# Patient Record
Sex: Female | Born: 1940
Health system: Southern US, Community
[De-identification: ages and names within clinical notes are randomized; demographics above are authoritative.]

## PROBLEM LIST (undated history)

## (undated) DIAGNOSIS — Z923 Personal history of irradiation: Secondary | ICD-10-CM

## (undated) DIAGNOSIS — K589 Irritable bowel syndrome without diarrhea: Secondary | ICD-10-CM

## (undated) DIAGNOSIS — I517 Cardiomegaly: Secondary | ICD-10-CM

## (undated) DIAGNOSIS — I8390 Asymptomatic varicose veins of unspecified lower extremity: Secondary | ICD-10-CM

## (undated) DIAGNOSIS — K449 Diaphragmatic hernia without obstruction or gangrene: Secondary | ICD-10-CM

## (undated) DIAGNOSIS — N182 Chronic kidney disease, stage 2 (mild): Secondary | ICD-10-CM

## (undated) DIAGNOSIS — K219 Gastro-esophageal reflux disease without esophagitis: Secondary | ICD-10-CM

## (undated) DIAGNOSIS — M542 Cervicalgia: Secondary | ICD-10-CM

## (undated) DIAGNOSIS — I493 Ventricular premature depolarization: Secondary | ICD-10-CM

## (undated) DIAGNOSIS — G20 Parkinson's disease: Secondary | ICD-10-CM

## (undated) DIAGNOSIS — Z853 Personal history of malignant neoplasm of breast: Secondary | ICD-10-CM

## (undated) DIAGNOSIS — G8929 Other chronic pain: Secondary | ICD-10-CM

## (undated) DIAGNOSIS — Z86010 Personal history of colonic polyps: Secondary | ICD-10-CM

## (undated) DIAGNOSIS — I447 Left bundle-branch block, unspecified: Secondary | ICD-10-CM

## (undated) DIAGNOSIS — N393 Stress incontinence (female) (male): Secondary | ICD-10-CM

## (undated) DIAGNOSIS — Z9889 Other specified postprocedural states: Secondary | ICD-10-CM

## (undated) DIAGNOSIS — K5730 Diverticulosis of large intestine without perforation or abscess without bleeding: Secondary | ICD-10-CM

## (undated) DIAGNOSIS — R7989 Other specified abnormal findings of blood chemistry: Secondary | ICD-10-CM

## (undated) DIAGNOSIS — C801 Malignant (primary) neoplasm, unspecified: Secondary | ICD-10-CM

## (undated) DIAGNOSIS — Z9181 History of falling: Secondary | ICD-10-CM

## (undated) DIAGNOSIS — Z87442 Personal history of urinary calculi: Secondary | ICD-10-CM

## (undated) DIAGNOSIS — I10 Essential (primary) hypertension: Secondary | ICD-10-CM

## (undated) DIAGNOSIS — I480 Paroxysmal atrial fibrillation: Secondary | ICD-10-CM

## (undated) DIAGNOSIS — M47812 Spondylosis without myelopathy or radiculopathy, cervical region: Secondary | ICD-10-CM

## (undated) DIAGNOSIS — R112 Nausea with vomiting, unspecified: Secondary | ICD-10-CM

## (undated) DIAGNOSIS — H8110 Benign paroxysmal vertigo, unspecified ear: Secondary | ICD-10-CM

## (undated) DIAGNOSIS — Z9221 Personal history of antineoplastic chemotherapy: Secondary | ICD-10-CM

## (undated) DIAGNOSIS — Z8619 Personal history of other infectious and parasitic diseases: Secondary | ICD-10-CM

## (undated) DIAGNOSIS — Z8679 Personal history of other diseases of the circulatory system: Secondary | ICD-10-CM

## (undated) DIAGNOSIS — G20C Parkinsonism, unspecified: Secondary | ICD-10-CM

## (undated) DIAGNOSIS — Z952 Presence of prosthetic heart valve: Secondary | ICD-10-CM

## (undated) DIAGNOSIS — R778 Other specified abnormalities of plasma proteins: Secondary | ICD-10-CM

## (undated) DIAGNOSIS — N201 Calculus of ureter: Secondary | ICD-10-CM

## (undated) DIAGNOSIS — D649 Anemia, unspecified: Secondary | ICD-10-CM

## (undated) DIAGNOSIS — E785 Hyperlipidemia, unspecified: Secondary | ICD-10-CM

## (undated) HISTORY — DX: Gastro-esophageal reflux disease without esophagitis: K21.9

## (undated) HISTORY — DX: Other specified abnormalities of plasma proteins: R77.8

## (undated) HISTORY — PX: CARDIOVASCULAR STRESS TEST: SHX262

## (undated) HISTORY — DX: Irritable bowel syndrome, unspecified: K58.9

## (undated) HISTORY — DX: Other specified abnormal findings of blood chemistry: R79.89

## (undated) HISTORY — PX: BREAST EXCISIONAL BIOPSY: SUR124

## (undated) HISTORY — DX: Essential (primary) hypertension: I10

## (undated) HISTORY — DX: Asymptomatic varicose veins of unspecified lower extremity: I83.90

## (undated) HISTORY — PX: CARDIAC CATHETERIZATION: SHX172

## (undated) HISTORY — PX: TRANSTHORACIC ECHOCARDIOGRAM: SHX275

## (undated) HISTORY — DX: Personal history of urinary calculi: Z87.442

## (undated) HISTORY — PX: AORTIC VALVE REPLACEMENT: SHX41

## (undated) HISTORY — DX: Cardiomegaly: I51.7

## (undated) HISTORY — PX: APPENDECTOMY: SHX54

## (undated) HISTORY — PX: CARPAL TUNNEL RELEASE: SHX101

## (undated) HISTORY — DX: Ventricular premature depolarization: I49.3

---

## 1969-12-27 HISTORY — PX: TOTAL ABDOMINAL HYSTERECTOMY: SHX209

## 1988-12-27 HISTORY — PX: ANTERIOR AND POSTERIOR REPAIR: SHX1172

## 1991-12-28 HISTORY — PX: PERCUTANEOUS NEPHROSTOLITHOTOMY: SHX2207

## 1995-12-28 HISTORY — PX: EXTRACORPOREAL SHOCK WAVE LITHOTRIPSY: SHX1557

## 1998-09-24 ENCOUNTER — Other Ambulatory Visit: Admission: RE | Admit: 1998-09-24 | Discharge: 1998-09-24 | Payer: Self-pay | Admitting: Obstetrics and Gynecology

## 1999-10-05 ENCOUNTER — Other Ambulatory Visit: Admission: RE | Admit: 1999-10-05 | Discharge: 1999-10-05 | Payer: Self-pay | Admitting: Obstetrics and Gynecology

## 2000-10-31 ENCOUNTER — Other Ambulatory Visit: Admission: RE | Admit: 2000-10-31 | Discharge: 2000-10-31 | Payer: Self-pay | Admitting: Obstetrics and Gynecology

## 2000-11-22 ENCOUNTER — Encounter: Admission: RE | Admit: 2000-11-22 | Discharge: 2000-11-22 | Payer: Self-pay | Admitting: *Deleted

## 2001-02-21 ENCOUNTER — Encounter: Payer: Self-pay | Admitting: Emergency Medicine

## 2001-02-21 ENCOUNTER — Encounter: Payer: Self-pay | Admitting: Cardiology

## 2001-02-21 ENCOUNTER — Inpatient Hospital Stay (HOSPITAL_COMMUNITY): Admission: EM | Admit: 2001-02-21 | Discharge: 2001-02-22 | Payer: Self-pay | Admitting: Emergency Medicine

## 2001-05-01 ENCOUNTER — Encounter: Payer: Self-pay | Admitting: Obstetrics and Gynecology

## 2001-05-01 ENCOUNTER — Encounter: Admission: RE | Admit: 2001-05-01 | Discharge: 2001-05-01 | Payer: Self-pay | Admitting: General Surgery

## 2001-11-06 ENCOUNTER — Other Ambulatory Visit: Admission: RE | Admit: 2001-11-06 | Discharge: 2001-11-06 | Payer: Self-pay | Admitting: Obstetrics and Gynecology

## 2002-12-26 ENCOUNTER — Other Ambulatory Visit: Admission: RE | Admit: 2002-12-26 | Discharge: 2002-12-26 | Payer: Self-pay | Admitting: Obstetrics and Gynecology

## 2002-12-27 DIAGNOSIS — Z9221 Personal history of antineoplastic chemotherapy: Secondary | ICD-10-CM

## 2002-12-27 DIAGNOSIS — C801 Malignant (primary) neoplasm, unspecified: Secondary | ICD-10-CM

## 2002-12-27 DIAGNOSIS — Z923 Personal history of irradiation: Secondary | ICD-10-CM

## 2002-12-27 HISTORY — DX: Malignant (primary) neoplasm, unspecified: C80.1

## 2002-12-27 HISTORY — DX: Personal history of irradiation: Z92.3

## 2002-12-27 HISTORY — PX: BREAST SURGERY: SHX581

## 2002-12-27 HISTORY — DX: Personal history of antineoplastic chemotherapy: Z92.21

## 2002-12-31 ENCOUNTER — Encounter (INDEPENDENT_AMBULATORY_CARE_PROVIDER_SITE_OTHER): Payer: Self-pay | Admitting: *Deleted

## 2002-12-31 ENCOUNTER — Encounter: Admission: RE | Admit: 2002-12-31 | Discharge: 2002-12-31 | Payer: Self-pay | Admitting: Obstetrics and Gynecology

## 2002-12-31 ENCOUNTER — Encounter: Payer: Self-pay | Admitting: Obstetrics and Gynecology

## 2003-01-11 ENCOUNTER — Encounter (INDEPENDENT_AMBULATORY_CARE_PROVIDER_SITE_OTHER): Payer: Self-pay | Admitting: Specialist

## 2003-01-11 ENCOUNTER — Ambulatory Visit (HOSPITAL_BASED_OUTPATIENT_CLINIC_OR_DEPARTMENT_OTHER): Admission: RE | Admit: 2003-01-11 | Discharge: 2003-01-11 | Payer: Self-pay | Admitting: *Deleted

## 2003-01-11 HISTORY — PX: BREAST LUMPECTOMY: SHX2

## 2003-01-17 ENCOUNTER — Ambulatory Visit: Admission: RE | Admit: 2003-01-17 | Discharge: 2003-02-12 | Payer: Self-pay | Admitting: Radiation Oncology

## 2003-01-25 ENCOUNTER — Encounter: Payer: Self-pay | Admitting: Oncology

## 2003-01-25 ENCOUNTER — Ambulatory Visit (HOSPITAL_COMMUNITY): Admission: RE | Admit: 2003-01-25 | Discharge: 2003-01-25 | Payer: Self-pay | Admitting: Oncology

## 2003-01-30 ENCOUNTER — Ambulatory Visit: Admission: RE | Admit: 2003-01-30 | Discharge: 2003-01-30 | Payer: Self-pay | Admitting: Oncology

## 2003-01-30 ENCOUNTER — Encounter: Payer: Self-pay | Admitting: Cardiology

## 2003-02-04 ENCOUNTER — Ambulatory Visit (HOSPITAL_BASED_OUTPATIENT_CLINIC_OR_DEPARTMENT_OTHER): Admission: RE | Admit: 2003-02-04 | Discharge: 2003-02-04 | Payer: Self-pay | Admitting: *Deleted

## 2003-03-29 ENCOUNTER — Inpatient Hospital Stay (HOSPITAL_COMMUNITY): Admission: EM | Admit: 2003-03-29 | Discharge: 2003-04-02 | Payer: Self-pay | Admitting: Oncology

## 2003-03-29 ENCOUNTER — Encounter: Payer: Self-pay | Admitting: Oncology

## 2003-04-22 ENCOUNTER — Ambulatory Visit: Admission: RE | Admit: 2003-04-22 | Discharge: 2003-06-26 | Payer: Self-pay | Admitting: Radiation Oncology

## 2003-05-29 ENCOUNTER — Ambulatory Visit (HOSPITAL_BASED_OUTPATIENT_CLINIC_OR_DEPARTMENT_OTHER): Admission: RE | Admit: 2003-05-29 | Discharge: 2003-05-29 | Payer: Self-pay | Admitting: *Deleted

## 2003-07-29 ENCOUNTER — Ambulatory Visit: Admission: RE | Admit: 2003-07-29 | Discharge: 2003-07-29 | Payer: Self-pay | Admitting: Radiation Oncology

## 2003-07-30 ENCOUNTER — Encounter: Admission: RE | Admit: 2003-07-30 | Discharge: 2003-07-30 | Payer: Self-pay | Admitting: Oncology

## 2003-07-30 ENCOUNTER — Encounter: Payer: Self-pay | Admitting: Oncology

## 2003-09-05 ENCOUNTER — Encounter: Admission: RE | Admit: 2003-09-05 | Discharge: 2003-09-05 | Payer: Self-pay | Admitting: *Deleted

## 2003-11-27 ENCOUNTER — Encounter: Admission: RE | Admit: 2003-11-27 | Discharge: 2003-11-27 | Payer: Self-pay | Admitting: Oncology

## 2004-01-02 ENCOUNTER — Other Ambulatory Visit: Admission: RE | Admit: 2004-01-02 | Discharge: 2004-01-02 | Payer: Self-pay | Admitting: Obstetrics and Gynecology

## 2004-02-21 ENCOUNTER — Encounter (INDEPENDENT_AMBULATORY_CARE_PROVIDER_SITE_OTHER): Payer: Self-pay | Admitting: Specialist

## 2004-02-21 ENCOUNTER — Ambulatory Visit (HOSPITAL_COMMUNITY): Admission: RE | Admit: 2004-02-21 | Discharge: 2004-02-21 | Payer: Self-pay | Admitting: *Deleted

## 2004-02-21 ENCOUNTER — Ambulatory Visit (HOSPITAL_BASED_OUTPATIENT_CLINIC_OR_DEPARTMENT_OTHER): Admission: RE | Admit: 2004-02-21 | Discharge: 2004-02-21 | Payer: Self-pay | Admitting: *Deleted

## 2004-09-09 ENCOUNTER — Ambulatory Visit (HOSPITAL_COMMUNITY): Admission: RE | Admit: 2004-09-09 | Discharge: 2004-09-09 | Payer: Self-pay | Admitting: Orthopedic Surgery

## 2004-09-09 ENCOUNTER — Ambulatory Visit: Admission: RE | Admit: 2004-09-09 | Discharge: 2004-09-09 | Payer: Self-pay | Admitting: Orthopedic Surgery

## 2004-10-26 ENCOUNTER — Ambulatory Visit: Payer: Self-pay | Admitting: Oncology

## 2004-10-27 ENCOUNTER — Ambulatory Visit: Payer: Self-pay | Admitting: Oncology

## 2004-12-08 ENCOUNTER — Ambulatory Visit (HOSPITAL_COMMUNITY): Admission: RE | Admit: 2004-12-08 | Discharge: 2004-12-08 | Payer: Self-pay | Admitting: Oncology

## 2004-12-24 ENCOUNTER — Encounter: Admission: RE | Admit: 2004-12-24 | Discharge: 2004-12-24 | Payer: Self-pay | Admitting: Oncology

## 2005-02-02 ENCOUNTER — Ambulatory Visit: Payer: Self-pay | Admitting: Gastroenterology

## 2005-02-10 ENCOUNTER — Ambulatory Visit: Payer: Self-pay | Admitting: Gastroenterology

## 2005-05-04 ENCOUNTER — Ambulatory Visit: Payer: Self-pay | Admitting: Oncology

## 2005-07-14 ENCOUNTER — Encounter: Admission: RE | Admit: 2005-07-14 | Discharge: 2005-07-14 | Payer: Self-pay | Admitting: Internal Medicine

## 2005-11-01 ENCOUNTER — Ambulatory Visit: Payer: Self-pay | Admitting: Oncology

## 2005-12-28 ENCOUNTER — Encounter: Admission: RE | Admit: 2005-12-28 | Discharge: 2005-12-28 | Payer: Self-pay | Admitting: Oncology

## 2006-03-22 ENCOUNTER — Inpatient Hospital Stay (HOSPITAL_BASED_OUTPATIENT_CLINIC_OR_DEPARTMENT_OTHER): Admission: RE | Admit: 2006-03-22 | Discharge: 2006-03-22 | Payer: Self-pay | Admitting: Cardiology

## 2006-03-24 ENCOUNTER — Other Ambulatory Visit: Admission: RE | Admit: 2006-03-24 | Discharge: 2006-03-24 | Payer: Self-pay | Admitting: Obstetrics and Gynecology

## 2006-04-07 ENCOUNTER — Encounter: Admission: RE | Admit: 2006-04-07 | Discharge: 2006-04-07 | Payer: Self-pay | Admitting: Cardiothoracic Surgery

## 2006-04-13 ENCOUNTER — Encounter (INDEPENDENT_AMBULATORY_CARE_PROVIDER_SITE_OTHER): Payer: Self-pay | Admitting: *Deleted

## 2006-04-13 ENCOUNTER — Inpatient Hospital Stay (HOSPITAL_COMMUNITY): Admission: RE | Admit: 2006-04-13 | Discharge: 2006-04-21 | Payer: Self-pay | Admitting: Cardiothoracic Surgery

## 2006-04-29 ENCOUNTER — Ambulatory Visit: Payer: Self-pay | Admitting: Oncology

## 2006-05-12 ENCOUNTER — Encounter (HOSPITAL_COMMUNITY): Admission: RE | Admit: 2006-05-12 | Discharge: 2006-08-10 | Payer: Self-pay | Admitting: Cardiology

## 2006-07-11 ENCOUNTER — Emergency Department (HOSPITAL_COMMUNITY): Admission: EM | Admit: 2006-07-11 | Discharge: 2006-07-11 | Payer: Self-pay | Admitting: Emergency Medicine

## 2006-08-11 ENCOUNTER — Encounter (HOSPITAL_COMMUNITY): Admission: RE | Admit: 2006-08-11 | Discharge: 2006-09-23 | Payer: Self-pay | Admitting: Cardiology

## 2006-10-28 ENCOUNTER — Ambulatory Visit: Payer: Self-pay | Admitting: Oncology

## 2006-11-01 LAB — CBC WITH DIFFERENTIAL/PLATELET
BASO%: 0.9 % (ref 0.0–2.0)
Basophils Absolute: 0 10*3/uL (ref 0.0–0.1)
EOS%: 4.6 % (ref 0.0–7.0)
Eosinophils Absolute: 0.2 10*3/uL (ref 0.0–0.5)
HCT: 35 % (ref 34.8–46.6)
HGB: 11.9 g/dL (ref 11.6–15.9)
LYMPH%: 24.1 % (ref 14.0–48.0)
MCH: 35.1 pg — ABNORMAL HIGH (ref 26.0–34.0)
MCHC: 34.2 g/dL (ref 32.0–36.0)
MCV: 102.7 fL — ABNORMAL HIGH (ref 81.0–101.0)
MONO#: 0.4 10*3/uL (ref 0.1–0.9)
MONO%: 9 % (ref 0.0–13.0)
NEUT#: 2.7 10*3/uL (ref 1.5–6.5)
NEUT%: 61.4 % (ref 39.6–76.8)
Platelets: 192 10*3/uL (ref 145–400)
RBC: 3.4 10*6/uL — ABNORMAL LOW (ref 3.70–5.32)
RDW: 13.7 % (ref 11.3–14.5)
WBC: 4.4 10*3/uL (ref 3.9–10.0)
lymph#: 1.1 10*3/uL (ref 0.9–3.3)

## 2006-11-01 LAB — COMPREHENSIVE METABOLIC PANEL
ALT: 40 U/L — ABNORMAL HIGH (ref 0–35)
AST: 43 U/L — ABNORMAL HIGH (ref 0–37)
Albumin: 3.8 g/dL (ref 3.5–5.2)
Alkaline Phosphatase: 73 U/L (ref 39–117)
BUN: 20 mg/dL (ref 6–23)
CO2: 29 mEq/L (ref 19–32)
Calcium: 8.8 mg/dL (ref 8.4–10.5)
Chloride: 105 mEq/L (ref 96–112)
Creatinine, Ser: 1.29 mg/dL — ABNORMAL HIGH (ref 0.40–1.20)
Glucose, Bld: 94 mg/dL (ref 70–99)
Potassium: 3.9 mEq/L (ref 3.5–5.3)
Sodium: 141 mEq/L (ref 135–145)
Total Bilirubin: 0.5 mg/dL (ref 0.3–1.2)
Total Protein: 6.2 g/dL (ref 6.0–8.3)

## 2006-11-01 LAB — CANCER ANTIGEN 27.29: CA 27.29: 19 U/mL (ref 0–39)

## 2007-01-02 ENCOUNTER — Encounter: Admission: RE | Admit: 2007-01-02 | Discharge: 2007-01-02 | Payer: Self-pay | Admitting: Oncology

## 2007-04-06 ENCOUNTER — Encounter: Admission: RE | Admit: 2007-04-06 | Discharge: 2007-04-06 | Payer: Self-pay | Admitting: Internal Medicine

## 2007-04-06 ENCOUNTER — Encounter (INDEPENDENT_AMBULATORY_CARE_PROVIDER_SITE_OTHER): Payer: Self-pay | Admitting: Specialist

## 2007-04-20 ENCOUNTER — Encounter: Admission: RE | Admit: 2007-04-20 | Discharge: 2007-04-20 | Payer: Self-pay | Admitting: *Deleted

## 2007-04-24 ENCOUNTER — Ambulatory Visit: Payer: Self-pay | Admitting: Oncology

## 2007-04-27 LAB — CANCER ANTIGEN 27.29: CA 27.29: 22 U/mL (ref 0–39)

## 2007-04-27 LAB — COMPREHENSIVE METABOLIC PANEL
ALT: 17 U/L (ref 0–35)
AST: 21 U/L (ref 0–37)
Albumin: 3.9 g/dL (ref 3.5–5.2)
Alkaline Phosphatase: 76 U/L (ref 39–117)
BUN: 27 mg/dL — ABNORMAL HIGH (ref 6–23)
CO2: 27 mEq/L (ref 19–32)
Calcium: 9.1 mg/dL (ref 8.4–10.5)
Chloride: 102 mEq/L (ref 96–112)
Creatinine, Ser: 1.07 mg/dL (ref 0.40–1.20)
Glucose, Bld: 84 mg/dL (ref 70–99)
Potassium: 3.9 mEq/L (ref 3.5–5.3)
Sodium: 137 mEq/L (ref 135–145)
Total Bilirubin: 0.5 mg/dL (ref 0.3–1.2)
Total Protein: 6.3 g/dL (ref 6.0–8.3)

## 2007-04-27 LAB — CBC WITH DIFFERENTIAL/PLATELET
BASO%: 0.6 % (ref 0.0–2.0)
Basophils Absolute: 0 10*3/uL (ref 0.0–0.1)
EOS%: 2.3 % (ref 0.0–7.0)
Eosinophils Absolute: 0.1 10*3/uL (ref 0.0–0.5)
HCT: 34.1 % — ABNORMAL LOW (ref 34.8–46.6)
HGB: 12 g/dL (ref 11.6–15.9)
LYMPH%: 25.8 % (ref 14.0–48.0)
MCH: 35.4 pg — ABNORMAL HIGH (ref 26.0–34.0)
MCHC: 35.2 g/dL (ref 32.0–36.0)
MCV: 100.5 fL (ref 81.0–101.0)
MONO#: 0.4 10*3/uL (ref 0.1–0.9)
MONO%: 9 % (ref 0.0–13.0)
NEUT#: 2.7 10*3/uL (ref 1.5–6.5)
NEUT%: 62.3 % (ref 39.6–76.8)
Platelets: 179 10*3/uL (ref 145–400)
RBC: 3.4 10*6/uL — ABNORMAL LOW (ref 3.70–5.32)
RDW: 14 % (ref 11.3–14.5)
WBC: 4.4 10*3/uL (ref 3.9–10.0)
lymph#: 1.1 10*3/uL (ref 0.9–3.3)

## 2007-10-24 ENCOUNTER — Ambulatory Visit: Payer: Self-pay | Admitting: Oncology

## 2007-10-26 LAB — CBC WITH DIFFERENTIAL/PLATELET
BASO%: 1.3 % (ref 0.0–2.0)
Basophils Absolute: 0.1 10*3/uL (ref 0.0–0.1)
EOS%: 5.7 % (ref 0.0–7.0)
Eosinophils Absolute: 0.2 10*3/uL (ref 0.0–0.5)
HCT: 33.9 % — ABNORMAL LOW (ref 34.8–46.6)
HGB: 12.1 g/dL (ref 11.6–15.9)
LYMPH%: 18.7 % (ref 14.0–48.0)
MCH: 35.6 pg — ABNORMAL HIGH (ref 26.0–34.0)
MCHC: 35.9 g/dL (ref 32.0–36.0)
MCV: 99.3 fL (ref 81.0–101.0)
MONO#: 0.3 10*3/uL (ref 0.1–0.9)
MONO%: 8 % (ref 0.0–13.0)
NEUT#: 2.6 10*3/uL (ref 1.5–6.5)
NEUT%: 66.3 % (ref 39.6–76.8)
Platelets: 175 10*3/uL (ref 145–400)
RBC: 3.41 10*6/uL — ABNORMAL LOW (ref 3.70–5.32)
RDW: 13.5 % (ref 11.3–14.5)
WBC: 3.9 10*3/uL (ref 3.9–10.0)
lymph#: 0.7 10*3/uL — ABNORMAL LOW (ref 0.9–3.3)

## 2007-10-27 LAB — COMPREHENSIVE METABOLIC PANEL
ALT: 18 U/L (ref 0–35)
AST: 25 U/L (ref 0–37)
Albumin: 4.1 g/dL (ref 3.5–5.2)
Alkaline Phosphatase: 70 U/L (ref 39–117)
BUN: 24 mg/dL — ABNORMAL HIGH (ref 6–23)
CO2: 24 mEq/L (ref 19–32)
Calcium: 9.4 mg/dL (ref 8.4–10.5)
Chloride: 101 mEq/L (ref 96–112)
Creatinine, Ser: 1.17 mg/dL (ref 0.40–1.20)
Glucose, Bld: 78 mg/dL (ref 70–99)
Potassium: 3.9 mEq/L (ref 3.5–5.3)
Sodium: 137 mEq/L (ref 135–145)
Total Bilirubin: 0.6 mg/dL (ref 0.3–1.2)
Total Protein: 6.5 g/dL (ref 6.0–8.3)

## 2007-10-27 LAB — CANCER ANTIGEN 27.29: CA 27.29: 23 U/mL (ref 0–39)

## 2007-10-27 LAB — VITAMIN B12: Vitamin B-12: 611 pg/mL (ref 211–911)

## 2007-10-27 LAB — FOLATE RBC: RBC Folate: 917 ng/mL — ABNORMAL HIGH (ref 180–600)

## 2007-11-16 ENCOUNTER — Encounter: Payer: Self-pay | Admitting: Internal Medicine

## 2008-03-04 ENCOUNTER — Ambulatory Visit: Payer: Self-pay | Admitting: Oncology

## 2008-03-06 ENCOUNTER — Ambulatory Visit (HOSPITAL_COMMUNITY): Admission: RE | Admit: 2008-03-06 | Discharge: 2008-03-06 | Payer: Self-pay | Admitting: Oncology

## 2008-03-06 LAB — COMPREHENSIVE METABOLIC PANEL
ALT: 19 U/L (ref 0–35)
AST: 26 U/L (ref 0–37)
Albumin: 3.5 g/dL (ref 3.5–5.2)
Alkaline Phosphatase: 57 U/L (ref 39–117)
BUN: 15 mg/dL (ref 6–23)
CO2: 30 mEq/L (ref 19–32)
Calcium: 9.4 mg/dL (ref 8.4–10.5)
Chloride: 96 mEq/L (ref 96–112)
Creatinine, Ser: 1.07 mg/dL (ref 0.40–1.20)
Glucose, Bld: 88 mg/dL (ref 70–99)
Potassium: 4.2 mEq/L (ref 3.5–5.3)
Sodium: 132 mEq/L — ABNORMAL LOW (ref 135–145)
Total Bilirubin: 1.1 mg/dL (ref 0.3–1.2)
Total Protein: 6.2 g/dL (ref 6.0–8.3)

## 2008-03-06 LAB — CBC WITH DIFFERENTIAL/PLATELET
BASO%: 0.5 % (ref 0.0–2.0)
Basophils Absolute: 0 10*3/uL (ref 0.0–0.1)
EOS%: 2.4 % (ref 0.0–7.0)
Eosinophils Absolute: 0.1 10*3/uL (ref 0.0–0.5)
HCT: 35.6 % (ref 34.8–46.6)
HGB: 12.5 g/dL (ref 11.6–15.9)
LYMPH%: 18.1 % (ref 14.0–48.0)
MCH: 34.5 pg — ABNORMAL HIGH (ref 26.0–34.0)
MCHC: 35.1 g/dL (ref 32.0–36.0)
MCV: 98.3 fL (ref 81.0–101.0)
MONO#: 0.4 10*3/uL (ref 0.1–0.9)
MONO%: 8 % (ref 0.0–13.0)
NEUT#: 3.4 10*3/uL (ref 1.5–6.5)
NEUT%: 71 % (ref 39.6–76.8)
Platelets: 164 10*3/uL (ref 145–400)
RBC: 3.62 10*6/uL — ABNORMAL LOW (ref 3.70–5.32)
RDW: 12.8 % (ref 11.3–14.5)
WBC: 4.8 10*3/uL (ref 3.9–10.0)
lymph#: 0.9 10*3/uL (ref 0.9–3.3)

## 2008-03-06 LAB — CANCER ANTIGEN 27.29: CA 27.29: 24 U/mL (ref 0–39)

## 2008-03-07 LAB — VITAMIN D 25 HYDROXY (VIT D DEFICIENCY, FRACTURES): Vit D, 25-Hydroxy: 32 ng/mL (ref 30–89)

## 2008-03-11 LAB — VITAMIN D 1,25 DIHYDROXY: Vit D, 1,25-Dihydroxy: 56 pg/mL (ref 6–62)

## 2008-04-17 ENCOUNTER — Encounter: Admission: RE | Admit: 2008-04-17 | Discharge: 2008-04-17 | Payer: Self-pay | Admitting: Oncology

## 2008-04-24 ENCOUNTER — Ambulatory Visit: Payer: Self-pay | Admitting: Oncology

## 2008-04-25 ENCOUNTER — Encounter: Admission: RE | Admit: 2008-04-25 | Discharge: 2008-04-25 | Payer: Self-pay | Admitting: Oncology

## 2008-10-08 ENCOUNTER — Encounter: Admission: RE | Admit: 2008-10-08 | Discharge: 2008-10-08 | Payer: Self-pay | Admitting: Oncology

## 2009-04-18 ENCOUNTER — Encounter: Admission: RE | Admit: 2009-04-18 | Discharge: 2009-04-18 | Payer: Self-pay | Admitting: *Deleted

## 2009-09-03 ENCOUNTER — Inpatient Hospital Stay (HOSPITAL_COMMUNITY): Admission: EM | Admit: 2009-09-03 | Discharge: 2009-09-04 | Payer: Self-pay | Admitting: Emergency Medicine

## 2010-01-02 ENCOUNTER — Encounter (INDEPENDENT_AMBULATORY_CARE_PROVIDER_SITE_OTHER): Payer: Self-pay | Admitting: *Deleted

## 2010-02-06 ENCOUNTER — Encounter (INDEPENDENT_AMBULATORY_CARE_PROVIDER_SITE_OTHER): Payer: Self-pay | Admitting: *Deleted

## 2010-02-06 ENCOUNTER — Ambulatory Visit: Payer: Self-pay | Admitting: Gastroenterology

## 2010-02-06 DIAGNOSIS — K5730 Diverticulosis of large intestine without perforation or abscess without bleeding: Secondary | ICD-10-CM | POA: Insufficient documentation

## 2010-02-06 DIAGNOSIS — Z860100 Personal history of colon polyps, unspecified: Secondary | ICD-10-CM | POA: Insufficient documentation

## 2010-02-06 DIAGNOSIS — K219 Gastro-esophageal reflux disease without esophagitis: Secondary | ICD-10-CM | POA: Insufficient documentation

## 2010-02-06 DIAGNOSIS — Z9010 Acquired absence of unspecified breast and nipple: Secondary | ICD-10-CM | POA: Insufficient documentation

## 2010-02-09 ENCOUNTER — Telehealth: Payer: Self-pay | Admitting: Gastroenterology

## 2010-02-20 ENCOUNTER — Ambulatory Visit: Payer: Self-pay | Admitting: Gastroenterology

## 2010-04-30 ENCOUNTER — Encounter: Admission: RE | Admit: 2010-04-30 | Discharge: 2010-04-30 | Payer: Self-pay | Admitting: Internal Medicine

## 2010-05-12 ENCOUNTER — Encounter: Admission: RE | Admit: 2010-05-12 | Discharge: 2010-05-12 | Payer: Self-pay | Admitting: General Surgery

## 2010-06-18 ENCOUNTER — Telehealth: Payer: Self-pay | Admitting: Gastroenterology

## 2010-08-11 ENCOUNTER — Ambulatory Visit: Payer: Self-pay | Admitting: Cardiology

## 2010-09-08 ENCOUNTER — Ambulatory Visit: Payer: Self-pay | Admitting: Cardiology

## 2010-09-15 ENCOUNTER — Ambulatory Visit: Payer: Self-pay | Admitting: Cardiology

## 2010-10-06 ENCOUNTER — Ambulatory Visit: Payer: Self-pay | Admitting: Cardiology

## 2010-11-03 ENCOUNTER — Ambulatory Visit: Payer: Self-pay | Admitting: Cardiovascular Disease

## 2010-11-06 ENCOUNTER — Ambulatory Visit: Payer: Self-pay | Admitting: Cardiology

## 2010-11-13 ENCOUNTER — Ambulatory Visit: Payer: Self-pay | Admitting: Cardiology

## 2010-11-27 ENCOUNTER — Ambulatory Visit: Payer: Self-pay | Admitting: Cardiology

## 2010-12-01 ENCOUNTER — Ambulatory Visit: Payer: Self-pay | Admitting: Cardiovascular Disease

## 2010-12-08 ENCOUNTER — Ambulatory Visit: Payer: Self-pay | Admitting: Cardiology

## 2010-12-15 ENCOUNTER — Ambulatory Visit: Payer: Self-pay | Admitting: Cardiology

## 2010-12-23 ENCOUNTER — Ambulatory Visit: Payer: Self-pay | Admitting: Cardiology

## 2010-12-30 ENCOUNTER — Ambulatory Visit: Payer: Self-pay | Admitting: Cardiology

## 2011-01-07 ENCOUNTER — Ambulatory Visit: Payer: Self-pay | Admitting: Cardiology

## 2011-01-15 ENCOUNTER — Ambulatory Visit: Payer: Self-pay | Admitting: Cardiology

## 2011-01-17 ENCOUNTER — Encounter: Payer: Self-pay | Admitting: Oncology

## 2011-01-26 ENCOUNTER — Encounter: Payer: Self-pay | Admitting: Cardiology

## 2011-01-26 ENCOUNTER — Ambulatory Visit: Payer: Self-pay | Admitting: Cardiology

## 2011-01-28 NOTE — Procedures (Signed)
Summary: Colonoscopy  Patient: Cherelle Renner Note: All result statuses are Final unless otherwise noted.  Tests: (1) Colonoscopy (COL)   COL Colonoscopy           [facility]     [place], [place]  [address]           COLONOSCOPY PROCEDURE REPORT           PATIENT:  Laconya, Fuell  MR#:  [id number]     BIRTHDATE:  12-14-1941, 68 yrs. old  GENDER:  female           ENDOSCOPIST:  Loralee Pacas. Sharlett Iles, MD, [facility]     Referred by:           PROCEDURE DATE:  02/20/2010     PROCEDURE:     ASA CLASS:  Class III     INDICATIONS:  history of pre-cancerous (adenomatous) colon polyps                 MEDICATIONS:   Fentanyl 50 mcg IV, Versed 6 mg IV           DESCRIPTION OF PROCEDURE:   After the risks benefits and     alternatives of the procedure were thoroughly explained, informed     consent was obtained.  Digital rectal exam was performed and     revealed no abnormalities.   The LB [id number] endoscope     was introduced through the anus and advanced to the cecum, which     was identified by both the appendix and ileocecal valve, without     limitations.  The quality of the prep was excellent, using     MoviPrep.  The instrument was then slowly withdrawn as the colon     was fully examined.     <<PROCEDUREIMAGES>>           FINDINGS:  Severe diverticulosis was found sigmoid to descending     No polyps or cancers were seen.  Internal hemorrhoids were found.     Retroflexed views in the rectum revealed internal hemorrhoids.     The scope was then withdrawn from the patient and the procedure     completed.           COMPLICATIONS:  None           ENDOSCOPIC IMPRESSION:     1) Severe diverticulosis in the sigmoid to descending     2) No polyps or cancers     3) Internal hemorrhoids     RECOMMENDATIONS:     1) High fiber diet.     RESUME ALL MEDS AND COUMADIN.           REPEAT EXAM:  No           ______________________________  Loralee Pacas. Sharlett Iles, MD, Marval Regal           CC:  Peter Martinique, MDDaniel Philip Aspen, MD           n.     Lorrin Mais:   Loralee Pacas. Sachin Ferencz at 02/20/2010 02:03 PM           Danie Chandler, [id number]  Note: An exclamation mark (!) indicates a result that was not dispersed into the flowsheet. Document Creation Date: 02/20/2010 2:03 PM _______________________________________________________________________  (1) Order result status: Final Collection or observation date-time: 02/20/2010 13:57 Requested date-time:  Receipt date-time:  Reported date-time:  Referring Physician:   Ordering Physician:  Verl Blalock [id number]) Specimen Source:  Source: Tawanna Cooler Order Number: [id number] Lab site:   Appended Document: Colonoscopy This colonoscopy was screening for high risk patient for colon cancer and polyps.

## 2011-01-28 NOTE — Letter (Signed)
Summary: [place] [facility]//NEUROLOGY RESULT  [facility]//NEUROLOGY RESULT   Imported By: Velora Heckler 11/21/2007 10:52:02  _____________________________________________________________________  External Attachment:    Type:   Image     Comment:   External Document

## 2011-01-28 NOTE — Letter (Signed)
Summary: [facility] Instructions  [facility]  [place], [place] [address]   Phone: [phone]  Fax: [phone]       [name]    Apr 17, 1941    MRN: [id number]        Procedure Day /Date: Friday, 02/20/10     Arrival Time: 12:30      Procedure Time: 1:30     Location of Procedure:                    [facility]  [place] (4th Floor)                         [place]   Starting 5 days prior to your procedure 02/15/10 do not eat nuts, seeds, popcorn, corn, beans, peas,  salads, or any raw vegetables.  Do not take any fiber supplements (e.g. Metamucil, Citrucel, and Benefiber).  THE DAY BEFORE YOUR PROCEDURE         DATE: 02/19/10  DAY: Thursday  1.  Drink clear liquids the entire day-NO SOLID FOOD  2.  Do not drink anything colored red or purple.  Avoid juices with pulp.  No orange juice.  3.  Drink at least 64 oz. (8 glasses) of fluid/clear liquids during the day to prevent dehydration and help the prep work efficiently.  CLEAR LIQUIDS INCLUDE: Water Jello Ice Popsicles Tea (sugar ok, no milk/cream) Powdered fruit flavored drinks Coffee (sugar ok, no milk/cream) Gatorade Juice: apple, white grape, white cranberry  Lemonade Clear bullion, consomm, broth Carbonated beverages (any kind) Strained chicken noodle soup Hard Candy                             4.  In the morning, mix first dose of MoviPrep solution:    Empty 1 Pouch A and 1 Pouch B into the disposable container    Add lukewarm drinking water to the top line of the container. Mix to dissolve    Refrigerate (mixed solution should be used within 24 hrs)  5.  Begin drinking the prep at 5:00 p.m. The MoviPrep container is divided by 4 marks.   Every 15 minutes drink the solution down to the next mark (approximately 8 oz) until the full liter is complete.   6.  Follow completed prep with 16 oz of clear liquid of your choice (Nothing  red or purple).  Continue to drink clear liquids until bedtime.  7.  Before going to bed, mix second dose of MoviPrep solution:    Empty 1 Pouch A and 1 Pouch B into the disposable container    Add lukewarm drinking water to the top line of the container. Mix to dissolve    Refrigerate  THE DAY OF YOUR PROCEDURE      DATE: 02/20/10  DAY: Friday  Beginning at 8:30 a.m. (5 hours before procedure):         1. Every 15 minutes, drink the solution down to the next mark (approx 8 oz) until the full liter is complete.  2. Follow completed prep with 16 oz. of clear liquid of your choice.    3. You may drink clear liquids until 11:30  (2 HOURS BEFORE PROCEDURE).   MEDICATION INSTRUCTIONS  Unless otherwise instructed, you should take regular prescription medications with a small sip of water   as early as possible the morning  of your procedure.   CHECK WITH DR. Martinique ABOUT COUMADIN          OTHER INSTRUCTIONS  You will need a responsible adult at least 71 years of age to accompany you and drive you home.   This person must remain in the waiting room during your procedure.  Wear loose fitting clothing that is easily removed.  Leave jewelry and other valuables at home.  However, you may wish to bring a book to read or  an iPod/MP3 player to listen to music as you wait for your procedure to start.  Remove all body piercing jewelry and leave at home.  Total time from sign-in until discharge is approximately 2-3 hours.  You should go home directly after your procedure and rest.  You can resume normal activities the  day after your procedure.  The day of your procedure you should not:   Drive   Make legal decisions   Operate machinery   Drink alcohol   Return to work  You will receive specific instructions about eating, activities and medications before you leave.    The above instructions have been reviewed and explained to me by   _______________________    I  fully understand and can verbalize these instructions _____________________________ Date _________

## 2011-01-28 NOTE — Progress Notes (Signed)
Summary: COL alternative?  Phone Note Call from Patient Call back at Home Phone [phone]   Caller: Patient Call For: Dr. Sharlett Iles Reason for Call: Talk to Nurse Summary of Call: pt has COL on 25th and has questions regarding Coumadin and if there is a different type of test that can be performed to screen for colon cancer Initial call taken by: Lucien Mons,  February 09, 2010 1:31 PM  Follow-up for Phone Call        Pt states that her husband is concerned about her having to take the lovenox injections and wants to know if pt can have the "scan" that Dr.Dare Sanger mentioned.  Pt does not have this same concern but she promised her husband she would call and ask.  Explained to pt that colonoscopy is the best screening test and polyps can be removed at the same time.  Pt states she will go ahead with the colonoscopy.   To Dr. Francoise Schaumann Follow-up by: Alberteen Spindle RN,  February 09, 2010 2:08 PM  Additional Follow-up for Phone Call Additional follow up Details #1::        good... Additional Follow-up by: Sable Feil MD Marval Regal,  February 09, 2010 2:12 PM

## 2011-01-28 NOTE — Assessment & Plan Note (Signed)
Summary: CONSULT BEFORE COL PT ON COUMADINE.Marland KitchenEM   History of Present Illness Visit Type: Follow-up Visit Primary GI MD: Verl Blalock MD FACP [place] Primary Provider: Ermalene Searing. Philip Aspen, MD  Requesting Provider: n/a Chief Complaint: Consult colon. Pt denies any GI complaints  History of Present Illness:   This patient is a 70 year old Caucasian female who has had a mechanical valve placed in her aortic valve in 2007 because of advancing aortic stenosis. Since that time she's been on aspirin and Coumadin which had been managed by Dr. Peter Martinique. She has a very strong family history of colon cancer in 2 brothers younger than age 71. She has had several colonoscopies with the last exam 5 years ago showing a right-sided adenoma that was excised. She also has rather extensive diverticulosis with alternating diarrhea and constipation. She has chronic gastroesophageal reflux disease which is managed with daily protonic. Previous endoscopic exams have not shown evidence of Barrett's mucosa.She has well-controlled hypertension and hyperlipidemia. She denies active cardiovascular or pulmonary complaints. Her appetite is good and her weight is stable. She does have the bruisability but denies other coagulation problems. Apparently her INR was checked yesterday and was 2.5. She takes Coumadin 2.5 mg 3 days a week and 5 mg 4 days a week.   GI Review of Systems      Denies abdominal pain, acid reflux, belching, bloating, chest pain, dysphagia with liquids, dysphagia with solids, heartburn, loss of appetite, nausea, vomiting, vomiting blood, weight loss, and  weight gain.        Denies anal fissure, black tarry stools, change in bowel habit, constipation, diarrhea, diverticulosis, fecal incontinence, heme positive stool, hemorrhoids, irritable bowel syndrome, jaundice, light color stool, liver problems, rectal bleeding, and  rectal pain.    Current Medications (verified): 1)  Simvastatin 40 Mg Tabs  (Simvastatin) .Marland Kitchen.. 1 Qd 2)  Altace 10 Mg Caps (Ramipril) .... One Once Daily 3)  Warfarin Sodium 5 Mg Tabs (Warfarin Sodium) .... As Directed 4)  Amiodarone Hcl 50 Mg/ml Soln (Amiodarone Hcl) .Marland Kitchen.. 100 Mg Qd 5)  Hydrochlorothiazide 25 Mg Tabs (Hydrochlorothiazide) .... 1/2 Tab Qd 6)  Detrol La 4 Mg Xr24h-Cap (Tolterodine Tartrate) .Marland Kitchen.. 1 Qd 7)  Protonix 40 Mg Tbec (Pantoprazole Sodium) .Marland Kitchen.. 1 Qd 8)  Aspir-Low 81 Mg Tbec (Aspirin) .... Daily 9)  Vitamin D3 1000 Unit Tabs (Cholecalciferol) .... Two Times A Day 10)  Vitamin C 1000 Mg Tabs (Ascorbic Acid) .... Once Daily  Allergies (verified): 1)  ! Demerol 2)  ! * Ivp Dye  Past History:  Past medical, surgical, family and social histories (including risk factors) reviewed for relevance to current acute and chronic problems.  Past Medical History: Arrhythmia Atril FIb GERD Hyperlipidemia Hypertension Aortic Stenosis Colon Polyps Left Breast Cancer  Past Surgical History: Appendectomy Aortic Valve Replacement Breast-Mastectomy--Left  Hysterectomy Cystocele and Rectocele repair  Family History: Reviewed history from 02/04/2010 and no changes required. Family History of Heart Disease: Father, Mother, Brother Family History of Colon Cancer: Brothers x 2  Family History of Prostate Cancer:Father   Social History: Reviewed history from 02/04/2010 and no changes required. Occupation: Retired Married 3 childern Patient has never smoked.  Alcohol Use - no Illicit Drug Use - no Daily Caffeine Use: Occ  Drug Use:  no  Review of Systems       The patient complains of change in vision, cough, and swelling of feet/legs.  The patient denies allergy/sinus, anemia, anxiety-new, arthritis/joint pain, back pain, blood in urine, breast changes/lumps, confusion,  depression-new, fainting, fatigue, fever, headaches-new, hearing problems, heart murmur, heart rhythm changes, itching, menstrual pain, muscle pains/cramps, night sweats,  nosebleeds, pregnancy symptoms, shortness of breath, skin rash, sleeping problems, sore throat, swollen lymph glands, thirst - excessive , urination - excessive , urination changes/pain, urine leakage, vision changes, and voice change.   General:  Denies fever, chills, sweats, anorexia, fatigue, weakness, malaise, weight loss, and sleep disorder; previous mastectomy for breast cancer.. Eyes:  Complains of vision loss; denies blurring, diplopia, irritation, discharge, scotoma, eye pain, and photophobia. ENT:  Denies earache, ear discharge, tinnitus, decreased hearing, nasal congestion, loss of smell, nosebleeds, sore throat, hoarseness, and difficulty swallowing. CV:  Denies chest pains, angina, palpitations, syncope, dyspnea on exertion, orthopnea, PND, peripheral edema, and claudication. Resp:  Complains of cough; denies dyspnea at rest, dyspnea with exercise, sputum, wheezing, coughing up blood, and pleurisy. GI:  Denies difficulty swallowing, pain on swallowing, nausea, indigestion/heartburn, vomiting, vomiting blood, abdominal pain, jaundice, gas/bloating, diarrhea, constipation, change in bowel habits, bloody BM's, black BMs, and fecal incontinence; she becomes constipated when she travels and does not eat fiber foods. She is status post appendectomy.. GU:  she does have a history of kidney stones has had previous rectocele and cystocele repair. She takes Detrol LA 4 mg for bladder spasms.. MS:  Complains of joint stiffness; denies joint pain / LOM, joint swelling, joint deformity, low back pain, muscle weakness, muscle cramps, muscle atrophy, leg pain at night, leg pain with exertion, and shoulder pain / LOM hand / wrist pain (CTS); dependent lower extremity edema she does have varicosities of her lower extremities.. Derm:  Denies rash, itching, dry skin, hives, moles, warts, and unhealing ulcers. Neuro:  Denies weakness, paralysis, abnormal sensation, seizures, syncope, tremors, vertigo, transient  blindness, frequent falls, frequent headaches, difficulty walking, headache, sciatica, radiculopathy other:, restless legs, memory loss, and confusion. Psych:  Denies depression, anxiety, memory loss, suicidal ideation, hallucinations, paranoia, phobia, and confusion. Endo:  Denies cold intolerance, heat intolerance, polydipsia, polyphagia, polyuria, unusual weight change, and hirsutism. Heme:  Complains of bruising; denies bleeding, enlarged lymph nodes, and pagophagia.  Vital Signs:  Patient profile:   70 year old female Height:      61 inches Weight:      146 pounds BMI:     27.69 BSA:     1.65 Pulse rate:   60 / minute Pulse rhythm:   regular BP sitting:   118 / 74  (left arm) Cuff size:   regular  Vitals Entered By: Hope Pigeon CMA (February 06, 2010 8:10 AM)  Physical Exam  General:  Well developed, well nourished, no acute distress.healthy appearing.   Head:  Normocephalic and atraumatic. Eyes:  PERRLA, no icterus.exam deferred to patient's ophthalmologist.   Neck:  Supple; no masses or thyromegaly. Lungs:  Clear throughout to auscultation. Heart:  Regular rate and rhythm; no murmurs, rubs,  or bruits.Mechanical valve click noted. She appears be a regular rhythm without significant murmurs gallops or rubs. Abdomen:  Soft, nontender and nondistended. No masses, hepatosplenomegaly or hernias noted. Normal bowel sounds. Rectal:  deferred until time of colonoscopy.   Msk:  Symmetrical with no gross deformities. Normal posture. Pulses:  Normal pulses noted. Extremities:  No clubbing, cyanosis, edema or deformities noted.trace pedal edema.  Varicosities most marked in left thigh area. She does have deforming valgus changes of her feet Neurologic:  Alert and  oriented x4;  grossly normal neurologically. Cervical Nodes:  No significant cervical adenopathy. Inguinal Nodes:  No significant inguinal adenopathy.  Psych:  Alert and cooperative. Normal mood and affect.   Impression &  Recommendations:  Problem # 1:  PERSONAL HX COLONIC POLYPS (ICD-V12.72) Assessment Unchanged  She needs a followup colonoscopy but is on anticoagulation therapy because of her mechanical heart valve. Spoke with Dr. Peter Martinique her cardiologist today and we have planned for her to have her procedure after holding Coumadin for 5 days and covering her with appropriate Lovenox injections. She is to call Dr. Doug Sou office to coordinate her Lovenox with her procedure. There is 50% chance of recurrent adenomas in this particular case. Her risk for colon cancer and several times normal. She currently is asymptomatic except for some occasional hemorrhoidal bleeding.  Orders: Colonoscopy (Colon)  Problem # 2:  COUMADIN THERAPY (ICD-V58.61) Assessment: Unchanged  As per above. We will continue salicylate therapy throughout her colonoscopy exam.  Orders: Colonoscopy (Colon)  Problem # 3:  DIVERTICULOSIS-COLON (ICD-562.10) Assessment: Unchanged  High-Fiber Diet Reviewed with p.r.n. Benefiber use.  Orders: Colonoscopy (Colon)  Problem # 4:  GERD (ICD-530.81) Assessment: Improved Reflux Regime Reviewed and will continue daily PPI therapy.  Problem # 5:  ADENOCARCINOMA, COLON, FAMILY HX (ICD-V16.0) Assessment: Unchanged  Problem # 6:  AORTIC STENOSIS, MODERATE (ICD-424.1) Assessment: Improved status post mechanical valve replacement and September 2010. She currently is anticoagulated and is on multiple cardiac medications and is asymptomatic. Her hypertension is well controlled her blood pressure today is 118/74 and pulse is 60 and regular.  Problem # 7:  MASTECTOMY, HX OF (ICD-V45.71) Assessment: Comment Only  Patient Instructions: 1)  Copy sent to Dr. Peter Martinique and Dr. Leanna Battles  2)  Please continue current medications.  3)  Colonoscopy and Flexible Sigmoidoscopy brochure given.  4)  Conscious Sedation brochure given.  5)  Anticoagulation adjustments as per above. 6)   High-fiber diet as tolerated with p.r.n. Benefiber 7)  Continue reflux regime and daily Protonix. 8)  The medication list was reviewed and reconciled.  All changed / newly prescribed medications were explained.  A complete medication list was provided to the patient / caregiver. Prescriptions: MOVIPREP 100 GM  SOLR (PEG-KCL-NACL-NASULF-NA ASC-C) As per prep instructions.  #1 x 0   Entered by:   Alberteen Spindle RN   Authorized by:   Sable Feil MD [facility]   Signed by:   Alberteen Spindle RN on 02/06/2010   Method used:   Electronically to        [facility]. [id number]* (retail)       [address]       [place], [place]  [address]       Ph: [phone] or [phone]       Fax: [phone]   RxID:   [id number]

## 2011-01-28 NOTE — Letter (Signed)
Summary: Colonoscopy Letter  [facility]  [place], [place] [address]   Phone: [phone]  Fax: [phone]      January 02, 2010 MRN: [id number]   [place], [place]  [address]   Dear Ms. Ronnald Ramp,   According to your medical record, it is time for you to schedule a Colonoscopy. The American Cancer Society recommends this procedure as a method to detect early colon cancer. Patients with a family history of colon cancer, or a personal history of colon polyps or inflammatory bowel disease are at increased risk.  This letter has beeen generated based on the recommendations made at the time of your procedure. If you feel that in your particular situation this may no longer apply, please contact our office.  Please call our office at [phone] to schedule this appointment or to update your records at your earliest convenience.  Thank you for cooperating with [place] to provide you with the very best care possible.   Sincerely,  Loralee Pacas. Sharlett Iles, M.D.  [facility] [phone]

## 2011-01-28 NOTE — Procedures (Signed)
Summary: Colon   Colonoscopy  Procedure date:  02/10/2005  Findings:      Location:  [facility].   Patient Name: Orion, Overbaugh MRN: [id number] Procedure Procedures: Colonoscopy CPT: H7044205.    with polypectomy. CPT: S2983155.  Personnel: Endoscopist: Loralee Pacas. Sharlett Iles, MD.  Exam Location: Exam performed in Outpatient Clinic. Outpatient  Patient Consent: Procedure, Alternatives, Risks and Benefits discussed, consent obtained, from patient. Consent was obtained by the RN.  Indications  Increased Risk Screening: For family history of colorectal neoplasia, in  sibling age at onset: 42.  History  Current Medications: Patient is not currently taking Coumadin.  Allergies: Allergic to PCN, iodine.  Pre-Exam Physical: Performed Feb 10, 2005. Cardio-pulmonary exam, Rectal exam, Abdominal exam, Extremity exam, Mental status exam WNL.  Exam Exam: Extent of exam reached: Cecum, extent intended: Cecum.  The cecum was identified by appendiceal orifice and IC valve. Patient position: on left side. Duration of exam: 20 minutes. Colon retroflexion performed. Images taken. ASA Classification: I. Tolerance: excellent.  Monitoring: Pulse and BP monitoring, Oximetry used. Supplemental O2 given. at 2 Liters.  Colon Prep Used Golytely for colon prep. Prep results: excellent.  Sedation Meds: Patient assessed and found to be appropriate for moderate (conscious) sedation. Fentanyl 75 mcg. given IV. Versed 6 mg. given IV.  Instrument(s): CF 140L. Serial [id number].  Findings - DIVERTICULOSIS: Splenic Flexure to Sigmoid Colon. Not bleeding. ICD9: Diverticulosis, Colon: 562.10.  - POLYP: Cecum, Maximum size: 4 mm. sessile polyp. Procedure:  snare with cautery, removed, retrieved, Polyp sent to pathology. ICD9: Colon Polyps: 211.3.   Assessment  Diagnoses: 211.3: Colon Polyps.  562.10: Diverticulosis, Colon.   Events  Unplanned Interventions: No intervention was  required.  Plans  Post Exam Instructions: No aspirin or non-steroidal containing medications: 2 weeks.  Medication Plan: Await pathology. Continue current medications.  Patient Education: Patient given standard instructions for: Polyps. Diverticulosis. Patient instructed to get routine colonoscopy every 3 years.  Disposition: After procedure patient sent to recovery. After recovery patient sent home.  Scheduling/Referral: Follow-Up prn. Await pathology to schedule patient.   cc: Leanna Battles MD  This report was created from the original endoscopy report, which was reviewed and signed by the above listed endoscopist.

## 2011-01-28 NOTE — Progress Notes (Signed)
Summary: J-Code  Phone Note Other Incoming Call back at [phone]   Caller: [facility]  Reason for Call: Discuss billing issue Details for Reason: J-Code Summary of Call: Requesting the J-code (prescription/injectible) for the Lovenox, Dr. Sharlett Iles ordered it for her colonoscopy. Since this is a RX issue, I do not have a list Initial call taken by: Cora Daniels,  June 18, 2010 12:48 PM Call For: Dr Sharlett Iles  Initial call taken by: Cora Daniels,  June 18, 2010 12:46 PM  Follow-up for Phone Call        Lovenox was given by Dr. Martinique, cardioligist,  See OV note. Follow-up by: Alberteen Spindle RN,  June 18, 2010 2:05 PM  Additional Follow-up for Phone Call Additional follow up Details #1::        Lovanox was not given in our office.  Tried to call Leveda Anna but she has left for the day. Butch Penny Surface RN  June 18, 2010 4:06 PM  LM for Leveda Anna to call.   Alberteen Spindle RN  June 19, 2010 8:58 AM  LM for Leveda Anna to call.  Butch Penny Surface RN  June 22, 2010 9:49 AM  Leveda Anna did not return call. Additional Follow-up by: Alberteen Spindle RN,  June 24, 2010 11:14 AM

## 2011-02-16 ENCOUNTER — Encounter (INDEPENDENT_AMBULATORY_CARE_PROVIDER_SITE_OTHER): Payer: PRIVATE HEALTH INSURANCE

## 2011-02-16 DIAGNOSIS — Z7901 Long term (current) use of anticoagulants: Secondary | ICD-10-CM

## 2011-02-16 DIAGNOSIS — I359 Nonrheumatic aortic valve disorder, unspecified: Secondary | ICD-10-CM

## 2011-02-16 DIAGNOSIS — I4949 Other premature depolarization: Secondary | ICD-10-CM

## 2011-03-11 ENCOUNTER — Encounter: Payer: Self-pay | Admitting: Cardiology

## 2011-03-11 DIAGNOSIS — I10 Essential (primary) hypertension: Secondary | ICD-10-CM | POA: Insufficient documentation

## 2011-03-11 DIAGNOSIS — I4891 Unspecified atrial fibrillation: Secondary | ICD-10-CM | POA: Insufficient documentation

## 2011-03-11 DIAGNOSIS — E7800 Pure hypercholesterolemia, unspecified: Secondary | ICD-10-CM | POA: Insufficient documentation

## 2011-03-11 DIAGNOSIS — K219 Gastro-esophageal reflux disease without esophagitis: Secondary | ICD-10-CM | POA: Insufficient documentation

## 2011-03-11 DIAGNOSIS — Z952 Presence of prosthetic heart valve: Secondary | ICD-10-CM | POA: Insufficient documentation

## 2011-03-11 DIAGNOSIS — I493 Ventricular premature depolarization: Secondary | ICD-10-CM | POA: Insufficient documentation

## 2011-03-17 ENCOUNTER — Encounter: Payer: Self-pay | Admitting: Cardiology

## 2011-03-17 ENCOUNTER — Ambulatory Visit (INDEPENDENT_AMBULATORY_CARE_PROVIDER_SITE_OTHER): Payer: PRIVATE HEALTH INSURANCE | Admitting: Cardiology

## 2011-03-17 DIAGNOSIS — E7800 Pure hypercholesterolemia, unspecified: Secondary | ICD-10-CM

## 2011-03-17 DIAGNOSIS — I359 Nonrheumatic aortic valve disorder, unspecified: Secondary | ICD-10-CM

## 2011-03-17 DIAGNOSIS — Z7901 Long term (current) use of anticoagulants: Secondary | ICD-10-CM

## 2011-03-17 DIAGNOSIS — I4891 Unspecified atrial fibrillation: Secondary | ICD-10-CM

## 2011-03-17 DIAGNOSIS — I350 Nonrheumatic aortic (valve) stenosis: Secondary | ICD-10-CM

## 2011-03-17 DIAGNOSIS — I10 Essential (primary) hypertension: Secondary | ICD-10-CM

## 2011-03-17 LAB — POCT INR: INR: 2.7

## 2011-03-17 MED ORDER — HYDROCHLOROTHIAZIDE 25 MG PO TABS: 25.0000 mg | ORAL_TABLET | Freq: Every day | ORAL | Status: DC

## 2011-03-17 NOTE — Assessment & Plan Note (Signed)
BP severely elevated today. Continue Losartan. Add HCTZ 25 mg daily. Has follow up with Dr. Philip Aspen in 1 month with lab. Sodium restriction.

## 2011-03-17 NOTE — Assessment & Plan Note (Signed)
No recurrence on amiodarone. Continue.

## 2011-03-17 NOTE — Progress Notes (Signed)
HPI [name] is seen for six-month followup today. She complains that her feet have been swelling more. She also complains of a dry mouth at night. She did have some mild diarrhea this morning. She denies any chest pain or shortness of breath. She's had no orthopnea or dizziness. She denies any palpitations. Allergies  Allergen Reactions  . Meperidine Hcl     Current Outpatient Prescriptions on File Prior to Visit  Medication Sig Dispense Refill  . amiodarone (PACERONE) 100 MG tablet Take 100 mg by mouth daily.        . Ascorbic Acid (VITAMIN C) 1000 MG tablet Take 1,000 mg by mouth 3 (three) times daily.        Marland Kitchen aspirin 81 MG tablet Take 81 mg by mouth daily.        . Calcium Citrate-Vitamin D (CITRACAL + D PO) Take by mouth daily.        . Cholecalciferol (VITAMIN D3) 1000 UNITS tablet Take 1,000 Units by mouth daily.        . Cyanocobalamin (VITAMIN B-12) 2500 MCG SUBL Place under the tongue daily.        Marland Kitchen losartan (COZAAR) 100 MG tablet Take 100 mg by mouth daily.        . Multiple Vitamin (MULTIVITAMIN) tablet Take 1 tablet by mouth daily.        . pantoprazole (PROTONIX) 40 MG tablet Take 40 mg by mouth daily.        . simvastatin (ZOCOR) 40 MG tablet Take 40 mg by mouth at bedtime.        . tolterodine (DETROL LA) 4 MG 24 hr capsule Take 4 mg by mouth daily.        . Warfarin Sodium (COUMADIN PO) Take by mouth as directed.          Past Medical History  Diagnosis Date  . Aortic stenosis, severe   . HTN (hypertension)   . Hypercholesteremia   . PVC's (premature ventricular contractions)   . A-fib   . Venous varices    . GERD (gastroesophageal reflux disease)   . Breast ca     Past Surgical History  Procedure Date  . Aortic valve replacement 04/13/06    St. Jude mechanical conduit  . Total abdominal hysterectomy   . Cystocele repair   . Rectocele repair   . Appendectomy   . Kidney stone surgery     Family History  Problem Relation Age of Onset  . Heart disease  Mother   . Heart disease Father 67    CABG  . Other Brother     AVR    History   Social History  . Marital Status: Married    Spouse Name: N/A    Number of Children: 3  . Years of Education: N/A   Occupational History  . bakery/caterer Other    retired   Social History Main Topics  . Smoking status: Never Smoker   . Smokeless tobacco: Not on file  . Alcohol Use: No  . Drug Use: No  . Sexually Active: Not on file   Other Topics Concern  . Not on file   Social History Narrative  . No narrative on file    ROS The patient denies any heat or cold intolerance.  No weight gain or weight loss.  The patient denies headaches or blurry vision.  There is no cough or sputum production.  The patient denies dizziness.  There is no hematuria or hematochezia.  The patient denies any muscle aches or arthritis.  The patient denies any rash.  The patient denies frequent falling or instability.  There is no history of depression or anxiety.  All other systems were reviewed and are negative. PHYSICAL EXAM BP 184/100  Pulse 62  Ht 5\' 1"  (1.549 m)  Wt 142 lb (64.411 kg)  BMI 26.83 kg/m2 The patient is alert and oriented x 3.  The mood and affect are normal.  The skin is warm and dry.  Color is normal.  The HEENT exam reveals that the sclera are nonicteric.  The mucous membranes are moist.  The carotids are 2+ without bruits.  There is no thyromegaly.  There is no JVD.  The lungs are clear.  The chest wall is non tender.  The heart exam reveals a regular rate with a normal S1 and split S2.  She has a good mechanical aortic valve click.  The PMI is not displaced.   Abdominal exam reveals good bowel sounds.  There is no guarding or rebound.  There is no hepatosplenomegaly or tenderness.  There are no masses.  Exam of the legs reveal no clubbing or cyanosis.there is 1+ lower extremity edema.  The legs are without rashes.  The distal pulses are intact.  Cranial nerves II - XII are intact.  Motor and  sensory functions are intact.  The gait is normal. ASSESSMENT AND PLAN

## 2011-03-17 NOTE — Assessment & Plan Note (Signed)
S/p AVR with a St. Jude mechanical conduit in 2007. Continue Coumadin therapy. Routine SBE prophylaxis.

## 2011-03-17 NOTE — Assessment & Plan Note (Signed)
INR 2.7 today. Continue dose and recheck INR in 4 weeks

## 2011-03-17 NOTE — Assessment & Plan Note (Addendum)
Continue statin RX and heart healthy diet.

## 2011-04-02 LAB — POCT I-STAT, CHEM 8
BUN: 24 mg/dL — ABNORMAL HIGH (ref 6–23)
Calcium, Ion: 1.25 mmol/L (ref 1.12–1.32)
Chloride: 103 mEq/L (ref 96–112)
Creatinine, Ser: 1.4 mg/dL — ABNORMAL HIGH (ref 0.4–1.2)
Glucose, Bld: 103 mg/dL — ABNORMAL HIGH (ref 70–99)
HCT: 37 % (ref 36.0–46.0)
Hemoglobin: 12.6 g/dL (ref 12.0–15.0)
Potassium: 3.9 mEq/L (ref 3.5–5.1)
Sodium: 137 mEq/L (ref 135–145)
TCO2: 25 mmol/L (ref 0–100)

## 2011-04-02 LAB — PROTIME-INR
INR: 2.2 — ABNORMAL HIGH (ref 0.00–1.49)
INR: 2.4 — ABNORMAL HIGH (ref 0.00–1.49)
Prothrombin Time: 24.6 seconds — ABNORMAL HIGH (ref 11.6–15.2)
Prothrombin Time: 26.2 seconds — ABNORMAL HIGH (ref 11.6–15.2)

## 2011-04-02 LAB — CBC
HCT: 36.1 % (ref 36.0–46.0)
Hemoglobin: 12.4 g/dL (ref 12.0–15.0)
MCHC: 34.3 g/dL (ref 30.0–36.0)
MCV: 104.1 fL — ABNORMAL HIGH (ref 78.0–100.0)
Platelets: 177 10*3/uL (ref 150–400)
RBC: 3.47 MIL/uL — ABNORMAL LOW (ref 3.87–5.11)
RDW: 14 % (ref 11.5–15.5)
WBC: 5 10*3/uL (ref 4.0–10.5)

## 2011-04-02 LAB — CK TOTAL AND CKMB (NOT AT ARMC)
CK, MB: 1.4 ng/mL (ref 0.3–4.0)
Relative Index: INVALID (ref 0.0–2.5)
Total CK: 39 U/L (ref 7–177)

## 2011-04-02 LAB — CARDIAC PANEL(CRET KIN+CKTOT+MB+TROPI)
CK, MB: 1.2 ng/mL (ref 0.3–4.0)
Relative Index: INVALID (ref 0.0–2.5)
Total CK: 41 U/L (ref 7–177)
Troponin I: 0.02 ng/mL (ref 0.00–0.06)

## 2011-04-02 LAB — APTT: aPTT: 29 seconds (ref 24–37)

## 2011-04-02 LAB — POCT CARDIAC MARKERS
CKMB, poc: 1.4 ng/mL (ref 1.0–8.0)
Myoglobin, poc: 96 ng/mL (ref 12–200)
Troponin i, poc: <0.05 ng/mL (ref 0.00–0.09)

## 2011-04-02 LAB — TROPONIN I: Troponin I: 0.03 ng/mL (ref 0.00–0.06)

## 2011-04-13 ENCOUNTER — Encounter: Payer: PRIVATE HEALTH INSURANCE | Admitting: *Deleted

## 2011-04-14 ENCOUNTER — Encounter: Payer: PRIVATE HEALTH INSURANCE | Admitting: *Deleted

## 2011-04-16 ENCOUNTER — Other Ambulatory Visit (HOSPITAL_BASED_OUTPATIENT_CLINIC_OR_DEPARTMENT_OTHER): Payer: Self-pay | Admitting: Internal Medicine

## 2011-04-16 ENCOUNTER — Ambulatory Visit (INDEPENDENT_AMBULATORY_CARE_PROVIDER_SITE_OTHER): Payer: PRIVATE HEALTH INSURANCE | Admitting: *Deleted

## 2011-04-16 DIAGNOSIS — I350 Nonrheumatic aortic (valve) stenosis: Secondary | ICD-10-CM

## 2011-04-16 DIAGNOSIS — Z9889 Other specified postprocedural states: Secondary | ICD-10-CM

## 2011-04-16 DIAGNOSIS — I359 Nonrheumatic aortic valve disorder, unspecified: Secondary | ICD-10-CM

## 2011-04-16 DIAGNOSIS — Z954 Presence of other heart-valve replacement: Secondary | ICD-10-CM | POA: Insufficient documentation

## 2011-04-16 DIAGNOSIS — Z1231 Encounter for screening mammogram for malignant neoplasm of breast: Secondary | ICD-10-CM

## 2011-04-16 DIAGNOSIS — I4891 Unspecified atrial fibrillation: Secondary | ICD-10-CM

## 2011-04-16 DIAGNOSIS — Z7901 Long term (current) use of anticoagulants: Secondary | ICD-10-CM

## 2011-04-16 LAB — POCT INR: INR: 2.4

## 2011-05-04 ENCOUNTER — Ambulatory Visit
Admission: RE | Admit: 2011-05-04 | Discharge: 2011-05-04 | Disposition: A | Discharge status: Home / Self Care | Payer: PRIVATE HEALTH INSURANCE | Source: Ambulatory Visit | Attending: Internal Medicine | Admitting: Internal Medicine

## 2011-05-04 DIAGNOSIS — Z9889 Other specified postprocedural states: Secondary | ICD-10-CM

## 2011-05-04 DIAGNOSIS — Z1231 Encounter for screening mammogram for malignant neoplasm of breast: Secondary | ICD-10-CM

## 2011-05-14 ENCOUNTER — Telehealth: Payer: Self-pay | Admitting: Cardiology

## 2011-05-14 ENCOUNTER — Ambulatory Visit (INDEPENDENT_AMBULATORY_CARE_PROVIDER_SITE_OTHER): Payer: PRIVATE HEALTH INSURANCE | Admitting: *Deleted

## 2011-05-14 DIAGNOSIS — I4891 Unspecified atrial fibrillation: Secondary | ICD-10-CM

## 2011-05-14 DIAGNOSIS — Z954 Presence of other heart-valve replacement: Secondary | ICD-10-CM

## 2011-05-14 DIAGNOSIS — Z7901 Long term (current) use of anticoagulants: Secondary | ICD-10-CM

## 2011-05-14 DIAGNOSIS — I359 Nonrheumatic aortic valve disorder, unspecified: Secondary | ICD-10-CM

## 2011-05-14 DIAGNOSIS — I350 Nonrheumatic aortic (valve) stenosis: Secondary | ICD-10-CM

## 2011-05-14 LAB — POCT INR: INR: 1.7

## 2011-05-14 NOTE — Cardiovascular Report (Signed)
NAME:  [name], [name] NO.:  [id number]   MEDICAL RECORD NO.:  [id number]          PATIENT TYPE:  OIB   LOCATION:  1963                         FACILITY:  [facility]   PHYSICIAN:  Peter M. Martinique, M.D.  DATE OF BIRTH:  1941-06-29   DATE OF PROCEDURE:  03/22/2006  DATE OF DISCHARGE:                              CARDIAC CATHETERIZATION   INDICATIONS FOR PROCEDURE:  A 70 year old white female who has progressive  aortic stenosis that is severe by echo criteria.  She is now symptomatic  with recurrent chest pain.   PROCEDURES:  Right and left heart catheterization, coronary and aortic root  angiography.   EQUIPMENT:  5-French arterial sheath, 5-French 5 cm left coronary catheter,  5-French 4 cm right Judkins catheter, 5-French pigtail catheter, and 5-  French balloon tip Swan-Ganz catheter.   MEDICATIONS:  Local anesthesia. 1% Xylocaine.   CONTRAST:  100 mL of Omnipaque.   HEMODYNAMIC DATA:  Right atrial pressures 9/5 with a mean of 4 mmHg.  Right  ventricular pressure is 42 with an EDP of 8 mmHg.  Pulmonary artery pressure  is 35/11 with a mean of 23 mmHg.  Pulmonary capillary wedge pressure is  23/25 with a mean of 15 mmHg.  Aortic pressure is 148/70 with mean of 100  mmHg.  Fick cardiac output is 3.8 L/min. with an index of 2.26.  By  thermodilution, cardiac output is 4.4 with an index of 2.62.  There is no  evidence of shunt.   ANGIOGRAPHIC DATA:  Despite multiple attempts, we were unable to cross the  aortic valve with a wire or a catheter.  This despite using a pigtail  catheter, multipurpose catheter and left Amplatz catheter.  The aortic root  was deformed and the aortic valve opened and was eccentric.  It was  calcified and there was clearly significantly restricted valve motion.   Aortic root angiography demonstrates mild to moderate aortic root  enlargement.  There is no significant aortic insufficiency.   The left coronary arises and distributes  normally.  The left main coronary  is normal.   Left anterior descending artery has a less than or equal to 10% narrowing  proximally.   Left circumflex coronary appears normal.   The right coronary has a focal 10-20% narrowing proximally, otherwise is  normal.   FINAL INTERPRETATION:  1.  No significant obstructive atherosclerotic coronary artery disease.  2.  Severe aortic stenosis.  Unable to cross the valve with a wire.  3.  Mild to moderate aortic root enlargement.  4.  Upper normal right heart pressures.   PLAN:  Recommend referral for aortic valve replacement.           ______________________________  Peter M. Martinique, M.D.     PMJ/MEDQ  D:  03/22/2006  T:  03/23/2006  Job:  [id number]   cc:   Ermalene Searing. Philip Aspen, M.D.  Fax: [place], [place]  [address]  [place]  [place] [address]

## 2011-05-14 NOTE — Op Note (Signed)
   NAME:  [name], [name]                         ACCOUNT NO.:  [id number]   MEDICAL RECORD NO.:  [id number]                   PATIENT TYPE:  AMB   LOCATION:  DSC                                  FACILITY:  [facility]   PHYSICIAN:  Darrelyn Hillock, M.D.         DATE OF BIRTH:  May 10, 1941   DATE OF PROCEDURE:  02/04/2003  DATE OF DISCHARGE:                                 OPERATIVE REPORT   PREOPERATIVE DIAGNOSIS:  Left sided invasive breast cancer.   POSTOPERATIVE DIAGNOSIS:  Left sided invasive breast cancer.   PROCEDURE:  Right subclavian single lumen Port-A-Cath insertion.   SURGEON:  Darrelyn Hillock, M.D.   ANESTHESIA:  Local MAC.   DESCRIPTION OF PROCEDURE:  The patient was taken to the operating room and  placed in the supine position after adequate MAC anesthesia was induced.  The right chest and neck were prepped and draped in normal sterile fashion.  Using 1% lidocaine local anesthesia the skin, subcutaneous tissue and  periosteum of the right clavicle were anesthetized.  A right subclavian  venipuncture was performed. A guide wire was placed.  There was a deep run  of ventricular tachycardia.  The wire was pulled back. A separate area on  the right chest was then anesthetized and a transverse incision was made.  A  pocket was made in the subcutaneous fat down to the pectoralis fascia.  The  Port-A-Cath was placed within the pocket, tunneled and brought out next to  the guide wire.  The dilator and sheath were then placed over the guide  wire.  The dilator and guide wire were removed.  The Port-A-Cath was placed  down through the sheath and the sheath was removed.  There was a small kink  in the catheter at the lateral border of the clavicle which was manipulated  with the Bakes.  It then aspirated and flushed easily and the tip was in the  distal superior vena cana.  The Port-A-Cath was then flushed with 5 cc of  concentrated, 100 cc per cc of heparin.  The  incisions were closed with  subcutaneous 3-0 Vicryl and subcuticular 4-0 Monocryl.  Steri-Strips were  applied. The catheter was left accessed.  The patient was taken to PACU  where a chest x-ray is to be performed.                                               Darrelyn Hillock, M.D.    KRH/MEDQ  D:  02/04/2003  T:  02/04/2003  Job:  [id number]

## 2011-05-14 NOTE — Discharge Summary (Signed)
[place]  Patient:    CIDNEE, NISHIYAMA                      MRN: [id number] Adm. Date:  [id number] Disc. Date: [id number] Attending:  Martinique, Peter Manning CC:         Darrick Penna. Linna Darner, M.D. [facility]   Discharge Summary  HISTORY OF PRESENT ILLNESS:  Ms. Comeau is a 70 year old white female with history of hypertension, hypercholesterolemia, who presented with refractory chest pain.  She does have a history of gastroesophageal reflux disease but felt that these symptoms were different.  She had previously been treated with Toprol XL and HCTZ for high blood pressure.  For details of her past medical history, social history, family history, and physical exam, please see admission history and physical.  LABORATORY DATA:  Chest x-ray showed borderline cardiomegaly with no active disease.  ECG showed marked sinus bradycardia.  There was poor R-wave progression anteriorly with nonspecific T-wave abnormality.  White count was 5100, hemoglobin 13.1, hematocrit 38.9, platelets 227,000. Chemistry panel showed a sodium of 139, potassium 3.8, chloride 104, CO2 27, glucose 99, BUN 25, creatinine 1.4.  All other chemistries were normal.  CK was 41 with 1 MB, troponin was less than 0.01.  Lipid panel fasting showed a cholesterol of 291, LDL of 198, HDL of 63, and triglycerides of 152.  HOSPITAL COURSE:  The patient was admitted to telemetry.  She was treated with IV nitroglycerin, subcutaneous Lovenox, aspirin, and Plavix.  She subsequently ruled out for myocardial infarction by serial cardiac enzymes.  Her ECG remained stable.  Her chest pain resolved.  She underwent an abdominal ultrasound which was unremarkable.  Gallbladder was normal.  On February 22, 2001, the patient underwent cardiac catheterization.  This demonstrated mild nonobstructive coronary disease in the LAD.  There was a 20% stenosis proximally with a 30% stenosis involving the ostium of the first  diagonal branch.  The left circumflex and right coronary arteries appeared normal. Left ventricular function was normal, with an ejection fraction estimated at 65%.  There was mild aortic stenosis with a gradient of approximately 9 mmHg. There was also mild dilatation of the proximal aortic root.  Based on these findings, her nitroglycerin and Lovenox were discontinued.  The patient was observed postcatheterization and was stable for discharge once her postcatheterization observation was complete.  It was noted on admission the patient was profoundly bradycardic, with heart rates down in the 30s at rest. We stopped her Toprol and switched her to Altace for blood pressure control. She was also started on a more aggressive antireflux regimen with Nexium.  The patient was felt stable for discharge and was discharged on the afternoon of February 22, 2001.  DISCHARGE DIAGNOSES: 1. Chest pain, noncardiac. 2. Hypertension. 3. Hypercholesterolemia. 4. Obesity. 5. Gastroesophageal reflux disease.  DISCHARGE MEDICATIONS: 1. Coated aspirin 81 mg daily. 2. Altace 5 mg daily. 3. Lipitor 10 mg daily. 4. Nexium 40 mg daily. 5. HCTZ 12.5 mg daily.  ACTIVITY:  The patient is to avoid heavy lifting or straining for four days.  DIET:  She was instructed on a low fat, low cholesterol, and low salt diet.  FOLLOW-UP:  She will follow up with Dr. Martinique in one week. DD:  02/22/01 TD:  02/23/01 Job: [id number]

## 2011-05-14 NOTE — Op Note (Signed)
   NAME:  [name], [name]                         ACCOUNT NO.:  [id number]   MEDICAL RECORD NO.:  [id number]                   PATIENT TYPE:  AMB   LOCATION:  DSC                                  FACILITY:  [facility]   PHYSICIAN:  Darrelyn Hillock, M.D.         DATE OF BIRTH:  07-Dec-1941   DATE OF PROCEDURE:  01/11/2003  DATE OF DISCHARGE:                                 OPERATIVE REPORT   PREOPERATIVE DIAGNOSIS:  Left breast cancer.   POSTOPERATIVE DIAGNOSIS:  Left breast cancer.   PROCEDURES:  1. Blue dye injection.  2. Lymphatic mapping.  3. Axillary sentinel lymph node biopsy.  4. Left partial mastectomy, upper inner quadrant.   ANESTHESIA:  General.   DESCRIPTION OF PROCEDURE:  The patient was taken to the operating room and  placed in a supine position.  After adequate general anesthesia was induced,  the left breast and axilla were prepped and draped in the normal sterile  fashion.  Five cubic centimeters of Lymphazurin blue dye were injected into  the retroareolar space.  This was massaged for five minutes.  Using the  Neoprobe as a guide, an area in the left axilla was identified of high  activity.  An incision was made over this and dissecting down through  subcutaneous tissue and fat, the lateral border of the pectoralis major  muscle was identified.  It was retracted medially.  Two hot, blue lymph  nodes were easily identified, dissected free from surrounding structures,  and sent for pathologic evaluation.  Adequate hemostasis was ensured.  Touch  preps on these specimens were negative.  Sentinel lymph node ex vivo counts  were in the 8000 range.   A curvilinear incision was made over the palpable mass in the upper inner  quadrant of the left breast.  Dissecting in all directions down to the  pectoralis fascia, all tissue was removed surrounding the tumor.  Touch prep  for margins was negative for malignant cells.  Adequate hemostasis was  ensured, and the skin  was closed with subcuticular 4-0 Monocryl.  Steri-  Strips and sterile dressings were applied.  The patient tolerated the  procedure well and went to PACU in good condition.                                               Darrelyn Hillock, M.D.    KRH/MEDQ  D:  01/11/2003  T:  01/12/2003  Job:  [id number]

## 2011-05-14 NOTE — Consult Note (Signed)
NAMEMarland Kitchen  [name], [name] NO.:  [id number]   MEDICAL RECORD NO.:  [id number]          PATIENT TYPE:  INP   LOCATION:  NA                           FACILITY:  [facility]   PHYSICIAN:  Lanelle Bal, MD    DATE OF BIRTH:  Nov 18, 1941   DATE OF CONSULTATION:  04/07/2006  DATE OF DISCHARGE:                                   CONSULTATION   FOLLOWUP CARDIOLOGIST:  Peter M. Martinique, M.D.   PRIMARY CARE PHYSICIAN:  Dr. Sharlett Iles.   REASON FOR CONSULTATION:  Critical aortic stenosis and dilated ascending  aorta.   HISTORY OF PRESENT ILLNESS:  The patient is a 70 year old female, who has  had a long-standing history of a murmur of aortic valvular disease.  She  presented to Dr. Martinique with symptoms of substernal chest pain and  increasing shortness of breath with ambulation, some episodes of dizziness,  but no syncope, and shoulder and back pain.  She has had a known murmur  since childhood.  She had previously been evaluated by Dr. Martinique in 2002,  when she had mild obstructive coronary disease, but no significant valve  gradient, though her aortic root was mildly dilated.  Repeat evaluation with  echocardiogram showed worsening aortic valve gradient and stenosis with the  peak velocity up to 3.8 mm per second, a mean gradient of 34 and an aortic  valve area estimated at 0.67.  This was shown to have progressed  significantly since her study done in August of 2006.  Because of her  increasing symptoms and progression of aortic valve disease on echo, cardiac  catheterization and referral for aortic valve replacement has been done.   The patient denies any previous myocardial infarction or angioplasty.  She  does have a history of hypertension, has a history of hyperlipidemia.  She  has no diabetes, is a nonsmoker.   FAMILY HISTORY:  Father died of myocardial infarction at age 72.  Mother  died at 16, following a hip fracture.  She has had five brothers, three who  are alive.   One brother has had aortic valve, coronary artery bypass two  years ago.  Two died of accidents and one died of carcinoma.  Three sisters,  all of whom are alive.  The patient denies renal insufficiency, denies  claudication.   MEDICAL HISTORY INCLUDES:  Hypertension, hypercholesterolemia, mild obesity,  gastroesophageal reflux, history of breast cancer, status post left  lumpectomy and subsequent radiation and chemotherapy in 2004, without  history of recurrence.  She notes that she had negative nodes at that time.  Previous surgery as noted above, kidney stones removal, hysterectomy, repair  of cystocele, rectocele, and appendectomy, surgery for torsion of fallopian  tube.   ALLERGIES:  Include DEMEROL and IV CONTRAST.   CURRENT MEDICATIONS INCLUDE:  1.  Altace 5 mg daily.  2.  Lipitor 20 mg daily, which was recently changed to Zocor 40 mg daily.  3.  Hydrochlorothiazide 12.5 mg daily.  4.  Multivitamin daily.  5.  Citrucel.  6.  Protonix 40 mg daily.  7.  Detrol LA 4 mg  daily.  8.  Arimidex 1 mg daily.  9.  Vitamin E 400 units daily.  10. The patient also notes that she had recently been started on Atenolol 25      mg daily.   REVIEW OF SYSTEMS:  Positive for chest pain.  Denies resting shortness of  breath, does have exertional shortness of breath.  Has presyncope/dizziness,  but without frank syncope.  She has occasional fluttering of her heart.  Denies lower extremity edema.  GENERAL:  The patient denies any  constitutional symptoms, such as fevers, chills.  She has had a 22-pound  weight-loss over nine weeks, after going to [employer].  She does wear  glasses.  ENT:  Without complaints.  CARDIOVASCULAR:  As noted above.  She  does have occasional wheezing.  GI:  Denies gallstones, denies hematochezia.  GU:  Has had kidney stones in the past.  SKIN:  Denies any rash.  MUSCULOSKELETAL:  Denies myalgias.  NEUROLOGIC:  Denies amaurosis or TIA.  Denies psychiatric  history.  Other review of systems are negative.   PHYSICAL EXAMINATION:  GENERAL:  The patient is a healthy-appearing female,  awake and alert, and able to relate her history in detail.  VITAL SIGNS:  Blood pressure 112/60, pulse 62, respiratory rate is 18, O2  sat 97%.  HEENT:  Pupils were equal, round and reactive to light.  NECK:  Without carotid bruits.  DENTITION:  Intact.  LUNGS:  Clear.  CARDIAC EXAM:  Reveals 3/6 harsh holosystolic murmur, heard along the right  sternal border.  ABDOMINAL EXAM:  Benign without palpable masses.  EXTREMITIES:  She has no pedal edema.  She has 2+ DP and PT pulses.   LABORATORY DATA:  Cardiac catheterization films are reviewed that show no  significant obstructive disease, severe aortic stenosis.  The valve was  unable to be crossed.  She has at least moderate aortic root and ascending  aortic enlargement.  Hard to judge the exact degree of this.   IMPRESSION:  Patient with significant aortic stenosis, now with symptoms,  who presents for consideration of aortic valve replacement.  I have  discussed with her and her husband the risks and options of aortic valve  replacement.  In addition, because of some degree of dilatation of the  ascending aorta, have asked her to obtain a CT scan of the chest, without  contrast, to measure the size of the aorta.  If this is of significant size,  we have also discussed replacement of the ascending aorta, in addition to  aortic valve replacement.  We discussed tissue, versus mechanical valve, and  the risks of long-term Coumadin and anticoagulation.  The risks of the  procedure, including death, infection, stroke, myocardial infarction,  bleeding, and blood transfusion, were all discussed.  We also discussed the  possibility of heart block.  The patient, because of her relatively young  age and lack of coronary disease, prefers a mechanical valve.  She understands the need for constant monitoring of the  Coumadin and is willing  to do this.  We will tentatively arrange for CT scan of the chest and  surgery on Wednesday, April 18.      Lanelle Bal, MD  Electronically Signed    EG/MEDQ  D:  04/07/2006  T:  04/07/2006  Job:  [id number]   cc:   Peter M. Martinique, M.D.  Fax: [phone]

## 2011-05-14 NOTE — Discharge Summary (Signed)
[name], TALK NO.:  [id number]   MEDICAL RECORD NO.:  [id number]          PATIENT TYPE:  INP   LOCATION:  2038                         FACILITY:  [facility]   PHYSICIAN:  Lanelle Bal, MD    DATE OF BIRTH:  11/06/1941   DATE OF ADMISSION:  04/13/2006  DATE OF DISCHARGE:  04/20/2006                                 DISCHARGE SUMMARY   FOLLOW-UP CARDIOLOGIST:  Peter M. Martinique, M.D.   PRIMARY CARE PHYSICIAN:  Ermalene Searing. Philip Aspen, M.D.   ADMISSION DIAGNOSIS:  Critical aortic stenosis and dilated ascending aorta.   DISCHARGE DIAGNOSES:  1.  Critical aortic stenosis and dilated ascending aorta, status post aortic      valve replacement and replaced ment of ascending aorta.  2.  Postoperative atrial fibrillation.  3.  Acute blood loss anemia postoperatively.  4.  Nonobstructive coronary artery disease.  5.  Hypertension.  6.  Hypercholesterolemia.  7.  Obesity.  8.  Gastroesophageal reflux disease.  9.  History of breast cancer, status post partial left mastectomy,      subsequent radiation and chemotherapy, diagnosed in 2004.  10. History of bronchitis.   CONSULTS:  None.   PROCEDURES:  On April 13, 2006, the patient underwent aortic valve  replacement and replacement of ascending aorta.  She had a St. Jude  mechanical aortic heart valve placed by Dr. Lanelle Bal.   HISTORY AND PHYSICAL:  The patient is a 70 year old female who has a  longstanding history of murmur of aortic valvular disease.  She presented to  Dr. Martinique with symptoms of substernal chest pain, increasing shortness of  breath with ambulation, some episodes of dizziness, but no syncope, and  shoulder and back pain.  She has had a known murmur since childhood.  She  had previously been evaluated by Dr. Martinique in 2002, and she had mild  obstructive coronary artery disease, but no significant valve gradient,  though her aortic root was mildly dilated.  Repeat evaluation with  echocardiogram showed worsening aortic valve gradient and stenosis, with a  peak velocity of up to 3.8 mm/sec, mean gradient of 34, and aortic valve  area estimated at 0.67.  This was shown to have progressed significantly  since the study done in August 2006.  Because of increasing symptoms and  progression of aortic valvular disease on echocardiogram, cardiac  catheterization referral for aortic valve replacement has been done.  The  patient denies any previous myocardial infarction or angioplasty.  She has  had a history of hypertension and hyperlipidemia.  She has no diabetes and  is a nonsmoker.   HOSPITAL COURSE:  Postoperatively, the patient progressed well.  On postop  day 1, the patient was awake and alert and extubated in IMU.  Her hematocrit  was 22%, and she was transfused 1 unit of packed red blood cells.  On postop  day 2, the patient's hematocrit did increase to 26.4  Her H&H continued to  increase appropriately.  She was started on Niferex and folic acid on postop  day 3.  On postop day 2, Coumadin was started.  The patient was in atrial  fibrillation on postop day 2.  She was transferred to 2000 appropriately.   The patient was in atrial fibrillation, and she was started on IV  amiodarone, and she converted back into normal sinus rhythm.  After  returning to 2000, she was started on p.o. amiodarone and is maintained on  __________.  However, on April 19, 2006, the patient had several runs of  wide complex and remained asymptomatic.   The patient's blood pressure has been well controlled.  She has had some  volume overload, and she is being diuresed with Lasix daily, and her  potassium is being replaced.  Her weight on April 18, 2006 is 168, and her  preop weight is 147.  The patient is ambulating and continuing her incentive  spirometry.  We will continue supportive care, and she will be discharged  home in the morning if she remains stable.   PHYSICAL EXAMINATION:   VITAL SIGNS:  Blood pressure 100-30/50-70, heart rate  58-60s, temperature 99, O2 saturations 92% on room air, weight 167, preop  weight 147.  HEART:  Regular rate and rhythm.  Crisp valve click.  LUNGS:  Clear to auscultation.  ABDOMEN:  Soft, nontender.  Bowel sounds positive.  EXTREMITIES:  Mild edema.  Incision clear, dry, and intact.   LABORATORIES:  White blood cell count 6.3, hemoglobin 8.5, hematocrit 24.8,  and platelet count 93.  The patient's I&Os are 840/750.  INR 2.7.   DISCHARGE CONDITION:  Good.   DISPOSITION:  The patient will be discharged to home.   MEDICATIONS:  1.  Toprol XL 25 mg p.o. daily.  2.  Zocor 40 mg p.o. daily.  3.  Aspirin 81 mg p.o. daily.  4.  Amiodarone 200 mg p.o. b.i.d. x6-8 weeks.  5.  Niferex 150 mg daily.  6.  Folic acid 1 mg p.o. daily.  7.  Detrol LA 4 mg p.o. daily.  8.  Protonix 40 mg p.o. daily.  9.  Daily vitamins.  10. Calcium with vitamin D.  11. Fish oil.  12. Lasix 40 mg p.o. daily x7 days.  13. K-Dur 20 mEq p.o. daily x7 days.  14. Coumadin.  Dosage will be decided at time of discharge pending her      morning INR.  The patient will have a follow-up appointment in 2 days to      get her INR checked at Dr. Doug Sou office, where they will monitor and      regulate her Coumadin dose.  15. The patient will continue her Arimidex 1 mg p.o. daily.   INSTRUCTIONS:  The patient is instructed to follow a lowfat, low-salt diet.  She is to do no driving or heavy lifting of greater than 10 pounds for 3  weeks.  She is to continue her daily breathing exercises.  The patient is to  ambulate 3-4 times daily and increase activity as tolerated.  She may shower  and clean her wounds with mild soap and water.  Patient instructed to call  the office if she experiences any wound problems such as erythema, drainage,  or temperature greater than 101.5.   FOLLOWUP:  The patient has a follow-up appointment with Dr. Servando Snare on May 12, 2006 at 11:20  a.m.  She will call Dr. Martinique for an appointment in 2  weeks, where she will have a chest x-ray taken.  The patient is instructed  to call Dr. Doug Sou office for an appointment on Friday to get  her INR  level checked.      Richardson Dopp, [place]      Lanelle Bal, MD  Electronically Signed    JMW/MEDQ  D:  04/19/2006  T:  04/20/2006  Job:  [id number]   cc:   Peter M. Martinique, M.D.  Fax: [place]. Philip Aspen, M.D.  Fax: [phone]

## 2011-05-14 NOTE — H&P (Signed)
NAMEMarland Kitchen  [name], [name] NO.:  [id number]   MEDICAL RECORD NO.:  [id number]          PATIENT TYPE:  OIB   LOCATION:  1963                         FACILITY:  [facility]   PHYSICIAN:  Peter M. Martinique, M.D.  DATE OF BIRTH:  [birth year]   DATE OF ADMISSION:  03/22/2006  DATE OF DISCHARGE:                                HISTORY & PHYSICAL   HISTORY OF PRESENT ILLNESS:  [name] is a 70 year old white female who  presents with symptoms of substernal chest pain.  The patient had been  evaluated previously in 2002, at which time she underwent cardiac  catheterization which demonstrated mild obstructive coronary disease.  At  that time she had no significant aortic valve gradient, although her aortic  root was mildly dilated and she had normal left ventricular function.  She  subsequently has presented with episodes of chest discomfort described as a  pain beneath her left breast radiating around to her back also associated  with left shoulder and left neck pain.  This is a tightness or squeezing  sensation.  She had some associated shortness of breath.  She subsequently  underwent a repeat evaluation, which showed significant progression of her  aortic stenosis.  She now has a peak velocity of 3.8 m/sec. with a mean  gradient of 34 mmHg and an aortic valve area of 0.67 sq cm.  This has  progressed significantly since her last study in August 2006.  Given her  symptomatic state and the progression of her aortic valve disease, it is  recommended she undergo repeat cardiac catheterization and consideration of  aortic valve replacement.   PAST MEDICAL HISTORY:  1.  Aortic stenosis.  2.  Nonobstructive coronary disease.  3.  Hypertension.  4.  Hypercholesterolemia.  5.  Obesity.  6.  Gastroesophageal reflux disease.  7.  History of breast CA status post partial left mastectomy with subsequent      radiation and chemotherapy, diagnosed in 2004.  8.  History of bronchitis in the  remote past.   Past surgeries include partial mastectomy on the left, prior kidney stone  surgery, previous hysterectomy, history of a repair of cystocele or  rectocele, and previous appendectomy.  She is also surgery for a torsion in  her fallopian tube.   ALLERGIES:  DEMEROL and IV CONTRAST.   CURRENT MEDICATIONS:  1.  Altace 5 mg per day.  2.  Lipitor 20 mg per day.  3.  HCTZ 12.5 mg daily.  4.  Multivitamin daily.  5.  Citracal with D daily.  6.  Protonix 40 mg per day.  7.  Detrol LA 4 mg per day.  8.  Arimidex 1 mg per day.  9.  Vitamin E 400 units daily.   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  She is married.  She  has three children.  She denies tobacco or alcohol use.   FAMILY HISTORY:  Mother died at age 93 with a history of angina and  pacemaker.  Father died at age 8 and had bypass surgery at age 19.  Two  brothers have had cancer.  One brother has had aortic valve replacement.   REVIEW OF SYSTEMS:  The patient is been intolerant of Cozaar in the past.  She has no history of TIA or stroke.  She has no history of bleeding  disorder or ulcer disease.  No orthopnea, PND or edema.  Other review of  systems was negative.   PHYSICAL EXAMINATION:  GENERAL:  The patient is a pleasant white female in  no distress.  VITAL SIGNS:  Weight is 155, blood pressure 130/70, pulse 68 and regular,  respirations are normal.  HEENT:  Normocephalic, atraumatic.  Pupils equal, round and reactive to  light and accommodation.  Extraocular movements are full.  Oropharynx is  clear.  NECK:  Without JVD, adenopathy, thyromegaly or bruits.  LUNGS:  Clear.  CARDIAC:  Regular rate and rhythm with a harsh grade 2/6 systolic murmur in  the aortic area radiating to the carotids.  There are no S3.  ABDOMEN:  Soft and nontender.  EXTREMITIES:  She has no edema.  Pulses 2+ symmetric.  NEUROLOGIC:  Nonfocal.   LABORATORY DATA:  ECG shows normal sinus rhythm with poor R-wave   progression, otherwise normal.  Other labs were pending.   IMPRESSION:  1.  Progressive aortic stenosis, now severe.  The patient is having symptoms      of chest pain.  2.  History breast cancer status post left partial mastectomy, subsequent      chemotherapy and radiation therapy.  3.  Hypertension.  4.  Hypercholesterolemia.  5.  History of IVP DYE allergy.   PLAN:  The patient be pretreated for IV DYE allergy and proceed with a  cardiac catheterization and hemodynamic cardiac catheterization.           ______________________________  Peter M. Martinique, M.D.     PMJ/MEDQ  D:  03/21/2006  T:  03/22/2006  Job:  [id number]   cc:   Ermalene Searing. Philip Aspen, M.D.  Fax: [phone]

## 2011-05-14 NOTE — H&P (Signed)
[place]  Patient:    [name], [name]                      MRN: [id number] Adm. Date:  [id number] Attending:  Nolon Rod CC:         Darrick Penna. Linna Darner, M.D. [facility]   History and Physical  CHIEF COMPLAINT:  Chest pain.  HISTORY OF PRESENT ILLNESS:  [name] is a 70 year old white female with a history of hypertension, who presents with a complaint of chest pain.  The patient states she awoke yesterday morning at 3:30 a.m. with the complaint of chest, jaw, back, and left arm pain, associated with nausea and mild diaphoresis.  By 10:30 a.m. her pain had resolved.  She felt poorly the remainder of the day yesterday.  After getting up again today, her chest pain recurred, described as severe tightness in her chest, and heaviness.  She states that her left arm felt very heavy as well.  She again had recurrent nausea.  She came to the emergency room where her pain was relieved with IV nitroglycerin.  She has no prior cardiac history.  She was admitted approximately 10 years ago with chest pain, and had a negative stress test and echocardiogram at that time.  She also has a history of gastroesophageal reflux disease, but states that these symptoms are quite different.  PAST MEDICAL HISTORY: 1. Hypertension. 2. Hypercholesterolemia. 3. Obesity. 4. Gastroesophageal reflux disease with a hiatal hernia. 5. Vertigo. 6. Bronchitis.  PAST SURGICAL HISTORY: 1. Kidney stone surgery. 2. Hysterectomy. 3. Fallopian tube torsion surgery. 4. Cystocele, rectocele. 5. Appendectomy.  ALLERGIES:  DEMEROL AND IV CONTRAST DYE.  CURRENT MEDICATIONS: 1. Toprol XL 50 mg q.d. 2. HCTZ 12.5 mg q.d. 3. Zantac 75 mg q.d. 4. Multivitamin q.d.  SOCIAL HISTORY:  The patient is married.  She has three children.  She denies smoking or alcohol use.  FAMILY HISTORY:  Mother died at age 21, and had angina and a pacemaker. Father died at age 33, and had coronary artery  bypass grafting surgery at age 35.  She has a sister with diabetes mellitus.  Two brothers have had cancer.  REVIEW OF SYSTEMS:  The patient reports she was previously on Premarin, but this has been held, due to an abnormal mammogram that is scheduled to be repeated this May.  She has had no change in her bowel habits.  No dysuria. No recent cough or infection.  No history of TIA or stroke.  No claudication. All other review of systems are negative.  PHYSICAL EXAMINATION:  GENERAL:  The patient is a pleasant white female, in no apparent distress.  VITAL SIGNS:  Pulse 40, sinus bradycardia, respirations 20, temperature 98.7 degrees, blood pressure 170/80.  HEENT:  Normocephalic, atraumatic.  Pupils equal, round, reactive to light and accommodation.  Extraocular movements full.  Oropharynx clear.  NECK:  Supple, without jugular venous distention, adenopathy, thyromegaly, or bruits.  LUNGS:  Clear.  CARDIAC:  Reveals a regular rate and rhythm.  Normal S1, S2, without gallops murmurs, rubs, or clicks.  CHEST:  Chest wall reveals some tenderness in the left anterior chest.  This does not reproduce her pain.  BACK:  Normal.  ABDOMEN:  Obese, soft, with no right upper quadrant or epigastric tenderness. There is some mild tenderness in the left lower quadrant to deep palpation. There is no rebound.  There is no hepatosplenomegaly or masses.  EXTREMITIES:  Femoral and pedal pulses  2+ and symmetric.  She has no edema.  NEUROLOGIC:  Intact.  SKIN:  Warm and dry.  LABORATORY DATA:  Chest x-ray shows no active disease.  Electrocardiogram showed marked sinus bradycardia with poor R-wave progression anteriorly.  Nonspecific T-wave abnormalities.  CBC, CMET, and coags are normal.  CPK is 41, with negative MB.  Troponin is less than 0.01.  IMPRESSION: 1. Chest pain.  Her history is most consistent with unstable angina.  Need    to rule out gallstones.  Her pain is atypical for  gastroesophageal reflux    disease. 2. Hypertension. 3. Obesity. 4. Hypercholesterolemia. 5. Gastroesophageal reflux disease. 6. History of IVP CONTRAST DYE ALLERGY.  PLAN:  The patient will be admitted to telemetry.  Will obtain serial cardiac enzymes and electrocardiogram.  Will obtain an abdominal ultrasound today, to rule out gallstones.  She will be treated initially with aspirin, Plavix, IV nitroglycerin, and subcutaneous Lovenox.  Her Toprol will be held today, due to marked bradycardia in the emergency room.  The patient will probably require a cardiac catheterization in the morning.DD:  02/21/01 TD:  02/21/01 Job: [id number]

## 2011-05-14 NOTE — Telephone Encounter (Signed)
FYI. [name], while checking out in the office, wanted to schedule her appointment on 06/11/11 because that was the day that we had morning appointments available. I'm not sure if there is a need to call her.

## 2011-05-14 NOTE — Op Note (Signed)
[name], [name]                         ACCOUNT NO.:  [id number]   MEDICAL RECORD NO.:  [id number]                   PATIENT TYPE:  AMB   LOCATION:  [place]                                  FACILITY:  [facility]   PHYSICIAN:  Daryll Brod, M.D.                    DATE OF BIRTH:  January 24, 1941   DATE OF PROCEDURE:  09/09/2004  DATE OF DISCHARGE:                                 OPERATIVE REPORT   PREOPERATIVE DIAGNOSIS:  Carpal tunnel syndrome, left hand.   POSTOPERATIVE DIAGNOSIS:  Carpal tunnel syndrome, left hand.   OPERATION PERFORMED:  Decompression of left median nerve.   SURGEON:  Daryll Brod, M.D.   ASSISTANTDimas Millin.   ANESTHESIA:  Forearm based IV regional.   INDICATIONS FOR PROCEDURE:  The patient is a 70 year old female with a  history of carpal tunnel syndrome, EMG nerve conductions positive which has  not responded to conservative treatment.   DESCRIPTION OF PROCEDURE:  The patient was brought to the operating room  where forearm based IV regional anesthetic was carried out without  difficulty.  She was prepped using DuraPrep in supine position, left arm  free.  A longitudinal incision was made in the palm, carried down through  subcutaneous tissue.  Bleeders were electrocauterized.  The palmar fascia  was split.  Superficial palmar arch was identified and the flexor tendon to  the ring and little finger identified to the ulnar side of the median nerve.  The carpal retinaculum was incised with sharp dissection.  Right angle and  Sewell retractor were placed between skin and forearm fascia.  The fascia  was released for approximately 2 cm proximal to the wrist crease under  direct vision.  Canal was explored and no further lesions were identified.  Tenosynovial tissue was thickened.  The wound was irrigated.  Skin was  closed with interrupted 5-0 nylon sutures.  Sterile compressive dressing and  splint were applied.  The patient tolerated the procedure well and was taken  to the recovery room for observation in satisfactory condition.  She is  discharged to home to return to the [facility] in one week on  Vicodin.                                               Daryll Brod, M.D.    GK/MEDQ  D:  09/09/2004  T:  09/09/2004  Job:  [id number]

## 2011-05-14 NOTE — H&P (Signed)
NAME:  [name], [name] NO.:  [id number]   MEDICAL RECORD NO.:  [id number]            PATIENT TYPE:   LOCATION:                                 FACILITY:   PHYSICIAN:  Peter M. Martinique, M.D.       DATE OF BIRTH:   DATE OF ADMISSION:  07/11/2006  DATE OF DISCHARGE:                                HISTORY & PHYSICAL   HISTORY OF PRESENT ILLNESS:  [name] is a pleasant 70 year old white  female with a history of critical aortic stenosis.  She is status post  aortic valve replacement, and replacement of the ascending aorta with a St.  Jude mechanical valve on 04/13/2006.  She had development of postoperative  atrial fibrillation, that resolved with amiodarone.  She had no further  recurrence of her atrial fibrillation; and her amiodarone was discontinued  on 06/17/2006.  Yesterday, she noted while walking that she felt her heart  pounding and racing with associated weakness and lightheadedness and  sweating.  Today, she states she felt better but when she went to cardiac  rehab she was found to be in atrial fibrillation with increased ventricular  rate of 130-150.  This persisted; and she came to emergency department.  She  converted in the emergency department after a dose of IV Cardizem.  She is  feeling well now.  She has had no significant chest pain or shortness of  breath.  She had previously been intolerant of atenolol due to low blood  pressure.   PAST MEDICAL HISTORY:  1.  Aortic stenosis status post aortic valve replacement.  2.  Atrial fibrillation.  3.  Hypertension.  4.  History of herpes zoster infection.  5.  Hypercholesterolemia.  6.  Obesity.  7.  History of breast CA status post lumpectomy, chemotherapy, and radiation      therapy.  8.  GERD.  9.  History of kidney stones.   PRIOR SURGERIES INCLUDE:  1.  Hysterectomy.  2.  Cystocele and rectocele repair.  3.  Appendectomy.  4.  Removal of fallopian tub.   ALLERGIES:  DEMEROL and IV  CONTRAST.   CURRENT MEDICATIONS:  1.  Coumadin 2.5 mg daily.  2.  Aspirin 81 mg per day.  3.  Altace 5 mg per day.  4.  Multivitamin daily.  5.  Protonix 40 mg per day.  6.  Detrol LA daily.  7.  Arimidex 1 mg daily.  8.  Zocor 4 mg per day.   SOCIAL AND FAMILY HISTORY:  Reviewed in her old chart; and are unchanged.   PHYSICAL EXAMINATION:  GENERAL:  The patient is a well-developed white  female in no apparent distress.  VITAL SIGNS:  Blood pressure is 119/69.  Pulse is 66 and regular.  She is  afebrile.  Saturations are 100%.  LUNGS:  Clear to auscultation and percussion  NECK:  Without JVD, adenopathy, or bruits.  CARDIAC EXAM:  Reveals a regular rate and rhythm with a good mechanical  aortic valve click without murmur or S3.  ABDOMEN:  Soft and nontender without splenomegaly masses  or bruits.  Femoral  and pedal pulses were 2+ and symmetric.  She has no edema.  Neurologic exam  is nonfocal.   LABORATORY DATA:  ECG shows atrial fibrillation with a rate of 124 left  bundle branch block which is old.  White count 5100, hemoglobin 12.0,  hematocrit 35.3, platelets 200,000.  Sodium 137, potassium 4.1, chloride  104, CO2 28, BUN 15, creatinine 1.2, glucose of 104.  LFTs are normal.  Cardiac enzymes are normal x1.  ProTime is 17.3 with an INR of 1.4.   IMPRESSION:  1.  Recurrent paroxysmal atrial fibrillation now back in sinus rhythm.  2.  Status post aortic valve replacement.  3.  Hypertension.  4.  Hypercholesterolemia.  5.  History of breast carcinoma.  6.  Obesity.   PLAN:  The patient be discharged from the emergency room.  Will resume her  Amiodarone beginning at 400 mg b.i.d. for 1 week and then 400 informed of mg  daily.  Will also increase her Coumadin to 5 mg twice a week and 2.5 mg 5  days a week.  Will arrange follow up with me in 2 weeks; and recheck her  ProTime then.  We recommendation that she not resume cardiac rehab for 1  week, and to allow amiodarone to  take effect.           ______________________________  Peter M. Martinique, M.D.     PMJ/MEDQ  D:  07/11/2006  T:  07/11/2006  Job:  [id number]   cc:   Ermalene Searing. Philip Aspen, M.D.  Fax: [place], MD  [address]  [place]  [place] [address]

## 2011-05-14 NOTE — H&P (Signed)
NAME:  [name], [name]                         ACCOUNT NO.:  [id number]   MEDICAL RECORD NO.:  [id number]                   PATIENT TYPE:  INP   LOCATION:  [place]                                 FACILITY:  [facility]   PHYSICIAN:  Virgie Dad. Magrinat, M.D.            DATE OF BIRTH:  [birth year]   DATE OF ADMISSION:  03/29/2003  DATE OF DISCHARGE:                                HISTORY & PHYSICAL   CHIEF COMPLAINT:  Fever, neutropenia, stomatitis.   HISTORY OF PRESENT ILLNESS:  The patient is a 70 year old [place] woman  with a history of grade 3 infiltrating ductal carcinoma, 0 of one sentinel  lymph node, ER/PR positive, HER2/neu-negative, first diagnosed in January  2004, currently on Cytoxan and Adriamycin, last dose on March 19, 2003.  In  review, she was found to have a left breast mass by Dr. Darlyn Chamber  during a routine exam and proceeded to mammogram on December 31, 2002, which  showed a spiculated mass in the left breast of about 1 cm per ultrasound,  with a clinically negative axilla.  Biopsy demonstrated an invasive ductal  carcinoma, ER/PR-positive, with elevated proliferation marker negative for  HER2/neu and tetraploid.  With this information, the patient was seen by Dr.  Darrelyn Hillock, later undergoing left partial mastectomy in the upper-  inner quadrant, with axillary sentinel lymph node biopsy on January 11, 2003.  The final pathology [phone] [phone]) confirmed the presence of grade 3 2.2-  cm infiltrating ductal carcinoma with no evidence of vascular or lymphatic  invasion.  Margins were negative and the sentinel lymph node was likewise  negative.  She then was referred to Dr. Sarajane Jews C. Magrinat, for evaluation  regarding chemotherapy.  On February 05, 2003, she started a regimen of  doxorubicin and Cytoxan, tolerating it well.  The last one, #3, was on March 19, 2003, as mentioned above.  Today, she presented with significant  stomatitis, to the [facility].  A CBC was obtained, revealing  severe neutropenia, with a fever of 100.7.  She also complains of chills and  generalized malaise.  Chronic headaches.  She also complains of odynophagia.  No shortness of breath or cough.  Some dyspnea on exertion.  No chest pains  or palpitation.  No abdominal pain, nausea or vomiting at this time.  She  had an episode of diarrhea 24 hours ago, with five loose bowel movements,  but no hematochezia.  These symptoms have resolved.  No dysuria or gross  hematuria.  No DVT symptoms, numbness, tingling or peripheral edema.  The  patient is to admitted to [facility] for treatment of her  neutropenia, fever and of course, her stomatitis.   PAST MEDICAL HISTORY:  1. Left breast cancer, as above.  2. Hypercholesterolemia.  3. Hypertension.  4. GERD.  5. IBS.  6. History of periodic Botox therapy secondary to left eye spasms.  7. History of hiatal hernia.   SURGERY:  1. Status post left lumpectomy/SLN biopsy on January 11, 2003.  2. Status post total abdominal hysterectomy/bilateral salpingo-oophorectomy,     age 74.  3. Status post appendectomy.  4. Status post PAC.  5. Status post lithotripsy.  6. Status post bladder tack x2.   MEDICATIONS:  1. Botox injections every six months.  2  Altace 5 mg p.o. daily.  1. Detrol 4 mg p.o. daily.  2. Hydrochlorothiazide 25 mg p.o. half daily.  3. Protonix 40 mg p.o. daily.  4. Phenergan 25 mg p.o. daily.  5. She also has a prescription for OxyContin 5 mg q.12h. for pain.   ALLERGIES:  DEMEROL and ZOFRAN.   REVIEW OF SYSTEMS:  See HPI for significant positives.   FAMILY HISTORY:  The patient's father died at age 64 from a myocardial  infarction.  The patient's mother died at age 98 after a hip fracture.  She  is the seventh of the nine siblings.  Two of her brothers died of colon  cancer.  She also has a history of stomach cancer on her father's side.  There is no history of breast cancer or  ovarian cancer in the family.   GYNECOLOGICAL HISTORY:  She is G3, S3, P0, A0, L3, first pregnancy -- age  37.  She did nurse.  She took hormones (Premarin) about 12 to 13 years until  2001.  She is now off hormones and does not report significant problems with  hot flashes.   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  Her husband, [name], works for a [employer].  There are three children; Mardene Celeste is 62, pharmacist in  [place], [place], [place], 40, who works in Insurance underwriter here in town,  and Tammy, 38, who runs a Biomedical engineer business.  The patient has four  grandchildren.  She is a [place]:  1. Her most recent mammogram was in December.  2. Colonoscopy under Dr. Loralee Pacas. Sharlett Iles, February 2003.  3. Bone density at Dr. Jenny Reichmann McComb's office in 1999, showing osteopenia.  4. No history of tobacco abuse.  5. No history of alcohol abuse.  6. She is up to date with her flu shots.  7. She has never had Pneumovax.  8. She has had remote blood transfusion with no reaction.  9. No living will or health care power of attorney.   PHYSICAL EXAMINATION:  GENERAL:  On physical examination, this is a 8-year-  old white female in no acute distress, alert and oriented x3.  VITAL SIGNS:  Blood pressure 115/77, pulse 105, respirations 20, temperature  100.7.  Weight 176.  HEENT:  Normocephalic, alopecia, atraumatic.  PERRLA.  Sclerae anicteric.  Pale.  Oral mucosa with significant candidiasis, and in the right side of  her tongue, there is a visible ulceration.  NECK:  Neck supple.  No JVD or lymphadenopathy.  CHEST:  Chest symmetrical on inspiration, slightly decreased breath sounds  on the right.  No wheezes or rhonchi.  No rales.  No supraclavicular adenopathy.  No axillary masses.  Port site without obvious  infection.  CARDIOVASCULAR:  Regular rate and rhythm, 2/4 harsh systolic murmur, no  gallops.  ABDOMEN:  Abdomen soft, nontender.  Bowel sounds x4.  No palpable spleen or  liver.  GU AND RECTAL:  Deferred.  EXTREMITIES:  No clubbing or cyanosis.  No edema.  NEUROLOGIC:  Normal gait.  Muscle strength and DTRs 2+.   LABORATORY DATA:  White count 0.22, neutrophils 0.023, hemoglobin 9.29,  hematocrit 27, platelets 52,000.   RADIOLOGIC FINDINGS:  No new films at this time.   ASSESSMENT AND PLAN:  1. [age]-year-old [place] woman with a history of left breast     cancer, infiltrating ductal carcinoma, admitted for neutropenia:  She     will be given Neupogen 300 mcg subcutaneously daily, Primaxin 500 mg     intravenously q.6h., first dose now, blood cultures x2, stool cultures,     urinalysis.  Intravenous fluids.  Strict neutropenic precautions.  2. Stomatitis:  Diflucan 400 mg intravenously today and then 200 mg daily,     Magic Mouthwash, acyclovir elixir 300 mg p.o. five times a day.  3. Fever:  Fever will be controlled with intravenous fluids, constant     monitoring, will evaluate the blood cultures.  She also will be given     antipyretics for resolution of her fever.  We will check a new complete     blood count in a.m. to evaluate the first hemoglobin, hematocrit, white     count and neutrophil count, as well as her platelets.  4. Left breast cancer.   Dr. Sharen Heck C. Pearlie Oyster has seen and evaluated this patient prior to her  admission.  She will be admitted at room 272 at [facility]                        Virgie Dad. Magrinat, M.D.    SW/MEDQ  D:  03/29/2003  T:  03/29/2003  Job:  [id number]   cc:   [place] C. Pearlie Oyster, M.D.  [place]. [place] [address]  Fax: [phone]   Darrelyn Hillock, M.D.  [address] [place]  [place] [address]  Fax: [phone]   Loralee Pacas. Sharlett Iles, M.D. [facility]   Truddie Crumble,  M.D.  [place]. [place]  [address]  Fax: [phone]   Darlyn Chamber, M.D.  [address] [place]  [place] [address]  Fax: [phone]   Domingo Pulse, M.D.  [place]. [address] [place]  [place] [address]  Fax: [phone]

## 2011-05-14 NOTE — Discharge Summary (Signed)
NAME:  [name], [name]                         ACCOUNT NO.:  [id number]   MEDICAL RECORD NO.:  [id number]                   PATIENT TYPE:  INP   LOCATION:  [place]                                 FACILITY:  [facility]   PHYSICIAN:  Virgie Dad. Magrinat, M.D.            DATE OF BIRTH:  December 03, 1941   DATE OF ADMISSION:  03/29/2003  DATE OF DISCHARGE:  04/02/2003                                 DISCHARGE SUMMARY   DISCHARGE DIAGNOSES:  1. Fever in the setting of neutropenia.  2. Severe mucositis.  3. Uncontrolled pain.  4. Breast cancer receiving chemotherapy.  5. Hypertension.  6. Gastroesophageal reflux disease.  7. Hypercholesterolemia.  8. Irritable bowel syndrome.  9. History of hiatal hernia.  10.      Status post total abdominal hysterectomy with bilateral salpingo-     oophorectomy.  11.      Status post left lumpectomy with sentinel lymph node biopsy January     of 2004 for an ER/PR positive HER-2 NEU negative 2.2 cm infiltrating     ductal carcinoma with no evidence of lymph node invasion.  12.      History of lithotripsy.  13.      History of bladder tack-up x2.   PROCEDURE:  Intravenous chemotherapy.   HOSPITAL COURSE:  The patient was admitted with a total white cell count of  0.4, a temperature greater than 101, and uncontrolled pain secondary to  mucositis.  She was started on intravenous antibiotics consisting of  Primaxin and Neupogen was begun on the day of admission.  She also received  intravenous Diflucan.  Chest x-ray showed some lingular atelectasis and  multiple cultures were obtained, all of which remained negative at the time  of this dictation.   The patient remained febrile to the day before discharge at which point  vancomycin had been started.  She also was found to be significantly anemic  with a hemoglobin of 7.1 and she received 2 units of packed red cells.  The  patient's laboratory work at the time of discharge showed a total white cell  count in  excess of 4000 with adequate neutrophils.  The platelet count is in  the 30,000 range and the hemoglobin is stable at the 9-9.5 range.  The  patient has been afebrile for 18 hours and at this point the pain from  mucositis is moderately well controlled on local agents and she is able to  drink and eat with only moderate difficulty.  It is felt she should have no  trouble keeping herself hydrated and nourished at home.   CONDITION ON DISCHARGE:  Much improved.   DISCHARGE MEDICATIONS:  1. She will continue on all her home medications including Altace 5 mg     daily.  2. Lipitor 20 mg daily.  3. Hydrochlorothiazide 25 mg one-half tablet daily.  4. Protonix 40 mg daily.  5. Detrol LA 4 mg daily.  6. Diflucan 100 mg daily for three more days.  7. Oxycodone 5 mg one or two tablets q.4h. as needed.  8. Augmentin 875 mg b.i.d. for the next five days has been added.  9. She has Phenergan and Reglan at home as needed for nausea.  10.      She will have magic mouthwash and viscous Xylocaine to use on a     p.r.n. basis.   PAIN MANAGEMENT:  As above.   ACTIVITY:  To keep her hands washed, but otherwise unrestricted.   DIET:  Unrestricted.   WOUND CARE:  Not applicable.   SPECIAL INSTRUCTIONS:  She will call for a temperature of 101 or more.   FOLLOW UP:  She will see me in the office on April 13.  If she is  sufficiently recovered clinically she will proceed to her final cycle of  Cytoxan/adriamycin on that date.  Otherwise, we will postpone treatment for  one week.                                               Virgie Dad. Magrinat, M.D.    Lenard Galloway  D:  04/02/2003  T:  04/02/2003  Job:  [id number]   cc:   Darrelyn Hillock, M.D.  [address] [place] [address]  Fax: [phone]   Truddie Crumble, M.D.  [address] [place]  [address]  Fax: [phone]   Darlyn Chamber, M.D.  [address] [place]  [place] [address]  Fax:  [phone]   Domingo Pulse, M.D.  [place]. [address] [place]  [place] [address]  Fax: [phone]

## 2011-05-14 NOTE — Op Note (Signed)
NAMESIMON, DEVILLERS               ACCOUNT NO.:  [id number]   MEDICAL RECORD NO.:  [id number]          PATIENT TYPE:  INP   LOCATION:  2304                         FACILITY:  [facility]   PHYSICIAN:  Leda Quail, M.D.        DATE OF BIRTH:  03/29/1941   DATE OF PROCEDURE:  04/13/2006  DATE OF DISCHARGE:                                 OPERATIVE REPORT   PROCEDURE:  Intraoperative transesophageal echocardiography.   INDICATIONS FOR PROCEDURE:  Ms. Lovvorn is a 70 year old lady with known  severe aortic stenosis on cardiac catheterization and documented thoracic  ascending aortic aneurysm on CT.  She is scheduled at this time for aortic  valve replacement and repair of thoracic aortic aneurysm.  The TEE will be  used intraoperatively to assess the extent of the repair and replacement.   DESCRIPTION OF PROCEDURE:  After induction of general anesthesia, the airway  was secured with an oral endotracheal tube.  The gastric contents were  suctioned with an oral gastric tube which was then removed.  The  transesophageal probe was heavily lubricated and placed in a sleeve.  The  sleeve was heavily lubricated and both were placed blindly down the  oropharynx without resistance.  At the completion of the case, the probe was  removed.  There was no evidence of oropharyngeal damage.  The pre-bypass  examination revealed the left ventricle to be slightly thickened.  There  were no segmental defects detected.  Contractility was good.  The left  atrium was normal size and appearance.  The appendage was clean.  The intra-  atrial septum was intact.  The right atrium and right ventricle were normal  in size and appearance.  The mitral valve had thin freely mobile leaflets  coapted within the valvular plane.  There was no calcium noted.  Color  Doppler revealed trace central mitral regurgitant flow.  The pulmonary vein  showed no reversal of flow.  The aortic valve had three leaflets.  The three  leaflets were  all heavily calcified as was the annular region.  There was  markedly limited opening and movement of any leaflet.  There was no aortic  insufficiency on color Doppler.  The aorta showed an ascending aortic  dilatation that was maximal diameter of 4.4 cm.  There was just one discrete  lumen of flow noted.  The patient was placed on cardiopulmonary bypass and  underwent aortic valve replacement with a mechanical valve as well as a  graft to the ascending aorta.  At the completion of the bypass, the  patient's left ventricular function was similar to the pre-bypass  examination.  There were no segmental defects detected.  We were not on any  inotropic support.  The mitral valve showed a change of 1+ central mitral  regurgitation with slightly higher pulmonary artery pressures than the pre-  bypass.  As the period after bypass increased, the mitral regurgitation  disappeared back to the trace regurgitant flow we saw in the pre-bypass.  The aortic valve was now a mechanical valve.  There were two discrete small  regurgitant  jets at the hinge points.  No other paravalvular leaks or  perivalvular leaks were noted.  Both leaflets could be seen opening and  closing.  The dilatation of the aorta was now removed, and pledgets and  sutures could be detected in the ascending aorta, with no apparent  extravasation.  The right heart exam was essentially unchanged from the pre-  bypass examination, and the intra-atrial septum was again intact.           ______________________________  Leda Quail, M.D.     LK/MEDQ  D:  04/13/2006  T:  04/14/2006  Job:  [id number]

## 2011-05-14 NOTE — Op Note (Signed)
[name], [name]                         ACCOUNT NO.:  [id number]   MEDICAL RECORD NO.:  [id number]                   PATIENT TYPE:  AMB   LOCATION:  [facility]                                  FACILITY:  [facility]   PHYSICIAN:  Darrelyn Hillock, M.D.         DATE OF BIRTH:  24-Feb-1941   DATE OF PROCEDURE:  02/21/2004  DATE OF DISCHARGE:                                 OPERATIVE REPORT   PREOPERATIVE DIAGNOSES:  History of left breast cancer and new left breast  mass.   POSTOPERATIVE DIAGNOSES:  History of left breast cancer and new left breast  mass.   OPERATION PERFORMED:  Excisional left breast biopsy.   SURGEON:  Darrelyn Hillock, M.D.   ANESTHESIA:  Local MAC.   DESCRIPTION OF PROCEDURE:  The patient was taken to the operating room and  placed in supine position.  After adequate MAC anesthesia was induced, the  left breast was prepped and draped in the normal sterile fashion.  Using 1%  lidocaine local anesthesia, the skin overlying the 10 o'clock region of the  left breast was anesthetized.  A curvilinear incision was made dissecting  down onto a palpable mass which was excised in its entirety without  difficulty.  Also entered the seroma from the previous lumpectomy site and  that fluid was evacuated.  Adequate hemostasis was ensured and the skin was  closed with a 4-0 Monocryl.  Steri-Strips and sterile dressings were  applied.  The patient tolerated the procedure well and went to PACU in good  condition.                                               Darrelyn Hillock, M.D.    KRH/MEDQ  D:  02/21/2004  T:  02/21/2004  Job:  [id number]

## 2011-05-14 NOTE — Op Note (Signed)
NAMEMERIS, BORLAND NO.:  [id number]   MEDICAL RECORD NO.:  [id number]          PATIENT TYPE:  INP   LOCATION:  2038                         FACILITY:  [facility]   PHYSICIAN:  Lanelle Bal, MD    DATE OF BIRTH:  [birth year]   DATE OF PROCEDURE:  04/13/2006  DATE OF DISCHARGE:                                 OPERATIVE REPORT   PREOPERATIVE DIAGNOSIS:  Critical aortic stenosis and dilated ascending  aorta.   POSTOPERATIVE DIAGNOSIS:  Critical aortic stenosis and dilated ascending  aorta.   SURGICAL PROCEDURE:  Aortic valve replacement and replacement of ascending  aorta.  St. Jude mechanical aortic heart valve model [id number], serial  [id number].   SURGEON:  Lanelle Bal, MD   FIRST ASSISTANT:  Ivin Poot, M.D.   BRIEF HISTORY:  The patient is a 70 year old female who had increasing  symptoms of congestive heart failure.  She was seen by Dr. Peter Martinique.  Echocardiogram revealed evidence of critical aortic stenosis.  Cardiac  catheterization was performed which showed intact coronary arteries without  significant disease and confirmed the dilated ascending aorta.  CT scan was  performed which showed the aorta to be 4.5-4.6 cm in size in a relatively  small female.  Aortic valve replacement and possible replacement of  ascending aorta was recommended to the patient who agreed and signed  informed consent.   DESCRIPTION OF PROCEDURE:  With Swan-Ganz and arterial line monitors in  place, the patient underwent general endotracheal anesthesia without  incident.  The skin of the chest and necks was prepped with Betadine and  draped in the usual sterile manner.  A transesophageal echo probe was  placed, confirming the evidence of calcific aortic stenosis and dilated  ascending aorta.  The annulus itself was not particularly dilated.   A median sternotomy was performed.  Pericardium was opened.  The aorta was  carefully inspected.  The mid ascending  aortic appeared to be thinned and  enlarged relative to the patient's size consistent with the CT scan  findings.  It appeared to a fairly normal size at about the innominate  artery.  It was felt that cross-clamp could be used and the ascending aorta  replaced in addition to the aortic valve.  The patient was systemically  heparinized.  The ascending aorta was cannulated at the take off of the  innominate.  A dual stage venous cannula was placed in the right atrium.  The patient was placed on cardiopulmonary bypass 2.4 L/min/M2.  The  patient's body temperature was cooled to 30 degrees.  The right superior  pulmonary vein vent was placed.  The retrograde cardioplegia catheter was  placed.  The patient's body temperature was cooled to 30 degrees.  Aortic  cross-clamp was applied and 500 mL of cold blood potassium cardioplegia was  administered in the aprotic root.  The ascending aorta was opened above the  aortic valve and inspected.  It appeared to be functionally a bicuspid valve  with total fusion of the left and right coronary cusps.  The ostium of the  left and right coronary  arteries were easily visualized.  The aortic root  and sinuses of Valsalva were not particularly enlarged and then above the  sinotubular ridge, the aorta dilated.  The aorta up to the area of the  aortic cross-clamp was then excised.  A cuff of aorta with the left coronary  and right coronary cusp was preserved.  The noncoronary sinus was almost  completely removed down to the annulus.  The aortic valve annulus sized 19  Regent aortic valve.  The calcified aortic valve was excised and the annulus  carefully debrided of all loose calcium.  Care was taken to remove all the  loose calcium.  Intermittently, cold blood cardioplegia was administered  down the retrograde cardioplegia catheter.  Number 2 Ti-Cron pledgeted  sutures with the pledgets on the ventricular surface were then placed  circumferentially around the  aortic annulus and the aortic valve secured in  place and seated well.  A 26 mm Hemashield graft was tailored to the aortic  valve end to accommodate the noncoronary sinus and the coronary ostium.  Across the back wall and the noncoronary cusp, pledgeted 3-0 Prolene sutures  interrupted mattress were placed through the aortic wall and through the  graft and the graft was lowered into position and the sutures all secured.  The final two sutures were then used to run from each end with the  horizontal mattress Prolene suture and secured and then a second layer of  over-and-over Prolene suture and this sealed the graft well.  BioGlue was  placed along suture line.  The graft was then tailored to the proper length  and the distal anastomosis was performed to the distal ascending aorta with  a running 4-0 Prolene suture over felt strips.   Prior to complete closure, the head was put in the down position.  The heart  was allowed to passively fill and de-air through the distal suture line.  The aortic cross-clamp was removed with total cross-clamp time of 109  minutes.  The patient spontaneously converted to a sinus rhythm.  Body  temperature was rewarmed to 37 degrees.  The suture lines were all intact.  The patient was then ventilated and weaned from cardiopulmonary bypass  without difficulty.  She remained hemodynamically stable.  She was  decannulated in the usual fashion.  Protamine sulfate was administered.  Because of low hematocrit and low platelets, the patient did receive packed  red blood cells and platelets during the operative procedure.  She was  decannulated in the usual fashion as noted.  With operative field  hemostatic, two atrial and two ventricular pacing wires were applied.  The  pericardium was then loosely reapproximated.  Two Blake drains, one  inferiorly and one anteriorly, were left in place.  Sternum was closed with #6 stainless steel wire.  Fascia closed with  interrupted 0 Vicryl, running 3-  0 Vicryl in the subcutaneous tissue and 4-0 subcuticular stitch in the skin  edges.  Dry dressings were applied.  Sponge and needle count was reported as  correct at the completion of the procedure.  The patient tolerated the  procedure without obvious complication and was transferred to the surgical  intensive care unit for further postoperative care.      Lanelle Bal, MD  Electronically Signed     EG/MEDQ  D:  04/18/2006  T:  04/18/2006  Job:  [id number]   cc:   Peter M. Martinique, M.D.  Fax: [phone]

## 2011-05-14 NOTE — Op Note (Signed)
   NAME:  SABREA, HALLUM                         ACCOUNT NO.:  [id number]   MEDICAL RECORD NO.:  [id number]                   PATIENT TYPE:  AMB   LOCATION:  DSC                                  FACILITY:  [facility]   PHYSICIAN:  Darrelyn Hillock, M.D.         DATE OF BIRTH:  01-29-1941   DATE OF PROCEDURE:  05/29/2003  DATE OF DISCHARGE:                                 OPERATIVE REPORT   PREOPERATIVE DIAGNOSIS:  Invasive left breast cancer.   POSTOPERATIVE DIAGNOSIS:  Invasive left breast cancer.   OPERATION PERFORMED:  Removal of right subclavian Port-A-Cath.   SURGEON:  Darrelyn Hillock, M.D.   ANESTHESIA:  Local.   DESCRIPTION OF PROCEDURE:  The patient was taken to the minor room at [facility] and placed in supine position.  The right chest was  prepped and draped in the normal sterile fashion.  Using 1% lidocaine local  anesthesia, the skin over the Port-A-Cath was anesthetized as was the  pocket.  A transverse incision was made through the previous scar.  The Port-  A-Cath capsule was opened.  Prolene sutures were divided.  Port was removed.  Pressure was held.  Adequate hemostasis was ensured and the skin was closed  with subcuticular 4-0 Monocryl.  Steri-Strips and sterile dressings were  applied.  The patient tolerated the procedure well and went to PACU in good  condition.                                                Darrelyn Hillock, M.D.    KRH/MEDQ  D:  05/29/2003  T:  05/29/2003  Job:  [id number]

## 2011-05-14 NOTE — Cardiovascular Report (Signed)
[place]  Patient:    [name], [name]                      MRN: [id number] Proc. Date: 02/22/01 Adm. Date:  [id number] Disc. Date: [id number] Attending:  Martinique, Peter Manning CC:         Darrick Penna. Linna Darner, M.D. [facility]                        Cardiac Catheterization  PROCEDURES PERFORMED: 1. Selective coronary angiography. 2. Left ventricular angiography.  INDICATIONS:  70 year old female with a history of hypertension and hypercholesterolemia who presents with prolonged chest pain.  ACCESS:  Via the right femoral artery using the standard Seldinger technique.  EQUIPMENT:  #6 French 4 cm right and left Judkins catheters; #6 French pigtail catheter; #6 French arterial sheath.  MEDICATIONS:  Local anesthesia 1% Xylocaine.  CONTRAST:  120 cc of Omnipaque.  HEMODYNAMIC DATA: 1. Aortic pressures 147/79 with a mean of 104 mmHg. 2. Left ventricular pressures 156 with an EDP of 24 mmHg. 3. By pullback there is a 9 mm aortic valve gradient.  ANGIOGRAPHIC DATA: 1. Left main coronary artery:  The left coronary artery arises and    distributes normally. The left main coronary artery is normal. 2. Left anterior descending artery:  The left anterior descending artery    has mild plaque in the proximal vessel up to 20%. The first    diagonal branch has a 30% stenosis at its takeoff. 3. Left circumflex coronary artery:  The left circumflex coronary artery    is very tortuous and appears normal. 4. Right coronary artery:  The right coronary artery is also tortuous    and appears normal.  LEFT VENTRICULAR ANGIOGRAPHY: 1. The left ventricular angiography was performed in the RAO view. 2. The pigtail catheter easily crossed the aortic valve. 3. The left ventricle is normal in size and contractility with    normal systolic function. 4. The ejection fraction is estimated at 65%. 5. The proximal aortic root is mildly dilated.  FINAL INTERPRETATION: 1.  Minimal nonobstructive atherosclerotic coronary artery disease. 2. Normal left ventricular function. 3. Mild aortic stenosis. 4. Mild aortic root enlargement. DD:  02/22/01 TD:  02/23/01 Job: [id number]

## 2011-06-02 ENCOUNTER — Ambulatory Visit (INDEPENDENT_AMBULATORY_CARE_PROVIDER_SITE_OTHER): Payer: PRIVATE HEALTH INSURANCE | Admitting: *Deleted

## 2011-06-02 DIAGNOSIS — I359 Nonrheumatic aortic valve disorder, unspecified: Secondary | ICD-10-CM

## 2011-06-02 DIAGNOSIS — I350 Nonrheumatic aortic (valve) stenosis: Secondary | ICD-10-CM

## 2011-06-02 DIAGNOSIS — I4891 Unspecified atrial fibrillation: Secondary | ICD-10-CM

## 2011-06-02 DIAGNOSIS — Z7901 Long term (current) use of anticoagulants: Secondary | ICD-10-CM

## 2011-06-02 DIAGNOSIS — Z954 Presence of other heart-valve replacement: Secondary | ICD-10-CM

## 2011-06-02 LAB — POCT INR: INR: 2.4

## 2011-06-23 ENCOUNTER — Ambulatory Visit (INDEPENDENT_AMBULATORY_CARE_PROVIDER_SITE_OTHER): Payer: Medicare Other | Admitting: *Deleted

## 2011-06-23 DIAGNOSIS — Z954 Presence of other heart-valve replacement: Secondary | ICD-10-CM

## 2011-06-23 DIAGNOSIS — Z7901 Long term (current) use of anticoagulants: Secondary | ICD-10-CM

## 2011-06-23 DIAGNOSIS — I350 Nonrheumatic aortic (valve) stenosis: Secondary | ICD-10-CM

## 2011-06-23 DIAGNOSIS — I359 Nonrheumatic aortic valve disorder, unspecified: Secondary | ICD-10-CM

## 2011-06-23 DIAGNOSIS — I4891 Unspecified atrial fibrillation: Secondary | ICD-10-CM

## 2011-06-23 LAB — POCT INR: INR: 2.8

## 2011-07-21 ENCOUNTER — Ambulatory Visit (INDEPENDENT_AMBULATORY_CARE_PROVIDER_SITE_OTHER): Payer: Medicare Other | Admitting: *Deleted

## 2011-07-21 DIAGNOSIS — Z954 Presence of other heart-valve replacement: Secondary | ICD-10-CM

## 2011-07-21 DIAGNOSIS — I359 Nonrheumatic aortic valve disorder, unspecified: Secondary | ICD-10-CM

## 2011-07-21 DIAGNOSIS — I350 Nonrheumatic aortic (valve) stenosis: Secondary | ICD-10-CM

## 2011-07-21 DIAGNOSIS — I4891 Unspecified atrial fibrillation: Secondary | ICD-10-CM

## 2011-07-21 DIAGNOSIS — Z7901 Long term (current) use of anticoagulants: Secondary | ICD-10-CM

## 2011-07-21 LAB — POCT INR: INR: 3.1

## 2011-08-18 ENCOUNTER — Ambulatory Visit (INDEPENDENT_AMBULATORY_CARE_PROVIDER_SITE_OTHER): Payer: BC Managed Care – PPO | Admitting: *Deleted

## 2011-08-18 DIAGNOSIS — I359 Nonrheumatic aortic valve disorder, unspecified: Secondary | ICD-10-CM

## 2011-08-18 DIAGNOSIS — Z7901 Long term (current) use of anticoagulants: Secondary | ICD-10-CM

## 2011-08-18 DIAGNOSIS — Z954 Presence of other heart-valve replacement: Secondary | ICD-10-CM

## 2011-08-18 DIAGNOSIS — I4891 Unspecified atrial fibrillation: Secondary | ICD-10-CM

## 2011-08-18 DIAGNOSIS — I350 Nonrheumatic aortic (valve) stenosis: Secondary | ICD-10-CM

## 2011-08-18 LAB — POCT INR: INR: 3.6

## 2011-09-15 ENCOUNTER — Ambulatory Visit (INDEPENDENT_AMBULATORY_CARE_PROVIDER_SITE_OTHER): Payer: BC Managed Care – PPO | Admitting: *Deleted

## 2011-09-15 DIAGNOSIS — Z7901 Long term (current) use of anticoagulants: Secondary | ICD-10-CM

## 2011-09-15 DIAGNOSIS — I4891 Unspecified atrial fibrillation: Secondary | ICD-10-CM

## 2011-09-15 DIAGNOSIS — I350 Nonrheumatic aortic (valve) stenosis: Secondary | ICD-10-CM

## 2011-09-15 DIAGNOSIS — Z954 Presence of other heart-valve replacement: Secondary | ICD-10-CM

## 2011-09-15 DIAGNOSIS — I359 Nonrheumatic aortic valve disorder, unspecified: Secondary | ICD-10-CM

## 2011-09-15 LAB — POCT INR: INR: 3.7

## 2011-09-29 ENCOUNTER — Observation Stay (HOSPITAL_COMMUNITY)
Admission: EM | Admit: 2011-09-29 | Discharge: 2011-09-30 | Disposition: A | Discharge status: Home / Self Care | Payer: BC Managed Care – PPO | Attending: Cardiology | Admitting: Cardiology

## 2011-09-29 ENCOUNTER — Emergency Department (HOSPITAL_COMMUNITY): Payer: BC Managed Care – PPO

## 2011-09-29 DIAGNOSIS — K449 Diaphragmatic hernia without obstruction or gangrene: Secondary | ICD-10-CM | POA: Insufficient documentation

## 2011-09-29 DIAGNOSIS — R079 Chest pain, unspecified: Principal | ICD-10-CM | POA: Insufficient documentation

## 2011-09-29 DIAGNOSIS — I4891 Unspecified atrial fibrillation: Secondary | ICD-10-CM | POA: Insufficient documentation

## 2011-09-29 DIAGNOSIS — Z23 Encounter for immunization: Secondary | ICD-10-CM | POA: Insufficient documentation

## 2011-09-29 DIAGNOSIS — Z853 Personal history of malignant neoplasm of breast: Secondary | ICD-10-CM | POA: Insufficient documentation

## 2011-09-29 DIAGNOSIS — K219 Gastro-esophageal reflux disease without esophagitis: Secondary | ICD-10-CM | POA: Insufficient documentation

## 2011-09-29 DIAGNOSIS — Z7901 Long term (current) use of anticoagulants: Secondary | ICD-10-CM | POA: Insufficient documentation

## 2011-09-29 DIAGNOSIS — Z954 Presence of other heart-valve replacement: Secondary | ICD-10-CM | POA: Insufficient documentation

## 2011-09-29 LAB — PROTIME-INR
INR: 2.85 — ABNORMAL HIGH (ref 0.00–1.49)
Prothrombin Time: 30.4 seconds — ABNORMAL HIGH (ref 11.6–15.2)

## 2011-09-29 LAB — BASIC METABOLIC PANEL
BUN: 17 mg/dL (ref 6–23)
CO2: 31 mEq/L (ref 19–32)
Calcium: 9.6 mg/dL (ref 8.4–10.5)
Chloride: 99 mEq/L (ref 96–112)
Creatinine, Ser: 1.22 mg/dL — ABNORMAL HIGH (ref 0.50–1.10)
GFR calc Af Amer: 50 mL/min — ABNORMAL LOW (ref >90)
GFR calc non Af Amer: 43 mL/min — ABNORMAL LOW (ref >90)
Glucose, Bld: 102 mg/dL — ABNORMAL HIGH (ref 70–99)
Potassium: 4 mEq/L (ref 3.5–5.1)
Sodium: 135 mEq/L (ref 135–145)

## 2011-09-29 LAB — CBC
HCT: 33.3 % — ABNORMAL LOW (ref 36.0–46.0)
Hemoglobin: 11.9 g/dL — ABNORMAL LOW (ref 12.0–15.0)
MCH: 35.1 pg — ABNORMAL HIGH (ref 26.0–34.0)
MCHC: 35.7 g/dL (ref 30.0–36.0)
MCV: 98.2 fL (ref 78.0–100.0)
Platelets: 158 10*3/uL (ref 150–400)
RBC: 3.39 MIL/uL — ABNORMAL LOW (ref 3.87–5.11)
RDW: 12.8 % (ref 11.5–15.5)
WBC: 4.5 10*3/uL (ref 4.0–10.5)

## 2011-09-29 LAB — POCT I-STAT TROPONIN I
Troponin i, poc: 0 ng/mL (ref 0.00–0.08)
Troponin i, poc: 0 ng/mL (ref 0.00–0.08)

## 2011-09-29 LAB — DIFFERENTIAL
Basophils Absolute: 0.1 10*3/uL (ref 0.0–0.1)
Basophils Relative: 1 % (ref 0–1)
Eosinophils Absolute: 0.1 10*3/uL (ref 0.0–0.7)
Eosinophils Relative: 3 % (ref 0–5)
Lymphocytes Relative: 36 % (ref 12–46)
Lymphs Abs: 1.6 10*3/uL (ref 0.7–4.0)
Monocytes Absolute: 0.5 10*3/uL (ref 0.1–1.0)
Monocytes Relative: 10 % (ref 3–12)
Neutro Abs: 2.2 10*3/uL (ref 1.7–7.7)
Neutrophils Relative %: 49 % (ref 43–77)

## 2011-09-30 ENCOUNTER — Observation Stay (HOSPITAL_COMMUNITY): Payer: BC Managed Care – PPO

## 2011-09-30 DIAGNOSIS — I359 Nonrheumatic aortic valve disorder, unspecified: Secondary | ICD-10-CM

## 2011-09-30 DIAGNOSIS — R079 Chest pain, unspecified: Secondary | ICD-10-CM

## 2011-09-30 LAB — CBC
HCT: 30.9 % — ABNORMAL LOW (ref 36.0–46.0)
Hemoglobin: 11.1 g/dL — ABNORMAL LOW (ref 12.0–15.0)
MCH: 35.1 pg — ABNORMAL HIGH (ref 26.0–34.0)
MCHC: 35.9 g/dL (ref 30.0–36.0)
MCV: 97.8 fL (ref 78.0–100.0)
Platelets: 140 10*3/uL — ABNORMAL LOW (ref 150–400)
RBC: 3.16 MIL/uL — ABNORMAL LOW (ref 3.87–5.11)
RDW: 12.5 % (ref 11.5–15.5)
WBC: 4.6 10*3/uL (ref 4.0–10.5)

## 2011-09-30 LAB — BASIC METABOLIC PANEL
BUN: 16 mg/dL (ref 6–23)
CO2: 28 mEq/L (ref 19–32)
Calcium: 9 mg/dL (ref 8.4–10.5)
Chloride: 100 mEq/L (ref 96–112)
Creatinine, Ser: 1.04 mg/dL (ref 0.50–1.10)
GFR calc Af Amer: 61 mL/min — ABNORMAL LOW (ref >90)
GFR calc non Af Amer: 53 mL/min — ABNORMAL LOW (ref >90)
Glucose, Bld: 87 mg/dL (ref 70–99)
Potassium: 3.7 mEq/L (ref 3.5–5.1)
Sodium: 134 mEq/L — ABNORMAL LOW (ref 135–145)

## 2011-09-30 LAB — PROTIME-INR
INR: 2.82 — ABNORMAL HIGH (ref 0.00–1.49)
Prothrombin Time: 30.1 seconds — ABNORMAL HIGH (ref 11.6–15.2)

## 2011-09-30 LAB — CARDIAC PANEL(CRET KIN+CKTOT+MB+TROPI)
CK, MB: 2.7 ng/mL (ref 0.3–4.0)
CK, MB: 2.6 ng/mL (ref 0.3–4.0)
Relative Index: INVALID (ref 0.0–2.5)
Relative Index: INVALID (ref 0.0–2.5)
Total CK: 57 U/L (ref 7–177)
Total CK: 53 U/L (ref 7–177)
Troponin I: <0.3 ng/mL (ref <0.30)
Troponin I: <0.3 ng/mL (ref <0.30)

## 2011-09-30 LAB — LIPID PANEL
Cholesterol: 145 mg/dL (ref 0–200)
HDL: 77 mg/dL (ref >39)
LDL Cholesterol: 60 mg/dL (ref 0–99)
Total CHOL/HDL Ratio: 1.9 RATIO
Triglycerides: 42 mg/dL (ref <150)
VLDL: 8 mg/dL (ref 0–40)

## 2011-09-30 LAB — CK TOTAL AND CKMB (NOT AT ARMC)
CK, MB: 2.6 ng/mL (ref 0.3–4.0)
Relative Index: INVALID (ref 0.0–2.5)
Total CK: 67 U/L (ref 7–177)

## 2011-09-30 LAB — APTT: aPTT: 37 seconds (ref 24–37)

## 2011-09-30 LAB — TROPONIN I: Troponin I: <0.3 ng/mL (ref <0.30)

## 2011-09-30 LAB — D-DIMER, QUANTITATIVE: D-Dimer, Quant: 0.67 ug/mL-FEU — ABNORMAL HIGH (ref 0.00–0.48)

## 2011-09-30 MED ORDER — TECHNETIUM TC 99M TETROFOSMIN IV KIT
30.0000 | PACK | Freq: Once | INTRAVENOUS | Status: AC | PRN
  Administered 2011-09-30: 30 via INTRAVENOUS

## 2011-09-30 MED ORDER — TECHNETIUM TC 99M TETROFOSMIN IV KIT
10.0000 | PACK | Freq: Once | INTRAVENOUS | Status: AC | PRN
Start: 2011-09-30 — End: 2011-09-30
  Administered 2011-09-30: 10 via INTRAVENOUS

## 2011-10-04 NOTE — Discharge Summary (Addendum)
NAMEMarland Kitchen  [name], [name] NO.:  [id number]  MEDICAL RECORD NO.:  [id number]  LOCATION:  [id number]                         FACILITY:  [facility]  PHYSICIAN:  Wilkins Elpers M. Martinique, M.D.  DATE OF BIRTH:  05-Dec-1941  DATE OF ADMISSION:  09/29/2011 DATE OF DISCHARGE:  09/30/2011                              DISCHARGE SUMMARY   DISCHARGE DIAGNOSES: 1. Chest pain.     a.     Negative cardiac enzymes x3.     b.     Nuclear stress test September 30, 2011 showing no evidence of      ischemia.  Apical thinning versus small prior apical infarcts      noted.  Ejection fraction 50%.     c.     Slightly abnormal D-dimer with clinical suspicion for      pulmonary embolism, currently therapeutic on Coumadin.     d.     LV dysfunction with EF 45-50% by echo this admission. 2. Severe aortic stenosis, status post aortic valve replacement with     St. Jude mechanical prosthesis by Dr. Servando Snare in 2007.  No     significant coronary artery disease at that time. 3. Atrial fibrillation, treated with amiodarone and Coumadin. 4. Breast cancer in 2004, status post lumpectomy and chemoradiation. 5. Gastroesophageal reflux disease with hiatal hernia.  HOSPITAL COURSE:  [name] is a 70 year old lady with history of severe aortic stenosis with no significant coronary artery disease by catheterization in 2007, who was admitted to the [facility] with complaints of chest pain.  She has had intermittent pain on the day of admission, coming and going lasting 1-3 minutes at a time, all day long.  She does not have any nausea, vomiting, or shortness of breath. The pain was nonexertional.  Her symptoms were felt somewhat atypical and possibly related to her gastroesophageal reflux disease.  She was admitted to the hospital for rule out and cardiac enzymes were cycled which were negative x3.  D-dimer was obtained by Dr. Martinique this morning, which was mildly elevated at 0.67, but per discussion with  Dr. Martinique, he does not feel that she is exhibiting clinical signs and symptoms of pulmonary embolism and feels no further workup is necessary for this.  Room air sats were normal and she is not tachycardic.  She underwent nuclear stress testing demonstrating the above findings. This was felt to be a low risk scan by Dr. Verl Blalock and Dr. Martinique by discussion. I also personally discussed the patient's echocardiogram results with Dr. Martinique, given the diffuse hypokinesis and EF 45-50%. He does not currently feel that  her chest pain represents an acute coronary syndrome and will follow her closely in the office. Dr. Martinique has seen and examined the patient today and feels she is stable to discharge.  DISCHARGE LABS:  WBC 4.6, hemoglobin 11.1, hematocrit 30.9, platelet count 140.  INR 2.82, D-dimer 0.67.  Sodium 134, potassium 2.7, chloride 100, CO2 of 20, glucose 87, BUN 16, creatinine 1.04.  Cardiac enzymes negative x3.  Total cholesterol 145, triglycerides 42, HDL 77, LDL 60.  STUDIES: 1. Nuclear stress test on September 30, 2011 showed no new EKG changes.  Apical setting versus prior small apical infarct.  No ischemia     noted.  EF 50%.  Hypokinesis in the distal septum and apex. 2. Chest x-ray on September 29, 2011 demonstrated no evidence of active     pulmonary disease.  No significant interval change. 3. 2D Echo on September 30, 2011 showed EF [id number] grade 2 diastolic dysfunction, mild AR, mild MR.  DISCHARGE MEDICATIONS: 1. Simvastatin 40 mg half a tablet daily.  This is decreased to 20 mg     daily given the fact that she is on amiodarone. 2. Aspirin 81 mg daily. 3. Calcium carbonate/vitamin D 1 tablet t.i.d. 4. Hydrochlorothiazide 25 mg daily. 5. Losartan 100 mg day. 6. Metoclopramide 5 mg daily. 7. Multivitamin 1 tablet daily. 8. Amiodarone 100 mg daily. 9. Protonix 40 mg daily. 10.Tolterodine LA 4 mg daily. 11.Vitamin B12 one tablet daily. 12.Vitamin C 500 mg 2 tablets  t.i.d. 13.Vitamin D3 2000 units daily. 14.Coumadin 5 mg half a tablet on Monday and Friday, one tablet all     other days.  DISPOSITION:  [name] will be discharged in stable condition to home. She is instructed to increase activity slowly and keep her IV site clean and dry.  She will follow up with Dr. Martinique as an outpatient and our office will call her with this appointment.  We have also put in a request to make sure that she does have Coumadin Clinic followup appointment scheduled.  Her INR was therapeutic at 2.82 this admission.  DURATION OF DISCHARGE ENCOUNTER:  Greater than 30 minutes including physician and PA time.     [name], P.A.C.   ______________________________ Natalina Wieting M. Martinique, M.D.    DD/MEDQ  D:  09/30/2011  T:  10/01/2011  Job:  [id number]  Electronically Signed by [name]  on 10/04/2011 09:39:33 AM Electronically Signed by Lateria Alderman Martinique M.D. on 10/05/2011 01:03:16 PM

## 2011-10-05 NOTE — Discharge Summary (Addendum)
  NAMEMarland Kitchen  SONITA, FOOS NO.:  [id number]  MEDICAL RECORD NO.:  [id number]  LOCATION:  [id number]                         FACILITY:  [facility]  PHYSICIAN:  Peter M. Martinique, M.D.  DATE OF BIRTH:  12/23/1941  DATE OF ADMISSION:  09/29/2011 DATE OF DISCHARGE:  09/30/2011                              DISCHARGE SUMMARY   ADDENDUM  After discussion with Dr. Martinique about the patient's chest pain, tramadol 50 mg p.o. q.8 hours was added to her medicine list p.r.n., moderate-to-severe pain.     Melina Copa, P.A.C.   ______________________________ Peter M. Martinique, M.D.    DD/MEDQ  D:  10/04/2011  T:  10/04/2011  Job:  [id number]  Electronically Signed by PETER Martinique M.D. on 10/05/2011 01:03:20 PM Electronically Signed by Melina Copa  on 10/10/2011 03:09:35 PM

## 2011-10-06 ENCOUNTER — Ambulatory Visit (INDEPENDENT_AMBULATORY_CARE_PROVIDER_SITE_OTHER): Payer: BC Managed Care – PPO | Admitting: *Deleted

## 2011-10-06 DIAGNOSIS — Z954 Presence of other heart-valve replacement: Secondary | ICD-10-CM

## 2011-10-06 DIAGNOSIS — I359 Nonrheumatic aortic valve disorder, unspecified: Secondary | ICD-10-CM

## 2011-10-06 DIAGNOSIS — I350 Nonrheumatic aortic (valve) stenosis: Secondary | ICD-10-CM

## 2011-10-06 DIAGNOSIS — Z7901 Long term (current) use of anticoagulants: Secondary | ICD-10-CM

## 2011-10-06 DIAGNOSIS — I4891 Unspecified atrial fibrillation: Secondary | ICD-10-CM

## 2011-10-06 LAB — POCT INR: INR: 2.6

## 2011-10-13 ENCOUNTER — Ambulatory Visit (INDEPENDENT_AMBULATORY_CARE_PROVIDER_SITE_OTHER): Payer: BC Managed Care – PPO | Admitting: Cardiology

## 2011-10-13 ENCOUNTER — Encounter: Payer: Self-pay | Admitting: Cardiology

## 2011-10-13 DIAGNOSIS — I359 Nonrheumatic aortic valve disorder, unspecified: Secondary | ICD-10-CM

## 2011-10-13 DIAGNOSIS — R42 Dizziness and giddiness: Secondary | ICD-10-CM

## 2011-10-13 DIAGNOSIS — Z952 Presence of prosthetic heart valve: Secondary | ICD-10-CM

## 2011-10-13 DIAGNOSIS — Z7901 Long term (current) use of anticoagulants: Secondary | ICD-10-CM

## 2011-10-13 DIAGNOSIS — I4891 Unspecified atrial fibrillation: Secondary | ICD-10-CM

## 2011-10-13 DIAGNOSIS — I10 Essential (primary) hypertension: Secondary | ICD-10-CM

## 2011-10-13 DIAGNOSIS — R079 Chest pain, unspecified: Secondary | ICD-10-CM

## 2011-10-13 NOTE — Assessment & Plan Note (Signed)
She has a history of atrial fibrillation following her valve surgery in 2007. She was on amiodarone for 2 months and then it was discontinued. She had a recurrent episode and was placed back on amiodarone. She has been on 100 mg daily for the past 5 years without recurrence.

## 2011-10-13 NOTE — H&P (Signed)
NAME:  [name], [name] NO.:  [id number]  MEDICAL RECORD NO.:  [id number]  LOCATION:  [facility]                         FACILITY:  [facility]  PHYSICIAN:  Lovenia Shuck, MD   DATE OF BIRTH:  1941-11-09  DATE OF ADMISSION:  09/29/2011 DATE OF DISCHARGE:                             HISTORY & PHYSICAL   CARDIOLOGIST:  Peter M. Martinique, MD  CHIEF COMPLAINT:  Chest pain.  HISTORY OF PRESENT ILLNESS:  [name] is a pleasant 70 year old white female followed by Dr. Martinique, who presents to the emergency room tonight with chest pain that has been present since this morning.  She has got some pain in her left chest wall that comes and goes, lasting 1 to 2 to 3 minutes at time, this has been doing this all day.  She also has a constant pain in her lower back.  She has not had any nausea, vomiting, or shortness of breath.  She did not have any orthopnea or PND.  The pain does not appear to be exertional.  This is somewhat different than pain that she presented with in September 2010.  She is status post an aortic valve replacement by Dr. Servando Snare in 2007 with St. Jude mechanical prosthesis, for which, she is on chronic anticoagulation.  Her heart cath prior to this did not show any significant coronary artery disease.  PAST MEDICAL HISTORY: 1. Severe aortic stenosis, status post aortic valve replacement with     St. Jude mechanical prosthesis by Dr. Servando Snare in 2007.  No     significant coronary artery disease at that time. 2. Atrial fibrillation:  Developed several months postoperatively and     she has been treated with amiodarone. 3. Breast cancer in 2004, status post lumpectomy, chemo and radiation. 4. Gastroesophageal reflux disease with hiatal hernia.  ALLERGIES:  DEMEROL and CONTRAST MEDIA.  HOME MEDICATIONS: 1. Coumadin 2.5 mg on Monday and Friday, 5 mg every other day. 2. Vitamin D3 2000 units daily. 3. Aspirin 81 mg daily. 4. Vitamin B12 one daily. 5. Calcium  carbonate t.i.d. 6. Vitamin C 1000 mg three times a day. 7. Multivitamin daily. 8. Protonix 40 mg daily. 9. Metoclopramide 5 mg daily. 10.Hydrochlorothiazide 25 mg daily. 11.Tolterodine 4 mg daily. 12.Amiodarone 100 mg daily. 13.Simvastatin 40 mg daily. 14.Losartan 100 mg daily.  SOCIAL HISTORY:  She lives at home with her husband.  She does not use alcohol or tobacco.  FAMILY HISTORY:  There is no history of early coronary artery disease. Her father did have an MI in his 21s and her mother had a pacemaker.  REVIEW OF SYSTEMS:  A full review of systems is negative except as stated in the HPI.  PHYSICAL EXAMINATION:  VITAL SIGNS:  Blood pressure 138/56, pulse 64, temperature 98.5, respirations 20. GENERAL:  No acute distress. HEENT:  Extraocular movements intact.  Oropharynx benign.  Nonicteric sclerae. NECK:  Supple. CARDIOVASCULAR:  Regular rate and rhythm with mechanical aortic click, no significant murmurs, no jugular venous distention. LUNGS:  Clear to auscultation bilaterally. ABDOMEN:  Soft with mild midepigastric tenderness to palpation. EXTREMITIES:  No clubbing, cyanosis, or edema.  Pulses are intact throughout. NEURO:  Grossly afocal.  Moves all extremities well. SKIN:  No rashes. LYMPHS:  No lymphadenopathy.  EKG shows normal sinus rhythm with a left bundle-branch block.  Chest x- ray is clear.  Her INR is 2.85 and her point-of-care troponin is negative.  Her BUN and CBC were reviewed and are within normal limits.  ASSESSMENT AND PLAN: 1. Chest pain:  Given her mild abdominal tenderness, this may be     related to her gastroesophageal reflux disease.  We will rule her     out for acute myocardial infarction with serial enzymes and     increase her aspirin to 325 for now.  She is currently on Coumadin     and her INR as 2.8.  We will continue her on Protonix and may     consider a functional study as an outpatient. 2. Aortic valve replacement:  She reports a  recent echo in the last     few months, but do not currently have access to this.  Her exam is     reassuring.  We will continue her Coumadin and make no further     changes to her medications. 3. Atrial fibrillation:  She is currently in sinus, on amiodarone.     Lovenia Shuck, MD     MMB/MEDQ  D:  09/29/2011  T:  09/30/2011  Job:  [id number]  cc:   Peter M. Martinique, M.D.  Electronically Signed by Aletta Edouard MD on 10/13/2011 11:47:36 PM

## 2011-10-13 NOTE — Assessment & Plan Note (Signed)
Blood pressure control is excellent today. We will continue on her current therapy.

## 2011-10-13 NOTE — Assessment & Plan Note (Signed)
She is status post mechanical aortic valve replacement and grafting of the ascending aorta with a St. Jude mechanical conduit in March of 2007. Her recent echocardiogram showed good valve function. She will need to continue with anticoagulation. She needs long-term SBE prophylaxis.

## 2011-10-13 NOTE — Patient Instructions (Signed)
Stop taking amiodarone.  Continue your other medications.  I will see you again in 3 months.

## 2011-10-13 NOTE — Assessment & Plan Note (Signed)
She has symptoms of disequilibrium, weakness, and clumsiness. This possibly could be related to her amiodarone. Since she's had no recurrence of her atrial fibrillation for 5 years I recommended stopping it at this point and we will see if the symptoms improve.

## 2011-10-13 NOTE — Progress Notes (Signed)
HPI [name] is seen for followup today after recent hospitalization with chest pain. She ruled out for myocardial infarction. She had a nuclear stress test that was negative. Her echocardiogram looked fine. Although her d-dimer was elevated there was really no other clinical evidence of pulmonary emboli and she was on therapeutic anticoagulation. She was treated with tramadol. Her symptoms have resolved.  Allergies  Allergen Reactions  . Demerol   . Ivp Dye (Iodinated Diagnostic Agents)   . Meperidine Hcl     Current Outpatient Prescriptions on File Prior to Visit  Medication Sig Dispense Refill  . amiodarone (PACERONE) 100 MG tablet Take 100 mg by mouth daily.        . Ascorbic Acid (VITAMIN C) 1000 MG tablet Take 1,000 mg by mouth 3 (three) times daily.        Marland Kitchen aspirin 81 MG tablet Take 81 mg by mouth daily.        . Calcium Citrate-Vitamin D (CITRACAL + D PO) Take by mouth daily.        . Cholecalciferol (VITAMIN D3) 1000 UNITS tablet Take 1,000 Units by mouth daily.        . Cyanocobalamin (VITAMIN B-12) 2500 MCG SUBL Place under the tongue daily.        . hydrochlorothiazide 25 MG tablet Take 1 tablet (25 mg total) by mouth daily.  90 tablet  3  . losartan (COZAAR) 100 MG tablet Take 100 mg by mouth daily.        . metoCLOPramide (REGLAN) 5 MG tablet Take 5 mg by mouth daily.        . Multiple Vitamin (MULTIVITAMIN) tablet Take 1 tablet by mouth daily.        . pantoprazole (PROTONIX) 40 MG tablet Take 40 mg by mouth daily.        . simvastatin (ZOCOR) 40 MG tablet Take 20 mg by mouth at bedtime.       . tolterodine (DETROL LA) 4 MG 24 hr capsule Take 4 mg by mouth daily.        . Warfarin Sodium (COUMADIN PO) Take by mouth as directed.          Past Medical History  Diagnosis Date  . Aortic stenosis, severe   . HTN (hypertension)   . Hypercholesteremia   . PVC's (premature ventricular contractions)   . A-fib   . Venous varices    . GERD (gastroesophageal reflux disease)    . Breast ca     Past Surgical History  Procedure Date  . Aortic valve replacement 04/13/06    St. Jude mechanical conduit  . Total abdominal hysterectomy   . Cystocele repair   . Rectocele repair   . Appendectomy   . Kidney stone surgery     Family History  Problem Relation Age of Onset  . Heart disease Mother   . Heart disease Father 50    CABG  . Other Brother     AVR    History   Social History  . Marital Status: Married    Spouse Name: N/A    Number of Children: 3  . Years of Education: N/A   Occupational History  . bakery/caterer Other    retired   Social History Main Topics  . Smoking status: Never Smoker   . Smokeless tobacco: Not on file  . Alcohol Use: No  . Drug Use: No  . Sexually Active: Not on file   Other Topics Concern  . Not  on file   Social History Narrative  . No narrative on file    ROS  review of systems is positive for sensation of clumsiness and poor balance. She has some leg weakness. This has been present for the past year. In the past she has been quite active but she does note a noticeable difference this year.  All other systems were reviewed and are negative.  PHYSICAL EXAM BP 134/78  Pulse 54  Ht 5' (1.524 m)  Wt 144 lb 12.8 oz (65.681 kg)  BMI 28.28 kg/m2 The patient is alert and oriented x 3.  The mood and affect are normal.  The skin is warm and dry.  Color is normal.  The HEENT exam reveals that the sclera are nonicteric.  The mucous membranes are moist.  The carotids are 2+ without bruits.  There is no thyromegaly.  There is no JVD.  The lungs are clear.  The chest wall is non tender.  The heart exam reveals a regular rate with a normal S1 and split S2.  She has a good mechanical aortic valve click.  The PMI is not displaced.   Abdominal exam reveals good bowel sounds.  There is no guarding or rebound.  There is no hepatosplenomegaly or tenderness.  There are no masses.  Exam of the legs reveal no clubbing or cyanosis.there  is trace lower extremity edema.  The legs are without rashes.  The distal pulses are intact.  Cranial nerves II - XII are intact.  Motor and sensory functions are intact.  The gait is normal. ASSESSMENT AND PLAN

## 2011-10-27 ENCOUNTER — Ambulatory Visit (INDEPENDENT_AMBULATORY_CARE_PROVIDER_SITE_OTHER): Payer: BC Managed Care – PPO | Admitting: *Deleted

## 2011-10-27 DIAGNOSIS — Z952 Presence of prosthetic heart valve: Secondary | ICD-10-CM

## 2011-10-27 DIAGNOSIS — I359 Nonrheumatic aortic valve disorder, unspecified: Secondary | ICD-10-CM

## 2011-10-27 DIAGNOSIS — I4891 Unspecified atrial fibrillation: Secondary | ICD-10-CM

## 2011-10-27 DIAGNOSIS — I350 Nonrheumatic aortic (valve) stenosis: Secondary | ICD-10-CM

## 2011-10-27 DIAGNOSIS — Z7901 Long term (current) use of anticoagulants: Secondary | ICD-10-CM

## 2011-10-27 DIAGNOSIS — Z954 Presence of other heart-valve replacement: Secondary | ICD-10-CM

## 2011-10-27 LAB — POCT INR: INR: 2.2

## 2011-11-17 ENCOUNTER — Ambulatory Visit (INDEPENDENT_AMBULATORY_CARE_PROVIDER_SITE_OTHER): Payer: BC Managed Care – PPO | Admitting: *Deleted

## 2011-11-17 DIAGNOSIS — I350 Nonrheumatic aortic (valve) stenosis: Secondary | ICD-10-CM

## 2011-11-17 DIAGNOSIS — I359 Nonrheumatic aortic valve disorder, unspecified: Secondary | ICD-10-CM

## 2011-11-17 DIAGNOSIS — Z7901 Long term (current) use of anticoagulants: Secondary | ICD-10-CM

## 2011-11-17 DIAGNOSIS — Z954 Presence of other heart-valve replacement: Secondary | ICD-10-CM

## 2011-11-17 DIAGNOSIS — Z952 Presence of prosthetic heart valve: Secondary | ICD-10-CM

## 2011-11-17 DIAGNOSIS — I4891 Unspecified atrial fibrillation: Secondary | ICD-10-CM

## 2011-11-17 LAB — POCT INR: INR: 2.9

## 2011-12-15 ENCOUNTER — Ambulatory Visit (INDEPENDENT_AMBULATORY_CARE_PROVIDER_SITE_OTHER): Payer: BC Managed Care – PPO | Admitting: *Deleted

## 2011-12-15 DIAGNOSIS — Z954 Presence of other heart-valve replacement: Secondary | ICD-10-CM

## 2011-12-15 DIAGNOSIS — I359 Nonrheumatic aortic valve disorder, unspecified: Secondary | ICD-10-CM

## 2011-12-15 DIAGNOSIS — I4891 Unspecified atrial fibrillation: Secondary | ICD-10-CM

## 2011-12-15 DIAGNOSIS — I350 Nonrheumatic aortic (valve) stenosis: Secondary | ICD-10-CM

## 2011-12-15 DIAGNOSIS — Z7901 Long term (current) use of anticoagulants: Secondary | ICD-10-CM

## 2011-12-15 DIAGNOSIS — Z952 Presence of prosthetic heart valve: Secondary | ICD-10-CM

## 2011-12-15 LAB — POCT INR: INR: 2.7

## 2011-12-28 HISTORY — PX: FOOT SURGERY: SHX648

## 2012-01-11 ENCOUNTER — Encounter: Payer: Self-pay | Admitting: Cardiology

## 2012-01-11 ENCOUNTER — Ambulatory Visit (INDEPENDENT_AMBULATORY_CARE_PROVIDER_SITE_OTHER): Payer: Medicare Other | Admitting: Cardiology

## 2012-01-11 ENCOUNTER — Ambulatory Visit (INDEPENDENT_AMBULATORY_CARE_PROVIDER_SITE_OTHER): Payer: Medicare Other | Admitting: *Deleted

## 2012-01-11 VITALS — BP 126/78 | HR 61 | Ht 60.75 in | Wt 148.6 lb

## 2012-01-11 DIAGNOSIS — I10 Essential (primary) hypertension: Secondary | ICD-10-CM

## 2012-01-11 DIAGNOSIS — I359 Nonrheumatic aortic valve disorder, unspecified: Secondary | ICD-10-CM

## 2012-01-11 DIAGNOSIS — Z954 Presence of other heart-valve replacement: Secondary | ICD-10-CM

## 2012-01-11 DIAGNOSIS — I350 Nonrheumatic aortic (valve) stenosis: Secondary | ICD-10-CM

## 2012-01-11 DIAGNOSIS — I4891 Unspecified atrial fibrillation: Secondary | ICD-10-CM

## 2012-01-11 DIAGNOSIS — Z952 Presence of prosthetic heart valve: Secondary | ICD-10-CM

## 2012-01-11 DIAGNOSIS — Z7901 Long term (current) use of anticoagulants: Secondary | ICD-10-CM

## 2012-01-11 LAB — POCT INR: INR: 2.3

## 2012-01-11 NOTE — Assessment & Plan Note (Signed)
She is a good mechanical aortic valve click by exam. Her INR is therapeutic today. We will continue on Coumadin therapy. She will need routine SBE prophylaxis.

## 2012-01-11 NOTE — Assessment & Plan Note (Signed)
No recurrence in over 5 years. She is now off amiodarone. We'll continue to monitor.

## 2012-01-11 NOTE — Progress Notes (Signed)
HPI [name] is seen for followup today. She reports that she is doing well. We stopped her amiodarone on her last visit because she was experiencing symptoms of clumsiness and disequilibrium. She has had 2 falls since then. On one occasion she tripped on a curb. On another she tripped over her dog's leash. She's had no dizziness or syncope. She denies any palpitations or recurrent chest pain.  Allergies  Allergen Reactions  . Demerol   . Ivp Dye (Iodinated Diagnostic Agents)   . Meperidine Hcl     Current Outpatient Prescriptions on File Prior to Visit  Medication Sig Dispense Refill  . Ascorbic Acid (VITAMIN C) 1000 MG tablet Take 1,000 mg by mouth 3 (three) times daily.        Marland Kitchen aspirin 81 MG tablet Take 81 mg by mouth daily.        . Calcium Citrate-Vitamin D (CITRACAL + D PO) Take by mouth daily.        . Cholecalciferol (VITAMIN D3) 1000 UNITS tablet Take 1,000 Units by mouth daily.        . Cyanocobalamin (VITAMIN B-12) 2500 MCG SUBL Place under the tongue daily.        . hydrochlorothiazide 25 MG tablet Take 1 tablet (25 mg total) by mouth daily.  90 tablet  3  . losartan (COZAAR) 100 MG tablet Take 100 mg by mouth daily.        . metoCLOPramide (REGLAN) 5 MG tablet Take 5 mg by mouth daily.        . Multiple Vitamin (MULTIVITAMIN) tablet Take 1 tablet by mouth daily.        . pantoprazole (PROTONIX) 40 MG tablet Take 40 mg by mouth daily.        . simvastatin (ZOCOR) 40 MG tablet Take 20 mg by mouth at bedtime.       . tolterodine (DETROL LA) 4 MG 24 hr capsule Take 4 mg by mouth daily.        . Warfarin Sodium (COUMADIN PO) Take by mouth as directed.          Past Medical History  Diagnosis Date  . Aortic stenosis, severe   . HTN (hypertension)   . Hypercholesteremia   . PVC's (premature ventricular contractions)   . A-fib   . Venous varices    . GERD (gastroesophageal reflux disease)   . Breast ca 2004  . Chest pain   . Herpes zoster infection     History of herpes  zoster infection  . Hypercholesterolemia   . Obesity   . History of kidney stones   . History of bronchitis   . Mucositis     Severe mucositis  . Irritable bowel syndrome   . History of hiatal hernia     Past Surgical History  Procedure Date  . Aortic valve replacement 04/13/06    St. Jude mechanical conduit  . Total abdominal hysterectomy   . Cystocele repair   . Rectocele repair   . Appendectomy   . Kidney stone surgery     Family History  Problem Relation Age of Onset  . Heart disease Mother   . Heart disease Father 58    CABG  . Other Brother     AVR    History   Social History  . Marital Status: Married    Spouse Name: N/A    Number of Children: 3  . Years of Education: N/A   Occupational History  . bakery/caterer Other  retired   Social History Main Topics  . Smoking status: Never Smoker   . Smokeless tobacco: Not on file  . Alcohol Use: No  . Drug Use: No  . Sexually Active: Not on file   Other Topics Concern  . Not on file   Social History Narrative  . No narrative on file    ROS  review of systems is positive for sensation of clumsiness and poor balance. She has some leg weakness. This has been present for the past year. In the past. She does complain of a dry mouth ever sent she was placed on Reglan. All other systems were reviewed and are negative.  PHYSICAL EXAM BP 126/78  Pulse 61  Ht 5' 0.75" (1.543 m)  Wt 148 lb 9.6 oz (67.405 kg)  BMI 28.31 kg/m2 The patient is alert and oriented x 3.  The mood and affect are normal.  The skin is warm and dry.  Color is normal.  The HEENT exam reveals that the sclera are nonicteric.  The mucous membranes are moist.  The carotids are 2+ without bruits.  There is no thyromegaly.  There is no JVD.  The lungs are clear.  The chest wall is non tender.  The heart exam reveals a regular rate with a normal S1 and split S2.  She has a good mechanical aortic valve click.  The PMI is not displaced.   Abdominal  exam reveals good bowel sounds.  There is no guarding or rebound.  There is no hepatosplenomegaly or tenderness.  There are no masses.  Exam of the legs reveal no clubbing or cyanosis.there is trace lower extremity edema.  The legs are without rashes.  The distal pulses are intact.  Cranial nerves II - XII are intact.  Motor and sensory functions are intact.  The gait is normal. ASSESSMENT AND PLAN   ECG today demonstrates normal sinus rhythm with occasional PVCs and a left bundle branch block.

## 2012-01-11 NOTE — Patient Instructions (Signed)
Continue your current medication.  Follow up with the coumadin clinic.  I will see you again in 6 months.

## 2012-01-11 NOTE — Assessment & Plan Note (Signed)
Blood pressure is well-controlled on exam.

## 2012-02-08 ENCOUNTER — Ambulatory Visit (INDEPENDENT_AMBULATORY_CARE_PROVIDER_SITE_OTHER): Payer: Medicare Other | Admitting: Pharmacist

## 2012-02-08 DIAGNOSIS — Z954 Presence of other heart-valve replacement: Secondary | ICD-10-CM

## 2012-02-08 DIAGNOSIS — I359 Nonrheumatic aortic valve disorder, unspecified: Secondary | ICD-10-CM

## 2012-02-08 DIAGNOSIS — I4891 Unspecified atrial fibrillation: Secondary | ICD-10-CM

## 2012-02-08 DIAGNOSIS — Z952 Presence of prosthetic heart valve: Secondary | ICD-10-CM

## 2012-02-08 DIAGNOSIS — I350 Nonrheumatic aortic (valve) stenosis: Secondary | ICD-10-CM

## 2012-02-08 DIAGNOSIS — Z7901 Long term (current) use of anticoagulants: Secondary | ICD-10-CM

## 2012-02-08 LAB — POCT INR: INR: 2.5

## 2012-02-09 ENCOUNTER — Ambulatory Visit: Payer: Self-pay | Admitting: Cardiovascular Disease

## 2012-02-09 DIAGNOSIS — Z7901 Long term (current) use of anticoagulants: Secondary | ICD-10-CM

## 2012-02-09 DIAGNOSIS — Z954 Presence of other heart-valve replacement: Secondary | ICD-10-CM

## 2012-02-09 DIAGNOSIS — Z952 Presence of prosthetic heart valve: Secondary | ICD-10-CM

## 2012-02-09 DIAGNOSIS — I4891 Unspecified atrial fibrillation: Secondary | ICD-10-CM

## 2012-02-18 ENCOUNTER — Other Ambulatory Visit: Payer: Self-pay | Admitting: *Deleted

## 2012-02-18 DIAGNOSIS — I10 Essential (primary) hypertension: Secondary | ICD-10-CM

## 2012-02-18 MED ORDER — HYDROCHLOROTHIAZIDE 25 MG PO TABS: 25.0000 mg | ORAL_TABLET | Freq: Every day | ORAL | Status: DC

## 2012-02-18 NOTE — Telephone Encounter (Signed)
Refilled hctz 

## 2012-03-07 ENCOUNTER — Encounter: Payer: Medicare Other | Admitting: *Deleted

## 2012-03-30 ENCOUNTER — Other Ambulatory Visit: Payer: Self-pay | Admitting: Internal Medicine

## 2012-03-30 DIAGNOSIS — Z1231 Encounter for screening mammogram for malignant neoplasm of breast: Secondary | ICD-10-CM

## 2012-05-05 ENCOUNTER — Ambulatory Visit
Admission: RE | Admit: 2012-05-05 | Discharge: 2012-05-05 | Disposition: A | Discharge status: Home / Self Care | Payer: Medicare Other | Source: Ambulatory Visit | Attending: Internal Medicine | Admitting: Internal Medicine

## 2012-05-05 DIAGNOSIS — Z1231 Encounter for screening mammogram for malignant neoplasm of breast: Secondary | ICD-10-CM

## 2012-09-12 ENCOUNTER — Telehealth: Payer: Self-pay | Admitting: Cardiology

## 2012-09-12 NOTE — Telephone Encounter (Signed)
**Note De-Identified [place] Obfuscation** Pt. is 2 months overdue for f/u and needs cardiac clearance for foot surgery with Dr. Doran Durand that is scheduled for 9-24.

## 2012-09-12 NOTE — Telephone Encounter (Signed)
Pt calling re status of surgical clearance, from dr hewitt for foot surgery, pt [phone]

## 2012-09-13 NOTE — Telephone Encounter (Signed)
[name] from [place] office called, left message on her voice mail I received surgical clearance form.Dr.Jordan has been out of office,will have him sign form today and will fax back today 09/13/12.

## 2012-09-13 NOTE — Telephone Encounter (Signed)
F/U  Surgery on next Tuesday . Need approved from Dr. [place]. Fax was sent last week Dr. Doran Durand office # [phone].  Speak to [facility].

## 2012-09-14 NOTE — Telephone Encounter (Signed)
F/U   Patient calling back to speak with nurse. Regarding bridge Lovenox for upcoming surgery.

## 2012-09-14 NOTE — Telephone Encounter (Signed)
Surgery clearance form signed by Dr.Jordan and  faxed to Dr.Hewitt's office 09/13/12.

## 2012-09-14 NOTE — Telephone Encounter (Signed)
Spoke to patient was told Dr.Jordan cleared her for surgery,will need to have lovenox bridging.Spoke to DJ at [facility] office they will manage the lovenox bridge,was told surgery 09/19/12, patient will need to start bridge today 09/13/12,  call Judeen Hammans at [place] office to let her know bridge can be started today.

## 2013-01-23 ENCOUNTER — Encounter: Payer: Self-pay | Admitting: Cardiology

## 2013-01-23 ENCOUNTER — Ambulatory Visit (INDEPENDENT_AMBULATORY_CARE_PROVIDER_SITE_OTHER): Payer: Medicare Other | Admitting: Cardiology

## 2013-01-23 VITALS — BP 110/74 | HR 76 | Ht 60.0 in | Wt 149.0 lb

## 2013-01-23 DIAGNOSIS — Z954 Presence of other heart-valve replacement: Secondary | ICD-10-CM

## 2013-01-23 DIAGNOSIS — Z7901 Long term (current) use of anticoagulants: Secondary | ICD-10-CM

## 2013-01-23 DIAGNOSIS — Z952 Presence of prosthetic heart valve: Secondary | ICD-10-CM

## 2013-01-23 DIAGNOSIS — I493 Ventricular premature depolarization: Secondary | ICD-10-CM

## 2013-01-23 DIAGNOSIS — I10 Essential (primary) hypertension: Secondary | ICD-10-CM

## 2013-01-23 DIAGNOSIS — I4949 Other premature depolarization: Secondary | ICD-10-CM

## 2013-01-23 MED ORDER — HYDROCHLOROTHIAZIDE 25 MG PO TABS: 25.0000 mg | ORAL_TABLET | Freq: Every day | ORAL | Status: DC

## 2013-01-23 NOTE — Patient Instructions (Signed)
Continue your current medication  I will see you in one year.

## 2013-01-23 NOTE — Progress Notes (Signed)
HPI [name] is seen for followup today. She has a history of aortic valve disease and is status post aortic valve replacement with mechanical prosthesis in 2007. She is on chronic anticoagulation. She has remote history of atrial fibrillation without recurrence over the past 6 years. She has a history of hypertension. On followup today she states she is doing very well. She did have foot surgery in September and this has limited her activity. She does have a history of several falls in the past but has had no recent falls. She denies any dyspnea, palpitations, or chest pain.  Allergies  Allergen Reactions  . Demerol   . Ivp Dye (Iodinated Diagnostic Agents)   . Meperidine Hcl     Current Outpatient Prescriptions on File Prior to Visit  Medication Sig Dispense Refill  . Ascorbic Acid (VITAMIN C) 1000 MG tablet Take 1,000 mg by mouth 3 (three) times daily.        Marland Kitchen aspirin 81 MG tablet Take 81 mg by mouth daily.       . Calcium Citrate-Vitamin D (CITRACAL + D PO) Take by mouth daily.        . Cholecalciferol (VITAMIN D3) 1000 UNITS tablet Take 2,000 Units by mouth daily.       . Cyanocobalamin (VITAMIN B-12) 2500 MCG SUBL Place under the tongue daily.       . hydrochlorothiazide (HYDRODIURIL) 25 MG tablet Take 1 tablet (25 mg total) by mouth daily.  90 tablet  0  . losartan (COZAAR) 100 MG tablet Take 100 mg by mouth daily.        . metoCLOPramide (REGLAN) 5 MG tablet Take 5 mg by mouth daily.        . Multiple Vitamin (MULTIVITAMIN) tablet Take 1 tablet by mouth daily.        . pantoprazole (PROTONIX) 40 MG tablet Take 40 mg by mouth daily.        . simvastatin (ZOCOR) 40 MG tablet Take 20 mg by mouth at bedtime.       . tolterodine (DETROL LA) 4 MG 24 hr capsule Take 4 mg by mouth daily.        . Warfarin Sodium (COUMADIN PO) Take by mouth as directed.          Past Medical History  Diagnosis Date  . Aortic stenosis, severe   . HTN (hypertension)   . Hypercholesteremia   . PVC's  (premature ventricular contractions)   . A-fib   . Venous varices    . GERD (gastroesophageal reflux disease)   . Breast CA 2004  . Chest pain   . Herpes zoster infection     History of herpes zoster infection  . Hypercholesterolemia   . Obesity   . History of kidney stones   . History of bronchitis   . Mucositis     Severe mucositis  . Irritable bowel syndrome   . History of hiatal hernia     Past Surgical History  Procedure Date  . Aortic valve replacement 04/13/06    St. Jude mechanical conduit  . Total abdominal hysterectomy   . Cystocele repair   . Rectocele repair   . Appendectomy   . Kidney stone surgery   . Foot surgery     Family History  Problem Relation Age of Onset  . Heart disease Mother   . Heart disease Father 61    CABG  . Other Brother     AVR    History  Social History  . Marital Status: Married    Spouse Name: N/A    Number of Children: 3  . Years of Education: N/A   Occupational History  . bakery/caterer Other    retired   Social History Main Topics  . Smoking status: Never Smoker   . Smokeless tobacco: Not on file  . Alcohol Use: No  . Drug Use: No  . Sexually Active: Not on file   Other Topics Concern  . Not on file   Social History Narrative  . No narrative on file    ROS  as noted in history of present illness. All other systems were reviewed and are negative.  PHYSICAL EXAM BP 110/74  Pulse 76  Ht 5' (1.524 m)  Wt 149 lb (67.586 kg)  BMI 29.10 kg/m2 The patient is alert and oriented x 3.  The mood and affect are normal.  The skin is warm and dry.   The HEENT exam is normal. The carotids are 2+ without bruits.  There is no thyromegaly.  There is no JVD.  The lungs are clear.  The chest wall is non tender.  The heart exam reveals an irregular rate with a normal S1 and split S2.  She has a good mechanical aortic valve click.  The PMI is not displaced.   Abdominal exam reveals good bowel sounds.  There is no guarding or  rebound.  There is no hepatosplenomegaly or tenderness.  There are no masses.  Exam of the legs reveal no clubbing or cyanosis.there is trace lower extremity edema.  The legs are without rashes.  The distal pulses are intact.  Cranial nerves II - XII are intact.  Motor and sensory functions are intact.  The gait is normal.  Laboratory data: ECG today demonstrates normal sinus rhythm with frequent PVCs in a pattern of bigeminy. There is an incomplete left bundle branch block.  ASSESSMENT AND PLAN  1. Status post mechanical aortic valve replacement. Valve sounds are crisp today. She is on chronic anticoagulation with Coumadin that is being monitored by her primary care.  2. Hypertension, controlled.  3. ECCs, chronic. Patient is asymptomatic.

## 2013-04-09 ENCOUNTER — Other Ambulatory Visit: Payer: Self-pay

## 2013-04-09 DIAGNOSIS — Z1231 Encounter for screening mammogram for malignant neoplasm of breast: Secondary | ICD-10-CM

## 2013-05-22 ENCOUNTER — Ambulatory Visit
Admission: RE | Admit: 2013-05-22 | Discharge: 2013-05-22 | Disposition: A | Discharge status: Home / Self Care | Payer: Medicare Other | Source: Ambulatory Visit

## 2013-05-22 ENCOUNTER — Ambulatory Visit: Payer: Medicare Other

## 2013-05-22 DIAGNOSIS — Z1231 Encounter for screening mammogram for malignant neoplasm of breast: Secondary | ICD-10-CM

## 2013-12-16 ENCOUNTER — Encounter (HOSPITAL_COMMUNITY): Payer: Self-pay | Admitting: Emergency Medicine

## 2013-12-16 ENCOUNTER — Emergency Department (HOSPITAL_COMMUNITY)
Admission: EM | Admit: 2013-12-16 | Discharge: 2013-12-16 | Disposition: A | Discharge status: Home / Self Care | Payer: Medicare Other | Attending: Emergency Medicine | Admitting: Emergency Medicine

## 2013-12-16 ENCOUNTER — Emergency Department (HOSPITAL_COMMUNITY): Payer: Medicare Other

## 2013-12-16 DIAGNOSIS — Z79899 Other long term (current) drug therapy: Secondary | ICD-10-CM | POA: Insufficient documentation

## 2013-12-16 DIAGNOSIS — E7800 Pure hypercholesterolemia, unspecified: Secondary | ICD-10-CM | POA: Insufficient documentation

## 2013-12-16 DIAGNOSIS — Y9289 Other specified places as the place of occurrence of the external cause: Secondary | ICD-10-CM | POA: Insufficient documentation

## 2013-12-16 DIAGNOSIS — S92309A Fracture of unspecified metatarsal bone(s), unspecified foot, initial encounter for closed fracture: Secondary | ICD-10-CM | POA: Insufficient documentation

## 2013-12-16 DIAGNOSIS — X500XXA Overexertion from strenuous movement or load, initial encounter: Secondary | ICD-10-CM | POA: Insufficient documentation

## 2013-12-16 DIAGNOSIS — Z853 Personal history of malignant neoplasm of breast: Secondary | ICD-10-CM | POA: Insufficient documentation

## 2013-12-16 DIAGNOSIS — Z954 Presence of other heart-valve replacement: Secondary | ICD-10-CM | POA: Insufficient documentation

## 2013-12-16 DIAGNOSIS — I10 Essential (primary) hypertension: Secondary | ICD-10-CM | POA: Insufficient documentation

## 2013-12-16 DIAGNOSIS — E669 Obesity, unspecified: Secondary | ICD-10-CM | POA: Insufficient documentation

## 2013-12-16 DIAGNOSIS — S92302A Fracture of unspecified metatarsal bone(s), left foot, initial encounter for closed fracture: Secondary | ICD-10-CM

## 2013-12-16 DIAGNOSIS — K219 Gastro-esophageal reflux disease without esophagitis: Secondary | ICD-10-CM | POA: Insufficient documentation

## 2013-12-16 DIAGNOSIS — Z8709 Personal history of other diseases of the respiratory system: Secondary | ICD-10-CM | POA: Insufficient documentation

## 2013-12-16 DIAGNOSIS — Y9389 Activity, other specified: Secondary | ICD-10-CM | POA: Insufficient documentation

## 2013-12-16 DIAGNOSIS — I4891 Unspecified atrial fibrillation: Secondary | ICD-10-CM | POA: Insufficient documentation

## 2013-12-16 DIAGNOSIS — Z8619 Personal history of other infectious and parasitic diseases: Secondary | ICD-10-CM | POA: Insufficient documentation

## 2013-12-16 DIAGNOSIS — Z87442 Personal history of urinary calculi: Secondary | ICD-10-CM | POA: Insufficient documentation

## 2013-12-16 DIAGNOSIS — Z7982 Long term (current) use of aspirin: Secondary | ICD-10-CM | POA: Insufficient documentation

## 2013-12-16 DIAGNOSIS — Z7901 Long term (current) use of anticoagulants: Secondary | ICD-10-CM | POA: Insufficient documentation

## 2013-12-16 DIAGNOSIS — Z9889 Other specified postprocedural states: Secondary | ICD-10-CM | POA: Insufficient documentation

## 2013-12-16 DIAGNOSIS — W010XXA Fall on same level from slipping, tripping and stumbling without subsequent striking against object, initial encounter: Secondary | ICD-10-CM | POA: Insufficient documentation

## 2013-12-16 LAB — BASIC METABOLIC PANEL
BUN: 20 mg/dL (ref 6–23)
CO2: 24 mEq/L (ref 19–32)
Calcium: 9.5 mg/dL (ref 8.4–10.5)
Chloride: 105 mEq/L (ref 96–112)
Creatinine, Ser: 1.34 mg/dL — ABNORMAL HIGH (ref 0.50–1.10)
GFR calc Af Amer: 44 mL/min — ABNORMAL LOW (ref >90)
GFR calc non Af Amer: 38 mL/min — ABNORMAL LOW (ref >90)
Glucose, Bld: 87 mg/dL (ref 70–99)
Potassium: 3.8 mEq/L (ref 3.5–5.1)
Sodium: 140 mEq/L (ref 135–145)

## 2013-12-16 LAB — PROTIME-INR
INR: 3.61 — ABNORMAL HIGH (ref 0.00–1.49)
Prothrombin Time: 34.6 seconds — ABNORMAL HIGH (ref 11.6–15.2)

## 2013-12-16 LAB — CBC WITH DIFFERENTIAL/PLATELET
Basophils Absolute: 0.1 10*3/uL (ref 0.0–0.1)
Basophils Relative: 1 % (ref 0–1)
Eosinophils Absolute: 0.2 10*3/uL (ref 0.0–0.7)
Eosinophils Relative: 5 % (ref 0–5)
HCT: 30.9 % — ABNORMAL LOW (ref 36.0–46.0)
Hemoglobin: 10.7 g/dL — ABNORMAL LOW (ref 12.0–15.0)
Lymphocytes Relative: 27 % (ref 12–46)
Lymphs Abs: 1.2 10*3/uL (ref 0.7–4.0)
MCH: 34.6 pg — ABNORMAL HIGH (ref 26.0–34.0)
MCHC: 34.6 g/dL (ref 30.0–36.0)
MCV: 100 fL (ref 78.0–100.0)
Monocytes Absolute: 0.4 10*3/uL (ref 0.1–1.0)
Monocytes Relative: 10 % (ref 3–12)
Neutro Abs: 2.5 10*3/uL (ref 1.7–7.7)
Neutrophils Relative %: 57 % (ref 43–77)
Platelets: 157 10*3/uL (ref 150–400)
RBC: 3.09 MIL/uL — ABNORMAL LOW (ref 3.87–5.11)
RDW: 13.3 % (ref 11.5–15.5)
WBC: 4.3 10*3/uL (ref 4.0–10.5)

## 2013-12-16 MED ORDER — HYDROCODONE-ACETAMINOPHEN 5-325 MG PO TABS: 1.0000 | ORAL_TABLET | ORAL | Status: DC | PRN

## 2013-12-16 MED ORDER — ONDANSETRON HCL 4 MG/2ML IJ SOLN: 4.0000 mg | Freq: Once | INTRAMUSCULAR | Status: DC

## 2013-12-16 MED ORDER — ALBUTEROL SULFATE (5 MG/ML) 0.5% IN NEBU
5.0000 mg | INHALATION_SOLUTION | Freq: Once | RESPIRATORY_TRACT | Status: DC
  Filled 2013-12-16: qty 1

## 2013-12-16 NOTE — ED Provider Notes (Signed)
CSN: [id number]     Arrival date & time 12/16/13  0740 History   First MD Initiated Contact with Patient 12/16/13 [phone]     Chief Complaint  Patient presents with  . Foot Pain    left   (Consider location/radiation/quality/duration/timing/severity/associated sxs/prior Treatment) Patient is a 72 y.o. female presenting with lower extremity pain. The history is provided by the patient and the spouse. No language interpreter was used.  Foot Pain This is a new problem. The current episode started in the past 7 days. Pertinent negatives include no chills, fever or numbness. Associated symptoms comments: Pain and swelling in left foot that has become progressively worse over the last 2-3 days, with redness developing yesterday. She reports falling after tripping on some boxes last week (4 days ago) but is not aware of any injury to the foot specifically. She has been ambulatory since that time. No other pain or injury..    Past Medical History  Diagnosis Date  . Aortic stenosis, severe   . HTN (hypertension)   . Hypercholesteremia   . PVC's (premature ventricular contractions)   . A-fib   . Venous varices    . GERD (gastroesophageal reflux disease)   . Breast CA 2004  . Chest pain   . Herpes zoster infection     History of herpes zoster infection  . Hypercholesterolemia   . Obesity   . History of kidney stones   . History of bronchitis   . Mucositis     Severe mucositis  . Irritable bowel syndrome   . History of hiatal hernia    Past Surgical History  Procedure Laterality Date  . Aortic valve replacement  04/13/06    St. Jude mechanical conduit  . Total abdominal hysterectomy    . Cystocele repair    . Rectocele repair    . Appendectomy    . Kidney stone surgery    . Foot surgery     Family History  Problem Relation Age of Onset  . Heart disease Mother   . Heart disease Father 10    CABG  . Other Brother     AVR   History  Substance Use Topics  . Smoking status: Never  Smoker   . Smokeless tobacco: Not on file  . Alcohol Use: No   OB History   Grav Para Term Preterm Abortions TAB SAB Ect Mult Living                 Review of Systems  Constitutional: Negative for fever and chills.  Musculoskeletal:       See HPI. No pain in ankle, calf or upper leg on left. No back pain.  Skin: Positive for color change.  Neurological: Negative.  Negative for numbness.    Allergies  Demerol; Ivp dye; and Meperidine hcl  Home Medications   Current Outpatient Rx  Name  Route  Sig  Dispense  Refill  . Ascorbic Acid (VITAMIN C) 1000 MG tablet   Oral   Take 1,000 mg by mouth 3 (three) times daily.           Marland Kitchen aspirin 81 MG tablet   Oral   Take 81 mg by mouth daily.          . Calcium Citrate-Vitamin D (CITRACAL + D PO)   Oral   Take by mouth daily.           . Cholecalciferol (VITAMIN D3) 1000 UNITS tablet   Oral   Take  2,000 Units by mouth daily.          . Cyanocobalamin (VITAMIN B-12) 2500 MCG SUBL   Sublingual   Place under the tongue daily.          . hydrochlorothiazide (HYDRODIURIL) 25 MG tablet   Oral   Take 1 tablet (25 mg total) by mouth daily.   90 tablet   0   . losartan (COZAAR) 100 MG tablet   Oral   Take 100 mg by mouth daily.           . metoCLOPramide (REGLAN) 5 MG tablet   Oral   Take 5 mg by mouth daily.           . Multiple Vitamin (MULTIVITAMIN) tablet   Oral   Take 1 tablet by mouth daily.           . pantoprazole (PROTONIX) 40 MG tablet   Oral   Take 40 mg by mouth daily.           . simvastatin (ZOCOR) 40 MG tablet   Oral   Take 20 mg by mouth at bedtime.          . tolterodine (DETROL LA) 4 MG 24 hr capsule   Oral   Take 4 mg by mouth daily.           . Warfarin Sodium (COUMADIN PO)   Oral   Take by mouth as directed.            BP 126/59  Pulse 66  Temp(Src) 98.5 F (36.9 C) (Oral)  Resp 12  SpO2 100% Physical Exam  Constitutional: She is oriented to person, place, and  time. She appears well-developed and well-nourished.  Neck: Normal range of motion.  Cardiovascular: Intact distal pulses.   Pulmonary/Chest: Effort normal.  Musculoskeletal: She exhibits tenderness.  Left foot mildly swollen. No bony deformities. Tender to dorsal forefoot. FROM with pain to digits of foot. Ankle without swelling, tenderness or loss of motion. No calf tenderness.   Neurological: She is alert and oriented to person, place, and time. Coordination normal.  Skin: Skin is warm and dry.  Dorsum of foot is red without significant warmth. Favor ecchymosis vs infection.  Psychiatric: She has a normal mood and affect.    ED Course  Procedures (including critical care time) Labs Review Labs Reviewed  CBC WITH DIFFERENTIAL  BASIC METABOLIC PANEL  PROTIME-INR   Imaging Review No results found.  EKG Interpretation   None       MDM  No diagnosis found. 1. Fracture 3rd MT  Discussed findings with patient and spouse. Post-op boot given, recommended walker when ambulating to prevent fall. Follow up with Dr. Doran Durand.    Dewaine Oats, PA-C 12/16/13 [phone]

## 2013-12-16 NOTE — ED Notes (Signed)
Pt twisted left ankle Thursday night now tripping over boxes and falling on butt pt states pain in foot has progressively gotten worse. Pt able to move toes slightly but not at normal, c/o pain with movement. Has reddened spot on top of foot

## 2013-12-18 NOTE — ED Provider Notes (Signed)
Medical screening examination/treatment/procedure(s) were conducted as a shared visit with non-physician practitioner(s) and myself.  I personally evaluated the patient during the encounter.  EKG Interpretation   None       S/p recent mechanical fall, c/o left foot pain/swelling. Tenderness mid left foot. Distal pulses palp. Mild bruising to foot. No increased warmth/cellulitis. Denies head injury. No loc. No headache. No neck or back pain. Denies any other pain or injury.     Mirna Mires, MD 12/18/13 [phone]

## 2013-12-27 HISTORY — PX: CATARACT EXTRACTION W/ INTRAOCULAR LENS  IMPLANT, BILATERAL: SHX1307

## 2014-01-07 ENCOUNTER — Ambulatory Visit (INDEPENDENT_AMBULATORY_CARE_PROVIDER_SITE_OTHER): Payer: Medicare HMO | Admitting: Cardiology

## 2014-01-07 ENCOUNTER — Encounter: Payer: Self-pay | Admitting: Cardiology

## 2014-01-07 VITALS — BP 130/60 | HR 76 | Ht 60.0 in | Wt 146.0 lb

## 2014-01-07 DIAGNOSIS — Z952 Presence of prosthetic heart valve: Secondary | ICD-10-CM

## 2014-01-07 DIAGNOSIS — I10 Essential (primary) hypertension: Secondary | ICD-10-CM

## 2014-01-07 DIAGNOSIS — Z7901 Long term (current) use of anticoagulants: Secondary | ICD-10-CM

## 2014-01-07 DIAGNOSIS — E7800 Pure hypercholesterolemia, unspecified: Secondary | ICD-10-CM

## 2014-01-07 DIAGNOSIS — Z954 Presence of other heart-valve replacement: Secondary | ICD-10-CM

## 2014-01-07 NOTE — Patient Instructions (Signed)
Stop taking ASA   Continue your other therapy  I will see you in one year.

## 2014-01-07 NOTE — Progress Notes (Signed)
HPI [name] is seen for followup today. She has a history of aortic valve disease and is status post aortic valve replacement with mechanical St Jude's prosthesis in 2007. She is on chronic anticoagulation. She has remote history of atrial fibrillation without recurrence over the past 7 years. She has a history of hypertension. On followup today she states she is doing very well.  She does have a history of several falls in the past and fractured a metatarsal in December. She denies any dyspnea, palpitations, or chest pain. Her last Echo in October 2012 showed global hypokinesis with EF 45-50%. Normal AV prosthesis function with mild AI and MR.   Allergies  Allergen Reactions  . Demerol Shortness Of Breath and Swelling  . Meperidine Hcl Shortness Of Breath and Swelling  . Ivp Dye [Iodinated Diagnostic Agents] Hives    OK with benadryl    Current Outpatient Prescriptions on File Prior to Visit  Medication Sig Dispense Refill  . acetaminophen (TYLENOL) 500 MG tablet Take 500-1,000 mg by mouth every 6 (six) hours as needed for mild pain or moderate pain.      . Ascorbic Acid (VITAMIN C) 1000 MG tablet Take 1,000 mg by mouth 3 (three) times daily.        . calcium citrate-vitamin D (CITRACAL+D) 315-200 MG-UNIT per tablet Take 1 tablet by mouth 3 (three) times daily.      . Cholecalciferol (VITAMIN D3) 1000 UNITS tablet Take 2,000 Units by mouth daily.       . hydrochlorothiazide (HYDRODIURIL) 25 MG tablet Take 25 mg by mouth every morning.      Marland Kitchen losartan (COZAAR) 100 MG tablet Take 100 mg by mouth every morning.       . metoCLOPramide (REGLAN) 5 MG tablet Take 5 mg by mouth every morning.       . Multiple Vitamin (MULTIVITAMIN) tablet Take 1 tablet by mouth daily at 12 noon.       . pantoprazole (PROTONIX) 40 MG tablet Take 40 mg by mouth daily at 12 noon.       . raloxifene (EVISTA) 60 MG tablet Take 60 mg by mouth daily at 12 noon.      . simvastatin (ZOCOR) 20 MG tablet Take 20 mg by mouth  every evening.      . vitamin B-12 (CYANOCOBALAMIN) 1000 MCG tablet Take 1,000 mcg by mouth every morning.      . warfarin (COUMADIN) 5 MG tablet Take 5 mg by mouth every evening.       No current facility-administered medications on file prior to visit.    Past Medical History  Diagnosis Date  . Aortic stenosis, severe   . HTN (hypertension)   . Hypercholesteremia   . PVC's (premature ventricular contractions)   . A-fib   . Venous varices    . GERD (gastroesophageal reflux disease)   . Breast CA 2004  . Chest pain   . Herpes zoster infection     History of herpes zoster infection  . Hypercholesterolemia   . Obesity   . History of kidney stones   . History of bronchitis   . Mucositis     Severe mucositis  . Irritable bowel syndrome   . History of hiatal hernia     Past Surgical History  Procedure Laterality Date  . Aortic valve replacement  04/13/06    St. Jude mechanical conduit  . Total abdominal hysterectomy    . Cystocele repair    . Rectocele repair    .  Appendectomy    . Kidney stone surgery    . Foot surgery      Family History  Problem Relation Age of Onset  . Heart disease Mother   . Heart disease Father 56    CABG  . Other Brother     AVR    History   Social History  . Marital Status: Married    Spouse Name: N/A    Number of Children: 3  . Years of Education: N/A   Occupational History  . bakery/caterer Other    retired   Social History Main Topics  . Smoking status: Never Smoker   . Smokeless tobacco: Not on file  . Alcohol Use: No  . Drug Use: No  . Sexual Activity: Not on file   Other Topics Concern  . Not on file   Social History Narrative  . No narrative on file    ROS As noted in history of present illness. All other systems were reviewed and are negative.  PHYSICAL EXAM BP 130/60  Pulse 76  Ht 5' (1.524 m)  Wt 146 lb (66.225 kg)  BMI 28.51 kg/m2 WD, elderly WF in NAD.  The HEENT exam is normal. The carotids are 2+  without bruits.  There is no thyromegaly.  There is no JVD.  The lungs are clear.  The chest wall is non tender.  The heart exam reveals an irregular rate with a normal S1 and split S2.  She has a good mechanical aortic valve click.  The PMI is not displaced.   Abdominal exam reveals good bowel sounds.  There is no guarding or rebound.  There is no hepatosplenomegaly or tenderness.  There are no masses.  Exam of the legs reveal no clubbing or cyanosis. She is wearing a boot on her left foot.   The distal pulses are intact.  Cranial nerves II - XII are intact.  Motor and sensory functions are intact.  The gait is normal.  Laboratory data: ECG today demonstrates normal sinus rhythm. There is an incomplete left bundle branch block.  ASSESSMENT AND PLAN  1. Status post mechanical aortic valve replacement. Valve sounds are crisp today. She is on chronic anticoagulation with Coumadin. I have recommended stopping ASA.  2. Hypertension, controlled.  3. NSIVCD. Patient is asymptomatic.

## 2014-01-23 ENCOUNTER — Ambulatory Visit: Payer: Medicare Other | Admitting: Cardiology

## 2014-04-15 ENCOUNTER — Other Ambulatory Visit: Payer: Self-pay

## 2014-04-15 DIAGNOSIS — Z1231 Encounter for screening mammogram for malignant neoplasm of breast: Secondary | ICD-10-CM

## 2014-05-23 ENCOUNTER — Ambulatory Visit
Admission: RE | Admit: 2014-05-23 | Discharge: 2014-05-23 | Disposition: A | Discharge status: Home / Self Care | Payer: Medicare HMO | Source: Ambulatory Visit

## 2014-05-23 ENCOUNTER — Encounter (INDEPENDENT_AMBULATORY_CARE_PROVIDER_SITE_OTHER): Payer: Self-pay

## 2014-05-23 DIAGNOSIS — Z1231 Encounter for screening mammogram for malignant neoplasm of breast: Secondary | ICD-10-CM

## 2014-07-22 ENCOUNTER — Telehealth: Payer: Self-pay | Admitting: Gastroenterology

## 2014-07-22 NOTE — Telephone Encounter (Signed)
Spoke with patient and she does not have a preference for new GI MD. Scheduled with .guenther on 07/29/14 at 2:00 PM.

## 2014-07-29 ENCOUNTER — Encounter: Payer: Self-pay | Admitting: Nurse Practitioner

## 2014-07-29 ENCOUNTER — Ambulatory Visit (INDEPENDENT_AMBULATORY_CARE_PROVIDER_SITE_OTHER): Payer: Commercial Managed Care - HMO | Admitting: Nurse Practitioner

## 2014-07-29 VITALS — BP 112/68 | HR 64 | Ht 59.75 in | Wt 149.0 lb

## 2014-07-29 DIAGNOSIS — K219 Gastro-esophageal reflux disease without esophagitis: Secondary | ICD-10-CM

## 2014-07-29 DIAGNOSIS — Z800 Family history of malignant neoplasm of digestive organs: Secondary | ICD-10-CM

## 2014-07-29 DIAGNOSIS — K449 Diaphragmatic hernia without obstruction or gangrene: Secondary | ICD-10-CM

## 2014-07-29 MED ORDER — MOVIPREP 100 G PO SOLR: 1.0000 | ORAL | Status: DC

## 2014-07-29 NOTE — Patient Instructions (Addendum)
You have been scheduled for an endoscopy and colonoscopy. Please follow the written instructions given to you at your visit today. Please pick up your prep at the pharmacy within the next 1-3 days. [place] If you use inhalers (even only as needed), please bring them with you on the day of your procedure. Your physician has requested that you go to www.startemmi.com and enter the access code given to you at your visit today. This web site gives a general overview about your procedure. However, you should still follow specific instructions given to you by our office regarding your preparation for the procedure.  Take the Protonix 40 mg in the morning. Get a trial of Pepcid complete to take at bedtime. Stop the Zantac.  We scheduled the Barium upper test at [facility], go to patient registration inside front door of hospital. Date is Friday 8-7.   Arrive at 9:15 am.  Have nothing by mouth after midnight.

## 2014-07-29 NOTE — Progress Notes (Signed)
HPI :   Patient is a 73 year old female, with multiple medical problems on multiple medications including Coumadin. Patient was previously cared for by Dr. Sharlett Iles for her history of GERD and [facility] of colon cancer. Patient has not been seen in approximately 4 years.   Ms. Gardner presents with complaints of heartburn. She is having breakthrough pyrosis on daily PPI plus a daily H2 blocker. Heartburn, worse at night. Patient is unable to elevate head of her bed, she does sleep on pillows. She complains of regurgitation. At times she regurgitates into nasal cavity.  Her last upper endoscopy was in 2002 with findings of a 4 cm hiatal hernia and an esophageal stricture that was dilated..   Past Medical History  Diagnosis Date  . Aortic stenosis, severe   . HTN (hypertension)   . Hypercholesteremia   . PVC's (premature ventricular contractions)   . A-fib   . Venous varices    . GERD (gastroesophageal reflux disease)   . Breast CA 2004  . Herpes zoster infection     History of herpes zoster infection  . Hypercholesterolemia   . Obesity   . History of kidney stones   . Mucositis     Severe mucositis  . Irritable bowel syndrome   . History of hiatal hernia     Family History  Problem Relation Age of Onset  . Heart disease Mother   . Heart disease Father 74    CABG  . Other Brother     AVR   History  Substance Use Topics  . Smoking status: Never Smoker   . Smokeless tobacco: Never Used  . Alcohol Use: No   Current Outpatient Prescriptions  Medication Sig Dispense Refill  . acetaminophen (TYLENOL) 500 MG tablet Take 500-1,000 mg by mouth every 6 (six) hours as needed for mild pain or moderate pain.      . Ascorbic Acid (VITAMIN C) 1000 MG tablet Take 1,000 mg by mouth 3 (three) times daily.        . calcium citrate-vitamin D (CITRACAL+D) 315-200 MG-UNIT per tablet Take 1 tablet by mouth 3 (three) times daily.      . Cholecalciferol (VITAMIN D3) 1000 UNITS tablet Take 2,000  Units by mouth daily.       Marland Kitchen losartan (COZAAR) 100 MG tablet Take 100 mg by mouth every morning.       . Multiple Vitamin (MULTIVITAMIN) tablet Take 1 tablet by mouth daily at 12 noon.       . Multiple Vitamins-Minerals (VISION-VITE PRESERVE PO) Take 1 tablet by mouth 2 (two) times daily.      Marland Kitchen oxybutynin (DITROPAN) 5 MG tablet Take 5 mg by mouth daily.      . pantoprazole (PROTONIX) 40 MG tablet Take 40 mg by mouth daily at 12 noon.       . ranitidine (ZANTAC) 150 MG tablet Take 150 mg by mouth daily.      . simvastatin (ZOCOR) 20 MG tablet Take 20 mg by mouth every evening.      . vitamin B-12 (CYANOCOBALAMIN) 1000 MCG tablet Take 1,000 mcg by mouth every morning.      . warfarin (COUMADIN) 5 MG tablet Take 5 mg by mouth every evening.      . hydrochlorothiazide (HYDRODIURIL) 25 MG tablet Take 25 mg by mouth every morning.       No current facility-administered medications for this visit.   Allergies  Allergen Reactions  . Demerol Shortness Of Breath  and Swelling  . Meperidine Hcl Shortness Of Breath and Swelling  . Ivp Dye [Iodinated Diagnostic Agents] Hives    OK with benadryl     Review of Systems: Positive for vision changes. All other systems reviewed and negative except where noted in HPI.    Physical Exam: BP 112/68  Pulse 64  Ht 4' 11.75" (1.518 m)  Wt 149 lb (67.586 kg)  BMI 29.33 kg/m2 Constitutional: Pleasant,well-developed, white female in no acute distress. HEENT: Normocephalic and atraumatic. Conjunctivae are normal. No scleral icterus. Neck supple.  Cardiovascular: Normal rate, regular rhythm. Mechanical valve click Pulmonary/chest: Effort normal and breath sounds normal. No wheezing, rales or rhonchi. Abdominal: Soft, nondistended, nontender. Bowel sounds active throughout. There are no masses palpable. No hepatomegaly. Extremities: no edema Lymphadenopathy: No cervical adenopathy noted. Neurological: Alert and oriented to person place and time. Skin:  Skin is warm and dry. No rashes noted. Psychiatric: Normal mood and affect. Behavior is normal.   ASSESSMENT AND PLAN:   #66. 73 year old female with long-standing GERD. Symptomatic with pyrosis /regurgitation over the last several months despite daily PPI and H2 blocker. She regurgitates into her nasal cavity. Patient has a known hiatal hernia. For further evaluation of hiatal hernia patient will be scheduled for barium swallow. She may need eventual referral to surgery for consideration of hiatal hernia repair. Will also schedule for an upper endoscopy to rule out any other etiologies of symptoms. Patient is on Coumadin, we will contact cardiology about holding the medication for procedures. The benefits, risks, and potential complications of EGD with possible biopsies were discussed with the patient and she agrees to proceed. Will change her Protonix to 30 minutes before breakfast, discontinue Zantac and try Pepcid Complete at bedtime.   #2. Family history colon cancer. One brother had cancer in his forties and another in his fifties. Hier last colonoscopy was 4.5 years ago with findings of only diverticulosis. She is due for surveillance colonoscopy in the next few months. Since Coumadin will be on hold and patient sedated it makes sense to proceed with the screening colonoscopy at the time of EGD to separate procedures. Additionally, should patient need surgery for hiatal hernia her surveillance colonoscopy will have already been done. The risks, benefits, and alternatives to colonoscopy with possible biopsy and possible polypectomy were discussed with the patient and she consents to proceed.  Procedures will be done by Dr. Carlean Purl who will assume patient's care given Dr. Buel Ream retirement  #3. Aortic valve replacement, chronic Coumadin. Will contact cardiology about holding Coumadin for endoscopic procedures.  #4. Multiple medical problems as listed in past medical history.

## 2014-07-30 ENCOUNTER — Encounter: Payer: Self-pay | Admitting: Nurse Practitioner

## 2014-07-30 DIAGNOSIS — Z800 Family history of malignant neoplasm of digestive organs: Secondary | ICD-10-CM | POA: Insufficient documentation

## 2014-07-30 DIAGNOSIS — K449 Diaphragmatic hernia without obstruction or gangrene: Secondary | ICD-10-CM | POA: Insufficient documentation

## 2014-08-01 ENCOUNTER — Encounter: Payer: Self-pay | Admitting: Internal Medicine

## 2014-08-02 ENCOUNTER — Ambulatory Visit (HOSPITAL_COMMUNITY)
Admission: RE | Admit: 2014-08-02 | Discharge: 2014-08-02 | Disposition: A | Discharge status: Home / Self Care | Payer: Medicare HMO | Source: Ambulatory Visit | Attending: Nurse Practitioner | Admitting: Nurse Practitioner

## 2014-08-02 DIAGNOSIS — K224 Dyskinesia of esophagus: Secondary | ICD-10-CM | POA: Diagnosis not present

## 2014-08-02 DIAGNOSIS — K449 Diaphragmatic hernia without obstruction or gangrene: Secondary | ICD-10-CM | POA: Diagnosis not present

## 2014-08-02 DIAGNOSIS — Z800 Family history of malignant neoplasm of digestive organs: Secondary | ICD-10-CM

## 2014-08-02 DIAGNOSIS — R12 Heartburn: Secondary | ICD-10-CM | POA: Diagnosis present

## 2014-08-02 DIAGNOSIS — K219 Gastro-esophageal reflux disease without esophagitis: Secondary | ICD-10-CM

## 2014-08-02 NOTE — Progress Notes (Signed)
Quick Note:  Probably EGD and colon not til Oct 8  Explain that needs esophageal manometry to evaluate motility in anticipation of surgery  I think its ok to refer for consideration of fundoplication before I scope if she is ok with that ______

## 2014-08-03 NOTE — Progress Notes (Signed)
Agree with Ms. Guenther's assessment and plan. Will see what cardiology says but AVR low-risk lesion and should be able to simply hold warfarin Large hiatal hernia was seen on xray study so we will plan for esophageal manometry also in anticipation of hiatal hernia repair.   [name], MD, [facility]

## 2014-08-05 ENCOUNTER — Other Ambulatory Visit: Payer: Self-pay

## 2014-08-05 DIAGNOSIS — K449 Diaphragmatic hernia without obstruction or gangrene: Principal | ICD-10-CM

## 2014-08-05 DIAGNOSIS — K219 Gastro-esophageal reflux disease without esophagitis: Secondary | ICD-10-CM

## 2014-08-05 NOTE — Progress Notes (Signed)
Quick Note:  Dr. Alphonsa Overall  ______

## 2014-08-06 ENCOUNTER — Other Ambulatory Visit: Payer: Self-pay

## 2014-08-06 DIAGNOSIS — K449 Diaphragmatic hernia without obstruction or gangrene: Secondary | ICD-10-CM

## 2014-08-12 ENCOUNTER — Encounter (HOSPITAL_COMMUNITY)
Admission: RE | Disposition: A | Discharge status: Home / Self Care | Payer: Self-pay | Source: Ambulatory Visit | Attending: Internal Medicine

## 2014-08-12 ENCOUNTER — Ambulatory Visit (HOSPITAL_COMMUNITY)
Admission: RE | Admit: 2014-08-12 | Discharge: 2014-08-12 | Disposition: A | Discharge status: Home / Self Care | Payer: Medicare HMO | Source: Ambulatory Visit | Attending: Internal Medicine | Admitting: Internal Medicine

## 2014-08-12 DIAGNOSIS — K219 Gastro-esophageal reflux disease without esophagitis: Secondary | ICD-10-CM | POA: Insufficient documentation

## 2014-08-12 DIAGNOSIS — K449 Diaphragmatic hernia without obstruction or gangrene: Secondary | ICD-10-CM

## 2014-08-12 HISTORY — PX: ESOPHAGEAL MANOMETRY: SHX5429

## 2014-08-12 SURGERY — MANOMETRY, ESOPHAGUS
Anesthesia: LOCAL

## 2014-08-12 MED ORDER — LIDOCAINE VISCOUS 2 % MT SOLN
OROMUCOSAL | Status: AC
  Filled 2014-08-12: qty 15

## 2014-08-12 SURGICAL SUPPLY — 2 items
FACESHIELD LNG OPTICON STERILE (SAFETY) IMPLANT
GLOVE BIO SURGEON STRL SZ8 (GLOVE) ×4 IMPLANT

## 2014-08-13 ENCOUNTER — Encounter (HOSPITAL_COMMUNITY): Payer: Self-pay | Admitting: Internal Medicine

## 2014-08-15 NOTE — Op Note (Signed)
Patient: [name], [name]   JZ:[id number] Gender: Female Physician: Dr. Silvano Rusk   DOB / Age: [birth year] Operator: Carmie End, RN, BSN   Height: 4 ft 11 in Referring Physician:    Procedure: EMZ Examination Date: 08/12/2014    Swallow Composite (mean of 10 swallows) Resting Pressure Profile & Anatomy     Basal Pressures* LES, respiratory mean(mmHg) 6.9 (13-43) UES mean(mmHg) 14.1 (34-104)  Anatomy* LES proximal(cm) 33.3 LES intraabdominal(cm) 0.0 Esophageal length(cm) 16.3 Hiatal hernia Yes, 5.5    Motility* Distal contr. integral(mmHg-cm-s) 759.8 ([phone]) Distal contr. int. (highest)(mmHg-cm-s) 1883.5 Incomplete bolus clearance(%) 90  Residual Pressures* LES (mean)(mmHg) 0.4 (<15.0) UES (mean)(mmHg) 12.7 (<12.0)  *Notes. Motility values are mean among swallows; Normal values in (xxx.x):  Simultaneous contractions: Velocity > 8.0 cm/s; eSlv: eSleeve; 3SN, IRP, DCI, IBP - See manual definitions  Lower Esophageal Sphincter Region  Normal Esophageal Motility  Normal  Landmarks   Number of swallows evaluated 10       Proximal LES (from nares)(cm) 33.3  High Resolution Parameters        LES length(cm) 3.5 2.7-4.8     Distal contractile integral(mean)(mmHg-cm-s) 759.8 [phone]      Esophageal length (LES-UES centers)(cm) 16.3      Distal contractile integral(highest)(mmHg-cm-s) 1883.5       Intraabdominal LES length(cm) 0.0      Contractile front velocity(cm/s) 2.2 <9.0      Hiatal hernia? Yes, 5.5  [place] Classification    LES Pressures       Distal latency 5.1       Pressure meas. method eSleeve,IRP      % failed ([place] Classification) 0       Basal (respiratory min.)(mmHg) -1.1 4.8-32.0     % panesophageal pressurization 0       Basal (respiratory mean)(mmHg) 6.9 13-43     % premature contraction 10       Residual (mean)(mmHg) 0.4 <15.0     % rapid contraction 0          % large breaks 0          % small breaks 30      Impedance analysis           Incomplete bolus  clearance(%) 90          Upper Esophageal Sphincter  Normal Pharyngeal / UES Motility  Normal  Mean basal pressure(mmHg) 14.1 34-104 No. swallows evaluated 10   Mean residual pressure(mmHg) 12.7 <12.0 Evaluated @ 3.0 & N/A above UES           Mean peak pressure(mmHg) 10.8       [place] Classification Findings*  EGJ: Normal Relaxation         Mean IRP (0.4 mmHg) is less than 15 mmHg Esophageal body: Small peristalsis breaks          % small break (30%) is greater than 30% Finding: Weak Peristalsis With Small Defects * Findings are based on published [place] Classification scheme and are only intended to serve as a guide for patient diagnosis   Procedure  After confirmation of potential allergies, a topical analgesic was used to numb the nares followed by trans-nasal insertion of a High Resolution Manometry Catheter. Pressure bands of both UES and LES were observed on the color contour. Patient instructed to take deep breath to verify placement of catheter; diaphragmatic pinch noted on inspiration. Patient was assisted to supine position and catheter was stabilized. Patient encouraged to relax while acclimating to catheter for approximately  5 minutes. A 30 second baseline pressure was obtained to identify the UES and LES followed by a series of wet swallows using 5 ccs room temperature water to assess esophageal motility. At the conclusion of the procedure; the catheter was removed.   Indications  GERD   Interpretation / Findings   Minimal nonspecific esophageal dysmotility Hiatal Hernia  E signed 08/15/2014 1655 HRS [name], MD

## 2014-09-11 ENCOUNTER — Encounter: Payer: Self-pay | Admitting: Gastroenterology

## 2014-09-25 ENCOUNTER — Telehealth: Payer: Self-pay | Admitting: *Deleted

## 2014-09-25 NOTE — Telephone Encounter (Signed)
I called the patient and went over the Coumadin clearance instructions prior to the procedure scheduled for 10-03-2014. Stop the Coumadin on 09-28-2014. Start the Enoxaparin 70 mg every 12 hours on 09-29-2014. No enoxaparin morning of 10-03-2014. Evening of 10-8/15 start the Enoxaparin and warfarin. Overlap for at least 5 days. INR due in : weeks of 10-07-2014. Patient verbalized understanding of the instructions. [facility] coumadin clinic staff gave her a copy of these instructions.

## 2014-09-26 ENCOUNTER — Ambulatory Visit (INDEPENDENT_AMBULATORY_CARE_PROVIDER_SITE_OTHER): Payer: Commercial Managed Care - HMO | Admitting: Surgery

## 2014-10-03 ENCOUNTER — Encounter (HOSPITAL_COMMUNITY): Payer: Self-pay

## 2014-10-03 ENCOUNTER — Encounter (HOSPITAL_COMMUNITY)
Admission: EM | Disposition: A | Discharge status: Home / Self Care | Payer: Self-pay | Source: Home / Self Care | Attending: Emergency Medicine

## 2014-10-03 ENCOUNTER — Encounter (HOSPITAL_COMMUNITY): Payer: Self-pay | Admitting: Emergency Medicine

## 2014-10-03 ENCOUNTER — Ambulatory Visit (HOSPITAL_COMMUNITY): Admit: 2014-10-03 | Payer: Self-pay | Admitting: Internal Medicine

## 2014-10-03 ENCOUNTER — Ambulatory Visit (AMBULATORY_SURGERY_CENTER): Payer: Self-pay | Admitting: Internal Medicine

## 2014-10-03 ENCOUNTER — Emergency Department (HOSPITAL_COMMUNITY)
Admission: EM | Admit: 2014-10-03 | Discharge: 2014-10-03 | Disposition: A | Discharge status: Home / Self Care | Payer: Medicare HMO | Attending: Emergency Medicine | Admitting: Emergency Medicine

## 2014-10-03 ENCOUNTER — Telehealth: Payer: Self-pay | Admitting: Internal Medicine

## 2014-10-03 ENCOUNTER — Emergency Department (HOSPITAL_COMMUNITY): Payer: Medicare HMO

## 2014-10-03 DIAGNOSIS — K219 Gastro-esophageal reflux disease without esophagitis: Secondary | ICD-10-CM | POA: Insufficient documentation

## 2014-10-03 DIAGNOSIS — Z7901 Long term (current) use of anticoagulants: Secondary | ICD-10-CM | POA: Insufficient documentation

## 2014-10-03 DIAGNOSIS — I10 Essential (primary) hypertension: Secondary | ICD-10-CM | POA: Diagnosis not present

## 2014-10-03 DIAGNOSIS — K5790 Diverticulosis of intestine, part unspecified, without perforation or abscess without bleeding: Secondary | ICD-10-CM | POA: Insufficient documentation

## 2014-10-03 DIAGNOSIS — I4891 Unspecified atrial fibrillation: Secondary | ICD-10-CM | POA: Diagnosis not present

## 2014-10-03 DIAGNOSIS — E780 Pure hypercholesterolemia: Secondary | ICD-10-CM | POA: Diagnosis not present

## 2014-10-03 DIAGNOSIS — R079 Chest pain, unspecified: Secondary | ICD-10-CM | POA: Diagnosis not present

## 2014-10-03 DIAGNOSIS — E669 Obesity, unspecified: Secondary | ICD-10-CM | POA: Insufficient documentation

## 2014-10-03 DIAGNOSIS — I350 Nonrheumatic aortic (valve) stenosis: Secondary | ICD-10-CM | POA: Insufficient documentation

## 2014-10-03 DIAGNOSIS — Z853 Personal history of malignant neoplasm of breast: Secondary | ICD-10-CM | POA: Diagnosis not present

## 2014-10-03 DIAGNOSIS — Z1211 Encounter for screening for malignant neoplasm of colon: Secondary | ICD-10-CM | POA: Diagnosis not present

## 2014-10-03 DIAGNOSIS — Z885 Allergy status to narcotic agent status: Secondary | ICD-10-CM | POA: Diagnosis not present

## 2014-10-03 DIAGNOSIS — R0789 Other chest pain: Secondary | ICD-10-CM

## 2014-10-03 DIAGNOSIS — Z79899 Other long term (current) drug therapy: Secondary | ICD-10-CM | POA: Diagnosis not present

## 2014-10-03 DIAGNOSIS — K449 Diaphragmatic hernia without obstruction or gangrene: Secondary | ICD-10-CM | POA: Insufficient documentation

## 2014-10-03 DIAGNOSIS — Z91041 Radiographic dye allergy status: Secondary | ICD-10-CM | POA: Insufficient documentation

## 2014-10-03 DIAGNOSIS — I471 Supraventricular tachycardia: Secondary | ICD-10-CM | POA: Diagnosis present

## 2014-10-03 DIAGNOSIS — K589 Irritable bowel syndrome without diarrhea: Secondary | ICD-10-CM | POA: Diagnosis not present

## 2014-10-03 DIAGNOSIS — Z952 Presence of prosthetic heart valve: Secondary | ICD-10-CM | POA: Diagnosis not present

## 2014-10-03 DIAGNOSIS — Z87442 Personal history of urinary calculi: Secondary | ICD-10-CM | POA: Diagnosis not present

## 2014-10-03 DIAGNOSIS — Z800 Family history of malignant neoplasm of digestive organs: Secondary | ICD-10-CM

## 2014-10-03 HISTORY — PX: ESOPHAGOGASTRODUODENOSCOPY: SHX5428

## 2014-10-03 HISTORY — PX: COLONOSCOPY: SHX5424

## 2014-10-03 HISTORY — DX: Nausea with vomiting, unspecified: R11.2

## 2014-10-03 HISTORY — DX: Other specified postprocedural states: Z98.890

## 2014-10-03 LAB — CBC WITH DIFFERENTIAL/PLATELET
Basophils Absolute: 0 10*3/uL (ref 0.0–0.1)
Basophils Relative: 1 % (ref 0–1)
Eosinophils Absolute: 0.1 10*3/uL (ref 0.0–0.7)
Eosinophils Relative: 2 % (ref 0–5)
HCT: 34.8 % — ABNORMAL LOW (ref 36.0–46.0)
Hemoglobin: 12 g/dL (ref 12.0–15.0)
Lymphocytes Relative: 33 % (ref 12–46)
Lymphs Abs: 1.3 10*3/uL (ref 0.7–4.0)
MCH: 34.6 pg — ABNORMAL HIGH (ref 26.0–34.0)
MCHC: 34.5 g/dL (ref 30.0–36.0)
MCV: 100.3 fL — ABNORMAL HIGH (ref 78.0–100.0)
Monocytes Absolute: 0.4 10*3/uL (ref 0.1–1.0)
Monocytes Relative: 11 % (ref 3–12)
Neutro Abs: 2 10*3/uL (ref 1.7–7.7)
Neutrophils Relative %: 53 % (ref 43–77)
Platelets: 165 10*3/uL (ref 150–400)
RBC: 3.47 MIL/uL — ABNORMAL LOW (ref 3.87–5.11)
RDW: 12.8 % (ref 11.5–15.5)
WBC: 3.8 10*3/uL — ABNORMAL LOW (ref 4.0–10.5)

## 2014-10-03 LAB — BASIC METABOLIC PANEL
Anion gap: 13 (ref 5–15)
BUN: 21 mg/dL (ref 6–23)
CO2: 24 mEq/L (ref 19–32)
Calcium: 10.6 mg/dL — ABNORMAL HIGH (ref 8.4–10.5)
Chloride: 106 mEq/L (ref 96–112)
Creatinine, Ser: 1.33 mg/dL — ABNORMAL HIGH (ref 0.50–1.10)
GFR calc Af Amer: 44 mL/min — ABNORMAL LOW (ref >90)
GFR calc non Af Amer: 38 mL/min — ABNORMAL LOW (ref >90)
Glucose, Bld: 87 mg/dL (ref 70–99)
Potassium: 4.2 mEq/L (ref 3.7–5.3)
Sodium: 143 mEq/L (ref 137–147)

## 2014-10-03 LAB — PROTIME-INR
INR: 1.12 (ref 0.00–1.49)
Prothrombin Time: 14.4 seconds (ref 11.6–15.2)

## 2014-10-03 SURGERY — EGD (ESOPHAGOGASTRODUODENOSCOPY)
Anesthesia: Moderate Sedation

## 2014-10-03 SURGERY — COLONOSCOPY
Anesthesia: Moderate Sedation

## 2014-10-03 MED ORDER — SODIUM CHLORIDE 0.9 % IV SOLN: INTRAVENOUS | Status: DC

## 2014-10-03 MED ORDER — SODIUM CHLORIDE 0.9 % IV SOLN
Freq: Once | INTRAVENOUS | Status: AC
  Administered 2014-10-03: 14:00:00 via INTRAVENOUS

## 2014-10-03 MED ORDER — FENTANYL CITRATE 0.05 MG/ML IJ SOLN
INTRAMUSCULAR | Status: DC | PRN
  Administered 2014-10-03 (×3): 25 ug via INTRAVENOUS

## 2014-10-03 MED ORDER — MIDAZOLAM HCL 5 MG/ML IJ SOLN
INTRAMUSCULAR | Status: AC
  Filled 2014-10-03: qty 2

## 2014-10-03 MED ORDER — METOPROLOL TARTRATE 50 MG PO TABS: 25.0000 mg | ORAL_TABLET | ORAL | Status: DC | PRN

## 2014-10-03 MED ORDER — MIDAZOLAM HCL 10 MG/2ML IJ SOLN
INTRAMUSCULAR | Status: DC | PRN
  Administered 2014-10-03: 2 mg via INTRAVENOUS
  Administered 2014-10-03: 1 mg via INTRAVENOUS
  Administered 2014-10-03: 2 mg via INTRAVENOUS
  Administered 2014-10-03: 1 mg via INTRAVENOUS

## 2014-10-03 MED ORDER — FENTANYL CITRATE 0.05 MG/ML IJ SOLN
INTRAMUSCULAR | Status: AC
  Filled 2014-10-03: qty 2

## 2014-10-03 NOTE — ED Notes (Signed)
Pt verbalized understanding of medication and discharge paperwork. Pat, RN in endoscopy to discharge pt.

## 2014-10-03 NOTE — ED Provider Notes (Signed)
CSN: [id number]     Arrival date & time 10/03/14  1007 History   First MD Initiated Contact with Patient 10/03/14 1012     Chief Complaint  Patient presents with  . Atrial Fibrillation   Patient is a 73 y.o. female presenting with palpitations. The history is provided by the patient.  Palpitations Palpitations quality:  Fast Onset quality:  At rest Timing:  Intermittent Progression:  Waxing and waning Chronicity:  Recurrent Context comment:  In preparation for colonoscopy today Relieved by: resolves spontaneously. Associated symptoms: no back pain, no chest pain, no dizziness, no nausea, no shortness of breath, no syncope, no vomiting and no weakness   Risk factors: heart disease and hx of atrial fibrillation     Patient is a 73 year old Caucasian female with a history of A. fib who presents with tachycardia. Patient was scheduled for colonoscopy today. However the GI doctors noted a rapid HR on the monitor and had the patient come to the ED to be evaluated. Patient denies chest pain, shortness of breath, dizziness, lightheadedness. Patient has been off of her Coumadin in preparation for colonoscopy and has been getting Lovenox shots daily. She denies blood per rectum.  Past Medical History  Diagnosis Date  . Aortic stenosis, severe   . HTN (hypertension)   . Hypercholesteremia   . PVC's (premature ventricular contractions)   . A-fib   . Venous varices    . GERD (gastroesophageal reflux disease)   . Breast CA 2004  . Chest pain   . Herpes zoster infection     History of herpes zoster infection  . Hypercholesterolemia   . Obesity   . History of kidney stones   . History of bronchitis   . Mucositis     Severe mucositis  . Irritable bowel syndrome   . History of hiatal hernia    Past Surgical History  Procedure Laterality Date  . Aortic valve replacement  04/13/06    St. Jude mechanical conduit  . Total abdominal hysterectomy    . Cystocele repair    . Rectocele repair     . Appendectomy    . Kidney stone surgery    . Foot surgery    . Cataract surgery      bilateral  . Esophageal manometry N/A 08/12/2014    Procedure: ESOPHAGEAL MANOMETRY (EM);  Surgeon: [name], MD;  Location: [facility] ENDOSCOPY;  Service: Endoscopy;  Laterality: N/A;   Family History  Problem Relation Age of Onset  . Heart disease Mother   . Heart disease Father 31    CABG  . Other Brother     AVR   History  Substance Use Topics  . Smoking status: Never Smoker   . Smokeless tobacco: Never Used  . Alcohol Use: No   OB History   Grav Para Term Preterm Abortions TAB SAB Ect Mult Living                 Review of Systems  Constitutional: Negative for fever.  HENT: Negative for rhinorrhea and sore throat.   Eyes: Negative for visual disturbance.  Respiratory: Negative for chest tightness and shortness of breath.   Cardiovascular: Positive for palpitations. Negative for chest pain and syncope.  Gastrointestinal: Negative for nausea, vomiting, abdominal pain and constipation.  Genitourinary: Negative for dysuria and hematuria.  Musculoskeletal: Negative for back pain and neck pain.  Skin: Negative for rash.  Neurological: Negative for dizziness and headaches.  Psychiatric/Behavioral: Negative for confusion.  All other  systems reviewed and are negative.     Allergies  Demerol; Meperidine hcl; and Ivp dye  Home Medications   Prior to Admission medications   Medication Sig Start Date End Date Taking? Authorizing Provider  Ascorbic Acid (VITAMIN C) 1000 MG tablet Take 1,000 mg by mouth 3 (three) times daily.     Yes Historical Provider, MD  calcium citrate-vitamin D (CITRACAL+D) 315-200 MG-UNIT per tablet Take 1 tablet by mouth 3 (three) times daily.   Yes Historical Provider, MD  Cholecalciferol (VITAMIN D3) 1000 UNITS tablet Take 2,000 Units by mouth daily.    Yes Historical Provider, MD  enoxaparin (LOVENOX) 80 MG/0.8ML injection Inject 70 mg into the skin daily.   09/24/14  Yes Historical Provider, MD  famotidine-calcium carbonate-magnesium hydroxide (PEPCID COMPLETE) 10-800-165 MG CHEW chewable tablet Chew 1 tablet by mouth daily as needed (heartburn).   Yes Historical Provider, MD  hydrochlorothiazide (HYDRODIURIL) 25 MG tablet Take 25 mg by mouth every morning. 01/23/13 10/03/14 Yes Peter M Martinique, MD  losartan (COZAAR) 100 MG tablet Take 100 mg by mouth every morning.    Yes Historical Provider, MD  MOVIPREP 100 G SOLR Take 1 kit (200 g total) by mouth as directed. 07/29/14  Yes Willia Craze, NP  Multiple Vitamin (MULTIVITAMIN) tablet Take 1 tablet by mouth daily at 12 noon.    Yes Historical Provider, MD  Multiple Vitamins-Minerals (VISION-VITE PRESERVE PO) Take 1 tablet by mouth 2 (two) times daily.   Yes Historical Provider, MD  oxybutynin (DITROPAN) 5 MG tablet Take 5 mg by mouth daily.   Yes Historical Provider, MD  pantoprazole (PROTONIX) 40 MG tablet Take 40 mg by mouth daily.    Yes Historical Provider, MD  simvastatin (ZOCOR) 20 MG tablet Take 20 mg by mouth every evening.   Yes Historical Provider, MD  vitamin B-12 (CYANOCOBALAMIN) 1000 MCG tablet Take 1,000 mcg by mouth every morning.   Yes Historical Provider, MD  warfarin (COUMADIN) 5 MG tablet Take 5 mg by mouth every evening.   Yes Historical Provider, MD  metoprolol (LOPRESSOR) 50 MG tablet Take 0.5 tablets (25 mg total) by mouth as needed (if your heart rate is fast and you feel lightheaded, dizzy, or chest pain). 10/03/14   Gustavus Bryant, MD   BP 109/72  Temp(Src) 98.9 F (37.2 C) (Oral)  Resp 12  SpO2 98% Physical Exam  Constitutional: She is oriented to person, place, and time. She appears well-developed and well-nourished. No distress.  HENT:  Head: Normocephalic and atraumatic.  Mouth/Throat: Oropharynx is clear and moist.  Eyes: EOM are normal. Pupils are equal, round, and reactive to light.  Neck: Neck supple. No JVD present.  Cardiovascular: Normal rate, regular rhythm,  normal heart sounds and intact distal pulses.  Exam reveals no gallop.   No murmur heard. Pulmonary/Chest: Effort normal and breath sounds normal. She has no wheezes. She has no rales.  Abdominal: Soft. She exhibits no distension. There is no tenderness.  Musculoskeletal: Normal range of motion. She exhibits no tenderness.  Neurological: She is alert and oriented to person, place, and time. No cranial nerve deficit. She exhibits normal muscle tone.  Skin: Skin is warm and dry. No rash noted.  Psychiatric: Her behavior is normal.    ED Course  Procedures  None Labs Review Labs Reviewed  CBC WITH DIFFERENTIAL - Abnormal; Notable for the following:    WBC 3.8 (*)    RBC 3.47 (*)    HCT 34.8 (*)    MCV  100.3 (*)    MCH 34.6 (*)    All other components within normal limits  BASIC METABOLIC PANEL - Abnormal; Notable for the following:    Creatinine, Ser 1.33 (*)    Calcium 10.6 (*)    GFR calc non Af Amer 38 (*)    GFR calc Af Amer 44 (*)    All other components within normal limits  PROTIME-INR    Imaging Review Dg Chest 2 View  10/03/2014   CLINICAL DATA:  Dizziness and atrial fibrillation  EXAM: CHEST  2 VIEW  COMPARISON:  September 29, 2011  FINDINGS: There is scarring in the left base, stable. There is no edema or consolidation. Heart size and pulmonary vascularity are normal. Patient is status post aortic valve replacement. There is a small hiatal hernia. There is degenerative change in the thoracic spine.  IMPRESSION: Status post aortic valve replacement. Mild scarring left base. No edema or consolidation.   Electronically Signed   By: Lowella Grip M.D.   On: 10/03/2014 10:51     EKG Interpretation   Date/Time:  Thursday October 03 2014 10:13:14 EDT Ventricular Rate:  153 PR Interval:    QRS Duration: 125 QT Interval:  313 QTC Calculation: 499 R Axis:   175 Text Interpretation:  Wide-QRS tachycardia RBBB and LPFB SINCE LAST  TRACING HEART RATE HAS INCREASED Confirmed  by Debby Freiberg [id number]) on  10/03/2014 10:25:39 AM      MDM   Final diagnoses:  SVT (supraventricular tachycardia)    73 year old female with A. fib on Coumadin who is temporarily on Lovenox presents from the endoscopy suite for tachycardia. Patient is asymptomatic. EKG demonstrates wide complex tachycardia with a known bundle branch block. Her exam is unremarkable. Patient's rate is 80s the monitor and appears to be irregular rhythm. Will repeat EKG and obtain CBC BMP and INR. Labs are unremarkable. Hemoglobin is 12. Creatinine 1.3. Chest x-ray is unremarkable for acute process. Repeat EKG was performed and demonstrates normal sinus rhythm, rate 82, right bundle branch block, no ST elevation or depression. This is more suspicious for prior episode of transient SVT rather than A. fib with RVR. However will consult her cardiologist for recommendations regarding rate control and if she is cleared for colonoscopy later today. Spoke with cardiology who recommended patient proceed with a colonoscopy today. They also recommended starting Lopressor 25 mg as needed if her symptoms persist. Will contact GI and get their recommendations regarding the patient's colonoscopy. She remains asymptomatic. GI plans to perform colonoscopy and EGD at 4 PM. I spoke with the endoscopy suite and they will have the patient transferred upstairs for her procedure. Patient was stable for transfer to the endoscopy suite.  Case discussed with Dr. Colin Rhein.   Gustavus Bryant, MD 10/03/14 [phone]

## 2014-10-03 NOTE — H&P (Signed)
[facility] History and Physical   Primary Care Physician:  Donnajean Lopes, MD   Reason for Procedure:   chest pain, hiatal hernia contemplating repair, screening colonoscopy  Plan:    egd and colonoscopy     The risks and benefits as well as alternatives of endoscopic procedure(s) have been discussed and reviewed. All questions answered. The patient agrees to proceed.   HPI: [name] is a 73 y.o. female with above problems. She had rapid heart rate, ? Afib RVR vs SVT. SVT was favored. She was at [facility] and sent to ED. SVT resolved and she was oked for procedures today.   Past Medical History  Diagnosis Date  . Aortic stenosis, severe   . HTN (hypertension)   . Hypercholesteremia   . PVC's (premature ventricular contractions)   . A-fib   . Venous varices    . GERD (gastroesophageal reflux disease)   . Breast CA 2004  . Chest pain   . Herpes zoster infection     History of herpes zoster infection  . Hypercholesterolemia   . Obesity   . History of kidney stones   . History of bronchitis   . Mucositis     Severe mucositis  . Irritable bowel syndrome   . History of hiatal hernia   . PONV (postoperative nausea and vomiting)     Past Surgical History  Procedure Laterality Date  . Aortic valve replacement  04/13/06    St. Jude mechanical conduit  . Total abdominal hysterectomy    . Cystocele repair    . Rectocele repair    . Appendectomy    . Kidney stone surgery    . Foot surgery    . Cataract surgery      bilateral  . Esophageal manometry N/A 08/12/2014    Procedure: ESOPHAGEAL MANOMETRY (EM);  Surgeon: [name], MD;  Location: [facility];  Service: Endoscopy;  Laterality: N/A;    Prior to Admission medications   Medication Sig Start Date End Date Taking? Authorizing Provider  Ascorbic Acid (VITAMIN C) 1000 MG tablet Take 1,000 mg by mouth 3 (three) times daily.     Yes Historical Provider, MD  calcium  citrate-vitamin D (CITRACAL+D) 315-200 MG-UNIT per tablet Take 1 tablet by mouth 3 (three) times daily.   Yes Historical Provider, MD  Cholecalciferol (VITAMIN D3) 1000 UNITS tablet Take 2,000 Units by mouth daily.    Yes Historical Provider, MD  enoxaparin (LOVENOX) 80 MG/0.8ML injection Inject 70 mg into the skin daily.  09/24/14  Yes Historical Provider, MD  famotidine-calcium carbonate-magnesium hydroxide (PEPCID COMPLETE) 10-800-165 MG CHEW chewable tablet Chew 1 tablet by mouth daily as needed (heartburn).   Yes Historical Provider, MD  hydrochlorothiazide (HYDRODIURIL) 25 MG tablet Take 25 mg by mouth every morning. 01/23/13 10/03/14 Yes Peter M Martinique, MD  losartan (COZAAR) 100 MG tablet Take 100 mg by mouth every morning.    Yes Historical Provider, MD  MOVIPREP 100 G SOLR Take 1 kit (200 g total) by mouth as directed. 07/29/14  Yes Willia Craze, NP  Multiple Vitamin (MULTIVITAMIN) tablet Take 1 tablet by mouth daily at 12 noon.    Yes Historical Provider, MD  Multiple Vitamins-Minerals (VISION-VITE PRESERVE PO) Take 1 tablet by mouth 2 (two) times daily.   Yes Historical Provider, MD  oxybutynin (DITROPAN) 5 MG tablet Take 5 mg by mouth daily.   Yes Historical Provider, MD  pantoprazole (PROTONIX) 40 MG tablet Take 40 mg by mouth  daily.    Yes Historical Provider, MD  simvastatin (ZOCOR) 20 MG tablet Take 20 mg by mouth every evening.   Yes Historical Provider, MD  vitamin B-12 (CYANOCOBALAMIN) 1000 MCG tablet Take 1,000 mcg by mouth every morning.   Yes Historical Provider, MD  warfarin (COUMADIN) 5 MG tablet Take 5 mg by mouth every evening.   Yes Historical Provider, MD  metoprolol (LOPRESSOR) 50 MG tablet Take 0.5 tablets (25 mg total) by mouth as needed (if your heart rate is fast and you feel lightheaded, dizzy, or chest pain). 10/03/14   Gustavus Bryant, MD    Current Facility-Administered Medications  Medication Dose Route Frequency Provider Last Rate Last Dose  . 0.9 %  sodium  chloride infusion   Intravenous Continuous [name], MD        Allergies as of 10/03/2014 - Review Complete 10/03/2014  Allergen Reaction Noted  . Demerol Shortness Of Breath and Swelling 10/13/2011  . Meperidine hcl Shortness Of Breath and Swelling   . Ivp dye [iodinated diagnostic agents] Hives 10/13/2011    Family History  Problem Relation Age of Onset  . Heart disease Mother   . Heart disease Father 59    CABG  . Other Brother     AVR    History   Social History  . Marital Status: Married    Spouse Name: N/A    Number of Children: 3  . Years of Education: N/A   Occupational History  . bakery/caterer Other    retired   Social History Main Topics  . Smoking status: Never Smoker   . Smokeless tobacco: Never Used  . Alcohol Use: No  . Drug Use: No     Review of Systems: Positive for tachycardia today All other review of systems negative except as mentioned in the HPI.  Physical Exam: Vital signs in last 24 hours: Temp:  [98.9 F (37.2 C)] 98.9 F (37.2 C) (10/08 1015) Pulse Rate:  [69-86] 76 (10/08 1613) Resp:  [12-20] 16 (10/08 1613) BP: (94-119)/(46-77) 107/48 mmHg (10/08 1543) SpO2:  [95 %-100 %] 100 % (10/08 1613) Weight:  [149 lb (67.586 kg)] 149 lb (67.586 kg) (10/08 1613)   General:   Alert,  Well-developed, well-nourished, pleasant and cooperative in NAD Lungs:  Clear throughout to auscultation.   Heart:  Regular rate no murmurs, + metallic s2 . Abdomen:  Soft, nontender and nondistended. Normal bowel sounds.   Neuro/Psych:  Alert and cooperative. Normal mood and affect. A and O x 3   @Sai Zinn  Simonne Maffucci, MD, [facility] [phone] (pager) 10/03/2014 4:18 PM@

## 2014-10-03 NOTE — Discharge Instructions (Addendum)
I found a hiatal hernia without other problems on the upper endoscopy. Colonoscopy showed diverticulosis but no polyps or cancer.  Next step is to follow-up with Dr. Lucia Gaskins as planned.  I appreciate the opportunity to care for you. [name], MD, FACG   YOU HAD AN ENDOSCOPIC PROCEDURE TODAY: Refer to the procedure report and other information in the discharge instructions given to you for any specific questions about what was found during the examination. If this information does not answer your questions, please call Dr. Celesta Aver office at [phone] to clarify.   YOU SHOULD EXPECT: Some feelings of bloating in the abdomen. Passage of more gas than usual. Walking can help get rid of the air that was put into your GI tract during the procedure and reduce the bloating. If you had a lower endoscopy (such as a colonoscopy or flexible sigmoidoscopy) you may notice spotting of blood in your stool or on the toilet paper. Some abdominal soreness may be present for a day or two, also.  DIET: Your first meal following the procedure should be a light meal and then it is ok to progress to your normal diet. A half-sandwich or bowl of soup is an example of a good first meal. Heavy or fried foods are harder to digest and may make you feel nauseous or bloated. Drink plenty of fluids but you should avoid alcoholic beverages for 24 hours.   ACTIVITY: Your care partner should take you home directly after the procedure. You should plan to take it easy, moving slowly for the rest of the day. You can resume normal activity the day after the procedure however YOU SHOULD NOT DRIVE, use power tools, machinery or perform tasks that involve climbing or major physical exertion for 24 hours (because of the sedation medicines used during the test).   SYMPTOMS TO REPORT IMMEDIATELY: A gastroenterologist can be reached at any hour. Please call [phone]  for any of the following symptoms:  Following lower endoscopy  (colonoscopy, flexible sigmoidoscopy) Excessive amounts of blood in the stool  Significant tenderness, worsening of abdominal pains  Swelling of the abdomen that is new, acute  Fever of 100 or higher  Following upper endoscopy (EGD, EUS, ERCP, esophageal dilation) Vomiting of blood or coffee ground material  New, significant abdominal pain  New, significant chest pain or pain under the shoulder blades  Painful or persistently difficult swallowing  New shortness of breath  Black, tarry-looking or red, bloody stools  FOLLOW UP:  If any biopsies were taken you will be contacted by phone or by letter within the next 1-3 weeks. Call [phone]  if you have not heard about the biopsies in 3 weeks.  Please also call with any specific questions about appointments or follow up tests.

## 2014-10-03 NOTE — Progress Notes (Signed)
Aware of cancelled colonoscopy/EGD due to rapid afib.   Note BP 90s/50s at present.   Once rate is controlled we can consider colon/EGD if Cardiology clears her to pursue this.  She had been holding Coumadin to allow for studies.    As of 1315 there are no orders.  H & P not completed.  Not yet seen by cardiology.    Supsect it may be a few days or longer before she will be ready or safe to pursue endoscopic studies. In meantime would restart solids within 24 hours if there will be delays in clearance for GI studies. Can have clears in the meantime.  We are available PRN.  Dr Fuller Plan is covering weekend call. Please call if she is ready to procedure.   Azucena Freed PA-C Pager [phone]

## 2014-10-03 NOTE — Op Note (Signed)
[facility] [place], [id number]   COLONOSCOPY PROCEDURE REPORT  PATIENT: [name], [name]  MR#: [id number] BIRTHDATE: 29-Dec-1940 , 73  yrs. old GENDER: female ENDOSCOPIST: [name], MD, [facility] PROCEDURE DATE:  10/03/2014 PROCEDURE:   Colonoscopy, screening First Screening Colonoscopy - Avg.  risk and is 50 yrs.  old or older - No.  Prior Negative Screening - Now for repeat screening. Less than 10 yrs Prior Negative Screening - Now for repeat screening.  Above average risk  History of Adenoma - Now for follow-up colonoscopy & has been > or = to 3 yrs.  N/A ASA CLASS:   Class III INDICATIONS:patient's immediate family history of colon cancer. MEDICATIONS: Residual sedation present, Fentanyl 25 mcg IV, and Versed 1 mg IV  DESCRIPTION OF PROCEDURE:   After the risks benefits and alternatives of the procedure were thoroughly explained, informed consent was obtained.  The digital rectal exam revealed no abnormalities of the rectum.   The Pentax Adult Colon [id number] endoscope was introduced through the anus and advanced to the cecum, which was identified by both the appendix and ileocecal valve. No adverse events experienced.   The quality of the prep was good.  The instrument was then slowly withdrawn as the colon was fully examined.      COLON FINDINGS: There was severe diverticulosis noted in the sigmoid colon with associated luminal narrowing and angulation.   The examination was otherwise normal.  Retroflexed views revealed no abnormalities. The time to cecum=12 minutes 0 seconds.  Withdrawal time=6 minutes 0 seconds.  The scope was withdrawn and the procedure completed. COMPLICATIONS: There were no immediate complications.  ENDOSCOPIC IMPRESSION: 1.   There was severe diverticulosis noted in the sigmoid colon This necessitated changeing from adult to pediatric colonoscope. 2.   The examination was otherwise  normal  RECOMMENDATIONS: Routine repeat colonoscopy screening not necessary.  See me/GI as needed.  eSigned:  [name], MD, [facility] 10/03/2014 5:10 PM   cc: The Patient, Alphonsa Overall, MD

## 2014-10-03 NOTE — Progress Notes (Addendum)
On admission patient's heart rate consistently at 156. Patient denies shortness of breath or chest pain.patient on Lovenox bridge. Informed Dr. Carlean Purl of vital signs and patient's condition. 12 lead EKG done.

## 2014-10-03 NOTE — Progress Notes (Signed)
The Patient has Afib and is probably in Afib RVR vs A flutter 2:1 block (strip and 12 lead) She is asymptomatic Lungs clear Ht RVR S1S2 Vitals o/w ok  Will transfer to [facility] for evaluation and treatment. Will have GI PA see her also ? If it will be possible to complete colonoscopy and EGD w/u tomorrow if she stabilizes    [name], MD, [facility] [phone] (pager) 10/03/2014 9:35 AM

## 2014-10-03 NOTE — Progress Notes (Signed)
Called EMS at [place]. Arrival at [phone], report given. Transfer form, demographic sheet, and 12 lead copy to EMS.patient discharged at [facility] via EMS transport to [facility] ED. Patient still asymptomatic on discharge.118/71, HR- 160, resp 16.

## 2014-10-03 NOTE — Telephone Encounter (Signed)
no

## 2014-10-03 NOTE — ED Notes (Signed)
Pt here with c/o afib rate from 60 to 150 , pt has been doing a prep for her colonoscopy this morning , pt has been off her coumadin since Sunday but has been taking her lovenox shots

## 2014-10-03 NOTE — Op Note (Signed)
[facility] [place], [address]   ENDOSCOPY PROCEDURE REPORT  PATIENT: Christy, [name]  MR#: [id number] BIRTHDATE: [birth year] , 73  yrs. old GENDER: female ENDOSCOPIST: [name], MD, [facility] PROCEDURE DATE:  10/03/2014 PROCEDURE:  EGD, diagnostic ASA CLASS:     Class III INDICATIONS:  chest pain, hiatal hernia and pre-op. MEDICATIONS: Fentanyl 50 mcg IV and Versed 5 mg IV TOPICAL ANESTHETIC: Cetacaine Spray  DESCRIPTION OF PROCEDURE: After the risks benefits and alternatives of the procedure were thoroughly explained, informed consent was obtained.  The Pentax Gastroscope [id number] endoscope was introduced through the mouth and advanced to the second portion of the duodenum , Without limitations.  The instrument was slowly withdrawn as the mucosa was fully examined.    STOMACH: A cm hiatal hernia was noted. 10 cm hiatal hernia was noted. (30-40 cm) Otherwise normal EGD  Retroflexed views revealed a hiatal hernia    The scope was then withdrawn from the patient and the procedure completed.  COMPLICATIONS: There were no immediate complications.  ENDOSCOPIC IMPRESSION: 1.   10 cm hiatal hernia was noted. 2.   Otherwise normal EGD  RECOMMENDATIONS: Per Dr.  Lucia Gaskins  REPEAT EXAM:  eSigned:  [name], MD, [facility] 10/03/2014 4:38 PM    CC:The Patient and Alphonsa Overall, MD

## 2014-10-04 ENCOUNTER — Encounter (HOSPITAL_COMMUNITY): Payer: Self-pay | Admitting: Internal Medicine

## 2014-10-04 NOTE — ED Provider Notes (Signed)
I saw and evaluated the patient, reviewed the resident's note and I agree with the findings and plan.   EKG Interpretation   Date/Time:  Thursday October 03 2014 10:13:14 EDT Ventricular Rate:  153 PR Interval:    QRS Duration: 125 QT Interval:  313 QTC Calculation: 499 R Axis:   175 Text Interpretation:  Wide-QRS tachycardia RBBB and LPFB SINCE LAST  TRACING HEART RATE HAS INCREASED Confirmed by Debby Freiberg [phone]) on  10/03/2014 10:25:39 AM      Briefly, pt is a 73 y.o. female presenting with asymptomatic tachycardia from preop clearance for colonoscopy.  Pt has a ho afib.  I performed an examination on the patient including cardiac, pulmonary, and gi systems which were unremarkable.  HR improved spontaneously just after arrival and tachycardia did not improve.  Pt had pre-existing bundle-branch block, and wide QRS. Given abrupt onset, regular rate of wide complex tachycardia consider SVT likely rhythm. We spoke with cardiology he recommended a small amount of beta blocker when necessary for tachycardia.  We then spoke with gastroenterology who took the patient for colonoscopy.     Debby Freiberg, MD 10/04/14 [phone]

## 2014-10-15 ENCOUNTER — Encounter: Payer: Self-pay | Admitting: Physician Assistant

## 2014-10-15 ENCOUNTER — Ambulatory Visit (INDEPENDENT_AMBULATORY_CARE_PROVIDER_SITE_OTHER): Payer: Commercial Managed Care - HMO | Admitting: Physician Assistant

## 2014-10-15 VITALS — BP 133/78 | HR 67 | Ht 63.25 in | Wt 149.4 lb

## 2014-10-15 DIAGNOSIS — I4891 Unspecified atrial fibrillation: Secondary | ICD-10-CM

## 2014-10-15 DIAGNOSIS — Z954 Presence of other heart-valve replacement: Secondary | ICD-10-CM

## 2014-10-15 DIAGNOSIS — Z952 Presence of prosthetic heart valve: Secondary | ICD-10-CM

## 2014-10-15 DIAGNOSIS — Z7901 Long term (current) use of anticoagulants: Secondary | ICD-10-CM

## 2014-10-15 NOTE — Progress Notes (Signed)
Date:  10/15/2014   ID:  [name], DOB 02/28/1941, MRN [id number]  PCP:  Donnajean Lopes, MD  Primary Cardiologist:  Johnsie Cancel    History of Present Illness: [name] is a 73 y.o. female She has a history of aortic valve disease and is status post aortic valve replacement with mechanical St Jude's prosthesis in 2007. She is on chronic anticoagulation. She has remote history of atrial fibrillation without recurrence over the past 7 years. She has a history of hypertension.  She does have a history of several falls in the past and fractured a metatarsal in December. Her last Echo in October 2012 showed global hypokinesis with EF 45-50%. Normal AV prosthesis function with mild AI and MR.   Patient presents today post ER visit which was on Oct 8. She was going in for EGD and colonoscopy and was found to be in rapid atrial fibrillation. She was subsequently sent to the emergency room. Her heart rate was 153 EKG at 1013 hours and at 1108 hours she was in sinus rhythm with a rate of 82 beats per minute.  She was started on as needed Lopressor and subsequently underwent colonoscopy and EGD at 1600 hours. She was discharged that same day. She reports feeling fine since that day she's reports she did not feel the rapid heart rate. She does experience some dizziness now and then and typically it is when she stands up.   She currently denies nausea, vomiting, fever, chest pain, shortness of breath, orthopnea, dizziness, PND, cough, congestion, abdominal pain, hematochezia, melena, lower extremity edema, claudication.  Wt Readings from Last 3 Encounters:  10/15/14 149 lb 6.4 oz (67.767 kg)  10/03/14 149 lb (67.586 kg)  10/03/14 149 lb (67.586 kg)     Past Medical History  Diagnosis Date  . Aortic stenosis, severe   . HTN (hypertension)   . Hypercholesteremia   . PVC's (premature ventricular contractions)   . A-fib   . Venous varices    . GERD (gastroesophageal reflux disease)   .  Breast CA 2004  . Chest pain   . Herpes zoster infection     History of herpes zoster infection  . Hypercholesterolemia   . Obesity   . History of kidney stones   . History of bronchitis   . Mucositis     Severe mucositis  . Irritable bowel syndrome   . History of hiatal hernia   . PONV (postoperative nausea and vomiting)     Current Outpatient Prescriptions  Medication Sig Dispense Refill  . Ascorbic Acid (VITAMIN C) 1000 MG tablet Take 1,000 mg by mouth 3 (three) times daily.        . calcium citrate-vitamin D (CITRACAL+D) 315-200 MG-UNIT per tablet Take 1 tablet by mouth 3 (three) times daily.      . Cholecalciferol (VITAMIN D3) 1000 UNITS tablet Take 2,000 Units by mouth daily.       Marland Kitchen enoxaparin (LOVENOX) 80 MG/0.8ML injection Inject 70 mg into the skin daily.       . famotidine-calcium carbonate-magnesium hydroxide (PEPCID COMPLETE) 10-800-165 MG CHEW chewable tablet Chew 1 tablet by mouth daily as needed (heartburn).      . losartan (COZAAR) 100 MG tablet Take 100 mg by mouth every morning.       . Multiple Vitamin (MULTIVITAMIN) tablet Take 1 tablet by mouth daily at 12 noon.       . Multiple Vitamins-Minerals (VISION-VITE PRESERVE PO) Take 1 tablet by mouth 2 (two)  times daily.      Marland Kitchen oxybutynin (DITROPAN) 5 MG tablet Take 5 mg by mouth daily.      . pantoprazole (PROTONIX) 40 MG tablet Take 40 mg by mouth daily.       . simvastatin (ZOCOR) 20 MG tablet Take 20 mg by mouth every evening.      . vitamin B-12 (CYANOCOBALAMIN) 1000 MCG tablet Take 1,000 mcg by mouth every morning.      . warfarin (COUMADIN) 5 MG tablet Take 5 mg by mouth every evening.      . hydrochlorothiazide (HYDRODIURIL) 25 MG tablet Take 25 mg by mouth every morning.      . metoprolol (LOPRESSOR) 50 MG tablet Take 0.5 tablets (25 mg total) by mouth as needed (if your heart rate is fast and you feel lightheaded, dizzy, or chest pain).  20 tablet  0   No current facility-administered medications for this  visit.    Allergies:    Allergies  Allergen Reactions  . Demerol Shortness Of Breath and Swelling  . Meperidine Hcl Shortness Of Breath and Swelling  . Ivp Dye [Iodinated Diagnostic Agents] Hives    OK with benadryl    Social History:  The patient  reports that she has never smoked. She has never used smokeless tobacco. She reports that she does not drink alcohol or use illicit drugs.   Family history:   Family History  Problem Relation Age of Onset  . Heart disease Mother   . Heart disease Father 25    CABG  . Other Brother     AVR    ROS:  Please see the history of present illness.  All other systems reviewed and negative.   PHYSICAL EXAM: VS:  BP 133/78  Pulse 67  Ht 5' 3.25" (1.607 m)  Wt 149 lb 6.4 oz (67.767 kg)  BMI 26.24 kg/m2 Well nourished, well developed, in no acute distress HEENT: Pupils are equal round react to light accommodation extraocular movements are intact.  Neck: No cervical lymphadenopathy. Cardiac: Regular rate and rhythm with 1/6 sys mm.  Mech valve clicks Lungs:  clear to auscultation bilaterally, no wheezing, rhonchi or rales Ext: no lower extremity edema.  2+ radial and dorsalis pedis pulses. Skin: warm and dry Neuro:  Grossly normal  EKG:  Normal sinus rhythm rate 67 beats per minute left axis deviation left bundle branch block   ASSESSMENT AND PLAN:  Problem List Items Addressed This Visit   A-fib - Primary     The patient is feeling fine since the ER visit on Oct 8.  She is maintaining NSR with a rate of 67.  She did not notice her HR was 150's when she was in the ER.  She does have a new left axis deviation on her EKG.  Of course her INR was subtherapeutic for the EGD and colonoscopy but is back on coumadin now which is for the mechanical valve.    It could be the stress of the bowel prep triggered the Afib.  She has reported some intermittent dizziness with standing.  I will start by checking an echo.  She will continue with the  lopressor as needed.  Consider cardionet monitoring in the future.     Relevant Orders      EKG 12-Lead      2D Echocardiogram without contrast   Anticoagulant long-term use   S/P AVR (aortic valve replacement)

## 2014-10-15 NOTE — Patient Instructions (Signed)
Your physician has requested that you have an echocardiogram. Echocardiography is a painless test that uses sound waves to create images of your heart. It provides your doctor with information about the size and shape of your heart and how well your heart's chambers and valves are working. This procedure takes approximately one hour. There are no restrictions for this procedure.   Your physician recommends that you schedule a follow-up appointment in: 3 months with Dr. Martinique.

## 2014-10-15 NOTE — Assessment & Plan Note (Signed)
The patient is feeling fine since the ER visit on Oct 8.  She is maintaining NSR with a rate of 67.  She did not notice her HR was 150's when she was in the ER.  She does have a new left axis deviation on her EKG.  Of course her INR was subtherapeutic for the EGD and colonoscopy but is back on coumadin now which is for the mechanical valve.    It could be the stress of the bowel prep triggered the Afib.  She has reported some intermittent dizziness with standing.  I will start by checking an echo.  She will continue with the lopressor as needed.  Consider cardionet monitoring in the future.

## 2014-10-16 ENCOUNTER — Telehealth (HOSPITAL_COMMUNITY): Payer: Self-pay | Admitting: *Deleted

## 2014-10-24 ENCOUNTER — Ambulatory Visit (HOSPITAL_COMMUNITY)
Admission: RE | Admit: 2014-10-24 | Discharge: 2014-10-24 | Disposition: A | Discharge status: Home / Self Care | Payer: Medicare HMO | Source: Ambulatory Visit | Attending: Cardiology | Admitting: Cardiology

## 2014-10-24 DIAGNOSIS — I4891 Unspecified atrial fibrillation: Secondary | ICD-10-CM | POA: Diagnosis present

## 2014-10-24 DIAGNOSIS — I359 Nonrheumatic aortic valve disorder, unspecified: Secondary | ICD-10-CM

## 2014-10-24 NOTE — Progress Notes (Signed)
2D Echo Performed 10/24/2014    Marygrace Drought, RCS

## 2014-10-28 ENCOUNTER — Other Ambulatory Visit: Payer: Self-pay | Admitting: *Deleted

## 2014-10-28 DIAGNOSIS — Z952 Presence of prosthetic heart valve: Secondary | ICD-10-CM

## 2014-10-28 DIAGNOSIS — Q211 Atrial septal defect, unspecified: Secondary | ICD-10-CM

## 2014-10-29 ENCOUNTER — Telehealth: Payer: Self-pay | Admitting: Cardiology

## 2014-10-29 ENCOUNTER — Telehealth: Payer: Self-pay | Admitting: Physician Assistant

## 2014-10-29 NOTE — Telephone Encounter (Signed)
Called patient and informed her that the order for the MRI has been sent to [facility] for a precert.   I will call the patient when I get the appointment scheduled.

## 2014-10-29 NOTE — Telephone Encounter (Signed)
Message routed to [facility]

## 2014-10-29 NOTE — Telephone Encounter (Signed)
Pt said she received a call from Tarri Fuller stating that she had a mass in her heart. He said somebody  From the office would call to schedule a MRI. Patient says she she have not heard from anybody.

## 2014-10-30 ENCOUNTER — Encounter: Payer: Self-pay | Admitting: Cardiology

## 2014-10-30 NOTE — Telephone Encounter (Signed)
Close encounter 

## 2014-11-04 ENCOUNTER — Other Ambulatory Visit (HOSPITAL_COMMUNITY): Payer: Commercial Managed Care - HMO

## 2014-11-05 ENCOUNTER — Ambulatory Visit (HOSPITAL_COMMUNITY)
Admission: RE | Admit: 2014-11-05 | Discharge: 2014-11-05 | Disposition: A | Discharge status: Home / Self Care | Payer: Medicare HMO | Source: Ambulatory Visit | Attending: Physician Assistant | Admitting: Physician Assistant

## 2014-11-05 ENCOUNTER — Encounter: Payer: Self-pay | Admitting: Internal Medicine

## 2014-11-05 DIAGNOSIS — I771 Stricture of artery: Secondary | ICD-10-CM | POA: Diagnosis not present

## 2014-11-05 DIAGNOSIS — D1779 Benign lipomatous neoplasm of other sites: Secondary | ICD-10-CM | POA: Insufficient documentation

## 2014-11-05 DIAGNOSIS — I340 Nonrheumatic mitral (valve) insufficiency: Secondary | ICD-10-CM | POA: Insufficient documentation

## 2014-11-05 DIAGNOSIS — Z952 Presence of prosthetic heart valve: Secondary | ICD-10-CM

## 2014-11-05 DIAGNOSIS — I517 Cardiomegaly: Secondary | ICD-10-CM | POA: Insufficient documentation

## 2014-11-05 DIAGNOSIS — Q211 Atrial septal defect, unspecified: Secondary | ICD-10-CM

## 2014-11-05 DIAGNOSIS — I351 Nonrheumatic aortic (valve) insufficiency: Secondary | ICD-10-CM | POA: Diagnosis not present

## 2014-11-05 DIAGNOSIS — I5189 Other ill-defined heart diseases: Secondary | ICD-10-CM | POA: Diagnosis present

## 2014-11-05 LAB — POCT I-STAT CREATININE: Creatinine, Ser: 1.4 mg/dL — ABNORMAL HIGH (ref 0.50–1.10)

## 2014-11-05 MED ORDER — GADOBENATE DIMEGLUMINE 529 MG/ML IV SOLN
20.0000 mL | Freq: Once | INTRAVENOUS | Status: AC
  Administered 2014-11-05: 19 mL via INTRAVENOUS

## 2014-11-06 ENCOUNTER — Telehealth: Payer: Self-pay | Admitting: Physician Assistant

## 2014-11-06 ENCOUNTER — Telehealth: Payer: Self-pay | Admitting: Cardiology

## 2014-11-06 NOTE — Telephone Encounter (Signed)
Advised patient that results have not been reviewed by MD.   Will route to Dr. Martinique & Malachy Mood

## 2014-11-06 NOTE — Telephone Encounter (Signed)
I called the patient to discuss her MRI findings.  The mass was a lipomatous hypertrophy considered a benign finding.  Tarri Fuller PAC

## 2014-11-06 NOTE — Telephone Encounter (Signed)
Calling to get the results of her procedure from 11/05/14. Please Call    Thanks

## 2014-11-07 ENCOUNTER — Telehealth: Payer: Self-pay | Admitting: Cardiology

## 2014-11-07 NOTE — Telephone Encounter (Signed)
Informed the pt that per Tarri Fuller PA-C, he notified the pt of her MRI findings showing the mass was a lipomatous hypertrophy, and is  considered a benign finding.  Informed the pt on how to spell this diagnosis, for she asked how to.  Briefly went over the definition of this finding with the pt.  Pt states she is going to the [facility] office today to sign a release form to get a copy of this finding.  Pt verbalized understanding and gracious for all the assistance provided.

## 2014-11-07 NOTE — Telephone Encounter (Signed)
Spoke with Gaspar Bidding on yesterday about her test , but cannot remember what he said to her . Please call    Thanks

## 2014-11-13 NOTE — Telephone Encounter (Signed)
Returned call to patient she stated she already received results of cardiac mri.

## 2014-11-18 ENCOUNTER — Other Ambulatory Visit (HOSPITAL_COMMUNITY): Payer: Commercial Managed Care - HMO

## 2014-12-18 ENCOUNTER — Telehealth (INDEPENDENT_AMBULATORY_CARE_PROVIDER_SITE_OTHER): Payer: Self-pay

## 2014-12-18 NOTE — Telephone Encounter (Signed)
Pt called to request one of our surgeons to order a breast prosthesis.  She had a partial mastectomy followed by radiation and oncology.  Dr. Truitt Leep did the surgery about 11 years ago.  She has not been seen in this practice for several years.  She was advised to ask Dr. Jana Hakim whom she has seen more recently.  If he cannot order the prosthesis, she will call back to make an appointment to see one of our surgeons.  SL

## 2014-12-24 ENCOUNTER — Encounter (HOSPITAL_COMMUNITY): Payer: Self-pay | Admitting: Internal Medicine

## 2015-01-16 ENCOUNTER — Ambulatory Visit (INDEPENDENT_AMBULATORY_CARE_PROVIDER_SITE_OTHER): Payer: PPO | Admitting: Cardiology

## 2015-01-16 ENCOUNTER — Encounter: Payer: Self-pay | Admitting: Cardiology

## 2015-01-16 VITALS — BP 134/79 | HR 66 | Ht 60.0 in | Wt 144.3 lb

## 2015-01-16 DIAGNOSIS — Z952 Presence of prosthetic heart valve: Secondary | ICD-10-CM

## 2015-01-16 DIAGNOSIS — Z954 Presence of other heart-valve replacement: Secondary | ICD-10-CM

## 2015-01-16 DIAGNOSIS — Z7901 Long term (current) use of anticoagulants: Secondary | ICD-10-CM

## 2015-01-16 DIAGNOSIS — I480 Paroxysmal atrial fibrillation: Secondary | ICD-10-CM

## 2015-01-16 DIAGNOSIS — I10 Essential (primary) hypertension: Secondary | ICD-10-CM

## 2015-01-16 NOTE — Patient Instructions (Signed)
Continue your current therapy  I will see you in 6 months.   

## 2015-01-16 NOTE — Progress Notes (Signed)
HPI [name] is seen for followup today. She has a history of aortic valve disease and is status post aortic valve replacement with mechanical St Jude's prosthesis in 2007. She is on chronic anticoagulation. She has a history of atrial fibrillation with last episode in October 2015 during a colonoscopy. 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 She has a history of hypertension. On followup today she states she is doing very well.  She does remain unsteady on her feet at times. She doesn't think she needs to take metoprolol. No chest pain, SOB, palpitations.   Allergies  Allergen Reactions  . Demerol Shortness Of Breath and Swelling  . Meperidine Hcl Shortness Of Breath and Swelling  . Ivp Dye [Iodinated Diagnostic Agents] Hives    OK with benadryl    Current Outpatient Prescriptions on File Prior to Visit  Medication Sig Dispense Refill  . Ascorbic Acid (VITAMIN C) 1000 MG tablet Take 1,000 mg by mouth 3 (three) times daily.      . calcium citrate-vitamin D (CITRACAL+D) 315-200 MG-UNIT per tablet Take 1 tablet by mouth 3 (three) times daily.    . Cholecalciferol (VITAMIN D3) 1000 UNITS tablet Take 2,000 Units by mouth daily.     . famotidine-calcium carbonate-magnesium hydroxide (PEPCID COMPLETE) 10-800-165 MG CHEW chewable tablet Chew 1 tablet by mouth daily as needed (heartburn).    . hydrochlorothiazide (HYDRODIURIL) 25 MG tablet Take 25 mg by mouth every morning.    Marland Kitchen losartan (COZAAR) 100 MG tablet Take 100 mg by mouth every morning.     . metoprolol (LOPRESSOR) 50 MG tablet Take 0.5 tablets (25 mg total) by mouth as needed (if your heart rate is fast and you feel lightheaded, dizzy, or chest pain). (Patient taking differently: Take 50 mg by mouth as needed (if your heart rate is fast and you feel lightheaded, dizzy, or chest pain). ) 20 tablet 0  . Multiple Vitamin (MULTIVITAMIN) tablet Take 1 tablet by  mouth daily at 12 noon.     . Multiple Vitamins-Minerals (VISION-VITE PRESERVE PO) Take 1 tablet by mouth 2 (two) times daily.    Marland Kitchen oxybutynin (DITROPAN) 5 MG tablet Take 5 mg by mouth daily.    . pantoprazole (PROTONIX) 40 MG tablet Take 40 mg by mouth daily.     . simvastatin (ZOCOR) 20 MG tablet Take 20 mg by mouth every evening.    . vitamin B-12 (CYANOCOBALAMIN) 1000 MCG tablet Take 1,000 mcg by mouth every morning.    . warfarin (COUMADIN) 5 MG tablet Take 5 mg by mouth every evening.     No current facility-administered medications on file prior to visit.    Past Medical History  Diagnosis Date  . Aortic stenosis, severe   . HTN (hypertension)   . Hypercholesteremia   . PVC's (premature ventricular contractions)   . A-fib   . Venous varices    . GERD (gastroesophageal reflux disease)   . Breast CA 2004  . Chest pain   . Herpes zoster infection     History of herpes zoster infection  . Hypercholesterolemia   . Obesity   . History of kidney stones   . History of bronchitis   . Mucositis     Severe mucositis  . Irritable bowel syndrome   . History of hiatal hernia   . PONV (postoperative nausea and vomiting)     Past Surgical History  Procedure Laterality  Date  . Aortic valve replacement  04/13/06    St. Jude mechanical conduit  . Total abdominal hysterectomy    . Cystocele repair    . Rectocele repair    . Appendectomy    . Kidney stone surgery    . Foot surgery    . Cataract surgery      bilateral  . Esophageal manometry N/A 08/12/2014    Procedure: ESOPHAGEAL MANOMETRY (EM);  Surgeon: [name], MD;  Location: [facility];  Service: Endoscopy;  Laterality: N/A;  . Esophagogastroduodenoscopy N/A 10/03/2014    Procedure: ESOPHAGOGASTRODUODENOSCOPY (EGD);  Surgeon: [name], MD;  Location[facility] ENDOSCOPY;  Service: Endoscopy;  Laterality: N/A;  . Colonoscopy N/A 10/03/2014    Procedure: COLONOSCOPY;  Surgeon: [name], MD;  Location: [place];   Service: Endoscopy;  Laterality: N/A;    Family History  Problem Relation Age of Onset  . Heart disease Mother   . Heart disease Father 39    CABG  . Other Brother     AVR     History   Social History  . Marital Status: Married    Spouse Name: N/A    Number of Children: 3  . Years of Education: N/A   Occupational History  . bakery/caterer Other    retired   Social History Main Topics  . Smoking status: Never Smoker   . Smokeless tobacco: Never Used  . Alcohol Use: No  . Drug Use: No  . Sexual Activity: Not on file   Other Topics Concern  . Not on file   Social History Narrative    ROS As noted in history of present illness. All other systems were reviewed and are negative.  PHYSICAL EXAM BP 134/79 mmHg  Pulse 66  Ht 5' (1.524 m)  Wt 144 lb 4.8 oz (65.454 kg)  BMI 28.18 kg/m2 WD, elderly WF in NAD.  The HEENT exam is normal. The carotids are 2+ without bruits.  There is no thyromegaly.  There is no JVD.  The lungs are clear.  The chest wall is non tender.  The heart exam reveals a regular rate with a normal S1 and split S2.  She has a good mechanical aortic valve click.  The PMI is not displaced.   Abdominal exam reveals good bowel sounds.  There is no guarding or rebound.  There is no hepatosplenomegaly or tenderness.  There are no masses.  Exam of the legs reveal no clubbing or cyanosis. She is wearing a boot on her left foot.   The distal pulses are intact.  Cranial nerves II - XII are intact.  Motor and sensory functions are intact.  The gait is normal.  Laboratory data:   ASSESSMENT AND PLAN  1. Status post mechanical aortic valve replacement. Valve sounds are crisp today. She is on chronic anticoagulation with Coumadin.   2. Hypertension, controlled.  3. NSIVCD. Patient is asymptomatic.  4. Paroxysmal atrial fibrillation without recurrence since October. Patient is taking metoprolol PRN.

## 2015-03-18 ENCOUNTER — Emergency Department (HOSPITAL_COMMUNITY): Payer: PPO

## 2015-03-18 ENCOUNTER — Emergency Department (HOSPITAL_COMMUNITY)
Admission: EM | Admit: 2015-03-18 | Discharge: 2015-03-18 | Disposition: A | Discharge status: Home / Self Care | Payer: PPO | Attending: Emergency Medicine | Admitting: Emergency Medicine

## 2015-03-18 ENCOUNTER — Encounter (HOSPITAL_COMMUNITY): Payer: Self-pay | Admitting: Neurology

## 2015-03-18 DIAGNOSIS — I10 Essential (primary) hypertension: Secondary | ICD-10-CM | POA: Diagnosis not present

## 2015-03-18 DIAGNOSIS — Z87442 Personal history of urinary calculi: Secondary | ICD-10-CM | POA: Diagnosis not present

## 2015-03-18 DIAGNOSIS — K219 Gastro-esophageal reflux disease without esophagitis: Secondary | ICD-10-CM | POA: Insufficient documentation

## 2015-03-18 DIAGNOSIS — Z853 Personal history of malignant neoplasm of breast: Secondary | ICD-10-CM | POA: Diagnosis not present

## 2015-03-18 DIAGNOSIS — Y9389 Activity, other specified: Secondary | ICD-10-CM | POA: Diagnosis not present

## 2015-03-18 DIAGNOSIS — Z8709 Personal history of other diseases of the respiratory system: Secondary | ICD-10-CM | POA: Diagnosis not present

## 2015-03-18 DIAGNOSIS — S41112A Laceration without foreign body of left upper arm, initial encounter: Secondary | ICD-10-CM | POA: Diagnosis not present

## 2015-03-18 DIAGNOSIS — Z952 Presence of prosthetic heart valve: Secondary | ICD-10-CM | POA: Insufficient documentation

## 2015-03-18 DIAGNOSIS — E669 Obesity, unspecified: Secondary | ICD-10-CM | POA: Insufficient documentation

## 2015-03-18 DIAGNOSIS — Z79899 Other long term (current) drug therapy: Secondary | ICD-10-CM | POA: Diagnosis not present

## 2015-03-18 DIAGNOSIS — Z8619 Personal history of other infectious and parasitic diseases: Secondary | ICD-10-CM | POA: Diagnosis not present

## 2015-03-18 DIAGNOSIS — E780 Pure hypercholesterolemia: Secondary | ICD-10-CM | POA: Diagnosis not present

## 2015-03-18 DIAGNOSIS — Z7901 Long term (current) use of anticoagulants: Secondary | ICD-10-CM | POA: Diagnosis not present

## 2015-03-18 DIAGNOSIS — Y9289 Other specified places as the place of occurrence of the external cause: Secondary | ICD-10-CM | POA: Diagnosis not present

## 2015-03-18 DIAGNOSIS — W01198A Fall on same level from slipping, tripping and stumbling with subsequent striking against other object, initial encounter: Secondary | ICD-10-CM | POA: Insufficient documentation

## 2015-03-18 DIAGNOSIS — I4891 Unspecified atrial fibrillation: Secondary | ICD-10-CM | POA: Insufficient documentation

## 2015-03-18 DIAGNOSIS — Y998 Other external cause status: Secondary | ICD-10-CM | POA: Insufficient documentation

## 2015-03-18 MED ORDER — LIDOCAINE HCL 2 % IJ SOLN
20.0000 mL | Freq: Once | INTRAMUSCULAR | Status: AC
  Administered 2015-03-18: 400 mg
  Filled 2015-03-18: qty 20

## 2015-03-18 NOTE — ED Notes (Addendum)
Pt reports this morning tripped and fell backwards, hitting the back side of her left upper arm on the nightstand. Has a v-shaped laceration about 1 inch on each side. Bleeding controlled. Takes coumadin. There was no LO or hitting head. Was given tetanus shot today at doctors.

## 2015-03-18 NOTE — ED Provider Notes (Signed)
CSN: [id number]     Arrival date & time 03/18/15  [id number] History   First MD Initiated Contact with Patient 03/18/15 [phone]     Chief Complaint  Patient presents with  . Laceration     (Consider location/radiation/quality/duration/timing/severity/associated sxs/prior Treatment) HPI Patient presents to the emergency department with a laceration to the back of her left upper arm.  Patient states she was getting out of bed going to the restroom when she lost her balance and fell onto her night stand striking the back of her arm.  Patient states that she did not hit her head or lose consciousness.  She also denies chest pain, shortness of breath, weakness, headache, blurred vision, back pain, neck pain or syncope.  The patient states that she went to her primary care doctor for a physical and they advised her she would need to come here for repair of her laceration. Past Medical History  Diagnosis Date  . Aortic stenosis, severe   . HTN (hypertension)   . Hypercholesteremia   . PVC's (premature ventricular contractions)   . A-fib   . Venous varices    . GERD (gastroesophageal reflux disease)   . Breast CA 2004  . Chest pain   . Herpes zoster infection     History of herpes zoster infection  . Hypercholesterolemia   . Obesity   . History of kidney stones   . History of bronchitis   . Mucositis     Severe mucositis  . Irritable bowel syndrome   . History of hiatal hernia   . PONV (postoperative nausea and vomiting)    Past Surgical History  Procedure Laterality Date  . Aortic valve replacement  04/13/06    St. Jude mechanical conduit  . Total abdominal hysterectomy    . Cystocele repair    . Rectocele repair    . Appendectomy    . Kidney stone surgery    . Foot surgery    . Cataract surgery      bilateral  . Esophageal manometry N/A 08/12/2014    Procedure: ESOPHAGEAL MANOMETRY (EM);  Surgeon: [name], MD;  Location: [facility] ENDOS[facility];  Service: Endoscopy;  Laterality: N/A;  .  Esophagogastroduodenoscopy N/A 10/03/2014    Procedure: ESOPHAGOGASTRODUODENOSCOPY (EGD);  Surgeon: [name], MD;  Location[facility] ENDOSCOPY;  Service: Endoscopy;  Laterality: N/A;  . Colonoscopy N/A 10/03/2014    Procedure: COLONOSCOPY;  Surgeon: [name], MD;  Location: [place];  Service: Endoscopy;  Laterality: N/A;   Family History  Problem Relation Age of Onset  . Heart disease Mother   . Heart disease Father 58    CABG  . Other Brother     AVR   History  Substance Use Topics  . Smoking status: Never Smoker   . Smokeless tobacco: Never Used  . Alcohol Use: No   OB History    No data available     Review of Systems  All other systems negative except as documented in the HPI. All pertinent positives and negatives as reviewed in the HPI.  Allergies  Demerol; Meperidine hcl; Ivp dye; and Oxycodone  Home Medications   Prior to Admission medications   Medication Sig Start Date End Date Taking? Authorizing Provider  Ascorbic Acid (VITAMIN C) 1000 MG tablet Take 1,000 mg by mouth 3 (three) times daily.      Historical Provider, MD  calcium citrate-vitamin D (CITRACAL+D) 315-200 MG-UNIT per tablet Take 1 tablet by mouth 3 (three) times daily.  Historical Provider, MD  Cholecalciferol (VITAMIN D3) 1000 UNITS tablet Take 2,000 Units by mouth daily.     Historical Provider, MD  famotidine-calcium carbonate-magnesium hydroxide (PEPCID COMPLETE) 10-800-165 MG CHEW chewable tablet Chew 1 tablet by mouth daily as needed (heartburn).    Historical Provider, MD  hydrochlorothiazide (HYDRODIURIL) 25 MG tablet Take 25 mg by mouth every morning. 01/23/13   Peter M Martinique, MD  losartan (COZAAR) 100 MG tablet Take 100 mg by mouth every morning.     Historical Provider, MD  metoprolol (LOPRESSOR) 50 MG tablet Take 0.5 tablets (25 mg total) by mouth as needed (if your heart rate is fast and you feel lightheaded, dizzy, or chest pain). Patient taking differently: Take 50 mg by mouth  as needed (if your heart rate is fast and you feel lightheaded, dizzy, or chest pain).  10/03/14   Gustavus Bryant, MD  Multiple Vitamin (MULTIVITAMIN) tablet Take 1 tablet by mouth daily at 12 noon.     Historical Provider, MD  Multiple Vitamins-Minerals (VISION-VITE PRESERVE PO) Take 1 tablet by mouth 2 (two) times daily.    Historical Provider, MD  oxybutynin (DITROPAN) 5 MG tablet Take 5 mg by mouth daily.    Historical Provider, MD  pantoprazole (PROTONIX) 40 MG tablet Take 40 mg by mouth daily.     Historical Provider, MD  simvastatin (ZOCOR) 20 MG tablet Take 20 mg by mouth every evening.    Historical Provider, MD  vitamin B-12 (CYANOCOBALAMIN) 1000 MCG tablet Take 1,000 mcg by mouth every morning.    Historical Provider, MD  warfarin (COUMADIN) 5 MG tablet Take 5 mg by mouth every evening.    Historical Provider, MD   BP 126/62 mmHg  Pulse 62  Temp(Src) 98.3 F (36.8 C)  Resp 18  Ht 5' (1.524 m)  Wt 149 lb (67.586 kg)  BMI 29.10 kg/m2  SpO2 97% Physical Exam  Constitutional: She is oriented to person, place, and time. She appears well-developed and well-nourished. No distress.  HENT:  Head: Normocephalic and atraumatic.  Mouth/Throat: Oropharynx is clear and moist.  Eyes: Pupils are equal, round, and reactive to light.  Neck: Normal range of motion. Neck supple.  Cardiovascular: Normal rate, regular rhythm and normal heart sounds.  Exam reveals no gallop and no friction rub.   No murmur heard. Pulmonary/Chest: Breath sounds normal. No respiratory distress.  Musculoskeletal:       Arms: Neurological: She is alert and oriented to person, place, and time. She exhibits normal muscle tone. Coordination normal.  Skin: Skin is warm and dry. No rash noted. No erythema.  Nursing note and vitals reviewed.   ED Course  Procedures (including critical care time) Labs Review Labs Reviewed - No data to display  Imaging Review Dg Humerus Left  03/18/2015   CLINICAL DATA:  [place] at home  this morning striking LEFT humerus against night stand, laceration  EXAM: LEFT HUMERUS - 2+ VIEW  COMPARISON:  None  FINDINGS: Osseous demineralization.  AC joint alignment normal.  Shoulder and elbow joint alignments grossly normal.  No acute fracture, dislocation or bone destruction.  IMPRESSION: No acute osseous abnormalities.   Electronically Signed   By: Lavonia Dana M.D.   On: 03/18/2015 10:18    LACERATION REPAIR Performed by: Brent General Authorized by: Brent General Consent: Verbal consent obtained. Risks and benefits: risks, benefits and alternatives were discussed Consent given by: patient Patient identity confirmed: provided demographic data Prepped and Draped in normal sterile fashion Wound explored  Laceration  Location: Left upper posterior distal arm  Laceration Length: 5 cm  No Foreign Bodies seen or palpated  Anesthesia: local infiltration  Local anesthetic: lidocaine 2 % without epinephrine  Anesthetic total: 7 ml  Irrigation method: syringe Amount of cleaning: standard  Skin closure: Subcutaneous and simple interrupted   Number of sutures: 5 subcutaneous and 12 simple interrupted   Technique: See above   Patient tolerance: Patient tolerated the procedure well with no immediate complications.   Final diagnoses:  None       Dalia Heading, PA-C 03/19/15 [place], MD 03/19/15 2214

## 2015-03-18 NOTE — Discharge Instructions (Signed)
Have sutures out in 10 days.  Return here as needed.  Follow-up with her primary care Dr. for suture removal

## 2015-03-18 NOTE — ED Notes (Signed)
NAD at this time. Pt is stable and leaving with her husband. 

## 2015-04-22 ENCOUNTER — Other Ambulatory Visit: Payer: Self-pay

## 2015-04-22 DIAGNOSIS — Z1231 Encounter for screening mammogram for malignant neoplasm of breast: Secondary | ICD-10-CM

## 2015-05-27 ENCOUNTER — Ambulatory Visit
Admission: RE | Admit: 2015-05-27 | Discharge: 2015-05-27 | Disposition: A | Discharge status: Home / Self Care | Payer: PPO | Source: Ambulatory Visit

## 2015-05-27 DIAGNOSIS — Z1231 Encounter for screening mammogram for malignant neoplasm of breast: Secondary | ICD-10-CM

## 2015-05-28 ENCOUNTER — Other Ambulatory Visit: Payer: Self-pay | Admitting: Obstetrics and Gynecology

## 2015-05-28 DIAGNOSIS — R928 Other abnormal and inconclusive findings on diagnostic imaging of breast: Secondary | ICD-10-CM

## 2015-06-02 ENCOUNTER — Ambulatory Visit
Admission: RE | Admit: 2015-06-02 | Discharge: 2015-06-02 | Disposition: A | Discharge status: Home / Self Care | Payer: PPO | Source: Ambulatory Visit | Attending: Obstetrics and Gynecology | Admitting: Obstetrics and Gynecology

## 2015-06-02 ENCOUNTER — Other Ambulatory Visit: Payer: Self-pay | Admitting: Obstetrics and Gynecology

## 2015-06-02 DIAGNOSIS — R928 Other abnormal and inconclusive findings on diagnostic imaging of breast: Secondary | ICD-10-CM

## 2015-06-03 ENCOUNTER — Other Ambulatory Visit: Payer: Self-pay | Admitting: Obstetrics and Gynecology

## 2015-06-03 DIAGNOSIS — R928 Other abnormal and inconclusive findings on diagnostic imaging of breast: Secondary | ICD-10-CM

## 2015-06-10 ENCOUNTER — Ambulatory Visit
Admission: RE | Admit: 2015-06-10 | Discharge: 2015-06-10 | Disposition: A | Discharge status: Home / Self Care | Payer: PPO | Source: Ambulatory Visit | Attending: Obstetrics and Gynecology | Admitting: Obstetrics and Gynecology

## 2015-06-10 DIAGNOSIS — R928 Other abnormal and inconclusive findings on diagnostic imaging of breast: Secondary | ICD-10-CM

## 2015-08-01 ENCOUNTER — Encounter: Payer: Self-pay | Admitting: Cardiology

## 2015-08-01 ENCOUNTER — Ambulatory Visit (INDEPENDENT_AMBULATORY_CARE_PROVIDER_SITE_OTHER): Payer: PPO | Admitting: Cardiology

## 2015-08-01 VITALS — BP 138/76 | HR 95 | Ht 60.75 in | Wt 143.3 lb

## 2015-08-01 DIAGNOSIS — Z954 Presence of other heart-valve replacement: Secondary | ICD-10-CM

## 2015-08-01 DIAGNOSIS — I480 Paroxysmal atrial fibrillation: Secondary | ICD-10-CM | POA: Diagnosis not present

## 2015-08-01 DIAGNOSIS — I10 Essential (primary) hypertension: Secondary | ICD-10-CM

## 2015-08-01 DIAGNOSIS — Z7901 Long term (current) use of anticoagulants: Secondary | ICD-10-CM | POA: Diagnosis not present

## 2015-08-01 DIAGNOSIS — Z952 Presence of prosthetic heart valve: Secondary | ICD-10-CM

## 2015-08-01 MED ORDER — METOPROLOL SUCCINATE ER 25 MG PO TB24: 25.0000 mg | ORAL_TABLET | Freq: Every day | ORAL | Status: DC

## 2015-08-01 NOTE — Patient Instructions (Signed)
Take Toprol SL (metoprolol) 25 mg daily  Continue your other therapy  I will see you in 6 months.

## 2015-08-01 NOTE — Progress Notes (Signed)
HPI [name] is seen for followup today. She has a history of aortic valve disease and is status post aortic valve replacement with mechanical St Jude's prosthesis in 2007. She is on chronic anticoagulation. She has a history of paroxysmal atrial fibrillation. 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 She has a history of hypertension. On followup today she states she is doing very well.  She does remain unsteady on her feet and she tends to fall. Had a laceration of her arm in March. She has infrequent palpitations and takes metoprolol as needed but rarely. No chest pain or SOB.   Allergies  Allergen Reactions  . Demerol Shortness Of Breath and Swelling  . Meperidine Hcl Shortness Of Breath and Swelling  . Ivp Dye [Iodinated Diagnostic Agents] Hives    OK with benadryl  . Oxycodone     Hallucinations     Current Outpatient Prescriptions on File Prior to Visit  Medication Sig Dispense Refill  . Ascorbic Acid (VITAMIN C) 1000 MG tablet Take 1,000 mg by mouth 3 (three) times daily.      . calcium citrate-vitamin D (CITRACAL+D) 315-200 MG-UNIT per tablet Take 1 tablet by mouth 3 (three) times daily.    . Cholecalciferol (VITAMIN D3) 1000 UNITS tablet Take 2,000 Units by mouth daily.     . famotidine-calcium carbonate-magnesium hydroxide (PEPCID COMPLETE) 10-800-165 MG CHEW chewable tablet Chew 1 tablet by mouth daily as needed (heartburn).    . Multiple Vitamin (MULTIVITAMIN) tablet Take 1 tablet by mouth daily at 12 noon.     . Multiple Vitamins-Minerals (VISION-VITE PRESERVE PO) Take 1 tablet by mouth 2 (two) times daily.    Marland Kitchen oxybutynin (DITROPAN) 5 MG tablet Take 5 mg by mouth daily.    . pantoprazole (PROTONIX) 40 MG tablet Take 40 mg by mouth daily.     . simvastatin (ZOCOR) 20 MG tablet Take 20 mg by mouth every evening.    . vitamin B-12 (CYANOCOBALAMIN) 1000 MCG tablet Take 1,000 mcg by mouth every  morning.    . warfarin (COUMADIN) 5 MG tablet Take 5 mg by mouth every evening.     No current facility-administered medications on file prior to visit.    Past Medical History  Diagnosis Date  . Aortic stenosis, severe   . HTN (hypertension)   . Hypercholesteremia   . PVC's (premature ventricular contractions)   . A-fib   . Venous varices    . GERD (gastroesophageal reflux disease)   . Breast CA 2004  . Chest pain   . Herpes zoster infection     History of herpes zoster infection  . Hypercholesterolemia   . Obesity   . History of kidney stones   . History of bronchitis   . Mucositis     Severe mucositis  . Irritable bowel syndrome   . History of hiatal hernia   . PONV (postoperative nausea and vomiting)     Past Surgical History  Procedure Laterality Date  . Aortic valve replacement  04/13/06    St. Jude mechanical conduit  . Total abdominal hysterectomy    . Cystocele repair    . Rectocele repair    . Appendectomy    . Kidney stone surgery    . Foot surgery    . Cataract surgery      bilateral  . Esophageal manometry N/A 08/12/2014    Procedure: ESOPHAGEAL MANOMETRY (EM);  Surgeon:  [name], MD;  Location: [facility] ENDOSCOPY;  Service: Endoscopy;  Laterality: N/A;  . Esophagogastroduodenoscopy N/A 10/03/2014    Procedure: ESOPHAGOGASTRODUODENOSCOPY (EGD);  Surgeon: [name], MD;  Location: [facility] ENDOSCOPY;  Service: Endoscopy;  Laterality: N/A;  . Colonoscopy N/A 10/03/2014    Procedure: COLONOSCOPY;  Surgeon: [name], MD;  Location: [place];  Service: Endoscopy;  Laterality: N/A;    Family History  Problem Relation Age of Onset  . Heart disease Mother   . Heart disease Father 66    CABG  . Other Brother     AVR     History   Social History  . Marital Status: Married    Spouse Name: N/A  . Number of Children: 3  . Years of Education: N/A   Occupational History  . bakery/caterer Other    retired   Social History Main Topics  .  Smoking status: Never Smoker   . Smokeless tobacco: Never Used  . Alcohol Use: No  . Drug Use: No  . Sexual Activity: Not on file   Other Topics Concern  . Not on file   Social History Narrative    ROS As noted in history of present illness. All other systems were reviewed and are negative.  PHYSICAL EXAM BP 138/76 mmHg  Pulse 95  Ht 5' 0.75" (1.543 m)  Wt 65.006 kg (143 lb 5 oz)  BMI 27.30 kg/m2 WD, elderly WF in NAD.  The HEENT exam is normal. The carotids are 2+ without bruits.  There is no thyromegaly.  There is no JVD.  The lungs are clear.  The chest wall is non tender.  The heart exam reveals an irregular rate with a normal S1 and split S2.  She has a good mechanical aortic valve click.  The PMI is not displaced.   Abdominal exam reveals good bowel sounds.  There is no guarding or rebound.  There is no hepatosplenomegaly or tenderness.  There are no masses.  Exam of the legs reveal no clubbing or cyanosis. She is wearing a boot on her left foot.   The distal pulses are intact.  Cranial nerves II - XII are intact.  Motor and sensory functions are intact.  The gait is normal.  Laboratory data: Ecg shows NSR with PACs converting to atrial fibrillation. LBBB. I have personally reviewed and interpreted this study.   ASSESSMENT AND PLAN  1. Status post mechanical aortic valve replacement. Valve sounds are crisp today. She is on chronic anticoagulation with Coumadin. Managed by [facility].   2. Hypertension, controlled.  3. LBBB. Patient is asymptomatic.  4. Paroxysmal atrial fibrillation. In Afib today. I think she is having more Afib than she realizes. Will start Toprol XL 25 mg daily. Continue coumadin therapy.

## 2016-01-06 DIAGNOSIS — I358 Other nonrheumatic aortic valve disorders: Secondary | ICD-10-CM | POA: Diagnosis not present

## 2016-01-06 DIAGNOSIS — Z7901 Long term (current) use of anticoagulants: Secondary | ICD-10-CM | POA: Diagnosis not present

## 2016-01-22 DIAGNOSIS — N182 Chronic kidney disease, stage 2 (mild): Secondary | ICD-10-CM | POA: Diagnosis not present

## 2016-01-22 DIAGNOSIS — I10 Essential (primary) hypertension: Secondary | ICD-10-CM | POA: Diagnosis not present

## 2016-01-22 DIAGNOSIS — Z6828 Body mass index (BMI) 28.0-28.9, adult: Secondary | ICD-10-CM | POA: Diagnosis not present

## 2016-01-22 DIAGNOSIS — R42 Dizziness and giddiness: Secondary | ICD-10-CM | POA: Diagnosis not present

## 2016-01-22 DIAGNOSIS — H9312 Tinnitus, left ear: Secondary | ICD-10-CM | POA: Diagnosis not present

## 2016-01-22 DIAGNOSIS — I358 Other nonrheumatic aortic valve disorders: Secondary | ICD-10-CM | POA: Diagnosis not present

## 2016-01-23 ENCOUNTER — Other Ambulatory Visit: Payer: Self-pay | Admitting: Internal Medicine

## 2016-01-23 DIAGNOSIS — R42 Dizziness and giddiness: Secondary | ICD-10-CM

## 2016-01-23 DIAGNOSIS — H9312 Tinnitus, left ear: Secondary | ICD-10-CM

## 2016-01-23 DIAGNOSIS — R51 Headache: Secondary | ICD-10-CM

## 2016-01-23 DIAGNOSIS — R519 Headache, unspecified: Secondary | ICD-10-CM

## 2016-01-24 ENCOUNTER — Ambulatory Visit
Admission: RE | Admit: 2016-01-24 | Discharge: 2016-01-24 | Disposition: A | Discharge status: Home / Self Care | Payer: PPO | Source: Ambulatory Visit | Attending: Internal Medicine | Admitting: Internal Medicine

## 2016-01-24 DIAGNOSIS — R519 Headache, unspecified: Secondary | ICD-10-CM

## 2016-01-24 DIAGNOSIS — R42 Dizziness and giddiness: Secondary | ICD-10-CM

## 2016-01-24 DIAGNOSIS — H9313 Tinnitus, bilateral: Secondary | ICD-10-CM | POA: Diagnosis not present

## 2016-01-24 DIAGNOSIS — H9312 Tinnitus, left ear: Secondary | ICD-10-CM

## 2016-01-24 DIAGNOSIS — R51 Headache: Secondary | ICD-10-CM

## 2016-02-10 DIAGNOSIS — I358 Other nonrheumatic aortic valve disorders: Secondary | ICD-10-CM | POA: Diagnosis not present

## 2016-02-10 DIAGNOSIS — Z7901 Long term (current) use of anticoagulants: Secondary | ICD-10-CM | POA: Diagnosis not present

## 2016-02-23 ENCOUNTER — Encounter: Payer: Self-pay | Admitting: Cardiology

## 2016-02-23 ENCOUNTER — Ambulatory Visit (INDEPENDENT_AMBULATORY_CARE_PROVIDER_SITE_OTHER): Payer: PPO | Admitting: Cardiology

## 2016-02-23 VITALS — BP 140/90 | HR 60 | Ht 60.75 in | Wt 139.4 lb

## 2016-02-23 DIAGNOSIS — I10 Essential (primary) hypertension: Secondary | ICD-10-CM | POA: Diagnosis not present

## 2016-02-23 DIAGNOSIS — I480 Paroxysmal atrial fibrillation: Secondary | ICD-10-CM

## 2016-02-23 DIAGNOSIS — R42 Dizziness and giddiness: Secondary | ICD-10-CM

## 2016-02-23 DIAGNOSIS — Z954 Presence of other heart-valve replacement: Secondary | ICD-10-CM

## 2016-02-23 DIAGNOSIS — Z952 Presence of prosthetic heart valve: Secondary | ICD-10-CM

## 2016-02-23 NOTE — Patient Instructions (Signed)
Continue your current therapy  We will refer you to neurology for evaluation of your imbalance.  I will see you in 6 months

## 2016-02-24 NOTE — Progress Notes (Signed)
HPI [name] is seen for followup AVR.  She has a history of aortic valve disease and is status post aortic valve replacement with mechanical St Jude's prosthesis in 2007. She is on chronic anticoagulation. She has a history of paroxysmal atrial fibrillation. 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 She has a history of hypertension.   On followup today she states she is doing OK.  She reports she is more unsteady on her feet and falls easily. She especially has difficult when she turns around. Recently developed neck pain and tinnitus. Cranial MRI showed chronic small vessel disease without acute change.   No chest pain or SOB.   Allergies  Allergen Reactions  . Demerol Shortness Of Breath and Swelling  . Meperidine Hcl Shortness Of Breath and Swelling  . Ivp Dye [Iodinated Diagnostic Agents] Hives    OK with benadryl  . Oxycodone     Hallucinations     Current Outpatient Prescriptions on File Prior to Visit  Medication Sig Dispense Refill  . Ascorbic Acid (VITAMIN C) 1000 MG tablet Take 1,000 mg by mouth 3 (three) times daily.      . calcium citrate-vitamin D (CITRACAL+D) 315-200 MG-UNIT per tablet Take 1 tablet by mouth 3 (three) times daily.    . Cholecalciferol (VITAMIN D3) 1000 UNITS tablet Take 2,000 Units by mouth daily.     . famotidine-calcium carbonate-magnesium hydroxide (PEPCID COMPLETE) 10-800-165 MG CHEW chewable tablet Chew 1 tablet by mouth daily as needed (heartburn).    . metoprolol succinate (TOPROL XL) 25 MG 24 hr tablet Take 1 tablet (25 mg total) by mouth daily. 90 tablet 3  . Multiple Vitamin (MULTIVITAMIN) tablet Take 1 tablet by mouth daily at 12 noon.     . Multiple Vitamins-Minerals (VISION-VITE PRESERVE PO) Take 1 tablet by mouth 2 (two) times daily.    Marland Kitchen oxybutynin (DITROPAN) 5 MG tablet Take 5 mg by mouth daily.    . pantoprazole (PROTONIX) 40 MG tablet Take 40 mg by mouth  daily.     . simvastatin (ZOCOR) 20 MG tablet Take 20 mg by mouth every evening.    . triamterene-hydrochlorothiazide (MAXZIDE-25) 37.5-25 MG per tablet Take 1 tablet by mouth daily.    . vitamin B-12 (CYANOCOBALAMIN) 1000 MCG tablet Take 1,000 mcg by mouth every morning.    . warfarin (COUMADIN) 5 MG tablet Take 5 mg by mouth every evening.     No current facility-administered medications on file prior to visit.    Past Medical History  Diagnosis Date  . Aortic stenosis, severe   . HTN (hypertension)   . Hypercholesteremia   . PVC's (premature ventricular contractions)   . A-fib ([place])   . Venous varices    . GERD (gastroesophageal reflux disease)   . Breast CA ([place]) 2004  . Chest pain   . Herpes zoster infection     History of herpes zoster infection  . Hypercholesterolemia   . Obesity   . History of kidney stones   . History of bronchitis   . Mucositis     Severe mucositis  . Irritable bowel syndrome   . History of hiatal hernia   . PONV (postoperative nausea and vomiting)     Past Surgical History  Procedure Laterality Date  . Aortic valve replacement  04/13/06    St. Jude mechanical conduit  . Total abdominal hysterectomy    . Cystocele repair    .  Rectocele repair    . Appendectomy    . Kidney stone surgery    . Foot surgery    . Cataract surgery      bilateral  . Esophageal manometry N/A 08/12/2014    Procedure: ESOPHAGEAL MANOMETRY (EM);  Surgeon: [name], MD;  Location: [facility];  Service: Endoscopy;  Laterality: N/A;  . Esophagogastroduodenoscopy N/A 10/03/2014    Procedure: ESOPHAGOGASTRODUODENOSCOPY (EGD);  Surgeon: [name], MD;  Location: [facility] ENDOSCOPY;  Service: Endoscopy;  Laterality: N/A;  . Colonoscopy N/A 10/03/2014    Procedure: COLONOSCOPY;  Surgeon: [name], MD;  Location: [facility];  Service: Endoscopy;  Laterality: N/A;    Family History  Problem Relation Age of Onset  . Heart disease Mother   . Heart disease Father  65    CABG  . Other Brother     AVR     Social History   Social History  . Marital Status: Married    Spouse Name: N/A  . Number of Children: 3  . Years of Education: N/A   Occupational History  . bakery/caterer Other    retired   Social History Main Topics  . Smoking status: Never Smoker   . Smokeless tobacco: Never Used  . Alcohol Use: No  . Drug Use: No  . Sexual Activity: Not on file   Other Topics Concern  . Not on file   Social History Narrative    ROS As noted in history of present illness. All other systems were reviewed and are negative.  PHYSICAL EXAM BP 140/90 mmHg  Pulse 60  Ht 5' 0.75" (1.543 m)  Wt 63.231 kg (139 lb 6.4 oz)  BMI 26.56 kg/m2 WD, elderly WF in NAD.  The HEENT exam is normal. The carotids are 2+ without bruits.  There is no thyromegaly.  There is no JVD.  The lungs are clear.  The heart exam reveals an irregular rate with a normal S1.  She has a good mechanical aortic valve click.  The PMI is not displaced.   Abdominal exam reveals good bowel sounds.  There is no guarding or rebound.  There is no hepatosplenomegaly or tenderness.  There are no masses.  Exam of the legs reveal no clubbing or cyanosis. She has a lot of varicose veins.   The distal pulses are intact.  Cranial nerves II - XII are intact.  Motor and sensory functions are intact.  The gait is unsteady and she almost fell turning to get on the exam table.   Laboratory data: CLINICAL DATA: Severe dizziness and bilateral tinnitus, 3 days duration. History of breast cancer treated surgically in 2004. Some confusion and gait disturbance.  EXAM: MRI HEAD WITHOUT CONTRAST  TECHNIQUE: Multiplanar, multiecho pulse sequences of the brain and surrounding structures were obtained without intravenous contrast.  COMPARISON: Head CT 01/25/2003  FINDINGS: Diffusion imaging does not show any acute or subacute infarction. There chronic small-vessel changes affecting the pons. There  is scattered old small vessel cerebellar infarctions, some of which are associated with punctate hemosiderin deposition. Within the cerebral hemispheres, there are moderate changes of chronic appearing small vessel disease of the deep and subcortical white matter, several associated with punctate hemosiderin deposition. No cortical or large vessel territory infarction. No mass lesion, acute hemorrhage, hydrocephalus or extra-axial collection. No pituitary mass. No inflammatory sinus disease. No fluid in the middle ears or mastoids.  IMPRESSION: No acute or reversible finding. No specific cause of the acute change is identified.  Chronic small-vessel ischemic changes affecting the brainstem, cerebellum and cerebral hemispheric white matter, several associated with hemosiderin deposition indicating micro hemorrhagic nature at the time of occurrence.   Electronically Signed  By: Nelson Chimes M.D.  On: 01/24/2016 09:55  ASSESSMENT AND PLAN  1. Status post mechanical aortic valve replacement. Valve sounds are crisp today. She is on chronic anticoagulation with Coumadin. Managed by [facility].   2. Hypertension, controlled.  3. LBBB. Chronic.   4. Paroxysmal atrial fibrillation. Continue rate control and anticoagulation.   5. Gait and balance instability. I am concerned about her risk of falls being on coumadin. Recent MRI without acute findings. Discussed trying to fall proof her home. Will refer to Neurology for evaluation.

## 2016-03-09 DIAGNOSIS — Z7901 Long term (current) use of anticoagulants: Secondary | ICD-10-CM | POA: Diagnosis not present

## 2016-03-09 DIAGNOSIS — I358 Other nonrheumatic aortic valve disorders: Secondary | ICD-10-CM | POA: Diagnosis not present

## 2016-03-22 ENCOUNTER — Encounter: Payer: Self-pay | Admitting: Neurology

## 2016-03-22 ENCOUNTER — Ambulatory Visit (INDEPENDENT_AMBULATORY_CARE_PROVIDER_SITE_OTHER): Payer: PPO | Admitting: Neurology

## 2016-03-22 VITALS — BP 130/64 | HR 64 | Wt 143.0 lb

## 2016-03-22 DIAGNOSIS — R2681 Unsteadiness on feet: Secondary | ICD-10-CM | POA: Diagnosis not present

## 2016-03-22 DIAGNOSIS — R258 Other abnormal involuntary movements: Secondary | ICD-10-CM
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
## 2016-03-22 NOTE — Progress Notes (Signed)
Chart forwarded.  

## 2016-03-22 NOTE — Patient Instructions (Addendum)
You may have Parkinson's disease, but you don't entirely fit the criteria for it.  Therefore, I will check with my colleague, Dr. Carles Collet, to see if we should either try a trial of carbidopa-levodopa, which is used to treat parkinsons (and see if you do better) or you should be evaluated by Dr. Carles Collet first.  Either way, we will contact you later today.  Changes in the brain due to history of high cholesterol and high blood pressure, may be a cause as well.  At this point, I do not suspect it coming from the neck.  In the meantime, we will send you to physical therapy for your balance.  You definitely will need an assisted walking device, such as a cane or walker.  Follow up soon

## 2016-03-25 DIAGNOSIS — Z7901 Long term (current) use of anticoagulants: Secondary | ICD-10-CM | POA: Diagnosis not present

## 2016-03-25 DIAGNOSIS — I358 Other nonrheumatic aortic valve disorders: Secondary | ICD-10-CM | POA: Diagnosis not present

## 2016-03-29 ENCOUNTER — Ambulatory Visit: Payer: PPO | Attending: Neurology | Admitting: Rehabilitative and Restorative Service Providers"

## 2016-03-29 DIAGNOSIS — R2689 Other abnormalities of gait and mobility: Secondary | ICD-10-CM

## 2016-03-29 DIAGNOSIS — Z9181 History of falling: Secondary | ICD-10-CM | POA: Diagnosis not present

## 2016-03-29 NOTE — Therapy (Signed)
[facility] [address] [place], [place], [address] Phone: [phone]   Fax:  [phone]  Physical Therapy Evaluation  Patient Details  Name: [name] MRN: [id number] Date of Birth: 76-26-1942 Referring Provider: Metta Clines, MD  Encounter Date: 03/29/2016      PT End of Session - 03/29/16 0938    Visit Number 1   Number of Visits 16   Date for PT Re-Evaluation 05/28/16   Authorization Type G code required every 10th visit   PT Start Time 0804   PT Stop Time 0850   PT Time Calculation (min) 46 min   Equipment Utilized During Treatment Gait belt   Activity Tolerance Patient tolerated treatment well   Behavior During Therapy [facility] for tasks assessed/performed      Past Medical History  Diagnosis Date  . Aortic stenosis, severe   . HTN (hypertension)   . Hypercholesteremia   . PVC's (premature ventricular contractions)   . A-fib ([place])   . Venous varices    . GERD (gastroesophageal reflux disease)   . Breast CA ([place]) 2004  . Chest pain   . Herpes zoster infection     History of herpes zoster infection  . Hypercholesterolemia   . Obesity   . History of kidney stones   . History of bronchitis   . Mucositis     Severe mucositis  . Irritable bowel syndrome   . History of hiatal hernia   . PONV (postoperative nausea and vomiting)     Past Surgical History  Procedure Laterality Date  . Aortic valve replacement  04/13/06    St. Jude mechanical conduit  . Total abdominal hysterectomy    . Cystocele repair    . Rectocele repair    . Appendectomy    . Kidney stone surgery    . Foot surgery    . Cataract surgery      bilateral  . Esophageal manometry N/A 08/12/2014    Procedure: ESOPHAGEAL MANOMETRY (EM);  Surgeon: [name], MD;  Location: [facility] ENDOSCOPY;  Service: Endoscopy;  Laterality: N/A;  . Esophagogastroduodenoscopy N/A 10/03/2014    Procedure: ESOPHAGOGASTRODUODENOSCOPY (EGD);  Surgeon:  [name], MD;  Location: [facility] ENDOSCOPY;  Service: Endoscopy;  Laterality: N/A;  . Colonoscopy N/A 10/03/2014    Procedure: COLONOSCOPY;  Surgeon: [name], MD;  Location: [place];  Service: Endoscopy;  Laterality: N/A;    There were no vitals filed for this visit.  Visit Diagnosis:  Other abnormalities of gait and mobility  History of falling      Subjective Assessment - 03/29/16 0808    Subjective The patient reports frequent falls reporting 3 falls in the past week.  She reports recent increase in # of falls and progressively worsening gait.  The majority of falls occur when indoors and happen during turns.     Pertinent History h/o L ear pain 2 months ago with dizziness.     Patient Stated Goals Improve balance.     Currently in Pain? Yes  chronic condition from osteoarthritis   Pain Score --  "a little bit"   Pain Location Back   Pain Orientation Lower   Pain Descriptors / Indicators Aching   Pain Type Chronic pain   Pain Onset More than a month ago   Pain Frequency Intermittent   Aggravating Factors  worse with certain movements   Pain Relieving Factors varies  [facility] PT Assessment - 03/29/16 0814    Assessment   Medical Diagnosis gait instability   Referring Provider Metta Clines, MD   Onset Date/Surgical Date --  gradual onset x 1 year   Prior Therapy none   Precautions   Precautions Fall   Precaution Comments Patient has h/o foot surgery when husband first noted imbalance, patient also reports macular degeneration.   Balance Screen   Has the patient fallen in the past 6 months Yes   How many times? multiple- 3 falls last week, declining   Has the patient had a decrease in activity level because of a fear of falling?  Yes   Is the patient reluctant to leave their home because of a fear of falling?  Yes   [place] Private residence   Living Arrangements Spouse/significant other   Type of [place] to enter   Entrance Stairs-Number of Steps 1   [place] Two level;Able to live on main level with bedroom/bathroom   [place] - single point   Prior Function   Level of Independence Independent  declining moiblity with falls   Observation/Other Assessments   Focus on Therapeutic Outcomes (FOTO)  48%   Other Surveys  --  Activities of Balance Confidence=41.9%   Sensation   Light Touch Appears Intact   Coordination   Gross Motor Movements are Fluid and Coordinated Yes  no evidence of tremors noted   Posture/Postural Control   Posture/Postural Control Postural limitations   Postural Limitations Rounded Shoulders;Forward head   ROM / Strength   AROM / PROM / Strength Strength   Strength   Overall Strength Comments 4/5 bilateral shoulder flexion/abduction, 5/5 bilateral elbow flexion, 3+/5 bilateral hip flexion 5/5 bilateral knee flexion, 5/5 bilateral knee extension, 4/5 ankle dorsiflexion.   Transfers   Transfers Sit to Stand;Stand to Sit   Sit to Stand 6: Modified independent (Device/Increase time)   Five time sit to stand comments  12.53 seconds with CGA and holding the chiar due to posterior lean.   Ambulation/Gait   Ambulation Distance (Feet) 300 Feet   Assistive device None   Gait Pattern Decreased stride length;Right foot flat;Left foot flat;Festinating  Festination noted during turns   Ambulation Surface Level   Gait velocity 2.45 ft/sec   Stairs Yes   Stairs Assistance 5: Supervision   Stairs Assistance Details (indicate cue type and reason) Education re: getting foot completely on step while ascending vs allowing heels to hang off posterior aspect of step.   Stair Management Technique Two rails;Alternating pattern   Number of Stairs 4  x 3 reps   Gait Comments Performed education on use of SPC (patient has new cane made by son-in-law) with cues to use opposite LE and placement lateral to path of walking to avoid kicking the cane.  Patient  needs continued cues for safe ambulation with SPC.   Standardized Balance Assessment   Standardized Balance Assessment Berg Balance Test;Timed Up and Go Test   Berg Balance Test   Sit to Stand Able to stand  independently using hands   Standing Unsupported Able to stand 2 minutes with supervision   Sitting with Back Unsupported but Feet Supported on Floor or Stool Able to sit safely and securely 2 minutes   Stand to Sit Sits safely with minimal use of hands   Transfers Able to transfer safely, definite need of hands   Standing Unsupported with Eyes Closed Able  to stand 10 seconds with supervision   Standing Ubsupported with Feet Together Able to place feet together independently and stand for 1 minute with supervision   From Standing, Reach Forward with Outstretched Arm Can reach forward >12 cm safely (5")   From Standing Position, Pick up Object from [place] to pick up shoe safely and easily   From Standing Position, Turn to Look Behind Over each Shoulder Turn sideways only but maintains balance   Turn 360 Degrees Needs assistance while turning   Standing Unsupported, Alternately Place Feet on Step/Stool Needs assistance to keep from falling or unable to try   Standing Unsupported, One Foot in Front Needs help to step but can hold 15 seconds   Standing on One Leg Able to lift leg independently and hold equal to or more than 3 seconds   Total Score 35   Berg comment: 35/56   Timed Up and Go Test   TUG --  16.66 seconds without a device with SBA            Vestibular Assessment - 03/29/16 0822    Vestibular Assessment   General Observation patient c/o soreness in left side of neck with onset during dizziness with ear pain on L side   Occulomotor Exam   Occulomotor Alignment Normal   Spontaneous Absent   Gaze-induced Absent   Smooth Pursuits --  moving to R note a couple beats of saccadic pursuit   Saccades Slow   Vestibulo-Occular Reflex   Comment Note corrective saccade at  slow pace to left side.  Head impulse test positive to both sides indicating decreased VOR x 1 viewing.    Positional Testing   Sidelying Test Sidelying Right;Sidelying Left   Sidelying Right   Sidelying Right Duration reports "lightheaded" sensation, no nystagmus   Sidelying Right Symptoms No nystagmus   Sidelying Left   Sidelying Left Duration reports "lightheaded" sensation worse when returning to sit from L sidelying   Sidelying Left Symptoms No nystagmus               [facility] Adult PT Treatment/Exercise - 03/29/16 [id number]    Ambulation/Gait   Ambulation/Gait Yes   Neuro Re-ed    Neuro Re-ed Details  Wide base of support with lateral rocking transitioning to turns.  Education re: tips to reduce freezing during turns.                PT Education - 03/29/16 0936    Education provided Yes   Education Details Goals of physical therapy, use of single point cane, pointers to use during festination of gait during turns (practiced wide base of support with lateral rocking).   Person(s) Educated Patient;Spouse   Methods Explanation;Demonstration   Comprehension Verbalized understanding;Returned demonstration          PT Short Term Goals - 03/29/16 0939    PT SHORT TERM GOAL #1   Title The patient will be able to perform HEP with assist from her husband.   Baseline Target date: 04/28/2016   Time 4   Period Weeks   PT SHORT TERM GOAL #2   Title The patient will improve gait speed from 2.45 ft/sec to > or equal to 2.7 ft/sec to demo transition to "full community ambulator" classification of gait.   Baseline Target date: 04/28/2016   Time 4   Period Weeks   PT SHORT TERM GOAL #3   Title The patient will improve Berg score from 35/56 to > or equal to  40/56 to demo decreased fall risk.   Baseline Target date: 04/28/2016   Time 4   Period Weeks   PT SHORT TERM GOAL #4   Title The patient will perform sit<>stand without UE support x 5 repetitions from chair without assistance.    Baseline Target date: 04/28/2016   Time 4   Period Weeks   PT SHORT TERM GOAL #5   Title The patient will move floor<>stand with UE support modified indep.   Baseline Target date: 04/28/2016   Time 4   Period Weeks           PT Long Term Goals - 03/29/16 0942    PT LONG TERM GOAL #1   Title The patient will improve activities of balance confidence score from 41.9% to > or equal to 55%.   Baseline Target date: 05/29/2016   Time 8   Period Weeks   PT LONG TERM GOAL #2   Title The patient will improve Berg from 35/56 to > or equal to 42/56 to demo decreasing risk for falls.   Baseline Target date: 05/29/2016   Time 8   Period Weeks   PT LONG TERM GOAL #3   Title The patient will reduce TUG score from 16.66 seconds to < or equal to 13.5 seconds to demo decreasing risk for falls.   Baseline Target date: 05/29/2016   Time 8   Period Weeks   PT LONG TERM GOAL #4   Title The patient will negotiate indoor surfaces without a devide independently evidenced by no loss of balance on level surfaces x 300 feet.   Baseline Target date: 05/29/2016   Time 8   Period Weeks   PT LONG TERM GOAL #5   Title The patient will move sit<>stand x 5 reps in < or equal to 10 seconds (reduced from 12.53 at baseline).   Baseline Target date: 05/29/2016   Time 8   Period Weeks               Plan - 03/29/16 0945    Clinical Impression Statement The patient is a 75 year old female referred to physical therapy due to worsening instability during gait activities, falls and worsening overall mobility.  The patient has multi-factorial fall risk factors with h/o foot surgery x 2 on R foot (when husband first noted worsening mobility), h/o macular degeneration, and reports of intermittent dizziness (noted dizziness 2 months ago with L ear pain).  The patient also has a festinating gait pattern worse during turns.  She notes some dizziness, as well as sensation that she can't move her feet during turns.  PT to  emphasize saety during household + community mobility and general balance/mobility training to optimize current functional status.   Pt will benefit from skilled therapeutic intervention in order to improve on the following deficits Abnormal gait;Decreased activity tolerance;Decreased balance;Decreased mobility;Decreased strength;Pain;Difficulty walking;Dizziness   Rehab Potential Good   PT Frequency 2x / week   PT Duration 8 weeks   PT Treatment/Interventions ADLs/Self Care Home Management;Neuromuscular re-education;Balance training;Vestibular;Therapeutic exercise;Therapeutic activities;Functional mobility training;Stair training;Gait training;Patient/family education   PT Next Visit Plan Educate in [place]! up exercises for HEP (begin with sitting and progress to standing as safe), safety with SPC, gait mechanics, balance   Recommended Other Services Consider OT services.   Consulted and Agree with Plan of Care Patient         Problem List Patient Active Problem List   Diagnosis Date Noted  . Family history of colon  cancer 07/30/2014  . Hiatal hernia 07/30/2014  . Chest pain 10/13/2011  . Disequilibrium 10/13/2011  . Heart valve replaced by other means 04/16/2011  . Anticoagulant long-term use 03/17/2011  . S/P AVR (aortic valve replacement)   . PVC's (premature ventricular contractions)   . HTN (hypertension)   . Hypercholesteremia   . A-fib ([place])   . GERD (gastroesophageal reflux disease)   . GERD 02/06/2010  . DIVERTICULOSIS-COLON 02/06/2010  . PERSONAL HX COLONIC POLYPS 02/06/2010  . MASTECTOMY, HX OF 02/06/2010    Savannaha Stonerock, PT 03/29/2016, 9:50 AM  [facility] [address] [place], [place], [address] Phone: [phone]   Fax:  [phone]  Name: RAYMA HEITZMAN MRN: [id number] Date of Birth: [birth year]

## 2016-04-12 ENCOUNTER — Ambulatory Visit: Payer: PPO | Admitting: Rehabilitative and Restorative Service Providers"

## 2016-04-13 ENCOUNTER — Ambulatory Visit: Payer: PPO | Admitting: Rehabilitative and Restorative Service Providers"

## 2016-04-13 DIAGNOSIS — R2689 Other abnormalities of gait and mobility: Secondary | ICD-10-CM | POA: Diagnosis not present

## 2016-04-13 DIAGNOSIS — Z9181 History of falling: Secondary | ICD-10-CM

## 2016-04-13 NOTE — Patient Instructions (Signed)
Functional Quadriceps: Sit to Stand    Sit on edge of chair, feet flat on floor. Stand upright, extending knees fully.  YOU CAN USE YOUR HANDS TO BEGIN.  WHEN YOU STAND UP COME UP TO A BIG POSTURE. Repeat _10___ times per set. Do __2__ sets per session. Do _1-2___ sessions per day.  http://orth.exer.us/735   Copyright  VHI. All rights reserved.  AROM: Neck Rotation    Turn head slowly to look over one shoulder, then the other. Hold each position __5__ seconds.  MOVE TO A POINT OF GENTLE PULL, NOT PAIN. Repeat _10___ times per set. Do __1__ sets per session. Do _1-2___ sessions per day.  http://orth.exer.us/295   Copyright  VHI. All rights reserved.  Side-Stepping    Walk to left side with eyes open. Take even steps, leading with same foot. Make sure each foot lifts off the floor. Repeat in opposite direction. Repeat for __10 FEET ALONG COUNTERTOP, switch directions. Do __1-2__ sessions per day.  Copyright  VHI. All rights reserved.

## 2016-04-13 NOTE — Therapy (Signed)
[facility] [address] [place], [place], [address] Phone: [phone]   Fax:  [phone]  Physical Therapy Treatment  Patient Details  Name: [name] MRN: [id number] Date of Birth: 1941/07/27 Referring Provider: Metta Clines, MD  Encounter Date: 04/13/2016      PT End of Session - 04/13/16 0759    Visit Number 2   Number of Visits 16   Date for PT Re-Evaluation 05/28/16   Authorization Type G code required every 10th visit   PT Start Time 0800   PT Stop Time 0843   PT Time Calculation (min) 43 min   Equipment Utilized During Treatment Gait belt   Activity Tolerance Patient tolerated treatment well   Behavior During Therapy [facility] for tasks assessed/performed      Past Medical History  Diagnosis Date  . Aortic stenosis, severe   . HTN (hypertension)   . Hypercholesteremia   . PVC's (premature ventricular contractions)   . A-fib ([place])   . Venous varices    . GERD (gastroesophageal reflux disease)   . Breast CA ([place]) 2004  . Chest pain   . Herpes zoster infection     History of herpes zoster infection  . Hypercholesterolemia   . Obesity   . History of kidney stones   . History of bronchitis   . Mucositis     Severe mucositis  . Irritable bowel syndrome   . History of hiatal hernia   . PONV (postoperative nausea and vomiting)     Past Surgical History  Procedure Laterality Date  . Aortic valve replacement  04/13/06    St. Jude mechanical conduit  . Total abdominal hysterectomy    . Cystocele repair    . Rectocele repair    . Appendectomy    . Kidney stone surgery    . Foot surgery    . Cataract surgery      bilateral  . Esophageal manometry N/A 08/12/2014    Procedure: ESOPHAGEAL MANOMETRY (EM);  Surgeon: [name], MD;  Location: [facility];  Service: Endoscopy;  Laterality: N/A;  . Esophagogastroduodenoscopy N/A 10/03/2014    Procedure: ESOPHAGOGASTRODUODENOSCOPY (EGD);  Surgeon:  [name], MD;  Location: [facility] ENDOSCOPY;  Service: Endoscopy;  Laterality: N/A;  . Colonoscopy N/A 10/03/2014    Procedure: COLONOSCOPY;  Surgeon: [name], MD;  Location: [place];  Service: Endoscopy;  Laterality: N/A;    There were no vitals filed for this visit.      Subjective Assessment - 04/13/16 0802    Subjective The patient notes less frequent falls when using SPC.  She brought her cane from home and is not using correctly.   Patient Stated Goals Improve balance.     Currently in Pain? Yes   Pain Score --  "small" amount of pain   Pain Location Neck   Pain Orientation Left   Pain Descriptors / Indicators Aching   Pain Type Chronic pain   Pain Onset More than a month ago   Pain Frequency Intermittent   Aggravating Factors  worse during walking or in car when going over bump   Pain Relieving Factors sitting                         [facility] Adult PT Treatment/Exercise - 04/13/16 0823    Ambulation/Gait   Ambulation/Gait Yes   Ambulation/Gait Assistance 4: Min guard   Ambulation Distance (Feet) 230 Feet  x 6  reps   Assistive device Straight cane   Ambulation Surface Level   Gait Comments Reviewed correct cane use. Emphasized cane placement, and longer stride length during gait activities.     Neuro Re-ed    Neuro Re-ed Details  Side stepping without UE support, sit<>stand emphasizing upright/tall posture with transitions.  Seated PWR! exercises with trunk rotation seated x 10 reps, sit<>lateral trunk lean with reaching towards the ceiling x 10 reps to each side.    Exercises   Exercises Other Exercises   Other Exercises  Heel and toe raises x 10 reps near support surface for safety.     Manual Therapy   Manual Therapy Manual Traction;Soft tissue mobilization   Soft tissue mobilization --  L scalenes and L levator scapulae to patient tolerance   Manual Traction supine gentle suboccipital release with manual traction x 8 minutes to patient  tolerance                PT Education - 04/13/16 0833    Education provided Yes   Education Details HEP: side stepping, neck A/ROM, sit<>stand   Person(s) Educated Patient;Spouse   Methods Explanation;Demonstration;Handout   Comprehension Returned demonstration;Verbalized understanding          PT Short Term Goals - 03/29/16 0939    PT SHORT TERM GOAL #1   Title The patient will be able to perform HEP with assist from her husband.   Baseline Target date: 04/28/2016   Time 4   Period Weeks   PT SHORT TERM GOAL #2   Title The patient will improve gait speed from 2.45 ft/sec to > or equal to 2.7 ft/sec to demo transition to "full community ambulator" classification of gait.   Baseline Target date: 04/28/2016   Time 4   Period Weeks   PT SHORT TERM GOAL #3   Title The patient will improve Berg score from 35/56 to > or equal to 40/56 to demo decreased fall risk.   Baseline Target date: 04/28/2016   Time 4   Period Weeks   PT SHORT TERM GOAL #4   Title The patient will perform sit<>stand without UE support x 5 repetitions from chair without assistance.   Baseline Target date: 04/28/2016   Time 4   Period Weeks   PT SHORT TERM GOAL #5   Title The patient will move floor<>stand with UE support modified indep.   Baseline Target date: 04/28/2016   Time 4   Period Weeks           PT Long Term Goals - 03/29/16 0942    PT LONG TERM GOAL #1   Title The patient will improve activities of balance confidence score from 41.9% to > or equal to 55%.   Baseline Target date: 05/29/2016   Time 8   Period Weeks   PT LONG TERM GOAL #2   Title The patient will improve Berg from 35/56 to > or equal to 42/56 to demo decreasing risk for falls.   Baseline Target date: 05/29/2016   Time 8   Period Weeks   PT LONG TERM GOAL #3   Title The patient will reduce TUG score from 16.66 seconds to < or equal to 13.5 seconds to demo decreasing risk for falls.   Baseline Target date: 05/29/2016   Time 8    Period Weeks   PT LONG TERM GOAL #4   Title The patient will negotiate indoor surfaces without a devide independently evidenced by no loss of balance on  level surfaces x 300 feet.   Baseline Target date: 05/29/2016   Time 8   Period Weeks   PT LONG TERM GOAL #5   Title The patient will move sit<>stand x 5 reps in < or equal to 10 seconds (reduced from 12.53 at baseline).   Baseline Target date: 05/29/2016   Time 8   Period Weeks               Plan - 04/13/16 [id number]    Clinical Impression Statement The patient continues to need cues for appropriate use of SPC during gait.  She has neck stiffness that limits scanning environment during gait and creates a "popping" sensation during walking.  She responds well to verbal cues with demonstration for HEP.  Continue to LTGs.   Rehab Potential Good   PT Frequency 2x / week   PT Duration 8 weeks   PT Treatment/Interventions ADLs/Self Care Home Management;Neuromuscular re-education;Balance training;Vestibular;Therapeutic exercise;Therapeutic activities;Functional mobility training;Stair training;Gait training;Patient/family education   PT Next Visit Plan Educate in [place]! up exercises for HEP (begin with sitting and progress to standing as safe), safety with SPC, gait mechanics, balance      Patient will benefit from skilled therapeutic intervention in order to improve the following deficits and impairments:  Abnormal gait, Decreased activity tolerance, Decreased balance, Decreased mobility, Decreased strength, Pain, Difficulty walking, Dizziness  Visit Diagnosis: Other abnormalities of gait and mobility  History of falling     Problem List Patient Active Problem List   Diagnosis Date Noted  . Family history of colon cancer 07/30/2014  . Hiatal hernia 07/30/2014  . Chest pain 10/13/2011  . Disequilibrium 10/13/2011  . Heart valve replaced by other means 04/16/2011  . Anticoagulant long-term use 03/17/2011  . S/P AVR (aortic valve  replacement)   . PVC's (premature ventricular contractions)   . HTN (hypertension)   . Hypercholesteremia   . A-fib ([place])   . GERD (gastroesophageal reflux disease)   . GERD 02/06/2010  . DIVERTICULOSIS-COLON 02/06/2010  . PERSONAL HX COLONIC POLYPS 02/06/2010  . MASTECTOMY, HX OF 02/06/2010    Hriday Stai, PT 04/13/2016, 8:47 AM  [facility] [address] [place], [place], [address] Phone: [phone]   Fax:  [phone]  Name: MURRIEL WAYE MRN: [id number] Date of Birth: [birth year]

## 2016-04-15 ENCOUNTER — Telehealth: Payer: Self-pay | Admitting: Neurology

## 2016-04-15 ENCOUNTER — Ambulatory Visit: Payer: PPO | Admitting: Rehabilitative and Restorative Service Providers"

## 2016-04-15 DIAGNOSIS — Z9181 History of falling: Secondary | ICD-10-CM

## 2016-04-15 DIAGNOSIS — R2689 Other abnormalities of gait and mobility: Secondary | ICD-10-CM

## 2016-04-15 NOTE — Telephone Encounter (Signed)
Please schedule pt w/ Dr. Carles Collet

## 2016-04-15 NOTE — Telephone Encounter (Signed)
Called patient and got her on to see dr tat for 04-23-16

## 2016-04-15 NOTE — Telephone Encounter (Signed)
[name] [birth year] called wanting to follow up about her last appointment with Dr. Tomi [name]. She was told that he would be referring her to see Dr. Carles [name]. She does have another appointment to see Dr. Tomi [name] in May. Her call back number is [phone]. Thank you

## 2016-04-15 NOTE — Therapy (Signed)
[facility] [address] [place], [place], [address] Phone: [phone]   Fax:  [phone]  Physical Therapy Treatment  Patient Details  Name: [name] MRN: [id number] Date of Birth: [birth year] Referring Provider: Metta Clines, MD  Encounter Date: 04/15/2016      PT End of Session - 04/15/16 0930    Visit Number 2   Number of Visits 16   Date for PT Re-Evaluation 05/28/16   Authorization Type G code required every 10th visit   PT Start Time 0843   PT Stop Time 0925   PT Time Calculation (min) 42 min   Equipment Utilized During Treatment Gait belt   Activity Tolerance Patient tolerated treatment well   Behavior During Therapy [facility] for tasks assessed/performed      Past Medical History  Diagnosis Date  . Aortic stenosis, severe   . HTN (hypertension)   . Hypercholesteremia   . PVC's (premature ventricular contractions)   . A-fib ([place])   . Venous varices    . GERD (gastroesophageal reflux disease)   . Breast CA ([place]) 2004  . Chest pain   . Herpes zoster infection     History of herpes zoster infection  . Hypercholesterolemia   . Obesity   . History of kidney stones   . History of bronchitis   . Mucositis     Severe mucositis  . Irritable bowel syndrome   . History of hiatal hernia   . PONV (postoperative nausea and vomiting)     Past Surgical History  Procedure Laterality Date  . Aortic valve replacement  04/13/06    St. Jude mechanical conduit  . Total abdominal hysterectomy    . Cystocele repair    . Rectocele repair    . Appendectomy    . Kidney stone surgery    . Foot surgery    . Cataract surgery      bilateral  . Esophageal manometry N/A 08/12/2014    Procedure: ESOPHAGEAL MANOMETRY (EM);  Surgeon: [name], MD;  Location: [facility];  Service: Endoscopy;  Laterality: N/A;  . Esophagogastroduodenoscopy N/A 10/03/2014    Procedure: ESOPHAGOGASTRODUODENOSCOPY (EGD);  Surgeon:  [name], MD;  Location: [facility] ENDOSCOPY;  Service: Endoscopy;  Laterality: N/A;  . Colonoscopy N/A 10/03/2014    Procedure: COLONOSCOPY;  Surgeon: [name], MD;  Location: [facility];  Service: Endoscopy;  Laterality: N/A;    There were no vitals filed for this visit.      Subjective Assessment - 04/15/16 0841    Subjective The patient reports she is doing HEP and was sore in her neck (on the left side) after PT on 4/18.     Pertinent History h/o L ear pain 2 months ago with dizziness.     Patient Stated Goals Improve balance.     Currently in Pain? Yes   Pain Score --  "not a lot"   Pain Location Neck   Pain Orientation Left   Pain Descriptors / Indicators Aching   Pain Type Chronic pain   Pain Onset More than a month ago   Pain Frequency Intermittent   Aggravating Factors  movement, walking,    Pain Relieving Factors sitting, not moving             [facility] Adult PT Treatment/Exercise - 04/15/16 0917    Transfers   Transfers Sit to Stand   Comments Performed for exercise with ball overhead x 10 reps   Ambulation/Gait  Ambulation/Gait Yes   Ambulation/Gait Assistance 4: Min guard   Ambulation Distance (Feet) 300 Feet  x 3 reps   Stairs Yes   Stairs Assistance 5: Supervision   Number of Stairs 4  x 4 reps   Gait Comments Cues on longer stride length   Manual Therapy   Manual Therapy Manual Traction;Soft tissue mobilization   Manual therapy comments Passive overpressure with pain worse with L sidebending and L rotation to L and mild ROM limitation to R due to soft tissue tightness   Soft tissue mobilization L upper trap, L scalenes and suboccipital release   Manual Traction supine gentle manual distraction and downglides grade I-II on L side to increase facet rotation needed for sidebending and rotation.            PWR [facility]) - 04/15/16 [id number]    PWR! Up x 10 reps sit<>stand PWR UP!   PWR Step lateral power step with UE movement in parallel bars for  balance.  Ant/post steps x 5 reps each leg with min A for safety outside of parallel bars.   Comments Sidestepping emphasizing power up posture and large amplitude movements for steps.   PWR! [employer] in functional context of 360 degree turns emphasizing lateral weight shifting for greater techique and to prevent festination.          LSVT [facility]) - 04/15/16 0855    Rock and Reach Forward/Backward 10 reps              PT Short Term Goals - 03/29/16 0939    PT SHORT TERM GOAL #1   Title The patient will be able to perform HEP with assist from her husband.   Baseline Target date: 04/28/2016   Time 4   Period Weeks   PT SHORT TERM GOAL #2   Title The patient will improve gait speed from 2.45 ft/sec to > or equal to 2.7 ft/sec to demo transition to "full community ambulator" classification of gait.   Baseline Target date: 04/28/2016   Time 4   Period Weeks   PT SHORT TERM GOAL #3   Title The patient will improve Berg score from 35/56 to > or equal to 40/56 to demo decreased fall risk.   Baseline Target date: 04/28/2016   Time 4   Period Weeks   PT SHORT TERM GOAL #4   Title The patient will perform sit<>stand without UE support x 5 repetitions from chair without assistance.   Baseline Target date: 04/28/2016   Time 4   Period Weeks   PT SHORT TERM GOAL #5   Title The patient will move floor<>stand with UE support modified indep.   Baseline Target date: 04/28/2016   Time 4   Period Weeks           PT Long Term Goals - 03/29/16 0942    PT LONG TERM GOAL #1   Title The patient will improve activities of balance confidence score from 41.9% to > or equal to 55%.   Baseline Target date: 05/29/2016   Time 8   Period Weeks   PT LONG TERM GOAL #2   Title The patient will improve Berg from 35/56 to > or equal to 42/56 to demo decreasing risk for falls.   Baseline Target date: 05/29/2016   Time 8   Period Weeks   PT LONG TERM GOAL #3   Title The patient will reduce TUG  score from 16.66 seconds to < or equal  to 13.5 seconds to demo decreasing risk for falls.   Baseline Target date: 05/29/2016   Time 8   Period Weeks   PT LONG TERM GOAL #4   Title The patient will negotiate indoor surfaces without a devide independently evidenced by no loss of balance on level surfaces x 300 feet.   Baseline Target date: 05/29/2016   Time 8   Period Weeks   PT LONG TERM GOAL #5   Title The patient will move sit<>stand x 5 reps in < or equal to 10 seconds (reduced from 12.53 at baseline).   Baseline Target date: 05/29/2016   Time 8   Period Weeks               Plan - 04/15/16 0930    Clinical Impression Statement The patient has joint limitation at neck that produce pain with L rotation and L sidebending on the L side of the neck.  Also will benefit from soft tissue lengthening of L musculature.  PT to continue emphasizing posture, balance and safety during mobility to improve functional outcomes.  Continue to STGs/LTGs.   PT Treatment/Interventions ADLs/Self Care Home Management;Neuromuscular re-education;Balance training;Vestibular;Therapeutic exercise;Therapeutic activities;Functional mobility training;Stair training;Gait training;Patient/family education   PT Next Visit Plan Check HEP, gait (safety with SPC) Power moves without device, gait mechanics, balance.   Consulted and Agree with Plan of Care Patient      Patient will benefit from skilled therapeutic intervention in order to improve the following deficits and impairments:  Abnormal gait, Decreased activity tolerance, Decreased balance, Decreased mobility, Decreased strength, Pain, Difficulty walking, Dizziness  Visit Diagnosis: Other abnormalities of gait and mobility  History of falling     Problem List Patient Active Problem List   Diagnosis Date Noted  . Family history of colon cancer 07/30/2014  . Hiatal hernia 07/30/2014  . Chest pain 10/13/2011  . Disequilibrium 10/13/2011  . Heart valve  replaced by other means 04/16/2011  . Anticoagulant long-term use 03/17/2011  . S/P AVR (aortic valve replacement)   . PVC's (premature ventricular contractions)   . HTN (hypertension)   . Hypercholesteremia   . A-fib ([place])   . GERD (gastroesophageal reflux disease)   . GERD 02/06/2010  . DIVERTICULOSIS-COLON 02/06/2010  . PERSONAL HX COLONIC POLYPS 02/06/2010  . MASTECTOMY, HX OF 02/06/2010    Airen Stiehl, PT 04/15/2016, 9:31 AM  [facility] [address] [place], [place], [address] Phone: [phone]   Fax:  [phone]  Name: [name] MRN: [id number] Date of Birth: 1941/06/12

## 2016-04-15 NOTE — Telephone Encounter (Signed)
Yes, I wanted her to see Dr. Carles Collet before following up with me.

## 2016-04-15 NOTE — Telephone Encounter (Signed)
Per last A&P: "Given the lack of other symptoms to corroborate a diagnosis of Parkinson's disease, I would like to refer her to my colleague, Dr. Carles Collet, for her opinion regarding this diagnosis. If Parkinson's disease is felt to be unlikely, we will pursue other testing for a diagnosis."  Is it okay to schedule w/ Dr. Carles Collet? Is she aware? Please advise.

## 2016-04-20 ENCOUNTER — Ambulatory Visit: Payer: PPO | Admitting: Rehabilitative and Restorative Service Providers"

## 2016-04-20 DIAGNOSIS — R2689 Other abnormalities of gait and mobility: Secondary | ICD-10-CM

## 2016-04-20 DIAGNOSIS — Z9181 History of falling: Secondary | ICD-10-CM

## 2016-04-20 NOTE — Therapy (Signed)
[facility] [address] [place], [place], [address] Phone: [phone]   Fax:  [phone]  Physical Therapy Treatment  Patient Details  Name: [name] MRN: [id number] Date of Birth: Aug 27, 1941 Referring Provider: Metta Clines, MD  Encounter Date: 04/20/2016      PT End of Session - 04/20/16 0903    Visit Number 4   Number of Visits 16   Date for PT Re-Evaluation 05/28/16   Authorization Type G code required every 10th visit   PT Start Time 0850   PT Stop Time 0931   PT Time Calculation (min) 41 min   Equipment Utilized During Treatment Gait belt   Activity Tolerance Patient tolerated treatment well   Behavior During Therapy [facility] for tasks assessed/performed      Past Medical History  Diagnosis Date  . Aortic stenosis, severe   . HTN (hypertension)   . Hypercholesteremia   . PVC's (premature ventricular contractions)   . A-fib ([place])   . Venous varices    . GERD (gastroesophageal reflux disease)   . Breast CA ([place]) 2004  . Chest pain   . Herpes zoster infection     History of herpes zoster infection  . Hypercholesterolemia   . Obesity   . History of kidney stones   . History of bronchitis   . Mucositis     Severe mucositis  . Irritable bowel syndrome   . History of hiatal hernia   . PONV (postoperative nausea and vomiting)     Past Surgical History  Procedure Laterality Date  . Aortic valve replacement  04/13/06    St. Jude mechanical conduit  . Total abdominal hysterectomy    . Cystocele repair    . Rectocele repair    . Appendectomy    . Kidney stone surgery    . Foot surgery    . Cataract surgery      bilateral  . Esophageal manometry N/A 08/12/2014    Procedure: ESOPHAGEAL MANOMETRY (EM);  Surgeon: [name], MD;  Location: [facility];  Service: Endoscopy;  Laterality: N/A;  . Esophagogastroduodenoscopy N/A 10/03/2014    Procedure: ESOPHAGOGASTRODUODENOSCOPY (EGD);  Surgeon:  [name], MD;  Location: [facility] ENDOSCOPY;  Service: Endoscopy;  Laterality: N/A;  . Colonoscopy N/A 10/03/2014    Procedure: COLONOSCOPY;  Surgeon: [name], MD;  Location: [facility];  Service: Endoscopy;  Laterality: N/A;    There were no vitals filed for this visit.      Subjective Assessment - 04/20/16 0853    Subjective No falls.  "My neck still crunches when I turn it."   Patient Stated Goals Improve balance.     Currently in Pain? Yes   Pain Score 0-No pain   Pain Location Neck   Pain Orientation Left   Pain Descriptors / Indicators Aching   Pain Type Chronic pain   Pain Onset More than a month ago   Pain Frequency Intermittent   Aggravating Factors  movement   Pain Relieving Factors not moving            [facility] Adult PT Treatment/Exercise - 04/20/16 1007    Ambulation/Gait   Ambulation/Gait Yes   Ambulation/Gait Assistance 5: Supervision;6: Modified independent (Device/Increase time)   Ambulation/Gait Assistance Details in therapy, ambulated with [facility] with PT demonstrating/educating patient + husband on correct height of device, marking her cane for changes in height.  Also worked without device for improved PWR UE arm swing   Ambulation  Distance (Feet) 345 Feet  x 5 reps during therapy session   Assistive device Straight cane  and no device   Ambulation Surface Level   Stairs Yes   Stairs Assistance 5: Supervision   Number of Stairs 4  x 3 reps   Gait Comments Needs cues on steps on getting feet placed on step (avoiding only toes on steps) for safer technique.  Provided PWR up cues for posture during gait and tactile cues for arm swing from posterior position to patient. Forward/backward walking and heel/toe walking with CGA.  Marching with PWR cues and CGA.   Neuro Re-ed    Neuro Re-ed Details  Rocker board balance exercises also emphasizing upright posture with scapular retraction standing with min A.    Exercises   Exercises Neck   Neck Exercises:  Seated   Postural Training Seated scapular retraction with red theraband x 10 reps x 2 sets.   Neck Exercises: Supine   Neck Retraction 5 reps  with gentle manual resistance   Other Supine Exercise Supine P/ROM with overpressure into rotation and sidebending.  *Per observation, ROM improving for rotation, still more limited in sidebending.   Other Supine Exercise towel roll stretch for anterior chest musculature with emphasis on elongating L anterior musculature with passive overpressure   Manual Therapy   Manual Therapy Manual Traction;Soft tissue mobilization           PWR [facility]) - 04/20/16 6333    PWR! Up x 10 reps   PWR Step lateral PWR steps with cues for bigger push off to return to midline and exaggerated UE movements   Reaching Comments --         LSVT [facility]) - 04/20/16 0923    Rock and Reach Forward/Backward 10 reps  with CGA due to loss of balance   Rock and Reach Sideways 10 reps  using post it note targets on cabinets for reaching target            PT Education - 04/20/16 0948    Education provided Yes   Education Details Correct cane height   Person(s) Educated Patient;Spouse   Methods Explanation;Demonstration   Comprehension Verbalized understanding          PT Short Term Goals - 04/20/16 0949    PT SHORT TERM GOAL #1   Title The patient will be able to perform HEP with assist from her husband.   Baseline Target date: 04/28/2016   Time 4   Period Weeks   PT SHORT TERM GOAL #2   Title The patient will improve gait speed from 2.45 ft/sec to > or equal to 2.7 ft/sec to demo transition to "full community ambulator" classification of gait.   Baseline Target date: 04/28/2016   Time 4   Period Weeks   PT SHORT TERM GOAL #3   Title The patient will improve Berg score from 35/56 to > or equal to 40/56 to demo decreased fall risk.   Baseline Target date: 04/28/2016   Time 4   Period Weeks   PT SHORT TERM GOAL #4   Title The patient will perform sit<>stand  without UE support x 5 repetitions from chair without assistance.   Baseline Met on 04/20/2016   Time 4   Period Weeks   Status Achieved   PT SHORT TERM GOAL #5   Title The patient will move floor<>stand with UE support modified indep.   Baseline Target date: 04/28/2016   Time 4   Period [place]  PT Long Term Goals - 03/29/16 0929    PT LONG TERM GOAL #1   Title The patient will improve activities of balance confidence score from 41.9% to > or equal to 55%.   Baseline Target date: 05/29/2016   Time 8   Period Weeks   PT LONG TERM GOAL #2   Title The patient will improve Berg from 35/56 to > or equal to 42/56 to demo decreasing risk for falls.   Baseline Target date: 05/29/2016   Time 8   Period Weeks   PT LONG TERM GOAL #3   Title The patient will reduce TUG score from 16.66 seconds to < or equal to 13.5 seconds to demo decreasing risk for falls.   Baseline Target date: 05/29/2016   Time 8   Period Weeks   PT LONG TERM GOAL #4   Title The patient will negotiate indoor surfaces without a devide independently evidenced by no loss of balance on level surfaces x 300 feet.   Baseline Target date: 05/29/2016   Time 8   Period Weeks   PT LONG TERM GOAL #5   Title The patient will move sit<>stand x 5 reps in < or equal to 10 seconds (reduced from 12.53 at baseline).   Baseline Target date: 05/29/2016   Time 8   Period Weeks               Plan - 04/20/16 5747    Clinical Impression Statement The patient is improving with safety during standing PWR tasks, but still has occasional losses of balance requiring assist to recover.  Patient met STG for sit<>stand activities.  PT to continue progressing to STGs/LTGs.   PT Treatment/Interventions ADLs/Self Care Home Management;Neuromuscular re-education;Balance training;Vestibular;Therapeutic exercise;Therapeutic activities;Functional mobility training;Stair training;Gait training;Patient/family education   PT Next Visit Plan Add PWR  exercises to home (if able to safely do near support surface), check STGs over next 2 sessions, gait activities, balance, neck/postural stabilization   Consulted and Agree with Plan of Care Patient      Patient will benefit from skilled therapeutic intervention in order to improve the following deficits and impairments:  Abnormal gait, Decreased activity tolerance, Decreased balance, Decreased mobility, Decreased strength, Pain, Difficulty walking, Dizziness  Visit Diagnosis: Other abnormalities of gait and mobility  History of falling     Problem List Patient Active Problem List   Diagnosis Date Noted  . Family history of colon cancer 07/30/2014  . Hiatal hernia 07/30/2014  . Chest pain 10/13/2011  . Disequilibrium 10/13/2011  . Heart valve replaced by other means 04/16/2011  . Anticoagulant long-term use 03/17/2011  . S/P AVR (aortic valve replacement)   . PVC's (premature ventricular contractions)   . HTN (hypertension)   . Hypercholesteremia   . A-fib ([place])   . GERD (gastroesophageal reflux disease)   . GERD 02/06/2010  . DIVERTICULOSIS-COLON 02/06/2010  . PERSONAL HX COLONIC POLYPS 02/06/2010  . MASTECTOMY, HX OF 02/06/2010    Jaki Steptoe , PT  04/20/2016, 9:53 AM  [facility] [address] [place], [place], [address] Phone: [phone]   Fax:  [phone]  Name: [name] MRN: [id number] Date of Birth: August 19, 1941

## 2016-04-22 ENCOUNTER — Ambulatory Visit: Payer: PPO | Admitting: Rehabilitative and Restorative Service Providers"

## 2016-04-22 DIAGNOSIS — Z7901 Long term (current) use of anticoagulants: Secondary | ICD-10-CM | POA: Diagnosis not present

## 2016-04-22 DIAGNOSIS — I358 Other nonrheumatic aortic valve disorders: Secondary | ICD-10-CM | POA: Diagnosis not present

## 2016-04-23 ENCOUNTER — Encounter: Payer: Self-pay | Admitting: Neurology

## 2016-04-23 ENCOUNTER — Ambulatory Visit: Payer: PPO | Admitting: Physical Therapy

## 2016-04-23 ENCOUNTER — Ambulatory Visit (INDEPENDENT_AMBULATORY_CARE_PROVIDER_SITE_OTHER): Payer: PPO | Admitting: Neurology

## 2016-04-23 VITALS — BP 118/68 | HR 60 | Ht 59.75 in | Wt 141.0 lb

## 2016-04-23 DIAGNOSIS — G231 Progressive supranuclear ophthalmoplegia [Steele-Richardson-Olszewski]: Secondary | ICD-10-CM

## 2016-04-23 DIAGNOSIS — R2689 Other abnormalities of gait and mobility: Secondary | ICD-10-CM

## 2016-04-23 DIAGNOSIS — F458 Other somatoform disorders: Secondary | ICD-10-CM

## 2016-04-23 DIAGNOSIS — R1319 Other dysphagia: Secondary | ICD-10-CM

## 2016-04-23 MED ORDER — CARBIDOPA-LEVODOPA 25-100 MG PO TABS: 1.0000 | ORAL_TABLET | Freq: Three times a day (TID) | ORAL | Status: DC

## 2016-04-23 NOTE — Patient Instructions (Addendum)
1. We have referred you to [facility] for a modified barium swallow. They will call you directly to schedule this appt. If you do not hear from them call [phone]. 2. Start Carbidopa Levodopa as follows:  Take 1/2 tablet three times daily, at least 30 minutes before meals, for one week  Then take 1/2 tablet in the morning, 1/2 tablet in the afternoon, 1 tablet in the evening, at least 30 minutes before meals, for one week  Then take 1/2 tablet in the morning, 1 tablet in the afternoon, 1 tablet in the evening, at least 30 minutes before meals, for one week Then take 1 tablet three times daily, at least 30 minutes before meals

## 2016-04-23 NOTE — Therapy (Signed)
[facility] [address] [place], [place], [address] Phone: [phone]   Fax:  [phone]  Physical Therapy Treatment  Patient Details  Name: [name] MRN: [id number] Date of Birth: 03-Jan-1941 Referring Provider: Metta Clines, MD  Encounter Date: 04/23/2016      PT End of Session - 04/23/16 1216    Visit Number 5   Number of Visits 16   Date for PT Re-Evaluation 05/28/16   Authorization Type G code required every 10th visit   PT Start Time 0804   PT Stop Time 0844   PT Time Calculation (min) 40 min   Activity Tolerance Patient tolerated treatment well   Behavior During Therapy [facility] for tasks assessed/performed      Past Medical History  Diagnosis Date  . Aortic stenosis, severe   . HTN (hypertension)   . Hypercholesteremia   . PVC's (premature ventricular contractions)   . A-fib (HCC)     paroxysmal  . Venous varices    . GERD (gastroesophageal reflux disease)   . Breast CA ([place]) 2004  . Chest pain   . Herpes zoster infection     History of herpes zoster infection  . Hypercholesterolemia   . Obesity   . History of kidney stones   . History of bronchitis   . Mucositis     Severe mucositis  . Irritable bowel syndrome   . History of hiatal hernia   . PONV (postoperative nausea and vomiting)   . Hemifacial spasm     Past Surgical History  Procedure Laterality Date  . Aortic valve replacement  04/13/06    St. Jude mechanical conduit  . Total abdominal hysterectomy    . Cystocele repair    . Rectocele repair    . Appendectomy    . Kidney stone surgery    . Foot surgery    . Cataract surgery      bilateral  . Esophageal manometry N/A 08/12/2014    Procedure: ESOPHAGEAL MANOMETRY (EM);  Surgeon: [name], MD;  Location: [facility] ENDOSCOPY;  Service: Endoscopy;  Laterality: N/A;  . Esophagogastroduodenoscopy N/A 10/03/2014    Procedure: ESOPHAGOGASTRODUODENOSCOPY (EGD);  Surgeon: [name], MD;  Location: [facility] ENDOSCOPY;  Service: Endoscopy;  Laterality: N/A;  . Colonoscopy N/A 10/03/2014    Procedure: COLONOSCOPY;  Surgeon: [name], MD;  Location: [facility];  Service: Endoscopy;  Laterality: N/A;    There were no vitals filed for this visit.      Subjective Assessment - 04/23/16 0805    Subjective Neck hurts mainly when I turn it.  Pt brought in her cane and son-in-law cut it to the right height.   Pertinent History h/o L ear pain 2 months ago with dizziness.     Patient Stated Goals Improve balance.     Pain Score 5    Pain Location Neck   Pain Orientation Left   Pain Descriptors / Indicators Aching   Pain Type Chronic pain   Pain Onset More than a month ago   Pain Frequency Intermittent   Aggravating Factors  turning head to left   Pain Relieving Factors not turning/moving                         [facility] Adult PT Treatment/Exercise - 04/23/16 0001    Ambulation/Gait   Ambulation/Gait Assistance 5: Supervision;4: Min assist   Ambulation/Gait Assistance Details Min assist at times for therapist assist  for consistent, deliberate cane placement and sequence   Ambulation Distance (Feet) 330 Feet  then 200 ft, 100 ft   Assistive device Straight cane   Ambulation Surface Level;Indoor   Pre-Gait Activities [facility] with LLE step-through and weight shift, with cane placement, then keeping cane in place with RLE step through, 2 sets x 10 reps   Gait Comments Pt able to maintain proper sequence with cane for about 100 ft, then reverts to picking up cane with each foot taking a step.  PT provides hand over hand cues as well as verbal cues for deliberate cane placement and sequence.           PWR [facility]) - 04/23/16 0349    PWR! exercises Moves in standing   PWR! Up x 10 reps   PWR! Rock x 10 reps   PWR Step x 10 reps to the side, with cues for added arm reach and larger amplitude for push-off to return to start position.   Then forward step x 10 reps each leg, at counter with 1 UE support, with cues for deliberate, large amplitude effort for foot clearance, push-off to return to widened BOS stance.   Comments Added as HEP-performed second set, with 5 reps each with handout in front of pt, PT providing verbal cues.             PT Education - 04/23/16 1216    Education provided Yes   Education Details Additions to [employer]   Person(s) Educated Patient   Methods Explanation;Demonstration;Handout   Comprehension Verbalized understanding;Returned demonstration;Verbal cues required;Need further instruction          PT Short Term Goals - 04/20/16 0949    PT SHORT TERM GOAL #1   Title The patient will be able to perform HEP with assist from her husband.   Baseline Target date: 04/28/2016   Time 4   Period Weeks   PT SHORT TERM GOAL #2   Title The patient will improve gait speed from 2.45 ft/sec to > or equal to 2.7 ft/sec to demo transition to "full community ambulator" classification of gait.   Baseline Target date: 04/28/2016   Time 4   Period Weeks   PT SHORT TERM GOAL #3   Title The patient will improve Berg score from 35/56 to > or equal to 40/56 to demo decreased fall risk.   Baseline Target date: 04/28/2016   Time 4   Period Weeks   PT SHORT TERM GOAL #4   Title The patient will perform sit<>stand without UE support x 5 repetitions from chair without assistance.   Baseline Met on 04/20/2016   Time 4   Period Weeks   Status Achieved   PT SHORT TERM GOAL #5   Title The patient will move floor<>stand with UE support modified indep.   Baseline Target date: 04/28/2016   Time 4   Period Weeks           PT Long Term Goals - 03/29/16 0942    PT LONG TERM GOAL #1   Title The patient will improve activities of balance confidence score from 41.9% to > or equal to 55%.   Baseline Target date: 05/29/2016   Time 8   Period Weeks   PT LONG TERM GOAL #2   Title The patient will improve  Berg from 35/56 to > or equal to 42/56 to demo decreasing risk for falls.   Baseline Target date: 05/29/2016   Time 8  Period Weeks   PT LONG TERM GOAL #3   Title The patient will reduce TUG score from 16.66 seconds to < or equal to 13.5 seconds to demo decreasing risk for falls.   Baseline Target date: 05/29/2016   Time 8   Period Weeks   PT LONG TERM GOAL #4   Title The patient will negotiate indoor surfaces without a devide independently evidenced by no loss of balance on level surfaces x 300 feet.   Baseline Target date: 05/29/2016   Time 8   Period Weeks   PT LONG TERM GOAL #5   Title The patient will move sit<>stand x 5 reps in < or equal to 10 seconds (reduced from 12.53 at baseline).   Baseline Target date: 05/29/2016   Time 8   Period Weeks               Plan - 04/23/16 1216    Clinical Impression Statement Provided patient with HEP today addressing large amplitude movement patterns in standing with cues for deliberate pacing of movements with at least 1 UE support for balance.  Pt continues to need cues with gait for consistent proper sequence with cane.   PT Treatment/Interventions ADLs/Self Care Home Management;Neuromuscular re-education;Balance training;Vestibular;Therapeutic exercise;Therapeutic activities;Functional mobility training;Stair training;Gait training;Patient/family education   PT Next Visit Plan Review HEP provided last visit; check STGs; focus on deliberate pacing of movement, gait training with cues   Consulted and Agree with Plan of Care Patient      Patient will benefit from skilled therapeutic intervention in order to improve the following deficits and impairments:  Abnormal gait, Decreased activity tolerance, Decreased balance, Decreased mobility, Decreased strength, Pain, Difficulty walking, Dizziness  Visit Diagnosis: Other abnormalities of gait and mobility     Problem List Patient Active Problem List   Diagnosis Date Noted  . Family  history of colon cancer 07/30/2014  . Hiatal hernia 07/30/2014  . Chest pain 10/13/2011  . Disequilibrium 10/13/2011  . Heart valve replaced by other means 04/16/2011  . Anticoagulant long-term use 03/17/2011  . S/P AVR (aortic valve replacement)   . PVC's (premature ventricular contractions)   . HTN (hypertension)   . Hypercholesteremia   . A-fib ([place])   . GERD (gastroesophageal reflux disease)   . GERD 02/06/2010  . DIVERTICULOSIS-COLON 02/06/2010  . PERSONAL HX COLONIC POLYPS 02/06/2010  . MASTECTOMY, HX OF 02/06/2010    Kashena Novitski W. 04/23/2016, 12:20 PM  Frazier Butt., PT  [facility] [address] [place], [place], [address] Phone: [phone]   Fax:  [phone]  Name: [name] MRN: [id number] Date of Birth: 19-Oct-1941

## 2016-04-23 NOTE — Progress Notes (Signed)
[name] was seen today in the movement disorders clinic for neurologic consultation at the request of Dr Tomi Likens for a 2nd opinion regarding gait instability.  She is accompanied by her husband who supplements the hx.  The records that were made available to me were reviewed.  Pt reports that she has been having falls, the first of which was several years ago but then she began to have more regular falls 6-8 months ago.  Most falls are when she turns and she notes that the feet will shuffle and the "body will turn and the feet will stay."  Her husband states that her feet will stay like "they are stuck on bumble gum."  No falls in last 2 weeks.  She has been in rehab recently.    Specific Symptoms:  Tremor: No. Family hx of similar:  No. Voice: yes, voice is softer Sleep: trouble getting to sleep  Vivid Dreams:  No.  Acting out dreams:  No. Wet Pillows: No. Postural symptoms:  Yes.    Falls?  Yes.   Bradykinesia symptoms: shuffling gait, slow movements, slow and small handwriting, difficulty getting out of a chair and difficulty regaining balance Loss of smell:  No. Loss of taste:  No. Urinary Incontinence:  No. (had a bladder suspension surgery and reports been on medication for long time and not changed for long time) Difficulty Swallowing:  Yes.   (pill dysphagia) Handwriting, micrographia: Yes.   (right hand dominant) Trouble with ADL's:  No.  Trouble buttoning clothing: Yes.   Depression:  No. Memory changes:  No. Hallucinations:  No.  visual distortions: Yes.   (only really floaters) N/V:  No. Lightheaded:  Yes.    Syncope: No. Diplopia:  Yes.   (horizontal; monocular; is rare) Dyskinesia:  No.   Reports that she had hemifacial spasm years ago and received botox and then she fell and it got a bit better and she hasn't had botox for years.  She saw Dr. Gilford Rile at [place] those records. Had seen him at least since 1999.  Neuroimaging has previously been  performed.  It is available for my review today and was done on 01/24/16.  There was mild-mod small vessel disease but no significant disease in the basal ganglia.    PREVIOUS MEDICATIONS: none to date  ALLERGIES:   Allergies  Allergen Reactions  . Demerol Shortness Of Breath and Swelling  . Meperidine Hcl Shortness Of Breath and Swelling  . Ivp Dye [Iodinated Diagnostic Agents] Hives    OK with benadryl  . Oxycodone     Hallucinations     CURRENT MEDICATIONS:  Outpatient Encounter Prescriptions as of 04/23/2016  Medication Sig  . Ascorbic Acid (VITAMIN C) 1000 MG tablet Take 1,000 mg by mouth 3 (three) times daily.    . calcium citrate-vitamin D (CITRACAL+D) 315-200 MG-UNIT per tablet Take 1 tablet by mouth 3 (three) times daily.  . Cholecalciferol (VITAMIN D3) 1000 UNITS tablet Take 2,000 Units by mouth daily.   . famotidine-calcium carbonate-magnesium hydroxide (PEPCID COMPLETE) 10-800-165 MG CHEW chewable tablet Chew 1 tablet by mouth daily as needed (heartburn).  . metoprolol succinate (TOPROL XL) 25 MG 24 hr tablet Take 1 tablet (25 mg total) by mouth daily.  . Multiple Vitamin (MULTIVITAMIN) tablet Take 1 tablet by mouth daily at 12 noon.   . Multiple Vitamins-Minerals (VISION-VITE PRESERVE PO) Take 1 tablet by mouth 2 (two) times daily.  Marland Kitchen oxybutynin (DITROPAN) 5 MG tablet Take 5 mg by  mouth daily.  . pantoprazole (PROTONIX) 40 MG tablet Take 40 mg by mouth daily.   . simvastatin (ZOCOR) 20 MG tablet Take 20 mg by mouth every evening.  . triamterene-hydrochlorothiazide (MAXZIDE-25) 37.5-25 MG per tablet Take 1 tablet by mouth daily.  Marland Kitchen UNABLE TO FIND Take 900 mg by mouth daily. Omega Red  . vitamin B-12 (CYANOCOBALAMIN) 1000 MCG tablet Take 1,000 mcg by mouth every morning.  . warfarin (COUMADIN) 5 MG tablet Take 5 mg by mouth every evening. Patient also increases dosage to 7.5mg  1-2 days/week   No facility-administered encounter medications on file as of 04/23/2016.    PAST  MEDICAL HISTORY:   Past Medical History  Diagnosis Date  . Aortic stenosis, severe   . HTN (hypertension)   . Hypercholesteremia   . PVC's (premature ventricular contractions)   . A-fib ([place])   . Venous varices    . GERD (gastroesophageal reflux disease)   . Breast CA ([place]) 2004  . Chest pain   . Herpes zoster infection     History of herpes zoster infection  . Hypercholesterolemia   . Obesity   . History of kidney stones   . History of bronchitis   . Mucositis     Severe mucositis  . Irritable bowel syndrome   . History of hiatal hernia   . PONV (postoperative nausea and vomiting)   . Hemifacial spasm     PAST SURGICAL HISTORY:   Past Surgical History  Procedure Laterality Date  . Aortic valve replacement  04/13/06    St. Jude mechanical conduit  . Total abdominal hysterectomy    . Cystocele repair    . Rectocele repair    . Appendectomy    . Kidney stone surgery    . Foot surgery    . Cataract surgery      bilateral  . Esophageal manometry N/A 08/12/2014    Procedure: ESOPHAGEAL MANOMETRY (EM);  Surgeon: [name], MD;  Location: [facility] ENDOSCOPY;  Service: Endoscopy;  Laterality: N/A;  . Esophagogastroduodenoscopy N/A 10/03/2014    Procedure: ESOPHAGOGASTRODUODENOSCOPY (EGD);  Surgeon: [name], MD;  Location[facility] ENDOSCOPY;  Service: Endoscopy;  Laterality: N/A;  . Colonoscopy N/A 10/03/2014    Procedure: COLONOSCOPY;  Surgeon: [name], MD;  Location: [facility];  Service: Endoscopy;  Laterality: N/A;    SOCIAL HISTORY:   Social History   Social History  . Marital Status: Married    Spouse Name: N/A  . Number of Children: 3  . Years of Education: N/A   Occupational History  . bakery/caterer Other    retired   Social History Main Topics  . Smoking status: Never Smoker   . Smokeless tobacco: Never Used  . Alcohol Use: No  . Drug Use: No  . Sexual Activity: Not on file   Other Topics Concern  . Not on file   Social History Narrative    10th grade education    FAMILY HISTORY:   Family Status  Relation Status Death Age  . Mother Deceased 6  . Father Deceased 84  . Brother Deceased     colon cancer  . Brother Deceased     accident  . Brother Deceased     accident  . Brother Alive     colon cancer  . Brother Other   . Sister Alive   . Sister Alive   . Sister Alive   . Maternal Grandmother Deceased   . Maternal Grandfather Deceased   .  Paternal Grandmother Deceased   . Paternal Grandfather Deceased     ROS:  A complete 10 system review of systems was obtained and was unremarkable apart from what is mentioned above.  PHYSICAL EXAMINATION:    VITALS:   Filed Vitals:   04/23/16 0945  BP: 118/68  Pulse: 60  Height: 4' 11.75" (1.518 m)  Weight: 141 lb (63.957 kg)    GEN:  The patient appears stated age and is in NAD. HEENT:  Normocephalic, atraumatic.  The mucous membranes are moist. The superficial temporal arteries are without ropiness or tenderness. CV:  RRR with 3/6 SEM Lungs:  CTAB Neck/HEME:  There are no carotid bruits bilaterally.  Neurological examination:  Orientation: The patient is alert and oriented x3. Fund of knowledge is appropriate.  Recent and remote memory are intact.  Attention and concentration are normal.    Able to name objects and repeat phrases. Cranial nerves: There is good facial symmetry.  There is facial hypomimia.   There is L hemifacial spasm.  Pupils are equal round and reactive to light bilaterally. Fundoscopic exam reveals clear margins bilaterally. Extraocular muscles are intact. No square wave jerks.  The visual fields are full to confrontational testing. The speech is fluent and clear. She is hypophonic.  Minimal trouble with gutteral sounds.  Soft palate rises symmetrically and there is no tongue deviation. Hearing is intact to conversational tone. Sensation: Sensation is intact to light and pinprick throughout (facial, trunk, extremities). Vibration is decreased at the  bilateral big toe. There is no extinction with double simultaneous stimulation. There is no sensory dermatomal level identified. Motor: Strength is 5/5 in the bilateral upper and lower extremities.   Shoulder shrug is equal and symmetric.  There is no pronator drift. Deep tendon reflexes: Deep tendon reflexes are 2/4 at the bilateral biceps, triceps, brachioradialis, patella and achilles. Plantar responses are downgoing bilaterally.  Movement examination: Tone: There is normal tone in the bilateral upper extremities.  The tone in the lower extremities is normal.  Abnormal movements: none even with distraction Coordination:  There is decremation with RAM's, seen mostly with finger taps on the L but also noted with heel taps/toe taps Gait and Station: The patient has mild difficulty arising out of a deep-seated chair without the use of the hands. The patient's stride length is decreased with marked decreased arm swing on the right.  She turns en bloc.  The patient has a negative pull test.      ASSESSMENT/PLAN:  1.  Parkinsonism  -I do not think that the patient has idiopathic Parkinsons Disease but rather one of the atypical parkinsonian states, and possibly Progressive Supranuclear Palsy (PSP).  We talked about nature, etiology and pathophysiology. We talked about how the symptoms, course and prognosis differ from Parkinson's Disease.  We talked about the risks, particularly for falls and aspiration.   -She is in PT/OT now  -The patient will have a MBE   -We will initiate carbidopa/levodopa 25/100 and slowly work up to 1 tablet three times a day.  This likely won't be enough medication but this is what we will start with for now and we will see how she does.  -given information on PSP symposium but told her that I'm not under percent sure this is what she has and time will tell.  I initially thought that she would have vascular parkinsonism, but I am less convinced of that after I saw her today. 2.   The patient and her husband very  much liked after Tomi Likens, but wanted to follow-up with me given that this is my area of specialty.  I talked to Dr. Tomi Likens about her and he was okay with this.  Much greater than 50% of this 75 minute visit was spent in counseling with the patient and her husband.

## 2016-04-23 NOTE — Patient Instructions (Signed)
Provided patient with PWR! Moves handouts for standing position (modified to be holding on to counter or chair): -PWR! Up x 10 -PWR! Rock x 10 (with 1 UE support at all times) -PWR! Step to side (with 1 UE support at all times)  Forward step x 10 (with 1 UE support at all times -no coordinated arms yet)  To be performed once daily

## 2016-04-27 ENCOUNTER — Ambulatory Visit: Payer: PPO | Attending: Neurology | Admitting: Rehabilitative and Restorative Service Providers"

## 2016-04-27 DIAGNOSIS — R41842 Visuospatial deficit: Secondary | ICD-10-CM | POA: Diagnosis not present

## 2016-04-27 DIAGNOSIS — R278 Other lack of coordination: Secondary | ICD-10-CM | POA: Insufficient documentation

## 2016-04-27 DIAGNOSIS — R2689 Other abnormalities of gait and mobility: Secondary | ICD-10-CM

## 2016-04-27 DIAGNOSIS — Z9181 History of falling: Secondary | ICD-10-CM

## 2016-04-27 DIAGNOSIS — R29818 Other symptoms and signs involving the nervous system: Secondary | ICD-10-CM | POA: Diagnosis not present

## 2016-04-27 DIAGNOSIS — R2681 Unsteadiness on feet: Secondary | ICD-10-CM | POA: Diagnosis not present

## 2016-04-27 NOTE — Therapy (Signed)
[facility] [address] [place], [place], [address] Phone: [phone]   Fax:  [phone]  Physical Therapy Treatment  Patient Details  Name: [name] MRN: [id number] Date of Birth: 1941-12-06 Referring Provider: Metta Clines, MD  Encounter Date: 04/27/2016      PT End of Session - 04/27/16 0958    Visit Number 6   Number of Visits 16   Date for PT Re-Evaluation 05/28/16   Authorization Type G code required every 10th visit   PT Start Time 0801   PT Stop Time 0848   PT Time Calculation (min) 47 min   Equipment Utilized During Treatment Gait belt   Activity Tolerance Patient tolerated treatment well   Behavior During Therapy [facility] for tasks assessed/performed      Past Medical History  Diagnosis Date  . Aortic stenosis, severe   . HTN (hypertension)   . Hypercholesteremia   . PVC's (premature ventricular contractions)   . A-fib (HCC)     paroxysmal  . Venous varices    . GERD (gastroesophageal reflux disease)   . Breast CA ([place]) 2004  . Chest pain   . Herpes zoster infection     History of herpes zoster infection  . Hypercholesterolemia   . Obesity   . History of kidney stones   . History of bronchitis   . Mucositis     Severe mucositis  . Irritable bowel syndrome   . History of hiatal hernia   . PONV (postoperative nausea and vomiting)   . Hemifacial spasm     Past Surgical History  Procedure Laterality Date  . Aortic valve replacement  04/13/06    St. Jude mechanical conduit  . Total abdominal hysterectomy    . Cystocele repair    . Rectocele repair    . Appendectomy    . Kidney stone surgery    . Foot surgery    . Cataract surgery      bilateral  . Esophageal manometry N/A 08/12/2014    Procedure: ESOPHAGEAL MANOMETRY (EM);  Surgeon: [name], MD;  Location: [facility];  Service: Endoscopy;  Laterality: N/A;  . Esophagogastroduodenoscopy N/A 10/03/2014    Procedure:  ESOPHAGOGASTRODUODENOSCOPY (EGD);  Surgeon: [name], MD;  Location: [facility] ENDOSCOPY;  Service: Endoscopy;  Laterality: N/A;  . Colonoscopy N/A 10/03/2014    Procedure: COLONOSCOPY;  Surgeon: [name], MD;  Location: [place];  Service: Endoscopy;  Laterality: N/A;    There were no vitals filed for this visit.      Subjective Assessment - 04/27/16 0807    Subjective The patient tried HEP and had difficulty with standing lateral clapping.  She also continues with neck pain.   "Since I've been using the cane, I haven't fallen".    Pertinent History h/o L ear pain 2 months ago with dizziness.     Patient Stated Goals Improve balance.     Currently in Pain? Yes   Pain Score 5    Pain Location Neck   Pain Orientation Left   Pain Descriptors / Indicators Aching   Pain Type Chronic pain   Pain Onset More than a month ago   Pain Frequency Intermittent   Aggravating Factors  turning head to the left   Pain Relieving Factors head stationery            [facility] PT Assessment - 04/27/16 0819    Ambulation/Gait   Ambulation/Gait Yes   Gait velocity 2.83 ft/sec  Berg Balance Test   Sit to Stand Able to stand without using hands and stabilize independently   Standing Unsupported Able to stand safely 2 minutes   Sitting with Back Unsupported but Feet Supported on Floor or Stool Able to sit safely and securely 2 minutes   Stand to Sit Sits safely with minimal use of hands   Transfers Able to transfer safely, minor use of hands   Standing Unsupported with Eyes Closed Able to stand 10 seconds with supervision   Standing Ubsupported with Feet Together Able to place feet together independently and stand for 1 minute with supervision   From Standing, Reach Forward with Outstretched Arm Can reach confidently >25 cm (10")   From Standing Position, Pick up Object from Floor Able to pick up shoe safely and easily   From Standing Position, Turn to Look Behind Over each Shoulder Looks behind  one side only/other side shows less weight shift   Turn 360 Degrees Able to turn 360 degrees safely but slowly   Standing Unsupported, Alternately Place Feet on Step/Stool Able to complete >2 steps/needs minimal assist   Standing Unsupported, One Foot in Front Able to plae foot ahead of the other independently and hold 30 seconds   Standing on One Leg Able to lift leg independently and hold equal to or more than 3 seconds   Total Score 45   Berg comment: 45/56 improved from 35/56 at eval.                     [facility] Adult PT Treatment/Exercise - 04/27/16 0108    Ambulation/Gait   Gait Comments The patient performed indoor walking with metronome for pacing--had difficulty responding to auditory cues and made sequencing too challenging.  Performed outdoor ambulation negotiating a curb with SPC and supervision and level paved/parking lot surfaces with supervision with cues for deliberate heel strike and correct sequencing of cane.    Therapeutic Activites    Therapeutic Activities Other Therapeutic Activities   Other Therapeutic Activities Floor<>stand activities with support surface and walking hands up legs without support surface.            PWR [facility]) - 04/27/16 0830    PWR! Up x 10 reps   PWR! Rock x 10 reps   PWR! Twist x 10 reps   PWR Step x 10 reps   Comments also performed deliberate cane with L LE movement near countertop stepping into/out of stride position working on pacing.               PT Short Term Goals - 04/27/16 0815    PT SHORT TERM GOAL #1   Title The patient will be able to perform HEP with assist from her husband.   Baseline Reviewed with verbal cues on 04/27/2016   Time 4   Period Weeks   Status Achieved   PT SHORT TERM GOAL #2   Title The patient will improve gait speed from 2.45 ft/sec to > or equal to 2.7 ft/sec to demo transition to "full community ambulator" classification of gait.   Baseline improved to 2.83 ft/sec on 04/27/2016   Time  4   Period Weeks   Status Achieved   PT SHORT TERM GOAL #3   Title The patient will improve Berg score from 35/56 to > or equal to 40/56 to demo decreased fall risk.   Baseline Met on 04/27/2016   Time 4   Period Weeks   Status Achieved   PT  SHORT TERM GOAL #4   Title The patient will perform sit<>stand without UE support x 5 repetitions from chair without assistance.   Baseline Met on 04/20/2016   Time 4   Period Weeks   Status Achieved   PT SHORT TERM GOAL #5   Title The patient will move floor<>stand with UE support modified indep.   Baseline Met on 04/27/2016   Time 4   Period Weeks   Status Achieved           PT Long Term Goals - 04/27/16 1000    PT LONG TERM GOAL #1   Title The patient will improve activities of balance confidence score from 41.9% to > or equal to 55%.   Baseline Target date: 05/29/2016   Time 8   Period Weeks   PT LONG TERM GOAL #2   Title The patient will improve Berg from 35/56 to > or equal to 42/56 to demo decreasing risk for falls.   Baseline Improved to 45/56.   Time 8   Period Weeks   Status Achieved   PT LONG TERM GOAL #3   Title The patient will reduce TUG score from 16.66 seconds to < or equal to 13.5 seconds to demo decreasing risk for falls.   Baseline Target date: 05/29/2016   Time 8   Period Weeks   PT LONG TERM GOAL #4   Title The patient will negotiate indoor surfaces without a devide independently evidenced by no loss of balance on level surfaces x 300 feet.   Baseline Target date: 05/29/2016   Time 8   Period Weeks   PT LONG TERM GOAL #5   Title The patient will move sit<>stand x 5 reps in < or equal to 10 seconds (reduced from 12.53 at baseline).   Baseline Target date: 05/29/2016   Time 8   Period Weeks               Plan - 04/27/16 1000    Clinical Impression Statement The patient has met all STGs.  She is continuing to make progress with PT interventions and we will continue to LTGs.  Patient would also benefit from OT  referral- to request via EPIC.    PT Treatment/Interventions ADLs/Self Care Home Management;Neuromuscular re-education;Balance training;Vestibular;Therapeutic exercise;Therapeutic activities;Functional mobility training;Stair training;Gait training;Patient/family education   PT Next Visit Plan review HEP, deliberate pacing of movement, gait training with cues   Consulted and Agree with Plan of Care Patient      Patient will benefit from skilled therapeutic intervention in order to improve the following deficits and impairments:  Abnormal gait, Decreased activity tolerance, Decreased balance, Decreased mobility, Decreased strength, Pain, Difficulty walking, Dizziness  Visit Diagnosis: Other abnormalities of gait and mobility  History of falling     Problem List Patient Active Problem List   Diagnosis Date Noted  . Family history of colon cancer 07/30/2014  . Hiatal hernia 07/30/2014  . Chest pain 10/13/2011  . Disequilibrium 10/13/2011  . Heart valve replaced by other means 04/16/2011  . Anticoagulant long-term use 03/17/2011  . S/P AVR (aortic valve replacement)   . PVC's (premature ventricular contractions)   . HTN (hypertension)   . Hypercholesteremia   . A-fib (HCC)   . GERD (gastroesophageal reflux disease)   . GERD 02/06/2010  . DIVERTICULOSIS-COLON 02/06/2010  . PERSONAL HX COLONIC POLYPS 02/06/2010  . MASTECTOMY, HX OF 02/06/2010    Arrick Dutton, PT 04/27/2016, 10:04 AM  [facility] [address]  [place], [place], [address] Phone: [phone]   Fax:  [phone]  Name: [name] MRN: [id number] Date of Birth: 1941/01/14

## 2016-04-28 ENCOUNTER — Other Ambulatory Visit: Payer: Self-pay

## 2016-04-28 DIAGNOSIS — Z1231 Encounter for screening mammogram for malignant neoplasm of breast: Secondary | ICD-10-CM

## 2016-04-29 ENCOUNTER — Ambulatory Visit: Payer: PPO | Admitting: Rehabilitative and Restorative Service Providers"

## 2016-04-29 DIAGNOSIS — R2689 Other abnormalities of gait and mobility: Secondary | ICD-10-CM

## 2016-04-29 DIAGNOSIS — Z9181 History of falling: Secondary | ICD-10-CM

## 2016-04-29 NOTE — Therapy (Signed)
[facility] [address] [place], [place], [address] Phone: [phone]   Fax:  [phone]  Physical Therapy Treatment  Patient Details  Name: [name] MRN: [id number] Date of Birth: 01-17-1941 Referring Provider: Metta Clines, MD  Encounter Date: 04/29/2016      PT End of Session - 04/29/16 0929    Visit Number 7   Number of Visits 16   Date for PT Re-Evaluation 05/28/16   Authorization Type G code required every 10th visit   PT Start Time 0848   PT Stop Time 0928   PT Time Calculation (min) 40 min   Equipment Utilized During Treatment Gait belt   Activity Tolerance Patient tolerated treatment well   Behavior During Therapy [facility] for tasks assessed/performed      Past Medical History  Diagnosis Date  . Aortic stenosis, severe   . HTN (hypertension)   . Hypercholesteremia   . PVC's (premature ventricular contractions)   . A-fib (HCC)     paroxysmal  . Venous varices    . GERD (gastroesophageal reflux disease)   . Breast CA ([place]) 2004  . Chest pain   . Herpes zoster infection     History of herpes zoster infection  . Hypercholesterolemia   . Obesity   . History of kidney stones   . History of bronchitis   . Mucositis     Severe mucositis  . Irritable bowel syndrome   . History of hiatal hernia   . PONV (postoperative nausea and vomiting)   . Hemifacial spasm     Past Surgical History  Procedure Laterality Date  . Aortic valve replacement  04/13/06    St. Jude mechanical conduit  . Total abdominal hysterectomy    . Cystocele repair    . Rectocele repair    . Appendectomy    . Kidney stone surgery    . Foot surgery    . Cataract surgery      bilateral  . Esophageal manometry N/A 08/12/2014    Procedure: ESOPHAGEAL MANOMETRY (EM);  Surgeon: [name], MD;  Location: [facility];  Service: Endoscopy;  Laterality: N/A;  . Esophagogastroduodenoscopy N/A 10/03/2014    Procedure:  ESOPHAGOGASTRODUODENOSCOPY (EGD);  Surgeon: [name], MD;  Location: [facility] ENDOSCOPY;  Service: Endoscopy;  Laterality: N/A;  . Colonoscopy N/A 10/03/2014    Procedure: COLONOSCOPY;  Surgeon: [name], MD;  Location: [place];  Service: Endoscopy;  Laterality: N/A;    There were no vitals filed for this visit.      Subjective Assessment - 04/29/16 0851    Subjective The patient registered for class at [facility] for 5/19 on parkinsonism presentation.  "I'm not too sure footed today".  The patient reports more unsteady today.   Patient Stated Goals Improve balance.     Currently in Pain? Yes   Pain Score 4    Pain Location Neck   Pain Orientation Left   Pain Descriptors / Indicators Aching   Pain Type Chronic pain   Pain Onset More than a month ago   Pain Frequency Intermittent   Aggravating Factors  walking, turning head to the left   Pain Relieving Factors head stationery                         [facility] Adult PT Treatment/Exercise - 04/29/16 0930    Ambulation/Gait   Ambulation/Gait Yes   Ambulation/Gait Assistance 5: Supervision   Ambulation  Distance (Feet) 400 Feet   Assistive device Straight cane   Ambulation Surface Level   Gait Comments Gait activities emphasizing deliberate movement of SPC with L LE with verbal cuing for sequencing and timing.     Exercises   Exercises Other Exercises   Other Exercises  seated neck retraction with some discomfort noted x 5 reps, supine P/ROM for stretching levator, suboccipitals, scalenes.     Manual Therapy   Manual Therapy Soft tissue mobilization;Joint mobilization   Joint Mobilization Grade I and II L facet joint mobilization L side   Soft tissue mobilization bilateral scalenes, suboccipitals           PWR [facility]) - 04/29/16 1207    PWR! Up x 10 reps   PWR! Rock x 10 reps   PWR! Twist x 10 reps   PWR Step x 10 reps  in anterior and lateral directions   PWR! Rock and [employer] Used power rocking  during 180 and 360 degree turns working on wider base of support and larger amplitude sway during festination.         LSVT [facility]) - 04/29/16 1207    Step and Reach Backward 10 reps              PT Short Term Goals - 04/27/16 0815    PT SHORT TERM GOAL #1   Title The patient will be able to perform HEP with assist from her husband.   Baseline Reviewed with verbal cues on 04/27/2016   Time 4   Period Weeks   Status Achieved   PT SHORT TERM GOAL #2   Title The patient will improve gait speed from 2.45 ft/sec to > or equal to 2.7 ft/sec to demo transition to "full community ambulator" classification of gait.   Baseline improved to 2.83 ft/sec on 04/27/2016   Time 4   Period Weeks   Status Achieved   PT SHORT TERM GOAL #3   Title The patient will improve Berg score from 35/56 to > or equal to 40/56 to demo decreased fall risk.   Baseline Met on 04/27/2016   Time 4   Period Weeks   Status Achieved   PT SHORT TERM GOAL #4   Title The patient will perform sit<>stand without UE support x 5 repetitions from chair without assistance.   Baseline Met on 04/20/2016   Time 4   Period Weeks   Status Achieved   PT SHORT TERM GOAL #5   Title The patient will move floor<>stand with UE support modified indep.   Baseline Met on 04/27/2016   Time 4   Period Weeks   Status Achieved           PT Long Term Goals - 04/27/16 1000    PT LONG TERM GOAL #1   Title The patient will improve activities of balance confidence score from 41.9% to > or equal to 55%.   Baseline Target date: 05/29/2016   Time 8   Period Weeks   PT LONG TERM GOAL #2   Title The patient will improve Berg from 35/56 to > or equal to 42/56 to demo decreasing risk for falls.   Baseline Improved to 45/56.   Time 8   Period Weeks   Status Achieved   PT LONG TERM GOAL #3   Title The patient will reduce TUG score from 16.66 seconds to < or equal to 13.5 seconds to demo decreasing risk for falls.   Baseline Target date:  05/29/2016  Time 8   Period Weeks   PT LONG TERM GOAL #4   Title The patient will negotiate indoor surfaces without a devide independently evidenced by no loss of balance on level surfaces x 300 feet.   Baseline Target date: 05/29/2016   Time 8   Period Weeks   PT LONG TERM GOAL #5   Title The patient will move sit<>stand x 5 reps in < or equal to 10 seconds (reduced from 12.53 at baseline).   Baseline Target date: 05/29/2016   Time 8   Period Weeks               Plan - 04/29/16 1204    Clinical Impression Statement The patient continues with L sided neck pain and has L muscle twitch in facial muscles near eye.  The patient has increased soreness with passive stretching and soft tissue work.  Encouraged patient to see MD if continues as it has been a chronic problem x months.   PT Treatment/Interventions ADLs/Self Care Home Management;Neuromuscular re-education;Balance training;Vestibular;Therapeutic exercise;Therapeutic activities;Functional mobility training;Stair training;Gait training;Patient/family education   PT Next Visit Plan review HEP, deliberate pacing of movement, gait training with cues   Consulted and Agree with Plan of Care Patient      Patient will benefit from skilled therapeutic intervention in order to improve the following deficits and impairments:  Abnormal gait, Decreased activity tolerance, Decreased balance, Decreased mobility, Decreased strength, Pain, Difficulty walking, Dizziness  Visit Diagnosis: Other abnormalities of gait and mobility  History of falling     Problem List Patient Active Problem List   Diagnosis Date Noted  . Family history of colon cancer 07/30/2014  . Hiatal hernia 07/30/2014  . Chest pain 10/13/2011  . Disequilibrium 10/13/2011  . Heart valve replaced by other means 04/16/2011  . Anticoagulant long-term use 03/17/2011  . S/P AVR (aortic valve replacement)   . PVC's (premature ventricular contractions)   . HTN  (hypertension)   . Hypercholesteremia   . A-fib ([place])   . GERD (gastroesophageal reflux disease)   . GERD 02/06/2010  . DIVERTICULOSIS-COLON 02/06/2010  . PERSONAL HX COLONIC POLYPS 02/06/2010  . MASTECTOMY, HX OF 02/06/2010    Taggert Bozzi, PT 04/29/2016, 12:12 PM  [facility] [address] [place], [place], [address] Phone: [phone]   Fax:  [phone]  Name: [name] MRN: [id number] Date of Birth: 03/03/1941

## 2016-05-03 ENCOUNTER — Telehealth: Payer: Self-pay | Admitting: Rehabilitative and Restorative Service Providers"

## 2016-05-03 ENCOUNTER — Ambulatory Visit: Payer: PPO | Admitting: Rehabilitative and Restorative Service Providers"

## 2016-05-03 ENCOUNTER — Other Ambulatory Visit (HOSPITAL_COMMUNITY): Payer: Self-pay | Admitting: Neurology

## 2016-05-03 DIAGNOSIS — R2689 Other abnormalities of gait and mobility: Secondary | ICD-10-CM

## 2016-05-03 DIAGNOSIS — Z9181 History of falling: Secondary | ICD-10-CM

## 2016-05-03 DIAGNOSIS — R1310 Dysphagia, unspecified: Secondary | ICD-10-CM

## 2016-05-03 DIAGNOSIS — G231 Progressive supranuclear ophthalmoplegia [Steele-Richardson-Olszewski]: Secondary | ICD-10-CM

## 2016-05-03 NOTE — Telephone Encounter (Signed)
Would you place order, [name]?

## 2016-05-03 NOTE — Therapy (Signed)
[facility] [address] [place], [place], [address] Phone: [phone]   Fax:  [phone]  Physical Therapy Treatment  Patient Details  Name: [name] MRN: [id number] Date of Birth: 19-Apr-1941 Referring Provider: Metta Clines, MD  Encounter Date: 05/03/2016      PT End of Session - 05/03/16 0806    Visit Number 8   Number of Visits 16   Date for PT Re-Evaluation 05/28/16   Authorization Type G code required every 10th visit   PT Start Time 0803   PT Stop Time 0846   PT Time Calculation (min) 43 min   Equipment Utilized During Treatment Gait belt   Activity Tolerance Patient tolerated treatment well   Behavior During Therapy [facility] for tasks assessed/performed      Past Medical History  Diagnosis Date  . Aortic stenosis, severe   . HTN (hypertension)   . Hypercholesteremia   . PVC's (premature ventricular contractions)   . A-fib (HCC)     paroxysmal  . Venous varices    . GERD (gastroesophageal reflux disease)   . Breast CA ([place]) 2004  . Chest pain   . Herpes zoster infection     History of herpes zoster infection  . Hypercholesterolemia   . Obesity   . History of kidney stones   . History of bronchitis   . Mucositis     Severe mucositis  . Irritable bowel syndrome   . History of hiatal hernia   . PONV (postoperative nausea and vomiting)   . Hemifacial spasm     Past Surgical History  Procedure Laterality Date  . Aortic valve replacement  04/13/06    St. Jude mechanical conduit  . Total abdominal hysterectomy    . Cystocele repair    . Rectocele repair    . Appendectomy    . Kidney stone surgery    . Foot surgery    . Cataract surgery      bilateral  . Esophageal manometry N/A 08/12/2014    Procedure: ESOPHAGEAL MANOMETRY (EM);  Surgeon: [name], MD;  Location: [facility] ENDOSCOPY;  Service: Endoscopy;  Laterality: N/A;  . Esophagogastroduodenoscopy N/A 10/03/2014    Procedure:  ESOPHAGOGASTRODUODENOSCOPY (EGD);  Surgeon: [name], MD;  Location: [facility] ENDOSCOPY;  Service: Endoscopy;  Laterality: N/A;  . Colonoscopy N/A 10/03/2014    Procedure: COLONOSCOPY;  Surgeon: [name], MD;  Location: [facility];  Service: Endoscopy;  Laterality: N/A;    There were no vitals filed for this visit.      Subjective Assessment - 05/03/16 0804    Subjective The patient reports her neck is stiff and painful with rotation and clicks during walking (with every steo).  She reports the standing twist exercise is unsteady and she loses her balance while performing.   Patient Stated Goals Improve balance.     Currently in Pain? Yes   Pain Score 4    Pain Location Neck   Pain Orientation Left   Pain Descriptors / Indicators Aching   Pain Type Chronic pain   Pain Onset More than a month ago   Pain Frequency Intermittent   Aggravating Factors  walking, turning head to the left   Pain Relieving Factors head stationery            [facility] PT Assessment - 05/03/16 0818    Transfers   Five time sit to stand comments  11.69 seconds with supervision.  [facility] PT Treatment/Exercise - 05/03/16 0818    Ambulation/Gait   Ambulation/Gait Yes   Ambulation/Gait Assistance 5: Supervision   Gait Comments Treadmill x 5 minutes from 1.4-1.8 mph with bilateral UE support and verbal cues and demo cues for longer stride length and heel strike.  Gait with SPC.  Gait activities emphasizing larger amplitude movements and sequencing of SPC.  Performed 300 ft x 3 reps.   Exercises   Exercises Other Exercises   Other Exercises  seated hamstring stretch x 2 reps each side.  Standing door frame scapular retraction x 10 reps, standing chin tucks x 10 reps, seated A/ROM into neck rotation.     Manual Therapy   Manual Therapy Soft tissue mobilization;Manual Traction   Manual therapy comments passive ROM with gentle overpressure   Soft tissue mobilization left scalenes,  suboccipitals   Manual Traction gentle manual traction           [facility]) - 05/03/16 0845    PWR! Twist x 10 reps with cues on hitting middle "T" position   PWR! Step x 10 with cues on large amplitude deliberate steps   PWR! Up with cues on lengthening through spine   Comments R and L alternating UE reaches towards target x 8 reps each side.   PWR! Twist reviewed x 10 reps in standing discussing safe ways to perform at home or near support surface               PT Short Term Goals - 04/27/16 0815    PT SHORT TERM GOAL #1   Title The patient will be able to perform HEP with assist from her husband.   Baseline Reviewed with verbal cues on 04/27/2016   Time 4   Period Weeks   Status Achieved   PT SHORT TERM GOAL #2   Title The patient will improve gait speed from 2.45 ft/sec to > or equal to 2.7 ft/sec to demo transition to "full community ambulator" classification of gait.   Baseline improved to 2.83 ft/sec on 04/27/2016   Time 4   Period Weeks   Status Achieved   PT SHORT TERM GOAL #3   Title The patient will improve Berg score from 35/56 to > or equal to 40/56 to demo decreased fall risk.   Baseline Met on 04/27/2016   Time 4   Period Weeks   Status Achieved   PT SHORT TERM GOAL #4   Title The patient will perform sit<>stand without UE support x 5 repetitions from chair without assistance.   Baseline Met on 04/20/2016   Time 4   Period Weeks   Status Achieved   PT SHORT TERM GOAL #5   Title The patient will move floor<>stand with UE support modified indep.   Baseline Met on 04/27/2016   Time 4   Period Weeks   Status Achieved           PT Long Term Goals - 04/27/16 1000    PT LONG TERM GOAL #1   Title The patient will improve activities of balance confidence score from 41.9% to > or equal to 55%.   Baseline Target date: 05/29/2016   Time 8   Period Weeks   PT LONG TERM GOAL #2   Title The patient will improve Berg from 35/56 to > or equal to 42/56 to demo  decreasing risk for falls.   Baseline Improved to 45/56.   Time 8   Period Weeks   Status Achieved  PT LONG TERM GOAL #3   Title The patient will reduce TUG score from 16.66 seconds to < or equal to 13.5 seconds to demo decreasing risk for falls.   Baseline Target date: 05/29/2016   Time 8   Period Weeks   PT LONG TERM GOAL #4   Title The patient will negotiate indoor surfaces without a devide independently evidenced by no loss of balance on level surfaces x 300 feet.   Baseline Target date: 05/29/2016   Time 8   Period Weeks   PT LONG TERM GOAL #5   Title The patient will move sit<>stand x 5 reps in < or equal to 10 seconds (reduced from 12.53 at baseline).   Baseline Target date: 05/29/2016   Time 8   Period Weeks               Plan - 05/03/16 0850    Clinical Impression Statement The patient cotninues with L sided neck pain.  She has difficulty during transitional movements. PT has requested OT referral in EPIC.   PT Treatment/Interventions ADLs/Self Care Home Management;Neuromuscular re-education;Balance training;Vestibular;Therapeutic exercise;Therapeutic activities;Functional mobility training;Stair training;Gait training;Patient/family education   PT Next Visit Plan Transitional movements, treadmill, turns, gait training   Consulted and Agree with Plan of Care Patient      Patient will benefit from skilled therapeutic intervention in order to improve the following deficits and impairments:  Abnormal gait, Decreased activity tolerance, Decreased balance, Decreased mobility, Decreased strength, Pain, Difficulty walking, Dizziness  Visit Diagnosis: Other abnormalities of gait and mobility  History of falling     Problem List Patient Active Problem List   Diagnosis Date Noted  . Family history of colon cancer 07/30/2014  . Hiatal hernia 07/30/2014  . Chest pain 10/13/2011  . Disequilibrium 10/13/2011  . Heart valve replaced by other means 04/16/2011  .  Anticoagulant long-term use 03/17/2011  . S/P AVR (aortic valve replacement)   . PVC's (premature ventricular contractions)   . HTN (hypertension)   . Hypercholesteremia   . A-fib ([place])   . GERD (gastroesophageal reflux disease)   . GERD 02/06/2010  . DIVERTICULOSIS-COLON 02/06/2010  . PERSONAL HX COLONIC POLYPS 02/06/2010  . MASTECTOMY, HX OF 02/06/2010    Trinna Kunst, PT 05/03/2016, 1:45 PM  [facility] [address] [place], [place], [address] Phone: [phone]   Fax:  [phone]  Name: [name] MRN: [id number] Date of Birth: [birth year]

## 2016-05-03 NOTE — Telephone Encounter (Signed)
Dr. Carles Collet, [name] has been seeing PT x 4 weeks.  She would benefit from an OT order as well.  Please place in epic if you agree. Thank you, Michole Lecuyer, PT

## 2016-05-04 ENCOUNTER — Ambulatory Visit: Payer: PPO | Admitting: Rehabilitative and Restorative Service Providers"

## 2016-05-04 DIAGNOSIS — Z9181 History of falling: Secondary | ICD-10-CM

## 2016-05-04 DIAGNOSIS — R2689 Other abnormalities of gait and mobility: Secondary | ICD-10-CM

## 2016-05-04 NOTE — Telephone Encounter (Signed)
Order placed

## 2016-05-04 NOTE — Therapy (Signed)
[facility] [address] [place], [place], [address] Phone: [phone]   Fax:  [phone]  Physical Therapy Treatment  Patient Details  Name: [name] MRN: [id number] Date of Birth: [birth year] Referring Provider: Metta Clines, MD  Encounter Date: 05/04/2016      PT End of Session - 05/04/16 0839    Visit Number 9   Number of Visits 16   Date for PT Re-Evaluation 05/28/16   Authorization Type G code required every 10th visit   PT Start Time 0801   PT Stop Time 0844   PT Time Calculation (min) 43 min   Equipment Utilized During Treatment Gait belt   Activity Tolerance Patient tolerated treatment well   Behavior During Therapy [facility] for tasks assessed/performed      Past Medical History  Diagnosis Date  . Aortic stenosis, severe   . HTN (hypertension)   . Hypercholesteremia   . PVC's (premature ventricular contractions)   . A-fib (HCC)     paroxysmal  . Venous varices    . GERD (gastroesophageal reflux disease)   . Breast CA ([place]) 2004  . Chest pain   . Herpes zoster infection     History of herpes zoster infection  . Hypercholesterolemia   . Obesity   . History of kidney stones   . History of bronchitis   . Mucositis     Severe mucositis  . Irritable bowel syndrome   . History of hiatal hernia   . PONV (postoperative nausea and vomiting)   . Hemifacial spasm     Past Surgical History  Procedure Laterality Date  . Aortic valve replacement  04/13/06    St. Jude mechanical conduit  . Total abdominal hysterectomy    . Cystocele repair    . Rectocele repair    . Appendectomy    . Kidney stone surgery    . Foot surgery    . Cataract surgery      bilateral  . Esophageal manometry N/A 08/12/2014    Procedure: ESOPHAGEAL MANOMETRY (EM);  Surgeon: [name], MD;  Location: [facility] ENDOSCOPY;  Service: Endoscopy;  Laterality: N/A;  . Esophagogastroduodenoscopy N/A 10/03/2014    Procedure:  ESOPHAGOGASTRODUODENOSCOPY (EGD);  Surgeon: [name], MD;  Location: [facility] ENDOSCOPY;  Service: Endoscopy;  Laterality: N/A;  . Colonoscopy N/A 10/03/2014    Procedure: COLONOSCOPY;  Surgeon: [name], MD;  Location: [facility];  Service: Endoscopy;  Laterality: N/A;    There were no vitals filed for this visit.      Subjective Assessment - 05/04/16 0800    Subjective The patient had increase in neck pain to 9/10 yesterday afternoon (had PT in the morning and did HEP in afternon).  She c/o 6/10 pain this morning.   Patient Stated Goals Improve balance.     Currently in Pain? Yes   Pain Score 6    Pain Location Neck   Pain Orientation Left   Pain Descriptors / Indicators Aching   Pain Type Chronic pain   Pain Onset More than a month ago   Pain Frequency Intermittent   Aggravating Factors  walking, turning head ot the left   Pain Relieving Factors head stationery            [facility] PT Assessment - 05/03/16 0818    Transfers   Five time sit to stand comments  11.69 seconds with supervision.            [facility] Adult  PT Treatment/Exercise - 05/04/16 8921    Ambulation/Gait   Ambulation/Gait Yes   Gait Comments treadmill x 3.5 minutes up to 1.8 mph with bilateral UE support.    Gait without device emphasizing pwr up posture and longer stride length with heel strike.     Neuro Re-ed    Neuro Re-ed Details  Single limb and alternating LE foot taps to cones and stepping over 8" surface with min A.  Performed obstacle course negotiation around cones for figure "8" and turning to sit between chair positioned close together integrating rocking PWR activities to improve performance.  Side stepping and multi-directional changes during gait activities.             [facility]) - 05/04/16 1941    PWR! Twist x 10 reps for PWR supine twist with cues to open into middle "T" position   PWR! Up prone pwr exercises for posture   PWR! Rock prone pwr reaching x 10 reps each side  alternating R and L           PT Short Term Goals - 04/27/16 0815    PT SHORT TERM GOAL #1   Title The patient will be able to perform HEP with assist from her husband.   Baseline Reviewed with verbal cues on 04/27/2016   Time 4   Period Weeks   Status Achieved   PT SHORT TERM GOAL #2   Title The patient will improve gait speed from 2.45 ft/sec to > or equal to 2.7 ft/sec to demo transition to "full community ambulator" classification of gait.   Baseline improved to 2.83 ft/sec on 04/27/2016   Time 4   Period Weeks   Status Achieved   PT SHORT TERM GOAL #3   Title The patient will improve Berg score from 35/56 to > or equal to 40/56 to demo decreased fall risk.   Baseline Met on 04/27/2016   Time 4   Period Weeks   Status Achieved   PT SHORT TERM GOAL #4   Title The patient will perform sit<>stand without UE support x 5 repetitions from chair without assistance.   Baseline Met on 04/20/2016   Time 4   Period Weeks   Status Achieved   PT SHORT TERM GOAL #5   Title The patient will move floor<>stand with UE support modified indep.   Baseline Met on 04/27/2016   Time 4   Period Weeks   Status Achieved           PT Long Term Goals - 04/27/16 1000    PT LONG TERM GOAL #1   Title The patient will improve activities of balance confidence score from 41.9% to > or equal to 55%.   Baseline Target date: 05/29/2016   Time 8   Period Weeks   PT LONG TERM GOAL #2   Title The patient will improve Berg from 35/56 to > or equal to 42/56 to demo decreasing risk for falls.   Baseline Improved to 45/56.   Time 8   Period Weeks   Status Achieved   PT LONG TERM GOAL #3   Title The patient will reduce TUG score from 16.66 seconds to < or equal to 13.5 seconds to demo decreasing risk for falls.   Baseline Target date: 05/29/2016   Time 8   Period Weeks   PT LONG TERM GOAL #4   Title The patient will negotiate indoor surfaces without a devide independently evidenced by no loss of balance on  level surfaces x 300 feet.   Baseline Target date: 05/29/2016   Time 8   Period Weeks   PT LONG TERM GOAL #5   Title The patient will move sit<>stand x 5 reps in < or equal to 10 seconds (reduced from 12.53 at baseline).   Baseline Target date: 05/29/2016   Time 8   Period Weeks               Plan - 05/04/16 0840    Clinical Impression Statement OT order received in epic- patient to schedule.  PT to continue to progress to LTGs.  May recommend further MD assessment of neck as pain increases with stretching activities.    PT Treatment/Interventions ADLs/Self Care Home Management;Neuromuscular re-education;Balance training;Vestibular;Therapeutic exercise;Therapeutic activities;Functional mobility training;Stair training;Gait training;Patient/family education   PT Next Visit Plan G CODE NEXT VISIT*Transitional movements, treadmill, turns, gait training, balance   Consulted and Agree with Plan of Care Patient      Patient will benefit from skilled therapeutic intervention in order to improve the following deficits and impairments:  Abnormal gait, Decreased activity tolerance, Decreased balance, Decreased mobility, Decreased strength, Pain, Difficulty walking, Dizziness  Visit Diagnosis: Other abnormalities of gait and mobility  History of falling     Problem List Patient Active Problem List   Diagnosis Date Noted  . Family history of colon cancer 07/30/2014  . Hiatal hernia 07/30/2014  . Chest pain 10/13/2011  . Disequilibrium 10/13/2011  . Heart valve replaced by other means 04/16/2011  . Anticoagulant long-term use 03/17/2011  . S/P AVR (aortic valve replacement)   . PVC's (premature ventricular contractions)   . HTN (hypertension)   . Hypercholesteremia   . A-fib ([place])   . GERD (gastroesophageal reflux disease)   . GERD 02/06/2010  . DIVERTICULOSIS-COLON 02/06/2010  . PERSONAL HX COLONIC POLYPS 02/06/2010  . MASTECTOMY, HX OF 02/06/2010    Felicita Nuncio,  PT 05/04/2016, 11:40 AM  [facility] [address] [place], [place], [address] Phone: [phone]   Fax:  [phone]  Name: CELISA SCHOENBERG MRN: [id number] Date of Birth: 1941/06/17

## 2016-05-06 ENCOUNTER — Ambulatory Visit: Payer: PPO | Admitting: Rehabilitative and Restorative Service Providers"

## 2016-05-10 DIAGNOSIS — M810 Age-related osteoporosis without current pathological fracture: Secondary | ICD-10-CM | POA: Diagnosis not present

## 2016-05-10 DIAGNOSIS — N390 Urinary tract infection, site not specified: Secondary | ICD-10-CM | POA: Diagnosis not present

## 2016-05-10 DIAGNOSIS — I10 Essential (primary) hypertension: Secondary | ICD-10-CM | POA: Diagnosis not present

## 2016-05-10 DIAGNOSIS — N182 Chronic kidney disease, stage 2 (mild): Secondary | ICD-10-CM | POA: Diagnosis not present

## 2016-05-10 DIAGNOSIS — R8299 Other abnormal findings in urine: Secondary | ICD-10-CM | POA: Diagnosis not present

## 2016-05-11 ENCOUNTER — Ambulatory Visit: Payer: PPO | Admitting: Rehabilitative and Restorative Service Providers"

## 2016-05-11 ENCOUNTER — Ambulatory Visit: Payer: PPO | Admitting: Neurology

## 2016-05-11 DIAGNOSIS — Z9181 History of falling: Secondary | ICD-10-CM

## 2016-05-11 DIAGNOSIS — R2689 Other abnormalities of gait and mobility: Secondary | ICD-10-CM

## 2016-05-11 NOTE — Patient Instructions (Signed)
Fall Prevention in the Home  Falls can cause injuries and can affect people from all age groups. There are many simple things that you can do to make your home safe and to help prevent falls. WHAT CAN I DO ON THE OUTSIDE OF MY HOME?  Regularly repair the edges of walkways and driveways and fix any cracks.  Remove high doorway thresholds.  Trim any shrubbery on the main path into your home.  Use bright outdoor lighting.  Clear walkways of debris and clutter, including tools and rocks.  Regularly check that handrails are securely fastened and in good repair. Both sides of any steps should have handrails.  Install guardrails along the edges of any raised decks or porches.  Have leaves, snow, and ice cleared regularly.  Use sand or salt on walkways during winter months.  In the garage, clean up any spills right away, including grease or oil spills. WHAT CAN I DO IN THE BATHROOM?  Use night lights.  Install grab bars by the toilet and in the tub and shower. Do not use towel bars as grab bars.  Use non-skid mats or decals on the floor of the tub or shower.  If you need to sit down while you are in the shower, use a plastic, non-slip stool..  Keep the floor dry. Immediately clean up any water that spills on the floor.  Remove soap buildup in the tub or shower on a regular basis.  Attach bath mats securely with double-sided non-slip rug tape.  Remove throw rugs and other tripping hazards from the floor. WHAT CAN I DO IN THE BEDROOM?  Use night lights.  Make sure that a bedside light is easy to reach.  Do not use oversized bedding that drapes onto the floor.  Have a firm chair that has side arms to use for getting dressed.  Remove throw rugs and other tripping hazards from the floor. WHAT CAN I DO IN THE KITCHEN?   Clean up any spills right away.  Avoid walking on wet floors.  Place frequently used items in easy-to-reach places.  If you need to reach for something  above you, use a sturdy step stool that has a grab bar.  Keep electrical cables out of the way.  Do not use floor polish or wax that makes floors slippery. If you have to use wax, make sure that it is non-skid floor wax.  Remove throw rugs and other tripping hazards from the floor. WHAT CAN I DO IN THE STAIRWAYS?  Do not leave any items on the stairs.  Make sure that there are handrails on both sides of the stairs. Fix handrails that are broken or loose. Make sure that handrails are as long as the stairways.  Check any carpeting to make sure that it is firmly attached to the stairs. Fix any carpet that is loose or worn.  Avoid having throw rugs at the top or bottom of stairways, or secure the rugs with carpet tape to prevent them from moving.  Make sure that you have a light switch at the top of the stairs and the bottom of the stairs. If you do not have them, have them installed. WHAT ARE SOME OTHER FALL PREVENTION TIPS?  Wear closed-toe shoes that fit well and support your feet. Wear shoes that have rubber soles or low heels.  When you use a stepladder, make sure that it is completely opened and that the sides are firmly locked. Have someone hold the ladder while you   are using it. Do not climb a closed stepladder.  Add color or contrast paint or tape to grab bars and handrails in your home. Place contrasting color strips on the first and last steps.  Use mobility aids as needed, such as canes, walkers, scooters, and crutches.  Turn on lights if it is dark. Replace any light bulbs that burn out.  Set up furniture so that there are clear paths. Keep the furniture in the same spot.  Fix any uneven floor surfaces.  Choose a carpet design that does not hide the edge of steps of a stairway.  Be aware of any and all pets.  Review your medicines with your healthcare provider. Some medicines can cause dizziness or changes in blood pressure, which increase your risk of falling. Talk  with your health care provider about other ways that you can decrease your risk of falls. This may include working with a physical therapist or trainer to improve your strength, balance, and endurance.   This information is not intended to replace advice given to you by your health care provider. Make sure you discuss any questions you have with your health care provider.   Document Released: 12/03/2002 Document Revised: 04/29/2015 Document Reviewed: 01/17/2015 Elsevier Interactive Patient Education 2016 [employer]  

## 2016-05-11 NOTE — Therapy (Signed)
[facility] [address] [place], [place], [address] Phone: [phone]   Fax:  [phone]  Physical Therapy Treatment  Patient Details  Name: [name] MRN: [id number] Date of Birth: Feb 26, 1941 Referring Provider: Metta Clines, MD  Encounter Date: 05/11/2016      PT End of Session - 05/11/16 1238    Visit Number 10   Number of Visits 16   Date for PT Re-Evaluation 05/28/16   Authorization Type G code required every 10th visit   PT Start Time 1233   PT Stop Time 1315   PT Time Calculation (min) 42 min   Equipment Utilized During Treatment Gait belt   Activity Tolerance Patient tolerated treatment well   Behavior During Therapy [facility] for tasks assessed/performed      Past Medical History  Diagnosis Date  . Aortic stenosis, severe   . HTN (hypertension)   . Hypercholesteremia   . PVC's (premature ventricular contractions)   . A-fib (HCC)     paroxysmal  . Venous varices    . GERD (gastroesophageal reflux disease)   . Breast CA ([place]) 2004  . Chest pain   . Herpes zoster infection     History of herpes zoster infection  . Hypercholesterolemia   . Obesity   . History of kidney stones   . History of bronchitis   . Mucositis     Severe mucositis  . Irritable bowel syndrome   . History of hiatal hernia   . PONV (postoperative nausea and vomiting)   . Hemifacial spasm     Past Surgical History  Procedure Laterality Date  . Aortic valve replacement  04/13/06    St. Jude mechanical conduit  . Total abdominal hysterectomy    . Cystocele repair    . Rectocele repair    . Appendectomy    . Kidney stone surgery    . Foot surgery    . Cataract surgery      bilateral  . Esophageal manometry N/A 08/12/2014    Procedure: ESOPHAGEAL MANOMETRY (EM);  Surgeon: [name], MD;  Location: [facility] ENDOSCOPY;  Service: Endoscopy;  Laterality: N/A;  . Esophagogastroduodenoscopy N/A 10/03/2014    Procedure:  ESOPHAGOGASTRODUODENOSCOPY (EGD);  Surgeon: [name], MD;  Location: [facility] ENDOSCOPY;  Service: Endoscopy;  Laterality: N/A;  . Colonoscopy N/A 10/03/2014    Procedure: COLONOSCOPY;  Surgeon: [name], MD;  Location: [facility];  Service: Endoscopy;  Laterality: N/A;    There were no vitals filed for this visit.      Subjective Assessment - 05/11/16 1234    Subjective The patient reports she has a headache today. She reports she had a fall yesterday (tripped over her cane when reaching for purse).   She reports that if she gets up quickly she continues with dizziness.    Pertinent History h/o L ear pain 2 months ago with dizziness.     Patient Stated Goals Improve balance.     Currently in Pain? Yes   Pain Score 5    Pain Location Neck   Pain Orientation Right   Pain Descriptors / Indicators Aching   Pain Type Chronic pain   Pain Onset More than a month ago   Pain Frequency Intermittent   Aggravating Factors  walking, head turn to the left   Pain Relieving Factors head stationery       SELF CARE/HOME MANAGEMENT: Home safety and fall prevention checklist, discussed recent fall and how to avoid  Gait: Outdoor ambulation on level, paved surfaces with SPC and verbal cues for deliberate bilateral heel strike, sequencing of SPC use with L LE, and cues on negotiating changes in inclines.  Patient has festination when turning outdoors and requires CGA.  Indoor ambulation with SPC 345 feet x 2 reps modified indep without loss of balance on level surfaces Indoor ambulation without device without loss of balance today x 345 feet x 1 rep with cues on longer stride length  TUG=11.65 seconds   NEUROMUSCULAR RE-EDUCATION: Five times sit<>stand x 9.50 seconds Standing balance activities negotiating cones, "figure 8" pattern with SBA, 360 degree turns around obstacles Stepping laterally over obstacles with min A x 10 reps R and L sides Stepping anterior/posterior with min A x 10  reps R and L sides for obstacle negotiation  Direction changes with 360 degree turns.        PT Education - 05/11/16 1319    Education provided Yes   Education Details fall prevention in the home   Person(s) Educated Patient   Methods Explanation   Comprehension Verbalized understanding          PT Short Term Goals - 04/27/16 0815    PT SHORT TERM GOAL #1   Title The patient will be able to perform HEP with assist from her husband.   Baseline Reviewed with verbal cues on 04/27/2016   Time 4   Period Weeks   Status Achieved   PT SHORT TERM GOAL #2   Title The patient will improve gait speed from 2.45 ft/sec to > or equal to 2.7 ft/sec to demo transition to "full community ambulator" classification of gait.   Baseline improved to 2.83 ft/sec on 04/27/2016   Time 4   Period Weeks   Status Achieved   PT SHORT TERM GOAL #3   Title The patient will improve Berg score from 35/56 to > or equal to 40/56 to demo decreased fall risk.   Baseline Met on 04/27/2016   Time 4   Period Weeks   Status Achieved   PT SHORT TERM GOAL #4   Title The patient will perform sit<>stand without UE support x 5 repetitions from chair without assistance.   Baseline Met on 04/20/2016   Time 4   Period Weeks   Status Achieved   PT SHORT TERM GOAL #5   Title The patient will move floor<>stand with UE support modified indep.   Baseline Met on 04/27/2016   Time 4   Period Weeks   Status Achieved           PT Long Term Goals - 05/11/16 1303    PT LONG TERM GOAL #1   Title The patient will improve activities of balance confidence score from 41.9% to > or equal to 55%.   Baseline Target date: 05/29/2016   Time 8   Period Weeks   PT LONG TERM GOAL #2   Title The patient will improve Berg from 35/56 to > or equal to 42/56 to demo decreasing risk for falls.   Baseline Improved to 45/56.   Time 8   Period Weeks   Status Achieved   PT LONG TERM GOAL #3   Title The patient will reduce TUG score from  16.66 seconds to < or equal to 13.5 seconds to demo decreasing risk for falls.   Baseline Met on 05/11/2016 scoring 11.65 seconds without a device indep   Time 8   Period Weeks   Status Achieved   PT  LONG TERM GOAL #4   Title The patient will negotiate indoor surfaces without a devide independently evidenced by no loss of balance on level surfaces x 300 feet.   Baseline Target date: 05/29/2016   Time 8   Period Weeks   PT LONG TERM GOAL #5   Title The patient will move sit<>stand x 5 reps in < or equal to 10 seconds (reduced from 12.53 at baseline).   Baseline Met on 05-12-2016 with 9.50 seconds   Time 8   Period Weeks   Status Achieved               Plan - 05-12-2016 1320    Clinical Impression Statement The patient has met 3 LTGs.  Although demonstrating progress with gait activities in PT, she continues to have imbalance and reports a fall yesterday from tripping on an obstacle.  PT educated patient on continued risk for falling and discussed home safety for post d/c.    PT Treatment/Interventions ADLs/Self Care Home Management;Neuromuscular re-education;Balance training;Vestibular;Therapeutic exercise;Therapeutic activities;Functional mobility training;Stair training;Gait training;Patient/family education   PT Next Visit Plan Check HEP, d/c planning, balance, gait training.   Consulted and Agree with Plan of Care Patient      Patient will benefit from skilled therapeutic intervention in order to improve the following deficits and impairments:  Abnormal gait, Decreased activity tolerance, Decreased balance, Decreased mobility, Decreased strength, Pain, Difficulty walking, Dizziness  Visit Diagnosis: Other abnormalities of gait and mobility  History of falling       G-Codes - May 12, 2016 1249    Functional Assessment Tool Used Berg=45/56   Functional Limitation Mobility: Walking and moving around   Mobility: Walking and Moving Around Current Status [phone]) At least 20 percent  but less than 40 percent impaired, limited or restricted   Mobility: Walking and Moving Around Goal Status [phone]) At least 20 percent but less than 40 percent impaired, limited or restricted      Problem List Patient Active Problem List   Diagnosis Date Noted  . Family history of colon cancer 07/30/2014  . Hiatal hernia 07/30/2014  . Chest pain 10/13/2011  . Disequilibrium 10/13/2011  . Heart valve replaced by other means 04/16/2011  . Anticoagulant long-term use 03/17/2011  . S/P AVR (aortic valve replacement)   . PVC's (premature ventricular contractions)   . HTN (hypertension)   . Hypercholesteremia   . A-fib ([place])   . GERD (gastroesophageal reflux disease)   . GERD 02/06/2010  . DIVERTICULOSIS-COLON 02/06/2010  . PERSONAL HX COLONIC POLYPS 02/06/2010  . MASTECTOMY, HX OF 02/06/2010    Gricel Copen, PT 2016/05/12, 1:21 PM  [facility] [address] [place], [place], [address] Phone: [phone]   Fax:  [phone]  Name: LESSLIE MOSSA MRN: [id number] Date of Birth: [birth year]

## 2016-05-13 ENCOUNTER — Ambulatory Visit: Payer: PPO | Admitting: Occupational Therapy

## 2016-05-13 ENCOUNTER — Ambulatory Visit: Payer: PPO | Admitting: Rehabilitative and Restorative Service Providers"

## 2016-05-13 DIAGNOSIS — R278 Other lack of coordination: Secondary | ICD-10-CM

## 2016-05-13 DIAGNOSIS — R2689 Other abnormalities of gait and mobility: Secondary | ICD-10-CM

## 2016-05-13 DIAGNOSIS — R2681 Unsteadiness on feet: Secondary | ICD-10-CM

## 2016-05-13 DIAGNOSIS — R29818 Other symptoms and signs involving the nervous system: Secondary | ICD-10-CM

## 2016-05-13 DIAGNOSIS — Z9181 History of falling: Secondary | ICD-10-CM

## 2016-05-13 DIAGNOSIS — R41842 Visuospatial deficit: Secondary | ICD-10-CM

## 2016-05-13 NOTE — Therapy (Signed)
[facility] [address] [place], [place], [address] Phone: [phone]   Fax:  [phone]  Physical Therapy Treatment  Patient Details  Name: [name] MRN: [id number] Date of Birth: 1941-10-30 Referring Provider: Metta Clines, MD  Encounter Date: 05/13/2016      PT End of Session - 05/13/16 0854    Visit Number 11   Number of Visits 16   Date for PT Re-Evaluation 05/28/16   Authorization Type G code required every 10th visit   PT Start Time 0850   PT Stop Time 0930   PT Time Calculation (min) 40 min   Equipment Utilized During Treatment Gait belt   Activity Tolerance Patient tolerated treatment well   Behavior During Therapy [facility] for tasks assessed/performed      Past Medical History  Diagnosis Date  . Aortic stenosis, severe   . HTN (hypertension)   . Hypercholesteremia   . PVC's (premature ventricular contractions)   . A-fib (HCC)     paroxysmal  . Venous varices    . GERD (gastroesophageal reflux disease)   . Breast CA ([place]) 2004  . Chest pain   . Herpes zoster infection     History of herpes zoster infection  . Hypercholesterolemia   . Obesity   . History of kidney stones   . History of bronchitis   . Mucositis     Severe mucositis  . Irritable bowel syndrome   . History of hiatal hernia   . PONV (postoperative nausea and vomiting)   . Hemifacial spasm     Past Surgical History  Procedure Laterality Date  . Aortic valve replacement  04/13/06    St. Jude mechanical conduit  . Total abdominal hysterectomy    . Cystocele repair    . Rectocele repair    . Appendectomy    . Kidney stone surgery    . Foot surgery    . Cataract surgery      bilateral  . Esophageal manometry N/A 08/12/2014    Procedure: ESOPHAGEAL MANOMETRY (EM);  Surgeon: [name], MD;  Location: [facility] ENDOSCOPY;  Service: Endoscopy;  Laterality: N/A;  . Esophagogastroduodenoscopy N/A 10/03/2014    Procedure:  ESOPHAGOGASTRODUODENOSCOPY (EGD);  Surgeon: [name], MD;  Location: [facility] ENDOSCOPY;  Service: Endoscopy;  Laterality: N/A;  . Colonoscopy N/A 10/03/2014    Procedure: COLONOSCOPY;  Surgeon: [name], MD;  Location: [place];  Service: Endoscopy;  Laterality: N/A;    There were no vitals filed for this visit.      Subjective Assessment - 05/13/16 0852    Subjective The patient reports no falls since last session.   Patient Stated Goals Improve balance.     Currently in Pain? Yes   Pain Score 4    Pain Location Neck   Pain Orientation Left   Pain Descriptors / Indicators Dull;Aching   Pain Type Chronic pain   Pain Onset More than a month ago   Pain Frequency Intermittent   Aggravating Factors  turning to the left   Pain Relieving Factors keeping head still      NEUROMUSCULAR RE-EDUCATION: Reviewed PWR exercises for HEP including: PWR lateral step with cues to step right "hold, 1-2-3 and middle, 1-2-3, step left 1-2-3".   PWR rock and reach with hold x 3 counts at each position PWR twist with holding x 3 counts at each position for improved control and more deliberate motion  Seated trunk pwr! Exercises reaching to floor  and return to upright position for posture x 10 reps holding x 3 counts  Standing moving SPC with L LE into anterior to midline motion with cues on large amplitude movement, repeated with cane stationery moving R LE x 10 reps each side.  360 degree turns with PWR training for weight shifting using rock and reach technique and slowed pace right and left with SBA for safety  Gait: Ambulation x 350 feet without a device without loss of balance Ambulation on treadmill emphasizing longer stride length and L heel strike x 4 minutes with bilateral UE support Gait with SPC emphasizing deliberate use of SPC with L LE during swing phase x 350 feet x 2 reps       PT Short Term Goals - 04/27/16 0815    PT SHORT TERM GOAL #1   Title The patient will be  able to perform HEP with assist from her husband.   Baseline Reviewed with verbal cues on 04/27/2016   Time 4   Period Weeks   Status Achieved   PT SHORT TERM GOAL #2   Title The patient will improve gait speed from 2.45 ft/sec to > or equal to 2.7 ft/sec to demo transition to "full community ambulator" classification of gait.   Baseline improved to 2.83 ft/sec on 04/27/2016   Time 4   Period Weeks   Status Achieved   PT SHORT TERM GOAL #3   Title The patient will improve Berg score from 35/56 to > or equal to 40/56 to demo decreased fall risk.   Baseline Met on 04/27/2016   Time 4   Period Weeks   Status Achieved   PT SHORT TERM GOAL #4   Title The patient will perform sit<>stand without UE support x 5 repetitions from chair without assistance.   Baseline Met on 04/20/2016   Time 4   Period Weeks   Status Achieved   PT SHORT TERM GOAL #5   Title The patient will move floor<>stand with UE support modified indep.   Baseline Met on 04/27/2016   Time 4   Period Weeks   Status Achieved           PT Long Term Goals - 05/13/16 7209    PT LONG TERM GOAL #1   Title The patient will improve activities of balance confidence score from 41.9% to > or equal to 55%.   Baseline Target date: 05/29/2016   Time 8   Period Weeks   PT LONG TERM GOAL #2   Title The patient will improve Berg from 35/56 to > or equal to 42/56 to demo decreasing risk for falls.   Baseline Improved to 45/56.   Time 8   Period Weeks   Status Achieved   PT LONG TERM GOAL #3   Title The patient will reduce TUG score from 16.66 seconds to < or equal to 13.5 seconds to demo decreasing risk for falls.   Baseline Met on 05/11/2016 scoring 11.65 seconds without a device indep   Time 8   Period Weeks   Status Achieved   PT LONG TERM GOAL #4   Title The patient will negotiate indoor surfaces without a devide independently evidenced by no loss of balance on level surfaces x 300 feet.   Baseline Met on 05/12/2016 however demo's  inconsistentency with ths activity.   Time 8   Period Weeks   Status Achieved   PT LONG TERM GOAL #5   Title The patient will move sit<>stand x  5 reps in < or equal to 10 seconds (reduced from 12.53 at baseline).   Baseline Met on 05/11/2016 with 9.50 seconds   Time 8   Period Weeks   Status Achieved   Additional Long Term Goals   Additional Long Term Goals Yes   PT LONG TERM GOAL #6   Title Continue to progress HEP for post d/c plan.   Baseline Target date  05/29/2016   Status New               Plan - 05/13/16 1152    Clinical Impression Statement The patient met another LTG, however demonstrates some inconsistency in PT with activities.  Patient responded well during HEP to perform step, then hold 3 seconds and this allows for larger amplitude movement with better balance noted.    PT Treatment/Interventions ADLs/Self Care Home Management;Neuromuscular re-education;Balance training;Vestibular;Therapeutic exercise;Therapeutic activities;Functional mobility training;Stair training;Gait training;Patient/family education   PT Next Visit Plan Check HEP, d/c planning, balance, gait training.   Consulted and Agree with Plan of Care Patient      Patient will benefit from skilled therapeutic intervention in order to improve the following deficits and impairments:  Abnormal gait, Decreased activity tolerance, Decreased balance, Decreased mobility, Decreased strength, Pain, Difficulty walking, Dizziness  Visit Diagnosis: Other abnormalities of gait and mobility  History of falling     Problem List Patient Active Problem List   Diagnosis Date Noted  . Family history of colon cancer 07/30/2014  . Hiatal hernia 07/30/2014  . Chest pain 10/13/2011  . Disequilibrium 10/13/2011  . Heart valve replaced by other means 04/16/2011  . Anticoagulant long-term use 03/17/2011  . S/P AVR (aortic valve replacement)   . PVC's (premature ventricular contractions)   . HTN (hypertension)   .  Hypercholesteremia   . A-fib ([place])   . GERD (gastroesophageal reflux disease)   . GERD 02/06/2010  . DIVERTICULOSIS-COLON 02/06/2010  . PERSONAL HX COLONIC POLYPS 02/06/2010  . MASTECTOMY, HX OF 02/06/2010    Nicole Hafley, PT 05/13/2016, 11:53 AM  [facility] [address] [place], [place], [address] Phone: [phone]   Fax:  [phone]  Name: [name] MRN: [id number] Date of Birth: 1941-07-09

## 2016-05-13 NOTE — Therapy (Signed)
[facility] [address] [place], [place], [address] Phone: [phone]   Fax:  [phone]  Occupational Therapy Evaluation  Patient Details  Name: [name] MRN: [id number] Date of Birth: 30-Jun-1941 Referring Provider: Dr. Wells Guiles Tat  Encounter Date: 05/13/2016      OT End of Session - 05/13/16 1524    Visit Number 1   Number of Visits 17   Date for OT Re-Evaluation 07/12/16   Authorization Type [employer]; no visit limit or auth; G-code   Authorization - Visit Number 1   Authorization - Number of Visits 10   OT Start Time 0805   OT Stop Time 0846   OT Time Calculation (min) 41 min   Activity Tolerance Patient tolerated treatment well   Behavior During Therapy [facility] for tasks assessed/performed      Past Medical History  Diagnosis Date  . Aortic stenosis, severe   . HTN (hypertension)   . Hypercholesteremia   . PVC's (premature ventricular contractions)   . A-fib (HCC)     paroxysmal  . Venous varices    . GERD (gastroesophageal reflux disease)   . Breast CA ([place]) 2004  . Chest pain   . Herpes zoster infection     History of herpes zoster infection  . Hypercholesterolemia   . Obesity   . History of kidney stones   . History of bronchitis   . Mucositis     Severe mucositis  . Irritable bowel syndrome   . History of hiatal hernia   . PONV (postoperative nausea and vomiting)   . Hemifacial spasm     Past Surgical History  Procedure Laterality Date  . Aortic valve replacement  04/13/06    St. Jude mechanical conduit  . Total abdominal hysterectomy    . Cystocele repair    . Rectocele repair    . Appendectomy    . Kidney stone surgery    . Foot surgery    . Cataract surgery      bilateral  . Esophageal manometry N/A 08/12/2014    Procedure: ESOPHAGEAL MANOMETRY (EM);  Surgeon: [name], MD;  Location: [facility];  Service: Endoscopy;  Laterality: N/A;  .  Esophagogastroduodenoscopy N/A 10/03/2014    Procedure: ESOPHAGOGASTRODUODENOSCOPY (EGD);  Surgeon: [name], MD;  Location: [facility] ENDOSCOPY;  Service: Endoscopy;  Laterality: N/A;  . Colonoscopy N/A 10/03/2014    Procedure: COLONOSCOPY;  Surgeon: [name], MD;  Location: [place];  Service: Endoscopy;  Laterality: N/A;    There were no vitals filed for this visit.      Subjective Assessment - 05/13/16 0809    Subjective  Pt reports that Dr. Carles Collet feels like she has Parkinsonism    Pertinent History Newly diagnosed Parkinsonism (? PSP); hx of multiple falls; hx of L breast CA (treated with chemo, radiation, mastectomy/reconstruction 2004); heart valve replacement; PVC's; HTN; macular degeneration   Limitations fall risk   Patient Stated Goals improve writing   Currently in Pain? Yes   Pain Score 4    Pain Location Neck   Pain Orientation Left   Pain Descriptors / Indicators Dull   Pain Type Chronic pain   Pain Onset More than a month ago   Pain Frequency Intermittent  most of the time   Aggravating Factors  turning the head too far   Pain Relieving Factors keeping head still           [facility] OT Assessment - 05/13/16 0001  Assessment   Diagnosis Parkinsonism   Referring Provider Dr. Wells Guiles Tat   Onset Date --  04/15/16--neurologist   Prior Therapy currently in PT   Precautions   Precautions Fall   Balance Screen   Has the patient fallen in the past 6 months Yes   How many times? multiple  one since started using cane   Home  Environment   Family/patient expects to be discharged to: Private residence   Lives With Spouse   Prior Function   Level of Independence Independent   Leisure difficulty decorating cake   ADL   Eating/Feeding --  min spills, difficulty cutting, difficulty opening container   Grooming --  difficulty curling hair   Upper Body Bathing --  incr time   Lower Body Bathing Increased time   Upper Body Dressing --  difficulty with  button, A for fastening bra   Lower Body Dressing --  difficulty getting on shoes   Toilet Tranfer --  min difficulty, easier with higher one   Toileting - Clothing Manipulation Increased time  urinary frequency    Toileting -  Hygiene Modified Independent   Tub/Shower Transfer Modified independent   Transfers/Ambulation Related to ADL's difficulty turning over in bed, incr time with car transfer   IADL   Prior Level of Function Shopping performs with husband   Light Housekeeping --  husband helping more, difficulty reaching low things   Meal Prep --  falls, difficulty turning in [place] own vehicle  but hasn't driven in approx a month   Medication Management Takes responsibility if medication is prepared in advance in seperate dosage   Prior Level of Function Financial Management husband has always performed   Mobility   Mobility Status History of falls   Mobility Status Comments Uses single point cane most of the time now.  Falls with curbs.   Written Expression   Dominant Hand Right   Handwriting --  mod micrographia, 50% legibility cursive, 75% printing   Vision - History   Baseline Vision Wears glasses all the time  transitional lens   Visual History Macular degeneration   Additional Comments had catracts and scar tissue removed, sees spots with R eye most of the time   Vision Assessment   Tracking/Visual Pursuits --  loses target, decr smoothness; difficulty superior/inferior   Comment Macular degeneration   Cognition   Overall Cognitive Status Within Functional Limits for tasks assessed   Observation/Other Assessments   Standing Functional Reach Test R-8", L-8"   Other Surveys  Select   Physical Performance Test   Yes   Simulated Eating Time (seconds) 10.54   Donning Doffing Jacket Time (seconds) 12.34   Donning Doffing Jacket Comments Buttoning/unbuttoning 3 buttons on tabletop in 20.91sec.   Coordination   9 Hole Peg Test Right;Left    Right 9 Hole Peg Test 32.50   Left 9 Hole Peg Test 26.50   Box and Blocks R-44blocks, L-40blocks   Tone   Assessment Location Right Upper Extremity;Left Upper Extremity   ROM / Strength   AROM / PROM / Strength AROM   AROM   Overall AROM  Within functional limits for tasks performed   RUE Tone   RUE Tone Within Functional Limits   LUE Tone   LUE Tone Within Functional Limits                         OT Education - 05/13/16 1515  Education provided Yes   Education Details POC; Parkinsonism is progressive (per pt question) but that specific exercises can help prevent future complications/improve ADL functional use: speech changes with Parkinsonism   Person(s) Educated Patient   Methods Explanation   Comprehension Verbalized understanding          OT Short Term Goals - 05/13/16 1641    OT SHORT TERM GOAL #1   Title Pt will be independent with PD-specific HEP.--check STGs 06/12/16   Time 4   Period Weeks   Status New   OT SHORT TERM GOAL #2   Title Pt will report improved ability to cut food and decr spills when eating.   Time 4   Period Weeks   Status New   OT SHORT TERM GOAL #3   Title Pt will report incr ease with donning shoes and perform in reasonable amount of time.   Time 4   Period Weeks   Status New   OT SHORT TERM GOAL #4   Title Pt will report incr ease with fastening bra and buttons.   Time 4   Period Weeks   Status New   OT SHORT TERM GOAL #5   Title Pt will report incr ease turning in bed using large amplitude movement strategies.   Time 4   Period Weeks   Status New   OT SHORT TERM GOAL #6   Title Pt will write at least 3 sentences with at least 90% legibility and only min decr in size.   Baseline legibility 50% cursive, 75% print     Time 4   Period Weeks   Status New           OT Long Term Goals - 05/13/16 1646    OT LONG TERM GOAL #1   Title Pt will verbalize understanding of adaptive strategies/AE for ADLs/IADLs  prn.--check LTGs 06/12/16   Time 8   Period Weeks   Status New   OT LONG TERM GOAL #2   Title Pt will improve coordination for ADLs as shown by reducing time on 9-hole peg test by at least 5 sec with RUE.   Baseline  32.50   Time 8   Period Weeks   Status New   OT LONG TERM GOAL #3   Title Pt will improve coordination/functional reaching as shown by improving box and blocks test by at least 5 bilaterally.   Baseline R-44, L-40 blocks   Time 8   Period Weeks   Status New   OT LONG TERM GOAL #4   Title Pt will improve functional reaching/balance for IADLs as shown by improving standing functional reach by at least 2" bilaterally.   Baseline 8" bilaterally   Time 8   Period Weeks   Status New   OT LONG TERM GOAL #5   Title Pt will write at least 3 sentences with at least 90% legibility and only min decr in size.   Baseline legibility 50% cursive, 75% print, mod micrographia   Time 8   Period Weeks   Status New   OT LONG TERM GOAL #6   Title Pt will verbalize understanding of ways to prevent future complications and appropriate community resources prn.   Time 8   Period Weeks   Status New               Plan - 05/13/16 1525    Clinical Impression Statement Pt is a 75 y.o. female newly diagnosed with Parkinsonism (suspected PSP).  Pt  with PMH that includes:  hx of multiple falls; hx of L breast CA (treated with chemo, radiation, mastectomy/reconstruction 2004); heart valve replacement; PVC's; HTN; macular degeneration.  Pt presents today with bradykinesia/hypokinesia, decr coordination/timing difficulties, decr functional mobility/balance for ADLs/IADLs, visual deficits (with hx of macular degeneration, cataract surgeries).  Pt would benefit from occupational therapy to establish HEP, prevent future complications, and address deficits in order to improve safety/ease with ADLs/IADLs.     Rehab Potential Good   Clinical Impairments Affecting Rehab Potential hx of falls, possible  PSP   OT Frequency 2x / week   OT Duration 8 weeks  +eval   OT Treatment/Interventions Self-care/ADL training;Therapeutic exercise;Functional Mobility Training;Patient/family education;Balance training;Manual Therapy;Neuromuscular education;Ultrasound;Energy conservation;Therapeutic exercises;Cryotherapy;Parrafin;DME and/or AE instruction;Therapeutic activities;Cognitive remediation/compensation;Fluidtherapy;Electrical Stimulation;Moist Heat;Passive range of motion;Visual/perceptual remediation/compensation   Plan supine PWR!,  HEP (coordination)   Recommended Other Services Pt is currently receiving PT   Consulted and Agree with Plan of Care Patient      Patient will benefit from skilled therapeutic intervention in order to improve the following deficits and impairments:     Visit Diagnosis: Other symptoms and signs involving the nervous system - Plan: Ot plan of care cert/re-cert  Other lack of coordination - Plan: Ot plan of care cert/re-cert  Unsteadiness on feet - Plan: Ot plan of care cert/re-cert  Other abnormalities of gait and mobility - Plan: Ot plan of care cert/re-cert  Visuospatial deficit - Plan: Ot plan of care cert/re-cert      G-Codes - [id number]    Functional Assessment Tool Used 9-hole peg test:  R-32.50sec; box and blocks test:  R-44, L-40 blocks; standing functional reach:  8" bilaterally; writing legibility 50% cursive, 75% print, mod micrographia   Functional Limitation Carrying, moving and handling objects   Carrying, Moving and Handling Objects Current Status [id number]) At least 40 percent but less than 60 percent impaired, limited or restricted   Carrying, Moving and Handling Objects Goal Status [id number]) At least 1 percent but less than 20 percent impaired, limited or restricted      Problem List Patient Active Problem List   Diagnosis Date Noted  . Family history of colon cancer 07/30/2014  . Hiatal hernia 07/30/2014  . Chest pain 10/13/2011  .  Disequilibrium 10/13/2011  . Heart valve replaced by other means 04/16/2011  . Anticoagulant long-term use 03/17/2011  . S/P AVR (aortic valve replacement)   . PVC's (premature ventricular contractions)   . HTN (hypertension)   . Hypercholesteremia   . A-fib ([place])   . GERD (gastroesophageal reflux disease)   . GERD 02/06/2010  . DIVERTICULOSIS-COLON 02/06/2010  . PERSONAL HX COLONIC POLYPS 02/06/2010  . MASTECTOMY, HX OF 02/06/2010    [facility] 05/13/2016, 5:14 PM  [facility] [address] [place], [place], [address] Phone: [phone]   Fax:  [phone]  Name: [name] MRN: [id number] Date of Birth: 04-May-1941  Vianne Bulls, OTR/L [facility] [address] [place], [place]  [address] [phone] phone [phone] 05/13/2016 5:14 PM

## 2016-05-17 ENCOUNTER — Ambulatory Visit
Admission: RE | Admit: 2016-05-17 | Discharge: 2016-05-17 | Disposition: A | Discharge status: Home / Self Care | Payer: PPO | Source: Ambulatory Visit | Attending: Internal Medicine | Admitting: Internal Medicine

## 2016-05-17 ENCOUNTER — Other Ambulatory Visit: Payer: Self-pay | Admitting: Internal Medicine

## 2016-05-17 DIAGNOSIS — I10 Essential (primary) hypertension: Secondary | ICD-10-CM | POA: Diagnosis not present

## 2016-05-17 DIAGNOSIS — M542 Cervicalgia: Secondary | ICD-10-CM

## 2016-05-17 DIAGNOSIS — R2689 Other abnormalities of gait and mobility: Secondary | ICD-10-CM | POA: Diagnosis not present

## 2016-05-17 DIAGNOSIS — M50323 Other cervical disc degeneration at C6-C7 level: Secondary | ICD-10-CM | POA: Diagnosis not present

## 2016-05-17 DIAGNOSIS — Z1389 Encounter for screening for other disorder: Secondary | ICD-10-CM | POA: Diagnosis not present

## 2016-05-17 DIAGNOSIS — Z6828 Body mass index (BMI) 28.0-28.9, adult: Secondary | ICD-10-CM | POA: Diagnosis not present

## 2016-05-17 DIAGNOSIS — M810 Age-related osteoporosis without current pathological fracture: Secondary | ICD-10-CM | POA: Diagnosis not present

## 2016-05-17 DIAGNOSIS — I358 Other nonrheumatic aortic valve disorders: Secondary | ICD-10-CM | POA: Diagnosis not present

## 2016-05-17 DIAGNOSIS — F5104 Psychophysiologic insomnia: Secondary | ICD-10-CM | POA: Diagnosis not present

## 2016-05-17 DIAGNOSIS — R3129 Other microscopic hematuria: Secondary | ICD-10-CM | POA: Diagnosis not present

## 2016-05-17 DIAGNOSIS — M50322 Other cervical disc degeneration at C5-C6 level: Secondary | ICD-10-CM | POA: Diagnosis not present

## 2016-05-17 DIAGNOSIS — Z7901 Long term (current) use of anticoagulants: Secondary | ICD-10-CM | POA: Diagnosis not present

## 2016-05-17 DIAGNOSIS — C50919 Malignant neoplasm of unspecified site of unspecified female breast: Secondary | ICD-10-CM | POA: Diagnosis not present

## 2016-05-17 DIAGNOSIS — Z0000 Encounter for general adult medical examination without abnormal findings: Secondary | ICD-10-CM | POA: Diagnosis not present

## 2016-05-18 ENCOUNTER — Ambulatory Visit: Payer: PPO | Admitting: Rehabilitative and Restorative Service Providers"

## 2016-05-18 ENCOUNTER — Ambulatory Visit: Payer: PPO | Admitting: Occupational Therapy

## 2016-05-18 DIAGNOSIS — R29818 Other symptoms and signs involving the nervous system: Secondary | ICD-10-CM

## 2016-05-18 DIAGNOSIS — R2689 Other abnormalities of gait and mobility: Secondary | ICD-10-CM

## 2016-05-18 DIAGNOSIS — R2681 Unsteadiness on feet: Secondary | ICD-10-CM

## 2016-05-18 DIAGNOSIS — R278 Other lack of coordination: Secondary | ICD-10-CM

## 2016-05-18 NOTE — Therapy (Signed)
[facility] [address] [place], [place], [address] Phone: [phone]   Fax:  [phone]  Occupational Therapy Treatment  Patient Details  Name: [name] MRN: [id number] Date of Birth: 02-13-1941 Referring Provider: Dr. Wells Guiles Tat  Encounter Date: 05/18/2016      OT End of Session - 05/18/16 1401    Visit Number 2   Date for OT Re-Evaluation 07/12/16   Authorization Type [employer]; no visit limit or auth; G-code   Authorization - Visit Number 2   Authorization - Number of Visits 10   OT Start Time [phone]   OT Stop Time 0933   OT Time Calculation (min) 38 min      Past Medical History  Diagnosis Date  . Aortic stenosis, severe   . HTN (hypertension)   . Hypercholesteremia   . PVC's (premature ventricular contractions)   . A-fib (HCC)     paroxysmal  . Venous varices    . GERD (gastroesophageal reflux disease)   . Breast CA ([place]) 2004  . Chest pain   . Herpes zoster infection     History of herpes zoster infection  . Hypercholesterolemia   . Obesity   . History of kidney stones   . History of bronchitis   . Mucositis     Severe mucositis  . Irritable bowel syndrome   . History of hiatal hernia   . PONV (postoperative nausea and vomiting)   . Hemifacial spasm     Past Surgical History  Procedure Laterality Date  . Aortic valve replacement  04/13/06    St. Jude mechanical conduit  . Total abdominal hysterectomy    . Cystocele repair    . Rectocele repair    . Appendectomy    . Kidney stone surgery    . Foot surgery    . Cataract surgery      bilateral  . Esophageal manometry N/A 08/12/2014    Procedure: ESOPHAGEAL MANOMETRY (EM);  Surgeon: [name], MD;  Location: [facility];  Service: Endoscopy;  Laterality: N/A;  . Esophagogastroduodenoscopy N/A 10/03/2014    Procedure: ESOPHAGOGASTRODUODENOSCOPY (EGD);  Surgeon: [name], MD;  Location: [facility] ENDOSCOPY;  Service:  Endoscopy;  Laterality: N/A;  . Colonoscopy N/A 10/03/2014    Procedure: COLONOSCOPY;  Surgeon: [name], MD;  Location: [place];  Service: Endoscopy;  Laterality: N/A;    There were no vitals filed for this visit.                 Pt was instructed in coordination HEP, see pt instructions.       PWR [employer]) - 05/18/16 0919    PWR! exercises Moves in supine   PWR! Up x20   PWR! [employer] Twist x20   PWR! Step x20   Comments min-mod v.c./ demonstration             OT Education - 05/18/16 1357    Education provided Yes   Education Details PWR! supine, beginning coordination HEP   Person(s) Educated Patient   Methods Explanation;Demonstration;Verbal cues;Handout   Comprehension Verbalized understanding;Returned demonstration;Verbal cues required          OT Short Term Goals - 05/13/16 1641    OT SHORT TERM GOAL #1   Title Pt will be independent with PD-specific HEP.--check STGs 06/12/16   Time 4   Period Weeks   Status New   OT SHORT TERM GOAL #2   Title Pt will  report improved ability to cut food and decr spills when eating.   Time 4   Period Weeks   Status New   OT SHORT TERM GOAL #3   Title Pt will report incr ease with donning shoes and perform in reasonable amount of time.   Time 4   Period Weeks   Status New   OT SHORT TERM GOAL #4   Title Pt will report incr ease with fastening bra and buttons.   Time 4   Period Weeks   Status New   OT SHORT TERM GOAL #5   Title Pt will report incr ease turning in bed using large amplitude movement strategies.   Time 4   Period Weeks   Status New   OT SHORT TERM GOAL #6   Title Pt will write at least 3 sentences with at least 90% legibility and only min decr in size.   Baseline legibility 50% cursive, 75% print     Time 4   Period Weeks   Status New           OT Long Term Goals - 05/13/16 1646    OT LONG TERM GOAL #1   Title Pt will verbalize understanding of adaptive  strategies/AE for ADLs/IADLs prn.--check LTGs 06/12/16   Time 8   Period Weeks   Status New   OT LONG TERM GOAL #2   Title Pt will improve coordination for ADLs as shown by reducing time on 9-hole peg test by at least 5 sec with RUE.   Baseline  32.50   Time 8   Period Weeks   Status New   OT LONG TERM GOAL #3   Title Pt will improve coordination/functional reaching as shown by improving box and blocks test by at least 5 bilaterally.   Baseline R-44, L-40 blocks   Time 8   Period Weeks   Status New   OT LONG TERM GOAL #4   Title Pt will improve functional reaching/balance for IADLs as shown by improving standing functional reach by at least 2" bilaterally.   Baseline 8" bilaterally   Time 8   Period Weeks   Status New   OT LONG TERM GOAL #5   Title Pt will write at least 3 sentences with at least 90% legibility and only min decr in size.   Baseline legibility 50% cursive, 75% print, mod micrographia   Time 8   Period Weeks   Status New   OT LONG TERM GOAL #6   Title Pt will verbalize understanding of ways to prevent future complications and appropriate community resources prn.   Time 8   Period Weeks   Status New               Plan - 05/18/16 1359    Clinical Impression Statement Pt is progressing towards goals for PD specific HEP.   Rehab Potential Good   Clinical Impairments Affecting Rehab Potential hx of falls, possible PSP   OT Duration 8 weeks   Plan reinforce supine PWR! HEP, progress/ add to coordination HEP   Consulted and Agree with Plan of Care Patient      Patient will benefit from skilled therapeutic intervention in order to improve the following deficits and impairments:  Abnormal gait, Decreased coordination, Decreased range of motion, Difficulty walking, Impaired flexibility, Increased edema, Decreased safety awareness, Decreased endurance, Decreased activity tolerance, Decreased knowledge of precautions, Impaired tone, Pain, Impaired UE functional  use, Decreased scar mobility, Decreased knowledge of use of  DME, Decreased balance, Decreased cognition, Decreased mobility, Decreased strength  Visit Diagnosis: Other lack of coordination  Other symptoms and signs involving the nervous system    Problem List Patient Active Problem List   Diagnosis Date Noted  . Family history of colon cancer 07/30/2014  . Hiatal hernia 07/30/2014  . Chest pain 10/13/2011  . Disequilibrium 10/13/2011  . Heart valve replaced by other means 04/16/2011  . Anticoagulant long-term use 03/17/2011  . S/P AVR (aortic valve replacement)   . PVC's (premature ventricular contractions)   . HTN (hypertension)   . Hypercholesteremia   . A-fib ([place])   . GERD (gastroesophageal reflux disease)   . GERD 02/06/2010  . DIVERTICULOSIS-COLON 02/06/2010  . PERSONAL HX COLONIC POLYPS 02/06/2010  . MASTECTOMY, HX OF 02/06/2010    [name] 05/18/2016, 2:01 PM [name], [name] [phone] Phone: [phone] 2:02 PM 05/18/2016 [facility] [address] [place], [place], [address] Phone: [phone]   Fax:  [phone]  Name: [name] MRN: [id number] Date of Birth: 1941-10-06

## 2016-05-18 NOTE — Therapy (Signed)
[facility] [address] [place], [place], [address] Phone: [phone]   Fax:  [phone]  Physical Therapy Treatment  Patient Details  Name: [name] MRN: [id number] Date of Birth: 02-19-1941 Referring Provider: Metta Clines, MD  Encounter Date: 05/18/2016      PT End of Session - 05/18/16 1211    Visit Number 12   Number of Visits 16   Date for PT Re-Evaluation 05/28/16   Authorization Type G code required every 10th visit   PT Start Time 0803   PT Stop Time 0845   PT Time Calculation (min) 42 min   Equipment Utilized During Treatment Gait belt   Activity Tolerance Patient tolerated treatment well   Behavior During Therapy [facility] for tasks assessed/performed      Past Medical History  Diagnosis Date  . Aortic stenosis, severe   . HTN (hypertension)   . Hypercholesteremia   . PVC's (premature ventricular contractions)   . A-fib (HCC)     paroxysmal  . Venous varices    . GERD (gastroesophageal reflux disease)   . Breast CA ([place]) 2004  . Chest pain   . Herpes zoster infection     History of herpes zoster infection  . Hypercholesterolemia   . Obesity   . History of kidney stones   . History of bronchitis   . Mucositis     Severe mucositis  . Irritable bowel syndrome   . History of hiatal hernia   . PONV (postoperative nausea and vomiting)   . Hemifacial spasm     Past Surgical History  Procedure Laterality Date  . Aortic valve replacement  04/13/06    St. Jude mechanical conduit  . Total abdominal hysterectomy    . Cystocele repair    . Rectocele repair    . Appendectomy    . Kidney stone surgery    . Foot surgery    . Cataract surgery      bilateral  . Esophageal manometry N/A 08/12/2014    Procedure: ESOPHAGEAL MANOMETRY (EM);  Surgeon: [name], MD;  Location: [facility];  Service: Endoscopy;  Laterality: N/A;  . Esophagogastroduodenoscopy N/A 10/03/2014    Procedure:  ESOPHAGOGASTRODUODENOSCOPY (EGD);  Surgeon: [name], MD;  Location: [facility] ENDOSCOPY;  Service: Endoscopy;  Laterality: N/A;  . Colonoscopy N/A 10/03/2014    Procedure: COLONOSCOPY;  Surgeon: [name], MD;  Location: [facility];  Service: Endoscopy;  Laterality: N/A;    There were no vitals filed for this visit.      Subjective Assessment - 05/18/16 0758    Subjective The patient went to course on Friday in [place] re: parkinsonism.  She had neck x-ray yesterday.     Patient Stated Goals Improve balance.     Currently in Pain? Yes   Pain Score 8   began yesterday when waking   Pain Location Ankle   Pain Orientation Left   Pain Descriptors / Indicators Sharp;Sore   Pain Type Acute pain   Pain Onset More than a month ago   Pain Frequency Constant   Aggravating Factors  weight bearing   Pain Relieving Factors sitting      NEUROMUSCULAR RE-EDUCATION: Reviewed standing PWR up posture exercises, trunk rotation, weight shifting, and lateral step with return to midline.  Demonstrated exercises for the patient's husband and discussed purpose/goal of each activity. Patient benefited from cues to "step and hold, 1-2-3" and then move to next position.  Without cues,  she moves quick with low amplitude movements.  Sit<>stand with PWR up posture Standing turns with weight shifting activities  Gait: Sequencing of gait with SPC working on stopping movement and restarting when she is out of sequence with SBA with verbal cues on step length and sequence of cane use Gait without device with tactile cues for arm swing and verbal cues for stride length x 350 feet Gait with 180 degree turns with cues on using stop, PWR up posture with wide base and heels on floor, lateral weight shifting Gait with starts, stops, 360 degree turns, backwards walking, forwards walking at variable speeds with SBA to CGA  SELF CARE/HOME MANAGEMENT: Discussed at length nature of HEp with patient and her  caregiver, recommending cues to help with functional limitations during mobility Also discussed use of SPC due to falls and patient's confidence higher with use of device Educated on repetition of good habits during gait for improved carryover between therapy sessions Educated spouse on HEP and cues to provide        PT Education - 05/18/16 1210    Education provided Yes   Education Details RICE handout for ankle pain; significant focus of session on education for husband on proper cues to provide for posture, step length and sequencing.  Also discussed strategies to reduce freezing/festinating gait   Person(s) Educated Patient   Methods Explanation;Handout   Comprehension Verbalized understanding          PT Short Term Goals - 04/27/16 0815    PT SHORT TERM GOAL #1   Title The patient will be able to perform HEP with assist from her husband.   Baseline Reviewed with verbal cues on 04/27/2016   Time 4   Period Weeks   Status Achieved   PT SHORT TERM GOAL #2   Title The patient will improve gait speed from 2.45 ft/sec to > or equal to 2.7 ft/sec to demo transition to "full community ambulator" classification of gait.   Baseline improved to 2.83 ft/sec on 04/27/2016   Time 4   Period Weeks   Status Achieved   PT SHORT TERM GOAL #3   Title The patient will improve Berg score from 35/56 to > or equal to 40/56 to demo decreased fall risk.   Baseline Met on 04/27/2016   Time 4   Period Weeks   Status Achieved   PT SHORT TERM GOAL #4   Title The patient will perform sit<>stand without UE support x 5 repetitions from chair without assistance.   Baseline Met on 04/20/2016   Time 4   Period Weeks   Status Achieved   PT SHORT TERM GOAL #5   Title The patient will move floor<>stand with UE support modified indep.   Baseline Met on 04/27/2016   Time 4   Period Weeks   Status Achieved           PT Long Term Goals - 05/13/16 7564    PT LONG TERM GOAL #1   Title The patient will  improve activities of balance confidence score from 41.9% to > or equal to 55%.   Baseline Target date: 05/29/2016   Time 8   Period Weeks   PT LONG TERM GOAL #2   Title The patient will improve Berg from 35/56 to > or equal to 42/56 to demo decreasing risk for falls.   Baseline Improved to 45/56.   Time 8   Period Weeks   Status Achieved   PT LONG TERM GOAL #  3   Title The patient will reduce TUG score from 16.66 seconds to < or equal to 13.5 seconds to demo decreasing risk for falls.   Baseline Met on 05/11/2016 scoring 11.65 seconds without a device indep   Time 8   Period Weeks   Status Achieved   PT LONG TERM GOAL #4   Title The patient will negotiate indoor surfaces without a devide independently evidenced by no loss of balance on level surfaces x 300 feet.   Baseline Met on 05/12/2016 however demo's inconsistentency with ths activity.   Time 8   Period Weeks   Status Achieved   PT LONG TERM GOAL #5   Title The patient will move sit<>stand x 5 reps in < or equal to 10 seconds (reduced from 12.53 at baseline).   Baseline Met on 05/11/2016 with 9.50 seconds   Time 8   Period Weeks   Status Achieved   Additional Long Term Goals   Additional Long Term Goals Yes   PT LONG TERM GOAL #6   Title Continue to progress HEP for post d/c plan.   Baseline Target date  05/29/2016   Status New               Plan - 05/18/16 1212    Clinical Impression Statement The patient's husband seems very supportive and able to deliver cues for reminders for patient during functional task to help reduce freezing, lengthen step length, and improve sequencing of gait.   PT Treatment/Interventions ADLs/Self Care Home Management;Neuromuscular re-education;Balance training;Vestibular;Therapeutic exercise;Therapeutic activities;Functional mobility training;Stair training;Gait training;Patient/family education   PT Next Visit Plan Gait training, discuss d/c, balance training, PWR exercises.   Consulted and  Agree with Plan of Care Patient      Patient will benefit from skilled therapeutic intervention in order to improve the following deficits and impairments:  Abnormal gait, Decreased activity tolerance, Decreased balance, Decreased mobility, Decreased strength, Pain, Difficulty walking, Dizziness  Visit Diagnosis: Other symptoms and signs involving the nervous system  Unsteadiness on feet  Other abnormalities of gait and mobility     Problem List Patient Active Problem List   Diagnosis Date Noted  . Family history of colon cancer 07/30/2014  . Hiatal hernia 07/30/2014  . Chest pain 10/13/2011  . Disequilibrium 10/13/2011  . Heart valve replaced by other means 04/16/2011  . Anticoagulant long-term use 03/17/2011  . S/P AVR (aortic valve replacement)   . PVC's (premature ventricular contractions)   . HTN (hypertension)   . Hypercholesteremia   . A-fib ([place])   . GERD (gastroesophageal reflux disease)   . GERD 02/06/2010  . DIVERTICULOSIS-COLON 02/06/2010  . PERSONAL HX COLONIC POLYPS 02/06/2010  . MASTECTOMY, HX OF 02/06/2010    WEAVER,CHRISTINA, PT 05/18/2016, 12:13 PM  [facility] [address] [place], [place], [address] Phone: [phone]   Fax:  [phone]  Name: ULYSSA WALTHOUR MRN: [id number] Date of Birth: 1941/09/30

## 2016-05-18 NOTE — Patient Instructions (Signed)

## 2016-05-18 NOTE — Patient Instructions (Signed)

## 2016-05-20 ENCOUNTER — Ambulatory Visit (HOSPITAL_COMMUNITY)
Admission: RE | Admit: 2016-05-20 | Discharge: 2016-05-20 | Disposition: A | Discharge status: Home / Self Care | Payer: PPO | Source: Ambulatory Visit | Attending: Neurology | Admitting: Neurology

## 2016-05-20 ENCOUNTER — Ambulatory Visit: Payer: PPO | Admitting: Physical Therapy

## 2016-05-20 DIAGNOSIS — R1319 Other dysphagia: Secondary | ICD-10-CM | POA: Diagnosis not present

## 2016-05-20 DIAGNOSIS — R1310 Dysphagia, unspecified: Secondary | ICD-10-CM | POA: Diagnosis not present

## 2016-05-20 DIAGNOSIS — R2689 Other abnormalities of gait and mobility: Secondary | ICD-10-CM

## 2016-05-20 DIAGNOSIS — R2681 Unsteadiness on feet: Secondary | ICD-10-CM

## 2016-05-20 DIAGNOSIS — G20 Parkinson's disease: Secondary | ICD-10-CM | POA: Diagnosis not present

## 2016-05-20 NOTE — Progress Notes (Signed)
  MBSS complete. Full report located under chart review in imaging section. Click on DG swallow function.   Recommend: regular texture/thin liquids    Houston Siren M.Ed [employer] [phone]

## 2016-05-20 NOTE — Therapy (Signed)
[facility] [address] [place], [place], [address] Phone: [phone]   Fax:  [phone]  Physical Therapy Treatment  Patient Details  Name: [name] MRN: [id number] Date of Birth: [birth year] Referring Provider: Metta Clines, MD  Encounter Date: 05/20/2016      PT End of Session - 05/21/16 0749    Visit Number 13   Number of Visits 16   Date for PT Re-Evaluation 05/28/16   Authorization Type G code required every 10th visit   PT Start Time 0803   PT Stop Time 0843   PT Time Calculation (min) 40 min   Equipment Utilized During Treatment Gait belt   Activity Tolerance Patient tolerated treatment well   Behavior During Therapy [facility] for tasks assessed/performed      Past Medical History  Diagnosis Date  . Aortic stenosis, severe   . HTN (hypertension)   . Hypercholesteremia   . PVC's (premature ventricular contractions)   . A-fib (HCC)     paroxysmal  . Venous varices    . GERD (gastroesophageal reflux disease)   . Breast CA ([place]) 2004  . Chest pain   . Herpes zoster infection     History of herpes zoster infection  . Hypercholesterolemia   . Obesity   . History of kidney stones   . History of bronchitis   . Mucositis     Severe mucositis  . Irritable bowel syndrome   . History of hiatal hernia   . PONV (postoperative nausea and vomiting)   . Hemifacial spasm     Past Surgical History  Procedure Laterality Date  . Aortic valve replacement  04/13/06    St. Jude mechanical conduit  . Total abdominal hysterectomy    . Cystocele repair    . Rectocele repair    . Appendectomy    . Kidney stone surgery    . Foot surgery    . Cataract surgery      bilateral  . Esophageal manometry N/A 08/12/2014    Procedure: ESOPHAGEAL MANOMETRY (EM);  Surgeon: [name], MD;  Location: [facility] ENDOSCOPY;  Service: Endoscopy;  Laterality: N/A;  . Esophagogastroduodenoscopy N/A 10/03/2014    Procedure:  ESOPHAGOGASTRODUODENOSCOPY (EGD);  Surgeon: [name], MD;  Location: [facility] ENDOSCOPY;  Service: Endoscopy;  Laterality: N/A;  . Colonoscopy N/A 10/03/2014    Procedure: COLONOSCOPY;  Surgeon: [name], MD;  Location: [facility];  Service: Endoscopy;  Laterality: N/A;    There were no vitals filed for this visit.      Subjective Assessment - 05/20/16 0805    Subjective Almost fell this mornign trying to get something off the dresser, but I caught myself.   Patient Stated Goals Improve balance.     Currently in Pain? Yes   Pain Score 6    Pain Location Ankle   Pain Orientation Left   Pain Descriptors / Indicators Sore   Pain Type Acute pain   Pain Frequency Constant   Aggravating Factors  weightbearing   Pain Relieving Factors sitting   Multiple Pain Sites Yes   Pain Score 4   Pain Location Neck   Pain Orientation Left   Pain Descriptors / Indicators --  popping   Pain Type Chronic pain   Pain Onset More than a month ago   Pain Frequency Constant   Aggravating Factors  turning head   Pain Relieving Factors unsure  Gait:   -Gait 230 ft using single point cane, with supervision, with patient using cane in RUE (versus normally in LUE) due to pain, discomfort in L ankle.  Pt has initial difficulty with sequencing cane, but with cues for even, consistent step length, pt able to more consistently sequence cane. Gait x 400 ft in gym area, with turns to negotiate furniture and turns in tight spaces, using cane, with verbal cues for best posture, increased step length and appropriate cane sequence.   -Turning practice with SPC, 90 degrees and 180 degrees, with widened BOS and cues for weightshifting turns. Utilized cones in rectangular pattern, for variable direction turning, using cane with supervision with cues for improved weightshifting for turning.   Neuro Re-education: Counter balance exercises, including modified PWR! Moves in standing:  Step  and weightshift to side, alternating sides 10  reps, then step and weigthshift forward alternating sides 10 reps.  Side stepping along counter, 4 reps, with cues to take longest step length.  When side stepping to L, pt has increased L lateral lean, needing cues for best upright posture upon stopping to prevent LOB.   -Sit<>stand with PWR! Up (best upright posture upon standing), x 15 reps from chair height.  Towards rep #8, pt has increased posterior lean, with therapist providing verbal and manual cues for increased forward lean/forward excursion for safety with transfer and decreased posterior lean.      Self Care: Discussed cueing techniques that therapist provided to husband in previous session, and pt reports that his cueing seems to be helping her at home.  She feels that her R ankle pain is limiting her mobility this week, despite her reports of iceing, elevating and resting that ankle during the day.  Recommended pt speak with MD about recent increased ankle pain, if it continues, especially if it continues to limit mobility.  Discussed plans for upcoming discharge within the next week-pt appears to be in agreement.            PT Short Term Goals - 04/27/16 0815    PT SHORT TERM GOAL #1   Title The patient will be able to perform HEP with assist from her husband.   Baseline Reviewed with verbal cues on 04/27/2016   Time 4   Period Weeks   Status Achieved   PT SHORT TERM GOAL #2   Title The patient will improve gait speed from 2.45 ft/sec to > or equal to 2.7 ft/sec to demo transition to "full community ambulator" classification of gait.   Baseline improved to 2.83 ft/sec on 04/27/2016   Time 4   Period Weeks   Status Achieved   PT SHORT TERM GOAL #3   Title The patient will improve Berg score from 35/56 to > or equal to 40/56 to demo decreased fall risk.   Baseline Met on 04/27/2016   Time 4   Period Weeks   Status Achieved   PT SHORT TERM GOAL #4   Title The patient will  perform sit<>stand without UE support x 5 repetitions from chair without assistance.   Baseline Met on 04/20/2016   Time 4   Period Weeks   Status Achieved   PT SHORT TERM GOAL #5   Title The patient will move floor<>stand with UE support modified indep.   Baseline Met on 04/27/2016   Time 4   Period Weeks   Status Achieved           PT Long Term Goals - 05/13/16 0924      PT LONG TERM GOAL #1   Title The patient will improve activities of balance confidence score from 41.9% to > or equal to 55%.   Baseline Target date: 05/29/2016   Time 8   Period Weeks   PT LONG TERM GOAL #2   Title The patient will improve Berg from 35/56 to > or equal to 42/56 to demo decreasing risk for falls.   Baseline Improved to 45/56.   Time 8   Period Weeks   Status Achieved   PT LONG TERM GOAL #3   Title The patient will reduce TUG score from 16.66 seconds to < or equal to 13.5 seconds to demo decreasing risk for falls.   Baseline Met on 05/11/2016 scoring 11.65 seconds without a device indep   Time 8   Period Weeks   Status Achieved   PT LONG TERM GOAL #4   Title The patient will negotiate indoor surfaces without a devide independently evidenced by no loss of balance on level surfaces x 300 feet.   Baseline Met on 05/12/2016 however demo's inconsistentency with ths activity.   Time 8   Period Weeks   Status Achieved   PT LONG TERM GOAL #5   Title The patient will move sit<>stand x 5 reps in < or equal to 10 seconds (reduced from 12.53 at baseline).   Baseline Met on 05/11/2016 with 9.50 seconds   Time 8   Period Weeks   Status Achieved   Additional Long Term Goals   Additional Long Term Goals Yes   PT LONG TERM GOAL #6   Title Continue to progress HEP for post d/c plan.   Baseline Target date  05/29/2016   Status New               Plan - 05/21/16 0749    Clinical Impression Statement Pt's gait sequence and turning practice seems to be improved by cueing during therapy session.  Pt  likely on target to meet remaining goals.  She may benefit from discussion on community fitness, including [facility] as an option.   PT Treatment/Interventions ADLs/Self Care Home Management;Neuromuscular re-education;Balance training;Vestibular;Therapeutic exercise;Therapeutic activities;Functional mobility training;Stair training;Gait training;Patient/family education   PT Next Visit [place] for community fitness upon D/C from PT; check remaining goals and plan for discharge   Consulted and Agree with Plan of Care Patient      Patient will benefit from skilled therapeutic intervention in order to improve the following deficits and impairments:  Abnormal gait, Decreased activity tolerance, Decreased balance, Decreased mobility, Decreased strength, Pain, Difficulty walking, Dizziness  Visit Diagnosis: Other abnormalities of gait and mobility  Unsteadiness on feet     Problem List Patient Active Problem List   Diagnosis Date Noted  . Family history of colon cancer 07/30/2014  . Hiatal hernia 07/30/2014  . Chest pain 10/13/2011  . Disequilibrium 10/13/2011  . Heart valve replaced by other means 04/16/2011  . Anticoagulant long-term use 03/17/2011  . S/P AVR (aortic valve replacement)   . PVC's (premature ventricular contractions)   . HTN (hypertension)   . Hypercholesteremia   . A-fib ([place])   . GERD (gastroesophageal reflux disease)   . GERD 02/06/2010  . DIVERTICULOSIS-COLON 02/06/2010  . PERSONAL HX COLONIC POLYPS 02/06/2010  . MASTECTOMY, HX OF 02/06/2010    Zyir Gassert W. 05/21/2016, 7:52 AM Frazier Butt., PT [facility] [address] [place], [place], [address] Phone: [phone]   Fax:  [phone]  Name: [name] MRN: [id number] Date of Birth: 04/21/1941    

## 2016-05-25 ENCOUNTER — Ambulatory Visit: Payer: PPO | Admitting: Physical Therapy

## 2016-05-25 DIAGNOSIS — R8299 Other abnormal findings in urine: Secondary | ICD-10-CM | POA: Diagnosis not present

## 2016-05-25 DIAGNOSIS — R2689 Other abnormalities of gait and mobility: Secondary | ICD-10-CM

## 2016-05-25 DIAGNOSIS — I358 Other nonrheumatic aortic valve disorders: Secondary | ICD-10-CM | POA: Diagnosis not present

## 2016-05-25 DIAGNOSIS — Z7901 Long term (current) use of anticoagulants: Secondary | ICD-10-CM | POA: Diagnosis not present

## 2016-05-25 DIAGNOSIS — N390 Urinary tract infection, site not specified: Secondary | ICD-10-CM | POA: Diagnosis not present

## 2016-05-25 NOTE — Therapy (Signed)
[facility] [address] [place], [place], [address] Phone: [phone]   Fax:  [phone]  Physical Therapy Treatment  Patient Details  Name: [name] MRN: [id number] Date of Birth: 1941-11-11 Referring Provider: Metta Clines, MD  Encounter Date: 05/25/2016      PT End of Session - 05/25/16 1137    Visit Number 14   Number of Visits 16   Date for PT Re-Evaluation 05/28/16   Authorization Type G code required every 10th visit   PT Start Time 0902  Pt arrives late due to Coumadin check at MD   PT Stop Time 0929   PT Time Calculation (min) 27 min   Equipment Utilized During Treatment Gait belt   Activity Tolerance Patient tolerated treatment well   Behavior During Therapy [facility] for tasks assessed/performed      Past Medical History  Diagnosis Date  . Aortic stenosis, severe   . HTN (hypertension)   . Hypercholesteremia   . PVC's (premature ventricular contractions)   . A-fib (HCC)     paroxysmal  . Venous varices    . GERD (gastroesophageal reflux disease)   . Breast CA ([place]) 2004  . Chest pain   . Herpes zoster infection     History of herpes zoster infection  . Hypercholesterolemia   . Obesity   . History of kidney stones   . History of bronchitis   . Mucositis     Severe mucositis  . Irritable bowel syndrome   . History of hiatal hernia   . PONV (postoperative nausea and vomiting)   . Hemifacial spasm     Past Surgical History  Procedure Laterality Date  . Aortic valve replacement  04/13/06    St. Jude mechanical conduit  . Total abdominal hysterectomy    . Cystocele repair    . Rectocele repair    . Appendectomy    . Kidney stone surgery    . Foot surgery    . Cataract surgery      bilateral  . Esophageal manometry N/A 08/12/2014    Procedure: ESOPHAGEAL MANOMETRY (EM);  Surgeon: [name], MD;  Location: [facility] ENDOSCOPY;  Service: Endoscopy;  Laterality: N/A;  .  Esophagogastroduodenoscopy N/A 10/03/2014    Procedure: ESOPHAGOGASTRODUODENOSCOPY (EGD);  Surgeon: [name], MD;  Location[facility] ENDOSCOPY;  Service: Endoscopy;  Laterality: N/A;  . Colonoscopy N/A 10/03/2014    Procedure: COLONOSCOPY;  Surgeon: [name], MD;  Location: [facility];  Service: Endoscopy;  Laterality: N/A;    There were no vitals filed for this visit.      Subjective Assessment - 05/25/16 0902    Subjective Ankle somewhat better.  X-rays done for neck last week-degenerative disk in neck causing pain; to get set up for MRI and may possibly have to have cortisone shot in neck.  Late today because of Coumadin check.   Pertinent History h/o L ear pain 2 months ago with dizziness.     Patient Stated Goals Improve balance.     Currently in Pain? Yes   Pain Score 3    Pain Location Ankle   Pain Orientation Left   Pain Descriptors / Indicators Sore   Pain Type Acute pain   Pain Frequency Constant   Aggravating Factors  walking (not as bad as before)   Pain Relieving Factors elevating   Multiple Pain Sites Yes   Pain Score 6   Pain Location Neck   Pain Orientation Left  Pain Descriptors / Indicators Aching   Pain Type Chronic pain   Pain Onset More than a month ago   Pain Frequency Intermittent   Aggravating Factors  turning head   Pain Relieving Factors unsure                         [facility] Adult PT Treatment/Exercise - 05/25/16 0914    Ambulation/Gait   Ambulation/Gait Yes   Ambulation/Gait Assistance 5: Supervision   Ambulation/Gait Assistance Details Occasional cues for posture, looking ahead, and for deliberate/widened cane sequence.  Pt stops twice to reset cane sequence.   Ambulation Distance (Feet) 500 Feet  5 minutes   Assistive device Straight cane   Gait Pattern Step-through pattern;Decreased stride length   Ambulation Surface Level;Indoor;Unlevel;Outdoor   Gait Comments Gait training outdoor surfaces on sidewalk, x 150 ft, with  SPC, with min guard assistance; cues for slowed pace during declines, increased pace and step length during inclines.  Pt needs to stop and reset posture and cane sequence several times.  Pt needs extra cues for increased step length and foot clearance during outdoor gait.   Self-Care   Self-Care Other Self-Care Comments   Other Self-Care Comments  Provided information on [place] as option for continued community fitness upon D/C from PT.  Discussed and provided patient with information on optimal fitness plan upon D/C from PT.        Discussed walking progression for gait endurance and reinforcement of optimal gait pattern-starting at 5 minutes walking inside home or outside home with husband supervision.          PT Education - 05/25/16 1136    Education provided Yes   Education Details [facility] [employer] as option upon D/C from PT; optimal community fitness upon D/C from PT.   Person(s) Educated Patient   Methods Explanation;Demonstration;Handout   Comprehension Verbalized understanding          PT Short Term Goals - 04/27/16 0815    PT SHORT TERM GOAL #1   Title The patient will be able to perform HEP with assist from her husband.   Baseline Reviewed with verbal cues on 04/27/2016   Time 4   Period Weeks   Status Achieved   PT SHORT TERM GOAL #2   Title The patient will improve gait speed from 2.45 ft/sec to > or equal to 2.7 ft/sec to demo transition to "full community ambulator" classification of gait.   Baseline improved to 2.83 ft/sec on 04/27/2016   Time 4   Period Weeks   Status Achieved   PT SHORT TERM GOAL #3   Title The patient will improve Berg score from 35/56 to > or equal to 40/56 to demo decreased fall risk.   Baseline Met on 04/27/2016   Time 4   Period Weeks   Status Achieved   PT SHORT TERM GOAL #4   Title The patient will perform sit<>stand without UE support x 5 repetitions from chair without assistance.   Baseline Met on 04/20/2016   Time 4    Period Weeks   Status Achieved   PT SHORT TERM GOAL #5   Title The patient will move floor<>stand with UE support modified indep.   Baseline Met on 04/27/2016   Time 4   Period Weeks   Status Achieved           PT Long Term Goals - 05/13/16 0924    PT LONG TERM GOAL #1  Title The patient will improve activities of balance confidence score from 41.9% to > or equal to 55%.   Baseline Target date: 05/29/2016   Time 8   Period Weeks   PT LONG TERM GOAL #2   Title The patient will improve Berg from 35/56 to > or equal to 42/56 to demo decreasing risk for falls.   Baseline Improved to 45/56.   Time 8   Period Weeks   Status Achieved   PT LONG TERM GOAL #3   Title The patient will reduce TUG score from 16.66 seconds to < or equal to 13.5 seconds to demo decreasing risk for falls.   Baseline Met on 05/11/2016 scoring 11.65 seconds without a device indep   Time 8   Period Weeks   Status Achieved   PT LONG TERM GOAL #4   Title The patient will negotiate indoor surfaces without a devide independently evidenced by no loss of balance on level surfaces x 300 feet.   Baseline Met on 05/12/2016 however demo's inconsistentency with ths activity.   Time 8   Period Weeks   Status Achieved   PT LONG TERM GOAL #5   Title The patient will move sit<>stand x 5 reps in < or equal to 10 seconds (reduced from 12.53 at baseline).   Baseline Met on 05/11/2016 with 9.50 seconds   Time 8   Period Weeks   Status Achieved   Additional Long Term Goals   Additional Long Term Goals Yes   PT LONG TERM GOAL #6   Title Continue to progress HEP for post d/c plan.   Baseline Target date  05/29/2016   Status New               Plan - 05/25/16 1137    Clinical Impression Statement Provided information today for paitent regarding optimal continued fitness upon D/C from PT as well as [place] as option.  Practiced gait indoors and outdoors, with PT recommending pt have husband for safety when  walking outdoors.   PT Treatment/Interventions ADLs/Self Care Home Management;Neuromuscular re-education;Balance training;Vestibular;Therapeutic exercise;Therapeutic activities;Functional mobility training;Stair training;Gait training;Patient/family education   PT Next Visit Plan Plan to check remaining goals and discharge next visit.   Consulted and Agree with Plan of Care Patient      Patient will benefit from skilled therapeutic intervention in order to improve the following deficits and impairments:  Abnormal gait, Decreased activity tolerance, Decreased balance, Decreased mobility, Decreased strength, Pain, Difficulty walking, Dizziness  Visit Diagnosis: Other abnormalities of gait and mobility     Problem List Patient Active Problem List   Diagnosis Date Noted  . Family history of colon cancer 07/30/2014  . Hiatal hernia 07/30/2014  . Chest pain 10/13/2011  . Disequilibrium 10/13/2011  . Heart valve replaced by other means 04/16/2011  . Anticoagulant long-term use 03/17/2011  . S/P AVR (aortic valve replacement)   . PVC's (premature ventricular contractions)   . HTN (hypertension)   . Hypercholesteremia   . A-fib ([place])   . GERD (gastroesophageal reflux disease)   . GERD 02/06/2010  . DIVERTICULOSIS-COLON 02/06/2010  . PERSONAL HX COLONIC POLYPS 02/06/2010  . MASTECTOMY, HX OF 02/06/2010    Omauri Boeve W. 05/25/2016, 11:39 AM Frazier Butt., PT [facility] [address] [place], [place], [address] Phone: [phone]   Fax:  [phone]  Name: [name] MRN: [id number] Date of Birth: October 01, 1941

## 2016-05-25 NOTE — Patient Instructions (Signed)

## 2016-05-26 ENCOUNTER — Other Ambulatory Visit: Payer: Self-pay | Admitting: Internal Medicine

## 2016-05-26 DIAGNOSIS — M542 Cervicalgia: Secondary | ICD-10-CM

## 2016-05-27 ENCOUNTER — Ambulatory Visit: Payer: PPO | Attending: Neurology | Admitting: Rehabilitative and Restorative Service Providers"

## 2016-05-27 ENCOUNTER — Ambulatory Visit: Payer: PPO | Admitting: Occupational Therapy

## 2016-05-27 DIAGNOSIS — R41842 Visuospatial deficit: Secondary | ICD-10-CM

## 2016-05-27 DIAGNOSIS — R2681 Unsteadiness on feet: Secondary | ICD-10-CM | POA: Diagnosis not present

## 2016-05-27 DIAGNOSIS — R278 Other lack of coordination: Secondary | ICD-10-CM

## 2016-05-27 DIAGNOSIS — R29818 Other symptoms and signs involving the nervous system: Secondary | ICD-10-CM | POA: Diagnosis not present

## 2016-05-27 DIAGNOSIS — R2689 Other abnormalities of gait and mobility: Secondary | ICD-10-CM

## 2016-05-27 NOTE — Therapy (Signed)
[facility] [address] [place], [place], [address] Phone: [phone]   Fax:  [phone]  Physical Therapy Treatment  Patient Details  Name: [name] MRN: [id number] Date of Birth: 1941-05-14 Referring Provider: Metta Clines, MD  Encounter Date: 05/27/2016      PT End of Session - 05/28/16 0802    Visit Number 15   Number of Visits 16   Date for PT Re-Evaluation 05/28/16   Authorization Type G code required every 10th visit   PT Start Time 0847   PT Stop Time 0932   PT Time Calculation (min) 45 min   Equipment Utilized During Treatment Gait belt   Activity Tolerance Patient tolerated treatment well   Behavior During Therapy [facility] for tasks assessed/performed      Past Medical History  Diagnosis Date  . Aortic stenosis, severe   . HTN (hypertension)   . Hypercholesteremia   . PVC's (premature ventricular contractions)   . A-fib (HCC)     paroxysmal  . Venous varices    . GERD (gastroesophageal reflux disease)   . Breast CA ([place]) 2004  . Chest pain   . Herpes zoster infection     History of herpes zoster infection  . Hypercholesterolemia   . Obesity   . History of kidney stones   . History of bronchitis   . Mucositis     Severe mucositis  . Irritable bowel syndrome   . History of hiatal hernia   . PONV (postoperative nausea and vomiting)   . Hemifacial spasm     Past Surgical History  Procedure Laterality Date  . Aortic valve replacement  04/13/06    St. Jude mechanical conduit  . Total abdominal hysterectomy    . Cystocele repair    . Rectocele repair    . Appendectomy    . Kidney stone surgery    . Foot surgery    . Cataract surgery      bilateral  . Esophageal manometry N/A 08/12/2014    Procedure: ESOPHAGEAL MANOMETRY (EM);  Surgeon: [name], MD;  Location: [facility] ENDOSCOPY;  Service: Endoscopy;  Laterality: N/A;  . Esophagogastroduodenoscopy N/A 10/03/2014    Procedure:  ESOPHAGOGASTRODUODENOSCOPY (EGD);  Surgeon: [name], MD;  Location[facility] ENDOSCOPY;  Service: Endoscopy;  Laterality: N/A;  . Colonoscopy N/A 10/03/2014    Procedure: COLONOSCOPY;  Surgeon: [name], MD;  Location: [place];  Service: Endoscopy;  Laterality: N/A;    There were no vitals filed for this visit.      Subjective Assessment - 05/27/16 0852    Subjective The patient reports that she fell yesterday when turning quickly indoors.  She was able to get up on her own. She reports that [facility] gym is far from her home and she doesn't feel that she will be able to drive that far to work out.    Her husband  is open to trying [facility] and plans to check on programs available once completing OT services.   Patient Stated Goals Improve balance.     Currently in Pain? Yes   Pain Score 3    Pain Location Neck   Pain Orientation Left   Pain Descriptors / Indicators Sore   Pain Type Chronic pain   Pain Onset More than a month ago   Pain Frequency Constant   Aggravating Factors  turning   Pain Relieving Factors sitting still   Effect of Pain on Daily Activities Ankle pain 3/10 (  see prior note) and neck pain 3/10.      Gait: Ambulation with SPC and cues on sequencing x 200 feet x 3 reps Patient c/o "clicking" sensation continuing in her neck with gait activities    THERAPEUTIC EXERCISE: IT band stretch supine with rotating to side- pain noted with palpation of L IT band  SELF CARE/HOME MANAGEMENT: Discussed sleeping positions for home and patient notes she cannot get comfortable on R side due to L hip pain.  PT demonstrated and had patient try positioning on side with pillows for support of neck and L hip. Discussed post d/c exercise options of [facility], [employer] [facility] near home, and community exercise group offered at [facility]. Discussed need for f/u screen in 3-6 months due to chronic nature of deficits and to assess status Answered questions for patient/husband regarding exercises, fall risk,  and safety.        PT Education - 05/27/16 0906    Education provided Yes   Education Details Sleeping positions, IT band stretch   Person(s) Educated Patient   Methods Explanation;Demonstration;Handout   Comprehension Verbalized understanding;Returned demonstration          PT Short Term Goals - 04/27/16 0815    PT SHORT TERM GOAL #1   Title The patient will be able to perform HEP with assist from her husband.   Baseline Reviewed with verbal cues on 04/27/2016   Time 4   Period Weeks   Status Achieved   PT SHORT TERM GOAL #2   Title The patient will improve gait speed from 2.45 ft/sec to > or equal to 2.7 ft/sec to demo transition to "full community ambulator" classification of gait.   Baseline improved to 2.83 ft/sec on 04/27/2016   Time 4   Period Weeks   Status Achieved   PT SHORT TERM GOAL #3   Title The patient will improve Berg score from 35/56 to > or equal to 40/56 to demo decreased fall risk.   Baseline Met on 04/27/2016   Time 4   Period Weeks   Status Achieved   PT SHORT TERM GOAL #4   Title The patient will perform sit<>stand without UE support x 5 repetitions from chair without assistance.   Baseline Met on 04/20/2016   Time 4   Period Weeks   Status Achieved   PT SHORT TERM GOAL #5   Title The patient will move floor<>stand with UE support modified indep.   Baseline Met on 04/27/2016   Time 4   Period Weeks   Status Achieved           PT Long Term Goals - 05/27/16 0913    PT LONG TERM GOAL #1   Title The patient will improve activities of balance confidence score from 41.9% to > or equal to 55%.   Baseline Target date: 05/29/2016   Time 8   Period Weeks   Status Deferred   PT LONG TERM GOAL #2   Title The patient will improve Berg from 35/56 to > or equal to 42/56 to demo decreasing risk for falls.   Baseline Improved to 45/56.   Time 8   Period Weeks   Status Achieved   PT LONG TERM GOAL #3   Title The patient will reduce TUG score from 16.66  seconds to < or equal to 13.5 seconds to demo decreasing risk for falls.   Baseline Met on 05/11/2016 scoring 11.65 seconds without a device indep   Time 8   Period Weeks  Status Achieved   PT LONG TERM GOAL #4   Title The patient will negotiate indoor surfaces without a devide independently evidenced by no loss of balance on level surfaces x 300 feet.   Baseline Met on 05/12/2016 however demo's inconsistentency with ths activity.   Time 8   Period Weeks   Status Achieved   PT LONG TERM GOAL #5   Title The patient will move sit<>stand x 5 reps in < or equal to 10 seconds (reduced from 12.53 at baseline).   Baseline Met on 05/11/2016 with 9.50 seconds   Time 8   Period Weeks   Status Achieved   PT LONG TERM GOAL #6   Title Continue to progress HEP for post d/c plan.   Baseline Met on 05/27/2016   Status Achieved               Plan - 05/28/16 0803    Clinical Impression Statement The patient met LTGs.  She continues with fall risk and mobility issues related to to diagnosis and has had 2 falls in the past 4 weeks (one while tripping over cane that she propped against chair, and one while turning quickly).  PT has provided education to patient and husband on HEP, community resources, and fall risk.  Recommended screen in 3-6 months for f/u.   PT Next Visit Plan Discharge today.   Consulted and Agree with Plan of Care Patient;Family member/caregiver   Family Member Consulted spouse      Patient will benefit from skilled therapeutic intervention in order to improve the following deficits and impairments:     Visit Diagnosis: Other abnormalities of gait and mobility  Unsteadiness on feet  Other symptoms and signs involving the nervous system     Problem List Patient Active Problem List   Diagnosis Date Noted  . Family history of colon cancer 07/30/2014  . Hiatal hernia 07/30/2014  . Chest pain 10/13/2011  . Disequilibrium 10/13/2011  . Heart valve replaced by other  means 04/16/2011  . Anticoagulant long-term use 03/17/2011  . S/P AVR (aortic valve replacement)   . PVC's (premature ventricular contractions)   . HTN (hypertension)   . Hypercholesteremia   . A-fib ([place])   . GERD (gastroesophageal reflux disease)   . GERD 02/06/2010  . DIVERTICULOSIS-COLON 02/06/2010  . PERSONAL HX COLONIC POLYPS 02/06/2010  . MASTECTOMY, HX OF 02/06/2010    Umeka Wrench, PT 05/28/2016, 3:23 PM  [facility] [address] [place], [place], [address] Phone: [phone]   Fax:  [phone]  Name: [name] MRN: [id number] Date of Birth: May 06, 1941

## 2016-05-27 NOTE — Patient Instructions (Addendum)
Outer Hip Stretch: Reclined IT Band Stretch (Strap)    Strap around opposite foot, pull across only as far as possible with shoulders on mat. Hold for __20 seconds. Repeat ___2_ times each leg.  Copyright  VHI. All rights reserved.

## 2016-05-27 NOTE — Therapy (Signed)
[facility] [address] [place], [place], [address] Phone: [phone]   Fax:  [phone]  Occupational Therapy Treatment  Patient Details  Name: [name] MRN: [id number] Date of Birth: March 25, 1941 Referring Provider: Dr. Wells Guiles Tat  Encounter Date: 05/27/2016      OT End of Session - 05/27/16 1008    Visit Number 3   Number of Visits 17   Date for OT Re-Evaluation 07/12/16   Authorization - Visit Number 3   Authorization - Number of Visits 10   OT Start Time [id number]   OT Stop Time [id number]   OT Time Calculation (min) 40 min      Past Medical History  Diagnosis Date  . Aortic stenosis, severe   . HTN (hypertension)   . Hypercholesteremia   . PVC's (premature ventricular contractions)   . A-fib (HCC)     paroxysmal  . Venous varices    . GERD (gastroesophageal reflux disease)   . Breast CA ([place]) 2004  . Chest pain   . Herpes zoster infection     History of herpes zoster infection  . Hypercholesterolemia   . Obesity   . History of kidney stones   . History of bronchitis   . Mucositis     Severe mucositis  . Irritable bowel syndrome   . History of hiatal hernia   . PONV (postoperative nausea and vomiting)   . Hemifacial spasm     Past Surgical History  Procedure Laterality Date  . Aortic valve replacement  04/13/06    St. Jude mechanical conduit  . Total abdominal hysterectomy    . Cystocele repair    . Rectocele repair    . Appendectomy    . Kidney stone surgery    . Foot surgery    . Cataract surgery      bilateral  . Esophageal manometry N/A 08/12/2014    Procedure: ESOPHAGEAL MANOMETRY (EM);  Surgeon: [name], MD;  Location: [facility];  Service: Endoscopy;  Laterality: N/A;  . Esophagogastroduodenoscopy N/A 10/03/2014    Procedure: ESOPHAGOGASTRODUODENOSCOPY (EGD);  Surgeon: [name], MD;  Location: [facility] ENDOSCOPY;  Service: Endoscopy;  Laterality: N/A;  . Colonoscopy N/A  10/03/2014    Procedure: COLONOSCOPY;  Surgeon: [name], MD;  Location: [facility];  Service: Endoscopy;  Laterality: N/A;    There were no vitals filed for this visit.      Subjective Assessment - 05/27/16 0948    Subjective  Pt reports she is having an MRI of her neck next week   Pertinent History Newly diagnosed Parkinsonism (? PSP); hx of multiple falls; hx of L breast CA (treated with chemo, radiation, mastectomy/reconstruction 2004); heart valve replacement; PVC's; HTN; macular degeneration   Patient Stated Goals improve writing   Currently in Pain? Yes   Pain Score 6    Pain Location Neck   Pain Orientation Left   Pain Descriptors / Indicators Sore   Pain Type Chronic pain   Pain Onset More than a month ago   Aggravating Factors  turning   Pain Relieving Factors rest   Multiple Pain Sites No               Fine motor coordination activities: Reviewed flipping and dealing cards with big movements and picking up /stacking coins from HEP, mod difficulty / v.c for RUE, occasional min difficulty with LUE.Marland Kitchen Rotating, tossing ball  With larger amplitude moveents, min-mod v.c.  PWR [facility]) - 05/27/16 1059    PWR! exercises Moves in sitting   PWR! Up x20   PWR! Rock x10   PWR! Twist x10   PWR! Step x10   Comments did not issue as HEP as pt experienced discomfort with rock and twist at neck                OT Short Term Goals - 05/13/16 1641    OT SHORT TERM GOAL #1   Title Pt will be independent with PD-specific HEP.--check STGs 06/12/16   Time 4   Period Weeks   Status New   OT SHORT TERM GOAL #2   Title Pt will report improved ability to cut food and decr spills when eating.   Time 4   Period Weeks   Status New   OT SHORT TERM GOAL #3   Title Pt will report incr ease with donning shoes and perform in reasonable amount of time.   Time 4   Period Weeks   Status New   OT SHORT TERM GOAL #4   Title Pt will report incr ease with  fastening bra and buttons.   Time 4   Period Weeks   Status New   OT SHORT TERM GOAL #5   Title Pt will report incr ease turning in bed using large amplitude movement strategies.   Time 4   Period Weeks   Status New   OT SHORT TERM GOAL #6   Title Pt will write at least 3 sentences with at least 90% legibility and only min decr in size.   Baseline legibility 50% cursive, 75% print     Time 4   Period Weeks   Status New           OT Long Term Goals - 05/13/16 1646    OT LONG TERM GOAL #1   Title Pt will verbalize understanding of adaptive strategies/AE for ADLs/IADLs prn.--check LTGs 06/12/16   Time 8   Period Weeks   Status New   OT LONG TERM GOAL #2   Title Pt will improve coordination for ADLs as shown by reducing time on 9-hole peg test by at least 5 sec with RUE.   Baseline  32.50   Time 8   Period Weeks   Status New   OT LONG TERM GOAL #3   Title Pt will improve coordination/functional reaching as shown by improving box and blocks test by at least 5 bilaterally.   Baseline R-44, L-40 blocks   Time 8   Period Weeks   Status New   OT LONG TERM GOAL #4   Title Pt will improve functional reaching/balance for IADLs as shown by improving standing functional reach by at least 2" bilaterally.   Baseline 8" bilaterally   Time 8   Period Weeks   Status New   OT LONG TERM GOAL #5   Title Pt will write at least 3 sentences with at least 90% legibility and only min decr in size.   Baseline legibility 50% cursive, 75% print, mod micrographia   Time 8   Period Weeks   Status New   OT LONG TERM GOAL #6   Title Pt will verbalize understanding of ways to prevent future complications and appropriate community resources prn.   Time 8   Period Weeks   Status New               Plan - 05/27/16 0955    Clinical Impression Statement Pt  is progressing towards goals. She is experiencing neck pain which limits her ability to perform PD specific exercises.   Clinical  Impairments Affecting Rehab Potential hx of falls, possible PSP   OT Frequency 2x / week   OT Duration 8 weeks   OT Treatment/Interventions Self-care/ADL training;Therapeutic exercise;Functional Mobility Training;Patient/family education;Balance training;Manual Therapy;Neuromuscular education;Ultrasound;Energy conservation;Therapeutic exercises;Cryotherapy;Parrafin;DME and/or AE instruction;Therapeutic activities;Cognitive remediation/compensation;Fluidtherapy;Electrical Stimulation;Moist Heat;Passive range of motion;Visual/perceptual remediation/compensation   Plan big movements with ADLs, add rotating ball to HEP.   Consulted and Agree with Plan of Care Patient      Patient will benefit from skilled therapeutic intervention in order to improve the following deficits and impairments:     Visit Diagnosis: Other symptoms and signs involving the nervous system  Visuospatial deficit  Other lack of coordination    Problem List Patient Active Problem List   Diagnosis Date Noted  . Family history of colon cancer 07/30/2014  . Hiatal hernia 07/30/2014  . Chest pain 10/13/2011  . Disequilibrium 10/13/2011  . Heart valve replaced by other means 04/16/2011  . Anticoagulant long-term use 03/17/2011  . S/P AVR (aortic valve replacement)   . PVC's (premature ventricular contractions)   . HTN (hypertension)   . Hypercholesteremia   . A-fib ([place])   . GERD (gastroesophageal reflux disease)   . GERD 02/06/2010  . DIVERTICULOSIS-COLON 02/06/2010  . PERSONAL HX COLONIC POLYPS 02/06/2010  . MASTECTOMY, HX OF 02/06/2010    Sherron Mapp 05/27/2016, 12:50 PM Theone Murdoch, OTR/L [phone] Phone: [phone] 12:50 PM 05/27/2016 [facility] [address] [place], [place], [address] Phone: [phone]   Fax:  [phone]  Name: [name] MRN: [id number] Date of Birth: 02-17-1941

## 2016-05-28 ENCOUNTER — Encounter: Payer: Self-pay | Admitting: Rehabilitative and Restorative Service Providers"

## 2016-05-28 DIAGNOSIS — R2689 Other abnormalities of gait and mobility: Secondary | ICD-10-CM | POA: Diagnosis not present

## 2016-05-28 NOTE — Therapy (Signed)
[facility] [address] [place], [place], [address] Phone: [phone]   Fax:  [phone]  Patient Details  Name: [name] MRN: [id number] Date of Birth: 02/06/1941 Referring Provider:  No ref. provider found  Encounter Date: last encounter 05/27/2016  PHYSICAL THERAPY DISCHARGE SUMMARY  Visits from Start of Care: 15  Current functional level related to goals / functional outcomes:     PT Short Term Goals - 04/27/16 0815    PT SHORT TERM GOAL #1   Title The patient will be able to perform HEP with assist from her husband.   Baseline Reviewed with verbal cues on 04/27/2016   Time 4   Period Weeks   Status Achieved   PT SHORT TERM GOAL #2   Title The patient will improve gait speed from 2.45 ft/sec to > or equal to 2.7 ft/sec to demo transition to "full community ambulator" classification of gait.   Baseline improved to 2.83 ft/sec on 04/27/2016   Time 4   Period Weeks   Status Achieved   PT SHORT TERM GOAL #3   Title The patient will improve Berg score from 35/56 to > or equal to 40/56 to demo decreased fall risk.   Baseline Met on 04/27/2016   Time 4   Period Weeks   Status Achieved   PT SHORT TERM GOAL #4   Title The patient will perform sit<>stand without UE support x 5 repetitions from chair without assistance.   Baseline Met on 04/20/2016   Time 4   Period Weeks   Status Achieved   PT SHORT TERM GOAL #5   Title The patient will move floor<>stand with UE support modified indep.   Baseline Met on 04/27/2016   Time 4   Period Weeks   Status Achieved         PT Long Term Goals - 05/27/16 0913    PT LONG TERM GOAL #1   Title The patient will improve activities of balance confidence score from 41.9% to > or equal to 55%.   Baseline Target date: 05/29/2016   Time 8   Period Weeks   Status Deferred   PT LONG TERM GOAL #2   Title The patient will improve Berg from 35/56 to > or equal to 42/56 to demo  decreasing risk for falls.   Baseline Improved to 45/56.   Time 8   Period Weeks   Status Achieved   PT LONG TERM GOAL #3   Title The patient will reduce TUG score from 16.66 seconds to < or equal to 13.5 seconds to demo decreasing risk for falls.   Baseline Met on 05/11/2016 scoring 11.65 seconds without a device indep   Time 8   Period Weeks   Status Achieved   PT LONG TERM GOAL #4   Title The patient will negotiate indoor surfaces without a devide independently evidenced by no loss of balance on level surfaces x 300 feet.   Baseline Met on 05/12/2016 however demo's inconsistentency with ths activity.   Time 8   Period Weeks   Status Achieved   PT LONG TERM GOAL #5   Title The patient will move sit<>stand x 5 reps in < or equal to 10 seconds (reduced from 12.53 at baseline).   Baseline Met on 05/11/2016 with 9.50 seconds   Time 8   Period Weeks   Status Achieved   PT LONG TERM GOAL #6   Title Continue to progress HEP for post  d/c plan.   Baseline Met on 05/27/2016   Status Achieved       Remaining deficits: Fall risk due to variability in gait pattern, dec'd high level balance Neck pain-being further worked up by MD Abnormality of gait Unsteadiness   Education / Equipment: HEP with assist from husband, walking program, community resources.   Plan: Patient agrees to discharge.  Patient goals were met. Patient is being discharged due to meeting the stated rehab goals.  ?????       Thank you for the referral of this patient. Rudell Cobb, MPT  Ryliegh Mcduffey 05/28/2016, 3:28 PM  [facility] [address] [place], [place], [address] Phone: [phone]   Fax:  [phone]

## 2016-06-01 ENCOUNTER — Ambulatory Visit
Admission: RE | Admit: 2016-06-01 | Discharge: 2016-06-01 | Disposition: A | Discharge status: Home / Self Care | Payer: PPO | Source: Ambulatory Visit

## 2016-06-01 ENCOUNTER — Other Ambulatory Visit: Payer: Self-pay | Admitting: Obstetrics and Gynecology

## 2016-06-01 ENCOUNTER — Encounter: Payer: PPO | Admitting: Occupational Therapy

## 2016-06-01 ENCOUNTER — Ambulatory Visit: Payer: PPO | Admitting: Rehabilitative and Restorative Service Providers"

## 2016-06-01 DIAGNOSIS — Z1231 Encounter for screening mammogram for malignant neoplasm of breast: Secondary | ICD-10-CM

## 2016-06-01 DIAGNOSIS — Z7901 Long term (current) use of anticoagulants: Secondary | ICD-10-CM | POA: Diagnosis not present

## 2016-06-01 DIAGNOSIS — N390 Urinary tract infection, site not specified: Secondary | ICD-10-CM | POA: Diagnosis not present

## 2016-06-01 DIAGNOSIS — R8299 Other abnormal findings in urine: Secondary | ICD-10-CM | POA: Diagnosis not present

## 2016-06-03 ENCOUNTER — Ambulatory Visit: Payer: PPO | Admitting: Rehabilitative and Restorative Service Providers"

## 2016-06-03 ENCOUNTER — Ambulatory Visit
Admission: RE | Admit: 2016-06-03 | Discharge: 2016-06-03 | Disposition: A | Discharge status: Home / Self Care | Payer: PPO | Source: Ambulatory Visit | Attending: Internal Medicine | Admitting: Internal Medicine

## 2016-06-03 DIAGNOSIS — M50221 Other cervical disc displacement at C4-C5 level: Secondary | ICD-10-CM | POA: Diagnosis not present

## 2016-06-03 DIAGNOSIS — M542 Cervicalgia: Secondary | ICD-10-CM

## 2016-06-03 DIAGNOSIS — M50223 Other cervical disc displacement at C6-C7 level: Secondary | ICD-10-CM | POA: Diagnosis not present

## 2016-06-03 DIAGNOSIS — M50222 Other cervical disc displacement at C5-C6 level: Secondary | ICD-10-CM | POA: Diagnosis not present

## 2016-06-04 ENCOUNTER — Other Ambulatory Visit: Payer: Self-pay | Admitting: Internal Medicine

## 2016-06-04 DIAGNOSIS — M542 Cervicalgia: Secondary | ICD-10-CM

## 2016-06-08 ENCOUNTER — Ambulatory Visit: Payer: PPO | Admitting: Occupational Therapy

## 2016-06-10 ENCOUNTER — Ambulatory Visit: Payer: PPO | Admitting: Occupational Therapy

## 2016-06-10 DIAGNOSIS — R278 Other lack of coordination: Secondary | ICD-10-CM

## 2016-06-10 DIAGNOSIS — R2689 Other abnormalities of gait and mobility: Secondary | ICD-10-CM | POA: Diagnosis not present

## 2016-06-10 DIAGNOSIS — R29818 Other symptoms and signs involving the nervous system: Secondary | ICD-10-CM

## 2016-06-10 DIAGNOSIS — R41842 Visuospatial deficit: Secondary | ICD-10-CM

## 2016-06-10 NOTE — Patient Instructions (Signed)
Coordination Exercises  Perform the following exercises for 20 minutes 1 times per day. Perform with both hand(s). Perform using big movements.  1. Flipping Cards: Place deck of cards on the table. Flip cards over by opening your hand big to grasp and then turn your palm up big. 2. Deal cards: Hold 1/2 or whole deck in your hand. Use thumb to push card off top of deck with one big push. 3. Pick up coins and place in coin bank or container: Pick up with big, intentional movements. Do not drag coin to the edge. 4. Pick up coins and stack one at a time: Pick up with big, intentional movements. Do not drag coin to the edge. (5-10 in a stack) 5. Pick up 5-10 coins one at a time and hold in palm. Then, move coins from palm to fingertips one at time and place in coin bank/container. 6.   Rotate ball with fingertips: Pick up with fingers/thumb and move as much as you can with each turn/movement (clockwise and counter-clockwise). 7.  Toss ball in the air and catch with the same hand: Toss big/high.  Deliberately open with toss and deliberately close hand after catch and STOP before tossing again. 8.  Place card on tabletop.  Then flick fingers (extend fingers) powerfully to slide card off table (can have chair/box below table to catch the cards). 9.  Fasten nuts/bolts or put on bottle caps: Turn as much/as big as you can with each turn.  Move wrist. 10.  Practice writing: Slow down, write big, and focus on forming each letter.   PWR! Hands  With arms stretched out in front of you (elbows straight), perform the following:  PWR! Rock: Move wrists up and down [employer]! Twist: Twist palms up and down BIG  Then, start with elbows bent and hands closed.  PWR! Step: Touch index finger to thumb while keeping other fingers straight. Flick fingers out BIG (thumb out/straighten fingers). Repeat with other fingers. (Step your thumb to each finger).  PWR! Hands: Push hands out BIG. Elbows straight, wrists  up, fingers open and spread apart BIG. (Can also perform by pushing down on table, chair, knees. Push above head, out to the side, behind you, in front of you.)   ** Make each movement big and deliberate so that you feel the movement.  Perform at least 10 repetitions 1x/day, but perform PWR! hands throughout the day when you are having trouble using your hands (picking up/manipulating small objects, writing, eating, typing, sewing, buttoning, etc.).

## 2016-06-10 NOTE — Therapy (Signed)
[facility] [address] [place], [place], [address] Phone: [phone]   Fax:  [phone]  Occupational Therapy Treatment  Patient Details  Name: [name] MRN: [id number] Date of Birth: 07/30/1941 Referring Provider: Dr. Wells Guiles Tat  Encounter Date: 06/10/2016      OT End of Session - 06/10/16 0813    Visit Number 4   Number of Visits 17   Date for OT Re-Evaluation 07/12/16   Authorization Type [employer]; no visit limit or auth; G-code   Authorization - Visit Number 4   Authorization - Number of Visits 10   OT Start Time 0805   OT Stop Time 0845   OT Time Calculation (min) 40 min   Activity Tolerance Patient tolerated treatment well   Behavior During Therapy [facility] for tasks assessed/performed      Past Medical History  Diagnosis Date  . Aortic stenosis, severe   . HTN (hypertension)   . Hypercholesteremia   . PVC's (premature ventricular contractions)   . A-fib (HCC)     paroxysmal  . Venous varices    . GERD (gastroesophageal reflux disease)   . Breast CA ([place]) 2004  . Chest pain   . Herpes zoster infection     History of herpes zoster infection  . Hypercholesterolemia   . Obesity   . History of kidney stones   . History of bronchitis   . Mucositis     Severe mucositis  . Irritable bowel syndrome   . History of hiatal hernia   . PONV (postoperative nausea and vomiting)   . Hemifacial spasm     Past Surgical History  Procedure Laterality Date  . Aortic valve replacement  04/13/06    St. Jude mechanical conduit  . Total abdominal hysterectomy    . Cystocele repair    . Rectocele repair    . Appendectomy    . Kidney stone surgery    . Foot surgery    . Cataract surgery      bilateral  . Esophageal manometry N/A 08/12/2014    Procedure: ESOPHAGEAL MANOMETRY (EM);  Surgeon: [name], MD;  Location: [facility];  Service: Endoscopy;  Laterality: N/A;  . Esophagogastroduodenoscopy  N/A 10/03/2014    Procedure: ESOPHAGOGASTRODUODENOSCOPY (EGD);  Surgeon: [name], MD;  Location: [facility] ENDOSCOPY;  Service: Endoscopy;  Laterality: N/A;  . Colonoscopy N/A 10/03/2014    Procedure: COLONOSCOPY;  Surgeon: [name], MD;  Location: [facility];  Service: Endoscopy;  Laterality: N/A;    There were no vitals filed for this visit.      Subjective Assessment - 06/10/16 0807    Subjective  Pt reports that MRI showed deteriorated disc and arthritis in neck.  May have a cortisone injection.   Pertinent History Newly diagnosed Parkinsonism (? PSP); hx of multiple falls; hx of L breast CA (treated with chemo, radiation, mastectomy/reconstruction 2004); heart valve replacement; PVC's; HTN; macular degeneration   Patient Stated Goals improve writing   Currently in Pain? Yes   Pain Score 4    Pain Location Neck   Pain Orientation Left   Pain Descriptors / Indicators Sore   Pain Type Chronic pain   Pain Onset More than a month ago   Pain Frequency Constant   Aggravating Factors  turning   Pain Relieving Factors rest                    Instructed pt in updated coordination HEP and  PWR! hands--see pt instructions.    Self Care:    Writing:  Practiced writing name/address with focus/min cues on letter formation/size.  Pt with mod decr in size the more that he wrote with approx. 75% legibility.  Trial of foam built-up grip with incr ease per pt report--will assess further next session.             OT Education - 06/10/16 0827    Education Details Updated coordination HEP; PWR! hands (basic 4); how PD affects movement and how PD-specific exercises address deficits (and for prevention of future complications)   Person(s) Educated Patient   Methods Explanation;Demonstration;Verbal cues;Handout   Comprehension Returned demonstration;Verbalized understanding;Verbal cues required          OT Short Term Goals - 05/13/16 1641    OT SHORT TERM GOAL #1    Title Pt will be independent with PD-specific HEP.--check STGs 06/12/16   Time 4   Period Weeks   Status New   OT SHORT TERM GOAL #2   Title Pt will report improved ability to cut food and decr spills when eating.   Time 4   Period Weeks   Status New   OT SHORT TERM GOAL #3   Title Pt will report incr ease with donning shoes and perform in reasonable amount of time.   Time 4   Period Weeks   Status New   OT SHORT TERM GOAL #4   Title Pt will report incr ease with fastening bra and buttons.   Time 4   Period Weeks   Status New   OT SHORT TERM GOAL #5   Title Pt will report incr ease turning in bed using large amplitude movement strategies.   Time 4   Period Weeks   Status New   OT SHORT TERM GOAL #6   Title Pt will write at least 3 sentences with at least 90% legibility and only min decr in size.   Baseline legibility 50% cursive, 75% print     Time 4   Period Weeks   Status New           OT Long Term Goals - 05/13/16 1646    OT LONG TERM GOAL #1   Title Pt will verbalize understanding of adaptive strategies/AE for ADLs/IADLs prn.--check LTGs 06/12/16   Time 8   Period Weeks   Status New   OT LONG TERM GOAL #2   Title Pt will improve coordination for ADLs as shown by reducing time on 9-hole peg test by at least 5 sec with RUE.   Baseline  32.50   Time 8   Period Weeks   Status New   OT LONG TERM GOAL #3   Title Pt will improve coordination/functional reaching as shown by improving box and blocks test by at least 5 bilaterally.   Baseline R-44, L-40 blocks   Time 8   Period Weeks   Status New   OT LONG TERM GOAL #4   Title Pt will improve functional reaching/balance for IADLs as shown by improving standing functional reach by at least 2" bilaterally.   Baseline 8" bilaterally   Time 8   Period Weeks   Status New   OT LONG TERM GOAL #5   Title Pt will write at least 3 sentences with at least 90% legibility and only min decr in size.   Baseline legibility  50% cursive, 75% print, mod micrographia   Time 8   Period Weeks   Status New  OT LONG TERM GOAL #6   Title Pt will verbalize understanding of ways to prevent future complications and appropriate community resources prn.   Time 8   Period Weeks   Status New               Plan - 06/10/16 0831    Clinical Impression Statement Pt is progressing towards goals.  Pt needs cueing for timing for functional tasks.   Rehab Potential Good   Clinical Impairments Affecting Rehab Potential hx of falls, possible PSP   OT Frequency 2x / week   OT Duration 8 weeks   Plan big movements with ADLs, review updated coordination HEP, writing (issue foam grip)   OT Home Exercise Plan Education provided:  coordination HEP with focus on timing and big movements, PWR! hands (basic 4), initiated prevention of future complications   Consulted and Agree with Plan of Care Patient      Patient will benefit from skilled therapeutic intervention in order to improve the following deficits and impairments:  Abnormal gait, Decreased coordination, Decreased range of motion, Difficulty walking, Impaired flexibility, Increased edema, Decreased safety awareness, Decreased endurance, Decreased activity tolerance, Decreased knowledge of precautions, Impaired tone, Pain, Impaired UE functional use, Decreased scar mobility, Decreased knowledge of use of DME, Decreased balance, Decreased cognition, Decreased mobility, Decreased strength  Visit Diagnosis: Other symptoms and signs involving the nervous system  Visuospatial deficit  Other lack of coordination    Problem List Patient Active Problem List   Diagnosis Date Noted  . Family history of colon cancer 07/30/2014  . Hiatal hernia 07/30/2014  . Chest pain 10/13/2011  . Disequilibrium 10/13/2011  . Heart valve replaced by other means 04/16/2011  . Anticoagulant long-term use 03/17/2011  . S/P AVR (aortic valve replacement)   . PVC's (premature ventricular  contractions)   . HTN (hypertension)   . Hypercholesteremia   . A-fib ([place])   . GERD (gastroesophageal reflux disease)   . GERD 02/06/2010  . DIVERTICULOSIS-COLON 02/06/2010  . PERSONAL HX COLONIC POLYPS 02/06/2010  . MASTECTOMY, HX OF 02/06/2010    [facility] 06/10/2016, 9:04 AM  [facility] [address] [place], [place], [address] Phone: [phone]   Fax:  [phone]  Name: [name] MRN: [id number] Date of Birth: [birth year]  Vianne Bulls, OTR/L [facility] [address] [place], [place]  [address] [phone] phone [phone] 06/10/2016 9:04 AM

## 2016-06-15 ENCOUNTER — Ambulatory Visit: Payer: PPO | Admitting: Occupational Therapy

## 2016-06-15 ENCOUNTER — Encounter: Payer: PPO | Admitting: Occupational Therapy

## 2016-06-15 DIAGNOSIS — R29818 Other symptoms and signs involving the nervous system: Secondary | ICD-10-CM

## 2016-06-15 DIAGNOSIS — R2681 Unsteadiness on feet: Secondary | ICD-10-CM

## 2016-06-15 DIAGNOSIS — R41842 Visuospatial deficit: Secondary | ICD-10-CM

## 2016-06-15 DIAGNOSIS — R2689 Other abnormalities of gait and mobility: Secondary | ICD-10-CM

## 2016-06-15 DIAGNOSIS — R278 Other lack of coordination: Secondary | ICD-10-CM

## 2016-06-15 NOTE — Therapy (Signed)
[facility] [address] [place], [place], [address] Phone: [phone]   Fax:  [phone]  Occupational Therapy Treatment  Patient Details  Name: [name] MRN: [id number] Date of Birth: [birth year] Referring Provider: Dr. Wells Guiles Tat  Encounter Date: 06/15/2016      OT End of Session - 06/15/16 1024    Visit Number 5   Number of Visits 17   Date for OT Re-Evaluation 07/12/16   Authorization Type [employer]; no visit limit or auth; G-code   Authorization - Visit Number 5   Authorization - Number of Visits 10   OT Start Time 1017   OT Stop Time 1100   OT Time Calculation (min) 43 min   Activity Tolerance Patient tolerated treatment well   Behavior During Therapy [facility] for tasks assessed/performed      Past Medical History  Diagnosis Date  . Aortic stenosis, severe   . HTN (hypertension)   . Hypercholesteremia   . PVC's (premature ventricular contractions)   . A-fib (HCC)     paroxysmal  . Venous varices    . GERD (gastroesophageal reflux disease)   . Breast CA ([place]) 2004  . Chest pain   . Herpes zoster infection     History of herpes zoster infection  . Hypercholesterolemia   . Obesity   . History of kidney stones   . History of bronchitis   . Mucositis     Severe mucositis  . Irritable bowel syndrome   . History of hiatal hernia   . PONV (postoperative nausea and vomiting)   . Hemifacial spasm     Past Surgical History  Procedure Laterality Date  . Aortic valve replacement  04/13/06    St. Jude mechanical conduit  . Total abdominal hysterectomy    . Cystocele repair    . Rectocele repair    . Appendectomy    . Kidney stone surgery    . Foot surgery    . Cataract surgery      bilateral  . Esophageal manometry N/A 08/12/2014    Procedure: ESOPHAGEAL MANOMETRY (EM);  Surgeon: [name], MD;  Location: [facility];  Service: Endoscopy;  Laterality: N/A;  . Esophagogastroduodenoscopy  N/A 10/03/2014    Procedure: ESOPHAGOGASTRODUODENOSCOPY (EGD);  Surgeon: [name], MD;  Location: [facility] ENDOSCOPY;  Service: Endoscopy;  Laterality: N/A;  . Colonoscopy N/A 10/03/2014    Procedure: COLONOSCOPY;  Surgeon: [name], MD;  Location: [place];  Service: Endoscopy;  Laterality: N/A;    There were no vitals filed for this visit.      Subjective Assessment - 06/15/16 1022    Subjective  Pt reports that she is getting a cortisone injection in neck 06/25/16   Pertinent History Newly diagnosed Parkinsonism (? PSP); hx of multiple falls; hx of L breast CA (treated with chemo, radiation, mastectomy/reconstruction 2004); heart valve replacement; PVC's; HTN; macular degeneration   Limitations fall risk   Patient Stated Goals improve writing   Currently in Pain? Yes   Pain Score 4    Pain Location Neck   Pain Orientation Left   Pain Descriptors / Indicators Sore   Pain Type Chronic pain   Pain Onset More than a month ago   Pain Frequency Constant   Aggravating Factors  turning   Pain Relieving Factors rest        Self Care:   Writing:  Practiced copying sentences with built-up pens with focus/mod cues on letter formation/size and  timing (slowing down).  Pt with 75% legibility (mod decr in size) with cursive and 90% legibility printing with cues (occasional min decr in size).  Practiced writing name/address with inconsistent performance but improved with cueing to slow down.  Practiced continuous "L" with focus/min cues for large amplitude/spacing with min difficulty with size at end of line.   Neuro re-ed:  PWR! Hands (basic 4) x 10 each with min cues For incr movement amplitude.  Sliding cards across table with PWR! Hands, min-mod cues for large amplitude/powerful finger ext.  Pt with mod difficulty today with each hand.                             OT Education - 06/15/16 1025    Education Details PD Handwriting Tips (emphasized slowing  down, writing big, and saying each letter in head as she writies it); Timing difficulties with PSP and ways to address within functional tasks/writing   Person(s) Educated Patient   Methods Explanation;Verbal cues;Handout   Comprehension Verbalized understanding;Returned demonstration          OT Short Term Goals - 06/15/16 1842    OT SHORT TERM GOAL #1   Title Pt will be independent with PD-specific HEP.--check STGs 06/12/16   Time 4   Period Weeks   Status On-going   OT SHORT TERM GOAL #2   Title Pt will report improved ability to cut food and decr spills when eating.   Time 4   Period Weeks   Status New   OT SHORT TERM GOAL #3   Title Pt will report incr ease with donning shoes and perform in reasonable amount of time.   Time 4   Period Weeks   Status New   OT SHORT TERM GOAL #4   Title Pt will report incr ease with fastening bra and buttons.   Time 4   Period Weeks   Status New   OT SHORT TERM GOAL #5   Title Pt will report incr ease turning in bed using large amplitude movement strategies.   Time 4   Period Weeks   Status New   OT SHORT TERM GOAL #6   Title Pt will write at least 3 sentences with at least 90% legibility and only min decr in size.   Baseline legibility 50% cursive, 75% print     Time 4   Period Weeks   Status Achieved  06/15/16:  75% cursive, 90% print with only min decr in size (recommended printing)           OT Long Term Goals - 05/13/16 1646    OT LONG TERM GOAL #1   Title Pt will verbalize understanding of adaptive strategies/AE for ADLs/IADLs prn.--check LTGs 06/12/16   Time 8   Period Weeks   Status New   OT LONG TERM GOAL #2   Title Pt will improve coordination for ADLs as shown by reducing time on 9-hole peg test by at least 5 sec with RUE.   Baseline  32.50   Time 8   Period Weeks   Status New   OT LONG TERM GOAL #3   Title Pt will improve coordination/functional reaching as shown by improving box and blocks test by at least 5  bilaterally.   Baseline R-44, L-40 blocks   Time 8   Period Weeks   Status New   OT LONG TERM GOAL #4   Title Pt will improve functional reaching/balance  for IADLs as shown by improving standing functional reach by at least 2" bilaterally.   Baseline 8" bilaterally   Time 8   Period Weeks   Status New   OT LONG TERM GOAL #5   Title Pt will write at least 3 sentences with at least 90% legibility and only min decr in size.   Baseline legibility 50% cursive, 75% print, mod micrographia   Time 8   Period Weeks   Status New   OT LONG TERM GOAL #6   Title Pt will verbalize understanding of ways to prevent future complications and appropriate community resources prn.   Time 8   Period Weeks   Status New               Plan - 06/15/16 1025    Clinical Impression Statement Pt is progressing towards goals, but impaired timing affects functional tasks and incr hypokinesia.   Rehab Potential Good   Clinical Impairments Affecting Rehab Potential hx of falls, possible PSP   OT Frequency 2x / week   OT Duration 8 weeks   Plan continue checking/addressing STGs, big movements with ADLs, coordination, timing of movement   OT Home Exercise Plan Education provided:  coordination HEP with focus on timing and big movements, PWR! hands (basic 4), initiated prevention of future complications   Consulted and Agree with Plan of Care Patient      Patient will benefit from skilled therapeutic intervention in order to improve the following deficits and impairments:  Abnormal gait, Decreased coordination, Decreased range of motion, Difficulty walking, Impaired flexibility, Increased edema, Decreased safety awareness, Decreased endurance, Decreased activity tolerance, Decreased knowledge of precautions, Impaired tone, Pain, Impaired UE functional use, Decreased scar mobility, Decreased knowledge of use of DME, Decreased balance, Decreased cognition, Decreased mobility, Decreased strength  Visit  Diagnosis: Other symptoms and signs involving the nervous system  Other lack of coordination  Other abnormalities of gait and mobility  Unsteadiness on feet  Visuospatial deficit    Problem List Patient Active Problem List   Diagnosis Date Noted  . Family history of colon cancer 07/30/2014  . Hiatal hernia 07/30/2014  . Chest pain 10/13/2011  . Disequilibrium 10/13/2011  . Heart valve replaced by other means 04/16/2011  . Anticoagulant long-term use 03/17/2011  . S/P AVR (aortic valve replacement)   . PVC's (premature ventricular contractions)   . HTN (hypertension)   . Hypercholesteremia   . A-fib ([place])   . GERD (gastroesophageal reflux disease)   . GERD 02/06/2010  . DIVERTICULOSIS-COLON 02/06/2010  . PERSONAL HX COLONIC POLYPS 02/06/2010  . MASTECTOMY, HX OF 02/06/2010    [facility] 06/15/2016, 6:46 PM  [facility] [address] [place], [place], [address] Phone: [phone]   Fax:  [phone]  Name: [name] MRN: [id number] Date of Birth: 08-17-1941  Vianne Bulls, OTR/L [facility] [address] [place], [place]  [address] [phone] phone [phone] 06/15/2016 6:46 PM

## 2016-06-17 ENCOUNTER — Ambulatory Visit: Payer: PPO | Admitting: Occupational Therapy

## 2016-06-17 DIAGNOSIS — Z7901 Long term (current) use of anticoagulants: Secondary | ICD-10-CM | POA: Diagnosis not present

## 2016-06-17 DIAGNOSIS — R2681 Unsteadiness on feet: Secondary | ICD-10-CM

## 2016-06-17 DIAGNOSIS — R29818 Other symptoms and signs involving the nervous system: Secondary | ICD-10-CM

## 2016-06-17 DIAGNOSIS — R41842 Visuospatial deficit: Secondary | ICD-10-CM

## 2016-06-17 DIAGNOSIS — I358 Other nonrheumatic aortic valve disorders: Secondary | ICD-10-CM | POA: Diagnosis not present

## 2016-06-17 DIAGNOSIS — R2689 Other abnormalities of gait and mobility: Secondary | ICD-10-CM | POA: Diagnosis not present

## 2016-06-17 DIAGNOSIS — R278 Other lack of coordination: Secondary | ICD-10-CM

## 2016-06-17 NOTE — Patient Instructions (Addendum)
Performing Daily Activities with Big Movements  Pick at least one activity a day and perform with BIG, DELIBERATE movements/effort. This can make the activity easier and turn daily activities into exercise!  If you are standing during the activity, make sure to keep feet apart and stand with good/big/PWR! UP posture.  Examples:  Dressing - Push arms in sleeves, twist when putting on jacket, push foot into pants, open hands to pull down shirt/put on socks/pull up pants  Buttoning - Open hands big (PWR! Hands) before fastening each button  Bathing - Wash/dry with long strokes  Brushing your teeth - Big, slow movements  Cutting food - Long deliberate cuts  Eating - Hold utensil in the middle, not the end  Picking up a cup/bottle - Open hand up big and get object all the way in palm  Opening jar/bottle - Move as much as you can with each turn  Putting on seatbelt - Twist when reaching  Hanging up clothes/getting clothes down from closet - Reach with big effort  Putting away groceries/dishes - Reach with big effort  Wiping counter/table - Move in big, long strokes  Stirring while cooking - Exaggerate movement  Cleaning windows - Move in big, long strokes  Sweeping - Move arms in big, long strokes  Vacuuming - Push with big movement  Folding clothes - Exaggerate arm movements  Washing car - Move in big, long strokes  Raking - Move arms in big, long strokes  Changing light bulb - Move as much as you can with each turn  Using a screwdriver - Move as much as you can with each turn  Walking into a store/restaurant - Walk with big steps, swing arms if able  Standing up from a chair/recliner/sofa - Scoot forward, lean forward, and stand with big effort

## 2016-06-17 NOTE — Therapy (Signed)
[facility] [address] [place], [place], [address] Phone: [phone]   Fax:  [phone]  Occupational Therapy Treatment  Patient Details  Name: [name] MRN: [id number] Date of Birth: 01-20-1941 Referring Provider: Dr. Wells Guiles Tat  Encounter Date: 06/17/2016      OT End of Session - 06/17/16 0811    Visit Number 6   Number of Visits 17   Date for OT Re-Evaluation 07/12/16   Authorization Type [employer]; no visit limit or auth; G-code   Authorization - Visit Number 6   Authorization - Number of Visits 10   OT Start Time [phone]   OT Stop Time 0845   OT Time Calculation (min) 39 min   Activity Tolerance Patient tolerated treatment well   Behavior During Therapy [facility] for tasks assessed/performed      Past Medical History  Diagnosis Date  . Aortic stenosis, severe   . HTN (hypertension)   . Hypercholesteremia   . PVC's (premature ventricular contractions)   . A-fib (HCC)     paroxysmal  . Venous varices    . GERD (gastroesophageal reflux disease)   . Breast CA ([place]) 2004  . Chest pain   . Herpes zoster infection     History of herpes zoster infection  . Hypercholesterolemia   . Obesity   . History of kidney stones   . History of bronchitis   . Mucositis     Severe mucositis  . Irritable bowel syndrome   . History of hiatal hernia   . PONV (postoperative nausea and vomiting)   . Hemifacial spasm     Past Surgical History  Procedure Laterality Date  . Aortic valve replacement  04/13/06    St. Jude mechanical conduit  . Total abdominal hysterectomy    . Cystocele repair    . Rectocele repair    . Appendectomy    . Kidney stone surgery    . Foot surgery    . Cataract surgery      bilateral  . Esophageal manometry N/A 08/12/2014    Procedure: ESOPHAGEAL MANOMETRY (EM);  Surgeon: [name], MD;  Location: [facility]OS[facility];  Service: Endoscopy;  Laterality: N/A;  . Esophagogastroduodenoscopy  N/A 10/03/2014    Procedure: ESOPHAGOGASTRODUODENOSCOPY (EGD);  Surgeon: [name], MD;  Location: [facility] ENDOSCOPY;  Service: Endoscopy;  Laterality: N/A;  . Colonoscopy N/A 10/03/2014    Procedure: COLONOSCOPY;  Surgeon: [name], MD;  Location: [facility];  Service: Endoscopy;  Laterality: N/A;    There were no vitals filed for this visit.      Subjective Assessment - 06/17/16 0810    Subjective  Pt reports mild dizziness this morning   Pertinent History Newly diagnosed Parkinsonism (? PSP); hx of multiple falls; hx of L breast CA (treated with chemo, radiation, mastectomy/reconstruction 2004); heart valve replacement; PVC's; HTN; macular degeneration   Limitations fall risk   Patient Stated Goals improve writing   Currently in Pain? Yes   Pain Score 4    Pain Location Neck   Pain Orientation Left   Pain Descriptors / Indicators Sore   Pain Type Chronic pain   Pain Onset More than a month ago   Pain Frequency Constant   Aggravating Factors  turning   Pain Relieving Factors rest        Neuro re-ed:  PWR! Moves (basic 4) in supine x 10 each with min cues For incr movement amplitude, timing, and coordination.  PWR! Up  in sitting x10 with min cues for incr movement amplitude.   Coordination:  Picking up and stacking coins with each hand and then using in-hand manipulation to place in coin bank one at a time with min difficulty, improved with repetition and min cues to slow down.                          OT Education - 06/17/16 0907    Education Details Strategies for ADLs (incorporating large amplitude movements) for incr ease/prevention of future complications   Person(s) Educated Patient   Methods Handout;Explanation;Demonstration   Comprehension Verbalized understanding          OT Short Term Goals - 06/17/16 0829    OT SHORT TERM GOAL #1   Title Pt will be independent with PD-specific HEP.--check STGs 06/12/16   Time 4   Period  Weeks   Status On-going   OT SHORT TERM GOAL #2   Title Pt will report improved ability to cut food and decr spills when eating.   Time 4   Period Weeks   Status On-going   OT SHORT TERM GOAL #3   Title Pt will report incr ease with donning shoes and perform in reasonable amount of time.   Time 4   Period Weeks   Status On-going   OT SHORT TERM GOAL #4   Title Pt will report incr ease with fastening bra and buttons.   Time 4   Period Weeks   Status On-going   OT SHORT TERM GOAL #5   Title Pt will report incr ease turning in bed using large amplitude movement strategies.   Time 4   Period Weeks   Status Achieved  06/17/16   OT SHORT TERM GOAL #6   Title Pt will write at least 3 sentences with at least 90% legibility and only min decr in size.   Baseline legibility 50% cursive, 75% print     Time 4   Period Weeks   Status Achieved  06/15/16:  75% cursive, 90% print with only min decr in size (recommended printing)           OT Long Term Goals - 05/13/16 1646    OT LONG TERM GOAL #1   Title Pt will verbalize understanding of adaptive strategies/AE for ADLs/IADLs prn.--check LTGs 06/12/16   Time 8   Period Weeks   Status New   OT LONG TERM GOAL #2   Title Pt will improve coordination for ADLs as shown by reducing time on 9-hole peg test by at least 5 sec with RUE.   Baseline  32.50   Time 8   Period Weeks   Status New   OT LONG TERM GOAL #3   Title Pt will improve coordination/functional reaching as shown by improving box and blocks test by at least 5 bilaterally.   Baseline R-44, L-40 blocks   Time 8   Period Weeks   Status New   OT LONG TERM GOAL #4   Title Pt will improve functional reaching/balance for IADLs as shown by improving standing functional reach by at least 2" bilaterally.   Baseline 8" bilaterally   Time 8   Period Weeks   Status New   OT LONG TERM GOAL #5   Title Pt will write at least 3 sentences with at least 90% legibility and only min decr in  size.   Baseline legibility 50% cursive, 75% print, mod micrographia   Time  8   Period Weeks   Status New   OT LONG TERM GOAL #6   Title Pt will verbalize understanding of ways to prevent future complications and appropriate community resources prn.   Time 8   Period Weeks   Status New               Plan - 06/17/16 [id number]    Clinical Impression Statement Pt is progressing towards goals, but STGs ongoing due to decr frequency.   Rehab Potential Good   Clinical Impairments Affecting Rehab Potential hx of falls, possible PSP   OT Frequency 2x / week   OT Duration 8 weeks   Plan large amplitude movements for functional activities/ADLs with focus of timing and coordination   OT Home Exercise Plan Education provided:  coordination HEP with focus on timing and big movements, PWR! hands (basic 4), initiated prevention of future complications; PWR! supine   Consulted and Agree with Plan of Care Patient      Patient will benefit from skilled therapeutic intervention in order to improve the following deficits and impairments:  Abnormal gait, Decreased coordination, Decreased range of motion, Difficulty walking, Impaired flexibility, Increased edema, Decreased safety awareness, Decreased endurance, Decreased activity tolerance, Decreased knowledge of precautions, Impaired tone, Pain, Impaired UE functional use, Decreased scar mobility, Decreased knowledge of use of DME, Decreased balance, Decreased cognition, Decreased mobility, Decreased strength  Visit Diagnosis: Other symptoms and signs involving the nervous system  Other lack of coordination  Unsteadiness on feet  Visuospatial deficit    Problem List Patient Active Problem List   Diagnosis Date Noted  . Family history of colon cancer 07/30/2014  . Hiatal hernia 07/30/2014  . Chest pain 10/13/2011  . Disequilibrium 10/13/2011  . Heart valve replaced by other means 04/16/2011  . Anticoagulant long-term use 03/17/2011  . S/P  AVR (aortic valve replacement)   . PVC's (premature ventricular contractions)   . HTN (hypertension)   . Hypercholesteremia   . A-fib ([place])   . GERD (gastroesophageal reflux disease)   . GERD 02/06/2010  . DIVERTICULOSIS-COLON 02/06/2010  . PERSONAL HX COLONIC POLYPS 02/06/2010  . MASTECTOMY, HX OF 02/06/2010    [facility] 06/17/2016, 9:24 AM  [facility] [facility] [address] [place], [place], [address] Phone: [phone]   Fax:  [phone]  Name: JEILANI PALM MRN: [id number] Date of Birth: 06/08/1941  Vianne Bulls, OTR/L [facility] [address] [place], [place]  [address] [phone] phone [phone] 06/17/2016 9:24 AM

## 2016-06-22 ENCOUNTER — Encounter: Payer: Self-pay | Admitting: Neurology

## 2016-06-22 ENCOUNTER — Ambulatory Visit (INDEPENDENT_AMBULATORY_CARE_PROVIDER_SITE_OTHER): Payer: PPO | Admitting: Neurology

## 2016-06-22 ENCOUNTER — Ambulatory Visit: Payer: PPO | Admitting: Occupational Therapy

## 2016-06-22 VITALS — BP 122/66 | HR 66 | Resp 16 | Ht 59.5 in | Wt 144.0 lb

## 2016-06-22 DIAGNOSIS — R29818 Other symptoms and signs involving the nervous system: Secondary | ICD-10-CM

## 2016-06-22 DIAGNOSIS — M542 Cervicalgia: Secondary | ICD-10-CM | POA: Diagnosis not present

## 2016-06-22 DIAGNOSIS — R0683 Snoring: Secondary | ICD-10-CM

## 2016-06-22 DIAGNOSIS — E663 Overweight: Secondary | ICD-10-CM

## 2016-06-22 DIAGNOSIS — G8929 Other chronic pain: Secondary | ICD-10-CM | POA: Diagnosis not present

## 2016-06-22 DIAGNOSIS — R0902 Hypoxemia: Secondary | ICD-10-CM

## 2016-06-22 DIAGNOSIS — Z952 Presence of prosthetic heart valve: Secondary | ICD-10-CM

## 2016-06-22 DIAGNOSIS — G4734 Idiopathic sleep related nonobstructive alveolar hypoventilation: Secondary | ICD-10-CM

## 2016-06-22 DIAGNOSIS — Z954 Presence of other heart-valve replacement: Secondary | ICD-10-CM | POA: Diagnosis not present

## 2016-06-22 DIAGNOSIS — R351 Nocturia: Secondary | ICD-10-CM

## 2016-06-22 DIAGNOSIS — R2681 Unsteadiness on feet: Secondary | ICD-10-CM

## 2016-06-22 DIAGNOSIS — R2689 Other abnormalities of gait and mobility: Secondary | ICD-10-CM | POA: Diagnosis not present

## 2016-06-22 DIAGNOSIS — R519 Headache, unspecified: Secondary | ICD-10-CM

## 2016-06-22 DIAGNOSIS — G4752 REM sleep behavior disorder: Secondary | ICD-10-CM

## 2016-06-22 DIAGNOSIS — G20 Parkinson's disease: Secondary | ICD-10-CM

## 2016-06-22 DIAGNOSIS — R51 Headache: Secondary | ICD-10-CM

## 2016-06-22 DIAGNOSIS — R278 Other lack of coordination: Secondary | ICD-10-CM

## 2016-06-22 DIAGNOSIS — I480 Paroxysmal atrial fibrillation: Secondary | ICD-10-CM | POA: Diagnosis not present

## 2016-06-22 NOTE — Therapy (Signed)
[facility] [address] [place], [place], [address] Phone: [phone]   Fax:  [phone]  Occupational Therapy Treatment  Patient Details  Name: DELORIES ZIEMER MRN: [id number] Date of Birth: 11-06-1941 Referring Provider: Dr. Wells Guiles Tat  Encounter Date: 06/22/2016      OT End of Session - 06/22/16 0810    Visit Number 7   Number of Visits 17   Date for OT Re-Evaluation 07/12/16   Authorization Type [employer]; no visit limit or auth; G-code   Authorization - Visit Number 7   Authorization - Number of Visits 10   OT Start Time [phone]   OT Stop Time 0845   OT Time Calculation (min) 41 min   Activity Tolerance Patient tolerated treatment well   Behavior During Therapy [facility] for tasks assessed/performed      Past Medical History  Diagnosis Date  . Aortic stenosis, severe   . HTN (hypertension)   . Hypercholesteremia   . PVC's (premature ventricular contractions)   . A-fib (HCC)     paroxysmal  . Venous varices    . GERD (gastroesophageal reflux disease)   . Breast CA ([place]) 2004  . Chest pain   . Herpes zoster infection     History of herpes zoster infection  . Hypercholesterolemia   . Obesity   . History of kidney stones   . History of bronchitis   . Mucositis     Severe mucositis  . Irritable bowel syndrome   . History of hiatal hernia   . PONV (postoperative nausea and vomiting)   . Hemifacial spasm     Past Surgical History  Procedure Laterality Date  . Aortic valve replacement  04/13/06    St. Jude mechanical conduit  . Total abdominal hysterectomy    . Cystocele repair    . Rectocele repair    . Appendectomy    . Kidney stone surgery    . Foot surgery    . Cataract surgery      bilateral  . Esophageal manometry N/A 08/12/2014    Procedure: ESOPHAGEAL MANOMETRY (EM);  Surgeon: [name], MD;  Location: [facility] ENDOS[facility];  Service: Endoscopy;  Laterality: N/A;  . Esophagogastroduodenoscopy  N/A 10/03/2014    Procedure: ESOPHAGOGASTRODUODENOSCOPY (EGD);  Surgeon: [name], MD;  Location: [facility] ENDOSCOPY;  Service: Endoscopy;  Laterality: N/A;  . Colonoscopy N/A 10/03/2014    Procedure: COLONOSCOPY;  Surgeon: [name], MD;  Location: [place];  Service: Endoscopy;  Laterality: N/A;    There were no vitals filed for this visit.      Subjective Assessment - 06/22/16 0806    Subjective  "on shots since she had to go off coumadin prior to getting cortisone injections Friday"   Pertinent History Newly diagnosed Parkinsonism (? PSP); hx of multiple falls; hx of L breast CA (treated with chemo, radiation, mastectomy/reconstruction 2004); heart valve replacement; PVC's; HTN; macular degeneration   Limitations fall risk   Patient Stated Goals improve writing   Currently in Pain? Yes   Pain Score 4    Pain Location Neck   Pain Orientation Left   Pain Descriptors / Indicators Sore   Pain Type Chronic pain   Pain Onset More than a month ago   Pain Frequency Constant   Aggravating Factors  turning   Pain Relieving Factors rest              Neuro Re-ed:  In standing, functional step and reach to  the side with each UE/LE to flip large cards with focus on timing and coordination of UE/LE, mod v.c. Given for timing, particularly to the L side then min cues to the R side   Placing small pegs in pegboard with PWR! Step to focus on timing and individual finger coordination with each hand and min cues to fully open hand prior to picking up pegs and for coordination, min difficulty.     Sliding cards off table by using PWR! Hands with focus on finger ext and min cues for incr movement amplitude.    PWR! up in sitting x 10 with min cues For incr movement amplitude/timing.                 OT Short Term Goals - 06/17/16 0829    OT SHORT TERM GOAL #1   Title Pt will be independent with PD-specific HEP.--check STGs 06/12/16   Time 4   Period Weeks    Status On-going   OT SHORT TERM GOAL #2   Title Pt will report improved ability to cut food and decr spills when eating.   Time 4   Period Weeks   Status On-going   OT SHORT TERM GOAL #3   Title Pt will report incr ease with donning shoes and perform in reasonable amount of time.   Time 4   Period Weeks   Status On-going   OT SHORT TERM GOAL #4   Title Pt will report incr ease with fastening bra and buttons.   Time 4   Period Weeks   Status On-going   OT SHORT TERM GOAL #5   Title Pt will report incr ease turning in bed using large amplitude movement strategies.   Time 4   Period Weeks   Status Achieved  06/17/16   OT SHORT TERM GOAL #6   Title Pt will write at least 3 sentences with at least 90% legibility and only min decr in size.   Baseline legibility 50% cursive, 75% print     Time 4   Period Weeks   Status Achieved  06/15/16:  75% cursive, 90% print with only min decr in size (recommended printing)           OT Long Term Goals - 05/13/16 1646    OT LONG TERM GOAL #1   Title Pt will verbalize understanding of adaptive strategies/AE for ADLs/IADLs prn.--check LTGs 06/12/16   Time 8   Period Weeks   Status New   OT LONG TERM GOAL #2   Title Pt will improve coordination for ADLs as shown by reducing time on 9-hole peg test by at least 5 sec with RUE.   Baseline  32.50   Time 8   Period Weeks   Status New   OT LONG TERM GOAL #3   Title Pt will improve coordination/functional reaching as shown by improving box and blocks test by at least 5 bilaterally.   Baseline R-44, L-40 blocks   Time 8   Period Weeks   Status New   OT LONG TERM GOAL #4   Title Pt will improve functional reaching/balance for IADLs as shown by improving standing functional reach by at least 2" bilaterally.   Baseline 8" bilaterally   Time 8   Period Weeks   Status New   OT LONG TERM GOAL #5   Title Pt will write at least 3 sentences with at least 90% legibility and only min decr in size.    Baseline legibility  50% cursive, 75% print, mod micrographia   Time 8   Period Weeks   Status New   OT LONG TERM GOAL #6   Title Pt will verbalize understanding of ways to prevent future complications and appropriate community resources prn.   Time 8   Period Weeks   Status New               Plan - 06/22/16 0810    Clinical Impression Statement Pt progressing towards with improved timing/coordination of complex movements with cueing today (improved response to cues).   Rehab Potential Good   Clinical Impairments Affecting Rehab Potential hx of falls, possible PSP   OT Frequency 2x / week   OT Duration 8 weeks   Plan large amplitude movements for functional activities/ADLs with focus on timing and coordination   OT Home Exercise Plan Education provided:  coordination HEP with focus on timing and big movements, PWR! hands (basic 4), initiated prevention of future complications; PWR! supine   Consulted and Agree with Plan of Care Patient      Patient will benefit from skilled therapeutic intervention in order to improve the following deficits and impairments:  Abnormal gait, Decreased coordination, Decreased range of motion, Difficulty walking, Impaired flexibility, Increased edema, Decreased safety awareness, Decreased endurance, Decreased activity tolerance, Decreased knowledge of precautions, Impaired tone, Pain, Impaired UE functional use, Decreased scar mobility, Decreased knowledge of use of DME, Decreased balance, Decreased cognition, Decreased mobility, Decreased strength  Visit Diagnosis: Other symptoms and signs involving the nervous system  Other lack of coordination  Unsteadiness on feet    Problem List Patient Active Problem List   Diagnosis Date Noted  . Family history of colon cancer 07/30/2014  . Hiatal hernia 07/30/2014  . Chest pain 10/13/2011  . Disequilibrium 10/13/2011  . Heart valve replaced by other means 04/16/2011  . Anticoagulant long-term use  03/17/2011  . S/P AVR (aortic valve replacement)   . PVC's (premature ventricular contractions)   . HTN (hypertension)   . Hypercholesteremia   . A-fib ([place])   . GERD (gastroesophageal reflux disease)   . GERD 02/06/2010  . DIVERTICULOSIS-COLON 02/06/2010  . PERSONAL HX COLONIC POLYPS 02/06/2010  . MASTECTOMY, HX OF 02/06/2010    [facility] 06/22/2016, 9:19 AM  [place] [address] [place], [place], [address] Phone: [phone]   Fax:  [phone]  Name: LONA SNIFFEN MRN: [id number] Date of Birth: March 22, 1941  Vianne Bulls, OTR/L [facility] [address] [place], [place]  [address] [phone] phone [phone] 06/22/2016 9:19 AM

## 2016-06-22 NOTE — Patient Instructions (Signed)

## 2016-06-22 NOTE — Progress Notes (Signed)
Subjective:    Patient ID: [name] is a 75 y.o. female.  HPI     Star Age, MD, PhD [facility] [address] [place], [place] [address]  Dear Dr. Philip Aspen,   I saw your patient, [name], upon your kind request in my neurologic clinic today for initial consultation of her sleep disorder, in particular, concern for underlying obstructive sleep apnea. The patient is accompanied by her husband today. As you know, [name] is a very friendly 75 year old right-handed woman with an underlying complex medical history of aortic stenosis, status post mechanical aortic valve replacement on in 2007, hypertension, hyperlipidemia, history of A. fib, reflux disease, breast cancer, hyperlipidemia, obesity, kidney stones, irritable bowel syndrome, history of hemifacial spasm, history of parkinsonism (for which she has recently seen Drs. Jaffe and Tat), status post total abdominal hysterectomy, appendectomy, kidney stone surgery, foot surgery, and bilateral cataract repairs, who reports snoring and excessive daytime somnolence.  I reviewed your office note from 05/25/2016, which you kindly included. She also had an overnight pulse oximetry test through your office on 05/27/2016 which I reviewed: Total test time was 4 hours and 53 minutes, average oxygen saturation 94.7%, nadir was 64% which may have been an error. Nadir appears to be 79%. Time below 90% saturation was 9.4 minutes for the night. She is currently on Lovenox inj. In preparation for her neck injection this week. She has had neck pain for several months. She had to come off of Coumadin for 5 days before her injections. She has been walking with a cane. She has had recurrent falls which improved after she started using a cane and started outpatient therapy. She has been on Sinemet 1 pill 3 times a day with  good tolerance reported. She has not noticed much in the way of difference after starting  the Sinemet however. She is not aware of any family history of obstructive sleep apnea. Her Epworth sleepiness score is 4 out of 24 today, her fatigue score is 6 out of 63. She goes to bed usually after 11:30, sometimes they watch TV in bed. She has trouble going to sleep and staying asleep. She wakes up around 6 or 7 AM. She has occasional morning headaches and nocturia about twice per average night. She does have a history of dream enactments, very infrequent and has called out in her sleep and moved her limbs in her sleep. This happens very infrequently per husband. She denies any overt restless leg symptoms or leg twitching at night. She does dream, often vividly but no nightmares or reported. She drinks caffeine in the form of soda, half a can or bottle to one bottle per day, not necessarily every day. She does not drink coffee. She is retired from Scientist, research (medical) and also Financial controller for 26 years. She has 3 grown children. She had a recent swallow study which per her verbal report was unremarkable.    Her Past Medical History Is Significant For: Past Medical History  Diagnosis Date  . Aortic stenosis, severe   . HTN (hypertension)   . Hypercholesteremia   . PVC's (premature ventricular contractions)   . A-fib (HCC)     paroxysmal  . Venous varices    . GERD (gastroesophageal reflux disease)   . Breast CA ([place]) 2004  . Chest pain   . Herpes zoster infection     History of herpes zoster infection  . Hypercholesterolemia   . Obesity   .  History of kidney stones   . History of bronchitis   . Mucositis     Severe mucositis  . Irritable bowel syndrome   . History of hiatal hernia   . PONV (postoperative nausea and vomiting)   . Hemifacial spasm     Her Past Surgical History Is Significant For: Past Surgical History  Procedure Laterality Date  . Aortic valve replacement  04/13/06    St. Jude mechanical conduit  . Total abdominal hysterectomy    . Cystocele repair    .  Rectocele repair    . Appendectomy    . Kidney stone surgery    . Foot surgery    . Cataract surgery      bilateral  . Esophageal manometry N/A 08/12/2014    Procedure: ESOPHAGEAL MANOMETRY (EM);  Surgeon: [name], MD;  Location: [facility] ENDOSCOPY;  Service: Endoscopy;  Laterality: N/A;  . Esophagogastroduodenoscopy N/A 10/03/2014    Procedure: ESOPHAGOGASTRODUODENOSCOPY (EGD);  Surgeon: [name], MD;  Location: [facility] ENDOSCOPY;  Service: Endoscopy;  Laterality: N/A;  . Colonoscopy N/A 10/03/2014    Procedure: COLONOSCOPY;  Surgeon: [name], MD;  Location: [place];  Service: Endoscopy;  Laterality: N/A;    Her Family History Is Significant For: Family History  Problem Relation Age of Onset  . Heart disease Mother   . Heart disease Father 61    CABG  . Other Brother     AVR    Her Social History Is Significant For: Social History   Social History  . Marital Status: Married    Spouse Name: N/A  . Number of Children: 3  . Years of Education: 10   Occupational History  . bakery/caterer Other    retired   Social History Main Topics  . Smoking status: Never Smoker   . Smokeless tobacco: Never Used  . Alcohol Use: No  . Drug Use: No  . Sexual Activity: Not Asked   Other Topics Concern  . None   Social History Narrative      1/2 -1 soda a day      Her Allergies Are:  Allergies  Allergen Reactions  . Demerol Shortness Of Breath and Swelling  . Meperidine Hcl Shortness Of Breath and Swelling  . Ivp Dye [Iodinated Diagnostic Agents] Hives    OK with benadryl  . Oxycodone     Hallucinations   :   Her Current Medications Are:  Outpatient Encounter Prescriptions as of 06/22/2016  Medication Sig  . Ascorbic Acid (VITAMIN C) 1000 MG tablet Take 1,000 mg by mouth 3 (three) times daily.    . calcium citrate-vitamin D (CITRACAL+D) 315-200 MG-UNIT per tablet Take 1 tablet by mouth 3 (three) times daily.  . carbidopa-levodopa (SINEMET IR) 25-100 MG tablet  Take 1 tablet by mouth 3 (three) times daily.  . Cholecalciferol (VITAMIN D3) 1000 UNITS tablet Take 2,000 Units by mouth daily.   . famotidine-calcium carbonate-magnesium hydroxide (PEPCID COMPLETE) 10-800-165 MG CHEW chewable tablet Chew 1 tablet by mouth daily as needed (heartburn).  . metoprolol succinate (TOPROL XL) 25 MG 24 hr tablet Take 1 tablet (25 mg total) by mouth daily.  . Multiple Vitamin (MULTIVITAMIN) tablet Take 1 tablet by mouth daily at 12 noon.   . Multiple Vitamins-Minerals (VISION-VITE PRESERVE PO) Take 1 tablet by mouth 2 (two) times daily.  Marland Kitchen oxybutynin (DITROPAN) 5 MG tablet Take 5 mg by mouth daily.  . pantoprazole (PROTONIX) 40 MG tablet Take 40 mg by mouth daily.   Marland Kitchen  simvastatin (ZOCOR) 20 MG tablet Take 20 mg by mouth every evening.  . triamterene-hydrochlorothiazide (MAXZIDE-25) 37.5-25 MG per tablet Take 1 tablet by mouth daily.  Marland Kitchen UNABLE TO FIND Take 900 mg by mouth daily. Omega Red  . vitamin B-12 (CYANOCOBALAMIN) 1000 MCG tablet Take 1,000 mcg by mouth every morning.  . warfarin (COUMADIN) 5 MG tablet Take 5 mg by mouth every evening. Patient also increases dosage to 7.5mg  1-2 days/week   No facility-administered encounter medications on file as of 06/22/2016.  :  Review of Systems:  Out of a complete 14 point review of systems, all are reviewed and negative with the exception of these symptoms as listed below:   Review of Systems  Neurological:       Patient has trouble falling asleep, snoring, morning headaches, denies taking naps   States that she is currently seeing Dr. Carles Collet for possible parkinsonism.   Epworth Sleepiness Scale 0= would never doze 1= slight chance of dozing 2= moderate chance of dozing 3= high chance of dozing  Sitting and reading:1 Watching TV:2 Sitting inactive in a public place (ex. Theater or meeting):0 As a passenger in a car for an hour without a break:1 Lying down to rest in the afternoon:0 Sitting and talking to  someone:0 Sitting quietly after lunch (no alcohol):0 In a car, while stopped in traffic:0 Total:4   Objective:  Neurologic Exam  Physical Exam Physical Examination:   Filed Vitals:   06/22/16 1004  BP: 122/66  Pulse: 66  Resp: 16   General Examination: The patient is a very pleasant 75 y.o. female in no acute distress. She appears well-developed and well-nourished and well groomed.   HEENT: Normocephalic, atraumatic, pupils are equal, round and reactive to light and accommodation. Funduscopic exam is normal with sharp disc margins noted, s/p b/l cataract repairs. Extraocular tracking Shows moderate saccadic breakdown. She has decrease in eye blink rate. She has mild facial masking. Mild intermittent spasms around the left eye, in keeping with blepharospasm. Hearing is grossly intact. She has a mildly hypophonic speech, perhaps a touch of dysarthria noted, no sialorrhea noted. She has moderate neck rigidity with decrease in passive range of motion. Oropharynx exam reveals: mild mouth dryness, adequate dental hygiene and moderate airway crowding, due to redundant soft palate and mildly larger uvula. Mallampati is class II. Tongue protrudes centrally and palate elevates symmetrically. Tonsils are small. Neck size is 15 1/8 inches. She has a Mild overbite.   Chest: Clear to auscultation without wheezing, rhonchi or crackles noted.  Heart: S1+S2+0, regular and with a valve click. She has a systolic murmur.    Abdomen: Soft, non-tender and non-distended with normal bowel sounds appreciated on auscultation.  Extremities: There is no pitting edema in the distal lower extremities bilaterally. Pedal pulses are intact.  Skin: Warm and dry without trophic changes noted. There are no varicose veins.  Musculoskeletal: exam reveals no obvious joint deformities, tenderness or joint swelling or erythema, larger L ankle from prior injury some 20 years ago.   Neurologically:  Mental status: The  patient is awake, alert and oriented in all 4 spheres. Her immediate and remote memory, attention, language skills and fund of knowledge are appropriate. There is no evidence of aphasia, agnosia, apraxia or anomia. Speech is clear with normal prosody and enunciation. Thought process is linear. Mood is normal and affect is normal.  Cranial nerves II - XII are as described above under HEENT exam. In addition: shoulder shrug is normal with equal shoulder  height noted. Motor exam: Normal bulk, strength and tone is noted. There is no drift, tremor or rebound. Romberg is negative. Reflexes are 1+ throughout. Fine motor skills and coordination: Mild to moderate difficulty in both upper and lower extremities, no significant lateralization noted.  Cerebellar testing: No dysmetria or intention tremor on finger to nose testing. Heel to shin is unremarkable bilaterally. There is no truncal or gait ataxia.  Sensory exam: intact to light touch in the upper and lower extremities.  Gait, station and balance: She stands with difficulty. No veering to one side is noted. No leaning to one side is noted. Posture is  mildly stooped. She walks slightly insecurely without her cane. She brought a single-point cane. She has mild decrease in arm swing bilaterally.                Assessment and Plan:    In summary, [name] is a very pleasant 75 y.o.-year old female with an underlying complex medical history of aortic stenosis, status post mechanical aortic valve replacement on in 2007, hypertension, hyperlipidemia, history of A. fib, reflux disease, breast cancer, hyperlipidemia, obesity, kidney stones, irritable bowel syndrome, history of hemifacial spasm, history of parkinsonism (for which she has recently seen Drs. Jaffe and Tat), status post total abdominal hysterectomy, appendectomy, kidney stone surgery, foot surgery, and bilateral cataract repairs, who has difficulty with her sleep including snoring, sleep disruption,  nocturia, pain in her neck with discomfort disturbing her sleep, rare REM behavior disorder. There is some concern for underlying obstructive sleep apneas well.  I had a long chat with the patient and her husband about my findings and the diagnosis of OSA, its prognosis and treatment options. We talked about medical treatments, surgical interventions and non-pharmacological approaches. I explained in particular the risks and ramifications of untreated moderate to severe OSA, especially with respect to developing cardiovascular disease down the Road, including congestive heart failure, difficult to treat hypertension, cardiac arrhythmias, or stroke. Even type 2 diabetes has, in part, been linked to untreated OSA. Symptoms of untreated OSA include daytime sleepiness, memory problems, mood irritability and mood disorder such as depression and anxiety, lack of energy, as well as recurrent headaches, especially morning headaches. We talked about trying to maintain a healthy lifestyle in general, as well as the importance of weight control. I encouraged the patient to eat healthy, exercise daily and keep well hydrated, to keep a scheduled bedtime and wake time routine, to not skip any meals and eat healthy snacks in between meals. I advised the patient not to drive when feeling sleepy. I recommended the following at this time: sleep study with potential positive airway pressure titration. (We will score hypopneas at 4% and split the sleep study into diagnostic and treatment portion, if the estimated. 2 hour AHI is >15/h).   I explained the sleep test procedure to the patient and also outlined possible surgical and non-surgical treatment options of OSA, including the use of a custom-made dental device (which would require a referral to a specialist dentist or oral surgeon), upper airway surgical options, such as pillar implants, radiofrequency surgery, tongue base surgery, and UPPP (which would involve a referral to an  ENT surgeon). Rarely, jaw surgery such as mandibular advancement may be considered.  I also explained the CPAP treatment option to the patient, who indicated that she would be willing to try CPAP if the need arises. I explained the importance of being compliant with PAP treatment, not only for insurance purposes but  primarily to improve Her symptoms, and for the patient's long term health benefit, including to reduce Her cardiovascular risks. I answered all their questions today and the patient and her husband were in agreement. I would like to see her back after the sleep study is completed and encouraged her to call with any interim questions, concerns, problems or updates.   Thank you very much for allowing me to participate in the care of this nice patient. If I can be of any further assistance to you please do not hesitate to call me at [phone].  Sincerely,   Star Age, MD, PhD

## 2016-06-24 ENCOUNTER — Ambulatory Visit: Payer: PPO | Admitting: Occupational Therapy

## 2016-06-24 DIAGNOSIS — R2689 Other abnormalities of gait and mobility: Secondary | ICD-10-CM | POA: Diagnosis not present

## 2016-06-24 DIAGNOSIS — Z7901 Long term (current) use of anticoagulants: Secondary | ICD-10-CM | POA: Diagnosis not present

## 2016-06-24 DIAGNOSIS — I358 Other nonrheumatic aortic valve disorders: Secondary | ICD-10-CM | POA: Diagnosis not present

## 2016-06-24 DIAGNOSIS — R278 Other lack of coordination: Secondary | ICD-10-CM

## 2016-06-24 DIAGNOSIS — R2681 Unsteadiness on feet: Secondary | ICD-10-CM

## 2016-06-24 DIAGNOSIS — R41842 Visuospatial deficit: Secondary | ICD-10-CM

## 2016-06-24 DIAGNOSIS — R29818 Other symptoms and signs involving the nervous system: Secondary | ICD-10-CM

## 2016-06-24 NOTE — Therapy (Addendum)
[facility] [address] [place], [place], [address] Phone: [phone]   Fax:  [phone]  Occupational Therapy Treatment  Patient Details  Name: [name] MRN: [id number] Date of Birth: June 19, 1941 Referring Provider: Dr. Wells Guiles Tat  Encounter Date: 06/24/2016      OT End of Session - 06/24/16 0825    Visit Number 8   Number of Visits 17   Date for OT Re-Evaluation 07/12/16   Authorization Type [employer]; no visit limit or auth; G-code   Authorization - Visit Number 8   Authorization - Number of Visits 10   OT Start Time 0805   OT Stop Time 0848   OT Time Calculation (min) 43 min   Activity Tolerance Patient tolerated treatment well   Behavior During Therapy [facility] for tasks assessed/performed      Past Medical History  Diagnosis Date  . Aortic stenosis, severe   . HTN (hypertension)   . Hypercholesteremia   . PVC's (premature ventricular contractions)   . A-fib (HCC)     paroxysmal  . Venous varices    . GERD (gastroesophageal reflux disease)   . Breast CA ([place]) 2004  . Chest pain   . Herpes zoster infection     History of herpes zoster infection  . Hypercholesterolemia   . Obesity   . History of kidney stones   . History of bronchitis   . Mucositis     Severe mucositis  . Irritable bowel syndrome   . History of hiatal hernia   . PONV (postoperative nausea and vomiting)   . Hemifacial spasm     Past Surgical History  Procedure Laterality Date  . Aortic valve replacement  04/13/06    St. Jude mechanical conduit  . Total abdominal hysterectomy    . Cystocele repair    . Rectocele repair    . Appendectomy    . Kidney stone surgery    . Foot surgery    . Cataract surgery      bilateral  . Esophageal manometry N/A 08/12/2014    Procedure: ESOPHAGEAL MANOMETRY (EM);  Surgeon: [name], MD;  Location: [facility];  Service: Endoscopy;  Laterality: N/A;  . Esophagogastroduodenoscopy  N/A 10/03/2014    Procedure: ESOPHAGOGASTRODUODENOSCOPY (EGD);  Surgeon: [name], MD;  Location: [facility] ENDOSCOPY;  Service: Endoscopy;  Laterality: N/A;  . Colonoscopy N/A 10/03/2014    Procedure: COLONOSCOPY;  Surgeon: [name], MD;  Location: [facility];  Service: Endoscopy;  Laterality: N/A;    There were no vitals filed for this visit.      Subjective Assessment - 06/24/16 0806    Subjective  Pt reports performing HEP regularly   Pertinent History Newly diagnosed Parkinsonism (? PSP); hx of multiple falls; hx of L breast CA (treated with chemo, radiation, mastectomy/reconstruction 2004); heart valve replacement; PVC's; HTN; macular degeneration   Limitations fall risk   Patient Stated Goals improve writing   Currently in Pain? Yes   Pain Score 4    Pain Location Neck   Pain Orientation Left   Pain Descriptors / Indicators Sore   Pain Type Chronic pain   Pain Onset More than a month ago   Pain Frequency Constant   Aggravating Factors  turning    Pain Relieving Factors rest      Neuro re-ed:  PWR! Moves (basic 4) in sitting x 10 each with min cues For incr movement amplitude/timing.  Arm bike x 1min level 1 for  reciprocal movement with cues/target of at least 40rpms for intensity while maintaining movement amplitude/reciprocal movement.   Pt maintained 34-40rpms.  In standing, functional step and reach diagonally (back/forward in prep for turns) with each UE/LE to flip large cards with focus on timing and coordination of UE/LE, mod v.c initially Given for timing then min cues.   Self-care:  Sit>stand with min-mod cues for large amplitude movement technique.  Side-stepping in prep to sit in chair at table and then sitting at table and scooting chair with min cueing.  Education provided on use of diagonal step with same LE as direction change for turn.  Pt verbalized understanding.   Coordination:    Placing grooved pegs in pegboard with each hand with min  cueing for use of PWR! Hands prior to picking up peg for timing and larger amplitude movements.  Rotating 2 balls in hand for in-hand manipulation/decr rigidity with min cueing for R hand and min difficulty each hand (only 1 direction each hand).  Rotating small ball in fingertips with each hand with min-mod cues for incr movement amplitude (min-mod difficulty with more difficulty/cues for R vs. L hand)                     OT Short Term Goals - 06/17/16 0829    OT SHORT TERM GOAL #1   Title Pt will be independent with PD-specific HEP.--check STGs 06/12/16   Time 4   Period Weeks   Status On-going   OT SHORT TERM GOAL #2   Title Pt will report improved ability to cut food and decr spills when eating.   Time 4   Period Weeks   Status On-going   OT SHORT TERM GOAL #3   Title Pt will report incr ease with donning shoes and perform in reasonable amount of time.   Time 4   Period Weeks   Status On-going   OT SHORT TERM GOAL #4   Title Pt will report incr ease with fastening bra and buttons.   Time 4   Period Weeks   Status On-going   OT SHORT TERM GOAL #5   Title Pt will report incr ease turning in bed using large amplitude movement strategies.   Time 4   Period Weeks   Status Achieved  06/17/16   OT SHORT TERM GOAL #6   Title Pt will write at least 3 sentences with at least 90% legibility and only min decr in size.   Baseline legibility 50% cursive, 75% print     Time 4   Period Weeks   Status Achieved  06/15/16:  75% cursive, 90% print with only min decr in size (recommended printing)           OT Long Term Goals - 06/24/16 1019    OT LONG TERM GOAL #1   Title Pt will verbalize understanding of adaptive strategies/AE for ADLs/IADLs prn.--check LTGs 07/12/16   Time 8   Period Weeks   Status New   OT LONG TERM GOAL #2   Title Pt will improve coordination for ADLs as shown by reducing time on 9-hole peg test by at least 5 sec with RUE.   Baseline  32.50    Time 8   Period Weeks   Status New   OT LONG TERM GOAL #3   Title Pt will improve coordination/functional reaching as shown by improving box and blocks test by at least 5 bilaterally.   Baseline R-44, L-40 blocks  Time 8   Period Weeks   Status New   OT LONG TERM GOAL #4   Title Pt will improve functional reaching/balance for IADLs as shown by improving standing functional reach by at least 2" bilaterally.   Baseline 8" bilaterally   Time 8   Period Weeks   Status New   OT LONG TERM GOAL #5   Title Pt will write at least 3 sentences with at least 90% legibility and only min decr in size.   Baseline legibility 50% cursive, 75% print, mod micrographia   Time 8   Period Weeks   Status New   OT LONG TERM GOAL #6   Title Pt will verbalize understanding of ways to prevent future complications and appropriate community resources prn.   Time 8   Period Weeks   Status New               Plan - 06/24/16 [id number]    Clinical Impression Statement Pt progressing towards goals with improved timing/coordination of movement with decr cues and demo improved response to cueing.   Rehab Potential Good   Clinical Impairments Affecting Rehab Potential hx of falls, possible PSP   OT Frequency 2x / week   OT Duration 8 weeks   Plan large amplitude movements with focus on timing and coordination   OT Home Exercise Plan Education provided:  coordination HEP with focus on timing and big movements, PWR! hands (basic 4), initiated prevention of future complications; PWR! supine   Consulted and Agree with Plan of Care Patient      Patient will benefit from skilled therapeutic intervention in order to improve the following deficits and impairments:  Abnormal gait, Decreased coordination, Decreased range of motion, Difficulty walking, Impaired flexibility, Increased edema, Decreased safety awareness, Decreased endurance, Decreased activity tolerance, Decreased knowledge of precautions, Impaired tone,  Pain, Impaired UE functional use, Decreased scar mobility, Decreased knowledge of use of DME, Decreased balance, Decreased cognition, Decreased mobility, Decreased strength  Visit Diagnosis: Other symptoms and signs involving the nervous system  Other lack of coordination  Unsteadiness on feet  Visuospatial deficit  Other abnormalities of gait and mobility    Problem List Patient Active Problem List   Diagnosis Date Noted  . Family history of colon cancer 07/30/2014  . Hiatal hernia 07/30/2014  . Chest pain 10/13/2011  . Disequilibrium 10/13/2011  . Heart valve replaced by other means 04/16/2011  . Anticoagulant long-term use 03/17/2011  . S/P AVR (aortic valve replacement)   . PVC's (premature ventricular contractions)   . HTN (hypertension)   . Hypercholesteremia   . A-fib ([place])   . GERD (gastroesophageal reflux disease)   . GERD 02/06/2010  . DIVERTICULOSIS-COLON 02/06/2010  . PERSONAL HX COLONIC POLYPS 02/06/2010  . MASTECTOMY, HX OF 02/06/2010    [facility] 06/24/2016, 10:19 AM  [facility] [address] [place], [place], [address] Phone: [phone]   Fax:  [phone]  Name: DYANNI BATTISTINI MRN: [id number] Date of Birth: [birth year]  Vianne Bulls, OTR/L [facility] [address] [place], [place]  [address] [phone] phone [phone] 06/24/2016 10:19 AM

## 2016-06-25 ENCOUNTER — Ambulatory Visit
Admission: RE | Admit: 2016-06-25 | Discharge: 2016-06-25 | Disposition: A | Discharge status: Home / Self Care | Payer: PPO | Source: Ambulatory Visit | Attending: Internal Medicine | Admitting: Internal Medicine

## 2016-06-25 ENCOUNTER — Other Ambulatory Visit: Payer: Self-pay | Admitting: Internal Medicine

## 2016-06-25 DIAGNOSIS — M542 Cervicalgia: Secondary | ICD-10-CM | POA: Diagnosis not present

## 2016-06-25 MED ORDER — TRIAMCINOLONE ACETONIDE 40 MG/ML IJ SUSP (RADIOLOGY)
60.0000 mg | Freq: Once | INTRAMUSCULAR | Status: AC
  Administered 2016-06-25: 60 mg via EPIDURAL

## 2016-06-25 MED ORDER — IOPAMIDOL (ISOVUE-M 300) INJECTION 61%
1.0000 mL | Freq: Once | INTRAMUSCULAR | Status: AC | PRN
Start: 2016-06-25 — End: 2016-06-25
  Administered 2016-06-25: 1 mL via EPIDURAL

## 2016-06-25 NOTE — Discharge Instructions (Signed)

## 2016-06-25 NOTE — Progress Notes (Signed)
Patient states she took Benadryl 50mg  PO "about an hour ago."  Brita Romp, RN

## 2016-06-28 ENCOUNTER — Ambulatory Visit: Payer: PPO | Attending: Neurology | Admitting: Occupational Therapy

## 2016-06-28 DIAGNOSIS — R41842 Visuospatial deficit: Secondary | ICD-10-CM | POA: Insufficient documentation

## 2016-06-28 DIAGNOSIS — R29818 Other symptoms and signs involving the nervous system: Secondary | ICD-10-CM

## 2016-06-28 DIAGNOSIS — R278 Other lack of coordination: Secondary | ICD-10-CM | POA: Diagnosis not present

## 2016-06-28 DIAGNOSIS — R2689 Other abnormalities of gait and mobility: Secondary | ICD-10-CM | POA: Diagnosis not present

## 2016-06-28 DIAGNOSIS — R2681 Unsteadiness on feet: Secondary | ICD-10-CM | POA: Insufficient documentation

## 2016-06-28 NOTE — Therapy (Signed)
[facility] [address] [place], [place], [address] Phone: [phone]   Fax:  [phone]  Occupational Therapy Treatment  Patient Details  Name: MCKELL SCIASCIA MRN: [id number] Date of Birth: 04/16/1941 Referring Provider: Dr. Wells Guiles Tat  Encounter Date: 06/28/2016      OT End of Session - 06/28/16 0815    Visit Number 9   Number of Visits 17   Date for OT Re-Evaluation 07/12/16   Authorization Type [employer]; no visit limit or auth; G-code   Authorization Time Period G code next visit   Authorization - Visit Number 9   Authorization - Number of Visits 10   OT Start Time 0804   OT Stop Time 0845   OT Time Calculation (min) 41 min   Activity Tolerance Patient tolerated treatment well   Behavior During Therapy [facility] for tasks assessed/performed      Past Medical History  Diagnosis Date  . Aortic stenosis, severe   . HTN (hypertension)   . Hypercholesteremia   . PVC's (premature ventricular contractions)   . A-fib (HCC)     paroxysmal  . Venous varices    . GERD (gastroesophageal reflux disease)   . Breast CA ([place]) 2004  . Chest pain   . Herpes zoster infection     History of herpes zoster infection  . Hypercholesterolemia   . Obesity   . History of kidney stones   . History of bronchitis   . Mucositis     Severe mucositis  . Irritable bowel syndrome   . History of hiatal hernia   . PONV (postoperative nausea and vomiting)   . Hemifacial spasm     Past Surgical History  Procedure Laterality Date  . Aortic valve replacement  04/13/06    St. Jude mechanical conduit  . Total abdominal hysterectomy    . Cystocele repair    . Rectocele repair    . Appendectomy    . Kidney stone surgery    . Foot surgery    . Cataract surgery      bilateral  . Esophageal manometry N/A 08/12/2014    Procedure: ESOPHAGEAL MANOMETRY (EM);  Surgeon: [name], MD;  Location: [facility];  Service: Endoscopy;   Laterality: N/A;  . Esophagogastroduodenoscopy N/A 10/03/2014    Procedure: ESOPHAGOGASTRODUODENOSCOPY (EGD);  Surgeon: [name], MD;  [facility] END[facility];  Service: Endoscopy;  Laterality: N/A;  . Colonoscopy N/A 10/03/2014    Procedure: COLONOSCOPY;  Surgeon: [name], MD;  Location: [facility];  Service: Endoscopy;  Laterality: N/A;    There were no vitals filed for this visit.      Subjective Assessment - 06/28/16 0807    Subjective  Pt reprots injection on Friday, and she is not to perform physical exercises for 3-4 days   Pertinent History Newly diagnosed Parkinsonism (? PSP); hx of multiple falls; hx of L breast CA (treated with chemo, radiation, mastectomy/reconstruction 2004); heart valve replacement; PVC's; HTN; macular degeneration   Limitations fall risk   Patient Stated Goals improve writing   Currently in Pain? Yes   Pain Score 5    Pain Orientation Posterior   Pain Descriptors / Indicators Aching   Pain Type Acute pain   Pain Onset In the past 7 days   Pain Frequency Constant   Aggravating Factors  movement   Pain Relieving Factors inactivity   Multiple Pain Sites No         Handwriting activities with emphasis  on larger letter size, and slowing down to form each letter. Tracing shapes, capital letters followed by printing and then writing in cursive, mod v.c.for technique using foam grip. Fine motor coordination activities placing grooved pegs in pegboard with right then left UE's , removing using PWR! Step, min v.c                       OT Short Term Goals - 06/17/16 0829    OT SHORT TERM GOAL #1   Title Pt will be independent with PD-specific HEP.--check STGs 06/12/16   Time 4   Period Weeks   Status On-going   OT SHORT TERM GOAL #2   Title Pt will report improved ability to cut food and decr spills when eating.   Time 4   Period Weeks   Status On-going   OT SHORT TERM GOAL #3   Title Pt will report incr ease with  donning shoes and perform in reasonable amount of time.   Time 4   Period Weeks   Status On-going   OT SHORT TERM GOAL #4   Title Pt will report incr ease with fastening bra and buttons.   Time 4   Period Weeks   Status On-going   OT SHORT TERM GOAL #5   Title Pt will report incr ease turning in bed using large amplitude movement strategies.   Time 4   Period Weeks   Status Achieved  06/17/16   OT SHORT TERM GOAL #6   Title Pt will write at least 3 sentences with at least 90% legibility and only min decr in size.   Baseline legibility 50% cursive, 75% print     Time 4   Period Weeks   Status Achieved  06/15/16:  75% cursive, 90% print with only min decr in size (recommended printing)           OT Long Term Goals - 06/24/16 1019    OT LONG TERM GOAL #1   Title Pt will verbalize understanding of adaptive strategies/AE for ADLs/IADLs prn.--check LTGs 07/12/16   Time 8   Period Weeks   Status New   OT LONG TERM GOAL #2   Title Pt will improve coordination for ADLs as shown by reducing time on 9-hole peg test by at least 5 sec with RUE.   Baseline  32.50   Time 8   Period Weeks   Status New   OT LONG TERM GOAL #3   Title Pt will improve coordination/functional reaching as shown by improving box and blocks test by at least 5 bilaterally.   Baseline R-44, L-40 blocks   Time 8   Period Weeks   Status New   OT LONG TERM GOAL #4   Title Pt will improve functional reaching/balance for IADLs as shown by improving standing functional reach by at least 2" bilaterally.   Baseline 8" bilaterally   Time 8   Period Weeks   Status New   OT LONG TERM GOAL #5   Title Pt will write at least 3 sentences with at least 90% legibility and only min decr in size.   Baseline legibility 50% cursive, 75% print, mod micrographia   Time 8   Period Weeks   Status New   OT LONG TERM GOAL #6   Title Pt will verbalize understanding of ways to prevent future complications and appropriate community  resources prn.   Time 8   Period Weeks   Status New  Plan - 06/28/16 [id number]    Clinical Impression Statement Pt s/p  steroid injection in neck on Friday was instructed to limit physical activity for 3-4 days. Therapy limited to handwriting and coordination activities  as a result.   Rehab Potential Good   Clinical Impairments Affecting Rehab Potential hx of falls, possible PSP   OT Frequency 2x / week   OT Duration 8 weeks   OT Treatment/Interventions Self-care/ADL training;Therapeutic exercise;Functional Mobility Training;Patient/family education;Balance training;Manual Therapy;Neuromuscular education;Ultrasound;Energy conservation;Therapeutic exercises;Cryotherapy;Parrafin;DME and/or AE instruction;Therapeutic activities;Cognitive remediation/compensation;Fluidtherapy;Electrical Stimulation;Moist Heat;Passive range of motion;Visual/perceptual remediation/compensation   Plan large amplitude movements with focus on timing/ coordination   OT Home Exercise Plan Education provided:  coordination HEP with focus on timing and big movements, PWR! hands (basic 4), initiated prevention of future complications; PWR! supine   Consulted and Agree with Plan of Care Patient      Patient will benefit from skilled therapeutic intervention in order to improve the following deficits and impairments:  Abnormal gait, Decreased coordination, Decreased range of motion, Difficulty walking, Impaired flexibility, Increased edema, Decreased safety awareness, Decreased endurance, Decreased activity tolerance, Decreased knowledge of precautions, Impaired tone, Pain, Impaired UE functional use, Decreased scar mobility, Decreased knowledge of use of DME, Decreased balance, Decreased cognition, Decreased mobility, Decreased strength  Visit Diagnosis: Other symptoms and signs involving the nervous system  Other lack of coordination    Problem List Patient Active Problem List   Diagnosis Date Noted   . Family history of colon cancer 07/30/2014  . Hiatal hernia 07/30/2014  . Chest pain 10/13/2011  . Disequilibrium 10/13/2011  . Heart valve replaced by other means 04/16/2011  . Anticoagulant long-term use 03/17/2011  . S/P AVR (aortic valve replacement)   . PVC's (premature ventricular contractions)   . HTN (hypertension)   . Hypercholesteremia   . A-fib ([place])   . GERD (gastroesophageal reflux disease)   . GERD 02/06/2010  . DIVERTICULOSIS-COLON 02/06/2010  . PERSONAL HX COLONIC POLYPS 02/06/2010  . MASTECTOMY, HX OF 02/06/2010    Maxi Rodas 06/28/2016, 8:16 AM Theone Murdoch, OTR/L [phone] Phone: [phone] 8:34 AM 06/28/2016 [facility] [address] [place], [place], [address] Phone: [phone]   Fax:  [phone]  Name: KAMREE FLANAGIN MRN: [id number] Date of Birth: 12-Sep-1941

## 2016-07-01 DIAGNOSIS — I358 Other nonrheumatic aortic valve disorders: Secondary | ICD-10-CM | POA: Diagnosis not present

## 2016-07-01 DIAGNOSIS — Z7901 Long term (current) use of anticoagulants: Secondary | ICD-10-CM | POA: Diagnosis not present

## 2016-07-05 DIAGNOSIS — I358 Other nonrheumatic aortic valve disorders: Secondary | ICD-10-CM | POA: Diagnosis not present

## 2016-07-05 DIAGNOSIS — Z7901 Long term (current) use of anticoagulants: Secondary | ICD-10-CM | POA: Diagnosis not present

## 2016-07-06 ENCOUNTER — Ambulatory Visit: Payer: PPO | Admitting: Occupational Therapy

## 2016-07-06 ENCOUNTER — Encounter: Payer: Self-pay | Admitting: Neurology

## 2016-07-06 ENCOUNTER — Ambulatory Visit (INDEPENDENT_AMBULATORY_CARE_PROVIDER_SITE_OTHER): Payer: PPO | Admitting: Neurology

## 2016-07-06 VITALS — BP 130/88 | HR 55 | Ht 59.75 in | Wt 141.0 lb

## 2016-07-06 DIAGNOSIS — G231 Progressive supranuclear ophthalmoplegia [Steele-Richardson-Olszewski]: Secondary | ICD-10-CM

## 2016-07-06 DIAGNOSIS — R278 Other lack of coordination: Secondary | ICD-10-CM

## 2016-07-06 DIAGNOSIS — R29818 Other symptoms and signs involving the nervous system: Secondary | ICD-10-CM | POA: Diagnosis not present

## 2016-07-06 DIAGNOSIS — I358 Other nonrheumatic aortic valve disorders: Secondary | ICD-10-CM | POA: Diagnosis not present

## 2016-07-06 DIAGNOSIS — R2681 Unsteadiness on feet: Secondary | ICD-10-CM

## 2016-07-06 DIAGNOSIS — Z7901 Long term (current) use of anticoagulants: Secondary | ICD-10-CM | POA: Diagnosis not present

## 2016-07-06 DIAGNOSIS — G243 Spasmodic torticollis: Secondary | ICD-10-CM | POA: Diagnosis not present

## 2016-07-06 DIAGNOSIS — R41842 Visuospatial deficit: Secondary | ICD-10-CM

## 2016-07-06 MED ORDER — CARBIDOPA-LEVODOPA 25-100 MG PO TABS: 2.0000 | ORAL_TABLET | Freq: Three times a day (TID) | ORAL | Status: DC

## 2016-07-06 NOTE — Therapy (Signed)
[facility] [address] [place], [place], [address] Phone: [phone]   Fax:  [phone]  Occupational Therapy Treatment  Patient Details  Name: [name] MRN: [id number] Date of Birth: April 08, 1941 Referring Provider: Dr. Wells Guiles Tat  Encounter Date: 07/06/2016      OT End of Session - 07/06/16 0819    Visit Number 10   Number of Visits 18  10+[phone]=18   Date for OT Re-Evaluation 07/12/16   Authorization Type [employer]; no visit limit or auth; G-code   Authorization Time Period re-cert. 07/06/16-09/04/16   Authorization - Visit Number 10   Authorization - Number of Visits 20   OT Start Time 0805   OT Stop Time 0845   OT Time Calculation (min) 40 min   Activity Tolerance Patient tolerated treatment well   Behavior During Therapy [facility] for tasks assessed/performed      Past Medical History  Diagnosis Date  . Aortic stenosis, severe   . HTN (hypertension)   . Hypercholesteremia   . PVC's (premature ventricular contractions)   . A-fib (HCC)     paroxysmal  . Venous varices    . GERD (gastroesophageal reflux disease)   . Breast CA ([place]) 2004  . Chest pain   . Herpes zoster infection     History of herpes zoster infection  . Hypercholesterolemia   . Obesity   . History of kidney stones   . History of bronchitis   . Mucositis     Severe mucositis  . Irritable bowel syndrome   . History of hiatal hernia   . PONV (postoperative nausea and vomiting)   . Hemifacial spasm     Past Surgical History  Procedure Laterality Date  . Aortic valve replacement  04/13/06    St. Jude mechanical conduit  . Total abdominal hysterectomy    . Cystocele repair    . Rectocele repair    . Appendectomy    . Kidney stone surgery    . Foot surgery    . Cataract surgery      bilateral  . Esophageal manometry N/A 08/12/2014    Procedure: ESOPHAGEAL MANOMETRY (EM);  Surgeon: [name], MD;  Location: [facility] ENDOSCOPY;   Service: Endoscopy;  Laterality: N/A;  . Esophagogastroduodenoscopy N/A 10/03/2014    Procedure: ESOPHAGOGASTRODUODENOSCOPY (EGD);  Surgeon: [name], MD;  Location[facility] ENDOSCOPY;  Service: Endoscopy;  Laterality: N/A;  . Colonoscopy N/A 10/03/2014    Procedure: COLONOSCOPY;  Surgeon: [name], MD;  Location: [facility] [place];  Service: Endoscopy;  Laterality: N/A;    There were no vitals filed for this visit.      Subjective Assessment - 07/06/16 0807    Subjective  Pt reports that the injection seemed to help her neck a little.  Pt reports that buttons seem to be better.  "I'm trying to slow down"   Pertinent History Newly diagnosed Parkinsonism (? PSP); hx of multiple falls; hx of L breast CA (treated with chemo, radiation, mastectomy/reconstruction 2004); heart valve replacement; PVC's; HTN; macular degeneration   Patient Stated Goals improve writing   Currently in Pain? Yes   Pain Score 3    Pain Location Neck   Pain Orientation Posterior   Pain Descriptors / Indicators Aching   Pain Type Chronic pain   Aggravating Factors  movement   Pain Relieving Factors inactivity        In standing, functional step and reach diagonally (back/forward in prep for turns) with each  UE/LE to flip large cards with focus on timing and coordination of UE/LE, min v.c Given for timing   Sliding cards off table by using PWR! Hands with focus on finger ext and min cues for incr movement amplitude each hand.  Checking goals--see goals section for updates and discussed progress.                        OT Education - 07/06/16 1327    Education Details Community Resources related to PD/upcoming events; Differences between PD and PSP based on pt questions   Person(s) Educated Patient   Methods Explanation;Handout   Comprehension Verbalized understanding          OT Short Term Goals - 07/06/16 0828    OT SHORT TERM GOAL #1   Title Pt will be independent with PD-specific  HEP.--check STGs 06/12/16   Time 4   Period Weeks   Status On-going   OT SHORT TERM GOAL #2   Title Pt will report improved ability to cut food and decr spills when eating.   Time 4   Period Weeks   Status Achieved  07/06/16 met   OT SHORT TERM GOAL #3   Title Pt will report incr ease with donning shoes and perform in reasonable amount of time.   Time 4   Period Weeks   Status On-going   OT SHORT TERM GOAL #4   Title Pt will report incr ease with fastening bra and buttons.   Time 4   Period Weeks   Status Achieved  07/06/16: can fasten bra in front but not back, incr ease with buttons   OT SHORT TERM GOAL #5   Title Pt will report incr ease turning in bed using large amplitude movement strategies.   Time 4   Period Weeks   Status Achieved  06/17/16   OT SHORT TERM GOAL #6   Title Pt will write at least 3 sentences with at least 90% legibility and only min decr in size.   Baseline legibility 50% cursive, 75% print     Time 4   Period Weeks   Status Achieved  06/15/16:  75% cursive, 90% print with only min decr in size (recommended printing)           OT Long Term Goals - 07/06/16 0815    OT LONG TERM GOAL #1   Title Pt will verbalize understanding of adaptive strategies/AE for ADLs/IADLs prn.--check LTGs 07/12/16   Time 8   Period Weeks   Status On-going   OT LONG TERM GOAL #2   Title Pt will improve coordination for ADLs as shown by reducing time on 9-hole peg test by at least 5 sec with RUE.   Baseline  32.50   Time 8   Period Weeks   Status Achieved  07/06/16:  27.47sec   OT LONG TERM GOAL #3   Title Pt will improve coordination/functional reaching as shown by improving box and blocks test by at least 5 bilaterally.   Baseline R-44, L-40 blocks   Time 8   Period Weeks   Status Achieved  07/06/16:  R-50blocks, L-52blocks   OT LONG TERM GOAL #4   Title Pt will improve functional reaching/balance for IADLs as shown by improving standing functional reach by at least  2" bilaterally.   Baseline 8" bilaterally   Time 8   Period Weeks   Status On-going   OT LONG TERM GOAL #5   Title Pt  will write at least 3 sentences with at least 90% legibility and only min decr in size.   Baseline legibility 50% cursive, 75% print, mod micrographia   Time 8   Period Weeks   Status On-going   OT LONG TERM GOAL #6   Title Pt will verbalize understanding of ways to prevent future complications and appropriate community resources prn.   Time 8   Period Weeks   Status Achieved  2016/08/02 met               Plan - Aug 02, 2016 1240    Clinical Impression Statement Pt has made good progress with improved movement timing and coordination and is progressing toward remaining goals.  Pt has not been seen for frequency due to scheduling conflicts which has impacted progress.  Pt would benefit from continued occupational therapy to reinforce movement strategies and timing deficits as well as safety.    Rehab Potential Good   Clinical Impairments Affecting Rehab Potential hx of falls, possible PSP   OT Frequency 2x / week   OT Duration 4 weeks  re-cert. August 02, 2016   OT Treatment/Interventions Self-care/ADL training;Therapeutic exercise;Functional Mobility Training;Patient/family education;Balance training;Manual Therapy;Neuromuscular education;Ultrasound;Energy conservation;Therapeutic exercises;Cryotherapy;Parrafin;DME and/or AE instruction;Therapeutic activities;Cognitive remediation/compensation;Fluidtherapy;Electrical Stimulation;Moist Heat;Passive range of motion;Visual/perceptual remediation/compensation   Plan continue with unmet goals, review HEP; donning/doffing shoes   OT Home Exercise Plan Education provided:  coordination HEP with focus on timing and big movements, PWR! hands (basic 4), initiated prevention of future complications; PWR! supine   Consulted and Agree with Plan of Care Patient      Patient will benefit from skilled therapeutic intervention in order to  improve the following deficits and impairments:  Abnormal gait, Decreased coordination, Decreased range of motion, Difficulty walking, Impaired flexibility, Increased edema, Decreased safety awareness, Decreased endurance, Decreased activity tolerance, Decreased knowledge of precautions, Impaired tone, Pain, Impaired UE functional use, Decreased scar mobility, Decreased knowledge of use of DME, Decreased balance, Decreased cognition, Decreased mobility, Decreased strength  Visit Diagnosis: Other symptoms and signs involving the nervous system  Other lack of coordination  Unsteadiness on feet  Visuospatial deficit      G-Codes - 08-02-16 0817    Functional Assessment Tool Used 9-hole peg test:  R-27.49sec; box and blocks test:  R-50, L-52 blocks; standing functional reach:  8" bilaterally; 75% cursive, 90% print with only min decr in size    Functional Limitation Carrying, moving and handling objects   Carrying, Moving and Handling Objects Current Status [phone]) At least 20 percent but less than 40 percent impaired, limited or restricted   Carrying, Moving and Handling Objects Goal Status ([id number]) At least 1 percent but less than 20 percent impaired, limited or restricted      Occupational Therapy Progress Note  Dates of Reporting Period: 05/13/16 to 2016-08-02  Objective Reports of Subjective Statement: see above   Objective Measurements: see above  Goal Update: see above  Plan: see above  Reason Skilled Services are Required: see above.    Problem List Patient Active Problem List   Diagnosis Date Noted  . Family history of colon cancer 07/30/2014  . Hiatal hernia 07/30/2014  . Chest pain 10/13/2011  . Disequilibrium 10/13/2011  . Heart valve replaced by other means 04/16/2011  . Anticoagulant long-term use 03/17/2011  . S/P AVR (aortic valve replacement)   . PVC's (premature ventricular contractions)   . HTN (hypertension)   . Hypercholesteremia   . A-fib ([place])   . GERD  (gastroesophageal reflux disease)   .  GERD 02/06/2010  . DIVERTICULOSIS-COLON 02/06/2010  . PERSONAL HX COLONIC POLYPS 02/06/2010  . MASTECTOMY, HX OF 02/06/2010     [facility] 07/06/2016, 4:16 PM  [facility] [address] [place], [place], [address] Phone: [phone]   Fax:  [phone]  Name: [name] MRN: [id number] Date of Birth: 09/15/1941  Vianne Bulls, OTR/L [facility] [address] [place], [place]  [address] [phone] phone [phone] 07/06/2016 4:16 PM

## 2016-07-06 NOTE — Progress Notes (Signed)
[name] was seen today in the movement disorders clinic for neurologic consultation at the request of Dr Tomi Likens for a 2nd opinion regarding gait instability.  She is accompanied by her husband who supplements the hx.  The records that were made available to me were reviewed.  Pt reports that she has been having falls, the first of which was several years ago but then she began to have more regular falls 6-8 months ago.  Most falls are when she turns and she notes that the feet will shuffle and the "body will turn and the feet will stay."  Her husband states that her feet will stay like "they are stuck on bumble gum."  No falls in last 2 weeks.  She has been in rehab recently.    07/06/16 update:  The patient is following up today regarding probable PSP.  Went to the atypical symposium I had recommended and they thought that it was very informative.  She had a modified barium swallow on 05/20/2016 that recommended a regular diet with thin liquids.  She has been participating with physical and occupational therapy since last visit.  I started her on levodopa (6am/12noon/5pm) and she has not noticed a great difference with that. No SE.  Has had 2 falls.  With one she tripped over her cane and with one she fell changing her sheets.   This was very low dose, and a benefit was not expected at this low dose.  She did see Dr. Rexene Alberts in 06/22/2016 for sleep apnea.  A sleep study was recommended.  Pt states that she hasn't scheduled that yet.  States that she had an injection in neck for pain 2 weeks ago and thinks that it has helped some.  States that she is contemplating cervical spine surgery.  She did have an MRI of the cervical spine on 06/03/2016 that demonstrated severe C2-C3 left facet arthropathy with joint effusion and bone marrow edema.  PREVIOUS MEDICATIONS: none to date  ALLERGIES:   Allergies  Allergen Reactions  . Meperidine Hcl Shortness Of Breath and Swelling    Aka Demerol  . Oxycodone  Other (See Comments)    Hallucinations   . Ivp Dye [Iodinated Diagnostic Agents] Hives    OK with benadryl pre-med (50mg  one hour before receiving iodinated contrast agent)    CURRENT MEDICATIONS:  Outpatient Encounter Prescriptions as of 07/06/2016  Medication Sig  . Ascorbic Acid (VITAMIN C) 1000 MG tablet Take 1,000 mg by mouth 3 (three) times daily.    . calcium citrate-vitamin D (CITRACAL+D) 315-200 MG-UNIT per tablet Take 1 tablet by mouth 3 (three) times daily.  . carbidopa-levodopa (SINEMET IR) 25-100 MG tablet Take 1 tablet by mouth 3 (three) times daily.  . Cholecalciferol (VITAMIN D3) 1000 UNITS tablet Take 2,000 Units by mouth daily.   Marland Kitchen enoxaparin (LOVENOX) 80 MG/0.8ML injection   . famotidine-calcium carbonate-magnesium hydroxide (PEPCID COMPLETE) 10-800-165 MG CHEW chewable tablet Chew 1 tablet by mouth daily as needed (heartburn).  . metoprolol succinate (TOPROL XL) 25 MG 24 hr tablet Take 1 tablet (25 mg total) by mouth daily.  . Multiple Vitamin (MULTIVITAMIN) tablet Take 1 tablet by mouth daily at 12 noon.   . Multiple Vitamins-Minerals (VISION-VITE PRESERVE PO) Take 1 tablet by mouth 2 (two) times daily.  Marland Kitchen oxybutynin (DITROPAN) 5 MG tablet Take 5 mg by mouth daily.  . pantoprazole (PROTONIX) 40 MG tablet Take 40 mg by mouth daily.   . simvastatin (ZOCOR) 20 MG tablet  Take 20 mg by mouth every evening.  . triamterene-hydrochlorothiazide (MAXZIDE-25) 37.5-25 MG per tablet Take 1 tablet by mouth daily.  Marland Kitchen UNABLE TO FIND Take 900 mg by mouth daily. Omega Red  . vitamin B-12 (CYANOCOBALAMIN) 1000 MCG tablet Take 1,000 mcg by mouth every morning.  . warfarin (COUMADIN) 5 MG tablet Take 5 mg by mouth every evening. Reported on 07/06/2016   No facility-administered encounter medications on file as of 07/06/2016.    PAST MEDICAL HISTORY:   Past Medical History  Diagnosis Date  . Aortic stenosis, severe   . HTN (hypertension)   . Hypercholesteremia   . PVC's (premature  ventricular contractions)   . A-fib (HCC)     paroxysmal  . Venous varices    . GERD (gastroesophageal reflux disease)   . Breast CA ([place]) 2004  . Chest pain   . Herpes zoster infection     History of herpes zoster infection  . Hypercholesterolemia   . Obesity   . History of kidney stones   . History of bronchitis   . Mucositis     Severe mucositis  . Irritable bowel syndrome   . History of hiatal hernia   . PONV (postoperative nausea and vomiting)   . Hemifacial spasm     PAST SURGICAL HISTORY:   Past Surgical History  Procedure Laterality Date  . Aortic valve replacement  04/13/06    St. Jude mechanical conduit  . Total abdominal hysterectomy    . Cystocele repair    . Rectocele repair    . Appendectomy    . Kidney stone surgery    . Foot surgery    . Cataract surgery      bilateral  . Esophageal manometry N/A 08/12/2014    Procedure: ESOPHAGEAL MANOMETRY (EM);  Surgeon: [name], MD;  Location: [facility] ENDOSCOPY;  Service: Endoscopy;  Laterality: N/A;  . Esophagogastroduodenoscopy N/A 10/03/2014    Procedure: ESOPHAGOGASTRODUODENOSCOPY (EGD);  Surgeon: [name], MD;  Location[facility] ENDOSCOPY;  Service: Endoscopy;  Laterality: N/A;  . Colonoscopy N/A 10/03/2014    Procedure: COLONOSCOPY;  Surgeon: [name], MD;  Location: [place];  Service: Endoscopy;  Laterality: N/A;    SOCIAL HISTORY:   Social History   Social History  . Marital Status: Married    Spouse Name: N/A  . Number of Children: 3  . Years of Education: 10   Occupational History  . bakery/caterer Other    retired   Social History Main Topics  . Smoking status: Never Smoker   . Smokeless tobacco: Never Used  . Alcohol Use: No  . Drug Use: No  . Sexual Activity: Not on file   Other Topics Concern  . Not on file   Social History Narrative      1/2 -1 soda a day      FAMILY HISTORY:   Family Status  Relation Status Death Age  . Mother Deceased 76  . Father Deceased 34  .  Brother Deceased     colon cancer  . Brother Deceased     accident  . Brother Deceased     accident  . Brother Alive     colon cancer  . Brother Other   . Sister Alive   . Sister Alive   . Sister Alive   . Maternal Grandmother Deceased   . Maternal Grandfather Deceased   . Paternal Grandmother Deceased   . Paternal Grandfather Deceased     ROS:  A complete  10 system review of systems was obtained and was unremarkable apart from what is mentioned above.  PHYSICAL EXAMINATION:    VITALS:   Filed Vitals:   07/06/16 1038  BP: 130/88  Pulse: 55  Height: 4' 11.75" (1.518 m)  Weight: 141 lb (63.957 kg)    GEN:  The patient appears stated age and is in NAD. HEENT:  Normocephalic, atraumatic.  The mucous membranes are moist. The superficial temporal arteries are without ropiness or tenderness. CV:  RRR with 3/6 SEM Lungs:  CTAB Neck/HEME:  There are no carotid bruits bilaterally.  Head is turned to the left with geste antagoniste.  Head is mildly flexed.  Neurological examination:  Orientation: The patient is alert and oriented x3.  Cranial nerves: There is good facial symmetry.  There is facial hypomimia.   There is rare L hemifacial spasm. Extraocular muscles are intact. No square wave jerks.  The visual fields are full to confrontational testing. The speech is fluent and clear. She is hypophonic.  Minimal trouble with gutteral sounds.  Soft palate rises symmetrically and there is no tongue deviation. Hearing is intact to conversational tone. Sensation: Sensation is intact to light touch throughout. Motor: Strength is 5/5 in the bilateral upper and lower extremities.   Shoulder shrug is equal and symmetric.  There is no pronator drift.   Movement examination: Tone: There is normal tone in the bilateral upper extremities.  The tone in the lower extremities is normal.  Abnormal movements: rare tremor in the RUE Coordination:  There is decremation with RAM's, seen with heel  taps/toe taps bilaterally Gait and Station: The patient has no difficulty arising out of a deep-seated chair without the use of the hands. The patient's stride length is improved with improved stride length.  She turns en bloc.  The patient has a negative pull test.      ASSESSMENT/PLAN:  1.  Parkinsonism  -I do not think that the patient has idiopathic Parkinsons Disease but rather one of the atypical parkinsonian states, and possibly Progressive Supranuclear Palsy (PSP).  We talked about nature, etiology and pathophysiology. We talked about how the symptoms, course and prognosis differ from Parkinson's Disease.  We talked about the risks, particularly for falls and aspiration.   -Looks better on low dose levodopa and I am going to slowly titrate her a set to carbidopa/levodopa 25/100, 2 tablets 3 times per day.  -Talked about the study regarding PSP and gave them information on this and signed them up online. 2.  Neck pain  -I think this is multifactorial.  She did have an MRI of the cervical spine on 06/03/2016 that demonstrated severe C2-C3 left facet arthropathy with joint effusion and bone marrow edema.  She has received injections for this.  She also has evidence of cervical dystonia.  Passed me about surgical interventions for facet arthropathy.  I hope that she does not have to proceed with that and hopefully that we continue with the arthropathy in other medical ways. 3.  I plan on seeing the patient back in the next 3-4 months, sooner should new neurologic issues arise.  Much greater than 50% of this 40 minute visit spent in counseling with the patient and her husband.  They asked multiple questions and I answered them to the best of my ability.

## 2016-07-06 NOTE — Patient Instructions (Signed)
Increase Carbidopa Levodopa 25/100 - 2 tablet in the morning, 1 tablet in the afternoon, 1 tablet in the evening for one week 2 tablets in the morning, 1 tablet in the afternoon, 2 tablets in the evening for one week 2 tablets in the morning, 2 tablets in the afternoon, 2 tablets in the evening

## 2016-07-08 ENCOUNTER — Ambulatory Visit: Payer: PPO | Admitting: Occupational Therapy

## 2016-07-08 DIAGNOSIS — R2681 Unsteadiness on feet: Secondary | ICD-10-CM

## 2016-07-08 DIAGNOSIS — R2689 Other abnormalities of gait and mobility: Secondary | ICD-10-CM

## 2016-07-08 DIAGNOSIS — R278 Other lack of coordination: Secondary | ICD-10-CM

## 2016-07-08 DIAGNOSIS — R29818 Other symptoms and signs involving the nervous system: Secondary | ICD-10-CM | POA: Diagnosis not present

## 2016-07-08 DIAGNOSIS — R41842 Visuospatial deficit: Secondary | ICD-10-CM

## 2016-07-08 NOTE — Patient Instructions (Signed)
Visual HEP:  Perform at least 1-2 times per day. Stop if your eye becomes fatigued or hurts and try again later.  1. Hold a small object/card in front of you.  Hold it in the middle at arm's length away.    2. Slowly move the object side to side in front of you while continuing to watch it with your eyes, do not move your head .  3.  Remember to keep your head still and only move your eyes.  4.  Repeat 5-10 times.  5.  Then, move object up and down while watching it 5-10 times.  6.  If image starts to double stop and try to make image one if possible before continuing.

## 2016-07-08 NOTE — Therapy (Signed)
[facility] [address] [place], [place], [address] Phone: [phone]   Fax:  [phone]  Occupational Therapy Treatment  Patient Details  Name: [name] MRN: [id number] Date of Birth: [birth year] Referring Provider: Dr. Wells Guiles Tat  Encounter Date: 07/08/2016      OT End of Session - 07/08/16 0925    Visit Number 11   Number of Visits 18  10+8=18   Date for OT Re-Evaluation 07/12/16   Authorization Type [employer]; no visit limit or auth; G-code   Authorization Time Period re-cert. 07/06/16-09/04/16   Authorization - Visit Number 11   Authorization - Number of Visits 20   OT Start Time 0805   OT Stop Time 0850   OT Time Calculation (min) 45 min   Activity Tolerance Patient tolerated treatment well   Behavior During Therapy [facility] for tasks assessed/performed      Past Medical History  Diagnosis Date  . Aortic stenosis, severe   . HTN (hypertension)   . Hypercholesteremia   . PVC's (premature ventricular contractions)   . A-fib (HCC)     paroxysmal  . Venous varices    . GERD (gastroesophageal reflux disease)   . Breast CA ([place]) 2004  . Chest pain   . Herpes zoster infection     History of herpes zoster infection  . Hypercholesterolemia   . Obesity   . History of kidney stones   . History of bronchitis   . Mucositis     Severe mucositis  . Irritable bowel syndrome   . History of hiatal hernia   . PONV (postoperative nausea and vomiting)   . Hemifacial spasm     Past Surgical History  Procedure Laterality Date  . Aortic valve replacement  04/13/06    St. Jude mechanical conduit  . Total abdominal hysterectomy    . Cystocele repair    . Rectocele repair    . Appendectomy    . Kidney stone surgery    . Foot surgery    . Cataract surgery      bilateral  . Esophageal manometry N/A 08/12/2014    Procedure: ESOPHAGEAL MANOMETRY (EM);  Surgeon: [name], MD;  Location: [facility];   Service: Endoscopy;  Laterality: N/A;  . Esophagogastroduodenoscopy N/A 10/03/2014    Procedure: ESOPHAGOGASTRODUODENOSCOPY (EGD);  Surgeon: [name], MD;  Location: [facility] END[facility];  Service: Endoscopy;  Laterality: N/A;  . Colonoscopy N/A 10/03/2014    Procedure: COLONOSCOPY;  Surgeon: [name], MD;  Location: [facility];  Service: Endoscopy;  Laterality: N/A;    There were no vitals filed for this visit.      Subjective Assessment - 07/08/16 0914    Subjective  Pt reports that Dr. Carles Collet wants her to slowly incr PD meds.   Pertinent History Newly diagnosed Parkinsonism (? PSP); hx of multiple falls; hx of L breast CA (treated with chemo, radiation, mastectomy/reconstruction 2004); heart valve replacement; PVC's; HTN; macular degeneration   Patient Stated Goals improve writing   Currently in Pain? Yes   Pain Score 3    Pain Location Neck   Pain Orientation Proximal   Pain Descriptors / Indicators Aching   Pain Type Chronic pain   Pain Onset More than a month ago   Pain Frequency Constant   Aggravating Factors  movement   Pain Relieving Factors inactivity          Neuro re-ed:  PWR! Moves (basic 4) in supine and sitting x 10-20  each with min cues For incr movement amplitude and timing, particularly with PWR! Step in both positions.  Recommended pt perform HEP to slower tempo music to help with timing difficulties.  Reinforced importance of head/upper trunk turns when reaching and using eye movements with functional reach/exercises.  Pt verbalized understanding.  Discussed functional differences between PSP and idiopathic PD (per pt questions) related to timing and visual changes.  Pt verbalized understanding.  Also recommended pt return for re-evaluations for PT, OT, and ST in approx 6 months due to progressive nature.  Pt educated in indications to return to therapy earlier or to discuss with MD prn.  Pt instructed in changes in swallowing and voice volume associated with  Parkinsonisms.  Pt verbalized understanding.                       OT Education - 07/08/16 0916    Education Details PWR! Moves in sitting (basic 4) with 10 reps except only 5 to each side for rock (due to neck limitations);  Visual HEP (see pt instructions)   Person(s) Educated Patient   Methods Explanation;Demonstration;Handout;Verbal cues   Comprehension Verbalized understanding;Returned demonstration;Verbal cues required          OT Short Term Goals - 07/08/16 0928    OT SHORT TERM GOAL #1   Title Pt will be independent with PD-specific HEP.--check STGs 06/12/16   Time 4   Period Weeks   Status On-going   OT SHORT TERM GOAL #2   Title Pt will report improved ability to cut food and decr spills when eating.   Time 4   Period Weeks   Status Achieved  07/06/16 met   OT SHORT TERM GOAL #3   Title Pt will report incr ease with donning shoes and perform in reasonable amount of time.   Time 4   Period Weeks   Status Achieved  07/08/16   OT SHORT TERM GOAL #4   Title Pt will report incr ease with fastening bra and buttons.   Time 4   Period Weeks   Status Achieved  07/06/16: can fasten bra in front but not back, incr ease with buttons   OT SHORT TERM GOAL #5   Title Pt will report incr ease turning in bed using large amplitude movement strategies.   Time 4   Period Weeks   Status Achieved  06/17/16   OT SHORT TERM GOAL #6   Title Pt will write at least 3 sentences with at least 90% legibility and only min decr in size.   Baseline legibility 50% cursive, 75% print     Time 4   Period Weeks   Status Achieved  06/15/16:  75% cursive, 90% print with only min decr in size (recommended printing)           OT Long Term Goals - 07/06/16 0815    OT LONG TERM GOAL #1   Title Pt will verbalize understanding of adaptive strategies/AE for ADLs/IADLs prn.--check LTGs 07/12/16   Time 8   Period Weeks   Status On-going   OT LONG TERM GOAL #2   Title Pt will  improve coordination for ADLs as shown by reducing time on 9-hole peg test by at least 5 sec with RUE.   Baseline  32.50   Time 8   Period Weeks   Status Achieved  07/06/16:  27.47sec   OT LONG TERM GOAL #3   Title Pt will improve coordination/functional reaching as shown  by improving box and blocks test by at least 5 bilaterally.   Baseline R-44, L-40 blocks   Time 8   Period Weeks   Status Achieved  07/06/16:  R-50blocks, L-52blocks   OT LONG TERM GOAL #4   Title Pt will improve functional reaching/balance for IADLs as shown by improving standing functional reach by at least 2" bilaterally.   Baseline 8" bilaterally   Time 8   Period Weeks   Status On-going   OT LONG TERM GOAL #5   Title Pt will write at least 3 sentences with at least 90% legibility and only min decr in size.   Baseline legibility 50% cursive, 75% print, mod micrographia   Time 8   Period Weeks   Status On-going   OT LONG TERM GOAL #6   Title Pt will verbalize understanding of ways to prevent future complications and appropriate community resources prn.   Time 8   Period Weeks   Status Achieved  07/06/16 met               Plan - 07/08/16 0925    Clinical Impression Statement Pt has made progress with movement timing and reports decr neck pain and incr tolerance to trunk rotation by incorporting UE/shoulder rotation instead of turning head in isolation.     Rehab Potential Good   Clinical Impairments Affecting Rehab Potential hx of falls, possible PSP   OT Frequency 2x / week   OT Duration 4 weeks  re-cert. 07/06/16   OT Treatment/Interventions Self-care/ADL training;Therapeutic exercise;Functional Mobility Training;Patient/family education;Balance training;Manual Therapy;Neuromuscular education;Ultrasound;Energy conservation;Therapeutic exercises;Cryotherapy;Parrafin;DME and/or AE instruction;Therapeutic activities;Cognitive remediation/compensation;Fluidtherapy;Electrical Stimulation;Moist Heat;Passive  range of motion;Visual/perceptual remediation/compensation   Plan check remaining goals, anticipate d/c, review visual HEP/coordination HEP prn; schedule follow-up OT, PT, ST evals in approx 6 months   OT Home Exercise Plan Education provided:  coordination HEP with focus on timing and big movements, PWR! hands (basic 4), initiated prevention of future complications; PWR! supine   Consulted and Agree with Plan of Care Patient      Patient will benefit from skilled therapeutic intervention in order to improve the following deficits and impairments:  Abnormal gait, Decreased coordination, Decreased range of motion, Difficulty walking, Impaired flexibility, Increased edema, Decreased safety awareness, Decreased endurance, Decreased activity tolerance, Decreased knowledge of precautions, Impaired tone, Pain, Impaired UE functional use, Decreased scar mobility, Decreased knowledge of use of DME, Decreased balance, Decreased cognition, Decreased mobility, Decreased strength  Visit Diagnosis: Other symptoms and signs involving the nervous system  Other lack of coordination  Unsteadiness on feet  Visuospatial deficit  Other abnormalities of gait and mobility    Problem List Patient Active Problem List   Diagnosis Date Noted  . Family history of colon cancer 07/30/2014  . Hiatal hernia 07/30/2014  . Chest pain 10/13/2011  . Disequilibrium 10/13/2011  . Heart valve replaced by other means 04/16/2011  . Anticoagulant long-term use 03/17/2011  . S/P AVR (aortic valve replacement)   . PVC's (premature ventricular contractions)   . HTN (hypertension)   . Hypercholesteremia   . A-fib ([place])   . GERD (gastroesophageal reflux disease)   . GERD 02/06/2010  . DIVERTICULOSIS-COLON 02/06/2010  . PERSONAL HX COLONIC POLYPS 02/06/2010  . MASTECTOMY, HX OF 02/06/2010    [facility] 07/08/2016, 9:21 PM  [facility] [address] [place], [place], [address] Phone: [phone]   Fax:  [phone]  Name: PHILAMENA KRAMAR MRN: [id number] Date of Birth: 1941-09-07  Vianne Bulls,  OTR/L [facility] [place], [place]  [address] [phone] phone [phone] 07/08/2016 9:21 PM

## 2016-07-15 ENCOUNTER — Ambulatory Visit: Payer: PPO | Admitting: Occupational Therapy

## 2016-07-15 DIAGNOSIS — R278 Other lack of coordination: Secondary | ICD-10-CM

## 2016-07-15 DIAGNOSIS — R29818 Other symptoms and signs involving the nervous system: Secondary | ICD-10-CM

## 2016-07-15 DIAGNOSIS — R41842 Visuospatial deficit: Secondary | ICD-10-CM

## 2016-07-15 DIAGNOSIS — R2689 Other abnormalities of gait and mobility: Secondary | ICD-10-CM

## 2016-07-15 DIAGNOSIS — R2681 Unsteadiness on feet: Secondary | ICD-10-CM

## 2016-07-15 DIAGNOSIS — I358 Other nonrheumatic aortic valve disorders: Secondary | ICD-10-CM | POA: Diagnosis not present

## 2016-07-15 DIAGNOSIS — Z7901 Long term (current) use of anticoagulants: Secondary | ICD-10-CM | POA: Diagnosis not present

## 2016-07-15 NOTE — Therapy (Signed)
[facility] [address] [place], [place], [address] Phone: [phone]   Fax:  [phone]  Occupational Therapy Treatment  Patient Details  Name: [name] MRN: [id number] Date of Birth: 31-Mar-1941 Referring Provider: Dr. Wells Guiles Tat  Encounter Date: 07/15/2016      OT End of Session - 07/15/16 0811    Visit Number 12   Number of Visits 18  10+8=18   Date for OT Re-Evaluation 07/12/16   Authorization Type [employer]; no visit limit or auth; G-code   Authorization Time Period re-cert. 07/06/16-09/04/16   Authorization - Visit Number 12   Authorization - Number of Visits 20   OT Start Time 0804   OT Stop Time 0845   OT Time Calculation (min) 41 min   Activity Tolerance Patient tolerated treatment well   Behavior During Therapy [facility] for tasks assessed/performed      Past Medical History  Diagnosis Date  . Aortic stenosis, severe   . HTN (hypertension)   . Hypercholesteremia   . PVC's (premature ventricular contractions)   . A-fib (HCC)     paroxysmal  . Venous varices    . GERD (gastroesophageal reflux disease)   . Breast CA ([place]) 2004  . Chest pain   . Herpes zoster infection     History of herpes zoster infection  . Hypercholesterolemia   . Obesity   . History of kidney stones   . History of bronchitis   . Mucositis     Severe mucositis  . Irritable bowel syndrome   . History of hiatal hernia   . PONV (postoperative nausea and vomiting)   . Hemifacial spasm     Past Surgical History  Procedure Laterality Date  . Aortic valve replacement  04/13/06    St. Jude mechanical conduit  . Total abdominal hysterectomy    . Cystocele repair    . Rectocele repair    . Appendectomy    . Kidney stone surgery    . Foot surgery    . Cataract surgery      bilateral  . Esophageal manometry N/A 08/12/2014    Procedure: ESOPHAGEAL MANOMETRY (EM);  Surgeon: [name], MD;  Location: [facility];   Service: Endoscopy;  Laterality: N/A;  . Esophagogastroduodenoscopy N/A 10/03/2014    Procedure: ESOPHAGOGASTRODUODENOSCOPY (EGD);  Surgeon: [name], MD;  Location: [facility] ENDOSCOPY;  Service: Endoscopy;  Laterality: N/A;  . Colonoscopy N/A 10/03/2014    Procedure: COLONOSCOPY;  Surgeon: [name], MD;  Location: [facility];  Service: Endoscopy;  Laterality: N/A;    There were no vitals filed for this visit.      Subjective Assessment - 07/15/16 0809    Subjective  Pt reports that Dr. Carles Collet wants her to slowly incr PD meds.   Pertinent History Newly diagnosed Parkinsonism (? PSP); hx of multiple falls; hx of L breast CA (treated with chemo, radiation, mastectomy/reconstruction 2004); heart valve replacement; PVC's; HTN; macular degeneration   Patient Stated Goals improve writing   Currently in Pain? Yes   Pain Score 2    Pain Location Neck   Pain Orientation Proximal   Pain Descriptors / Indicators Aching   Pain Type Chronic pain   Pain Onset More than a month ago   Pain Frequency Constant   Aggravating Factors  movement   Pain Relieving Factors inactivity         Self Care:     Checked remaining goals and discussed progress and recommendation  for re-eval in approx 6 months.  Instructed pt that if she notices a decline or incr falls prior to eval to let physician know/return to therapy earlier.  Writing:  Practiced copying 5 sentences with focus/min cues on letter formation/size.  Pt with min decr in size with approx 90% legibility in cursive and approx 90%legibility in print with self-corrections throughout.                  OT Treatments/Exercises (OP) - 07/15/16 0001    Fine Motor Coordination   Fine Motor Coordination Picking up coins;Dealing card with thumb;Flipping cards;Tossing ball;Stacking coins;Manipulating coins   Flipping cards  with focus on large amplitude movements/timing with each hand with good movement quality   Dealing card with thumb  with each hand with emphasis on large amplitude with min cues   Tossing ball with each hand and between hands with mod difficulty/cues for large amplitude movements, use vision (max difficulty), and for timing   Other Fine Motor Exercises rotating ball in each hand each direction with min difficulty/cues for large amplitude                OT Education - 07/15/16 0825    Education Details Reviewed Visual HEP   Person(s) Educated Patient   Methods Explanation;Demonstration;Verbal cues   Comprehension Verbalized understanding;Returned demonstration          OT Short Term Goals - 07/15/16 0813    OT SHORT TERM GOAL #1   Title Pt will be independent with PD-specific HEP.--check STGs 06/12/16   Time 4   Period Weeks   Status Achieved  07/15/16   OT SHORT TERM GOAL #2   Title Pt will report improved ability to cut food and decr spills when eating.   Time 4   Period Weeks   Status Achieved  07/06/16 met   OT SHORT TERM GOAL #3   Title Pt will report incr ease with donning shoes and perform in reasonable amount of time.   Time 4   Period Weeks   Status Achieved  07/08/16   OT SHORT TERM GOAL #4   Title Pt will report incr ease with fastening bra and buttons.   Time 4   Period Weeks   Status Achieved  07/06/16: can fasten bra in front but not back, incr ease with buttons   OT SHORT TERM GOAL #5   Title Pt will report incr ease turning in bed using large amplitude movement strategies.   Time 4   Period Weeks   Status Achieved  06/17/16   OT SHORT TERM GOAL #6   Title Pt will write at least 3 sentences with at least 90% legibility and only min decr in size.   Baseline legibility 50% cursive, 75% print     Time 4   Period Weeks   Status Achieved  06/15/16:  75% cursive, 90% print with only min decr in size (recommended printing)           OT Long Term Goals - 07/15/16 0812    OT LONG TERM GOAL #1   Title Pt will verbalize understanding of adaptive strategies/AE for  ADLs/IADLs prn.--check LTGs 07/12/16   Time 8   Period Weeks   Status Achieved  07/15/16   OT LONG TERM GOAL #2   Title Pt will improve coordination for ADLs as shown by reducing time on 9-hole peg test by at least 5 sec with RUE.   Baseline  32.50   Time 8  Period Weeks   Status Achieved  07/06/16:  27.47sec   OT LONG TERM GOAL #3   Title Pt will improve coordination/functional reaching as shown by improving box and blocks test by at least 5 bilaterally.   Baseline R-44, L-40 blocks   Time 8   Period Weeks   Status Achieved  07/06/16:  R-50blocks, L-52blocks   OT LONG TERM GOAL #4   Title Pt will improve functional reaching/balance for IADLs as shown by improving standing functional reach by at least 2" bilaterally.   Baseline 8" bilaterally   Time 8   Period Weeks   Status Achieved  08-07-16:  R-12", L-10"   OT LONG TERM GOAL #5   Title Pt will write at least 3 sentences with at least 90% legibility and only min decr in size.   Baseline legibility 50% cursive, 75% print, mod micrographia   Time 8   Period Weeks   Status Achieved  August 07, 2016:  met at approx this level.   OT LONG TERM GOAL #6   Title Pt will verbalize understanding of ways to prevent future complications and appropriate community resources prn.   Time 8   Period Weeks   Status Achieved  07/06/16 met               Plan - 08/07/16 0813    Clinical Impression Statement Pt has made good progress in therapy with improved movement timing, coordination, and amplitude.     Rehab Potential Good   Clinical Impairments Affecting Rehab Potential hx of falls, possible PSP   OT Frequency 2x / week   OT Duration 4 weeks  re-cert. 07/06/16   OT Treatment/Interventions Self-care/ADL training;Therapeutic exercise;Functional Mobility Training;Patient/family education;Balance training;Manual Therapy;Neuromuscular education;Ultrasound;Energy conservation;Therapeutic exercises;Cryotherapy;Parrafin;DME and/or AE  instruction;Therapeutic activities;Cognitive remediation/compensation;Fluidtherapy;Electrical Stimulation;Moist Heat;Passive range of motion;Visual/perceptual remediation/compensation   Plan d/c; schedule OT, PT, ST evals in approx 6 months    OT Home Exercise Plan Education provided:  coordination HEP with focus on timing and big movements, PWR! hands (basic 4), initiated prevention of future complications; PWR! supine   Consulted and Agree with Plan of Care Patient      Patient will benefit from skilled therapeutic intervention in order to improve the following deficits and impairments:  Abnormal gait, Decreased coordination, Decreased range of motion, Difficulty walking, Impaired flexibility, Increased edema, Decreased safety awareness, Decreased endurance, Decreased activity tolerance, Decreased knowledge of precautions, Impaired tone, Pain, Impaired UE functional use, Decreased scar mobility, Decreased knowledge of use of DME, Decreased balance, Decreased cognition, Decreased mobility, Decreased strength  Visit Diagnosis: Other symptoms and signs involving the nervous system  Other lack of coordination  Unsteadiness on feet  Visuospatial deficit  Other abnormalities of gait and mobility      G-Codes - 2016/08/07 0911    Functional Assessment Tool Used 9-hole peg test:  R-27.49sec; box and blocks test:  R-50, L-52 blocks; standing functional reach:  R-12", L-10"; 90% cursive, 90% print with only min decr in size    Functional Limitation Carrying, moving and handling objects   Carrying, Moving and Handling Objects Goal Status (H7026) At least 1 percent but less than 20 percent impaired, limited or restricted   Carrying, Moving and Handling Objects Discharge Status [phone]) At least 1 percent but less than 20 percent impaired, limited or restricted      Problem List Patient Active Problem List   Diagnosis Date Noted  . Family history of colon cancer 07/30/2014  . Hiatal hernia  07/30/2014  . Chest pain  10/13/2011  . Disequilibrium 10/13/2011  . Heart valve replaced by other means 04/16/2011  . Anticoagulant long-term use 03/17/2011  . S/P AVR (aortic valve replacement)   . PVC's (premature ventricular contractions)   . HTN (hypertension)   . Hypercholesteremia   . A-fib ([place])   . GERD (gastroesophageal reflux disease)   . GERD 02/06/2010  . DIVERTICULOSIS-COLON 02/06/2010  . PERSONAL HX COLONIC POLYPS 02/06/2010  . MASTECTOMY, HX OF 02/06/2010    OCCUPATIONAL THERAPY DISCHARGE SUMMARY  Visits from Start of Care: 12  Current functional level related to goals / functional outcomes: See above   Remaining deficits: Bradykinesia, timing and coordination deficits, decr balance/functional mobility for ADLs   Education / Equipment: Pt instructed in PD/PSP-specific HEP, strategies for ADLs/IADLs, appropriate community resources, and ways to prevent future complications.  Pt verbalized understanding of all education provided.  Plan: Patient agrees to discharge.  Patient goals were met. Patient is being discharged due to meeting the stated rehab goals.  Pt would benefit from re-evaluationin approx 6 months to assess for need for further therapy/functional changes due to progressive nature of diagnosis.  Pt verbalized agreement with plan. ?????       [facility] 07/15/2016, 9:13 AM  [facility] [address] [place], [place], [address] Phone: [phone]   Fax:  [phone]  Name: [name] MRN: [id number] Date of Birth: 12-Aug-1941  Vianne Bulls, OTR/L [facility] [address] [place], [place]  [address] [phone] phone [phone] 07/15/2016 9:13 AM

## 2016-07-27 DIAGNOSIS — R351 Nocturia: Secondary | ICD-10-CM | POA: Diagnosis not present

## 2016-07-27 DIAGNOSIS — R311 Benign essential microscopic hematuria: Secondary | ICD-10-CM | POA: Diagnosis not present

## 2016-07-27 DIAGNOSIS — R3121 Asymptomatic microscopic hematuria: Secondary | ICD-10-CM | POA: Diagnosis not present

## 2016-07-27 DIAGNOSIS — N3946 Mixed incontinence: Secondary | ICD-10-CM | POA: Diagnosis not present

## 2016-08-05 ENCOUNTER — Telehealth: Payer: Self-pay | Admitting: Neurology

## 2016-08-05 DIAGNOSIS — Z7901 Long term (current) use of anticoagulants: Secondary | ICD-10-CM | POA: Diagnosis not present

## 2016-08-05 DIAGNOSIS — I358 Other nonrheumatic aortic valve disorders: Secondary | ICD-10-CM | POA: Diagnosis not present

## 2016-08-05 NOTE — Telephone Encounter (Signed)
PT called and said she thinks her medication is causing her to not sleep and she would like a call back/[name] CB#[phone]

## 2016-08-05 NOTE — Telephone Encounter (Signed)
She is taking Levodopa at 6 am/ 12 pm/ 5 pm. She will keep dosage times the same and try Melatonin. She will call if not helping.

## 2016-08-05 NOTE — Telephone Encounter (Signed)
I cannot imagine from levodopa.  Find out what time she is taking it.  Can try melatonin 3 mg qhs

## 2016-08-05 NOTE — Telephone Encounter (Signed)
Spoke with patient. She states she has not been sleeping well for the last two weeks. Has only had five and a half hours of sleep since Sunday. She states this started when she increased her Carbidopa Levodopa to 2 tablets three times daily this Sunday. She also stated she has had blood in her urine for awhile and was just diagnosed and put on antibiotics for a UTI on Tuesday. I advised sleep issues are probably not from Levodopa but I would let Dr. Carles Collet know what is going on.

## 2016-08-10 DIAGNOSIS — N132 Hydronephrosis with renal and ureteral calculous obstruction: Secondary | ICD-10-CM | POA: Diagnosis not present

## 2016-08-10 DIAGNOSIS — R3121 Asymptomatic microscopic hematuria: Secondary | ICD-10-CM | POA: Diagnosis not present

## 2016-08-17 DIAGNOSIS — R351 Nocturia: Secondary | ICD-10-CM | POA: Diagnosis not present

## 2016-08-17 DIAGNOSIS — N3946 Mixed incontinence: Secondary | ICD-10-CM | POA: Diagnosis not present

## 2016-08-17 DIAGNOSIS — R350 Frequency of micturition: Secondary | ICD-10-CM | POA: Diagnosis not present

## 2016-08-28 ENCOUNTER — Emergency Department (HOSPITAL_COMMUNITY): Payer: PPO

## 2016-08-28 ENCOUNTER — Encounter (HOSPITAL_COMMUNITY): Payer: Self-pay | Admitting: Nurse Practitioner

## 2016-08-28 ENCOUNTER — Emergency Department (HOSPITAL_COMMUNITY)
Admission: EM | Admit: 2016-08-28 | Discharge: 2016-08-28 | Disposition: A | Discharge status: Home / Self Care | Payer: PPO | Attending: Emergency Medicine | Admitting: Emergency Medicine

## 2016-08-28 DIAGNOSIS — Y999 Unspecified external cause status: Secondary | ICD-10-CM | POA: Insufficient documentation

## 2016-08-28 DIAGNOSIS — R51 Headache: Secondary | ICD-10-CM | POA: Insufficient documentation

## 2016-08-28 DIAGNOSIS — M25512 Pain in left shoulder: Secondary | ICD-10-CM | POA: Diagnosis not present

## 2016-08-28 DIAGNOSIS — M542 Cervicalgia: Secondary | ICD-10-CM | POA: Diagnosis not present

## 2016-08-28 DIAGNOSIS — R110 Nausea: Secondary | ICD-10-CM | POA: Insufficient documentation

## 2016-08-28 DIAGNOSIS — W19XXXA Unspecified fall, initial encounter: Secondary | ICD-10-CM

## 2016-08-28 DIAGNOSIS — S4992XA Unspecified injury of left shoulder and upper arm, initial encounter: Secondary | ICD-10-CM | POA: Diagnosis not present

## 2016-08-28 DIAGNOSIS — Y939 Activity, unspecified: Secondary | ICD-10-CM | POA: Insufficient documentation

## 2016-08-28 DIAGNOSIS — Z79899 Other long term (current) drug therapy: Secondary | ICD-10-CM | POA: Diagnosis not present

## 2016-08-28 DIAGNOSIS — Z7901 Long term (current) use of anticoagulants: Secondary | ICD-10-CM | POA: Insufficient documentation

## 2016-08-28 DIAGNOSIS — Y9289 Other specified places as the place of occurrence of the external cause: Secondary | ICD-10-CM | POA: Diagnosis not present

## 2016-08-28 DIAGNOSIS — I10 Essential (primary) hypertension: Secondary | ICD-10-CM | POA: Diagnosis not present

## 2016-08-28 DIAGNOSIS — W228XXA Striking against or struck by other objects, initial encounter: Secondary | ICD-10-CM | POA: Diagnosis not present

## 2016-08-28 DIAGNOSIS — S199XXA Unspecified injury of neck, initial encounter: Secondary | ICD-10-CM | POA: Diagnosis not present

## 2016-08-28 DIAGNOSIS — Z853 Personal history of malignant neoplasm of breast: Secondary | ICD-10-CM | POA: Diagnosis not present

## 2016-08-28 DIAGNOSIS — S0990XA Unspecified injury of head, initial encounter: Secondary | ICD-10-CM | POA: Diagnosis not present

## 2016-08-28 LAB — BASIC METABOLIC PANEL
Anion gap: 7 (ref 5–15)
BUN: 17 mg/dL (ref 6–20)
CO2: 26 mmol/L (ref 22–32)
Calcium: 10.2 mg/dL (ref 8.9–10.3)
Chloride: 102 mmol/L (ref 101–111)
Creatinine, Ser: 1.21 mg/dL — ABNORMAL HIGH (ref 0.44–1.00)
GFR calc Af Amer: 49 mL/min — ABNORMAL LOW (ref >60)
GFR calc non Af Amer: 42 mL/min — ABNORMAL LOW (ref >60)
Glucose, Bld: 107 mg/dL — ABNORMAL HIGH (ref 65–99)
Potassium: 3.7 mmol/L (ref 3.5–5.1)
Sodium: 135 mmol/L (ref 135–145)

## 2016-08-28 LAB — PROTIME-INR
INR: 2.99
Prothrombin Time: 31.7 seconds — ABNORMAL HIGH (ref 11.4–15.2)

## 2016-08-28 LAB — CBC WITH DIFFERENTIAL/PLATELET
Basophils Absolute: 0 10*3/uL (ref 0.0–0.1)
Basophils Relative: 1 %
Eosinophils Absolute: 0.2 10*3/uL (ref 0.0–0.7)
Eosinophils Relative: 5 %
HCT: 36.9 % (ref 36.0–46.0)
Hemoglobin: 12.5 g/dL (ref 12.0–15.0)
Lymphocytes Relative: 35 %
Lymphs Abs: 1.5 10*3/uL (ref 0.7–4.0)
MCH: 33.6 pg (ref 26.0–34.0)
MCHC: 33.9 g/dL (ref 30.0–36.0)
MCV: 99.2 fL (ref 78.0–100.0)
Monocytes Absolute: 0.5 10*3/uL (ref 0.1–1.0)
Monocytes Relative: 12 %
Neutro Abs: 2 10*3/uL (ref 1.7–7.7)
Neutrophils Relative %: 47 %
Platelets: 148 10*3/uL — ABNORMAL LOW (ref 150–400)
RBC: 3.72 MIL/uL — ABNORMAL LOW (ref 3.87–5.11)
RDW: 12.9 % (ref 11.5–15.5)
WBC: 4.2 10*3/uL (ref 4.0–10.5)

## 2016-08-28 MED ORDER — ACETAMINOPHEN 325 MG PO TABS
650.0000 mg | ORAL_TABLET | Freq: Once | ORAL | Status: AC
  Administered 2016-08-28: 650 mg via ORAL
  Filled 2016-08-28: qty 2

## 2016-08-28 NOTE — Discharge Instructions (Signed)
Read the information below.   Your labs and imaging were re-assuring.  Be sure to follow up with your primary care doctor next week for re-evaluation.  You may return to the Emergency Department at any time for worsening condition or any new symptoms that concern you. Return to ED if you develop fever, vomiting, confusion/lethargy, numbness/weakness, changes in vision, facial droop, or slurred speech.

## 2016-08-28 NOTE — ED Notes (Signed)
Given to Dr. Leonette Monarch

## 2016-08-28 NOTE — ED Provider Notes (Signed)
Medical screening examination/treatment/procedure(s) were conducted as a shared visit with non-physician practitioner(s) and myself.  I personally evaluated the patient during the encounter. Briefly, the patient is a 75 y.o. female who presents after a mechanical fall. Patient is currently on Coumadin for aortic valve replacement and A. Fib. Last INR was 2.6 last week. No physical complaints. No tenderness to palpation on exam. Neurologically intact. CT head and cervical spine w/o acute injury. Screening labs with reassuring with therapeutic INR. Safe for discharge.   EKG Interpretation  Date/Time:  Saturday August 28 2016 19:14:33 EDT Ventricular Rate:  42 PR Interval:    QRS Duration: 123 QT Interval:  488 QTC Calculation: 408 R Axis:   57 Text Interpretation:  Sinus bradycardia Nonspecific intraventricular conduction delay Confirmed by [facility] MD, Denicia Pagliarulo ([id number]) on 08/28/2016 7:40:48 PM           Fatima Blank, MD 08/28/16 2358

## 2016-08-28 NOTE — ED Triage Notes (Signed)
The pt presents with c/o fall in kitchen today. She reports she lost her balance and fell backwards hitting her head on the floor. She denies any vision changes, nausea, dizziness, LOC. She reports twitching in L eye and head pain since the fall.She reports frequent falls related to her parkinsonism.  She is taking coumadin. She is alert, breathing easily, MAE

## 2016-08-29 NOTE — ED Provider Notes (Signed)
[facility] DEPT Provider Note   CSN: [id number] Arrival date & time: 08/28/16  1738     History   Chief Complaint Chief Complaint  Patient presents with  . Fall    HPI [name] is a 75 y.o. female.  [name] is a 75 y.o. female with h/o aortic valve stenosis and replacement on coumadin, paroxysmal atrial fibrillation, HTN, HCHOL, breast cancer, disequilibrium most likely secondary to progressive supranuclear palsy presents to ED following a fall. Patient reports she fell backward in kitchen today hitting the back of her head on the floor. She denies LOC. No prodromal symptoms prior to fall. She is on Coumadin for mechanical valve and told if she ever falls and hits her head to come to the ED for evaluation. Patient endorses diffuse, throbbing headache, mild nausea, and mild neck pain. She denies fever, trouble swallowing, changes in vision, shortness of breath, chest pain, abdominal pain, vomiting, dysuria, wounds, rashes, skin color change, dizziness, lightheadedness, numbness, weakness, facial droop, slurred speech, or seizures. She has not taken anything for her sxs PTA. She reports she was recently diagnosed with an atypical parkinsonism called progressive supranuclear palsy and that she is prone to being "clumsy" and falling. She is followed by neurology and currently on levodopa and carbidopa.       Past Medical History:  Diagnosis Date  . A-fib (HCC)    paroxysmal  . Aortic stenosis, severe   . Breast CA ([place]) 2004  . Chest pain   . GERD (gastroesophageal reflux disease)   . Hemifacial spasm   . Herpes zoster infection    History of herpes zoster infection  . History of bronchitis   . History of hiatal hernia   . History of kidney stones   . HTN (hypertension)   . Hypercholesteremia   . Hypercholesterolemia   . Irritable bowel syndrome   . Mucositis    Severe mucositis  . Obesity   . PONV (postoperative nausea and vomiting)   . PVC's (premature  ventricular contractions)   . Venous varices      Patient Active Problem List   Diagnosis Date Noted  . Family history of colon cancer 07/30/2014  . Hiatal hernia 07/30/2014  . Chest pain 10/13/2011  . Disequilibrium 10/13/2011  . Heart valve replaced by other means 04/16/2011  . Anticoagulant long-term use 03/17/2011  . S/P AVR (aortic valve replacement)   . PVC's (premature ventricular contractions)   . HTN (hypertension)   . Hypercholesteremia   . A-fib ([place])   . GERD (gastroesophageal reflux disease)   . GERD 02/06/2010  . DIVERTICULOSIS-COLON 02/06/2010  . PERSONAL HX COLONIC POLYPS 02/06/2010  . MASTECTOMY, HX OF 02/06/2010    Past Surgical History:  Procedure Laterality Date  . AORTIC VALVE REPLACEMENT  04/13/06   [employer] mechanical conduit  . APPENDECTOMY    . cataract surgery     bilateral  . COLONOSCOPY N/A 10/03/2014   Procedure: COLONOSCOPY;  Surgeon: [name], MD;  Location: [facility];  Service: Endoscopy;  Laterality: N/A;  . CYSTOCELE REPAIR    . ESOPHAGEAL MANOMETRY N/A 08/12/2014   Procedure: ESOPHAGEAL MANOMETRY (EM);  Surgeon: [name], MD;  Location: [facility] ENDOSCOPY;  Service: Endoscopy;  Laterality: N/A;  . ESOPHAGOGASTRODUODENOSCOPY N/A 10/03/2014   Procedure: ESOPHAGOGASTRODUODENOSCOPY (EGD);  Surgeon: [name], MD;  Location: [facility] ENDOSCOPY;  Service: Endoscopy;  Laterality: N/A;  . FOOT SURGERY    . KIDNEY STONE SURGERY    . RECTOCELE  REPAIR    . TOTAL ABDOMINAL HYSTERECTOMY      OB History    No data available       Home Medications    Prior to Admission medications   Medication Sig Start Date End Date Taking? Authorizing Provider  Ascorbic Acid (VITAMIN C) 1000 MG tablet Take 1,000 mg by mouth 3 (three) times daily.     Yes Historical Provider, MD  calcium citrate-vitamin D (CITRACAL+D) 315-200 MG-UNIT per tablet Take 1 tablet by mouth 3 (three) times daily.   Yes Historical Provider, MD  carbidopa-levodopa (SINEMET IR)  25-100 MG tablet Take 2 tablets by mouth 3 (three) times daily. 07/06/16  Yes Rebecca S Tat, DO  Cholecalciferol (VITAMIN D3) 1000 UNITS tablet Take 2,000 Units by mouth daily.    Yes Historical Provider, MD  famotidine-calcium carbonate-magnesium hydroxide (PEPCID COMPLETE) 10-800-165 MG CHEW chewable tablet Chew 1 tablet by mouth daily as needed (heartburn).   Yes Historical Provider, MD  metoprolol succinate (TOPROL XL) 25 MG 24 hr tablet Take 1 tablet (25 mg total) by mouth daily. 08/01/15  Yes Peter M Martinique, MD  Multiple Vitamin (MULTIVITAMIN) tablet Take 1 tablet by mouth daily at 12 noon.    Yes Historical Provider, MD  Multiple Vitamins-Minerals (VISION-VITE PRESERVE PO) Take 1 tablet by mouth 2 (two) times daily.   Yes Historical Provider, MD  oxybutynin (DITROPAN) 5 MG tablet Take 2.5 mg by mouth 2 (two) times daily.    Yes Historical Provider, MD  pantoprazole (PROTONIX) 40 MG tablet Take 40 mg by mouth daily.    Yes Historical Provider, MD  simvastatin (ZOCOR) 20 MG tablet Take 20 mg by mouth every evening.   Yes Historical Provider, MD  triamterene-hydrochlorothiazide (MAXZIDE-25) 37.5-25 MG per tablet Take 1 tablet by mouth daily.   Yes Historical Provider, MD  UNABLE TO FIND Take 900 mg by mouth daily at 12 noon. Omega Red    Yes Historical Provider, MD  vitamin B-12 (CYANOCOBALAMIN) 1000 MCG tablet Take 1,000 mcg by mouth every morning.   Yes Historical Provider, MD  warfarin (COUMADIN) 5 MG tablet Take 5 mg by mouth every evening. 7.5 mg every Tues, Thurs, Sat; 5 mg all other days   Yes Historical Provider, MD    Family History Family History  Problem Relation Age of Onset  . Heart disease Mother   . Heart disease Father 76    CABG  . Other Brother     AVR    Social History Social History  Substance Use Topics  . Smoking status: Never Smoker  . Smokeless tobacco: Never Used  . Alcohol use No     Allergies   Meperidine hcl; Oxycodone; Adhesive [tape]; and Ivp dye  [iodinated diagnostic agents]   Review of Systems Review of Systems  Constitutional: Negative for chills, diaphoresis and fever.  HENT: Negative for trouble swallowing.   Eyes: Negative for visual disturbance.  Respiratory: Negative for shortness of breath.   Cardiovascular: Negative for chest pain.  Gastrointestinal: Positive for nausea. Negative for abdominal pain, blood in stool, constipation, diarrhea and vomiting.  Genitourinary: Negative for dysuria.  Musculoskeletal: Positive for neck pain.  Skin: Negative for color change, rash and wound.  Neurological: Positive for headaches. Negative for dizziness, seizures, syncope, facial asymmetry, speech difficulty, weakness, light-headedness and numbness.     Physical Exam Updated Vital Signs BP (!) 142/54   Pulse (!) 47   Temp 98.5 F (36.9 C) (Oral)   Resp 24   SpO2 100%  Physical Exam  Constitutional: She appears well-developed and well-nourished. No distress.  HENT:  Head: Normocephalic and atraumatic. Head is without raccoon's eyes and without Battle's sign.  Mouth/Throat: Uvula is midline and oropharynx is clear and moist. No trismus in the jaw. No oropharyngeal exudate.  Eyes: Conjunctivae and EOM are normal. Pupils are equal, round, and reactive to light. Right eye exhibits no discharge. Left eye exhibits no discharge. No scleral icterus.  Neck: Normal range of motion and phonation normal. Neck supple. No spinous process tenderness and no muscular tenderness present. No neck rigidity. Normal range of motion present.  Cardiovascular: Normal rate, regular rhythm and intact distal pulses.   Murmur heard.  Systolic murmur is present  Pulmonary/Chest: Effort normal and breath sounds normal. No stridor. No respiratory distress.  Abdominal: Soft. Bowel sounds are normal. There is no tenderness. There is no rigidity, no rebound and no guarding.  Musculoskeletal: Normal range of motion.  No obvious deformity of spine. No TTP of  C-, T-, L- spine. No TTP of paravertebral muscles. No step off. Mild TTP of left shoulder; however, patient states this is chronic. No TTP of pelvis. No pain with pelvic rotation. No other joint or muscle pain.   Lymphadenopathy:    She has no cervical adenopathy.  Neurological: She is alert. She is not disoriented. Coordination normal. GCS eye subscore is 4. GCS verbal subscore is 5. GCS motor subscore is 6.  Mental Status:  Alert, thought content appropriate, able to give a coherent history. Speech fluent without evidence of aphasia. Able to follow 2 step commands without difficulty.  Cranial Nerves:  II:  Peripheral visual fields grossly normal, pupils equal, round, reactive to light III,IV, VI: ptosis not present, extra-ocular motions intact bilaterally  V,VII: smile symmetric, facial light touch sensation equal VIII: hearing grossly normal to voice  X: uvula elevates symmetrically  XI: bilateral shoulder shrug symmetric and strong XII: midline tongue extension without fassiculations Motor:  Normal tone. 5/5 in upper and lower extremities bilaterally including strong and equal grip strength and dorsiflexion/plantar flexion Sensory: light touch normal in all extremities. Cerebellar: normal finger-to-nose with bilateral upper extremities Gait: normal gait and balance CV: distal pulses palpable throughout   Skin: Skin is warm and dry. No abrasion, no bruising, no ecchymosis, no laceration, no petechiae and no rash noted. She is not diaphoretic.  No bruising, ecchymosis, or masses appreciated on inspection and palpation of scalp.   Psychiatric: She has a normal mood and affect. Her behavior is normal.     ED Treatments / Results  Labs (all labs ordered are listed, but only abnormal results are displayed) Labs Reviewed  CBC WITH DIFFERENTIAL/PLATELET - Abnormal; Notable for the following:       Result Value   RBC 3.72 (*)    Platelets 148 (*)    All other components within normal  limits  BASIC METABOLIC PANEL - Abnormal; Notable for the following:    Glucose, Bld 107 (*)    Creatinine, Ser 1.21 (*)    GFR calc non Af Amer 42 (*)    GFR calc Af Amer 49 (*)    All other components within normal limits  PROTIME-INR - Abnormal; Notable for the following:    Prothrombin Time 31.7 (*)    All other components within normal limits    EKG  EKG Interpretation  Date/Time:  Saturday August 28 2016 19:14:33 EDT Ventricular Rate:  42 PR Interval:    QRS Duration: 123 QT Interval:  488  QTC Calculation: 408 R Axis:   57 Text Interpretation:  Sinus bradycardia Nonspecific intraventricular conduction delay Confirmed by [facility] MD, PEDRO ([id number]) on 08/28/2016 7:40:48 PM       Radiology Ct Head Wo Contrast  Result Date: 08/28/2016 CLINICAL DATA:  Fall on Coumadin. Headache and neck pain. No loss of consciousness. History of frequent falls due to Parkinson's disease. EXAM: CT HEAD WITHOUT CONTRAST CT CERVICAL SPINE WITHOUT CONTRAST TECHNIQUE: Multidetector CT imaging of the head and cervical spine was performed following the standard protocol without intravenous contrast. Multiplanar CT image reconstructions of the cervical spine were also generated. COMPARISON:  MRI cervical spine 06/03/2016. Cervical spine radiographs 05/17/2016. FINDINGS: CT HEAD FINDINGS Diffuse cerebral atrophy. Ventricular dilatation consistent with central atrophy. Low-attenuation changes in the deep white matter consistent small vessel ischemia. No mass effect or midline shift. No abnormal extra-axial fluid collections. Gray-white matter junctions are distinct. Basal cisterns are not effaced. No evidence of acute intracranial hemorrhage. No depressed skull fractures. Visualized paranasal sinuses and mastoid air cells are not opacified. Vascular calcifications. CT CERVICAL SPINE FINDINGS There is reversal of the usual cervical lordosis. This may be due to patient positioning but ligamentous injury or muscle  spasm can also have this appearance and are not excluded. Degenerative changes in the lower cervical region with narrowed interspaces and endplate hypertrophic changes. No vertebral compression deformities. No prevertebral soft tissue swelling. Normal alignment of the facet joints. No focal bone lesions or bone destruction. C1-2 articulation appears intact. Congenital nonunion of the posterior arch of C1. Vascular calcifications in the cervical carotid arteries. IMPRESSION: No acute intracranial abnormalities. Chronic atrophy and small vessel ischemic changes. Nonspecific reversal of the usual cervical lordosis. Mild degenerative changes in the lower cervical spine. No acute displaced fractures are identified. Electronically Signed   By: Lucienne Capers M.D.   On: 08/28/2016 20:06   Ct Cervical Spine Wo Contrast  Result Date: 08/28/2016 CLINICAL DATA:  Fall on Coumadin. Headache and neck pain. No loss of consciousness. History of frequent falls due to Parkinson's disease. EXAM: CT HEAD WITHOUT CONTRAST CT CERVICAL SPINE WITHOUT CONTRAST TECHNIQUE: Multidetector CT imaging of the head and cervical spine was performed following the standard protocol without intravenous contrast. Multiplanar CT image reconstructions of the cervical spine were also generated. COMPARISON:  MRI cervical spine 06/03/2016. Cervical spine radiographs 05/17/2016. FINDINGS: CT HEAD FINDINGS Diffuse cerebral atrophy. Ventricular dilatation consistent with central atrophy. Low-attenuation changes in the deep white matter consistent small vessel ischemia. No mass effect or midline shift. No abnormal extra-axial fluid collections. Gray-white matter junctions are distinct. Basal cisterns are not effaced. No evidence of acute intracranial hemorrhage. No depressed skull fractures. Visualized paranasal sinuses and mastoid air cells are not opacified. Vascular calcifications. CT CERVICAL SPINE FINDINGS There is reversal of the usual cervical  lordosis. This may be due to patient positioning but ligamentous injury or muscle spasm can also have this appearance and are not excluded. Degenerative changes in the lower cervical region with narrowed interspaces and endplate hypertrophic changes. No vertebral compression deformities. No prevertebral soft tissue swelling. Normal alignment of the facet joints. No focal bone lesions or bone destruction. C1-2 articulation appears intact. Congenital nonunion of the posterior arch of C1. Vascular calcifications in the cervical carotid arteries. IMPRESSION: No acute intracranial abnormalities. Chronic atrophy and small vessel ischemic changes. Nonspecific reversal of the usual cervical lordosis. Mild degenerative changes in the lower cervical spine. No acute displaced fractures are identified. Electronically Signed   By: Oren Beckmann.D.  On: 08/28/2016 20:06   Dg Shoulder Left  Result Date: 08/28/2016 CLINICAL DATA:  Fall in kitchen today. Left shoulder injury and pain. Initial encounter. EXAM: LEFT SHOULDER - 2+ VIEW COMPARISON:  None. FINDINGS: There is no evidence of fracture or dislocation. There is no evidence of arthropathy or other focal bone abnormality. Soft tissues are unremarkable. IMPRESSION: Negative. Electronically Signed   By: Earle Gell M.D.   On: 08/28/2016 19:52    Procedures Procedures (including critical care time)  Medications Ordered in ED Medications  acetaminophen (TYLENOL) tablet 650 mg (650 mg Oral Given 08/28/16 2018)   Vitals:   08/28/16 1800 08/28/16 2016 08/28/16 2101 08/28/16 2126  BP: 122/57 (!) 145/48 (!) 142/54   Pulse: (!) 41 (!) 43 (!) 47   Resp: 14 17 24    Temp:    98.5 F (36.9 C)  TempSrc:    Oral  SpO2: 98% 99% 100%      Initial Impression / Assessment and Plan / ED Course  I have reviewed the triage vital signs and the nursing notes.  Pertinent labs & imaging results that were available during my care of the patient were reviewed by me and  considered in my medical decision making (see chart for details).  Clinical Course  Value Comment By Time  CT Head Wo Contrast Reviewed Roxanna Mew, PA-C 09/02 2030  CT Cervical Spine Wo Contrast Reviewed Roxanna Mew, PA-C 09/02 2030  DG Shoulder Left No obvious fracture or dislocation.  Roxanna Mew, [place] 09/02 2030   Patient endorses improvement and ready to go home.  Roxanna Mew, [place] 09/02 2100    Patient presents to ED after fall. Patient is afebrile and non-toxic appearing in NAD. Vital signs remarkable for bradycardia, patient states she typically runs in mid 40s, otherwise stable. Physical exam is re-assuring - mild TTP of left shoulder, patient endorses this is chronic in nature. No TTP of pelvic girdle. No focal neurologic deficits. No ecchymosis, bruising, hematoma, laceration, or masses palpated on scalp. No evidence of basilar skull fracture - no battle sign or raccoon eyes. Given patient is on Coumdin will CT head to r/o intracranial bleed. Will CT neck given age and patient complaint of neck pain. Will check CBC and BMP to r/o electrolyte abnormalities or anemia as cause of fall. INR to ensure therapeutic. EKG given bradycardia. Pain medication given to patient.   CT head and neck shows no acute intracranial abnormalities, no evidence of hemorrhage or skull fracture. X-ray left shoulder shows no fracture or dislocation. INR therapeutic. EKG shows sinus bradycardia and non-specific intraventricular conduction delay. CBC re-assuring. BMP shows mild elevation of creatinine; however, stable compared to previous. Suspect fall is secondary to her progressive supranuclear palsy (atypical parkinsonism).   On re-evaluation patient endorses improvement and ready to go home. Patient is ambulatory. Discussed results with patient. Symptomatic management discussed. Encouraged follow up with PCP for re-evaluation next week. Strict return precautions given. Patient voices  understanding and is agreeable.     Final Clinical Impressions(s) / ED Diagnoses   Final diagnoses:  Fall, initial encounter    New Prescriptions Discharge Medication List as of 08/28/2016  9:27 PM       Roxanna Mew, PA-C 08/29/16 1001

## 2016-09-02 DIAGNOSIS — N3281 Overactive bladder: Secondary | ICD-10-CM | POA: Diagnosis not present

## 2016-09-02 DIAGNOSIS — Z01419 Encounter for gynecological examination (general) (routine) without abnormal findings: Secondary | ICD-10-CM | POA: Diagnosis not present

## 2016-09-02 DIAGNOSIS — Z6828 Body mass index (BMI) 28.0-28.9, adult: Secondary | ICD-10-CM | POA: Diagnosis not present

## 2016-09-02 DIAGNOSIS — N201 Calculus of ureter: Secondary | ICD-10-CM | POA: Diagnosis not present

## 2016-09-03 ENCOUNTER — Telehealth: Payer: Self-pay | Admitting: Neurology

## 2016-09-03 NOTE — Telephone Encounter (Signed)
Spoke with patient's husband. Made him aware to have patient take medication the am of surgery if able and to stay away from phenergan and use zofran for nausea if needed. They will call with any problems.

## 2016-09-03 NOTE — Telephone Encounter (Signed)
[name] 11/04/2041. Her husband called in regarding his wife who is having to have her kidney stone removed. Dr. Carles Collet had mentioned in the past that she should be concerned when being put to sleep. They would like to speak with you regarding this. Her # is [phone]. Thank you

## 2016-09-09 ENCOUNTER — Other Ambulatory Visit: Payer: Self-pay | Admitting: Urology

## 2016-09-09 DIAGNOSIS — Z23 Encounter for immunization: Secondary | ICD-10-CM | POA: Diagnosis not present

## 2016-09-09 DIAGNOSIS — N200 Calculus of kidney: Secondary | ICD-10-CM | POA: Diagnosis not present

## 2016-09-09 DIAGNOSIS — Z952 Presence of prosthetic heart valve: Secondary | ICD-10-CM | POA: Diagnosis not present

## 2016-09-09 DIAGNOSIS — Z7901 Long term (current) use of anticoagulants: Secondary | ICD-10-CM | POA: Diagnosis not present

## 2016-09-13 DIAGNOSIS — N201 Calculus of ureter: Secondary | ICD-10-CM | POA: Diagnosis not present

## 2016-09-14 ENCOUNTER — Encounter (HOSPITAL_BASED_OUTPATIENT_CLINIC_OR_DEPARTMENT_OTHER): Payer: Self-pay | Admitting: *Deleted

## 2016-09-14 NOTE — Progress Notes (Signed)
SPOKE W/  PT HUSBAND.  NPO AFTER MN.  ARRIVE AT 0600.  CURRENT LAB RESULTS AND EKG IN CHART AND EPIC.  NO PT/INR NEEDED PT IS BRIDGING LOVENOX INJECTION.  WILL TAKE AM MEDS DOS W/ SIPS OF WATER WITH EXCEPTION NO MAXIDE/ VITAMINS.

## 2016-09-15 NOTE — Progress Notes (Signed)
Cardiology Office Note    Date:  09/16/2016   ID:  [name], Jessop 13-Aug-1941, MRN [id number]  PCP:  Donnajean Lopes, MD  Cardiologist:  Duell Holdren [place], MD    History of Present Illness:  [name] is a 75 y.o. female with a history of aortic valve disease and is status post aortic valve replacement with mechanical St Jude's prosthesis in 2007. She is on chronic anticoagulation. She has a history of paroxysmal atrial fibrillation. 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 She has a history of hypertension. She also is followed by Neurology for gait instability and recurrent falls. MRI showed chronic microvascular changes. She is felt to have an atypical parkinson's syndrome.  She was seen in the ED on 08/28/16 following a fall where she hit her head. CT of the head and neck was negative. HR was 42-47 during ED visit. Denies dizziness, chest pain, SOB. Is scheduled for surgery tomorrow for kidney stones with obstructive. Currently being bridged with Lovenox.    Past Medical History:  Diagnosis Date  . Anticoagulated on Coumadin   . Arthropathy of cervical facet joint (HCC)    C2-3  . At risk for falls   . BPV (benign positional vertigo)   . Bruises easily   . CHF (congestive heart failure) ([place])    per last echo 10-24-2014 ef 40-45%  . Chronic neck pain   . CKD (chronic kidney disease), stage II   . Diverticulosis of colon   . GERD (gastroesophageal reflux disease)   . Hiatal hernia   . History of aortic valve stenosis   . History of breast cancer per pt no recurrence   dx 2004-- Left breast DCIS (ER/PR+, HER2 negative) s/p  partial masectomy w/ sln bx and  chemoradiotion (completed 2004)  . History of colon polyps   . History of herpes zoster   . History of kidney stones   . History of kidney stones   . HTN (hypertension)   . Hyperlipidemia   . Irritable bowel syndrome   . LBBB  (left bundle branch block)   . PAF (paroxysmal atrial fibrillation) [facility])    cardiologist-  dr Martinique  . Parkinsonism [facility])    neurologist-  dr tat  . PONV (postoperative nausea and vomiting)   . PVC's (premature ventricular contractions)   . Right ureteral stone   . S/P aortic valve replacement with prosthetic valve    04-13-2006  St Jude mechnical  for severe stenosis  . SUI (stress urinary incontinence, female)   . Unstable balance    uses cane  . Venous varices      Past Surgical History:  Procedure Laterality Date  . ANTERIOR AND POSTERIOR REPAIR  1990   cystocele/ rectocele  . AORTIC VALVE REPLACEMENT  04/13/06   dr gerhardt   and Replacement Ascending Aorta (St. Jude mechanical prosthesis)  . APPENDECTOMY  child 1950  . CARDIAC CATHETERIZATION  02-22-2001   dr Martinique   minimal non-obstructive cad/  mild aortic stenosis and aortic root enlargement/  normal LVF, ef 65%  . CARDIAC CATHETERIZATION  03-22-2006    dr Martinique   no significant obstructive cad/  severe aortic stenosis and mild to moderate aortic root enlargement/  upper normal right heart pressures  . CARDIOVASCULAR STRESS TEST  09-30-2011   dr Martinique   lexiscan nuclear study w/ apical thinning  vs  small prior  apical infarct w/ no significant ischemia/  hypokinesis of the distal septum and apex, ef 50%  . CARPAL TUNNEL RELEASE Bilateral left 09-09-2004/  right 2007  . CATARACT EXTRACTION W/ INTRAOCULAR LENS  IMPLANT, BILATERAL  2015  . COLONOSCOPY N/A 10/03/2014   Procedure: COLONOSCOPY;  Surgeon: [name], MD;  Location: [place];  Service: Endoscopy;  Laterality: N/A;  . ESOPHAGEAL MANOMETRY N/A 08/12/2014   Procedure: ESOPHAGEAL MANOMETRY (EM);  Surgeon: [name], MD;  Location: [facility];  Service: Endoscopy;  Laterality: N/A;  . ESOPHAGOGASTRODUODENOSCOPY N/A 10/03/2014   Procedure: ESOPHAGOGASTRODUODENOSCOPY (EGD);  Surgeon: [name], MD;  Location: [facility] ENDOSCOPY;  Service: Endoscopy;   Laterality: N/A;  . [place]  . FOOT SURGERY Right 2013   hammertoe x2  . PERCUTANEOUS NEPHROSTOLITHOTOMY  1993  . TOTAL ABDOMINAL HYSTERECTOMY  1971   w/ Bilateral Salpingoophorectomy  . TRANSTHORACIC ECHOCARDIOGRAM  10/24/2014   dr Martinique   hypokinesis of the basal and mid inferoseptal and inferior walls,  ef 40-45%,  grade 1 diastolic dysfunction/  mechanical bileaflet aortic valve noted w/ mild regur., peak grandient 52mHg, valve area 1.35cm^2/  severe MV calcification without stenosis w/ mild regurg., peak gradient 373mg/  mild LAE/  poss. atrial mass (MRI showed benign lipomatous hypertrophy of atrial septum) /tFrazier ButtR/mild TR    Current Medications: Outpatient Medications Prior to Visit  Medication Sig Dispense Refill  . Ascorbic Acid (VITAMIN C) 1000 MG tablet Take 1,000 mg by mouth 3 (three) times daily.      . calcium citrate-vitamin D (CITRACAL+D) 315-200 MG-UNIT per tablet Take 1 tablet by mouth 3 (three) times daily.    . carbidopa-levodopa (SINEMET IR) 25-100 MG tablet Take 2 tablets by mouth 3 (three) times daily. 540 tablet 1  . Cholecalciferol (VITAMIN D3) 2000 units TABS Take 1 capsule by mouth daily.    . Enoxaparin Sodium (LOVENOX IJ) Inject as directed 2 (two) times daily. As directed by pcp dr daStarla Link  . famotidine-calcium carbonate-magnesium hydroxide (PEPCID COMPLETE) 10-800-165 MG CHEW chewable tablet Chew 1 tablet by mouth daily as needed (heartburn).    . Multiple Vitamin (MULTIVITAMIN) tablet Take 1 tablet by mouth daily at 12 noon.     . Multiple Vitamins-Minerals (VISION-VITE PRESERVE PO) Take 1 tablet by mouth 2 (two) times daily.    . Marland Kitchenxybutynin (DITROPAN) 5 MG tablet Take 2.5 mg by mouth 2 (two) times daily.     . pantoprazole (PROTONIX) 40 MG tablet Take 40 mg by mouth every morning.     . simvastatin (ZOCOR) 20 MG tablet Take 20 mg by mouth every evening.    . triamterene-hydrochlorothiazide (MAXZIDE-25)  37.5-25 MG per tablet Take 1 tablet by mouth every morning.     . Marland KitchenNABLE TO FIND Take 900 mg by mouth daily at 12 noon. Omega Red     . vitamin B-12 (CYANOCOBALAMIN) 1000 MCG tablet Take 1,000 mcg by mouth every morning.    . warfarin (COUMADIN) 5 MG tablet Take 5 mg by mouth every evening. 7.5 mg every Tues, Thurs, Sat; 5 mg all other days    . metoprolol succinate (TOPROL XL) 25 MG 24 hr tablet Take 1 tablet (25 mg total) by mouth daily. (Patient taking differently: Take 25 mg by mouth every morning. ) 90 tablet 3   No facility-administered medications prior to visit.      Allergies:   Demerol [meperidine]; Oxycodone; Adhesive [tape]; Ivp dye [iodinated diagnostic agents]; and  Phenergan [promethazine]   Social History   Social History  . Marital status: Married    Spouse name: N/A  . Number of children: 3  . Years of education: 10   Occupational History  . bakery/caterer Other    retired   Social History Main Topics  . Smoking status: Never Smoker  . Smokeless tobacco: Never Used  . Alcohol use No  . Drug use: No  . Sexual activity: Not Asked   Other Topics Concern  . None   Social History Narrative      1/2 -1 soda a day       Family History:  The patient's family history includes Heart disease in her mother; Heart disease (age of onset: 80) in her father; Other in her brother.   ROS:   Please see the history of present illness.    ROS All other systems reviewed and are negative.   PHYSICAL EXAM:   VS:  BP 131/76   Pulse (!) 51   Ht 4' 11.75" (1.518 m)   Wt 138 lb 9.6 oz (62.9 kg)   SpO2 98%   BMI 27.30 kg/m    GEN: Well nourished, well developed, in no acute distress  HEENT: normal  Neck: no JVD, carotid bruits, or masses Cardiac: RRR; no murmurs, rubs, or gallops,no edema  Respiratory:  clear to auscultation bilaterally, normal work of breathing GI: soft, nontender, nondistended, + BS MS: no deformity or atrophy  Skin: warm and dry, no rash Neuro:   Alert and Oriented x 3, Strength and sensation are intact Psych: euthymic mood, full affect  Wt Readings from Last 3 Encounters:  09/16/16 138 lb 9.6 oz (62.9 kg)  07/06/16 141 lb (64 kg)  06/22/16 144 lb (65.3 kg)      Studies/Labs Reviewed:   EKG:  EKG is not ordered today.  The ekg ordered 08/28/16 demonstrates marked sinus brady with rate 42. IVCD. I have personally reviewed and interpreted this study.   Recent Labs: 08/28/2016: BUN 17; Creatinine, Ser 1.21; Hemoglobin 12.5; Platelets 148; Potassium 3.7; Sodium 135   Lipid Panel    Component Value Date/Time   CHOL 145 09/30/2011 0415   TRIG 42 09/30/2011 0415   HDL 77 09/30/2011 0415   CHOLHDL 1.9 09/30/2011 0415   VLDL 8 09/30/2011 0415   LDLCALC 60 09/30/2011 0415    Additional studies/ records that were reviewed today include:  Labs from primary care dated 05/10/16:Cholesterol 171, triglycerides 81, LDL 97, HDL 58. BUN 17. Creatinine 1.21. Other chemistries OK.   ASSESSMENT:    1. S/p mechanical AVR. Coumadin on hold for planned surgery with bridging Lovenox. Routine SBE prophylaxis.  2. HTN controlled. 3. IVCD/LBBB- chronic. 4. Paroxysmal Afib- no symptoms. 5. Marked sinus bradycardia.  PLAN:  1. Will discontinue metoprolol 2. She is cleared for urologic surgery  I will follow up in 6 months    Medication Adjustments/Labs and Tests Ordered: Current medicines are reviewed at length with the patient today.  Concerns regarding medicines are outlined above.  Medication changes, Labs and Tests ordered today are listed in the Patient Instructions below. Patient Instructions  Stop taking metoprolol (Toprol XL)  Continue your other therapy  Good luck with your surgery tomorrow.  I will see you in 6 months.    Signed, Marty Uy Martin[place], MD  09/16/2016 12:29 PM    [facility] [address] [place], [place], [address] [phone]

## 2016-09-16 ENCOUNTER — Encounter: Payer: Self-pay | Admitting: Cardiology

## 2016-09-16 ENCOUNTER — Ambulatory Visit (INDEPENDENT_AMBULATORY_CARE_PROVIDER_SITE_OTHER): Payer: PPO | Admitting: Cardiology

## 2016-09-16 ENCOUNTER — Ambulatory Visit: Payer: Self-pay | Admitting: Urology

## 2016-09-16 VITALS — BP 131/76 | HR 51 | Ht 59.75 in | Wt 138.6 lb

## 2016-09-16 DIAGNOSIS — Z954 Presence of other heart-valve replacement: Secondary | ICD-10-CM

## 2016-09-16 DIAGNOSIS — I10 Essential (primary) hypertension: Secondary | ICD-10-CM | POA: Diagnosis not present

## 2016-09-16 DIAGNOSIS — Z952 Presence of prosthetic heart valve: Secondary | ICD-10-CM

## 2016-09-16 DIAGNOSIS — I493 Ventricular premature depolarization: Secondary | ICD-10-CM

## 2016-09-16 DIAGNOSIS — I480 Paroxysmal atrial fibrillation: Secondary | ICD-10-CM

## 2016-09-16 DIAGNOSIS — R001 Bradycardia, unspecified: Secondary | ICD-10-CM

## 2016-09-16 DIAGNOSIS — E7800 Pure hypercholesterolemia, unspecified: Secondary | ICD-10-CM

## 2016-09-16 NOTE — Patient Instructions (Addendum)
Stop taking metoprolol (Toprol XL)  Continue your other therapy  Good luck with your surgery tomorrow.  I will see you in 6 months.

## 2016-09-16 NOTE — Anesthesia Preprocedure Evaluation (Signed)
Anesthesia Evaluation  Patient identified by MRN, date of birth, ID band Patient awake    Reviewed: Allergy & Precautions, NPO status , Patient's Chart, lab work & pertinent test results  History of Anesthesia Complications (+) PONV and history of anesthetic complications  Airway Mallampati: II  TM Distance: >3 FB Neck ROM: Full    Dental no notable dental hx.    Pulmonary neg pulmonary ROS,    Pulmonary exam normal breath sounds clear to auscultation       Cardiovascular hypertension, +CHF  Normal cardiovascular exam+ dysrhythmias + Valvular Problems/Murmurs AS  Rhythm:Regular Rate:Normal  Echo 2015 - Left ventricle: Systolic function was mildly to moderately reduced. The estimated ejection fraction was in the range of 40%to 45%. Doppler parameters are consistent with abnormal leftventricular relaxation (grade 1 diastolic dysfunction). Doppler parameters are consistent with elevated ventricular end-diastolicfilling pressure. - Aortic valve: There was mild regurgitation. Mean gradient (S): 68mm Hg. Peak gradient (S): 25 mm Hg. Valve area (VTI): 1.35 cm^2.Valve area (Vmax): 1.33 cm^2. Valve area (Vmean): 1.39 cm^2. - Mitral valve: Severely calcified annulus, predominantly posteriorly. There was mild regurgitation. - Left atrium: The atrium was mildly dilated. - Right ventricle: Systolic function was normal. - Atrial septum: There is mass on the right atrial side of theinteratrial septum measuring 15 x 15 mm. - Tricuspid valve: There was mild regurgitation. - Pulmonic valve: There was trivial regurgitation. - Inferior vena cava: The vessel was normal in size. The respirophasic diameter changes were in the normal range (= 50%),consistent with normal central venous pressure.   Neuro/Psych negative neurological ROS  negative psych ROS   GI/Hepatic Neg liver ROS, hiatal hernia, GERD  ,  Endo/Other  negative endocrine  ROS  Renal/GU Renal disease  negative genitourinary   Musculoskeletal negative musculoskeletal ROS (+)   Abdominal   Peds  Hematology negative hematology ROS (+)   Anesthesia Other Findings   Reproductive/Obstetrics negative OB ROS                            Anesthesia Physical Anesthesia Plan  ASA: III  Anesthesia Plan: General   Post-op Pain Management:    Induction: Intravenous  Airway Management Planned: LMA  Additional Equipment:   Intra-op Plan:   Post-operative Plan: Extubation in OR  Informed Consent: I have reviewed the patients History and Physical, chart, labs and discussed the procedure including the risks, benefits and alternatives for the proposed anesthesia with the patient or authorized representative who has indicated his/her understanding and acceptance.   Dental advisory given  Plan Discussed with: CRNA  Anesthesia Plan Comments:        Anesthesia Quick Evaluation

## 2016-09-17 ENCOUNTER — Ambulatory Visit (HOSPITAL_BASED_OUTPATIENT_CLINIC_OR_DEPARTMENT_OTHER): Payer: PPO | Admitting: Anesthesiology

## 2016-09-17 ENCOUNTER — Encounter (HOSPITAL_BASED_OUTPATIENT_CLINIC_OR_DEPARTMENT_OTHER)
Admission: RE | Disposition: A | Discharge status: Home / Self Care | Payer: Self-pay | Source: Ambulatory Visit | Attending: Urology

## 2016-09-17 ENCOUNTER — Ambulatory Visit (HOSPITAL_BASED_OUTPATIENT_CLINIC_OR_DEPARTMENT_OTHER)
Admission: RE | Admit: 2016-09-17 | Discharge: 2016-09-17 | Disposition: A | Discharge status: Home / Self Care | Payer: PPO | Source: Ambulatory Visit | Attending: Urology | Admitting: Urology

## 2016-09-17 ENCOUNTER — Encounter (HOSPITAL_BASED_OUTPATIENT_CLINIC_OR_DEPARTMENT_OTHER): Payer: Self-pay | Admitting: *Deleted

## 2016-09-17 DIAGNOSIS — I129 Hypertensive chronic kidney disease with stage 1 through stage 4 chronic kidney disease, or unspecified chronic kidney disease: Secondary | ICD-10-CM | POA: Diagnosis not present

## 2016-09-17 DIAGNOSIS — N201 Calculus of ureter: Secondary | ICD-10-CM

## 2016-09-17 DIAGNOSIS — I495 Sick sinus syndrome: Secondary | ICD-10-CM | POA: Diagnosis not present

## 2016-09-17 DIAGNOSIS — K449 Diaphragmatic hernia without obstruction or gangrene: Secondary | ICD-10-CM | POA: Diagnosis not present

## 2016-09-17 DIAGNOSIS — I110 Hypertensive heart disease with heart failure: Secondary | ICD-10-CM

## 2016-09-17 DIAGNOSIS — G20 Parkinson's disease: Secondary | ICD-10-CM | POA: Diagnosis not present

## 2016-09-17 DIAGNOSIS — Z923 Personal history of irradiation: Secondary | ICD-10-CM

## 2016-09-17 DIAGNOSIS — I509 Heart failure, unspecified: Secondary | ICD-10-CM

## 2016-09-17 DIAGNOSIS — I248 Other forms of acute ischemic heart disease: Secondary | ICD-10-CM | POA: Diagnosis not present

## 2016-09-17 DIAGNOSIS — Z9221 Personal history of antineoplastic chemotherapy: Secondary | ICD-10-CM | POA: Insufficient documentation

## 2016-09-17 DIAGNOSIS — E7800 Pure hypercholesterolemia, unspecified: Secondary | ICD-10-CM

## 2016-09-17 DIAGNOSIS — Z79899 Other long term (current) drug therapy: Secondary | ICD-10-CM

## 2016-09-17 DIAGNOSIS — Z952 Presence of prosthetic heart valve: Secondary | ICD-10-CM

## 2016-09-17 DIAGNOSIS — Z7901 Long term (current) use of anticoagulants: Secondary | ICD-10-CM

## 2016-09-17 DIAGNOSIS — Z853 Personal history of malignant neoplasm of breast: Secondary | ICD-10-CM | POA: Insufficient documentation

## 2016-09-17 DIAGNOSIS — I480 Paroxysmal atrial fibrillation: Secondary | ICD-10-CM | POA: Diagnosis not present

## 2016-09-17 DIAGNOSIS — K5900 Constipation, unspecified: Secondary | ICD-10-CM | POA: Diagnosis not present

## 2016-09-17 DIAGNOSIS — I447 Left bundle-branch block, unspecified: Secondary | ICD-10-CM | POA: Diagnosis not present

## 2016-09-17 DIAGNOSIS — I351 Nonrheumatic aortic (valve) insufficiency: Secondary | ICD-10-CM | POA: Insufficient documentation

## 2016-09-17 DIAGNOSIS — N200 Calculus of kidney: Secondary | ICD-10-CM

## 2016-09-17 DIAGNOSIS — K219 Gastro-esophageal reflux disease without esophagitis: Secondary | ICD-10-CM | POA: Diagnosis not present

## 2016-09-17 DIAGNOSIS — I130 Hypertensive heart and chronic kidney disease with heart failure and stage 1 through stage 4 chronic kidney disease, or unspecified chronic kidney disease: Secondary | ICD-10-CM | POA: Diagnosis not present

## 2016-09-17 DIAGNOSIS — I4891 Unspecified atrial fibrillation: Secondary | ICD-10-CM | POA: Insufficient documentation

## 2016-09-17 DIAGNOSIS — N390 Urinary tract infection, site not specified: Secondary | ICD-10-CM | POA: Diagnosis not present

## 2016-09-17 HISTORY — DX: Malignant (primary) neoplasm, unspecified: C80.1

## 2016-09-17 HISTORY — PX: STONE EXTRACTION WITH BASKET: SHX5318

## 2016-09-17 HISTORY — DX: Personal history of malignant neoplasm of breast: Z85.3

## 2016-09-17 HISTORY — DX: Calculus of ureter: N20.1

## 2016-09-17 HISTORY — DX: Presence of prosthetic heart valve: Z95.2

## 2016-09-17 HISTORY — DX: Paroxysmal atrial fibrillation: I48.0

## 2016-09-17 HISTORY — DX: Stress incontinence (female) (male): N39.3

## 2016-09-17 HISTORY — DX: Diaphragmatic hernia without obstruction or gangrene: K44.9

## 2016-09-17 HISTORY — DX: Benign paroxysmal vertigo, unspecified ear: H81.10

## 2016-09-17 HISTORY — DX: Parkinsonism, unspecified: G20.C

## 2016-09-17 HISTORY — DX: Chronic kidney disease, stage 2 (mild): N18.2

## 2016-09-17 HISTORY — DX: Personal history of colonic polyps: Z86.010

## 2016-09-17 HISTORY — DX: Other chronic pain: G89.29

## 2016-09-17 HISTORY — DX: Hyperlipidemia, unspecified: E78.5

## 2016-09-17 HISTORY — DX: History of falling: Z91.81

## 2016-09-17 HISTORY — DX: Parkinson's disease: G20

## 2016-09-17 HISTORY — DX: Diverticulosis of large intestine without perforation or abscess without bleeding: K57.30

## 2016-09-17 HISTORY — DX: Left bundle-branch block, unspecified: I44.7

## 2016-09-17 HISTORY — DX: Personal history of other infectious and parasitic diseases: Z86.19

## 2016-09-17 HISTORY — PX: HOLMIUM LASER APPLICATION: SHX5852

## 2016-09-17 HISTORY — DX: Cervicalgia: M54.2

## 2016-09-17 HISTORY — PX: CYSTOSCOPY WITH RETROGRADE PYELOGRAM, URETEROSCOPY AND STENT PLACEMENT: SHX5789

## 2016-09-17 HISTORY — DX: Spondylosis without myelopathy or radiculopathy, cervical region: M47.812

## 2016-09-17 HISTORY — DX: Personal history of other diseases of the circulatory system: Z86.79

## 2016-09-17 SURGERY — CYSTOURETEROSCOPY, WITH RETROGRADE PYELOGRAM AND STENT INSERTION
Anesthesia: General | Site: Renal | Laterality: Right

## 2016-09-17 MED ORDER — EPHEDRINE SULFATE 50 MG/ML IJ SOLN
INTRAMUSCULAR | Status: DC | PRN
  Administered 2016-09-17 (×4): 10 mg via INTRAVENOUS

## 2016-09-17 MED ORDER — DEXAMETHASONE SODIUM PHOSPHATE 4 MG/ML IJ SOLN
INTRAMUSCULAR | Status: DC | PRN
  Administered 2016-09-17: 8 mg via INTRAVENOUS

## 2016-09-17 MED ORDER — ARTIFICIAL TEARS OP OINT
TOPICAL_OINTMENT | OPHTHALMIC | Status: AC
  Filled 2016-09-17: qty 3.5

## 2016-09-17 MED ORDER — CIPROFLOXACIN IN D5W 400 MG/200ML IV SOLN
INTRAVENOUS | Status: AC
  Filled 2016-09-17: qty 200

## 2016-09-17 MED ORDER — ONDANSETRON HCL 4 MG/2ML IJ SOLN
INTRAMUSCULAR | Status: AC
  Filled 2016-09-17: qty 2

## 2016-09-17 MED ORDER — HYDROMORPHONE HCL 1 MG/ML IJ SOLN
INTRAMUSCULAR | Status: AC
  Filled 2016-09-17: qty 1

## 2016-09-17 MED ORDER — LIDOCAINE 2% (20 MG/ML) 5 ML SYRINGE
INTRAMUSCULAR | Status: AC
  Filled 2016-09-17: qty 5

## 2016-09-17 MED ORDER — PHENAZOPYRIDINE HCL 100 MG PO TABS
ORAL_TABLET | ORAL | Status: AC
  Filled 2016-09-17: qty 2

## 2016-09-17 MED ORDER — FENTANYL CITRATE (PF) 100 MCG/2ML IJ SOLN
INTRAMUSCULAR | Status: AC
  Filled 2016-09-17: qty 2

## 2016-09-17 MED ORDER — IOHEXOL 300 MG/ML  SOLN
INTRAMUSCULAR | Status: DC | PRN
  Administered 2016-09-17: 29 mL via URETHRAL

## 2016-09-17 MED ORDER — SODIUM CHLORIDE 0.9 % IR SOLN
Status: DC | PRN
  Administered 2016-09-17: 1000 mL via INTRAVESICAL
  Administered 2016-09-17: 3000 mL via INTRAVESICAL

## 2016-09-17 MED ORDER — PROMETHAZINE HCL 25 MG/ML IJ SOLN
6.2500 mg | INTRAMUSCULAR | Status: DC | PRN
  Filled 2016-09-17: qty 1

## 2016-09-17 MED ORDER — MEPERIDINE HCL 25 MG/ML IJ SOLN
6.2500 mg | INTRAMUSCULAR | Status: DC | PRN
  Filled 2016-09-17: qty 1

## 2016-09-17 MED ORDER — PROPOFOL 10 MG/ML IV BOLUS
INTRAVENOUS | Status: DC | PRN
  Administered 2016-09-17: 120 mg via INTRAVENOUS

## 2016-09-17 MED ORDER — ONDANSETRON HCL 4 MG/2ML IJ SOLN
4.0000 mg | Freq: Once | INTRAMUSCULAR | Status: AC
  Administered 2016-09-17: 4 mg via INTRAVENOUS
  Filled 2016-09-17: qty 2

## 2016-09-17 MED ORDER — PHENAZOPYRIDINE HCL 200 MG PO TABS: 200.0000 mg | ORAL_TABLET | Freq: Three times a day (TID) | ORAL | 0 refills | Status: DC | PRN

## 2016-09-17 MED ORDER — ONDANSETRON HCL 4 MG/2ML IJ SOLN
INTRAMUSCULAR | Status: DC | PRN
  Administered 2016-09-17: 4 mg via INTRAVENOUS

## 2016-09-17 MED ORDER — LACTATED RINGERS IV SOLN
INTRAVENOUS | Status: DC
  Administered 2016-09-17: 07:00:00 via INTRAVENOUS
  Filled 2016-09-17: qty 1000

## 2016-09-17 MED ORDER — PROPOFOL 10 MG/ML IV BOLUS
INTRAVENOUS | Status: AC
  Filled 2016-09-17: qty 20

## 2016-09-17 MED ORDER — DEXAMETHASONE SODIUM PHOSPHATE 10 MG/ML IJ SOLN
INTRAMUSCULAR | Status: AC
Start: 2016-09-17 — End: 2016-09-17
  Filled 2016-09-17: qty 1

## 2016-09-17 MED ORDER — LIDOCAINE 2% (20 MG/ML) 5 ML SYRINGE
INTRAMUSCULAR | Status: DC | PRN
  Administered 2016-09-17: 50 mg via INTRAVENOUS

## 2016-09-17 MED ORDER — PHENAZOPYRIDINE HCL 200 MG PO TABS
200.0000 mg | ORAL_TABLET | Freq: Three times a day (TID) | ORAL | Status: DC
  Administered 2016-09-17: 200 mg via ORAL
  Filled 2016-09-17: qty 1

## 2016-09-17 MED ORDER — ACETAMINOPHEN 10 MG/ML IV SOLN
INTRAVENOUS | Status: AC
  Filled 2016-09-17: qty 100

## 2016-09-17 MED ORDER — EPHEDRINE 5 MG/ML INJ
INTRAVENOUS | Status: AC
  Filled 2016-09-17: qty 10

## 2016-09-17 MED ORDER — FENTANYL CITRATE (PF) 100 MCG/2ML IJ SOLN
INTRAMUSCULAR | Status: DC | PRN
  Administered 2016-09-17 (×3): 25 ug via INTRAVENOUS

## 2016-09-17 MED ORDER — CIPROFLOXACIN IN D5W 400 MG/200ML IV SOLN
400.0000 mg | INTRAVENOUS | Status: AC
  Administered 2016-09-17: 400 mg via INTRAVENOUS
  Filled 2016-09-17: qty 200

## 2016-09-17 MED ORDER — ACETAMINOPHEN 10 MG/ML IV SOLN
1000.0000 mg | Freq: Once | INTRAVENOUS | Status: AC
  Administered 2016-09-17: 1000 mg via INTRAVENOUS
  Filled 2016-09-17: qty 100

## 2016-09-17 MED ORDER — HYDROMORPHONE HCL 1 MG/ML IJ SOLN
0.2500 mg | INTRAMUSCULAR | Status: DC | PRN
  Administered 2016-09-17: 0.25 mg via INTRAVENOUS
  Filled 2016-09-17: qty 1

## 2016-09-17 SURGICAL SUPPLY — 18 items
BAG DRAIN URO-CYSTO SKYTR STRL (DRAIN) ×2 IMPLANT
BAG DRN UROCATH (DRAIN) ×1
BASKET DAKOTA 1.9FR 11X120 (BASKET) ×1 IMPLANT
CATH URET 5FR 28IN OPEN ENDED (CATHETERS) ×2 IMPLANT
CLOTH BEACON ORANGE TIMEOUT ST (SAFETY) ×2 IMPLANT
FIBER LASER TRAC TIP (UROLOGICAL SUPPLIES) ×1 IMPLANT
GLOVE BIO SURGEON STRL SZ7.5 (GLOVE) ×2 IMPLANT
GOWN STRL REUS W/ TWL XL LVL3 (GOWN DISPOSABLE) ×1 IMPLANT
GOWN STRL REUS W/TWL XL LVL3 (GOWN DISPOSABLE) ×4
GUIDEWIRE STR DUAL SENSOR (WIRE) ×3 IMPLANT
IV NS IRRIG 3000ML ARTHROMATIC (IV SOLUTION) ×4 IMPLANT
KIT ROOM TURNOVER WOR (KITS) ×2 IMPLANT
MANIFOLD NEPTUNE II (INSTRUMENTS) ×1 IMPLANT
PACK CYSTO (CUSTOM PROCEDURE TRAY) ×2 IMPLANT
SCOPE LITHOVU DISP 9.5FR 7.7FR (UROLOGICAL SUPPLIES) IMPLANT
SCOPE LITHOVUE DISPOSABLE (UROLOGICAL SUPPLIES) ×2
SHEATH ACCESS URETERAL 24CM (SHEATH) ×1 IMPLANT
STENT URET 6FRX24 CONTOUR (STENTS) ×1 IMPLANT

## 2016-09-17 NOTE — Anesthesia Procedure Notes (Signed)
Procedure Name: LMA Insertion Date/Time: 09/17/2016 7:44 AM Performed by: Nolon Nations Pre-anesthesia Checklist: Patient identified, Emergency Drugs available, Suction available and Patient being monitored Patient Re-evaluated:Patient Re-evaluated prior to inductionOxygen Delivery Method: Circle system utilized Preoxygenation: Pre-oxygenation with 100% oxygen Intubation Type: IV induction Ventilation: Mask ventilation without difficulty LMA: LMA inserted LMA Size: 4.0 Number of attempts: 1 Airway Equipment and Method: Bite block Placement Confirmation: positive ETCO2 Tube secured with: Tape Dental Injury: Teeth and Oropharynx as per pre-operative assessment

## 2016-09-17 NOTE — Discharge Instructions (Signed)
[facility] [phone] Post Ureteroscopy With or Without Stent Instructions  Definitions:  Ureter: The duct that transports urine from the kidney to the bladder. Stent:   A plastic hollow tube that is placed into the ureter, from the kidney to the                 bladder to prevent the ureter from swelling shut.  GENERAL INSTRUCTIONS:  Despite the fact that no skin incisions were used, the area around the ureter and bladder is raw and irritated. The stent is a foreign body which will further irritate the bladder wall. This irritation is manifested by increased frequency of urination, both day and night, and by an increase in the urge to urinate. In some, the urge to urinate is present almost always. Sometimes the urge is strong enough that you may not be able to stop yourself from urinating. The only real cure is to remove the stent and then give time for the bladder wall to heal which can't be done until the danger of the ureter swelling shut has passed, which varies.  You may see some blood in your urine while the stent is in place and a few days afterwards. Do not be alarmed, even if the urine was clear for a while. Get off your feet and drink lots of fluids until clearing occurs. If you start to pass clots or don't improve, call us.  DIET: You may return to your normal diet immediately. Because of the raw surface of your bladder, alcohol, spicy foods, acid type foods and drinks with caffeine may cause irritation or frequency and should be used in moderation. To keep your urine flowing freely and to avoid constipation, drink plenty of fluids during the day ( 8-10 glasses ). Tip: Avoid cranberry juice because it is very acidic.  ACTIVITY: Your physical activity doesn't need to be restricted. However, if you are very active, you may see some blood in your urine. We suggest that you reduce your activity under these circumstances until the bleeding has stopped.  BOWELS: It is  important to keep your bowels regular during the postoperative period. Straining with bowel movements can cause bleeding. A bowel movement every other day is reasonable. Use a mild laxative if needed, such as Milk of Magnesia 2-3 tablespoons, or 2 Dulcolax tablets. Call if you continue to have problems. If you have been taking narcotics for pain, before, during or after your surgery, you may be constipated. Take a laxative if necessary.   MEDICATION: You should resume your pre-surgery medications unless told not to. In addition you will often be given an antibiotic to prevent infection. These should be taken as prescribed until the bottles are finished unless you are having an unusual reaction to one of the drugs.  PROBLEMS YOU SHOULD REPORT TO [place]:  Fevers over 100.5 Fahrenheit.  Heavy bleeding, or clots ( See above notes about blood in urine ).  Inability to urinate.  Drug reactions ( hives, rash, nausea, vomiting, diarrhea ).  Severe burning or pain with urination that is not improving.  FOLLOW-UP: You will need a follow-up appointment to monitor your progress. Call for this appointment at the number listed above. Usually the first appointment will be about three to fourteen days after your surgery.    Post Anesthesia Home Care Instructions  Activity: Get plenty of rest for the remainder of the day. A responsible adult should stay with you for 24 hours following the procedure.  For the  next 24 hours, DO NOT: -Drive a car -Paediatric nurse -Drink alcoholic beverages -Take any medication unless instructed by your physician -Make any legal decisions or sign important papers.  Meals: Start with liquid foods such as gelatin or soup. Progress to regular foods as tolerated. Avoid greasy, spicy, heavy foods. If nausea and/or vomiting occur, drink only clear liquids until the nausea and/or vomiting subsides. Call your physician if vomiting continues.  Special  Instructions/Symptoms: Your throat may feel dry or sore from the anesthesia or the breathing tube placed in your throat during surgery. If this causes discomfort, gargle with warm salt water. The discomfort should disappear within 24 hours.  If you had a scopolamine patch placed behind your ear for the management of post- operative nausea and/or vomiting:  1. The medication in the patch is effective for 72 hours, after which it should be removed.  Wrap patch in a tissue and discard in the trash. Wash hands thoroughly with soap and water. 2. You may remove the patch earlier than 72 hours if you experience unpleasant side effects which may include dry mouth, dizziness or visual disturbances. 3. Avoid touching the patch. Wash your hands with soap and water after contact with the patch.   DISCHARGE INSTRUCTIONS FOR KIDNEY STONE/URETERAL STENT   MEDICATIONS:  1.  Resume all your other meds from home - except do not take any extra narcotic pain meds that you may have at home.  2. Pyridium is to help with the burning/stinging when you urinate.   ACTIVITY:  1. No strenuous activity x 1week  2. No driving while on narcotic pain medications  3. Drink plenty of water  4. Continue to walk at home - you can still get blood clots when you are at home, so keep active, but don't over do it.  5. May return to work/school tomorrow or when you feel ready   BATHING:  1. You can shower and we recommend daily showers    SIGNS/SYMPTOMS TO CALL:  Please call us if you have a fever greater than 101.5, uncontrolled nausea/vomiting, uncontrolled pain, dizziness, unable to urinate, bloody urine, chest pain, shortness of breath, leg swelling, leg pain, redness around wound, drainage from wound, or any other concerns or questions.   You can reach [place] at [phone].   FOLLOW-UP:  1. You have an appointment for stent removal in 1 week.

## 2016-09-17 NOTE — Transfer of Care (Signed)
Last Vitals:  Vitals:   09/17/16 0615 09/17/16 0932  BP: 135/62 (!) 153/74  Pulse: (!) 50 (!) 59  Resp: 16 12  Temp: 36.6 C 36.4 C    Last Pain:  Vitals:   09/17/16 0615  TempSrc: Oral      Patients Stated Pain Goal: 4 (09/17/16 6962)  Immediate Anesthesia Transfer of Care Note  Patient: [name]  Procedure(s) Performed: Procedure(s) (LRB): CYSTOSCOPY WITH RIGHT RETROGRADE PYELOGRAM, RIGHT URETEROSCOPY AND STENT PLACEMENT LASER LITHOTRIPSY, RIGHT STENT PLACEMENT (Right) HOLMIUM LASER APPLICATION (Right) STONE EXTRACTION WITH BASKET (Right)  Patient Location: PACU  Anesthesia Type: General  Level of Consciousness: awake, alert  and oriented  Airway & Oxygen Therapy: Patient Spontanous Breathing and Patient connected to face mask oxygen  Post-op Assessment: Report given to PACU RN and Post -op Vital signs reviewed and stable  Post vital signs: Reviewed and stable  Complications: No apparent anesthesia complications

## 2016-09-17 NOTE — H&P (Signed)
f/u for obstructing stone   HPI: [name] is a 75 year-old female established patient who is here for further eval and management of an obstructing stone.  The patient was last seen Aug 22.   The patient's stone is on her right side. The stone was 81mm right proximal ureter. There are additional stones within the urinary tract. They are located non-obstructing stone in left kidney.   The patient has not passed their stone since their last visit. The patient is complaining of flank pain. The patient denies fevers, chills, nausea, vomiting, flank pain, groin pain, or progressive voiding symptoms. The patient underwent No imaging prior to today's appointment. The patient has been taking no medications for their obstructing stone.   Found to have right proximal ureteral stones (2) that measure ~5-67mm. No evidence of obstruction the CT scan. Treated for UTI prior to referral.   The patient has a history of Parkinson-like syndrome. She also has a history of an aortic valve replacement.  The patient has a history of stones and has had shockwave lithotripsy as well as ureteroscopy.     ALLERGIES: Contrast Dye Demerol Oxycodone    MEDICATIONS: Oxybutynin Chloride 5 mg tablet  Simvastatin 20 mg tablet  Warfarin Sodium 5 mg tablet  Calcium Citrate  Carbidopa-Levodopa Er 25 mg-100 mg tablet, extended release  Daily Multiple Vitamin  Maxzide-25 Mg 37.5 mg-25 mg tablet  Metoprolol Succinate Er-Hctz 25 mg-12.5 mg tablet, extended release 24 hr  Omega 3  Pantoprazole Sodium 40 mg tablet, delayed release  Pepcid Complete  Vitamin B12  Vitamin C 1,000 mg tablet  Vitamin D3 2,000 unit capsule     GU PSH: Cystocele Repair - 1990 Cystoscopy - 07/27/2016 Hysterectomy Locm 300-399Mg /Ml Iodine,1Ml - 08/10/2016      PSH Notes: ovaries removed bladder tack 1990  breast cancer surgery followed by radiation/chemo  heart surgery-aortic valve replaced  foot surgery for hammer toe      NON-GU  PSH: Appendectomy Cataract Surgery Extensive Foot Surgery Remove Kidney Stone    GU PMH: Asymptomatic microscopic hematuria - 07/27/2016 Mixed incontinence - 07/27/2016 Nocturia - 07/27/2016      PMH Notes: atrial fibrillation  breast cancer   NON-GU PMH: Cardiac murmur, unspecified Elevated cancer antigen 125 [CA 125] Heartburn Personal history of other endocrine, nutritional and metabolic disease Pure hypercholesterolemia, unspecified    FAMILY HISTORY: 3 daughters - Other father deceased at age 8 - Other Kidney Failure - Brother Kidney Stones - Mother mother deceased at age 58 - Other Prostate Cancer - Father    Notes: mother died after complications from falling/broken hip  father died of heart complications   SOCIAL HISTORY: Marital Status: Married Current Smoking Status: Patient has never smoked.  Has never drank.  Drinks 1 caffeinated drink per day. Patient's occupation is/was retired.    REVIEW OF SYSTEMS:    GU Review Female:   Patient reports frequent urination, hard to postpone urination, and get up at night to urinate. Patient denies burning /pain with urination, leakage of urine, stream starts and stops, trouble starting your stream, have to strain to urinate, and currently pregnant.  Gastrointestinal (Upper):   Patient reports indigestion/ heartburn. Patient denies nausea and vomiting.  Gastrointestinal (Lower):   Patient denies diarrhea and constipation.  Constitutional:   Patient denies fever, night sweats, weight loss, and fatigue.  Skin:   Patient denies skin rash/ lesion and itching.  Eyes:   Patient denies blurred vision and double vision.  Ears/ Nose/ Throat:  Patient denies sore throat and sinus problems.  Hematologic/Lymphatic:   Patient reports easy bruising. Patient denies swollen glands.  Cardiovascular:   Patient denies leg swelling and chest pains.  Respiratory:   Patient reports cough. Patient denies shortness of breath.  Endocrine:   Patient  denies excessive thirst.  Musculoskeletal:   Patient reports back pain. Patient denies joint pain.  Neurological:   Patient reports headaches. Patient denies dizziness.  Psychologic:   Patient denies depression and anxiety.   VITAL SIGNS:      09/02/2016 01:50 PM  BP 138/68 mmHg  Pulse 41 /min   MULTI-SYSTEM PHYSICAL EXAMINATION:    Constitutional: Well-nourished. No physical deformities. Normally developed. Good grooming.  Respiratory: No labored breathing, no use of accessory muscles. CTA-B  Cardiovascular: Normal temperature, normal extremity pulses, no swelling, no varicosities. RRR     PAST DATA REVIEWED:  Source Of History:  Patient  Records Review:   Previous Doctor Records, Previous Patient Records  X-Ray Review: C.T. Abdomen/Pelvis: Reviewed Films. Discussed With Patient.     PROCEDURES:          Urinalysis w/Scope - 81001 Dipstick Dipstick Cont'd Micro  Specimen: Voided Bilirubin: Neg WBC/hpf: 0-5/hpf  Color: Yellow Ketones: Neg RBC/hpf: 0-2/hpf  Appearance: Clear Blood: Neg Bacteria: NS (Not Seen)  Specific Gravity: 1.015 Protein: Trace Cystals: NS (Not Seen)  pH: 6.0 Urobilinogen: 0.2 Casts: NS (Not Seen)  Glucose: Neg Nitrites: Neg Trichomonas: Not Present    Leukocyte Esterase: Trace Mucous: Not Present      Epithelial Cells: 0-5/hpf      Yeast: NS (Not Seen)      Sperm: Not Present    ASSESSMENT:      ICD-10 Details  1 GU:   Calculus Ureter - N20.1 Right, Acute - Patient continues to be symptomatic from her Right-sided nephrolithiasis. The stones are likely too large to pass and as such I recommended treatment.   PLAN:           Orders Labs Urine Culture and Sensitivity          Schedule Return Visit: ASAP - Schedule Surgery          Document Letter(s):  Created for Patient: Clinical Summary    I went over the treatment options for their stone. We discussed ongoing medical expulsion therapy, ESWL and ureteroscopy. Ultimately the patient and I  agreed that ureterscopy is the best option. I went over this surgery with the patient in detail. The patient understands after being put to sleep, we would proceed with a telescope to access the stone and potentially use a laser to fragment the stone before removing it with a basket. After removing the stone the patient will require temporary stent placement in the ureter. This is an outpatient procedure. I also discussed the potential of not being able to gain access safely into the ureter/kidney. This would require that a stent be placed and then the patient rescheduled several weeks later for a second attempt. They also understand the small risks of ureteral trauma causing a stricture or permanent damage. I also explained the risk of urinary tract infection. Having gone over the procedure itself, the expected outcome, and the risks/benefits the patient has agreed to proceed.        Notes:   The patient will need a Lovenox bridge from her Coumadin because of her [place] aortic valve. We will ask Dr. Leanna Battles to help organize this. She can resume her Coumadin the day after her  procedure.

## 2016-09-17 NOTE — Anesthesia Postprocedure Evaluation (Signed)
Anesthesia Post Note  Patient: JAMARIE JOPLIN  Procedure(s) Performed: Procedure(s) (LRB): CYSTOSCOPY WITH RIGHT RETROGRADE PYELOGRAM, RIGHT URETEROSCOPY AND STENT PLACEMENT LASER LITHOTRIPSY, RIGHT STENT PLACEMENT (Right) HOLMIUM LASER APPLICATION (Right) STONE EXTRACTION WITH BASKET (Right)  Patient location during evaluation: PACU Anesthesia Type: General Level of consciousness: sedated and patient cooperative Pain management: pain level controlled Vital Signs Assessment: post-procedure vital signs reviewed and stable Respiratory status: spontaneous breathing Cardiovascular status: stable Anesthetic complications: no    Last Vitals:  Vitals:   09/17/16 1030 09/17/16 1055  BP: (!) 147/66   Pulse: (!) 50 (!) 51  Resp: (!) 9 (!) 8  Temp:      Last Pain:  Vitals:   09/17/16 1023  TempSrc:   PainSc: [place]

## 2016-09-17 NOTE — Op Note (Signed)
Preoperative diagnosis: right ureteral calculus  Postoperative diagnosis: right ureteral calculus  Procedure:  1. Cystoscopy 2. right ureteroscopy and stone removal 3. Ureteroscopic laser lithotripsy 4. right 42F x 24 ureteral stent placement  5. right retrograde pyelography with interpretation  Surgeon: [name], MD  Anesthesia: General  Complications: None  Intraoperative findings: Right retrograde pyelography demonstrated a filling defect within the right ureter consistent with the patient's known calculus without other abnormalities.  Significant prolapse of bladder.  EBL: Minimal  Specimens: 1. right ureteral calculus  Disposition of specimens: [facility] for stone analysis  Indication: [name] is a 75 y.o.   patient with a right ureteral stone and associated right symptoms. After reviewing the management options for treatment, the patient elected to proceed with the above surgical procedure(s). We have discussed the potential benefits and risks of the procedure, side effects of the proposed treatment, the likelihood of the patient achieving the goals of the procedure, and any potential problems that might occur during the procedure or recuperation. Informed consent has been obtained.   Description of procedure:  The patient was taken to the operating room and general anesthesia was induced.  The patient was placed in the dorsal lithotomy position, prepped and draped in the usual sterile fashion, and preoperative antibiotics were administered. A preoperative time-out was performed.   Cystourethroscopy was performed.  The patient's urethra was examined and was normal. . The bladder was then systematically examined in its entirety. There was no evidence for any bladder tumors, stones, or other mucosal pathology.    Attention then turned to the right ureteral orifice and a ureteral catheter was used to intubate the ureteral orifice.  Omnipaque  contrast was injected through the ureteral catheter and a retrograde pyelogram was performed with findings as dictated above.  A 0.38 sensor guidewire was then advanced up the right ureter into the renal pelvis under fluoroscopic guidance. The 6 Fr semirigid ureteroscope was then advanced into the ureter next to the guidewire and advanced up to the renal pelvis. No stone was noted, the stones had been pushed back into the kidney. I then passed a 12/14 [place] times 24 cm ureteral access sheath into the proximal ureter removed the inner sheath. I then advanced a flexible ureteroscope through the sheath and into the renal pelvis. Pyeloscopy was performed and 2 stone were noted. I placed a stones both in the upper pole and then fragmented them with a 200  laser fiber.  The large fragments were then removed. The smaller fragments were then dusted into small enough fragments that should be easily passable. I injected contrast through the scope and performed pyeloscopy again under fluoroscopic guidance to ensure that all large stone fragments had been removed. I then slowly backed out the ureteroscope and the access sheath simultaneously ensuring that there were no additional stone fragments or damage to the ureter.  The wire was then backloaded through the cystoscope and a ureteral stent was advance over the wire using Seldinger technique.  The stent was positioned appropriately under fluoroscopic and cystoscopic guidance.  The wire was then removed with an adequate stent curl noted in the renal pelvis as well as in the bladder. The stent tether was removed.  The bladder was then emptied and the procedure ended.  The patient appeared to tolerate the procedure well and without complications.  The patient was able to be awakened and transferred to the recovery unit in satisfactory condition.   Disposition: The patient will be  scheduled for stent removal in 5-7 days in our clinic.

## 2016-09-19 ENCOUNTER — Ambulatory Visit (HOSPITAL_COMMUNITY): Payer: PPO

## 2016-09-19 ENCOUNTER — Inpatient Hospital Stay (HOSPITAL_COMMUNITY)
Admission: EM | Admit: 2016-09-19 | Discharge: 2016-09-21 | DRG: 988 | Disposition: A | Discharge status: Home / Self Care | Payer: PPO | Attending: Oncology | Admitting: Oncology

## 2016-09-19 ENCOUNTER — Encounter (HOSPITAL_COMMUNITY): Payer: Self-pay

## 2016-09-19 DIAGNOSIS — K5900 Constipation, unspecified: Secondary | ICD-10-CM | POA: Diagnosis not present

## 2016-09-19 DIAGNOSIS — Z9221 Personal history of antineoplastic chemotherapy: Secondary | ICD-10-CM | POA: Diagnosis not present

## 2016-09-19 DIAGNOSIS — K219 Gastro-esophageal reflux disease without esophagitis: Secondary | ICD-10-CM | POA: Diagnosis not present

## 2016-09-19 DIAGNOSIS — Z7901 Long term (current) use of anticoagulants: Secondary | ICD-10-CM

## 2016-09-19 DIAGNOSIS — R112 Nausea with vomiting, unspecified: Secondary | ICD-10-CM | POA: Diagnosis not present

## 2016-09-19 DIAGNOSIS — I10 Essential (primary) hypertension: Secondary | ICD-10-CM | POA: Diagnosis not present

## 2016-09-19 DIAGNOSIS — I509 Heart failure, unspecified: Secondary | ICD-10-CM | POA: Diagnosis not present

## 2016-09-19 DIAGNOSIS — K2970 Gastritis, unspecified, without bleeding: Secondary | ICD-10-CM | POA: Diagnosis not present

## 2016-09-19 DIAGNOSIS — Z923 Personal history of irradiation: Secondary | ICD-10-CM | POA: Diagnosis not present

## 2016-09-19 DIAGNOSIS — R7989 Other specified abnormal findings of blood chemistry: Secondary | ICD-10-CM | POA: Diagnosis present

## 2016-09-19 DIAGNOSIS — R778 Other specified abnormalities of plasma proteins: Secondary | ICD-10-CM | POA: Diagnosis present

## 2016-09-19 DIAGNOSIS — Z79899 Other long term (current) drug therapy: Secondary | ICD-10-CM

## 2016-09-19 DIAGNOSIS — I480 Paroxysmal atrial fibrillation: Principal | ICD-10-CM | POA: Diagnosis present

## 2016-09-19 DIAGNOSIS — R1110 Vomiting, unspecified: Secondary | ICD-10-CM | POA: Diagnosis present

## 2016-09-19 DIAGNOSIS — I495 Sick sinus syndrome: Secondary | ICD-10-CM | POA: Diagnosis present

## 2016-09-19 DIAGNOSIS — R109 Unspecified abdominal pain: Secondary | ICD-10-CM | POA: Diagnosis not present

## 2016-09-19 DIAGNOSIS — I447 Left bundle-branch block, unspecified: Secondary | ICD-10-CM | POA: Diagnosis present

## 2016-09-19 DIAGNOSIS — Z952 Presence of prosthetic heart valve: Secondary | ICD-10-CM | POA: Diagnosis not present

## 2016-09-19 DIAGNOSIS — R0789 Other chest pain: Secondary | ICD-10-CM | POA: Diagnosis not present

## 2016-09-19 DIAGNOSIS — B9689 Other specified bacterial agents as the cause of diseases classified elsewhere: Secondary | ICD-10-CM | POA: Diagnosis not present

## 2016-09-19 DIAGNOSIS — R079 Chest pain, unspecified: Secondary | ICD-10-CM | POA: Diagnosis not present

## 2016-09-19 DIAGNOSIS — K449 Diaphragmatic hernia without obstruction or gangrene: Secondary | ICD-10-CM

## 2016-09-19 DIAGNOSIS — G20 Parkinson's disease: Secondary | ICD-10-CM | POA: Diagnosis not present

## 2016-09-19 DIAGNOSIS — R1111 Vomiting without nausea: Secondary | ICD-10-CM | POA: Diagnosis not present

## 2016-09-19 DIAGNOSIS — E785 Hyperlipidemia, unspecified: Secondary | ICD-10-CM | POA: Diagnosis not present

## 2016-09-19 DIAGNOSIS — I248 Other forms of acute ischemic heart disease: Secondary | ICD-10-CM | POA: Diagnosis not present

## 2016-09-19 DIAGNOSIS — N201 Calculus of ureter: Secondary | ICD-10-CM | POA: Diagnosis present

## 2016-09-19 DIAGNOSIS — I4891 Unspecified atrial fibrillation: Secondary | ICD-10-CM | POA: Diagnosis present

## 2016-09-19 DIAGNOSIS — E7800 Pure hypercholesterolemia, unspecified: Secondary | ICD-10-CM | POA: Diagnosis present

## 2016-09-19 DIAGNOSIS — N182 Chronic kidney disease, stage 2 (mild): Secondary | ICD-10-CM | POA: Diagnosis not present

## 2016-09-19 DIAGNOSIS — Z853 Personal history of malignant neoplasm of breast: Secondary | ICD-10-CM | POA: Diagnosis not present

## 2016-09-19 DIAGNOSIS — I130 Hypertensive heart and chronic kidney disease with heart failure and stage 1 through stage 4 chronic kidney disease, or unspecified chronic kidney disease: Secondary | ICD-10-CM | POA: Diagnosis present

## 2016-09-19 DIAGNOSIS — N390 Urinary tract infection, site not specified: Secondary | ICD-10-CM | POA: Diagnosis present

## 2016-09-19 DIAGNOSIS — R001 Bradycardia, unspecified: Secondary | ICD-10-CM | POA: Diagnosis not present

## 2016-09-19 LAB — CBC WITH DIFFERENTIAL/PLATELET
Basophils Absolute: 0 10*3/uL (ref 0.0–0.1)
Basophils Relative: 0 %
Eosinophils Absolute: 0 10*3/uL (ref 0.0–0.7)
Eosinophils Relative: 0 %
HCT: 35.2 % — ABNORMAL LOW (ref 36.0–46.0)
Hemoglobin: 11.6 g/dL — ABNORMAL LOW (ref 12.0–15.0)
Lymphocytes Relative: 9 %
Lymphs Abs: 0.9 10*3/uL (ref 0.7–4.0)
MCH: 33.6 pg (ref 26.0–34.0)
MCHC: 33 g/dL (ref 30.0–36.0)
MCV: 102 fL — ABNORMAL HIGH (ref 78.0–100.0)
Monocytes Absolute: 0.8 10*3/uL (ref 0.1–1.0)
Monocytes Relative: 7 %
Neutro Abs: 9.1 10*3/uL — ABNORMAL HIGH (ref 1.7–7.7)
Neutrophils Relative %: 84 %
Platelets: 160 10*3/uL (ref 150–400)
RBC: 3.45 MIL/uL — ABNORMAL LOW (ref 3.87–5.11)
RDW: 13.2 % (ref 11.5–15.5)
WBC: 10.8 10*3/uL — ABNORMAL HIGH (ref 4.0–10.5)

## 2016-09-19 LAB — URINE MICROSCOPIC-ADD ON: Squamous Epithelial / LPF: NONE SEEN

## 2016-09-19 LAB — COMPREHENSIVE METABOLIC PANEL
ALT: 7 U/L — ABNORMAL LOW (ref 14–54)
AST: 36 U/L (ref 15–41)
Albumin: 3.6 g/dL (ref 3.5–5.0)
Alkaline Phosphatase: 47 U/L (ref 38–126)
Anion gap: 9 (ref 5–15)
BUN: 18 mg/dL (ref 6–20)
CO2: 28 mmol/L (ref 22–32)
Calcium: 9.8 mg/dL (ref 8.9–10.3)
Chloride: 98 mmol/L — ABNORMAL LOW (ref 101–111)
Creatinine, Ser: 1.55 mg/dL — ABNORMAL HIGH (ref 0.44–1.00)
GFR calc Af Amer: 36 mL/min — ABNORMAL LOW (ref >60)
GFR calc non Af Amer: 31 mL/min — ABNORMAL LOW (ref >60)
Glucose, Bld: 120 mg/dL — ABNORMAL HIGH (ref 65–99)
Potassium: 3.5 mmol/L (ref 3.5–5.1)
Sodium: 135 mmol/L (ref 135–145)
Total Bilirubin: 0.8 mg/dL (ref 0.3–1.2)
Total Protein: 6.4 g/dL — ABNORMAL LOW (ref 6.5–8.1)

## 2016-09-19 LAB — LIPASE, BLOOD: Lipase: 19 U/L (ref 11–51)

## 2016-09-19 LAB — URINALYSIS, ROUTINE W REFLEX MICROSCOPIC
Glucose, UA: 100 mg/dL — AB
Ketones, ur: 15 mg/dL — AB
Nitrite: POSITIVE — AB
Protein, ur: 100 mg/dL — AB
Specific Gravity, Urine: 1.02 (ref 1.005–1.030)
pH: 7.5 (ref 5.0–8.0)

## 2016-09-19 LAB — TROPONIN I
Troponin I: 0.37 ng/mL (ref <0.03)
Troponin I: 0.27 ng/mL (ref <0.03)

## 2016-09-19 LAB — PROTIME-INR
INR: 1.43
Prothrombin Time: 17.6 seconds — ABNORMAL HIGH (ref 11.4–15.2)

## 2016-09-19 LAB — I-STAT TROPONIN, ED: Troponin i, poc: 0.09 ng/mL (ref 0.00–0.08)

## 2016-09-19 MED ORDER — ACETAMINOPHEN 650 MG RE SUPP: 650.0000 mg | Freq: Four times a day (QID) | RECTAL | Status: DC | PRN

## 2016-09-19 MED ORDER — CEFTRIAXONE SODIUM 1 G IJ SOLR
1.0000 g | INTRAMUSCULAR | Status: DC
  Administered 2016-09-19 – 2016-09-20 (×2): 1 g via INTRAVENOUS
  Filled 2016-09-19 (×3): qty 10

## 2016-09-19 MED ORDER — MORPHINE SULFATE (PF) 4 MG/ML IV SOLN
4.0000 mg | Freq: Once | INTRAVENOUS | Status: AC
  Administered 2016-09-19: 4 mg via INTRAVENOUS
  Filled 2016-09-19: qty 1

## 2016-09-19 MED ORDER — DEXTROSE 5 % IV SOLN: 1.0000 g | INTRAVENOUS | Status: DC

## 2016-09-19 MED ORDER — FAMOTIDINE IN NACL 20-0.9 MG/50ML-% IV SOLN
20.0000 mg | Freq: Once | INTRAVENOUS | Status: AC
  Administered 2016-09-19: 20 mg via INTRAVENOUS
  Filled 2016-09-19: qty 50

## 2016-09-19 MED ORDER — SENNA 8.6 MG PO TABS
1.0000 | ORAL_TABLET | Freq: Two times a day (BID) | ORAL | Status: DC
  Administered 2016-09-19 – 2016-09-21 (×4): 8.6 mg via ORAL
  Filled 2016-09-19 (×4): qty 1

## 2016-09-19 MED ORDER — ENOXAPARIN SODIUM 80 MG/0.8ML ~~LOC~~ SOLN: 70.0000 mg | Freq: Two times a day (BID) | SUBCUTANEOUS | Status: DC

## 2016-09-19 MED ORDER — MECLIZINE HCL 25 MG PO TABS: 25.0000 mg | ORAL_TABLET | Freq: Once | ORAL | Status: DC

## 2016-09-19 MED ORDER — ACETAMINOPHEN 325 MG PO TABS
650.0000 mg | ORAL_TABLET | Freq: Four times a day (QID) | ORAL | Status: DC | PRN
  Administered 2016-09-19 – 2016-09-21 (×7): 650 mg via ORAL
  Filled 2016-09-19 (×7): qty 2

## 2016-09-19 MED ORDER — METOCLOPRAMIDE HCL 5 MG/ML IJ SOLN
5.0000 mg | Freq: Once | INTRAMUSCULAR | Status: AC
  Administered 2016-09-19: 5 mg via INTRAVENOUS
  Filled 2016-09-19: qty 2

## 2016-09-19 MED ORDER — POLYETHYLENE GLYCOL 3350 17 G PO PACK
17.0000 g | PACK | Freq: Every day | ORAL | Status: DC
  Administered 2016-09-20: 17 g via ORAL
  Filled 2016-09-19 (×3): qty 1

## 2016-09-19 MED ORDER — ENOXAPARIN SODIUM 80 MG/0.8ML ~~LOC~~ SOLN
1.0000 mg/kg | SUBCUTANEOUS | Status: DC
  Administered 2016-09-20 – 2016-09-21 (×2): 65 mg via SUBCUTANEOUS
  Filled 2016-09-19 (×2): qty 0.8

## 2016-09-19 MED ORDER — TRIAMTERENE-HCTZ 37.5-25 MG PO TABS
1.0000 | ORAL_TABLET | Freq: Every day | ORAL | Status: DC
  Administered 2016-09-19 – 2016-09-21 (×2): 1 via ORAL
  Filled 2016-09-19 (×3): qty 1

## 2016-09-19 MED ORDER — ONDANSETRON HCL 4 MG/2ML IJ SOLN
4.0000 mg | Freq: Once | INTRAMUSCULAR | Status: AC
  Administered 2016-09-19: 4 mg via INTRAVENOUS
  Filled 2016-09-19: qty 2

## 2016-09-19 MED ORDER — OXYBUTYNIN CHLORIDE 5 MG PO TABS
2.5000 mg | ORAL_TABLET | Freq: Two times a day (BID) | ORAL | Status: DC
  Administered 2016-09-19 – 2016-09-21 (×5): 2.5 mg via ORAL
  Filled 2016-09-19 (×5): qty 1

## 2016-09-19 MED ORDER — ONDANSETRON HCL 4 MG PO TABS
4.0000 mg | ORAL_TABLET | Freq: Four times a day (QID) | ORAL | Status: DC | PRN
  Administered 2016-09-19: 4 mg via ORAL
  Filled 2016-09-19: qty 1

## 2016-09-19 MED ORDER — SIMVASTATIN 20 MG PO TABS
20.0000 mg | ORAL_TABLET | Freq: Every evening | ORAL | Status: DC
  Administered 2016-09-19 – 2016-09-21 (×3): 20 mg via ORAL
  Filled 2016-09-19 (×3): qty 1

## 2016-09-19 MED ORDER — CARBIDOPA-LEVODOPA 25-100 MG PO TABS
2.0000 | ORAL_TABLET | Freq: Three times a day (TID) | ORAL | Status: DC
  Administered 2016-09-19 – 2016-09-21 (×8): 2 via ORAL
  Filled 2016-09-19 (×3): qty 2
  Filled 2016-09-19: qty 1
  Filled 2016-09-19 (×4): qty 2

## 2016-09-19 MED ORDER — WARFARIN - PHARMACIST DOSING INPATIENT: Freq: Every day | Status: DC

## 2016-09-19 MED ORDER — SODIUM CHLORIDE 0.9% FLUSH
3.0000 mL | Freq: Two times a day (BID) | INTRAVENOUS | Status: DC
  Administered 2016-09-19 – 2016-09-21 (×2): 3 mL via INTRAVENOUS

## 2016-09-19 MED ORDER — WARFARIN SODIUM 7.5 MG PO TABS
7.5000 mg | ORAL_TABLET | Freq: Once | ORAL | Status: AC
  Administered 2016-09-19: 7.5 mg via ORAL
  Filled 2016-09-19: qty 1

## 2016-09-19 MED ORDER — ONDANSETRON HCL 4 MG/2ML IJ SOLN
4.0000 mg | Freq: Four times a day (QID) | INTRAMUSCULAR | Status: DC | PRN
  Administered 2016-09-19 – 2016-09-21 (×2): 4 mg via INTRAVENOUS
  Filled 2016-09-19 (×2): qty 2

## 2016-09-19 MED ORDER — SODIUM CHLORIDE 0.9 % IV SOLN
INTRAVENOUS | Status: AC
  Administered 2016-09-19 (×2): via INTRAVENOUS

## 2016-09-19 MED ORDER — PANTOPRAZOLE SODIUM 40 MG PO TBEC
40.0000 mg | DELAYED_RELEASE_TABLET | Freq: Every day | ORAL | Status: DC
  Administered 2016-09-19 – 2016-09-21 (×3): 40 mg via ORAL
  Filled 2016-09-19 (×3): qty 1

## 2016-09-19 MED ORDER — ENOXAPARIN SODIUM 80 MG/0.8ML ~~LOC~~ SOLN
1.0000 mg/kg | Freq: Once | SUBCUTANEOUS | Status: AC
  Administered 2016-09-19: 65 mg via SUBCUTANEOUS
  Filled 2016-09-19 (×2): qty 0.8

## 2016-09-19 NOTE — Consult Note (Signed)
CARDIOLOGY CONSULT NOTE  Patient ID: [name], MRN: [id number], DOB/AGE: 1941/07/13 75 y.o. Admit date: 09/19/2016 Date of Consult: 09/19/2016  Primary Physician: Donnajean Lopes, MD Primary Cardiologist: [facility]  Chief Complaint: afib and + troponin   HPI [name] is a 75 y.o. female  Came to ER with CP/ SOB and palpitations and found by EMS to have AFib RVR which converted spontaneously   Her other complaint has been abd pain and nausea which developed yesterday pm and has persisted.    She underewent procedure for kidney stone on Friday WITH LMWH bridging  She has hx of sinus bradycardia  HR 40's, seen last week by  prompting recent discontinuation of beta blockers  She has hx of aortic valve disease and is status post aortic valve replacement with mechanical St Jude's prosthesis in 2007 and history of paroxysmal atrial fibrillation. She is on chronic anticoagulation. . 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 She has a history of hypertension. She also is followed by Neurology for gait instability and recurrent falls. MRI showed chronic microvascular changes.     Past Medical History:  Diagnosis Date  . Anticoagulated on Coumadin   . Arthropathy of cervical facet joint (HCC)    C2-3  . At risk for falls   . BPV (benign positional vertigo)   . Bruises easily   . Cancer ([place]) 2004    breast-lumpectomy,nodes ,radiation,chemo  . CHF (congestive heart failure) ([place])    per last echo 10-24-2014 ef 40-45%  . Chronic neck pain   . CKD (chronic kidney disease), stage II   . Diverticulosis of colon   . GERD (gastroesophageal reflux disease)   . Hiatal hernia   . History of aortic valve stenosis   . History of breast cancer per pt no recurrence   dx 2004-- Left breast DCIS (ER/PR+, HER2 negative) s/p  partial masectomy w/ sln bx and  chemoradiotion  (completed 2004)  . History of colon polyps   . History of herpes zoster   . History of kidney stones   . History of kidney stones   . HTN (hypertension)   . Hyperlipidemia   . Irritable bowel syndrome   . LBBB (left bundle branch block)   . PAF (paroxysmal atrial fibrillation) [facility])    cardiologist-  dr Martinique  . Parkinsonism [facility])    neurologist-  dr tat  . PONV (postoperative nausea and vomiting)   . PVC's (premature ventricular contractions)   . Right ureteral stone   . S/P aortic valve replacement with prosthetic valve    04-13-2006  St Jude mechnical  for severe stenosis  . SUI (stress urinary incontinence, female)   . Unstable balance    uses cane  . Venous varices        Surgical History:  Past Surgical History:  Procedure Laterality Date  . ANTERIOR AND POSTERIOR REPAIR  1990   cystocele/ rectocele  . AORTIC VALVE REPLACEMENT  04/13/06   dr gerhardt   and Replacement Ascending Aorta (St. Jude mechanical prosthesis)  . APPENDECTOMY  child 1950  . BREAST SURGERY Left 2004  . CARDIAC CATHETERIZATION  02-22-2001   dr Martinique   minimal non-obstructive cad/  mild aortic stenosis and aortic root enlargement/  normal LVF, ef 65%  . CARDIAC CATHETERIZATION  03-22-2006    dr Martinique   no significant obstructive  cad/  severe aortic stenosis and mild to moderate aortic root enlargement/  upper normal right heart pressures  . CARDIOVASCULAR STRESS TEST  09-30-2011   dr Martinique   lexiscan nuclear study w/ apical thinning  vs  small prior apical infarct w/ no significant ischemia/  hypokinesis of the distal septum and apex, ef 50%  . CARPAL TUNNEL RELEASE Bilateral left 09-09-2004/  right 2007  . CATARACT EXTRACTION W/ INTRAOCULAR LENS  IMPLANT, BILATERAL  2015  . COLONOSCOPY N/A 10/03/2014   Procedure: COLONOSCOPY;  Surgeon: [name], MD;  Location: [facility];  Service: Endoscopy;  Laterality: N/A;  . ESOPHAGEAL MANOMETRY N/A 08/12/2014   Procedure: ESOPHAGEAL MANOMETRY  (EM);  Surgeon: [name], MD;  Location: [facility] ENDOSCOPY;  Service: Endoscopy;  Laterality: N/A;  . ESOPHAGOGASTRODUODENOSCOPY N/A 10/03/2014   Procedure: ESOPHAGOGASTRODUODENOSCOPY (EGD);  Surgeon: [name], MD;  Location[facility] ENDOSCOPY;  Service: Endoscopy;  Laterality: N/A;  . Edith Endave  . FOOT SURGERY Right 2013   hammertoe x2  . PERCUTANEOUS NEPHROSTOLITHOTOMY  1993  . TOTAL ABDOMINAL HYSTERECTOMY  1971   w/ Bilateral Salpingoophorectomy  . TRANSTHORACIC ECHOCARDIOGRAM  10/24/2014   dr Martinique   hypokinesis of the basal and mid inferoseptal and inferior walls,  ef 40-45%,  grade 1 diastolic dysfunction/  mechanical bileaflet aortic valve noted w/ mild regur., peak grandient 66mHg, valve area 1.35cm^2/  severe MV calcification without stenosis w/ mild regurg., peak gradient 355mg/  mild LAE/  poss. atrial mass (MRI showed benign lipomatous hypertrophy of atrial septum) /tFrazier ButtR/mild TR     Home Meds: Prior to Admission medications   Medication Sig Start Date End Date Taking? Authorizing Provider  calcium citrate-vitamin D (CITRACAL+D) 315-200 MG-UNIT per tablet Take 1 tablet by mouth 3 (three) times daily.   Yes Historical Provider, MD  carbidopa-levodopa (SINEMET IR) 25-100 MG tablet Take 2 tablets by mouth 3 (three) times daily. 07/06/16  Yes Rebecca S Tat, DO  Cholecalciferol (VITAMIN D3) 2000 units TABS Take 1 capsule by mouth daily.   Yes Historical Provider, MD  enoxaparin (LOVENOX) 80 MG/0.8ML injection Inject 80 mg into the skin 2 (two) times daily.  09/10/16  Yes Historical Provider, MD  famotidine-calcium carbonate-magnesium hydroxide (PEPCID COMPLETE) 10-800-165 MG CHEW chewable tablet Chew 1 tablet by mouth daily as needed (heartburn).   Yes Historical Provider, MD  Multiple Vitamin (MULTIVITAMIN) tablet Take 1 tablet by mouth daily at 12 noon.    Yes Historical Provider, MD  Multiple Vitamins-Minerals (VISION-VITE PRESERVE PO) Take 1  tablet by mouth 2 (two) times daily.   Yes Historical Provider, MD  oxybutynin (DITROPAN) 5 MG tablet Take 2.5 mg by mouth 2 (two) times daily.    Yes Historical Provider, MD  pantoprazole (PROTONIX) 40 MG tablet Take 40 mg by mouth every morning.    Yes Historical Provider, MD  phenazopyridine (PYRIDIUM) 200 MG tablet Take 1 tablet (200 mg total) by mouth 3 (three) times daily as needed for pain. 09/17/16  Yes BeArdis HughsMD  simvastatin (ZOCOR) 20 MG tablet Take 20 mg by mouth every evening.   Yes Historical Provider, MD  triamterene-hydrochlorothiazide (MAXZIDE-25) 37.5-25 MG per tablet Take 1 tablet by mouth every morning.    Yes Historical Provider, MD  UNABLE TO FIND Take 900 mg by mouth daily at 12 noon. Omega Red    Yes Historical Provider, MD  vitamin B-12 (CYANOCOBALAMIN) 1000 MCG tablet Take 1,000 mcg by mouth every morning.  Yes Historical Provider, MD  warfarin (COUMADIN) 5 MG tablet Take 5 mg by mouth every evening. 7.5 mg every Tues, Thurs, Sat; 5 mg all other days   Yes Historical Provider, MD    Inpatient Medications:  . enoxaparin (LOVENOX) injection  1 mg/kg Subcutaneous Once    Allergies:  Allergies  Allergen Reactions  . Demerol [Meperidine] Shortness Of Breath and Swelling  . Oxycodone Other (See Comments)    Hallucinations   . Adhesive [Tape] Other (See Comments)    Redness and skin tears  . Ivp Dye [Iodinated Diagnostic Agents] Hives    OK with benadryl pre-med (38m one hour before receiving iodinated contrast agent)  . Phenergan [Promethazine] Other (See Comments)    Avoids due to reaction with current medications    Social History   Social History  . Marital status: Married    Spouse name: N/A  . Number of children: 3  . Years of education: 10   Occupational History  . bakery/caterer Other    retired   Social History Main Topics  . Smoking status: Never Smoker  . Smokeless tobacco: Never Used  . Alcohol use No  . Drug use: No  . Sexual  activity: Not on file   Other Topics Concern  . Not on file   Social History Narrative      1/2 -1 soda a day       Family History  Problem Relation Age of Onset  . Heart disease Mother   . Heart disease Father 757   CABG  . Other Brother     AVR     ROS:  Please see the history of present illness.     All other systems reviewed and negative.    Physical Exam: Blood pressure 158/68, pulse 73, temperature 97.2 F (36.2 C), resp. rate 17, height _0  (1.626 m), weight 148 lb (67.1 kg), SpO2 98 %. General: Well developed, well nourished female in no acute distress. Head: Normocephalic, atraumatic, sclera non-icteric, no xanthomas, nares are without discharge. EENT: normal  Lymph Nodes:  none Neck: Negative for carotid bruits. JVD not elevated. Back:without scoliosis kyphosis Chest  S/p L maste4ctomy Lungs: Clear bilaterally to auscultation without wheezes, rales, or rhonchi. Breathing is unlabored. Heart: RRR with S1S2 mechanical S2 with 2/6  murmur . No rubs, or gallops appreciated. Abdomen: Soft, tender WITH REBOUND  o obvious abdominal masses. Msk:  Strength and tone appear normal for age. Extremities: No clubbing or cyanosis. No  edema.  Distal pedal pulses are 2+ and equal bilaterally. Skin: Warm and Dry Neuro: Alert and oriented X 3. CN III-XII intact Grossly normal sensory and motor function . Psych:  Responds to questions appropriately with a normal affect.      Labs: Cardiac Enzymes No results for input(s): CKTOTAL, CKMB, TROPONINI in the last 72 hours. CBC Lab Results  Component Value Date   WBC 10.8 (H) 09/19/2016   HGB 11.6 (L) 09/19/2016   HCT 35.2 (L) 09/19/2016   MCV 102.0 (H) 09/19/2016   PLT 160 09/19/2016   PROTIME:  Recent Labs  09/19/16 0550  LABPROT 17.6*  INR 1.43   Chemistry  Recent Labs Lab 09/19/16 0550  NA 135  K 3.5  CL 98*  CO2 28  BUN 18  CREATININE 1.55*  CALCIUM 9.8  PROT 6.4*  BILITOT 0.8  ALKPHOS 47  ALT 7*    AST 36  GLUCOSE 120*   Lipids Lab Results  Component Value Date  CHOL 145 09/30/2011   HDL 77 09/30/2011   LDLCALC 60 09/30/2011   TRIG 42 09/30/2011   BNP No results found for: PROBNP Thyroid Function Tests: No results for input(s): TSH, T4TOTAL, T3FREE, THYROIDAB in the last 72 hours.  Invalid input(s): FREET3 Miscellaneous Lab Results  Component Value Date   DDIMER 0.67 (H) 09/30/2011    Radiology/Studies:  Ct Head Wo Contrast  Result Date: 08/28/2016 CLINICAL DATA:  Fall on Coumadin. Headache and neck pain. No loss of consciousness. History of frequent falls due to Parkinson's disease. EXAM: CT HEAD WITHOUT CONTRAST CT CERVICAL SPINE WITHOUT CONTRAST TECHNIQUE: Multidetector CT imaging of the head and cervical spine was performed following the standard protocol without intravenous contrast. Multiplanar CT image reconstructions of the cervical spine were also generated. COMPARISON:  MRI cervical spine 06/03/2016. Cervical spine radiographs 05/17/2016. FINDINGS: CT HEAD FINDINGS Diffuse cerebral atrophy. Ventricular dilatation consistent with central atrophy. Low-attenuation changes in the deep white matter consistent small vessel ischemia. No mass effect or midline shift. No abnormal extra-axial fluid collections. Gray-white matter junctions are distinct. Basal cisterns are not effaced. No evidence of acute intracranial hemorrhage. No depressed skull fractures. Visualized paranasal sinuses and mastoid air cells are not opacified. Vascular calcifications. CT CERVICAL SPINE FINDINGS There is reversal of the usual cervical lordosis. This may be due to patient positioning but ligamentous injury or muscle spasm can also have this appearance and are not excluded. Degenerative changes in the lower cervical region with narrowed interspaces and endplate hypertrophic changes. No vertebral compression deformities. No prevertebral soft tissue swelling. Normal alignment of the facet joints. No  focal bone lesions or bone destruction. C1-2 articulation appears intact. Congenital nonunion of the posterior arch of C1. Vascular calcifications in the cervical carotid arteries. IMPRESSION: No acute intracranial abnormalities. Chronic atrophy and small vessel ischemic changes. Nonspecific reversal of the usual cervical lordosis. Mild degenerative changes in the lower cervical spine. No acute displaced fractures are identified. Electronically Signed   By: Lucienne Capers M.D.   On: 08/28/2016 20:06   Ct Cervical Spine Wo Contrast  Result Date: 08/28/2016 CLINICAL DATA:  Fall on Coumadin. Headache and neck pain. No loss of consciousness. History of frequent falls due to Parkinson's disease. EXAM: CT HEAD WITHOUT CONTRAST CT CERVICAL SPINE WITHOUT CONTRAST TECHNIQUE: Multidetector CT imaging of the head and cervical spine was performed following the standard protocol without intravenous contrast. Multiplanar CT image reconstructions of the cervical spine were also generated. COMPARISON:  MRI cervical spine 06/03/2016. Cervical spine radiographs 05/17/2016. FINDINGS: CT HEAD FINDINGS Diffuse cerebral atrophy. Ventricular dilatation consistent with central atrophy. Low-attenuation changes in the deep white matter consistent small vessel ischemia. No mass effect or midline shift. No abnormal extra-axial fluid collections. Gray-white matter junctions are distinct. Basal cisterns are not effaced. No evidence of acute intracranial hemorrhage. No depressed skull fractures. Visualized paranasal sinuses and mastoid air cells are not opacified. Vascular calcifications. CT CERVICAL SPINE FINDINGS There is reversal of the usual cervical lordosis. This may be due to patient positioning but ligamentous injury or muscle spasm can also have this appearance and are not excluded. Degenerative changes in the lower cervical region with narrowed interspaces and endplate hypertrophic changes. No vertebral compression deformities. No  prevertebral soft tissue swelling. Normal alignment of the facet joints. No focal bone lesions or bone destruction. C1-2 articulation appears intact. Congenital nonunion of the posterior arch of C1. Vascular calcifications in the cervical carotid arteries. IMPRESSION: No acute intracranial abnormalities. Chronic atrophy and small vessel ischemic changes.  Nonspecific reversal of the usual cervical lordosis. Mild degenerative changes in the lower cervical spine. No acute displaced fractures are identified. Electronically Signed   By: Lucienne Capers M.D.   On: 08/28/2016 20:06   Dg Shoulder Left  Result Date: 08/28/2016 CLINICAL DATA:  Fall in kitchen today. Left shoulder injury and pain. Initial encounter. EXAM: LEFT SHOULDER - 2+ VIEW COMPARISON:  None. FINDINGS: There is no evidence of fracture or dislocation. There is no evidence of arthropathy or other focal bone abnormality. Soft tissues are unremarkable. IMPRESSION: Negative. Electronically Signed   By: Earle Gell M.D.   On: 08/28/2016 19:52   Dg Abdomen Acute W/chest  Result Date: 09/19/2016 CLINICAL DATA:  Chest pressure starting around 7 p.m. Nausea and vomiting. Shortness of breath. Abdominal pain ever since stent placement for kidney stones on Friday. EXAM: DG ABDOMEN ACUTE W/ 1V CHEST COMPARISON:  Abdominal fluoroscopy 09/17/2016. Abdomen 08/17/2016. Chest 10/03/2014. FINDINGS: Postoperative changes in the mediastinum. Normal heart size and pulmonary vascularity. Slight fibrosis or linear atelectasis in the left lung base. No focal airspace disease or consolidation in the lungs. No blunting of costophrenic angles. No pneumothorax. Esophageal hiatal hernia behind the heart. Scattered gas and stool throughout the colon. No small or large bowel distention. No free intra-abdominal air. No abnormal air-fluid levels. A right ureteral stent is in place with 2 calcifications adjacent to the distal stent likely representing ureteral stones. Small  calcifications projected over the mid and lower pole left kidney consistent with intrarenal stones. Vascular calcifications. Lumbar scoliosis convex towards the left. IMPRESSION: No evidence of active pulmonary disease. Esophageal hiatal hernia behind the heart. Nonobstructive bowel gas pattern. Left renal stones. Right ureteral stent with probable right ureteral stones. Electronically Signed   By: Lucienne Capers M.D.   On: 09/19/2016 06:39    EKG: sionus 59 16/12/44 LBBB   Tn 0.09   Assessment and Plan:  AFib RVR  + troponin  Sinus bradycardia  AVR- mechanical    Nephrolithniasis with recenbt procedure  Nausea and Abdominal pain    The pt has tachybrady syndrome which will need to be addressed later, with pacing almost certainly, and her AFIB in this situation is presumably but not relevantly triggered by her GU procedure and stomach issues  It is now resolved and apart from continuing bridging there is nothing specific to do  Her + Tn is prob nonspecific;  Her GI pain is aggravated by compression and while she has a potentially masking ECG with LBBB ( old ) I dont think this is the issue  Still would get serilally troponin and will follow with you  Her   Virl Axe

## 2016-09-19 NOTE — ED Triage Notes (Signed)
Pt comes via [facility], c/o chest pressure that started around 7pm, central, n/v, and SOB, stats were 90% initially, placed on 4L., improved to 96. CP 4/10 abd pain 8/10. PTA received 1 nitro, 4 mg zofran

## 2016-09-19 NOTE — ED Notes (Signed)
Admitting at bedside 

## 2016-09-19 NOTE — Progress Notes (Signed)
CRITICAL VALUE ALERT  Critical value received:  Troponin 0.37  Date of notification:  09/19/2016  Time of notification:  6282  Critical value read back:Yes.    Nurse who received alert:  Clint Lipps  MD notified (1st page):  Dr. Heber [place]  Time of first page:  [phone]  MD notified (2nd page):  Time of second page:  Responding MD:  Dr. Heber   Time MD responded:  [phone]

## 2016-09-19 NOTE — ED Notes (Signed)
Patient transported to X-ray 

## 2016-09-19 NOTE — ED Provider Notes (Addendum)
[facility] DEPT Provider Note   CSN: [id number] Arrival date & time: 09/19/16  0539     History   Chief Complaint Chief Complaint  Patient presents with  . Chest Pain    HPI [name] is a 75 y.o. female.  HPI  This is a 75 year old female with a history of congestive heart failure, chronic kidney disease, atrial fibrillation, aortic valve replacement who is on chronic anticoagulation who presents with palpitations and chest pain. Patient reports that approximately 3 AM she had onset of palpitations and chest tightness. She also reports nausea. She recently had kidney stone surgery and is being bridged back to Coumadin with Lovenox. She feels like her symptoms are consistent with A. fib. She was found by EMS to be a heart rate 170s. She was given nitroglycerin and aspirin. Initially, she was brought in as a code Ryan EMI. His was called off by cardiology after they reviewed her EKG. Upon arrival here, she is now in sinus rhythm. She reports that she feels somewhat better and is no longer having palpitations but "I just feel crummy." She reports persistent nausea. She reports that she's had nausea since her surgery.  Past Medical History:  Diagnosis Date  . Anticoagulated on Coumadin   . Arthropathy of cervical facet joint (HCC)    C2-3  . At risk for falls   . BPV (benign positional vertigo)   . Bruises easily   . Cancer ([place]) 2004    breast-lumpectomy,nodes ,radiation,chemo  . CHF (congestive heart failure) ([place])    per last echo 10-24-2014 ef 40-45%  . Chronic neck pain   . CKD (chronic kidney disease), stage II   . Diverticulosis of colon   . GERD (gastroesophageal reflux disease)   . Hiatal hernia   . History of aortic valve stenosis   . History of breast cancer per pt no recurrence   dx 2004-- Left breast DCIS (ER/PR+, HER2 negative) s/p  partial masectomy w/ sln bx and  chemoradiotion (completed 2004)  . History of colon polyps   . History of herpes zoster     . History of kidney stones   . History of kidney stones   . HTN (hypertension)   . Hyperlipidemia   . Irritable bowel syndrome   . LBBB (left bundle branch block)   . PAF (paroxysmal atrial fibrillation) [facility])    cardiologist-  dr Martinique  . Parkinsonism [facility])    neurologist-  dr tat  . PONV (postoperative nausea and vomiting)   . PVC's (premature ventricular contractions)   . Right ureteral stone   . S/P aortic valve replacement with prosthetic valve    04-13-2006  St Jude mechnical  for severe stenosis  . SUI (stress urinary incontinence, female)   . Unstable balance    uses cane  . Venous varices      Patient Active Problem List   Diagnosis Date Noted  . Sinus bradycardia 09/16/2016  . Family history of colon cancer 07/30/2014  . Hiatal hernia 07/30/2014  . Chest pain 10/13/2011  . Disequilibrium 10/13/2011  . Heart valve replaced by other means 04/16/2011  . Anticoagulant long-term use 03/17/2011  . S/P AVR (aortic valve replacement)   . PVC's (premature ventricular contractions)   . HTN (hypertension)   . Hypercholesteremia   . A-fib ([place])   . GERD (gastroesophageal reflux disease)   . GERD 02/06/2010  . DIVERTICULOSIS-COLON 02/06/2010  . PERSONAL HX COLONIC POLYPS 02/06/2010  . MASTECTOMY, HX OF 02/06/2010  Past Surgical History:  Procedure Laterality Date  . ANTERIOR AND POSTERIOR REPAIR  1990   cystocele/ rectocele  . AORTIC VALVE REPLACEMENT  04/13/06   dr gerhardt   and Replacement Ascending Aorta (St. Jude mechanical prosthesis)  . APPENDECTOMY  child 1950  . BREAST SURGERY Left 2004  . CARDIAC CATHETERIZATION  02-22-2001   dr Martinique   minimal non-obstructive cad/  mild aortic stenosis and aortic root enlargement/  normal LVF, ef 65%  . CARDIAC CATHETERIZATION  03-22-2006    dr Martinique   no significant obstructive cad/  severe aortic stenosis and mild to moderate aortic root enlargement/  upper normal right heart pressures  . CARDIOVASCULAR STRESS  TEST  09-30-2011   dr Martinique   lexiscan nuclear study w/ apical thinning  vs  small prior apical infarct w/ no significant ischemia/  hypokinesis of the distal septum and apex, ef 50%  . CARPAL TUNNEL RELEASE Bilateral left 09-09-2004/  right 2007  . CATARACT EXTRACTION W/ INTRAOCULAR LENS  IMPLANT, BILATERAL  2015  . COLONOSCOPY N/A 10/03/2014   Procedure: COLONOSCOPY;  Surgeon: [name], MD;  Location: [facility];  Service: Endoscopy;  Laterality: N/A;  . ESOPHAGEAL MANOMETRY N/A 08/12/2014   Procedure: ESOPHAGEAL MANOMETRY (EM);  Surgeon: [name], MD;  Location: [facility] ENDOSCOPY;  Service: Endoscopy;  Laterality: N/A;  . ESOPHAGOGASTRODUODENOSCOPY N/A 10/03/2014   Procedure: ESOPHAGOGASTRODUODENOSCOPY (EGD);  Surgeon: [name], MD;  Location: [facility] ENDOSCOPY;  Service: Endoscopy;  Laterality: N/A;  . [place]  . FOOT SURGERY Right 2013   hammertoe x2  . PERCUTANEOUS NEPHROSTOLITHOTOMY  1993  . TOTAL ABDOMINAL HYSTERECTOMY  1971   w/ Bilateral Salpingoophorectomy  . TRANSTHORACIC ECHOCARDIOGRAM  10/24/2014   dr Martinique   hypokinesis of the basal and mid inferoseptal and inferior walls,  ef 40-45%,  grade 1 diastolic dysfunction/  mechanical bileaflet aortic valve noted w/ mild regur., peak grandient 82mHg, valve area 1.35cm^2/  severe MV calcification without stenosis w/ mild regurg., peak gradient 370mg/  mild LAE/  poss. atrial mass (MRI showed benign lipomatous hypertrophy of atrial septum) /tFrazier ButtR/mild TR    OB History    No data available       Home Medications    Prior to Admission medications   Medication Sig Start Date End Date Taking? Authorizing Provider  calcium citrate-vitamin D (CITRACAL+D) 315-200 MG-UNIT per tablet Take 1 tablet by mouth 3 (three) times daily.   Yes Historical Provider, MD  carbidopa-levodopa (SINEMET IR) 25-100 MG tablet Take 2 tablets by mouth 3 (three) times daily. 07/06/16  Yes Rebecca S Tat, DO   Cholecalciferol (VITAMIN D3) 2000 units TABS Take 1 capsule by mouth daily.   Yes Historical Provider, MD  enoxaparin (LOVENOX) 80 MG/0.8ML injection Inject 80 mg into the skin 2 (two) times daily.  09/10/16  Yes Historical Provider, MD  famotidine-calcium carbonate-magnesium hydroxide (PEPCID COMPLETE) 10-800-165 MG CHEW chewable tablet Chew 1 tablet by mouth daily as needed (heartburn).   Yes Historical Provider, MD  Multiple Vitamin (MULTIVITAMIN) tablet Take 1 tablet by mouth daily at 12 noon.    Yes Historical Provider, MD  Multiple Vitamins-Minerals (VISION-VITE PRESERVE PO) Take 1 tablet by mouth 2 (two) times daily.   Yes Historical Provider, MD  oxybutynin (DITROPAN) 5 MG tablet Take 2.5 mg by mouth 2 (two) times daily.    Yes Historical Provider, MD  pantoprazole (PROTONIX) 40 MG tablet Take 40 mg by mouth every morning.  Yes Historical Provider, MD  phenazopyridine (PYRIDIUM) 200 MG tablet Take 1 tablet (200 mg total) by mouth 3 (three) times daily as needed for pain. 09/17/16  Yes [name], MD  simvastatin (ZOCOR) 20 MG tablet Take 20 mg by mouth every evening.   Yes Historical Provider, MD  triamterene-hydrochlorothiazide (MAXZIDE-25) 37.5-25 MG per tablet Take 1 tablet by mouth every morning.    Yes Historical Provider, MD  UNABLE TO FIND Take 900 mg by mouth daily at 12 noon. Omega Red    Yes Historical Provider, MD  vitamin B-12 (CYANOCOBALAMIN) 1000 MCG tablet Take 1,000 mcg by mouth every morning.   Yes Historical Provider, MD  warfarin (COUMADIN) 5 MG tablet Take 5 mg by mouth every evening. 7.5 mg every Tues, Thurs, Sat; 5 mg all other days   Yes Historical Provider, MD    Family History Family History  Problem Relation Age of Onset  . Heart disease Mother   . Heart disease Father 51    CABG  . Other Brother     AVR    Social History Social History  Substance Use Topics  . Smoking status: Never Smoker  . Smokeless tobacco: Never Used  . Alcohol use No      Allergies   Demerol [meperidine]; Oxycodone; Adhesive [tape]; Ivp dye [iodinated diagnostic agents]; and Phenergan [promethazine]   Review of Systems Review of Systems  Constitutional: Negative for fever.  Respiratory: Negative for shortness of breath.   Cardiovascular: Positive for chest pain and palpitations. Negative for leg swelling.  Gastrointestinal: Positive for nausea and vomiting. Negative for abdominal pain and diarrhea.  Genitourinary: Negative for dysuria and hematuria.  Neurological: Negative for dizziness.  All other systems reviewed and are negative.    Physical Exam Updated Vital Signs BP 137/70   Pulse 60   Temp 97.2 F (36.2 C)   Resp 15   Ht _0  (1.626 m)   Wt 148 lb (67.1 kg)   SpO2 99%   BMI 25.40 kg/m   Physical Exam  Constitutional: She is oriented to person, place, and time. No distress.  Elderly, ill-appearing, no acute distress  HENT:  Head: Normocephalic and atraumatic.  Cardiovascular: Normal rate and regular rhythm.  Exam reveals no friction rub.   Murmur heard. Pulmonary/Chest: Effort normal and breath sounds normal. No respiratory distress. She has no wheezes.  Abdominal: Soft. Bowel sounds are normal. There is no tenderness. There is no guarding.  Bruising over the mid lower abdomen consistent with Lovenox shots  Musculoskeletal: She exhibits no edema.  Neurological: She is alert and oriented to person, place, and time.  Skin: Skin is warm and dry.  Psychiatric: She has a normal mood and affect.  Nursing note and vitals reviewed.    ED Treatments / Results  Labs (all labs ordered are listed, but only abnormal results are displayed) Labs Reviewed  I-STAT TROPOININ, ED - Abnormal; Notable for the following:       Result Value   Troponin i, poc 0.09 (*)    All other components within normal limits  CBC WITH DIFFERENTIAL/PLATELET  COMPREHENSIVE METABOLIC PANEL  LIPASE, BLOOD  PROTIME-INR    EKG  EKG  Interpretation  Date/Time:  Sunday September 19 2016 05:44:53 EDT Ventricular Rate:  59 PR Interval:    QRS Duration: 126 QT Interval:  449 QTC Calculation: 445 R Axis:   82 Text Interpretation:  Sinus rhythm Left bundle branch block No significant change since last tracing Confirmed by Sharron Simpson  MD, Loma Sousa ([id number]) on 09/19/2016 6:20:53 AM       Radiology No results found.  Procedures Procedures (including critical care time)  Medications Ordered in ED Medications  ondansetron (ZOFRAN) injection 4 mg (4 mg Intravenous Given 09/19/16 0553)     Initial Impression / Assessment and Plan / ED Course  I have reviewed the triage vital signs and the nursing notes.  Pertinent labs & imaging results that were available during my care of the patient were reviewed by me and considered in my medical decision making (see chart for details).  Clinical Course    Patient presents initially as a code STEMI. Was in A. fib with RVR in EMS. Now in sinus rhythm. No acute ischemia noted on EKG. She now is more comfortable but reports persistent nausea. Troponin 0.09. Cardiology fellow is evaluating at the bedside. Cath Lab was canceled. Anticipate she will be admitted given symptoms.Patient has had persistent nausea while in the emergency room. She also reports upper abdominal pain. History of a hiatal hernia. Her lab work is largely reassuring.  I discussed the patient with Dr. Caryl Comes, cardiology. They will evaluate the patient. Given persistent nausea and abdominal pain and recent kidney stone stenting, will admit to medicine for further management.  Patient will be admitted to the medicine teaching service. Urinalysis added. Do not feel she needs further imaging at this time and we'll treat symptomatically. No evidence of obstruction on abdominal series.  Final Clinical Impressions(s) / ED Diagnoses   Final diagnoses:  Atrial fibrillation with RVR (HCC)  Non-intractable vomiting with nausea,  vomiting of unspecified type  Elevated troponin    New Prescriptions New Prescriptions   No medications on file     Merryl Hacker, MD 09/19/16 [place], MD 09/19/16 [phone]

## 2016-09-19 NOTE — [place] Notes (Signed)
Pharmacy Antibiotic Note  [name] is a 75 y.o. female admitted on 09/19/2016 with UTI.  Pharmacy has been consulted for ceftriaxone dosing. Patient is currently afebrile with elevated wbc. Her urine analysis showed few bacteria, small number of leukocytes, and positive for nitrite. Urine culture is pending.  Plan: Ceftriaxone 1g IV q24h Monitor CBC and s/sx's of infection as needed Follow-up urine culture as results posted   Height: 5' (152.4 cm) Weight: 138 lb 14.4 oz (63 kg) IBW/kg (Calculated) : 45.5  Temp (24hrs), Avg:98 F (36.7 C), Min:97.2 F (36.2 C), Max:98.7 F (37.1 C)   Recent Labs Lab 09/19/16 0550  WBC 10.8*  CREATININE 1.55*    Estimated Creatinine Clearance: 25.6 mL/min (by C-G formula based on SCr of 1.55 mg/dL (H)).    Allergies  Allergen Reactions  . Demerol [Meperidine] Shortness Of Breath and Swelling  . Oxycodone Other (See Comments)    Hallucinations   . Adhesive [Tape] Other (See Comments)    Redness and skin tears  . Ivp Dye [Iodinated Diagnostic Agents] Hives    OK with benadryl pre-med (50mg  one hour before receiving iodinated contrast agent)  . Phenergan [Promethazine] Other (See Comments)    Avoids due to reaction with current medications    Antimicrobials this admission: Ceftriaxone 9/24 >>  Dose adjustments this admission: none  Microbiology results: 9/24 UCx: pending   Since ceftriaxone does not require renal dosage adjustments, pharmacy will sign off now but continue to follow remotely. Thank you for allowing pharmacy to be a part of this patient's care.  Belia Heman, PharmD PGY1 Pharmacy Resident [phone] (Pager) 09/19/2016 12:22 PM

## 2016-09-19 NOTE — [place] Notes (Addendum)
ANTICOAGULATION CONSULT NOTE - Initial Consult  Pharmacy Consult for Enoxaparin-Warfarin Bridge Indication: AV replacement, chronic anticoagulation   Assessment: 66 yoF admitted on 9/24 for abdominal pain, N/V, and CP with palpitations s/p 2 days post-op of R urteral calculus removal without complications. Pharmacy is consulted to dose and monitor enoxaparin and warfarin. Prior to her recent operation, she was chronically anticoagulated with warfarin for atrial fibrillation and mechanical atrial valve replacement (2007). Currently, she is being bridged with enoxaparin until warfarin is therapeutic. Her PTA regimen was enoxaparin 22m [place] bid and warfarin 7.52mTuThSa, 51m251mWFSu. On admission, her INR was 1.43, Scr 1.55 (CrCl ~25), hemoglobin slightly low, and platelet count wnl. Patient endorses blood in the urine, which is most likely due to the recent surgery. We will continue to monitor.   Given her decrease in renal function and decrease in weight, her enoxaparin dose will be reduced to 651m751m every 24 hours, starting tomorrow since she received one dose of 651mg451mthe ED this morning at 0830.   Since her INR is subtherapeutic and her last dose of warfarin was 7.51mg l351m night, her warfarin dose will be increased from her pta dose to 7.5 mg tonight.   Goal of Therapy:  INR 2.5-3.5 (mechanical AV replacement) Monitor platelets by anticoagulation protocol: Yes   Plan:  Enoxaparin 651mg s72m4h, starting tomorrow morning Warfarin 7.51mg x 1451mse tonight Monitor daily INR, CBC, and s/sx's bleeding   Allergies  Allergen Reactions  . Demerol [Meperidine] Shortness Of Breath and Swelling  . Oxycodone Other (See Comments)    Hallucinations   . Adhesive [Tape] Other (See Comments)    Redness and skin tears  . Ivp Dye [Iodinated Diagnostic Agents] Hives    OK with benadryl pre-med (50mg one451mr before receiving iodinated contrast agent)  . Phenergan [Promethazine] Other (See Comments)   Avoids due to reaction with current medications    Patient Measurements: Height: 5' (152.4 cm) Weight: 138 lb 14.4 oz (63 kg) IBW/kg (Calculated) : 45.5  Vital Signs: Temp: 98.7 F (37.1 C) (09/24 1019) Temp Source: Oral (09/24 1019) BP: 130/63 (09/24 1019) Pulse Rate: 68 (09/24 1019)  Labs:  Recent Labs  09/19/16 0550  HGB 11.6*  HCT 35.2*  PLT 160  LABPROT 17.6*  INR 1.43  CREATININE 1.55*    Estimated Creatinine Clearance: 25.6 mL/min (by C-G formula based on SCr of 1.55 mg/dL (H)).   Medical History: Past Medical History:  Diagnosis Date  . Anticoagulated on Coumadin   . Arthropathy of cervical facet joint (HCC)    C2-3  . At risk for falls   . BPV (benign positional vertigo)   . Bruises easily   . Cancer (H[facility]) [place]-lumpectomy,nodes ,radiation,chemo  . CHF (congestive heart failure) (HCC)    [place] 40[place]45[place]  . Chronic neck pain   . CKD (chronic kidney disease), stage II   . Diverticulosis of colon   . GERD (gastroesophageal reflux disease)   . Hiatal hernia   . History of aortic valve stenosis   . History of breast cancer per pt no recurrence   dx 2004-- Left breast DCIS (ER/PR+, HER2 negative) s/p  partial masectomy w/ sln bx and  chemoradiotion (completed 2004)  . History of colon polyps   . History of herpes zoster   . History of kidney stones   . History of kidney stones   . HTN (hypertension)   . Hyperlipidemia   .  Irritable bowel syndrome   . LBBB (left bundle branch block)   . PAF (paroxysmal atrial fibrillation) [facility])    cardiologist-  dr Martinique  . Parkinsonism [facility])    neurologist-  dr tat  . PONV (postoperative nausea and vomiting)   . PVC's (premature ventricular contractions)   . Right ureteral stone   . S/P aortic valve replacement with prosthetic valve    04-13-2006  St Jude mechnical  for severe stenosis  . SUI (stress urinary incontinence, female)   . Unstable balance    uses cane  . Venous  varices      Medications:  Scheduled:  . carbidopa-levodopa  2 tablet Oral TID  . [START ON 09/20/2016] enoxaparin (LOVENOX) injection  1 mg/kg Subcutaneous Q24H  . oxybutynin  2.5 mg Oral BID  . pantoprazole  40 mg Oral Daily  . polyethylene glycol  17 g Oral Daily  . senna  1 tablet Oral BID  . simvastatin  20 mg Oral QPM  . sodium chloride flush  3 mL Intravenous Q12H  . triamterene-hydrochlorothiazide  1 tablet Oral Daily  . warfarin  7.5 mg Oral ONCE-1800  . Warfarin - Pharmacist Dosing Inpatient   Does not apply [id number]    Belia Heman, PharmD PGY1 Pharmacy Resident [phone] (Pager) 09/19/2016 11:42 AM

## 2016-09-19 NOTE — [place] Notes (Signed)
Spoke with MD on call regarding U/A results from this AM. Will continue to monitor.

## 2016-09-19 NOTE — H&P (Signed)
Date: 09/19/2016               Patient Name:  [name] MRN: [id number]  DOB: 03-Feb-1941 Age / Sex: 75 y.o., female   PCP: Leanna Battles, MD         Medical Service: Internal Medicine Teaching Service         Attending Physician: Dr. Annia Belt, MD    First Contact: Dr. Heber Solen Pager: [phone]  Second Contact: Dr. Quay Burow Pager: [phone]       After Hours (After 5p/  First Contact Pager: [phone]  weekends / holidays): Second Contact Pager: [phone]   Chief Complaint:  History of Present Illness: Mr. Baxley is a 75 year old female with PMHx of aortic valve replacement (2007), paroxysmal atrial fibrillation, hypertension and breast cancer 2004 now in remission (ER/PR positive, HER-2 negative) S/P partial mastectomy, chemoradiation that presents with stomach pain, nausea and vomiting.    Patient felt well after her surgery on Friday.  She had right ureteral calculus removal without complications. She is scheduled for stent removal in one week with urology. Yesterday, 9/23, patient developed abdominal pain associated with nausea and non-bloody emesis which started overnight. She has vomited about 8-10 times since this morning. Patient tried to eat drink water at home which resulted in recurrent emesis. Abdominal pain is located in her bilateral lower quadrants, stabbing, and a 10/10. Pain has been getting progressively worse since Saturday and is non-radiating. Nothing seems to make it better or worse. She has not had a bowel movement since Thursday or passed gas since that time. Patient has her gallbladder but not her appendix.  And other abdominal surgery includes hysterectomy at age 73.   Patient has a hiatal hernia which is associated with indigestion and burning for which she takes pepcid and protonix as needed. The nausea and vomiting seems similar to these presentations, but the abdominal pain is new.   This morning, patient also noted palpitations and admits she has a  history of atrial fibrillation with RVR. On 9/21 she saw her cardiologist and metoprolol was discontinued due to bradycardia. Patient had been on metoprolol for about one year. Her heart rate would drop in to the 30-40s.  This morning she had left sided chest pain described as heaviness, 3-4/10, that lasted for one hour which is now relieved  Currently, patient admits to nausea, mild headache, some shortness of breath,, hematuria, dysuria. She denies fever, chills, sore throat, cough, leg swelling.    Meds:  Current Meds  Medication Sig  . calcium citrate-vitamin D (CITRACAL+D) 315-200 MG-UNIT per tablet Take 1 tablet by mouth 3 (three) times daily.  . carbidopa-levodopa (SINEMET IR) 25-100 MG tablet Take 2 tablets by mouth 3 (three) times daily.  . Cholecalciferol (VITAMIN D3) 2000 units TABS Take 1 capsule by mouth daily.  Marland Kitchen enoxaparin (LOVENOX) 80 MG/0.8ML injection Inject 80 mg into the skin 2 (two) times daily.   . famotidine-calcium carbonate-magnesium hydroxide (PEPCID COMPLETE) 10-800-165 MG CHEW chewable tablet Chew 1 tablet by mouth daily as needed (heartburn).  . Multiple Vitamin (MULTIVITAMIN) tablet Take 1 tablet by mouth daily at 12 noon.   . Multiple Vitamins-Minerals (VISION-VITE PRESERVE PO) Take 1 tablet by mouth 2 (two) times daily.  Marland Kitchen oxybutynin (DITROPAN) 5 MG tablet Take 2.5 mg by mouth 2 (two) times daily.   . pantoprazole (PROTONIX) 40 MG tablet Take 40 mg by mouth every morning.   . phenazopyridine (PYRIDIUM) 200 MG tablet Take 1  tablet (200 mg total) by mouth 3 (three) times daily as needed for pain.  . simvastatin (ZOCOR) 20 MG tablet Take 20 mg by mouth every evening.  . triamterene-hydrochlorothiazide (MAXZIDE-25) 37.5-25 MG per tablet Take 1 tablet by mouth every morning.   Marland Kitchen UNABLE TO FIND Take 900 mg by mouth daily at 12 noon. Omega Red   . vitamin B-12 (CYANOCOBALAMIN) 1000 MCG tablet Take 1,000 mcg by mouth every morning.  . warfarin (COUMADIN) 5 MG tablet Take  5 mg by mouth every evening. 7.5 mg every Tues, Thurs, Sat; 5 mg all other days     Allergies: Allergies as of 09/19/2016 - Review Complete 09/19/2016  Allergen Reaction Noted  . Demerol [meperidine] Shortness Of Breath and Swelling 09/14/2016  . Oxycodone Other (See Comments) 03/18/2015  . Adhesive [tape] Other (See Comments) 08/28/2016  . Ivp dye [iodinated diagnostic agents] Hives 10/13/2011  . Phenergan [promethazine] Other (See Comments) 09/14/2016   Past Medical History:  Diagnosis Date  . Anticoagulated on Coumadin   . Arthropathy of cervical facet joint (HCC)    C2-3  . At risk for falls   . BPV (benign positional vertigo)   . Bruises easily   . Cancer ([place]) 2004    breast-lumpectomy,nodes ,radiation,chemo  . CHF (congestive heart failure) ([place])    per last echo 10-24-2014 ef 40-45%  . Chronic neck pain   . CKD (chronic kidney disease), stage II   . Diverticulosis of colon   . GERD (gastroesophageal reflux disease)   . Hiatal hernia   . History of aortic valve stenosis   . History of breast cancer per pt no recurrence   dx 2004-- Left breast DCIS (ER/PR+, HER2 negative) s/p  partial masectomy w/ sln bx and  chemoradiotion (completed 2004)  . History of colon polyps   . History of herpes zoster   . History of kidney stones   . History of kidney stones   . HTN (hypertension)   . Hyperlipidemia   . Irritable bowel syndrome   . LBBB (left bundle branch block)   . PAF (paroxysmal atrial fibrillation) [facility])    cardiologist-  dr [place]  . [facility])    neurologist-  dr tat  . PONV (postoperative nausea and vomiting)   . PVC's (premature ventricular contractions)   . Right ureteral stone   . S/P aortic valve replacement with prosthetic valve    04-13-2006  St Jude mechnical  for severe stenosis  . SUI (stress urinary incontinence, female)   . Unstable balance    uses cane  . Venous varices      Family History: Mother: CAD Father: CAD  Social  History:  Denies alcohol, tobacco or illicit drug use. Patient lives with her husband in a house. 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   Surgical History: Hysterectomy Appendectomy AV Replacement in 2007 Partial Mastectomy 2004  Review of Systems: A complete ROS was negative except as per HPI.   Physical Exam: Vitals:   09/19/16 0745 09/19/16 0800 09/19/16 0815 09/19/16 0845  BP: 154/67 143/56 154/70 140/61  Pulse: 70 72 (!) 59 72  Resp: 19 17 20 16   Temp:      SpO2: 98% 100% 99% 98%  Weight:      Height:       General: Vital signs reviewed.  Patient is well-developed and well-nourished, in no acute distress and cooperative with exam.  Head: Normocephalic and atraumatic. Eyes: EOMI, conjunctivae normal, no  scleral icterus.  Neck: Supple, trachea midline, normal ROM Cardiovascular: RRR, systolic click. Pulmonary/Chest: Clear to auscultation bilaterally, no wheezes, rales, or rhonchi. Abdominal: Soft, tender to palpation to lower quadrants bilaterally, non-distended, BS +, no masses, organomegaly, or guarding present.  Musculoskeletal: No joint deformities, erythema, or stiffness, ROM full and nontender. Extremities: No lower extremity edema bilaterally,  pulses symmetric and intact bilaterally. No cyanosis or clubbing. Neurological: A&O x3, Strength is normal and symmetric bilaterally, no focal motor deficit, sensory intact to light touch bilaterally.  Skin: Warm, dry and intact. No rashes or erythema. Psychiatric: Normal mood and affect. speech and behavior is normal. Cognition and memory are normal.   EKG: Normal Sinus Rhythm with no T wave inversions or ST elevation  DG Abdomen acute w/chest IMPRESSION: No evidence of active pulmonary disease. Esophageal hiatal hernia behind the heart.  Nonobstructive bowel gas pattern. Left renal stones. Right ureteral stent with probable right ureteral stones.  Assessment & Plan by  Problem:  Abdominal pain She states that she felt well on Friday after the urethral stent procedure. Patient has had N/V and abdominal pain since 2am this morning.  She denies hemoptysis.  She has not had a bowel movement or passed gas since Thursday.  On exam she had normoactive bowel sounds and x-ray of abdomen shows nonobstructive bowel gas pattern.  She reports blood in her urine. This may be common after her procedure on Friday. - urine analysis - Urine culture - zofran - pepcid   Atrial Fibrillation Currently in sinus rhythm.  Patient stated earlier she had a rapid heartbeat.  Her GI symptoms likely sent her into RVR earlier.  She reported feeling a heavy pressure on the left side of her chest and was a 3/10 chest pain.  Patients troponins were elevated in the ED at 0.09, likely from atrial fibrillation with RVR.  Will continue to trend troponins   - Continue warfarin and lovenox bridge - Trend Troponins - Telemetry    Hypertension BP 136/63 - Continue to monitor  Hyperlipidemia -Continue home Simvastatin 80m daily  Dispo: Admit patient to Observation with expected length of stay less than 2 midnights.  Signed: JValinda Party DO 09/19/2016, 8:16 AM  Pager: [phone]

## 2016-09-20 ENCOUNTER — Encounter (HOSPITAL_BASED_OUTPATIENT_CLINIC_OR_DEPARTMENT_OTHER): Payer: Self-pay | Admitting: Urology

## 2016-09-20 DIAGNOSIS — K219 Gastro-esophageal reflux disease without esophagitis: Secondary | ICD-10-CM | POA: Diagnosis not present

## 2016-09-20 DIAGNOSIS — Z9221 Personal history of antineoplastic chemotherapy: Secondary | ICD-10-CM | POA: Diagnosis not present

## 2016-09-20 DIAGNOSIS — R1032 Left lower quadrant pain: Secondary | ICD-10-CM

## 2016-09-20 DIAGNOSIS — G8918 Other acute postprocedural pain: Secondary | ICD-10-CM

## 2016-09-20 DIAGNOSIS — R1031 Right lower quadrant pain: Secondary | ICD-10-CM

## 2016-09-20 DIAGNOSIS — K5900 Constipation, unspecified: Secondary | ICD-10-CM | POA: Diagnosis not present

## 2016-09-20 DIAGNOSIS — K449 Diaphragmatic hernia without obstruction or gangrene: Secondary | ICD-10-CM | POA: Diagnosis not present

## 2016-09-20 DIAGNOSIS — R109 Unspecified abdominal pain: Secondary | ICD-10-CM | POA: Diagnosis not present

## 2016-09-20 DIAGNOSIS — N182 Chronic kidney disease, stage 2 (mild): Secondary | ICD-10-CM | POA: Diagnosis not present

## 2016-09-20 DIAGNOSIS — R112 Nausea with vomiting, unspecified: Secondary | ICD-10-CM | POA: Diagnosis not present

## 2016-09-20 DIAGNOSIS — R079 Chest pain, unspecified: Secondary | ICD-10-CM | POA: Diagnosis not present

## 2016-09-20 DIAGNOSIS — I10 Essential (primary) hypertension: Secondary | ICD-10-CM

## 2016-09-20 DIAGNOSIS — Z923 Personal history of irradiation: Secondary | ICD-10-CM | POA: Diagnosis not present

## 2016-09-20 DIAGNOSIS — Z853 Personal history of malignant neoplasm of breast: Secondary | ICD-10-CM

## 2016-09-20 DIAGNOSIS — I248 Other forms of acute ischemic heart disease: Secondary | ICD-10-CM | POA: Diagnosis not present

## 2016-09-20 DIAGNOSIS — Z7901 Long term (current) use of anticoagulants: Secondary | ICD-10-CM | POA: Diagnosis not present

## 2016-09-20 DIAGNOSIS — B9689 Other specified bacterial agents as the cause of diseases classified elsewhere: Secondary | ICD-10-CM | POA: Diagnosis not present

## 2016-09-20 DIAGNOSIS — K2970 Gastritis, unspecified, without bleeding: Secondary | ICD-10-CM | POA: Diagnosis not present

## 2016-09-20 DIAGNOSIS — N201 Calculus of ureter: Secondary | ICD-10-CM | POA: Diagnosis not present

## 2016-09-20 DIAGNOSIS — I447 Left bundle-branch block, unspecified: Secondary | ICD-10-CM | POA: Diagnosis not present

## 2016-09-20 DIAGNOSIS — N390 Urinary tract infection, site not specified: Secondary | ICD-10-CM | POA: Diagnosis not present

## 2016-09-20 DIAGNOSIS — E785 Hyperlipidemia, unspecified: Secondary | ICD-10-CM

## 2016-09-20 DIAGNOSIS — Z9010 Acquired absence of unspecified breast and nipple: Secondary | ICD-10-CM

## 2016-09-20 DIAGNOSIS — R0789 Other chest pain: Secondary | ICD-10-CM | POA: Diagnosis not present

## 2016-09-20 DIAGNOSIS — Z952 Presence of prosthetic heart valve: Secondary | ICD-10-CM | POA: Diagnosis not present

## 2016-09-20 DIAGNOSIS — Z960 Presence of urogenital implants: Secondary | ICD-10-CM

## 2016-09-20 DIAGNOSIS — R7989 Other specified abnormal findings of blood chemistry: Secondary | ICD-10-CM | POA: Diagnosis not present

## 2016-09-20 DIAGNOSIS — I509 Heart failure, unspecified: Secondary | ICD-10-CM | POA: Diagnosis not present

## 2016-09-20 DIAGNOSIS — I480 Paroxysmal atrial fibrillation: Secondary | ICD-10-CM | POA: Diagnosis not present

## 2016-09-20 DIAGNOSIS — Z79899 Other long term (current) drug therapy: Secondary | ICD-10-CM

## 2016-09-20 DIAGNOSIS — G20 Parkinson's disease: Secondary | ICD-10-CM | POA: Diagnosis not present

## 2016-09-20 DIAGNOSIS — I4891 Unspecified atrial fibrillation: Secondary | ICD-10-CM | POA: Diagnosis not present

## 2016-09-20 DIAGNOSIS — R001 Bradycardia, unspecified: Secondary | ICD-10-CM

## 2016-09-20 DIAGNOSIS — I130 Hypertensive heart and chronic kidney disease with heart failure and stage 1 through stage 4 chronic kidney disease, or unspecified chronic kidney disease: Secondary | ICD-10-CM | POA: Diagnosis not present

## 2016-09-20 DIAGNOSIS — I495 Sick sinus syndrome: Secondary | ICD-10-CM | POA: Diagnosis not present

## 2016-09-20 DIAGNOSIS — R1111 Vomiting without nausea: Secondary | ICD-10-CM | POA: Diagnosis not present

## 2016-09-20 LAB — CBC
HCT: 29.5 % — ABNORMAL LOW (ref 36.0–46.0)
Hemoglobin: 9.7 g/dL — ABNORMAL LOW (ref 12.0–15.0)
MCH: 33.7 pg (ref 26.0–34.0)
MCHC: 32.9 g/dL (ref 30.0–36.0)
MCV: 102.4 fL — ABNORMAL HIGH (ref 78.0–100.0)
Platelets: 142 10*3/uL — ABNORMAL LOW (ref 150–400)
RBC: 2.88 MIL/uL — ABNORMAL LOW (ref 3.87–5.11)
RDW: 13.3 % (ref 11.5–15.5)
WBC: 6.4 10*3/uL (ref 4.0–10.5)

## 2016-09-20 LAB — BASIC METABOLIC PANEL
Anion gap: 6 (ref 5–15)
BUN: 17 mg/dL (ref 6–20)
CO2: 26 mmol/L (ref 22–32)
Calcium: 8.2 mg/dL — ABNORMAL LOW (ref 8.9–10.3)
Chloride: 101 mmol/L (ref 101–111)
Creatinine, Ser: 1.6 mg/dL — ABNORMAL HIGH (ref 0.44–1.00)
GFR calc Af Amer: 35 mL/min — ABNORMAL LOW (ref >60)
GFR calc non Af Amer: 30 mL/min — ABNORMAL LOW (ref >60)
Glucose, Bld: 92 mg/dL (ref 65–99)
Potassium: 3.8 mmol/L (ref 3.5–5.1)
Sodium: 133 mmol/L — ABNORMAL LOW (ref 135–145)

## 2016-09-20 LAB — URINE CULTURE: Culture: 6000 — AB

## 2016-09-20 MED ORDER — WARFARIN SODIUM 7.5 MG PO TABS
7.5000 mg | ORAL_TABLET | Freq: Once | ORAL | Status: AC
  Administered 2016-09-20: 7.5 mg via ORAL
  Filled 2016-09-20: qty 1

## 2016-09-20 MED ORDER — SODIUM CHLORIDE 0.9 % IV SOLN
INTRAVENOUS | Status: DC
  Administered 2016-09-20: 07:00:00 via INTRAVENOUS

## 2016-09-20 MED ORDER — REGADENOSON 0.4 MG/5ML IV SOLN
0.4000 mg | Freq: Once | INTRAVENOUS | Status: AC
  Administered 2016-09-21: 0.4 mg via INTRAVENOUS
  Filled 2016-09-20: qty 5

## 2016-09-20 NOTE — Progress Notes (Signed)
Pt hemoglobin dropped to 9.7. MD notified. I will report these finding to the on-coming shift. Will continue to monitor.

## 2016-09-20 NOTE — Plan of Care (Signed)
Problem: Nutrition: Goal: Adequate nutrition will be maintained Outcome: Progressing Diet is advancing to solids, tolerating full liquids.

## 2016-09-20 NOTE — Care Management Obs Status (Signed)
[place] NOTIFICATION   Patient Details  Name: CHICQUITA MENDEL MRN: [id number] Date of Birth: 1941-06-02   Medicare Observation Status Notification Given:  Yes    Bethena Roys, RN 09/20/2016, 11:46 AM

## 2016-09-20 NOTE — Progress Notes (Signed)
PROGRESS NOTE  Subjective:   75 yo with Afib with RVR, abdominal pain Hx of AVR - St. Jude prosthesis in 2007.    On chronic anticoagulation.  Hx of HTN   Objective:    Vital Signs:   Temp:  [98.2 F (36.8 C)-99.3 F (37.4 C)] 98.2 F (36.8 C) (09/25 0426) Pulse Rate:  [68-86] 86 (09/25 0426) Resp:  [21-22] 21 (09/25 0426) BP: (111-130)/(51-65) 114/65 (09/25 0426) SpO2:  [93 %-95 %] 93 % (09/25 0426) Weight:  [138 lb 14.4 oz (63 kg)-140 lb 8 oz (63.7 kg)] 140 lb 8 oz (63.7 kg) (09/25 0426)  Last BM Date: 09/17/16   24-hour weight change: Weight change: -9 lb 1.6 oz (-4.128 kg)  Weight trends: Filed Weights   09/19/16 0545 09/19/16 1019 09/20/16 0426  Weight: 148 lb (67.1 kg) 138 lb 14.4 oz (63 kg) 140 lb 8 oz (63.7 kg)    Intake/Output:  09/24 0701 - 09/25 0700 In: 7124 [P.O.:360; I.V.:1018; IV Piggyback:50] Out: -  No intake/output data recorded.   Physical Exam: BP 114/65 (BP Location: Right Arm)   Pulse 86   Temp 98.2 F (36.8 C) (Oral)   Resp (!) 21   Ht 5' (1.524 m)   Wt 140 lb 8 oz (63.7 kg)   SpO2 93%   BMI 27.44 kg/m   Wt Readings from Last 3 Encounters:  09/20/16 140 lb 8 oz (63.7 kg)  09/17/16 141 lb (64 kg)  09/16/16 138 lb 9.6 oz (62.9 kg)    General: Vital signs reviewed and noted.   Head: Normocephalic, atraumatic.  Eyes: conjunctivae/corneas clear.  EOM's intact.   Throat: normal  Neck:  normal   Lungs:    clear  Heart:  RR, mechanical S2  Abdomen:  Soft, mildly tender  Extremities: No edema    Neurologic: A&O X3, CN II - XII are grossly intact.   Psych: Normal     Labs: BMET:  Recent Labs  09/19/16 0550 09/20/16 0600  NA 135 133*  K 3.5 3.8  CL 98* 101  CO2 28 26  GLUCOSE 120* 92  BUN 18 17  CREATININE 1.55* 1.60*  CALCIUM 9.8 8.2*    Liver function tests:  Recent Labs  09/19/16 0550  AST 36  ALT 7*  ALKPHOS 47  BILITOT 0.8  PROT 6.4*  ALBUMIN 3.6    Recent Labs  09/19/16 0550  LIPASE 19      CBC:  Recent Labs  09/19/16 0550 09/20/16 0600  WBC 10.8* 6.4  NEUTROABS 9.1*  --   HGB 11.6* 9.7*  HCT 35.2* 29.5*  MCV 102.0* 102.4*  PLT 160 142*    Cardiac Enzymes:  Recent Labs  09/19/16 1149 09/19/16 1827  TROPONINI 0.37* 0.27*    Coagulation Studies:  Recent Labs  09/19/16 0550  LABPROT 17.6*  INR 1.43    Other: Invalid input(s): POCBNP No results for input(s): DDIMER in the last 72 hours. No results for input(s): HGBA1C in the last 72 hours. No results for input(s): CHOL, HDL, LDLCALC, TRIG, CHOLHDL in the last 72 hours. No results for input(s): TSH, T4TOTAL, T3FREE, THYROIDAB in the last 72 hours.  Invalid input(s): FREET3 No results for input(s): VITAMINB12, FOLATE, FERRITIN, TIBC, IRON, RETICCTPCT in the last 72 hours.   Other results:  tele ( personally reviewed )  - NSR   Medications:    Infusions: . sodium chloride 100 mL/hr at 09/20/16 0702    Scheduled Medications: .  carbidopa-levodopa  2 tablet Oral TID  . cefTRIAXone (ROCEPHIN)  IV  1 g Intravenous Q24H  . enoxaparin (LOVENOX) injection  1 mg/kg Subcutaneous Q24H  . oxybutynin  2.5 mg Oral BID  . pantoprazole  40 mg Oral Daily  . polyethylene glycol  17 g Oral Daily  . senna  1 tablet Oral BID  . simvastatin  20 mg Oral QPM  . sodium chloride flush  3 mL Intravenous Q12H  . triamterene-hydrochlorothiazide  1 tablet Oral Daily  . warfarin  7.5 mg Oral ONCE-1800  . Warfarin - Pharmacist Dosing Inpatient   Does not apply q1800    Assessment/ Plan:   Principal Problem:   UTI (urinary tract infection) Active Problems:   GERD   S/P AVR (aortic valve replacement)   HTN (hypertension)   Hypercholesteremia   A-fib (HCC)   Hiatal hernia   Elevated troponin   Nausea with vomiting   1. Chest pain / tightness. Has resolved. May have be demand ischemia from the rapid atrial fib. Would have a low threshold to do a cath if she has further episodes of chest tightness Will do  a [employer] tomorrow  2. Paroxysmal atrial fib:   Is NSR currently  3. S/p AVR -    Disposition:  Length of Stay: 0  Thayer Headings, Brooke Bonito., MD, [facility] 09/20/2016, 10:12 AM Office [phone] Pager [phone]

## 2016-09-20 NOTE — Progress Notes (Signed)
   Subjective: Patient was evaluated this morning on rounds. She no longer has abdominal pain in her lower quadrants bilaterally. She states she was able to eat grits and pudding without having any nausea or vomiting. She has been able to urinate without difficulty and continues to see blood in her urine. She denies any shortness of breath or chest pain.  Objective:  Vital signs in last 24 hours: Vitals:   09/19/16 1019 09/19/16 1411 09/19/16 2146 09/20/16 0426  BP: 130/63 (!) 125/55 (!) 111/51 114/65  Pulse: 68 75 71 86  Resp:   (!) 22 (!) 21  Temp: 98.7 F (37.1 C) 99.3 F (37.4 C) 98.4 F (36.9 C) 98.2 F (36.8 C)  TempSrc: Oral Oral Oral Oral  SpO2: 94% 93% 95% 93%  Weight: 138 lb 14.4 oz (63 kg)   140 lb 8 oz (63.7 kg)  Height: 5' (1.524 m)      Physical Exam  Constitutional: She is well-developed, well-nourished, and in no distress.  Cardiovascular:  Regular rate and rhythm, systolic click  Pulmonary/Chest: Effort normal and breath sounds normal. No respiratory distress. She has no wheezes. She has no rales.  Abdominal: Soft. Bowel sounds are normal. She exhibits no distension.  Tenderness to palpation in lower abdominal quadrants  Musculoskeletal: She exhibits no edema.  Skin: Skin is warm and dry.     Assessment/Plan:  Principal Problem:   UTI (urinary tract infection) Active Problems:   GERD   S/P AVR (aortic valve replacement)   HTN (hypertension)   Hypercholesteremia   A-fib (HCC)   Hiatal hernia   Elevated troponin   Nausea with vomiting  Urinary Tract Infection Patient is afebrile with no leukocytosis. Patient states she no longer has abdominal pain in her lower quadrants bilaterally. On exam she is mildly tender to palpation in her lower abdomen.  She denies any nausea and vomiting and is able to keep solids and liquids. She continues to report blood in her urine. This  likely explains her drop in hemoglobin from 11.6 to 9.7 today. Urine culture shows  insignificant growth. Patient is on IV ceftriaxone and will be switched to oral medications tomorrow. - IV ceftriaxone - CBC in the morning   Atrial Fibrillation Currently in sinus rhythm. Patient denies any rapid palpitations or chest pain overnight. Her troponins that have trended down since yesterday. -Continue warfarin and Lovenox bridge -Telemetry  Hypertension BP 114/65 - Continue triamterene-hydrochlorothiazide 37.5-25 MG  Hyperlipidemia -Continue home Simvastatin 20mg  daily  Dispo: Anticipated discharge in approximately 1 day(s).   Valinda Party, DO 09/20/2016, 10:18 AM Pager: [phone]

## 2016-09-20 NOTE — Progress Notes (Signed)
ANTICOAGULATION CONSULT NOTE - Follow Up Consult  Pharmacy Consult for coumadin Indication: AVR  Allergies  Allergen Reactions  . Demerol [Meperidine] Shortness Of Breath and Swelling  . Oxycodone Other (See Comments)    Hallucinations   . Adhesive [Tape] Other (See Comments)    Redness and skin tears  . Ivp Dye [Iodinated Diagnostic Agents] Hives    OK with benadryl pre-med (50mg  one hour before receiving iodinated contrast agent)  . Phenergan [Promethazine] Other (See Comments)    Avoids due to reaction with current medications    Patient Measurements: Height: 5' (152.4 cm) Weight: 140 lb 8 oz (63.7 kg) IBW/kg (Calculated) : 45.5  Vital Signs: Temp: 98.2 F (36.8 C) (09/25 0426) Temp Source: Oral (09/25 0426) BP: 114/65 (09/25 0426) Pulse Rate: 86 (09/25 0426)  Labs:  Recent Labs  09/19/16 0550 09/19/16 1149 09/19/16 1827 09/20/16 0600  HGB 11.6*  --   --  9.7*  HCT 35.2*  --   --  29.5*  PLT 160  --   --  142*  LABPROT 17.6*  --   --   --   INR 1.43  --   --   --   CREATININE 1.55*  --   --  1.60*  TROPONINI  --  0.37* 0.27*  --     Estimated Creatinine Clearance: 24.9 mL/min (by C-G formula based on SCr of 1.6 mg/dL (H)).   Medications:  Prescriptions Prior to Admission  Medication Sig Dispense Refill Last Dose  . calcium citrate-vitamin D (CITRACAL+D) 315-200 MG-UNIT per tablet Take 1 tablet by mouth 3 (three) times daily.   09/18/2016 at Unknown time  . carbidopa-levodopa (SINEMET IR) 25-100 MG tablet Take 2 tablets by mouth 3 (three) times daily. 540 tablet 1 09/18/2016 at Unknown time  . Cholecalciferol (VITAMIN D3) 2000 units TABS Take 1 capsule by mouth daily.   09/18/2016 at Unknown time  . enoxaparin (LOVENOX) 80 MG/0.8ML injection Inject 80 mg into the skin 2 (two) times daily.    09/18/2016 at 1815  . famotidine-calcium carbonate-magnesium hydroxide (PEPCID COMPLETE) 10-800-165 MG CHEW chewable tablet Chew 1 tablet by mouth daily as needed  (heartburn).   09/18/2016 at Unknown time  . Multiple Vitamin (MULTIVITAMIN) tablet Take 1 tablet by mouth daily at 12 noon.    09/18/2016 at Unknown time  . Multiple Vitamins-Minerals (VISION-VITE PRESERVE PO) Take 1 tablet by mouth 2 (two) times daily.   09/18/2016 at Unknown time  . oxybutynin (DITROPAN) 5 MG tablet Take 2.5 mg by mouth 2 (two) times daily.    09/18/2016 at Unknown time  . pantoprazole (PROTONIX) 40 MG tablet Take 40 mg by mouth every morning.    09/18/2016 at Unknown time  . phenazopyridine (PYRIDIUM) 200 MG tablet Take 1 tablet (200 mg total) by mouth 3 (three) times daily as needed for pain. 10 tablet 0 09/18/2016 at Unknown time  . simvastatin (ZOCOR) 20 MG tablet Take 20 mg by mouth every evening.   09/18/2016 at Unknown time  . triamterene-hydrochlorothiazide (MAXZIDE-25) 37.5-25 MG per tablet Take 1 tablet by mouth every morning.    09/18/2016 at Unknown time  . UNABLE TO FIND Take 900 mg by mouth daily at 12 noon. Omega Red    09/18/2016 at Unknown time  . vitamin B-12 (CYANOCOBALAMIN) 1000 MCG tablet Take 1,000 mcg by mouth every morning.   09/18/2016 at Unknown time  . warfarin (COUMADIN) 5 MG tablet Take 5 mg by mouth every evening. 7.5 mg every Tues,  Thurs, Sat; 5 mg all other days   09/18/2016 at 1800   Scheduled:  . carbidopa-levodopa  2 tablet Oral TID  . cefTRIAXone (ROCEPHIN)  IV  1 g Intravenous Q24H  . enoxaparin (LOVENOX) injection  1 mg/kg Subcutaneous Q24H  . oxybutynin  2.5 mg Oral BID  . pantoprazole  40 mg Oral Daily  . polyethylene glycol  17 g Oral Daily  . senna  1 tablet Oral BID  . simvastatin  20 mg Oral QPM  . sodium chloride flush  3 mL Intravenous Q12H  . triamterene-hydrochlorothiazide  1 tablet Oral Daily  . Warfarin - Pharmacist Dosing Inpatient   Does not apply q1800    Assessment: 75 yo female her with abdominal pain and noted  s/p urteral calculus removal on 9/22. She has a history of AVR and afib on coumadin and pharmacy consulted to dose  coumadin. Lovenox has been adjusted to once daily with CrCl ~ 25.  -INR= 1.43 (9/24)   PTA regimen was enoxaparin 80mg  [place] bid and warfarin 7.5mg  TuThSa, 5mg  MWFSu  Goal of Therapy:  INR goal 2.5-3.5 Monitor platelets by anticoagulation protocol: Yes   Plan:  -Coumadin 7.5mg  today -Daily PT/INR  Hildred Laser, Pharm D 09/20/2016 10:03 AM

## 2016-09-21 ENCOUNTER — Inpatient Hospital Stay (HOSPITAL_COMMUNITY): Payer: PPO

## 2016-09-21 DIAGNOSIS — B9689 Other specified bacterial agents as the cause of diseases classified elsewhere: Secondary | ICD-10-CM

## 2016-09-21 DIAGNOSIS — N390 Urinary tract infection, site not specified: Secondary | ICD-10-CM

## 2016-09-21 DIAGNOSIS — R079 Chest pain, unspecified: Secondary | ICD-10-CM

## 2016-09-21 DIAGNOSIS — R0789 Other chest pain: Secondary | ICD-10-CM

## 2016-09-21 LAB — CBC
HCT: 28.9 % — ABNORMAL LOW (ref 36.0–46.0)
Hemoglobin: 9.4 g/dL — ABNORMAL LOW (ref 12.0–15.0)
MCH: 33.3 pg (ref 26.0–34.0)
MCHC: 32.5 g/dL (ref 30.0–36.0)
MCV: 102.5 fL — ABNORMAL HIGH (ref 78.0–100.0)
Platelets: 161 10*3/uL (ref 150–400)
RBC: 2.82 MIL/uL — ABNORMAL LOW (ref 3.87–5.11)
RDW: 13.4 % (ref 11.5–15.5)
WBC: 4.7 10*3/uL (ref 4.0–10.5)

## 2016-09-21 LAB — NM MYOCAR MULTI W/SPECT W/WALL MOTION / EF
Estimated workload: 1 METS
Exercise duration (min): 6 min
Exercise duration (sec): 47 s
Peak HR: 115 {beats}/min
Rest HR: 66 {beats}/min

## 2016-09-21 LAB — BASIC METABOLIC PANEL
Anion gap: 7 (ref 5–15)
BUN: 17 mg/dL (ref 6–20)
CO2: 25 mmol/L (ref 22–32)
Calcium: 8.6 mg/dL — ABNORMAL LOW (ref 8.9–10.3)
Chloride: 103 mmol/L (ref 101–111)
Creatinine, Ser: 1.58 mg/dL — ABNORMAL HIGH (ref 0.44–1.00)
GFR calc Af Amer: 36 mL/min — ABNORMAL LOW (ref >60)
GFR calc non Af Amer: 31 mL/min — ABNORMAL LOW (ref >60)
Glucose, Bld: 124 mg/dL — ABNORMAL HIGH (ref 65–99)
Potassium: 3.6 mmol/L (ref 3.5–5.1)
Sodium: 135 mmol/L (ref 135–145)

## 2016-09-21 LAB — PROTIME-INR
INR: 2.52
Prothrombin Time: 27.6 seconds — ABNORMAL HIGH (ref 11.4–15.2)

## 2016-09-21 MED ORDER — SULFAMETHOXAZOLE-TRIMETHOPRIM 800-160 MG PO TABS: 1.0000 | ORAL_TABLET | Freq: Every day | ORAL | 0 refills | Status: DC

## 2016-09-21 MED ORDER — REGADENOSON 0.4 MG/5ML IV SOLN
0.4000 mg | Freq: Once | INTRAVENOUS | Status: AC
  Administered 2016-09-21: 0.4 mg via INTRAVENOUS

## 2016-09-21 MED ORDER — AMOXICILLIN-POT CLAVULANATE 500-125 MG PO TABS: 1.0000 | ORAL_TABLET | Freq: Two times a day (BID) | ORAL | 0 refills | Status: DC

## 2016-09-21 MED ORDER — WARFARIN SODIUM 2.5 MG PO TABS: 2.5000 mg | ORAL_TABLET | Freq: Once | ORAL | Status: DC

## 2016-09-21 MED ORDER — TECHNETIUM TC 99M TETROFOSMIN IV KIT
10.0000 | PACK | Freq: Once | INTRAVENOUS | Status: AC | PRN
  Administered 2016-09-21: 10 via INTRAVENOUS

## 2016-09-21 MED ORDER — REGADENOSON 0.4 MG/5ML IV SOLN
INTRAVENOUS | Status: AC
  Administered 2016-09-21: 0.4 mg via INTRAVENOUS
  Filled 2016-09-21: qty 5

## 2016-09-21 MED ORDER — BISACODYL 10 MG RE SUPP
10.0000 mg | Freq: Once | RECTAL | Status: AC
  Administered 2016-09-21: 10 mg via RECTAL
  Filled 2016-09-21: qty 1

## 2016-09-21 MED ORDER — WARFARIN SODIUM 2.5 MG PO TABS: 2.5000 mg | ORAL_TABLET | Freq: Once | ORAL | 0 refills | Status: DC

## 2016-09-21 NOTE — Progress Notes (Signed)
Pt OOB to BR -notified by CCMD Pts HR up to 140's. Sinus Tach with PAC's. Pt asymptomatic. HR back to SR 80-90's once back to bed. Strips saved per CCMD. Will continue to monitor. Jessie Foot, RN

## 2016-09-21 NOTE — Discharge Instructions (Signed)
Atrial Fibrillation °Atrial fibrillation is a type of irregular or rapid heartbeat (arrhythmia). In atrial fibrillation, the heart quivers continuously in a chaotic pattern. This occurs when parts of the heart receive disorganized signals that make the heart unable to pump blood normally. This can increase the risk for stroke, heart failure, and other heart-related conditions. There are different types of atrial fibrillation, including: °· Paroxysmal atrial fibrillation. This type starts suddenly, and it usually stops on its own shortly after it starts. °· Persistent atrial fibrillation. This type often lasts longer than a week. It may stop on its own or with treatment. °· Long-lasting persistent atrial fibrillation. This type lasts longer than 12 months. °· Permanent atrial fibrillation. This type does not go away. °Talk with your health care provider to learn about the type of atrial fibrillation that you have. °CAUSES °This condition is caused by some heart-related conditions or procedures, including: °· A heart attack. °· Coronary artery disease. °· Heart failure. °· Heart valve conditions. °· High blood pressure. °· Inflammation of the sac that surrounds the heart (pericarditis). °· Heart surgery. °· Certain heart rhythm disorders, such as Wolf-Parkinson-White syndrome. °Other causes include: °· Pneumonia. °· Obstructive sleep apnea. °· Blockage of an artery in the lungs (pulmonary embolism, or PE). °· Lung cancer. °· Chronic lung disease. °· Thyroid problems, especially if the thyroid is overactive (hyperthyroidism). °· Caffeine. °· Excessive alcohol use or illegal drug use. °· Use of some medicines, including certain decongestants and diet pills. °Sometimes, the cause cannot be found. °RISK FACTORS °This condition is more likely to develop in: °· People who are older in age. °· People who smoke. °· People who have diabetes mellitus. °· People who are overweight (obese). °· Athletes who exercise  vigorously. °SYMPTOMS °Symptoms of this condition include: °· A feeling that your heart is beating rapidly or irregularly. °· A feeling of discomfort or pain in your chest. °· Shortness of breath. °· Sudden light-headedness or weakness. °· Getting tired easily during exercise. °In some cases, there are no symptoms. °DIAGNOSIS °Your health care provider may be able to detect atrial fibrillation when taking your pulse. If detected, this condition may be diagnosed with: °· An electrocardiogram (ECG). °· A Holter monitor test that records your heartbeat patterns over a 24-hour period. °· Transthoracic echocardiogram (TTE) to evaluate how blood flows through your heart. °· Transesophageal echocardiogram (TEE) to view more detailed images of your heart. °· A stress test. °· Imaging tests, such as a CT scan or chest X-ray. °· Blood tests. °TREATMENT °The main goals of treatment are to prevent blood clots from forming and to keep your heart beating at a normal rate and rhythm. The type of treatment that you receive depends on many factors, such as your underlying medical conditions and how you feel when you are experiencing atrial fibrillation. °This condition may be treated with: °· Medicine to slow down the heart rate, bring the heart's rhythm back to normal, or prevent clots from forming. °· Electrical cardioversion. This is a procedure that resets your heart's rhythm by delivering a controlled, low-energy shock to the heart through your skin. °· Different types of ablation, such as catheter ablation, catheter ablation with pacemaker, or surgical ablation. These procedures destroy the heart tissues that send abnormal signals. When the pacemaker is used, it is placed under your skin to help your heart beat in a regular rhythm. °HOME CARE INSTRUCTIONS °· Take over-the counter and prescription medicines only as told by your health care provider. °·   If your health care provider prescribed a blood-thinning medicine  (anticoagulant), take it exactly as told. Taking too much blood-thinning medicine can cause bleeding. If you do not take enough blood-thinning medicine, you will not have the protection that you need against stroke and other problems. °· Do not use tobacco products, including cigarettes, chewing tobacco, and e-cigarettes. If you need help quitting, ask your health care provider. °· If you have obstructive sleep apnea, manage your condition as told by your health care provider. °· Do not drink alcohol. °· Do not drink beverages that contain caffeine, such as coffee, soda, and tea. °· Maintain a healthy weight. Do not use diet pills unless your health care provider approves. Diet pills may make heart problems worse. °· Follow diet instructions as told by your health care provider. °· Exercise regularly as told by your health care provider. °· Keep all follow-up visits as told by your health care provider. This is important. °PREVENTION °· Avoid drinking beverages that contain caffeine or alcohol. °· Avoid certain medicines, especially medicines that are used for breathing problems. °· Avoid certain herbs and herbal medicines, such as those that contain ephedra or ginseng. °· Do not use illegal drugs, such as cocaine and amphetamines. °· Do not smoke. °· Manage your high blood pressure. °SEEK MEDICAL CARE IF: °· You notice a change in the rate, rhythm, or strength of your heartbeat. °· You are taking an anticoagulant and you notice increased bruising. °· You tire more easily when you exercise or exert yourself. °SEEK IMMEDIATE MEDICAL CARE IF: °· You have chest pain, abdominal pain, sweating, or weakness. °· You feel nauseous. °· You notice blood in your vomit, bowel movement, or urine. °· You have shortness of breath. °· You suddenly have swollen feet and ankles. °· You feel dizzy. °· You have sudden weakness or numbness of the face, arm, or leg, especially on one side of the body. °· You have trouble speaking,  trouble understanding, or both (aphasia). °· Your face or your eyelid droops on one side. °These symptoms may represent a serious problem that is an emergency. Do not wait to see if the symptoms will go away. Get medical help right away. Call your local emergency services (911 in the [place] Do not drive yourself to the hospital. °  °This information is not intended to replace advice given to you by your health care provider. Make sure you discuss any questions you have with your health care provider. °  °Document Released: 12/13/2005 Document Revised: 09/03/2015 Document Reviewed: 04/09/2015 °Elsevier Interactive Patient Education ©2016 [employer] ° ° °Nonspecific Chest Pain  °Chest pain can be caused by many different conditions. There is always a chance that your pain could be related to something serious, such as a heart attack or a blood clot in your lungs. Chest pain can also be caused by conditions that are not life-threatening. If you have chest pain, it is very important to follow up with your health care provider. °CAUSES  °Chest pain can be caused by: °· Heartburn. °· Pneumonia or bronchitis. °· Anxiety or stress. °· Inflammation around your heart (pericarditis) or lung (pleuritis or pleurisy). °· A blood clot in your lung. °· A collapsed lung (pneumothorax). It can develop suddenly on its own (spontaneous pneumothorax) or from trauma to the chest. °· Shingles infection (varicella-zoster virus). °· Heart attack. °· Damage to the bones, muscles, and cartilage that make up your chest wall. This can include: °¨ Bruised bones due to injury. °¨   Strained muscles or cartilage due to frequent or repeated coughing or overwork. °¨ Fracture to one or more ribs. °¨ Sore cartilage due to inflammation (costochondritis). °RISK FACTORS  °Risk factors for chest pain may include: °· Activities that increase your risk for trauma or injury to your chest. °· Respiratory infections or conditions that cause frequent  coughing. °· Medical conditions or overeating that can cause heartburn. °· Heart disease or family history of heart disease. °· Conditions or health behaviors that increase your risk of developing a blood clot. °· Having had chicken pox (varicella zoster). °SIGNS AND SYMPTOMS °Chest pain can feel like: °· Burning or tingling on the surface of your chest or deep in your chest. °· Crushing, pressure, aching, or squeezing pain. °· Dull or sharp pain that is worse when you move, cough, or take a deep breath. °· Pain that is also felt in your back, neck, shoulder, or arm, or pain that spreads to any of these areas. °Your chest pain may come and go, or it may stay constant. °DIAGNOSIS °Lab tests or other studies may be needed to find the cause of your pain. Your health care provider may have you take a test called an ambulatory ECG (electrocardiogram). An ECG records your heartbeat patterns at the time the test is performed. You may also have other tests, such as: °· Transthoracic echocardiogram (TTE). During echocardiography, sound waves are used to create a picture of all of the heart structures and to look at how blood flows through your heart. °· Transesophageal echocardiogram (TEE). This is a more advanced imaging test that obtains images from inside your body. It allows your health care provider to see your heart in finer detail. °· Cardiac monitoring. This allows your health care provider to monitor your heart rate and rhythm in real time. °· Holter monitor. This is a portable device that records your heartbeat and can help to diagnose abnormal heartbeats. It allows your health care provider to track your heart activity for several days, if needed. °· Stress tests. These can be done through exercise or by taking medicine that makes your heart beat more quickly. °· Blood tests. °· Imaging tests. °TREATMENT  °Your treatment depends on what is causing your chest pain. Treatment may include: °· Medicines. These may  include: °¨ Acid blockers for heartburn. °¨ Anti-inflammatory medicine. °¨ Pain medicine for inflammatory conditions. °¨ Antibiotic medicine, if an infection is present. °¨ Medicines to dissolve blood clots. °¨ Medicines to treat coronary artery disease. °· Supportive care for conditions that do not require medicines. This may include: °¨ Resting. °¨ Applying heat or cold packs to injured areas. °¨ Limiting activities until pain decreases. °HOME CARE INSTRUCTIONS °· If you were prescribed an antibiotic medicine, finish it all even if you start to feel better. °· Avoid any activities that bring on chest pain. °· Do not use any tobacco products, including cigarettes, chewing tobacco, or electronic cigarettes. If you need help quitting, ask your health care provider. °· Do not drink alcohol. °· Take medicines only as directed by your health care provider. °· Keep all follow-up visits as directed by your health care provider. This is important. This includes any further testing if your chest pain does not go away. °· If heartburn is the cause for your chest pain, you may be told to keep your head raised (elevated) while sleeping. This reduces the chance that acid will go from your stomach into your esophagus. °· Make lifestyle changes as directed by your health   care provider. These may include: °¨ Getting regular exercise. Ask your health care provider to suggest some activities that are safe for you. °¨ Eating a heart-healthy diet. A registered dietitian can help you to learn healthy eating options. °¨ Maintaining a healthy weight. °¨ Managing diabetes, if necessary. °¨ Reducing stress. °SEEK MEDICAL CARE IF: °· Your chest pain does not go away after treatment. °· You have a rash with blisters on your chest. °· You have a fever. °SEEK IMMEDIATE MEDICAL CARE IF:  °· Your chest pain is worse. °· You have an increasing cough, or you cough up blood. °· You have severe abdominal pain. °· You have severe weakness. °· You  faint. °· You have chills. °· You have sudden, unexplained chest discomfort. °· You have sudden, unexplained discomfort in your arms, back, neck, or jaw. °· You have shortness of breath at any time. °· You suddenly start to sweat, or your skin gets clammy. °· You feel nauseous or you vomit. °· You suddenly feel light-headed or dizzy. °· Your heart begins to beat quickly, or it feels like it is skipping beats. °These symptoms may represent a serious problem that is an emergency. Do not wait to see if the symptoms will go away. Get medical help right away. Call your local emergency services (911 in the [place] Do not drive yourself to the hospital. °  °This information is not intended to replace advice given to you by your health care provider. Make sure you discuss any questions you have with your health care provider. °  °Document Released: 09/22/2005 Document Revised: 01/03/2015 Document Reviewed: 07/19/2014 °Elsevier Interactive Patient Education ©2016 [employer] ° °

## 2016-09-21 NOTE — Progress Notes (Signed)
Patient Name: [name] Date of Encounter: 09/21/2016   SUBJECTIVE  Seen in nuc med. 2/10 chest tightness since this morning. No SOB.   CURRENT MEDS . bisacodyl  10 mg Rectal Once  . carbidopa-levodopa  2 tablet Oral TID  . cefTRIAXone (ROCEPHIN)  IV  1 g Intravenous Q24H  . enoxaparin (LOVENOX) injection  1 mg/kg Subcutaneous Q24H  . oxybutynin  2.5 mg Oral BID  . pantoprazole  40 mg Oral Daily  . polyethylene glycol  17 g Oral Daily  . regadenoson      . regadenoson  0.4 mg Intravenous Once  . senna  1 tablet Oral BID  . simvastatin  20 mg Oral QPM  . sodium chloride flush  3 mL Intravenous Q12H  . triamterene-hydrochlorothiazide  1 tablet Oral Daily  . warfarin  2.5 mg Oral ONCE-1800  . Warfarin - Pharmacist Dosing Inpatient   Does not apply q1800    OBJECTIVE  Vitals:   09/20/16 1132 09/20/16 2122 09/21/16 0500 09/21/16 0919  BP: (!) 109/46 (!) 115/53 (!) 120/47 123/60  Pulse: 75 76 74 70  Resp:  (!) 24    Temp: 98.2 F (36.8 C) 99.1 F (37.3 C) 99.1 F (37.3 C)   TempSrc: Oral Oral Oral   SpO2: 95% 93% 97%   Weight:   140 lb 12.8 oz (63.9 kg)   Height:        Intake/Output Summary (Last 24 hours) at 09/21/16 0934 Last data filed at 09/20/16 1730  Gross per 24 hour  Intake              400 ml  Output                0 ml  Net              400 ml   Filed Weights   09/19/16 1019 09/20/16 0426 09/21/16 0500  Weight: 138 lb 14.4 oz (63 kg) 140 lb 8 oz (63.7 kg) 140 lb 12.8 oz (63.9 kg)    PHYSICAL EXAM  General: Pleasant, NAD. Neuro: Alert and oriented X 3. Moves all extremities spontaneously. Psych: Normal affect. HEENT:  Normal  Neck: Supple without bruits or JVD. Lungs:  Resp regular and unlabored, CTA. Heart: RRR no s3, s4, or murmurs. Abdomen: Soft, non-tender, non-distended, BS + x 4.  Extremities: No clubbing, cyanosis or edema. DP/PT/Radials 2+ and equal bilaterally.  Accessory Clinical Findings  CBC  Recent Labs  09/19/16 0550  09/20/16 0600 09/21/16 0404  WBC 10.8* 6.4 4.7  NEUTROABS 9.1*  --   --   HGB 11.6* 9.7* 9.4*  HCT 35.2* 29.5* 28.9*  MCV 102.0* 102.4* 102.5*  PLT 160 142* 093   Basic Metabolic Panel  Recent Labs  09/19/16 0550 09/20/16 0600  NA 135 133*  K 3.5 3.8  CL 98* 101  CO2 28 26  GLUCOSE 120* 92  BUN 18 17  CREATININE 1.55* 1.60*  CALCIUM 9.8 8.2*   Liver Function Tests  Recent Labs  09/19/16 0550  AST 36  ALT 7*  ALKPHOS 47  BILITOT 0.8  PROT 6.4*  ALBUMIN 3.6    Recent Labs  09/19/16 0550  LIPASE 19   Cardiac Enzymes  Recent Labs  09/19/16 1149 09/19/16 1827  TROPONINI 0.37* 0.27*   BNP Invalid input(s): POCBNP D-Dimer No results for input(s): DDIMER in the last 72 hours. Hemoglobin A1C No results for input(s): HGBA1C in the last 72 hours. Fasting Lipid Panel  No results for input(s): CHOL, HDL, LDLCALC, TRIG, CHOLHDL, LDLDIRECT in the last 72 hours. Thyroid Function Tests No results for input(s): TSH, T4TOTAL, T3FREE, THYROIDAB in the last 72 hours.  Invalid input(s): FREET3  TELE  Unable to review as patient seen in nuc med.   Radiology/Studies  Ct Head Wo Contrast  Result Date: 08/28/2016 CLINICAL DATA:  Fall on Coumadin. Headache and neck pain. No loss of consciousness. History of frequent falls due to Parkinson's disease. EXAM: CT HEAD WITHOUT CONTRAST CT CERVICAL SPINE WITHOUT CONTRAST TECHNIQUE: Multidetector CT imaging of the head and cervical spine was performed following the standard protocol without intravenous contrast. Multiplanar CT image reconstructions of the cervical spine were also generated. COMPARISON:  MRI cervical spine 06/03/2016. Cervical spine radiographs 05/17/2016. FINDINGS: CT HEAD FINDINGS Diffuse cerebral atrophy. Ventricular dilatation consistent with central atrophy. Low-attenuation changes in the deep white matter consistent small vessel ischemia. No mass effect or midline shift. No abnormal extra-axial fluid  collections. Gray-white matter junctions are distinct. Basal cisterns are not effaced. No evidence of acute intracranial hemorrhage. No depressed skull fractures. Visualized paranasal sinuses and mastoid air cells are not opacified. Vascular calcifications. CT CERVICAL SPINE FINDINGS There is reversal of the usual cervical lordosis. This may be due to patient positioning but ligamentous injury or muscle spasm can also have this appearance and are not excluded. Degenerative changes in the lower cervical region with narrowed interspaces and endplate hypertrophic changes. No vertebral compression deformities. No prevertebral soft tissue swelling. Normal alignment of the facet joints. No focal bone lesions or bone destruction. C1-2 articulation appears intact. Congenital nonunion of the posterior arch of C1. Vascular calcifications in the cervical carotid arteries. IMPRESSION: No acute intracranial abnormalities. Chronic atrophy and small vessel ischemic changes. Nonspecific reversal of the usual cervical lordosis. Mild degenerative changes in the lower cervical spine. No acute displaced fractures are identified. Electronically Signed   By: Lucienne Capers M.D.   On: 08/28/2016 20:06   Ct Cervical Spine Wo Contrast  Result Date: 08/28/2016 CLINICAL DATA:  Fall on Coumadin. Headache and neck pain. No loss of consciousness. History of frequent falls due to Parkinson's disease. EXAM: CT HEAD WITHOUT CONTRAST CT CERVICAL SPINE WITHOUT CONTRAST TECHNIQUE: Multidetector CT imaging of the head and cervical spine was performed following the standard protocol without intravenous contrast. Multiplanar CT image reconstructions of the cervical spine were also generated. COMPARISON:  MRI cervical spine 06/03/2016. Cervical spine radiographs 05/17/2016. FINDINGS: CT HEAD FINDINGS Diffuse cerebral atrophy. Ventricular dilatation consistent with central atrophy. Low-attenuation changes in the deep white matter consistent small  vessel ischemia. No mass effect or midline shift. No abnormal extra-axial fluid collections. Gray-white matter junctions are distinct. Basal cisterns are not effaced. No evidence of acute intracranial hemorrhage. No depressed skull fractures. Visualized paranasal sinuses and mastoid air cells are not opacified. Vascular calcifications. CT CERVICAL SPINE FINDINGS There is reversal of the usual cervical lordosis. This may be due to patient positioning but ligamentous injury or muscle spasm can also have this appearance and are not excluded. Degenerative changes in the lower cervical region with narrowed interspaces and endplate hypertrophic changes. No vertebral compression deformities. No prevertebral soft tissue swelling. Normal alignment of the facet joints. No focal bone lesions or bone destruction. C1-2 articulation appears intact. Congenital nonunion of the posterior arch of C1. Vascular calcifications in the cervical carotid arteries. IMPRESSION: No acute intracranial abnormalities. Chronic atrophy and small vessel ischemic changes. Nonspecific reversal of the usual cervical lordosis. Mild degenerative changes in the lower  cervical spine. No acute displaced fractures are identified. Electronically Signed   By: Lucienne Capers M.D.   On: 08/28/2016 20:06   Dg Shoulder Left  Result Date: 08/28/2016 CLINICAL DATA:  Fall in kitchen today. Left shoulder injury and pain. Initial encounter. EXAM: LEFT SHOULDER - 2+ VIEW COMPARISON:  None. FINDINGS: There is no evidence of fracture or dislocation. There is no evidence of arthropathy or other focal bone abnormality. Soft tissues are unremarkable. IMPRESSION: Negative. Electronically Signed   By: Earle Gell M.D.   On: 08/28/2016 19:52   Dg Abdomen Acute W/chest  Result Date: 09/19/2016 CLINICAL DATA:  Chest pressure starting around 7 p.m. Nausea and vomiting. Shortness of breath. Abdominal pain ever since stent placement for kidney stones on Friday. EXAM: DG  ABDOMEN ACUTE W/ 1V CHEST COMPARISON:  Abdominal fluoroscopy 09/17/2016. Abdomen 08/17/2016. Chest 10/03/2014. FINDINGS: Postoperative changes in the mediastinum. Normal heart size and pulmonary vascularity. Slight fibrosis or linear atelectasis in the left lung base. No focal airspace disease or consolidation in the lungs. No blunting of costophrenic angles. No pneumothorax. Esophageal hiatal hernia behind the heart. Scattered gas and stool throughout the colon. No small or large bowel distention. No free intra-abdominal air. No abnormal air-fluid levels. A right ureteral stent is in place with 2 calcifications adjacent to the distal stent likely representing ureteral stones. Small calcifications projected over the mid and lower pole left kidney consistent with intrarenal stones. Vascular calcifications. Lumbar scoliosis convex towards the left. IMPRESSION: No evidence of active pulmonary disease. Esophageal hiatal hernia behind the heart. Nonobstructive bowel gas pattern. Left renal stones. Right ureteral stent with probable right ureteral stones. Electronically Signed   By: Lucienne Capers M.D.   On: 09/19/2016 06:39    ASSESSMENT AND PLAN   1. Chest pain / tightness. - Recurrent chest tightness this morning. POC troponin 0.09. Troponin I 0.37-->0.27. Pending nuc result. May have be demand ischemia from the rapid atrial fib.  2. Paroxysmal atrial fib:   - Maintaining sinus rhythm. On warfarin for anticoagulation.   3. S/p AVR -  Per Dr. Caryl Comes during consult:  The pt has tachybrady syndrome which will need to be addressed later, with pacing almost certainly.    Jarrett Soho PA-C Pager [phone]  Attending Note:   The patient was seen and examined.  Agree with assessment and plan as noted above.  Changes made to the above note as needed.  Patient seen and independently examined with Vin Bhagat .   We discussed all aspects of the encounter. I agree with the assessment and  plan as stated above.  Pt has some atypical CP The nuclear study was low risk . She is stable for DC    I have spent a total of 20 minutes with patient reviewing hospital  notes , telemetry, EKGs, labs and examining patient as well as establishing an assessment and plan that was discussed with the patient. > 50% of time was spent in direct patient care.    Thayer Headings, Brooke Bonito., MD, [facility] 09/21/2016, 4:14 PM [address]  [place] Pager [phone]

## 2016-09-21 NOTE — Progress Notes (Signed)
Myoview low risk. Ok to discharge per Dr. Cathie Olden. Will make f/u appointment.

## 2016-09-21 NOTE — Discharge Summary (Signed)
Name: [name] MRN: [id number] DOB: February 29, 1941 75 y.o. PCP: Leanna Battles, MD  Date of Admission: 09/19/2016  5:39 AM Date of Discharge: 09/21/2016 Attending Physician: Annia Belt, MD  Discharge Diagnosis: 1. Urinary Tract Infection  Principal Problem:   UTI (urinary tract infection) Active Problems:   GERD   S/P AVR (aortic valve replacement)   HTN (hypertension)   Hypercholesteremia   Atrial fibrillation with RVR (HCC)   Hiatal hernia   Elevated troponin   Emesis   Discharge Medications:   Medication List    TAKE these medications   calcium citrate-vitamin D 315-200 MG-UNIT tablet Commonly known as:  CITRACAL+D Take 1 tablet by mouth 3 (three) times daily.   carbidopa-levodopa 25-100 MG tablet Commonly known as:  SINEMET IR Take 2 tablets by mouth 3 (three) times daily.   enoxaparin 80 MG/0.8ML injection Commonly known as:  LOVENOX Inject 80 mg into the skin 2 (two) times daily.   famotidine-calcium carbonate-magnesium hydroxide 10-800-165 MG chewable tablet Commonly known as:  PEPCID COMPLETE Chew 1 tablet by mouth daily as needed (heartburn).   MAXZIDE-25 37.5-25 MG tablet Generic drug:  triamterene-hydrochlorothiazide Take 1 tablet by mouth every morning.   multivitamin tablet Take 1 tablet by mouth daily at 12 noon.   oxybutynin 5 MG tablet Commonly known as:  DITROPAN Take 2.5 mg by mouth 2 (two) times daily.   pantoprazole 40 MG tablet Commonly known as:  PROTONIX Take 40 mg by mouth every morning.   phenazopyridine 200 MG tablet Commonly known as:  PYRIDIUM Take 1 tablet (200 mg total) by mouth 3 (three) times daily as needed for pain.   simvastatin 20 MG tablet Commonly known as:  ZOCOR Take 20 mg by mouth every evening.   sulfamethoxazole-trimethoprim 800-160 MG tablet Commonly known as:  BACTRIM DS Take 1 tablet by mouth daily.   UNABLE TO FIND Take 900 mg by mouth daily at 12 noon. Omega Red   VISION-VITE  PRESERVE PO Take 1 tablet by mouth 2 (two) times daily.   vitamin B-12 1000 MCG tablet Commonly known as:  CYANOCOBALAMIN Take 1,000 mcg by mouth every morning.   Vitamin D3 2000 units Tabs Take 1 capsule by mouth daily.   warfarin 5 MG tablet Commonly known as:  COUMADIN Take 5 mg by mouth every evening. 7.5 mg every Tues, Thurs, Sat; 5 mg all other days What changed:  Another medication with the same name was added. Make sure you understand how and when to take each.   warfarin 2.5 MG tablet Commonly known as:  COUMADIN Take 1 tablet (2.5 mg total) by mouth one time only at 6 PM. What changed:  You were already taking a medication with the same name, and this prescription was added. Make sure you understand how and when to take each.       Disposition and follow-up:   [name] was discharged from [facility] in good condition.  At the hospital follow up visit please address:  1.  Assess need for metoprolol, INR check and need for lovenox  2.  Labs / imaging needed at time of follow-up: CBC  3.  Pending labs/ test needing follow-up: none  Follow-up Appointments: Follow-up Information    Murray Hodgkins, NP. Go on 10/04/2016.   Specialties:  Nurse Practitioner, Cardiology, Radiology Why:  @1 :30pm for hosptial follow up Contact information: [address] [place] [address] [place]  G, MD .   Specialty:  Internal Medicine Contact information: [place] [address] [place] Hospital Course by problem list: Principal Problem:   UTI (urinary tract infection) Active Problems:   GERD   S/P AVR (aortic valve replacement)   HTN (hypertension)   Hypercholesteremia   Atrial fibrillation with RVR (HCC)   Hiatal hernia   Elevated troponin   Emesis   Urinary Tract Infection  She was admitted for further work up of her lower abdominal pain, nausea and vomiting.   Patient was afebrile during admission with mild leukocytosis that resolved prior to discharge. X ray showed no evidence of nonobstructive bowel gas pattern.  It also showed a right ureteral stent in place that was done a couple days prior to admission.  Her urinalysis showed positive nitrites and leukocytes.  She was treated with IV ceftriaxone and zofran and discharged with bactrim. On day of discharge she denied suprapubic or lower abdominal pain and did not endorse nausea or vomiting.   Atrial Fibrillation She had originally been transferred by ambulance to the ED because of increased palpitations and chest pain that resolved on arrival after given nitroglycerin. She has a history of atrial fibrillation with RVR but during admission she remained in normal sinus rhythm. She was continued on warfarin and lovenox bridge.Her troponins were elevated but trended downwards and was likely due to demand ischemia.  She had a myoview that showed no evidence of reversible ischemia. Cardiology noted the potential for a pacemaker in the future as they thought she may have brady/tachy syndrome.   Hypertension Her blood pressure was stable during admission and she was continued on her home triamterene-hydrochlorothiazide 37.5-25 MG  Hyperlipidemia She was continued on her home Simvastatin 20mg  daily  Discharge Vitals:   BP 106/85 (BP Location: Left Arm)   Pulse 68   Temp 98.1 F (36.7 C) (Oral)   Resp (!) 24   Ht 5' (1.524 m)   Wt 140 lb 12.8 oz (63.9 kg)   SpO2 95%   BMI 27.50 kg/m   Pertinent Labs, Studies, and Procedures:  Myoview, EKG  Discharge Instructions: Discharge Instructions    Diet - low sodium heart healthy    Complete by:  As directed    Discharge instructions    Complete by:  As directed    Please take your antibiotics once a day for the next 3 days. Please keep your appointment with Urology this week Please follow up with Cardiology and your primary care physician after  discharge Please take 2.5mg  of coumadin today and one more day of lovenox Please follow up for INR check tomorrow   Increase activity slowly    Complete by:  As directed       Signed: Valinda Party, DO 09/21/2016, 5:38 PM   Pager: [phone]

## 2016-09-21 NOTE — Progress Notes (Signed)
Reviewed discharge instructions with patient, husband, and daughter and they stated their understanding. Reviewed need to take 2.5mg  Coumadin today and to continue home lovenox injections.  Instructed husband to give Dr. Shon Baton office call first thing in the morning for further anticoagulant  Instructions.  Discharged home with family via wheelchair.  [name]

## 2016-09-21 NOTE — Discharge Summary (Signed)
Medicine attending discharge note: I personally examined this patient on the day of discharge and I tested the accuracy of the evaluation and management plan as recorded by resident physician Dr. Kalman Shan except for any changes that might be noted below.  Clinical summary: 75 year old woman with hypertension and valvular heart disease status post [employer] mechanical aortic valve replacement in 2007 on chronic warfarin anticoagulation. She has paroxysmal atrial fibrillation. Recent heart rates running low and metoprolol discontinued 1 week ago. Dose was 25 mg daily. She had an episode of palpitations with associated chest tightness which woke her from sleep. Ambulance was called. Initial heart rate approximately 170. Aspirin and nitroglycerin given. On arrival in the emergency department, she was back in sinus rhythm. Chronic left bundle branch block. No acute ischemic changes. Troponin was elevated at 0.09 point-of-care, peaked at 0.37 on a venous sample with repeat value 0.27. Cardiology has consulted.   2 days prior to admission on September 22, she had a urologic procedure with retrieval of a right ureteral stone and placement of a temporary stent. No bowel movement since that time. She currently complained of acute onset of lower abdominal pain, nausea, and vomiting. She denied any dysuria, hematuria, fever or chills. Urine analysis somewhat hard to interpret for acute infection with recent urologic manipulation with large positive hemoglobin and too numerous to count red cells but white cells 6-30. Positive nitrite. On initial exam abdomen was soft but tender to palpation over both lower quadrants. No peritoneal signs. No signs of obstruction or bowel perforation. Increased stool. White blood count increased 10,800 with 84% neutrophils.  Hospital course: She was started on antibiotics for presumed urinary tract infection. Abdominal pain did resolve coincident with antibiotic therapy. White  count normalized and was 4700 at time of discharge. Urine culture however showed insignificant growth. We consider that constipation may have been an additional reason for her lower abdominal discomfort. With respect to her cardiac issues, she was monitored on telemetry. No significant arrhythmias. No further episodes of either significant bradycardia or tachycardia. Heart rate remained in the 70-100/m range off beta blockers. Cardiology postulated demand ischemia from intermittent SVT as the reason for her elevated troponin. To rule out reversible ischemia, a Myoview stress test was done on September 26. There were no areas of reversible ischemia. There were fixed deficits in the septal wall either reflecting previous infarction or an artifact from her chronic left bundle branch block. Estimated left ventricular ejection fraction 53%. The study was considered low risk. 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 For now, the beta blockers have been discontinued.  Disposition: Pending final cardiology recommendations, the patient is stable for discharge. She will follow up with urology on September 28. We will keep her on oral antibiotics until her ureteral stent is removed. She will follow-up with cardiology within the next 1-2 weeks and report any interim symptoms. There were no complications.

## 2016-09-21 NOTE — Progress Notes (Signed)
Given interaction between Bactrim and Coumadin, patient was switched to Augmentin 500-125 mg BID to complete a 7 day course. Patient and the pharmacy were notified.   Martyn Malay, DO PGY-3 Internal Medicine Resident Pager # [phone] 09/21/2016 5:56 PM

## 2016-09-21 NOTE — Progress Notes (Signed)
Patient presented for Lexiscan. Tolerated procedure well. Pending final stress imaging result.  

## 2016-09-21 NOTE — Plan of Care (Signed)
Problem: Cardiac: Goal: Ability to achieve and maintain adequate cardiopulmonary perfusion will improve Outcome: Completed/Met Date Met: 09/21/16 Discussed with patient, husband and daughter INR lab values and the need to take 2.41m Coumadin today.  Instructed them to continue home lovenox injections for one more day per pharmacist and physician recommendations.  Also encouraged them to contact Dr. PShon Batonoffice coumadin clinic first thing in the morning for further instructions.  I called Dr. PShon Batonoffice and left a message with the coumadin clinic to update them.

## 2016-09-21 NOTE — Progress Notes (Signed)
ANTICOAGULATION CONSULT NOTE - Follow Up Consult  Pharmacy Consult for coumadin Indication: AVR  Allergies  Allergen Reactions  . Demerol [Meperidine] Shortness Of Breath and Swelling  . Oxycodone Other (See Comments)    Hallucinations   . Adhesive [Tape] Other (See Comments)    Redness and skin tears  . Ivp Dye [Iodinated Diagnostic Agents] Hives    OK with benadryl pre-med (50mg  one hour before receiving iodinated contrast agent)  . Phenergan [Promethazine] Other (See Comments)    Avoids due to reaction with current medications    Patient Measurements: Height: 5' (152.4 cm) Weight: 140 lb 12.8 oz (63.9 kg) IBW/kg (Calculated) : 45.5  Vital Signs: Temp: 99.1 F (37.3 C) (09/26 0500) Temp Source: Oral (09/26 0500) BP: 120/47 (09/26 0500) Pulse Rate: 74 (09/26 0500)  Labs:  Recent Labs  09/19/16 0550 09/19/16 1149 09/19/16 1827 09/20/16 0600 09/21/16 0404  HGB 11.6*  --   --  9.7* 9.4*  HCT 35.2*  --   --  29.5* 28.9*  PLT 160  --   --  142* 161  LABPROT 17.6*  --   --   --  27.6*  INR 1.43  --   --   --  2.52  CREATININE 1.55*  --   --  1.60*  --   TROPONINI  --  0.37* 0.27*  --   --     Estimated Creatinine Clearance: 25 mL/min (by C-G formula based on SCr of 1.6 mg/dL (H)).   Medications:  Prescriptions Prior to Admission  Medication Sig Dispense Refill Last Dose  . calcium citrate-vitamin D (CITRACAL+D) 315-200 MG-UNIT per tablet Take 1 tablet by mouth 3 (three) times daily.   09/18/2016 at Unknown time  . carbidopa-levodopa (SINEMET IR) 25-100 MG tablet Take 2 tablets by mouth 3 (three) times daily. 540 tablet 1 09/18/2016 at Unknown time  . Cholecalciferol (VITAMIN D3) 2000 units TABS Take 1 capsule by mouth daily.   09/18/2016 at Unknown time  . enoxaparin (LOVENOX) 80 MG/0.8ML injection Inject 80 mg into the skin 2 (two) times daily.    09/18/2016 at 1815  . famotidine-calcium carbonate-magnesium hydroxide (PEPCID COMPLETE) 10-800-165 MG CHEW chewable  tablet Chew 1 tablet by mouth daily as needed (heartburn).   09/18/2016 at Unknown time  . Multiple Vitamin (MULTIVITAMIN) tablet Take 1 tablet by mouth daily at 12 noon.    09/18/2016 at Unknown time  . Multiple Vitamins-Minerals (VISION-VITE PRESERVE PO) Take 1 tablet by mouth 2 (two) times daily.   09/18/2016 at Unknown time  . oxybutynin (DITROPAN) 5 MG tablet Take 2.5 mg by mouth 2 (two) times daily.    09/18/2016 at Unknown time  . pantoprazole (PROTONIX) 40 MG tablet Take 40 mg by mouth every morning.    09/18/2016 at Unknown time  . phenazopyridine (PYRIDIUM) 200 MG tablet Take 1 tablet (200 mg total) by mouth 3 (three) times daily as needed for pain. 10 tablet 0 09/18/2016 at Unknown time  . simvastatin (ZOCOR) 20 MG tablet Take 20 mg by mouth every evening.   09/18/2016 at Unknown time  . triamterene-hydrochlorothiazide (MAXZIDE-25) 37.5-25 MG per tablet Take 1 tablet by mouth every morning.    09/18/2016 at Unknown time  . UNABLE TO FIND Take 900 mg by mouth daily at 12 noon. Omega Red    09/18/2016 at Unknown time  . vitamin B-12 (CYANOCOBALAMIN) 1000 MCG tablet Take 1,000 mcg by mouth every morning.   09/18/2016 at Unknown time  . warfarin (COUMADIN) 5  MG tablet Take 5 mg by mouth every evening. 7.5 mg every Tues, Thurs, Sat; 5 mg all other days   09/18/2016 at 1800   Scheduled:  . bisacodyl  10 mg Rectal Once  . carbidopa-levodopa  2 tablet Oral TID  . cefTRIAXone (ROCEPHIN)  IV  1 g Intravenous Q24H  . enoxaparin (LOVENOX) injection  1 mg/kg Subcutaneous Q24H  . oxybutynin  2.5 mg Oral BID  . pantoprazole  40 mg Oral Daily  . polyethylene glycol  17 g Oral Daily  . regadenoson  0.4 mg Intravenous Once  . senna  1 tablet Oral BID  . simvastatin  20 mg Oral QPM  . sodium chloride flush  3 mL Intravenous Q12H  . triamterene-hydrochlorothiazide  1 tablet Oral Daily  . Warfarin - Pharmacist Dosing Inpatient   Does not apply q1800    Assessment: 75 yo female her with abdominal pain and  noted  s/p urteral calculus removal on 9/22. She has a history of AVR and afib on coumadin and pharmacy consulted to dose coumadin. Lovenox has been adjusted to once daily with CrCl ~ 25.  -INR= 2.52 (up from 1.43 on 9/24) -hg trend 11.6>>9.7>>9.4   PTA regimen was enoxaparin 80mg  [place] bid and warfarin 7.5mg  TuThSa, 5mg  MWFSu  Goal of Therapy:  INR goal 2.5-3.5 Monitor platelets by anticoagulation protocol: Yes   Plan:  -Consider one more day of lovenox to assure that INR remains at goal -Coumadin 2.5mg  today -Daily PT/INR  Hildred Laser, Pharm D 09/21/2016 8:55 AM

## 2016-09-21 NOTE — Progress Notes (Signed)
   Subjective: Patient was evaluated after her Myoview. She was laying comfortably in bed. She denies any nausea vomiting or abdominal pain.  Objective:  Vital signs in last 24 hours: Vitals:   09/21/16 0939 09/21/16 0941 09/21/16 0943 09/21/16 0944  BP: (!) 99/56 111/60 116/60 125/62  Pulse: 92 (!) 106 96 95  Resp:      Temp:      TempSrc:      SpO2:      Weight:      Height:       Physical Exam  Constitutional: She is well-developed, well-nourished, and in no distress.  Cardiovascular: Normal rate and regular rhythm.  Exam reveals no gallop and no friction rub.   No murmur heard. Systolic click   Pulmonary/Chest: Effort normal. No respiratory distress.  Clear to auscultation bilaterally with no wheezes, rhonchi,  rales  Abdominal: Soft. She exhibits no distension. There is no tenderness.  Neurological: She is alert.    Assessment/Plan:  Principal Problem:   UTI (urinary tract infection) Active Problems:   GERD   S/P AVR (aortic valve replacement)   HTN (hypertension)   Hypercholesteremia   Atrial fibrillation with RVR (HCC)   Hiatal hernia   Elevated troponin   Emesis  Urinary Tract Infection Patient is afebrile with no leukocytosis. She denies suprapubic or lower abdominal pain. No tenderness on exam. She denies any nausea and vomiting. Patient is on IV ceftriaxone and will be switched to oral medications for discharge. - Bactrim on discharge  Atrial Fibrillation Currently in sinus rhythm.  Patient had myoview today that showed no evidence of reversible ischemia. -Continue warfarin and Lovenox bridge - appreciate cardiology's recommendations    Hypertension BP 125/62 stable - Continue triamterene-hydrochlorothiazide 37.5-25 MG  Hyperlipidemia -Continue home Simvastatin 20mg  daily  Dispo: Anticipated discharge today pending cardiology recommendation.   Valinda Party, DO 09/21/2016, 1:01 PM Pager: [phone]

## 2016-09-23 DIAGNOSIS — N201 Calculus of ureter: Secondary | ICD-10-CM | POA: Diagnosis not present

## 2016-09-23 DIAGNOSIS — Z7901 Long term (current) use of anticoagulants: Secondary | ICD-10-CM | POA: Diagnosis not present

## 2016-09-23 DIAGNOSIS — I358 Other nonrheumatic aortic valve disorders: Secondary | ICD-10-CM | POA: Diagnosis not present

## 2016-09-28 DIAGNOSIS — Z7901 Long term (current) use of anticoagulants: Secondary | ICD-10-CM | POA: Diagnosis not present

## 2016-09-28 DIAGNOSIS — I358 Other nonrheumatic aortic valve disorders: Secondary | ICD-10-CM | POA: Diagnosis not present

## 2016-10-04 ENCOUNTER — Ambulatory Visit (INDEPENDENT_AMBULATORY_CARE_PROVIDER_SITE_OTHER): Payer: PPO | Admitting: Nurse Practitioner

## 2016-10-04 ENCOUNTER — Encounter: Payer: Self-pay | Admitting: Nurse Practitioner

## 2016-10-04 VITALS — BP 124/72 | HR 70 | Ht 59.75 in | Wt 136.5 lb

## 2016-10-04 DIAGNOSIS — Z952 Presence of prosthetic heart valve: Secondary | ICD-10-CM

## 2016-10-04 DIAGNOSIS — I495 Sick sinus syndrome: Secondary | ICD-10-CM

## 2016-10-04 DIAGNOSIS — I480 Paroxysmal atrial fibrillation: Secondary | ICD-10-CM

## 2016-10-04 DIAGNOSIS — I10 Essential (primary) hypertension: Secondary | ICD-10-CM

## 2016-10-04 NOTE — Patient Instructions (Signed)
Your physician recommends that you schedule a follow-up appointment in: DR Jolyn Nap

## 2016-10-04 NOTE — Progress Notes (Signed)
Office Visit    Patient Name: [name] Date of Encounter: 10/04/2016  Primary Care Provider:  Donnajean Lopes, MD Primary Cardiologist:  P. Martinique, MD   Chief Complaint    75 y/o ? with a h/o severe AS s/p SJM AVR and PAF s/p recent hospitalization related to PAF and elevated troponin, who presents for f/u.  Past Medical History    Past Medical History:  Diagnosis Date  . Anticoagulated on Coumadin   . Arthropathy of cervical facet joint    C2-3  . At risk for falls   . BPV (benign positional vertigo)   . Bruises easily   . Cancer ([place]) 2004    breast-lumpectomy,nodes ,radiation,chemo  . Chronic neck pain   . CKD (chronic kidney disease), stage II   . Diverticulosis of colon   . Elevated troponin    a. 08/2016 in setting of PAF;  b. 08/2016 low risk MV w/ fixed septal defect (LBBB), EF 53%.  Marland Kitchen GERD (gastroesophageal reflux disease)   . Hiatal hernia   . History of aortic valve stenosis   . History of breast cancer per pt no recurrence   dx 2004-- Left breast DCIS (ER/PR+, HER2 negative) s/p  partial masectomy w/ sln bx and  chemoradiotion (completed 2004)  . History of colon polyps   . History of herpes zoster   . History of kidney stones   . History of kidney stones   . HTN (hypertension)   . Hyperlipidemia   . Hypertrophy of inter-atrial septum    a. 09/2014 Echo: ? of atrial mass; b. 10/2014 Cardiac MRI: no atrial septal mass - polypoid lipomatous hypertrophy of superior atrial septum noted-->Benign.  . Irritable bowel syndrome   . LBBB (left bundle branch block)   . PAF (paroxysmal atrial fibrillation) (HCC)    a. CHA2DS2VASc = 4-->coumadin.  . Parkinsonism [facility])    neurologist-  dr tat  . PONV (postoperative nausea and vomiting)   . PVC's (premature ventricular contractions)   . Right ureteral stone   . S/P aortic valve replacement with prosthetic valve    a. 04/13/2006 s/p St Jude mechnical AVR for severe AS;  b. 09/2014 Echo: EF 40-45%, gr1 DD,  mild AI/MR, triv TR/PR.  . SUI (stress urinary incontinence, female)   . Unstable balance    uses cane  . Venous varices     Past Surgical History:  Procedure Laterality Date  . ANTERIOR AND POSTERIOR REPAIR  1990   cystocele/ rectocele  . AORTIC VALVE REPLACEMENT  04/13/06   dr gerhardt   and Replacement Ascending Aorta (St. Jude mechanical prosthesis)  . APPENDECTOMY  child 1950  . BREAST SURGERY Left 2004  . CARDIAC CATHETERIZATION  02-22-2001   dr Martinique   minimal non-obstructive cad/  mild aortic stenosis and aortic root enlargement/  normal LVF, ef 65%  . CARDIAC CATHETERIZATION  03-22-2006    dr Martinique   no significant obstructive cad/  severe aortic stenosis and mild to moderate aortic root enlargement/  upper normal right heart pressures  . CARDIOVASCULAR STRESS TEST  09-30-2011   dr Martinique   lexiscan nuclear study w/ apical thinning  vs  small prior apical infarct w/ no significant ischemia/  hypokinesis of the distal septum and apex, ef 50%  . CARPAL TUNNEL RELEASE Bilateral left 09-09-2004/  right 2007  . CATARACT EXTRACTION W/ INTRAOCULAR LENS  IMPLANT, BILATERAL  2015  . COLONOSCOPY N/A 10/03/2014   Procedure: COLONOSCOPY;  Surgeon: Glendell Docker  Simonne Maffucci, MD;  Location: [place];  Service: Endoscopy;  Laterality: N/A;  . CYSTOSCOPY WITH RETROGRADE PYELOGRAM, URETEROSCOPY AND STENT PLACEMENT Right 09/17/2016   Procedure: CYSTOSCOPY WITH RIGHT RETROGRADE PYELOGRAM, RIGHT URETEROSCOPY AND STENT PLACEMENT LASER LITHOTRIPSY, RIGHT STENT PLACEMENT;  Surgeon: [name], MD;  Location: [facility];  Service: Urology;  Laterality: Right;  . ESOPHAGEAL MANOMETRY N/A 08/12/2014   Procedure: ESOPHAGEAL MANOMETRY (EM);  Surgeon: [name], MD;  Location: [facility];  Service: Endoscopy;  Laterality: N/A;  . ESOPHAGOGASTRODUODENOSCOPY N/A 10/03/2014   Procedure: ESOPHAGOGASTRODUODENOSCOPY (EGD);  Surgeon: [name], MD;  Location: [facility];  Service:  Endoscopy;  Laterality: N/A;  . [place]  . FOOT SURGERY Right 2013   hammertoe x2  . HOLMIUM LASER APPLICATION Right 1/44/3154   Procedure: HOLMIUM LASER APPLICATION;  Surgeon: [name], MD;  Location: [facility];  Service: Urology;  Laterality: Right;  . PERCUTANEOUS NEPHROSTOLITHOTOMY  1993  . STONE EXTRACTION WITH BASKET Right 09/17/2016   Procedure: STONE EXTRACTION WITH BASKET;  Surgeon: [name], MD;  Location: [facility];  Service: Urology;  Laterality: Right;  . TOTAL ABDOMINAL HYSTERECTOMY  1971   w/ Bilateral Salpingoophorectomy  . TRANSTHORACIC ECHOCARDIOGRAM  10/24/2014   dr Martinique   hypokinesis of the basal and mid inferoseptal and inferior walls,  ef 40-45%,  grade 1 diastolic dysfunction/  mechanical bileaflet aortic valve noted w/ mild regur., peak grandient 49mHg, valve area 1.35cm^2/  severe MV calcification without stenosis w/ mild regurg., peak gradient 362mg/  mild LAE/  poss. atrial mass (MRI showed benign lipomatous hypertrophy of atrial septum) /tFrazier ButtR/mild TR    Allergies  Allergies  Allergen Reactions  . Demerol [Meperidine] Shortness Of Breath and Swelling  . Oxycodone Other (See Comments)    Hallucinations   . Adhesive [Tape] Other (See Comments)    Redness and skin tears  . Ivp Dye [Iodinated Diagnostic Agents] Hives    OK with benadryl pre-med (5060mne hour before receiving iodinated contrast agent)  . Phenergan [Promethazine] Other (See Comments)    Avoids due to reaction with current medications    History of Present Illness    75 34o ? with a h/o severe AS s/p SJM mech AVR in 2007. She also has a h/o PAF and is chronically anticoagulated with coumadin.  She was last seen by Dr. JorMartinique late September, just prior to planned ureteral stenting.  At that time, she was noted to be bradycardic and her  blocker was d/c'd.  She underwent her urologic procedure the  following day and tolerated it well.  Unfortunately, on 9/23, she developed abd pain, nausea, c/p, sob, and palpitations, prompting her to call EMS.  She was found to be in AF with RVR, which later converted to sinus rhythm.  [facility].  She was seen by our team and underwent stress testing, which was low risk.  She was seen by Dr. KleCaryl Comesring the admission and it was felt that in the setting of tachy-brady, she would likely require a pacer in the near future.  The remainder of her hospitalization was uneventful and since d/c, she has done reasonably well.  She has not been having any chest pain or dyspnea, but has had one episode of palpitations lasting about twenty mintues with a max HR of about 100. She thinks this was likely afib.  She  denies pnd, orthopnea, n, v, dizziness, syncope, edema, weight gain, or early satiety.   Home Medications    Prior to Admission medications   Medication Sig Start Date End Date Taking? Authorizing Provider  amoxicillin-clavulanate (AUGMENTIN) 500-125 MG tablet Take 1 tablet (500 mg total) by mouth 2 (two) times daily. 09/21/16   Florinda Marker, MD  calcium citrate-vitamin D (CITRACAL+D) 315-200 MG-UNIT per tablet Take 1 tablet by mouth 3 (three) times daily.    Historical Provider, MD  carbidopa-levodopa (SINEMET IR) 25-100 MG tablet Take 2 tablets by mouth 3 (three) times daily. 07/06/16   Eustace Quail Tat, DO  Cholecalciferol (VITAMIN D3) 2000 units TABS Take 1 capsule by mouth daily.    Historical Provider, MD  enoxaparin (LOVENOX) 80 MG/0.8ML injection Inject 80 mg into the skin 2 (two) times daily.  09/10/16   Historical Provider, MD  famotidine-calcium carbonate-magnesium hydroxide (PEPCID COMPLETE) 10-800-165 MG CHEW chewable tablet Chew 1 tablet by mouth daily as needed (heartburn).    Historical Provider, MD  Multiple Vitamin (MULTIVITAMIN) tablet Take 1 tablet by mouth daily at 12 noon.     Historical Provider, MD    Multiple Vitamins-Minerals (VISION-VITE PRESERVE PO) Take 1 tablet by mouth 2 (two) times daily.    Historical Provider, MD  oxybutynin (DITROPAN) 5 MG tablet Take 2.5 mg by mouth 2 (two) times daily.     Historical Provider, MD  pantoprazole (PROTONIX) 40 MG tablet Take 40 mg by mouth every morning.     Historical Provider, MD  phenazopyridine (PYRIDIUM) 200 MG tablet Take 1 tablet (200 mg total) by mouth 3 (three) times daily as needed for pain. 09/17/16   [name], MD  simvastatin (ZOCOR) 20 MG tablet Take 20 mg by mouth every evening.    Historical Provider, MD  sulfamethoxazole-trimethoprim (BACTRIM DS) 800-160 MG tablet Take 1 tablet by mouth daily. 09/21/16   Jessica Ratliff Hoffman, DO  triamterene-hydrochlorothiazide (MAXZIDE-25) 37.5-25 MG per tablet Take 1 tablet by mouth every morning.     Historical Provider, MD  UNABLE TO FIND Take 900 mg by mouth daily at 12 noon. Omega Red     Historical Provider, MD  vitamin B-12 (CYANOCOBALAMIN) 1000 MCG tablet Take 1,000 mcg by mouth every morning.    Historical Provider, MD  warfarin (COUMADIN) 2.5 MG tablet Take 1 tablet (2.5 mg total) by mouth one time only at 6 PM. 09/21/16   Valinda Party, DO  warfarin (COUMADIN) 5 MG tablet Take 5 mg by mouth every evening. 7.5 mg every Tues, Thurs, Sat; 5 mg all other days    Historical Provider, MD    Review of Systems    As above, doing well since d/c.  She has had one episode of palpitations lasting twenty minutes but otw She denies chest pain, dyspnea, pnd, orthopnea, n, v, dizziness, syncope, edema, weight gain, or early satiety.  All other systems reviewed and are otherwise negative except as noted above.  Physical Exam    VS:  BP 124/72   Pulse 70   Ht 4' 11.75" (1.518 m)   Wt 136 lb 8 oz (61.9 kg)   BMI 26.88 kg/m  , BMI Body mass index is 26.88 kg/m. GEN: Well nourished, well developed, in no acute distress.  HEENT: normal.  Neck: Supple, no JVD, carotid bruits, or  masses. Cardiac: RRR, 2/6 syst murmur @ bilat USB with S2 click, no rubs, or gallops. No clubbing, cyanosis, edema.  Radials/DP/PT 2+ and equal bilaterally.  Respiratory:  Respirations regular and unlabored, clear to auscultation bilaterally. GI: Soft, nontender, nondistended, BS + x 4. MS: no deformity or atrophy. Skin: warm and dry, no rash. Neuro:  Strength and sensation are intact. Psych: Normal affect.  Accessory Clinical Findings    ECG - RSR, 70, LBBB, no acute changes.  Assessment & Plan    1.  PAF/Tachy-brady syndrome:  Pt has a h/o PAF, previously managed with metoprolol xl 25 mg daily, however this was stopped due to significant sinus bradycardia with rates in the 40's @ office visit on 9/21.  She developed AFib on 9/23, prompting presentation to [facility].  She converted spontaneously to sinus.  She was seen by Dr. Caryl Comes who felt that she would very likely require pacing in the near future.  Since her d/c, she notes experiencing one episode of palpitations, lasting about 20 mins, and resolving spontaneously.  Rate was around 100.  She is anticoagulated on coumadin in the setting of AVR.  We did discuss tachy-brady syndrome at length, and I will refer back to Dr. Caryl Comes to continue the conversation and schedule PPM if appropriate.  2.  S/P SJM AVR:  Stable on echo in 2015.  Chronic coumadin.  3.  Elevated troponin:  Pt had c/p and mild trop elevation in setting of AFib and abd pain during recent hospitalization.  MV was low risk and neg for ischemia.  No recurrent c/p.  No further w/u @ this time.  4.  Essential HTN:  Stable.  5.  HL:  Cont statin.  6.  Dispo:  F/u with Dr. Caryl Comes re: tachy-brady and role of pacing.   Murray Hodgkins, NP 10/04/2016, 1:59 PM

## 2016-10-06 ENCOUNTER — Encounter: Payer: Self-pay | Admitting: Internal Medicine

## 2016-10-07 ENCOUNTER — Ambulatory Visit (INDEPENDENT_AMBULATORY_CARE_PROVIDER_SITE_OTHER): Payer: PPO | Admitting: Internal Medicine

## 2016-10-07 VITALS — BP 134/74 | HR 75 | Ht <= 58 in | Wt 139.0 lb

## 2016-10-07 DIAGNOSIS — I495 Sick sinus syndrome: Secondary | ICD-10-CM

## 2016-10-07 DIAGNOSIS — I480 Paroxysmal atrial fibrillation: Secondary | ICD-10-CM | POA: Diagnosis not present

## 2016-10-07 NOTE — Progress Notes (Signed)
Patient Care Team: Leanna Battles, MD as PCP - General   HPI  [name] is a 75 y.o. female Seen in follow-up after she was evaluated in the hospital 9/17 for episode of atrial fibrillation with a rapid rate occurring in the context of nephrolithiasis  She has a history of aortic valve replacement with a St. Jude mechanical valve  She also was noted to have sinus bradycardia with heart rates into the 40s and previously prompted the discontinuation of beta blockers. She also has underlying left bundle branch block  Records and Results Reviewed echo report 10/15 EF 40-45% DATE TEST    10/15    echo   EF 40-45 %   10/15/     MRI   EF 56* % LIPOMATOUS hypertrophy of the intraatrial septum  9/17 myoview   EF 53%   NO ISCHEMIA   She's had some intermittent episodes of tachypalpitations presumably atrial fibrillation. It is associated with shortness of breath and lightheadedness. These antedate her urological procedures. They typically last 20 minutes and abate on their own  Myoview 9/17  With no evidence of reversible ischemia  Past Medical History:  Diagnosis Date  . Anticoagulated on Coumadin   . Arthropathy of cervical facet joint    C2-3  . At risk for falls   . BPV (benign positional vertigo)   . Bruises easily   . Cancer ([place]) 2004    breast-lumpectomy,nodes ,radiation,chemo  . Chronic neck pain   . CKD (chronic kidney disease), stage II   . Diverticulosis of colon   . Elevated troponin    a. 08/2016 in setting of PAF;  b. 08/2016 low risk MV w/ fixed septal defect (LBBB), EF 53%.  Marland Kitchen GERD (gastroesophageal reflux disease)   . Hiatal hernia   . History of aortic valve stenosis   . History of breast cancer per pt no recurrence   dx 2004-- Left breast DCIS (ER/PR+, HER2 negative) s/p  partial masectomy w/ sln bx and  chemoradiotion (completed 2004)  . History of colon polyps   . History of herpes zoster   . History of kidney stones   . History of kidney  stones   . HTN (hypertension)   . Hyperlipidemia   . Hypertrophy of inter-atrial septum    a. 09/2014 Echo: ? of atrial mass; b. 10/2014 Cardiac MRI: no atrial septal mass - polypoid lipomatous hypertrophy of superior atrial septum noted-->Benign.  . Irritable bowel syndrome   . LBBB (left bundle branch block)   . PAF (paroxysmal atrial fibrillation) (HCC)    a. CHA2DS2VASc = 4-->coumadin.  . Parkinsonism [facility])    neurologist-  dr tat  . PONV (postoperative nausea and vomiting)   . PVC's (premature ventricular contractions)   . Right ureteral stone   . S/P aortic valve replacement with prosthetic valve    a. 04/13/2006 s/p St Jude mechnical AVR for severe AS;  b. 09/2014 Echo: EF 40-45%, gr1 DD, mild AI/MR, triv TR/PR.  . SUI (stress urinary incontinence, female)   . Unstable balance    uses cane  . Venous varices      Past Surgical History:  Procedure Laterality Date  . ANTERIOR AND POSTERIOR REPAIR  1990   cystocele/ rectocele  . AORTIC VALVE REPLACEMENT  04/13/06   dr gerhardt   and Replacement Ascending Aorta (St. Jude mechanical prosthesis)  . APPENDECTOMY  child 1950  . BREAST SURGERY Left 2004  . CARDIAC CATHETERIZATION  02-22-2001  dr Martinique   minimal non-obstructive cad/  mild aortic stenosis and aortic root enlargement/  normal LVF, ef 65%  . CARDIAC CATHETERIZATION  03-22-2006    dr Martinique   no significant obstructive cad/  severe aortic stenosis and mild to moderate aortic root enlargement/  upper normal right heart pressures  . CARDIOVASCULAR STRESS TEST  09-30-2011   dr Martinique   lexiscan nuclear study w/ apical thinning  vs  small prior apical infarct w/ no significant ischemia/  hypokinesis of the distal septum and apex, ef 50%  . CARPAL TUNNEL RELEASE Bilateral left 09-09-2004/  right 2007  . CATARACT EXTRACTION W/ INTRAOCULAR LENS  IMPLANT, BILATERAL  2015  . COLONOSCOPY N/A 10/03/2014   Procedure: COLONOSCOPY;  Surgeon: [name], MD;  Location: [place];  Service: Endoscopy;  Laterality: N/A;  . CYSTOSCOPY WITH RETROGRADE PYELOGRAM, URETEROSCOPY AND STENT PLACEMENT Right 09/17/2016   Procedure: CYSTOSCOPY WITH RIGHT RETROGRADE PYELOGRAM, RIGHT URETEROSCOPY AND STENT PLACEMENT LASER LITHOTRIPSY, RIGHT STENT PLACEMENT;  Surgeon: [name], MD;  Location: [facility];  Service: Urology;  Laterality: Right;  . ESOPHAGEAL MANOMETRY N/A 08/12/2014   Procedure: ESOPHAGEAL MANOMETRY (EM);  Surgeon: [name], MD;  Location: [facility];  Service: Endoscopy;  Laterality: N/A;  . ESOPHAGOGASTRODUODENOSCOPY N/A 10/03/2014   Procedure: ESOPHAGOGASTRODUODENOSCOPY (EGD);  Surgeon: [name], MD;  Location: [facility];  Service: Endoscopy;  Laterality: N/A;  . [place]  . FOOT SURGERY Right 2013   hammertoe x2  . HOLMIUM LASER APPLICATION Right 6/38/4536   Procedure: HOLMIUM LASER APPLICATION;  Surgeon: [name], MD;  Location: [facility];  Service: Urology;  Laterality: Right;  . PERCUTANEOUS NEPHROSTOLITHOTOMY  1993  . STONE EXTRACTION WITH BASKET Right 09/17/2016   Procedure: STONE EXTRACTION WITH BASKET;  Surgeon: [name], MD;  Location: [facility];  Service: Urology;  Laterality: Right;  . TOTAL ABDOMINAL HYSTERECTOMY  1971   w/ Bilateral Salpingoophorectomy  . TRANSTHORACIC ECHOCARDIOGRAM  10/24/2014   dr Martinique   hypokinesis of the basal and mid inferoseptal and inferior walls,  ef 40-45%,  grade 1 diastolic dysfunction/  mechanical bileaflet aortic valve noted w/ mild regur., peak grandient 3mHg, valve area 1.35cm^2/  severe MV calcification without stenosis w/ mild regurg., peak gradient 312mg/  mild LAE/  poss. atrial mass (MRI showed benign lipomatous hypertrophy of atrial septum) /tFrazier ButtR/mild TR    Current Outpatient Prescriptions  Medication Sig Dispense Refill  . calcium citrate-vitamin D (CITRACAL+D) 315-200  MG-UNIT per tablet Take 1 tablet by mouth 3 (three) times daily.    . carbidopa-levodopa (SINEMET IR) 25-100 MG tablet Take 2 tablets by mouth 3 (three) times daily. 540 tablet 1  . Cholecalciferol (VITAMIN D3) 2000 units TABS Take 1 capsule by mouth daily.    . famotidine-calcium carbonate-magnesium hydroxide (PEPCID COMPLETE) 10-800-165 MG CHEW chewable tablet Chew 1 tablet by mouth daily as needed (heartburn).    . Multiple Vitamin (MULTIVITAMIN) tablet Take 1 tablet by mouth daily at 12 noon.     . Multiple Vitamins-Minerals (VISION-VITE PRESERVE PO) Take 1 tablet by mouth 2 (two) times daily.    . Marland Kitchenxybutynin (DITROPAN) 5 MG tablet Take 2.5 mg by mouth 2 (two) times daily.     . pantoprazole (PROTONIX) 40 MG tablet Take 40 mg by mouth every morning.     . simvastatin (ZOCOR) 20 MG tablet Take 20 mg by mouth every evening.    .Marland Kitchen  triamterene-hydrochlorothiazide (MAXZIDE-25) 37.5-25 MG per tablet Take 1 tablet by mouth every morning.     Marland Kitchen UNABLE TO FIND Take 900 mg by mouth daily at 12 noon. Omega Red     . vitamin B-12 (CYANOCOBALAMIN) 1000 MCG tablet Take 1,000 mcg by mouth every morning.    . warfarin (COUMADIN) 5 MG tablet Take 5 mg by mouth every evening. 7.5 mg every Tues, Thurs, Sat; 5 mg all other days     No current facility-administered medications for this visit.     Allergies  Allergen Reactions  . Demerol [Meperidine] Shortness Of Breath and Swelling  . Oxycodone Other (See Comments)    Hallucinations   . Adhesive [Tape] Other (See Comments)    Redness and skin tears  . Ivp Dye [Iodinated Diagnostic Agents] Hives    OK with benadryl pre-med (62m one hour before receiving iodinated contrast agent)  . Phenergan [Promethazine] Other (See Comments)    Avoids due to reaction with current medications      Review of Systems negative except from HPI and PMH  Physical Exam\ BP 134/74   Pulse 75   Ht _0  (1.448 m)   Wt 139 lb (63 kg)   BMI 30.08 kg/m   Well developed  and well nourished in no acute distress HENT normal E scleral and icterus clear Dr.d about immigration Dr. KLuiz Blare status Neck Supple JVP flat; carotids brisk and full Clear to ausculation  Regular rate and rhythm, mechanical S2 with a 2/6 murmu oft with active bowel sounds No clubbing cyanosis  Edema Alert and oriented, grossly normal motor and sensory function Skin Warm and Dry  ECG demonstrates sinus rhythm at 75 There is a lead placement error.   Assessment and plan  Atrial fibrillation-paroxysmal  Renal insufficiency grade 3  Anemia  Bradycardia-sinus-iatrogenic   The patient has infrequent episodes of atrial fibrillation. They have been attenuated by the use of beta blockers which were recently discontinued because of bradycardia. In the event that her symptoms dictate the need for therapy, 2 alternatives with suggestive cells primarily. The first would be an ASA beta blocker and the second would be a non-bradycardia associated antiarrhythmic probably dofetilide if her renal function were not a problem.  We will have her follow-up with Dr. JMartinique We will see her again as needed. I have reviewed the above with him.  We spent more than 50% of our >25 min visit in face to face counseling regarding the above        Current medicines are reviewed at length with the patient today .  The patient does not  have concerns regarding medicines.

## 2016-10-07 NOTE — Patient Instructions (Addendum)
Medication Instructions:    Your physician recommends that you continue on your current medications as directed. Please refer to the Current Medication list given to you today.  --- If you need a refill on your cardiac medications before your next appointment, please call your pharmacy. ---  Labwork:  None ordered  Testing/Procedures:  None ordered  Follow-Up:  No follow up is needed at this time with Dr. Caryl Comes.  He will see you on an as needed basis.  Follow up with Dr. Martinique   Thank you for choosing [facility]!!   Trinidad Curet, RN [phone]

## 2016-10-12 ENCOUNTER — Telehealth: Payer: Self-pay | Admitting: Neurology

## 2016-10-12 DIAGNOSIS — G231 Progressive supranuclear ophthalmoplegia [Steele-Richardson-Olszewski]: Secondary | ICD-10-CM

## 2016-10-12 NOTE — Telephone Encounter (Signed)
Patient left message on the voicemail and would liek a rx for the walker with a  Seat she did not leave a telephone number to call her back at

## 2016-10-12 NOTE — Telephone Encounter (Signed)
Patient made aware RX written. She will pick up.

## 2016-11-02 DIAGNOSIS — Z7901 Long term (current) use of anticoagulants: Secondary | ICD-10-CM | POA: Diagnosis not present

## 2016-11-02 DIAGNOSIS — I358 Other nonrheumatic aortic valve disorders: Secondary | ICD-10-CM | POA: Diagnosis not present

## 2016-11-09 ENCOUNTER — Ambulatory Visit: Payer: PPO | Admitting: Neurology

## 2016-11-10 DIAGNOSIS — Z6833 Body mass index (BMI) 33.0-33.9, adult: Secondary | ICD-10-CM | POA: Diagnosis not present

## 2016-11-10 DIAGNOSIS — J209 Acute bronchitis, unspecified: Secondary | ICD-10-CM | POA: Diagnosis not present

## 2016-11-15 DIAGNOSIS — Z952 Presence of prosthetic heart valve: Secondary | ICD-10-CM | POA: Diagnosis not present

## 2016-11-15 DIAGNOSIS — J209 Acute bronchitis, unspecified: Secondary | ICD-10-CM | POA: Diagnosis not present

## 2016-11-15 DIAGNOSIS — Z7901 Long term (current) use of anticoagulants: Secondary | ICD-10-CM | POA: Diagnosis not present

## 2016-11-20 NOTE — Progress Notes (Signed)
[name] was seen today in the movement disorders clinic for neurologic consultation at the request of Dr Tomi Likens for a 2nd opinion regarding gait instability.  She is accompanied by her husband who supplements the hx.  The records that were made available to me were reviewed.  Pt reports that she has been having falls, the first of which was several years ago but then she began to have more regular falls 6-8 months ago.  Most falls are when she turns and she notes that the feet will shuffle and the "body will turn and the feet will stay."  Her husband states that her feet will stay like "they are stuck on bumble gum."  No falls in last 2 weeks.  She has been in rehab recently.    07/06/16 update:  The patient is following up today regarding probable PSP.  Went to the atypical symposium I had recommended and they thought that it was very informative.  She had a modified barium swallow on 05/20/2016 that recommended a regular diet with thin liquids.  She has been participating with physical and occupational therapy since last visit.  I started her on levodopa (6am/12noon/5pm) and she has not noticed a great difference with that. No SE.  Has had 2 falls.  With one she tripped over her cane and with one she fell changing her sheets.   This was very low dose, and a benefit was not expected at this low dose.  She did see Dr. Rexene Alberts in 06/22/2016 for sleep apnea.  A sleep study was recommended.  Pt states that she hasn't scheduled that yet.  States that she had an injection in neck for pain 2 weeks ago and thinks that it has helped some.  States that she is contemplating cervical spine surgery.  She did have an MRI of the cervical spine on 06/03/2016 that demonstrated severe C2-C3 left facet arthropathy with joint effusion and bone marrow edema.  11/23/16 update:  Pt f/u today.  Last visit, I increased her carbidopa/levodopa 25/100 to 2 po tid.  She states that she noted no great difference with that.   Multiple falls over the last few weeks (5) for no reason.  One time she tripped over a threshold.  One time she fell in a bathroom.  One time she was working with the christmas tree.  All the falls except one were forward.   Unfortunately, the PSP study that was supposed to be done local had a site change and the closest site was [place], [place].  They also were told that she wasn't a good candidate because of the coumadin.  Had 2 kidney stones since last visit.  On oxybutinin.  Seeing urology this afternoon.  PREVIOUS MEDICATIONS: none to date  ALLERGIES:   Allergies  Allergen Reactions  . Demerol [Meperidine] Shortness Of Breath and Swelling  . Oxycodone Other (See Comments)    Hallucinations   . Adhesive [Tape] Other (See Comments)    Redness and skin tears  . Ivp Dye [Iodinated Diagnostic Agents] Hives    OK with benadryl pre-med (46m one hour before receiving iodinated contrast agent)  . Phenergan [Promethazine] Other (See Comments)    Avoids due to reaction with current medications    CURRENT MEDICATIONS:  Outpatient Encounter Prescriptions as of 11/23/2016  Medication Sig  . calcium citrate-vitamin D (CITRACAL+D) 315-200 MG-UNIT per tablet Take 1 tablet by mouth 3 (three) times daily.  . carbidopa-levodopa (SINEMET IR) 25-100 MG tablet Take 2  tablets by mouth 3 (three) times daily.  . Cholecalciferol (VITAMIN D3) 2000 units TABS Take 1 capsule by mouth daily.  . famotidine-calcium carbonate-magnesium hydroxide (PEPCID COMPLETE) 10-800-165 MG CHEW chewable tablet Chew 1 tablet by mouth daily as needed (heartburn).  . Multiple Vitamin (MULTIVITAMIN) tablet Take 1 tablet by mouth daily at 12 noon.   . Multiple Vitamins-Minerals (VISION-VITE PRESERVE PO) Take 1 tablet by mouth 2 (two) times daily.  Marland Kitchen oxybutynin (DITROPAN) 5 MG tablet Take 2.5 mg by mouth 2 (two) times daily.   . pantoprazole (PROTONIX) 40 MG tablet Take 40 mg by mouth every morning.   . simvastatin (ZOCOR) 20 MG  tablet Take 20 mg by mouth every evening.  . triamterene-hydrochlorothiazide (MAXZIDE-25) 37.5-25 MG per tablet Take 1 tablet by mouth every morning.   Marland Kitchen UNABLE TO FIND Take 900 mg by mouth daily at 12 noon. Omega Red   . vitamin B-12 (CYANOCOBALAMIN) 1000 MCG tablet Take 1,000 mcg by mouth every morning.  . warfarin (COUMADIN) 5 MG tablet Take 5 mg by mouth every evening. 7.5 mg every Tues, Thurs, Sat; 5 mg all other days   No facility-administered encounter medications on file as of 11/23/2016.     PAST MEDICAL HISTORY:   Past Medical History:  Diagnosis Date  . Anticoagulated on Coumadin   . Arthropathy of cervical facet joint    C2-3  . At risk for falls   . BPV (benign positional vertigo)   . Bruises easily   . Cancer ([place]) 2004    breast-lumpectomy,nodes ,radiation,chemo  . Chronic neck pain   . CKD (chronic kidney disease), stage II   . Diverticulosis of colon   . Elevated troponin    a. 08/2016 in setting of PAF;  b. 08/2016 low risk MV w/ fixed septal defect (LBBB), EF 53%.  Marland Kitchen GERD (gastroesophageal reflux disease)   . Hiatal hernia   . History of aortic valve stenosis   . History of breast cancer per pt no recurrence   dx 2004-- Left breast DCIS (ER/PR+, HER2 negative) s/p  partial masectomy w/ sln bx and  chemoradiotion (completed 2004)  . History of colon polyps   . History of herpes zoster   . History of kidney stones   . History of kidney stones   . HTN (hypertension)   . Hyperlipidemia   . Hypertrophy of inter-atrial septum    a. 09/2014 Echo: ? of atrial mass; b. 10/2014 Cardiac MRI: no atrial septal mass - polypoid lipomatous hypertrophy of superior atrial septum noted-->Benign.  . Irritable bowel syndrome   . LBBB (left bundle branch block)   . PAF (paroxysmal atrial fibrillation) (HCC)    a. CHA2DS2VASc = 4-->coumadin.  . Parkinsonism [facility])    neurologist-  dr Malahki Gasaway  . PONV (postoperative nausea and vomiting)   . PVC's (premature ventricular contractions)     . Right ureteral stone   . S/P aortic valve replacement with prosthetic valve    a. 04/13/2006 s/p St Jude mechnical AVR for severe AS;  b. 09/2014 Echo: EF 40-45%, gr1 DD, mild AI/MR, triv TR/PR.  . SUI (stress urinary incontinence, female)   . Unstable balance    uses cane  . Venous varices      PAST SURGICAL HISTORY:   Past Surgical History:  Procedure Laterality Date  . ANTERIOR AND POSTERIOR REPAIR  1990   cystocele/ rectocele  . AORTIC VALVE REPLACEMENT  04/13/06   dr gerhardt   and Replacement Ascending Aorta (St. Jude  mechanical prosthesis)  . APPENDECTOMY  child 1950  . BREAST SURGERY Left 2004  . CARDIAC CATHETERIZATION  02-22-2001   dr Martinique   minimal non-obstructive cad/  mild aortic stenosis and aortic root enlargement/  normal LVF, ef 65%  . CARDIAC CATHETERIZATION  03-22-2006    dr Martinique   no significant obstructive cad/  severe aortic stenosis and mild to moderate aortic root enlargement/  upper normal right heart pressures  . CARDIOVASCULAR STRESS TEST  09-30-2011   dr Martinique   lexiscan nuclear study w/ apical thinning  vs  small prior apical infarct w/ no significant ischemia/  hypokinesis of the distal septum and apex, ef 50%  . CARPAL TUNNEL RELEASE Bilateral left 09-09-2004/  right 2007  . CATARACT EXTRACTION W/ INTRAOCULAR LENS  IMPLANT, BILATERAL  2015  . COLONOSCOPY N/A 10/03/2014   Procedure: COLONOSCOPY;  Surgeon: [name], MD;  Location: [facility];  Service: Endoscopy;  Laterality: N/A;  . CYSTOSCOPY WITH RETROGRADE PYELOGRAM, URETEROSCOPY AND STENT PLACEMENT Right 09/17/2016   Procedure: CYSTOSCOPY WITH RIGHT RETROGRADE PYELOGRAM, RIGHT URETEROSCOPY AND STENT PLACEMENT LASER LITHOTRIPSY, RIGHT STENT PLACEMENT;  Surgeon: [name], MD;  Location[facility];  Service: Urology;  Laterality: Right;  . ESOPHAGEAL MANOMETRY N/A 08/12/2014   Procedure: ESOPHAGEAL MANOMETRY (EM);  Surgeon: [name], MD;  Location: [facility]  ENDOSCOPY;  Service: Endoscopy;  Laterality: N/A;  . ESOPHAGOGASTRODUODENOSCOPY N/A 10/03/2014   Procedure: ESOPHAGOGASTRODUODENOSCOPY (EGD);  Surgeon: [name], MD;  Location[facility];  Service: Endoscopy;  Laterality: N/A;  . [place]  . FOOT SURGERY Right 2013   hammertoe x2  . HOLMIUM LASER APPLICATION Right 3/71/0626   Procedure: HOLMIUM LASER APPLICATION;  Surgeon: [name], MD;  Location: [facility];  Service: Urology;  Laterality: Right;  . PERCUTANEOUS NEPHROSTOLITHOTOMY  1993  . STONE EXTRACTION WITH BASKET Right 09/17/2016   Procedure: STONE EXTRACTION WITH BASKET;  Surgeon: [name], MD;  Location: [facility];  Service: Urology;  Laterality: Right;  . TOTAL ABDOMINAL HYSTERECTOMY  1971   w/ Bilateral Salpingoophorectomy  . TRANSTHORACIC ECHOCARDIOGRAM  10/24/2014   dr Martinique   hypokinesis of the basal and mid inferoseptal and inferior walls,  ef 40-45%,  grade 1 diastolic dysfunction/  mechanical bileaflet aortic valve noted w/ mild regur., peak grandient 71mHg, valve area 1.35cm^2/  severe MV calcification without stenosis w/ mild regurg., peak gradient 360mg/  mild LAE/  poss. atrial mass (MRI showed benign lipomatous hypertrophy of atrial septum) /tFrazier ButtR/mild TR    SOCIAL HISTORY:   Social History   Social History  . Marital status: Married    Spouse name: N/A  . Number of children: 3  . Years of education: 10   Occupational History  . bakery/caterer Other    retired   Social History Main Topics  . Smoking status: Never Smoker  . Smokeless tobacco: Never Used  . Alcohol use No  . Drug use: No  . Sexual activity: Not on file   Other Topics Concern  . Not on file   Social History Narrative      1/2 -1 soda a day      FAMILY HISTORY:   Family Status  Relation Status  . Mother Deceased at age 76. Father Deceased at age 76. Brother Deceased   colon  cancer  . Brother Deceased   accident  . Brother Deceased  accident  . Brother Alive   colon cancer  . Brother Other  . Sister Alive  . Sister Alive  . Sister Alive  . Maternal Grandmother Deceased  . Maternal Grandfather Deceased  . Paternal Grandmother Deceased  . Paternal Grandfather Deceased  . Brother     ROS:  A complete 10 system review of systems was obtained and was unremarkable apart from what is mentioned above.  PHYSICAL EXAMINATION:    VITALS:   Vitals:   11/23/16 0900  BP: 120/70  Pulse: (!) 59  Weight: 135 lb (61.2 kg)  Height: 5' (1.524 m)    GEN:  The patient appears stated age and is in NAD. HEENT:  Normocephalic, atraumatic.  The mucous membranes are moist. The superficial temporal arteries are without ropiness or tenderness. CV:  Mildly bradycardic with regular rhythm with 3/6 SEM Lungs:  CTAB Neck/HEME:  There are no carotid bruits bilaterally.  Head is less turned today than in the past.  No geste antagoniste. Head is not flexed today  Neurological examination:  Orientation: The patient is alert and oriented x3.  Cranial nerves: There is good facial symmetry.  There is facial hypomimia.   There is rare L hemifacial spasm. Extraocular muscles are intact. No square wave jerks.  The visual fields are full to confrontational testing. The speech is fluent and clear. She is hypophonic.  Minimal trouble with gutteral sounds.  Soft palate rises symmetrically and there is no tongue deviation. Hearing is intact to conversational tone. Sensation: Sensation is intact to light touch throughout. Motor: Strength is 5/5 in the bilateral upper and lower extremities.   Shoulder shrug is equal and symmetric.  There is no pronator drift.   Movement examination: Tone: There is normal tone in the bilateral upper extremities.  The tone in the lower extremities is normal.  Abnormal movements: no tremor today Coordination:  There is decremation with RAM's, seen with heel  taps/toe taps R more than L Gait and Station: The patient has trouble getting out of the chair but is able to do it on the 3rd attempt.  The patient's stride length is improved with improved stride length.  She turns en bloc.  The patient has a negative pull test.      ASSESSMENT/PLAN:  1.  Parkinsonism  -I do not think that the patient has idiopathic Parkinsons Disease but rather one of the atypical parkinsonian states, and possibly Progressive Supranuclear Palsy (PSP).  We talked about nature, etiology and pathophysiology. We talked about how the symptoms, course and prognosis differ from Parkinson's Disease.  We talked about the risks, particularly for falls and aspiration.   -Looks better on levodopa and I am going to continue carbidopa/levodopa 25/100, 2 tablets 3 times per day.    -seeing urology today and ask about potentially changing oxybutinin given number of falls  -needs to use walker at all times!!!  Sent rehab message asking for walker evaluation.  Talked to them extensively about safety and about future, including fact that she will likely be WC bound in future.  Talked about merry walker as well. 2.  Neck pain  -I think this is multifactorial.  She did have an MRI of the cervical spine on 06/03/2016 that demonstrated severe C2-C3 left facet arthropathy with joint effusion and bone marrow edema.  She has received injections for this.  She also has evidence of cervical dystonia, although this surprisingly has improved some with levodopa.   3.  I plan on seeing the  patient back in the next 3-4 months, sooner should new neurologic issues arise.  Much greater than 50% of this 40 minute visit spent in counseling with the patient and her husband.  They asked multiple questions and I answered them to the best of my ability.

## 2016-11-23 ENCOUNTER — Ambulatory Visit (INDEPENDENT_AMBULATORY_CARE_PROVIDER_SITE_OTHER): Payer: PPO | Admitting: Neurology

## 2016-11-23 ENCOUNTER — Encounter: Payer: Self-pay | Admitting: Neurology

## 2016-11-23 VITALS — BP 120/70 | HR 59 | Ht 60.0 in | Wt 135.0 lb

## 2016-11-23 DIAGNOSIS — G243 Spasmodic torticollis: Secondary | ICD-10-CM

## 2016-11-23 DIAGNOSIS — N202 Calculus of kidney with calculus of ureter: Secondary | ICD-10-CM | POA: Diagnosis not present

## 2016-11-23 DIAGNOSIS — G231 Progressive supranuclear ophthalmoplegia [Steele-Richardson-Olszewski]: Secondary | ICD-10-CM | POA: Diagnosis not present

## 2016-11-23 DIAGNOSIS — R296 Repeated falls: Secondary | ICD-10-CM | POA: Diagnosis not present

## 2016-11-23 NOTE — Addendum Note (Signed)
Addended byAnnamaria Helling on: 11/23/2016 01:44 PM   Modules accepted: Orders

## 2016-11-23 NOTE — Patient Instructions (Addendum)
1.  Use your walker at all times.  I sent rehab a note asking about a new walker evaluation 2.  Ask your urologist whether or not a substitution for oxybutinin would be appropriate given number of falls.  Perhaps myrbetriq would be a better option but would defer to expertise of your urologist 3.  Merry Christmas and I will see you in 3-4 months!

## 2016-12-02 ENCOUNTER — Ambulatory Visit: Payer: PPO | Attending: Neurology | Admitting: Physical Therapy

## 2016-12-02 ENCOUNTER — Ambulatory Visit: Payer: PPO | Admitting: Physical Therapy

## 2016-12-02 DIAGNOSIS — R2689 Other abnormalities of gait and mobility: Secondary | ICD-10-CM | POA: Insufficient documentation

## 2016-12-02 DIAGNOSIS — R29818 Other symptoms and signs involving the nervous system: Secondary | ICD-10-CM | POA: Diagnosis not present

## 2016-12-02 DIAGNOSIS — R2681 Unsteadiness on feet: Secondary | ICD-10-CM | POA: Insufficient documentation

## 2016-12-02 DIAGNOSIS — R293 Abnormal posture: Secondary | ICD-10-CM | POA: Diagnosis not present

## 2016-12-02 DIAGNOSIS — Z7901 Long term (current) use of anticoagulants: Secondary | ICD-10-CM | POA: Diagnosis not present

## 2016-12-02 DIAGNOSIS — I358 Other nonrheumatic aortic valve disorders: Secondary | ICD-10-CM | POA: Diagnosis not present

## 2016-12-02 NOTE — Patient Instructions (Addendum)
Medical supply stores: [facility] on [address] [phone]  [facility] on [address] or in [place] [phone] for both locations  [facility] on [address] [phone] 

## 2016-12-03 NOTE — Therapy (Signed)
[facility] [address] [place], [place], [address] Phone: [phone]   Fax:  [phone]  Physical Therapy Evaluation  Patient Details  Name: [name] MRN: [id number] Date of Birth: [birth year] Referring Provider: Alonza Bogus, DO  Encounter Date: 12/02/2016      PT End of Session - 12/03/16 1849    Visit Number 1   Number of Visits 12   Date for PT Re-Evaluation 02/02/17   Authorization Type [employer] every 10th visit   PT Start Time 0947   PT Stop Time 1420   PT Time Calculation (min) 63 min   Activity Tolerance Patient tolerated treatment well   Behavior During Therapy [facility] for tasks assessed/performed      Past Medical History:  Diagnosis Date  . Anticoagulated on Coumadin   . Arthropathy of cervical facet joint    C2-3  . At risk for falls   . BPV (benign positional vertigo)   . Bruises easily   . Cancer ([place]) 2004    breast-lumpectomy,nodes ,radiation,chemo  . Chronic neck pain   . CKD (chronic kidney disease), stage II   . Diverticulosis of colon   . Elevated troponin    a. 08/2016 in setting of PAF;  b. 08/2016 low risk MV w/ fixed septal defect (LBBB), EF 53%.  Marland Kitchen GERD (gastroesophageal reflux disease)   . Hiatal hernia   . History of aortic valve stenosis   . History of breast cancer per pt no recurrence   dx 2004-- Left breast DCIS (ER/PR+, HER2 negative) s/p  partial masectomy w/ sln bx and  chemoradiotion (completed 2004)  . History of colon polyps   . History of herpes zoster   . History of kidney stones   . History of kidney stones   . HTN (hypertension)   . Hyperlipidemia   . Hypertrophy of inter-atrial septum    a. 09/2014 Echo: ? of atrial mass; b. 10/2014 Cardiac MRI: no atrial septal mass - polypoid lipomatous hypertrophy of superior atrial septum noted-->Benign.  . Irritable bowel syndrome   . LBBB (left bundle branch block)   . PAF (paroxysmal atrial fibrillation)  (HCC)    a. CHA2DS2VASc = 4-->coumadin.  . Parkinsonism [facility])    neurologist-  dr tat  . PONV (postoperative nausea and vomiting)   . PVC's (premature ventricular contractions)   . Right ureteral stone   . S/P aortic valve replacement with prosthetic valve    a. 04/13/2006 s/p St Jude mechnical AVR for severe AS;  b. 09/2014 Echo: EF 40-45%, gr1 DD, mild AI/MR, triv TR/PR.  . SUI (stress urinary incontinence, female)   . Unstable balance    uses cane  . Venous varices      Past Surgical History:  Procedure Laterality Date  . ANTERIOR AND POSTERIOR REPAIR  1990   cystocele/ rectocele  . AORTIC VALVE REPLACEMENT  04/13/06   dr gerhardt   and Replacement Ascending Aorta (St. Jude mechanical prosthesis)  . APPENDECTOMY  child 1950  . BREAST SURGERY Left 2004  . CARDIAC CATHETERIZATION  02-22-2001   dr Martinique   minimal non-obstructive cad/  mild aortic stenosis and aortic root enlargement/  normal LVF, ef 65%  . CARDIAC CATHETERIZATION  03-22-2006    dr Martinique   no significant obstructive cad/  severe aortic stenosis and mild to moderate aortic root enlargement/  upper normal right heart pressures  . CARDIOVASCULAR STRESS TEST  09-30-2011   dr Martinique   lexiscan nuclear  study w/ apical thinning  vs  small prior apical infarct w/ no significant ischemia/  hypokinesis of the distal septum and apex, ef 50%  . CARPAL TUNNEL RELEASE Bilateral left 09-09-2004/  right 2007  . CATARACT EXTRACTION W/ INTRAOCULAR LENS  IMPLANT, BILATERAL  2015  . COLONOSCOPY N/A 10/03/2014   Procedure: COLONOSCOPY;  Surgeon: [name], MD;  Location: [facility];  Service: Endoscopy;  Laterality: N/A;  . CYSTOSCOPY WITH RETROGRADE PYELOGRAM, URETEROSCOPY AND STENT PLACEMENT Right 09/17/2016   Procedure: CYSTOSCOPY WITH RIGHT RETROGRADE PYELOGRAM, RIGHT URETEROSCOPY AND STENT PLACEMENT LASER LITHOTRIPSY, RIGHT STENT PLACEMENT;  Surgeon: [name], MD;  Location[facility];  Service:  Urology;  Laterality: Right;  . ESOPHAGEAL MANOMETRY N/A 08/12/2014   Procedure: ESOPHAGEAL MANOMETRY (EM);  Surgeon: [name], MD;  Location: [facility];  Service: Endoscopy;  Laterality: N/A;  . ESOPHAGOGASTRODUODENOSCOPY N/A 10/03/2014   Procedure: ESOPHAGOGASTRODUODENOSCOPY (EGD);  Surgeon: [name], MD;  Location: [facility] END[facility]COPY;  Service: Endoscopy;  Laterality: N/A;  . [place]  . FOOT SURGERY Right 2013   hammertoe x2  . HOLMIUM LASER APPLICATION Right 0/17/7939   Procedure: HOLMIUM LASER APPLICATION;  Surgeon: [name], MD;  Location: [facility];  Service: Urology;  Laterality: Right;  . PERCUTANEOUS NEPHROSTOLITHOTOMY  1993  . STONE EXTRACTION WITH BASKET Right 09/17/2016   Procedure: STONE EXTRACTION WITH BASKET;  Surgeon: [name], MD;  Location: [facility];  Service: Urology;  Laterality: Right;  . TOTAL ABDOMINAL HYSTERECTOMY  1971   w/ Bilateral Salpingoophorectomy  . TRANSTHORACIC ECHOCARDIOGRAM  10/24/2014   dr Martinique   hypokinesis of the basal and mid inferoseptal and inferior walls,  ef 40-45%,  grade 1 diastolic dysfunction/  mechanical bileaflet aortic valve noted w/ mild regur., peak grandient 41mHg, valve area 1.35cm^2/  severe MV calcification without stenosis w/ mild regurg., peak gradient 365mg/  mild LAE/  poss. atrial mass (MRI showed benign lipomatous hypertrophy of atrial septum) /tFrazier ButtR/mild TR    There were no vitals filed for this visit.       Subjective Assessment - 12/02/16 1319    Subjective Wanted some advice on what type of walker would be best for her.  We currently have one that we are borrowing.  Pt reports she has had 6 falls in the past 2 weeks.  (has had more falls-multiple)  Reports some falls have occurred with walking-feet just get tangled.  One fall was foot catching on threshold, while working on the Christmas tree; husband notes difficulty  with turning.   Patient is accompained by: Family member  Husband   Patient Stated Goals Pt's goal for therapy is to find the right type of walker to help prevent falls.   Currently in Pain? Yes   Pain Score 3    Pain Location Back   Pain Orientation Lower   Pain Descriptors / Indicators Aching   Pain Type Chronic pain   Pain Onset More than a month ago   Pain Frequency Intermittent   Aggravating Factors  standing and walking too long aggravates   Pain Relieving Factors sitting, use of heating pad            [facility] PT Assessment - 12/02/16 1327      Assessment   Medical Diagnosis Parkinsonism   Referring Provider ReWells Guilesat, DO   Onset Date/Surgical Date 11/23/16  MD visit     Precautions  Precautions Fall   Precaution Comments mulitple falls in the past  6 months     Balance Screen   Has the patient fallen in the past 6 months Yes   How many times? --  multiple-at least 6 in the past 2 weeks   Has the patient had a decrease in activity level because of a fear of falling?  No   Is the patient reluctant to leave their home because of a fear of falling?  No     Home Environment   Living Environment Private residence   Living Arrangements Spouse/significant other   Available Help at Discharge Family  Husband works part-time and patient home alone short times   Type of [place] to enter   Entrance Stairs-Number of Steps 1   [place] Two level;Able to live on main level with bedroom/bathroom   [place] - single point  Borrowed rolling walker     Prior Function   Level of Independence Independent with basic ADLs;Needs assistance with gait   Vocation Retired   Leisure Enjoys riding the exercise bike daily (2 miles); enjoys reading     Posture/Postural Control   Posture/Postural Control Postural limitations   Postural Limitations Forward head;Rounded Shoulders     ROM / Strength   AROM / PROM / Strength Strength     Strength    Overall Strength Comments grossly tested at least 4/5 bilateral hip flexors, quads, hamstrings; ankle dorsflexion L 3+/5     Transfers   Transfers Sit to Stand;Stand to Sit   Sit to Stand 5: Supervision;4: Min guard   Five time sit to stand comments  11.39   Stand to Sit 5: Supervision   Comments Posterior pull upon trying to stand 2 of 5 trials; reports difficulty with low surface transfers     Ambulation/Gait   Ambulation/Gait Yes   Ambulation/Gait Assistance 5: Supervision;4: Min guard   Ambulation Distance (Feet) 50 Feet  multiple reps, multiple devices   Assistive device Straight cane  Trial of rollator, rolling walker, U-step RW   Gait Pattern Step-through pattern;Decreased step length - right;Decreased step length - left;Shuffle;Scissoring;Festinating;Trunk flexed;Narrow base of support;Poor foot clearance - left;Poor foot clearance - right  festination/freezing noted with turns   Ambulation Surface Level;Indoor   Gait velocity 15.24 sec (2.15 ft/sec) with cane; 21.28 sec RW (1.54 ft/sec)  28.94 (1.13 ft/sec)with U-step RW; 19.93 (1.65 ft/s rollator   Gait Comments Husband reports noting difficulty with turning     Standardized Balance Assessment   Standardized Balance Assessment Timed Up and Go Test     Timed Up and Go Test   Normal TUG (seconds) 20.39  with cane   TUG Comments TUG 24.25 sec with RW (festinating with turns); 23.40 sec with rollator walker; 21.42 sec with U-step RW (with R foot festinating with gait)       Gait with rolling walker:  Pt has increased festination/freezing with gait during turns,with decreased foot clearance and narrowed BOS.  Gait with rollator-pt able to improve fluid gait during turns.  U-STEP Rolling walker:  Pt has overall slowed gait pattern, but noted to have decreased freezing with turns.  Discussed that rollator versus U-step rolling walker would be likely choice for safest assistive device at this time.  Husband has questions  regarding correct height and width for patient's size, with PT attempting to answer questions.  PT Education - 12/03/16 1836    Education provided Yes   Education Details Discussed various assistive devices -pros/cons of RW, rollator, U-step RW; medical supply stores in area    Person(s) Educated Patient;Spouse   Methods Explanation   Comprehension Verbalized understanding          PT Short Term Goals - 12/03/16 1905      PT SHORT TERM GOAL #1   Title Patient will be able to perform HEP with assistance from husband to address balance, gait, transfers.  TARGET 01/02/17   Time 4   Period Weeks   Status New     PT SHORT TERM GOAL #2   Title Pt will perform at least 8 of 10 reps of sit<>stand transfers independently no posterior loss of balance.     Time 4   Period Weeks   Status New     PT SHORT TERM GOAL #3   Title Pt will ambulate at least 150 ft with appropriate assistive device with supervision, for improved safety with household gait.   Time 4   Period Weeks   Status New     PT SHORT TERM GOAL #5   Title Pt/husband will verbalize/demonstrate understanding of tips to reduce freezing with gait and turns.   Time 4   Period Weeks   Status New           PT Long Term Goals - 12/03/16 1910      PT LONG TERM GOAL #1   Title Pt/husband will verbalize understanding of fall prevention in home environment.  6 wk goal-TARGET 02/02/17   Time 6   Period Weeks   Status New     PT LONG TERM GOAL #2   Title Berg Balance score to be assessed, with patient improving by at least 5 points for decreased fall risk.   Time 6   Period Weeks   Status New     PT LONG TERM GOAL #3   Title Pt will improve TUG score to less than or equal to 15 seconds to decrease fall risk.   Time 6   Period Weeks   Status New     PT LONG TERM GOAL #4   Title Pt will improve gait velocity to at least 1.8 ft/sec for improved gait efficiency and safety, with  appropriate assistive device.   Time 6   Status New               Plan - 12/03/16 1852    Clinical Impression Statement Pt is a 73 female who presents to OP PT wtih Parkinsonism, with recent history of increased falls.  Pt reports at least 6 falls in the past 2 weeks.  She presents with decreased timing and coordinatino of gait, festination with gait and turns, decreased balance, bradykinesia, abnromal posture, decreased safety and efficiency with transfers.  Pt is at fall risk per TUG and gait velocity scores.  Pt would beneift from skilled PT to address the above stated deficits in order to improve functional mobility and decrease fall risk, as well as continued gait training with appropriate assistive device .  During initial trials of various assistive devices, pt appears to be safest with rollator or U-step RW, but further assessement and training is needed to make final decision on best assistive device.    Rehab Potential Fair   PT Frequency 2x / week   PT Duration 6 weeks   PT Treatment/Interventions ADLs/Self Care Home Management;Functional mobility training;Stair training;Gait training;DME  Instruction;Therapeutic activities;Therapeutic exercise;Balance training;Neuromuscular re-education;Patient/family education   PT Next Visit Plan Trial assistive devices-rollator versus U-step RW and recommend appropriate device for home; instruction in tips to reduce freezing with turns/gait; safety with gait/fall prevention   Consulted and Agree with Plan of Care Patient;Family member/caregiver   Family Member Consulted Husband     Plan for next visit-simulate turns, narrow spaces and household areas using rollator or U-step RW   Patient will benefit from skilled therapeutic intervention in order to improve the following deficits and impairments:  Abnormal gait, Decreased balance, Decreased mobility, Decreased safety awareness, Decreased strength, Difficulty walking, Postural  dysfunction  Visit Diagnosis: Other abnormalities of gait and mobility  Other symptoms and signs involving the nervous system  Unsteadiness on feet  Abnormal posture      G-Codes - 01-Jan-2017 1917    Functional Assessment Tool Used TUG 20.39 sec; 23.4 sec with rollator; 21.42 with U-step RW; gait velocity 1.65 ft/sec rollator, 1.13 ft/sec U-step RW; 6 falls in the past 2 weeks   Functional Limitation Mobility: Walking and moving around   Mobility: Walking and Moving Around Current Status [phone]) At least 40 percent but less than 60 percent impaired, limited or restricted   Mobility: Walking and Moving Around Goal Status [phone]) At least 20 percent but less than 40 percent impaired, limited or restricted       Problem List Patient Active Problem List   Diagnosis Date Noted  . Elevated troponin 09/19/2016  . Emesis 09/19/2016  . UTI (urinary tract infection) 09/19/2016  . Sinus bradycardia 09/16/2016  . Family history of colon cancer 07/30/2014  . Hiatal hernia 07/30/2014  . Chest pain 10/13/2011  . Disequilibrium 10/13/2011  . Heart valve replaced by other means 04/16/2011  . Chronic anticoagulation 03/17/2011  . S/P AVR (aortic valve replacement)   . PVC's (premature ventricular contractions)   . HTN (hypertension)   . Hypercholesteremia   . Atrial fibrillation with RVR ([place])   . GERD (gastroesophageal reflux disease)   . GERD 02/06/2010  . DIVERTICULOSIS-COLON 02/06/2010  . PERSONAL HX COLONIC POLYPS 02/06/2010  . MASTECTOMY, HX OF 02/06/2010    Kianah Harries W. 12/03/2016, 7:20 PM  Frazier Butt., PT  [facility] [address] [place], [place], [address] Phone: [phone]   Fax:  [phone]  Name: [name] MRN: [id number] Date of Birth: 1941-06-29

## 2016-12-05 DIAGNOSIS — L03116 Cellulitis of left lower limb: Secondary | ICD-10-CM | POA: Diagnosis not present

## 2016-12-05 DIAGNOSIS — D6489 Other specified anemias: Secondary | ICD-10-CM | POA: Diagnosis not present

## 2016-12-05 DIAGNOSIS — N186 End stage renal disease: Secondary | ICD-10-CM | POA: Diagnosis not present

## 2016-12-05 DIAGNOSIS — R600 Localized edema: Secondary | ICD-10-CM | POA: Diagnosis not present

## 2016-12-05 DIAGNOSIS — E1129 Type 2 diabetes mellitus with other diabetic kidney complication: Secondary | ICD-10-CM | POA: Diagnosis not present

## 2016-12-05 DIAGNOSIS — R2689 Other abnormalities of gait and mobility: Secondary | ICD-10-CM | POA: Diagnosis not present

## 2016-12-07 ENCOUNTER — Other Ambulatory Visit: Payer: Self-pay | Admitting: Neurology

## 2016-12-07 ENCOUNTER — Ambulatory Visit: Payer: PPO | Admitting: Physical Therapy

## 2016-12-07 DIAGNOSIS — R293 Abnormal posture: Secondary | ICD-10-CM

## 2016-12-07 DIAGNOSIS — R2681 Unsteadiness on feet: Secondary | ICD-10-CM

## 2016-12-07 DIAGNOSIS — R29818 Other symptoms and signs involving the nervous system: Secondary | ICD-10-CM

## 2016-12-07 DIAGNOSIS — R2689 Other abnormalities of gait and mobility: Secondary | ICD-10-CM

## 2016-12-07 NOTE — Therapy (Signed)
[facility] [address] [place], [place], [address] Phone: [phone]   Fax:  [phone]  Physical Therapy Treatment  Patient Details  Name: [name] MRN: [id number] Date of Birth: Dec 04, 1941 Referring Provider: Alonza Bogus, DO  Encounter Date: 12/07/2016      PT End of Session - 12/07/16 1312    Visit Number 2   Number of Visits 12   Date for PT Re-Evaluation 02/02/17   Authorization Type [employer] every 10th visit   PT Start Time 0934   PT Stop Time 1015   PT Time Calculation (min) 41 min   Activity Tolerance Patient tolerated treatment well   Behavior During Therapy [facility] for tasks assessed/performed      Past Medical History:  Diagnosis Date  . Anticoagulated on Coumadin   . Arthropathy of cervical facet joint    C2-3  . At risk for falls   . BPV (benign positional vertigo)   . Bruises easily   . Cancer ([place]) 2004    breast-lumpectomy,nodes ,radiation,chemo  . Chronic neck pain   . CKD (chronic kidney disease), stage II   . Diverticulosis of colon   . Elevated troponin    a. 08/2016 in setting of PAF;  b. 08/2016 low risk MV w/ fixed septal defect (LBBB), EF 53%.  Marland Kitchen GERD (gastroesophageal reflux disease)   . Hiatal hernia   . History of aortic valve stenosis   . History of breast cancer per pt no recurrence   dx 2004-- Left breast DCIS (ER/PR+, HER2 negative) s/p  partial masectomy w/ sln bx and  chemoradiotion (completed 2004)  . History of colon polyps   . History of herpes zoster   . History of kidney stones   . History of kidney stones   . HTN (hypertension)   . Hyperlipidemia   . Hypertrophy of inter-atrial septum    a. 09/2014 Echo: ? of atrial mass; b. 10/2014 Cardiac MRI: no atrial septal mass - polypoid lipomatous hypertrophy of superior atrial septum noted-->Benign.  . Irritable bowel syndrome   . LBBB (left bundle branch block)   . PAF (paroxysmal atrial fibrillation)  (HCC)    a. CHA2DS2VASc = 4-->coumadin.  . Parkinsonism [facility])    neurologist-  dr tat  . PONV (postoperative nausea and vomiting)   . PVC's (premature ventricular contractions)   . Right ureteral stone   . S/P aortic valve replacement with prosthetic valve    a. 04/13/2006 s/p St Jude mechnical AVR for severe AS;  b. 09/2014 Echo: EF 40-45%, gr1 DD, mild AI/MR, triv TR/PR.  . SUI (stress urinary incontinence, female)   . Unstable balance    uses cane  . Venous varices      Past Surgical History:  Procedure Laterality Date  . ANTERIOR AND POSTERIOR REPAIR  1990   cystocele/ rectocele  . AORTIC VALVE REPLACEMENT  04/13/06   dr gerhardt   and Replacement Ascending Aorta (St. Jude mechanical prosthesis)  . APPENDECTOMY  child 1950  . BREAST SURGERY Left 2004  . CARDIAC CATHETERIZATION  02-22-2001   dr Martinique   minimal non-obstructive cad/  mild aortic stenosis and aortic root enlargement/  normal LVF, ef 65%  . CARDIAC CATHETERIZATION  03-22-2006    dr Martinique   no significant obstructive cad/  severe aortic stenosis and mild to moderate aortic root enlargement/  upper normal right heart pressures  . CARDIOVASCULAR STRESS TEST  09-30-2011   dr Martinique   lexiscan nuclear  study w/ apical thinning  vs  small prior apical infarct w/ no significant ischemia/  hypokinesis of the distal septum and apex, ef 50%  . CARPAL TUNNEL RELEASE Bilateral left 09-09-2004/  right 2007  . CATARACT EXTRACTION W/ INTRAOCULAR LENS  IMPLANT, BILATERAL  2015  . COLONOSCOPY N/A 10/03/2014   Procedure: COLONOSCOPY;  Surgeon: [name], MD;  Location: [facility];  Service: Endoscopy;  Laterality: N/A;  . CYSTOSCOPY WITH RETROGRADE PYELOGRAM, URETEROSCOPY AND STENT PLACEMENT Right 09/17/2016   Procedure: CYSTOSCOPY WITH RIGHT RETROGRADE PYELOGRAM, RIGHT URETEROSCOPY AND STENT PLACEMENT LASER LITHOTRIPSY, RIGHT STENT PLACEMENT;  Surgeon: [name], MD;  Location: [facility];  Service:  Urology;  Laterality: Right;  . ESOPHAGEAL MANOMETRY N/A 08/12/2014   Procedure: ESOPHAGEAL MANOMETRY (EM);  Surgeon: [name], MD;  Location: [facility];  Service: Endoscopy;  Laterality: N/A;  . ESOPHAGOGASTRODUODENOSCOPY N/A 10/03/2014   Procedure: ESOPHAGOGASTRODUODENOSCOPY (EGD);  Surgeon: [name], MD;  Location: [facility] ENDOSCOPY;  Service: Endoscopy;  Laterality: N/A;  . [place]  . FOOT SURGERY Right 2013   hammertoe x2  . HOLMIUM LASER APPLICATION Right 2/54/2706   Procedure: HOLMIUM LASER APPLICATION;  Surgeon: [name], MD;  [facility];  Service: Urology;  Laterality: Right;  . PERCUTANEOUS NEPHROSTOLITHOTOMY  1993  . STONE EXTRACTION WITH BASKET Right 09/17/2016   Procedure: STONE EXTRACTION WITH BASKET;  Surgeon: [name], MD;  Location: [facility];  Service: Urology;  Laterality: Right;  . TOTAL ABDOMINAL HYSTERECTOMY  1971   w/ Bilateral Salpingoophorectomy  . TRANSTHORACIC ECHOCARDIOGRAM  10/24/2014   dr Martinique   hypokinesis of the basal and mid inferoseptal and inferior walls,  ef 40-45%,  grade 1 diastolic dysfunction/  mechanical bileaflet aortic valve noted w/ mild regur., peak grandient 37mHg, valve area 1.35cm^2/  severe MV calcification without stenosis w/ mild regurg., peak gradient 338mg/  mild LAE/  poss. atrial mass (MRI showed benign lipomatous hypertrophy of atrial septum) /tFrazier ButtR/mild TR    There were no vitals filed for this visit.      Subjective Assessment - 12/07/16 0938    Subjective Denies falls or changes.  Husband reports going to look at [employer] wants more information and measurements today.   Patient is accompained by: Family member  husband   Patient Stated Goals Pt's goal for therapy is to find the right type of walker to help prevent falls.   Currently in Pain? No/denies                         [facility] Adult PT  Treatment/Exercise - 12/07/16 0001      Transfers   Transfers Sit to Stand;Stand to Sit   Sit to Stand 5: Supervision;4: Min guard   Stand to Sit 5: Supervision   Number of Reps 10 reps   Comments Posterior lean against mat.  Repeated for transfer training.     Ambulation/Gait   Ambulation/Gait Yes   Ambulation/Gait Assistance 5: Supervision;4: Min guard   Ambulation Distance (Feet) 220 Feet   x 2 and 120' x 1   Assistive device Straight cane;Rollator;Other (Comment)  U-step   Gait Pattern Step-through pattern;Decreased step length - right;Decreased step length - left;Shuffle;Scissoring;Festinating;Trunk flexed;Narrow base of support;Poor foot clearance - left;Poor foot clearance - right   Ambulation Surface Level;Indoor   Gait Comments Worked on gait with both Rollator and U-step.  Instructed pt  and husband on proper height for seat and handles for Rollator as well as recommendation for a Petite Rollator due to pts height.  Discussed needing 18" inside width to allow pt to turn safely and decreased freezing.  Provided husband with actual measurements.  Discussed turns and how to reduce freezing with turns.  Practiced quarter turns with Rollator and without.  Pt and husband feel U-step may be too heavy to lift in/out of car.  Pt without significant difference in Rollator and Ustep functionally/safety wise so agree with Rollator for home use.     Posture/Postural Control   Posture/Postural Control Postural limitations   Postural Limitations Forward head;Rounded Shoulders                PT Education - 12/07/16 1309    Education provided Yes   Education Details Discussed proper fit of assistive devices and recommendation for rollator.  Tips to reduce freezing.   Person(s) Educated Patient;Spouse   Methods Explanation;Handout;Demonstration;Verbal cues   Comprehension Verbalized understanding;Need further instruction  needs continued reinforcement for freezing          PT  Short Term Goals - 12/03/16 1905      PT SHORT TERM GOAL #1   Title Patient will be able to perform HEP with assistance from husband to address balance, gait, transfers.  TARGET 01/02/17   Time 4   Period Weeks   Status New     PT SHORT TERM GOAL #2   Title Pt will perform at least 8 of 10 reps of sit<>stand transfers independently no posterior loss of balance.     Time 4   Period Weeks   Status New     PT SHORT TERM GOAL #3   Title Pt will ambulate at least 150 ft with appropriate assistive device with supervision, for improved safety with household gait.   Time 4   Period Weeks   Status New     PT SHORT TERM GOAL #5   Title Pt/husband will verbalize/demonstrate understanding of tips to reduce freezing with gait and turns.   Time 4   Period Weeks   Status New           PT Long Term Goals - 12/03/16 1910      PT LONG TERM GOAL #1   Title Pt/husband will verbalize understanding of fall prevention in home environment.  6 wk goal-TARGET 02/02/17   Time 6   Period Weeks   Status New     PT LONG TERM GOAL #2   Title Berg Balance score to be assessed, with patient improving by at least 5 points for decreased fall risk.   Time 6   Period Weeks   Status New     PT LONG TERM GOAL #3   Title Pt will improve TUG score to less than or equal to 15 seconds to decrease fall risk.   Time 6   Period Weeks   Status New     PT LONG TERM GOAL #4   Title Pt will improve gait velocity to at least 1.8 ft/sec for improved gait efficiency and safety, with appropriate assistive device.   Time 6   Status New               Plan - 12/07/16 1312    Clinical Impression Statement Pt needs cues during session with turns and safety with Rollator.  Husband to hopefully purchase Rollator before next session.  Continue PT per POC.   Rehab Potential  Fair   PT Frequency 2x / week   PT Duration 6 weeks   PT Treatment/Interventions ADLs/Self Care Home Management;Functional mobility  training;Stair training;Gait training;DME Instruction;Therapeutic activities;Therapeutic exercise;Balance training;Neuromuscular re-education;Patient/family education   PT Next Visit Plan Assess Rollator if pt has her new one.  instruction in tips to reduce freezing with turns/gait; safety with gait/fall prevention   Consulted and Agree with Plan of Care Patient;Family member/caregiver   Family Member Consulted Husband      Patient will benefit from skilled therapeutic intervention in order to improve the following deficits and impairments:  Abnormal gait, Decreased balance, Decreased mobility, Decreased safety awareness, Decreased strength, Difficulty walking, Postural dysfunction  Visit Diagnosis: Other abnormalities of gait and mobility  Other symptoms and signs involving the nervous system  Unsteadiness on feet  Abnormal posture     Problem List Patient Active Problem List   Diagnosis Date Noted  . Elevated troponin 09/19/2016  . Emesis 09/19/2016  . UTI (urinary tract infection) 09/19/2016  . Sinus bradycardia 09/16/2016  . Family history of colon cancer 07/30/2014  . Hiatal hernia 07/30/2014  . Chest pain 10/13/2011  . Disequilibrium 10/13/2011  . Heart valve replaced by other means 04/16/2011  . Chronic anticoagulation 03/17/2011  . S/P AVR (aortic valve replacement)   . PVC's (premature ventricular contractions)   . HTN (hypertension)   . Hypercholesteremia   . Atrial fibrillation with RVR ([place])   . GERD (gastroesophageal reflux disease)   . GERD 02/06/2010  . DIVERTICULOSIS-COLON 02/06/2010  . PERSONAL HX COLONIC POLYPS 02/06/2010  . MASTECTOMY, HX OF 02/06/2010    Narda Bonds 12/07/2016, 1:15 PM  [facility] [address] [place], [place], [address] Phone: [phone]   Fax:  [phone]  Name: [name] MRN: [id number] Date of Birth: 1941-05-03  Narda Bonds,  [place] 12/07/16 1:15 PM Phone: [phone] Fax: [phone]

## 2016-12-07 NOTE — Patient Instructions (Addendum)

## 2016-12-09 ENCOUNTER — Ambulatory Visit: Payer: PPO | Admitting: Physical Therapy

## 2016-12-12 DIAGNOSIS — R600 Localized edema: Secondary | ICD-10-CM | POA: Diagnosis not present

## 2016-12-12 DIAGNOSIS — D6489 Other specified anemias: Secondary | ICD-10-CM | POA: Diagnosis not present

## 2016-12-12 DIAGNOSIS — L03116 Cellulitis of left lower limb: Secondary | ICD-10-CM | POA: Diagnosis not present

## 2016-12-23 ENCOUNTER — Ambulatory Visit: Payer: PPO | Admitting: Physical Therapy

## 2016-12-23 DIAGNOSIS — R2689 Other abnormalities of gait and mobility: Secondary | ICD-10-CM | POA: Diagnosis not present

## 2016-12-23 DIAGNOSIS — R29818 Other symptoms and signs involving the nervous system: Secondary | ICD-10-CM

## 2016-12-23 DIAGNOSIS — R2681 Unsteadiness on feet: Secondary | ICD-10-CM

## 2016-12-23 NOTE — Therapy (Signed)
[facility] [address] [place], [place], [address] Phone: [phone]   Fax:  [phone]  Physical Therapy Treatment  Patient Details  Name: [name] MRN: [id number] Date of Birth: [birth year] Referring Provider: Alonza Bogus, DO  Encounter Date: 12/23/2016      PT End of Session - 12/23/16 0900    Visit Number 3   Number of Visits 12   Date for PT Re-Evaluation 02/02/17   Authorization Type [employer] every 10th visit   PT Start Time 0802   PT Stop Time 0845   PT Time Calculation (min) 43 min   Activity Tolerance Patient tolerated treatment well   Behavior During Therapy [facility] for tasks assessed/performed      Past Medical History:  Diagnosis Date  . Anticoagulated on Coumadin   . Arthropathy of cervical facet joint    C2-3  . At risk for falls   . BPV (benign positional vertigo)   . Bruises easily   . Cancer ([place]) 2004    breast-lumpectomy,nodes ,radiation,chemo  . Chronic neck pain   . CKD (chronic kidney disease), stage II   . Diverticulosis of colon   . Elevated troponin    a. 08/2016 in setting of PAF;  b. 08/2016 low risk MV w/ fixed septal defect (LBBB), EF 53%.  Marland Kitchen GERD (gastroesophageal reflux disease)   . Hiatal hernia   . History of aortic valve stenosis   . History of breast cancer per pt no recurrence   dx 2004-- Left breast DCIS (ER/PR+, HER2 negative) s/p  partial masectomy w/ sln bx and  chemoradiotion (completed 2004)  . History of colon polyps   . History of herpes zoster   . History of kidney stones   . History of kidney stones   . HTN (hypertension)   . Hyperlipidemia   . Hypertrophy of inter-atrial septum    a. 09/2014 Echo: ? of atrial mass; b. 10/2014 Cardiac MRI: no atrial septal mass - polypoid lipomatous hypertrophy of superior atrial septum noted-->Benign.  . Irritable bowel syndrome   . LBBB (left bundle branch block)   . PAF (paroxysmal atrial fibrillation)  (HCC)    a. CHA2DS2VASc = 4-->coumadin.  . Parkinsonism [facility])    neurologist-  dr tat  . PONV (postoperative nausea and vomiting)   . PVC's (premature ventricular contractions)   . Right ureteral stone   . S/P aortic valve replacement with prosthetic valve    a. 04/13/2006 s/p St Jude mechnical AVR for severe AS;  b. 09/2014 Echo: EF 40-45%, gr1 DD, mild AI/MR, triv TR/PR.  . SUI (stress urinary incontinence, female)   . Unstable balance    uses cane  . Venous varices      Past Surgical History:  Procedure Laterality Date  . ANTERIOR AND POSTERIOR REPAIR  1990   cystocele/ rectocele  . AORTIC VALVE REPLACEMENT  04/13/06   dr gerhardt   and Replacement Ascending Aorta (St. Jude mechanical prosthesis)  . APPENDECTOMY  child 1950  . BREAST SURGERY Left 2004  . CARDIAC CATHETERIZATION  02-22-2001   dr Martinique   minimal non-obstructive cad/  mild aortic stenosis and aortic root enlargement/  normal LVF, ef 65%  . CARDIAC CATHETERIZATION  03-22-2006    dr Martinique   no significant obstructive cad/  severe aortic stenosis and mild to moderate aortic root enlargement/  upper normal right heart pressures  . CARDIOVASCULAR STRESS TEST  09-30-2011   dr Martinique   lexiscan nuclear  study w/ apical thinning  vs  small prior apical infarct w/ no significant ischemia/  hypokinesis of the distal septum and apex, ef 50%  . CARPAL TUNNEL RELEASE Bilateral left 09-09-2004/  right 2007  . CATARACT EXTRACTION W/ INTRAOCULAR LENS  IMPLANT, BILATERAL  2015  . COLONOSCOPY N/A 10/03/2014   Procedure: COLONOSCOPY;  Surgeon: [name], MD;  Location: [facility];  Service: Endoscopy;  Laterality: N/A;  . CYSTOSCOPY WITH RETROGRADE PYELOGRAM, URETEROSCOPY AND STENT PLACEMENT Right 09/17/2016   Procedure: CYSTOSCOPY WITH RIGHT RETROGRADE PYELOGRAM, RIGHT URETEROSCOPY AND STENT PLACEMENT LASER LITHOTRIPSY, RIGHT STENT PLACEMENT;  Surgeon: [name], MD;  Location: [facility];  Service:  Urology;  Laterality: Right;  . ESOPHAGEAL MANOMETRY N/A 08/12/2014   Procedure: ESOPHAGEAL MANOMETRY (EM);  Surgeon: [name], MD;  Location: [facility];  Service: Endoscopy;  Laterality: N/A;  . ESOPHAGOGASTRODUODENOSCOPY N/A 10/03/2014   Procedure: ESOPHAGOGASTRODUODENOSCOPY (EGD);  Surgeon: [name], MD;  Location: [facility] END[facility]COPY;  Service: Endoscopy;  Laterality: N/A;  . [place]  . FOOT SURGERY Right 2013   hammertoe x2  . HOLMIUM LASER APPLICATION Right 3/32/9518   Procedure: HOLMIUM LASER APPLICATION;  Surgeon: [name], MD;  Location: [facility];  Service: Urology;  Laterality: Right;  . PERCUTANEOUS NEPHROSTOLITHOTOMY  1993  . STONE EXTRACTION WITH BASKET Right 09/17/2016   Procedure: STONE EXTRACTION WITH BASKET;  Surgeon: [name], MD;  Location: [facility];  Service: Urology;  Laterality: Right;  . TOTAL ABDOMINAL HYSTERECTOMY  1971   w/ Bilateral Salpingoophorectomy  . TRANSTHORACIC ECHOCARDIOGRAM  10/24/2014   dr Martinique   hypokinesis of the basal and mid inferoseptal and inferior walls,  ef 40-45%,  grade 1 diastolic dysfunction/  mechanical bileaflet aortic valve noted w/ mild regur., peak grandient 59mHg, valve area 1.35cm^2/  severe MV calcification without stenosis w/ mild regurg., peak gradient 313mg/  mild LAE/  poss. atrial mass (MRI showed benign lipomatous hypertrophy of atrial septum) /tFrazier ButtR/mild TR    There were no vitals filed for this visit.      Subjective Assessment - 12/23/16 0805    Subjective Got rollator walker about a week ago.  No falls.  Using the rollator at all times.  Sometimes for longer distances husband will hold hand and patient uses cane.   Patient is accompained by: Family member  wife   Patient Stated Goals Pt's goal for therapy is to find the right type of walker to help prevent falls.   Currently in Pain? No/denies                          [facility] Adult PT Treatment/Exercise - 12/23/16 0001      Transfers   Transfers Sit to Stand;Stand to Sit   Sit to Stand 6: Modified independent (Device/Increase time);5: Supervision   Sit to Stand Details (indicate cue type and reason) Initial cues provided for forward lean and hand placement   Five time sit to stand comments  12.85   Stand to Sit 6: Modified independent (Device/Increase time);5: Supervision   Number of Reps 10 reps  Last rep pt has posterior lean   Comments Practiced multiple reps of sit<>stand and then turn to sit in chair at 90 degree angle from mat, with cues to use rollator during this short distance, small space transfer.  Multiple reps, with cues needed for hand placement and sequence of  transfer for optimal safety.     Ambulation/Gait   Ambulation/Gait Yes   Ambulation/Gait Assistance 5: Supervision   Ambulation Distance (Feet) 500 Feet   Assistive device Rollator   Gait Pattern Step-through pattern;Decreased step length - right;Decreased step length - left;Shuffle;Scissoring;Festinating;Trunk flexed;Narrow base of support;Poor foot clearance - left;Poor foot clearance - right   Ambulation Surface Level;Indoor   Gait Comments Gait training with pt's new rollator walker this visit, including turns around furniture and small radius turns to sit (see above).  Prior to gait, assessed walker for proper height-husband has walker and seat adjusted to proper height.     Standardized Balance Assessment   Standardized Balance Assessment Berg Balance Test  Scores <45/56 indicate increased fall risk     Berg Balance Test   Sit to Stand Able to stand without using hands and stabilize independently   Standing Unsupported Able to stand safely 2 minutes   Sitting with Back Unsupported but Feet Supported on Floor or Stool Able to sit safely and securely 2 minutes   Stand to Sit Sits safely with minimal use of hands   Transfers Able to  transfer safely, definite need of hands   Standing Unsupported with Eyes Closed Able to stand 10 seconds safely   Standing Ubsupported with Feet Together Able to place feet together independently and stand 1 minute safely   From Standing, Reach Forward with Outstretched Arm Can reach forward >12 cm safely (5")   From Standing Position, Pick up Object from Floor Able to pick up shoe, needs supervision   From Standing Position, Turn to Look Behind Over each Shoulder Looks behind from both sides and weight shifts well   Turn 360 Degrees Able to turn 360 degrees safely but slowly   Standing Unsupported, Alternately Place Feet on Step/Stool Able to complete 4 steps without aid or supervision   Standing Unsupported, One Foot in Front Able to take small step independently and hold 30 seconds   Standing on One Leg Tries to lift leg/unable to hold 3 seconds but remains standing independently   Total Score 44          Self Care: Discussed POC, as patient questions if further PT visits are needed.  Based on Berg score, based on pt's continued freezing with gait and turns, PT recommends continued therapy to further reinforce safety with gait and turns using rollator walker and to update HEP (from previous bout of therapy).  Pt in agreement.      PT Education - 12/23/16 0859    Education provided Yes   Education Details Reviewed and practiced tips to reduce freezing of gait and turns; discussion regarding POC   Person(s) Educated Patient;Spouse   Methods Explanation;Demonstration;Handout   Comprehension Verbalized understanding;Returned demonstration;Verbal cues required;Need further instruction          PT Short Term Goals - 12/23/16 0820      PT SHORT TERM GOAL #1   Title Patient will be able to perform HEP with assistance from husband to address balance, gait, transfers.  TARGET 01/02/17   Time 4   Period Weeks   Status On-going     PT SHORT TERM GOAL #2   Title Pt will perform at  least 8 of 10 reps of sit<>stand transfers independently no posterior loss of balance.     Time 4   Period Weeks   Status Achieved     PT SHORT TERM GOAL #3   Title Pt will ambulate at least 150 ft  with appropriate assistive device with supervision, for improved safety with household gait.   Time 4   Period Weeks   Status Achieved     PT SHORT TERM GOAL #4   Title NA     PT SHORT TERM GOAL #5   Title Pt/husband will verbalize/demonstrate understanding of tips to reduce freezing with gait and turns.   Time 4   Period Weeks   Status On-going           PT Long Term Goals - 12/03/16 1910      PT LONG TERM GOAL #1   Title Pt/husband will verbalize understanding of fall prevention in home environment.  6 wk goal-TARGET 02/02/17   Time 6   Period Weeks   Status New     PT LONG TERM GOAL #2   Title Berg Balance score to be assessed, with patient improving by at least 5 points for decreased fall risk.   Time 6   Period Weeks   Status New     PT LONG TERM GOAL #3   Title Pt will improve TUG score to less than or equal to 15 seconds to decrease fall risk.   Time 6   Period Weeks   Status New     PT LONG TERM GOAL #4   Title Pt will improve gait velocity to at least 1.8 ft/sec for improved gait efficiency and safety, with appropriate assistive device.   Time 6   Status New               Plan - 12/23/16 0901    Clinical Impression Statement Pt returns to PT today with newly purchased rollator walker, which is appropriate for patient's height.  Focused time today on gait practice and small radius turn practice, as well as transfer sequence using rollator.  Pt needs cueing throughout session to reinforce safe and proper technique for transfers and for turns using rollator.  Had discussion regarding POC, as patient questions whether additional PT visits are needed.  PT recommends continuing therapy per POC to reinforce transfer, gait, turning techniques and to update HEP for  balance.   Rehab Potential Fair   PT Frequency 2x / week   PT Duration 6 weeks   PT Treatment/Interventions ADLs/Self Care Home Management;Functional mobility training;Stair training;Gait training;DME Instruction;Therapeutic activities;Therapeutic exercise;Balance training;Neuromuscular re-education;Patient/family education   PT Next Visit Plan Gait, turns, small space gait negotiation using rollator; assess pt's current HEP (from previous bout of therapy) and update as appropriate to address balance   Consulted and Agree with Plan of Care Patient;Family member/caregiver   Family Member Consulted Husband      Patient will benefit from skilled therapeutic intervention in order to improve the following deficits and impairments:  Abnormal gait, Decreased balance, Decreased mobility, Decreased safety awareness, Decreased strength, Difficulty walking, Postural dysfunction  Visit Diagnosis: Other abnormalities of gait and mobility  Other symptoms and signs involving the nervous system  Unsteadiness on feet     Problem List Patient Active Problem List   Diagnosis Date Noted  . Elevated troponin 09/19/2016  . Emesis 09/19/2016  . UTI (urinary tract infection) 09/19/2016  . Sinus bradycardia 09/16/2016  . Family history of colon cancer 07/30/2014  . Hiatal hernia 07/30/2014  . Chest pain 10/13/2011  . Disequilibrium 10/13/2011  . Heart valve replaced by other means 04/16/2011  . Chronic anticoagulation 03/17/2011  . S/P AVR (aortic valve replacement)   . PVC's (premature ventricular contractions)   . HTN (hypertension)   .  Hypercholesteremia   . Atrial fibrillation with RVR ([place])   . GERD (gastroesophageal reflux disease)   . GERD 02/06/2010  . DIVERTICULOSIS-COLON 02/06/2010  . PERSONAL HX COLONIC POLYPS 02/06/2010  . MASTECTOMY, HX OF 02/06/2010    Jalaine Riggenbach W. 12/23/2016, 9:06 AM  Frazier Butt., PT [facility] [address] [place], [place], [address] Phone: [phone]   Fax:  [phone]  Name: LASHICA HANNAY MRN: [id number] Date of Birth: 1941/10/03

## 2016-12-23 NOTE — Patient Instructions (Signed)

## 2016-12-30 ENCOUNTER — Ambulatory Visit: Payer: PPO | Attending: Neurology | Admitting: Physical Therapy

## 2016-12-30 DIAGNOSIS — R471 Dysarthria and anarthria: Secondary | ICD-10-CM | POA: Insufficient documentation

## 2016-12-30 DIAGNOSIS — Z7901 Long term (current) use of anticoagulants: Secondary | ICD-10-CM | POA: Diagnosis not present

## 2016-12-30 DIAGNOSIS — R29818 Other symptoms and signs involving the nervous system: Secondary | ICD-10-CM | POA: Diagnosis not present

## 2016-12-30 DIAGNOSIS — R2681 Unsteadiness on feet: Secondary | ICD-10-CM | POA: Diagnosis not present

## 2016-12-30 DIAGNOSIS — R278 Other lack of coordination: Secondary | ICD-10-CM | POA: Diagnosis not present

## 2016-12-30 DIAGNOSIS — R29898 Other symptoms and signs involving the musculoskeletal system: Secondary | ICD-10-CM | POA: Insufficient documentation

## 2016-12-30 DIAGNOSIS — R2689 Other abnormalities of gait and mobility: Secondary | ICD-10-CM

## 2016-12-30 DIAGNOSIS — R1312 Dysphagia, oropharyngeal phase: Secondary | ICD-10-CM | POA: Insufficient documentation

## 2016-12-30 DIAGNOSIS — R41844 Frontal lobe and executive function deficit: Secondary | ICD-10-CM | POA: Insufficient documentation

## 2016-12-30 DIAGNOSIS — I358 Other nonrheumatic aortic valve disorders: Secondary | ICD-10-CM | POA: Diagnosis not present

## 2016-12-30 NOTE — Patient Instructions (Addendum)
   Continue Sit<>stand practice, x 10 reps  Also perform PWR! Step in posterior direction, UE support at counter, x 10 reps each side  Above PWR! Moves exercises performed 20 reps, with UE support.

## 2016-12-30 NOTE — Therapy (Signed)
[facility] [address] [place], [place], [address] Phone: [phone]   Fax:  [phone]  Physical Therapy Treatment  Patient Details  Name: [name] MRN: [id number] Date of Birth: [birth year] Referring Provider: Alonza Bogus, DO  Encounter Date: 12/30/2016      PT End of Session - 12/30/16 0946    Visit Number 4   Number of Visits 12   Date for PT Re-Evaluation 02/02/17   Authorization Type HealthTeam-GCODE every 10th visit   PT Start Time 0802   PT Stop Time 0842   PT Time Calculation (min) 40 min   Activity Tolerance Patient tolerated treatment well   Behavior During Therapy [facility] for tasks assessed/performed      Past Medical History:  Diagnosis Date  . Anticoagulated on Coumadin   . Arthropathy of cervical facet joint    C2-3  . At risk for falls   . BPV (benign positional vertigo)   . Bruises easily   . Cancer ([place]) 2004    breast-lumpectomy,nodes ,radiation,chemo  . Chronic neck pain   . CKD (chronic kidney disease), stage II   . Diverticulosis of colon   . Elevated troponin    a. 08/2016 in setting of PAF;  b. 08/2016 low risk MV w/ fixed septal defect (LBBB), EF 53%.  Marland Kitchen GERD (gastroesophageal reflux disease)   . Hiatal hernia   . History of aortic valve stenosis   . History of breast cancer per pt no recurrence   dx 2004-- Left breast DCIS (ER/PR+, HER2 negative) s/p  partial masectomy w/ sln bx and  chemoradiotion (completed 2004)  . History of colon polyps   . History of herpes zoster   . History of kidney stones   . History of kidney stones   . HTN (hypertension)   . Hyperlipidemia   . Hypertrophy of inter-atrial septum    a. 09/2014 Echo: ? of atrial mass; b. 10/2014 Cardiac MRI: no atrial septal mass - polypoid lipomatous hypertrophy of superior atrial septum noted-->Benign.  . Irritable bowel syndrome   . LBBB (left bundle branch block)   . PAF (paroxysmal atrial fibrillation)  (HCC)    a. CHA2DS2VASc = 4-->coumadin.  . Parkinsonism [facility])    neurologist-  dr tat  . PONV (postoperative nausea and vomiting)   . PVC's (premature ventricular contractions)   . Right ureteral stone   . S/P aortic valve replacement with prosthetic valve    a. 04/13/2006 s/p St Jude mechnical AVR for severe AS;  b. 09/2014 Echo: EF 40-45%, gr1 DD, mild AI/MR, triv TR/PR.  . SUI (stress urinary incontinence, female)   . Unstable balance    uses cane  . Venous varices      Past Surgical History:  Procedure Laterality Date  . ANTERIOR AND POSTERIOR REPAIR  1990   cystocele/ rectocele  . AORTIC VALVE REPLACEMENT  04/13/06   dr gerhardt   and Replacement Ascending Aorta (St. Jude mechanical prosthesis)  . APPENDECTOMY  child 1950  . BREAST SURGERY Left 2004  . CARDIAC CATHETERIZATION  02-22-2001   dr Martinique   minimal non-obstructive cad/  mild aortic stenosis and aortic root enlargement/  normal LVF, ef 65%  . CARDIAC CATHETERIZATION  03-22-2006    dr Martinique   no significant obstructive cad/  severe aortic stenosis and mild to moderate aortic root enlargement/  upper normal right heart pressures  . CARDIOVASCULAR STRESS TEST  09-30-2011   dr Martinique   lexiscan nuclear  study w/ apical thinning  vs  small prior apical infarct w/ no significant ischemia/  hypokinesis of the distal septum and apex, ef 50%  . CARPAL TUNNEL RELEASE Bilateral left 09-09-2004/  right 2007  . CATARACT EXTRACTION W/ INTRAOCULAR LENS  IMPLANT, BILATERAL  2015  . COLONOSCOPY N/A 10/03/2014   Procedure: COLONOSCOPY;  Surgeon: [name], MD;  Location: [facility];  Service: Endoscopy;  Laterality: N/A;  . CYSTOSCOPY WITH RETROGRADE PYELOGRAM, URETEROSCOPY AND STENT PLACEMENT Right 09/17/2016   Procedure: CYSTOSCOPY WITH RIGHT RETROGRADE PYELOGRAM, RIGHT URETEROSCOPY AND STENT PLACEMENT LASER LITHOTRIPSY, RIGHT STENT PLACEMENT;  Surgeon: [name], MD;  Location: [facility];  Service:  Urology;  Laterality: Right;  . ESOPHAGEAL MANOMETRY N/A 08/12/2014   Procedure: ESOPHAGEAL MANOMETRY (EM);  Surgeon: [name], MD;  Location: [facility];  Service: Endoscopy;  Laterality: N/A;  . ESOPHAGOGASTRODUODENOSCOPY N/A 10/03/2014   Procedure: ESOPHAGOGASTRODUODENOSCOPY (EGD);  Surgeon: [name], MD;  Location: [facility] ENDOSCOPY;  Service: Endoscopy;  Laterality: N/A;  . [place]  . FOOT SURGERY Right 2013   hammertoe x2  . HOLMIUM LASER APPLICATION Right 7/61/9509   Procedure: HOLMIUM LASER APPLICATION;  Surgeon: [name], MD;  Location: [facility];  Service: Urology;  Laterality: Right;  . PERCUTANEOUS NEPHROSTOLITHOTOMY  1993  . STONE EXTRACTION WITH BASKET Right 09/17/2016   Procedure: STONE EXTRACTION WITH BASKET;  Surgeon: [name], MD;  Location: [facility];  Service: Urology;  Laterality: Right;  . TOTAL ABDOMINAL HYSTERECTOMY  1971   w/ Bilateral Salpingoophorectomy  . TRANSTHORACIC ECHOCARDIOGRAM  10/24/2014   dr Martinique   hypokinesis of the basal and mid inferoseptal and inferior walls,  ef 40-45%,  grade 1 diastolic dysfunction/  mechanical bileaflet aortic valve noted w/ mild regur., peak grandient 35mHg, valve area 1.35cm^2/  severe MV calcification without stenosis w/ mild regurg., peak gradient 355mg/  mild LAE/  poss. atrial mass (MRI showed benign lipomatous hypertrophy of atrial septum) /tFrazier ButtR/mild TR    There were no vitals filed for this visit.      Subjective Assessment - 12/30/16 0804    Subjective No changes since last visit.  No falls.  Using the walker all the time, and it is working out nicely.   Patient Stated Goals Pt's goal for therapy is to find the right type of walker to help prevent falls.   Currently in Pain? No/denies                         [facility] Adult PT Treatment/Exercise - 12/30/16 0001      Transfers   Transfers Sit to  Stand;Stand to Sit   Sit to Stand 6: Modified independent (Device/Increase time);5: Supervision   Sit to Stand Details (indicate cue type and reason) Initial cues for forward lean   Stand to Sit 6: Modified independent (Device/Increase time);5: Supervision   Number of Reps 10 reps;Other sets (comment);2 sets  10 reps each from 20" and 18" surfaces     Ambulation/Gait   Ambulation/Gait Yes   Ambulation/Gait Assistance 5: Supervision   Ambulation Distance (Feet) 600 Feet   Assistive device Rollator   Gait Pattern Step-through pattern;Decreased step length - right;Decreased step length - left;Shuffle;Scissoring;Festinating;Trunk flexed;Narrow base of support;Poor foot clearance - left;Poor foot clearance - right   Ambulation Surface Level;Indoor   Gait Comments Gait training activities incorporating turns, small spaces, quick change of direction,  conversation tasks while walking.  Short distance turns, to transfer mat<>chair, using rollator, 4 reps, with cues for increased step height and foot clearance           [facility]) - 12/30/16 0817    PWR! exercises Moves in standing   PWR! Up x 20   PWR! Rock x 20  cues to hold to counter for support, 1-2 sec hold   PWR! Twist x 10 reps trial; PT requests pt hold on this exercise due to decreased balance   PWR Step x 10 reps side, x 10 reps back with UE support    Comments Second set of PWR! Moves x 10 reps performed with handouts for HEP-pt needs minimal cues for technique and for pacing.    Pt needs cues to slow pace, improve deliberate, large amplitude movement patterns with exercises.         PT Education - 12/30/16 0946    Education provided Yes   Education Details HEP-see instructions   Person(s) Educated Patient   Methods Explanation;Demonstration;Handout;Verbal cues   Comprehension Verbalized understanding;Verbal cues required;Returned demonstration          PT Short Term Goals - 12/23/16 0820      PT SHORT TERM GOAL #1    Title Patient will be able to perform HEP with assistance from husband to address balance, gait, transfers.  TARGET 01/02/17   Time 4   Period Weeks   Status On-going     PT SHORT TERM GOAL #2   Title Pt will perform at least 8 of 10 reps of sit<>stand transfers independently no posterior loss of balance.     Time 4   Period Weeks   Status Achieved     PT SHORT TERM GOAL #3   Title Pt will ambulate at least 150 ft with appropriate assistive device with supervision, for improved safety with household gait.   Time 4   Period Weeks   Status Achieved     PT SHORT TERM GOAL #4   Title NA     PT SHORT TERM GOAL #5   Title Pt/husband will verbalize/demonstrate understanding of tips to reduce freezing with gait and turns.   Time 4   Period Weeks   Status On-going           PT Long Term Goals - 12/03/16 1910      PT LONG TERM GOAL #1   Title Pt/husband will verbalize understanding of fall prevention in home environment.  6 wk goal-TARGET 02/02/17   Time 6   Period Weeks   Status New     PT LONG TERM GOAL #2   Title Berg Balance score to be assessed, with patient improving by at least 5 points for decreased fall risk.   Time 6   Period Weeks   Status New     PT LONG TERM GOAL #3   Title Pt will improve TUG score to less than or equal to 15 seconds to decrease fall risk.   Time 6   Period Weeks   Status New     PT LONG TERM GOAL #4   Title Pt will improve gait velocity to at least 1.8 ft/sec for improved gait efficiency and safety, with appropriate assistive device.   Time 6   Status New               Plan - 12/30/16 2440    Clinical Impression Statement Focused on reviewing pt's current HEP (from previous bout  of therapy), which is sit<>stand and standing PWR! Moves.  Made modifications to standing PWR! Moves (including d/c PWR! Twist and recommending using UE support at counter), for optimal safety.  Pt appears to have good control of rollator walker, needing  occasional cues for turning to improve foot clearance.  Pt will continue to benefit from fruther skilled therapy to address balance, turns, transfers.   Rehab Potential Fair   PT Frequency 2x / week   PT Duration 6 weeks   PT Treatment/Interventions ADLs/Self Care Home Management;Functional mobility training;Stair training;Gait training;DME Instruction;Therapeutic activities;Therapeutic exercise;Balance training;Neuromuscular re-education;Patient/family education   PT Next Visit Plan Review HEP updates provided this visit; continue to work on balance activities, transfers, and gait.   Consulted and Agree with Plan of Care Patient      Patient will benefit from skilled therapeutic intervention in order to improve the following deficits and impairments:  Abnormal gait, Decreased balance, Decreased mobility, Decreased safety awareness, Decreased strength, Difficulty walking, Postural dysfunction  Visit Diagnosis: Other abnormalities of gait and mobility  Unsteadiness on feet     Problem List Patient Active Problem List   Diagnosis Date Noted  . Elevated troponin 09/19/2016  . Emesis 09/19/2016  . UTI (urinary tract infection) 09/19/2016  . Sinus bradycardia 09/16/2016  . Family history of colon cancer 07/30/2014  . Hiatal hernia 07/30/2014  . Chest pain 10/13/2011  . Disequilibrium 10/13/2011  . Heart valve replaced by other means 04/16/2011  . Chronic anticoagulation 03/17/2011  . S/P AVR (aortic valve replacement)   . PVC's (premature ventricular contractions)   . HTN (hypertension)   . Hypercholesteremia   . Atrial fibrillation with RVR ([place])   . GERD (gastroesophageal reflux disease)   . GERD 02/06/2010  . DIVERTICULOSIS-COLON 02/06/2010  . PERSONAL HX COLONIC POLYPS 02/06/2010  . MASTECTOMY, HX OF 02/06/2010    Dolorez Jeffrey W. 12/30/2016, 9:52 AM  Frazier Butt., PT [facility] [address] [place], [place], [address] Phone: [phone]   Fax:  [phone]  Name: VANNARY GREENING MRN: [id number] Date of Birth: 17-Dec-1941

## 2017-01-04 ENCOUNTER — Ambulatory Visit: Payer: PPO | Admitting: Physical Therapy

## 2017-01-06 ENCOUNTER — Ambulatory Visit: Payer: PPO | Admitting: Physical Therapy

## 2017-01-06 DIAGNOSIS — R2689 Other abnormalities of gait and mobility: Secondary | ICD-10-CM | POA: Diagnosis not present

## 2017-01-06 DIAGNOSIS — R2681 Unsteadiness on feet: Secondary | ICD-10-CM

## 2017-01-06 NOTE — Therapy (Signed)
[facility] [address] [place], [place], [address] Phone: [phone]   Fax:  [phone]  Physical Therapy Treatment  Patient Details  Name: [name] MRN: [id number] Date of Birth: [birth year] Referring Provider: Alonza Bogus, DO  Encounter Date: 01/06/2017      PT End of Session - 01/06/17 0923    Visit Number 5   Number of Visits 12   Date for PT Re-Evaluation 02/02/17   Authorization Type HealthTeam-GCODE every 10th visit   PT Start Time 0845   PT Stop Time 0916   PT Time Calculation (min) 31 min   Activity Tolerance Patient tolerated treatment well   Behavior During Therapy [facility] for tasks assessed/performed      Past Medical History:  Diagnosis Date  . Anticoagulated on Coumadin   . Arthropathy of cervical facet joint    C2-3  . At risk for falls   . BPV (benign positional vertigo)   . Bruises easily   . Cancer ([place]) 2004    breast-lumpectomy,nodes ,radiation,chemo  . Chronic neck pain   . CKD (chronic kidney disease), stage II   . Diverticulosis of colon   . Elevated troponin    a. 08/2016 in setting of PAF;  b. 08/2016 low risk MV w/ fixed septal defect (LBBB), EF 53%.  Marland Kitchen GERD (gastroesophageal reflux disease)   . Hiatal hernia   . History of aortic valve stenosis   . History of breast cancer per pt no recurrence   dx 2004-- Left breast DCIS (ER/PR+, HER2 negative) s/p  partial masectomy w/ sln bx and  chemoradiotion (completed 2004)  . History of colon polyps   . History of herpes zoster   . History of kidney stones   . History of kidney stones   . HTN (hypertension)   . Hyperlipidemia   . Hypertrophy of inter-atrial septum    a. 09/2014 Echo: ? of atrial mass; b. 10/2014 Cardiac MRI: no atrial septal mass - polypoid lipomatous hypertrophy of superior atrial septum noted-->Benign.  . Irritable bowel syndrome   . LBBB (left bundle branch block)   . PAF (paroxysmal atrial fibrillation)  (HCC)    a. CHA2DS2VASc = 4-->coumadin.  . Parkinsonism [facility])    neurologist-  dr tat  . PONV (postoperative nausea and vomiting)   . PVC's (premature ventricular contractions)   . Right ureteral stone   . S/P aortic valve replacement with prosthetic valve    a. 04/13/2006 s/p St Jude mechnical AVR for severe AS;  b. 09/2014 Echo: EF 40-45%, gr1 DD, mild AI/MR, triv TR/PR.  . SUI (stress urinary incontinence, female)   . Unstable balance    uses cane  . Venous varices      Past Surgical History:  Procedure Laterality Date  . ANTERIOR AND POSTERIOR REPAIR  1990   cystocele/ rectocele  . AORTIC VALVE REPLACEMENT  04/13/06   dr gerhardt   and Replacement Ascending Aorta (St. Jude mechanical prosthesis)  . APPENDECTOMY  child 1950  . BREAST SURGERY Left 2004  . CARDIAC CATHETERIZATION  02-22-2001   dr Martinique   minimal non-obstructive cad/  mild aortic stenosis and aortic root enlargement/  normal LVF, ef 65%  . CARDIAC CATHETERIZATION  03-22-2006    dr Martinique   no significant obstructive cad/  severe aortic stenosis and mild to moderate aortic root enlargement/  upper normal right heart pressures  . CARDIOVASCULAR STRESS TEST  09-30-2011   dr Martinique   lexiscan nuclear  study w/ apical thinning  vs  small prior apical infarct w/ no significant ischemia/  hypokinesis of the distal septum and apex, ef 50%  . CARPAL TUNNEL RELEASE Bilateral left 09-09-2004/  right 2007  . CATARACT EXTRACTION W/ INTRAOCULAR LENS  IMPLANT, BILATERAL  2015  . COLONOSCOPY N/A 10/03/2014   Procedure: COLONOSCOPY;  Surgeon: [name], MD;  Location: [facility];  Service: Endoscopy;  Laterality: N/A;  . CYSTOSCOPY WITH RETROGRADE PYELOGRAM, URETEROSCOPY AND STENT PLACEMENT Right 09/17/2016   Procedure: CYSTOSCOPY WITH RIGHT RETROGRADE PYELOGRAM, RIGHT URETEROSCOPY AND STENT PLACEMENT LASER LITHOTRIPSY, RIGHT STENT PLACEMENT;  Surgeon: [name], MD;  Location: [facility];  Service:  Urology;  Laterality: Right;  . ESOPHAGEAL MANOMETRY N/A 08/12/2014   Procedure: ESOPHAGEAL MANOMETRY (EM);  Surgeon: [name], MD;  Location: [facility];  Service: Endoscopy;  Laterality: N/A;  . ESOPHAGOGASTRODUODENOSCOPY N/A 10/03/2014   Procedure: ESOPHAGOGASTRODUODENOSCOPY (EGD);  Surgeon: [name], MD;  Location: [facility] END[facility]COPY;  Service: Endoscopy;  Laterality: N/A;  . [place]  . FOOT SURGERY Right 2013   hammertoe x2  . HOLMIUM LASER APPLICATION Right 8/32/5498   Procedure: HOLMIUM LASER APPLICATION;  Surgeon: [name], MD;  Location: [facility];  Service: Urology;  Laterality: Right;  . PERCUTANEOUS NEPHROSTOLITHOTOMY  1993  . STONE EXTRACTION WITH BASKET Right 09/17/2016   Procedure: STONE EXTRACTION WITH BASKET;  Surgeon: [name], MD;  Location: [facility];  Service: Urology;  Laterality: Right;  . TOTAL ABDOMINAL HYSTERECTOMY  1971   w/ Bilateral Salpingoophorectomy  . TRANSTHORACIC ECHOCARDIOGRAM  10/24/2014   dr Martinique   hypokinesis of the basal and mid inferoseptal and inferior walls,  ef 40-45%,  grade 1 diastolic dysfunction/  mechanical bileaflet aortic valve noted w/ mild regur., peak grandient 63mHg, valve area 1.35cm^2/  severe MV calcification without stenosis w/ mild regurg., peak gradient 374mg/  mild LAE/  poss. atrial mass (MRI showed benign lipomatous hypertrophy of atrial septum) /tFrazier ButtR/mild TR    There were no vitals filed for this visit.      Subjective Assessment - 01/06/17 0847    Subjective No changes, no falls since last visit.  Using the walker and it's working out well.   Patient Stated Goals Pt's goal for therapy is to find the right type of walker to help prevent falls.   Currently in Pain? No/denies                         [facility]/Exercise - 01/06/17 0848      Transfers   Transfers Sit to Stand;Stand to Sit    Sit to Stand 6: Modified independent (Device/Increase time);5: Supervision   Sit to Stand Details (indicate cue type and reason) Initial cues for forward lean and upright posture to stand   Stand to Sit 6: Modified independent (Device/Increase time);5: Supervision   Number of Reps 10 reps;Other sets (comment)  3 reps, from 20", 18", 16" surfaces     Ambulation/Gait   Ambulation/Gait Yes   Ambulation/Gait Assistance 5: Supervision   Ambulation Distance (Feet) 400 Feet  then 120 ft x 2   Assistive device Rollator   Gait Pattern Step-through pattern;Decreased step length - right;Decreased step length - left;Shuffle;Festinating;Narrow base of support;Poor foot clearance - left;Poor foot clearance - right   Ambulation Surface Level;Indoor   Gait velocity 13.30 sec = 2.47 ft/sec   Gait  Comments Gait activities with conversational tasks, with 2 episodes of hitting object on L side with turn towards R.  Pt able to adjust walker to complete turn.  Discussed/reviewed tips to reduce freezing.  Pt able to verbalize 3 tips, but needs cues with tight turns to increase foot clearance.     Standardized Balance Assessment   Standardized Balance Assessment Timed Up and Go Test     Timed Up and Go Test   TUG Normal TUG   Normal TUG (seconds) 22.63  2nd trial:  20.27 sec           [facility]) - 01/06/17 5784    PWR! exercises Moves in standing   PWR! Up x 20   PWR! Rock x 20   PWR Step x 10 reps each side, then posterior step at counter, x 10 reps each side   Comments Review of HEP from last visit-pt requires supervision and min cues for technique.  Provided cues for definite use of UE support for step activity to side and back     Cues for slowed pace and for hold in each position.          PT Short Term Goals - 01/06/17 0902      PT SHORT TERM GOAL #1   Title Patient will be able to perform HEP with assistance from husband to address balance, gait, transfers.  TARGET 01/02/17   Time 4    Period Weeks   Status Achieved     PT SHORT TERM GOAL #2   Title Pt will perform at least 8 of 10 reps of sit<>stand transfers independently no posterior loss of balance.     Time 4   Period Weeks   Status Achieved     PT SHORT TERM GOAL #3   Title Pt will ambulate at least 150 ft with appropriate assistive device with supervision, for improved safety with household gait.   Time 4   Period Weeks   Status Achieved     PT SHORT TERM GOAL #4   Title NA     PT SHORT TERM GOAL #5   Title Pt/husband will verbalize/demonstrate understanding of tips to reduce freezing with gait and turns.   Time 4   Period Weeks   Status Achieved           PT Long Term Goals - 12/03/16 1910      PT LONG TERM GOAL #1   Title Pt/husband will verbalize understanding of fall prevention in home environment.  6 wk goal-TARGET 02/02/17   Time 6   Period Weeks   Status New     PT LONG TERM GOAL #2   Title Berg Balance score to be assessed, with patient improving by at least 5 points for decreased fall risk.   Time 6   Period Weeks   Status New     PT LONG TERM GOAL #3   Title Pt will improve TUG score to less than or equal to 15 seconds to decrease fall risk.   Time 6   Period Weeks   Status New     PT LONG TERM GOAL #4   Title Pt will improve gait velocity to at least 1.8 ft/sec for improved gait efficiency and safety, with appropriate assistive device.   Time 6   Status New               Plan - 01/06/17 6962    Clinical Impression Statement Reviewed standing HEP from  last visit, with pt performing well, with minimal cues.  Continue to recommend UE support for optimal performance of standing balance and PWR! Moves activities.  Pt has met STG 1, 5 and gait velocity LTG.  Likely will look to check remaining goals and discharge PT next visit.   Rehab Potential Fair   PT Frequency 2x / week   PT Duration 6 weeks   PT Treatment/Interventions ADLs/Self Care Home Management;Functional  mobility training;Stair training;Gait training;DME Instruction;Therapeutic activities;Therapeutic exercise;Balance training;Neuromuscular re-education;Patient/family education   PT Next Visit Plan Check remaining goals, plan for discharge next visit.   Consulted and Agree with Plan of Care Patient      Patient will benefit from skilled therapeutic intervention in order to improve the following deficits and impairments:  Abnormal gait, Decreased balance, Decreased mobility, Decreased safety awareness, Decreased strength, Difficulty walking, Postural dysfunction  Visit Diagnosis: Other abnormalities of gait and mobility  Unsteadiness on feet     Problem List Patient Active Problem List   Diagnosis Date Noted  . Elevated troponin 09/19/2016  . Emesis 09/19/2016  . UTI (urinary tract infection) 09/19/2016  . Sinus bradycardia 09/16/2016  . Family history of colon cancer 07/30/2014  . Hiatal hernia 07/30/2014  . Chest pain 10/13/2011  . Disequilibrium 10/13/2011  . Heart valve replaced by other means 04/16/2011  . Chronic anticoagulation 03/17/2011  . S/P AVR (aortic valve replacement)   . PVC's (premature ventricular contractions)   . HTN (hypertension)   . Hypercholesteremia   . Atrial fibrillation with RVR ([place])   . GERD (gastroesophageal reflux disease)   . GERD 02/06/2010  . DIVERTICULOSIS-COLON 02/06/2010  . PERSONAL HX COLONIC POLYPS 02/06/2010  . MASTECTOMY, HX OF 02/06/2010    Hargun Spurling W. 01/06/2017, 9:29 AM  Frazier Butt., PT [facility] [address] [place], [place], [address] Phone: [phone]   Fax:  [phone]  Name: [name] MRN: [id number] Date of Birth: 26-Jul-1941

## 2017-01-20 ENCOUNTER — Ambulatory Visit: Payer: PPO

## 2017-01-20 ENCOUNTER — Ambulatory Visit: Payer: PPO | Admitting: Occupational Therapy

## 2017-01-20 ENCOUNTER — Ambulatory Visit: Payer: PPO | Admitting: Physical Therapy

## 2017-01-20 DIAGNOSIS — R278 Other lack of coordination: Secondary | ICD-10-CM

## 2017-01-20 DIAGNOSIS — R2681 Unsteadiness on feet: Secondary | ICD-10-CM

## 2017-01-20 DIAGNOSIS — R29818 Other symptoms and signs involving the nervous system: Secondary | ICD-10-CM

## 2017-01-20 DIAGNOSIS — R29898 Other symptoms and signs involving the musculoskeletal system: Secondary | ICD-10-CM

## 2017-01-20 DIAGNOSIS — R2689 Other abnormalities of gait and mobility: Secondary | ICD-10-CM

## 2017-01-20 DIAGNOSIS — R471 Dysarthria and anarthria: Secondary | ICD-10-CM

## 2017-01-20 DIAGNOSIS — R41844 Frontal lobe and executive function deficit: Secondary | ICD-10-CM

## 2017-01-20 NOTE — Therapy (Signed)
[facility] [address] [place], [place], [address] Phone: [phone]   Fax:  [phone]  Speech Language Pathology Evaluation  Patient Details  Name: [name] MRN: [id number] Date of Birth: 03/14/1941 Referring Provider: Alonza Bogus DO  Encounter Date: 01/20/2017      End of Session - 01/20/17 1012    Visit Number 1   Number of Visits 17   Date for SLP Re-Evaluation 03/25/17      Past Medical History:  Diagnosis Date  . Anticoagulated on Coumadin   . Arthropathy of cervical facet joint    C2-3  . At risk for falls   . BPV (benign positional vertigo)   . Bruises easily   . Cancer ([place]) 2004    breast-lumpectomy,nodes ,radiation,chemo  . Chronic neck pain   . CKD (chronic kidney disease), stage II   . Diverticulosis of colon   . Elevated troponin    a. 08/2016 in setting of PAF;  b. 08/2016 low risk MV w/ fixed septal defect (LBBB), EF 53%.  Marland Kitchen GERD (gastroesophageal reflux disease)   . Hiatal hernia   . History of aortic valve stenosis   . History of breast cancer per pt no recurrence   dx 2004-- Left breast DCIS (ER/PR+, HER2 negative) s/p  partial masectomy w/ sln bx and  chemoradiotion (completed 2004)  . History of colon polyps   . History of herpes zoster   . History of kidney stones   . History of kidney stones   . HTN (hypertension)   . Hyperlipidemia   . Hypertrophy of inter-atrial septum    a. 09/2014 Echo: ? of atrial mass; b. 10/2014 Cardiac MRI: no atrial septal mass - polypoid lipomatous hypertrophy of superior atrial septum noted-->Benign.  . Irritable bowel syndrome   . LBBB (left bundle branch block)   . PAF (paroxysmal atrial fibrillation) (HCC)    a. CHA2DS2VASc = 4-->coumadin.  . Parkinsonism [facility])    neurologist-  dr tat  . PONV (postoperative nausea and vomiting)   . PVC's (premature ventricular contractions)   . Right ureteral stone   . S/P aortic valve replacement  with prosthetic valve    a. 04/13/2006 s/p St Jude mechnical AVR for severe AS;  b. 09/2014 Echo: EF 40-45%, gr1 DD, mild AI/MR, triv TR/PR.  . SUI (stress urinary incontinence, female)   . Unstable balance    uses cane  . Venous varices      Past Surgical History:  Procedure Laterality Date  . ANTERIOR AND POSTERIOR REPAIR  1990   cystocele/ rectocele  . AORTIC VALVE REPLACEMENT  04/13/06   dr gerhardt   and Replacement Ascending Aorta (St. Jude mechanical prosthesis)  . APPENDECTOMY  child 1950  . BREAST SURGERY Left 2004  . CARDIAC CATHETERIZATION  02-22-2001   dr Martinique   minimal non-obstructive cad/  mild aortic stenosis and aortic root enlargement/  normal LVF, ef 65%  . CARDIAC CATHETERIZATION  03-22-2006    dr Martinique   no significant obstructive cad/  severe aortic stenosis and mild to moderate aortic root enlargement/  upper normal right heart pressures  . CARDIOVASCULAR STRESS TEST  09-30-2011   dr Martinique   lexiscan nuclear study w/ apical thinning  vs  small prior apical infarct w/ no significant ischemia/  hypokinesis of the distal septum and apex, ef 50%  . CARPAL TUNNEL RELEASE Bilateral left 09-09-2004/  right 2007  . CATARACT EXTRACTION W/ INTRAOCULAR LENS  IMPLANT,  BILATERAL  2015  . COLONOSCOPY N/A 10/03/2014   Procedure: COLONOSCOPY;  Surgeon: [name], MD;  Location: [facility];  Service: Endoscopy;  Laterality: N/A;  . CYSTOSCOPY WITH RETROGRADE PYELOGRAM, URETEROSCOPY AND STENT PLACEMENT Right 09/17/2016   Procedure: CYSTOSCOPY WITH RIGHT RETROGRADE PYELOGRAM, RIGHT URETEROSCOPY AND STENT PLACEMENT LASER LITHOTRIPSY, RIGHT STENT PLACEMENT;  Surgeon: [name], MD;  Location: [facility];  Service: Urology;  Laterality: Right;  . ESOPHAGEAL MANOMETRY N/A 08/12/2014   Procedure: ESOPHAGEAL MANOMETRY (EM);  Surgeon: [name], MD;  Location: [facility];  Service: Endoscopy;  Laterality: N/A;  . ESOPHAGOGASTRODUODENOSCOPY N/A 10/03/2014    Procedure: ESOPHAGOGASTRODUODENOSCOPY (EGD);  Surgeon: [name], MD;  Location: [facility];  Service: Endoscopy;  Laterality: N/A;  . [place]  . FOOT SURGERY Right 2013   hammertoe x2  . HOLMIUM LASER APPLICATION Right 06/11/736   Procedure: HOLMIUM LASER APPLICATION;  Surgeon: [name], MD;  Location: [facility];  Service: Urology;  Laterality: Right;  . PERCUTANEOUS NEPHROSTOLITHOTOMY  1993  . STONE EXTRACTION WITH BASKET Right 09/17/2016   Procedure: STONE EXTRACTION WITH BASKET;  Surgeon: [name], MD;  Location: [facility];  Service: Urology;  Laterality: Right;  . TOTAL ABDOMINAL HYSTERECTOMY  1971   w/ Bilateral Salpingoophorectomy  . TRANSTHORACIC ECHOCARDIOGRAM  10/24/2014   dr Martinique   hypokinesis of the basal and mid inferoseptal and inferior walls,  ef 40-45%,  grade 1 diastolic dysfunction/  mechanical bileaflet aortic valve noted w/ mild regur., peak grandient 42mHg, valve area 1.35cm^2/  severe MV calcification without stenosis w/ mild regurg., peak gradient 349mg/  mild LAE/  poss. atrial mass (MRI showed benign lipomatous hypertrophy of atrial septum) /tFrazier ButtR/mild TR    There were no vitals filed for this visit.      Subjective Assessment - 01/20/17 0936    Subjective Pt diagnosed <6 months ago, says husband asks her to repeat.    Currently in Pain? Yes   Pain Score 3    Pain Location Wrist   Pain Orientation Right   Pain Descriptors / Indicators Sore   Pain Type Acute pain   Pain Onset Today   Pain Frequency Constant   Aggravating Factors  movement   Pain Relieving Factors nothing            SLP Evaluation [facility] - 01/20/17 091062    SLP Visit Information   SLP Received On 01/20/17   Referring Provider Tat, ReWells GuilesO   Onset Date > 6 months ago   Medical Diagnosis Parkinsonism     Prior Functional Status   Cognitive/Linguistic Baseline Within  functional limits     Cognition   Overall Cognitive Status Within Functional Limits for tasks assessed     Verbal Expression   Overall Verbal Expression Appears within functional limits for tasks assessed     Oral Motor/Sensory Function   Overall Oral Motor/Sensory Function Impaired   Labial ROM Within Functional Limits   Labial Symmetry Abnormal symmetry left   Labial Strength Within Functional Limits   Labial Coordination Reduced  sluggish   Lingual ROM Reduced right   Lingual Symmetry Within Functional Limits   Lingual Strength Reduced Right   Lingual Coordination Reduced   Facial Symmetry Within Functional Limits   Facial Coordination Reduced   Overall Oral Motor/Sensory Function Slower movement with articulators on alternating motion tasks.      Motor Speech  Overall Motor Speech Impaired   Respiration Impaired  talks on residual air approx 20% of the time - conversation   Level of Impairment Phrase   Articulation Impaired   Level of Impairment Sentence   Intelligibility Intelligibility reduced   Word --  100%   Phrase --  95%+   Sentence --  95%   Conversation --  95%   Effective Techniques Increased vocal intensity       Five minutes of conversational speech was reduced today, at average 68dB (WNL= average 70-72dB) with range of 64-72dB, when a sound level meter was placed 30 cm away from pt's mouth. Overall intelligibility for this listener in a quiet environment was approx 95% due to rushes of speech and pt speaking on residual volume approx 20% of the time. Production of loud /a/ averaged in the low 90s dB (range of 84 to 92) and min cues occasionally needed for loudness. Pt rated effort level at 8-9/10 for production of loud /a/ (10=maximal effort).  In conversation for 7 minutes, pt was asked to use the same amount of effort as with loud /a/. Loudness average with this increased effort was 70dB (range of 67 to 74) with min A occasionally req'd for maintaining  loudness. Notably, no rushes of speech nor pt speaking on residual volume were noted during this short conversation. Pt would benefit from skilled ST in order to improve speech intelligibility and pt's QOL.  Pt had modified barium swallow in May 2017, with results revealed as swallowing within functional limits, mild aspiration risk.                      SLP Education - 01/20/17 1011    Education provided Yes   Education Details rationale for louder speech, norms for WNL loudness in conversation, breath support in speech, loud /a/, use louder speech   Person(s) Educated Patient   Methods Explanation;Demonstration   Comprehension Verbalized understanding;Verbal cues required;Need further instruction          SLP Short Term Goals - 01/20/17 1216      SLP SHORT TERM GOAL #1   Title pt will produce loud /a/ with proper voicing at average low 90's dB over three sessions   Time 4   Period Weeks   Status New     SLP SHORT TERM GOAL #2   Title pt will converse for 10 minutes re: simple topic with appropriate breath support for average volume in low 70s dB.   Time 4   Period Weeks   Status New     SLP SHORT TERM GOAL #3   Title pt will exhibit no greater than 2 episodes of rushing of speech in 10 minutes simple conversation over three sessions   Time 4   Period Weeks   Status New          SLP Long Term Goals - 01/20/17 1218      SLP LONG TERM GOAL #1   Title pt will demo no more than one episode of rushing of speech in 15 minutes mod complex conversation over two sessions   Time 8   Period Weeks   Status New     SLP LONG TERM GOAL #2   Title pt will achieve 15 minutes mod complex conversation with appropriate breath support to allow no > 1 instance of speaking on residual air   Time 8   Period Weeks   Status New     SLP LONG TERM  GOAL #3   Title pt will sustain /a/ at average low 90s dB over 6 sessions   Time 8   Period Weeks   Status New           Plan - 02/08/17 1213    Clinical Impression Statement Pt presents with mild dysarthria characterized by rare to occasional short rushes of speech, reduced conversational volume due to reduced breath support which decreases pt's overall communicative effectiveness.Pt reports her husband has had to ask her to repeat herslef more in the last 6-12- months. She would benefit from skilled ST addressing these deficits in order to improve her ability to communicate with family and in the community. Swallowing skills will be  monitored.   Speech Therapy Frequency 2x / week   Duration --  8 weeks   Treatment/Interventions Compensatory strategies;Functional tasks;Patient/family education;Cueing hierarchy;Internal/external aids;SLP instruction and feedback   Potential to Achieve Goals Good      Patient will benefit from skilled therapeutic intervention in order to improve the following deficits and impairments:   Dysarthria and anarthria  Oropharyngeal dysphagia      G-Codes - February 08, 2017 1224    Functional Assessment Tool Used NOMS -25% impaired   Functional Limitations Motor speech   Motor Speech Current Status [phone]) At least 20 percent but less than 40 percent impaired, limited or restricted   Motor Speech Goal Status (C1275) At least 1 percent but less than 20 percent impaired, limited or restricted      Problem List Patient Active Problem List   Diagnosis Date Noted  . Elevated troponin 09/19/2016  . Emesis 09/19/2016  . UTI (urinary tract infection) 09/19/2016  . Sinus bradycardia 09/16/2016  . Family history of colon cancer 07/30/2014  . Hiatal hernia 07/30/2014  . Chest pain 10/13/2011  . Disequilibrium 10/13/2011  . Heart valve replaced by other means 04/16/2011  . Chronic anticoagulation 03/17/2011  . S/P AVR (aortic valve replacement)   . PVC's (premature ventricular contractions)   . HTN (hypertension)   . Hypercholesteremia   . Atrial fibrillation with RVR ([place])   . GERD  (gastroesophageal reflux disease)   . GERD 02/06/2010  . DIVERTICULOSIS-COLON 02/06/2010  . PERSONAL HX COLONIC POLYPS 02/06/2010  . MASTECTOMY, HX OF 02/06/2010    [facility] ,MS, CCC-SLP  08-Feb-2017, 12:24 PM  [facility] [address] [place], [place], [address] Phone: [phone]   Fax:  [phone]  Name: JOANE POSTEL MRN: [id number] Date of Birth: 04/13/1941

## 2017-01-20 NOTE — Therapy (Signed)
[facility] [address] [place], [place], [address] Phone: [phone]   Fax:  [phone]  Physical Therapy Treatment  Patient Details  Name: [name] MRN: [id number] Date of Birth: March 31, 1941 Referring Provider: Alonza Bogus, DO  Encounter Date: 01/20/2017      PT End of Session - 01/20/17 2145    Visit Number 6   Number of Visits 12   Date for PT Re-Evaluation 02/02/17   Authorization Type [employer] every 10th visit   PT Start Time 0850   PT Stop Time 0929   PT Time Calculation (min) 39 min   Activity Tolerance Patient tolerated treatment well   Behavior During Therapy [facility] for tasks assessed/performed      Past Medical History:  Diagnosis Date  . Anticoagulated on Coumadin   . Arthropathy of cervical facet joint    C2-3  . At risk for falls   . BPV (benign positional vertigo)   . Bruises easily   . Cancer ([place]) 2004    breast-lumpectomy,nodes ,radiation,chemo  . Chronic neck pain   . CKD (chronic kidney disease), stage II   . Diverticulosis of colon   . Elevated troponin    a. 08/2016 in setting of PAF;  b. 08/2016 low risk MV w/ fixed septal defect (LBBB), EF 53%.  Marland Kitchen GERD (gastroesophageal reflux disease)   . Hiatal hernia   . History of aortic valve stenosis   . History of breast cancer per pt no recurrence   dx 2004-- Left breast DCIS (ER/PR+, HER2 negative) s/p  partial masectomy w/ sln bx and  chemoradiotion (completed 2004)  . History of colon polyps   . History of herpes zoster   . History of kidney stones   . History of kidney stones   . HTN (hypertension)   . Hyperlipidemia   . Hypertrophy of inter-atrial septum    a. 09/2014 Echo: ? of atrial mass; b. 10/2014 Cardiac MRI: no atrial septal mass - polypoid lipomatous hypertrophy of superior atrial septum noted-->Benign.  . Irritable bowel syndrome   . LBBB (left bundle branch block)   . PAF (paroxysmal atrial fibrillation)  (HCC)    a. CHA2DS2VASc = 4-->coumadin.  . Parkinsonism [facility])    neurologist-  dr tat  . PONV (postoperative nausea and vomiting)   . PVC's (premature ventricular contractions)   . Right ureteral stone   . S/P aortic valve replacement with prosthetic valve    a. 04/13/2006 s/p St Jude mechnical AVR for severe AS;  b. 09/2014 Echo: EF 40-45%, gr1 DD, mild AI/MR, triv TR/PR.  . SUI (stress urinary incontinence, female)   . Unstable balance    uses cane  . Venous varices      Past Surgical History:  Procedure Laterality Date  . ANTERIOR AND POSTERIOR REPAIR  1990   cystocele/ rectocele  . AORTIC VALVE REPLACEMENT  04/13/06   dr gerhardt   and Replacement Ascending Aorta (St. Jude mechanical prosthesis)  . APPENDECTOMY  child 1950  . BREAST SURGERY Left 2004  . CARDIAC CATHETERIZATION  02-22-2001   dr [place]   minimal non-obstructive cad/  mild aortic stenosis and aortic root enlargement/  normal LVF, ef 65%  . CARDIAC CATHETERIZATION  03-22-2006    dr Martinique   no significant obstructive cad/  severe aortic stenosis and mild to moderate aortic root enlargement/  upper normal right heart pressures  . CARDIOVASCULAR STRESS TEST  09-30-2011   dr Martinique   lexiscan nuclear  study w/ apical thinning  vs  small prior apical infarct w/ no significant ischemia/  hypokinesis of the distal septum and apex, ef 50%  . CARPAL TUNNEL RELEASE Bilateral left 09-09-2004/  right 2007  . CATARACT EXTRACTION W/ INTRAOCULAR LENS  IMPLANT, BILATERAL  2015  . COLONOSCOPY N/A 10/03/2014   Procedure: COLONOSCOPY;  Surgeon: [name], MD;  Location: [place];  Service: Endoscopy;  Laterality: N/A;  . CYSTOSCOPY WITH RETROGRADE PYELOGRAM, URETEROSCOPY AND STENT PLACEMENT Right 09/17/2016   Procedure: CYSTOSCOPY WITH RIGHT RETROGRADE PYELOGRAM, RIGHT URETEROSCOPY AND STENT PLACEMENT LASER LITHOTRIPSY, RIGHT STENT PLACEMENT;  Surgeon: [name], MD;  Location: [facility];  Service:  Urology;  Laterality: Right;  . ESOPHAGEAL MANOMETRY N/A 08/12/2014   Procedure: ESOPHAGEAL MANOMETRY (EM);  Surgeon: [name], MD;  Location: [facility];  Service: Endoscopy;  Laterality: N/A;  . ESOPHAGOGASTRODUODENOSCOPY N/A 10/03/2014   Procedure: ESOPHAGOGASTRODUODENOSCOPY (EGD);  Surgeon: [name], MD;  Location: [facility];  Service: Endoscopy;  Laterality: N/A;  . [place]  . FOOT SURGERY Right 2013   hammertoe x2  . HOLMIUM LASER APPLICATION Right 08/27/5175   Procedure: HOLMIUM LASER APPLICATION;  Surgeon: [name], MD;  Location: [facility];  Service: Urology;  Laterality: Right;  . PERCUTANEOUS NEPHROSTOLITHOTOMY  1993  . STONE EXTRACTION WITH BASKET Right 09/17/2016   Procedure: STONE EXTRACTION WITH BASKET;  Surgeon: [name], MD;  [facility];  Service: Urology;  Laterality: Right;  . TOTAL ABDOMINAL HYSTERECTOMY  1971   w/ Bilateral Salpingoophorectomy  . TRANSTHORACIC ECHOCARDIOGRAM  10/24/2014   dr [place]   hypokinesis of the basal and mid inferoseptal and inferior walls,  ef 40-45%,  grade 1 diastolic dysfunction/  mechanical bileaflet aortic valve noted w/ mild regur., peak grandient 63mHg, valve area 1.35cm^2/  severe MV calcification without stenosis w/ mild regurg., peak gradient 383mg/  mild LAE/  poss. atrial mass (MRI showed benign lipomatous hypertrophy of atrial septum) /tFrazier ButtR/mild TR    There were no vitals filed for this visit.      Subjective Assessment - 01/20/17 0852    Subjective No falls, no changes since last visit.   Patient Stated Goals Pt's goal for therapy is to find the right type of walker to help prevent falls.   Currently in Pain? No/denies                         [facility] Adult PT Treatment/Exercise - 01/20/17 0001      Transfers   Transfers Sit to Stand;Stand to Sit   Sit to Stand 6: Modified independent  (Device/Increase time);5: Supervision   Sit to Stand Details (indicate cue type and reason) Cues for hand placement, need to lock brakes on rollator   Stand to Sit 5: Supervision;6: Modified independent (Device/Increase time)   Number of Reps 10 reps  trhoughout session     Ambulation/Gait   Ambulation/Gait Yes   Ambulation/Gait Assistance 5: Supervision   Ambulation Distance (Feet) 200 Feet  then 300 ft, then 150 ft   Assistive device Rollator   Gait Pattern Step-through pattern;Decreased step length - right;Decreased step length - left;Shuffle;Festinating;Narrow base of support;Poor foot clearance - left;Poor foot clearance - right   Ambulation Surface Level;Indoor   Gait velocity 12.87 sec (2.55 ft/sec   Pre-Gait Activities Cues for marching in place for improved foot clearance for turns.   Gait Comments  Gait activities in gym area, including turns around furniture, as well as 180-360 turns using rollator, with supervision and cues for foot clearance during turns.     Standardized Balance Assessment   Standardized Balance Assessment Berg Balance Test;Timed Up and Go Test     Berg Balance Test   Sit to Stand Able to stand without using hands and stabilize independently   Standing Unsupported Able to stand safely 2 minutes   Sitting with Back Unsupported but Feet Supported on Floor or Stool Able to sit safely and securely 2 minutes   Stand to Sit Sits safely with minimal use of hands   Transfers Able to transfer safely, minor use of hands   Standing Unsupported with Eyes Closed Able to stand 10 seconds safely   Standing Ubsupported with Feet Together Able to place feet together independently and stand 1 minute safely   From Standing, Reach Forward with Outstretched Arm Can reach confidently >25 cm (10")   From Standing Position, Pick up Object from Floor Able to pick up shoe safely and easily   From Standing Position, Turn to Look Behind Over each Shoulder Looks behind from both sides  and weight shifts well   Turn 360 Degrees Able to turn 360 degrees safely but slowly   Standing Unsupported, Alternately Place Feet on Step/Stool Able to complete 4 steps without aid or supervision   Standing Unsupported, One Foot in Front Able to take small step independently and hold 30 seconds   Standing on One Leg Tries to lift leg/unable to hold 3 seconds but remains standing independently   Total Score 47     Timed Up and Go Test   TUG Normal TUG   Normal TUG (seconds) 18.94  cues after TUG for proper hand placement     Self-Care   Self-Care Other Self-Care Comments   Other Self-Care Comments  Reviewed tips to reduce freezing and tips for technique for sit<>stand, and discussed fall prevention education in home environment.  Provided written handouts, as pt's husband not present for PT session.  Discussed progress to goals, safety with continued need to use walker and plans for discharge this visit.                PT Education - 01/20/17 2143    Education provided Yes   Education Details Fall prevention, reviewed tips to reduce freezing with gait/transfer technique; discussed progress towards goals, plans for D/C this visit   Person(s) Educated Patient   Methods Explanation;Handout   Comprehension Verbalized understanding          PT Short Term Goals - 01/06/17 0902      PT SHORT TERM GOAL #1   Title Patient will be able to perform HEP with assistance from husband to address balance, gait, transfers.  TARGET 01/02/17   Time 4   Period Weeks   Status Achieved     PT SHORT TERM GOAL #2   Title Pt will perform at least 8 of 10 reps of sit<>stand transfers independently no posterior loss of balance.     Time 4   Period Weeks   Status Achieved     PT SHORT TERM GOAL #3   Title Pt will ambulate at least 150 ft with appropriate assistive device with supervision, for improved safety with household gait.   Time 4   Period Weeks   Status Achieved     PT SHORT TERM  GOAL #4   Title NA     PT  SHORT TERM GOAL #5   Title Pt/husband will verbalize/demonstrate understanding of tips to reduce freezing with gait and turns.   Time 4   Period Weeks   Status Achieved           PT Long Term Goals - February 08, 2017 9485      PT LONG TERM GOAL #1   Title Pt/husband will verbalize understanding of fall prevention in home environment.  6 wk goal-TARGET 02/02/17   Time 6   Period Weeks   Status Achieved     PT LONG TERM GOAL #2   Title Berg Balance score to be assessed, with patient improving by at least 5 points for decreased fall risk.   Baseline Berg improved from 44/56 to 47/56 02/08/17   Time 6   Period Weeks   Status Not Met     PT LONG TERM GOAL #3   Title Pt will improve TUG score to less than or equal to 15 seconds to decrease fall risk.   Baseline TUG with rollator 18.94 sec 2017/02/08   Time 6   Period Weeks   Status Not Met     PT LONG TERM GOAL #4   Title Pt will improve gait velocity to at least 1.8 ft/sec for improved gait efficiency and safety, with appropriate assistive device.   Baseline Gait velocity 2.55 ft/sec with rollator   Time 6   Status Achieved               Plan - 02/08/17 February 09, 2144    Clinical Impression Statement Pt has met LTG 1, 4.  LTG 2 and 3 not met, but pt has improved slightly on Berg and TUG scores.  Pt remains at fall risk per TUG score.  Pt is using rollator walker at all times and has not had any recent falls.  Pt is appropriate for d/c from PT   Rehab Potential Fair   PT Frequency 2x / week   PT Duration 6 weeks   PT Treatment/Interventions ADLs/Self Care Home Management;Functional mobility training;Stair training;Gait training;DME Instruction;Therapeutic activities;Therapeutic exercise;Balance training;Neuromuscular re-education;Patient/family education   PT Next Visit Plan Discharge this visit.   Consulted and Agree with Plan of Care Patient      Patient will benefit from skilled therapeutic intervention in  order to improve the following deficits and impairments:  Abnormal gait, Decreased balance, Decreased mobility, Decreased safety awareness, Decreased strength, Difficulty walking, Postural dysfunction  Visit Diagnosis: Other abnormalities of gait and mobility  Unsteadiness on feet       G-Codes - 2017-02-08 February 09, 2156    Functional Assessment Tool Used TUG 18.94 sec, Berg 47/56, gait velocity 2.55 ft/sec with rollator.     Functional Limitation Mobility: Walking and moving around   Mobility: Walking and Moving Around Goal Status [phone]) At least 20 percent but less than 40 percent impaired, limited or restricted   Mobility: Walking and Moving Around Discharge Status [phone]) At least 20 percent but less than 40 percent impaired, limited or restricted      Problem List Patient Active Problem List   Diagnosis Date Noted  . Elevated troponin 09/19/2016  . Emesis 09/19/2016  . UTI (urinary tract infection) 09/19/2016  . Sinus bradycardia 09/16/2016  . Family history of colon cancer 07/30/2014  . Hiatal hernia 07/30/2014  . Chest pain 10/13/2011  . Disequilibrium 10/13/2011  . Heart valve replaced by other means 04/16/2011  . Chronic anticoagulation 03/17/2011  . S/P AVR (aortic valve replacement)   . PVC's (premature ventricular contractions)   .  HTN (hypertension)   . Hypercholesteremia   . Atrial fibrillation with RVR ([place])   . GERD (gastroesophageal reflux disease)   . GERD 02/06/2010  . DIVERTICULOSIS-COLON 02/06/2010  . PERSONAL HX COLONIC POLYPS 02/06/2010  . MASTECTOMY, HX OF 02/06/2010    MARRIOTT,AMY W. 01/20/2017, 9:59 PM Frazier Butt., PT [place] [address] [place], [place], [address] Phone: [phone]   Fax:  [phone]  Name: [name] MRN: [id number] Date of Birth: 1941/01/08   PHYSICAL THERAPY DISCHARGE SUMMARY  Visits from Start of Care: 6  Current functional level related to goals  / functional outcomes:     PT Long Term Goals - 01/20/17 6761      PT LONG TERM GOAL #1   Title Pt/husband will verbalize understanding of fall prevention in home environment.  6 wk goal-TARGET 02/02/17   Time 6   Period Weeks   Status Achieved     PT LONG TERM GOAL #2   Title Berg Balance score to be assessed, with patient improving by at least 5 points for decreased fall risk.   Baseline Berg improved from 44/56 to 47/56 01/20/17   Time 6   Period Weeks   Status Not Met     PT LONG TERM GOAL #3   Title Pt will improve TUG score to less than or equal to 15 seconds to decrease fall risk.   Baseline TUG with rollator 18.94 sec 01/20/17   Time 6   Period Weeks   Status Not Met     PT LONG TERM GOAL #4   Title Pt will improve gait velocity to at least 1.8 ft/sec for improved gait efficiency and safety, with appropriate assistive device.   Baseline Gait velocity 2.55 ft/sec with rollator   Time 6   Status Achieved    Pt has met 2 of 4 LTGs.  Pt had met all STGs.   Remaining deficits: Freezing episodes, decreased balance, decreased safety awareness with gait/transfers (overall gait safety improved with use of rollator walker)   Education / Equipment: Pt has been educated in HEP, fall prevention, proper use of rollator walker.  Plan: Patient agrees to discharge.  Patient goals were partially met. Patient is being discharged due to being pleased with the current functional level.  ?????Recommend PT screen versus PT eval in 6-9 months due to progressive nature of disease.  Mady Haagensen, PT 01/20/17 10:04 PM Phone: [phone] Fax: [phone]

## 2017-01-20 NOTE — Patient Instructions (Signed)
   Read out loud for 2-3 minutes twice a day  (whenever you talk with your husband, talk LOUDER)

## 2017-01-20 NOTE — Therapy (Signed)
[facility] [address] [place], [place], [address] Phone: [phone]   Fax:  [phone]  Occupational Therapy Evaluation  Patient Details  Name: [name] MRN: [id number] Date of Birth: 1941/11/12 Referring Provider: Dr. Wells Guiles Tat  Encounter Date: 01/20/2017      OT End of Session - 01/20/17 2051    Visit Number 1   Number of Visits 17   Date for OT Re-Evaluation 03/19/17   Authorization Type [employer], no visit limit/no auth, G-code needed   Authorization - Visit Number 1   Authorization - Number of Visits 10   OT Start Time 0805   OT Stop Time 0845   OT Time Calculation (min) 40 min   Activity Tolerance Patient tolerated treatment well   Behavior During Therapy [facility] for tasks assessed/performed      Past Medical History:  Diagnosis Date  . Anticoagulated on Coumadin   . Arthropathy of cervical facet joint    C2-3  . At risk for falls   . BPV (benign positional vertigo)   . Bruises easily   . Cancer ([facility] [place]) 2004    breast-lumpectomy,nodes ,radiation,chemo  . Chronic neck pain   . CKD (chronic kidney disease), stage II   . Diverticulosis of colon   . Elevated troponin    a. 08/2016 in setting of PAF;  b. 08/2016 low risk MV w/ fixed septal defect (LBBB), EF 53%.  Marland Kitchen GERD (gastroesophageal reflux disease)   . Hiatal hernia   . History of aortic valve stenosis   . History of breast cancer per pt no recurrence   dx 2004-- Left breast DCIS (ER/PR+, HER2 negative) s/p  partial masectomy w/ sln bx and  chemoradiotion (completed 2004)  . History of colon polyps   . History of herpes zoster   . History of kidney stones   . History of kidney stones   . HTN (hypertension)   . Hyperlipidemia   . Hypertrophy of inter-atrial septum    a. 09/2014 Echo: ? of atrial mass; b. 10/2014 Cardiac MRI: no atrial septal mass - polypoid lipomatous hypertrophy of superior atrial septum noted-->Benign.  .  Irritable bowel syndrome   . LBBB (left bundle branch block)   . PAF (paroxysmal atrial fibrillation) (HCC)    a. CHA2DS2VASc = 4-->coumadin.  . Parkinsonism [facility])    neurologist-  dr tat  . PONV (postoperative nausea and vomiting)   . PVC's (premature ventricular contractions)   . Right ureteral stone   . S/P aortic valve replacement with prosthetic valve    a. 04/13/2006 s/p St Jude mechnical AVR for severe AS;  b. 09/2014 Echo: EF 40-45%, gr1 DD, mild AI/MR, triv TR/PR.  . SUI (stress urinary incontinence, female)   . Unstable balance    uses cane  . Venous varices      Past Surgical History:  Procedure Laterality Date  . ANTERIOR AND POSTERIOR REPAIR  1990   cystocele/ rectocele  . AORTIC VALVE REPLACEMENT  04/13/06   dr gerhardt   and Replacement Ascending Aorta (St. Jude mechanical prosthesis)  . APPENDECTOMY  child 1950  . BREAST SURGERY Left 2004  . CARDIAC CATHETERIZATION  02-22-2001   dr Martinique   minimal non-obstructive cad/  mild aortic stenosis and aortic root enlargement/  normal LVF, ef 65%  . CARDIAC CATHETERIZATION  03-22-2006    dr Martinique   no significant obstructive cad/  severe aortic stenosis and mild to moderate aortic root enlargement/  upper  normal right heart pressures  . CARDIOVASCULAR STRESS TEST  09-30-2011   dr Martinique   lexiscan nuclear study w/ apical thinning  vs  small prior apical infarct w/ no significant ischemia/  hypokinesis of the distal septum and apex, ef 50%  . CARPAL TUNNEL RELEASE Bilateral left 09-09-2004/  right 2007  . CATARACT EXTRACTION W/ INTRAOCULAR LENS  IMPLANT, BILATERAL  2015  . COLONOSCOPY N/A 10/03/2014   Procedure: COLONOSCOPY;  Surgeon: [name], MD;  Location: [facility];  Service: Endoscopy;  Laterality: N/A;  . CYSTOSCOPY WITH RETROGRADE PYELOGRAM, URETEROSCOPY AND STENT PLACEMENT Right 09/17/2016   Procedure: CYSTOSCOPY WITH RIGHT RETROGRADE PYELOGRAM, RIGHT URETEROSCOPY AND STENT PLACEMENT LASER LITHOTRIPSY, RIGHT  STENT PLACEMENT;  Surgeon: [name], MD;  Location: [facility];  Service: Urology;  Laterality: Right;  . ESOPHAGEAL MANOMETRY N/A 08/12/2014   Procedure: ESOPHAGEAL MANOMETRY (EM);  Surgeon: [name], MD;  Location: [facility];  Service: Endoscopy;  Laterality: N/A;  . ESOPHAGOGASTRODUODENOSCOPY N/A 10/03/2014   Procedure: ESOPHAGOGASTRODUODENOSCOPY (EGD);  Surgeon: [name], MD;  Location: [facility];  Service: Endoscopy;  Laterality: N/A;  . [place]  . FOOT SURGERY Right 2013   hammertoe x2  . HOLMIUM LASER APPLICATION Right 01/23/7866   Procedure: HOLMIUM LASER APPLICATION;  Surgeon: [name], MD;  Location: [facility];  Service: Urology;  Laterality: Right;  . PERCUTANEOUS NEPHROSTOLITHOTOMY  1993  . STONE EXTRACTION WITH BASKET Right 09/17/2016   Procedure: STONE EXTRACTION WITH BASKET;  Surgeon: [name], MD;  Location: [facility];  Service: Urology;  Laterality: Right;  . TOTAL ABDOMINAL HYSTERECTOMY  1971   w/ Bilateral Salpingoophorectomy  . TRANSTHORACIC ECHOCARDIOGRAM  10/24/2014   dr Martinique   hypokinesis of the basal and mid inferoseptal and inferior walls,  ef 40-45%,  grade 1 diastolic dysfunction/  mechanical bileaflet aortic valve noted w/ mild regur., peak grandient 59mHg, valve area 1.35cm^2/  severe MV calcification without stenosis w/ mild regurg., peak gradient 319mg/  mild LAE/  poss. atrial mass (MRI showed benign lipomatous hypertrophy of atrial septum) /tFrazier ButtR/mild TR    There were no vitals filed for this visit.      Subjective Assessment - 01/20/17 0809    Subjective  Pt reports frequent falls (1 injury requiring stitches), but none since she received walker   Pertinent History PSP/Parkinsonism diagnosis 03/2016; hx L breast CA (s/p chemo, radiation, mastectomy,and reconstruction), hx of multiple falls, aortic valve replacement, HTN,  a-fib, GERD (see epic for details).   Limitations fall risk   Patient Stated Goals improve ADLs, writing   Currently in Pain? Yes   Pain Score 3    Pain Location Wrist   Pain Orientation Right   Pain Descriptors / Indicators Sore   Pain Type Acute pain   Pain Onset Today   Pain Frequency Constant   Aggravating Factors  moving it   Pain Relieving Factors unknown/rest   Effect of Pain on Daily Activities OT will monitor and address prn with exercises, stretches, modalities due to new onset           [facility] - 01/20/17 086720    Assessment   Diagnosis Parkinsonism/Progressive Supranuclear Palsy   Referring Provider Dr. ReWells Guilesat   Onset Date --  2017 diagnosis, incr falls in last 1-2 months   Prior Therapy discharged from OT 06/2016, previous ST, PT as well  Precautions   Precautions Fall     Balance Screen   Has the patient fallen in the past 6 months Yes   How many times? --  multiple, none since received walker     Home  Environment   Family/patient expects to be discharged to: Private residence   Lives With Spouse     Prior Function   Level of Independence Independent with basic ADLs;Independent with household mobility with device;Independent with community mobility with device   Vocation Retired   Leisure Enjoys riding the exercise bike daily (2 miles); enjoys reading     ADL   Eating/Feeding --  difficulty cutting food/opening containers, spills,    Grooming Modified independent  difficulty with styling hair/trimming toenails, incr time   Upper Body Bathing Modified independent   Lower Body Bathing Increased time   Upper Body Dressing Increased time   Lower Body Dressing Increased time   Toilet Tranfer --  difficulty sitting   Toileting - Clothing Manipulation Modified independent   Toileting -  Research scientist (medical) Walk in shower     IADL   Prior Level  of Function Shopping independent   Shopping Needs to be accompanied on any shopping trip   Prior Level of Function Light Housekeeping independent prior.  husband now vacuuming, mopping more, pt avoids bending   Prior Level of Function Meal Prep independent previously.  Pt putting things in oven, peeling and chopping more    Prior Level of Function Community Mobility independent (hasn't driven in approx a month due to husband uncomfortable).   Medication Management Is not capable of dispensing or managing own medication   Prior Level of Function Financial Management husband has always performed     Mobility   Mobility Status History of falls;Freezing  intermittent freezing (with turns, small spaces)   Mobility Status Comments freezing improved with PT, now using 4-wheeled walker with seat     Written Expression   Dominant Hand Right   Handwriting Increased time  approx 60% legible, mod micrographia     Vision - History   Baseline Vision Wears glasses all the time  distance   Visual History Macular degeneration  bilaterally   Additional Comments needs large print when reading     Vision Assessment   Tracking/Visual Pursuits --  difficulty with tracking vertically, loses target   Diplopia Assessment --  reports occasional diplopia     Cognition   Overall Cognitive Status Impaired/Different from baseline  pt reports cognition is "about the same"   Area of Impairment Safety/judgement;Awareness   Behaviors Impulsive  at times     Observation/Other Assessments   Standing Functional Reach Test R-11", L-9"   Other Surveys  Select   Physical Performance Test   Yes   Simulated Eating Time (seconds) 15.38   Donning Doffing Jacket Time (seconds) 10.31sec   Donning Doffing Jacket Comments fastening/unfastening 3 buttons in 27.62sec     Coordination   9 Hole Peg Test Right;Left   Right 9 Hole Peg Test 28.47   Left 9 Hole Peg Test 22.56   Box and Blocks R-46-6 (due to bradykinesia)=  40blocks, L-51-3 (due to bradykinesia)= 48blocks     Tone   Assessment Location Right Upper Extremity;Left Upper Extremity     ROM / Strength   AROM / PROM / Strength AROM     AROM   Overall AROM  Within functional limits for tasks performed  RUE Tone   RUE Tone Mild     LUE Tone   LUE Tone Mild                         OT Education - 01/20/17 2015    Education provided Yes   Education Details OT Eval results/POC   Person(s) Educated Patient   Methods Explanation   Comprehension Verbalized understanding          OT Short Term Goals - 01/20/17 2104      OT SHORT TERM GOAL #1   Title Pt will be independent with updated HEP.--check STGs 02/19/17   Time 4   Period Weeks   Status New     OT SHORT TERM GOAL #2   Title Pt will report improved ability to cut food and decr spills when eating with adaptive strategies.   Time 4   Period Weeks   Status New     OT SHORT TERM GOAL #3   Title Pt will improve functional reaching/coordination for ADLs as shown by improving score on box and blocks test by at least 4 with dominant RUE.   Time 4   Period Weeks   Status New     OT SHORT TERM GOAL #4   Title Pt will write at least 2 sentences/grocery list with at least 75% legibility and only min decr in size.   Baseline 60% legibility, mod micrographia   Time 4   Period Weeks   Status New     OT SHORT TERM GOAL #5   Title ---------------           OT Long Term Goals - 01/20/17 2108      OT LONG TERM GOAL #1   Title Pt will verbalize understanding of adaptive strategies/AE for ADLs/IADLs to incr safety/ease/independence (including eating, buttoning, styling hair, writing, and sitting on toilet).--check LTGs 03/19/17   Time 8   Period Weeks   Status New     OT LONG TERM GOAL #2   Title Pt will improve coordination for ADLs as shown by reducing time on 9-hole peg test by at least 4 sec with dominant RUE.   Baseline R-28.47sec   Time 8   Period  Weeks   Status New     OT LONG TERM GOAL #3   Title Pt will improve coordination/functional reaching as shown by improving box and blocks test by at least 8 with dominant RUE.   Baseline R-40, L-48 blocks   Time 8   Period Weeks   Status New     OT LONG TERM GOAL #4   Title Pt will improve functional reaching/balance for IADLs as shown by improving standing functional reach by at least 1" with LUE.   Baseline R-11", L-9"   Time 8   Period Weeks   Status New     OT LONG TERM GOAL #5   Title Pt will write at least 2 sentences/grocery list with at least 90% legibility and only min decr in size.   Baseline approx 60% legibility with mod micrographia   Time 8   Period Weeks   Status New               Plan - 01/20/17 2054    Clinical Impression Statement Pt is a 76 y.o. female with diagnosis of Parkinsonism/PSP (Progressive Supranuclear Palsy).  Pt was diagnosed 03/2016 and received outpatient occupational therapy and made good progress.  She has had incr  falls in last 1-2 months and began physical therapy to obtain walker.  Pt with PMH that includes:  hx L breast CA (s/p chemo, radiation, mastectomy,and reconstruction), hx of multiple falls, aortic valve replacement, HTN, a-fib, GERD (see epic for details).  Pt presents with bradykinesia/hypokinesia, mild rigidity, decr coordination, decr safety/awareness, decr balance/functional mobility for ADLs.  Pt would benefit from occupational thearpy to address these deficits for improved safety/ease with ADLs/IADLs and to prevent future complication related to PSP.   Rehab Potential Good   OT Frequency 2x / week   OT Duration 8 weeks  +eval, may only need 6 weeks    OT Treatment/Interventions Self-care/ADL training;Therapeutic exercise;Cognitive remediation/compensation;Visual/perceptual remediation/compensation;Neuromuscular education;Moist Heat;Parrafin;Ultrasound;Cryotherapy;Fluidtherapy;Therapist, nutritional;Therapeutic  exercises;Patient/family education;Balance training;Therapeutic activities;Manual Therapy;Passive range of motion;DME and/or AE instruction;Energy conservation;Splinting   Plan review PWR! sitting with focus on deliberate sustained movements/timing), safety with ADLs   Recommended Other Services current with PT, ST   Consulted and Agree with Plan of Care Patient      Patient will benefit from skilled therapeutic intervention in order to improve the following deficits and impairments:  Decreased balance, Decreased knowledge of precautions, Decreased safety awareness, Impaired UE functional use, Impaired tone, Improper body mechanics, Decreased mobility, Decreased coordination, Decreased cognition, Decreased knowledge of use of DME, Impaired vision/preception (hypokinesia/bradykinesia)  Visit Diagnosis: Other symptoms and signs involving the nervous system  Other symptoms and signs involving the musculoskeletal system  Other lack of coordination  Other abnormalities of gait and mobility  Unsteadiness on feet  Frontal lobe and executive function deficit      G-Codes - Jan 24, 2017 02-25-2113    Functional Assessment Tool Used 9-hole peg test:  R-28.47sec.  Box and blocks test:  R-40, L-48 blocks.  Standing functional reach test:  L-9", writing with 60% legibility and mod micrographia   Functional Limitation Carrying, moving and handling objects   Carrying, Moving and Handling Objects Current Status [phone]) At least 20 percent but less than 40 percent impaired, limited or restricted   Carrying, Moving and Handling Objects Goal Status ([id number]) At least 1 percent but less than 20 percent impaired, limited or restricted      Problem List Patient Active Problem List   Diagnosis Date Noted  . Elevated troponin 09/19/2016  . Emesis 09/19/2016  . UTI (urinary tract infection) 09/19/2016  . Sinus bradycardia 09/16/2016  . Family history of colon cancer 07/30/2014  . Hiatal hernia 07/30/2014  .  Chest pain 10/13/2011  . Disequilibrium 10/13/2011  . Heart valve replaced by other means 04/16/2011  . Chronic anticoagulation 03/17/2011  . S/P AVR (aortic valve replacement)   . PVC's (premature ventricular contractions)   . HTN (hypertension)   . Hypercholesteremia   . Atrial fibrillation with RVR ([place])   . GERD (gastroesophageal reflux disease)   . GERD 02/06/2010  . DIVERTICULOSIS-COLON 02/06/2010  . PERSONAL HX COLONIC POLYPS 02/06/2010  . MASTECTOMY, HX OF 02/06/2010    [facility] 01/24/17, 9:16 PM  [facility] [address] [place], [place], [address] Phone: [phone]   Fax:  [phone]  Name: XIOMARA SEVILLANO MRN: [id number] Date of Birth: 1941/03/02   Vianne Bulls, OTR/L [facility] [address] [place], [place]  [address] [phone] phone [phone] 24-Jan-2017 9:16 PM

## 2017-01-20 NOTE — Patient Instructions (Addendum)
Sit to Stand Transfers:  1. Scoot out to the edge of the chair 2. Place your feet flat on the floor, shoulder width apart.  Make sure your feet are tucked just under your knees. 3. Lean forward (nose over toes) with momentum, and stand up tall with your best posture.  If you need to use your arms, use them as a quick boost up to stand. 4. If you are in a low or soft chair, you can lean back and then forward up to stand, in order to get more momentum. 5. Once you are standing, make sure you are looking ahead and standing tall.  To sit down:  1. Back up until you feel the chair behind your legs. 2. Bend at you hips, reaching  Back for you chair, if needed, then slowly squat to sit down on your chair.   Tips to reduce freezing episodes with standing or walking:  6. Stand tall with your feet wide, so that you can rock and weight shift through your hips. 7. Don't try to fight the freeze: if you begin taking slower, faster, smaller steps, STOP, get your posture tall, and RESET your posture and balance.  Take a deep breath before taking the BIG step to start again. 8. March in place, with high knee stepping, to get started walking again. 9. Use auditory cues:  Count out loud, think of a familiar tune or song or cadence, use pocket metronome, to use rhythm to get started walking again. 10. Use visual cues:  Use a line to step over, use laser pointer line to step over, (using BIG steps) to start walking again. 11. Use visual targets to keep your posture tall (look ahead and focus on an object or target at eye level). 12. As you approach where your destination with walking, count your steps out loud and/or focus on your target with your eyes until you are fully there. 13. Use appropriate assistive device, as advised by your physical therapist to assist with taking longer, consistent steps.            Fall Prevention in the Home Introduction Falls can cause injuries. They can happen to  people of all ages. There are many things you can do to make your home safe and to help prevent falls. What can I do on the outside of my home?  Regularly fix the edges of walkways and driveways and fix any cracks.  Remove anything that might make you trip as you walk through a door, such as a raised step or threshold.  Trim any bushes or trees on the path to your home.  Use bright outdoor lighting.  Clear any walking paths of anything that might make someone trip, such as rocks or tools.  Regularly check to see if handrails are loose or broken. Make sure that both sides of any steps have handrails.  Any raised decks and porches should have guardrails on the edges.  Have any leaves, snow, or ice cleared regularly.  Use sand or salt on walking paths during winter.  Clean up any spills in your garage right away. This includes oil or grease spills. What can I do in the bathroom?  Use night lights.  Install grab bars by the toilet and in the tub and shower. Do not use towel bars as grab bars.  Use non-skid mats or decals in the tub or shower.  If you need to sit down in the shower, use a plastic, non-slip stool.  Keep the floor dry. Clean up any water that spills on the floor as soon as it happens.  Remove soap buildup in the tub or shower regularly.  Attach bath mats securely with double-sided non-slip rug tape.  Do not have throw rugs and other things on the floor that can make you trip. What can I do in the bedroom?  Use night lights.  Make sure that you have a light by your bed that is easy to reach.  Do not use any sheets or blankets that are too big for your bed. They should not hang down onto the floor.  Have a firm chair that has side arms. You can use this for support while you get dressed.  Do not have throw rugs and other things on the floor that can make you trip. What can I do in the kitchen?  Clean up any spills right away.  Avoid walking on wet  floors.  Keep items that you use a lot in easy-to-reach places.  If you need to reach something above you, use a strong step stool that has a grab bar.  Keep electrical cords out of the way.  Do not use floor polish or wax that makes floors slippery. If you must use wax, use non-skid floor wax.  Do not have throw rugs and other things on the floor that can make you trip. What can I do with my stairs?  Do not leave any items on the stairs.  Make sure that there are handrails on both sides of the stairs and use them. Fix handrails that are broken or loose. Make sure that handrails are as long as the stairways.  Check any carpeting to make sure that it is firmly attached to the stairs. Fix any carpet that is loose or worn.  Avoid having throw rugs at the top or bottom of the stairs. If you do have throw rugs, attach them to the floor with carpet tape.  Make sure that you have a light switch at the top of the stairs and the bottom of the stairs. If you do not have them, ask someone to add them for you. What else can I do to help prevent falls?  Wear shoes that:  Do not have high heels.  Have rubber bottoms.  Are comfortable and fit you well.  Are closed at the toe. Do not wear sandals.  If you use a stepladder:  Make sure that it is fully opened. Do not climb a closed stepladder.  Make sure that both sides of the stepladder are locked into place.  Ask someone to hold it for you, if possible.  Clearly mark and make sure that you can see:  Any grab bars or handrails.  First and last steps.  Where the edge of each step is.  Use tools that help you move around (mobility aids) if they are needed. These include:  Canes.  Walkers.  Scooters.  Crutches.  Turn on the lights when you go into a dark area. Replace any light bulbs as soon as they burn out.  Set up your furniture so you have a clear path. Avoid moving your furniture around.  If any of your floors are  uneven, fix them.  If there are any pets around you, be aware of where they are.  Review your medicines with your doctor. Some medicines can make you feel dizzy. This can increase your chance of falling. Ask your doctor what other things that you can  do to help prevent falls. This information is not intended to replace advice given to you by your health care provider. Make sure you discuss any questions you have with your health care provider. Document Released: 10/09/2009 Document Revised: 05/20/2016 Document Reviewed: 01/17/2015  2017 [employer]

## 2017-01-28 ENCOUNTER — Encounter: Payer: Self-pay | Admitting: Neurology

## 2017-02-03 ENCOUNTER — Ambulatory Visit: Payer: PPO | Attending: Neurology | Admitting: Speech Pathology

## 2017-02-03 ENCOUNTER — Ambulatory Visit: Payer: PPO | Admitting: Occupational Therapy

## 2017-02-03 DIAGNOSIS — R41844 Frontal lobe and executive function deficit: Secondary | ICD-10-CM

## 2017-02-03 DIAGNOSIS — R2681 Unsteadiness on feet: Secondary | ICD-10-CM | POA: Diagnosis not present

## 2017-02-03 DIAGNOSIS — R29898 Other symptoms and signs involving the musculoskeletal system: Secondary | ICD-10-CM | POA: Insufficient documentation

## 2017-02-03 DIAGNOSIS — R471 Dysarthria and anarthria: Secondary | ICD-10-CM | POA: Diagnosis not present

## 2017-02-03 DIAGNOSIS — R293 Abnormal posture: Secondary | ICD-10-CM | POA: Diagnosis not present

## 2017-02-03 DIAGNOSIS — R29818 Other symptoms and signs involving the nervous system: Secondary | ICD-10-CM | POA: Diagnosis not present

## 2017-02-03 DIAGNOSIS — R41842 Visuospatial deficit: Secondary | ICD-10-CM | POA: Insufficient documentation

## 2017-02-03 DIAGNOSIS — R2689 Other abnormalities of gait and mobility: Secondary | ICD-10-CM | POA: Diagnosis not present

## 2017-02-03 DIAGNOSIS — R278 Other lack of coordination: Secondary | ICD-10-CM

## 2017-02-03 NOTE — Therapy (Signed)
[facility] [address] [place], [place], [address] Phone: [phone]   Fax:  [phone]  Speech Language Pathology Treatment  Patient Details  Name: [name] MRN: [id number] Date of Birth: 04-21-1941 Referring Provider: Alonza Bogus DO  Encounter Date: 02/03/2017      End of Session - 02/03/17 0929    Visit Number 2   Number of Visits 17   Date for SLP Re-Evaluation 03/25/17   Activity Tolerance Patient tolerated treatment well      Past Medical History:  Diagnosis Date  . Anticoagulated on Coumadin   . Arthropathy of cervical facet joint    C2-3  . At risk for falls   . BPV (benign positional vertigo)   . Bruises easily   . Cancer ([place]) 2004    breast-lumpectomy,nodes ,radiation,chemo  . Chronic neck pain   . CKD (chronic kidney disease), stage II   . Diverticulosis of colon   . Elevated troponin    a. 08/2016 in setting of PAF;  b. 08/2016 low risk MV w/ fixed septal defect (LBBB), EF 53%.  Marland Kitchen GERD (gastroesophageal reflux disease)   . Hiatal hernia   . History of aortic valve stenosis   . History of breast cancer per pt no recurrence   dx 2004-- Left breast DCIS (ER/PR+, HER2 negative) s/p  partial masectomy w/ sln bx and  chemoradiotion (completed 2004)  . History of colon polyps   . History of herpes zoster   . History of kidney stones   . History of kidney stones   . HTN (hypertension)   . Hyperlipidemia   . Hypertrophy of inter-atrial septum    a. 09/2014 Echo: ? of atrial mass; b. 10/2014 Cardiac MRI: no atrial septal mass - polypoid lipomatous hypertrophy of superior atrial septum noted-->Benign.  . Irritable bowel syndrome   . LBBB (left bundle branch block)   . PAF (paroxysmal atrial fibrillation) (HCC)    a. CHA2DS2VASc = 4-->coumadin.  . Parkinsonism [facility])    neurologist-  dr tat  . PONV (postoperative nausea and vomiting)   . PVC's (premature ventricular contractions)   . Right  ureteral stone   . S/P aortic valve replacement with prosthetic valve    a. 04/13/2006 s/p St Jude mechnical AVR for severe AS;  b. 09/2014 Echo: EF 40-45%, gr1 DD, mild AI/MR, triv TR/PR.  . SUI (stress urinary incontinence, female)   . Unstable balance    uses cane  . Venous varices      Past Surgical History:  Procedure Laterality Date  . ANTERIOR AND POSTERIOR REPAIR  1990   cystocele/ rectocele  . AORTIC VALVE REPLACEMENT  04/13/06   dr gerhardt   and Replacement Ascending Aorta (St. Jude mechanical prosthesis)  . APPENDECTOMY  child 1950  . BREAST SURGERY Left 2004  . CARDIAC CATHETERIZATION  02-22-2001   dr Martinique   minimal non-obstructive cad/  mild aortic stenosis and aortic root enlargement/  normal LVF, ef 65%  . CARDIAC CATHETERIZATION  03-22-2006    dr Martinique   no significant obstructive cad/  severe aortic stenosis and mild to moderate aortic root enlargement/  upper normal right heart pressures  . CARDIOVASCULAR STRESS TEST  09-30-2011   dr Martinique   lexiscan nuclear study w/ apical thinning  vs  small prior apical infarct w/ no significant ischemia/  hypokinesis of the distal septum and apex, ef 50%  . CARPAL TUNNEL RELEASE Bilateral left 09-09-2004/  right 2007  .  CATARACT EXTRACTION W/ INTRAOCULAR LENS  IMPLANT, BILATERAL  2015  . COLONOSCOPY N/A 10/03/2014   Procedure: COLONOSCOPY;  Surgeon: [name], MD;  Location: [facility];  Service: Endoscopy;  Laterality: N/A;  . CYSTOSCOPY WITH RETROGRADE PYELOGRAM, URETEROSCOPY AND STENT PLACEMENT Right 09/17/2016   Procedure: CYSTOSCOPY WITH RIGHT RETROGRADE PYELOGRAM, RIGHT URETEROSCOPY AND STENT PLACEMENT LASER LITHOTRIPSY, RIGHT STENT PLACEMENT;  Surgeon: [name], MD;  Location: [facility];  Service: Urology;  Laterality: Right;  . ESOPHAGEAL MANOMETRY N/A 08/12/2014   Procedure: ESOPHAGEAL MANOMETRY (EM);  Surgeon: [name], MD;  Location: [facility];  Service: Endoscopy;  Laterality:  N/A;  . ESOPHAGOGASTRODUODENOSCOPY N/A 10/03/2014   Procedure: ESOPHAGOGASTRODUODENOSCOPY (EGD);  Surgeon: [name], MD;  Location: [facility];  Service: Endoscopy;  Laterality: N/A;  . [place]  . FOOT SURGERY Right 2013   hammertoe x2  . HOLMIUM LASER APPLICATION Right 06/17/6332   Procedure: HOLMIUM LASER APPLICATION;  Surgeon: [name], MD;  Location: [facility];  Service: Urology;  Laterality: Right;  . PERCUTANEOUS NEPHROSTOLITHOTOMY  1993  . STONE EXTRACTION WITH BASKET Right 09/17/2016   Procedure: STONE EXTRACTION WITH BASKET;  Surgeon: [name], MD;  Location[facility];  Service: Urology;  Laterality: Right;  . TOTAL ABDOMINAL HYSTERECTOMY  1971   w/ Bilateral Salpingoophorectomy  . TRANSTHORACIC ECHOCARDIOGRAM  10/24/2014   dr Martinique   hypokinesis of the basal and mid inferoseptal and inferior walls,  ef 40-45%,  grade 1 diastolic dysfunction/  mechanical bileaflet aortic valve noted w/ mild regur., peak grandient 12mHg, valve area 1.35cm^2/  severe MV calcification without stenosis w/ mild regurg., peak gradient 352mg/  mild LAE/  poss. atrial mass (MRI showed benign lipomatous hypertrophy of atrial septum) /tFrazier ButtR/mild TR    There were no vitals filed for this visit.      Subjective Assessment - 02/03/17 0850    Subjective "He told me to take a deep breath before I talk to my husband." Re: communication at home   Currently in Pain? No/denies               ADULT SLP TREATMENT - 02/03/17 0001      General Information   Behavior/Cognition Alert;Cooperative     Treatment Provided   Treatment provided Cognitive-Linquistic     Pain Assessment   Pain Assessment No/denies pain     Cognitive-Linquistic Treatment   Treatment focused on Dysarthria;Voice   Skilled Treatment SLPused loud /a/ to recalibrate patient's loudness in conversation. Used pt's perceived effort  level of 8 to improve carryover of vocal techniques to hierarchical tasks at phrase and sentence level with increased cognitive load, as well as during conversation. Patient required min verbal and visual cues to increase loudness and benefitted from clinician demonstration and modelling. Homework provided. Sustained /a/ averaged 87.1 dB with min A cues. Phrase level: 73.5 dB, 74.3 dB avg at sentence level min verbal and gestural cues. Spontaneous responses averaged 66.1 dB; with verbal and gestural cues for increased effort, patient improved to 71.8 dB in structured conversational tasks.     Assessment / Recommendations / Plan   Plan Continue with current plan of care     Progression Toward Goals   Progression toward goals Progressing toward goals          SLP Education - 02/03/17 0929    Education provided Yes   Education Details posture for improved breath support, vocal  loudness/effort   Person(s) Educated Patient   Methods Explanation;Demonstration;Verbal cues   Comprehension Verbalized understanding;Returned demonstration          SLP Short Term Goals - 02/03/17 0932      SLP SHORT TERM GOAL #1   Title pt will produce loud /a/ with proper voicing at average low 90's dB over three sessions   Time 4   Period Weeks   Status On-going     SLP SHORT TERM GOAL #2   Title pt will converse for 10 minutes re: simple topic with appropriate breath support for average volume in low 70s dB.   Time 4   Period Weeks   Status On-going     SLP SHORT TERM GOAL #3   Title pt will exhibit no greater than 2 episodes of rushing of speech in 10 minutes simple conversation over three sessions   Time 4   Period Weeks   Status On-going          SLP Long Term Goals - 02/03/17 7858      SLP LONG TERM GOAL #1   Title pt will demo no more than one episode of rushing of speech in 15 minutes mod complex conversation over two sessions   Time 8   Period Weeks   Status On-going     SLP LONG  TERM GOAL #2   Title pt will achieve 15 minutes mod complex conversation with appropriate breath support to allow no > 1 instance of speaking on residual air   Time 8   Period Weeks   Status On-going     SLP LONG TERM GOAL #3   Title pt will sustain /a/ at average low 90s dB over 6 sessions   Time 8   Period Weeks   Status On-going          Plan - 02/03/17 8502    Clinical Impression Statement Pt presents today with mild dysarthria and reduced overall intelligibility due to reduced conversational volume, short rushes of speech. Patient reports she has been completing home exercises and has been attempting to take deeper breaths prior to speaking with her husband. She requires moderate cues for breath support to increase volume during conversation. She will benefit from continued skilled ST to address communication deficits and to improve her communication at home and in the community.   Speech Therapy Frequency 2x / week   Duration Other (comment)   Treatment/Interventions Compensatory strategies;Functional tasks;Patient/family education;Cueing hierarchy;Internal/external aids;SLP instruction and feedback      Patient will benefit from skilled therapeutic intervention in order to improve the following deficits and impairments:   Dysarthria and anarthria    Problem List Patient Active Problem List   Diagnosis Date Noted  . Elevated troponin 09/19/2016  . Emesis 09/19/2016  . UTI (urinary tract infection) 09/19/2016  . Sinus bradycardia 09/16/2016  . Family history of colon cancer 07/30/2014  . Hiatal hernia 07/30/2014  . Chest pain 10/13/2011  . Disequilibrium 10/13/2011  . Heart valve replaced by other means 04/16/2011  . Chronic anticoagulation 03/17/2011  . S/P AVR (aortic valve replacement)   . PVC's (premature ventricular contractions)   . HTN (hypertension)   . Hypercholesteremia   . Atrial fibrillation with RVR ([place])   . GERD (gastroesophageal reflux disease)   .  GERD 02/06/2010  . DIVERTICULOSIS-COLON 02/06/2010  . PERSONAL HX COLONIC POLYPS 02/06/2010  . MASTECTOMY, HX OF 02/06/2010   Deneise Lever, MS CF-SLP Speech-Language Pathologist   Aliene Altes 02/03/2017,  9:34 AM  [facility] [address] [place], [place], [address] Phone: [phone]   Fax:  [phone]   Name: [name] MRN: [id number] Date of Birth: Jul 25, 1941

## 2017-02-03 NOTE — Patient Instructions (Signed)
Homework assigned

## 2017-02-03 NOTE — Therapy (Signed)
[facility] [address] [place], [place], [address] Phone: [phone]   Fax:  [phone]  Occupational Therapy Treatment  Patient Details  Name: [name] MRN: [id number] Date of Birth: 12/05/1941 Referring Provider: Dr. Rebecca Tat  Encounter Date: 02/03/2017      OT End of Session - 02/03/17 0808    Visit Number 2   Number of Visits 17   Date for OT Re-Evaluation 03/19/17   Authorization Type [employer], no visit limit/no auth, G-code needed   Authorization - Visit Number 2   Authorization - Number of Visits 10   OT Start Time 0804   OT Stop Time 0845   OT Time Calculation (min) 41 min   Activity Tolerance Patient tolerated treatment well   Behavior During Therapy WFL for tasks assessed/performed      Past Medical History:  Diagnosis Date  . Anticoagulated on Coumadin   . Arthropathy of cervical facet joint    C2-3  . At risk for falls   . BPV (benign positional vertigo)   . Bruises easily   . Cancer (H[facility]) 2004    breast-lumpectomy,nodes ,radiation,chemo  . Chronic neck pain   . CKD (chronic kidney disease), stage II   . Diverticulosis of colon   . Elevated troponin    a. 08/2016 in setting of PAF;  b. 08/2016 low risk MV w/ fixed septal defect (LBBB), EF 53%.  . GERD (gastroesophageal reflux disease)   . Hiatal hernia   . History of aortic valve stenosis   . History of breast cancer per pt no recurrence   dx 2004-- Left breast DCIS (ER/PR+, HER2 negative) s/p  partial masectomy w/ sln bx and  chemoradiotion (completed 2004)  . History of colon polyps   . History of herpes zoster   . History of kidney stones   . History of kidney stones   . HTN (hypertension)   . Hyperlipidemia   . Hypertrophy of inter-atrial septum    a. 09/2014 Echo: ? of atrial mass; b. 10/2014 Cardiac MRI: no atrial septal mass - polypoid lipomatous hypertrophy of superior atrial septum noted-->Benign.  .  Irritable bowel syndrome   . LBBB (left bundle branch block)   . PAF (paroxysmal atrial fibrillation) (HCC)    a. CHA2DS2VASc = 4-->coumadin.  . Parkinsonism (HCC)    neurologist-  dr tat  . PONV (postoperative nausea and vomiting)   . PVC's (premature ventricular contractions)   . Right ureteral stone   . S/P aortic valve replacement with prosthetic valve    a. 04/13/2006 s/p St Jude mechnical AVR for severe AS;  b. 09/2014 Echo: EF 40-45%, gr1 DD, mild AI/MR, triv TR/PR.  . SUI (stress urinary incontinence, female)   . Unstable balance    uses cane  . Venous varices      Past Surgical History:  Procedure Laterality Date  . ANTERIOR AND POSTERIOR REPAIR  1990   cystocele/ rectocele  . AORTIC VALVE REPLACEMENT  04/13/06   dr gerhardt   and Replacement Ascending Aorta (St. Jude mechanical prosthesis)  . APPENDECTOMY  child 1950  . BREAST SURGERY Left 2004  . CARDIAC CATHETERIZATION  02-22-2001   dr jordan   minimal non-obstructive cad/  mild aortic stenosis and aortic root enlargement/  normal LVF, ef 65%  . CARDIAC CATHETERIZATION  03-22-2006    dr jordan   no significant obstructive cad/  severe aortic stenosis and mild to moderate aortic root enlargement/  upper   normal right heart pressures  . CARDIOVASCULAR STRESS TEST  09-30-2011   dr jordan   lexiscan nuclear study w/ apical thinning  vs  small prior apical infarct w/ no significant ischemia/  hypokinesis of the distal septum and apex, ef 50%  . CARPAL TUNNEL RELEASE Bilateral left 09-09-2004/  right 2007  . CATARACT EXTRACTION W/ INTRAOCULAR LENS  IMPLANT, BILATERAL  2015  . COLONOSCOPY N/A 10/03/2014   Procedure: COLONOSCOPY;  Surgeon: Carl E Gessner, MD;  Location: [facility];  Service: Endoscopy;  Laterality: N/A;  . CYSTOSCOPY WITH RETROGRADE PYELOGRAM, URETEROSCOPY AND STENT PLACEMENT Right 09/17/2016   Procedure: CYSTOSCOPY WITH RIGHT RETROGRADE PYELOGRAM, RIGHT URETEROSCOPY AND STENT PLACEMENT LASER LITHOTRIPSY, RIGHT  STENT PLACEMENT;  Surgeon: Benjamin W Herrick, MD;  Location: [facility];  Service: Urology;  Laterality: Right;  . ESOPHAGEAL MANOMETRY N/A 08/12/2014   Procedure: ESOPHAGEAL MANOMETRY (EM);  Surgeon: Carl E Gessner, MD;  Location: [facility];  Service: Endoscopy;  Laterality: N/A;  . ESOPHAGOGASTRODUODENOSCOPY N/A 10/03/2014   Procedure: ESOPHAGOGASTRODUODENOSCOPY (EGD);  Surgeon: Carl E Gessner, MD;  Location: [facility];  Service: Endoscopy;  Laterality: N/A;  . EXTRACORPOREAL SHOCK WAVE LITHOTRIPSY  1997  . FOOT SURGERY Right 2013   hammertoe x2  . HOLMIUM LASER APPLICATION Right 09/17/2016   Procedure: HOLMIUM LASER APPLICATION;  Surgeon: Benjamin W Herrick, MD;  Location: [facility];  Service: Urology;  Laterality: Right;  . PERCUTANEOUS NEPHROSTOLITHOTOMY  1993  . STONE EXTRACTION WITH BASKET Right 09/17/2016   Procedure: STONE EXTRACTION WITH BASKET;  Surgeon: Benjamin W Herrick, MD;  Location: [facility];  Service: Urology;  Laterality: Right;  . TOTAL ABDOMINAL HYSTERECTOMY  1971   w/ Bilateral Salpingoophorectomy  . TRANSTHORACIC ECHOCARDIOGRAM  10/24/2014   dr jordan   hypokinesis of the basal and mid inferoseptal and inferior walls,  ef 40-45%,  grade 1 diastolic dysfunction/  mechanical bileaflet aortic valve noted w/ mild regur., peak grandient 25mmHg, valve area 1.35cm^2/  severe MV calcification without stenosis w/ mild regurg., peak gradient 3mmHg/  mild LAE/  poss. atrial mass (MRI showed benign lipomatous hypertrophy of atrial septum) /trivial PR/mild TR    There were no vitals filed for this visit.      Subjective Assessment - 02/03/17 0808    Subjective  No new falls   Pertinent History PSP/Parkinsonism diagnosis 03/2016; hx L breast CA (s/p chemo, radiation, mastectomy,and reconstruction), hx of multiple falls, aortic valve replacement, HTN, a-fib, GERD (see epic for details).   Limitations fall risk   Patient Stated  Goals improve ADLs, writing   Currently in Pain? No/denies   Pain Onset Today         Neuro re-ed:  PWR! Moves (basic 4) in sitting x 10-20 each with min cues For incr movement amplitude, timing and deliberate movements  PWR! Moves (up, rock, twist) in supine x 10-20 each with min cues For incr movement amplitude, timing and deliberate movements.  Supine>sitting with large amplitude, deliberate movements and strategy.  Pt with dizziness reported with head/eye movements after approx 7reps of twist and rock.  Pt stopped until it settled and then able to resume.  Arm bike x5min level 1 for reciprocal movement with cues/target of at least 40rpms for intensity while maintaining movement amplitude/reciprocal movement.   Pt maintained 39-43rpms without rest.     Practiced writing name/address.  Approx 80% legibility with mod decr in size.  Then practiced continuous "l" with min difficulty, continuous "a" with significant decr   in size.  Then, circles with focus on size and using lines as a guide.  Pt performed all with built-up grip, use of PWR! Hands prn, stop-restart strategy, and cueing for size/speed.  Pt demo improvement initially, but then size decr the more she wrote, particularly with frustration.                             OT Short Term Goals - 01/20/17 2104      OT SHORT TERM GOAL #1   Title Pt will be independent with updated HEP.--check STGs 02/19/17   Time 4   Period Weeks   Status New     OT SHORT TERM GOAL #2   Title Pt will report improved ability to cut food and decr spills when eating with adaptive strategies.   Time 4   Period Weeks   Status New     OT SHORT TERM GOAL #3   Title Pt will improve functional reaching/coordination for ADLs as shown by improving score on box and blocks test by at least 4 with dominant RUE.   Time 4   Period Weeks   Status New     OT SHORT TERM GOAL #4   Title Pt will write at least 2 sentences/grocery list with  at least 75% legibility and only min decr in size.   Baseline 60% legibility, mod micrographia   Time 4   Period Weeks   Status New     OT SHORT TERM GOAL #5   Title ---------------           OT Long Term Goals - 01/20/17 2108      OT LONG TERM GOAL #1   Title Pt will verbalize understanding of adaptive strategies/AE for ADLs/IADLs to incr safety/ease/independence (including eating, buttoning, styling hair, writing, and sitting on toilet).--check LTGs 03/19/17   Time 8   Period Weeks   Status New     OT LONG TERM GOAL #2   Title Pt will improve coordination for ADLs as shown by reducing time on 9-hole peg test by at least 4 sec with dominant RUE.   Baseline R-28.47sec   Time 8   Period Weeks   Status New     OT LONG TERM GOAL #3   Title Pt will improve coordination/functional reaching as shown by improving box and blocks test by at least 8 with dominant RUE.   Baseline R-40, L-48 blocks   Time 8   Period Weeks   Status New     OT LONG TERM GOAL #4   Title Pt will improve functional reaching/balance for IADLs as shown by improving standing functional reach by at least 1" with LUE.   Baseline R-11", L-9"   Time 8   Period Weeks   Status New     OT LONG TERM GOAL #5   Title Pt will write at least 2 sentences/grocery list with at least 90% legibility and only min decr in size.   Baseline approx 60% legibility with mod micrographia   Time 8   Period Weeks   Status New               Plan - 02/03/17 8937    Clinical Impression Statement Pt demo good understanding of PWR! seated HEP, but needed occasional cueing for timing.  Pt inconsistent with writing performance and will benefit from further reinforcement of strategies.   Rehab Potential Good   OT Frequency 2x /  week   OT Duration 8 weeks  +eval, may only need 6 weeks    OT Treatment/Interventions Self-care/ADL training;Therapeutic exercise;Cognitive remediation/compensation;Visual/perceptual  remediation/compensation;Neuromuscular education;Moist Heat;Parrafin;Ultrasound;Cryotherapy;Fluidtherapy;Functional Mobility Training;Therapeutic exercises;Patient/family education;Balance training;Therapeutic activities;Manual Therapy;Passive range of motion;DME and/or AE instruction;Energy conservation;Splinting   Plan safety with ADLs, continue with handwriting, coordination   OT Home Exercise Plan Education provided:  reviewed PWR! sitting (basic 4)   Consulted and Agree with Plan of Care Patient      Patient will benefit from skilled therapeutic intervention in order to improve the following deficits and impairments:  Decreased balance, Decreased knowledge of precautions, Decreased safety awareness, Impaired UE functional use, Impaired tone, Improper body mechanics, Decreased mobility, Decreased coordination, Decreased cognition, Decreased knowledge of use of DME, Impaired vision/preception (hypokinesia/bradykinesia)  Visit Diagnosis: Other symptoms and signs involving the nervous system  Other symptoms and signs involving the musculoskeletal system  Other lack of coordination  Frontal lobe and executive function deficit  Unsteadiness on feet  Other abnormalities of gait and mobility  Abnormal posture  Visuospatial deficit    Problem List Patient Active Problem List   Diagnosis Date Noted  . Elevated troponin 09/19/2016  . Emesis 09/19/2016  . UTI (urinary tract infection) 09/19/2016  . Sinus bradycardia 09/16/2016  . Family history of colon cancer 07/30/2014  . Hiatal hernia 07/30/2014  . Chest pain 10/13/2011  . Disequilibrium 10/13/2011  . Heart valve replaced by other means 04/16/2011  . Chronic anticoagulation 03/17/2011  . S/P AVR (aortic valve replacement)   . PVC's (premature ventricular contractions)   . HTN (hypertension)   . Hypercholesteremia   . Atrial fibrillation with RVR ([facility])   . GERD (gastroesophageal reflux disease)   . GERD 02/06/2010  .  DIVERTICULOSIS-COLON 02/06/2010  . PERSONAL HX COLONIC POLYPS 02/06/2010  . MASTECTOMY, HX OF 02/06/2010    FREEMAN,ANGELA 02/03/2017, 11:22 AM  [facility] [address] [place], [place], [address] Phone: [phone]   Fax:  [phone]  Name: [name] MRN: [id number] Date of Birth: 07/27/1941   Angela Freeman, OTR/L [facility] [address] [place], [place]  [address] [phone] phone [phone] 02/03/17 11:22 AM    

## 2017-02-08 DIAGNOSIS — I358 Other nonrheumatic aortic valve disorders: Secondary | ICD-10-CM | POA: Diagnosis not present

## 2017-02-08 DIAGNOSIS — Z7901 Long term (current) use of anticoagulants: Secondary | ICD-10-CM | POA: Diagnosis not present

## 2017-02-10 ENCOUNTER — Ambulatory Visit: Payer: PPO | Admitting: Occupational Therapy

## 2017-02-10 ENCOUNTER — Ambulatory Visit: Payer: PPO | Admitting: Speech Pathology

## 2017-02-10 DIAGNOSIS — R29898 Other symptoms and signs involving the musculoskeletal system: Secondary | ICD-10-CM

## 2017-02-10 DIAGNOSIS — R471 Dysarthria and anarthria: Secondary | ICD-10-CM

## 2017-02-10 DIAGNOSIS — R41844 Frontal lobe and executive function deficit: Secondary | ICD-10-CM

## 2017-02-10 DIAGNOSIS — R278 Other lack of coordination: Secondary | ICD-10-CM

## 2017-02-10 DIAGNOSIS — R29818 Other symptoms and signs involving the nervous system: Secondary | ICD-10-CM

## 2017-02-10 NOTE — Patient Instructions (Signed)
  Continue loud "AH" 5x twice a day with big breath each time  Practice abdominal breathing keeping your shoulders and chest still and watching you belly move out when you breath in  Do sentences loudly

## 2017-02-10 NOTE — Therapy (Signed)
[facility] [address] [place], [place], [address] Phone: [phone]   Fax:  [phone]  Speech Language Pathology Treatment  Patient Details  Name: [name] MRN: [id number] Date of Birth: [birth year] Referring Provider: Alonza Bogus DO  Encounter Date: 02/10/2017      End of Session - 02/10/17 1019    Visit Number 3   Number of Visits 17   Date for SLP Re-Evaluation 03/25/17   SLP Start Time 0930   SLP Stop Time  1012   SLP Time Calculation (min) 42 min   Activity Tolerance Patient tolerated treatment well      Past Medical History:  Diagnosis Date  . Anticoagulated on Coumadin   . Arthropathy of cervical facet joint    C2-3  . At risk for falls   . BPV (benign positional vertigo)   . Bruises easily   . Cancer ([place]) 2004    breast-lumpectomy,nodes ,radiation,chemo  . Chronic neck pain   . CKD (chronic kidney disease), stage II   . Diverticulosis of colon   . Elevated troponin    a. 08/2016 in setting of PAF;  b. 08/2016 low risk MV w/ fixed septal defect (LBBB), EF 53%.  Marland Kitchen GERD (gastroesophageal reflux disease)   . Hiatal hernia   . History of aortic valve stenosis   . History of breast cancer per pt no recurrence   dx 2004-- Left breast DCIS (ER/PR+, HER2 negative) s/p  partial masectomy w/ sln bx and  chemoradiotion (completed 2004)  . History of colon polyps   . History of herpes zoster   . History of kidney stones   . History of kidney stones   . HTN (hypertension)   . Hyperlipidemia   . Hypertrophy of inter-atrial septum    a. 09/2014 Echo: ? of atrial mass; b. 10/2014 Cardiac MRI: no atrial septal mass - polypoid lipomatous hypertrophy of superior atrial septum noted-->Benign.  . Irritable bowel syndrome   . LBBB (left bundle branch block)   . PAF (paroxysmal atrial fibrillation) (HCC)    a. CHA2DS2VASc = 4-->coumadin.  . Parkinsonism [facility])    neurologist-  dr tat  . PONV  (postoperative nausea and vomiting)   . PVC's (premature ventricular contractions)   . Right ureteral stone   . S/P aortic valve replacement with prosthetic valve    a. 04/13/2006 s/p St Jude mechnical AVR for severe AS;  b. 09/2014 Echo: EF 40-45%, gr1 DD, mild AI/MR, triv TR/PR.  . SUI (stress urinary incontinence, female)   . Unstable balance    uses cane  . Venous varices      Past Surgical History:  Procedure Laterality Date  . ANTERIOR AND POSTERIOR REPAIR  1990   cystocele/ rectocele  . AORTIC VALVE REPLACEMENT  04/13/06   dr gerhardt   and Replacement Ascending Aorta (St. Jude mechanical prosthesis)  . APPENDECTOMY  child 1950  . BREAST SURGERY Left 2004  . CARDIAC CATHETERIZATION  02-22-2001   dr Martinique   minimal non-obstructive cad/  mild aortic stenosis and aortic root enlargement/  normal LVF, ef 65%  . CARDIAC CATHETERIZATION  03-22-2006    dr Martinique   no significant obstructive cad/  severe aortic stenosis and mild to moderate aortic root enlargement/  upper normal right heart pressures  . CARDIOVASCULAR STRESS TEST  09-30-2011   dr Martinique   lexiscan nuclear study w/ apical thinning  vs  small prior apical infarct w/ no significant ischemia/  hypokinesis of the distal septum and apex, ef 50%  . CARPAL TUNNEL RELEASE Bilateral left 09-09-2004/  right 2007  . CATARACT EXTRACTION W/ INTRAOCULAR LENS  IMPLANT, BILATERAL  2015  . COLONOSCOPY N/A 10/03/2014   Procedure: COLONOSCOPY;  Surgeon: [name], MD;  Location: [facility];  Service: Endoscopy;  Laterality: N/A;  . CYSTOSCOPY WITH RETROGRADE PYELOGRAM, URETEROSCOPY AND STENT PLACEMENT Right 09/17/2016   Procedure: CYSTOSCOPY WITH RIGHT RETROGRADE PYELOGRAM, RIGHT URETEROSCOPY AND STENT PLACEMENT LASER LITHOTRIPSY, RIGHT STENT PLACEMENT;  Surgeon: [name], MD;  Location: [facility];  Service: Urology;  Laterality: Right;  . ESOPHAGEAL MANOMETRY N/A 08/12/2014   Procedure: ESOPHAGEAL MANOMETRY  (EM);  Surgeon: [name], MD;  Location: [facility];  Service: Endoscopy;  Laterality: N/A;  . ESOPHAGOGASTRODUODENOSCOPY N/A 10/03/2014   Procedure: ESOPHAGOGASTRODUODENOSCOPY (EGD);  Surgeon: [name], MD;  Location: [facility] ENDOSCOPY;  Service: Endoscopy;  Laterality: N/A;  . [place]  . FOOT SURGERY Right 2013   hammertoe x2  . HOLMIUM LASER APPLICATION Right 4/70/9628   Procedure: HOLMIUM LASER APPLICATION;  Surgeon: [name], MD;  Location: [facility];  Service: Urology;  Laterality: Right;  . PERCUTANEOUS NEPHROSTOLITHOTOMY  1993  . STONE EXTRACTION WITH BASKET Right 09/17/2016   Procedure: STONE EXTRACTION WITH BASKET;  Surgeon: [name], MD;  Location[facility];  Service: Urology;  Laterality: Right;  . TOTAL ABDOMINAL HYSTERECTOMY  1971   w/ Bilateral Salpingoophorectomy  . TRANSTHORACIC ECHOCARDIOGRAM  10/24/2014   dr Martinique   hypokinesis of the basal and mid inferoseptal and inferior walls,  ef 40-45%,  grade 1 diastolic dysfunction/  mechanical bileaflet aortic valve noted w/ mild regur., peak grandient 3mHg, valve area 1.35cm^2/  severe MV calcification without stenosis w/ mild regurg., peak gradient 332mg/  mild LAE/  poss. atrial mass (MRI showed benign lipomatous hypertrophy of atrial septum) /tFrazier ButtR/mild TR    There were no vitals filed for this visit.      Subjective Assessment - 02/10/17 0937    Subjective "I had to read sentences"               ADULT SLP TREATMENT - 02/10/17 0937      General Information   Behavior/Cognition Alert;Cooperative     Treatment Provided   Treatment provided Cognitive-Linquistic     Pain Assessment   Pain Assessment No/denies pain     Cognitive-Linquistic Treatment   Treatment focused on Dysarthria;Voice   Skilled Treatment Volume recalibrated with loud /a/ average of 90dB. Structured speech tasks with oral reading 4   words and telling which does not belong and why  with average of 72 dB and occasional min cues to utilize breath support to maintain volume. Generate multiple meaning sentences with ongoing cues for breath support. Simple conversation average 68dB with cues to breathe.     Assessment / Recommendations / Plan   Plan Continue with current plan of care     Progression Toward Goals   Progression toward goals Progressing toward goals          SLP Education - 02/10/17 1013    Education provided Yes   Education Details abdominal breathing to power volume, effor to project    Person(s) Educated Patient   Methods Explanation;Demonstration;Verbal cues   Comprehension Verbalized understanding;Returned demonstration;Verbal cues required          SLP Short Term Goals - 02/10/17 1018  SLP SHORT TERM GOAL #1   Title pt will produce loud /a/ with proper voicing at average low 90's dB over three sessions   Time 3   Period Weeks   Status On-going     SLP SHORT TERM GOAL #2   Title pt will converse for 10 minutes re: simple topic with appropriate breath support for average volume in low 70s dB.   Time 3   Period Weeks   Status On-going     SLP SHORT TERM GOAL #3   Title pt will exhibit no greater than 2 episodes of rushing of speech in 10 minutes simple conversation over three sessions   Time 3   Period Weeks   Status On-going          SLP Long Term Goals - 02/10/17 1018      SLP LONG TERM GOAL #1   Title pt will demo no more than one episode of rushing of speech in 15 minutes mod complex conversation over two sessions   Time 7   Period Weeks   Status On-going     SLP LONG TERM GOAL #2   Title pt will achieve 15 minutes mod complex conversation with appropriate breath support to allow no > 1 instance of speaking on residual air   Time 7   Period Weeks   Status On-going     SLP LONG TERM GOAL #3   Title pt will sustain /a/ at average low 90s dB over 6 sessions   Time 7    Period Weeks   Status On-going          Plan - 02/10/17 1016    Clinical Impression Statement Pt continues to require ongoing cueing for breath support/abdmonimal breathing to maintain volume during conversation. Short rushes of speech with some glottal fry continue. Continue skilled ST to maximize intelligiblity.    Speech Therapy Frequency 2x / week   Treatment/Interventions Compensatory strategies;Functional tasks;Patient/family education;Cueing hierarchy;Internal/external aids;SLP instruction and feedback   Potential to Achieve Goals Good      Patient will benefit from skilled therapeutic intervention in order to improve the following deficits and impairments:   Dysarthria and anarthria    Problem List Patient Active Problem List   Diagnosis Date Noted  . Elevated troponin 09/19/2016  . Emesis 09/19/2016  . UTI (urinary tract infection) 09/19/2016  . Sinus bradycardia 09/16/2016  . Family history of colon cancer 07/30/2014  . Hiatal hernia 07/30/2014  . Chest pain 10/13/2011  . Disequilibrium 10/13/2011  . Heart valve replaced by other means 04/16/2011  . Chronic anticoagulation 03/17/2011  . S/P AVR (aortic valve replacement)   . PVC's (premature ventricular contractions)   . HTN (hypertension)   . Hypercholesteremia   . Atrial fibrillation with RVR ([place])   . GERD (gastroesophageal reflux disease)   . GERD 02/06/2010  . DIVERTICULOSIS-COLON 02/06/2010  . PERSONAL HX COLONIC POLYPS 02/06/2010  . MASTECTOMY, HX OF 02/06/2010    Phu Record, Annye Rusk MS, CCC-SLP 02/10/2017, 10:19 AM  [facility] [address] [place], [place], [address] Phone: [phone]   Fax:  [phone]   Name: [name] MRN: [id number] Date of Birth: 16-Apr-1941

## 2017-02-10 NOTE — Therapy (Signed)
[facility] [address] [place], [place], [address] Phone: [phone]   Fax:  [phone]  Occupational Therapy Treatment  Patient Details  Name: [name] MRN: [id number] Date of Birth: May 22, 1941 Referring Provider: Dr. Wells Guiles Tat  Encounter Date: 02/10/2017      OT End of Session - 02/10/17 1342    Visit Number 3   Number of Visits 17   Date for OT Re-Evaluation 03/19/17   Authorization Type [employer], no visit limit/no auth, G-code needed   Authorization - Visit Number 3   Authorization - Number of Visits 10   OT Start Time [phone]   OT Stop Time 0932   OT Time Calculation (min) 40 min   Activity Tolerance Patient tolerated treatment well   Behavior During Therapy [facility] for tasks assessed/performed      Past Medical History:  Diagnosis Date  . Anticoagulated on Coumadin   . Arthropathy of cervical facet joint    C2-3  . At risk for falls   . BPV (benign positional vertigo)   . Bruises easily   . Cancer ([place]) 2004    breast-lumpectomy,nodes ,radiation,chemo  . Chronic neck pain   . CKD (chronic kidney disease), stage II   . Diverticulosis of colon   . Elevated troponin    a. 08/2016 in setting of PAF;  b. 08/2016 low risk MV w/ fixed septal defect (LBBB), EF 53%.  Marland Kitchen GERD (gastroesophageal reflux disease)   . Hiatal hernia   . History of aortic valve stenosis   . History of breast cancer per pt no recurrence   dx 2004-- Left breast DCIS (ER/PR+, HER2 negative) s/p  partial masectomy w/ sln bx and  chemoradiotion (completed 2004)  . History of colon polyps   . History of herpes zoster   . History of kidney stones   . History of kidney stones   . HTN (hypertension)   . Hyperlipidemia   . Hypertrophy of inter-atrial septum    a. 09/2014 Echo: ? of atrial mass; b. 10/2014 Cardiac MRI: no atrial septal mass - polypoid lipomatous hypertrophy of superior atrial septum noted-->Benign.  .  Irritable bowel syndrome   . LBBB (left bundle branch block)   . PAF (paroxysmal atrial fibrillation) (HCC)    a. CHA2DS2VASc = 4-->coumadin.  . Parkinsonism [facility])    neurologist-  dr tat  . PONV (postoperative nausea and vomiting)   . PVC's (premature ventricular contractions)   . Right ureteral stone   . S/P aortic valve replacement with prosthetic valve    a. 04/13/2006 s/p St Jude mechnical AVR for severe AS;  b. 09/2014 Echo: EF 40-45%, gr1 DD, mild AI/MR, triv TR/PR.  . SUI (stress urinary incontinence, female)   . Unstable balance    uses cane  . Venous varices      Past Surgical History:  Procedure Laterality Date  . ANTERIOR AND POSTERIOR REPAIR  1990   cystocele/ rectocele  . AORTIC VALVE REPLACEMENT  04/13/06   dr gerhardt   and Replacement Ascending Aorta (St. Jude mechanical prosthesis)  . APPENDECTOMY  child 1950  . BREAST SURGERY Left 2004  . CARDIAC CATHETERIZATION  02-22-2001   dr Martinique   minimal non-obstructive cad/  mild aortic stenosis and aortic root enlargement/  normal LVF, ef 65%  . CARDIAC CATHETERIZATION  03-22-2006    dr Martinique   no significant obstructive cad/  severe aortic stenosis and mild to moderate aortic root enlargement/  upper  normal right heart pressures  . CARDIOVASCULAR STRESS TEST  09-30-2011   dr Martinique   lexiscan nuclear study w/ apical thinning  vs  small prior apical infarct w/ no significant ischemia/  hypokinesis of the distal septum and apex, ef 50%  . CARPAL TUNNEL RELEASE Bilateral left 09-09-2004/  right 2007  . CATARACT EXTRACTION W/ INTRAOCULAR LENS  IMPLANT, BILATERAL  2015  . COLONOSCOPY N/A 10/03/2014   Procedure: COLONOSCOPY;  Surgeon: [name], MD;  Location: [facility];  Service: Endoscopy;  Laterality: N/A;  . CYSTOSCOPY WITH RETROGRADE PYELOGRAM, URETEROSCOPY AND STENT PLACEMENT Right 09/17/2016   Procedure: CYSTOSCOPY WITH RIGHT RETROGRADE PYELOGRAM, RIGHT URETEROSCOPY AND STENT PLACEMENT LASER LITHOTRIPSY, RIGHT  STENT PLACEMENT;  Surgeon: [name], MD;  Location: [facility];  Service: Urology;  Laterality: Right;  . ESOPHAGEAL MANOMETRY N/A 08/12/2014   Procedure: ESOPHAGEAL MANOMETRY (EM);  Surgeon: [name], MD;  Location: [facility];  Service: Endoscopy;  Laterality: N/A;  . ESOPHAGOGASTRODUODENOSCOPY N/A 10/03/2014   Procedure: ESOPHAGOGASTRODUODENOSCOPY (EGD);  Surgeon: [name], MD;  Location: [facility] ENDOSCOPY;  Service: Endoscopy;  Laterality: N/A;  . [place]  . FOOT SURGERY Right 2013   hammertoe x2  . HOLMIUM LASER APPLICATION Right 9/62/8366   Procedure: HOLMIUM LASER APPLICATION;  Surgeon: [name], MD;  Location: [facility];  Service: Urology;  Laterality: Right;  . PERCUTANEOUS NEPHROSTOLITHOTOMY  1993  . STONE EXTRACTION WITH BASKET Right 09/17/2016   Procedure: STONE EXTRACTION WITH BASKET;  Surgeon: [name], MD;  Location: [facility];  Service: Urology;  Laterality: Right;  . TOTAL ABDOMINAL HYSTERECTOMY  1971   w/ Bilateral Salpingoophorectomy  . TRANSTHORACIC ECHOCARDIOGRAM  10/24/2014   dr Martinique   hypokinesis of the basal and mid inferoseptal and inferior walls,  ef 40-45%,  grade 1 diastolic dysfunction/  mechanical bileaflet aortic valve noted w/ mild regur., peak grandient 21mHg, valve area 1.35cm^2/  severe MV calcification without stenosis w/ mild regurg., peak gradient 353mg/  mild LAE/  poss. atrial mass (MRI showed benign lipomatous hypertrophy of atrial septum) /tFrazier ButtR/mild TR    There were no vitals filed for this visit.       Treatment: Reviewed PWR! exercises in seated 10 reps each, min v.c. For posture and correct technique. Arm bike x 6 mins level 1 for conditioning pt maintained 40 rpm. Adapted strategies for feeding(pt does better when she chokes down on spoon with firm grasp) and for cutting food with big movements, with min-mod v.c.  And improved performance. Handwriting activities, after PWR! Hands, Pt demonstrates improved size and legibility if she pauses in between each word.                         OT Short Term Goals - 01/20/17 2104      OT SHORT TERM GOAL #1   Title Pt will be independent with updated HEP.--check STGs 02/19/17   Time 4   Period Weeks   Status New     OT SHORT TERM GOAL #2   Title Pt will report improved ability to cut food and decr spills when eating with adaptive strategies.   Time 4   Period Weeks   Status New     OT SHORT TERM GOAL #3   Title Pt will improve functional reaching/coordination for ADLs as shown by improving score on box and blocks test by at least  4 with dominant RUE.   Time 4   Period Weeks   Status New     OT SHORT TERM GOAL #4   Title Pt will write at least 2 sentences/grocery list with at least 75% legibility and only min decr in size.   Baseline 60% legibility, mod micrographia   Time 4   Period Weeks   Status New     OT SHORT TERM GOAL #5   Title ---------------           OT Long Term Goals - 01/20/17 2108      OT LONG TERM GOAL #1   Title Pt will verbalize understanding of adaptive strategies/AE for ADLs/IADLs to incr safety/ease/independence (including eating, buttoning, styling hair, writing, and sitting on toilet).--check LTGs 03/19/17   Time 8   Period Weeks   Status New     OT LONG TERM GOAL #2   Title Pt will improve coordination for ADLs as shown by reducing time on 9-hole peg test by at least 4 sec with dominant RUE.   Baseline R-28.47sec   Time 8   Period Weeks   Status New     OT LONG TERM GOAL #3   Title Pt will improve coordination/functional reaching as shown by improving box and blocks test by at least 8 with dominant RUE.   Baseline R-40, L-48 blocks   Time 8   Period Weeks   Status New     OT LONG TERM GOAL #4   Title Pt will improve functional reaching/balance for IADLs as shown by improving standing  functional reach by at least 1" with LUE.   Baseline R-11", L-9"   Time 8   Period Weeks   Status New     OT LONG TERM GOAL #5   Title Pt will write at least 2 sentences/grocery list with at least 90% legibility and only min decr in size.   Baseline approx 60% legibility with mod micrographia   Time 8   Period Weeks   Status New               Plan - 02/10/17 1341    Clinical Impression Statement Pt is progressing towards goals for adapted strategies with ADLs.   Rehab Potential Good   OT Frequency 2x / week   OT Duration 8 weeks      Patient will benefit from skilled therapeutic intervention in order to improve the following deficits and impairments:  Decreased balance, Decreased knowledge of precautions, Decreased safety awareness, Impaired UE functional use, Impaired tone, Improper body mechanics, Decreased mobility, Decreased coordination, Decreased cognition, Decreased knowledge of use of DME, Impaired vision/preception  Visit Diagnosis: Other symptoms and signs involving the nervous system  Other symptoms and signs involving the musculoskeletal system  Other lack of coordination  Frontal lobe and executive function deficit    Problem List Patient Active Problem List   Diagnosis Date Noted  . Elevated troponin 09/19/2016  . Emesis 09/19/2016  . UTI (urinary tract infection) 09/19/2016  . Sinus bradycardia 09/16/2016  . Family history of colon cancer 07/30/2014  . Hiatal hernia 07/30/2014  . Chest pain 10/13/2011  . Disequilibrium 10/13/2011  . Heart valve replaced by other means 04/16/2011  . Chronic anticoagulation 03/17/2011  . S/P AVR (aortic valve replacement)   . PVC's (premature ventricular contractions)   . HTN (hypertension)   . Hypercholesteremia   . Atrial fibrillation with RVR ([place])   . GERD (gastroesophageal reflux disease)   . GERD 02/06/2010  .  DIVERTICULOSIS-COLON 02/06/2010  . PERSONAL HX COLONIC POLYPS 02/06/2010  . MASTECTOMY, HX  OF 02/06/2010    Grisell Bissette 02/10/2017, 1:43 PM  [facility] [address] [place], [place], [address] Phone: [phone]   Fax:  [phone]  Name: [name] MRN: [id number] Date of Birth: 09-Jun-1941

## 2017-02-17 ENCOUNTER — Ambulatory Visit: Payer: PPO

## 2017-02-17 DIAGNOSIS — R471 Dysarthria and anarthria: Secondary | ICD-10-CM

## 2017-02-17 NOTE — Patient Instructions (Signed)
  Please complete the assigned speech therapy homework prior to your next session and return it to the speech therapist at your next visit.  

## 2017-02-17 NOTE — Therapy (Signed)
[facility] [address] [place], [place], [address] Phone: [phone]   Fax:  [phone]  Speech Language Pathology Treatment  Patient Details  Name: [name] MRN: [id number] Date of Birth: [birth year] Referring Provider: Alonza Bogus DO  Encounter Date: 02/17/2017      End of Session - 02/17/17 1319    Visit Number 4   Number of Visits 17   Date for SLP Re-Evaluation 03/25/17   SLP Start Time 0805   SLP Stop Time  0846   SLP Time Calculation (min) 41 min   Activity Tolerance Patient tolerated treatment well      Past Medical History:  Diagnosis Date  . Anticoagulated on Coumadin   . Arthropathy of cervical facet joint    C2-3  . At risk for falls   . BPV (benign positional vertigo)   . Bruises easily   . Cancer ([place]) 2004    breast-lumpectomy,nodes ,radiation,chemo  . Chronic neck pain   . CKD (chronic kidney disease), stage II   . Diverticulosis of colon   . Elevated troponin    a. 08/2016 in setting of PAF;  b. 08/2016 low risk MV w/ fixed septal defect (LBBB), EF 53%.  Marland Kitchen GERD (gastroesophageal reflux disease)   . Hiatal hernia   . History of aortic valve stenosis   . History of breast cancer per pt no recurrence   dx 2004-- Left breast DCIS (ER/PR+, HER2 negative) s/p  partial masectomy w/ sln bx and  chemoradiotion (completed 2004)  . History of colon polyps   . History of herpes zoster   . History of kidney stones   . History of kidney stones   . HTN (hypertension)   . Hyperlipidemia   . Hypertrophy of inter-atrial septum    a. 09/2014 Echo: ? of atrial mass; b. 10/2014 Cardiac MRI: no atrial septal mass - polypoid lipomatous hypertrophy of superior atrial septum noted-->Benign.  . Irritable bowel syndrome   . LBBB (left bundle branch block)   . PAF (paroxysmal atrial fibrillation) (HCC)    a. CHA2DS2VASc = 4-->coumadin.  . Parkinsonism [facility])    neurologist-  dr tat  . PONV  (postoperative nausea and vomiting)   . PVC's (premature ventricular contractions)   . Right ureteral stone   . S/P aortic valve replacement with prosthetic valve    a. 04/13/2006 s/p St Jude mechnical AVR for severe AS;  b. 09/2014 Echo: EF 40-45%, gr1 DD, mild AI/MR, triv TR/PR.  . SUI (stress urinary incontinence, female)   . Unstable balance    uses cane  . Venous varices      Past Surgical History:  Procedure Laterality Date  . ANTERIOR AND POSTERIOR REPAIR  1990   cystocele/ rectocele  . AORTIC VALVE REPLACEMENT  04/13/06   dr gerhardt   and Replacement Ascending Aorta (St. Jude mechanical prosthesis)  . APPENDECTOMY  child 1950  . BREAST SURGERY Left 2004  . CARDIAC CATHETERIZATION  02-22-2001   dr Martinique   minimal non-obstructive cad/  mild aortic stenosis and aortic root enlargement/  normal LVF, ef 65%  . CARDIAC CATHETERIZATION  03-22-2006    dr Martinique   no significant obstructive cad/  severe aortic stenosis and mild to moderate aortic root enlargement/  upper normal right heart pressures  . CARDIOVASCULAR STRESS TEST  09-30-2011   dr Martinique   lexiscan nuclear study w/ apical thinning  vs  small prior apical infarct w/ no significant ischemia/  hypokinesis of the distal septum and apex, ef 50%  . CARPAL TUNNEL RELEASE Bilateral left 09-09-2004/  right 2007  . CATARACT EXTRACTION W/ INTRAOCULAR LENS  IMPLANT, BILATERAL  2015  . COLONOSCOPY N/A 10/03/2014   Procedure: COLONOSCOPY;  Surgeon: [name], MD;  Location: [facility];  Service: Endoscopy;  Laterality: N/A;  . CYSTOSCOPY WITH RETROGRADE PYELOGRAM, URETEROSCOPY AND STENT PLACEMENT Right 09/17/2016   Procedure: CYSTOSCOPY WITH RIGHT RETROGRADE PYELOGRAM, RIGHT URETEROSCOPY AND STENT PLACEMENT LASER LITHOTRIPSY, RIGHT STENT PLACEMENT;  Surgeon: [name], MD;  Location: [facility];  Service: Urology;  Laterality: Right;  . ESOPHAGEAL MANOMETRY N/A 08/12/2014   Procedure: ESOPHAGEAL MANOMETRY  (EM);  Surgeon: [name], MD;  Location: [facility];  Service: Endoscopy;  Laterality: N/A;  . ESOPHAGOGASTRODUODENOSCOPY N/A 10/03/2014   Procedure: ESOPHAGOGASTRODUODENOSCOPY (EGD);  Surgeon: [name], MD;  Location: [facility] ENDOSCOPY;  Service: Endoscopy;  Laterality: N/A;  . [place]  . FOOT SURGERY Right 2013   hammertoe x2  . HOLMIUM LASER APPLICATION Right 2/68/3419   Procedure: HOLMIUM LASER APPLICATION;  Surgeon: [name], MD;  Location: [facility];  Service: Urology;  Laterality: Right;  . PERCUTANEOUS NEPHROSTOLITHOTOMY  1993  . STONE EXTRACTION WITH BASKET Right 09/17/2016   Procedure: STONE EXTRACTION WITH BASKET;  Surgeon: [name], MD;  [facility];  Service: Urology;  Laterality: Right;  . TOTAL ABDOMINAL HYSTERECTOMY  1971   w/ Bilateral Salpingoophorectomy  . TRANSTHORACIC ECHOCARDIOGRAM  10/24/2014   dr Martinique   hypokinesis of the basal and mid inferoseptal and inferior walls,  ef 40-45%,  grade 1 diastolic dysfunction/  mechanical bileaflet aortic valve noted w/ mild regur., peak grandient 73mHg, valve area 1.35cm^2/  severe MV calcification without stenosis w/ mild regurg., peak gradient 371mg/  mild LAE/  poss. atrial mass (MRI showed benign lipomatous hypertrophy of atrial septum) /tFrazier ButtR/mild TR    There were no vitals filed for this visit.      Subjective Assessment - 02/17/17 0812    Subjective "I have been saying loud "ah" 5 times twice a day, and reading sentences and repeating words."   Currently in Pain? No/denies               ADULT SLP TREATMENT - 02/17/17 0813      General Information   Behavior/Cognition Alert;Cooperative;Pleasant mood     Treatment Provided   Treatment provided Cognitive-Linquistic     Cognitive-Linquistic Treatment   Treatment focused on Dysarthria   Skilled Treatment SLP facilitated pt's loudness recalibration  with loud /a/ average 89dB with consistent mod A for maintaining loudness. In sentence level and multi-sentence responses pt maintained average 70dB with rare min A. Pt was more successful after initial SLP cues for loudness and incr'd effort.      Assessment / Recommendations / Plan   Plan Continue with current plan of care     Progression Toward Goals   Progression toward goals Progressing toward goals            SLP Short Term Goals - 02/17/17 1320      SLP SHORT TERM GOAL #1   Title pt will produce loud /a/ with proper voicing at average low 90's dB over three sessions   Time 2   Period Weeks   Status On-going     SLP SHORT TERM GOAL #2   Title pt will converse for 10 minutes re: simple  topic with appropriate breath support for average volume in low 70s dB.   Time 2   Period Weeks   Status On-going     SLP SHORT TERM GOAL #3   Title pt will exhibit no greater than 2 episodes of rushing of speech in 10 minutes simple conversation over three sessions   Time 2   Period Weeks   Status On-going          SLP Long Term Goals - 02/17/17 [place] #1   Title pt will demo no more than one episode of rushing of speech in 15 minutes mod complex conversation over two sessions   Time 6   Period Weeks   Status On-going     SLP LONG TERM GOAL #2   Title pt will achieve 15 minutes mod complex conversation with appropriate breath support to allow no > 1 instance of speaking on residual air   Time 6   Period Weeks   Status On-going     SLP LONG TERM GOAL #3   Title pt will sustain /a/ at average low 90s dB over 6 sessions   Time 6   Period Weeks   Status On-going          Plan - 02/17/17 1319    Clinical Impression Statement Pt performed better today with rare min A with sentence/multi-sentence tasks - average 70dB. Short rushes of speech rarely today. Continue skilled ST to maximize intelligiblity.    Speech Therapy Frequency 2x / week    Treatment/Interventions Compensatory strategies;Functional tasks;Patient/family education;Cueing hierarchy;Internal/external aids;SLP instruction and feedback   Potential to Achieve Goals Good      Patient will benefit from skilled therapeutic intervention in order to improve the following deficits and impairments:   Dysarthria and anarthria    Problem List Patient Active Problem List   Diagnosis Date Noted  . Elevated troponin 09/19/2016  . Emesis 09/19/2016  . UTI (urinary tract infection) 09/19/2016  . Sinus bradycardia 09/16/2016  . Family history of colon cancer 07/30/2014  . Hiatal hernia 07/30/2014  . Chest pain 10/13/2011  . Disequilibrium 10/13/2011  . Heart valve replaced by other means 04/16/2011  . Chronic anticoagulation 03/17/2011  . S/P AVR (aortic valve replacement)   . PVC's (premature ventricular contractions)   . HTN (hypertension)   . Hypercholesteremia   . Atrial fibrillation with RVR ([place])   . GERD (gastroesophageal reflux disease)   . GERD 02/06/2010  . DIVERTICULOSIS-COLON 02/06/2010  . PERSONAL HX COLONIC POLYPS 02/06/2010  . MASTECTOMY, HX OF 02/06/2010    [facility] ,MS, CCC-SLP  02/17/2017, 1:21 PM  [facility] [address] [place], [place], [address] Phone: [phone]   Fax:  [phone]   Name: [name] MRN: [id number] Date of Birth: 1941/01/27

## 2017-02-22 ENCOUNTER — Ambulatory Visit: Payer: PPO | Admitting: Occupational Therapy

## 2017-02-22 DIAGNOSIS — R293 Abnormal posture: Secondary | ICD-10-CM

## 2017-02-22 DIAGNOSIS — R41842 Visuospatial deficit: Secondary | ICD-10-CM

## 2017-02-22 DIAGNOSIS — R2681 Unsteadiness on feet: Secondary | ICD-10-CM

## 2017-02-22 DIAGNOSIS — R29898 Other symptoms and signs involving the musculoskeletal system: Secondary | ICD-10-CM

## 2017-02-22 DIAGNOSIS — R29818 Other symptoms and signs involving the nervous system: Secondary | ICD-10-CM

## 2017-02-22 DIAGNOSIS — R41844 Frontal lobe and executive function deficit: Secondary | ICD-10-CM

## 2017-02-22 DIAGNOSIS — R471 Dysarthria and anarthria: Secondary | ICD-10-CM | POA: Diagnosis not present

## 2017-02-22 DIAGNOSIS — R278 Other lack of coordination: Secondary | ICD-10-CM

## 2017-02-22 DIAGNOSIS — R2689 Other abnormalities of gait and mobility: Secondary | ICD-10-CM

## 2017-02-22 NOTE — Therapy (Signed)
[facility] [address] [place], [place], [address] Phone: [phone]   Fax:  [phone]  Occupational Therapy Treatment  Patient Details  Name: [name] MRN: [id number] Date of Birth: 1941-08-23 Referring Provider: Dr. Wells Guiles Tat  Encounter Date: 02/22/2017      OT End of Session - 02/22/17 0808    Visit Number 4   Number of Visits 17   Date for OT Re-Evaluation 03/19/17   Authorization Type [employer], no visit limit/no auth, G-code needed   Authorization Time Period scheduled through 03/10/17 currently   Authorization - Visit Number 4   Authorization - Number of Visits 10   OT Start Time 0804   OT Stop Time 0845   OT Time Calculation (min) 41 min   Activity Tolerance Patient tolerated treatment well   Behavior During Therapy [facility] for tasks assessed/performed      Past Medical History:  Diagnosis Date  . Anticoagulated on Coumadin   . Arthropathy of cervical facet joint    C2-3  . At risk for falls   . BPV (benign positional vertigo)   . Bruises easily   . Cancer ([place]) 2004    breast-lumpectomy,nodes ,radiation,chemo  . Chronic neck pain   . CKD (chronic kidney disease), stage II   . Diverticulosis of colon   . Elevated troponin    a. 08/2016 in setting of PAF;  b. 08/2016 low risk MV w/ fixed septal defect (LBBB), EF 53%.  Marland Kitchen GERD (gastroesophageal reflux disease)   . Hiatal hernia   . History of aortic valve stenosis   . History of breast cancer per pt no recurrence   dx 2004-- Left breast DCIS (ER/PR+, HER2 negative) s/p  partial masectomy w/ sln bx and  chemoradiotion (completed 2004)  . History of colon polyps   . History of herpes zoster   . History of kidney stones   . History of kidney stones   . HTN (hypertension)   . Hyperlipidemia   . Hypertrophy of inter-atrial septum    a. 09/2014 Echo: ? of atrial mass; b. 10/2014 Cardiac MRI: no atrial septal mass - polypoid  lipomatous hypertrophy of superior atrial septum noted-->Benign.  . Irritable bowel syndrome   . LBBB (left bundle branch block)   . PAF (paroxysmal atrial fibrillation) (HCC)    a. CHA2DS2VASc = 4-->coumadin.  . Parkinsonism [facility])    neurologist-  dr tat  . PONV (postoperative nausea and vomiting)   . PVC's (premature ventricular contractions)   . Right ureteral stone   . S/P aortic valve replacement with prosthetic valve    a. 04/13/2006 s/p St Jude mechnical AVR for severe AS;  b. 09/2014 Echo: EF 40-45%, gr1 DD, mild AI/MR, triv TR/PR.  . SUI (stress urinary incontinence, female)   . Unstable balance    uses cane  . Venous varices      Past Surgical History:  Procedure Laterality Date  . ANTERIOR AND POSTERIOR REPAIR  1990   cystocele/ rectocele  . AORTIC VALVE REPLACEMENT  04/13/06   dr gerhardt   and Replacement Ascending Aorta (St. Jude mechanical prosthesis)  . APPENDECTOMY  child 1950  . BREAST SURGERY Left 2004  . CARDIAC CATHETERIZATION  02-22-2001   dr Martinique   minimal non-obstructive cad/  mild aortic stenosis and aortic root enlargement/  normal LVF, ef 65%  . CARDIAC CATHETERIZATION  03-22-2006    dr Martinique   no significant obstructive cad/  severe aortic stenosis  and mild to moderate aortic root enlargement/  upper normal right heart pressures  . CARDIOVASCULAR STRESS TEST  09-30-2011   dr Martinique   lexiscan nuclear study w/ apical thinning  vs  small prior apical infarct w/ no significant ischemia/  hypokinesis of the distal septum and apex, ef 50%  . CARPAL TUNNEL RELEASE Bilateral left 09-09-2004/  right 2007  . CATARACT EXTRACTION W/ INTRAOCULAR LENS  IMPLANT, BILATERAL  2015  . COLONOSCOPY N/A 10/03/2014   Procedure: COLONOSCOPY;  Surgeon: [name], MD;  Location: [facility];  Service: Endoscopy;  Laterality: N/A;  . CYSTOSCOPY WITH RETROGRADE PYELOGRAM, URETEROSCOPY AND STENT PLACEMENT Right 09/17/2016   Procedure: CYSTOSCOPY WITH RIGHT RETROGRADE  PYELOGRAM, RIGHT URETEROSCOPY AND STENT PLACEMENT LASER LITHOTRIPSY, RIGHT STENT PLACEMENT;  Surgeon: [name], MD;  [facility];  Service: Urology;  Laterality: Right;  . ESOPHAGEAL MANOMETRY N/A 08/12/2014   Procedure: ESOPHAGEAL MANOMETRY (EM);  Surgeon: [name], MD;  Location: [facility];  Service: Endoscopy;  Laterality: N/A;  . ESOPHAGOGASTRODUODENOSCOPY N/A 10/03/2014   Procedure: ESOPHAGOGASTRODUODENOSCOPY (EGD);  Surgeon: [name], MD;  Location: [facility] ENDOSCOPY;  Service: Endoscopy;  Laterality: N/A;  . [place]  . FOOT SURGERY Right 2013   hammertoe x2  . HOLMIUM LASER APPLICATION Right 8/75/6433   Procedure: HOLMIUM LASER APPLICATION;  Surgeon: [name], MD;  Location: [facility];  Service: Urology;  Laterality: Right;  . PERCUTANEOUS NEPHROSTOLITHOTOMY  1993  . STONE EXTRACTION WITH BASKET Right 09/17/2016   Procedure: STONE EXTRACTION WITH BASKET;  Surgeon: [name], MD;  Location: [facility];  Service: Urology;  Laterality: Right;  . TOTAL ABDOMINAL HYSTERECTOMY  1971   w/ Bilateral Salpingoophorectomy  . TRANSTHORACIC ECHOCARDIOGRAM  10/24/2014   dr Martinique   hypokinesis of the basal and mid inferoseptal and inferior walls,  ef 40-45%,  grade 1 diastolic dysfunction/  mechanical bileaflet aortic valve noted w/ mild regur., peak grandient 53mHg, valve area 1.35cm^2/  severe MV calcification without stenosis w/ mild regurg., peak gradient 347mg/  mild LAE/  poss. atrial mass (MRI showed benign lipomatous hypertrophy of atrial septum) /tFrazier ButtR/mild TR    There were no vitals filed for this visit.      Subjective Assessment - 02/22/17 0807    Subjective  "I didn't sleep well last night"   Pertinent History PSP/Parkinsonism diagnosis 03/2016; hx L breast CA (s/p chemo, radiation, mastectomy,and reconstruction), hx of multiple falls, aortic valve  replacement, HTN, a-fib, GERD (see epic for details).   Limitations fall risk   Patient Stated Goals improve ADLs, writing   Currently in Pain? No/denies   Pain Onset Today       Writing:  Using built up grip and min-mod cues for PWR! Hands, slowing down, and using lines as a guide to write bigger.  Practiced writing alphabet, sentences, and grocery list.  approx 90% legibility overall with min decr in size, particularly with speed.  In standing functional step and reach forward/back with each UE/LE with focus/min cueing for deliberate step and wt. Shift for improved balance (performed with UE support).  Flipping cards with each UE with focus/min cues for timing and incr movement amplitude.  Checked STGs and discussed progress.        [facility] Assessment - 02/22/17 0001      Coordination   Box and Blocks R-40 blocks, L-48 blocks  OT Short Term Goals - 02/22/17 0820      OT SHORT TERM GOAL #1   Title Pt will be independent with updated HEP.--check STGs 02/19/17   Time 4   Period Weeks   Status On-going  02/23/17  needs reinforcement/cueing for large amplitude     OT SHORT TERM GOAL #2   Title Pt will report improved ability to cut food and decr spills when eating with adaptive strategies.   Time 4   Period Weeks   Status Achieved  02/22/17  per pt report     OT SHORT TERM GOAL #3   Title Pt will improve functional reaching/coordination for ADLs as shown by improving score on box and blocks test by at least 4 with dominant RUE.   Baseline R-40 blocks   Time 4   Period Weeks   Status On-going  02/22/17  R-40 blocks     OT SHORT TERM GOAL #4   Title Pt will write at least 2 sentences/grocery list with at least 75% legibility and only min decr in size.   Baseline 60% legibility, mod micrographia   Time 4   Period Weeks   Status Achieved  02/22/17  approx 90% legibility and min decr in size     OT SHORT TERM GOAL #5    Title ---------------           OT Long Term Goals - 02/22/17 0820      OT LONG TERM GOAL #1   Title Pt will verbalize understanding of adaptive strategies/AE for ADLs/IADLs to incr safety/ease/independence (including eating, buttoning, styling hair, writing, and sitting on toilet).--check LTGs 03/19/17   Time 8   Period Weeks   Status New     OT LONG TERM GOAL #2   Title Pt will improve coordination for ADLs as shown by reducing time on 9-hole peg test by at least 4 sec with dominant RUE.   Baseline R-28.47sec   Time 8   Period Weeks   Status New     OT LONG TERM GOAL #3   Title Pt will improve coordination/functional reaching as shown by improving box and blocks test by at least 8 with dominant RUE.   Baseline R-40, L-48 blocks   Time 8   Period Weeks   Status New     OT LONG TERM GOAL #4   Title Pt will improve functional reaching/balance for IADLs as shown by improving standing functional reach by at least 1" with LUE.   Baseline R-11", L-9"   Time 8   Period Weeks   Status New     OT LONG TERM GOAL #5   Title Pt will write at least 2 sentences/grocery list with at least 95% legibility and only min decr in size.   Baseline approx 60% legibility with mod micrographia   Time 8   Period Weeks   Status Revised  02/22/17  met initial goal of 90% and upgraded               Plan - 02/22/17 0818    Clinical Impression Statement Pt is progressing slowly towards goals with strategies for ADLs.  Pt met 2/4 STGs and demo/reports incr ease with eating and writing.   Rehab Potential Good   OT Frequency 2x / week   OT Duration 8 weeks   Plan continue with adaptive strategies/safety for ADLs, review/update coordination HEP   OT Home Exercise Plan Education provided:  reviewed PWR! sitting (basic 4), strategies for cutting/  eating   Consulted and Agree with Plan of Care Patient      Patient will benefit from skilled therapeutic intervention in order to improve the  following deficits and impairments:  Decreased balance, Decreased knowledge of precautions, Decreased safety awareness, Impaired UE functional use, Impaired tone, Improper body mechanics, Decreased mobility, Decreased coordination, Decreased cognition, Decreased knowledge of use of DME, Impaired vision/preception  Visit Diagnosis: Other symptoms and signs involving the nervous system  Other symptoms and signs involving the musculoskeletal system  Other lack of coordination  Frontal lobe and executive function deficit  Unsteadiness on feet  Other abnormalities of gait and mobility  Abnormal posture  Visuospatial deficit    Problem List Patient Active Problem List   Diagnosis Date Noted  . Elevated troponin 09/19/2016  . Emesis 09/19/2016  . UTI (urinary tract infection) 09/19/2016  . Sinus bradycardia 09/16/2016  . Family history of colon cancer 07/30/2014  . Hiatal hernia 07/30/2014  . Chest pain 10/13/2011  . Disequilibrium 10/13/2011  . Heart valve replaced by other means 04/16/2011  . Chronic anticoagulation 03/17/2011  . S/P AVR (aortic valve replacement)   . PVC's (premature ventricular contractions)   . HTN (hypertension)   . Hypercholesteremia   . Atrial fibrillation with RVR ([place])   . GERD (gastroesophageal reflux disease)   . GERD 02/06/2010  . DIVERTICULOSIS-COLON 02/06/2010  . PERSONAL HX COLONIC POLYPS 02/06/2010  . MASTECTOMY, HX OF 02/06/2010    [facility] 02/22/2017, 5:10 PM  [facility] [address] [place], [place], [address] Phone: [phone]   Fax:  [phone]  Name: [name] MRN: [id number] Date of Birth: 1941-10-03   Vianne Bulls, OTR/L [facility] [address] [place], [place]  [address] [phone] phone [phone] 02/22/17 5:12 PM

## 2017-02-24 ENCOUNTER — Ambulatory Visit: Payer: PPO | Admitting: Occupational Therapy

## 2017-02-24 ENCOUNTER — Ambulatory Visit: Payer: PPO | Attending: Neurology

## 2017-02-24 DIAGNOSIS — R29818 Other symptoms and signs involving the nervous system: Secondary | ICD-10-CM | POA: Diagnosis not present

## 2017-02-24 DIAGNOSIS — R2681 Unsteadiness on feet: Secondary | ICD-10-CM | POA: Diagnosis not present

## 2017-02-24 DIAGNOSIS — R293 Abnormal posture: Secondary | ICD-10-CM | POA: Diagnosis not present

## 2017-02-24 DIAGNOSIS — R471 Dysarthria and anarthria: Secondary | ICD-10-CM | POA: Diagnosis not present

## 2017-02-24 DIAGNOSIS — R29898 Other symptoms and signs involving the musculoskeletal system: Secondary | ICD-10-CM | POA: Diagnosis not present

## 2017-02-24 DIAGNOSIS — R41842 Visuospatial deficit: Secondary | ICD-10-CM | POA: Insufficient documentation

## 2017-02-24 DIAGNOSIS — R2689 Other abnormalities of gait and mobility: Secondary | ICD-10-CM | POA: Diagnosis not present

## 2017-02-24 DIAGNOSIS — R41844 Frontal lobe and executive function deficit: Secondary | ICD-10-CM | POA: Insufficient documentation

## 2017-02-24 DIAGNOSIS — R278 Other lack of coordination: Secondary | ICD-10-CM

## 2017-02-24 NOTE — Therapy (Signed)
[facility] [address] [place], [place], [address] Phone: [phone]   Fax:  [phone]  Speech Language Pathology Treatment  Patient Details  Name: [name] MRN: [id number] Date of Birth: 1941/09/10 Referring Provider: Alonza Bogus DO  Encounter Date: 02/24/2017      End of Session - 02/24/17 1530    Visit Number 5   Number of Visits 17   Date for SLP Re-Evaluation 03/25/17   SLP Start Time 1450   SLP Stop Time  1531   SLP Time Calculation (min) 41 min   Activity Tolerance Patient tolerated treatment well      Past Medical History:  Diagnosis Date  . Anticoagulated on Coumadin   . Arthropathy of cervical facet joint    C2-3  . At risk for falls   . BPV (benign positional vertigo)   . Bruises easily   . Cancer ([place]) 2004    breast-lumpectomy,nodes ,radiation,chemo  . Chronic neck pain   . CKD (chronic kidney disease), stage II   . Diverticulosis of colon   . Elevated troponin    a. 08/2016 in setting of PAF;  b. 08/2016 low risk MV w/ fixed septal defect (LBBB), EF 53%.  Marland Kitchen GERD (gastroesophageal reflux disease)   . Hiatal hernia   . History of aortic valve stenosis   . History of breast cancer per pt no recurrence   dx 2004-- Left breast DCIS (ER/PR+, HER2 negative) s/p  partial masectomy w/ sln bx and  chemoradiotion (completed 2004)  . History of colon polyps   . History of herpes zoster   . History of kidney stones   . History of kidney stones   . HTN (hypertension)   . Hyperlipidemia   . Hypertrophy of inter-atrial septum    a. 09/2014 Echo: ? of atrial mass; b. 10/2014 Cardiac MRI: no atrial septal mass - polypoid lipomatous hypertrophy of superior atrial septum noted-->Benign.  . Irritable bowel syndrome   . LBBB (left bundle branch block)   . PAF (paroxysmal atrial fibrillation) (HCC)    a. CHA2DS2VASc = 4-->coumadin.  . Parkinsonism [facility])    neurologist-  dr tat  . PONV  (postoperative nausea and vomiting)   . PVC's (premature ventricular contractions)   . Right ureteral stone   . S/P aortic valve replacement with prosthetic valve    a. 04/13/2006 s/p St Jude mechnical AVR for severe AS;  b. 09/2014 Echo: EF 40-45%, gr1 DD, mild AI/MR, triv TR/PR.  . SUI (stress urinary incontinence, female)   . Unstable balance    uses cane  . Venous varices      Past Surgical History:  Procedure Laterality Date  . ANTERIOR AND POSTERIOR REPAIR  1990   cystocele/ rectocele  . AORTIC VALVE REPLACEMENT  04/13/06   dr gerhardt   and Replacement Ascending Aorta (St. Jude mechanical prosthesis)  . APPENDECTOMY  child 1950  . BREAST SURGERY Left 2004  . CARDIAC CATHETERIZATION  02-22-2001   dr Martinique   minimal non-obstructive cad/  mild aortic stenosis and aortic root enlargement/  normal LVF, ef 65%  . CARDIAC CATHETERIZATION  03-22-2006    dr Martinique   no significant obstructive cad/  severe aortic stenosis and mild to moderate aortic root enlargement/  upper normal right heart pressures  . CARDIOVASCULAR STRESS TEST  09-30-2011   dr Martinique   lexiscan nuclear study w/ apical thinning  vs  small prior apical infarct w/ no significant ischemia/  hypokinesis of the distal septum and apex, ef 50%  . CARPAL TUNNEL RELEASE Bilateral left 09-09-2004/  right 2007  . CATARACT EXTRACTION W/ INTRAOCULAR LENS  IMPLANT, BILATERAL  2015  . COLONOSCOPY N/A 10/03/2014   Procedure: COLONOSCOPY;  Surgeon: [name], MD;  Location: [facility];  Service: Endoscopy;  Laterality: N/A;  . CYSTOSCOPY WITH RETROGRADE PYELOGRAM, URETEROSCOPY AND STENT PLACEMENT Right 09/17/2016   Procedure: CYSTOSCOPY WITH RIGHT RETROGRADE PYELOGRAM, RIGHT URETEROSCOPY AND STENT PLACEMENT LASER LITHOTRIPSY, RIGHT STENT PLACEMENT;  Surgeon: [name], MD;  Location: [facility];  Service: Urology;  Laterality: Right;  . ESOPHAGEAL MANOMETRY N/A 08/12/2014   Procedure: ESOPHAGEAL MANOMETRY  (EM);  Surgeon: [name], MD;  Location: [facility];  Service: Endoscopy;  Laterality: N/A;  . ESOPHAGOGASTRODUODENOSCOPY N/A 10/03/2014   Procedure: ESOPHAGOGASTRODUODENOSCOPY (EGD);  Surgeon: [name], MD;  Location: [facility] ENDOSCOPY;  Service: Endoscopy;  Laterality: N/A;  . [place]  . FOOT SURGERY Right 2013   hammertoe x2  . HOLMIUM LASER APPLICATION Right 2/58/5277   Procedure: HOLMIUM LASER APPLICATION;  Surgeon: [name], MD;  [facility];  Service: Urology;  Laterality: Right;  . PERCUTANEOUS NEPHROSTOLITHOTOMY  1993  . STONE EXTRACTION WITH BASKET Right 09/17/2016   Procedure: STONE EXTRACTION WITH BASKET;  Surgeon: [name], MD;  Location: [facility];  Service: Urology;  Laterality: Right;  . TOTAL ABDOMINAL HYSTERECTOMY  1971   w/ Bilateral Salpingoophorectomy  . TRANSTHORACIC ECHOCARDIOGRAM  10/24/2014   dr Martinique   hypokinesis of the basal and mid inferoseptal and inferior walls,  ef 40-45%,  grade 1 diastolic dysfunction/  mechanical bileaflet aortic valve noted w/ mild regur., peak grandient 70mHg, valve area 1.35cm^2/  severe MV calcification without stenosis w/ mild regurg., peak gradient 333mg/  mild LAE/  poss. atrial mass (MRI showed benign lipomatous hypertrophy of atrial septum) /tFrazier ButtR/mild TR    There were no vitals filed for this visit.      Subjective Assessment - 02/24/17 1453    Subjective "Just talk louder"   Currently in Pain? No/denies               ADULT SLP TREATMENT - 02/24/17 1454      General Information   Behavior/Cognition Alert;Cooperative;Pleasant mood     Treatment Provided   Treatment provided Cognitive-Linquistic     Cognitive-Linquistic Treatment   Treatment focused on Dysarthria   Skilled Treatment SLP facilitated pt's loudness recalibration with loud /a/ average 90dB with occasional min A for maintaining loudness. In  sentence level responses with picture description pt maintained 70dB average, and in multi-sentence responses pt maintained average 70dB with rare min A. In conversation pt also maintained 70dB with occasional mod A.      Assessment / Recommendations / Plan   Plan Continue with current plan of care     Progression Toward Goals   Progression toward goals Progressing toward goals            SLP Short Term Goals - 02/24/17 1533      SLP SHORT TERM GOAL #1   Title pt will produce loud /a/ with proper voicing at average low 90's dB over three sessions   Time 4   Period --  visits   Status On-going     SLP SHORT TERM GOAL #2   Title pt will converse for 10 minutes re: simple topic with appropriate breath support for  average volume in low 70s dB.   Time 4   Period --  visits   Status On-going     SLP SHORT TERM GOAL #3   Title pt will exhibit no greater than 2 episodes of rushing of speech in 10 minutes simple conversation over three sessions   Time 4   Period --  vistis   Status On-going          SLP Long Term Goals - 02/24/17 1534      SLP LONG TERM GOAL #1   Title pt will demo no more than one episode of rushing of speech in 15 minutes mod complex conversation over two sessions   Time 5   Period Weeks   Status On-going     SLP LONG TERM GOAL #2   Title pt will achieve 15 minutes mod complex conversation with appropriate breath support to allow no > 1 instance of speaking on residual air   Time 5   Period Weeks   Status On-going     SLP LONG TERM GOAL #3   Title pt will sustain /a/ at average low 90s dB over 6 sessions   Time 5   Period Weeks   Status On-going          Plan - 02/24/17 1530    Clinical Impression Statement Pt maintained WNL loudness in sentence, multisentence and conversation today, with most frequent cues necessary in conversation. Short rushes of speech rarely today. Pt has only been seen average once per last three weeks - will extend  STGs to 8 visits. Continue skilled ST to maximize intelligiblity.    Speech Therapy Frequency 2x / week   Treatment/Interventions Compensatory strategies;Functional tasks;Patient/family education;Cueing hierarchy;Internal/external aids;SLP instruction and feedback   Potential to Achieve Goals Good      Patient will benefit from skilled therapeutic intervention in order to improve the following deficits and impairments:   Dysarthria and anarthria    Problem List Patient Active Problem List   Diagnosis Date Noted  . Elevated troponin 09/19/2016  . Emesis 09/19/2016  . UTI (urinary tract infection) 09/19/2016  . Sinus bradycardia 09/16/2016  . Family history of colon cancer 07/30/2014  . Hiatal hernia 07/30/2014  . Chest pain 10/13/2011  . Disequilibrium 10/13/2011  . Heart valve replaced by other means 04/16/2011  . Chronic anticoagulation 03/17/2011  . S/P AVR (aortic valve replacement)   . PVC's (premature ventricular contractions)   . HTN (hypertension)   . Hypercholesteremia   . Atrial fibrillation with RVR ([place])   . GERD (gastroesophageal reflux disease)   . GERD 02/06/2010  . DIVERTICULOSIS-COLON 02/06/2010  . PERSONAL HX COLONIC POLYPS 02/06/2010  . MASTECTOMY, HX OF 02/06/2010    [facility] ,MS, CCC-SLP  02/24/2017, 3:37 PM  [facility] [address] [place], [place], [address] Phone: [phone]   Fax:  [phone]   Name: KYNSLIE RINGLE MRN: [id number] Date of Birth: [birth year]

## 2017-02-24 NOTE — Therapy (Signed)
[facility] [address] [place], [place], [address] Phone: [phone]   Fax:  [phone]  Occupational Therapy Treatment  Patient Details  Name: [name] MRN: [id number] Date of Birth: 24-Nov-1941 Referring Provider: Dr. Wells Guiles Tat  Encounter Date: 02/24/2017      OT End of Session - 02/24/17 1558    Visit Number 5   Number of Visits 17   Date for OT Re-Evaluation 03/19/17   Authorization Type [employer], no visit limit/no auth, G-code needed   Authorization Time Period scheduled through 03/10/17 currently   Authorization - Visit Number 5   Authorization - Number of Visits 10   OT Start Time 1534   OT Stop Time 1615   OT Time Calculation (min) 41 min   Activity Tolerance Patient tolerated treatment well   Behavior During Therapy [facility] for tasks assessed/performed      Past Medical History:  Diagnosis Date  . Anticoagulated on Coumadin   . Arthropathy of cervical facet joint    C2-3  . At risk for falls   . BPV (benign positional vertigo)   . Bruises easily   . Cancer ([place]) 2004    breast-lumpectomy,nodes ,radiation,chemo  . Chronic neck pain   . CKD (chronic kidney disease), stage II   . Diverticulosis of colon   . Elevated troponin    a. 08/2016 in setting of PAF;  b. 08/2016 low risk MV w/ fixed septal defect (LBBB), EF 53%.  Marland Kitchen GERD (gastroesophageal reflux disease)   . Hiatal hernia   . History of aortic valve stenosis   . History of breast cancer per pt no recurrence   dx 2004-- Left breast DCIS (ER/PR+, HER2 negative) s/p  partial masectomy w/ sln bx and  chemoradiotion (completed 2004)  . History of colon polyps   . History of herpes zoster   . History of kidney stones   . History of kidney stones   . HTN (hypertension)   . Hyperlipidemia   . Hypertrophy of inter-atrial septum    a. 09/2014 Echo: ? of atrial mass; b. 10/2014 Cardiac MRI: no atrial septal mass - polypoid lipomatous  hypertrophy of superior atrial septum noted-->Benign.  . Irritable bowel syndrome   . LBBB (left bundle branch block)   . PAF (paroxysmal atrial fibrillation) (HCC)    a. CHA2DS2VASc = 4-->coumadin.  . Parkinsonism [facility])    neurologist-  dr tat  . PONV (postoperative nausea and vomiting)   . PVC's (premature ventricular contractions)   . Right ureteral stone   . S/P aortic valve replacement with prosthetic valve    a. 04/13/2006 s/p St Jude mechnical AVR for severe AS;  b. 09/2014 Echo: EF 40-45%, gr1 DD, mild AI/MR, triv TR/PR.  . SUI (stress urinary incontinence, female)   . Unstable balance    uses cane  . Venous varices      Past Surgical History:  Procedure Laterality Date  . ANTERIOR AND POSTERIOR REPAIR  1990   cystocele/ rectocele  . AORTIC VALVE REPLACEMENT  04/13/06   dr gerhardt   and Replacement Ascending Aorta (St. Jude mechanical prosthesis)  . APPENDECTOMY  child 1950  . BREAST SURGERY Left 2004  . CARDIAC CATHETERIZATION  02-22-2001   dr Martinique   minimal non-obstructive cad/  mild aortic stenosis and aortic root enlargement/  normal LVF, ef 65%  . CARDIAC CATHETERIZATION  03-22-2006    dr Martinique   no significant obstructive cad/  severe aortic stenosis  and mild to moderate aortic root enlargement/  upper normal right heart pressures  . CARDIOVASCULAR STRESS TEST  09-30-2011   dr Martinique   lexiscan nuclear study w/ apical thinning  vs  small prior apical infarct w/ no significant ischemia/  hypokinesis of the distal septum and apex, ef 50%  . CARPAL TUNNEL RELEASE Bilateral left 09-09-2004/  right 2007  . CATARACT EXTRACTION W/ INTRAOCULAR LENS  IMPLANT, BILATERAL  2015  . COLONOSCOPY N/A 10/03/2014   Procedure: COLONOSCOPY;  Surgeon: [name], MD;  Location: [facility];  Service: Endoscopy;  Laterality: N/A;  . CYSTOSCOPY WITH RETROGRADE PYELOGRAM, URETEROSCOPY AND STENT PLACEMENT Right 09/17/2016   Procedure: CYSTOSCOPY WITH RIGHT RETROGRADE PYELOGRAM, RIGHT  URETEROSCOPY AND STENT PLACEMENT LASER LITHOTRIPSY, RIGHT STENT PLACEMENT;  Surgeon: [name], MD;  Location: [facility];  Service: Urology;  Laterality: Right;  . ESOPHAGEAL MANOMETRY N/A 08/12/2014   Procedure: ESOPHAGEAL MANOMETRY (EM);  Surgeon: [name], MD;  [facility];  Service: Endoscopy;  Laterality: N/A;  . ESOPHAGOGASTRODUODENOSCOPY N/A 10/03/2014   Procedure: ESOPHAGOGASTRODUODENOSCOPY (EGD);  Surgeon: [name], MD;  Location: [facility] ENDOSCOPY;  Service: Endoscopy;  Laterality: N/A;  . [place]  . FOOT SURGERY Right 2013   hammertoe x2  . HOLMIUM LASER APPLICATION Right 2/56/3893   Procedure: HOLMIUM LASER APPLICATION;  Surgeon: [name], MD;  Location: [facility];  Service: Urology;  Laterality: Right;  . PERCUTANEOUS NEPHROSTOLITHOTOMY  1993  . STONE EXTRACTION WITH BASKET Right 09/17/2016   Procedure: STONE EXTRACTION WITH BASKET;  Surgeon: [name], MD;  Location: [facility];  Service: Urology;  Laterality: Right;  . TOTAL ABDOMINAL HYSTERECTOMY  1971   w/ Bilateral Salpingoophorectomy  . TRANSTHORACIC ECHOCARDIOGRAM  10/24/2014   dr Martinique   hypokinesis of the basal and mid inferoseptal and inferior walls,  ef 40-45%,  grade 1 diastolic dysfunction/  mechanical bileaflet aortic valve noted w/ mild regur., peak grandient 49mHg, valve area 1.35cm^2/  severe MV calcification without stenosis w/ mild regurg., peak gradient 34mg/  mild LAE/  poss. atrial mass (MRI showed benign lipomatous hypertrophy of atrial septum) /tFrazier ButtR/mild TR    There were no vitals filed for this visit.      Subjective Assessment - 02/24/17 1644    Subjective  Pt reports that styling hair and sitting on toliet is easier   Pertinent History PSP/Parkinsonism diagnosis 03/2016; hx L breast CA (s/p chemo, radiation, mastectomy,and reconstruction), hx of multiple falls, aortic  valve replacement, HTN, a-fib, GERD (see epic for details).   Limitations fall risk   Patient Stated Goals improve ADLs, writing   Currently in Pain? No/denies   Pain Onset Today      PWR! Moves (basic 4) in supine x 10-20 each with min cues For incr timing.  Recommended pt slow down, particularly with twist as going fast makes her dizzy.  Also recommended less reps for twist (x5 to each side) and reviewed use of large amplitude movement strategies for supine>sitting.  Pt performing PWR! Moves at home and declines handout.  Pt verbalized understanding of adaption for dizziness.  Arm bike x5m31mlevel 1 for reciprocal movement with cues/target of at least 40rpms for intensity while maintaining movement amplitude/reciprocal movement.   Pt maintained approx 40rpms.  In standing functional step and reach forward/back (and to the side) with each UE/LE with focus/min cueing for deliberate step and wt. Shift for improved balance (performed  with UE support).  Reaching to flip cards with large amplitude movements (supination/finger extension).  Picking up and stacking coins with each hand with min difficulty with min cues for timing.  Then manipulating in each hand to place in coin bank 1 at a time.  Simulated sitting/standing to/from toilet by practicing backing up and sitting on chair without arms.  Pt needed min cueing for sequencing technique for UE support and large amplitude steps when stepping back.  Practiced putting lid on/off water bottle with min cueing for large amplitude movements/timing.  Recommended pt try newer/sharper nail clippers for trimming toenails as pt reports difficulty pinching.                           OT Education - 02/24/17 1645    Education Details Reviewed PWR! moves in supine (see note for adaption)   Person(s) Educated Patient   Methods Explanation;Verbal cues;Demonstration   Comprehension Verbalized understanding;Returned demonstration;Verbal  cues required          OT Short Term Goals - 02/22/17 0820      OT SHORT TERM GOAL #1   Title Pt will be independent with updated HEP.--check STGs 02/19/17   Time 4   Period Weeks   Status On-going  02/23/17  needs reinforcement/cueing for large amplitude     OT SHORT TERM GOAL #2   Title Pt will report improved ability to cut food and decr spills when eating with adaptive strategies.   Time 4   Period Weeks   Status Achieved  02/22/17  per pt report     OT SHORT TERM GOAL #3   Title Pt will improve functional reaching/coordination for ADLs as shown by improving score on box and blocks test by at least 4 with dominant RUE.   Baseline R-40 blocks   Time 4   Period Weeks   Status On-going  02/22/17  R-40 blocks     OT SHORT TERM GOAL #4   Title Pt will write at least 2 sentences/grocery list with at least 75% legibility and only min decr in size.   Baseline 60% legibility, mod micrographia   Time 4   Period Weeks   Status Achieved  02/22/17  approx 90% legibility and min decr in size     OT SHORT TERM GOAL #5   Title ---------------           OT Long Term Goals - 02/22/17 0820      OT LONG TERM GOAL #1   Title Pt will verbalize understanding of adaptive strategies/AE for ADLs/IADLs to incr safety/ease/independence (including eating, buttoning, styling hair, writing, and sitting on toilet).--check LTGs 03/19/17   Time 8   Period Weeks   Status New     OT LONG TERM GOAL #2   Title Pt will improve coordination for ADLs as shown by reducing time on 9-hole peg test by at least 4 sec with dominant RUE.   Baseline R-28.47sec   Time 8   Period Weeks   Status New     OT LONG TERM GOAL #3   Title Pt will improve coordination/functional reaching as shown by improving box and blocks test by at least 8 with dominant RUE.   Baseline R-40, L-48 blocks   Time 8   Period Weeks   Status New     OT LONG TERM GOAL #4   Title Pt will improve functional reaching/balance for  IADLs as shown by improving standing  functional reach by at least 1" with LUE.   Baseline R-11", L-9"   Time 8   Period Weeks   Status New     OT LONG TERM GOAL #5   Title Pt will write at least 2 sentences/grocery list with at least 95% legibility and only min decr in size.   Baseline approx 60% legibility with mod micrographia   Time 8   Period Weeks   Status Revised  02/22/17  met initial goal of 90% and upgraded               Plan - 02/24/17 1640    Clinical Impression Statement Pt continues to progress towards goals with strategies for ADLs.  Pt reports incr ease with ADLs.   Rehab Potential Good   OT Frequency 2x / week   OT Duration 8 weeks   Plan continue with adaptive strategies/safety for ADLs (simulate trimming toenails), review/update coordination HEP   OT Home Exercise Plan Education provided:  reviewed PWR! sitting (basic 4), strategies for cutting/ eating, PWR! moves in supine (with modification)   Consulted and Agree with Plan of Care Patient      Patient will benefit from skilled therapeutic intervention in order to improve the following deficits and impairments:  Decreased balance, Decreased knowledge of precautions, Decreased safety awareness, Impaired UE functional use, Impaired tone, Improper body mechanics, Decreased mobility, Decreased coordination, Decreased cognition, Decreased knowledge of use of DME, Impaired vision/preception  Visit Diagnosis: Other symptoms and signs involving the nervous system  Other symptoms and signs involving the musculoskeletal system  Other lack of coordination  Frontal lobe and executive function deficit  Unsteadiness on feet  Other abnormalities of gait and mobility  Abnormal posture  Visuospatial deficit    Problem List Patient Active Problem List   Diagnosis Date Noted  . Elevated troponin 09/19/2016  . Emesis 09/19/2016  . UTI (urinary tract infection) 09/19/2016  . Sinus bradycardia 09/16/2016  .  Family history of colon cancer 07/30/2014  . Hiatal hernia 07/30/2014  . Chest pain 10/13/2011  . Disequilibrium 10/13/2011  . Heart valve replaced by other means 04/16/2011  . Chronic anticoagulation 03/17/2011  . S/P AVR (aortic valve replacement)   . PVC's (premature ventricular contractions)   . HTN (hypertension)   . Hypercholesteremia   . Atrial fibrillation with RVR ([place])   . GERD (gastroesophageal reflux disease)   . GERD 02/06/2010  . DIVERTICULOSIS-COLON 02/06/2010  . PERSONAL HX COLONIC POLYPS 02/06/2010  . MASTECTOMY, HX OF 02/06/2010    [facility] 02/24/2017, 4:46 PM  [facility] [address] [place], [place], [address] Phone: [phone]   Fax:  [phone]  Name: STELLAH DONOVAN MRN: [id number] Date of Birth: 07-Apr-1941   Vianne Bulls, OTR/L [facility] [address] [place], [place]  [address] [phone] phone [phone] 02/24/17 4:46 PM

## 2017-02-24 NOTE — Patient Instructions (Signed)
  Please complete the assigned speech therapy homework prior to your next session and return it to the speech therapist at your next visit.  

## 2017-03-01 ENCOUNTER — Ambulatory Visit: Payer: PPO

## 2017-03-01 ENCOUNTER — Ambulatory Visit: Payer: PPO | Admitting: Occupational Therapy

## 2017-03-01 DIAGNOSIS — R278 Other lack of coordination: Secondary | ICD-10-CM

## 2017-03-01 DIAGNOSIS — R29898 Other symptoms and signs involving the musculoskeletal system: Secondary | ICD-10-CM

## 2017-03-01 DIAGNOSIS — R2689 Other abnormalities of gait and mobility: Secondary | ICD-10-CM

## 2017-03-01 DIAGNOSIS — R471 Dysarthria and anarthria: Secondary | ICD-10-CM | POA: Diagnosis not present

## 2017-03-01 DIAGNOSIS — R293 Abnormal posture: Secondary | ICD-10-CM

## 2017-03-01 DIAGNOSIS — R41842 Visuospatial deficit: Secondary | ICD-10-CM

## 2017-03-01 DIAGNOSIS — R2681 Unsteadiness on feet: Secondary | ICD-10-CM

## 2017-03-01 DIAGNOSIS — R29818 Other symptoms and signs involving the nervous system: Secondary | ICD-10-CM

## 2017-03-01 DIAGNOSIS — R41844 Frontal lobe and executive function deficit: Secondary | ICD-10-CM

## 2017-03-01 NOTE — Therapy (Signed)
[facility] [address] [place], [place], [address] Phone: [phone]   Fax:  [phone]  Occupational Therapy Treatment  Patient Details  Name: [name] MRN: [id number] Date of Birth: 1941/08/10 Referring Provider: Dr. Wells Guiles Tat  Encounter Date: 03/01/2017      OT End of Session - 03/01/17 0903    Visit Number 6   Number of Visits 17   Date for OT Re-Evaluation 03/19/17   Authorization Type [employer], no visit limit/no auth, G-code needed   Authorization Time Period scheduled through 03/10/17 currently   Authorization - Visit Number 6   Authorization - Number of Visits 10   OT Start Time 0849   OT Stop Time 0930   OT Time Calculation (min) 41 min   Activity Tolerance Patient tolerated treatment well   Behavior During Therapy [facility] for tasks assessed/performed      Past Medical History:  Diagnosis Date  . Anticoagulated on Coumadin   . Arthropathy of cervical facet joint    C2-3  . At risk for falls   . BPV (benign positional vertigo)   . Bruises easily   . Cancer ([place]) 2004    breast-lumpectomy,nodes ,radiation,chemo  . Chronic neck pain   . CKD (chronic kidney disease), stage II   . Diverticulosis of colon   . Elevated troponin    a. 08/2016 in setting of PAF;  b. 08/2016 low risk MV w/ fixed septal defect (LBBB), EF 53%.  Marland Kitchen GERD (gastroesophageal reflux disease)   . Hiatal hernia   . History of aortic valve stenosis   . History of breast cancer per pt no recurrence   dx 2004-- Left breast DCIS (ER/PR+, HER2 negative) s/p  partial masectomy w/ sln bx and  chemoradiotion (completed 2004)  . History of colon polyps   . History of herpes zoster   . History of kidney stones   . History of kidney stones   . HTN (hypertension)   . Hyperlipidemia   . Hypertrophy of inter-atrial septum    a. 09/2014 Echo: ? of atrial mass; b. 10/2014 Cardiac MRI: no atrial septal mass - polypoid lipomatous  hypertrophy of superior atrial septum noted-->Benign.  . Irritable bowel syndrome   . LBBB (left bundle branch block)   . PAF (paroxysmal atrial fibrillation) (HCC)    a. CHA2DS2VASc = 4-->coumadin.  . Parkinsonism [facility])    neurologist-  dr tat  . PONV (postoperative nausea and vomiting)   . PVC's (premature ventricular contractions)   . Right ureteral stone   . S/P aortic valve replacement with prosthetic valve    a. 04/13/2006 s/p St Jude mechnical AVR for severe AS;  b. 09/2014 Echo: EF 40-45%, gr1 DD, mild AI/MR, triv TR/PR.  . SUI (stress urinary incontinence, female)   . Unstable balance    uses cane  . Venous varices      Past Surgical History:  Procedure Laterality Date  . ANTERIOR AND POSTERIOR REPAIR  1990   cystocele/ rectocele  . AORTIC VALVE REPLACEMENT  04/13/06   dr gerhardt   and Replacement Ascending Aorta (St. Jude mechanical prosthesis)  . APPENDECTOMY  child 1950  . BREAST SURGERY Left 2004  . CARDIAC CATHETERIZATION  02-22-2001   dr Martinique   minimal non-obstructive cad/  mild aortic stenosis and aortic root enlargement/  normal LVF, ef 65%  . CARDIAC CATHETERIZATION  03-22-2006    dr Martinique   no significant obstructive cad/  severe aortic stenosis  and mild to moderate aortic root enlargement/  upper normal right heart pressures  . CARDIOVASCULAR STRESS TEST  09-30-2011   dr Martinique   lexiscan nuclear study w/ apical thinning  vs  small prior apical infarct w/ no significant ischemia/  hypokinesis of the distal septum and apex, ef 50%  . CARPAL TUNNEL RELEASE Bilateral left 09-09-2004/  right 2007  . CATARACT EXTRACTION W/ INTRAOCULAR LENS  IMPLANT, BILATERAL  2015  . COLONOSCOPY N/A 10/03/2014   Procedure: COLONOSCOPY;  Surgeon: [name], MD;  Location: [facility];  Service: Endoscopy;  Laterality: N/A;  . CYSTOSCOPY WITH RETROGRADE PYELOGRAM, URETEROSCOPY AND STENT PLACEMENT Right 09/17/2016   Procedure: CYSTOSCOPY WITH RIGHT RETROGRADE PYELOGRAM, RIGHT  URETEROSCOPY AND STENT PLACEMENT LASER LITHOTRIPSY, RIGHT STENT PLACEMENT;  Surgeon: [name], MD;  Location[facility];  Service: Urology;  Laterality: Right;  . ESOPHAGEAL MANOMETRY N/A 08/12/2014   Procedure: ESOPHAGEAL MANOMETRY (EM);  Surgeon: [name], MD;  Location: [facility];  Service: Endoscopy;  Laterality: N/A;  . ESOPHAGOGASTRODUODENOSCOPY N/A 10/03/2014   Procedure: ESOPHAGOGASTRODUODENOSCOPY (EGD);  Surgeon: [name], MD;  Location: [facility];  Service: Endoscopy;  Laterality: N/A;  . [place]  . FOOT SURGERY Right 2013   hammertoe x2  . HOLMIUM LASER APPLICATION Right 4/96/7591   Procedure: HOLMIUM LASER APPLICATION;  Surgeon: [name], MD;  Location: [facility];  Service: Urology;  Laterality: Right;  . PERCUTANEOUS NEPHROSTOLITHOTOMY  1993  . STONE EXTRACTION WITH BASKET Right 09/17/2016   Procedure: STONE EXTRACTION WITH BASKET;  Surgeon: [name], MD;  Location: [facility];  Service: Urology;  Laterality: Right;  . TOTAL ABDOMINAL HYSTERECTOMY  1971   w/ Bilateral Salpingoophorectomy  . TRANSTHORACIC ECHOCARDIOGRAM  10/24/2014   dr Martinique   hypokinesis of the basal and mid inferoseptal and inferior walls,  ef 40-45%,  grade 1 diastolic dysfunction/  mechanical bileaflet aortic valve noted w/ mild regur., peak grandient 64mHg, valve area 1.35cm^2/  severe MV calcification without stenosis w/ mild regurg., peak gradient 332mg/  mild LAE/  poss. atrial mass (MRI showed benign lipomatous hypertrophy of atrial septum) /tFrazier ButtR/mild TR    There were no vitals filed for this visit.      Subjective Assessment - 03/01/17 0853    Subjective  Pt reports that she continues to perform vision HEP from when she had OT last time   Pertinent History PSP/Parkinsonism diagnosis 03/2016; hx L breast CA (s/p chemo, radiation, mastectomy,and reconstruction), hx of  multiple falls, aortic valve replacement, HTN, a-fib, GERD (see epic for details).   Limitations fall risk   Patient Stated Goals improve ADLs, writing   Currently in Pain? No/denies   Pain Onset Today          Simulated trimming nails by propping foot up and placing clothespins on horizontal poles for coordination, pinch strength and incr core stability with min difficulty.  Performed with each foot and RUE.                    OT Education - 03/01/17 0901    Education Details Coordination HEP (Review and re-issue)   Person(s) Educated Patient   Methods Explanation;Demonstration;Verbal cues   Comprehension Verbalized understanding;Returned demonstration;Verbal cues required  for slow deliberate, large amplitude movements          OT Short Term Goals - 02/22/17 0820      OT SHORT TERM GOAL #  1   Title Pt will be independent with updated HEP.--check STGs 02/19/17   Time 4   Period Weeks   Status On-going  02/23/17  needs reinforcement/cueing for large amplitude     OT SHORT TERM GOAL #2   Title Pt will report improved ability to cut food and decr spills when eating with adaptive strategies.   Time 4   Period Weeks   Status Achieved  02/22/17  per pt report     OT SHORT TERM GOAL #3   Title Pt will improve functional reaching/coordination for ADLs as shown by improving score on box and blocks test by at least 4 with dominant RUE.   Baseline R-40 blocks   Time 4   Period Weeks   Status On-going  02/22/17  R-40 blocks     OT SHORT TERM GOAL #4   Title Pt will write at least 2 sentences/grocery list with at least 75% legibility and only min decr in size.   Baseline 60% legibility, mod micrographia   Time 4   Period Weeks   Status Achieved  02/22/17  approx 90% legibility and min decr in size     OT SHORT TERM GOAL #5   Title ---------------           OT Long Term Goals - 02/22/17 0820      OT LONG TERM GOAL #1   Title Pt will verbalize  understanding of adaptive strategies/AE for ADLs/IADLs to incr safety/ease/independence (including eating, buttoning, styling hair, writing, and sitting on toilet).--check LTGs 03/19/17   Time 8   Period Weeks   Status New     OT LONG TERM GOAL #2   Title Pt will improve coordination for ADLs as shown by reducing time on 9-hole peg test by at least 4 sec with dominant RUE.   Baseline R-28.47sec   Time 8   Period Weeks   Status New     OT LONG TERM GOAL #3   Title Pt will improve coordination/functional reaching as shown by improving box and blocks test by at least 8 with dominant RUE.   Baseline R-40, L-48 blocks   Time 8   Period Weeks   Status New     OT LONG TERM GOAL #4   Title Pt will improve functional reaching/balance for IADLs as shown by improving standing functional reach by at least 1" with LUE.   Baseline R-11", L-9"   Time 8   Period Weeks   Status New     OT LONG TERM GOAL #5   Title Pt will write at least 2 sentences/grocery list with at least 95% legibility and only min decr in size.   Baseline approx 60% legibility with mod micrographia   Time 8   Period Weeks   Status Revised  02/22/17  met initial goal of 90% and upgraded               Plan - 03/01/17 0904    Rehab Potential Good   OT Frequency 2x / week   OT Duration 8 weeks   Plan continue with strategies for ADLs, functional reaching, coordination   OT Home Exercise Plan Education provided:  reviewed PWR! sitting (basic 4), strategies for cutting/ eating, PWR! moves in supine (with modification)   Consulted and Agree with Plan of Care Patient      Patient will benefit from skilled therapeutic intervention in order to improve the following deficits and impairments:  Decreased balance, Decreased knowledge of precautions,  Decreased safety awareness, Impaired UE functional use, Impaired tone, Improper body mechanics, Decreased mobility, Decreased coordination, Decreased cognition, Decreased  knowledge of use of DME, Impaired vision/preception  Visit Diagnosis: Other symptoms and signs involving the nervous system  Other symptoms and signs involving the musculoskeletal system  Other lack of coordination  Frontal lobe and executive function deficit  Unsteadiness on feet  Other abnormalities of gait and mobility  Abnormal posture  Visuospatial deficit    Problem List Patient Active Problem List   Diagnosis Date Noted  . Elevated troponin 09/19/2016  . Emesis 09/19/2016  . UTI (urinary tract infection) 09/19/2016  . Sinus bradycardia 09/16/2016  . Family history of colon cancer 07/30/2014  . Hiatal hernia 07/30/2014  . Chest pain 10/13/2011  . Disequilibrium 10/13/2011  . Heart valve replaced by other means 04/16/2011  . Chronic anticoagulation 03/17/2011  . S/P AVR (aortic valve replacement)   . PVC's (premature ventricular contractions)   . HTN (hypertension)   . Hypercholesteremia   . Atrial fibrillation with RVR ([place])   . GERD (gastroesophageal reflux disease)   . GERD 02/06/2010  . DIVERTICULOSIS-COLON 02/06/2010  . PERSONAL HX COLONIC POLYPS 02/06/2010  . MASTECTOMY, HX OF 02/06/2010    [facility] 03/01/2017, 9:06 AM  [facility] [address] [place], [place], [address] Phone: [phone]   Fax:  [phone]  Name: [name] MRN: [id number] Date of Birth: 1941-08-17   Vianne Bulls, OTR/L [facility] [address] [place], [place]  [address] [phone] phone [phone] 03/01/17 9:29 AM

## 2017-03-01 NOTE — Patient Instructions (Signed)
  Please complete the assigned speech therapy homework prior to your next session and return it to the speech therapist at your next visit.  

## 2017-03-01 NOTE — Patient Instructions (Addendum)
Coordination Exercises  Perform the following exercises for 20 minutes 1 times per day. Perform with both hand(s). Perform using big movements.   Flipping Cards: Place deck of cards on the table. Flip cards over by opening your hand big to grasp and then turn your palm up big, opening hand fully to release.  Go slow!!  Deal cards: Hold 1/2 or whole deck in your hand. Bring your thumb back and Use thumb to push card off top of deck with one big push (push down and off).  Slow and Big!  Rotate ball with fingertips: Pick up with fingers/thumb and move as much as you can with each turn/movement (clockwise and counter-clockwise).  Toss ball from one hand to the other: Toss big/high.  Deliberately open with toss and deliberately close hand after catch and pause before tossing again.  Toss ball in the air and catch with the same hand: Toss big/high.  Deliberately open with toss and deliberately close hand after catch and pause before tossing again.  Pick up coins and stack one at a time: Pick up with big, intentional movements. Do not drag coin to the edge. (5-10 in a stack).  Go slow  Pick up 5-10 coins one at a time and hold in palm. Then, move coins from palm to fingertips one at time and place in coin bank/container.  Go slow and deliberate.  Practice writing: Slow down, write big, and focus on forming each letter.  Fasten nuts/bolts or put on bottle caps: Turn as much/as big as you can with each turn and then open hand before turning again. Move wrist.       Visual Exercises:  Perform at least 1-2 times per day. Stop if your eye becomes fatigued or hurts and try again later.  1. Hold a small object/card in front of you.  Hold it in the middle at arm's length away.    2. Slowly move the object side to side in front of you while continuing to watch it with your eyes, do not move your head .  3.  Remember to keep your head still and only move your eyes.  4.  Repeat 5-10  times.  5.  Then, move object up and down while watching it 5-10 times.  6.  If image starts to double stop and try to make image one if possible before continuing.

## 2017-03-01 NOTE — Therapy (Signed)
[facility] [address] [place], [place], [address] Phone: [phone]   Fax:  [phone]  Speech Language Pathology Treatment  Patient Details  Name: [name] MRN: [id number] Date of Birth: 1941/05/16 Referring Provider: Alonza Bogus DO  Encounter Date: 03/01/2017      End of Session - 03/01/17 0944    Visit Number 6   Number of Visits 17   Date for SLP Re-Evaluation 03/25/17   SLP Start Time 0809   SLP Stop Time  0846   SLP Time Calculation (min) 37 min   Activity Tolerance Patient tolerated treatment well      Past Medical History:  Diagnosis Date  . Anticoagulated on Coumadin   . Arthropathy of cervical facet joint    C2-3  . At risk for falls   . BPV (benign positional vertigo)   . Bruises easily   . Cancer ([place]) 2004    breast-lumpectomy,nodes ,radiation,chemo  . Chronic neck pain   . CKD (chronic kidney disease), stage II   . Diverticulosis of colon   . Elevated troponin    a. 08/2016 in setting of PAF;  b. 08/2016 low risk MV w/ fixed septal defect (LBBB), EF 53%.  Marland Kitchen GERD (gastroesophageal reflux disease)   . Hiatal hernia   . History of aortic valve stenosis   . History of breast cancer per pt no recurrence   dx 2004-- Left breast DCIS (ER/PR+, HER2 negative) s/p  partial masectomy w/ sln bx and  chemoradiotion (completed 2004)  . History of colon polyps   . History of herpes zoster   . History of kidney stones   . History of kidney stones   . HTN (hypertension)   . Hyperlipidemia   . Hypertrophy of inter-atrial septum    a. 09/2014 Echo: ? of atrial mass; b. 10/2014 Cardiac MRI: no atrial septal mass - polypoid lipomatous hypertrophy of superior atrial septum noted-->Benign.  . Irritable bowel syndrome   . LBBB (left bundle branch block)   . PAF (paroxysmal atrial fibrillation) (HCC)    a. CHA2DS2VASc = 4-->coumadin.  . Parkinsonism [facility])    neurologist-  dr tat  . PONV  (postoperative nausea and vomiting)   . PVC's (premature ventricular contractions)   . Right ureteral stone   . S/P aortic valve replacement with prosthetic valve    a. 04/13/2006 s/p St Jude mechnical AVR for severe AS;  b. 09/2014 Echo: EF 40-45%, gr1 DD, mild AI/MR, triv TR/PR.  . SUI (stress urinary incontinence, female)   . Unstable balance    uses cane  . Venous varices      Past Surgical History:  Procedure Laterality Date  . ANTERIOR AND POSTERIOR REPAIR  1990   cystocele/ rectocele  . AORTIC VALVE REPLACEMENT  04/13/06   dr gerhardt   and Replacement Ascending Aorta (St. Jude mechanical prosthesis)  . APPENDECTOMY  child 1950  . BREAST SURGERY Left 2004  . CARDIAC CATHETERIZATION  02-22-2001   dr Martinique   minimal non-obstructive cad/  mild aortic stenosis and aortic root enlargement/  normal LVF, ef 65%  . CARDIAC CATHETERIZATION  03-22-2006    dr Martinique   no significant obstructive cad/  severe aortic stenosis and mild to moderate aortic root enlargement/  upper normal right heart pressures  . CARDIOVASCULAR STRESS TEST  09-30-2011   dr Martinique   lexiscan nuclear study w/ apical thinning  vs  small prior apical infarct w/ no significant ischemia/  hypokinesis of the distal septum and apex, ef 50%  . CARPAL TUNNEL RELEASE Bilateral left 09-09-2004/  right 2007  . CATARACT EXTRACTION W/ INTRAOCULAR LENS  IMPLANT, BILATERAL  2015  . COLONOSCOPY N/A 10/03/2014   Procedure: COLONOSCOPY;  Surgeon: [name], MD;  Location: [facility];  Service: Endoscopy;  Laterality: N/A;  . CYSTOSCOPY WITH RETROGRADE PYELOGRAM, URETEROSCOPY AND STENT PLACEMENT Right 09/17/2016   Procedure: CYSTOSCOPY WITH RIGHT RETROGRADE PYELOGRAM, RIGHT URETEROSCOPY AND STENT PLACEMENT LASER LITHOTRIPSY, RIGHT STENT PLACEMENT;  Surgeon: [name], MD;  Location: [facility];  Service: Urology;  Laterality: Right;  . ESOPHAGEAL MANOMETRY N/A 08/12/2014   Procedure: ESOPHAGEAL MANOMETRY  (EM);  Surgeon: [name], MD;  Location: [facility];  Service: Endoscopy;  Laterality: N/A;  . ESOPHAGOGASTRODUODENOSCOPY N/A 10/03/2014   Procedure: ESOPHAGOGASTRODUODENOSCOPY (EGD);  Surgeon: [name], MD;  Location: [facility] END[facility]COPY;  Service: Endoscopy;  Laterality: N/A;  . [place]  . FOOT SURGERY Right 2013   hammertoe x2  . HOLMIUM LASER APPLICATION Right 01/10/7261   Procedure: HOLMIUM LASER APPLICATION;  Surgeon: [name], MD;  Location[facility];  Service: Urology;  Laterality: Right;  . PERCUTANEOUS NEPHROSTOLITHOTOMY  1993  . STONE EXTRACTION WITH BASKET Right 09/17/2016   Procedure: STONE EXTRACTION WITH BASKET;  Surgeon: [name], MD;  Location: [facility];  Service: Urology;  Laterality: Right;  . TOTAL ABDOMINAL HYSTERECTOMY  1971   w/ Bilateral Salpingoophorectomy  . TRANSTHORACIC ECHOCARDIOGRAM  10/24/2014   dr Martinique   hypokinesis of the basal and mid inferoseptal and inferior walls,  ef 40-45%,  grade 1 diastolic dysfunction/  mechanical bileaflet aortic valve noted w/ mild regur., peak grandient 12mHg, valve area 1.35cm^2/  severe MV calcification without stenosis w/ mild regurg., peak gradient 340mg/  mild LAE/  poss. atrial mass (MRI showed benign lipomatous hypertrophy of atrial septum) /tFrazier ButtR/mild TR    There were no vitals filed for this visit.      Subjective Assessment - 03/01/17 0815    Subjective "I've been talking louder!"   Currently in Pain? No/denies               ADULT SLP TREATMENT - 03/01/17 0815      General Information   Behavior/Cognition Alert;Cooperative;Pleasant mood     Treatment Provided   Treatment provided Cognitive-Linquistic     Cognitive-Linquistic Treatment   Treatment focused on Dysarthria   Skilled Treatment SLP facilitated pt's loudness recalibration with loud /a/ average 92dB with rare min A for maintaining loudness.  In sentence level responses with object description pt maintained 70dB average, and in multi-sentence responses pt maintained average 70dB independentlyi. In conversation pt also maintained 70dB with rare min A.      Assessment / Recommendations / Plan   Plan Continue with current plan of care     Progression Toward Goals   Progression toward goals Progressing toward goals            SLP Short Term Goals - 03/01/17 0947      SLP SHORT TERM GOAL #1   Title pt will produce loud /a/ with proper voicing at average low 90's dB over three sessions   Baseline 02-24-17, 3-6-218   Time 3   Period --  visits   Status On-going     SLP SHORT TERM GOAL #2   Title pt will converse for 10 minutes re: simple topic with appropriate  breath support for average volume in low 70s dB.   Time 3   Period --  visits   Status On-going     SLP SHORT TERM GOAL #3   Title pt will exhibit no greater than 2 episodes of rushing of speech in 10 minutes simple conversation over three sessions   Baseline 03-01-17   Time 3   Period --  vistis   Status On-going          SLP Long Term Goals - 03/01/17 0948      SLP LONG TERM GOAL #1   Title pt will demo no more than one episode of rushing of speech in 15 minutes mod complex conversation over two sessions   Time 4   Period Weeks   Status On-going     SLP LONG TERM GOAL #2   Title pt will achieve 15 minutes mod complex conversation with appropriate breath support to allow no > 1 instance of speaking on residual air   Time 4   Period Weeks   Status On-going     SLP LONG TERM GOAL #3   Title pt will sustain /a/ at average low 90s dB over 6 sessions   Time 4   Period Weeks   Status On-going          Plan - 03/01/17 0945    Clinical Impression Statement Pt maintained WNL loudness in sentence, multisentence and conversation today, with most frequent cues necessary in conversation. Short rushes of speech x3 today. Continue skilled ST to maximize  intelligiblity.    Speech Therapy Frequency 2x / week   Duration --  8 weeks   Treatment/Interventions Compensatory strategies;Functional tasks;Patient/family education;Cueing hierarchy;Internal/external aids;SLP instruction and feedback   Potential to Achieve Goals Good      Patient will benefit from skilled therapeutic intervention in order to improve the following deficits and impairments:   Dysarthria and anarthria    Problem List Patient Active Problem List   Diagnosis Date Noted  . Elevated troponin 09/19/2016  . Emesis 09/19/2016  . UTI (urinary tract infection) 09/19/2016  . Sinus bradycardia 09/16/2016  . Family history of colon cancer 07/30/2014  . Hiatal hernia 07/30/2014  . Chest pain 10/13/2011  . Disequilibrium 10/13/2011  . Heart valve replaced by other means 04/16/2011  . Chronic anticoagulation 03/17/2011  . S/P AVR (aortic valve replacement)   . PVC's (premature ventricular contractions)   . HTN (hypertension)   . Hypercholesteremia   . Atrial fibrillation with RVR ([place])   . GERD (gastroesophageal reflux disease)   . GERD 02/06/2010  . DIVERTICULOSIS-COLON 02/06/2010  . PERSONAL HX COLONIC POLYPS 02/06/2010  . MASTECTOMY, HX OF 02/06/2010    [facility] ,MS, CCC-SLP  03/01/2017, 9:50 AM  [facility] [address] [place], [place], [address] Phone: [phone]   Fax:  [phone]   Name: TAE ROBAK MRN: [id number] Date of Birth: 1941-06-14

## 2017-03-03 ENCOUNTER — Ambulatory Visit: Payer: PPO | Admitting: Speech Pathology

## 2017-03-03 ENCOUNTER — Ambulatory Visit: Payer: PPO | Admitting: Occupational Therapy

## 2017-03-03 DIAGNOSIS — R471 Dysarthria and anarthria: Secondary | ICD-10-CM | POA: Diagnosis not present

## 2017-03-03 DIAGNOSIS — R278 Other lack of coordination: Secondary | ICD-10-CM

## 2017-03-03 DIAGNOSIS — R2689 Other abnormalities of gait and mobility: Secondary | ICD-10-CM

## 2017-03-03 DIAGNOSIS — R41842 Visuospatial deficit: Secondary | ICD-10-CM

## 2017-03-03 DIAGNOSIS — R29818 Other symptoms and signs involving the nervous system: Secondary | ICD-10-CM

## 2017-03-03 DIAGNOSIS — R2681 Unsteadiness on feet: Secondary | ICD-10-CM

## 2017-03-03 DIAGNOSIS — R293 Abnormal posture: Secondary | ICD-10-CM

## 2017-03-03 DIAGNOSIS — R41844 Frontal lobe and executive function deficit: Secondary | ICD-10-CM

## 2017-03-03 DIAGNOSIS — R29898 Other symptoms and signs involving the musculoskeletal system: Secondary | ICD-10-CM

## 2017-03-03 NOTE — Therapy (Signed)
[facility] [address] [place], [place], [address] Phone: [phone]   Fax:  [phone]  Speech Language Pathology Treatment  Patient Details  Name: [name] MRN: [id number] Date of Birth: 03-15-1941 Referring Provider: Alonza Bogus DO  Encounter Date: 03/03/2017      End of Session - 03/03/17 0930    Visit Number 7   Number of Visits 17   Date for SLP Re-Evaluation 03/25/17   SLP Start Time 0850   SLP Stop Time  0931   SLP Time Calculation (min) 41 min   Activity Tolerance Patient tolerated treatment well      Past Medical History:  Diagnosis Date  . Anticoagulated on Coumadin   . Arthropathy of cervical facet joint    C2-3  . At risk for falls   . BPV (benign positional vertigo)   . Bruises easily   . Cancer ([place]) 2004    breast-lumpectomy,nodes ,radiation,chemo  . Chronic neck pain   . CKD (chronic kidney disease), stage II   . Diverticulosis of colon   . Elevated troponin    a. 08/2016 in setting of PAF;  b. 08/2016 low risk MV w/ fixed septal defect (LBBB), EF 53%.  Marland Kitchen GERD (gastroesophageal reflux disease)   . Hiatal hernia   . History of aortic valve stenosis   . History of breast cancer per pt no recurrence   dx 2004-- Left breast DCIS (ER/PR+, HER2 negative) s/p  partial masectomy w/ sln bx and  chemoradiotion (completed 2004)  . History of colon polyps   . History of herpes zoster   . History of kidney stones   . History of kidney stones   . HTN (hypertension)   . Hyperlipidemia   . Hypertrophy of inter-atrial septum    a. 09/2014 Echo: ? of atrial mass; b. 10/2014 Cardiac MRI: no atrial septal mass - polypoid lipomatous hypertrophy of superior atrial septum noted-->Benign.  . Irritable bowel syndrome   . LBBB (left bundle branch block)   . PAF (paroxysmal atrial fibrillation) (HCC)    a. CHA2DS2VASc = 4-->coumadin.  . Parkinsonism [facility])    neurologist-  dr tat  . PONV  (postoperative nausea and vomiting)   . PVC's (premature ventricular contractions)   . Right ureteral stone   . S/P aortic valve replacement with prosthetic valve    a. 04/13/2006 s/p St Jude mechnical AVR for severe AS;  b. 09/2014 Echo: EF 40-45%, gr1 DD, mild AI/MR, triv TR/PR.  . SUI (stress urinary incontinence, female)   . Unstable balance    uses cane  . Venous varices      Past Surgical History:  Procedure Laterality Date  . ANTERIOR AND POSTERIOR REPAIR  1990   cystocele/ rectocele  . AORTIC VALVE REPLACEMENT  04/13/06   dr gerhardt   and Replacement Ascending Aorta (St. Jude mechanical prosthesis)  . APPENDECTOMY  child 1950  . BREAST SURGERY Left 2004  . CARDIAC CATHETERIZATION  02-22-2001   dr Martinique   minimal non-obstructive cad/  mild aortic stenosis and aortic root enlargement/  normal LVF, ef 65%  . CARDIAC CATHETERIZATION  03-22-2006    dr Martinique   no significant obstructive cad/  severe aortic stenosis and mild to moderate aortic root enlargement/  upper normal right heart pressures  . CARDIOVASCULAR STRESS TEST  09-30-2011   dr Martinique   lexiscan nuclear study w/ apical thinning  vs  small prior apical infarct w/ no significant ischemia/  hypokinesis of the distal septum and apex, ef 50%  . CARPAL TUNNEL RELEASE Bilateral left 09-09-2004/  right 2007  . CATARACT EXTRACTION W/ INTRAOCULAR LENS  IMPLANT, BILATERAL  2015  . COLONOSCOPY N/A 10/03/2014   Procedure: COLONOSCOPY;  Surgeon: [name], MD;  Location: [place];  Service: Endoscopy;  Laterality: N/A;  . CYSTOSCOPY WITH RETROGRADE PYELOGRAM, URETEROSCOPY AND STENT PLACEMENT Right 09/17/2016   Procedure: CYSTOSCOPY WITH RIGHT RETROGRADE PYELOGRAM, RIGHT URETEROSCOPY AND STENT PLACEMENT LASER LITHOTRIPSY, RIGHT STENT PLACEMENT;  Surgeon: [name], MD;  Location[facility];  Service: Urology;  Laterality: Right;  . ESOPHAGEAL MANOMETRY N/A 08/12/2014   Procedure: ESOPHAGEAL MANOMETRY  (EM);  Surgeon: [name], MD;  Location: [facility];  Service: Endoscopy;  Laterality: N/A;  . ESOPHAGOGASTRODUODENOSCOPY N/A 10/03/2014   Procedure: ESOPHAGOGASTRODUODENOSCOPY (EGD);  Surgeon: [name], MD;  Location: [facility] ENDOSCOPY;  Service: Endoscopy;  Laterality: N/A;  . [place]  . FOOT SURGERY Right 2013   hammertoe x2  . HOLMIUM LASER APPLICATION Right 12/27/7508   Procedure: HOLMIUM LASER APPLICATION;  Surgeon: [name], MD;  Location[facility];  Service: Urology;  Laterality: Right;  . PERCUTANEOUS NEPHROSTOLITHOTOMY  1993  . STONE EXTRACTION WITH BASKET Right 09/17/2016   Procedure: STONE EXTRACTION WITH BASKET;  Surgeon: [name], MD;  Location: [facility];  Service: Urology;  Laterality: Right;  . TOTAL ABDOMINAL HYSTERECTOMY  1971   w/ Bilateral Salpingoophorectomy  . TRANSTHORACIC ECHOCARDIOGRAM  10/24/2014   dr Martinique   hypokinesis of the basal and mid inferoseptal and inferior walls,  ef 40-45%,  grade 1 diastolic dysfunction/  mechanical bileaflet aortic valve noted w/ mild regur., peak grandient 28mHg, valve area 1.35cm^2/  severe MV calcification without stenosis w/ mild regurg., peak gradient 343mg/  mild LAE/  poss. atrial mass (MRI showed benign lipomatous hypertrophy of atrial septum) /tFrazier ButtR/mild TR    There were no vitals filed for this visit.      Subjective Assessment - 03/03/17 0855    Subjective "CaGlendell Dockerays my voice is getting stronger."               ADULT SLP TREATMENT - 03/03/17 0856      General Information   Behavior/Cognition Alert;Cooperative;Pleasant mood     Treatment Provided   Treatment provided Cognitive-Linquistic     Pain Assessment   Pain Assessment 0-10   Pain Score 3    Pain Location left shoulder, neck   Pain Descriptors / Indicators Aching   Pain Intervention(s) Premedicated before session     Cognitive-Linquistic  Treatment   Treatment focused on Dysarthria   Skilled Treatment SLP facilitated pt's loudness recalibration with loud /a/ average 91.5 dB with rare min A for maintaining loudness. In sentence level responses with object description pt maintained 72dB average, and in multi-sentence responses pt maintained average 71.5 dB independently. In 11 min conversation pt also maintained 71dB with rare min A; pt with short rushes of speech x4 benefitted from feedback with metronome to slow rate, verbal cues for mouth opening.      Assessment / Recommendations / Plan   Plan Continue with current plan of care     Progression Toward Goals   Progression toward goals Progressing toward goals          SLP Education - 03/03/17 0929    Education provided Yes   Education Details slowing rate, mouth opening  Person(s) Educated Patient   Methods Explanation;Demonstration;Verbal cues   Comprehension Verbalized understanding;Returned demonstration          SLP Short Term Goals - 03/03/17 0932      SLP SHORT TERM GOAL #1   Title pt will produce loud /a/ with proper voicing at average low 90's dB over three sessions   Baseline 02-24-17, 3-6-218, 03/03/17   Time 3   Period Weeks   Status Achieved     SLP SHORT TERM GOAL #2   Title pt will converse for 10 minutes re: simple topic with appropriate breath support for average volume in low 70s dB.   Time 3   Period Weeks   Status On-going     SLP SHORT TERM GOAL #3   Title pt will exhibit no greater than 2 episodes of rushing of speech in 10 minutes simple conversation over three sessions   Baseline 03-01-17   Time 3   Period Weeks   Status On-going          SLP Long Term Goals - 03/03/17 1014      SLP LONG TERM GOAL #1   Title pt will demo no more than one episode of rushing of speech in 15 minutes mod complex conversation over two sessions   Time 4   Period Weeks   Status On-going     SLP LONG TERM GOAL #2   Title pt will achieve 15 minutes  mod complex conversation with appropriate breath support to allow no > 1 instance of speaking on residual air   Time 4   Period Weeks   Status On-going     SLP LONG TERM GOAL #3   Title pt will sustain /a/ at average low 90s dB over 6 sessions   Time 4   Period Weeks   Status On-going          Plan - 03/03/17 0930    Clinical Impression Statement Pt maintained WNL loudness in sentence, multisentence and conversation today, with most frequent cues necessary in conversation. Short rushes of speech x4 today, benefitted from auditory feedback and verbal cues. Continue skilled ST to maximize intelligiblity.    Speech Therapy Frequency 2x / week   Duration Other (comment)   Treatment/Interventions Compensatory strategies;Functional tasks;Patient/family education;Cueing hierarchy;Internal/external aids;SLP instruction and feedback   Potential to [place] continue as instructed   Consulted and Agree with Plan of Care Patient      Patient will benefit from skilled therapeutic intervention in order to improve the following deficits and impairments:   Dysarthria and anarthria    Problem List Patient Active Problem List   Diagnosis Date Noted  . Elevated troponin 09/19/2016  . Emesis 09/19/2016  . UTI (urinary tract infection) 09/19/2016  . Sinus bradycardia 09/16/2016  . Family history of colon cancer 07/30/2014  . Hiatal hernia 07/30/2014  . Chest pain 10/13/2011  . Disequilibrium 10/13/2011  . Heart valve replaced by other means 04/16/2011  . Chronic anticoagulation 03/17/2011  . S/P AVR (aortic valve replacement)   . PVC's (premature ventricular contractions)   . HTN (hypertension)   . Hypercholesteremia   . Atrial fibrillation with RVR ([place])   . GERD (gastroesophageal reflux disease)   . GERD 02/06/2010  . DIVERTICULOSIS-COLON 02/06/2010  . PERSONAL HX COLONIC POLYPS 02/06/2010  . MASTECTOMY, HX OF 02/06/2010   Deneise Lever, MS  CF-SLP Speech-Language Pathologist  Aliene Altes 03/03/2017, 10:15 AM  [facility]  [facility] [address] [place], [place], [address] Phone: [phone]   Fax:  [phone]   Name: [name] MRN: [id number] Date of Birth: Nov 22, 1941

## 2017-03-03 NOTE — Therapy (Signed)
[facility] [address] [place], [place], [address] Phone: [phone]   Fax:  [phone]  Occupational Therapy Treatment  Patient Details  Name: [name] MRN: [id number] Date of Birth: [birth year] Referring Provider: Dr. Wells Guiles Tat  Encounter Date: 03/03/2017      OT End of Session - 03/03/17 0811    Visit Number 7   Number of Visits 17   Date for OT Re-Evaluation 03/19/17   Authorization Type [employer], no visit limit/no auth, G-code needed   Authorization Time Period scheduled through 03/10/17 currently   Authorization - Visit Number 7   Authorization - Number of Visits 10   OT Start Time 0805   OT Stop Time 0845   OT Time Calculation (min) 40 min   Activity Tolerance Patient tolerated treatment well   Behavior During Therapy [facility] for tasks assessed/performed      Past Medical History:  Diagnosis Date  . Anticoagulated on Coumadin   . Arthropathy of cervical facet joint    C2-3  . At risk for falls   . BPV (benign positional vertigo)   . Bruises easily   . Cancer ([place]) 2004    breast-lumpectomy,nodes ,radiation,chemo  . Chronic neck pain   . CKD (chronic kidney disease), stage II   . Diverticulosis of colon   . Elevated troponin    a. 08/2016 in setting of PAF;  b. 08/2016 low risk MV w/ fixed septal defect (LBBB), EF 53%.  Marland Kitchen GERD (gastroesophageal reflux disease)   . Hiatal hernia   . History of aortic valve stenosis   . History of breast cancer per pt no recurrence   dx 2004-- Left breast DCIS (ER/PR+, HER2 negative) s/p  partial masectomy w/ sln bx and  chemoradiotion (completed 2004)  . History of colon polyps   . History of herpes zoster   . History of kidney stones   . History of kidney stones   . HTN (hypertension)   . Hyperlipidemia   . Hypertrophy of inter-atrial septum    a. 09/2014 Echo: ? of atrial mass; b. 10/2014 Cardiac MRI: no atrial septal mass - polypoid lipomatous  hypertrophy of superior atrial septum noted-->Benign.  . Irritable bowel syndrome   . LBBB (left bundle branch block)   . PAF (paroxysmal atrial fibrillation) (HCC)    a. CHA2DS2VASc = 4-->coumadin.  . Parkinsonism [facility])    neurologist-  dr tat  . PONV (postoperative nausea and vomiting)   . PVC's (premature ventricular contractions)   . Right ureteral stone   . S/P aortic valve replacement with prosthetic valve    a. 04/13/2006 s/p St Jude mechnical AVR for severe AS;  b. 09/2014 Echo: EF 40-45%, gr1 DD, mild AI/MR, triv TR/PR.  . SUI (stress urinary incontinence, female)   . Unstable balance    uses cane  . Venous varices      Past Surgical History:  Procedure Laterality Date  . ANTERIOR AND POSTERIOR REPAIR  1990   cystocele/ rectocele  . AORTIC VALVE REPLACEMENT  04/13/06   dr gerhardt   and Replacement Ascending Aorta (St. Jude mechanical prosthesis)  . APPENDECTOMY  child 1950  . BREAST SURGERY Left 2004  . CARDIAC CATHETERIZATION  02-22-2001   dr Martinique   minimal non-obstructive cad/  mild aortic stenosis and aortic root enlargement/  normal LVF, ef 65%  . CARDIAC CATHETERIZATION  03-22-2006    dr Martinique   no significant obstructive cad/  severe aortic stenosis  and mild to moderate aortic root enlargement/  upper normal right heart pressures  . CARDIOVASCULAR STRESS TEST  09-30-2011   dr Martinique   lexiscan nuclear study w/ apical thinning  vs  small prior apical infarct w/ no significant ischemia/  hypokinesis of the distal septum and apex, ef 50%  . CARPAL TUNNEL RELEASE Bilateral left 09-09-2004/  right 2007  . CATARACT EXTRACTION W/ INTRAOCULAR LENS  IMPLANT, BILATERAL  2015  . COLONOSCOPY N/A 10/03/2014   Procedure: COLONOSCOPY;  Surgeon: [name], MD;  Location: [place];  Service: Endoscopy;  Laterality: N/A;  . CYSTOSCOPY WITH RETROGRADE PYELOGRAM, URETEROSCOPY AND STENT PLACEMENT Right 09/17/2016   Procedure: CYSTOSCOPY WITH RIGHT RETROGRADE PYELOGRAM, RIGHT  URETEROSCOPY AND STENT PLACEMENT LASER LITHOTRIPSY, RIGHT STENT PLACEMENT;  Surgeon: [name], MD;  Location: [facility];  Service: Urology;  Laterality: Right;  . ESOPHAGEAL MANOMETRY N/A 08/12/2014   Procedure: ESOPHAGEAL MANOMETRY (EM);  Surgeon: [name], MD;  [facility];  Service: Endoscopy;  Laterality: N/A;  . ESOPHAGOGASTRODUODENOSCOPY N/A 10/03/2014   Procedure: ESOPHAGOGASTRODUODENOSCOPY (EGD);  Surgeon: [name], MD;  Location: [facility] ENDOSCOPY;  Service: Endoscopy;  Laterality: N/A;  . [place]  . FOOT SURGERY Right 2013   hammertoe x2  . HOLMIUM LASER APPLICATION Right 01/28/3342   Procedure: HOLMIUM LASER APPLICATION;  Surgeon: [name], MD;  Location[facility];  Service: Urology;  Laterality: Right;  . PERCUTANEOUS NEPHROSTOLITHOTOMY  1993  . STONE EXTRACTION WITH BASKET Right 09/17/2016   Procedure: STONE EXTRACTION WITH BASKET;  Surgeon: [name], MD;  Location: [facility];  Service: Urology;  Laterality: Right;  . TOTAL ABDOMINAL HYSTERECTOMY  1971   w/ Bilateral Salpingoophorectomy  . TRANSTHORACIC ECHOCARDIOGRAM  10/24/2014   dr Martinique   hypokinesis of the basal and mid inferoseptal and inferior walls,  ef 40-45%,  grade 1 diastolic dysfunction/  mechanical bileaflet aortic valve noted w/ mild regur., peak grandient 26mHg, valve area 1.35cm^2/  severe MV calcification without stenosis w/ mild regurg., peak gradient 379mg/  mild LAE/  poss. atrial mass (MRI showed benign lipomatous hypertrophy of atrial septum) /tFrazier ButtR/mild TR    There were no vitals filed for this visit.      Subjective Assessment - 03/03/17 0807    Subjective  Pt reports neck soreness that just started yesterday afternoon   Pertinent History PSP/Parkinsonism diagnosis 03/2016; hx L breast CA (s/p chemo, radiation, mastectomy,and reconstruction), hx of multiple falls,  aortic valve replacement, HTN, a-fib, GERD (see epic for details).   Patient Stated Goals improve ADLs, writing   Currently in Pain? Yes   Pain Score 3    Pain Location Neck   Pain Orientation Left   Pain Descriptors / Indicators Sore   Pain Type Acute pain   Pain Onset Yesterday   Aggravating Factors  movement   Pain Relieving Factors tylenol         In standing, functional reaching with each UE to place small pegs in vertical pegboard to copy design with set-up for large amplitude movements and coordination, min difficulty with coordination.  Copying design with min error for cognitive/visual-perceptual challenging.  PWR! Hands x10 in prep for writing with min cues for elbow ext, timing improved.  Writing 4 sentences with approx 95% legibility and min-mod decr in size.  Incr difficulty the more she writes.  Arm bike x5 min level 1 for reciprocal movement with min cueing  for >40rpms for incr intensity. Pt able to maintain 37-43rpms  In standing functional diagonal step and reach with each UE/LE for improved safety/balance while flipping large cards with PWR! Hands with min-mod cueing for step and incr movement amplitude.                       OT Short Term Goals - 02/22/17 0820      OT SHORT TERM GOAL #1   Title Pt will be independent with updated HEP.--check STGs 02/19/17   Time 4   Period Weeks   Status On-going  02/23/17  needs reinforcement/cueing for large amplitude     OT SHORT TERM GOAL #2   Title Pt will report improved ability to cut food and decr spills when eating with adaptive strategies.   Time 4   Period Weeks   Status Achieved  02/22/17  per pt report     OT SHORT TERM GOAL #3   Title Pt will improve functional reaching/coordination for ADLs as shown by improving score on box and blocks test by at least 4 with dominant RUE.   Baseline R-40 blocks   Time 4   Period Weeks   Status On-going  02/22/17  R-40 blocks     OT SHORT TERM GOAL #4    Title Pt will write at least 2 sentences/grocery list with at least 75% legibility and only min decr in size.   Baseline 60% legibility, mod micrographia   Time 4   Period Weeks   Status Achieved  02/22/17  approx 90% legibility and min decr in size     OT SHORT TERM GOAL #5   Title ---------------           OT Long Term Goals - 03/03/17 0827      OT LONG TERM GOAL #1   Title Pt will verbalize understanding of adaptive strategies/AE for ADLs/IADLs to incr safety/ease/independence (including eating, buttoning, styling hair, writing, and sitting on toilet).--check LTGs 03/19/17   Time 8   Period Weeks   Status New     OT LONG TERM GOAL #2   Title Pt will improve coordination for ADLs as shown by reducing time on 9-hole peg test by at least 4 sec with dominant RUE.   Baseline R-28.47sec   Time 8   Period Weeks   Status New     OT LONG TERM GOAL #3   Title Pt will improve coordination/functional reaching as shown by improving box and blocks test by at least 8 with dominant RUE.   Baseline R-40, L-48 blocks   Time 8   Period Weeks   Status New     OT LONG TERM GOAL #4   Title Pt will improve functional reaching/balance for IADLs as shown by improving standing functional reach by at least 1" with LUE.   Baseline R-11", L-9"   Time 8   Period Weeks   Status New     OT LONG TERM GOAL #5   Title Pt will write at least 2 sentences/grocery list with at least 95% legibility and only min decr in size.   Baseline approx 60% legibility with mod micrographia   Time 8   Period Weeks   Status Achieved  02/22/17  met initial goal of 90% and upgraded.  03/03/17  met at approx this level               Plan - 03/03/17 0812    Clinical Impression Statement  Pt continues to progress towards goals with improved timing with functional reaching.     Rehab Potential Good   OT Frequency 2x / week   OT Duration 8 weeks   Plan check goals next week, possible d/c next week   OT Home  Exercise Plan Education provided:  reviewed PWR! sitting (basic 4), strategies for cutting/ eating, PWR! moves in supine (with modification)   Consulted and Agree with Plan of Care Patient      Patient will benefit from skilled therapeutic intervention in order to improve the following deficits and impairments:  Decreased balance, Decreased knowledge of precautions, Decreased safety awareness, Impaired UE functional use, Impaired tone, Improper body mechanics, Decreased mobility, Decreased coordination, Decreased cognition, Decreased knowledge of use of DME, Impaired vision/preception  Visit Diagnosis: Other symptoms and signs involving the nervous system  Other symptoms and signs involving the musculoskeletal system  Other lack of coordination  Frontal lobe and executive function deficit  Unsteadiness on feet  Other abnormalities of gait and mobility  Abnormal posture  Visuospatial deficit    Problem List Patient Active Problem List   Diagnosis Date Noted  . Elevated troponin 09/19/2016  . Emesis 09/19/2016  . UTI (urinary tract infection) 09/19/2016  . Sinus bradycardia 09/16/2016  . Family history of colon cancer 07/30/2014  . Hiatal hernia 07/30/2014  . Chest pain 10/13/2011  . Disequilibrium 10/13/2011  . Heart valve replaced by other means 04/16/2011  . Chronic anticoagulation 03/17/2011  . S/P AVR (aortic valve replacement)   . PVC's (premature ventricular contractions)   . HTN (hypertension)   . Hypercholesteremia   . Atrial fibrillation with RVR ([place])   . GERD (gastroesophageal reflux disease)   . GERD 02/06/2010  . DIVERTICULOSIS-COLON 02/06/2010  . PERSONAL HX COLONIC POLYPS 02/06/2010  . MASTECTOMY, HX OF 02/06/2010    [facility] 03/03/2017, 8:40 AM  [facility] [address] [place], [place], [address] Phone: [phone]   Fax:  [phone]  Name: [name] MRN:  [id number] Date of Birth: [birth year]   Vianne Bulls, OTR/L [employer] [address] [place], [place]  [address] [phone] phone [phone] 03/03/17 8:40 AM

## 2017-03-07 NOTE — Progress Notes (Signed)
Office Visit    Patient Name: [name] Date of Encounter: 03/08/2017  Primary Care Provider:  Donnajean Lopes, MD Primary Cardiologist:  P. Martinique, MD   Chief Complaint    76 y/o ? with a h/o severe AS s/p SJM AVR and PAF s/p  hospitalization in September 2017 related to PAF and elevated troponin, who presents for f/u.  Past Medical History    Past Medical History:  Diagnosis Date  . Anticoagulated on Coumadin   . Arthropathy of cervical facet joint    C2-3  . At risk for falls   . BPV (benign positional vertigo)   . Bruises easily   . Cancer ([place]) 2004    breast-lumpectomy,nodes ,radiation,chemo  . Chronic neck pain   . CKD (chronic kidney disease), stage II   . Diverticulosis of colon   . Elevated troponin    a. 08/2016 in setting of PAF;  b. 08/2016 low risk MV w/ fixed septal defect (LBBB), EF 53%.  Marland Kitchen GERD (gastroesophageal reflux disease)   . Hiatal hernia   . History of aortic valve stenosis   . History of breast cancer per pt no recurrence   dx 2004-- Left breast DCIS (ER/PR+, HER2 negative) s/p  partial masectomy w/ sln bx and  chemoradiotion (completed 2004)  . History of colon polyps   . History of herpes zoster   . History of kidney stones   . History of kidney stones   . HTN (hypertension)   . Hyperlipidemia   . Hypertrophy of inter-atrial septum    a. 09/2014 Echo: ? of atrial mass; b. 10/2014 Cardiac MRI: no atrial septal mass - polypoid lipomatous hypertrophy of superior atrial septum noted-->Benign.  . Irritable bowel syndrome   . LBBB (left bundle branch block)   . PAF (paroxysmal atrial fibrillation) (HCC)    a. CHA2DS2VASc = 4-->coumadin.  . Parkinsonism [facility])    neurologist-  dr tat  . PONV (postoperative nausea and vomiting)   . PVC's (premature ventricular contractions)   . Right ureteral stone   . S/P aortic valve replacement with prosthetic valve    a. 04/13/2006 s/p St Jude mechnical AVR for severe AS;  b. 09/2014 Echo: EF  40-45%, gr1 DD, mild AI/MR, triv TR/PR.  . SUI (stress urinary incontinence, female)   . Unstable balance    uses cane  . Venous varices     Past Surgical History:  Procedure Laterality Date  . ANTERIOR AND POSTERIOR REPAIR  1990   cystocele/ rectocele  . AORTIC VALVE REPLACEMENT  04/13/06   dr gerhardt   and Replacement Ascending Aorta (St. Jude mechanical prosthesis)  . APPENDECTOMY  child 1950  . BREAST SURGERY Left 2004  . CARDIAC CATHETERIZATION  02-22-2001   dr Martinique   minimal non-obstructive cad/  mild aortic stenosis and aortic root enlargement/  normal LVF, ef 65%  . CARDIAC CATHETERIZATION  03-22-2006    dr Martinique   no significant obstructive cad/  severe aortic stenosis and mild to moderate aortic root enlargement/  upper normal right heart pressures  . CARDIOVASCULAR STRESS TEST  09-30-2011   dr Martinique   lexiscan nuclear study w/ apical thinning  vs  small prior apical infarct w/ no significant ischemia/  hypokinesis of the distal septum and apex, ef 50%  . CARPAL TUNNEL RELEASE Bilateral left 09-09-2004/  right 2007  . CATARACT EXTRACTION W/ INTRAOCULAR LENS  IMPLANT, BILATERAL  2015  . COLONOSCOPY N/A 10/03/2014   Procedure: COLONOSCOPY;  Surgeon: [name], MD;  Location: [facility] ENDOSCOPY;  Service: Endoscopy;  Laterality: N/A;  . CYSTOSCOPY WITH RETROGRADE PYELOGRAM, URETEROSCOPY AND STENT PLACEMENT Right 09/17/2016   Procedure: CYSTOSCOPY WITH RIGHT RETROGRADE PYELOGRAM, RIGHT URETEROSCOPY AND STENT PLACEMENT LASER LITHOTRIPSY, RIGHT STENT PLACEMENT;  Surgeon: [name], MD;  Location: [facility];  Service: Urology;  Laterality: Right;  . ESOPHAGEAL MANOMETRY N/A 08/12/2014   Procedure: ESOPHAGEAL MANOMETRY (EM);  Surgeon: [name], MD;  Location: [facility] ENDOSCOPY;  Service: Endoscopy;  Laterality: N/A;  . ESOPHAGOGASTRODUODENOSCOPY N/A 10/03/2014   Procedure: ESOPHAGOGASTRODUODENOSCOPY (EGD);  Surgeon: [name], MD;  Location: [facility] ENDOSCOPY;   Service: Endoscopy;  Laterality: N/A;  . [place]  . FOOT SURGERY Right 2013   hammertoe x2  . HOLMIUM LASER APPLICATION Right 08/24/5620   Procedure: HOLMIUM LASER APPLICATION;  Surgeon: [name], MD;  Location: [facility];  Service: Urology;  Laterality: Right;  . PERCUTANEOUS NEPHROSTOLITHOTOMY  1993  . STONE EXTRACTION WITH BASKET Right 09/17/2016   Procedure: STONE EXTRACTION WITH BASKET;  Surgeon: [name], MD;  Location: [facility];  Service: Urology;  Laterality: Right;  . TOTAL ABDOMINAL HYSTERECTOMY  1971   w/ Bilateral Salpingoophorectomy  . TRANSTHORACIC ECHOCARDIOGRAM  10/24/2014   dr Martinique   hypokinesis of the basal and mid inferoseptal and inferior walls,  ef 40-45%,  grade 1 diastolic dysfunction/  mechanical bileaflet aortic valve noted w/ mild regur., peak grandient 48mHg, valve area 1.35cm^2/  severe MV calcification without stenosis w/ mild regurg., peak gradient 356mg/  mild LAE/  poss. atrial mass (MRI showed benign lipomatous hypertrophy of atrial septum) /tFrazier ButtR/mild TR    Allergies  Allergies  Allergen Reactions  . Demerol [Meperidine] Shortness Of Breath and Swelling  . Oxycodone Other (See Comments)    Hallucinations   . Adhesive [Tape] Other (See Comments)    Redness and skin tears  . Ivp Dye [Iodinated Diagnostic Agents] Hives    OK with benadryl pre-med (5032mne hour before receiving iodinated contrast agent)  . Phenergan [Promethazine] Other (See Comments)    Avoids due to reaction with current medications    History of Present Illness    75 58o ? with a h/o severe AS s/p SJM mech AVR in 2007. She also has a h/o PAF and is chronically anticoagulated with coumadin.  She was last seen by Dr. JorMartinique late September, just prior to planned ureteral stenting.  At that time, she was noted to be bradycardic and her  blocker was d/c'd.  She underwent her urologic  procedure the following day and tolerated it well.  Unfortunately, on 9/23, she developed abd pain, nausea, c/p, sob, and palpitations, prompting her to call EMS.  She was found to be in AF with RVR, which later converted to sinus rhythm.  [facility].  She was seen by our team and underwent stress testing, which was low risk.  She was seen by Dr. KleCaryl Comesring the admission and it was felt that in the setting of tachy-brady, she would likely require a pacer in the near future.  The remainder of her hospitalization was uneventful and since d/c, she has done reasonably well.  She has not been having any chest pain or dyspnea, but has had one episode of palpitations lasting about twenty mintues with a max HR of about 100. She thinks this was likely afib.  She denies pnd, orthopnea, n, v, dizziness, syncope, edema, weight gain, or early satiety. She was seen in October by Dr Caryl Comes who felt her Afib episodes were infrequent and recommended following for now. If episodes became more frequent then consider resumption of beta blocker or AAD without rate slowing effects such as Tikosyn.   On follow up today she is doing well. Denies any recurrent episodes of AFib since October. Coumadin has been stable. No dizziness, chest pain, dyspnea.   Home Medications    Prior to Admission medications   Medication Sig Start Date End Date Taking? Authorizing Provider  amoxicillin-clavulanate (AUGMENTIN) 500-125 MG tablet Take 1 tablet (500 mg total) by mouth 2 (two) times daily. 09/21/16   Florinda Marker, MD  calcium citrate-vitamin D (CITRACAL+D) 315-200 MG-UNIT per tablet Take 1 tablet by mouth 3 (three) times daily.    Historical Provider, MD  carbidopa-levodopa (SINEMET IR) 25-100 MG tablet Take 2 tablets by mouth 3 (three) times daily. 07/06/16   Eustace Quail Tat, DO  Cholecalciferol (VITAMIN D3) 2000 units TABS Take 1 capsule by mouth daily.    Historical Provider, MD  enoxaparin  (LOVENOX) 80 MG/0.8ML injection Inject 80 mg into the skin 2 (two) times daily.  09/10/16   Historical Provider, MD  famotidine-calcium carbonate-magnesium hydroxide (PEPCID COMPLETE) 10-800-165 MG CHEW chewable tablet Chew 1 tablet by mouth daily as needed (heartburn).    Historical Provider, MD  Multiple Vitamin (MULTIVITAMIN) tablet Take 1 tablet by mouth daily at 12 noon.     Historical Provider, MD  Multiple Vitamins-Minerals (VISION-VITE PRESERVE PO) Take 1 tablet by mouth 2 (two) times daily.    Historical Provider, MD  oxybutynin (DITROPAN) 5 MG tablet Take 2.5 mg by mouth 2 (two) times daily.     Historical Provider, MD  pantoprazole (PROTONIX) 40 MG tablet Take 40 mg by mouth every morning.     Historical Provider, MD  phenazopyridine (PYRIDIUM) 200 MG tablet Take 1 tablet (200 mg total) by mouth 3 (three) times daily as needed for pain. 09/17/16   [name], MD  simvastatin (ZOCOR) 20 MG tablet Take 20 mg by mouth every evening.    Historical Provider, MD  sulfamethoxazole-trimethoprim (BACTRIM DS) 800-160 MG tablet Take 1 tablet by mouth daily. 09/21/16   Jessica Ratliff Hoffman, DO  triamterene-hydrochlorothiazide (MAXZIDE-25) 37.5-25 MG per tablet Take 1 tablet by mouth every morning.     Historical Provider, MD  UNABLE TO FIND Take 900 mg by mouth daily at 12 noon. Omega Red     Historical Provider, MD  vitamin B-12 (CYANOCOBALAMIN) 1000 MCG tablet Take 1,000 mcg by mouth every morning.    Historical Provider, MD  warfarin (COUMADIN) 2.5 MG tablet Take 1 tablet (2.5 mg total) by mouth one time only at 6 PM. 09/21/16   Valinda Party, DO  warfarin (COUMADIN) 5 MG tablet Take 5 mg by mouth every evening. 7.5 mg every Tues, Thurs, Sat; 5 mg all other days    Historical Provider, MD    Review of Systems    As noted in HPI.  All other systems reviewed and are otherwise negative except as noted above.  Physical Exam    VS:  BP (!) 146/78   Pulse 60   Ht 4' 11.75" (1.518  m)   Wt 137 lb (62.1 kg)   BMI 26.98 kg/m  , BMI Body mass index is 26.98 kg/m. GEN: Well nourished, well developed, in no acute distress.  HEENT: normal.  Neck:  Supple, no JVD, carotid bruits, or masses. Cardiac: RRR, 2/6 syst murmur @ bilat USB with S2 click, no rubs, or gallops. No clubbing, cyanosis, edema.  Radials/DP/PT 2+ and equal bilaterally.  Respiratory:  Respirations regular and unlabored, clear to auscultation bilaterally. GI: Soft, nontender, nondistended, BS + x 4. MS: no deformity or atrophy. Skin: warm and dry, no rash. Neuro:  Strength and sensation are intact. Psych: Normal affect.  Accessory Clinical Findings    Lab Results  Component Value Date   WBC 4.7 09/21/2016   HGB 9.4 (L) 09/21/2016   HCT 28.9 (L) 09/21/2016   PLT 161 09/21/2016   GLUCOSE 124 (H) 09/21/2016   CHOL 145 09/30/2011   TRIG 42 09/30/2011   HDL 77 09/30/2011   LDLCALC 60 09/30/2011   ALT 7 (L) 09/19/2016   AST 36 09/19/2016   NA 135 09/21/2016   K 3.6 09/21/2016   CL 103 09/21/2016   CREATININE 1.58 (H) 09/21/2016   BUN 17 09/21/2016   CO2 25 09/21/2016   INR 2.52 09/21/2016   Labs dated 05/10/16: cholesterol 171, triglycerides 81, HDL 58, LDL 97.   Assessment & Plan    1.  PAF/Tachy-brady syndrome:  Pt has a h/o PAF, previously managed with metoprolol xl 25 mg daily, however this was stopped due to significant sinus bradycardia with rates in the 40's @ office visit on 9/21.  She developed AFib on 9/23, prompting presentation to [facility].  She converted spontaneously to sinus.  She denies any recurrent symptomatic Afib since then. Will continue to monitor off beta blocker. I will follow up in 6 months.  2.  S/P SJM AVR:  Stable on echo in 2015.  Chronic coumadin.  3.  Essential HTN:  Stable.  4.  HL:  Cont statin.  Crewe Heathman Martinique, MD, [facility] 03/08/2017, 12:08 PM

## 2017-03-08 ENCOUNTER — Encounter: Payer: Self-pay | Admitting: Cardiology

## 2017-03-08 ENCOUNTER — Ambulatory Visit (INDEPENDENT_AMBULATORY_CARE_PROVIDER_SITE_OTHER): Payer: PPO | Admitting: Cardiology

## 2017-03-08 ENCOUNTER — Ambulatory Visit: Payer: PPO | Admitting: Occupational Therapy

## 2017-03-08 ENCOUNTER — Ambulatory Visit: Payer: PPO

## 2017-03-08 VITALS — BP 146/78 | HR 60 | Ht 59.75 in | Wt 137.0 lb

## 2017-03-08 DIAGNOSIS — I480 Paroxysmal atrial fibrillation: Secondary | ICD-10-CM

## 2017-03-08 DIAGNOSIS — R278 Other lack of coordination: Secondary | ICD-10-CM

## 2017-03-08 DIAGNOSIS — I495 Sick sinus syndrome: Secondary | ICD-10-CM | POA: Diagnosis not present

## 2017-03-08 DIAGNOSIS — I10 Essential (primary) hypertension: Secondary | ICD-10-CM

## 2017-03-08 DIAGNOSIS — R29818 Other symptoms and signs involving the nervous system: Secondary | ICD-10-CM

## 2017-03-08 DIAGNOSIS — Z952 Presence of prosthetic heart valve: Secondary | ICD-10-CM | POA: Diagnosis not present

## 2017-03-08 DIAGNOSIS — R2681 Unsteadiness on feet: Secondary | ICD-10-CM

## 2017-03-08 DIAGNOSIS — R293 Abnormal posture: Secondary | ICD-10-CM

## 2017-03-08 DIAGNOSIS — R471 Dysarthria and anarthria: Secondary | ICD-10-CM | POA: Diagnosis not present

## 2017-03-08 DIAGNOSIS — R41842 Visuospatial deficit: Secondary | ICD-10-CM

## 2017-03-08 DIAGNOSIS — R41844 Frontal lobe and executive function deficit: Secondary | ICD-10-CM

## 2017-03-08 DIAGNOSIS — R29898 Other symptoms and signs involving the musculoskeletal system: Secondary | ICD-10-CM

## 2017-03-08 DIAGNOSIS — R2689 Other abnormalities of gait and mobility: Secondary | ICD-10-CM

## 2017-03-08 NOTE — Patient Instructions (Signed)
Continue your current therapy  I will see you in 6 months.   

## 2017-03-08 NOTE — Therapy (Signed)
[facility] [address] [place], [place], [address] Phone: [phone]   Fax:  [phone]  Speech Language Pathology Treatment  Patient Details  Name: [name] MRN: [id number] Date of Birth: 1941/01/16 Referring Provider: Alonza Bogus DO  Encounter Date: 03/08/2017      End of Session - 03/08/17 1436    Visit Number 8   Number of Visits 17   Date for SLP Re-Evaluation 03/25/17   SLP Start Time 28   SLP Stop Time  1400   SLP Time Calculation (min) 42 min   Activity Tolerance Patient tolerated treatment well      Past Medical History:  Diagnosis Date  . Anticoagulated on Coumadin   . Arthropathy of cervical facet joint    C2-3  . At risk for falls   . BPV (benign positional vertigo)   . Bruises easily   . Cancer ([place]) 2004    breast-lumpectomy,nodes ,radiation,chemo  . Chronic neck pain   . CKD (chronic kidney disease), stage II   . Diverticulosis of colon   . Elevated troponin    a. 08/2016 in setting of PAF;  b. 08/2016 low risk MV w/ fixed septal defect (LBBB), EF 53%.  Marland Kitchen GERD (gastroesophageal reflux disease)   . Hiatal hernia   . History of aortic valve stenosis   . History of breast cancer per pt no recurrence   dx 2004-- Left breast DCIS (ER/PR+, HER2 negative) s/p  partial masectomy w/ sln bx and  chemoradiotion (completed 2004)  . History of colon polyps   . History of herpes zoster   . History of kidney stones   . History of kidney stones   . HTN (hypertension)   . Hyperlipidemia   . Hypertrophy of inter-atrial septum    a. 09/2014 Echo: ? of atrial mass; b. 10/2014 Cardiac MRI: no atrial septal mass - polypoid lipomatous hypertrophy of superior atrial septum noted-->Benign.  . Irritable bowel syndrome   . LBBB (left bundle branch block)   . PAF (paroxysmal atrial fibrillation) (HCC)    a. CHA2DS2VASc = 4-->coumadin.  . Parkinsonism [facility])    neurologist-  dr tat  . PONV  (postoperative nausea and vomiting)   . PVC's (premature ventricular contractions)   . Right ureteral stone   . S/P aortic valve replacement with prosthetic valve    a. 04/13/2006 s/p St Jude mechnical AVR for severe AS;  b. 09/2014 Echo: EF 40-45%, gr1 DD, mild AI/MR, triv TR/PR.  . SUI (stress urinary incontinence, female)   . Unstable balance    uses cane  . Venous varices      Past Surgical History:  Procedure Laterality Date  . ANTERIOR AND POSTERIOR REPAIR  1990   cystocele/ rectocele  . AORTIC VALVE REPLACEMENT  04/13/06   dr gerhardt   and Replacement Ascending Aorta (St. Jude mechanical prosthesis)  . APPENDECTOMY  child 1950  . BREAST SURGERY Left 2004  . CARDIAC CATHETERIZATION  02-22-2001   dr Martinique   minimal non-obstructive cad/  mild aortic stenosis and aortic root enlargement/  normal LVF, ef 65%  . CARDIAC CATHETERIZATION  03-22-2006    dr Martinique   no significant obstructive cad/  severe aortic stenosis and mild to moderate aortic root enlargement/  upper normal right heart pressures  . CARDIOVASCULAR STRESS TEST  09-30-2011   dr Martinique   lexiscan nuclear study w/ apical thinning  vs  small prior apical infarct w/ no significant ischemia/  hypokinesis of the distal septum and apex, ef 50%  . CARPAL TUNNEL RELEASE Bilateral left 09-09-2004/  right 2007  . CATARACT EXTRACTION W/ INTRAOCULAR LENS  IMPLANT, BILATERAL  2015  . COLONOSCOPY N/A 10/03/2014   Procedure: COLONOSCOPY;  Surgeon: [name], MD;  Location: [place];  Service: Endoscopy;  Laterality: N/A;  . CYSTOSCOPY WITH RETROGRADE PYELOGRAM, URETEROSCOPY AND STENT PLACEMENT Right 09/17/2016   Procedure: CYSTOSCOPY WITH RIGHT RETROGRADE PYELOGRAM, RIGHT URETEROSCOPY AND STENT PLACEMENT LASER LITHOTRIPSY, RIGHT STENT PLACEMENT;  Surgeon: [name], MD;  Location: [facility];  Service: Urology;  Laterality: Right;  . ESOPHAGEAL MANOMETRY N/A 08/12/2014   Procedure: ESOPHAGEAL MANOMETRY  (EM);  Surgeon: [name], MD;  Location: [facility] ENDOSCOPY;  Service: Endoscopy;  Laterality: N/A;  . ESOPHAGOGASTRODUODENOSCOPY N/A 10/03/2014   Procedure: ESOPHAGOGASTRODUODENOSCOPY (EGD);  Surgeon: [name], MD;  [facility]COPY;  Service: Endoscopy;  Laterality: N/A;  . [place]  . FOOT SURGERY Right 2013   hammertoe x2  . HOLMIUM LASER APPLICATION Right 04/21/8340   Procedure: HOLMIUM LASER APPLICATION;  Surgeon: [name], MD;  Location: [facility];  Service: Urology;  Laterality: Right;  . PERCUTANEOUS NEPHROSTOLITHOTOMY  1993  . STONE EXTRACTION WITH BASKET Right 09/17/2016   Procedure: STONE EXTRACTION WITH BASKET;  Surgeon: [name], MD;  Location: [facility];  Service: Urology;  Laterality: Right;  . TOTAL ABDOMINAL HYSTERECTOMY  1971   w/ Bilateral Salpingoophorectomy  . TRANSTHORACIC ECHOCARDIOGRAM  10/24/2014   dr Martinique   hypokinesis of the basal and mid inferoseptal and inferior walls,  ef 40-45%,  grade 1 diastolic dysfunction/  mechanical bileaflet aortic valve noted w/ mild regur., peak grandient 58mHg, valve area 1.35cm^2/  severe MV calcification without stenosis w/ mild regurg., peak gradient 37mg/  mild LAE/  poss. atrial mass (MRI showed benign lipomatous hypertrophy of atrial septum) /tFrazier ButtR/mild TR    There were no vitals filed for this visit.             ADULT SLP TREATMENT - 03/08/17 1323      General Information   Behavior/Cognition Alert;Cooperative;Pleasant mood     Treatment Provided   Treatment provided Cognitive-Linquistic     Pain Assessment   Pain Assessment No/denies pain     Cognitive-Linquistic Treatment   Treatment focused on Dysarthria   Skilled Treatment SLP facilitated pt's loudness recalibration with loud /a/ average 91dB (30 cm from sound source). In multi-sentence level responses pt maintained 72dB average, and in multi-sentence  responses pt maintained average 71.5 dB independently. In 18 min conversation pt also maintained 71dB, with rare min A to take full breaths and not talk on residual volume.  Rushes of speech occurred x2 during this time.     Assessment / Recommendations / Plan   Plan Continue with current plan of care     Progression Toward Goals   Progression toward goals Progressing toward goals            SLP Short Term Goals - 03/08/17 1437      SLP SHORT TERM GOAL #1   Title pt will produce loud /a/ with proper voicing at average low 90's dB over three sessions   Status Achieved     SLP SHORT TERM GOAL #2   Title pt will converse for 10 minutes re: simple topic with appropriate breath support for average volume in low 70s dB.   Status Achieved  SLP SHORT TERM GOAL #3   Title pt will exhibit no greater than 2 episodes of rushing of speech in 10 minutes simple conversation over three sessions   Baseline --   Time --   Period --   Status Partially Met          SLP Long Term Goals - 03/08/17 1437      SLP LONG TERM GOAL #1   Title pt will demo no more than one episode of rushing of speech in 15 minutes mod complex conversation over two sessions   Time 3   Period Weeks   Status On-going     SLP LONG TERM GOAL #2   Title pt will achieve 15 minutes mod complex conversation with appropriate breath support to allow no > 1 instance of speaking on residual air   Time 3   Period Weeks   Status On-going     SLP LONG TERM GOAL #3   Title pt will sustain /a/ at average low 90s dB over 6 sessions   Baseline 3-1, 3-6, 3-8, 3-13   Time 3   Period Weeks   Status On-going          Plan - 03/08/17 1436    Clinical Impression Statement Pt maintained WNL loudness in sentence, multisentence and conversation today, with most frequent cues necessary in conversation; short rushes of speech x2 today. Suspect no more than 2 more weeks of therapy necessary. Continue skilled ST to maximize  intelligiblity.    Speech Therapy Frequency 2x / week   Duration --  8 weeks   Treatment/Interventions Compensatory strategies;Functional tasks;Patient/family education;Cueing hierarchy;Internal/external aids;SLP instruction and feedback   Potential to Achieve Goals Good      Patient will benefit from skilled therapeutic intervention in order to improve the following deficits and impairments:   Dysarthria and anarthria    Problem List Patient Active Problem List   Diagnosis Date Noted  . Elevated troponin 09/19/2016  . Emesis 09/19/2016  . UTI (urinary tract infection) 09/19/2016  . Sinus bradycardia 09/16/2016  . Family history of colon cancer 07/30/2014  . Hiatal hernia 07/30/2014  . Chest pain 10/13/2011  . Disequilibrium 10/13/2011  . Heart valve replaced by other means 04/16/2011  . Chronic anticoagulation 03/17/2011  . S/P AVR (aortic valve replacement)   . PVC's (premature ventricular contractions)   . HTN (hypertension)   . Hypercholesteremia   . Atrial fibrillation with RVR ([place])   . GERD (gastroesophageal reflux disease)   . GERD 02/06/2010  . DIVERTICULOSIS-COLON 02/06/2010  . PERSONAL HX COLONIC POLYPS 02/06/2010  . MASTECTOMY, HX OF 02/06/2010    [facility] ,MS, CCC-SLP  03/08/2017, 2:42 PM  [facility] [address] [place], [place], [address] Phone: [phone]   Fax:  [phone]   Name: [name] MRN: [id number] Date of Birth: [birth year]

## 2017-03-08 NOTE — Therapy (Signed)
[facility] [address] [place], [place], [address] Phone: [phone]   Fax:  [phone]  Occupational Therapy Treatment  Patient Details  Name: [name] MRN: [id number] Date of Birth: 24-Dec-1941 Referring Provider: Dr. Lurena Joiner Tat  Encounter Date: 03/08/2017      OT End of Session - 03/08/17 1410    Visit Number 8   Number of Visits 17   Date for OT Re-Evaluation 03/19/17   Authorization Type [employer], no visit limit/no auth, G-code needed   Authorization Time Period scheduled through 03/10/17 currently   Authorization - Visit Number 8   Authorization - Number of Visits 10   OT Start Time 1404   OT Stop Time 1445   OT Time Calculation (min) 41 min   Activity Tolerance Patient tolerated treatment well   Behavior During Therapy [facility] for tasks assessed/performed      Past Medical History:  Diagnosis Date  . Anticoagulated on Coumadin   . Arthropathy of cervical facet joint    C2-3  . At risk for falls   . BPV (benign positional vertigo)   . Bruises easily   . Cancer (HCC) 2004    breast-lumpectomy,nodes ,radiation,chemo  . Chronic neck pain   . CKD (chronic kidney disease), stage II   . Diverticulosis of colon   . Elevated troponin    a. 08/2016 in setting of PAF;  b. 08/2016 low risk MV w/ fixed septal defect (LBBB), EF 53%.  Marland Kitchen GERD (gastroesophageal reflux disease)   . Hiatal hernia   . History of aortic valve stenosis   . History of breast cancer per pt no recurrence   dx 2004-- Left breast DCIS (ER/PR+, HER2 negative) s/p  partial masectomy w/ sln bx and  chemoradiotion (completed 2004)  . History of colon polyps   . History of herpes zoster   . History of kidney stones   . History of kidney stones   . HTN (hypertension)   . Hyperlipidemia   . Hypertrophy of inter-atrial septum    a. 09/2014 Echo: ? of atrial mass; b. 10/2014 Cardiac MRI: no atrial septal mass - polypoid  lipomatous hypertrophy of superior atrial septum noted-->Benign.  . Irritable bowel syndrome   . LBBB (left bundle branch block)   . PAF (paroxysmal atrial fibrillation) (HCC)    a. CHA2DS2VASc = 4-->coumadin.  . Parkinsonism [facility])    neurologist-  dr tat  . PONV (postoperative nausea and vomiting)   . PVC's (premature ventricular contractions)   . Right ureteral stone   . S/P aortic valve replacement with prosthetic valve    a. 04/13/2006 s/p St Jude mechnical AVR for severe AS;  b. 09/2014 Echo: EF 40-45%, gr1 DD, mild AI/MR, triv TR/PR.  . SUI (stress urinary incontinence, female)   . Unstable balance    uses cane  . Venous varices      Past Surgical History:  Procedure Laterality Date  . ANTERIOR AND POSTERIOR REPAIR  1990   cystocele/ rectocele  . AORTIC VALVE REPLACEMENT  04/13/06   dr gerhardt   and Replacement Ascending Aorta (St. Jude mechanical prosthesis)  . APPENDECTOMY  child 1950  . BREAST SURGERY Left 2004  . CARDIAC CATHETERIZATION  02-22-2001   dr [place]   minimal non-obstructive cad/  mild aortic stenosis and aortic root enlargement/  normal LVF, ef 65%  . CARDIAC CATHETERIZATION  03-22-2006    dr [place]   no significant obstructive cad/  severe aortic stenosis  and mild to moderate aortic root enlargement/  upper normal right heart pressures  . CARDIOVASCULAR STRESS TEST  09-30-2011   dr [place]   lexiscan nuclear study w/ apical thinning  vs  small prior apical infarct w/ no significant ischemia/  hypokinesis of the distal septum and apex, ef 50%  . CARPAL TUNNEL RELEASE Bilateral left 09-09-2004/  right 2007  . CATARACT EXTRACTION W/ INTRAOCULAR LENS  IMPLANT, BILATERAL  2015  . COLONOSCOPY N/A 10/03/2014   Procedure: COLONOSCOPY;  Surgeon: [name], MD;  Location: [facility];  Service: Endoscopy;  Laterality: N/A;  . CYSTOSCOPY WITH RETROGRADE PYELOGRAM, URETEROSCOPY AND STENT PLACEMENT Right 09/17/2016   Procedure: CYSTOSCOPY WITH RIGHT RETROGRADE  PYELOGRAM, RIGHT URETEROSCOPY AND STENT PLACEMENT LASER LITHOTRIPSY, RIGHT STENT PLACEMENT;  Surgeon: [name], MD;  Location: [facility];  Service: Urology;  Laterality: Right;  . ESOPHAGEAL MANOMETRY N/A 08/12/2014   Procedure: ESOPHAGEAL MANOMETRY (EM);  Surgeon: [name], MD;  Location: [facility];  Service: Endoscopy;  Laterality: N/A;  . ESOPHAGOGASTRODUODENOSCOPY N/A 10/03/2014   Procedure: ESOPHAGOGASTRODUODENOSCOPY (EGD);  Surgeon: [name], MD;  Location: [facility] END[facility]COPY;  Service: Endoscopy;  Laterality: N/A;  . [place]  . FOOT SURGERY Right 2013   hammertoe x2  . HOLMIUM LASER APPLICATION Right 4/94/4967   Procedure: HOLMIUM LASER APPLICATION;  Surgeon: [name], MD;  Location: [facility];  Service: Urology;  Laterality: Right;  . PERCUTANEOUS NEPHROSTOLITHOTOMY  1993  . STONE EXTRACTION WITH BASKET Right 09/17/2016   Procedure: STONE EXTRACTION WITH BASKET;  Surgeon: [name], MD;  Location: [facility];  Service: Urology;  Laterality: Right;  . TOTAL ABDOMINAL HYSTERECTOMY  1971   w/ Bilateral Salpingoophorectomy  . TRANSTHORACIC ECHOCARDIOGRAM  10/24/2014   dr [place]   hypokinesis of the basal and mid inferoseptal and inferior walls,  ef 40-45%,  grade 1 diastolic dysfunction/  mechanical bileaflet aortic valve noted w/ mild regur., peak grandient 14mHg, valve area 1.35cm^2/  severe MV calcification without stenosis w/ mild regurg., peak gradient 33mg/  mild LAE/  poss. atrial mass (MRI showed benign lipomatous hypertrophy of atrial septum) /tFrazier ButtR/mild TR    There were no vitals filed for this visit.      Subjective Assessment - 03/08/17 1405    Subjective  [place]   Pertinent History PSP/Parkinsonism diagnosis 03/2016; hx L breast CA (s/p chemo, radiation, mastectomy,and reconstruction), hx  of multiple falls, aortic valve replacement, HTN, a-fib, GERD (see epic for details).   Limitations fall risk   Patient Stated Goals improve ADLs, writing   Currently in Pain? No/denies   Pain Onset Yesterday         In standing, functional reaching with each UE overhead with trunk rotation/wt shift forward/laterally to place large pegs in vertical pegboard incorporating in-hand coordination of 2 pegs and PWR! Hands with min cueing.  Arm bike x5 min level 1 for reciprocal movement with min cueing for >40rpms for incr intensity. Pt able to maintain 38-43rpms (forward/backwards)  In standing functional diagonal step and reach with each UE/LE for improved safety/balance while flipping large cards with PWR! Hands with min-mod cueing for step and incr movement amplitude.  Placing grooved pegs in pegboard with each hand with min cueing for use of PWR! Hands prior to picking up peg for timing and larger amplitude movements with each hand (incr difficulty with R  hand)  Walking backwards with RW in prep for backing up to chair, toilet, bed, etc. With min cueing for deliberate, large steps.  Began checking goals and discussing progress.--see goals section for scores/details.                        OT Short Term Goals - 03/08/17 1405      OT SHORT TERM GOAL #1   Title Pt will be independent with updated HEP.--check STGs 02/19/17   Time 4   Period Weeks   Status Achieved  02/23/17  needs reinforcement/cueing for large amplitude; 03/08/17     OT SHORT TERM GOAL #2   Title Pt will report improved ability to cut food and decr spills when eating with adaptive strategies.   Time 4   Period Weeks   Status Achieved  02/22/17  per pt report     OT SHORT TERM GOAL #3   Title Pt will improve functional reaching/coordination for ADLs as shown by improving score on box and blocks test by at least 4 with dominant RUE.   Baseline R-40 blocks   Time 4   Period Weeks   Status On-going   02/22/17  R-40 blocks     OT SHORT TERM GOAL #4   Title Pt will write at least 2 sentences/grocery list with at least 75% legibility and only min decr in size.   Baseline 60% legibility, mod micrographia   Time 4   Period Weeks   Status Achieved  02/22/17  approx 90% legibility and min decr in size     OT SHORT TERM GOAL #5   Title ---------------           OT Long Term Goals - 03/03/17 0827      OT LONG TERM GOAL #1   Title Pt will verbalize understanding of adaptive strategies/AE for ADLs/IADLs to incr safety/ease/independence (including eating, buttoning, styling hair, writing, and sitting on toilet).--check LTGs 03/19/17   Time 8   Period Weeks   Status New     OT LONG TERM GOAL #2   Title Pt will improve coordination for ADLs as shown by reducing time on 9-hole peg test by at least 4 sec with dominant RUE.   Baseline R-28.47sec   Time 8   Period Weeks   Status New     OT LONG TERM GOAL #3   Title Pt will improve coordination/functional reaching as shown by improving box and blocks test by at least 8 with dominant RUE.   Baseline R-40, L-48 blocks   Time 8   Period Weeks   Status New     OT LONG TERM GOAL #4   Title Pt will improve functional reaching/balance for IADLs as shown by improving standing functional reach by at least 1" with LUE.   Baseline R-11", L-9"   Time 8   Period Weeks   Status New     OT LONG TERM GOAL #5   Title Pt will write at least 2 sentences/grocery list with at least 95% legibility and only min decr in size.   Baseline approx 60% legibility with mod micrographia   Time 8   Period Weeks   Status Achieved  02/22/17  met initial goal of 90% and upgraded.  03/03/17  met at approx this level               Plan - 03/08/17 1411    Rehab Potential Good   OT Frequency  2x / week   OT Duration 8 weeks   OT Treatment/Interventions Self-care/ADL training;Therapeutic exercise;Cognitive remediation/compensation;Visual/perceptual  remediation/compensation;Neuromuscular education;Moist Heat;Parrafin;Ultrasound;Cryotherapy;Fluidtherapy;Therapist, nutritional;Therapeutic exercises;Patient/family education;Balance training;Therapeutic activities;Manual Therapy;Passive range of motion;DME and/or AE instruction;Energy conservation;Splinting   Plan check remaining goals and anticipate d/c next session   OT Home Exercise Plan Education provided:  reviewed PWR! sitting (basic 4), strategies for cutting/ eating, PWR! moves in supine (with modification)   Consulted and Agree with Plan of Care Patient      Patient will benefit from skilled therapeutic intervention in order to improve the following deficits and impairments:  Decreased balance, Decreased knowledge of precautions, Decreased safety awareness, Impaired UE functional use, Impaired tone, Improper body mechanics, Decreased mobility, Decreased coordination, Decreased cognition, Decreased knowledge of use of DME, Impaired vision/preception  Visit Diagnosis: Other symptoms and signs involving the nervous system  Other symptoms and signs involving the musculoskeletal system  Other lack of coordination  Frontal lobe and executive function deficit  Unsteadiness on feet  Other abnormalities of gait and mobility  Abnormal posture  Visuospatial deficit    Problem List Patient Active Problem List   Diagnosis Date Noted  . Elevated troponin 09/19/2016  . Emesis 09/19/2016  . UTI (urinary tract infection) 09/19/2016  . Sinus bradycardia 09/16/2016  . Family history of colon cancer 07/30/2014  . Hiatal hernia 07/30/2014  . Chest pain 10/13/2011  . Disequilibrium 10/13/2011  . Heart valve replaced by other means 04/16/2011  . Chronic anticoagulation 03/17/2011  . S/P AVR (aortic valve replacement)   . PVC's (premature ventricular contractions)   . HTN (hypertension)   . Hypercholesteremia   . Atrial fibrillation with RVR ([place])   . GERD (gastroesophageal  reflux disease)   . GERD 02/06/2010  . DIVERTICULOSIS-COLON 02/06/2010  . PERSONAL HX COLONIC POLYPS 02/06/2010  . MASTECTOMY, HX OF 02/06/2010    [facility] 03/08/2017, 2:11 PM  [place] [address] [place], [place], [address] Phone: [phone]   Fax:  [phone]  Name: [name] MRN: [id number] Date of Birth: [birth year]   Vianne Bulls, OTR/L [facility] [address] [place], [place]  [address] [phone] phone [phone] 03/08/17 2:45 PM

## 2017-03-10 ENCOUNTER — Ambulatory Visit: Payer: PPO | Admitting: Speech Pathology

## 2017-03-10 ENCOUNTER — Ambulatory Visit: Payer: PPO | Admitting: Occupational Therapy

## 2017-03-10 DIAGNOSIS — R471 Dysarthria and anarthria: Secondary | ICD-10-CM

## 2017-03-10 DIAGNOSIS — Z7901 Long term (current) use of anticoagulants: Secondary | ICD-10-CM | POA: Diagnosis not present

## 2017-03-10 DIAGNOSIS — I358 Other nonrheumatic aortic valve disorders: Secondary | ICD-10-CM | POA: Diagnosis not present

## 2017-03-10 DIAGNOSIS — R41844 Frontal lobe and executive function deficit: Secondary | ICD-10-CM

## 2017-03-10 DIAGNOSIS — R29818 Other symptoms and signs involving the nervous system: Secondary | ICD-10-CM

## 2017-03-10 DIAGNOSIS — R2689 Other abnormalities of gait and mobility: Secondary | ICD-10-CM

## 2017-03-10 DIAGNOSIS — R278 Other lack of coordination: Secondary | ICD-10-CM

## 2017-03-10 DIAGNOSIS — R29898 Other symptoms and signs involving the musculoskeletal system: Secondary | ICD-10-CM

## 2017-03-10 DIAGNOSIS — R2681 Unsteadiness on feet: Secondary | ICD-10-CM

## 2017-03-10 DIAGNOSIS — R41842 Visuospatial deficit: Secondary | ICD-10-CM

## 2017-03-10 NOTE — Therapy (Signed)
[facility] [address] [place], [place], [address] Phone: [phone]   Fax:  [phone]  Occupational Therapy Treatment  Patient Details  Name: [name] MRN: [id number] Date of Birth: 09/15/1941 Referring Provider: Dr. Wells Guiles Tat  Encounter Date: 03/10/2017      OT End of Session - 03/10/17 0818    Visit Number 9   Number of Visits 17   Date for OT Re-Evaluation 03/19/17   Authorization Type [employer], no visit limit/no auth, G-code needed   Authorization Time Period scheduled through 03/10/17 currently   Authorization - Visit Number 9   Authorization - Number of Visits 10   OT Start Time 0807   OT Stop Time 0847   OT Time Calculation (min) 40 min   Activity Tolerance Patient tolerated treatment well   Behavior During Therapy [facility] for tasks assessed/performed      Past Medical History:  Diagnosis Date  . Anticoagulated on Coumadin   . Arthropathy of cervical facet joint    C2-3  . At risk for falls   . BPV (benign positional vertigo)   . Bruises easily   . Cancer ([place]) 2004    breast-lumpectomy,nodes ,radiation,chemo  . Chronic neck pain   . CKD (chronic kidney disease), stage II   . Diverticulosis of colon   . Elevated troponin    a. 08/2016 in setting of PAF;  b. 08/2016 low risk MV w/ fixed septal defect (LBBB), EF 53%.  Marland Kitchen GERD (gastroesophageal reflux disease)   . Hiatal hernia   . History of aortic valve stenosis   . History of breast cancer per pt no recurrence   dx 2004-- Left breast DCIS (ER/PR+, HER2 negative) s/p  partial masectomy w/ sln bx and  chemoradiotion (completed 2004)  . History of colon polyps   . History of herpes zoster   . History of kidney stones   . History of kidney stones   . HTN (hypertension)   . Hyperlipidemia   . Hypertrophy of inter-atrial septum    a. 09/2014 Echo: ? of atrial mass; b. 10/2014 Cardiac MRI: no atrial septal mass - polypoid  lipomatous hypertrophy of superior atrial septum noted-->Benign.  . Irritable bowel syndrome   . LBBB (left bundle branch block)   . PAF (paroxysmal atrial fibrillation) (HCC)    a. CHA2DS2VASc = 4-->coumadin.  . Parkinsonism [facility])    neurologist-  dr tat  . PONV (postoperative nausea and vomiting)   . PVC's (premature ventricular contractions)   . Right ureteral stone   . S/P aortic valve replacement with prosthetic valve    a. 04/13/2006 s/p St Jude mechnical AVR for severe AS;  b. 09/2014 Echo: EF 40-45%, gr1 DD, mild AI/MR, triv TR/PR.  . SUI (stress urinary incontinence, female)   . Unstable balance    uses cane  . Venous varices      Past Surgical History:  Procedure Laterality Date  . ANTERIOR AND POSTERIOR REPAIR  1990   cystocele/ rectocele  . AORTIC VALVE REPLACEMENT  04/13/06   dr gerhardt   and Replacement Ascending Aorta (St. Jude mechanical prosthesis)  . APPENDECTOMY  child 1950  . BREAST SURGERY Left 2004  . CARDIAC CATHETERIZATION  02-22-2001   dr Martinique   minimal non-obstructive cad/  mild aortic stenosis and aortic root enlargement/  normal LVF, ef 65%  . CARDIAC CATHETERIZATION  03-22-2006    dr Martinique   no significant obstructive cad/  severe aortic stenosis  and mild to moderate aortic root enlargement/  upper normal right heart pressures  . CARDIOVASCULAR STRESS TEST  09-30-2011   dr Martinique   lexiscan nuclear study w/ apical thinning  vs  small prior apical infarct w/ no significant ischemia/  hypokinesis of the distal septum and apex, ef 50%  . CARPAL TUNNEL RELEASE Bilateral left 09-09-2004/  right 2007  . CATARACT EXTRACTION W/ INTRAOCULAR LENS  IMPLANT, BILATERAL  2015  . COLONOSCOPY N/A 10/03/2014   Procedure: COLONOSCOPY;  Surgeon: [name], MD;  Location: [facility];  Service: Endoscopy;  Laterality: N/A;  . CYSTOSCOPY WITH RETROGRADE PYELOGRAM, URETEROSCOPY AND STENT PLACEMENT Right 09/17/2016   Procedure: CYSTOSCOPY WITH RIGHT RETROGRADE  PYELOGRAM, RIGHT URETEROSCOPY AND STENT PLACEMENT LASER LITHOTRIPSY, RIGHT STENT PLACEMENT;  Surgeon: [name], MD;  Location: [facility];  Service: Urology;  Laterality: Right;  . ESOPHAGEAL MANOMETRY N/A 08/12/2014   Procedure: ESOPHAGEAL MANOMETRY (EM);  Surgeon: [name], MD;  Location: [facility];  Service: Endoscopy;  Laterality: N/A;  . ESOPHAGOGASTRODUODENOSCOPY N/A 10/03/2014   Procedure: ESOPHAGOGASTRODUODENOSCOPY (EGD);  Surgeon: [name], MD;  Location: [facility] ENDOSCOPY;  Service: Endoscopy;  Laterality: N/A;  . [place]  . FOOT SURGERY Right 2013   hammertoe x2  . HOLMIUM LASER APPLICATION Right 9/35/7017   Procedure: HOLMIUM LASER APPLICATION;  Surgeon: [name], MD;  [facility];  Service: Urology;  Laterality: Right;  . PERCUTANEOUS NEPHROSTOLITHOTOMY  1993  . STONE EXTRACTION WITH BASKET Right 09/17/2016   Procedure: STONE EXTRACTION WITH BASKET;  Surgeon: [name], MD;  Location: [facility];  Service: Urology;  Laterality: Right;  . TOTAL ABDOMINAL HYSTERECTOMY  1971   w/ Bilateral Salpingoophorectomy  . TRANSTHORACIC ECHOCARDIOGRAM  10/24/2014   dr Martinique   hypokinesis of the basal and mid inferoseptal and inferior walls,  ef 40-45%,  grade 1 diastolic dysfunction/  mechanical bileaflet aortic valve noted w/ mild regur., peak grandient 8mHg, valve area 1.35cm^2/  severe MV calcification without stenosis w/ mild regurg., peak gradient 311mg/  mild LAE/  poss. atrial mass (MRI showed benign lipomatous hypertrophy of atrial septum) /tFrazier ButtR/mild TR    There were no vitals filed for this visit.      Subjective Assessment - 03/10/17 0817    Subjective  Pt reports that she has no questions about exercises   Pertinent History PSP/Parkinsonism diagnosis 03/2016; hx L breast CA (s/p chemo, radiation, mastectomy,and reconstruction), hx of multiple  falls, aortic valve replacement, HTN, a-fib, GERD (see epic for details).   Limitations fall risk   Patient Stated Goals improve ADLs, writing   Currently in Pain? No/denies   Pain Onset Yesterday        In sitting, functional reaching with each UE to place small pegs in vertical pegboard to copy design with set-up for large amplitude movements and coordination, min difficulty with coordination.  Copying design with min-mod error for cognitive/visual-perceptual challenging and min-mod cueing to correct.   In standing functional diagonal step and reach with each UE/LE for improved safety/balance while flipping large cards with PWR! Hands with min-mod cueing for step and incr movement amplitude.  Checked remaining goals (see goals section below) and discussed follow-up with re-eval of OT, PT, ST in approx 6 months.  Pt agreed.  Pt educated regarding indications for return to therapy (notify Dr. TaCarles Colletprior to re-evaluation, particularly in regards to PSP.  Pt verbalized understanding.  In sitting, functional reaching to toss scarves with wt. Shift, trunk rotation, and PWR! Hands with min cueing.                 OT Education - 03/10/17 0817    Education Details Reviewed [facility] Info   Person(s) Educated Patient   Methods Explanation;Handout   Comprehension Verbalized understanding          OT Short Term Goals - 03/08/17 1405      OT SHORT TERM GOAL #1   Title Pt will be independent with updated HEP.--check STGs 02/19/17   Time 4   Period Weeks   Status Achieved  02/23/17  needs reinforcement/cueing for large amplitude; 03/08/17     OT SHORT TERM GOAL #2   Title Pt will report improved ability to cut food and decr spills when eating with adaptive strategies.   Time 4   Period Weeks   Status Achieved  02/22/17  per pt report     OT SHORT TERM GOAL #3   Title Pt will improve functional reaching/coordination for ADLs as shown by  improving score on box and blocks test by at least 4 with dominant RUE.   Baseline R-40 blocks   Time 4   Period Weeks   Status Not Met  02/22/17  R-40 blocks;  03/08/17:  40 blocks, 42 blocks     OT SHORT TERM GOAL #4   Title Pt will write at least 2 sentences/grocery list with at least 75% legibility and only min decr in size.   Baseline 60% legibility, mod micrographia   Time 4   Period Weeks   Status Achieved  02/22/17  approx 90% legibility and min decr in size     OT SHORT TERM GOAL #5   Title ---------------           OT Long Term Goals - 03/10/17 0820      OT LONG TERM GOAL #1   Title Pt will verbalize understanding of adaptive strategies/AE for ADLs/IADLs to incr safety/ease/independence (including eating, buttoning, styling hair, writing, and sitting on toilet).--check LTGs 03/19/17   Time 8   Period Weeks   Status Achieved     OT LONG TERM GOAL #2   Title Pt will improve coordination for ADLs as shown by reducing time on 9-hole peg test by at least 4 sec with dominant RUE.   Baseline R-28.47sec   Time 8   Period Weeks   Status Partially Met  03/08/17:  Not consistent  30.35sec, 21.88sec     OT LONG TERM GOAL #3   Title Pt will improve coordination/functional reaching as shown by improving box and blocks test by at least 8 with dominant RUE.   Baseline R-40, L-48 blocks   Time 8   Period Weeks   Status Not Met  03/08/17:  40, 42 blocks     OT LONG TERM GOAL #4   Title Pt will improve functional reaching/balance for IADLs as shown by improving standing functional reach by at least 1" with LUE.   Baseline R-11", L-9"   Time 8   Period Weeks   Status Partially Met  03/10/17:  inconsistent 9-10"     OT LONG TERM GOAL #5   Title Pt will write at least 2 sentences/grocery list with at least 95% legibility and only min decr in size.   Baseline approx 60% legibility with mod micrographia   Time 8   Period Weeks   Status  Achieved  02/22/17  met initial goal of  90% and upgraded.  03/03/17  met at approx this level               Plan - 2017/03/14 0819    Clinical Impression Statement Pt demo good overall progress and reports incr ease with ADLs.   Rehab Potential Good   OT Frequency 2x / week   OT Duration 8 weeks   OT Treatment/Interventions Self-care/ADL training;Therapeutic exercise;Cognitive remediation/compensation;Visual/perceptual remediation/compensation;Neuromuscular education;Moist Heat;Parrafin;Ultrasound;Cryotherapy;Fluidtherapy;Therapist, nutritional;Therapeutic exercises;Patient/family education;Balance training;Therapeutic activities;Manual Therapy;Passive range of motion;DME and/or AE instruction;Energy conservation;Splinting   Plan d/c OT, schedule OT eval/screen in approx 6 months   OT Home Exercise Plan Education provided:  reviewed PWR! sitting (basic 4), strategies for cutting/ eating, PWR! moves in supine (with modification)   Consulted and Agree with Plan of Care Patient      Patient will benefit from skilled therapeutic intervention in order to improve the following deficits and impairments:  Decreased balance, Decreased knowledge of precautions, Decreased safety awareness, Impaired UE functional use, Impaired tone, Improper body mechanics, Decreased mobility, Decreased coordination, Decreased cognition, Decreased knowledge of use of DME, Impaired vision/preception  Visit Diagnosis: Other symptoms and signs involving the nervous system  Other symptoms and signs involving the musculoskeletal system  Other lack of coordination  Frontal lobe and executive function deficit  Unsteadiness on feet  Other abnormalities of gait and mobility  Visuospatial deficit      G-Codes - March 14, 2017 1311    Functional Assessment Tool Used (Outpatient only) 9-hole peg test:  R-30.35, 21.88sec.  Box and blocks test:  R-42 blocks.  Standing functional reach test:  L-9-10", writing with 95% legibility and mod micrographia    Functional Limitation Carrying, moving and handling objects   Carrying, Moving and Handling Objects Goal Status ([id number]) At least 1 percent but less than 20 percent impaired, limited or restricted   Carrying, Moving and Handling Objects Discharge Status [phone]) At least 1 percent but less than 20 percent impaired, limited or restricted      Problem List Patient Active Problem List   Diagnosis Date Noted  . Elevated troponin 09/19/2016  . Emesis 09/19/2016  . UTI (urinary tract infection) 09/19/2016  . Sinus bradycardia 09/16/2016  . Family history of colon cancer 07/30/2014  . Hiatal hernia 07/30/2014  . Chest pain 10/13/2011  . Disequilibrium 10/13/2011  . Heart valve replaced by other means 04/16/2011  . Chronic anticoagulation 03/17/2011  . S/P AVR (aortic valve replacement)   . PVC's (premature ventricular contractions)   . HTN (hypertension)   . Hypercholesteremia   . Atrial fibrillation with RVR ([place])   . GERD (gastroesophageal reflux disease)   . GERD 02/06/2010  . DIVERTICULOSIS-COLON 02/06/2010  . PERSONAL HX COLONIC POLYPS 02/06/2010  . MASTECTOMY, HX OF 02/06/2010    OCCUPATIONAL THERAPY DISCHARGE SUMMARY  Visits from Start of Care: see above  Current functional level related to goals / functional outcomes: See above   Remaining deficits: See above   Education / Equipment: Pt was instructed in the following:  Updated PD/PSP-specific HEP, adaptive strategies for ADLs/IADLs, ways to prevent future complications, appropriate community resources.  Pt verbalized understanding of all education provided.    Plan: Patient agrees to discharge.  Patient goals were partially met. Patient is being discharged due to  Reaching maximal rehab potential at this time/pt pleased with progress.  Pt would benefit from re-evaluation in approx 6 months to assess for need for further therapy/functional changes due to progressive nature  of diagnosis. ?????        [facility] 03/10/2017, 1:24 PM  [facility] [address] [place], [place], [address] Phone: [phone]   Fax:  [phone]  Name: [name] MRN: [id number] Date of Birth: February 23, 1941   Vianne Bulls, OTR/L [facility] [address] [place], [place]  [address] [phone] phone [phone] 03/10/17 1:24 PM

## 2017-03-10 NOTE — Therapy (Signed)
[facility] [address] [place], [place], [address] Phone: [phone]   Fax:  [phone]  Speech Language Pathology Treatment  Patient Details  Name: [name] MRN: [id number] Date of Birth: 12-18-1941 Referring Provider: Alonza Bogus DO  Encounter Date: 03/10/2017      End of Session - 03/10/17 0924    Visit Number 9   Number of Visits 17   Date for SLP Re-Evaluation 03/25/17   SLP Start Time 0850   SLP Stop Time  0930   SLP Time Calculation (min) 40 min   Activity Tolerance Patient tolerated treatment well      Past Medical History:  Diagnosis Date  . Anticoagulated on Coumadin   . Arthropathy of cervical facet joint    C2-3  . At risk for falls   . BPV (benign positional vertigo)   . Bruises easily   . Cancer ([place]) 2004    breast-lumpectomy,nodes ,radiation,chemo  . Chronic neck pain   . CKD (chronic kidney disease), stage II   . Diverticulosis of colon   . Elevated troponin    a. 08/2016 in setting of PAF;  b. 08/2016 low risk MV w/ fixed septal defect (LBBB), EF 53%.  Marland Kitchen GERD (gastroesophageal reflux disease)   . Hiatal hernia   . History of aortic valve stenosis   . History of breast cancer per pt no recurrence   dx 2004-- Left breast DCIS (ER/PR+, HER2 negative) s/p  partial masectomy w/ sln bx and  chemoradiotion (completed 2004)  . History of colon polyps   . History of herpes zoster   . History of kidney stones   . History of kidney stones   . HTN (hypertension)   . Hyperlipidemia   . Hypertrophy of inter-atrial septum    a. 09/2014 Echo: ? of atrial mass; b. 10/2014 Cardiac MRI: no atrial septal mass - polypoid lipomatous hypertrophy of superior atrial septum noted-->Benign.  . Irritable bowel syndrome   . LBBB (left bundle branch block)   . PAF (paroxysmal atrial fibrillation) (HCC)    a. CHA2DS2VASc = 4-->coumadin.  . Parkinsonism [facility])    neurologist-  dr tat  . PONV  (postoperative nausea and vomiting)   . PVC's (premature ventricular contractions)   . Right ureteral stone   . S/P aortic valve replacement with prosthetic valve    a. 04/13/2006 s/p St Jude mechnical AVR for severe AS;  b. 09/2014 Echo: EF 40-45%, gr1 DD, mild AI/MR, triv TR/PR.  . SUI (stress urinary incontinence, female)   . Unstable balance    uses cane  . Venous varices      Past Surgical History:  Procedure Laterality Date  . ANTERIOR AND POSTERIOR REPAIR  1990   cystocele/ rectocele  . AORTIC VALVE REPLACEMENT  04/13/06   dr gerhardt   and Replacement Ascending Aorta (St. Jude mechanical prosthesis)  . APPENDECTOMY  child 1950  . BREAST SURGERY Left 2004  . CARDIAC CATHETERIZATION  02-22-2001   dr Martinique   minimal non-obstructive cad/  mild aortic stenosis and aortic root enlargement/  normal LVF, ef 65%  . CARDIAC CATHETERIZATION  03-22-2006    dr Martinique   no significant obstructive cad/  severe aortic stenosis and mild to moderate aortic root enlargement/  upper normal right heart pressures  . CARDIOVASCULAR STRESS TEST  09-30-2011   dr Martinique   lexiscan nuclear study w/ apical thinning  vs  small prior apical infarct w/ no significant ischemia/  hypokinesis of the distal septum and apex, ef 50%  . CARPAL TUNNEL RELEASE Bilateral left 09-09-2004/  right 2007  . CATARACT EXTRACTION W/ INTRAOCULAR LENS  IMPLANT, BILATERAL  2015  . COLONOSCOPY N/A 10/03/2014   Procedure: COLONOSCOPY;  Surgeon: [name], MD;  Location: [facility];  Service: Endoscopy;  Laterality: N/A;  . CYSTOSCOPY WITH RETROGRADE PYELOGRAM, URETEROSCOPY AND STENT PLACEMENT Right 09/17/2016   Procedure: CYSTOSCOPY WITH RIGHT RETROGRADE PYELOGRAM, RIGHT URETEROSCOPY AND STENT PLACEMENT LASER LITHOTRIPSY, RIGHT STENT PLACEMENT;  Surgeon: [name], MD;  Location: [facility];  Service: Urology;  Laterality: Right;  . ESOPHAGEAL MANOMETRY N/A 08/12/2014   Procedure: ESOPHAGEAL MANOMETRY  (EM);  Surgeon: [name], MD;  [facility];  Service: Endoscopy;  Laterality: N/A;  . ESOPHAGOGASTRODUODENOSCOPY N/A 10/03/2014   Procedure: ESOPHAGOGASTRODUODENOSCOPY (EGD);  Surgeon: [name], MD;  Location: [facility] END[facility]COPY;  Service: Endoscopy;  Laterality: N/A;  . [place]  . FOOT SURGERY Right 2013   hammertoe x2  . HOLMIUM LASER APPLICATION Right 6/43/8381   Procedure: HOLMIUM LASER APPLICATION;  Surgeon: [name], MD;  Location: [facility];  Service: Urology;  Laterality: Right;  . PERCUTANEOUS NEPHROSTOLITHOTOMY  1993  . STONE EXTRACTION WITH BASKET Right 09/17/2016   Procedure: STONE EXTRACTION WITH BASKET;  Surgeon: [name], MD;  Location: [facility];  Service: Urology;  Laterality: Right;  . TOTAL ABDOMINAL HYSTERECTOMY  1971   w/ Bilateral Salpingoophorectomy  . TRANSTHORACIC ECHOCARDIOGRAM  10/24/2014   dr Martinique   hypokinesis of the basal and mid inferoseptal and inferior walls,  ef 40-45%,  grade 1 diastolic dysfunction/  mechanical bileaflet aortic valve noted w/ mild regur., peak grandient 61mHg, valve area 1.35cm^2/  severe MV calcification without stenosis w/ mild regurg., peak gradient 317mg/  mild LAE/  poss. atrial mass (MRI showed benign lipomatous hypertrophy of atrial septum) /tFrazier ButtR/mild TR    There were no vitals filed for this visit.      Subjective Assessment - 03/10/17 0853    Subjective "I hope I can be done soon." (Pt graduated from PT and hoping to finish ST soon.)   Currently in Pain? No/denies               ADULT SLP TREATMENT - 03/10/17 0905      General Information   Behavior/Cognition Alert;Cooperative;Pleasant mood   Patient Positioning Upright in chair     Treatment Provided   Treatment provided Cognitive-Linquistic     Pain Assessment   Pain Assessment No/denies pain     Cognitive-Linquistic Treatment   Treatment focused  on Dysarthria;Voice   Skilled Treatment SLP facilitated pt's loudness recalibration with loud /a/ average 95dB (30 cm from sound source). In multi-paragraph level reading pt maintained 73.4dB average with rare cues to reduce word per breath group. In 20 min conversation pt also maintained 70.9dB, with rare min A to take full breaths and not talk on residual volume.  Rushes of speech occurred x4 during conversation. Demonstrated awareness of speaking on residual volume x1, stating "that was too low."     Assessment / Recommendations / Plan   Plan Continue with current plan of care     Progression Toward Goals   Progression toward goals Progressing toward goals          SLP Education - 03/10/17 0923    Education provided Yes   Education Details avoiding speaking on residual volume, increase  depth, frequency of breath   Person(s) Educated Patient   Methods Explanation;Demonstration;Verbal cues   Comprehension Verbalized understanding          SLP Short Term Goals - 03/10/17 0926      SLP SHORT TERM GOAL #1   Title pt will produce loud /a/ with proper voicing at average low 90's dB over three sessions   Baseline 02-24-17, 3-6-218, 03/03/17   Time 3   Period Weeks   Status Achieved     SLP SHORT TERM GOAL #2   Title pt will converse for 10 minutes re: simple topic with appropriate breath support for average volume in low 70s dB.   Time 3   Period Weeks   Status Achieved     SLP SHORT TERM GOAL #3   Title pt will exhibit no greater than 2 episodes of rushing of speech in 10 minutes simple conversation over three sessions   Baseline 03-01-17   Time 3   Period Weeks   Status Partially Met          SLP Long Term Goals - 03/10/17 1610      SLP LONG TERM GOAL #1   Title pt will demo no more than one episode of rushing of speech in 15 minutes mod complex conversation over two sessions   Time 3   Period Weeks   Status On-going     SLP LONG TERM GOAL #2   Title pt will achieve  15 minutes mod complex conversation with appropriate breath support to allow no > 1 instance of speaking on residual air   Time 3   Period Weeks   Status On-going     SLP LONG TERM GOAL #3   Title pt will sustain /a/ at average low 90s dB over 6 sessions   Baseline 3-1, 3-6, 3-8, 3-13   Time 3   Period Weeks   Status On-going          Plan - 03/10/17 0924    Clinical Impression Statement Pt maintained WNL loudness in sentence, multisentence and conversation today, with most frequent cues necessary in conversation; short rushes of speech x4 today, though demonstrated improved self-monitoring today. Continue skilled ST to maximize intelligiblity.    Speech Therapy Frequency 2x / week   Duration Other (comment)   Treatment/Interventions Compensatory strategies;Functional tasks;Patient/family education;Cueing hierarchy;Internal/external aids;SLP instruction and feedback   Potential to [place] continue as instructed   Consulted and Agree with Plan of Care Patient      Patient will benefit from skilled therapeutic intervention in order to improve the following deficits and impairments:   Dysarthria and anarthria    Problem List Patient Active Problem List   Diagnosis Date Noted  . Elevated troponin 09/19/2016  . Emesis 09/19/2016  . UTI (urinary tract infection) 09/19/2016  . Sinus bradycardia 09/16/2016  . Family history of colon cancer 07/30/2014  . Hiatal hernia 07/30/2014  . Chest pain 10/13/2011  . Disequilibrium 10/13/2011  . Heart valve replaced by other means 04/16/2011  . Chronic anticoagulation 03/17/2011  . S/P AVR (aortic valve replacement)   . PVC's (premature ventricular contractions)   . HTN (hypertension)   . Hypercholesteremia   . Atrial fibrillation with RVR ([place])   . GERD (gastroesophageal reflux disease)   . GERD 02/06/2010  . DIVERTICULOSIS-COLON 02/06/2010  . PERSONAL HX COLONIC POLYPS 02/06/2010  . MASTECTOMY,  HX OF 02/06/2010   Deneise Lever, MS CF-SLP Speech-Language Pathologist  Aliene Altes 03/10/2017, 9:31 AM  [facility] [address] [place], [place], [address] Phone: [phone]   Fax:  [phone]   Name: MIKEILA BURGEN MRN: [id number] Date of Birth: 07-26-1941

## 2017-03-14 ENCOUNTER — Ambulatory Visit: Payer: PPO | Admitting: Neurology

## 2017-03-15 ENCOUNTER — Encounter: Payer: PPO | Admitting: Occupational Therapy

## 2017-03-17 ENCOUNTER — Encounter: Payer: PPO | Admitting: Occupational Therapy

## 2017-03-17 ENCOUNTER — Encounter: Payer: PPO | Admitting: Speech Pathology

## 2017-03-19 ENCOUNTER — Emergency Department (HOSPITAL_COMMUNITY)
Admission: EM | Admit: 2017-03-19 | Discharge: 2017-03-19 | Disposition: A | Discharge status: Home / Self Care | Payer: PPO | Attending: Emergency Medicine | Admitting: Emergency Medicine

## 2017-03-19 ENCOUNTER — Emergency Department (HOSPITAL_COMMUNITY): Payer: PPO

## 2017-03-19 ENCOUNTER — Encounter (HOSPITAL_COMMUNITY): Payer: Self-pay | Admitting: Emergency Medicine

## 2017-03-19 DIAGNOSIS — Y999 Unspecified external cause status: Secondary | ICD-10-CM | POA: Insufficient documentation

## 2017-03-19 DIAGNOSIS — W19XXXA Unspecified fall, initial encounter: Secondary | ICD-10-CM

## 2017-03-19 DIAGNOSIS — N182 Chronic kidney disease, stage 2 (mild): Secondary | ICD-10-CM | POA: Insufficient documentation

## 2017-03-19 DIAGNOSIS — Z853 Personal history of malignant neoplasm of breast: Secondary | ICD-10-CM | POA: Diagnosis not present

## 2017-03-19 DIAGNOSIS — M25552 Pain in left hip: Secondary | ICD-10-CM | POA: Diagnosis not present

## 2017-03-19 DIAGNOSIS — Y939 Activity, unspecified: Secondary | ICD-10-CM | POA: Insufficient documentation

## 2017-03-19 DIAGNOSIS — S0003XA Contusion of scalp, initial encounter: Secondary | ICD-10-CM | POA: Diagnosis not present

## 2017-03-19 DIAGNOSIS — S0990XA Unspecified injury of head, initial encounter: Secondary | ICD-10-CM | POA: Diagnosis not present

## 2017-03-19 DIAGNOSIS — Z7901 Long term (current) use of anticoagulants: Secondary | ICD-10-CM | POA: Insufficient documentation

## 2017-03-19 DIAGNOSIS — Y929 Unspecified place or not applicable: Secondary | ICD-10-CM | POA: Insufficient documentation

## 2017-03-19 DIAGNOSIS — W1830XA Fall on same level, unspecified, initial encounter: Secondary | ICD-10-CM | POA: Insufficient documentation

## 2017-03-19 DIAGNOSIS — S79912A Unspecified injury of left hip, initial encounter: Secondary | ICD-10-CM | POA: Diagnosis not present

## 2017-03-19 DIAGNOSIS — I129 Hypertensive chronic kidney disease with stage 1 through stage 4 chronic kidney disease, or unspecified chronic kidney disease: Secondary | ICD-10-CM | POA: Insufficient documentation

## 2017-03-19 NOTE — ED Notes (Signed)
Taken to xray.

## 2017-03-19 NOTE — Discharge Instructions (Signed)
As discussed, it is normal to feel worse in the days immediately following a fall regardless of medication use. ° °However, please take all medication as directed, use ice packs liberally.  If you develop any new, or concerning changes in your condition, please return here for further evaluation and management.   ° °Otherwise, please return followup with your physician ° ° ° °

## 2017-03-19 NOTE — ED Notes (Signed)
Back from xray

## 2017-03-19 NOTE — ED Provider Notes (Signed)
[place] DEPT Provider Note   CSN: [id number] Arrival date & time: 03/19/17  1746     History   Chief Complaint Chief Complaint  Patient presents with  . Fall  . Head Injury    HPI [name] is a 76 y.o. female.  HPI  Patient presents after mechanical fall. The patient was at home with her husband, regular. Patient stumbled, fell backward striking her head. No loss of consciousness. Since the event the patient has had pain in the occiput, sore, nonradiating. No confusion, no disorientation, no vision changes. Patient also has mild left lateral hip pain, though she has been ambulatory, since the fall. The pain in the hip is sore, moderate, worse with ambulation or pressure. No distal loss of sensation is present Part of the fall the patient was in her usual state of health. She denies any recent medication changes, diet changes, activity changes.   Past Medical History:  Diagnosis Date  . Anticoagulated on Coumadin   . Arthropathy of cervical facet joint    C2-3  . At risk for falls   . BPV (benign positional vertigo)   . Bruises easily   . Cancer ([place]) 2004    breast-lumpectomy,nodes ,radiation,chemo  . Chronic neck pain   . CKD (chronic kidney disease), stage II   . Diverticulosis of colon   . Elevated troponin    a. 08/2016 in setting of PAF;  b. 08/2016 low risk MV w/ fixed septal defect (LBBB), EF 53%.  Marland Kitchen GERD (gastroesophageal reflux disease)   . Hiatal hernia   . History of aortic valve stenosis   . History of breast cancer per pt no recurrence   dx 2004-- Left breast DCIS (ER/PR+, HER2 negative) s/p  partial masectomy w/ sln bx and  chemoradiotion (completed 2004)  . History of colon polyps   . History of herpes zoster   . History of kidney stones   . History of kidney stones   . HTN (hypertension)   . Hyperlipidemia   . Hypertrophy of inter-atrial septum    a. 09/2014 Echo: ? of atrial mass; b. 10/2014 Cardiac MRI: no atrial septal mass -  polypoid lipomatous hypertrophy of superior atrial septum noted-->Benign.  . Irritable bowel syndrome   . LBBB (left bundle branch block)   . PAF (paroxysmal atrial fibrillation) (HCC)    a. CHA2DS2VASc = 4-->coumadin.  . Parkinsonism [facility])    neurologist-  dr tat  . PONV (postoperative nausea and vomiting)   . PVC's (premature ventricular contractions)   . Right ureteral stone   . S/P aortic valve replacement with prosthetic valve    a. 04/13/2006 s/p St Jude mechnical AVR for severe AS;  b. 09/2014 Echo: EF 40-45%, gr1 DD, mild AI/MR, triv TR/PR.  . SUI (stress urinary incontinence, female)   . Unstable balance    uses cane  . Venous varices      Patient Active Problem List   Diagnosis Date Noted  . Elevated troponin 09/19/2016  . Emesis 09/19/2016  . UTI (urinary tract infection) 09/19/2016  . Sinus bradycardia 09/16/2016  . Family history of colon cancer 07/30/2014  . Hiatal hernia 07/30/2014  . Chest pain 10/13/2011  . Disequilibrium 10/13/2011  . Heart valve replaced by other means 04/16/2011  . Chronic anticoagulation 03/17/2011  . S/P AVR (aortic valve replacement)   . PVC's (premature ventricular contractions)   . HTN (hypertension)   . Hypercholesteremia   . Atrial fibrillation with RVR ([place])   .  GERD (gastroesophageal reflux disease)   . GERD 02/06/2010  . DIVERTICULOSIS-COLON 02/06/2010  . PERSONAL HX COLONIC POLYPS 02/06/2010  . MASTECTOMY, HX OF 02/06/2010    Past Surgical History:  Procedure Laterality Date  . ANTERIOR AND POSTERIOR REPAIR  1990   cystocele/ rectocele  . AORTIC VALVE REPLACEMENT  04/13/06   dr gerhardt   and Replacement Ascending Aorta (St. Jude mechanical prosthesis)  . APPENDECTOMY  child 1950  . BREAST SURGERY Left 2004  . CARDIAC CATHETERIZATION  02-22-2001   dr Martinique   minimal non-obstructive cad/  mild aortic stenosis and aortic root enlargement/  normal LVF, ef 65%  . CARDIAC CATHETERIZATION  03-22-2006    dr Martinique   no  significant obstructive cad/  severe aortic stenosis and mild to moderate aortic root enlargement/  upper normal right heart pressures  . CARDIOVASCULAR STRESS TEST  09-30-2011   dr Martinique   lexiscan nuclear study w/ apical thinning  vs  small prior apical infarct w/ no significant ischemia/  hypokinesis of the distal septum and apex, ef 50%  . CARPAL TUNNEL RELEASE Bilateral left 09-09-2004/  right 2007  . CATARACT EXTRACTION W/ INTRAOCULAR LENS  IMPLANT, BILATERAL  2015  . COLONOSCOPY N/A 10/03/2014   Procedure: COLONOSCOPY;  Surgeon: [name], MD;  Location: [place] [facility];  Service: Endoscopy;  Laterality: N/A;  . CYSTOSCOPY WITH RETROGRADE PYELOGRAM, URETEROSCOPY AND STENT PLACEMENT Right 09/17/2016   Procedure: CYSTOSCOPY WITH RIGHT RETROGRADE PYELOGRAM, RIGHT URETEROSCOPY AND STENT PLACEMENT LASER LITHOTRIPSY, RIGHT STENT PLACEMENT;  Surgeon: [name], MD;  Location: [facility];  Service: Urology;  Laterality: Right;  . ESOPHAGEAL MANOMETRY N/A 08/12/2014   Procedure: ESOPHAGEAL MANOMETRY (EM);  Surgeon: [name], MD;  Location: [facility];  Service: Endoscopy;  Laterality: N/A;  . ESOPHAGOGASTRODUODENOSCOPY N/A 10/03/2014   Procedure: ESOPHAGOGASTRODUODENOSCOPY (EGD);  Surgeon: [name], MD;  Location: [facility] ENDOSCOPY;  Service: Endoscopy;  Laterality: N/A;  . [place]  . FOOT SURGERY Right 2013   hammertoe x2  . HOLMIUM LASER APPLICATION Right 03/18/5572   Procedure: HOLMIUM LASER APPLICATION;  Surgeon: [name], MD;  Location[facility];  Service: Urology;  Laterality: Right;  . PERCUTANEOUS NEPHROSTOLITHOTOMY  1993  . STONE EXTRACTION WITH BASKET Right 09/17/2016   Procedure: STONE EXTRACTION WITH BASKET;  Surgeon: [name], MD;  Location: [facility];  Service: Urology;  Laterality: Right;  . TOTAL ABDOMINAL HYSTERECTOMY  1971   w/ Bilateral Salpingoophorectomy    . TRANSTHORACIC ECHOCARDIOGRAM  10/24/2014   dr Martinique   hypokinesis of the basal and mid inferoseptal and inferior walls,  ef 40-45%,  grade 1 diastolic dysfunction/  mechanical bileaflet aortic valve noted w/ mild regur., peak grandient 10mHg, valve area 1.35cm^2/  severe MV calcification without stenosis w/ mild regurg., peak gradient 327mg/  mild LAE/  poss. atrial mass (MRI showed benign lipomatous hypertrophy of atrial septum) /tFrazier ButtR/mild TR    OB History    No data available       Home Medications    Prior to Admission medications   Medication Sig Start Date End Date Taking? Authorizing Provider  calcium citrate-vitamin D (CITRACAL+D) 315-200 MG-UNIT per tablet Take 1 tablet by mouth 3 (three) times daily.    Historical Provider, MD  carbidopa-levodopa (SINEMET IR) 25-100 MG tablet TAKE TWO TABLETS BY MOUTH THREE TIMES DAILY 12/07/16   ReEustace Quailat, DO  Cholecalciferol (VITAMIN D3) 2000 units  TABS Take 1 capsule by mouth daily.    Historical Provider, MD  famotidine-calcium carbonate-magnesium hydroxide (PEPCID COMPLETE) 10-800-165 MG CHEW chewable tablet Chew 1 tablet by mouth daily as needed (heartburn).    Historical Provider, MD  Multiple Vitamin (MULTIVITAMIN) tablet Take 1 tablet by mouth daily at 12 noon.     Historical Provider, MD  Multiple Vitamins-Minerals (VISION-VITE PRESERVE PO) Take 1 tablet by mouth 2 (two) times daily.    Historical Provider, MD  pantoprazole (PROTONIX) 40 MG tablet Take 40 mg by mouth every morning.     Historical Provider, MD  simvastatin (ZOCOR) 20 MG tablet Take 20 mg by mouth every evening.    Historical Provider, MD  triamterene-hydrochlorothiazide (MAXZIDE-25) 37.5-25 MG per tablet Take 1 tablet by mouth every morning.     Historical Provider, MD  UNABLE TO FIND Take 900 mg by mouth daily at 12 noon. Omega Red     Historical Provider, MD  vitamin B-12 (CYANOCOBALAMIN) 1000 MCG tablet Take 1,000 mcg by mouth every morning.    Historical  Provider, MD  warfarin (COUMADIN) 5 MG tablet Take 5 mg by mouth every evening. 7.5 mg every , Thurs, Sat; 5 mg all other days    Historical Provider, MD    Family History Family History  Problem Relation Age of Onset  . Heart disease Mother   . Heart disease Father 59    CABG  . Other Brother     AVR    Social History Social History  Substance Use Topics  . Smoking status: Never Smoker  . Smokeless tobacco: Never Used  . Alcohol use No     Allergies   Demerol [meperidine]; Oxycodone; Adhesive [tape]; Ivp dye [iodinated diagnostic agents]; and Phenergan [promethazine]   Review of Systems Review of Systems  Constitutional:       Per HPI, otherwise negative  HENT:       Per HPI, otherwise negative  Respiratory:       Per HPI, otherwise negative  Cardiovascular:       Per HPI, otherwise negative  Gastrointestinal: Negative for vomiting.  Endocrine:       Negative aside from HPI  Genitourinary:       Neg aside from HPI   Musculoskeletal:       Per HPI, otherwise negative  Skin: Negative.   Allergic/Immunologic: Negative for immunocompromised state.  Neurological: Positive for weakness. Negative for syncope.       Parkinsons  Hematological: Bruises/bleeds easily.     Physical Exam Updated Vital Signs BP 127/64   Pulse 66   Temp 98.5 F (36.9 C) (Oral)   Resp 20   SpO2 99%   Physical Exam  Constitutional: She is oriented to person, place, and time. She appears well-developed and well-nourished. No distress.  HENT:  Head: Normocephalic and atraumatic.    Eyes: Conjunctivae and EOM are normal.  Neck: Full passive range of motion without pain. No spinous process tenderness and no muscular tenderness present. Normal range of motion present.  Cardiovascular: Normal rate and regular rhythm.   Pulmonary/Chest: Effort normal and breath sounds normal. No stridor. No respiratory distress.  Abdominal: She exhibits no distension.  Musculoskeletal: She exhibits no  edema.       Right hip: Normal.       Left hip: She exhibits tenderness and bony tenderness. She exhibits normal range of motion, normal strength, no swelling, no crepitus, no deformity and no laceration.  Neurological: She is alert and oriented to  person, place, and time. She displays atrophy. She displays no tremor. No cranial nerve deficit. She displays no seizure activity. Coordination normal.  Skin: Skin is warm and dry.  Psychiatric: She has a normal mood and affect.  Nursing note and vitals reviewed.    ED Treatments / Results   Radiology Ct Head Wo Contrast  Result Date: 03/19/2017 CLINICAL DATA:  Fall, hit her head, on Coumadin EXAM: CT HEAD WITHOUT CONTRAST TECHNIQUE: Contiguous axial images were obtained from the base of the skull through the vertex without intravenous contrast. COMPARISON:  08/28/2016 FINDINGS: Brain: No intracranial hemorrhage, mass effect or midline shift. No acute cortical infarction. Stable atrophy and chronic white matter disease. Ventricular size is stable from prior exam. No mass lesion is noted on this unenhanced scan. Vascular: Mild atherosclerotic calcifications of carotid siphon. Skull: No skull fracture is noted. Sinuses/Orbits: No acute findings Other: None IMPRESSION: No acute intracranial abnormality. Stable atrophy and chronic white matter disease. No definite acute cortical infarction. Electronically Signed   By: Lahoma Crocker M.D.   On: 03/19/2017 18:53   Dg Hip Unilat With Pelvis 2-3 Views Left  Result Date: 03/19/2017 CLINICAL DATA:  Fall, mild left hip pain EXAM: DG HIP (WITH OR WITHOUT PELVIS) 2-3V LEFT COMPARISON:  None. FINDINGS: Three views of the left hip submitted. No acute fracture or subluxation. There is diffuse osteopenia. There are degenerative changes pubic symphysis. IMPRESSION: No acute fracture or subluxation. Diffuse osteopenia. Degenerative changes pubic symphysis. Electronically Signed   By: Lahoma Crocker M.D.   On: 03/19/2017 20:27  I  reviewed the CT, and x-ray, agree with the interpretation.  Procedures Procedures (including critical care time)    Initial Impression / Assessment and Plan / ED Course  I have reviewed the triage vital signs and the nursing notes.  Pertinent labs & imaging results that were available during my care of the patient were reviewed by me and considered in my medical decision making (see chart for details).  On repeat exam patient is awake and alert, in no distress. I discussed all findings at length including CT, x-ray results With reassuring findings, no evidence for bleed, intracranial damage, neurologic compromise, fracture patient provider for discharge with outpatient follow-up.  Final Clinical Impressions(s) / ED Diagnoses  Fall, initial encounter Head trauma, initial encounter Hip pain, left, initial encounter   Carmin Muskrat, MD 03/19/17 2041

## 2017-03-19 NOTE — ED Triage Notes (Signed)
Pt. Stated, I fell back and hit my head and Im on coumadin, and I was told to come here.

## 2017-03-22 ENCOUNTER — Ambulatory Visit: Payer: PPO | Admitting: Neurology

## 2017-03-22 ENCOUNTER — Ambulatory Visit: Payer: PPO

## 2017-03-22 ENCOUNTER — Encounter: Payer: PPO | Admitting: Occupational Therapy

## 2017-03-22 DIAGNOSIS — R471 Dysarthria and anarthria: Secondary | ICD-10-CM

## 2017-03-22 NOTE — Therapy (Signed)
[facility] [address] [place], [place], [address] Phone: [phone]   Fax:  [phone]  Speech Language Pathology Treatment  Patient Details  Name: [name] MRN: [id number] Date of Birth: 1941-03-14 Referring Provider: Alonza Bogus DO  Encounter Date: 03/22/2017      End of Session - 03/22/17 1438    Visit Number 10   Number of Visits 17   Date for SLP Re-Evaluation 03/25/17   SLP Start Time 1404   SLP Stop Time  1439   SLP Time Calculation (min) 35 min   Activity Tolerance Patient tolerated treatment well      Past Medical History:  Diagnosis Date  . Anticoagulated on Coumadin   . Arthropathy of cervical facet joint    C2-3  . At risk for falls   . BPV (benign positional vertigo)   . Bruises easily   . Cancer ([place]) 2004    breast-lumpectomy,nodes ,radiation,chemo  . Chronic neck pain   . CKD (chronic kidney disease), stage II   . Diverticulosis of colon   . Elevated troponin    a. 08/2016 in setting of PAF;  b. 08/2016 low risk MV w/ fixed septal defect (LBBB), EF 53%.  Marland Kitchen GERD (gastroesophageal reflux disease)   . Hiatal hernia   . History of aortic valve stenosis   . History of breast cancer per pt no recurrence   dx 2004-- Left breast DCIS (ER/PR+, HER2 negative) s/p  partial masectomy w/ sln bx and  chemoradiotion (completed 2004)  . History of colon polyps   . History of herpes zoster   . History of kidney stones   . History of kidney stones   . HTN (hypertension)   . Hyperlipidemia   . Hypertrophy of inter-atrial septum    a. 09/2014 Echo: ? of atrial mass; b. 10/2014 Cardiac MRI: no atrial septal mass - polypoid lipomatous hypertrophy of superior atrial septum noted-->Benign.  . Irritable bowel syndrome   . LBBB (left bundle branch block)   . PAF (paroxysmal atrial fibrillation) (HCC)    a. CHA2DS2VASc = 4-->coumadin.  . Parkinsonism [facility])    neurologist-  dr tat  . PONV  (postoperative nausea and vomiting)   . PVC's (premature ventricular contractions)   . Right ureteral stone   . S/P aortic valve replacement with prosthetic valve    a. 04/13/2006 s/p St Jude mechnical AVR for severe AS;  b. 09/2014 Echo: EF 40-45%, gr1 DD, mild AI/MR, triv TR/PR.  . SUI (stress urinary incontinence, female)   . Unstable balance    uses cane  . Venous varices      Past Surgical History:  Procedure Laterality Date  . ANTERIOR AND POSTERIOR REPAIR  1990   cystocele/ rectocele  . AORTIC VALVE REPLACEMENT  04/13/06   dr gerhardt   and Replacement Ascending Aorta (St. Jude mechanical prosthesis)  . APPENDECTOMY  child 1950  . BREAST SURGERY Left 2004  . CARDIAC CATHETERIZATION  02-22-2001   dr Martinique   minimal non-obstructive cad/  mild aortic stenosis and aortic root enlargement/  normal LVF, ef 65%  . CARDIAC CATHETERIZATION  03-22-2006    dr Martinique   no significant obstructive cad/  severe aortic stenosis and mild to moderate aortic root enlargement/  upper normal right heart pressures  . CARDIOVASCULAR STRESS TEST  09-30-2011   dr Martinique   lexiscan nuclear study w/ apical thinning  vs  small prior apical infarct w/ no significant ischemia/  hypokinesis of the distal septum and apex, ef 50%  . CARPAL TUNNEL RELEASE Bilateral left 09-09-2004/  right 2007  . CATARACT EXTRACTION W/ INTRAOCULAR LENS  IMPLANT, BILATERAL  2015  . COLONOSCOPY N/A 10/03/2014   Procedure: COLONOSCOPY;  Surgeon: [name], MD;  Location: [facility];  Service: Endoscopy;  Laterality: N/A;  . CYSTOSCOPY WITH RETROGRADE PYELOGRAM, URETEROSCOPY AND STENT PLACEMENT Right 09/17/2016   Procedure: CYSTOSCOPY WITH RIGHT RETROGRADE PYELOGRAM, RIGHT URETEROSCOPY AND STENT PLACEMENT LASER LITHOTRIPSY, RIGHT STENT PLACEMENT;  Surgeon: [name], MD;  Location: [facility];  Service: Urology;  Laterality: Right;  . ESOPHAGEAL MANOMETRY N/A 08/12/2014   Procedure: ESOPHAGEAL MANOMETRY  (EM);  Surgeon: [name], MD;  Location: [facility];  Service: Endoscopy;  Laterality: N/A;  . ESOPHAGOGASTRODUODENOSCOPY N/A 10/03/2014   Procedure: ESOPHAGOGASTRODUODENOSCOPY (EGD);  Surgeon: [name], MD;  Location: [facility] ENDOSCOPY;  Service: Endoscopy;  Laterality: N/A;  . [place]  . FOOT SURGERY Right 2013   hammertoe x2  . HOLMIUM LASER APPLICATION Right 0/94/0768   Procedure: HOLMIUM LASER APPLICATION;  Surgeon: [name], MD;  Location: [facility];  Service: Urology;  Laterality: Right;  . PERCUTANEOUS NEPHROSTOLITHOTOMY  1993  . STONE EXTRACTION WITH BASKET Right 09/17/2016   Procedure: STONE EXTRACTION WITH BASKET;  Surgeon: [name], MD;  Location: [facility];  Service: Urology;  Laterality: Right;  . TOTAL ABDOMINAL HYSTERECTOMY  1971   w/ Bilateral Salpingoophorectomy  . TRANSTHORACIC ECHOCARDIOGRAM  10/24/2014   dr Martinique   hypokinesis of the basal and mid inferoseptal and inferior walls,  ef 40-45%,  grade 1 diastolic dysfunction/  mechanical bileaflet aortic valve noted w/ mild regur., peak grandient 48mHg, valve area 1.35cm^2/  severe MV calcification without stenosis w/ mild regurg., peak gradient 375mg/  mild LAE/  poss. atrial mass (MRI showed benign lipomatous hypertrophy of atrial septum) /tFrazier ButtR/mild TR    There were no vitals filed for this visit.      Subjective Assessment - 03/22/17 1412    Subjective "I was hoping to be done today."               ADULT SLP TREATMENT - 03/22/17 1413      General Information   Behavior/Cognition Alert;Cooperative;Pleasant mood     Treatment Provided   Treatment provided Cognitive-Linquistic     Dysphagia Treatment   Other treatment/comments Pt observed with water sips today x4 without overt s/s aspiration.  Pt denies, upon direct questioning, coughing, choking with meals.     Pain Assessment   Pain Assessment 0-10    Pain Score 3    Pain Location lt hip (fell on this Saturday 03-19-17)     Cognitive-Linquistic Treatment   Treatment focused on Dysarthria;Voice   Skilled Treatment Loud /a/ was used to recalibrate pt's conversational loudness; average 93dB. In 15 minutes of conversation pt had one rush of speech, and maintained 70dB-71dB average for the span of conversation. She was observed to talk on residual volume once, on the way [place] [facility].       Assessment / Recommendations / Plan   Plan --  d/c due to pt request     Progression Toward Goals   Progression toward goals --  see goal update          SLP Education - 03/22/17 1438    Education provided Yes   Education Details need to cont loud /  a/ daily as well as louder speech daily   Person(s) Educated Patient   Methods Explanation   Comprehension Verbalized understanding          SLP Short Term Goals - 04-06-2017 1410      SLP SHORT TERM GOAL #1   Title pt will produce loud /a/ with proper voicing at average low 90's dB over three sessions   Status Achieved     SLP SHORT TERM GOAL #2   Title pt will converse for 10 minutes re: simple topic with appropriate breath support for average volume in low 70s dB.   Time 3   Period Weeks   Status Achieved     SLP SHORT TERM GOAL #3   Title pt will exhibit no greater than 2 episodes of rushing of speech in 10 minutes simple conversation over three sessions   Baseline 03-01-17   Time --   Period --   Status Partially Met          SLP Long Term Goals - 2017/04/06 1411      SLP LONG TERM GOAL #1   Title pt will demo no more than one episode of rushing of speech in 15 minutes mod complex conversation over two sessions   Time --   Period --   Status Partially Met  in one session     SLP LONG TERM GOAL #2   Title pt will achieve 15 minutes mod complex conversation with appropriate breath support to allow no > 1 instance of speaking on residual air   Time --   Period --    Status Achieved     SLP LONG TERM GOAL #3   Title pt will sustain /a/ at average low 90s dB over 6 sessions   Baseline --   Time --   Period --   Status Partially Met  5 sessions          Plan - 04-06-2017 1453    Clinical Impression Statement Pt maintained WNL loudness in sentence, multisentence and conversation today. Short rush of speech once today. Pt requests discharge and SLP agreed with stressing to pt need to cont with loud /a/, and loud speech. Pt will return in 6 months for PT, OT, and ST screens.   Speech Therapy Frequency 2x / week   Duration --  8 weeks   Treatment/Interventions Compensatory strategies;Functional tasks;Patient/family education;Cueing hierarchy;Internal/external aids;SLP instruction and feedback   Potential to Achieve Goals Good      Patient will benefit from skilled therapeutic intervention in order to improve the following deficits and impairments:   Dysarthria and anarthria      G-Codes - 2017-04-06 1455    Functional Assessment Tool Used NOMS -10% impaired   Functional Limitations Motor speech   Motor Speech Goal Status [phone]) At least 1 percent but less than 20 percent impaired, limited or restricted   Motor Speech Goal Status (P2951) At least 1 percent but less than 20 percent impaired, limited or restricted     [place]  Visits from Start of Care: 10  Current functional level related to goals / functional outcomes: Pt made functional gains in loudness and decreasing speaking on residual volume and also in mitigating short rushes of speech. She reports now infrequently being asked to repeat herself. See goal update for details. She requested d/c today.    Remaining deficits: Mild dysarthria including rare short rushes of speech not hindering overall intelligibility.   Education / Equipment: Loud /  a/, need for abdominal activation with louder speech, proper breathing technique.  Plan: Patient agrees to discharge.   Patient goals were partially met. Patient is being discharged due to the patient's request.  ?????       Problem List Patient Active Problem List   Diagnosis Date Noted  . Elevated troponin 09/19/2016  . Emesis 09/19/2016  . UTI (urinary tract infection) 09/19/2016  . Sinus bradycardia 09/16/2016  . Family history of colon cancer 07/30/2014  . Hiatal hernia 07/30/2014  . Chest pain 10/13/2011  . Disequilibrium 10/13/2011  . Heart valve replaced by other means 04/16/2011  . Chronic anticoagulation 03/17/2011  . S/P AVR (aortic valve replacement)   . PVC's (premature ventricular contractions)   . HTN (hypertension)   . Hypercholesteremia   . Atrial fibrillation with RVR ([place])   . GERD (gastroesophageal reflux disease)   . GERD 02/06/2010  . DIVERTICULOSIS-COLON 02/06/2010  . PERSONAL HX COLONIC POLYPS 02/06/2010  . MASTECTOMY, HX OF 02/06/2010    [facility] ,MS, CCC-SLP  03/22/2017, 2:56 PM  [facility] [address] [place], [place], [address] Phone: [phone]   Fax:  [phone]   Name: [name] MRN: [id number] Date of Birth: 05/08/1941

## 2017-03-24 ENCOUNTER — Encounter: Payer: PPO | Admitting: Occupational Therapy

## 2017-03-24 ENCOUNTER — Encounter: Payer: PPO | Admitting: Speech Pathology

## 2017-03-29 ENCOUNTER — Encounter: Payer: PPO | Admitting: Occupational Therapy

## 2017-03-31 ENCOUNTER — Encounter: Payer: PPO | Admitting: Occupational Therapy

## 2017-04-05 DIAGNOSIS — R05 Cough: Secondary | ICD-10-CM | POA: Diagnosis not present

## 2017-04-05 DIAGNOSIS — I10 Essential (primary) hypertension: Secondary | ICD-10-CM | POA: Diagnosis not present

## 2017-04-05 DIAGNOSIS — Z6827 Body mass index (BMI) 27.0-27.9, adult: Secondary | ICD-10-CM | POA: Diagnosis not present

## 2017-04-05 NOTE — Progress Notes (Signed)
[name] was seen today in the movement disorders clinic for neurologic consultation at the request of Dr Tomi Likens for a 2nd opinion regarding gait instability.  She is accompanied by her husband who supplements the hx.  The records that were made available to me were reviewed.  Pt reports that she has been having falls, the first of which was several years ago but then she began to have more regular falls 6-8 months ago.  Most falls are when she turns and she notes that the feet will shuffle and the "body will turn and the feet will stay."  Her husband states that her feet will stay like "they are stuck on bumble gum."  No falls in last 2 weeks.  She has been in rehab recently.    07/06/16 update:  The patient is following up today regarding probable PSP.  Went to the atypical symposium I had recommended and they thought that it was very informative.  She had a modified barium swallow on 05/20/2016 that recommended a regular diet with thin liquids.  She has been participating with physical and occupational therapy since last visit.  I started her on levodopa (6am/12noon/5pm) and she has not noticed a great difference with that. No SE.  Has had 2 falls.  With one she tripped over her cane and with one she fell changing her sheets.   This was very low dose, and a benefit was not expected at this low dose.  She did see Dr. Rexene Alberts in 06/22/2016 for sleep apnea.  A sleep study was recommended.  Pt states that she hasn't scheduled that yet.  States that she had an injection in neck for pain 2 weeks ago and thinks that it has helped some.  States that she is contemplating cervical spine surgery.  She did have an MRI of the cervical spine on 06/03/2016 that demonstrated severe C2-C3 left facet arthropathy with joint effusion and bone marrow edema.  11/23/16 update:  Pt f/u today.  Last visit, I increased her carbidopa/levodopa 25/100 to 2 po tid.  She states that she noted no great difference with that.   Multiple falls over the last few weeks (5) for no reason.  One time she tripped over a threshold.  One time she fell in a bathroom.  One time she was working with the christmas tree.  All the falls except one were forward.   Unfortunately, the PSP study that was supposed to be done local had a site change and the closest site was [place], [place].  They also were told that she wasn't a good candidate because of the coumadin.  Had 2 kidney stones since last visit.  On oxybutinin.  Seeing urology this afternoon.  04/07/17 update:  Patient follows up today.  She is on carbidopa/levodopa 25/100, 2 tablets 3 times per day.  She has had 2   falls since our last visit.  She was in the emergency room on 03/19/2017 after she fell backward and hit her head.  She was stepping backwards to put trash in the trashcan.  She had the walker but was going backward.  I reviewed the emergency room notes.  I also reviewed her CT of the brain which was done that day.  It was unremarkable and demonstrated chronic white matter disease.  The other fall was in the bathroom and she was turning around to look at her hair.  She hit her bottom.  She has attended speech, occupational and physical therapy  since our last visit and those notes were reviewed.  She has had 1 day of lightheadedness but no near syncope.  She states that her mood has been good.  She is sleeping well.  She has had some intermittent diplopia.  PREVIOUS MEDICATIONS: none to date  ALLERGIES:   Allergies  Allergen Reactions  . Demerol [Meperidine] Shortness Of Breath and Swelling  . Oxycodone Other (See Comments)    Hallucinations   . Adhesive [Tape] Other (See Comments)    Redness and skin tears  . Ivp Dye [Iodinated Diagnostic Agents] Hives    OK with benadryl pre-med (42m one hour before receiving iodinated contrast agent)  . Phenergan [Promethazine] Other (See Comments)    Avoids due to reaction with current medications    CURRENT MEDICATIONS:    Outpatient Encounter Prescriptions as of 04/07/2017  Medication Sig  . amoxicillin (AMOXIL) 500 MG capsule   . benzonatate (TESSALON) 100 MG capsule Take by mouth 3 (three) times daily as needed for cough.  . calcium citrate-vitamin D (CITRACAL+D) 315-200 MG-UNIT per tablet Take 1 tablet by mouth 3 (three) times daily.  . carbidopa-levodopa (SINEMET IR) 25-100 MG tablet TAKE TWO TABLETS BY MOUTH THREE TIMES DAILY  . Cholecalciferol (VITAMIN D3) 2000 units TABS Take 1 capsule by mouth daily.  . famotidine-calcium carbonate-magnesium hydroxide (PEPCID COMPLETE) 10-800-165 MG CHEW chewable tablet Chew 1 tablet by mouth daily as needed (heartburn).  . Multiple Vitamin (MULTIVITAMIN) tablet Take 1 tablet by mouth daily at 12 noon.   . Multiple Vitamins-Minerals (VISION-VITE PRESERVE PO) Take 1 tablet by mouth 2 (two) times daily.  . pantoprazole (PROTONIX) 40 MG tablet Take 40 mg by mouth every morning.   . simvastatin (ZOCOR) 20 MG tablet Take 20 mg by mouth every evening.  . triamterene-hydrochlorothiazide (MAXZIDE-25) 37.5-25 MG per tablet Take 1 tablet by mouth every morning.   .Marland KitchenUNABLE TO FIND Take 900 mg by mouth daily at 12 noon. Omega Red   . vitamin B-12 (CYANOCOBALAMIN) 1000 MCG tablet Take 1,000 mcg by mouth every morning.  . warfarin (COUMADIN) 5 MG tablet Take 5 mg by mouth every evening. 7.5 mg every , Thurs, Sat; 5 mg all other days   No facility-administered encounter medications on file as of 04/07/2017.     PAST MEDICAL HISTORY:   Past Medical History:  Diagnosis Date  . Anticoagulated on Coumadin   . Arthropathy of cervical facet joint    C2-3  . At risk for falls   . BPV (benign positional vertigo)   . Bruises easily   . [facility]    breast-lumpectomy,nodes ,radiation,chemo  . Chronic neck pain   . CKD (chronic kidney disease), stage II   . Diverticulosis of colon   . Elevated troponin    a. 08/2016 in setting of PAF;  b. 08/2016 low risk MV w/ fixed septal  defect (LBBB), EF 53%.  .Marland KitchenGERD (gastroesophageal reflux disease)   . Hiatal hernia   . History of aortic valve stenosis   . History of breast cancer per pt no recurrence   dx 2004-- Left breast DCIS (ER/PR+, HER2 negative) s/p  partial masectomy w/ sln bx and  chemoradiotion (completed 2004)  . History of colon polyps   . History of herpes zoster   . History of kidney stones   . History of kidney stones   . HTN (hypertension)   . Hyperlipidemia   . Hypertrophy of inter-atrial septum    a. 09/2014 Echo: ? of  atrial mass; b. 10/2014 Cardiac MRI: no atrial septal mass - polypoid lipomatous hypertrophy of superior atrial septum noted-->Benign.  . Irritable bowel syndrome   . LBBB (left bundle branch block)   . PAF (paroxysmal atrial fibrillation) (HCC)    a. CHA2DS2VASc = 4-->coumadin.  . Parkinsonism [facility])    neurologist-  dr Chastity Noland  . PONV (postoperative nausea and vomiting)   . PVC's (premature ventricular contractions)   . Right ureteral stone   . S/P aortic valve replacement with prosthetic valve    a. 04/13/2006 s/p St Jude mechnical AVR for severe AS;  b. 09/2014 Echo: EF 40-45%, gr1 DD, mild AI/MR, triv TR/PR.  . SUI (stress urinary incontinence, female)   . Unstable balance    uses cane  . Venous varices      PAST SURGICAL HISTORY:   Past Surgical History:  Procedure Laterality Date  . ANTERIOR AND POSTERIOR REPAIR  1990   cystocele/ rectocele  . AORTIC VALVE REPLACEMENT  04/13/06   dr gerhardt   and Replacement Ascending Aorta (St. Jude mechanical prosthesis)  . APPENDECTOMY  child 1950  . BREAST SURGERY Left 2004  . CARDIAC CATHETERIZATION  02-22-2001   dr Martinique   minimal non-obstructive cad/  mild aortic stenosis and aortic root enlargement/  normal LVF, ef 65%  . CARDIAC CATHETERIZATION  03-22-2006    dr Martinique   no significant obstructive cad/  severe aortic stenosis and mild to moderate aortic root enlargement/  upper normal right heart pressures  . CARDIOVASCULAR  STRESS TEST  09-30-2011   dr Martinique   lexiscan nuclear study w/ apical thinning  vs  small prior apical infarct w/ no significant ischemia/  hypokinesis of the distal septum and apex, ef 50%  . CARPAL TUNNEL RELEASE Bilateral left 09-09-2004/  right 2007  . CATARACT EXTRACTION W/ INTRAOCULAR LENS  IMPLANT, BILATERAL  2015  . COLONOSCOPY N/A 10/03/2014   Procedure: COLONOSCOPY;  Surgeon: [name], MD;  Location: [place];  Service: Endoscopy;  Laterality: N/A;  . CYSTOSCOPY WITH RETROGRADE PYELOGRAM, URETEROSCOPY AND STENT PLACEMENT Right 09/17/2016   Procedure: CYSTOSCOPY WITH RIGHT RETROGRADE PYELOGRAM, RIGHT URETEROSCOPY AND STENT PLACEMENT LASER LITHOTRIPSY, RIGHT STENT PLACEMENT;  Surgeon: [name], MD;  Location: [facility];  Service: Urology;  Laterality: Right;  . ESOPHAGEAL MANOMETRY N/A 08/12/2014   Procedure: ESOPHAGEAL MANOMETRY (EM);  Surgeon: [name], MD;  Location: [facility];  Service: Endoscopy;  Laterality: N/A;  . ESOPHAGOGASTRODUODENOSCOPY N/A 10/03/2014   Procedure: ESOPHAGOGASTRODUODENOSCOPY (EGD);  Surgeon: [name], MD;  Location[facility];  Service: Endoscopy;  Laterality: N/A;  . [place]  . FOOT SURGERY Right 2013   hammertoe x2  . HOLMIUM LASER APPLICATION Right 3/41/9379   Procedure: HOLMIUM LASER APPLICATION;  Surgeon: [name], MD;  Location: [facility];  Service: Urology;  Laterality: Right;  . PERCUTANEOUS NEPHROSTOLITHOTOMY  1993  . STONE EXTRACTION WITH BASKET Right 09/17/2016   Procedure: STONE EXTRACTION WITH BASKET;  Surgeon: [name], MD;  Location: [facility];  Service: Urology;  Laterality: Right;  . TOTAL ABDOMINAL HYSTERECTOMY  1971   w/ Bilateral Salpingoophorectomy  . TRANSTHORACIC ECHOCARDIOGRAM  10/24/2014   dr Martinique   hypokinesis of the basal and mid inferoseptal and inferior walls,  ef 40-45%,  grade 1  diastolic dysfunction/  mechanical bileaflet aortic valve noted w/ mild regur., peak grandient 51mHg, valve area 1.35cm^2/  severe MV calcification without  stenosis w/ mild regurg., peak gradient 74mHg/  mild LAE/  poss. atrial mass (MRI showed benign lipomatous hypertrophy of atrial septum) /Frazier ButtPR/mild TR    SOCIAL HISTORY:   Social History   Social History  . Marital status: Married    Spouse name: N/A  . Number of children: 3  . Years of education: 10   Occupational History  . bakery/caterer Other    retired   Social History Main Topics  . Smoking status: Never Smoker  . Smokeless tobacco: Never Used  . Alcohol use No  . Drug use: No  . Sexual activity: Not on file   Other Topics Concern  . Not on file   Social History Narrative      1/2 -1 soda a day      FAMILY HISTORY:   Family Status  Relation Status  . Mother Deceased at age 77 . Father Deceased at age 77 . Brother Deceased   colon cancer  . Brother Deceased   accident  . Brother Deceased   accident  . Brother Alive   colon cancer  . Brother Other  . Sister Alive  . Sister Alive  . Sister Alive  . Maternal Grandmother Deceased  . Maternal Grandfather Deceased  . Paternal Grandmother Deceased  . Paternal Grandfather Deceased  . Brother     ROS:  A complete 10 system review of systems was obtained and was unremarkable apart from what is mentioned above.  PHYSICAL EXAMINATION:    VITALS:   Vitals:   04/07/17 0800  BP: 118/78  Pulse: 70  SpO2: 98%  Weight: 137 lb (62.1 kg)  Height: 4' 11.75" (1.518 m)    GEN:  The patient appears stated age and is in NAD. HEENT:  Normocephalic, atraumatic.  The mucous membranes are moist. The superficial temporal arteries are without ropiness or tenderness. CV:  Mildly bradycardic with regular rhythm with 3/6 SEM Lungs:  CTAB Neck/HEME:  There are no carotid bruits bilaterally.  Head is less turned today than in the past.  No geste antagoniste. Head  is not flexed today  Neurological examination:  Orientation: The patient is alert and oriented x3.  Cranial nerves: There is good facial symmetry.  There is facial hypomimia.   There is occasional L hemifacial spasm. Extraocular muscles are intact. No square wave jerks.  The visual fields are full to confrontational testing. The speech is fluent and clear. She is hypophonic.  Minimal trouble with gutteral sounds.  Soft palate rises symmetrically and there is no tongue deviation. Hearing is intact to conversational tone. Sensation: Sensation is intact to light touch throughout. Motor: Strength is 5/5 in the bilateral upper and lower extremities.   Shoulder shrug is equal and symmetric.  There is no pronator drift.   Movement examination: Tone: There is normal tone in the bilateral upper extremities.  The tone in the lower extremities is normal.  Abnormal movements: no tremor today Coordination:  There is decremation with RAM's, seen with heel taps/toe taps R more than L Gait and Station: The patient pushes off of the chair. The patient's stride length is improved with improved stride length.  She turns en bloc.  She drags the right leg a little and is unstable.  The patient has a negative pull test.      ASSESSMENT/PLAN:  1.  Probable PSP  -I do not think that the patient has idiopathic Parkinsons Disease but rather one of the atypical parkinsonian states, and possibly  Progressive Supranuclear Palsy (PSP).  We talked about nature, etiology and pathophysiology. We talked about how the symptoms, course and prognosis differ from Parkinson's Disease.  We talked about the risks, particularly for falls and aspiration.   -Looks better on levodopa and I am going to continue carbidopa/levodopa 25/100, 2 tablets 3 times per day.    -seeing urology today and ask about potentially changing oxybutinin given number of falls  -needs to use walker at all times!!! Talked about not backing up.  Talked about the  fact that someday she will someday need to stay seated and will be wheelchair bound.    -MBE done on 05/20/16 was normal.   2.  Neck pain  -I think this is multifactorial.  She did have an MRI of the cervical spine on 06/03/2016 that demonstrated severe C2-C3 left facet arthropathy with joint effusion and bone marrow edema.  She has received injections for this.  She also has evidence of cervical dystonia, although this surprisingly has improved some with levodopa.   3.  I plan on seeing the patient back in the next 3-4 months, sooner should new neurologic issues arise.  Much greater than 50% of this 30 minute visit spent in counseling with the patient and her husband.  They asked multiple questions and I answered them to the best of my ability.

## 2017-04-07 ENCOUNTER — Encounter: Payer: Self-pay | Admitting: Neurology

## 2017-04-07 ENCOUNTER — Ambulatory Visit (INDEPENDENT_AMBULATORY_CARE_PROVIDER_SITE_OTHER): Payer: PPO | Admitting: Neurology

## 2017-04-07 VITALS — BP 118/78 | HR 70 | Ht 59.75 in | Wt 137.0 lb

## 2017-04-07 DIAGNOSIS — G231 Progressive supranuclear ophthalmoplegia [Steele-Richardson-Olszewski]: Secondary | ICD-10-CM

## 2017-04-07 DIAGNOSIS — G243 Spasmodic torticollis: Secondary | ICD-10-CM

## 2017-04-07 NOTE — Progress Notes (Signed)
Clinical Social Work Note  CSW received request from Dr. Carles Collet to provide pt and pt husband resource about [employer] event. CSW met with pt and pt husband in exam room. CSW introduced self and explained role. CSW discussed [employer] event scheduled for this Saturday, April 14th. CSW provided handout on event and brochure for [employer]. Pt and pt husband appreciative of information.  Alison Murray, MSW, LCSW Clinical Social Worker Movement [facility] [phone]

## 2017-04-14 DIAGNOSIS — I358 Other nonrheumatic aortic valve disorders: Secondary | ICD-10-CM | POA: Diagnosis not present

## 2017-04-14 DIAGNOSIS — Z7901 Long term (current) use of anticoagulants: Secondary | ICD-10-CM | POA: Diagnosis not present

## 2017-04-22 ENCOUNTER — Other Ambulatory Visit: Payer: Self-pay | Admitting: Internal Medicine

## 2017-04-22 DIAGNOSIS — Z1231 Encounter for screening mammogram for malignant neoplasm of breast: Secondary | ICD-10-CM

## 2017-05-19 DIAGNOSIS — R8299 Other abnormal findings in urine: Secondary | ICD-10-CM | POA: Diagnosis not present

## 2017-05-19 DIAGNOSIS — N390 Urinary tract infection, site not specified: Secondary | ICD-10-CM | POA: Diagnosis not present

## 2017-05-19 DIAGNOSIS — N182 Chronic kidney disease, stage 2 (mild): Secondary | ICD-10-CM | POA: Diagnosis not present

## 2017-05-19 DIAGNOSIS — M810 Age-related osteoporosis without current pathological fracture: Secondary | ICD-10-CM | POA: Diagnosis not present

## 2017-05-19 DIAGNOSIS — I10 Essential (primary) hypertension: Secondary | ICD-10-CM | POA: Diagnosis not present

## 2017-05-19 DIAGNOSIS — Z7901 Long term (current) use of anticoagulants: Secondary | ICD-10-CM | POA: Diagnosis not present

## 2017-05-26 DIAGNOSIS — E876 Hypokalemia: Secondary | ICD-10-CM | POA: Diagnosis not present

## 2017-05-26 DIAGNOSIS — Z1389 Encounter for screening for other disorder: Secondary | ICD-10-CM | POA: Diagnosis not present

## 2017-05-26 DIAGNOSIS — Z0000 Encounter for general adult medical examination without abnormal findings: Secondary | ICD-10-CM | POA: Diagnosis not present

## 2017-05-26 DIAGNOSIS — F5104 Psychophysiologic insomnia: Secondary | ICD-10-CM | POA: Diagnosis not present

## 2017-05-26 DIAGNOSIS — I10 Essential (primary) hypertension: Secondary | ICD-10-CM | POA: Diagnosis not present

## 2017-05-26 DIAGNOSIS — Z6827 Body mass index (BMI) 27.0-27.9, adult: Secondary | ICD-10-CM | POA: Diagnosis not present

## 2017-05-26 DIAGNOSIS — Z952 Presence of prosthetic heart valve: Secondary | ICD-10-CM | POA: Diagnosis not present

## 2017-05-26 DIAGNOSIS — G231 Progressive supranuclear ophthalmoplegia [Steele-Richardson-Olszewski]: Secondary | ICD-10-CM | POA: Diagnosis not present

## 2017-05-26 DIAGNOSIS — Z7901 Long term (current) use of anticoagulants: Secondary | ICD-10-CM | POA: Diagnosis not present

## 2017-05-26 DIAGNOSIS — R2689 Other abnormalities of gait and mobility: Secondary | ICD-10-CM | POA: Diagnosis not present

## 2017-05-26 DIAGNOSIS — K219 Gastro-esophageal reflux disease without esophagitis: Secondary | ICD-10-CM | POA: Diagnosis not present

## 2017-05-26 DIAGNOSIS — R05 Cough: Secondary | ICD-10-CM | POA: Diagnosis not present

## 2017-06-02 ENCOUNTER — Ambulatory Visit
Admission: RE | Admit: 2017-06-02 | Discharge: 2017-06-02 | Disposition: A | Discharge status: Home / Self Care | Payer: PPO | Source: Ambulatory Visit | Attending: Internal Medicine | Admitting: Internal Medicine

## 2017-06-02 DIAGNOSIS — Z1231 Encounter for screening mammogram for malignant neoplasm of breast: Secondary | ICD-10-CM | POA: Diagnosis not present

## 2017-06-02 HISTORY — DX: Personal history of irradiation: Z92.3

## 2017-06-02 HISTORY — DX: Personal history of antineoplastic chemotherapy: Z92.21

## 2017-06-03 ENCOUNTER — Emergency Department (HOSPITAL_COMMUNITY): Payer: PPO

## 2017-06-03 ENCOUNTER — Encounter (HOSPITAL_COMMUNITY): Payer: Self-pay | Admitting: Emergency Medicine

## 2017-06-03 ENCOUNTER — Emergency Department (HOSPITAL_COMMUNITY)
Admission: EM | Admit: 2017-06-03 | Discharge: 2017-06-03 | Disposition: A | Discharge status: Home / Self Care | Payer: PPO | Attending: Emergency Medicine | Admitting: Emergency Medicine

## 2017-06-03 DIAGNOSIS — W0110XD Fall on same level from slipping, tripping and stumbling with subsequent striking against unspecified object, subsequent encounter: Secondary | ICD-10-CM | POA: Insufficient documentation

## 2017-06-03 DIAGNOSIS — G20 Parkinson's disease: Secondary | ICD-10-CM | POA: Insufficient documentation

## 2017-06-03 DIAGNOSIS — S0990XD Unspecified injury of head, subsequent encounter: Secondary | ICD-10-CM | POA: Diagnosis not present

## 2017-06-03 DIAGNOSIS — R51 Headache: Secondary | ICD-10-CM | POA: Diagnosis not present

## 2017-06-03 DIAGNOSIS — Z79899 Other long term (current) drug therapy: Secondary | ICD-10-CM | POA: Insufficient documentation

## 2017-06-03 DIAGNOSIS — Z853 Personal history of malignant neoplasm of breast: Secondary | ICD-10-CM | POA: Diagnosis not present

## 2017-06-03 DIAGNOSIS — I129 Hypertensive chronic kidney disease with stage 1 through stage 4 chronic kidney disease, or unspecified chronic kidney disease: Secondary | ICD-10-CM | POA: Insufficient documentation

## 2017-06-03 DIAGNOSIS — N182 Chronic kidney disease, stage 2 (mild): Secondary | ICD-10-CM | POA: Diagnosis not present

## 2017-06-03 DIAGNOSIS — S0093XD Contusion of unspecified part of head, subsequent encounter: Secondary | ICD-10-CM | POA: Insufficient documentation

## 2017-06-03 DIAGNOSIS — W19XXXD Unspecified fall, subsequent encounter: Secondary | ICD-10-CM

## 2017-06-03 DIAGNOSIS — S199XXA Unspecified injury of neck, initial encounter: Secondary | ICD-10-CM | POA: Diagnosis not present

## 2017-06-03 DIAGNOSIS — Z7901 Long term (current) use of anticoagulants: Secondary | ICD-10-CM | POA: Insufficient documentation

## 2017-06-03 DIAGNOSIS — S0990XA Unspecified injury of head, initial encounter: Secondary | ICD-10-CM | POA: Diagnosis not present

## 2017-06-03 MED ORDER — METOCLOPRAMIDE HCL 10 MG PO TABS: 10.0000 mg | ORAL_TABLET | Freq: Four times a day (QID) | ORAL | 0 refills | Status: DC

## 2017-06-03 MED ORDER — PREDNISONE 20 MG PO TABS
60.0000 mg | ORAL_TABLET | Freq: Once | ORAL | Status: AC
  Administered 2017-06-03: 60 mg via ORAL
  Filled 2017-06-03: qty 3

## 2017-06-03 MED ORDER — ACETAMINOPHEN 325 MG PO TABS
650.0000 mg | ORAL_TABLET | Freq: Once | ORAL | Status: AC
  Administered 2017-06-03: 650 mg via ORAL
  Filled 2017-06-03: qty 2

## 2017-06-03 MED ORDER — METOCLOPRAMIDE HCL 10 MG PO TABS
10.0000 mg | ORAL_TABLET | Freq: Once | ORAL | Status: AC
  Administered 2017-06-03: 10 mg via ORAL
  Filled 2017-06-03: qty 1

## 2017-06-03 MED ORDER — PREDNISONE 10 MG PO TABS: 40.0000 mg | ORAL_TABLET | Freq: Every day | ORAL | 0 refills | Status: AC

## 2017-06-03 NOTE — ED Provider Notes (Signed)
[facility] DEPT Provider Note   CSN: [id number] Arrival date & time: 06/03/17  1918     History   Chief Complaint Chief Complaint  Patient presents with  . Fall    HPI [name] is a 76 y.o. female.  HPI    [name] is a 76 y.o. female, with a history of Progressive supranuclear palsy, CKD, aortic valve replacement, and A. fib, presenting to the ED with a mechanical fall a week and a half ago. Came into the ED due to persistent pain in the back of the head as well as a frontal bilateral headache. Pain is 8/10, sharp in the occipital region, throbbing in the frontal region, nonradiating. Tylenol has not improved pain. Patient saw her PCP shortly after the fall. States she wants to check "to make sure nothing is wrong inside the head." Denies LOC, dizziness/lightheadedness, vision changes, N/V, acute neck/back pain, acute neuro deficits, or any other complaints.  Has had her INR checked in the past week and was therapeutic at 2.5.   Past Medical History:  Diagnosis Date  . Anticoagulated on Coumadin   . Arthropathy of cervical facet joint (HCC)    C2-3  . At risk for falls   . BPV (benign positional vertigo)   . Bruises easily   . Cancer ([place]) 2004    breast-lumpectomy,nodes ,radiation,chemo  . Chronic neck pain   . CKD (chronic kidney disease), stage II   . Diverticulosis of colon   . Elevated troponin    a. 08/2016 in setting of PAF;  b. 08/2016 low risk MV w/ fixed septal defect (LBBB), EF 53%.  Marland Kitchen GERD (gastroesophageal reflux disease)   . Hiatal hernia   . History of aortic valve stenosis   . History of breast cancer per pt no recurrence   dx 2004-- Left breast DCIS (ER/PR+, HER2 negative) s/p  partial masectomy w/ sln bx and  chemoradiotion (completed 2004)  . History of colon polyps   . History of herpes zoster   . History of kidney stones   . History of kidney stones   . HTN (hypertension)   . Hyperlipidemia   . Hypertrophy of inter-atrial  septum    a. 09/2014 Echo: ? of atrial mass; b. 10/2014 Cardiac MRI: no atrial septal mass - polypoid lipomatous hypertrophy of superior atrial septum noted-->Benign.  . Irritable bowel syndrome   . LBBB (left bundle branch block)   . PAF (paroxysmal atrial fibrillation) (HCC)    a. CHA2DS2VASc = 4-->coumadin.  . Parkinsonism [facility])    neurologist-  dr tat  . Personal history of chemotherapy 2004  . Personal history of radiation therapy 2004  . PONV (postoperative nausea and vomiting)   . PVC's (premature ventricular contractions)   . Right ureteral stone   . S/P aortic valve replacement with prosthetic valve    a. 04/13/2006 s/p St Jude mechnical AVR for severe AS;  b. 09/2014 Echo: EF 40-45%, gr1 DD, mild AI/MR, triv TR/PR.  . SUI (stress urinary incontinence, female)   . Unstable balance    uses cane  . Venous varices      Patient Active Problem List   Diagnosis Date Noted  . Elevated troponin 09/19/2016  . Emesis 09/19/2016  . UTI (urinary tract infection) 09/19/2016  . Sinus bradycardia 09/16/2016  . Family history of colon cancer 07/30/2014  . Hiatal hernia 07/30/2014  . Chest pain 10/13/2011  . Disequilibrium 10/13/2011  . Heart valve replaced by other means 04/16/2011  .  Chronic anticoagulation 03/17/2011  . S/P AVR (aortic valve replacement)   . PVC's (premature ventricular contractions)   . HTN (hypertension)   . Hypercholesteremia   . Atrial fibrillation with RVR ([place])   . GERD (gastroesophageal reflux disease)   . GERD 02/06/2010  . DIVERTICULOSIS-COLON 02/06/2010  . PERSONAL HX COLONIC POLYPS 02/06/2010  . MASTECTOMY, HX OF 02/06/2010    Past Surgical History:  Procedure Laterality Date  . ANTERIOR AND POSTERIOR REPAIR  1990   cystocele/ rectocele  . AORTIC VALVE REPLACEMENT  04/13/06   dr gerhardt   and Replacement Ascending Aorta (St. Jude mechanical prosthesis)  . APPENDECTOMY  child 1950  . BREAST LUMPECTOMY Left 01/11/2003   malignant  . BREAST  SURGERY Left 2004  . CARDIAC CATHETERIZATION  02-22-2001   dr Martinique   minimal non-obstructive cad/  mild aortic stenosis and aortic root enlargement/  normal LVF, ef 65%  . CARDIAC CATHETERIZATION  03-22-2006    dr Martinique   no significant obstructive cad/  severe aortic stenosis and mild to moderate aortic root enlargement/  upper normal right heart pressures  . CARDIOVASCULAR STRESS TEST  09-30-2011   dr Martinique   lexiscan nuclear study w/ apical thinning  vs  small prior apical infarct w/ no significant ischemia/  hypokinesis of the distal septum and apex, ef 50%  . CARPAL TUNNEL RELEASE Bilateral left 09-09-2004/  right 2007  . CATARACT EXTRACTION W/ INTRAOCULAR LENS  IMPLANT, BILATERAL  2015  . COLONOSCOPY N/A 10/03/2014   Procedure: COLONOSCOPY;  Surgeon: [name], MD;  Location: [facility];  Service: Endoscopy;  Laterality: N/A;  . CYSTOSCOPY WITH RETROGRADE PYELOGRAM, URETEROSCOPY AND STENT PLACEMENT Right 09/17/2016   Procedure: CYSTOSCOPY WITH RIGHT RETROGRADE PYELOGRAM, RIGHT URETEROSCOPY AND STENT PLACEMENT LASER LITHOTRIPSY, RIGHT STENT PLACEMENT;  Surgeon: [name], MD;  Location: [facility];  Service: Urology;  Laterality: Right;  . ESOPHAGEAL MANOMETRY N/A 08/12/2014   Procedure: ESOPHAGEAL MANOMETRY (EM);  Surgeon: [name], MD;  Location: [facility] ENDOSCOPY;  Service: Endoscopy;  Laterality: N/A;  . ESOPHAGOGASTRODUODENOSCOPY N/A 10/03/2014   Procedure: ESOPHAGOGASTRODUODENOSCOPY (EGD);  Surgeon: [name], MD;  Location: [facility] ENDOSCOPY;  Service: Endoscopy;  Laterality: N/A;  . [place]  . FOOT SURGERY Right 2013   hammertoe x2  . HOLMIUM LASER APPLICATION Right 6/83/7290   Procedure: HOLMIUM LASER APPLICATION;  Surgeon: [name], MD;  Location: [facility];  Service: Urology;  Laterality: Right;  . PERCUTANEOUS NEPHROSTOLITHOTOMY  1993  . STONE EXTRACTION WITH BASKET Right 09/17/2016     Procedure: STONE EXTRACTION WITH BASKET;  Surgeon: [name], MD;  Location: [facility];  Service: Urology;  Laterality: Right;  . TOTAL ABDOMINAL HYSTERECTOMY  1971   w/ Bilateral Salpingoophorectomy  . TRANSTHORACIC ECHOCARDIOGRAM  10/24/2014   dr Martinique   hypokinesis of the basal and mid inferoseptal and inferior walls,  ef 40-45%,  grade 1 diastolic dysfunction/  mechanical bileaflet aortic valve noted w/ mild regur., peak grandient 17mHg, valve area 1.35cm^2/  severe MV calcification without stenosis w/ mild regurg., peak gradient 312mg/  mild LAE/  poss. atrial mass (MRI showed benign lipomatous hypertrophy of atrial septum) /tFrazier ButtR/mild TR    OB History    No data available       Home Medications    Prior to Admission medications   Medication Sig Start Date End Date Taking? Authorizing Provider  acetaminophen (TYLENOL) 500 MG tablet  Take 1,000 mg by mouth every 6 (six) hours as needed (head pain).    Yes [provider]  calcium carbonate (TUMS - DOSED IN MG ELEMENTAL CALCIUM) 500 MG chewable tablet Chew 1 tablet by mouth daily.   Yes [provider]  calcium citrate-vitamin D (CITRACAL+D) 315-200 MG-UNIT per tablet Take 1 tablet by mouth 3 (three) times daily.   Yes [provider]  carbidopa-levodopa (SINEMET IR) 25-100 MG tablet TAKE TWO TABLETS BY MOUTH THREE TIMES DAILY 12/07/16  Yes Tat, Eustace Quail, DO  Cholecalciferol (VITAMIN D3) 2000 units TABS Take 1 capsule by mouth daily.   Yes [provider]  famotidine-calcium carbonate-magnesium hydroxide (PEPCID COMPLETE) 10-800-165 MG CHEW chewable tablet Chew 1 tablet by mouth daily as needed (heartburn).   Yes [provider]  KLOR-CON M20 20 MEQ tablet Take 20 mEq by mouth daily. 05/26/17  Yes [provider]  Multiple Vitamin (MULTIVITAMIN) tablet Take 1 tablet by mouth daily at 12 noon.    Yes [provider]  Multiple Vitamins-Minerals  (VISION-VITE PRESERVE PO) Take 1 tablet by mouth 2 (two) times daily.   Yes [provider]  simvastatin (ZOCOR) 20 MG tablet Take 20 mg by mouth every evening.   Yes [provider]  triamterene-hydrochlorothiazide (MAXZIDE-25) 37.5-25 MG per tablet Take 1 tablet by mouth every morning.    Yes [provider]  UNABLE TO FIND Take 900 mg by mouth daily at 12 noon. Omega Red    Yes [provider]  vitamin B-12 (CYANOCOBALAMIN) 1000 MCG tablet Take 1,000 mcg by mouth every morning.   Yes [provider]  warfarin (COUMADIN) 5 MG tablet Take 5 mg by mouth every evening. Take 7.5 mg by mouth every Tues and Sat; take 5 mg by mouth all other days   Yes [provider]  metoCLOPramide (REGLAN) 10 MG tablet Take 1 tablet (10 mg total) by mouth every 6 (six) hours. 06/03/17   Joy, Shawn C, PA-C  predniSONE (DELTASONE) 10 MG tablet Take 4 tablets (40 mg total) by mouth daily with breakfast. 06/03/17 06/07/17  Joy, Helane Gunther, PA-C    Family History Family History  Problem Relation Age of Onset  . Heart disease Mother   . Heart disease Father 10       CABG  . Other Brother        AVR    Social History Social History  Substance Use Topics  . Smoking status: Never Smoker  . Smokeless tobacco: Never Used  . Alcohol use No     Allergies   Demerol [meperidine]; Oxycodone; Adhesive [tape]; Ivp dye [iodinated diagnostic agents]; and Phenergan [promethazine]   Review of Systems Review of Systems  Constitutional: Negative for diaphoresis.  HENT:       Head pain  Respiratory: Negative for shortness of breath.   Cardiovascular: Negative for chest pain.  Gastrointestinal: Negative for nausea and vomiting.  Musculoskeletal: Negative for back pain and neck pain.  Neurological: Positive for headaches. Negative for dizziness, syncope, weakness, light-headedness and numbness.  All other systems reviewed and are negative.    Physical Exam Updated  Vital Signs BP (!) 163/85 (BP Location: Right Arm)   Pulse 73   Temp 98.3 F (36.8 C) (Oral)   Resp 18   Ht 4' 11" (1.499 m)   Wt 63 kg (139 lb)   SpO2 98%   BMI 28.07 kg/m   Physical Exam  Constitutional: She appears well-developed and well-nourished. No distress.  HENT:  Head: Normocephalic.  Small, tender, firm mass to the left occipital region with surrounding swelling and ecchymosis. Patient's scalp and face are otherwise without signs of trauma.  Eyes: Conjunctivae and EOM are normal. Pupils are equal, round, and reactive to light.  Neck: Normal range of motion. Neck supple.  Cardiovascular: Normal rate, regular rhythm, normal heart sounds and intact distal pulses.   Pulmonary/Chest: Effort normal and breath sounds normal. No respiratory distress.  Abdominal: Soft. There is no tenderness. There is no guarding.  Musculoskeletal: She exhibits no edema.  Neurological: She is alert.  No sensory deficits. Strength 5/5 in all extremities. No upright ataxia. Coordination intact. Cranial nerves III-XII grossly intact. No facial droop.   Skin: Skin is warm and dry. She is not diaphoretic.  Psychiatric: She has a normal mood and affect. Her behavior is normal.  Nursing note and vitals reviewed.    ED Treatments / Results  Labs (all labs ordered are listed, but only abnormal results are displayed) Labs Reviewed - No data to display  EKG  EKG Interpretation None       Radiology Ct Head Wo Contrast  Result Date: 06/03/2017 CLINICAL DATA:  Fall and injured the back of the head, mild headache EXAM: CT HEAD WITHOUT CONTRAST CT CERVICAL SPINE WITHOUT CONTRAST TECHNIQUE: Multidetector CT imaging of the head and cervical spine was performed following the standard protocol without intravenous contrast. Multiplanar CT image reconstructions of the cervical spine were also generated. COMPARISON:  03/19/2017, 08/28/2016 FINDINGS: CT HEAD FINDINGS Brain: No acute territorial infarction,  hemorrhage or intracranial mass is seen. Mild to moderate atrophy. Stable ventricle size. Minimal white matter small vessel ischemic change. Vascular: No hyperdense vessels.  Carotid artery calcification. Skull: No fracture or suspicious bone lesion Sinuses/Orbits: No acute finding. Other: Small calcified left frontal scalp mass. Small left posterior parietal scalp hematoma. CT CERVICAL SPINE FINDINGS Alignment: Mild cervical kyphosis. Facet alignment within normal limits. Skull base and vertebrae: Craniovertebral junction is intact. There is no fracture visualized. Incomplete fusion of the posterior arch of C1. Soft tissues and spinal canal: No prevertebral fluid or swelling. No visible canal hematoma. Disc levels:  Mild degenerative disc changes at C5-C6 and C6-C7. Upper chest: Negative. Other: None IMPRESSION: 1. No CT evidence for acute intracranial abnormality. Small left posterior parietal scalp hematoma 2. Reversal of cervical lordosis.  No fracture is seen. Electronically Signed   By: Donavan Foil M.D.   On: 06/03/2017 21:29   Ct Cervical Spine Wo Contrast  Result Date: 06/03/2017 CLINICAL DATA:  Fall and injured the back of the head, mild headache EXAM: CT HEAD WITHOUT CONTRAST CT CERVICAL SPINE WITHOUT CONTRAST TECHNIQUE: Multidetector CT imaging of the head and cervical spine was performed following the standard protocol without intravenous contrast. Multiplanar CT image reconstructions of the cervical spine were also generated. COMPARISON:  03/19/2017, 08/28/2016 FINDINGS: CT HEAD FINDINGS Brain: No acute territorial infarction, hemorrhage or intracranial mass is seen. Mild to moderate atrophy. Stable ventricle size. Minimal white matter small vessel ischemic change. Vascular: No hyperdense vessels.  Carotid artery calcification. Skull: No fracture or suspicious bone lesion Sinuses/Orbits: No acute finding. Other: Small calcified left frontal scalp mass. Small left posterior parietal scalp hematoma.  CT CERVICAL SPINE FINDINGS Alignment: Mild cervical kyphosis. Facet alignment within normal limits. Skull base and vertebrae: Craniovertebral junction is intact. There is no fracture visualized. Incomplete fusion of the posterior arch of C1. Soft tissues and spinal canal: No prevertebral fluid or swelling. No visible canal hematoma.  Disc levels:  Mild degenerative disc changes at C5-C6 and C6-C7. Upper chest: Negative. Other: None IMPRESSION: 1. No CT evidence for acute intracranial abnormality. Small left posterior parietal scalp hematoma 2. Reversal of cervical lordosis.  No fracture is seen. Electronically Signed   By: Donavan Foil M.D.   On: 06/03/2017 21:29   Mm Screening Breast Tomo Bilateral  Result Date: 06/03/2017 CLINICAL DATA:  Screening. EXAM: 2D DIGITAL SCREENING BILATERAL MAMMOGRAM WITH CAD AND ADJUNCT TOMO COMPARISON:  Previous exam(s). ACR Breast Density Category a: The breast tissue is almost entirely fatty. FINDINGS: There are no findings suspicious for malignancy. Lumpectomy changes of the left breast. Images were processed with CAD. IMPRESSION: No mammographic evidence of malignancy. A result letter of this screening mammogram will be mailed directly to the patient. RECOMMENDATION: Screening mammogram in one year. (Code:SM-B-01Y) BI-RADS CATEGORY  1: Negative. Electronically Signed   By: Curlene Dolphin M.D.   On: 06/03/2017 07:54    Procedures Procedures (including critical care time)  Medications Ordered in ED Medications  acetaminophen (TYLENOL) tablet 650 mg (650 mg Oral Given 06/03/17 2253)  predniSONE (DELTASONE) tablet 60 mg (60 mg Oral Given 06/03/17 2254)  metoCLOPramide (REGLAN) tablet 10 mg (10 mg Oral Given 06/03/17 2255)     Initial Impression / Assessment and Plan / ED Course  I have reviewed the triage vital signs and the nursing notes.  Pertinent labs & imaging results that were available during my care of the patient were reviewed by me and considered in my medical  decision making (see chart for details).     Patient presents with headache and scalp pain following a fall week and a half ago. No acute neuro or functional deficits. No acute abnormalities on CT. Patient was offered IV medications and lab tests with explanation. Patient states she would rather have oral medications, if possible. PCP follow-up. The patient was given instructions for home care as well as return precautions. Patient voices understanding of these instructions, accepts the plan, and is comfortable with discharge.  Findings and plan of care discussed with Gareth Morgan, MD. Dr. Billy Fischer personally evaluated and examined this patient.  Vitals:   06/03/17 1923 06/03/17 1924 06/03/17 2156  BP: (!) 163/85  129/61  Pulse: 73  66  Resp: 18  17  Temp: 98.3 F (36.8 C)    TempSrc: Oral    SpO2: 98%  94%  Weight:  63 kg (139 lb)   Height:  4' 11" (1.499 m)      Final Clinical Impressions(s) / ED Diagnoses   Final diagnoses:  Injury of head, subsequent encounter  Fall, subsequent encounter    New Prescriptions New Prescriptions   METOCLOPRAMIDE (REGLAN) 10 MG TABLET    Take 1 tablet (10 mg total) by mouth every 6 (six) hours.   PREDNISONE (DELTASONE) 10 MG TABLET    Take 4 tablets (40 mg total) by mouth daily with breakfast.     Lorayne Bender, PA-C 06/03/17 2314    Gareth Morgan, MD 06/04/17 1402

## 2017-06-03 NOTE — ED Triage Notes (Signed)
Patient reports hitting back of head on table about a week ago after falling due to parkinsons. Pt has lump noted to back of head. Pt is ambulatory with cane. Reports mild headache and slight nausea. Pt is on coumadin.

## 2017-06-03 NOTE — ED Notes (Signed)
Bed: XB93 Expected date:  Expected time:  Means of arrival:  Comments: Tri

## 2017-06-03 NOTE — Discharge Instructions (Signed)
You have been seen for evaluation following a head injury. May continue to use tylenol for pain. May add in the reglan and prednisone, as prescribed. Follow-up with your primary care provider for continued management of this issue.

## 2017-06-07 ENCOUNTER — Other Ambulatory Visit: Payer: Self-pay | Admitting: Neurology

## 2017-06-16 DIAGNOSIS — G231 Progressive supranuclear ophthalmoplegia [Steele-Richardson-Olszewski]: Secondary | ICD-10-CM | POA: Diagnosis not present

## 2017-06-16 DIAGNOSIS — H04123 Dry eye syndrome of bilateral lacrimal glands: Secondary | ICD-10-CM | POA: Diagnosis not present

## 2017-07-05 DIAGNOSIS — I358 Other nonrheumatic aortic valve disorders: Secondary | ICD-10-CM | POA: Diagnosis not present

## 2017-07-05 DIAGNOSIS — Z7901 Long term (current) use of anticoagulants: Secondary | ICD-10-CM | POA: Diagnosis not present

## 2017-07-19 DIAGNOSIS — H5203 Hypermetropia, bilateral: Secondary | ICD-10-CM | POA: Diagnosis not present

## 2017-07-19 DIAGNOSIS — H04123 Dry eye syndrome of bilateral lacrimal glands: Secondary | ICD-10-CM | POA: Diagnosis not present

## 2017-08-04 DIAGNOSIS — I358 Other nonrheumatic aortic valve disorders: Secondary | ICD-10-CM | POA: Diagnosis not present

## 2017-08-04 DIAGNOSIS — Z7901 Long term (current) use of anticoagulants: Secondary | ICD-10-CM | POA: Diagnosis not present

## 2017-08-09 NOTE — Progress Notes (Signed)
[name] was seen today in the movement disorders clinic for neurologic consultation at the request of Dr Tomi Likens for a 2nd opinion regarding gait instability.  She is accompanied by her husband who supplements the hx.  The records that were made available to me were reviewed.  Pt reports that she has been having falls, the first of which was several years ago but then she began to have more regular falls 6-8 months ago.  Most falls are when she turns and she notes that the feet will shuffle and the "body will turn and the feet will stay."  Her husband states that her feet will stay like "they are stuck on bumble gum."  No falls in last 2 weeks.  She has been in rehab recently.    07/06/16 update:  The patient is following up today regarding probable PSP.  Went to the atypical symposium I had recommended and they thought that it was very informative.  She had a modified barium swallow on 05/20/2016 that recommended a regular diet with thin liquids.  She has been participating with physical and occupational therapy since last visit.  I started her on levodopa (6am/12noon/5pm) and she has not noticed a great difference with that. No SE.  Has had 2 falls.  With one she tripped over her cane and with one she fell changing her sheets.   This was very low dose, and a benefit was not expected at this low dose.  She did see Dr. Rexene Alberts in 06/22/2016 for sleep apnea.  A sleep study was recommended.  Pt states that she hasn't scheduled that yet.  States that she had an injection in neck for pain 2 weeks ago and thinks that it has helped some.  States that she is contemplating cervical spine surgery.  She did have an MRI of the cervical spine on 06/03/2016 that demonstrated severe C2-C3 left facet arthropathy with joint effusion and bone marrow edema.  11/23/16 update:  Pt f/u today.  Last visit, I increased her carbidopa/levodopa 25/100 to 2 po tid.  She states that she noted no great difference with that.   Multiple falls over the last few weeks (5) for no reason.  One time she tripped over a threshold.  One time she fell in a bathroom.  One time she was working with the christmas tree.  All the falls except one were forward.   Unfortunately, the PSP study that was supposed to be done local had a site change and the closest site was [place], [place].  They also were told that she wasn't a good candidate because of the coumadin.  Had 2 kidney stones since last visit.  On oxybutinin.  Seeing urology this afternoon.  04/07/17 update:  Patient follows up today.  She is on carbidopa/levodopa 25/100, 2 tablets 3 times per day.  She has had 2   falls since our last visit.  She was in the emergency room on 03/19/2017 after she fell backward and hit her head.  She was stepping backwards to put trash in the trashcan.  She had the walker but was going backward.  I reviewed the emergency room notes.  I also reviewed her CT of the brain which was done that day.  It was unremarkable and demonstrated chronic white matter disease.  The other fall was in the bathroom and she was turning around to look at her hair.  She hit her bottom.  She has attended speech, occupational and physical therapy  since our last visit and those notes were reviewed.  She has had 1 day of lightheadedness but no near syncope.  She states that her mood has been good.  She is sleeping well.  She has had some intermittent diplopia.  08/11/17 update:  Patient seen in follow-up today for her atypical parkinsonism.  This patient is accompanied in the office by her spouse who supplements the history.  Patient is on carbidopa/levodopa 25/100, 2 tablets 3 times per day.  She has had one fall since last visit.  I have reviewed records made available to me.  She was in the emergency room on June 8 with a fall.  The fall actually happened a week and a half prior to her presentation to the emergency room.  She was trying to back up and fell over the walker.  She had a  CT of the brain and cervical spine.  With the exception of a posterior scalp hematoma, it was otherwise unremarkable.   She was given a RX for metoclopramide and husband asks me about why that is.  They didn't get it filled because they read it shouldn't be taken if patient has parkinsonism.  Husband states she had no nausea.  Using walker and no other falls besides one described.  They changed to a lower bed because was having trouble getting up on other one.  She continues to have some intermittent diplopia.  Since our last visit, the patient did see her urologist and she states that she had a kidney stone removed.  PREVIOUS MEDICATIONS: none to date  ALLERGIES:   Allergies  Allergen Reactions  . Demerol [Meperidine] Shortness Of Breath and Swelling  . Oxycodone Other (See Comments)    Hallucinations   . Adhesive [Tape] Other (See Comments)    Redness and skin tears  . Ivp Dye [Iodinated Diagnostic Agents] Hives    OK with benadryl pre-med (14m one hour before receiving iodinated contrast agent)  . Phenergan [Promethazine] Other (See Comments)    Avoids due to reaction with current medications    CURRENT MEDICATIONS:  Outpatient Encounter Prescriptions as of 08/11/2017  Medication Sig  . acetaminophen (TYLENOL) 500 MG tablet Take 1,000 mg by mouth every 6 (six) hours as needed (head pain).   . calcium carbonate (TUMS - DOSED IN MG ELEMENTAL CALCIUM) 500 MG chewable tablet Chew 1 tablet by mouth daily.  . calcium citrate-vitamin D (CITRACAL+D) 315-200 MG-UNIT per tablet Take 1 tablet by mouth 3 (three) times daily.  . carbidopa-levodopa (SINEMET IR) 25-100 MG tablet TAKE TWO TABLETS BY MOUTH THREE TIMES DAILY  . Cholecalciferol (VITAMIN D3) 2000 units TABS Take 1 capsule by mouth daily.  . famotidine-calcium carbonate-magnesium hydroxide (PEPCID COMPLETE) 10-800-165 MG CHEW chewable tablet Chew 1 tablet by mouth daily as needed (heartburn).  .Marland KitchenKLOR-CON M20 20 MEQ tablet Take 20 mEq by  mouth daily.  .Marland KitchenKRILL OIL PO Take by mouth.  . Melatonin 3 MG TABS Take by mouth.  . Multiple Vitamin (MULTIVITAMIN) tablet Take 1 tablet by mouth daily at 12 noon.   . Multiple Vitamins-Minerals (VISION-VITE PRESERVE PO) Take 1 tablet by mouth 2 (two) times daily.  . pantoprazole (PROTONIX) 40 MG tablet Take 40 mg by mouth daily.  . simvastatin (ZOCOR) 20 MG tablet Take 20 mg by mouth every evening.  . triamterene-hydrochlorothiazide (MAXZIDE-25) 37.5-25 MG per tablet Take 1 tablet by mouth every morning.   . vitamin B-12 (CYANOCOBALAMIN) 1000 MCG tablet Take 1,000 mcg by mouth every morning.  .Marland Kitchen  warfarin (COUMADIN) 5 MG tablet Take 5 mg by mouth every evening. Take 7.5 mg by mouth every Tues and Sat; take 5 mg by mouth all other days  . [DISCONTINUED] metoCLOPramide (REGLAN) 10 MG tablet Take 1 tablet (10 mg total) by mouth every 6 (six) hours.  . [DISCONTINUED] UNABLE TO FIND Take 900 mg by mouth daily at 12 noon. Omega Red    No facility-administered encounter medications on file as of 08/11/2017.     PAST MEDICAL HISTORY:   Past Medical History:  Diagnosis Date  . Anticoagulated on Coumadin   . Arthropathy of cervical facet joint (HCC)    C2-3  . At risk for falls   . BPV (benign positional vertigo)   . Bruises easily   . Cancer ([place]) 2004    breast-lumpectomy,nodes ,radiation,chemo  . Chronic neck pain   . CKD (chronic kidney disease), stage II   . Diverticulosis of colon   . Elevated troponin    a. 08/2016 in setting of PAF;  b. 08/2016 low risk MV w/ fixed septal defect (LBBB), EF 53%.  Marland Kitchen GERD (gastroesophageal reflux disease)   . Hiatal hernia   . History of aortic valve stenosis   . History of breast cancer per pt no recurrence   dx 2004-- Left breast DCIS (ER/PR+, HER2 negative) s/p  partial masectomy w/ sln bx and  chemoradiotion (completed 2004)  . History of colon polyps   . History of herpes zoster   . History of kidney stones   . History of kidney stones   . HTN  (hypertension)   . Hyperlipidemia   . Hypertrophy of inter-atrial septum    a. 09/2014 Echo: ? of atrial mass; b. 10/2014 Cardiac MRI: no atrial septal mass - polypoid lipomatous hypertrophy of superior atrial septum noted-->Benign.  . Irritable bowel syndrome   . LBBB (left bundle branch block)   . PAF (paroxysmal atrial fibrillation) (HCC)    a. CHA2DS2VASc = 4-->coumadin.  . Parkinsonism [facility])    neurologist-  dr Dj Senteno  . Personal history of chemotherapy 2004  . Personal history of radiation therapy 2004  . PONV (postoperative nausea and vomiting)   . PVC's (premature ventricular contractions)   . Right ureteral stone   . S/P aortic valve replacement with prosthetic valve    a. 04/13/2006 s/p St Jude mechnical AVR for severe AS;  b. 09/2014 Echo: EF 40-45%, gr1 DD, mild AI/MR, triv TR/PR.  . SUI (stress urinary incontinence, female)   . Unstable balance    uses cane  . Venous varices      PAST SURGICAL HISTORY:   Past Surgical History:  Procedure Laterality Date  . ANTERIOR AND POSTERIOR REPAIR  1990   cystocele/ rectocele  . AORTIC VALVE REPLACEMENT  04/13/06   dr gerhardt   and Replacement Ascending Aorta (St. Jude mechanical prosthesis)  . APPENDECTOMY  child 1950  . BREAST LUMPECTOMY Left 01/11/2003   malignant  . BREAST SURGERY Left 2004  . CARDIAC CATHETERIZATION  02-22-2001   dr [place]   minimal non-obstructive cad/  mild aortic stenosis and aortic root enlargement/  normal LVF, ef 65%  . CARDIAC CATHETERIZATION  03-22-2006    dr [place]   no significant obstructive cad/  severe aortic stenosis and mild to moderate aortic root enlargement/  upper normal right heart pressures  . CARDIOVASCULAR STRESS TEST  09-30-2011   dr [place]   lexiscan nuclear study w/ apical thinning  vs  small prior apical infarct w/  no significant ischemia/  hypokinesis of the distal septum and apex, ef 50%  . CARPAL TUNNEL RELEASE Bilateral left 09-09-2004/  right 2007  . CATARACT EXTRACTION W/  INTRAOCULAR LENS  IMPLANT, BILATERAL  2015  . COLONOSCOPY N/A 10/03/2014   Procedure: COLONOSCOPY;  Surgeon: [name], MD;  Location: [facility];  Service: Endoscopy;  Laterality: N/A;  . CYSTOSCOPY WITH RETROGRADE PYELOGRAM, URETEROSCOPY AND STENT PLACEMENT Right 09/17/2016   Procedure: CYSTOSCOPY WITH RIGHT RETROGRADE PYELOGRAM, RIGHT URETEROSCOPY AND STENT PLACEMENT LASER LITHOTRIPSY, RIGHT STENT PLACEMENT;  Surgeon: [name], MD;  Location: [facility];  Service: Urology;  Laterality: Right;  . ESOPHAGEAL MANOMETRY N/A 08/12/2014   Procedure: ESOPHAGEAL MANOMETRY (EM);  Surgeon: [name], MD;  Location: [facility];  Service: Endoscopy;  Laterality: N/A;  . ESOPHAGOGASTRODUODENOSCOPY N/A 10/03/2014   Procedure: ESOPHAGOGASTRODUODENOSCOPY (EGD);  Surgeon: [name], MD;  Location: [facility] ENDOSCOPY;  Service: Endoscopy;  Laterality: N/A;  . [place]  . FOOT SURGERY Right 2013   hammertoe x2  . HOLMIUM LASER APPLICATION Right 04/07/8785   Procedure: HOLMIUM LASER APPLICATION;  Surgeon: [name], MD;  Location: [facility];  Service: Urology;  Laterality: Right;  . PERCUTANEOUS NEPHROSTOLITHOTOMY  1993  . STONE EXTRACTION WITH BASKET Right 09/17/2016   Procedure: STONE EXTRACTION WITH BASKET;  Surgeon: [name], MD;  Location: [facility];  Service: Urology;  Laterality: Right;  . TOTAL ABDOMINAL HYSTERECTOMY  1971   w/ Bilateral Salpingoophorectomy  . TRANSTHORACIC ECHOCARDIOGRAM  10/24/2014   dr [place]   hypokinesis of the basal and mid inferoseptal and inferior walls,  ef 40-45%,  grade 1 diastolic dysfunction/  mechanical bileaflet aortic valve noted w/ mild regur., peak grandient 78mHg, valve area 1.35cm^2/  severe MV calcification without stenosis w/ mild regurg., peak gradient 382mg/  mild LAE/  poss. atrial mass (MRI showed benign lipomatous hypertrophy of atrial  septum) /tFrazier ButtR/mild TR    SOCIAL HISTORY:   Social History   Social History  . Marital status: Married    Spouse name: N/A  . Number of children: 3  . Years of education: 10   Occupational History  . bakery/caterer Other    retired   Social History Main Topics  . Smoking status: Never Smoker  . Smokeless tobacco: Never Used  . Alcohol use No  . Drug use: No  . Sexual activity: Not on file   Other Topics Concern  . Not on file   Social History Narrative      1/2 -1 soda a day      FAMILY HISTORY:   Family Status  Relation Status  . Mother Deceased at age 77. Father Deceased at age 77. Brother Deceased       colon cancer  . Brother Deceased       accident  . Brother Deceased       accident  . Brother Alive       colon cancer  . Brother Other  . Sister Alive  . Sister Alive  . Sister Alive  . MGM Deceased  . MGF Deceased  . PGM Deceased  . PGF Deceased  . Brother (Not Specified)    ROS:  A complete 10 system review of systems was obtained and was unremarkable apart from what is mentioned above.  PHYSICAL EXAMINATION:    VITALS:   Vitals:   08/11/17 0758  BP: 112/70  Pulse: 66  SpO2: 93%  Weight: 139 lb (63 kg)  Height: 4' 11.75" (1.518 m)    GEN:  The patient appears stated age and is in NAD. HEENT:  Normocephalic, atraumatic.  The mucous membranes are moist. The superficial temporal arteries are without ropiness or tenderness. CV:  regular rhythm with 3/6 SEM Lungs:  CTAB Neck/HEME:  There are no carotid bruits bilaterally.  Head is turned to the left and she has hand on chin (geste antagoniste)  Neurological examination:  Orientation: The patient is alert and oriented x3.  Cranial nerves: There is mild R facial droop at rest but able to activate the muscles well.  There is facial hypomimia.   There is occasional L hemifacial spasm. Extraocular muscles are intact. No square wave jerks.  The visual fields are full to confrontational  testing. The speech is fluent and clear. She is hypophonic.  Minimal trouble with gutteral sounds.  Soft palate rises symmetrically and there is no tongue deviation. Hearing is intact to conversational tone. Sensation: Sensation is intact to light touch throughout. Motor: Strength is 5/5 in the bilateral upper and lower extremities.   Shoulder shrug is equal and symmetric.  There is no pronator drift.   Movement examination: Tone: There is normal tone in the bilateral upper extremities.  The tone in the lower extremities is normal.  Abnormal movements: no tremor today.  There is some facial dyskinesia Coordination:  There is decremation with RAM's, seen with heel taps/toe taps R more than L Gait and Station: The patient pushes off of the chair. The patient's stride length is decreased and she drags the L leg today and is unsteady.    ASSESSMENT/PLAN:  1.  Probable PSP  -I do not think that the patient has idiopathic Parkinsons Disease but rather one of the atypical parkinsonian states, and possibly Progressive Supranuclear Palsy (PSP).  We talked about nature, etiology and pathophysiology. We talked about how the symptoms, course and prognosis differ from Parkinson's Disease.  We talked about the risks, particularly for falls and aspiration.   -Looks better on levodopa and I am going to continue carbidopa/levodopa 25/100, 2 tablets 3 times per day.    -seeing urology and doing better.  Had stent placed for stone  -needs to use walker at all times!!! Reiterated this today.  Talked about not backing up.  Talked about the fact that someday she will someday need to stay seated and will be wheelchair bound.   Discussed again in detail.     -MBE done on 05/20/16 was normal.  She denies dysphagia so we are not repeating this. 2.  Neck pain  -I think this is multifactorial.  She did have an MRI of the cervical spine on 06/03/2016 that demonstrated severe C2-C3 left facet arthropathy with joint effusion  and bone marrow edema.  She has received injections for this.  She also has evidence of cervical dystonia, although this surprisingly has improved some with levodopa.  Reports that neck pain is doing better.    3.  I plan on seeing the patient back in the next 3-4 months, sooner should new neurologic issues arise.  Much greater than 50% of this 30 minute visit spent in counseling with the patient and her husband.  They asked multiple questions and I answered them to the best of my ability.  Pt asked about prognosis and we addressed this in detail today.  Much greater than 50% of this visit was spent in counseling and coordinating  care.  Total face to face time:  30 min

## 2017-08-11 ENCOUNTER — Encounter: Payer: Self-pay | Admitting: Neurology

## 2017-08-11 ENCOUNTER — Ambulatory Visit (INDEPENDENT_AMBULATORY_CARE_PROVIDER_SITE_OTHER): Payer: PPO | Admitting: Neurology

## 2017-08-11 VITALS — BP 112/70 | HR 66 | Ht 59.75 in | Wt 139.0 lb

## 2017-08-11 DIAGNOSIS — G243 Spasmodic torticollis: Secondary | ICD-10-CM

## 2017-08-11 DIAGNOSIS — G231 Progressive supranuclear ophthalmoplegia [Steele-Richardson-Olszewski]: Secondary | ICD-10-CM

## 2017-08-18 DIAGNOSIS — Z7901 Long term (current) use of anticoagulants: Secondary | ICD-10-CM | POA: Diagnosis not present

## 2017-08-18 DIAGNOSIS — I358 Other nonrheumatic aortic valve disorders: Secondary | ICD-10-CM | POA: Diagnosis not present

## 2017-09-08 ENCOUNTER — Ambulatory Visit: Payer: PPO | Admitting: Physical Therapy

## 2017-09-08 ENCOUNTER — Ambulatory Visit: Payer: PPO | Attending: Internal Medicine

## 2017-09-08 ENCOUNTER — Ambulatory Visit: Payer: PPO | Admitting: Occupational Therapy

## 2017-09-08 DIAGNOSIS — R29818 Other symptoms and signs involving the nervous system: Secondary | ICD-10-CM | POA: Insufficient documentation

## 2017-09-08 DIAGNOSIS — R2689 Other abnormalities of gait and mobility: Secondary | ICD-10-CM | POA: Insufficient documentation

## 2017-09-08 DIAGNOSIS — R471 Dysarthria and anarthria: Secondary | ICD-10-CM

## 2017-09-08 DIAGNOSIS — R2681 Unsteadiness on feet: Secondary | ICD-10-CM | POA: Insufficient documentation

## 2017-09-08 NOTE — Therapy (Signed)
[facility] [place] [address] [place], [place], [address] Phone: [phone]   Fax:  [phone]  Patient Details  Name: [name] MRN: [id number] Date of Birth: Jun 30, 1941 Referring Provider:  Leanna Battles, MD  Encounter Date: 09/08/2017   Occupational Therapy Parkinson's Disease Screen  Hand dominance:  right   Physical Performance Test item #2 (simulated eating):  12.25 sec  Physical Performance Test item #4 (donning/doffing jacket):  15.60 sec  Fastening/unfastening 3 buttons in:  55sec  9-hole peg test:    RUE  28.85 sec        LUE  24.53 sec  Box & Blocks Test:   RUE  43 blocks        LUE  48 blocks  Change in ability to perform ADLs/IADLs:  Pt/husband denies  Other Comments:  1-2 falls since last OT d/c (turning RW/kicking wheel, in kitchen backing up).   Pt does not require occupational therapy services at this time.  Recommended occupational therapy screen in   6-8 months.  Educated pt/husband in indication for return to therapy sooner than scheduled screen.  Pt/husband verbalized understanding/agreement.    [facility] 09/08/2017, 8:27 AM  [facility] [address] [place], [place], [address] Phone: [phone]   Fax:  [place], OTR/L [facility] [address] [place], [place]  [phone] phone [phone] 09/08/17 8:27 AM

## 2017-09-08 NOTE — Therapy (Signed)
[place] [address] [place], [place], [address] Phone: [phone]   Fax:  [phone]  Patient Details  Name: [name] MRN: [id number] Date of Birth: [birth year] Referring Provider:  Leanna Battles, MD  Encounter Date: 09/08/2017   Physical Therapy Parkinson's Disease Screen   Timed Up and Go test:19.90 (compared to 18.94 sec)  10 meter walk test:12.6 sec (2.6 ft/sec)  5 time sit to stand test:13.04 sec    Patient does not require Physical Therapy services at this time.  Recommend Physical Therapy screen in 6-9 months.     Frazier Butt. 09/08/2017, 8:21 AM  Frazier Butt., PT   [place] [address] [place], [place], [address] Phone: [phone]   Fax:  [phone]

## 2017-09-08 NOTE — Therapy (Signed)
[facility] [address] [place], [place], [address] Phone: [phone]   Fax:  [phone]  Patient Details  Name: [name] MRN: [id number] Date of Birth: 1941-06-07 Referring Provider: Alonza Bogus, DO  Encounter Date: 09/08/2017  Speech Therapy Parkinson's Disease Screen   Decibel Level today: low 70s dB  (WNL=70-72 dB) with sound level meter 30cm away from pt's mouth. Pt's conversational volume has remained the same since last treatment course and pt/husband agree that pt is no softer than she was earlier this year after her course of ST.  Pt does not report coughing/throat clearing with meals/POs. Pt husband agrees.  Pt does does not require speech therapy services at this time. Recommend ST screen in another 6-8 months    [facility] ,MS, CCC-SLP  09/08/2017, 11:27 AM  [place] [address] [place], [place], [address] Phone: [phone]   Fax:  [phone]

## 2017-09-09 ENCOUNTER — Encounter: Payer: Self-pay | Admitting: Cardiology

## 2017-09-16 NOTE — Progress Notes (Signed)
Office Visit    Patient Name: [name] Date of Encounter: 09/16/2017  Primary Care Provider:  Leanna Battles, MD Primary Cardiologist:  P. Martinique, MD   Chief Complaint    76 y/o ? with a h/o severe AS s/p SJM AVR and PAF seen for follow up.  Past Medical History    Past Medical History:  Diagnosis Date  . Anticoagulated on Coumadin   . Arthropathy of cervical facet joint (HCC)    C2-3  . At risk for falls   . BPV (benign positional vertigo)   . Bruises easily   . Cancer ([place]) 2004    breast-lumpectomy,nodes ,radiation,chemo  . Chronic neck pain   . CKD (chronic kidney disease), stage II   . Diverticulosis of colon   . Elevated troponin    a. 08/2016 in setting of PAF;  b. 08/2016 low risk MV w/ fixed septal defect (LBBB), EF 53%.  Marland Kitchen GERD (gastroesophageal reflux disease)   . Hiatal hernia   . History of aortic valve stenosis   . History of breast cancer per pt no recurrence   dx 2004-- Left breast DCIS (ER/PR+, HER2 negative) s/p  partial masectomy w/ sln bx and  chemoradiotion (completed 2004)  . History of colon polyps   . History of herpes zoster   . History of kidney stones   . History of kidney stones   . HTN (hypertension)   . Hyperlipidemia   . Hypertrophy of inter-atrial septum    a. 09/2014 Echo: ? of atrial mass; b. 10/2014 Cardiac MRI: no atrial septal mass - polypoid lipomatous hypertrophy of superior atrial septum noted-->Benign.  . Irritable bowel syndrome   . LBBB (left bundle branch block)   . PAF (paroxysmal atrial fibrillation) (HCC)    a. CHA2DS2VASc = 4-->coumadin.  . Parkinsonism [facility])    neurologist-  dr tat  . Personal history of chemotherapy 2004  . Personal history of radiation therapy 2004  . PONV (postoperative nausea and vomiting)   . PVC's (premature ventricular contractions)   . Right ureteral stone   . S/P aortic valve replacement with prosthetic valve    a. 04/13/2006 s/p St Jude mechnical AVR for severe AS;  b. 09/2014  Echo: EF 40-45%, gr1 DD, mild AI/MR, triv TR/PR.  . SUI (stress urinary incontinence, female)   . Unstable balance    uses cane  . Venous varices     Past Surgical History:  Procedure Laterality Date  . ANTERIOR AND POSTERIOR REPAIR  1990   cystocele/ rectocele  . AORTIC VALVE REPLACEMENT  04/13/06   dr gerhardt   and Replacement Ascending Aorta (St. Jude mechanical prosthesis)  . APPENDECTOMY  child 1950  . BREAST LUMPECTOMY Left 01/11/2003   malignant  . BREAST SURGERY Left 2004  . CARDIAC CATHETERIZATION  02-22-2001   dr Martinique   minimal non-obstructive cad/  mild aortic stenosis and aortic root enlargement/  normal LVF, ef 65%  . CARDIAC CATHETERIZATION  03-22-2006    dr Martinique   no significant obstructive cad/  severe aortic stenosis and mild to moderate aortic root enlargement/  upper normal right heart pressures  . CARDIOVASCULAR STRESS TEST  09-30-2011   dr Martinique   lexiscan nuclear study w/ apical thinning  vs  small prior apical infarct w/ no significant ischemia/  hypokinesis of the distal septum and apex, ef 50%  . CARPAL TUNNEL RELEASE Bilateral left 09-09-2004/  right 2007  . CATARACT EXTRACTION W/ INTRAOCULAR LENS  IMPLANT, BILATERAL  2015  . COLONOSCOPY N/A 10/03/2014   Procedure: COLONOSCOPY;  Surgeon: [name], MD;  Location: [place];  Service: Endoscopy;  Laterality: N/A;  . CYSTOSCOPY WITH RETROGRADE PYELOGRAM, URETEROSCOPY AND STENT PLACEMENT Right 09/17/2016   Procedure: CYSTOSCOPY WITH RIGHT RETROGRADE PYELOGRAM, RIGHT URETEROSCOPY AND STENT PLACEMENT LASER LITHOTRIPSY, RIGHT STENT PLACEMENT;  Surgeon: [name], MD;  Location[facility];  Service: Urology;  Laterality: Right;  . ESOPHAGEAL MANOMETRY N/A 08/12/2014   Procedure: ESOPHAGEAL MANOMETRY (EM);  Surgeon: [name], MD;  Location: [facility];  Service: Endoscopy;  Laterality: N/A;  . ESOPHAGOGASTRODUODENOSCOPY N/A 10/03/2014   Procedure: ESOPHAGOGASTRODUODENOSCOPY  (EGD);  Surgeon: [name], MD;  Location: [facility];  Service: Endoscopy;  Laterality: N/A;  . [place]  . FOOT SURGERY Right 2013   hammertoe x2  . HOLMIUM LASER APPLICATION Right 2/70/6237   Procedure: HOLMIUM LASER APPLICATION;  Surgeon: [name], MD;  Location: [facility];  Service: Urology;  Laterality: Right;  . PERCUTANEOUS NEPHROSTOLITHOTOMY  1993  . STONE EXTRACTION WITH BASKET Right 09/17/2016   Procedure: STONE EXTRACTION WITH BASKET;  Surgeon: [name], MD;  Location: [facility];  Service: Urology;  Laterality: Right;  . TOTAL ABDOMINAL HYSTERECTOMY  1971   w/ Bilateral Salpingoophorectomy  . TRANSTHORACIC ECHOCARDIOGRAM  10/24/2014   dr Martinique   hypokinesis of the basal and mid inferoseptal and inferior walls,  ef 40-45%,  grade 1 diastolic dysfunction/  mechanical bileaflet aortic valve noted w/ mild regur., peak grandient 49mHg, valve area 1.35cm^2/  severe MV calcification without stenosis w/ mild regurg., peak gradient 379mg/  mild LAE/  poss. atrial mass (MRI showed benign lipomatous hypertrophy of atrial septum) /tFrazier ButtR/mild TR    Allergies  Allergies  Allergen Reactions  . Demerol [Meperidine] Shortness Of Breath and Swelling  . Oxycodone Other (See Comments)    Hallucinations   . Adhesive [Tape] Other (See Comments)    Redness and skin tears  . Ivp Dye [Iodinated Diagnostic Agents] Hives    OK with benadryl pre-med (5043mne hour before receiving iodinated contrast agent)  . Phenergan [Promethazine] Other (See Comments)    Avoids due to reaction with current medications    History of Present Illness    [age]o ? with a h/o severe AS s/p SJM mech AVR in 2007. She also has a h/o PAF and is chronically anticoagulated with coumadin.  Last year was noted to be bradycardic and her  blocker was d/c'd.  Last September following a urologic procedure  she developed abd pain,  nausea, c/p, sob, and palpitations, prompting her to call EMS.  She was found to be in AF with RVR, which later converted to sinus rhythm.  [facility].  She was seen by our team and underwent stress testing, which was low risk.    She was seen in October by Dr KleCaryl Comeso felt her Afib episodes were infrequent and recommended following for now. If episodes became more frequent then consider resumption of beta blocker or AAD without rate slowing effects such as Tikosyn.   She does have a history of atypical Parikinsons and progressive supranuclear palsy followed by Neurology. She had falls in March and June and seen in ED.   On follow up today she is doing well. Notes sometimes when riding her bike her HR will go up to 190. If  she stops and rests it comes back down. This has only happened a couple of times.  No dizziness, chest pain, dyspnea.  She has a mild chronic hacking cough.  Home Medications    Prior to Admission medications   Medication Sig Start Date End Date Taking? Authorizing Provider  amoxicillin-clavulanate (AUGMENTIN) 500-125 MG tablet Take 1 tablet (500 mg total) by mouth 2 (two) times daily. 09/21/16   Florinda Marker, MD  calcium citrate-vitamin D (CITRACAL+D) 315-200 MG-UNIT per tablet Take 1 tablet by mouth 3 (three) times daily.    Historical Provider, MD  carbidopa-levodopa (SINEMET IR) 25-100 MG tablet Take 2 tablets by mouth 3 (three) times daily. 07/06/16   Eustace Quail Tat, DO  Cholecalciferol (VITAMIN D3) 2000 units TABS Take 1 capsule by mouth daily.    Historical Provider, MD  enoxaparin (LOVENOX) 80 MG/0.8ML injection Inject 80 mg into the skin 2 (two) times daily.  09/10/16   Historical Provider, MD  famotidine-calcium carbonate-magnesium hydroxide (PEPCID COMPLETE) 10-800-165 MG CHEW chewable tablet Chew 1 tablet by mouth daily as needed (heartburn).    Historical Provider, MD  Multiple Vitamin (MULTIVITAMIN) tablet Take 1 tablet by  mouth daily at 12 noon.     Historical Provider, MD  Multiple Vitamins-Minerals (VISION-VITE PRESERVE PO) Take 1 tablet by mouth 2 (two) times daily.    Historical Provider, MD  oxybutynin (DITROPAN) 5 MG tablet Take 2.5 mg by mouth 2 (two) times daily.     Historical Provider, MD  pantoprazole (PROTONIX) 40 MG tablet Take 40 mg by mouth every morning.     Historical Provider, MD  phenazopyridine (PYRIDIUM) 200 MG tablet Take 1 tablet (200 mg total) by mouth 3 (three) times daily as needed for pain. 09/17/16   [name], MD  simvastatin (ZOCOR) 20 MG tablet Take 20 mg by mouth every evening.    Historical Provider, MD  sulfamethoxazole-trimethoprim (BACTRIM DS) 800-160 MG tablet Take 1 tablet by mouth daily. 09/21/16   Jessica Ratliff Hoffman, DO  triamterene-hydrochlorothiazide (MAXZIDE-25) 37.5-25 MG per tablet Take 1 tablet by mouth every morning.     Historical Provider, MD  UNABLE TO FIND Take 900 mg by mouth daily at 12 noon. Omega Red     Historical Provider, MD  vitamin B-12 (CYANOCOBALAMIN) 1000 MCG tablet Take 1,000 mcg by mouth every morning.    Historical Provider, MD  warfarin (COUMADIN) 2.5 MG tablet Take 1 tablet (2.5 mg total) by mouth one time only at 6 PM. 09/21/16   Valinda Party, DO  warfarin (COUMADIN) 5 MG tablet Take 5 mg by mouth every evening. 7.5 mg every Tues, Thurs, Sat; 5 mg all other days    Historical Provider, MD    Review of Systems    As noted in HPI.  All other systems reviewed and are otherwise negative except as noted above.  Physical Exam    VS:  There were no vitals taken for this visit. , BMI There is no height or weight on file to calculate BMI. GENERAL:  Well appearing WF HEENT:  PERRL, EOMI, sclera are clear. Oropharynx is clear. NECK:  No jugular venous distention, carotid upstroke brisk and symmetric, no bruits, no thyromegaly or adenopathy LUNGS:  Clear to auscultation bilaterally CHEST:  Unremarkable HEART:  RRR,  PMI not  displaced or sustained,S1  within normal limits, good mechanical S2. no S3, no S4: no clicks, no rubs, gr 2/6 SEM RUSB ABD:  Soft, nontender. BS +, no masses or bruits. No  hepatomegaly, no splenomegaly EXT:  2 + pulses throughout, no edema, no cyanosis no clubbing. Multiple superficial varicosities in LE. Nontender. SKIN:  Warm and dry.  No rashes NEURO:  Alert and oriented x 3. Cranial nerves II through XII intact. PSYCH:  Cognitively intact    Accessory Clinical Findings    Lab Results  Component Value Date   WBC 4.7 09/21/2016   HGB 9.4 (L) 09/21/2016   HCT 28.9 (L) 09/21/2016   PLT 161 09/21/2016   GLUCOSE 124 (H) 09/21/2016   CHOL 145 09/30/2011   TRIG 42 09/30/2011   HDL 77 09/30/2011   LDLCALC 60 09/30/2011   ALT 7 (L) 09/19/2016   AST 36 09/19/2016   NA 135 09/21/2016   K 3.6 09/21/2016   CL 103 09/21/2016   CREATININE 1.58 (H) 09/21/2016   BUN 17 09/21/2016   CO2 25 09/21/2016   INR 2.52 09/21/2016   Labs dated 05/10/16: cholesterol 171, triglycerides 81, HDL 58, LDL 97. Dated 05/19/17: cholesterol 166, triglycerides 73, HDL 63, LDL 88. Creatinine 1.4, Hgb 12.1. ALT normal.  Ecg today shows NSR rate 62. LBBB. I have personally reviewed and interpreted this study.   Assessment & Plan    1.  PAF/Tachy-brady syndrome:  Pt has a h/o PAF, previously managed with metoprolol xl 25 mg daily, however this was stopped due to significant sinus bradycardia with rates in the 40's @ office visit on 9/21.  She developed AFib on 9/23, prompting presentation to [facility].  She converted spontaneously to sinus.  She has infrequent tachycardia noted with exertion.  Will continue to monitor off beta blocker. I will follow up in 6 months.  2.  S/P SJM AVR:  Stable on echo in 2015.  Chronic coumadin. Normal valve click on exam.  3.  Essential HTN:  Stable.  4.  HL:  Fair control. Cont statin.  Blaike Vickers Martinique, MD, [facility] 09/16/2017, 1:09 PM

## 2017-09-22 ENCOUNTER — Encounter: Payer: Self-pay | Admitting: Cardiology

## 2017-09-22 ENCOUNTER — Ambulatory Visit (INDEPENDENT_AMBULATORY_CARE_PROVIDER_SITE_OTHER): Payer: PPO | Admitting: Cardiology

## 2017-09-22 VITALS — BP 142/70 | HR 62 | Ht 59.75 in | Wt 137.0 lb

## 2017-09-22 DIAGNOSIS — Z952 Presence of prosthetic heart valve: Secondary | ICD-10-CM | POA: Diagnosis not present

## 2017-09-22 DIAGNOSIS — I480 Paroxysmal atrial fibrillation: Secondary | ICD-10-CM

## 2017-09-22 DIAGNOSIS — I10 Essential (primary) hypertension: Secondary | ICD-10-CM | POA: Diagnosis not present

## 2017-09-22 DIAGNOSIS — E7800 Pure hypercholesterolemia, unspecified: Secondary | ICD-10-CM

## 2017-09-22 DIAGNOSIS — I358 Other nonrheumatic aortic valve disorders: Secondary | ICD-10-CM | POA: Diagnosis not present

## 2017-09-22 DIAGNOSIS — Z23 Encounter for immunization: Secondary | ICD-10-CM | POA: Diagnosis not present

## 2017-09-22 DIAGNOSIS — Z7901 Long term (current) use of anticoagulants: Secondary | ICD-10-CM | POA: Diagnosis not present

## 2017-09-22 NOTE — Patient Instructions (Addendum)
Continue your current therapy  I will see you in 6 months.   

## 2017-10-20 DIAGNOSIS — Z7901 Long term (current) use of anticoagulants: Secondary | ICD-10-CM | POA: Diagnosis not present

## 2017-10-20 DIAGNOSIS — I358 Other nonrheumatic aortic valve disorders: Secondary | ICD-10-CM | POA: Diagnosis not present

## 2017-11-15 ENCOUNTER — Other Ambulatory Visit: Payer: Self-pay | Admitting: Neurology

## 2017-11-24 DIAGNOSIS — Z7901 Long term (current) use of anticoagulants: Secondary | ICD-10-CM | POA: Diagnosis not present

## 2017-11-24 DIAGNOSIS — I358 Other nonrheumatic aortic valve disorders: Secondary | ICD-10-CM | POA: Diagnosis not present

## 2017-12-06 DIAGNOSIS — Z779 Other contact with and (suspected) exposures hazardous to health: Secondary | ICD-10-CM | POA: Diagnosis not present

## 2017-12-06 DIAGNOSIS — Z6827 Body mass index (BMI) 27.0-27.9, adult: Secondary | ICD-10-CM | POA: Diagnosis not present

## 2017-12-07 ENCOUNTER — Other Ambulatory Visit: Payer: Self-pay | Admitting: Obstetrics and Gynecology

## 2017-12-07 DIAGNOSIS — N63 Unspecified lump in unspecified breast: Secondary | ICD-10-CM

## 2017-12-12 ENCOUNTER — Ambulatory Visit
Admission: RE | Admit: 2017-12-12 | Discharge: 2017-12-12 | Disposition: A | Discharge status: Home / Self Care | Payer: PPO | Source: Ambulatory Visit | Attending: Obstetrics and Gynecology | Admitting: Obstetrics and Gynecology

## 2017-12-12 ENCOUNTER — Other Ambulatory Visit: Payer: PPO

## 2017-12-12 DIAGNOSIS — R928 Other abnormal and inconclusive findings on diagnostic imaging of breast: Secondary | ICD-10-CM | POA: Diagnosis not present

## 2017-12-12 DIAGNOSIS — N6320 Unspecified lump in the left breast, unspecified quadrant: Secondary | ICD-10-CM | POA: Diagnosis not present

## 2017-12-12 DIAGNOSIS — N63 Unspecified lump in unspecified breast: Secondary | ICD-10-CM

## 2017-12-13 ENCOUNTER — Other Ambulatory Visit: Payer: PPO

## 2017-12-26 ENCOUNTER — Emergency Department (HOSPITAL_COMMUNITY)
Admission: EM | Admit: 2017-12-26 | Discharge: 2017-12-26 | Disposition: A | Discharge status: Home / Self Care | Payer: PPO | Attending: Emergency Medicine | Admitting: Emergency Medicine

## 2017-12-26 ENCOUNTER — Other Ambulatory Visit: Payer: Self-pay

## 2017-12-26 ENCOUNTER — Emergency Department (HOSPITAL_COMMUNITY): Payer: PPO

## 2017-12-26 ENCOUNTER — Encounter (HOSPITAL_COMMUNITY): Payer: Self-pay | Admitting: Emergency Medicine

## 2017-12-26 DIAGNOSIS — Y92000 Kitchen of unspecified non-institutional (private) residence as  the place of occurrence of the external cause: Secondary | ICD-10-CM | POA: Diagnosis not present

## 2017-12-26 DIAGNOSIS — G20 Parkinson's disease: Secondary | ICD-10-CM | POA: Diagnosis not present

## 2017-12-26 DIAGNOSIS — W0110XA Fall on same level from slipping, tripping and stumbling with subsequent striking against unspecified object, initial encounter: Secondary | ICD-10-CM | POA: Diagnosis not present

## 2017-12-26 DIAGNOSIS — Z952 Presence of prosthetic heart valve: Secondary | ICD-10-CM | POA: Diagnosis not present

## 2017-12-26 DIAGNOSIS — Y998 Other external cause status: Secondary | ICD-10-CM | POA: Diagnosis not present

## 2017-12-26 DIAGNOSIS — I129 Hypertensive chronic kidney disease with stage 1 through stage 4 chronic kidney disease, or unspecified chronic kidney disease: Secondary | ICD-10-CM | POA: Insufficient documentation

## 2017-12-26 DIAGNOSIS — Z7901 Long term (current) use of anticoagulants: Secondary | ICD-10-CM | POA: Diagnosis not present

## 2017-12-26 DIAGNOSIS — N182 Chronic kidney disease, stage 2 (mild): Secondary | ICD-10-CM | POA: Diagnosis not present

## 2017-12-26 DIAGNOSIS — Y9389 Activity, other specified: Secondary | ICD-10-CM | POA: Diagnosis not present

## 2017-12-26 DIAGNOSIS — S0990XA Unspecified injury of head, initial encounter: Secondary | ICD-10-CM | POA: Diagnosis not present

## 2017-12-26 DIAGNOSIS — Z79899 Other long term (current) drug therapy: Secondary | ICD-10-CM | POA: Insufficient documentation

## 2017-12-26 DIAGNOSIS — Z853 Personal history of malignant neoplasm of breast: Secondary | ICD-10-CM | POA: Diagnosis not present

## 2017-12-26 NOTE — ED Provider Notes (Signed)
[facility] EMERGENCY DEPARTMENT Provider Note   CSN: [id number] Arrival date & time: 12/26/17  1442     History   Chief Complaint Chief Complaint  Patient presents with  . Head Injury    on coumadin    HPI [name] is a 76 y.o. female.  Patient presents to the emergency department with a chief complaint of fall.  She states that she was getting something out of her refrigerator this afternoon and tripped on her walker causing her to fall to the ground and strike her head.  She is anticoagulated with Coumadin.  She denies any loss of consciousness.  Denies any vision changes, slurred speech, numbness, weakness, or tingling.  She states that she feels fine now.  She has not taken anything for her symptoms.  She reports that she has always been encouraged to be seen after a fall or head injury while taking Coumadin.   The history is provided by the patient. No language interpreter was used.    Past Medical History:  Diagnosis Date  . Anticoagulated on Coumadin   . Arthropathy of cervical facet joint    C2-3  . At risk for falls   . BPV (benign positional vertigo)   . Bruises easily   . Cancer ([place]) 2004    breast-lumpectomy,nodes ,radiation,chemo  . Chronic neck pain   . CKD (chronic kidney disease), stage II   . Diverticulosis of colon   . Elevated troponin    a. 08/2016 in setting of PAF;  b. 08/2016 low risk MV w/ fixed septal defect (LBBB), EF 53%.  Marland Kitchen GERD (gastroesophageal reflux disease)   . Hiatal hernia   . History of aortic valve stenosis   . History of breast cancer per pt no recurrence   dx 2004-- Left breast DCIS (ER/PR+, HER2 negative) s/p  partial masectomy w/ sln bx and  chemoradiotion (completed 2004)  . History of colon polyps   . History of herpes zoster   . History of kidney stones   . History of kidney stones   . HTN (hypertension)   . Hyperlipidemia   . Hypertrophy of inter-atrial septum    a. 09/2014 Echo: ? of  atrial mass; b. 10/2014 Cardiac MRI: no atrial septal mass - polypoid lipomatous hypertrophy of superior atrial septum noted-->Benign.  . Irritable bowel syndrome   . LBBB (left bundle branch block)   . PAF (paroxysmal atrial fibrillation) (HCC)    a. CHA2DS2VASc = 4-->coumadin.  . Parkinsonism [facility])    neurologist-  dr tat  . Personal history of chemotherapy 2004  . Personal history of radiation therapy 2004  . PONV (postoperative nausea and vomiting)   . PVC's (premature ventricular contractions)   . Right ureteral stone   . S/P aortic valve replacement with prosthetic valve    a. 04/13/2006 s/p St Jude mechnical AVR for severe AS;  b. 09/2014 Echo: EF 40-45%, gr1 DD, mild AI/MR, triv TR/PR.  . SUI (stress urinary incontinence, female)   . Unstable balance    uses cane  . Venous varices      Patient Active Problem List   Diagnosis Date Noted  . Elevated troponin 09/19/2016  . Emesis 09/19/2016  . UTI (urinary tract infection) 09/19/2016  . Sinus bradycardia 09/16/2016  . Family history of colon cancer 07/30/2014  . Hiatal hernia 07/30/2014  . Chest pain 10/13/2011  . Disequilibrium 10/13/2011  . Heart valve replaced by other means 04/16/2011  . Chronic anticoagulation 03/17/2011  .  S/P AVR (aortic valve replacement)   . PVC's (premature ventricular contractions)   . HTN (hypertension)   . Hypercholesteremia   . Atrial fibrillation with RVR ([place])   . GERD (gastroesophageal reflux disease)   . GERD 02/06/2010  . DIVERTICULOSIS-COLON 02/06/2010  . PERSONAL HX COLONIC POLYPS 02/06/2010  . MASTECTOMY, HX OF 02/06/2010    Past Surgical History:  Procedure Laterality Date  . ANTERIOR AND POSTERIOR REPAIR  1990   cystocele/ rectocele  . AORTIC VALVE REPLACEMENT  04/13/06   dr gerhardt   and Replacement Ascending Aorta (St. Jude mechanical prosthesis)  . APPENDECTOMY  child 1950  . BREAST LUMPECTOMY Left 01/11/2003   malignant  . BREAST SURGERY Left 2004  . CARDIAC  CATHETERIZATION  02-22-2001   dr Martinique   minimal non-obstructive cad/  mild aortic stenosis and aortic root enlargement/  normal LVF, ef 65%  . CARDIAC CATHETERIZATION  03-22-2006    dr Martinique   no significant obstructive cad/  severe aortic stenosis and mild to moderate aortic root enlargement/  upper normal right heart pressures  . CARDIOVASCULAR STRESS TEST  09-30-2011   dr Martinique   lexiscan nuclear study w/ apical thinning  vs  small prior apical infarct w/ no significant ischemia/  hypokinesis of the distal septum and apex, ef 50%  . CARPAL TUNNEL RELEASE Bilateral left 09-09-2004/  right 2007  . CATARACT EXTRACTION W/ INTRAOCULAR LENS  IMPLANT, BILATERAL  2015  . COLONOSCOPY N/A 10/03/2014   Procedure: COLONOSCOPY;  Surgeon: [name], MD;  Location: [facility];  Service: Endoscopy;  Laterality: N/A;  . CYSTOSCOPY WITH RETROGRADE PYELOGRAM, URETEROSCOPY AND STENT PLACEMENT Right 09/17/2016   Procedure: CYSTOSCOPY WITH RIGHT RETROGRADE PYELOGRAM, RIGHT URETEROSCOPY AND STENT PLACEMENT LASER LITHOTRIPSY, RIGHT STENT PLACEMENT;  Surgeon: [name], MD;  Location: [facility];  Service: Urology;  Laterality: Right;  . ESOPHAGEAL MANOMETRY N/A 08/12/2014   Procedure: ESOPHAGEAL MANOMETRY (EM);  Surgeon: [name], MD;  Location: [facility];  Service: Endoscopy;  Laterality: N/A;  . ESOPHAGOGASTRODUODENOSCOPY N/A 10/03/2014   Procedure: ESOPHAGOGASTRODUODENOSCOPY (EGD);  Surgeon: [name], MD;  Location: [facility] ENDOSCOPY;  Service: Endoscopy;  Laterality: N/A;  . [place]  . FOOT SURGERY Right 2013   hammertoe x2  . HOLMIUM LASER APPLICATION Right 5/45/6256   Procedure: HOLMIUM LASER APPLICATION;  Surgeon: [name], MD;  Location: [facility];  Service: Urology;  Laterality: Right;  . PERCUTANEOUS NEPHROSTOLITHOTOMY  1993  . STONE EXTRACTION WITH BASKET Right 09/17/2016   Procedure: STONE EXTRACTION  WITH BASKET;  Surgeon: [name], MD;  Location: [facility];  Service: Urology;  Laterality: Right;  . TOTAL ABDOMINAL HYSTERECTOMY  1971   w/ Bilateral Salpingoophorectomy  . TRANSTHORACIC ECHOCARDIOGRAM  10/24/2014   dr Martinique   hypokinesis of the basal and mid inferoseptal and inferior walls,  ef 40-45%,  grade 1 diastolic dysfunction/  mechanical bileaflet aortic valve noted w/ mild regur., peak grandient 24mHg, valve area 1.35cm^2/  severe MV calcification without stenosis w/ mild regurg., peak gradient 371mg/  mild LAE/  poss. atrial mass (MRI showed benign lipomatous hypertrophy of atrial septum) /tFrazier ButtR/mild TR    OB History    No data available       Home Medications    Prior to Admission medications   Medication Sig Start Date End Date Taking? Authorizing Provider  acetaminophen (TYLENOL) 500 MG tablet Take 1,000 mg by mouth every  6 (six) hours as needed (head pain).     [provider]  calcium carbonate (TUMS - DOSED IN MG ELEMENTAL CALCIUM) 500 MG chewable tablet Chew 1 tablet by mouth daily.    [provider]  calcium citrate-vitamin D (CITRACAL+D) 315-200 MG-UNIT per tablet Take 1 tablet by mouth 3 (three) times daily.    [provider]  carbidopa-levodopa (SINEMET IR) 25-100 MG tablet TAKE 2 TABLETS BY MOUTH THREE TIMES DAILY 11/16/17   Tat, Eustace Quail, DO  Cholecalciferol (VITAMIN D3) 2000 units TABS Take 1 capsule by mouth daily.    [provider]  famotidine-calcium carbonate-magnesium hydroxide (PEPCID COMPLETE) 10-800-165 MG CHEW chewable tablet Chew 1 tablet by mouth daily as needed (heartburn).    [provider]  KLOR-CON M20 20 MEQ tablet Take 20 mEq by mouth daily. 05/26/17   [provider]  KRILL OIL PO Take by mouth.    [provider]  Melatonin 3 MG TABS Take by mouth.    [provider]  Multiple Vitamin (MULTIVITAMIN) tablet Take 1 tablet by mouth daily at  12 noon.     [provider]  Multiple Vitamins-Minerals (VISION-VITE PRESERVE PO) Take 1 tablet by mouth 2 (two) times daily.    [provider]  pantoprazole (PROTONIX) 40 MG tablet Take 40 mg by mouth daily. 06/07/17   [provider]  simvastatin (ZOCOR) 20 MG tablet Take 20 mg by mouth every evening.    [provider]  triamterene-hydrochlorothiazide (MAXZIDE-25) 37.5-25 MG per tablet Take 1 tablet by mouth every morning.     [provider]  vitamin B-12 (CYANOCOBALAMIN) 1000 MCG tablet Take 1,000 mcg by mouth every morning.    [provider]  warfarin (COUMADIN) 5 MG tablet Take 5 mg by mouth every evening. Take 7.5 mg by mouth every Tues and Sat; take 5 mg by mouth all other days    [provider]    Family History Family History  Problem Relation Age of Onset  . Heart disease Mother   . Heart disease Father 3       CABG  . Other Brother        AVR    Social History Social History   Tobacco Use  . Smoking status: Never Smoker  . Smokeless tobacco: Never Used  Substance Use Topics  . Alcohol use: No    Alcohol/week: 0.0 oz  . Drug use: No     Allergies   Demerol [meperidine]; Oxycodone; Adhesive [tape]; Ivp dye [iodinated diagnostic agents]; and Phenergan [promethazine]   Review of Systems Review of Systems  All other systems reviewed and are negative.    Physical Exam Updated Vital Signs BP (!) 154/64 (BP Location: Right Arm)   Pulse 64   Temp 98.7 F (37.1 C) (Oral)   Resp 16   SpO2 99%   Physical Exam  Constitutional: She is oriented to person, place, and time. She appears well-developed and well-nourished.  HENT:  Head: Normocephalic and atraumatic.  Right Ear: External ear normal.  Left Ear: External ear normal.  Eyes: Conjunctivae and EOM are normal. Pupils are equal, round, and reactive to light.  Neck: Normal range of motion. Neck supple.  No pain with neck flexion, no  meningismus  No bony step-off or deformity, no tenderness to palpation  Cardiovascular: Normal rate, regular rhythm and normal heart sounds. Exam reveals no gallop and no friction rub.  No murmur heard. Pulmonary/Chest: Effort normal and breath sounds normal.  No respiratory distress. She has no wheezes. She has no rales. She exhibits no tenderness.  Abdominal: Soft. Bowel sounds are normal. She exhibits no distension and no mass. There is no tenderness. There is no rebound and no guarding.  Musculoskeletal: Normal range of motion. She exhibits no edema or tenderness.  Normal gait.  Neurological: She is alert and oriented to person, place, and time. She has normal reflexes.  CN 3-12 intact, normal finger to nose, no pronator drift, sensation and strength intact bilaterally.  Skin: Skin is warm and dry.  Psychiatric: She has a normal mood and affect. Her behavior is normal. Judgment and thought content normal.  Nursing note and vitals reviewed.    ED Treatments / Results  Labs (all labs ordered are listed, but only abnormal results are displayed) Labs Reviewed - No data to display  EKG  EKG Interpretation None       Radiology Ct Head Wo Contrast  Result Date: 12/26/2017 CLINICAL DATA:  Tripped over her walker and fell today striking her head, minor head trauma, patient on Coumadin, history hypertension, atrial fibrillation EXAM: CT HEAD WITHOUT CONTRAST TECHNIQUE: Contiguous axial images were obtained from the base of the skull through the vertex without intravenous contrast. Sagittal and coronal MPR images reconstructed from axial data set. COMPARISON:  06/03/2017 FINDINGS: Brain: Generalized atrophy. Normal ventricular morphology. No midline shift or mass effect. Otherwise normal appearance of brain parenchyma. No intracranial hemorrhage, mass lesion, or evidence of acute infarction. No extra-axial fluid collections. Vascular: Atherosclerotic calcification of internal carotid and  vertebral arteries at skullbase Skull: Demineralized but intact Sinuses/Orbits: Clear Other: N/A IMPRESSION: No acute intracranial abnormalities. Electronically Signed   By: Lavonia Dana M.D.   On: 12/26/2017 17:19    Procedures Procedures (including critical care time)  Medications Ordered in ED Medications - No data to display   Initial Impression / Assessment and Plan / ED Course  I have reviewed the triage vital signs and the nursing notes.  Pertinent labs & imaging results that were available during my care of the patient were reviewed by me and considered in my medical decision making (see chart for details).     Patient with mechanical fall at home.  She hit her head at approximately 1 PM this afternoon.  There was no loss of consciousness.  She is neurovascularly intact.  She is anticoagulated.  CT head is negative.  Discussed possibility of delayed bleeding.  Patient is stable and ready for discharge.  Return precautions given.  Final Clinical Impressions(s) / ED Diagnoses   Final diagnoses:  Injury of head, initial encounter    ED Discharge Orders    None       Montine Circle, PA-C 12/26/17 2236    Sherwood Gambler, MD 12/27/17 [phone]

## 2017-12-26 NOTE — ED Triage Notes (Signed)
Pt reports she tripped and fell, hitting her head in the kitchen, denies any syncope. Takes coumadin. A/ox4, resp e/u, nad.

## 2017-12-26 NOTE — ED Notes (Signed)
Patient's husband inquiring about wait. Updated pt and family on wait time and delay. Patient remains A&O x 4. Denies N/V.

## 2017-12-26 NOTE — ED Notes (Signed)
Pt stable, ambulatory, states understanding of discharge instructions 

## 2017-12-29 DIAGNOSIS — I358 Other nonrheumatic aortic valve disorders: Secondary | ICD-10-CM | POA: Diagnosis not present

## 2017-12-29 DIAGNOSIS — Z7901 Long term (current) use of anticoagulants: Secondary | ICD-10-CM | POA: Diagnosis not present

## 2018-01-03 DIAGNOSIS — S0990XA Unspecified injury of head, initial encounter: Secondary | ICD-10-CM | POA: Diagnosis not present

## 2018-01-03 DIAGNOSIS — W19XXXA Unspecified fall, initial encounter: Secondary | ICD-10-CM | POA: Diagnosis not present

## 2018-01-03 DIAGNOSIS — M546 Pain in thoracic spine: Secondary | ICD-10-CM | POA: Diagnosis not present

## 2018-01-03 DIAGNOSIS — Z6827 Body mass index (BMI) 27.0-27.9, adult: Secondary | ICD-10-CM | POA: Diagnosis not present

## 2018-01-10 NOTE — Progress Notes (Signed)
[name] was seen today in the movement disorders clinic for neurologic consultation at the request of Dr Tomi Likens for a 2nd opinion regarding gait instability.  She is accompanied by her husband who supplements the hx.  The records that were made available to me were reviewed.  Pt reports that she has been having falls, the first of which was several years ago but then she began to have more regular falls 6-8 months ago.  Most falls are when she turns and she notes that the feet will shuffle and the "body will turn and the feet will stay."  Her husband states that her feet will stay like "they are stuck on bumble gum."  No falls in last 2 weeks.  She has been in rehab recently.    07/06/16 update:  The patient is following up today regarding probable PSP.  Went to the atypical symposium I had recommended and they thought that it was very informative.  She had a modified barium swallow on 05/20/2016 that recommended a regular diet with thin liquids.  She has been participating with physical and occupational therapy since last visit.  I started her on levodopa (6am/12noon/5pm) and she has not noticed a great difference with that. No SE.  Has had 2 falls.  With one she tripped over her cane and with one she fell changing her sheets.   This was very low dose, and a benefit was not expected at this low dose.  She did see Dr. Rexene Alberts in 06/22/2016 for sleep apnea.  A sleep study was recommended.  Pt states that she hasn't scheduled that yet.  States that she had an injection in neck for pain 2 weeks ago and thinks that it has helped some.  States that she is contemplating cervical spine surgery.  She did have an MRI of the cervical spine on 06/03/2016 that demonstrated severe C2-C3 left facet arthropathy with joint effusion and bone marrow edema.  11/23/16 update:  Pt f/u today.  Last visit, I increased her carbidopa/levodopa 25/100 to 2 po tid.  She states that she noted no great difference with that.   Multiple falls over the last few weeks (5) for no reason.  One time she tripped over a threshold.  One time she fell in a bathroom.  One time she was working with the christmas tree.  All the falls except one were forward.   Unfortunately, the PSP study that was supposed to be done local had a site change and the closest site was [place], [place].  They also were told that she wasn't a good candidate because of the coumadin.  Had 2 kidney stones since last visit.  On oxybutinin.  Seeing urology this afternoon.  04/07/17 update:  Patient follows up today.  She is on carbidopa/levodopa 25/100, 2 tablets 3 times per day.  She has had 2   falls since our last visit.  She was in the emergency room on 03/19/2017 after she fell backward and hit her head.  She was stepping backwards to put trash in the trashcan.  She had the walker but was going backward.  I reviewed the emergency room notes.  I also reviewed her CT of the brain which was done that day.  It was unremarkable and demonstrated chronic white matter disease.  The other fall was in the bathroom and she was turning around to look at her hair.  She hit her bottom.  She has attended speech, occupational and physical therapy  since our last visit and those notes were reviewed.  She has had 1 day of lightheadedness but no near syncope.  She states that her mood has been good.  She is sleeping well.  She has had some intermittent diplopia.  08/11/17 update:  Patient seen in follow-up today for her atypical parkinsonism.  This patient is accompanied in the office by her spouse who supplements the history.  Patient is on carbidopa/levodopa 25/100, 2 tablets 3 times per day.  She has had one fall since last visit.  I have reviewed records made available to me.  She was in the emergency room on June 8 with a fall.  The fall actually happened a week and a half prior to her presentation to the emergency room.  She was trying to back up and fell over the walker.  She had a  CT of the brain and cervical spine.  With the exception of a posterior scalp hematoma, it was otherwise unremarkable.   She was given a RX for metoclopramide and husband asks me about why that is.  They didn't get it filled because they read it shouldn't be taken if patient has parkinsonism.  Husband states she had no nausea.  Using walker and no other falls besides one described.  They changed to a lower bed because was having trouble getting up on other one.  She continues to have some intermittent diplopia.  Since our last visit, the patient did see her urologist and she states that she had a kidney stone removed.  01/12/18 update: Patient is seen today in follow-up for atypical parkinsonism.  She is accompanied by her husband who supplements history.  She is on carbidopa/levodopa 25/100, 2 tablets 3 times per day.  I have reviewed records made available to me since last visit.  She was in the emergency room on December 26, 2017 after a fall.  She was trying to get something out of the refrigerator and tripped over her walker and fell and hit her head.  CT of the brain was completed and was nonacute.  No other falls besides yesterday.  No issues with swallowing.  No diplopia.  She is doing the bike for exercise but only 5 min at a time, twice per day.  Her cardiology notes from Dr. Martinique were reviewed as well.  PREVIOUS MEDICATIONS: none to date  ALLERGIES:   Allergies  Allergen Reactions  . Demerol [Meperidine] Shortness Of Breath and Swelling  . Oxycodone Other (See Comments)    Hallucinations   . Adhesive [Tape] Other (See Comments)    Redness and skin tears  . Ivp Dye [Iodinated Diagnostic Agents] Hives    OK with benadryl pre-med (50m one hour before receiving iodinated contrast agent)  . Phenergan [Promethazine] Other (See Comments)    Avoids due to reaction with current medications    CURRENT MEDICATIONS:  Outpatient Encounter Medications as of 01/12/2018  Medication Sig  .  acetaminophen (TYLENOL) 500 MG tablet Take 1,000 mg by mouth every 6 (six) hours as needed (head pain).   . calcium carbonate (TUMS - DOSED IN MG ELEMENTAL CALCIUM) 500 MG chewable tablet Chew 1 tablet by mouth daily.  . calcium citrate-vitamin D (CITRACAL+D) 315-200 MG-UNIT per tablet Take 1 tablet by mouth 3 (three) times daily.  . carbidopa-levodopa (SINEMET IR) 25-100 MG tablet TAKE 2 TABLETS BY MOUTH THREE TIMES DAILY  . Cholecalciferol (VITAMIN D3) 2000 units TABS Take 1 capsule by mouth daily.  . famotidine-calcium carbonate-magnesium hydroxide (PEPCID  COMPLETE) 10-800-165 MG CHEW chewable tablet Chew 1 tablet by mouth daily as needed (heartburn).  Marland Kitchen KLOR-CON M20 20 MEQ tablet Take 20 mEq by mouth daily.  Marland Kitchen KRILL OIL PO Take by mouth.  . Melatonin 3 MG TABS Take by mouth.  . Multiple Vitamin (MULTIVITAMIN) tablet Take 1 tablet by mouth daily at 12 noon.   . Multiple Vitamins-Minerals (VISION-VITE PRESERVE PO) Take 1 tablet by mouth 2 (two) times daily.  . pantoprazole (PROTONIX) 40 MG tablet Take 40 mg by mouth daily.  . simvastatin (ZOCOR) 20 MG tablet Take 20 mg by mouth every evening.  . triamterene-hydrochlorothiazide (MAXZIDE-25) 37.5-25 MG per tablet Take 1 tablet by mouth every morning.   . vitamin B-12 (CYANOCOBALAMIN) 1000 MCG tablet Take 1,000 mcg by mouth every morning.  . warfarin (COUMADIN) 5 MG tablet Take 5 mg by mouth every evening. Take 7.5 mg by mouth every Tues and Sat; take 5 mg by mouth all other days   No facility-administered encounter medications on file as of 01/12/2018.     PAST MEDICAL HISTORY:   Past Medical History:  Diagnosis Date  . Anticoagulated on Coumadin   . Arthropathy of cervical facet joint    C2-3  . At risk for falls   . BPV (benign positional vertigo)   . Bruises easily   . Cancer ([facility]) 2004    breast-lumpectomy,nodes ,radiation,chemo  . Chronic neck pain   . CKD (chronic kidney disease), stage II   . Diverticulosis of colon   .  Elevated troponin    a. 08/2016 in setting of PAF;  b. 08/2016 low risk MV w/ fixed septal defect (LBBB), EF 53%.  Marland Kitchen GERD (gastroesophageal reflux disease)   . Hiatal hernia   . History of aortic valve stenosis   . History of breast cancer per pt no recurrence   dx 2004-- Left breast DCIS (ER/PR+, HER2 negative) s/p  partial masectomy w/ sln bx and  chemoradiotion (completed 2004)  . History of colon polyps   . History of herpes zoster   . History of kidney stones   . History of kidney stones   . HTN (hypertension)   . Hyperlipidemia   . Hypertrophy of inter-atrial septum    a. 09/2014 Echo: ? of atrial mass; b. 10/2014 Cardiac MRI: no atrial septal mass - polypoid lipomatous hypertrophy of superior atrial septum noted-->Benign.  . Irritable bowel syndrome   . LBBB (left bundle branch block)   . PAF (paroxysmal atrial fibrillation) (HCC)    a. CHA2DS2VASc = 4-->coumadin.  . Parkinsonism [facility])    neurologist-  dr Winston Sobczyk  . Personal history of chemotherapy 2004  . Personal history of radiation therapy 2004  . PONV (postoperative nausea and vomiting)   . PVC's (premature ventricular contractions)   . Right ureteral stone   . S/P aortic valve replacement with prosthetic valve    a. 04/13/2006 s/p St Jude mechnical AVR for severe AS;  b. 09/2014 Echo: EF 40-45%, gr1 DD, mild AI/MR, triv TR/PR.  . SUI (stress urinary incontinence, female)   . Unstable balance    uses cane  . Venous varices      PAST SURGICAL HISTORY:   Past Surgical History:  Procedure Laterality Date  . ANTERIOR AND POSTERIOR REPAIR  1990   cystocele/ rectocele  . AORTIC VALVE REPLACEMENT  04/13/06   dr gerhardt   and Replacement Ascending Aorta (St. Jude mechanical prosthesis)  . APPENDECTOMY  child 1950  . BREAST LUMPECTOMY Left 01/11/2003  malignant  . BREAST SURGERY Left 2004  . CARDIAC CATHETERIZATION  02-22-2001   dr Martinique   minimal non-obstructive cad/  mild aortic stenosis and aortic root enlargement/   normal LVF, ef 65%  . CARDIAC CATHETERIZATION  03-22-2006    dr Martinique   no significant obstructive cad/  severe aortic stenosis and mild to moderate aortic root enlargement/  upper normal right heart pressures  . CARDIOVASCULAR STRESS TEST  09-30-2011   dr Martinique   lexiscan nuclear study w/ apical thinning  vs  small prior apical infarct w/ no significant ischemia/  hypokinesis of the distal septum and apex, ef 50%  . CARPAL TUNNEL RELEASE Bilateral left 09-09-2004/  right 2007  . CATARACT EXTRACTION W/ INTRAOCULAR LENS  IMPLANT, BILATERAL  2015  . COLONOSCOPY N/A 10/03/2014   Procedure: COLONOSCOPY;  Surgeon: [name], MD;  Location: [facility];  Service: Endoscopy;  Laterality: N/A;  . CYSTOSCOPY WITH RETROGRADE PYELOGRAM, URETEROSCOPY AND STENT PLACEMENT Right 09/17/2016   Procedure: CYSTOSCOPY WITH RIGHT RETROGRADE PYELOGRAM, RIGHT URETEROSCOPY AND STENT PLACEMENT LASER LITHOTRIPSY, RIGHT STENT PLACEMENT;  Surgeon: [name], MD;  Location[facility];  Service: Urology;  Laterality: Right;  . ESOPHAGEAL MANOMETRY N/A 08/12/2014   Procedure: ESOPHAGEAL MANOMETRY (EM);  Surgeon: [name], MD;  Location: [facility] ENDOSCOPY;  Service: Endoscopy;  Laterality: N/A;  . ESOPHAGOGASTRODUODENOSCOPY N/A 10/03/2014   Procedure: ESOPHAGOGASTRODUODENOSCOPY (EGD);  Surgeon: [name], MD;  Location[facility] ENDOSCOPY;  Service: Endoscopy;  Laterality: N/A;  . [place]  . FOOT SURGERY Right 2013   hammertoe x2  . HOLMIUM LASER APPLICATION Right 6/83/4196   Procedure: HOLMIUM LASER APPLICATION;  Surgeon: [name], MD;  Location: [facility];  Service: Urology;  Laterality: Right;  . PERCUTANEOUS NEPHROSTOLITHOTOMY  1993  . STONE EXTRACTION WITH BASKET Right 09/17/2016   Procedure: STONE EXTRACTION WITH BASKET;  Surgeon: [name], MD;  Location: [facility];  Service: Urology;  Laterality:  Right;  . TOTAL ABDOMINAL HYSTERECTOMY  1971   w/ Bilateral Salpingoophorectomy  . TRANSTHORACIC ECHOCARDIOGRAM  10/24/2014   dr Martinique   hypokinesis of the basal and mid inferoseptal and inferior walls,  ef 40-45%,  grade 1 diastolic dysfunction/  mechanical bileaflet aortic valve noted w/ mild regur., peak grandient 44mHg, valve area 1.35cm^2/  severe MV calcification without stenosis w/ mild regurg., peak gradient 372mg/  mild LAE/  poss. atrial mass (MRI showed benign lipomatous hypertrophy of atrial septum) /tFrazier ButtR/mild TR    SOCIAL HISTORY:   Social History   Socioeconomic History  . Marital status: Married    Spouse name: Not on file  . Number of children: 3  . Years of education: 10  . Highest education level: Not on file  Social Needs  . Financial resource strain: Not on file  . Food insecurity - worry: Not on file  . Food insecurity - inability: Not on file  . Transportation needs - medical: Not on file  . Transportation needs - non-medical: Not on file  Occupational History  . Occupation: baPress photographerOTHER    Comment: retired  Tobacco Use  . Smoking status: Never Smoker  . Smokeless tobacco: Never Used  Substance and Sexual Activity  . Alcohol use: No    Alcohol/week: 0.0 oz  . Drug use: No  . Sexual activity: Not on file  Other Topics Concern  . Not on file  Social  History Narrative      1/2 -1 soda a day      FAMILY HISTORY:   Family Status  Relation Name Status  . Mother  Deceased at age 83  . Father  Deceased at age 64  . Brother  Deceased       colon cancer  . Brother  Deceased       accident  . Brother  Deceased       accident  . Brother  Alive       colon cancer  . Brother  Other  . Sister  Alive  . Sister  Alive  . Sister  Alive  . MGM  Deceased  . MGF  Deceased  . PGM  Deceased  . PGF  Deceased  . Brother  (Not Specified)    ROS:  A complete 10 system review of systems was obtained and was unremarkable apart  from what is mentioned above.  PHYSICAL EXAMINATION:    VITALS:   Vitals:   01/12/18 0801  BP: 120/74  Pulse: 68  SpO2: 95%  Weight: 135 lb (61.2 kg)  Height: 4' 11.75" (1.518 m)    GEN:  The patient appears stated age and is in NAD. HEENT:  Normocephalic, atraumatic.  The mucous membranes are moist. The superficial temporal arteries are without ropiness or tenderness. CV:  regular rhythm with 3/6 SEM Lungs:  CTAB Neck/HEME:  There are no carotid bruits bilaterally.  Head is turned to the left and she has hand on chin (geste antagoniste)  Neurological examination:  Orientation: The patient is alert and oriented x3.  Cranial nerves: There is mild R facial droop at rest but able to activate the muscles well.  There is facial hypomimia.   There is occasional L hemifacial spasm. Extraocular muscles are intact. No square wave jerks.  The visual fields are full to confrontational testing. The speech is fluent and clear. She is hypophonic.  Minimal trouble with gutteral sounds.  Soft palate rises symmetrically and there is no tongue deviation. Hearing is intact to conversational tone. Sensation: Sensation is intact to light touch throughout. Motor: Strength is 5/5 in the bilateral upper and lower extremities.   Shoulder shrug is equal and symmetric.  There is no pronator drift.   Movement examination: Tone: There is normal tone in the bilateral upper extremities.  The tone in the lower extremities is normal.  Abnormal movements: no tremor today.  There is some facial dyskinesia Coordination:  There is decremation with RAM's, seen more in the bilateral LE than the UPE Gait and Station: The patient pushes off of the chair. The patient drags the L leg and is unsteady.  She shuffles and is short stepped.    ASSESSMENT/PLAN:  1.  Probable PSP  -I do not think that the patient has idiopathic Parkinsons Disease but rather one of the atypical parkinsonian states, and possibly Progressive  Supranuclear Palsy (PSP).  We talked about nature, etiology and pathophysiology. We talked about how the symptoms, course and prognosis differ from Parkinson's Disease.  We talked about the risks, particularly for falls and aspiration.   -Looks better on levodopa and I am going to continue carbidopa/levodopa 25/100, 2 tablets 3 times per day.    -seeing urology and doing better.  Had stent placed for stone  -needs to use walker at all times!!! Reiterated this today.  Talked about not backing up.  Talked about the fact that someday she will someday need to stay seated and  will be wheelchair bound.   Discussed again in detail.     -MBE done on 05/20/16 was normal.  She denies dysphagia so we are not repeating this.  -handicap form filled out  -encouraged to come to new atypical support group and information given on this.    -gave information on new social worker present in the office 2.  Neck pain  -I think this is multifactorial.  She did have an MRI of the cervical spine on 06/03/2016 that demonstrated severe C2-C3 left facet arthropathy with joint effusion and bone marrow edema.  She has received injections for this.  She also has evidence of cervical dystonia, although this surprisingly has improved some with levodopa. Neck pain is better 3.  Follow up is anticipated in the next 4-5 months, sooner should new neurologic issues arise.  Much greater than 50% of this visit was spent in counseling and coordinating care.  Total face to face time:  25 min

## 2018-01-12 ENCOUNTER — Encounter: Payer: Self-pay | Admitting: Neurology

## 2018-01-12 ENCOUNTER — Ambulatory Visit: Payer: PPO | Admitting: Neurology

## 2018-01-12 VITALS — BP 120/74 | HR 68 | Ht 59.75 in | Wt 135.0 lb

## 2018-01-12 DIAGNOSIS — G231 Progressive supranuclear ophthalmoplegia [Steele-Richardson-Olszewski]: Secondary | ICD-10-CM

## 2018-01-19 DIAGNOSIS — N6459 Other signs and symptoms in breast: Secondary | ICD-10-CM | POA: Diagnosis not present

## 2018-01-31 DIAGNOSIS — Z7901 Long term (current) use of anticoagulants: Secondary | ICD-10-CM | POA: Diagnosis not present

## 2018-01-31 DIAGNOSIS — I358 Other nonrheumatic aortic valve disorders: Secondary | ICD-10-CM | POA: Diagnosis not present

## 2018-02-12 ENCOUNTER — Encounter (HOSPITAL_BASED_OUTPATIENT_CLINIC_OR_DEPARTMENT_OTHER): Payer: Self-pay | Admitting: Emergency Medicine

## 2018-02-12 ENCOUNTER — Emergency Department (HOSPITAL_BASED_OUTPATIENT_CLINIC_OR_DEPARTMENT_OTHER)
Admission: EM | Admit: 2018-02-12 | Discharge: 2018-02-12 | Disposition: A | Discharge status: Home / Self Care | Payer: PPO | Attending: Emergency Medicine | Admitting: Emergency Medicine

## 2018-02-12 ENCOUNTER — Emergency Department (HOSPITAL_BASED_OUTPATIENT_CLINIC_OR_DEPARTMENT_OTHER): Payer: PPO

## 2018-02-12 ENCOUNTER — Other Ambulatory Visit: Payer: Self-pay

## 2018-02-12 DIAGNOSIS — W19XXXA Unspecified fall, initial encounter: Secondary | ICD-10-CM | POA: Diagnosis not present

## 2018-02-12 DIAGNOSIS — Y9389 Activity, other specified: Secondary | ICD-10-CM | POA: Diagnosis not present

## 2018-02-12 DIAGNOSIS — I10 Essential (primary) hypertension: Secondary | ICD-10-CM | POA: Insufficient documentation

## 2018-02-12 DIAGNOSIS — Z7901 Long term (current) use of anticoagulants: Secondary | ICD-10-CM | POA: Insufficient documentation

## 2018-02-12 DIAGNOSIS — S199XXA Unspecified injury of neck, initial encounter: Secondary | ICD-10-CM | POA: Diagnosis not present

## 2018-02-12 DIAGNOSIS — Z79899 Other long term (current) drug therapy: Secondary | ICD-10-CM | POA: Insufficient documentation

## 2018-02-12 DIAGNOSIS — M542 Cervicalgia: Secondary | ICD-10-CM | POA: Diagnosis not present

## 2018-02-12 DIAGNOSIS — Z952 Presence of prosthetic heart valve: Secondary | ICD-10-CM | POA: Insufficient documentation

## 2018-02-12 DIAGNOSIS — Y999 Unspecified external cause status: Secondary | ICD-10-CM | POA: Insufficient documentation

## 2018-02-12 DIAGNOSIS — R51 Headache: Secondary | ICD-10-CM | POA: Diagnosis not present

## 2018-02-12 DIAGNOSIS — R110 Nausea: Secondary | ICD-10-CM | POA: Diagnosis not present

## 2018-02-12 DIAGNOSIS — G20 Parkinson's disease: Secondary | ICD-10-CM | POA: Insufficient documentation

## 2018-02-12 DIAGNOSIS — Y929 Unspecified place or not applicable: Secondary | ICD-10-CM | POA: Diagnosis not present

## 2018-02-12 DIAGNOSIS — S0003XA Contusion of scalp, initial encounter: Secondary | ICD-10-CM

## 2018-02-12 DIAGNOSIS — S0990XA Unspecified injury of head, initial encounter: Secondary | ICD-10-CM | POA: Diagnosis not present

## 2018-02-12 LAB — PROTIME-INR
INR: 2.25
Prothrombin Time: 24.7 seconds — ABNORMAL HIGH (ref 11.4–15.2)

## 2018-02-12 NOTE — ED Provider Notes (Signed)
[facility] EMERGENCY DEPARTMENT Provider Note   CSN: [id number] Arrival date & time: 02/12/18  1647     History   Chief Complaint Chief Complaint  Patient presents with  . Fall    HPI [name] is a 77 y.o. female.  HPI  77 year old female who is on warfarin for a mitral valve replacement presents with a fall.  She has parkinsonism with chronic trouble walking and being off balance.  She usually uses a walker.  Today she let go of the walker to get the purse off around her neck and fell backwards.  She struck her head on a door frame.  Has a large knot to the back of her head but no bleeding.  She states when she first fell she had some periorbital pain on her right eye but no ocular pain.  Some transient blurry vision that went away.  Some left-sided neck pain near her head.  Her headache is about a 7/10.  She took Tylenol prior to arrival.  She denies any new weakness or numbness in her extremities.  Some on and off nausea, no vomiting.  Past Medical History:  Diagnosis Date  . Anticoagulated on Coumadin   . Arthropathy of cervical facet joint    C2-3  . At risk for falls   . BPV (benign positional vertigo)   . Bruises easily   . Cancer ([place]) 2004    breast-lumpectomy,nodes ,radiation,chemo  . Chronic neck pain   . CKD (chronic kidney disease), stage II   . Diverticulosis of colon   . Elevated troponin    a. 08/2016 in setting of PAF;  b. 08/2016 low risk MV w/ fixed septal defect (LBBB), EF 53%.  Marland Kitchen GERD (gastroesophageal reflux disease)   . Hiatal hernia   . History of aortic valve stenosis   . History of breast cancer per pt no recurrence   dx 2004-- Left breast DCIS (ER/PR+, HER2 negative) s/p  partial masectomy w/ sln bx and  chemoradiotion (completed 2004)  . History of colon polyps   . History of herpes zoster   . History of kidney stones   . History of kidney stones   . HTN (hypertension)   . Hyperlipidemia   . Hypertrophy of inter-atrial  septum    a. 09/2014 Echo: ? of atrial mass; b. 10/2014 Cardiac MRI: no atrial septal mass - polypoid lipomatous hypertrophy of superior atrial septum noted-->Benign.  . Irritable bowel syndrome   . LBBB (left bundle branch block)   . PAF (paroxysmal atrial fibrillation) (HCC)    a. CHA2DS2VASc = 4-->coumadin.  . Parkinsonism [facility])    neurologist-  dr tat  . Personal history of chemotherapy 2004  . Personal history of radiation therapy 2004  . PONV (postoperative nausea and vomiting)   . PVC's (premature ventricular contractions)   . Right ureteral stone   . S/P aortic valve replacement with prosthetic valve    a. 04/13/2006 s/p St Jude mechnical AVR for severe AS;  b. 09/2014 Echo: EF 40-45%, gr1 DD, mild AI/MR, triv TR/PR.  . SUI (stress urinary incontinence, female)   . Unstable balance    uses cane  . Venous varices      Patient Active Problem List   Diagnosis Date Noted  . Elevated troponin 09/19/2016  . Emesis 09/19/2016  . UTI (urinary tract infection) 09/19/2016  . Sinus bradycardia 09/16/2016  . Family history of colon cancer 07/30/2014  . Hiatal hernia 07/30/2014  . Chest pain  10/13/2011  . Disequilibrium 10/13/2011  . Heart valve replaced by other means 04/16/2011  . Chronic anticoagulation 03/17/2011  . S/P AVR (aortic valve replacement)   . PVC's (premature ventricular contractions)   . HTN (hypertension)   . Hypercholesteremia   . Atrial fibrillation with RVR ([place])   . GERD (gastroesophageal reflux disease)   . GERD 02/06/2010  . DIVERTICULOSIS-COLON 02/06/2010  . PERSONAL HX COLONIC POLYPS 02/06/2010  . MASTECTOMY, HX OF 02/06/2010    Past Surgical History:  Procedure Laterality Date  . ANTERIOR AND POSTERIOR REPAIR  1990   cystocele/ rectocele  . AORTIC VALVE REPLACEMENT  04/13/06   dr gerhardt   and Replacement Ascending Aorta (St. Jude mechanical prosthesis)  . APPENDECTOMY  child 1950  . BREAST LUMPECTOMY Left 01/11/2003   malignant  . BREAST  SURGERY Left 2004  . CARDIAC CATHETERIZATION  02-22-2001   dr Martinique   minimal non-obstructive cad/  mild aortic stenosis and aortic root enlargement/  normal LVF, ef 65%  . CARDIAC CATHETERIZATION  03-22-2006    dr Martinique   no significant obstructive cad/  severe aortic stenosis and mild to moderate aortic root enlargement/  upper normal right heart pressures  . CARDIOVASCULAR STRESS TEST  09-30-2011   dr Martinique   lexiscan nuclear study w/ apical thinning  vs  small prior apical infarct w/ no significant ischemia/  hypokinesis of the distal septum and apex, ef 50%  . CARPAL TUNNEL RELEASE Bilateral left 09-09-2004/  right 2007  . CATARACT EXTRACTION W/ INTRAOCULAR LENS  IMPLANT, BILATERAL  2015  . COLONOSCOPY N/A 10/03/2014   Procedure: COLONOSCOPY;  Surgeon: [name], MD;  Location: [facility];  Service: Endoscopy;  Laterality: N/A;  . CYSTOSCOPY WITH RETROGRADE PYELOGRAM, URETEROSCOPY AND STENT PLACEMENT Right 09/17/2016   Procedure: CYSTOSCOPY WITH RIGHT RETROGRADE PYELOGRAM, RIGHT URETEROSCOPY AND STENT PLACEMENT LASER LITHOTRIPSY, RIGHT STENT PLACEMENT;  Surgeon: [name], MD;  Location: [facility];  Service: Urology;  Laterality: Right;  . ESOPHAGEAL MANOMETRY N/A 08/12/2014   Procedure: ESOPHAGEAL MANOMETRY (EM);  Surgeon: [name], MD;  Location: [facility] ENDOSCOPY;  Service: Endoscopy;  Laterality: N/A;  . ESOPHAGOGASTRODUODENOSCOPY N/A 10/03/2014   Procedure: ESOPHAGOGASTRODUODENOSCOPY (EGD);  Surgeon: [name], MD;  Location: [facility] ENDOSCOPY;  Service: Endoscopy;  Laterality: N/A;  . [place]  . FOOT SURGERY Right 2013   hammertoe x2  . HOLMIUM LASER APPLICATION Right 05/12/16   Procedure: HOLMIUM LASER APPLICATION;  Surgeon: [name], MD;  Location: [facility];  Service: Urology;  Laterality: Right;  . PERCUTANEOUS NEPHROSTOLITHOTOMY  1993  . STONE EXTRACTION WITH BASKET Right 09/17/2016     Procedure: STONE EXTRACTION WITH BASKET;  Surgeon: [name], MD;  Location: [facility];  Service: Urology;  Laterality: Right;  . TOTAL ABDOMINAL HYSTERECTOMY  1971   w/ Bilateral Salpingoophorectomy  . TRANSTHORACIC ECHOCARDIOGRAM  10/24/2014   dr Martinique   hypokinesis of the basal and mid inferoseptal and inferior walls,  ef 40-45%,  grade 1 diastolic dysfunction/  mechanical bileaflet aortic valve noted w/ mild regur., peak grandient 54mHg, valve area 1.35cm^2/  severe MV calcification without stenosis w/ mild regurg., peak gradient 333mg/  mild LAE/  poss. atrial mass (MRI showed benign lipomatous hypertrophy of atrial septum) /tFrazier ButtR/mild TR    OB History    No data available       Home Medications    Prior to Admission medications  Medication Sig Start Date End Date Taking? Authorizing Provider  acetaminophen (TYLENOL) 500 MG tablet Take 1,000 mg by mouth every 6 (six) hours as needed (head pain).     [provider]  calcium carbonate (TUMS - DOSED IN MG ELEMENTAL CALCIUM) 500 MG chewable tablet Chew 1 tablet by mouth daily.    [provider]  calcium citrate-vitamin D (CITRACAL+D) 315-200 MG-UNIT per tablet Take 1 tablet by mouth 3 (three) times daily.    [provider]  carbidopa-levodopa (SINEMET IR) 25-100 MG tablet TAKE 2 TABLETS BY MOUTH THREE TIMES DAILY 11/16/17   Tat, Eustace Quail, DO  Cholecalciferol (VITAMIN D3) 2000 units TABS Take 1 capsule by mouth daily.    [provider]  famotidine-calcium carbonate-magnesium hydroxide (PEPCID COMPLETE) 10-800-165 MG CHEW chewable tablet Chew 1 tablet by mouth daily as needed (heartburn).    [provider]  KLOR-CON M20 20 MEQ tablet Take 20 mEq by mouth daily. 05/26/17   [provider]  KRILL OIL PO Take by mouth.    [provider]  Melatonin 3 MG TABS Take by mouth.    [provider]  Multiple Vitamin (MULTIVITAMIN) tablet  Take 1 tablet by mouth daily at 12 noon.     [provider]  Multiple Vitamins-Minerals (VISION-VITE PRESERVE PO) Take 1 tablet by mouth 2 (two) times daily.    [provider]  pantoprazole (PROTONIX) 40 MG tablet Take 40 mg by mouth daily. 06/07/17   [provider]  simvastatin (ZOCOR) 20 MG tablet Take 20 mg by mouth every evening.    [provider]  triamterene-hydrochlorothiazide (MAXZIDE-25) 37.5-25 MG per tablet Take 1 tablet by mouth every morning.     [provider]  vitamin B-12 (CYANOCOBALAMIN) 1000 MCG tablet Take 1,000 mcg by mouth every morning.    [provider]  warfarin (COUMADIN) 5 MG tablet Take 5 mg by mouth every evening. Take 7.5 mg by mouth every Tues and Sat; take 5 mg by mouth all other days    [provider]    Family History Family History  Problem Relation Age of Onset  . Heart disease Mother   . Heart disease Father 88       CABG  . Other Brother        AVR    Social History Social History   Tobacco Use  . Smoking status: Never Smoker  . Smokeless tobacco: Never Used  Substance Use Topics  . Alcohol use: No    Alcohol/week: 0.0 oz  . Drug use: No     Allergies   Demerol [meperidine]; Oxycodone; Adhesive [tape]; Ivp dye [iodinated diagnostic agents]; and Phenergan [promethazine]   Review of Systems Review of Systems  Constitutional: Negative for fever.  Gastrointestinal: Positive for nausea. Negative for vomiting.  Musculoskeletal: Positive for neck pain.  Neurological: Positive for headaches. Negative for dizziness, weakness and numbness.  All other systems reviewed and are negative.    Physical Exam Updated Vital Signs BP (!) 117/56   Pulse 62   Temp 98.6 F (37 C) (Oral)   Resp 18   Ht 5' (1.524 m)   Wt 62.1 kg (137 lb)   SpO2 98%   BMI 26.76 kg/m   Physical Exam  Constitutional: She is oriented to person, place, and time. She appears well-developed and  well-nourished.  HENT:  Head: Normocephalic. Head is with contusion.    Right Ear: External ear normal.  Left Ear: External ear normal.  Nose: Nose normal.  Eyes: EOM are normal. Pupils are equal, round, and reactive to light. Right eye exhibits no discharge. Left eye exhibits no discharge.  Neck: Neck supple. Muscular tenderness (mild) present. No spinous process tenderness present.    Cardiovascular: Normal rate, regular rhythm and normal heart sounds.  Pulmonary/Chest: Effort normal and breath sounds normal.  Abdominal: Soft. There is no tenderness.  Musculoskeletal:       Right hip: She exhibits normal range of motion.       Left hip: She exhibits normal range of motion.  Neurological: She is alert and oriented to person, place, and time.  CN 3-12 grossly intact. 5/5 strength in all 4 extremities. Grossly normal sensation. Normal finger to nose.   Skin: Skin is warm and dry.  Nursing note and vitals reviewed.    ED Treatments / Results  Labs (all labs ordered are listed, but only abnormal results are displayed) Labs Reviewed  PROTIME-INR - Abnormal; Notable for the following components:      Result Value   Prothrombin Time 24.7 (*)    All other components within normal limits    EKG  EKG Interpretation None       Radiology Ct Head Wo Contrast  Result Date: 02/12/2018 CLINICAL DATA:  Parkinson disease. Fall with head injury. Anticoagulated. EXAM: CT HEAD WITHOUT CONTRAST CT CERVICAL SPINE WITHOUT CONTRAST TECHNIQUE: Multidetector CT imaging of the head and cervical spine was performed following the standard protocol without intravenous contrast. Multiplanar CT image reconstructions of the cervical spine were also generated. COMPARISON:  12/26/2017 head CT. 06/03/2017 head and cervical spine CT. FINDINGS: CT HEAD FINDINGS Brain: No evidence of parenchymal hemorrhage or extra-axial fluid collection. No mass lesion, mass effect, or midline shift. No CT evidence of acute  infarction. Generalized cerebral volume loss. Nonspecific mild subcortical and periventricular white matter hypodensity, most in keeping with chronic small vessel ischemic change. Cerebral volume is age appropriate. No ventriculomegaly. Vascular: No acute abnormality. Skull: No evidence of calvarial fracture. Sinuses/Orbits: The visualized paranasal sinuses are essentially clear. Other: Moderate to large medial left parietal scalp hematoma. The mastoid air cells are unopacified. CT CERVICAL SPINE FINDINGS Alignment: Reversal of the normal cervical lordosis. No facet subluxation. Dens is well positioned between the lateral masses of C1. Skull base and vertebrae: No acute fracture. No primary bone lesion or focal pathologic process. Stable congenital incomplete fusion posteriorly in the midline in the C1 arch. Soft tissues and spinal canal: No prevertebral edema. No visible canal hematoma. Disc levels: Mild-to-moderate multilevel degenerative disc disease in the cervical spine, most prominent C5-6 and C6-7. Mild-to-moderate bilateral facet arthropathy. No significant degenerative foraminal stenosis. Upper chest: No acute abnormality. Other: Visualized mastoid air cells appear clear. No discrete thyroid nodules. No pathologically enlarged cervical nodes. IMPRESSION: CT HEAD: 1. Moderate to large medial left parietal scalp hematoma. 2. No evidence of acute intracranial abnormality. No evidence of calvarial fracture. 3. Generalized cerebral volume loss and mild chronic small vessel ischemic changes in the cerebral white matter. CT CERVICAL SPINE: 1. No cervical spine fracture or subluxation. 2. Reversal of the normal cervical lordosis, usually due to positioning and/or muscle spasm. 3. Mild-to-moderate degenerative changes in the cervical spine as detailed. Electronically Signed   By: Ilona Sorrel M.D.   On: 02/12/2018 18:18   Ct Cervical Spine Wo Contrast  Result Date: 02/12/2018 CLINICAL DATA:  Parkinson disease.  Fall with head injury. Anticoagulated. EXAM: CT HEAD WITHOUT CONTRAST CT CERVICAL SPINE WITHOUT CONTRAST TECHNIQUE: Multidetector CT  imaging of the head and cervical spine was performed following the standard protocol without intravenous contrast. Multiplanar CT image reconstructions of the cervical spine were also generated. COMPARISON:  12/26/2017 head CT. 06/03/2017 head and cervical spine CT. FINDINGS: CT HEAD FINDINGS Brain: No evidence of parenchymal hemorrhage or extra-axial fluid collection. No mass lesion, mass effect, or midline shift. No CT evidence of acute infarction. Generalized cerebral volume loss. Nonspecific mild subcortical and periventricular white matter hypodensity, most in keeping with chronic small vessel ischemic change. Cerebral volume is age appropriate. No ventriculomegaly. Vascular: No acute abnormality. Skull: No evidence of calvarial fracture. Sinuses/Orbits: The visualized paranasal sinuses are essentially clear. Other: Moderate to large medial left parietal scalp hematoma. The mastoid air cells are unopacified. CT CERVICAL SPINE FINDINGS Alignment: Reversal of the normal cervical lordosis. No facet subluxation. Dens is well positioned between the lateral masses of C1. Skull base and vertebrae: No acute fracture. No primary bone lesion or focal pathologic process. Stable congenital incomplete fusion posteriorly in the midline in the C1 arch. Soft tissues and spinal canal: No prevertebral edema. No visible canal hematoma. Disc levels: Mild-to-moderate multilevel degenerative disc disease in the cervical spine, most prominent C5-6 and C6-7. Mild-to-moderate bilateral facet arthropathy. No significant degenerative foraminal stenosis. Upper chest: No acute abnormality. Other: Visualized mastoid air cells appear clear. No discrete thyroid nodules. No pathologically enlarged cervical nodes. IMPRESSION: CT HEAD: 1. Moderate to large medial left parietal scalp hematoma. 2. No evidence of  acute intracranial abnormality. No evidence of calvarial fracture. 3. Generalized cerebral volume loss and mild chronic small vessel ischemic changes in the cerebral white matter. CT CERVICAL SPINE: 1. No cervical spine fracture or subluxation. 2. Reversal of the normal cervical lordosis, usually due to positioning and/or muscle spasm. 3. Mild-to-moderate degenerative changes in the cervical spine as detailed. Electronically Signed   By: Ilona Sorrel M.D.   On: 02/12/2018 18:18    Procedures Procedures (including critical care time)  Medications Ordered in ED Medications - No data to display   Initial Impression / Assessment and Plan / ED Course  I have reviewed the triage vital signs and the nursing notes.  Pertinent labs & imaging results that were available during my care of the patient were reviewed by me and considered in my medical decision making (see chart for details).     CT scans are unremarkable.  She has no significant complaints and her headache seems to be improving.  She does not have any focal neurologic deficits or concerns.  With no intracranial bleeding or skull fracture, she appears stable for discharge home.  Her neck CT is unremarkable.  INR is slightly low and she was instructed to call her PCP for further instructions on adjusting her INR.  She states it was a little lower than that last week with no adjustments made.  Discussed possibility of delayed head bleed and warning signs to watch for.  Discussed return precautions.  Final Clinical Impressions(s) / ED Diagnoses   Final diagnoses:  Fall, initial encounter  Hematoma of scalp, initial encounter    ED Discharge Orders    None       Sherwood Gambler, MD 02/12/18 [phone]

## 2018-02-12 NOTE — ED Notes (Signed)
ED Provider at bedside. 

## 2018-02-12 NOTE — ED Notes (Signed)
Pt has hx of Parkinson's and was removing her pocketbook over her head. Bent down to do this, stood back up and "went backward". Was using walker at the time, but let go to remove her purse. Hit head on wall/door frame and wooden floor. No LOC. Hematoma to back of head. Ice being applied. Pt is on blood thinners. Alert. PERRL.

## 2018-02-12 NOTE — ED Triage Notes (Signed)
Patient has mobility issues and today she fell and hit the back of her head. The patient is on coumadin

## 2018-02-12 NOTE — Discharge Instructions (Signed)
Your INR is a little bit low at 2.25.  Call your doctor tomorrow, 02/13/18 for further instructions on adjustment of your warfarin dosing.  If you develop a new or worsening headache, vomiting, weakness/numbness in your extremities, blurry vision, or any other new or concerning symptoms, you may have delayed bleeding in and/or around your brain and you need to come back immediately.`

## 2018-02-21 DIAGNOSIS — Z7901 Long term (current) use of anticoagulants: Secondary | ICD-10-CM | POA: Diagnosis not present

## 2018-02-21 DIAGNOSIS — I358 Other nonrheumatic aortic valve disorders: Secondary | ICD-10-CM | POA: Diagnosis not present

## 2018-03-07 DIAGNOSIS — Z6827 Body mass index (BMI) 27.0-27.9, adult: Secondary | ICD-10-CM | POA: Diagnosis not present

## 2018-03-07 DIAGNOSIS — I10 Essential (primary) hypertension: Secondary | ICD-10-CM | POA: Diagnosis not present

## 2018-03-07 DIAGNOSIS — H8110 Benign paroxysmal vertigo, unspecified ear: Secondary | ICD-10-CM | POA: Diagnosis not present

## 2018-03-07 DIAGNOSIS — G231 Progressive supranuclear ophthalmoplegia [Steele-Richardson-Olszewski]: Secondary | ICD-10-CM | POA: Diagnosis not present

## 2018-03-07 DIAGNOSIS — Z7901 Long term (current) use of anticoagulants: Secondary | ICD-10-CM | POA: Diagnosis not present

## 2018-03-23 DIAGNOSIS — Z7901 Long term (current) use of anticoagulants: Secondary | ICD-10-CM | POA: Diagnosis not present

## 2018-03-23 DIAGNOSIS — I358 Other nonrheumatic aortic valve disorders: Secondary | ICD-10-CM | POA: Diagnosis not present

## 2018-03-27 NOTE — Progress Notes (Signed)
Office Visit    Patient Name: [name] Date of Encounter: 03/28/2018  Primary Care Provider:  Leanna Battles, MD Primary Cardiologist:  P. Martinique, MD   Chief Complaint    77 y/o ? with a h/o severe AS s/p SJM AVR and PAF seen for follow up.  Past Medical History    Past Medical History:  Diagnosis Date  . Anticoagulated on Coumadin   . Arthropathy of cervical facet joint    C2-3  . At risk for falls   . BPV (benign positional vertigo)   . Bruises easily   . Cancer ([place]) 2004    breast-lumpectomy,nodes ,radiation,chemo  . Chronic neck pain   . CKD (chronic kidney disease), stage II   . Diverticulosis of colon   . Elevated troponin    a. 08/2016 in setting of PAF;  b. 08/2016 low risk MV w/ fixed septal defect (LBBB), EF 53%.  Marland Kitchen GERD (gastroesophageal reflux disease)   . Hiatal hernia   . History of aortic valve stenosis   . History of breast cancer per pt no recurrence   dx 2004-- Left breast DCIS (ER/PR+, HER2 negative) s/p  partial masectomy w/ sln bx and  chemoradiotion (completed 2004)  . History of colon polyps   . History of herpes zoster   . History of kidney stones   . History of kidney stones   . HTN (hypertension)   . Hyperlipidemia   . Hypertrophy of inter-atrial septum    a. 09/2014 Echo: ? of atrial mass; b. 10/2014 Cardiac MRI: no atrial septal mass - polypoid lipomatous hypertrophy of superior atrial septum noted-->Benign.  . Irritable bowel syndrome   . LBBB (left bundle branch block)   . PAF (paroxysmal atrial fibrillation) (HCC)    a. CHA2DS2VASc = 4-->coumadin.  . Parkinsonism [facility])    neurologist-  dr tat  . Personal history of chemotherapy 2004  . Personal history of radiation therapy 2004  . PONV (postoperative nausea and vomiting)   . PVC's (premature ventricular contractions)   . Right ureteral stone   . S/P aortic valve replacement with prosthetic valve    a. 04/13/2006 s/p St Jude mechnical AVR for severe AS;  b. 09/2014  Echo: EF 40-45%, gr1 DD, mild AI/MR, triv TR/PR.  . SUI (stress urinary incontinence, female)   . Unstable balance    uses cane  . Venous varices     Past Surgical History:  Procedure Laterality Date  . ANTERIOR AND POSTERIOR REPAIR  1990   cystocele/ rectocele  . AORTIC VALVE REPLACEMENT  04/13/06   dr gerhardt   and Replacement Ascending Aorta (St. Jude mechanical prosthesis)  . APPENDECTOMY  child 1950  . BREAST LUMPECTOMY Left 01/11/2003   malignant  . BREAST SURGERY Left 2004  . CARDIAC CATHETERIZATION  02-22-2001   dr Martinique   minimal non-obstructive cad/  mild aortic stenosis and aortic root enlargement/  normal LVF, ef 65%  . CARDIAC CATHETERIZATION  03-22-2006    dr Martinique   no significant obstructive cad/  severe aortic stenosis and mild to moderate aortic root enlargement/  upper normal right heart pressures  . CARDIOVASCULAR STRESS TEST  09-30-2011   dr Martinique   lexiscan nuclear study w/ apical thinning  vs  small prior apical infarct w/ no significant ischemia/  hypokinesis of the distal septum and apex, ef 50%  . CARPAL TUNNEL RELEASE Bilateral left 09-09-2004/  right 2007  . CATARACT EXTRACTION W/ INTRAOCULAR LENS  IMPLANT,  BILATERAL  2015  . COLONOSCOPY N/A 10/03/2014   Procedure: COLONOSCOPY;  Surgeon: [name], MD;  Location: [place];  Service: Endoscopy;  Laterality: N/A;  . CYSTOSCOPY WITH RETROGRADE PYELOGRAM, URETEROSCOPY AND STENT PLACEMENT Right 09/17/2016   Procedure: CYSTOSCOPY WITH RIGHT RETROGRADE PYELOGRAM, RIGHT URETEROSCOPY AND STENT PLACEMENT LASER LITHOTRIPSY, RIGHT STENT PLACEMENT;  Surgeon: [name], MD;  Location: [facility];  Service: Urology;  Laterality: Right;  . ESOPHAGEAL MANOMETRY N/A 08/12/2014   Procedure: ESOPHAGEAL MANOMETRY (EM);  Surgeon: [name], MD;  Location: [facility];  Service: Endoscopy;  Laterality: N/A;  . ESOPHAGOGASTRODUODENOSCOPY N/A 10/03/2014   Procedure: ESOPHAGOGASTRODUODENOSCOPY  (EGD);  Surgeon: [name], MD;  Location: [facility] ENDOSCOPY;  Service: Endoscopy;  Laterality: N/A;  . [place]  . FOOT SURGERY Right 2013   hammertoe x2  . HOLMIUM LASER APPLICATION Right 7/41/2878   Procedure: HOLMIUM LASER APPLICATION;  Surgeon: [name], MD;  Location: [facility];  Service: Urology;  Laterality: Right;  . PERCUTANEOUS NEPHROSTOLITHOTOMY  1993  . STONE EXTRACTION WITH BASKET Right 09/17/2016   Procedure: STONE EXTRACTION WITH BASKET;  Surgeon: [name], MD;  Location: [facility];  Service: Urology;  Laterality: Right;  . TOTAL ABDOMINAL HYSTERECTOMY  1971   w/ Bilateral Salpingoophorectomy  . TRANSTHORACIC ECHOCARDIOGRAM  10/24/2014   dr Martinique   hypokinesis of the basal and mid inferoseptal and inferior walls,  ef 40-45%,  grade 1 diastolic dysfunction/  mechanical bileaflet aortic valve noted w/ mild regur., peak grandient 28mHg, valve area 1.35cm^2/  severe MV calcification without stenosis w/ mild regurg., peak gradient 358mg/  mild LAE/  poss. atrial mass (MRI showed benign lipomatous hypertrophy of atrial septum) /tFrazier ButtR/mild TR    Allergies  Allergies  Allergen Reactions  . Demerol [Meperidine] Shortness Of Breath and Swelling  . Oxycodone Other (See Comments)    Hallucinations   . Adhesive [Tape] Other (See Comments)    Redness and skin tears  . Ivp Dye [Iodinated Diagnostic Agents] Hives    OK with benadryl pre-med (5052mne hour before receiving iodinated contrast agent)  . Phenergan [Promethazine] Other (See Comments)    Avoids due to reaction with current medications    History of Present Illness    [age]o ? with a h/o severe AS s/p SJM mech AVR in 2007. She also has a h/o PAF and is chronically anticoagulated with coumadin.  She has a history of bradycardia on beta blocker and this was DC'd.   September 2017 following a urologic procedure  she developed abd  pain, nausea, c/p, sob, and palpitations, prompting her to call EMS.  She was found to be in AF with RVR, which later converted to sinus rhythm.  [facility].  She  underwent stress testing, which was low risk.    She was seen in October by Dr KleCaryl Comeso felt her Afib episodes were infrequent and recommended following for now. If episodes became more frequent then consider resumption of beta blocker or AAD without rate slowing effects such as Tikosyn.   She does have a history of atypical Parikinsons and progressive supranuclear palsy followed by Neurology. She had falls in Dec. And Feb.  and seen in ED.   On follow up today she reports she has had diarrhea for the past week. Some stomach cramps. Has taken Imodium without relief. No fever or chills.  No blood in stool. feels it may be better today. Denies any palpitations or tachycardia.  No dizziness, chest pain, dyspnea.  INR was 3.5 one week ago.   Current Meds  Medication Sig  . acetaminophen (TYLENOL) 500 MG tablet Take 1,000 mg by mouth every 6 (six) hours as needed (head pain).   . calcium carbonate (TUMS - DOSED IN MG ELEMENTAL CALCIUM) 500 MG chewable tablet Chew 1 tablet by mouth daily.  . calcium citrate-vitamin D (CITRACAL+D) 315-200 MG-UNIT per tablet Take 1 tablet by mouth 3 (three) times daily.  . carbidopa-levodopa (SINEMET IR) 25-100 MG tablet TAKE 2 TABLETS BY MOUTH THREE TIMES DAILY  . Cholecalciferol (VITAMIN D3) 2000 units TABS Take 1 capsule by mouth daily.  . famotidine-calcium carbonate-magnesium hydroxide (PEPCID COMPLETE) 10-800-165 MG CHEW chewable tablet Chew 1 tablet by mouth daily as needed (heartburn).  Marland Kitchen KLOR-CON M20 20 MEQ tablet Take 20 mEq by mouth daily.  Marland Kitchen KRILL OIL PO Take by mouth.  . Melatonin 3 MG TABS Take by mouth.  . Multiple Vitamin (MULTIVITAMIN) tablet Take 1 tablet by mouth daily at 12 noon.   . Multiple Vitamins-Minerals (VISION-VITE PRESERVE PO) Take 1  tablet by mouth 2 (two) times daily.  . pantoprazole (PROTONIX) 40 MG tablet Take 40 mg by mouth daily.  . simvastatin (ZOCOR) 20 MG tablet Take 20 mg by mouth every evening.  . triamterene-hydrochlorothiazide (MAXZIDE-25) 37.5-25 MG per tablet Take 1 tablet by mouth every morning.   . vitamin B-12 (CYANOCOBALAMIN) 1000 MCG tablet Take 1,000 mcg by mouth every morning.  . warfarin (COUMADIN) 5 MG tablet Take 5 mg by mouth every evening. Take 7.5 mg by mouth every Tues and Sat; take 5 mg by mouth all other days      Review of Systems    As noted in HPI.  All other systems reviewed and are otherwise negative except as noted above.  Physical Exam    VS:  BP 136/78   Pulse 73   Ht 4' 9"  (1.448 m)   Wt 138 lb 12.8 oz (63 kg)   BMI 30.04 kg/m  , BMI Body mass index is 30.04 kg/m. GENERAL:  Well appearing, WF in NAD walks with a cane. HEENT:  PERRL, EOMI, sclera are clear. Oropharynx is clear. NECK:  No jugular venous distention, carotid upstroke brisk and symmetric, no bruits, no thyromegaly or adenopathy LUNGS:  Clear to auscultation bilaterally CHEST:  Unremarkable HEART:  RRR,  PMI not displaced or sustained, good mechanical AV click with gr 2/6 SEM RUSB, no S3, no S4: no clicks, no rubs ABD:  Soft, nontender. BS +, no masses or bruits. No hepatomegaly, no splenomegaly EXT:  2 + pulses throughout, no edema, no cyanosis no clubbing. Superficial abrasion left shin SKIN:  Warm and dry.  No rashes NEURO:  Alert and oriented x 3. Cranial nerves II through XII intact. PSYCH:  Cognitively intact      Accessory Clinical Findings    Lab Results  Component Value Date   WBC 4.7 09/21/2016   HGB 9.4 (L) 09/21/2016   HCT 28.9 (L) 09/21/2016   PLT 161 09/21/2016   GLUCOSE 124 (H) 09/21/2016   CHOL 145 09/30/2011   TRIG 42 09/30/2011   HDL 77 09/30/2011   LDLCALC 60 09/30/2011   ALT 7 (L) 09/19/2016   AST 36 09/19/2016   NA 135 09/21/2016   K 3.6 09/21/2016   CL 103 09/21/2016     CREATININE 1.58 (H) 09/21/2016  BUN 17 09/21/2016   CO2 25 09/21/2016   INR 2.25 02/12/2018   Labs dated 05/10/16: cholesterol 171, triglycerides 81, HDL 58, LDL 97. Dated 05/19/17: cholesterol 166, triglycerides 73, HDL 63, LDL 88. Creatinine 1.4, Hgb 12.1. ALT normal. Dated 03/07/18: Hgb 12.6, creatinine 1.2  Assessment & Plan    1.  PAF/Tachy-brady syndrome:  Pt has a h/o PAF. No recurrence. History of severe brady on beta blockers.  She has no symptoms of tachycardia.   Will continue to monitor off beta blocker. I will follow up in 6 months.  2.  S/P SJM AVR:  Stable on echo in 2015.  Chronic coumadin. Normal valve click on exam.  3.  Essential HTN:  Controlled  4.  HL:  Fair control. Cont statin.  5. Diarrhea. Exam benign. Eating bland diet. If symptoms do not improve instructed to call primary care.   Daneen Volcy Martinique, MD, [facility] 03/28/2018, 9:43 AM

## 2018-03-28 ENCOUNTER — Ambulatory Visit (INDEPENDENT_AMBULATORY_CARE_PROVIDER_SITE_OTHER): Payer: PPO | Admitting: Cardiology

## 2018-03-28 ENCOUNTER — Encounter: Payer: Self-pay | Admitting: Cardiology

## 2018-03-28 VITALS — BP 136/78 | HR 73 | Ht <= 58 in | Wt 138.8 lb

## 2018-03-28 DIAGNOSIS — Z952 Presence of prosthetic heart valve: Secondary | ICD-10-CM

## 2018-03-28 DIAGNOSIS — E7800 Pure hypercholesterolemia, unspecified: Secondary | ICD-10-CM

## 2018-03-28 DIAGNOSIS — I480 Paroxysmal atrial fibrillation: Secondary | ICD-10-CM | POA: Diagnosis not present

## 2018-03-28 DIAGNOSIS — I10 Essential (primary) hypertension: Secondary | ICD-10-CM | POA: Diagnosis not present

## 2018-03-28 NOTE — Patient Instructions (Signed)
Continue your current therapy  I will see you in 6 months.   

## 2018-04-06 ENCOUNTER — Ambulatory Visit: Payer: PPO | Admitting: Occupational Therapy

## 2018-04-06 ENCOUNTER — Ambulatory Visit: Payer: PPO | Attending: Internal Medicine

## 2018-04-06 ENCOUNTER — Ambulatory Visit: Payer: PPO | Admitting: Physical Therapy

## 2018-04-06 DIAGNOSIS — R29818 Other symptoms and signs involving the nervous system: Secondary | ICD-10-CM | POA: Insufficient documentation

## 2018-04-06 DIAGNOSIS — R278 Other lack of coordination: Secondary | ICD-10-CM

## 2018-04-06 DIAGNOSIS — R471 Dysarthria and anarthria: Secondary | ICD-10-CM | POA: Insufficient documentation

## 2018-04-06 NOTE — Therapy (Signed)
[facility] [address] [place], [place], [address] Phone: [phone]   Fax:  [phone]  Patient Details  Name: [name] MRN: [id number] Date of Birth: 1941-11-21 Referring Provider:  Leanna Battles, MD  Encounter Date: 04/06/2018 Occupational Therapy Parkinson's Disease Screen  Hand dominance:  right     Physical Performance Test item #4 (donning/doffing jacket):  8.0 sec  Fastening/unfastening 3 buttons in:  33.93sec  9-hole peg test:    RUE  24.78 sec        LUE  26.79 sec  Change in ability to perform ADLs/IADLs:  No, handwriting is small and not fully legible, pt and husband report this is not a change.    Pt does not require occupational therapy services at this time.  Recommended occupational therapy screen in   6 mons  Sohrab Keelan 04/06/2018, 9:06 AM  [facility] [address] [place], [place], [address] Phone: [phone]   Fax:  [phone]

## 2018-04-06 NOTE — Therapy (Signed)
[facility] [address] [place], [place], [address] Phone: [phone]   Fax:  [phone]  Physical Therapy Treatment  Patient Details  Name: [name] MRN: [id number] Date of Birth: 27-Aug-1941 No data recorded  Encounter Date: 04/06/2018   Physical Therapy Parkinson's Disease Screen   Timed Up and Go test:13.94 sec  10 meter walk test:12.41 sec,   5 time sit to stand test:9.66 sec    Patient does not require Physical Therapy services at this time.  Recommend Physical Therapy screen in 6 months     Past Medical History:  Diagnosis Date  . Anticoagulated on Coumadin   . Arthropathy of cervical facet joint    C2-3  . At risk for falls   . BPV (benign positional vertigo)   . Bruises easily   . Cancer ([place]) 2004    breast-lumpectomy,nodes ,radiation,chemo  . Chronic neck pain   . CKD (chronic kidney disease), stage II   . Diverticulosis of colon   . Elevated troponin    a. 08/2016 in setting of PAF;  b. 08/2016 low risk MV w/ fixed septal defect (LBBB), EF 53%.  Marland Kitchen GERD (gastroesophageal reflux disease)   . Hiatal hernia   . History of aortic valve stenosis   . History of breast cancer per pt no recurrence   dx 2004-- Left breast DCIS (ER/PR+, HER2 negative) s/p  partial masectomy w/ sln bx and  chemoradiotion (completed 2004)  . History of colon polyps   . History of herpes zoster   . History of kidney stones   . History of kidney stones   . HTN (hypertension)   . Hyperlipidemia   . Hypertrophy of inter-atrial septum    a. 09/2014 Echo: ? of atrial mass; b. 10/2014 Cardiac MRI: no atrial septal mass - polypoid lipomatous hypertrophy of superior atrial septum noted-->Benign.  . Irritable bowel syndrome   . LBBB (left bundle branch block)   . PAF (paroxysmal atrial fibrillation) (HCC)    a. CHA2DS2VASc = 4-->coumadin.  . Parkinsonism [facility])    neurologist-  dr tat  . Personal history of  chemotherapy 2004  . Personal history of radiation therapy 2004  . PONV (postoperative nausea and vomiting)   . PVC's (premature ventricular contractions)   . Right ureteral stone   . S/P aortic valve replacement with prosthetic valve    a. 04/13/2006 s/p St Jude mechnical AVR for severe AS;  b. 09/2014 Echo: EF 40-45%, gr1 DD, mild AI/MR, triv TR/PR.  . SUI (stress urinary incontinence, female)   . Unstable balance    uses cane  . Venous varices      Past Surgical History:  Procedure Laterality Date  . ANTERIOR AND POSTERIOR REPAIR  1990   cystocele/ rectocele  . AORTIC VALVE REPLACEMENT  04/13/06   dr gerhardt   and Replacement Ascending Aorta (St. Jude mechanical prosthesis)  . APPENDECTOMY  child 1950  . BREAST LUMPECTOMY Left 01/11/2003   malignant  . BREAST SURGERY Left 2004  . CARDIAC CATHETERIZATION  02-22-2001   dr Martinique   minimal non-obstructive cad/  mild aortic stenosis and aortic root enlargement/  normal LVF, ef 65%  . CARDIAC CATHETERIZATION  03-22-2006    dr Martinique   no significant obstructive cad/  severe aortic stenosis and mild to moderate aortic root enlargement/  upper normal right heart pressures  . CARDIOVASCULAR STRESS TEST  09-30-2011   dr Martinique   lexiscan nuclear study w/ apical thinning  vs  small prior apical infarct w/ no significant ischemia/  hypokinesis of the distal septum and apex, ef 50%  . CARPAL TUNNEL RELEASE Bilateral left 09-09-2004/  right 2007  . CATARACT EXTRACTION W/ INTRAOCULAR LENS  IMPLANT, BILATERAL  2015  . COLONOSCOPY N/A 10/03/2014   Procedure: COLONOSCOPY;  Surgeon: [name], MD;  Location: [place];  Service: Endoscopy;  Laterality: N/A;  . CYSTOSCOPY WITH RETROGRADE PYELOGRAM, URETEROSCOPY AND STENT PLACEMENT Right 09/17/2016   Procedure: CYSTOSCOPY WITH RIGHT RETROGRADE PYELOGRAM, RIGHT URETEROSCOPY AND STENT PLACEMENT LASER LITHOTRIPSY, RIGHT STENT PLACEMENT;  Surgeon: [name], MD;  [facility]  Service: Urology;  Laterality: Right;  . ESOPHAGEAL MANOMETRY N/A 08/12/2014   Procedure: ESOPHAGEAL MANOMETRY (EM);  Surgeon: [name], MD;  [facility];  Service: Endoscopy;  Laterality: N/A;  . ESOPHAGOGASTRODUODENOSCOPY N/A 10/03/2014   Procedure: ESOPHAGOGASTRODUODENOSCOPY (EGD);  Surgeon: [name], MD;  Location: [facility] ENDOSCOPY;  Service: Endoscopy;  Laterality: N/A;  . [place]  . FOOT SURGERY Right 2013   hammertoe x2  . HOLMIUM LASER APPLICATION Right 5/85/2778   Procedure: HOLMIUM LASER APPLICATION;  Surgeon: [name], MD;  Location: [facility];  Service: Urology;  Laterality: Right;  . PERCUTANEOUS NEPHROSTOLITHOTOMY  1993  . STONE EXTRACTION WITH BASKET Right 09/17/2016   Procedure: STONE EXTRACTION WITH BASKET;  Surgeon: [name], MD;  Location[facility];  Service: Urology;  Laterality: Right;  . TOTAL ABDOMINAL HYSTERECTOMY  1971   w/ Bilateral Salpingoophorectomy  . TRANSTHORACIC ECHOCARDIOGRAM  10/24/2014   dr Martinique   hypokinesis of the basal and mid inferoseptal and inferior walls,  ef 40-45%,  grade 1 diastolic dysfunction/  mechanical bileaflet aortic valve noted w/ mild regur., peak grandient 12mHg, valve area 1.35cm^2/  severe MV calcification without stenosis w/ mild regurg., peak gradient 378mg/  mild LAE/  poss. atrial mass (MRI showed benign lipomatous hypertrophy of atrial septum) /tFrazier ButtR/mild TR    There were no vitals filed for this visit.                                          Patient will benefit from skilled therapeutic intervention in order to improve the following deficits and impairments:     Visit Diagnosis: No diagnosis found.     Problem List Patient Active Problem List   Diagnosis Date Noted  . Elevated troponin 09/19/2016  . Emesis 09/19/2016  . UTI (urinary tract infection)  09/19/2016  . Sinus bradycardia 09/16/2016  . Family history of colon cancer 07/30/2014  . Hiatal hernia 07/30/2014  . Chest pain 10/13/2011  . Disequilibrium 10/13/2011  . Heart valve replaced by other means 04/16/2011  . Chronic anticoagulation 03/17/2011  . S/P AVR (aortic valve replacement)   . PVC's (premature ventricular contractions)   . HTN (hypertension)   . Hypercholesteremia   . [place]  . GERD (gastroesophageal reflux disease)   . GERD 02/06/2010  . DIVERTICULOSIS-COLON 02/06/2010  . PERSONAL HX COLONIC POLYPS 02/06/2010  . MASTECTOMY, HX OF 02/06/2010    LyRexanne ManoPT 04/06/2018, 8:18 AM  [id number]: [phone] Fax:  [phone]: PaEda MagnussenRN: [id number] of Birth: 3/October 07, 1941

## 2018-04-06 NOTE — Therapy (Signed)
[facility] [address] [place], [place], [address] Phone: [phone]   Fax:  [phone]  Patient Details  Name: [name] MRN: [id number] Date of Birth: 1941-03-05 Referring Provider:  Alonza Bogus, DO  Speech Therapy Parkinson's Disease Screen   Decibel Level today: 70dB  (WNL=70-72 dB) with sound level meter 30cm away from pt's mouth. Pt's conversational volume has essentially remained the same since last screening.   Pt has had coughing with meals approx once/week - SLP educated pt on reasoning for coughing/throat clearing during meals. Told pt/husband should pt contact Dr. Carles Collet and inform her of this.  Pt does does not require speech therapy services at this time. Recommend ST screen in another 4-6 months.    Encounter Date: 04/06/2018   [facility] ,[place], [place]  04/06/2018, 8:08 AM  [facility] [address] [place], [place], [address] Phone: [phone]   Fax:  [phone]

## 2018-04-20 DIAGNOSIS — I358 Other nonrheumatic aortic valve disorders: Secondary | ICD-10-CM | POA: Diagnosis not present

## 2018-04-20 DIAGNOSIS — Z7901 Long term (current) use of anticoagulants: Secondary | ICD-10-CM | POA: Diagnosis not present

## 2018-04-23 ENCOUNTER — Other Ambulatory Visit: Payer: Self-pay | Admitting: Neurology

## 2018-05-18 DIAGNOSIS — Z7901 Long term (current) use of anticoagulants: Secondary | ICD-10-CM | POA: Diagnosis not present

## 2018-05-18 DIAGNOSIS — I358 Other nonrheumatic aortic valve disorders: Secondary | ICD-10-CM | POA: Diagnosis not present

## 2018-05-29 ENCOUNTER — Other Ambulatory Visit: Payer: Self-pay | Admitting: Internal Medicine

## 2018-05-29 DIAGNOSIS — Z1231 Encounter for screening mammogram for malignant neoplasm of breast: Secondary | ICD-10-CM

## 2018-05-30 DIAGNOSIS — M810 Age-related osteoporosis without current pathological fracture: Secondary | ICD-10-CM | POA: Diagnosis not present

## 2018-05-30 DIAGNOSIS — R82998 Other abnormal findings in urine: Secondary | ICD-10-CM | POA: Diagnosis not present

## 2018-05-30 DIAGNOSIS — I10 Essential (primary) hypertension: Secondary | ICD-10-CM | POA: Diagnosis not present

## 2018-06-06 DIAGNOSIS — Z1389 Encounter for screening for other disorder: Secondary | ICD-10-CM | POA: Diagnosis not present

## 2018-06-06 DIAGNOSIS — I358 Other nonrheumatic aortic valve disorders: Secondary | ICD-10-CM | POA: Diagnosis not present

## 2018-06-06 DIAGNOSIS — Z952 Presence of prosthetic heart valve: Secondary | ICD-10-CM | POA: Diagnosis not present

## 2018-06-06 DIAGNOSIS — R2689 Other abnormalities of gait and mobility: Secondary | ICD-10-CM | POA: Diagnosis not present

## 2018-06-06 DIAGNOSIS — G231 Progressive supranuclear ophthalmoplegia [Steele-Richardson-Olszewski]: Secondary | ICD-10-CM | POA: Diagnosis not present

## 2018-06-06 DIAGNOSIS — Z7901 Long term (current) use of anticoagulants: Secondary | ICD-10-CM | POA: Diagnosis not present

## 2018-06-06 DIAGNOSIS — Z0000 Encounter for general adult medical examination without abnormal findings: Secondary | ICD-10-CM | POA: Diagnosis not present

## 2018-06-06 DIAGNOSIS — M810 Age-related osteoporosis without current pathological fracture: Secondary | ICD-10-CM | POA: Diagnosis not present

## 2018-06-06 DIAGNOSIS — F5104 Psychophysiologic insomnia: Secondary | ICD-10-CM | POA: Diagnosis not present

## 2018-06-06 DIAGNOSIS — R1319 Other dysphagia: Secondary | ICD-10-CM | POA: Diagnosis not present

## 2018-06-06 DIAGNOSIS — Z6827 Body mass index (BMI) 27.0-27.9, adult: Secondary | ICD-10-CM | POA: Diagnosis not present

## 2018-06-06 DIAGNOSIS — I10 Essential (primary) hypertension: Secondary | ICD-10-CM | POA: Diagnosis not present

## 2018-06-13 NOTE — Progress Notes (Signed)
[name] was seen today in the movement disorders clinic for neurologic consultation at the request of Dr Tomi Likens for a 2nd opinion regarding gait instability.  She is accompanied by her husband who supplements the hx.  The records that were made available to me were reviewed.  Pt reports that she has been having falls, the first of which was several years ago but then she began to have more regular falls 6-8 months ago.  Most falls are when she turns and she notes that the feet will shuffle and the "body will turn and the feet will stay."  Her husband states that her feet will stay like "they are stuck on bumble gum."  No falls in last 2 weeks.  She has been in rehab recently.    07/06/16 update:  The patient is following up today regarding probable PSP.  Went to the atypical symposium I had recommended and they thought that it was very informative.  She had a modified barium swallow on 05/20/2016 that recommended a regular diet with thin liquids.  She has been participating with physical and occupational therapy since last visit.  I started her on levodopa (6am/12noon/5pm) and she has not noticed a great difference with that. No SE.  Has had 2 falls.  With one she tripped over her cane and with one she fell changing her sheets.   This was very low dose, and a benefit was not expected at this low dose.  She did see Dr. Rexene Alberts in 06/22/2016 for sleep apnea.  A sleep study was recommended.  Pt states that she hasn't scheduled that yet.  States that she had an injection in neck for pain 2 weeks ago and thinks that it has helped some.  States that she is contemplating cervical spine surgery.  She did have an MRI of the cervical spine on 06/03/2016 that demonstrated severe C2-C3 left facet arthropathy with joint effusion and bone marrow edema.  11/23/16 update:  Pt f/u today.  Last visit, I increased her carbidopa/levodopa 25/100 to 2 po tid.  She states that she noted no great difference with that.   Multiple falls over the last few weeks (5) for no reason.  One time she tripped over a threshold.  One time she fell in a bathroom.  One time she was working with the christmas tree.  All the falls except one were forward.   Unfortunately, the PSP study that was supposed to be done local had a site change and the closest site was [place], [place].  They also were told that she wasn't a good candidate because of the coumadin.  Had 2 kidney stones since last visit.  On oxybutinin.  Seeing urology this afternoon.  04/07/17 update:  Patient follows up today.  She is on carbidopa/levodopa 25/100, 2 tablets 3 times per day.  She has had 2   falls since our last visit.  She was in the emergency room on 03/19/2017 after she fell backward and hit her head.  She was stepping backwards to put trash in the trashcan.  She had the walker but was going backward.  I reviewed the emergency room notes.  I also reviewed her CT of the brain which was done that day.  It was unremarkable and demonstrated chronic white matter disease.  The other fall was in the bathroom and she was turning around to look at her hair.  She hit her bottom.  She has attended speech, occupational and physical therapy  since our last visit and those notes were reviewed.  She has had 1 day of lightheadedness but no near syncope.  She states that her mood has been good.  She is sleeping well.  She has had some intermittent diplopia.  08/11/17 update:  Patient seen in follow-up today for her atypical parkinsonism.  This patient is accompanied in the office by her spouse who supplements the history.  Patient is on carbidopa/levodopa 25/100, 2 tablets 3 times per day.  She has had one fall since last visit.  I have reviewed records made available to me.  She was in the emergency room on June 8 with a fall.  The fall actually happened a week and a half prior to her presentation to the emergency room.  She was trying to back up and fell over the walker.  She had a  CT of the brain and cervical spine.  With the exception of a posterior scalp hematoma, it was otherwise unremarkable.   She was given a RX for metoclopramide and husband asks me about why that is.  They didn't get it filled because they read it shouldn't be taken if patient has parkinsonism.  Husband states she had no nausea.  Using walker and no other falls besides one described.  They changed to a lower bed because was having trouble getting up on other one.  She continues to have some intermittent diplopia.  Since our last visit, the patient did see her urologist and she states that she had a kidney stone removed.  01/12/18 update: Patient is seen today in follow-up for atypical parkinsonism.  She is accompanied by her husband who supplements history.  She is on carbidopa/levodopa 25/100, 2 tablets 3 times per day.  I have reviewed records made available to me since last visit.  She was in the emergency room on December 26, 2017 after a fall.  She was trying to get something out of the refrigerator and tripped over her walker and fell and hit her head.  CT of the brain was completed and was nonacute.  No other falls besides yesterday.  No issues with swallowing.  No diplopia.  She is doing the bike for exercise but only 5 min at a time, twice per day.  Her cardiology notes from Dr. Martinique were reviewed as well.  06/15/18 update: Patient is seen today in follow-up for atypical parkinsonism.  She is accompanied by her husband who supplements history.  Patient is on carbidopa/levodopa 25/100, 2 tablets 3 times per day.  Records have been reviewed since our last visit.  She was in the ER in February after a fall.  The patient let go of her walker to get her purse off of her shoulder and ended up falling backwards.  She hit her head on the door frame.  CT of the brain was performed and reviewed personally.  Intracranially, it was nonacute.  There was a moderate to large left parietal scalp hematoma.  shes had a few  other falls.  She was opening a door a week ago and it came back on her and she fell.  Yesterday she had a "little" fall due to what is described as festination.  Occasionally having some diplopia but still able to read.  Has eye appt next month, Dr. Valetta Close.  Some trouble swallowing the bigger pills.  Has a dry cough, independent of eating/drinking.    PREVIOUS MEDICATIONS: none to date  ALLERGIES:   Allergies  Allergen Reactions  .  Demerol [Meperidine] Shortness Of Breath and Swelling  . Oxycodone Other (See Comments)    Hallucinations   . Adhesive [Tape] Other (See Comments)    Redness and skin tears  . Ivp Dye [Iodinated Diagnostic Agents] Hives    OK with benadryl pre-med (21m one hour before receiving iodinated contrast agent)  . Phenergan [Promethazine] Other (See Comments)    Avoids due to reaction with current medications    CURRENT MEDICATIONS:  Outpatient Encounter Medications as of 06/15/2018  Medication Sig  . acetaminophen (TYLENOL) 500 MG tablet Take 1,000 mg by mouth every 6 (six) hours as needed (head pain).   . calcium carbonate (TUMS - DOSED IN MG ELEMENTAL CALCIUM) 500 MG chewable tablet Chew 1 tablet by mouth daily.  . calcium citrate-vitamin D (CITRACAL+D) 315-200 MG-UNIT per tablet Take 1 tablet by mouth 3 (three) times daily.  . carbidopa-levodopa (SINEMET IR) 25-100 MG tablet TAKE 2 TABLETS BY MOUTH THREE TIMES DAILY  . Cholecalciferol (VITAMIN D3) 2000 units TABS Take 1 capsule by mouth daily.  . famotidine-calcium carbonate-magnesium hydroxide (PEPCID COMPLETE) 10-800-165 MG CHEW chewable tablet Chew 1 tablet by mouth daily as needed (heartburn).  .Marland KitchenKLOR-CON M20 20 MEQ tablet Take 20 mEq by mouth daily.  .Marland KitchenKRILL OIL PO Take by mouth.  . Melatonin 3 MG TABS Take by mouth.  . Multiple Vitamin (MULTIVITAMIN) tablet Take 1 tablet by mouth daily at 12 noon.   . Multiple Vitamins-Minerals (VISION-VITE PRESERVE PO) Take 1 tablet by mouth 2 (two) times daily.  .  pantoprazole (PROTONIX) 40 MG tablet Take 40 mg by mouth daily.  . Probiotic Product (PROBIOTIC-10 PO) Take by mouth.  . simvastatin (ZOCOR) 20 MG tablet Take 20 mg by mouth every evening.  . triamterene-hydrochlorothiazide (MAXZIDE-25) 37.5-25 MG per tablet Take 1 tablet by mouth every morning.   . vitamin B-12 (CYANOCOBALAMIN) 1000 MCG tablet Take 1,000 mcg by mouth every morning.  . warfarin (COUMADIN) 5 MG tablet Take 5 mg by mouth every evening. Take 7.5 mg by mouth every Tues and Sat; take 5 mg by mouth all other days   No facility-administered encounter medications on file as of 06/15/2018.     PAST MEDICAL HISTORY:   Past Medical History:  Diagnosis Date  . Anticoagulated on Coumadin   . Arthropathy of cervical facet joint    C2-3  . At risk for falls   . BPV (benign positional vertigo)   . Bruises easily   . [place]    breast-lumpectomy,nodes ,radiation,chemo  . Chronic neck pain   . CKD (chronic kidney disease), stage II   . Diverticulosis of colon   . Elevated troponin    a. 08/2016 in setting of PAF;  b. 08/2016 low risk MV w/ fixed septal defect (LBBB), EF 53%.  .Marland KitchenGERD (gastroesophageal reflux disease)   . Hiatal hernia   . History of aortic valve stenosis   . History of breast cancer per pt no recurrence   dx 2004-- Left breast DCIS (ER/PR+, HER2 negative) s/p  partial masectomy w/ sln bx and  chemoradiotion (completed 2004)  . History of colon polyps   . History of herpes zoster   . History of kidney stones   . History of kidney stones   . HTN (hypertension)   . Hyperlipidemia   . Hypertrophy of inter-atrial septum    a. 09/2014 Echo: ? of atrial mass; b. 10/2014 Cardiac MRI: no atrial septal mass - polypoid lipomatous hypertrophy of superior atrial septum  noted-->Benign.  . Irritable bowel syndrome   . LBBB (left bundle branch block)   . PAF (paroxysmal atrial fibrillation) (HCC)    a. CHA2DS2VASc = 4-->coumadin.  . Parkinsonism [facility])    neurologist-   dr Auden Wettstein  . Personal history of chemotherapy 2004  . Personal history of radiation therapy 2004  . PONV (postoperative nausea and vomiting)   . PVC's (premature ventricular contractions)   . Right ureteral stone   . S/P aortic valve replacement with prosthetic valve    a. 04/13/2006 s/p St Jude mechnical AVR for severe AS;  b. 09/2014 Echo: EF 40-45%, gr1 DD, mild AI/MR, triv TR/PR.  . SUI (stress urinary incontinence, female)   . Unstable balance    uses cane  . Venous varices      PAST SURGICAL HISTORY:   Past Surgical History:  Procedure Laterality Date  . ANTERIOR AND POSTERIOR REPAIR  1990   cystocele/ rectocele  . AORTIC VALVE REPLACEMENT  04/13/06   dr gerhardt   and Replacement Ascending Aorta (St. Jude mechanical prosthesis)  . APPENDECTOMY  child 1950  . BREAST LUMPECTOMY Left 01/11/2003   malignant  . BREAST SURGERY Left 2004  . CARDIAC CATHETERIZATION  02-22-2001   dr Martinique   minimal non-obstructive cad/  mild aortic stenosis and aortic root enlargement/  normal LVF, ef 65%  . CARDIAC CATHETERIZATION  03-22-2006    dr Martinique   no significant obstructive cad/  severe aortic stenosis and mild to moderate aortic root enlargement/  upper normal right heart pressures  . CARDIOVASCULAR STRESS TEST  09-30-2011   dr Martinique   lexiscan nuclear study w/ apical thinning  vs  small prior apical infarct w/ no significant ischemia/  hypokinesis of the distal septum and apex, ef 50%  . CARPAL TUNNEL RELEASE Bilateral left 09-09-2004/  right 2007  . CATARACT EXTRACTION W/ INTRAOCULAR LENS  IMPLANT, BILATERAL  2015  . COLONOSCOPY N/A 10/03/2014   Procedure: COLONOSCOPY;  Surgeon: [name], MD;  Location: [place];  Service: Endoscopy;  Laterality: N/A;  . CYSTOSCOPY WITH RETROGRADE PYELOGRAM, URETEROSCOPY AND STENT PLACEMENT Right 09/17/2016   Procedure: CYSTOSCOPY WITH RIGHT RETROGRADE PYELOGRAM, RIGHT URETEROSCOPY AND STENT PLACEMENT LASER LITHOTRIPSY, RIGHT STENT PLACEMENT;   Surgeon: [name], MD;  [facility];  Service: Urology;  Laterality: Right;  . ESOPHAGEAL MANOMETRY N/A 08/12/2014   Procedure: ESOPHAGEAL MANOMETRY (EM);  Surgeon: [name], MD;  Location: [facility];  Service: Endoscopy;  Laterality: N/A;  . ESOPHAGOGASTRODUODENOSCOPY N/A 10/03/2014   Procedure: ESOPHAGOGASTRODUODENOSCOPY (EGD);  Surgeon: [name], MD;  Location: [facility];  Service: Endoscopy;  Laterality: N/A;  . [place]  . FOOT SURGERY Right 2013   hammertoe x2  . HOLMIUM LASER APPLICATION Right 4/33/2951   Procedure: HOLMIUM LASER APPLICATION;  Surgeon: [name], MD;  Location: [facility];  Service: Urology;  Laterality: Right;  . PERCUTANEOUS NEPHROSTOLITHOTOMY  1993  . STONE EXTRACTION WITH BASKET Right 09/17/2016   Procedure: STONE EXTRACTION WITH BASKET;  Surgeon: [name], MD;  Location: [facility];  Service: Urology;  Laterality: Right;  . TOTAL ABDOMINAL HYSTERECTOMY  1971   w/ Bilateral Salpingoophorectomy  . TRANSTHORACIC ECHOCARDIOGRAM  10/24/2014   dr Martinique   hypokinesis of the basal and mid inferoseptal and inferior walls,  ef 40-45%,  grade 1 diastolic dysfunction/  mechanical bileaflet aortic valve noted w/ mild regur., peak grandient 67mHg, valve area  1.35cm^2/  severe MV calcification without stenosis w/ mild regurg., peak gradient 34mHg/  mild LAE/  poss. atrial mass (MRI showed benign lipomatous hypertrophy of atrial septum) /Frazier ButtPR/mild TR    SOCIAL HISTORY:   Social History   Socioeconomic History  . Marital status: Married    Spouse name: Not on file  . Number of children: 3  . Years of education: 10  . Highest education level: Not on file  Occupational History  . 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 Financial resource strain: Not on file  . Food insecurity:    Worry:  Not on file    Inability: Not on file  . Transportation needs:    Medical: Not on file    Non-medical: Not on file  Tobacco Use  . Smoking status: Never Smoker  . Smokeless tobacco: Never Used  Substance and Sexual Activity  . Alcohol use: No    Alcohol/week: 0.0 oz  . Drug use: No  . Sexual activity: Not on file  Lifestyle  . Physical activity:    Days per week: Not on file    Minutes per session: Not on file  . Stress: Not on file  Relationships  . Social connections:    Talks on phone: Not on file    Gets together: Not on file    Attends religious service: Not on file    Active member of club or organization: Not on file    Attends meetings of clubs or organizations: Not on file    Relationship status: Not on file  . Intimate partner violence:    Fear of current or ex partner: Not on file    Emotionally abused: Not on file    Physically abused: Not on file    Forced sexual activity: Not on file  Other Topics Concern  . Not on file  Social History Narrative      1/2 -1 soda a day      FAMILY HISTORY:   Family Status  Relation Name Status  . Mother  Deceased at age 8947 . Father  Deceased at age 78 . Brother  Deceased       colon cancer  . Brother  Deceased       accident  . Brother  Deceased       accident  . Brother  Alive       colon cancer  . Brother  Other  . Sister  Alive  . Sister  Alive  . Sister  Alive  . MGM  Deceased  . MGF  Deceased  . PGM  Deceased  . PGF  Deceased  . Brother  (Not Specified)    ROS:  Review of Systems  Constitutional: Negative.   HENT: Negative.   Respiratory: Positive for cough.   Genitourinary: Negative.   Skin: Negative.     PHYSICAL EXAMINATION:    VITALS:   Vitals:   06/15/18 0749  BP: 134/74  Pulse: 70  SpO2: 96%  Weight: 137 lb (62.1 kg)  Height: 4' 11.75" (1.518 m)    GEN:  The patient appears stated age and is in NAD. HEENT:  Normocephalic, atraumatic.  The mucous membranes are moist. The  superficial temporal arteries are without ropiness or tenderness. CV:  RRR Lungs:  CTAB Neck/HEME:  There are no carotid bruits bilaterally.  Neck is turned to the left.  Hand on chin (geste antagoniste)  Neurological examination:  Orientation: The patient is alert and oriented x3.  Cranial nerves: There is mild R facial droop at rest but able to activate the muscles well.  There is facial hypomimia.   There is occasional L hemifacial spasm. Extraocular muscles are intact. No square wave jerks.  The visual fields are full to confrontational testing. The speech is fluent and clear. She is hypophonic.  Minimal trouble with gutteral sounds.  Soft palate rises symmetrically and there is no tongue deviation. Hearing is intact to conversational tone. Sensation: Sensation is intact to light touch throughout. Motor: Strength is 5/5 in the bilateral upper and lower extremities.   Shoulder shrug is equal and symmetric.  There is no pronator drift.   Movement examination: Tone: There is normal tone in the bilateral upper extremities.  The tone in the lower extremities is normal.  Abnormal movements: no tremor today.  There is some facial dyskinesia on the left Coordination:  There is decremation, with any form of RAMS, including alternating supination and pronation of the forearm, hand opening and closing, finger taps, heel taps and toe taps, more in the LE than UE Gait and Station: The patient pushes off of the chair. The patient is shuffling less.  Only trouble that she had was in the turn and turned en bloc  ASSESSMENT/PLAN:  1.  Probable PSP  -I do not think that the patient has idiopathic Parkinsons Disease but rather one of the atypical parkinsonian states, and possibly Progressive Supranuclear Palsy (PSP).  We talked about nature, etiology and pathophysiology. We talked about how the symptoms, course and prognosis differ from Parkinson's Disease.  We talked about the risks, particularly for falls  and aspiration.   -continue carbidopa/levodopa 25/100, 2 po tid  -talked about PT.  She doesn't want to go right now.  She had eval in 03/2018 and felt didn't need it.  Has re-evaluation in jan  -if husband not directly holding onto her, use walker at all times and do not let go  -talked to her about advanced directives.   Neither pt/husband has these.  Talked about importance.  Will have social worker work with pt/husband 2.  Neck pain  -I think this is multifactorial.  She did have an MRI of the cervical spine on 06/03/2016 that demonstrated severe C2-C3 left facet arthropathy with joint effusion and bone marrow edema.  She has received injections for this.  She also has evidence of cervical dystonia, although this surprisingly has improved some with levodopa. Neck pain is better 3.  cough  -doesn't sound like aspiration but will do MBE.  Last was 04/2016 and was normal. 4.  F/u 5-6 months.  Much greater than 50% of this visit was spent in counseling and coordinating care.  Total face to face time:  25 min

## 2018-06-15 ENCOUNTER — Ambulatory Visit: Payer: PPO | Admitting: Neurology

## 2018-06-15 ENCOUNTER — Encounter: Payer: Self-pay | Admitting: Neurology

## 2018-06-15 ENCOUNTER — Telehealth: Payer: Self-pay | Admitting: Neurology

## 2018-06-15 VITALS — BP 134/74 | HR 70 | Ht 59.75 in | Wt 137.0 lb

## 2018-06-15 DIAGNOSIS — G231 Progressive supranuclear ophthalmoplegia [Steele-Richardson-Olszewski]: Secondary | ICD-10-CM

## 2018-06-15 DIAGNOSIS — R1319 Other dysphagia: Secondary | ICD-10-CM

## 2018-06-15 NOTE — Telephone Encounter (Signed)
We have scheduled you at [facility] for your modified barium swallow on 06/22/18 at 11:00 am. Please arrive 15 minutes prior and go to 1st floor radiology. If you need to reschedule for any reason please call [phone].  Called patient to make her aware of information above and gave her the number to r/s if this date/time did not work.

## 2018-06-15 NOTE — Telephone Encounter (Signed)
Pt called and left a voicemail requesting to speak with the nurse. Best call back # [phone].

## 2018-06-15 NOTE — Addendum Note (Signed)
Addended byAnnamaria Helling on: 06/15/2018 01:12 PM   Modules accepted: Orders

## 2018-06-15 NOTE — Patient Instructions (Signed)
Continue carbidopa/levodopa 25/100, 2 three times per day.

## 2018-06-15 NOTE — Telephone Encounter (Signed)
Gave them the number again.

## 2018-06-16 ENCOUNTER — Other Ambulatory Visit (HOSPITAL_COMMUNITY): Payer: Self-pay | Admitting: Neurology

## 2018-06-16 DIAGNOSIS — R1310 Dysphagia, unspecified: Secondary | ICD-10-CM

## 2018-06-19 ENCOUNTER — Telehealth: Payer: Self-pay | Admitting: Psychology

## 2018-06-19 NOTE — Telephone Encounter (Signed)
Patient's husband requested information on advance directives when in the clinic.  I contacted him today and left a message indicating that I am more than happy to help him and provide resources as needed or help complete the forms as appropriate.  I left my contact information and will wait for return call.

## 2018-06-20 ENCOUNTER — Ambulatory Visit: Payer: PPO

## 2018-06-20 ENCOUNTER — Ambulatory Visit
Admission: RE | Admit: 2018-06-20 | Discharge: 2018-06-20 | Disposition: A | Discharge status: Home / Self Care | Payer: PPO | Source: Ambulatory Visit | Attending: Internal Medicine | Admitting: Internal Medicine

## 2018-06-20 DIAGNOSIS — Z1231 Encounter for screening mammogram for malignant neoplasm of breast: Secondary | ICD-10-CM

## 2018-06-21 DIAGNOSIS — Z7901 Long term (current) use of anticoagulants: Secondary | ICD-10-CM | POA: Diagnosis not present

## 2018-06-21 DIAGNOSIS — I358 Other nonrheumatic aortic valve disorders: Secondary | ICD-10-CM | POA: Diagnosis not present

## 2018-06-22 ENCOUNTER — Ambulatory Visit (HOSPITAL_COMMUNITY): Payer: PPO

## 2018-06-22 ENCOUNTER — Encounter (HOSPITAL_COMMUNITY): Payer: PPO

## 2018-06-23 ENCOUNTER — Ambulatory Visit (HOSPITAL_COMMUNITY)
Admission: RE | Admit: 2018-06-23 | Discharge: 2018-06-23 | Disposition: A | Discharge status: Home / Self Care | Payer: PPO | Source: Ambulatory Visit | Attending: Neurology | Admitting: Neurology

## 2018-06-23 DIAGNOSIS — K225 Diverticulum of esophagus, acquired: Secondary | ICD-10-CM | POA: Insufficient documentation

## 2018-06-23 DIAGNOSIS — R1319 Other dysphagia: Secondary | ICD-10-CM | POA: Insufficient documentation

## 2018-06-23 DIAGNOSIS — R1310 Dysphagia, unspecified: Secondary | ICD-10-CM | POA: Diagnosis not present

## 2018-06-23 NOTE — Progress Notes (Signed)
Modified Barium Swallow Progress Note  Patient Details  Name: [name] MRN: [id number] Date of Birth: 29-Aug-1941  Today's Date: 06/23/2018  Modified Barium Swallow completed.  Full report located under Chart Review in the Imaging Section.  Brief recommendations include the following:  Clinical Impression  Pt's oropharyngeal swallow is WFL with no aspiration observed. She did have intermittent coughing, with one particularly strong episode while scanning the esophagus, but no penetration or aspiration was observed. She did have a small Zenker's diverticulum (as confirmed by MD) with minimal amounts of rentention that would clear with additional boluses. Would continue with regular diet textures and thin liquids as tolerated following general precautions for aspiration and esophageal hx.   Swallow Evaluation Recommendations       SLP Diet Recommendations: Regular solids;Thin liquid   Liquid Administration via: Cup;Straw   Medication Administration: Whole meds with liquid   Supervision: Patient able to self feed;Intermittent supervision to cue for compensatory strategies   Compensations: Slow rate;Small sips/bites;Follow solids with liquid   Postural Changes: Seated upright at 90 degrees;Remain semi-upright after after feeds/meals (Comment)   Oral Care Recommendations: Oral care BID        Germain Osgood 06/23/2018,12:12 PM   Germain Osgood, M.A. CCC-SLP [phone]

## 2018-07-10 DIAGNOSIS — M810 Age-related osteoporosis without current pathological fracture: Secondary | ICD-10-CM | POA: Diagnosis not present

## 2018-07-11 DIAGNOSIS — Z1212 Encounter for screening for malignant neoplasm of rectum: Secondary | ICD-10-CM | POA: Diagnosis not present

## 2018-07-25 DIAGNOSIS — I358 Other nonrheumatic aortic valve disorders: Secondary | ICD-10-CM | POA: Diagnosis not present

## 2018-07-25 DIAGNOSIS — Z7901 Long term (current) use of anticoagulants: Secondary | ICD-10-CM | POA: Diagnosis not present

## 2018-08-24 DIAGNOSIS — I358 Other nonrheumatic aortic valve disorders: Secondary | ICD-10-CM | POA: Diagnosis not present

## 2018-08-24 DIAGNOSIS — Z23 Encounter for immunization: Secondary | ICD-10-CM | POA: Diagnosis not present

## 2018-08-24 DIAGNOSIS — Z7901 Long term (current) use of anticoagulants: Secondary | ICD-10-CM | POA: Diagnosis not present

## 2018-09-05 DIAGNOSIS — L7211 Pilar cyst: Secondary | ICD-10-CM | POA: Diagnosis not present

## 2018-09-05 DIAGNOSIS — D229 Melanocytic nevi, unspecified: Secondary | ICD-10-CM | POA: Diagnosis not present

## 2018-09-05 DIAGNOSIS — L821 Other seborrheic keratosis: Secondary | ICD-10-CM | POA: Diagnosis not present

## 2018-09-26 DIAGNOSIS — M25562 Pain in left knee: Secondary | ICD-10-CM | POA: Diagnosis not present

## 2018-09-26 DIAGNOSIS — M25569 Pain in unspecified knee: Secondary | ICD-10-CM | POA: Insufficient documentation

## 2018-09-28 DIAGNOSIS — I358 Other nonrheumatic aortic valve disorders: Secondary | ICD-10-CM | POA: Diagnosis not present

## 2018-09-28 DIAGNOSIS — Z7901 Long term (current) use of anticoagulants: Secondary | ICD-10-CM | POA: Diagnosis not present

## 2018-10-01 ENCOUNTER — Other Ambulatory Visit: Payer: Self-pay | Admitting: Neurology

## 2018-10-02 DIAGNOSIS — Z6827 Body mass index (BMI) 27.0-27.9, adult: Secondary | ICD-10-CM | POA: Diagnosis not present

## 2018-10-02 DIAGNOSIS — J339 Nasal polyp, unspecified: Secondary | ICD-10-CM | POA: Diagnosis not present

## 2018-10-08 NOTE — Progress Notes (Signed)
Office Visit    Patient Name: [name] Date of Encounter: 10/09/2018  Primary Care Provider:  Leanna Battles, MD Primary Cardiologist:  P. Martinique, MD   Chief Complaint    77 y/o ? with a h/o severe AS s/p SJM AVR and PAF seen for follow up.  Past Medical History    Past Medical History:  Diagnosis Date  . Anticoagulated on Coumadin   . Arthropathy of cervical facet joint    C2-3  . At risk for falls   . BPV (benign positional vertigo)   . Bruises easily   . Cancer ([place]) 2004    breast-lumpectomy,nodes ,radiation,chemo  . Chronic neck pain   . CKD (chronic kidney disease), stage II   . Diverticulosis of colon   . Elevated troponin    a. 08/2016 in setting of PAF;  b. 08/2016 low risk MV w/ fixed septal defect (LBBB), EF 53%.  Marland Kitchen GERD (gastroesophageal reflux disease)   . Hiatal hernia   . History of aortic valve stenosis   . History of breast cancer per pt no recurrence   dx 2004-- Left breast DCIS (ER/PR+, HER2 negative) s/p  partial masectomy w/ sln bx and  chemoradiotion (completed 2004)  . History of colon polyps   . History of herpes zoster   . History of kidney stones   . History of kidney stones   . HTN (hypertension)   . Hyperlipidemia   . Hypertrophy of inter-atrial septum    a. 09/2014 Echo: ? of atrial mass; b. 10/2014 Cardiac MRI: no atrial septal mass - polypoid lipomatous hypertrophy of superior atrial septum noted-->Benign.  . Irritable bowel syndrome   . LBBB (left bundle branch block)   . PAF (paroxysmal atrial fibrillation) (HCC)    a. CHA2DS2VASc = 4-->coumadin.  . Parkinsonism [facility])    neurologist-  dr tat  . Personal history of chemotherapy 2004  . Personal history of radiation therapy 2004  . PONV (postoperative nausea and vomiting)   . PVC's (premature ventricular contractions)   . Right ureteral stone   . S/P aortic valve replacement with prosthetic valve    a. 04/13/2006 s/p St Jude mechnical AVR for severe AS;  b. 09/2014  Echo: EF 40-45%, gr1 DD, mild AI/MR, triv TR/PR.  . SUI (stress urinary incontinence, female)   . Unstable balance    uses cane  . Venous varices     Past Surgical History:  Procedure Laterality Date  . ANTERIOR AND POSTERIOR REPAIR  1990   cystocele/ rectocele  . AORTIC VALVE REPLACEMENT  04/13/06   dr gerhardt   and Replacement Ascending Aorta (St. Jude mechanical prosthesis)  . APPENDECTOMY  child 1950  . BREAST LUMPECTOMY Left 01/11/2003   malignant  . BREAST SURGERY Left 2004  . CARDIAC CATHETERIZATION  02-22-2001   dr Martinique   minimal non-obstructive cad/  mild aortic stenosis and aortic root enlargement/  normal LVF, ef 65%  . CARDIAC CATHETERIZATION  03-22-2006    dr Martinique   no significant obstructive cad/  severe aortic stenosis and mild to moderate aortic root enlargement/  upper normal right heart pressures  . CARDIOVASCULAR STRESS TEST  09-30-2011   dr Martinique   lexiscan nuclear study w/ apical thinning  vs  small prior apical infarct w/ no significant ischemia/  hypokinesis of the distal septum and apex, ef 50%  . CARPAL TUNNEL RELEASE Bilateral left 09-09-2004/  right 2007  . CATARACT EXTRACTION W/ INTRAOCULAR LENS  IMPLANT,  BILATERAL  2015  . COLONOSCOPY N/A 10/03/2014   Procedure: COLONOSCOPY;  Surgeon: [name], MD;  Location: [place];  Service: Endoscopy;  Laterality: N/A;  . CYSTOSCOPY WITH RETROGRADE PYELOGRAM, URETEROSCOPY AND STENT PLACEMENT Right 09/17/2016   Procedure: CYSTOSCOPY WITH RIGHT RETROGRADE PYELOGRAM, RIGHT URETEROSCOPY AND STENT PLACEMENT LASER LITHOTRIPSY, RIGHT STENT PLACEMENT;  Surgeon: [name], MD;  Location: [facility];  Service: Urology;  Laterality: Right;  . ESOPHAGEAL MANOMETRY N/A 08/12/2014   Procedure: ESOPHAGEAL MANOMETRY (EM);  Surgeon: [name], MD;  Location: [facility];  Service: Endoscopy;  Laterality: N/A;  . ESOPHAGOGASTRODUODENOSCOPY N/A 10/03/2014   Procedure: ESOPHAGOGASTRODUODENOSCOPY  (EGD);  Surgeon: [name], MD;  Location: [facility] ENDOSCOPY;  Service: Endoscopy;  Laterality: N/A;  . [place]  . FOOT SURGERY Right 2013   hammertoe x2  . HOLMIUM LASER APPLICATION Right 8/82/8003   Procedure: HOLMIUM LASER APPLICATION;  Surgeon: [name], MD;  Location: [facility];  Service: Urology;  Laterality: Right;  . PERCUTANEOUS NEPHROSTOLITHOTOMY  1993  . STONE EXTRACTION WITH BASKET Right 09/17/2016   Procedure: STONE EXTRACTION WITH BASKET;  Surgeon: [name], MD;  Location[facility];  Service: Urology;  Laterality: Right;  . TOTAL ABDOMINAL HYSTERECTOMY  1971   w/ Bilateral Salpingoophorectomy  . TRANSTHORACIC ECHOCARDIOGRAM  10/24/2014   dr Martinique   hypokinesis of the basal and mid inferoseptal and inferior walls,  ef 40-45%,  grade 1 diastolic dysfunction/  mechanical bileaflet aortic valve noted w/ mild regur., peak grandient 22mHg, valve area 1.35cm^2/  severe MV calcification without stenosis w/ mild regurg., peak gradient 357mg/  mild LAE/  poss. atrial mass (MRI showed benign lipomatous hypertrophy of atrial septum) /tFrazier ButtR/mild TR    Allergies  Allergies  Allergen Reactions  . Demerol [Meperidine] Shortness Of Breath and Swelling  . Oxycodone Other (See Comments)    Hallucinations   . Adhesive [Tape] Other (See Comments)    Redness and skin tears  . Ivp Dye [Iodinated Diagnostic Agents] Hives    OK with benadryl pre-med (5036mne hour before receiving iodinated contrast agent)  . Phenergan [Promethazine] Other (See Comments)    Avoids due to reaction with current medications    History of Present Illness    [age]o ? with a h/o severe AS s/p SJM mech AVR in 2007. She also has a h/o PAF and is chronically anticoagulated with coumadin.  She has a history of bradycardia on beta blocker and this was DC'd.   September 2017 following a urologic procedure  she developed abd  pain, nausea, c/p, sob, and palpitations, prompting her to call EMS.  She was found to be in AF with RVR, which later converted to sinus rhythm.  [facility].  She  underwent stress testing, which was low risk.    She was seen in October 2017 by Dr KleCaryl Comeso felt her Afib episodes were infrequent and recommended following for now. If episodes became more frequent then consider resumption of beta blocker or AAD without rate slowing effects such as Tikosyn.   She does have a history of atypical Parikinsons and progressive supranuclear palsy followed by Neurology.   On follow up today she reports she is doing well. Denies any chest pain, SOB, dizziness or syncope. Only notes irregular pulse occasionally. No edema.   Current Meds  Medication Sig  . acetaminophen (TYLENOL) 500  MG tablet Take 1,000 mg by mouth every 6 (six) hours as needed (head pain).   . calcium carbonate (TUMS - DOSED IN MG ELEMENTAL CALCIUM) 500 MG chewable tablet Chew 1 tablet by mouth daily.  . calcium citrate-vitamin D (CITRACAL+D) 315-200 MG-UNIT per tablet Take 1 tablet by mouth 3 (three) times daily.  . carbidopa-levodopa (SINEMET IR) 25-100 MG tablet TAKE 2 TABLETS BY MOUTH THREE TIMES DAILY  . Cholecalciferol (VITAMIN D3) 2000 units TABS Take 1 capsule by mouth daily.  . famotidine-calcium carbonate-magnesium hydroxide (PEPCID COMPLETE) 10-800-165 MG CHEW chewable tablet Chew 1 tablet by mouth daily as needed (heartburn).  Marland Kitchen KLOR-CON M20 20 MEQ tablet Take 20 mEq by mouth daily.  Marland Kitchen KRILL OIL PO Take by mouth.  . Melatonin 3 MG TABS Take by mouth.  . Multiple Vitamin (MULTIVITAMIN) tablet Take 1 tablet by mouth daily at 12 noon.   . Multiple Vitamins-Minerals (VISION-VITE PRESERVE PO) Take 1 tablet by mouth 2 (two) times daily.  . pantoprazole (PROTONIX) 40 MG tablet Take 40 mg by mouth daily.  . Probiotic Product (PROBIOTIC-10 PO) Take by mouth.  . simvastatin (ZOCOR) 20 MG  tablet Take 20 mg by mouth every evening.  . triamterene-hydrochlorothiazide (MAXZIDE-25) 37.5-25 MG per tablet Take 1 tablet by mouth every morning.   . vitamin B-12 (CYANOCOBALAMIN) 1000 MCG tablet Take 1,000 mcg by mouth every morning.  . warfarin (COUMADIN) 5 MG tablet Take 5 mg by mouth. Take 7.5 mg by mouth every Monday, Wednesday, Friday. Take 5 mg by mouth all other days.      Review of Systems    As noted in HPI.  All other systems reviewed and are otherwise negative except as noted above.  Physical Exam    VS:  BP 120/82 (BP Location: Right Arm, Patient Position: Sitting, Cuff Size: Normal)   Pulse 85   Ht 4' 11.75" (1.518 m)   Wt 138 lb 6.4 oz (62.8 kg)   BMI 27.26 kg/m  , BMI Body mass index is 27.26 kg/m. GENERAL:  Well appearing WF in NAD HEENT:  PERRL, EOMI, sclera are clear. Oropharynx is clear. NECK:  No jugular venous distention, carotid upstroke brisk and symmetric, no bruits, no thyromegaly or adenopathy LUNGS:  Clear to auscultation bilaterally CHEST:  Unremarkable HEART:  IRRR,  PMI not displaced or sustained, good mechanical AV click with gr 2/6 systolic ejection murmur.  no S3, no S4: no clicks, no rubs, no murmurs ABD:  Soft, nontender. BS +, no masses or bruits. No hepatomegaly, no splenomegaly EXT:  2 + pulses throughout, no edema, no cyanosis no clubbing SKIN:  Warm and dry.  No rashes NEURO:  Alert and oriented x 3. Cranial nerves II through XII intact. PSYCH:  Cognitively intact   Accessory Clinical Findings    Lab Results  Component Value Date   WBC 4.7 09/21/2016   HGB 9.4 (L) 09/21/2016   HCT 28.9 (L) 09/21/2016   PLT 161 09/21/2016   GLUCOSE 124 (H) 09/21/2016   CHOL 145 09/30/2011   TRIG 42 09/30/2011   HDL 77 09/30/2011   LDLCALC 60 09/30/2011   ALT 7 (L) 09/19/2016   AST 36 09/19/2016   NA 135 09/21/2016   K 3.6 09/21/2016   CL 103 09/21/2016   CREATININE 1.58 (H) 09/21/2016   BUN 17 09/21/2016   CO2 25 09/21/2016   INR 2.25  02/12/2018   Labs dated 05/10/16: cholesterol 171, triglycerides 81, HDL 58, LDL 97. Dated 05/19/17: cholesterol 166, triglycerides  73, HDL 63, LDL 88. Creatinine 1.4, Hgb 12.1. ALT normal. Dated 03/07/18: Hgb 12.6, creatinine 1.2 Dated 05/30/18: cholesterol 155, triglycerides 104, HDL 62, LDL 72. Hgb 11.9, creatinine 1.4. Other chemistries normal.  Ecg today shows Afib with rate 85. LBBB. Compared to last year Afib is new. I have personally reviewed and interpreted this study.   Assessment & Plan    1.  PAF/Tachy-brady syndrome:  Pt is in Afib today. Rate is well controlled on no rate slowing medication and she is asymptomatic.  Given history of severe brady on beta blockers we will not start any rate control or AAD therapy.  I will follow up in 6 months.  2.  S/P SJM AVR:  Stable on echo in 2015. We will repeat an Echo now with recurrent Afib. continue Chronic coumadin. Normal valve click on exam.  3.  Essential HTN:  Controlled  4.  HLD:  well controlled. Cont statin.   Luke Rigsbee Martinique, MD, [facility] 10/09/2018, 3:12 PM

## 2018-10-09 ENCOUNTER — Ambulatory Visit (INDEPENDENT_AMBULATORY_CARE_PROVIDER_SITE_OTHER): Payer: PPO | Admitting: Cardiology

## 2018-10-09 ENCOUNTER — Encounter: Payer: Self-pay | Admitting: Cardiology

## 2018-10-09 VITALS — BP 120/82 | HR 85 | Ht 59.75 in | Wt 138.4 lb

## 2018-10-09 DIAGNOSIS — E7800 Pure hypercholesterolemia, unspecified: Secondary | ICD-10-CM

## 2018-10-09 DIAGNOSIS — I495 Sick sinus syndrome: Secondary | ICD-10-CM

## 2018-10-09 DIAGNOSIS — J3489 Other specified disorders of nose and nasal sinuses: Secondary | ICD-10-CM | POA: Diagnosis not present

## 2018-10-09 DIAGNOSIS — I10 Essential (primary) hypertension: Secondary | ICD-10-CM | POA: Diagnosis not present

## 2018-10-09 DIAGNOSIS — Z952 Presence of prosthetic heart valve: Secondary | ICD-10-CM | POA: Diagnosis not present

## 2018-10-09 DIAGNOSIS — J339 Nasal polyp, unspecified: Secondary | ICD-10-CM | POA: Diagnosis not present

## 2018-10-09 DIAGNOSIS — I480 Paroxysmal atrial fibrillation: Secondary | ICD-10-CM

## 2018-10-09 NOTE — Patient Instructions (Signed)
Continue your current therapy  We will schedule you for an Echocardiogram   

## 2018-10-09 NOTE — Addendum Note (Signed)
Addended by: Kathyrn Lass on: 10/09/2018 03:18 PM   Modules accepted: Orders, SmartSet

## 2018-10-17 ENCOUNTER — Other Ambulatory Visit: Payer: Self-pay

## 2018-10-17 ENCOUNTER — Ambulatory Visit (HOSPITAL_COMMUNITY): Payer: PPO | Attending: Cardiovascular Disease

## 2018-10-17 DIAGNOSIS — I495 Sick sinus syndrome: Secondary | ICD-10-CM

## 2018-10-17 DIAGNOSIS — I10 Essential (primary) hypertension: Secondary | ICD-10-CM | POA: Diagnosis not present

## 2018-10-17 DIAGNOSIS — I480 Paroxysmal atrial fibrillation: Secondary | ICD-10-CM

## 2018-10-17 DIAGNOSIS — E7800 Pure hypercholesterolemia, unspecified: Secondary | ICD-10-CM | POA: Diagnosis not present

## 2018-10-17 DIAGNOSIS — Z952 Presence of prosthetic heart valve: Secondary | ICD-10-CM | POA: Diagnosis not present

## 2018-10-26 ENCOUNTER — Other Ambulatory Visit: Payer: Self-pay | Admitting: Dermatology

## 2018-10-26 DIAGNOSIS — L7211 Pilar cyst: Secondary | ICD-10-CM | POA: Diagnosis not present

## 2018-11-02 DIAGNOSIS — Z7901 Long term (current) use of anticoagulants: Secondary | ICD-10-CM | POA: Diagnosis not present

## 2018-11-02 DIAGNOSIS — I358 Other nonrheumatic aortic valve disorders: Secondary | ICD-10-CM | POA: Diagnosis not present

## 2018-11-20 NOTE — Progress Notes (Signed)
[name] was seen today in the movement disorders clinic for neurologic consultation at the request of Dr Tomi Likens for a 2nd opinion regarding gait instability.  She is accompanied by her husband who supplements the hx.  The records that were made available to me were reviewed.  Pt reports that she has been having falls, the first of which was several years ago but then she began to have more regular falls 6-8 months ago.  Most falls are when she turns and she notes that the feet will shuffle and the "body will turn and the feet will stay."  Her husband states that her feet will stay like "they are stuck on bumble gum."  No falls in last 2 weeks.  She has been in rehab recently.    07/06/16 update:  The patient is following up today regarding probable PSP.  Went to the atypical symposium I had recommended and they thought that it was very informative.  She had a modified barium swallow on 05/20/2016 that recommended a regular diet with thin liquids.  She has been participating with physical and occupational therapy since last visit.  I started her on levodopa (6am/12noon/5pm) and she has not noticed a great difference with that. No SE.  Has had 2 falls.  With one she tripped over her cane and with one she fell changing her sheets.   This was very low dose, and a benefit was not expected at this low dose.  She did see Dr. Rexene Alberts in 06/22/2016 for sleep apnea.  A sleep study was recommended.  Pt states that she hasn't scheduled that yet.  States that she had an injection in neck for pain 2 weeks ago and thinks that it has helped some.  States that she is contemplating cervical spine surgery.  She did have an MRI of the cervical spine on 06/03/2016 that demonstrated severe C2-C3 left facet arthropathy with joint effusion and bone marrow edema.  11/23/16 update:  Pt f/u today.  Last visit, I increased her carbidopa/levodopa 25/100 to 2 po tid.  She states that she noted no great difference with that.   Multiple falls over the last few weeks (5) for no reason.  One time she tripped over a threshold.  One time she fell in a bathroom.  One time she was working with the christmas tree.  All the falls except one were forward.   Unfortunately, the PSP study that was supposed to be done local had a site change and the closest site was [place], [place].  They also were told that she wasn't a good candidate because of the coumadin.  Had 2 kidney stones since last visit.  On oxybutinin.  Seeing urology this afternoon.  04/07/17 update:  Patient follows up today.  She is on carbidopa/levodopa 25/100, 2 tablets 3 times per day.  She has had 2   falls since our last visit.  She was in the emergency room on 03/19/2017 after she fell backward and hit her head.  She was stepping backwards to put trash in the trashcan.  She had the walker but was going backward.  I reviewed the emergency room notes.  I also reviewed her CT of the brain which was done that day.  It was unremarkable and demonstrated chronic white matter disease.  The other fall was in the bathroom and she was turning around to look at her hair.  She hit her bottom.  She has attended speech, occupational and physical therapy  since our last visit and those notes were reviewed.  She has had 1 day of lightheadedness but no near syncope.  She states that her mood has been good.  She is sleeping well.  She has had some intermittent diplopia.  08/11/17 update:  Patient seen in follow-up today for her atypical parkinsonism.  This patient is accompanied in the office by her spouse who supplements the history.  Patient is on carbidopa/levodopa 25/100, 2 tablets 3 times per day.  She has had one fall since last visit.  I have reviewed records made available to me.  She was in the emergency room on June 8 with a fall.  The fall actually happened a week and a half prior to her presentation to the emergency room.  She was trying to back up and fell over the walker.  She had a  CT of the brain and cervical spine.  With the exception of a posterior scalp hematoma, it was otherwise unremarkable.   She was given a RX for metoclopramide and husband asks me about why that is.  They didn't get it filled because they read it shouldn't be taken if patient has parkinsonism.  Husband states she had no nausea.  Using walker and no other falls besides one described.  They changed to a lower bed because was having trouble getting up on other one.  She continues to have some intermittent diplopia.  Since our last visit, the patient did see her urologist and she states that she had a kidney stone removed.  01/12/18 update: Patient is seen today in follow-up for atypical parkinsonism.  She is accompanied by her husband who supplements history.  She is on carbidopa/levodopa 25/100, 2 tablets 3 times per day.  I have reviewed records made available to me since last visit.  She was in the emergency room on December 26, 2017 after a fall.  She was trying to get something out of the refrigerator and tripped over her walker and fell and hit her head.  CT of the brain was completed and was nonacute.  No other falls besides yesterday.  No issues with swallowing.  No diplopia.  She is doing the bike for exercise but only 5 min at a time, twice per day.  Her cardiology notes from Dr. Martinique were reviewed as well.  06/15/18 update: Patient is seen today in follow-up for atypical parkinsonism.  She is accompanied by her husband who supplements history.  Patient is on carbidopa/levodopa 25/100, 2 tablets 3 times per day.  Records have been reviewed since our last visit.  She was in the ER in February after a fall.  The patient let go of her walker to get her purse off of her shoulder and ended up falling backwards.  She hit her head on the door frame.  CT of the brain was performed and reviewed personally.  Intracranially, it was nonacute.  There was a moderate to large left parietal scalp hematoma.  shes had a few  other falls.  She was opening a door a week ago and it came back on her and she fell.  Yesterday she had a "little" fall due to what is described as festination.  Occasionally having some diplopia but still able to read.  Has eye appt next month, Dr. Valetta Close.  Some trouble swallowing the bigger pills.  Has a dry cough, independent of eating/drinking.    11/20/18 update: Patient seen today in follow-up for atypical parkinsonism, accompanied by her husband who  supplements history.  Patient is on carbidopa/levodopa 25/100, 2 tablets 3 times per day.  She has had 1 fall per week.  She hasn't gotten hurt.  Sometimes she has the walker and sometimes not.  Most falls have been in the kitchen.  She has had some diplopia.  Just got new glasses.  She had a modified barium swallow completed on June 23, 2018.  This was normal.  Still having coughing that she thinks is related to hiatal hernia.  Cardiology records from October were reviewed from Dr. Martinique.  She was in atrial fibrillation at the time, but rate was well controlled.  No new medications were started.  She is on Coumadin.  PREVIOUS MEDICATIONS: none to date  ALLERGIES:   Allergies  Allergen Reactions  . Demerol [Meperidine] Shortness Of Breath and Swelling  . Oxycodone Other (See Comments)    Hallucinations   . Adhesive [Tape] Other (See Comments)    Redness and skin tears  . Ivp Dye [Iodinated Diagnostic Agents] Hives    OK with benadryl pre-med (57m one hour before receiving iodinated contrast agent)  . Phenergan [Promethazine] Other (See Comments)    Avoids due to reaction with current medications    CURRENT MEDICATIONS:  Outpatient Encounter Medications as of 11/21/2018  Medication Sig  . acetaminophen (TYLENOL) 500 MG tablet Take 1,000 mg by mouth every 6 (six) hours as needed (head pain).   . calcium carbonate (TUMS - DOSED IN MG ELEMENTAL CALCIUM) 500 MG chewable tablet Chew 1 tablet by mouth daily.  . calcium citrate-vitamin D  (CITRACAL+D) 315-200 MG-UNIT per tablet Take 1 tablet by mouth 3 (three) times daily.  . carbidopa-levodopa (SINEMET IR) 25-100 MG tablet TAKE 2 TABLETS BY MOUTH THREE TIMES DAILY  . Cholecalciferol (VITAMIN D3) 2000 units TABS Take 1 capsule by mouth daily.  . famotidine-calcium carbonate-magnesium hydroxide (PEPCID COMPLETE) 10-800-165 MG CHEW chewable tablet Chew 1 tablet by mouth daily as needed (heartburn).  .Marland KitchenKLOR-CON M20 20 MEQ tablet Take 20 mEq by mouth daily.  .Marland KitchenKRILL OIL PO Take by mouth.  . Melatonin 3 MG TABS Take by mouth.  . Multiple Vitamin (MULTIVITAMIN) tablet Take 1 tablet by mouth daily at 12 noon.   . Multiple Vitamins-Minerals (VISION-VITE PRESERVE PO) Take 1 tablet by mouth 2 (two) times daily.  . pantoprazole (PROTONIX) 40 MG tablet Take 40 mg by mouth daily.  . Probiotic Product (PROBIOTIC-10 PO) Take by mouth.  . simvastatin (ZOCOR) 20 MG tablet Take 20 mg by mouth every evening.  . triamterene-hydrochlorothiazide (MAXZIDE-25) 37.5-25 MG per tablet Take 1 tablet by mouth every morning.   . vitamin B-12 (CYANOCOBALAMIN) 1000 MCG tablet Take 1,000 mcg by mouth every morning.  . warfarin (COUMADIN) 5 MG tablet Take 5 mg by mouth. Take 7.5 mg by mouth every Monday, Wednesday, Friday. Take 5 mg by mouth all other days.   No facility-administered encounter medications on file as of 11/21/2018.     PAST MEDICAL HISTORY:   Past Medical History:  Diagnosis Date  . Anticoagulated on Coumadin   . Arthropathy of cervical facet joint    C2-3  . At risk for falls   . BPV (benign positional vertigo)   . Bruises easily   . [place]    breast-lumpectomy,nodes ,radiation,chemo  . Chronic neck pain   . CKD (chronic kidney disease), stage II   . Diverticulosis of colon   . Elevated troponin    a. 08/2016 in setting of PAF;  b.  08/2016 low risk MV w/ fixed septal defect (LBBB), EF 53%.  Marland Kitchen GERD (gastroesophageal reflux disease)   . Hiatal hernia   . History of aortic  valve stenosis   . History of breast cancer per pt no recurrence   dx 2004-- Left breast DCIS (ER/PR+, HER2 negative) s/p  partial masectomy w/ sln bx and  chemoradiotion (completed 2004)  . History of colon polyps   . History of herpes zoster   . History of kidney stones   . History of kidney stones   . HTN (hypertension)   . Hyperlipidemia   . Hypertrophy of inter-atrial septum    a. 09/2014 Echo: ? of atrial mass; b. 10/2014 Cardiac MRI: no atrial septal mass - polypoid lipomatous hypertrophy of superior atrial septum noted-->Benign.  . Irritable bowel syndrome   . LBBB (left bundle branch block)   . PAF (paroxysmal atrial fibrillation) (HCC)    a. CHA2DS2VASc = 4-->coumadin.  . Parkinsonism [facility])    neurologist-  dr Makar Slatter  . Personal history of chemotherapy 2004  . Personal history of radiation therapy 2004  . PONV (postoperative nausea and vomiting)   . PVC's (premature ventricular contractions)   . Right ureteral stone   . S/P aortic valve replacement with prosthetic valve    a. 04/13/2006 s/p St Jude mechnical AVR for severe AS;  b. 09/2014 Echo: EF 40-45%, gr1 DD, mild AI/MR, triv TR/PR.  . SUI (stress urinary incontinence, female)   . Unstable balance    uses cane  . Venous varices      PAST SURGICAL HISTORY:   Past Surgical History:  Procedure Laterality Date  . ANTERIOR AND POSTERIOR REPAIR  1990   cystocele/ rectocele  . AORTIC VALVE REPLACEMENT  04/13/06   dr gerhardt   and Replacement Ascending Aorta (St. Jude mechanical prosthesis)  . APPENDECTOMY  child 1950  . BREAST LUMPECTOMY Left 01/11/2003   malignant  . BREAST SURGERY Left 2004  . CARDIAC CATHETERIZATION  02-22-2001   dr Martinique   minimal non-obstructive cad/  mild aortic stenosis and aortic root enlargement/  normal LVF, ef 65%  . CARDIAC CATHETERIZATION  03-22-2006    dr Martinique   no significant obstructive cad/  severe aortic stenosis and mild to moderate aortic root enlargement/  upper normal right  heart pressures  . CARDIOVASCULAR STRESS TEST  09-30-2011   dr Martinique   lexiscan nuclear study w/ apical thinning  vs  small prior apical infarct w/ no significant ischemia/  hypokinesis of the distal septum and apex, ef 50%  . CARPAL TUNNEL RELEASE Bilateral left 09-09-2004/  right 2007  . CATARACT EXTRACTION W/ INTRAOCULAR LENS  IMPLANT, BILATERAL  2015  . COLONOSCOPY N/A 10/03/2014   Procedure: COLONOSCOPY;  Surgeon: [name], MD;  Location: [place];  Service: Endoscopy;  Laterality: N/A;  . CYSTOSCOPY WITH RETROGRADE PYELOGRAM, URETEROSCOPY AND STENT PLACEMENT Right 09/17/2016   Procedure: CYSTOSCOPY WITH RIGHT RETROGRADE PYELOGRAM, RIGHT URETEROSCOPY AND STENT PLACEMENT LASER LITHOTRIPSY, RIGHT STENT PLACEMENT;  Surgeon: [name], MD;  Location: [facility];  Service: Urology;  Laterality: Right;  . ESOPHAGEAL MANOMETRY N/A 08/12/2014   Procedure: ESOPHAGEAL MANOMETRY (EM);  Surgeon: [name], MD;  Location: [facility];  Service: Endoscopy;  Laterality: N/A;  . ESOPHAGOGASTRODUODENOSCOPY N/A 10/03/2014   Procedure: ESOPHAGOGASTRODUODENOSCOPY (EGD);  Surgeon: [name], MD;  Location: [facility];  Service: Endoscopy;  Laterality: N/A;  . Gas  . FOOT SURGERY  Right 2013   hammertoe x2  . HOLMIUM LASER APPLICATION Right 3/81/0175   Procedure: HOLMIUM LASER APPLICATION;  Surgeon: [name], MD;  Location: [facility];  Service: Urology;  Laterality: Right;  . PERCUTANEOUS NEPHROSTOLITHOTOMY  1993  . STONE EXTRACTION WITH BASKET Right 09/17/2016   Procedure: STONE EXTRACTION WITH BASKET;  Surgeon: [name], MD;  Location: [facility];  Service: Urology;  Laterality: Right;  . TOTAL ABDOMINAL HYSTERECTOMY  1971   w/ Bilateral Salpingoophorectomy  . TRANSTHORACIC ECHOCARDIOGRAM  10/24/2014   dr Martinique   hypokinesis of the basal and mid inferoseptal and inferior  walls,  ef 40-45%,  grade 1 diastolic dysfunction/  mechanical bileaflet aortic valve noted w/ mild regur., peak grandient 30mHg, valve area 1.35cm^2/  severe MV calcification without stenosis w/ mild regurg., peak gradient 310mg/  mild LAE/  poss. atrial mass (MRI showed benign lipomatous hypertrophy of atrial septum) /tFrazier ButtR/mild TR    SOCIAL HISTORY:   Social History   Socioeconomic History  . Marital status: Married    Spouse name: Not on file  . Number of children: 3  . Years of education: 10  . Highest education level: Not on file  Occupational History  . 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 Financial resource strain: Not on file  . Food insecurity:    Worry: Not on file    Inability: Not on file  . Transportation needs:    Medical: Not on file    Non-medical: Not on file  Tobacco Use  . Smoking status: Never Smoker  . Smokeless tobacco: Never Used  Substance and Sexual Activity  . Alcohol use: No    Alcohol/week: 0.0 standard drinks  . Drug use: No  . Sexual activity: Not on file  Lifestyle  . Physical activity:    Days per week: Not on file    Minutes per session: Not on file  . Stress: Not on file  Relationships  . Social connections:    Talks on phone: Not on file    Gets together: Not on file    Attends religious service: Not on file    Active member of club or organization: Not on file    Attends meetings of clubs or organizations: Not on file    Relationship status: Not on file  . Intimate partner violence:    Fear of current or ex partner: Not on file    Emotionally abused: Not on file    Physically abused: Not on file    Forced sexual activity: Not on file  Other Topics Concern  . Not on file  Social History Narrative      1/2 -1 soda a day      FAMILY HISTORY:   Family Status  Relation Name Status  . Mother  Deceased at age 78. Father  Deceased at age 78. Brother  Deceased       colon  cancer  . Brother  Deceased       accident  . Brother  Deceased       accident  . Brother  Alive       colon cancer  . Brother  Other  . Sister  Alive  . Sister  Alive  . Sister  Alive  . MGM  Deceased  . MGF  Deceased  . PGM  Deceased  . PGF  Deceased  .  Brother  (Not Specified)    ROS:  ROS  PHYSICAL EXAMINATION:    VITALS:   Vitals:   11/21/18 0800  BP: 124/84  Pulse: 72  Weight: 138 lb (62.6 kg)  Height: 4' 11.75" (1.518 m)    GEN:  The patient appears stated age and is in NAD. HEENT:  Normocephalic, atraumatic.  The mucous membranes are moist. The superficial temporal arteries are without ropiness or tenderness. CV:  RRR Lungs:  CTAB Neck/HEME:  There are no carotid bruits bilaterally.  Neck is turned to the left (same as previous)  Neurological examination:  Orientation: The patient is alert and oriented x3.  Cranial nerves: There is mild R facial droop at rest but able to activate the muscles well.  There is facial hypomimia.   There is occasional L hemifacial spasm. Extraocular muscles are intact. No square wave jerks.  The visual fields are full to confrontational testing. The speech is fluent and clear. She is hypophonic.  Minimal trouble with gutteral sounds.  Soft palate rises symmetrically and there is no tongue deviation. Hearing is intact to conversational tone. Sensation: Sensation is intact to light touch throughout. Motor: Strength is 5/5 in the bilateral upper and lower extremities.   Shoulder shrug is equal and symmetric.  There is no pronator drift.   Movement examination: Tone: There is normal tone in the bilateral upper extremities.  The tone in the lower extremities is normal.  Abnormal movements: no tremor today.   Coordination:  There is decremation, with any form of RAMS, including alternating supination and pronation of the forearm, hand opening and closing, finger taps, heel taps and toe taps, more in the LE than UE Gait and Station: The  patient pushes off of the chair. The patient is shuffling.  She has trouble in the turns.  She has a cane.  ASSESSMENT/PLAN:  1.  Probable PSP  -I do not think that the patient has idiopathic Parkinsons Disease but rather one of the atypical parkinsonian states, and possibly Progressive Supranuclear Palsy (PSP).  We talked about nature, etiology and pathophysiology. We talked about how the symptoms, course and prognosis differ from Parkinson's Disease.  We talked about the risks, particularly for falls and aspiration.   -continue carbidopa/levodopa 25/100, 2 po tid  -riding bike for exercise 2 times per week  -told her to get rid of cane  -use walker at all times.  Trouble especially when turning.    -think PT would be of value but refuses, as did last time  -talked to her about advanced directives.   Discussed again as they didn't do this when discussed last time.  Will send forms 2.  Neck pain with cervical dystonia  -I think this is multifactorial.  She did have an MRI of the cervical spine on 06/03/2016 that demonstrated severe C2-C3 left facet arthropathy with joint effusion and bone marrow edema.  She has received injections for this.  She also has evidence of cervical dystonia, although this surprisingly has improved some with levodopa. Neck pain is better 3.  cough  -Last modified barium swallow was in June, 2019.  This was normal.    -likely related to reflux as she has a globus sensation.  On Pepcid and PPI. Told to f/u with prescribing physician. 4.  Follow up is anticipated in the next few months, sooner should new neurologic issues arise.  Much greater than 50% of this visit was spent in counseling and coordinating care.  Total face to face  time:  25 min

## 2018-11-21 ENCOUNTER — Encounter: Payer: Self-pay | Admitting: Psychology

## 2018-11-21 ENCOUNTER — Encounter: Payer: Self-pay | Admitting: Neurology

## 2018-11-21 ENCOUNTER — Ambulatory Visit (INDEPENDENT_AMBULATORY_CARE_PROVIDER_SITE_OTHER): Payer: PPO | Admitting: Neurology

## 2018-11-21 VITALS — BP 124/84 | HR 72 | Ht 59.75 in | Wt 138.0 lb

## 2018-11-21 DIAGNOSIS — G243 Spasmodic torticollis: Secondary | ICD-10-CM

## 2018-11-21 DIAGNOSIS — G231 Progressive supranuclear ophthalmoplegia [Steele-Richardson-Olszewski]: Secondary | ICD-10-CM

## 2018-11-21 NOTE — Progress Notes (Signed)
As requested from Dr. Carles Collet a packet on advanced directives with instructions was put in the mail.  I included my contact information and encouraged them to contact me if they have any questions or need help with completing.

## 2018-11-27 DIAGNOSIS — M25512 Pain in left shoulder: Secondary | ICD-10-CM | POA: Diagnosis not present

## 2018-11-27 DIAGNOSIS — W0110XA Fall on same level from slipping, tripping and stumbling with subsequent striking against unspecified object, initial encounter: Secondary | ICD-10-CM | POA: Diagnosis not present

## 2018-11-28 DIAGNOSIS — J3489 Other specified disorders of nose and nasal sinuses: Secondary | ICD-10-CM | POA: Diagnosis not present

## 2018-12-04 DIAGNOSIS — M25512 Pain in left shoulder: Secondary | ICD-10-CM | POA: Insufficient documentation

## 2018-12-04 DIAGNOSIS — Z7901 Long term (current) use of anticoagulants: Secondary | ICD-10-CM | POA: Diagnosis not present

## 2018-12-05 ENCOUNTER — Encounter (HOSPITAL_COMMUNITY): Payer: Self-pay | Admitting: Emergency Medicine

## 2018-12-05 ENCOUNTER — Inpatient Hospital Stay (HOSPITAL_COMMUNITY): Payer: PPO

## 2018-12-05 ENCOUNTER — Emergency Department (HOSPITAL_COMMUNITY): Payer: PPO

## 2018-12-05 ENCOUNTER — Other Ambulatory Visit: Payer: Self-pay

## 2018-12-05 ENCOUNTER — Inpatient Hospital Stay (HOSPITAL_COMMUNITY)
Admission: EM | Admit: 2018-12-05 | Discharge: 2018-12-09 | DRG: 377 | Disposition: A | Discharge status: Home Health | Payer: PPO | Attending: Internal Medicine | Admitting: Internal Medicine

## 2018-12-05 DIAGNOSIS — T501X5A Adverse effect of loop [high-ceiling] diuretics, initial encounter: Secondary | ICD-10-CM | POA: Diagnosis not present

## 2018-12-05 DIAGNOSIS — E876 Hypokalemia: Secondary | ICD-10-CM | POA: Diagnosis not present

## 2018-12-05 DIAGNOSIS — I10 Essential (primary) hypertension: Secondary | ICD-10-CM | POA: Diagnosis present

## 2018-12-05 DIAGNOSIS — Z961 Presence of intraocular lens: Secondary | ICD-10-CM | POA: Diagnosis present

## 2018-12-05 DIAGNOSIS — Z923 Personal history of irradiation: Secondary | ICD-10-CM

## 2018-12-05 DIAGNOSIS — J9601 Acute respiratory failure with hypoxia: Secondary | ICD-10-CM | POA: Diagnosis not present

## 2018-12-05 DIAGNOSIS — I5043 Acute on chronic combined systolic (congestive) and diastolic (congestive) heart failure: Secondary | ICD-10-CM | POA: Diagnosis present

## 2018-12-05 DIAGNOSIS — R7989 Other specified abnormal findings of blood chemistry: Secondary | ICD-10-CM | POA: Diagnosis not present

## 2018-12-05 DIAGNOSIS — D539 Nutritional anemia, unspecified: Secondary | ICD-10-CM | POA: Diagnosis not present

## 2018-12-05 DIAGNOSIS — I130 Hypertensive heart and chronic kidney disease with heart failure and stage 1 through stage 4 chronic kidney disease, or unspecified chronic kidney disease: Secondary | ICD-10-CM | POA: Diagnosis not present

## 2018-12-05 DIAGNOSIS — N200 Calculus of kidney: Secondary | ICD-10-CM | POA: Diagnosis not present

## 2018-12-05 DIAGNOSIS — K922 Gastrointestinal hemorrhage, unspecified: Secondary | ICD-10-CM | POA: Diagnosis not present

## 2018-12-05 DIAGNOSIS — Z90722 Acquired absence of ovaries, bilateral: Secondary | ICD-10-CM

## 2018-12-05 DIAGNOSIS — R195 Other fecal abnormalities: Secondary | ICD-10-CM

## 2018-12-05 DIAGNOSIS — I447 Left bundle-branch block, unspecified: Secondary | ICD-10-CM | POA: Diagnosis present

## 2018-12-05 DIAGNOSIS — Z885 Allergy status to narcotic agent status: Secondary | ICD-10-CM

## 2018-12-05 DIAGNOSIS — I471 Supraventricular tachycardia: Secondary | ICD-10-CM | POA: Diagnosis not present

## 2018-12-05 DIAGNOSIS — R0902 Hypoxemia: Secondary | ICD-10-CM | POA: Diagnosis not present

## 2018-12-05 DIAGNOSIS — Z86010 Personal history of colonic polyps: Secondary | ICD-10-CM

## 2018-12-05 DIAGNOSIS — R5381 Other malaise: Secondary | ICD-10-CM | POA: Diagnosis present

## 2018-12-05 DIAGNOSIS — N184 Chronic kidney disease, stage 4 (severe): Secondary | ICD-10-CM | POA: Diagnosis not present

## 2018-12-05 DIAGNOSIS — R0602 Shortness of breath: Secondary | ICD-10-CM | POA: Diagnosis present

## 2018-12-05 DIAGNOSIS — N179 Acute kidney failure, unspecified: Secondary | ICD-10-CM | POA: Diagnosis not present

## 2018-12-05 DIAGNOSIS — J9621 Acute and chronic respiratory failure with hypoxia: Secondary | ICD-10-CM | POA: Diagnosis not present

## 2018-12-05 DIAGNOSIS — Z952 Presence of prosthetic heart valve: Secondary | ICD-10-CM

## 2018-12-05 DIAGNOSIS — K449 Diaphragmatic hernia without obstruction or gangrene: Secondary | ICD-10-CM | POA: Diagnosis not present

## 2018-12-05 DIAGNOSIS — Z79899 Other long term (current) drug therapy: Secondary | ICD-10-CM

## 2018-12-05 DIAGNOSIS — Z8719 Personal history of other diseases of the digestive system: Secondary | ICD-10-CM

## 2018-12-05 DIAGNOSIS — G8929 Other chronic pain: Secondary | ICD-10-CM | POA: Diagnosis present

## 2018-12-05 DIAGNOSIS — G20C Parkinsonism, unspecified: Secondary | ICD-10-CM | POA: Diagnosis present

## 2018-12-05 DIAGNOSIS — E785 Hyperlipidemia, unspecified: Secondary | ICD-10-CM | POA: Diagnosis not present

## 2018-12-05 DIAGNOSIS — R079 Chest pain, unspecified: Secondary | ICD-10-CM | POA: Diagnosis not present

## 2018-12-05 DIAGNOSIS — S20212A Contusion of left front wall of thorax, initial encounter: Secondary | ICD-10-CM | POA: Diagnosis not present

## 2018-12-05 DIAGNOSIS — R000 Tachycardia, unspecified: Secondary | ICD-10-CM

## 2018-12-05 DIAGNOSIS — I480 Paroxysmal atrial fibrillation: Secondary | ICD-10-CM | POA: Diagnosis not present

## 2018-12-05 DIAGNOSIS — R791 Abnormal coagulation profile: Secondary | ICD-10-CM | POA: Diagnosis not present

## 2018-12-05 DIAGNOSIS — Z888 Allergy status to other drugs, medicaments and biological substances status: Secondary | ICD-10-CM

## 2018-12-05 DIAGNOSIS — N183 Chronic kidney disease, stage 3 unspecified: Secondary | ICD-10-CM | POA: Diagnosis present

## 2018-12-05 DIAGNOSIS — D649 Anemia, unspecified: Secondary | ICD-10-CM | POA: Diagnosis not present

## 2018-12-05 DIAGNOSIS — W0110XA Fall on same level from slipping, tripping and stumbling with subsequent striking against unspecified object, initial encounter: Secondary | ICD-10-CM | POA: Diagnosis present

## 2018-12-05 DIAGNOSIS — J811 Chronic pulmonary edema: Secondary | ICD-10-CM | POA: Diagnosis not present

## 2018-12-05 DIAGNOSIS — I483 Typical atrial flutter: Secondary | ICD-10-CM | POA: Diagnosis not present

## 2018-12-05 DIAGNOSIS — I4892 Unspecified atrial flutter: Secondary | ICD-10-CM | POA: Diagnosis not present

## 2018-12-05 DIAGNOSIS — Z9181 History of falling: Secondary | ICD-10-CM

## 2018-12-05 DIAGNOSIS — G20 Parkinson's disease: Secondary | ICD-10-CM | POA: Diagnosis not present

## 2018-12-05 DIAGNOSIS — Z853 Personal history of malignant neoplasm of breast: Secondary | ICD-10-CM

## 2018-12-05 DIAGNOSIS — Z7901 Long term (current) use of anticoagulants: Secondary | ICD-10-CM

## 2018-12-05 DIAGNOSIS — E7800 Pure hypercholesterolemia, unspecified: Secondary | ICD-10-CM | POA: Diagnosis present

## 2018-12-05 DIAGNOSIS — N393 Stress incontinence (female) (male): Secondary | ICD-10-CM | POA: Diagnosis present

## 2018-12-05 DIAGNOSIS — K254 Chronic or unspecified gastric ulcer with hemorrhage: Secondary | ICD-10-CM | POA: Diagnosis not present

## 2018-12-05 DIAGNOSIS — Z87442 Personal history of urinary calculi: Secondary | ICD-10-CM

## 2018-12-05 DIAGNOSIS — I4891 Unspecified atrial fibrillation: Secondary | ICD-10-CM | POA: Diagnosis present

## 2018-12-05 DIAGNOSIS — K589 Irritable bowel syndrome without diarrhea: Secondary | ICD-10-CM | POA: Diagnosis present

## 2018-12-05 DIAGNOSIS — D62 Acute posthemorrhagic anemia: Secondary | ICD-10-CM | POA: Diagnosis not present

## 2018-12-05 DIAGNOSIS — Z9221 Personal history of antineoplastic chemotherapy: Secondary | ICD-10-CM

## 2018-12-05 DIAGNOSIS — Z91048 Other nonmedicinal substance allergy status: Secondary | ICD-10-CM

## 2018-12-05 DIAGNOSIS — K921 Melena: Secondary | ICD-10-CM | POA: Diagnosis not present

## 2018-12-05 DIAGNOSIS — S40022A Contusion of left upper arm, initial encounter: Secondary | ICD-10-CM | POA: Diagnosis not present

## 2018-12-05 DIAGNOSIS — K219 Gastro-esophageal reflux disease without esophagitis: Secondary | ICD-10-CM | POA: Diagnosis not present

## 2018-12-05 DIAGNOSIS — R911 Solitary pulmonary nodule: Secondary | ICD-10-CM | POA: Diagnosis not present

## 2018-12-05 DIAGNOSIS — K3189 Other diseases of stomach and duodenum: Secondary | ICD-10-CM | POA: Diagnosis not present

## 2018-12-05 DIAGNOSIS — Z9841 Cataract extraction status, right eye: Secondary | ICD-10-CM

## 2018-12-05 DIAGNOSIS — Z9842 Cataract extraction status, left eye: Secondary | ICD-10-CM

## 2018-12-05 DIAGNOSIS — Z90710 Acquired absence of both cervix and uterus: Secondary | ICD-10-CM

## 2018-12-05 DIAGNOSIS — Z8249 Family history of ischemic heart disease and other diseases of the circulatory system: Secondary | ICD-10-CM

## 2018-12-05 DIAGNOSIS — Z91041 Radiographic dye allergy status: Secondary | ICD-10-CM

## 2018-12-05 DIAGNOSIS — Z9079 Acquired absence of other genital organ(s): Secondary | ICD-10-CM

## 2018-12-05 DIAGNOSIS — Z800 Family history of malignant neoplasm of digestive organs: Secondary | ICD-10-CM

## 2018-12-05 DIAGNOSIS — R0789 Other chest pain: Secondary | ICD-10-CM | POA: Diagnosis not present

## 2018-12-05 DIAGNOSIS — I213 ST elevation (STEMI) myocardial infarction of unspecified site: Secondary | ICD-10-CM | POA: Diagnosis not present

## 2018-12-05 HISTORY — DX: Anemia, unspecified: D64.9

## 2018-12-05 LAB — CBC WITH DIFFERENTIAL/PLATELET
Abs Immature Granulocytes: 0.03 10*3/uL (ref 0.00–0.07)
Basophils Absolute: 0.1 10*3/uL (ref 0.0–0.1)
Basophils Relative: 1 %
Eosinophils Absolute: 0.1 10*3/uL (ref 0.0–0.5)
Eosinophils Relative: 2 %
HCT: 18.5 % — ABNORMAL LOW (ref 36.0–46.0)
Hemoglobin: 5.6 g/dL — CL (ref 12.0–15.0)
Immature Granulocytes: 0 %
Lymphocytes Relative: 20 %
Lymphs Abs: 1.7 10*3/uL (ref 0.7–4.0)
MCH: 32.6 pg (ref 26.0–34.0)
MCHC: 30.3 g/dL (ref 30.0–36.0)
MCV: 107.6 fL — ABNORMAL HIGH (ref 80.0–100.0)
Monocytes Absolute: 0.7 10*3/uL (ref 0.1–1.0)
Monocytes Relative: 9 %
Neutro Abs: 5.7 10*3/uL (ref 1.7–7.7)
Neutrophils Relative %: 68 %
Platelets: 226 10*3/uL (ref 150–400)
RBC: 1.72 MIL/uL — ABNORMAL LOW (ref 3.87–5.11)
RDW: 15 % (ref 11.5–15.5)
WBC: 8.3 10*3/uL (ref 4.0–10.5)
nRBC: 0 % (ref 0.0–0.2)

## 2018-12-05 LAB — BASIC METABOLIC PANEL
Anion gap: 11 (ref 5–15)
BUN: 39 mg/dL — ABNORMAL HIGH (ref 8–23)
CO2: 27 mmol/L (ref 22–32)
Calcium: 9.1 mg/dL (ref 8.9–10.3)
Chloride: 97 mmol/L — ABNORMAL LOW (ref 98–111)
Creatinine, Ser: 1.83 mg/dL — ABNORMAL HIGH (ref 0.44–1.00)
Glucose, Bld: 110 mg/dL — ABNORMAL HIGH (ref 70–99)
Potassium: 3.8 mmol/L (ref 3.5–5.1)
Sodium: 135 mmol/L (ref 135–145)

## 2018-12-05 LAB — CBC
HCT: 29.3 % — ABNORMAL LOW (ref 36.0–46.0)
Hemoglobin: 9.4 g/dL — ABNORMAL LOW (ref 12.0–15.0)
MCH: 30.9 pg (ref 26.0–34.0)
MCHC: 32.1 g/dL (ref 30.0–36.0)
MCV: 96.4 fL (ref 80.0–100.0)
Platelets: 221 10*3/uL (ref 150–400)
RBC: 3.04 MIL/uL — ABNORMAL LOW (ref 3.87–5.11)
RDW: 18.7 % — ABNORMAL HIGH (ref 11.5–15.5)
WBC: 8 10*3/uL (ref 4.0–10.5)
nRBC: 0.4 % — ABNORMAL HIGH (ref 0.0–0.2)

## 2018-12-05 LAB — I-STAT TROPONIN, ED: Troponin i, poc: 0.05 ng/mL (ref 0.00–0.08)

## 2018-12-05 LAB — PROTIME-INR
INR: 3.45
INR: 2.75
Prothrombin Time: 34.2 seconds — ABNORMAL HIGH (ref 11.4–15.2)
Prothrombin Time: 28.7 seconds — ABNORMAL HIGH (ref 11.4–15.2)

## 2018-12-05 LAB — D-DIMER, QUANTITATIVE: D-Dimer, Quant: 3.9 ug/mL-FEU — ABNORMAL HIGH (ref 0.00–0.50)

## 2018-12-05 LAB — PREPARE RBC (CROSSMATCH)

## 2018-12-05 LAB — TSH: TSH: 1.304 u[IU]/mL (ref 0.350–4.500)

## 2018-12-05 LAB — TROPONIN I: Troponin I: 0.07 ng/mL (ref <0.03)

## 2018-12-05 LAB — MRSA PCR SCREENING: MRSA by PCR: NEGATIVE

## 2018-12-05 LAB — POC OCCULT BLOOD, ED: Fecal Occult Bld: POSITIVE — AB

## 2018-12-05 LAB — MAGNESIUM: Magnesium: 1.7 mg/dL (ref 1.7–2.4)

## 2018-12-05 MED ORDER — ONDANSETRON HCL 4 MG/2ML IJ SOLN
4.0000 mg | Freq: Four times a day (QID) | INTRAMUSCULAR | Status: DC | PRN
  Administered 2018-12-05 – 2018-12-08 (×2): 4 mg via INTRAVENOUS
  Filled 2018-12-05: qty 2

## 2018-12-05 MED ORDER — ACETAMINOPHEN 325 MG PO TABS
650.0000 mg | ORAL_TABLET | ORAL | Status: DC | PRN
  Administered 2018-12-06 – 2018-12-09 (×4): 650 mg via ORAL
  Filled 2018-12-05 (×4): qty 2

## 2018-12-05 MED ORDER — PANTOPRAZOLE SODIUM 40 MG IV SOLR
40.0000 mg | Freq: Two times a day (BID) | INTRAVENOUS | Status: DC
  Administered 2018-12-05 – 2018-12-08 (×7): 40 mg via INTRAVENOUS
  Filled 2018-12-05 (×7): qty 40

## 2018-12-05 MED ORDER — SODIUM CHLORIDE 0.9 % IV BOLUS
1000.0000 mL | Freq: Once | INTRAVENOUS | Status: AC
  Administered 2018-12-05: 1000 mL via INTRAVENOUS

## 2018-12-05 MED ORDER — SODIUM CHLORIDE 0.9% IV SOLUTION
Freq: Once | INTRAVENOUS | Status: AC
  Administered 2018-12-05: 10:00:00 via INTRAVENOUS

## 2018-12-05 MED ORDER — FUROSEMIDE 10 MG/ML IJ SOLN
40.0000 mg | Freq: Once | INTRAMUSCULAR | Status: AC
  Administered 2018-12-05: 40 mg via INTRAVENOUS
  Filled 2018-12-05: qty 4

## 2018-12-05 MED ORDER — DILTIAZEM HCL-DEXTROSE 100-5 MG/100ML-% IV SOLN (PREMIX)
5.0000 mg/h | INTRAVENOUS | Status: DC
  Filled 2018-12-05: qty 100

## 2018-12-05 MED ORDER — CARBIDOPA-LEVODOPA 25-100 MG PO TABS
2.0000 | ORAL_TABLET | Freq: Three times a day (TID) | ORAL | Status: DC
  Administered 2018-12-05 – 2018-12-09 (×12): 2 via ORAL
  Filled 2018-12-05 (×12): qty 2

## 2018-12-05 MED ORDER — FUROSEMIDE 10 MG/ML IJ SOLN
INTRAMUSCULAR | Status: AC
  Filled 2018-12-05: qty 4

## 2018-12-05 MED ORDER — FUROSEMIDE 10 MG/ML IJ SOLN
20.0000 mg | Freq: Once | INTRAMUSCULAR | Status: AC
  Administered 2018-12-05: 20 mg via INTRAVENOUS

## 2018-12-05 MED ORDER — DILTIAZEM LOAD VIA INFUSION
10.0000 mg | Freq: Once | INTRAVENOUS | Status: DC
  Filled 2018-12-05: qty 10

## 2018-12-05 MED ORDER — SODIUM CHLORIDE 0.9% IV SOLUTION
Freq: Once | INTRAVENOUS | Status: AC
  Administered 2018-12-05: 13:00:00 via INTRAVENOUS

## 2018-12-05 NOTE — Progress Notes (Signed)
Called to patients room patient just back from CT having issues with wob also has PE and excess fluid.  Rapid response called I placed patient on BIPAP after assessment completed pt immediately decreased wob from 40's to 21 after being placed on BIPAP... explained to family and patient what it consist of and what to expect as well as possible duration.  RT will continue to monitor patient and wean as tolerated.  Oral care was done by RN before being placed on BIPAP.

## 2018-12-05 NOTE — Progress Notes (Signed)
Patient arrived to room in NAD, VS stable. Admission nurse paged and family at bedside.

## 2018-12-05 NOTE — Progress Notes (Signed)
Attempted to get report from ED RN 

## 2018-12-05 NOTE — Significant Event (Signed)
Rapid Response Event Note  Overview: Respiratory Distress  Initial Focused Assessment: Called by RN about patient's experiencing shortness of breath and increased work of breathing, per RN, RR in the 2s. RT was at the bedside when I arrived. Oxygen saturations were greater than 96% on 2L [place]. Patient was in moderate distress, only able to speak 1-2 words, + SOB, + WOB, + use of accessory muscles, lungs sounds - coarse crackles - diminished overall. Skin was warm and dry, extensive bruising on LUE/L chest region (s/p fall). Required 2 units of PRBCs for low HB earlier today and received a dose of lasix around noon per RN. Patient is alert and oriented, denies pain. HR 80- AFlutter (has AF baseline), SBP 120s, MAP > 65, RR in the mid-upper 40s. Per patient, family, and nurse SOB has been ongoing all day but worsened and coupled with increase WOB since patient returned from CT.   I paged [facility] NP and we discussed the patient was 2115.  Interventions: - CT CHEST/ABD/P reviewed with [facility] NP - CHF/bilaterl pleural effusions/ pulmonary edema. - BIPAP - 60% 12/5 - tolerating very well  - Lasix 40 mg IV - Within 2-3 minutes on the BIPAP, patient RR was in the 16-20 range, patient was breathing more comfortably and able to rest.   Plan of Care: - Keep patient on BIPAP for at least a few hours as patient tolerates - Monitor VS and I/O - RN to follow up with [facility] NP as needed  Event Summary:    at    [facility] Time 2056 Arrival Time 2058 End Time 2140  [place], [place]

## 2018-12-05 NOTE — ED Notes (Signed)
Obtained consent for blood transfusion

## 2018-12-05 NOTE — ED Notes (Signed)
CRITICAL VALUE ALERT  Critical Value:  Hgb 5.6  Date & Time Notied:  12/05/2018 0818  Provider Notified: Dr. Manya Silvas PA

## 2018-12-05 NOTE — ED Provider Notes (Signed)
[facility] EMERGENCY DEPARTMENT Provider Note   CSN: [id number] Arrival date & time: 12/05/18  2585     History   Chief Complaint Chief Complaint  Patient presents with  . Chest Pain    HPI [name] is a 77 y.o. female.  HPI   [name] is a 77 y.o. female, with a history of GERD, aortic valve replacement in 2007, paroxysmal A. fib, left bundle branch block, hyperlipidemia, HTN, CKD stage II, presenting to the ED with chest pain. Present upon waking around 3 am. Pain is central chest, pressure "like someone pushing on my chest," originally 8/10, now 7/10, nonradiating. Received 2x NTG and 358m ASA via EMS.  Adds that she feels as though she has been going in and out of A. fib for the last 2 days with palpitations, but has not had this type of pain before.  Accompanied by shortness of breath.  Sustained a fall one week ago. Assessed by PCP with xrays of chest and left arm, no acute findings, per patient. Bruising continued in left arm and chest, reassessed with xrays yesterday, also without acute findings. INR noted to be 4.3, patient told to hold evening coumadin.  Cardiologist: Peter JMartiniqueDenies fever, syncope, dizziness, diaphoresis, N/V/D, abdominal pain, hip pain, neuro deficits, urinary symptoms, lower extremity edema or pain, or any other complaints.   Past Medical History:  Diagnosis Date  . Arthropathy of cervical facet joint    C2-3  . At risk for falls   . BPV (benign positional vertigo)   . [place]    breast-lumpectomy,nodes ,radiation,chemo  . Chronic neck pain   . CKD (chronic kidney disease), stage II   . Diverticulosis of colon   . Elevated troponin    a. 08/2016 in setting of PAF;  b. 08/2016 low risk MV w/ fixed septal defect (LBBB), EF 53%.  .Marland KitchenGERD (gastroesophageal reflux disease)   . Hiatal hernia   . History of aortic valve stenosis   . History of breast cancer per pt no recurrence   dx 2004--  Left breast DCIS (ER/PR+, HER2 negative) s/p  partial masectomy w/ sln bx and  chemoradiotion (completed 2004)  . History of colon polyps   . History of herpes zoster   . History of kidney stones   . HTN (hypertension)   . Hyperlipidemia   . Hypertrophy of inter-atrial septum    a. 09/2014 Echo: ? of atrial mass; b. 10/2014 Cardiac MRI: no atrial septal mass - polypoid lipomatous hypertrophy of superior atrial septum noted-->Benign.  . Irritable bowel syndrome   . LBBB (left bundle branch block)   . PAF (paroxysmal atrial fibrillation) (HCC)    a. CHA2DS2VASc = 4-->coumadin.  . Parkinsonism ([facility]    neurologist-  dr tat  . Personal history of chemotherapy 2004  . Personal history of radiation therapy 2004  . PONV (postoperative nausea and vomiting)   . PVC's (premature ventricular contractions)   . Right ureteral stone   . S/P aortic valve replacement with prosthetic valve    a. 04/13/2006 s/p St Jude mechnical AVR for severe AS;  b. 09/2014 Echo: EF 40-45%, gr1 DD, mild AI/MR, triv TR/PR.  . SUI (stress urinary incontinence, female)   . Venous varices      Patient Active Problem List   Diagnosis Date Noted  . Elevated troponin 09/19/2016  . Emesis 09/19/2016  . UTI (urinary tract infection) 09/19/2016  . Sinus bradycardia 09/16/2016  . Family  history of colon cancer 07/30/2014  . Hiatal hernia 07/30/2014  . Chest pain 10/13/2011  . Disequilibrium 10/13/2011  . Heart valve replaced by other means 04/16/2011  . Chronic anticoagulation 03/17/2011  . S/P AVR (aortic valve replacement)   . PVC's (premature ventricular contractions)   . HTN (hypertension)   . Hypercholesteremia   . Atrial fibrillation with RVR ([place])   . GERD (gastroesophageal reflux disease)   . GERD 02/06/2010  . DIVERTICULOSIS-COLON 02/06/2010  . PERSONAL HX COLONIC POLYPS 02/06/2010  . MASTECTOMY, HX OF 02/06/2010    Past Surgical History:  Procedure Laterality Date  . ANTERIOR AND POSTERIOR REPAIR   1990   cystocele/ rectocele  . AORTIC VALVE REPLACEMENT  04/13/06   dr gerhardt   and Replacement Ascending Aorta (St. Jude mechanical prosthesis)  . APPENDECTOMY  child 1950  . BREAST LUMPECTOMY Left 01/11/2003   malignant  . BREAST SURGERY Left 2004  . CARDIAC CATHETERIZATION  02-22-2001   dr Martinique   minimal non-obstructive cad/  mild aortic stenosis and aortic root enlargement/  normal LVF, ef 65%  . CARDIAC CATHETERIZATION  03-22-2006    dr Martinique   no significant obstructive cad/  severe aortic stenosis and mild to moderate aortic root enlargement/  upper normal right heart pressures  . CARDIOVASCULAR STRESS TEST  09-30-2011   dr Martinique   lexiscan nuclear study w/ apical thinning  vs  small prior apical infarct w/ no significant ischemia/  hypokinesis of the distal septum and apex, ef 50%  . CARPAL TUNNEL RELEASE Bilateral left 09-09-2004/  right 2007  . CATARACT EXTRACTION W/ INTRAOCULAR LENS  IMPLANT, BILATERAL  2015  . COLONOSCOPY N/A 10/03/2014   Procedure: COLONOSCOPY;  Surgeon: [name], MD;  Location: [place];  Service: Endoscopy;  Laterality: N/A;  . CYSTOSCOPY WITH RETROGRADE PYELOGRAM, URETEROSCOPY AND STENT PLACEMENT Right 09/17/2016   Procedure: CYSTOSCOPY WITH RIGHT RETROGRADE PYELOGRAM, RIGHT URETEROSCOPY AND STENT PLACEMENT LASER LITHOTRIPSY, RIGHT STENT PLACEMENT;  Surgeon: [name], MD;  Location[facility];  Service: Urology;  Laterality: Right;  . ESOPHAGEAL MANOMETRY N/A 08/12/2014   Procedure: ESOPHAGEAL MANOMETRY (EM);  Surgeon: [name], MD;  Location: [facility];  Service: Endoscopy;  Laterality: N/A;  . ESOPHAGOGASTRODUODENOSCOPY N/A 10/03/2014   Procedure: ESOPHAGOGASTRODUODENOSCOPY (EGD);  Surgeon: [name], MD;  Location: [facility] ENDOSCOPY;  Service: Endoscopy;  Laterality: N/A;  . [place]  . FOOT SURGERY Right 2013   hammertoe x2  . HOLMIUM LASER APPLICATION Right 3/40/3709    Procedure: HOLMIUM LASER APPLICATION;  Surgeon: [name], MD;  [facility];  Service: Urology;  Laterality: Right;  . PERCUTANEOUS NEPHROSTOLITHOTOMY  1993  . STONE EXTRACTION WITH BASKET Right 09/17/2016   Procedure: STONE EXTRACTION WITH BASKET;  Surgeon: [name], MD;  Location: [facility];  Service: Urology;  Laterality: Right;  . TOTAL ABDOMINAL HYSTERECTOMY  1971   w/ Bilateral Salpingoophorectomy  . TRANSTHORACIC ECHOCARDIOGRAM  10/24/2014   dr Martinique   hypokinesis of the basal and mid inferoseptal and inferior walls,  ef 40-45%,  grade 1 diastolic dysfunction/  mechanical bileaflet aortic valve noted w/ mild regur., peak grandient 3mHg, valve area 1.35cm^2/  severe MV calcification without stenosis w/ mild regurg., peak gradient 381mg/  mild LAE/  poss. atrial mass (MRI showed benign lipomatous hypertrophy of atrial septum) /tFrazier ButtR/mild TR     OB History   None  Home Medications    Prior to Admission medications   Medication Sig Start Date End Date Taking? Authorizing Provider  acetaminophen (TYLENOL) 500 MG tablet Take 1,000 mg by mouth every 6 (six) hours as needed (head pain).     [provider]  calcium carbonate (TUMS - DOSED IN MG ELEMENTAL CALCIUM) 500 MG chewable tablet Chew 1 tablet by mouth daily.    [provider]  calcium citrate-vitamin D (CITRACAL+D) 315-200 MG-UNIT per tablet Take 1 tablet by mouth 3 (three) times daily.    [provider]  carbidopa-levodopa (SINEMET IR) 25-100 MG tablet TAKE 2 TABLETS BY MOUTH THREE TIMES DAILY 10/02/18   Tat, Eustace Quail, DO  Cholecalciferol (VITAMIN D3) 2000 units TABS Take 1 capsule by mouth daily.    [provider]  famotidine-calcium carbonate-magnesium hydroxide (PEPCID COMPLETE) 10-800-165 MG CHEW chewable tablet Chew 1 tablet by mouth daily as needed (heartburn).    [provider]  KLOR-CON M20 20 MEQ tablet  Take 20 mEq by mouth daily. 05/26/17   [provider]  KRILL OIL PO Take by mouth.    [provider]  Melatonin 3 MG TABS Take by mouth.    [provider]  Multiple Vitamin (MULTIVITAMIN) tablet Take 1 tablet by mouth daily at 12 noon.     [provider]  Multiple Vitamins-Minerals (VISION-VITE PRESERVE PO) Take 1 tablet by mouth 2 (two) times daily.    [provider]  pantoprazole (PROTONIX) 40 MG tablet Take 40 mg by mouth daily. 06/07/17   [provider]  Probiotic Product (PROBIOTIC-10 PO) Take by mouth.    [provider]  simvastatin (ZOCOR) 20 MG tablet Take 20 mg by mouth every evening.    [provider]  triamterene-hydrochlorothiazide (MAXZIDE-25) 37.5-25 MG per tablet Take 1 tablet by mouth every morning.     [provider]  vitamin B-12 (CYANOCOBALAMIN) 1000 MCG tablet Take 1,000 mcg by mouth every morning.    [provider]  warfarin (COUMADIN) 5 MG tablet Take 5 mg by mouth. Take 7.5 mg by mouth every Monday, Wednesday, Friday. Take 5 mg by mouth all other days.    [provider]    Family History Family History  Problem Relation Age of Onset  . Heart disease Mother   . Heart disease Father 13       CABG  . Other Brother        AVR    Social History Social History   Tobacco Use  . Smoking status: Never Smoker  . Smokeless tobacco: Never Used  Substance Use Topics  . Alcohol use: No    Alcohol/week: 0.0 standard drinks  . Drug use: No     Allergies   Demerol [meperidine]; Oxycodone; Adhesive [tape]; Ivp dye [iodinated diagnostic agents]; and Phenergan [promethazine]   Review of Systems Review of Systems  Constitutional: Negative for fever.  Respiratory: Positive for shortness of breath. Negative for cough.   Cardiovascular: Positive for chest pain. Negative for leg swelling.  Gastrointestinal: Negative for abdominal pain, diarrhea, nausea and vomiting.    Genitourinary: Negative for dysuria and hematuria.  Neurological: Negative for dizziness, syncope, weakness, light-headedness and numbness.  All other systems reviewed and are negative.    Physical Exam Updated Vital Signs BP 107/68   Pulse (!) 144   Temp 98.8 F (37.1 C) (Oral)   Resp (!) 30   Ht 4' 11"  (1.499 m)   Wt 62.1 kg   SpO2 96%  BMI 27.67 kg/m   Physical Exam  Constitutional: She appears well-developed and well-nourished. No distress.  HENT:  Head: Normocephalic and atraumatic.  Eyes: Conjunctivae are normal.  Neck: Neck supple.  Cardiovascular: Regular rhythm, normal heart sounds and intact distal pulses. Tachycardia present.  Pulses:      Radial pulses are 2+ on the right side, and 2+ on the left side.  Pulmonary/Chest: Breath sounds normal. Tachypnea noted.  Abdominal: Soft. There is no tenderness. There is no guarding.  Genitourinary: Rectal exam shows guaiac positive stool.  Genitourinary Comments: No external hemorrhoids, fissures, or lesions noted. No gross blood, melena, or stool burden. No rectal tenderness. No foreign bodies noted.  RN, Hope, served as Producer, television/film/video during the rectal exam.  Musculoskeletal: She exhibits no edema.  Edema and ecchymosis throughout the left arm into the hand.  Some tenderness, but none out of proportion.  All compartments are soft. Patient bruising noted into the left chest.  Lymphadenopathy:    She has no cervical adenopathy.  Neurological: She is alert.  Sensation to light touch grossly intact in the bilateral upper extremities. Grip strength equal bilaterally. Strength 5/5 in the biceps/triceps bilaterally.  Skin: Skin is warm and dry. She is not diaphoretic.  Psychiatric: She has a normal mood and affect. Her behavior is normal.  Nursing note and vitals reviewed.    ED Treatments / Results  Labs (all labs ordered are listed, but only abnormal results are displayed) Labs Reviewed  BASIC METABOLIC PANEL - Abnormal;  Notable for the following components:      Result Value   Chloride 97 (*)    Glucose, Bld 110 (*)    BUN 39 (*)    Creatinine, Ser 1.83 (*)    All other components within normal limits  CBC WITH DIFFERENTIAL/PLATELET - Abnormal; Notable for the following components:   RBC 1.72 (*)    Hemoglobin 5.6 (*)    HCT 18.5 (*)    MCV 107.6 (*)    All other components within normal limits  D-DIMER, QUANTITATIVE (NOT AT [facility]) - Abnormal; Notable for the following components:   D-Dimer, Quant 3.90 (*)    All other components within normal limits  PROTIME-INR - Abnormal; Notable for the following components:   Prothrombin Time 34.2 (*)    All other components within normal limits  POC OCCULT BLOOD, ED - Abnormal; Notable for the following components:   Fecal Occult Bld POSITIVE (*)    All other components within normal limits  URINALYSIS, ROUTINE W REFLEX MICROSCOPIC  TSH  MAGNESIUM  I-STAT TROPONIN, ED  POC OCCULT BLOOD, ED  TYPE AND SCREEN  PREPARE RBC (CROSSMATCH)    EKG EKG Interpretation  Date/Time:  Tuesday December 05 2018 07:18:03 EST Ventricular Rate:  149 PR Interval:    QRS Duration: 125 QT Interval:  330 QTC Calculation: 520 R Axis:   115 Text Interpretation:  Nonspecific intraventricular conduction delay rate faster compared to Sept 2017 LBBB similar to 2017 Confirmed by Sherwood Gambler [phone]) on 12/05/2018 7:30:02 AM   EKG Interpretation  Date/Time:  Tuesday December 05 2018 07:54:22 EST Ventricular Rate:  132 PR Interval:    QRS Duration: 139 QT Interval:  366 QTC Calculation: 543 R Axis:   114 Text Interpretation:  Atrial flutter with predominant 2:1 AV block Nonspecific intraventricular conduction delay Anterior infarct, old Borderline repolarization abnormality Confirmed by Sherwood Gambler [phone]) on 12/05/2018 8:48:39 AM        Radiology Dg Chest Portable 1 View  Result Date: 12/05/2018 CLINICAL DATA:  Chest pain. EXAM: PORTABLE CHEST 1 VIEW  COMPARISON:  Radiographs of September 19, 2016. FINDINGS: Stable cardiomediastinal silhouette. Sternotomy wires are noted. No pneumothorax or pleural effusion is noted. Right lung is clear. Mild left perihilar interstitial opacities are noted concerning for subsegmental atelectasis or possibly infiltrate. Bony thorax is unremarkable. IMPRESSION: Mild left perihilar interstitial opacities are noted concerning for subsegmental atelectasis or possibly pneumonia. Electronically Signed   By: Marijo Conception, M.D.   On: 12/05/2018 09:02    Procedures .Critical Care Performed by: Lorayne Bender, PA-C Authorized by: Lorayne Bender, PA-C   Critical care provider statement:    Critical care time (minutes):  35   Critical care time was exclusive of:  Separately billable procedures and treating other patients   Critical care was necessary to treat or prevent imminent or life-threatening deterioration of the following conditions: Symptomatic anemia.   (including critical care time)  Medications Ordered in ED Medications  0.9 %  sodium chloride infusion (Manually program via Guardrails IV Fluids) (has no administration in time range)  sodium chloride 0.9 % bolus 1,000 mL (1,000 mLs Intravenous New Bag/Given 12/05/18 0759)     Initial Impression / Assessment and Plan / ED Course  I have reviewed the triage vital signs and the nursing notes.  Pertinent labs & imaging results that were available during my care of the patient were reviewed by me and considered in my medical decision making (see chart for details).  Clinical Course as of Dec 05 945  Tue Dec 05, 2018  1610 This was ordered after initial interview with the patient. We now have more information and more likely cause for patient's symptoms.  Now think PE to be less likely, especially with supratherapeutic INR yesterday and today.  D-Dimer, Quant(!): 3.90 [SJ]  [id number] Patient states she has had bright red blood per rectum in the past, but has not  noted any recently.  She denies abdominal pain.  Fecal Occult Blood, POC(!): POSITIVE [SJ]  9604 Spoke with Dr. Lorin Mercy, hospitalist. Agrees to admit the patient.  She would like to assess the patient before any orders for further imaging are placed, including CT scans.   [SJ]    Clinical Course User Index [SJ] Kyandra Mcclaine C, PA-C    Patient presents with chest pain. Initial EKG tachycardic in the 140-150s.  Question of possible a flutter.  With IV fluids, patient's heart rate slowed enough to note atrial flutter on subsequent EKG.  Suspect patient's heart rate may be the cause of her chest pain and shortness of breath.  After Dr. Regenia Skeeter discussed the possibility for cardioversion versus rate control medication with cardiology, patient's hemoglobin returned at 5.6.  Suspect this anemia may be driving the patient's tachycardia and tachypnea. Troponin negative.  Hemoccult positive.  Abdomen nontender. Patient admitted for further management.  Findings and plan of care discussed with Sherwood Gambler, MD. Dr. Regenia Skeeter personally evaluated and examined this patient.  Vitals:   12/05/18 0730 12/05/18 0733 12/05/18 0745 12/05/18 0800  BP: 112/67   107/68  Pulse: (!) 147 (!) 146 (!) 146 (!) 144  Resp: (!) 28 (!) 24 (!) 22 (!) 30  Temp:      TempSrc:      SpO2: 96% 97% 97% 96%  Weight:      Height:         Final Clinical Impressions(s) / ED Diagnoses   Final diagnoses:  Chest pain, unspecified type  Tachycardia  Atrial flutter, unspecified type ([place])  Low hemoglobin  Heme positive stool    ED Discharge Orders    None       Layla Maw 12/05/18 0370    Sherwood Gambler, MD 12/05/18 1525

## 2018-12-05 NOTE — Progress Notes (Signed)
Dr. Lorin Mercy notified about patient being short of breath and resp being in the 30's. IV Lasix ordered.

## 2018-12-05 NOTE — Consult Note (Addendum)
Cardiology Consultation:   Patient ID: [name] MRN: [id number]; DOB: 1941-10-06  Admit date: 12/05/2018 Date of Consult: 12/05/2018  Primary Care Provider: Leanna Battles, MD Primary Cardiologist: Dr. Martinique Primary Electrophysiologist:  Dr. Caryl Comes (2017)   Patient Profile:   [name] is a 77 y.o. female with a hx of VHD/AS w/mechanical AVR (2007), HTN, HLD, CKD, h/o R breast cancer treated with surgery/chemo/XRT, LBBB, and a Parkinson-like syndrome with unsteady gait with falls, and AFib who is being seen today for the evaluation of aflutter w/RVR at the request of Dr. Regenia Skeeter.  History of Present Illness:   Ms. [name] last saw Dr. Martinique in Oct of this year, at that bvisit he mentions h/o bradycardia that required stopping her BB.  He reported prior visit with Dr. Caryl Comes who at that time in 2017 (after a ER visit with RVR) recommended given infrequent episodes of arrhythmia to follow, though if became frequent would consider AAD such as Tikosyn, or possibly revisit betablocker.  She reported at that visit irregular pulse only occasionally.  She was in rate controlled AFib at that visit, not planned for intervention or med changes for a 6 mo f/u.  The patient a week ago Saturday fell, striking her chest/arm on her vanity/drawer.  She (and her husband) reports seeing her PMD the Monday following having xrays done without fractures, she had ongoing/escalating L arm pain and referred to orthopedics.  She saw the ortho MD yesterday who did a number of xrays again, no reports of fractures though was concerned with extent of bruising and recommended she have her INR checked.  She went to her PMD office INR yesterday reported to be 4.3, with instructions to hold her warfarin last night and reduce the dose in 1/2.  She woke about 4-5AM this Am with heart racing and chest pressure.  She reports having more frequent periods of "AFib: in the last week.  Does not think she is  in AF all of the time (though I am not certain that her symptoms of AF are reliable)  She arrives in Aflutter with RVR, BP has been stable.  She has no active CP, feels breathless, bot not overtly SOB.  She c/w L shoulder/arm discomfort from her fall.  LABS K+ 3.8 BUN/Creat 39/1.83 (baseline looks WBC 8.3 H/H 5.6/18.5 Plts 226 PT 34.2 INR 3.45 (patient has mechanical AVR) D-dimer 3.90 poc Trop 0.05   Past Medical History:  Diagnosis Date  . Anticoagulated on Coumadin   . Arthropathy of cervical facet joint    C2-3  . At risk for falls   . BPV (benign positional vertigo)   . Bruises easily   . Cancer ([place]) 2004    breast-lumpectomy,nodes ,radiation,chemo  . Chronic neck pain   . CKD (chronic kidney disease), stage II   . Diverticulosis of colon   . Elevated troponin    a. 08/2016 in setting of PAF;  b. 08/2016 low risk MV w/ fixed septal defect (LBBB), EF 53%.  Marland Kitchen GERD (gastroesophageal reflux disease)   . Hiatal hernia   . History of aortic valve stenosis   . History of breast cancer per pt no recurrence   dx 2004-- Left breast DCIS (ER/PR+, HER2 negative) s/p  partial masectomy w/ sln bx and  chemoradiotion (completed 2004)  . History of colon polyps   . History of herpes zoster   . History of kidney stones   . History of kidney stones   . HTN (hypertension)   .  Hyperlipidemia   . Hypertrophy of inter-atrial septum    a. 09/2014 Echo: ? of atrial mass; b. 10/2014 Cardiac MRI: no atrial septal mass - polypoid lipomatous hypertrophy of superior atrial septum noted-->Benign.  . Irritable bowel syndrome   . LBBB (left bundle branch block)   . PAF (paroxysmal atrial fibrillation) (HCC)    a. CHA2DS2VASc = 4-->coumadin.  . Parkinsonism [facility])    neurologist-  dr tat  . Personal history of chemotherapy 2004  . Personal history of radiation therapy 2004  . PONV (postoperative nausea and vomiting)   . PVC's (premature ventricular contractions)   . Right ureteral stone   .  S/P aortic valve replacement with prosthetic valve    a. 04/13/2006 s/p St Jude mechnical AVR for severe AS;  b. 09/2014 Echo: EF 40-45%, gr1 DD, mild AI/MR, triv TR/PR.  . SUI (stress urinary incontinence, female)   . Unstable balance    uses cane  . Venous varices      Past Surgical History:  Procedure Laterality Date  . ANTERIOR AND POSTERIOR REPAIR  1990   cystocele/ rectocele  . AORTIC VALVE REPLACEMENT  04/13/06   dr gerhardt   and Replacement Ascending Aorta (St. Jude mechanical prosthesis)  . APPENDECTOMY  child 1950  . BREAST LUMPECTOMY Left 01/11/2003   malignant  . BREAST SURGERY Left 2004  . CARDIAC CATHETERIZATION  02-22-2001   dr Martinique   minimal non-obstructive cad/  mild aortic stenosis and aortic root enlargement/  normal LVF, ef 65%  . CARDIAC CATHETERIZATION  03-22-2006    dr Martinique   no significant obstructive cad/  severe aortic stenosis and mild to moderate aortic root enlargement/  upper normal right heart pressures  . CARDIOVASCULAR STRESS TEST  09-30-2011   dr Martinique   lexiscan nuclear study w/ apical thinning  vs  small prior apical infarct w/ no significant ischemia/  hypokinesis of the distal septum and apex, ef 50%  . CARPAL TUNNEL RELEASE Bilateral left 09-09-2004/  right 2007  . CATARACT EXTRACTION W/ INTRAOCULAR LENS  IMPLANT, BILATERAL  2015  . COLONOSCOPY N/A 10/03/2014   Procedure: COLONOSCOPY;  Surgeon: [name], MD;  Location: [place];  Service: Endoscopy;  Laterality: N/A;  . CYSTOSCOPY WITH RETROGRADE PYELOGRAM, URETEROSCOPY AND STENT PLACEMENT Right 09/17/2016   Procedure: CYSTOSCOPY WITH RIGHT RETROGRADE PYELOGRAM, RIGHT URETEROSCOPY AND STENT PLACEMENT LASER LITHOTRIPSY, RIGHT STENT PLACEMENT;  Surgeon: [name], MD;  Location[facility];  Service: Urology;  Laterality: Right;  . ESOPHAGEAL MANOMETRY N/A 08/12/2014   Procedure: ESOPHAGEAL MANOMETRY (EM);  Surgeon: [name], MD;  Location: [facility];   Service: Endoscopy;  Laterality: N/A;  . ESOPHAGOGASTRODUODENOSCOPY N/A 10/03/2014   Procedure: ESOPHAGOGASTRODUODENOSCOPY (EGD);  Surgeon: [name], MD;  Location: [facility];  Service: Endoscopy;  Laterality: N/A;  . Ray  . FOOT SURGERY Right 2013   hammertoe x2  . HOLMIUM LASER APPLICATION Right 1/69/6789   Procedure: HOLMIUM LASER APPLICATION;  Surgeon: [name], MD;  Location[facility];  Service: Urology;  Laterality: Right;  . PERCUTANEOUS NEPHROSTOLITHOTOMY  1993  . STONE EXTRACTION WITH BASKET Right 09/17/2016   Procedure: STONE EXTRACTION WITH BASKET;  Surgeon: [name], MD;  Location: [facility];  Service: Urology;  Laterality: Right;  . TOTAL ABDOMINAL HYSTERECTOMY  1971   w/ Bilateral Salpingoophorectomy  . TRANSTHORACIC ECHOCARDIOGRAM  10/24/2014   dr Martinique   hypokinesis of the  basal and mid inferoseptal and inferior walls,  ef 40-45%,  grade 1 diastolic dysfunction/  mechanical bileaflet aortic valve noted w/ mild regur., peak grandient 37mHg, valve area 1.35cm^2/  severe MV calcification without stenosis w/ mild regurg., peak gradient 357mg/  mild LAE/  poss. atrial mass (MRI showed benign lipomatous hypertrophy of atrial septum) /tFrazier ButtR/mild TR     Home Medications:  Prior to Admission medications   Medication Sig Start Date End Date Taking? Authorizing Provider  acetaminophen (TYLENOL) 500 MG tablet Take 1,000 mg by mouth every 6 (six) hours as needed (head pain).     [provider]  calcium carbonate (TUMS - DOSED IN MG ELEMENTAL CALCIUM) 500 MG chewable tablet Chew 1 tablet by mouth daily.    [provider]  calcium citrate-vitamin D (CITRACAL+D) 315-200 MG-UNIT per tablet Take 1 tablet by mouth 3 (three) times daily.    [provider]  carbidopa-levodopa (SINEMET IR) 25-100 MG tablet TAKE 2 TABLETS BY MOUTH THREE TIMES DAILY 10/02/18   Tat,  ReEustace QuailDO  Cholecalciferol (VITAMIN D3) 2000 units TABS Take 1 capsule by mouth daily.    [provider]  famotidine-calcium carbonate-magnesium hydroxide (PEPCID COMPLETE) 10-800-165 MG CHEW chewable tablet Chew 1 tablet by mouth daily as needed (heartburn).    [provider]  KLOR-CON M20 20 MEQ tablet Take 20 mEq by mouth daily. 05/26/17   [provider]  KRILL OIL PO Take by mouth.    [provider]  Melatonin 3 MG TABS Take by mouth.    [provider]  Multiple Vitamin (MULTIVITAMIN) tablet Take 1 tablet by mouth daily at 12 noon.     [provider]  Multiple Vitamins-Minerals (VISION-VITE PRESERVE PO) Take 1 tablet by mouth 2 (two) times daily.    [provider]  pantoprazole (PROTONIX) 40 MG tablet Take 40 mg by mouth daily. 06/07/17   [provider]  Probiotic Product (PROBIOTIC-10 PO) Take by mouth.    [provider]  simvastatin (ZOCOR) 20 MG tablet Take 20 mg by mouth every evening.    [provider]  triamterene-hydrochlorothiazide (MAXZIDE-25) 37.5-25 MG per tablet Take 1 tablet by mouth every morning.     [provider]  vitamin B-12 (CYANOCOBALAMIN) 1000 MCG tablet Take 1,000 mcg by mouth every morning.    [provider]  warfarin (COUMADIN) 5 MG tablet Take 5 mg by mouth. Take 7.5 mg by mouth every Monday, Wednesday, Friday. Take 5 mg by mouth all other days.    [provider]    Inpatient Medications: Scheduled Meds: . sodium chloride   Intravenous Once   Continuous Infusions: . sodium chloride 1,000 mL (12/05/18 0759)   PRN Meds:   Allergies:    Allergies  Allergen Reactions  . Demerol [Meperidine] Shortness Of Breath and Swelling  . Oxycodone Other (See Comments)    Hallucinations   . Adhesive [Tape] Other (See Comments)    Redness and skin tears  . Ivp Dye [Iodinated Diagnostic Agents] Hives    OK with benadryl pre-med (5032mne  hour before receiving iodinated contrast agent)  . Phenergan [Promethazine] Other (See Comments)    Avoids due to reaction with current medications    Social History:   Social History   Socioeconomic History  . Marital status: Married    Spouse name: Not on file  . Number of children: 3  . Years of education: 10  . Highest education level: Not on file  Occupational History  . 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 Financial resource strain: Not on file  . Food insecurity:    Worry: Not on file    Inability: Not on file  . Transportation needs:    Medical: Not on file    Non-medical: Not on file  Tobacco Use  . Smoking status: Never Smoker  . Smokeless tobacco: Never Used  Substance and Sexual Activity  . Alcohol use: No    Alcohol/week: 0.0 standard drinks  . Drug use: No  . Sexual activity: Not on file  Lifestyle  . Physical activity:    Days per week: Not on file    Minutes per session: Not on file  . Stress: Not on file  Relationships  . Social connections:    Talks on phone: Not on file    Gets together: Not on file    Attends religious service: Not on file    Active member of club or organization: Not on file    Attends meetings of clubs or organizations: Not on file    Relationship status: Not on file  . Intimate partner violence:    Fear of current or ex partner: Not on file    Emotionally abused: Not on file    Physically abused: Not on file    Forced sexual activity: Not on file  Other Topics Concern  . Not on file  Social History Narrative      1/2 -1 soda a day      Family History:  Family History  Problem Relation Age of Onset  . Heart disease Mother   . Heart disease Father 37       CABG  . Other Brother        AVR     ROS:  Please see the history of present illness. All other ROS reviewed and negative.     Physical Exam/Data:   Vitals:   12/05/18 0733 12/05/18 0745 12/05/18 0800  12/05/18 0845  BP:   107/68 126/66  Pulse: (!) 146 (!) 146 (!) 144 (!) 141  Resp: (!) 24 (!) 22 (!) 30 (!) 30  Temp:      TempSrc:      SpO2: 97% 97% 96% 97%  Weight:      Height:       No intake or output data in the 24 hours ending 12/05/18 0856 Filed Weights   12/05/18 0719  Weight: 62.1 kg   Body mass index is 27.67 kg/m.  General:  Well nourished, well developed, in no acute distress HEENT: normal Lymph: no adenopathy Neck: no JVD Endocrine:  No thryomegaly Vascular: No carotid bruits  Cardiac:  Irregular and tachycardic; valve is appreciated Lungs:  CTA b/l, no wheezing, rhonchi or rales  Abd: soft, nontender  Ext: no edema Musculoskeletal:  No deformities.  She has extensive bruising/ecchymosis left thorax, with a large hematoma to L chest (just superior to her mastectomy scar), ecchymosis in various shades extends around to her back as well, and the entirety of her L arm Skin: warm and dry  Neuro:   no focal abnormalities noted Psych:  Normal affect   EKG:  The EKG was personally reviewed and demonstrates:    Aflutter 149bpm, LBBB AFlutter 132bpm, LBBB 10/09/18 AFib 85bpm  Telemetry:  Telemetry was personally reviewed and demonstrates:   AFlutter 100-130's  Relevant CV Studies:  11/17/18: TTE Study Conclusions - Left ventricle: The  cavity size was normal. Wall thickness was   normal. Systolic function was mildly reduced. The estimated   ejection fraction was in the range of 45% to 50%. Features are   consistent with a pseudonormal left ventricular filling pattern,   with concomitant abnormal relaxation and increased filling   pressure (grade 2 diastolic dysfunction). - Aortic valve: A mechanical prosthesis was present. There was mild   regurgitation. Valve area (VTI): 1.43 cm^2. Valve area (Vmax):   1.28 cm^2. Valve area (Vmean): 1.2 cm^2. - Mitral valve: Mildly to moderately calcified annulus. Valve area   by continuity equation (using LVOT flow): 1.55  cm^2. - Left atrium: The atrium was mildly dilated.   09/21/18 stress myoview IMPRESSION: 1. No evidence reversible ischemia. Fixed defect in the septal wall with differential including infarction versus artifact from LEFT bundle branch block. 2. Septal dyskinesia in patient with LEFT bundle branch block. 3. Left ventricular ejection fraction 53% 4. Non invasive risk stratification*: Low   Laboratory Data:  Chemistry Recent Labs  Lab 12/05/18 0715  NA 135  K 3.8  CL 97*  CO2 27  GLUCOSE 110*  BUN 39*  CREATININE PENDING  CALCIUM 9.1  GFRNONAA NOT CALCULATED  GFRAA NOT CALCULATED  ANIONGAP 11    No results for input(s): PROT, ALBUMIN, AST, ALT, ALKPHOS, BILITOT in the last 168 hours. Hematology Recent Labs  Lab 12/05/18 0715  WBC 8.3  RBC 1.72*  HGB 5.6*  HCT 18.5*  MCV 107.6*  MCH 32.6  MCHC 30.3  RDW 15.0  PLT 226   Cardiac EnzymesNo results for input(s): TROPONINI in the last 168 hours.  Recent Labs  Lab 12/05/18 0732  TROPIPOC 0.05    BNPNo results for input(s): BNP, PROBNP in the last 168 hours.  DDimer  Recent Labs  Lab 12/05/18 0715  DDIMER 3.90*    Radiology/Studies:  No results found.  Assessment and Plan:   1. AFlutter with RVR     BP tolerating     Baseline has Afib w/CVR, hs h/o brady requiring stopping BB historically     CHA2DS2Vasc is 4, on warfarin     RVR likely primarily 2/2 prfound anemia/stress of fall/pain  2. Mechanical AVR     On warfarin      3. Marked anemia 4. Recent fall (about 10 days ago)     Extensive ecchymosis L thorax     Large area of hematoma L chest   Dr. Lovena Le has seen and examined the patient  Medicine team will be admitting Tachycardia likely 2/2 to #3 acutely Would not intervene on HR just yet  Recommend transfusion, CT chest/abdomen given trauma and anemia (deferred to medicine team)  For questions or updates, please contact [facility] Please consult [email or web address] for contact info  under   Signed, Baldwin Jamaica, PA-C  12/05/2018 8:56 AM   EP Attending  Patient seen and examined. Agree with above. The patient is quite ill and has fallen and experienced a very large hematoma around her chest. Her hgb is very low and she needs a blood transfusion. I would suggest she have a CT scan of the chest and abdomen to rule out additional bleeding.  With regard to her atrial flutter, I would suggest IV digoxin 0.25 now and daily. I suspect her rate will come down when she has been transfused. Watch for volume overload with transfusion. We will follow with you.    Mikle Bosworth.D.

## 2018-12-05 NOTE — H&P (Addendum)
History and Physical    Keilani Terrance DSK:[id number] DOB: [birth year] DOA: 12/05/2018  PCP: Leanna Battles, MD Consultants:  Tat - neurology; [place] - cardiology; Gioffre/[facility] - orthopedics Patient coming from:  Home - lives with husband; NOK: Husband, [phone]; [phone]  Chief Complaint: chest pain  HPI: [name] is a 77 y.o. female with medical history significant of Parkinson's; HTN; HLD; afib on Coumadin; stage 2 CKD; and remote breast cancer presenting with chest pain.  She fell about 1 1/2 weeks ago.  She has been aching since then and has had significant bruising.  They saw Dr. Sharlett Iles Monday, negative xrays but they did not check INR.  Yesterday AM, she felt worse and it was getting worse and they called PCP again - they sent her to [facility].  Dr. Gladstone Lighter repeated xrays and he was unable to find anything broken despite him being very thorough.  He was concerned about the INR and when it was last checked.  He sent them to have the INR checked and it was 4.3 yesterday - she held her dose last night.  She awoke this AM and was having so much pain that she had to come in.  "Her brain runs faster than her body right now."  Last week's fall was a mechanical fall, has difficulty turning and backing up.  She did not hit her head.  Slight abdominal pain/discomfort.  Nausea x a few days without vomiting.  Loose BMs yesterday x 5, took Imodium and none since.  She can normally tell when she is in afib; it has been noticeable for weeks but has worsened since all of this happened.  She was in afib months ago at her PCP office but at that time she couldn't feel it.  She did have chest pain.  Her stools looked dark starting about a week ago.  No BRBPR.  Chronic dry cough.  + SOB starting last night while lying in bed.  She does still feel SOB.  Her hand and left arm felt hot, stiff, and tingling/numb in left hand and fingers.   ED Course:  Chest pain, awoke her from  sleep.  Likely related to HR 140-150s, looks like flutter.  Considered cardioversion vs. Med management.  Meanwhile, Hgb 5.6.  On Coumadin, INR 3.45.  Heme positive.  She fell a week ago with left arm injury, ?bleeding into arm.  Also with BP, tachycardia, concern for PE, D-dimer positive - unlikely PE with INR ++.  CXR with mild left perihilar interstitial opacities - slight cough, ?pulmonary hemorrhage.  She does have orthopnea.  Plan for CT chest, creatinine 1.83.  Review of Systems: As per HPI; otherwise review of systems reviewed and negative.   Ambulatory Status:  Ambulates with a walker at all times at home, uses a cane and one-person assist when out of the house  Past Medical History:  Diagnosis Date  . Arthropathy of cervical facet joint    C2-3  . At risk for falls   . BPV (benign positional vertigo)   . Cancer ([place]) 2004    breast-lumpectomy,nodes ,radiation,chemo  . Chronic neck pain   . CKD (chronic kidney disease), stage II   . Diverticulosis of colon   . Elevated troponin    a. 08/2016 in setting of PAF;  b. 08/2016 low risk MV w/ fixed septal defect (LBBB), EF 53%.  Marland Kitchen GERD (gastroesophageal reflux disease)   . Hiatal hernia   . History of aortic valve stenosis   .  History of breast cancer per pt no recurrence   dx 2004-- Left breast DCIS (ER/PR+, HER2 negative) s/p  partial masectomy w/ sln bx and  chemoradiotion (completed 2004)  . History of colon polyps   . History of herpes zoster   . History of kidney stones   . HTN (hypertension)   . Hyperlipidemia   . Hypertrophy of inter-atrial septum    a. 09/2014 Echo: ? of atrial mass; b. 10/2014 Cardiac MRI: no atrial septal mass - polypoid lipomatous hypertrophy of superior atrial septum noted-->Benign.  . Irritable bowel syndrome   . LBBB (left bundle branch block)   . PAF (paroxysmal atrial fibrillation) (HCC)    a. CHA2DS2VASc = 4-->coumadin.  . Parkinsonism [facility])    neurologist-  dr tat  . Personal history of  chemotherapy 2004  . Personal history of radiation therapy 2004  . PONV (postoperative nausea and vomiting)   . PVC's (premature ventricular contractions)   . Right ureteral stone   . S/P aortic valve replacement with prosthetic valve    a. 04/13/2006 s/p St Jude mechnical AVR for severe AS;  b. 09/2014 Echo: EF 40-45%, gr1 DD, mild AI/MR, triv TR/PR.  . SUI (stress urinary incontinence, female)   . Symptomatic anemia   . Venous varices      Past Surgical History:  Procedure Laterality Date  . ANTERIOR AND POSTERIOR REPAIR  1990   cystocele/ rectocele  . AORTIC VALVE REPLACEMENT  04/13/06   dr gerhardt   and Replacement Ascending Aorta (St. Jude mechanical prosthesis)  . APPENDECTOMY  child 1950  . BREAST LUMPECTOMY Left 01/11/2003   malignant  . BREAST SURGERY Left 2004  . CARDIAC CATHETERIZATION  02-22-2001   dr [place]   minimal non-obstructive cad/  mild aortic stenosis and aortic root enlargement/  normal LVF, ef 65%  . CARDIAC CATHETERIZATION  03-22-2006    dr [place]   no significant obstructive cad/  severe aortic stenosis and mild to moderate aortic root enlargement/  upper normal right heart pressures  . CARDIOVASCULAR STRESS TEST  09-30-2011   dr [place]   lexiscan nuclear study w/ apical thinning  vs  small prior apical infarct w/ no significant ischemia/  hypokinesis of the distal septum and apex, ef 50%  . CARPAL TUNNEL RELEASE Bilateral left 09-09-2004/  right 2007  . CATARACT EXTRACTION W/ INTRAOCULAR LENS  IMPLANT, BILATERAL  2015  . COLONOSCOPY N/A 10/03/2014   Procedure: COLONOSCOPY;  Surgeon: [name], MD;  Location: [facility];  Service: Endoscopy;  Laterality: N/A;  . CYSTOSCOPY WITH RETROGRADE PYELOGRAM, URETEROSCOPY AND STENT PLACEMENT Right 09/17/2016   Procedure: CYSTOSCOPY WITH RIGHT RETROGRADE PYELOGRAM, RIGHT URETEROSCOPY AND STENT PLACEMENT LASER LITHOTRIPSY, RIGHT STENT PLACEMENT;  Surgeon: [name], MD;  Location: [facility];   Service: Urology;  Laterality: Right;  . ESOPHAGEAL MANOMETRY N/A 08/12/2014   Procedure: ESOPHAGEAL MANOMETRY (EM);  Surgeon: [name], MD;  Location: [facility];  Service: Endoscopy;  Laterality: N/A;  . ESOPHAGOGASTRODUODENOSCOPY N/A 10/03/2014   Procedure: ESOPHAGOGASTRODUODENOSCOPY (EGD);  Surgeon: [name], MD;  Location: [facility];  Service: Endoscopy;  Laterality: N/A;  . [place]  . FOOT SURGERY Right 2013   hammertoe x2  . HOLMIUM LASER APPLICATION Right 7/37/1062   Procedure: HOLMIUM LASER APPLICATION;  Surgeon: [name], MD;  Location: [facility];  Service: Urology;  Laterality: Right;  . PERCUTANEOUS NEPHROSTOLITHOTOMY  1993  . STONE EXTRACTION WITH BASKET  Right 09/17/2016   Procedure: STONE EXTRACTION WITH BASKET;  Surgeon: [name], MD;  Location: [facility];  Service: Urology;  Laterality: Right;  . TOTAL ABDOMINAL HYSTERECTOMY  1971   w/ Bilateral Salpingoophorectomy  . TRANSTHORACIC ECHOCARDIOGRAM  10/24/2014   dr [place]   hypokinesis of the basal and mid inferoseptal and inferior walls,  ef 40-45%,  grade 1 diastolic dysfunction/  mechanical bileaflet aortic valve noted w/ mild regur., peak grandient 33mHg, valve area 1.35cm^2/  severe MV calcification without stenosis w/ mild regurg., peak gradient 343mg/  mild LAE/  poss. atrial mass (MRI showed benign lipomatous hypertrophy of atrial septum) /tFrazier ButtR/mild TR    Social History   Socioeconomic History  . Marital status: Married    Spouse name: Not on file  . Number of children: 3  . Years of education: 10  . Highest education level: Not on file  Occupational History  . 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 Financial resource strain: Not on file  . Food insecurity:    Worry: Not on file    Inability: Not on file  . Transportation needs:    Medical: Not on file     Non-medical: Not on file  Tobacco Use  . Smoking status: Never Smoker  . Smokeless tobacco: Never Used  Substance and Sexual Activity  . Alcohol use: No    Alcohol/week: 0.0 standard drinks  . Drug use: No  . Sexual activity: Not on file  Lifestyle  . Physical activity:    Days per week: Not on file    Minutes per session: Not on file  . Stress: Not on file  Relationships  . Social connections:    Talks on phone: Not on file    Gets together: Not on file    Attends religious service: Not on file    Active member of club or organization: Not on file    Attends meetings of clubs or organizations: Not on file    Relationship status: Not on file  . Intimate partner violence:    Fear of current or ex partner: Not on file    Emotionally abused: Not on file    Physically abused: Not on file    Forced sexual activity: Not on file  Other Topics Concern  . Not on file  Social History Narrative      1/2 -1 soda a day      Allergies  Allergen Reactions  . Demerol [Meperidine] Shortness Of Breath and Swelling  . Oxycodone Other (See Comments)    Hallucinations   . Adhesive [Tape] Other (See Comments)    Redness and skin tears  . Ivp Dye [Iodinated Diagnostic Agents] Hives    OK with benadryl pre-med (5023mne hour before receiving iodinated contrast agent)  . Phenergan [Promethazine] Other (See Comments)    Avoids due to reaction with current medications    Family History  Problem Relation Age of Onset  . Heart disease Mother   . Heart disease Father 76 32    CABG  . Other Brother        AVR    Prior to Admission medications   Medication Sig Start Date End Date Taking? Authorizing Provider  acetaminophen (TYLENOL) 500 MG tablet Take 1,000 mg by mouth every 6 (six) hours as needed (head pain).     [provider]  calcium carbonate (TUMS - DOSED IN  MG ELEMENTAL CALCIUM) 500 MG chewable tablet Chew 1 tablet by mouth daily.    [provider]  calcium  citrate-vitamin D (CITRACAL+D) 315-200 MG-UNIT per tablet Take 1 tablet by mouth 3 (three) times daily.    [provider]  carbidopa-levodopa (SINEMET IR) 25-100 MG tablet TAKE 2 TABLETS BY MOUTH THREE TIMES DAILY 10/02/18   Tat, Eustace Quail, DO  Cholecalciferol (VITAMIN D3) 2000 units TABS Take 1 capsule by mouth daily.    [provider]  famotidine-calcium carbonate-magnesium hydroxide (PEPCID COMPLETE) 10-800-165 MG CHEW chewable tablet Chew 1 tablet by mouth daily as needed (heartburn).    [provider]  KLOR-CON M20 20 MEQ tablet Take 20 mEq by mouth daily. 05/26/17   [provider]  KRILL OIL PO Take by mouth.    [provider]  Melatonin 3 MG TABS Take by mouth.    [provider]  Multiple Vitamin (MULTIVITAMIN) tablet Take 1 tablet by mouth daily at 12 noon.     [provider]  Multiple Vitamins-Minerals (VISION-VITE PRESERVE PO) Take 1 tablet by mouth 2 (two) times daily.    [provider]  pantoprazole (PROTONIX) 40 MG tablet Take 40 mg by mouth daily. 06/07/17   [provider]  Probiotic Product (PROBIOTIC-10 PO) Take by mouth.    [provider]  simvastatin (ZOCOR) 20 MG tablet Take 20 mg by mouth every evening.    [provider]  triamterene-hydrochlorothiazide (MAXZIDE-25) 37.5-25 MG per tablet Take 1 tablet by mouth every morning.     [provider]  vitamin B-12 (CYANOCOBALAMIN) 1000 MCG tablet Take 1,000 mcg by mouth every morning.    [provider]  warfarin (COUMADIN) 5 MG tablet Take 5 mg by mouth. Take 7.5 mg by mouth every Monday, Wednesday, Friday. Take 5 mg by mouth all other days.    [provider]    Physical Exam: Vitals:   12/05/18 1352 12/05/18 1400 12/05/18 1407 12/05/18 1646  BP: 92/74 111/66 111/66 117/64  Pulse:  94  79  Resp: (!) 31 (!) 22 (!) 30 20  Temp: 98 F (36.7 C)  98.3 F (36.8 C) 99 F (37.2 C)  TempSrc:   Oral  Oral  SpO2:  96% 99% 90%  Weight:      Height:         General:  Appears calm and comfortable abut ill; she has asynchronous breathing with use of some of her abdominal muscles Eyes:  PERRL, EOMI, normal lids, iris ENT:  grossly normal hearing, lips & tongue, mmm Neck:  no LAD, masses or thyromegaly; no carotid bruits Cardiovascular:  Aflutter with tachcyardia, no m/r/g. No LE edema.  Respiratory:   CTA bilaterally with no wheezes/rales/rhonchi.  Mildly increased respiratory effort. Abdomen:  soft, NT, ND, NABS other than large left chest wall/abdominal hematoma Skin:  Marked ecchymoses from recent fall; left arm appears ducky in color but pulse ox is present on that hand and tracking well with good cap refill and so the fingers appear to be perfusing        Musculoskeletal:  grossly normal tone BUE/BLE, good ROM, no bony abnormality Lower extremity:  No LE edema.  Limited foot exam with no ulcerations.  2+ distal pulses. Psychiatric:  blunted mood and affect, speech fluent and appropriate, AOx3 Neurologic:  CN 2-12 grossly intact, moves all extremities in coordinated fashion, sensation intact    Radiological Exams on Admission: Dg Chest Portable 1 View  Result Date: 12/05/2018  CLINICAL DATA:  Chest pain. EXAM: PORTABLE CHEST 1 VIEW COMPARISON:  Radiographs of September 19, 2016. FINDINGS: Stable cardiomediastinal silhouette. Sternotomy wires are noted. No pneumothorax or pleural effusion is noted. Right lung is clear. Mild left perihilar interstitial opacities are noted concerning for subsegmental atelectasis or possibly infiltrate. Bony thorax is unremarkable. IMPRESSION: Mild left perihilar interstitial opacities are noted concerning for subsegmental atelectasis or possibly pneumonia. Electronically Signed   By: Marijo Conception, M.D.   On: 12/05/2018 09:02    EKG: Independently reviewed.   0718 - IVCD with rate 149; LBBB 0754 - Aflutter with 2:1 block, rate 132; nonspecific  ST changes with no evidence of acute ischemia  Labs on Admission: I have personally reviewed the available labs and imaging studies at the time of the admission.  Pertinent labs:   Glucose 110 BUN 39/Creatinine 1.83; 17/1.58/GFR 36 in 9/17 Troponin 0.05 Hgb 5.6; 9.4 in 9/17 D-dimer 3.90 INR 3.45 Heme positive  Assessment/Plan Principal Problem:   Symptomatic anemia Active Problems:   S/P AVR (aortic valve replacement)   HTN (hypertension)   Hypercholesteremia   Atrial fibrillation with RVR (HCC)   Chronic anticoagulation   GI bleed   CKD (chronic kidney disease), stage III (HCC)   Elevated d-dimer   SOB (shortness of breath)   Supratherapeutic INR   Parkinsonism (HCC)   Symptomatic anemia -Patient presenting with chest/abdominal pain and SOB -Found to have Hgb 5.6, baseline is 9-10 -She had a recent fall with significant ecchymoses; evaluation thus far has been negative for significant injury -Her left chest wall, abdomen, and back all with marked ecchymoses; she also has dusky ecchymosis along her left arm and Dr. Scot Dock was kind enough to take a look and agree that this does not appear to need any intervention since she is perfusing her hand. -With her marked ecchymoses, it is possible that this is all related to trauma, but will order stat CT C/A/P to r/o internal bleeding in the setting of supratherapeutic INR -She is also heme positive and so the blood may be leaving via her GI tract (see below) -Will admit to SDU bed. -Transfuse 2 units PRBC to start and recheck Hgb afterwards.  Given her very low starting Hgb, she is likely to require more units.   -Will give Lasix now since she is continuing to have labored breathing even after 2 units PRBC  Afib/flutter  -Patient with aflutter with RVR upon arrival -Initial thought was for electrical cardioversion prior to Hgb returning -For now, will admit to SDU with ongoing monitoring with blood transfusion; her HR appears to  be rate controlled at this time -Holding [facility] (see above and below)  GI bleeding -Patient reports h/o tarry stools and was heme positive -While it is possible that she had some blood loss related to the fall, GI bleeding appears to be contributing -Type and screen were done in ED.  -Two units of blood were ordered by ED; will repeat CBC post-transfusion and give an additional 2 units if Hgb remains <10 (very likely).  - GI consulted by ED, will follow up recommendations - She appears likely to need eventual EGD - Start IV pantoprazole 40 mg bid - Zofran IV for nausea - Avoid NSAIDs and SQ heparin - Maintain IV access (2 large bore IVs if possible). - Monitor closely and follow cbc, transfuse as necessary. - Repeat q12 INR  Chronic anticoagulation, supratherapeutic INR -Patient is taking Coumadin for afib/AVR -INR yesterday was 4, improved today -For now,  will continue to allow to drift down - although if frank bleeding develops she may require the rapid reversal protocol -It is difficult to know exactly when to resume AC, but likely when her INR is < 2.5 -Would suggest heparin drip to start, since this can be held easily in the setting of bleeding  Stage 3 CKD -Appears to be slightly worse than at last check but there are not recent records in the system and so this is currently presumed to be chronic - but will trend -Maxzide may not be her best BP medication option  HTN -Hold Maxzide  HLD -Continue Zocor  Elevated D-dimer with SOB -SOB is likely multifactorial and may be related to afib, RVR, ABLA, etc -D-dimer was checked and positive, which complicates her picture -With supratherapeutic INR, PE likelihood is quite low -For now, will not further evaluate for this issue -Elevated D-dimer may be related to recent trauma, other inflammatory issues  Parkinson's -Continue Sinemet   DVT prophylaxis: Coumadin - drifting down Code Status:  Full - confirmed with  patient/family Family Communication: Husband and daughters present throughout evaluation  Disposition Plan:  Home once clinically improved Consults called: GI; Vascular; Cardiology  Admission status: Admit - It is my clinical opinion that admission to [facility] is reasonable and necessary because of the expectation that this patient will require hospital care that crosses at least 2 midnights to treat this condition based on the medical complexity of the problems presented.  Given the aforementioned information, the predictability of an adverse outcome is felt to be significant.   Total critical care time: 70 minutes Critical care time was exclusive of separately billable procedures and treating other patients. Critical care was necessary to treat or prevent imminent or life-threatening deterioration. Critical care was time spent personally by me on the following activities: development of treatment plan with patient and/or surrogate as well as nursing, discussions with consultants, evaluation of patient's response to treatment, examination of patient, obtaining history from patient or surrogate, ordering and performing treatments and interventions, ordering and review of laboratory studies, ordering and review of radiographic studies, pulse oximetry and re-evaluation of patient's condition.     Karmen Bongo MD [facility]  If note is complete, please contact covering daytime or nighttime physician. [email or web address] Password [email or web address]  12/05/2018, 6:10 PM

## 2018-12-05 NOTE — ED Triage Notes (Signed)
Pt complaining of CP that started this morning; substernal 8/10. Pt took 324 mg ASA. 2 nitro. Pain decreased to 7/10. Pt had a fall last week, pt was told by primary MD that her "blood was too thin"; pt is on coumadin. Pt came off coumadin yesterday; she is supposed to restart today. BP 100/39, HR 150 hx of Afib, 99% on nonrebreather. CBG 122.

## 2018-12-05 NOTE — Progress Notes (Signed)
Pt c/o nausea, removed BIPAP mask by herself. HR elevated in 140s, SpO2 in 80s. Pt asymptomatic and other VS stable.  Zofran given for nausea, pt placed on oxygen via nasal cannula. HR in range 110-118, SpO2 97 on 4 L nasal cannula. Will check back on pt in 15 minutes and fallow up on nausea. If improved will place pt on BiPAP.

## 2018-12-05 NOTE — Consult Note (Signed)
[facility] Consult: 11:25 AM 12/05/2018  LOS: 0 days    Referring Provider: Dr Zada Girt  Primary Care Physician:  Leanna Battles, MD Primary Gastroenterologist:  Dr Lattie Haw >> Carlean Purl.   Reason for Consultation:  GI Bleed, anemia in pt on Coumadin.     HPI: [name] is a 77 y.o. female.  PMH breast cancer.  Stage 2 CKD.  On Coumadin for prosthetic aortic valve, paroxysmal A. Fib.  EF 45 -50%, grade 2 DD per echo 09/2018.  Kidney stones.  GERD. Nasal polyps.  Atypical Parkinson's, progressive supranuclear palsy.  Macrocytic anemia in 2017, on oral B12.  CKD stage 3   09/1994 colonoscopy.  Polyp, adenomatous, at hepatic flexure.  Left colon diverticulosis 02/1998 colonoscopy.  no polyps, extensive sigmoid diverticulosis 11/2001 EGD for reflux symptoms.  Vocal cord edema and granularity.  Benign distal esophageal stenosis dilated with [facility] dilator.  Prolapsing, 4 cm hiatal hernia. 12/2001 colonoscopy.  For polyp surveillance and family history of colon cancer.  No polyps.  Sigmoid diverticulosis 01/2005 colonoscopy.  4 mm sessile cecal polyp, adenomatous without Hg D.  Sigmoid diverticulosis. 2011 colonoscopy for history of adenomatous colon polyps and family history colon cancer.  Very sigmoid diverticulosis, otherwise normal study. 09/2014 EGD. For evaluation of chest pain, heartburn and known hiatal hernia.  Dr. Carlean Purl noted 10 cm hiatal hernia, otherwise normal study. 09/2014 colonoscopy.  For family history colon cancer, screening study.  Severe sigmoid diverticulosis with luminal narrowing and angulation necessitating pediatric colonoscope.  Study completed to the cecum was otherwise normal.  At the time of that colonoscopy Dr. Arelia Longest noted that "routine repeat colonoscopy  screening not necessary.  See GI as needed". 05/2018 MBSS: normal swallow, intermittent coughing, small Zenker's diverticulum with minimal amounts of rentention that would clear with additional boluses.  Mild aspiration risk. Continue regular diet, thin liquids.     Saturday, 10 days ago, patient fell and sustained an injury to left arm and thoracic area laterally.  Atraumatic x-rays.  She developed ever increasing in size hematoma going all the way down her left arm and extensive hematoma on the left side of her torso.  She continued to have pain and was referred by her PMD to Dr. Gladstone Lighter, orthopedic MD yesterday.  More imaging was taken, apparently this was atraumatic.  He to was concerned by the extent of the hematoma and advised her to present to Dr. Buel Ream clinic for INR testing which had not been done in 3 weeks, it is generally done on a monthly basis.  INR 4.3, so Coumadin held yesterday she was to restart at a lower dose today. 4 AM this morning she was awakened with pressure in her substernal chest.  She has had intermittent nausea.  No vomiting.  She has had dark stools intermittently for a few months.  At times the stools are even tarry/black.  Recent days the stools have been brown but she said she had diarrhea yesterday.  She takes OTC Pepcid and Tums as needed about 3 times a week for nausea and  GI upset.  This despite the fact that she takes Protonix 40 mg daily. Patient's chest pressure was relieved with 2 SL NTG.  In the ED Heart rate is A flutter, in 140s.   Hgb 5.6.  9.4 in 08/2016.   MCV 107 AKI vs progression of CKD.    Transfusion of RBCs ordered, no plans to aggressively correct the INR.  Past Medical History:  Diagnosis Date  . Arthropathy of cervical facet joint    C2-3  . At risk for falls   . BPV (benign positional vertigo)   . Cancer ([place]) 2004    breast-lumpectomy,nodes ,radiation,chemo  . Chronic neck pain   . CKD (chronic kidney disease), stage II   .  Diverticulosis of colon   . Elevated troponin    a. 08/2016 in setting of PAF;  b. 08/2016 low risk MV w/ fixed septal defect (LBBB), EF 53%.  Marland Kitchen GERD (gastroesophageal reflux disease)   . Hiatal hernia   . History of aortic valve stenosis   . History of breast cancer per pt no recurrence   dx 2004-- Left breast DCIS (ER/PR+, HER2 negative) s/p  partial masectomy w/ sln bx and  chemoradiotion (completed 2004)  . History of colon polyps   . History of herpes zoster   . History of kidney stones   . HTN (hypertension)   . Hyperlipidemia   . Hypertrophy of inter-atrial septum    a. 09/2014 Echo: ? of atrial mass; b. 10/2014 Cardiac MRI: no atrial septal mass - polypoid lipomatous hypertrophy of superior atrial septum noted-->Benign.  . Irritable bowel syndrome   . LBBB (left bundle branch block)   . PAF (paroxysmal atrial fibrillation) (HCC)    a. CHA2DS2VASc = 4-->coumadin.  . Parkinsonism [facility])    neurologist-  dr tat  . Personal history of chemotherapy 2004  . Personal history of radiation therapy 2004  . PONV (postoperative nausea and vomiting)   . PVC's (premature ventricular contractions)   . Right ureteral stone   . S/P aortic valve replacement with prosthetic valve    a. 04/13/2006 s/p St Jude mechnical AVR for severe AS;  b. 09/2014 Echo: EF 40-45%, gr1 DD, mild AI/MR, triv TR/PR.  . SUI (stress urinary incontinence, female)   . Venous varices      Past Surgical History:  Procedure Laterality Date  . ANTERIOR AND POSTERIOR REPAIR  1990   cystocele/ rectocele  . AORTIC VALVE REPLACEMENT  04/13/06   dr gerhardt   and Replacement Ascending Aorta (St. Jude mechanical prosthesis)  . APPENDECTOMY  child 1950  . BREAST LUMPECTOMY Left 01/11/2003   malignant  . BREAST SURGERY Left 2004  . CARDIAC CATHETERIZATION  02-22-2001   dr Martinique   minimal non-obstructive cad/  mild aortic stenosis and aortic root enlargement/  normal LVF, ef 65%  . CARDIAC CATHETERIZATION  03-22-2006    dr  Martinique   no significant obstructive cad/  severe aortic stenosis and mild to moderate aortic root enlargement/  upper normal right heart pressures  . CARDIOVASCULAR STRESS TEST  09-30-2011   dr Martinique   lexiscan nuclear study w/ apical thinning  vs  small prior apical infarct w/ no significant ischemia/  hypokinesis of the distal septum and apex, ef 50%  . CARPAL TUNNEL RELEASE Bilateral left 09-09-2004/  right 2007  . CATARACT EXTRACTION W/ INTRAOCULAR LENS  IMPLANT, BILATERAL  2015  . COLONOSCOPY N/A 10/03/2014   Procedure: COLONOSCOPY;  Surgeon: [name], MD;  Location:  [facility];  Service: Endoscopy;  Laterality: N/A;  . CYSTOSCOPY WITH RETROGRADE PYELOGRAM, URETEROSCOPY AND STENT PLACEMENT Right 09/17/2016   Procedure: CYSTOSCOPY WITH RIGHT RETROGRADE PYELOGRAM, RIGHT URETEROSCOPY AND STENT PLACEMENT LASER LITHOTRIPSY, RIGHT STENT PLACEMENT;  Surgeon: [name], MD;  Location[facility];  Service: Urology;  Laterality: Right;  . ESOPHAGEAL MANOMETRY N/A 08/12/2014   Procedure: ESOPHAGEAL MANOMETRY (EM);  Surgeon: [name], MD;  Location: [facility];  Service: Endoscopy;  Laterality: N/A;  . ESOPHAGOGASTRODUODENOSCOPY N/A 10/03/2014   Procedure: ESOPHAGOGASTRODUODENOSCOPY (EGD);  Surgeon: [name], MD;  Location: [facility];  Service: Endoscopy;  Laterality: N/A;  . [place]  . FOOT SURGERY Right 2013   hammertoe x2  . HOLMIUM LASER APPLICATION Right 09/16/1940   Procedure: HOLMIUM LASER APPLICATION;  Surgeon: [name], MD;  Location: [facility];  Service: Urology;  Laterality: Right;  . PERCUTANEOUS NEPHROSTOLITHOTOMY  1993  . STONE EXTRACTION WITH BASKET Right 09/17/2016   Procedure: STONE EXTRACTION WITH BASKET;  Surgeon: [name], MD;  Location: [facility];  Service: Urology;  Laterality: Right;  . TOTAL ABDOMINAL HYSTERECTOMY  1971   w/ Bilateral  Salpingoophorectomy  . TRANSTHORACIC ECHOCARDIOGRAM  10/24/2014   dr Martinique   hypokinesis of the basal and mid inferoseptal and inferior walls,  ef 40-45%,  grade 1 diastolic dysfunction/  mechanical bileaflet aortic valve noted w/ mild regur., peak grandient 39mHg, valve area 1.35cm^2/  severe MV calcification without stenosis w/ mild regurg., peak gradient 327mg/  mild LAE/  poss. atrial mass (MRI showed benign lipomatous hypertrophy of atrial septum) /tFrazier ButtR/mild TR    Prior to Admission medications   Medication Sig Start Date End Date Taking? Authorizing Provider  acetaminophen (TYLENOL) 500 MG tablet Take 1,000 mg by mouth every 6 (six) hours as needed (head pain).    Yes [provider]  calcium carbonate (TUMS - DOSED IN MG ELEMENTAL CALCIUM) 500 MG chewable tablet Chew 1 tablet by mouth daily.   Yes [provider]  calcium citrate-vitamin D (CITRACAL+D) 315-200 MG-UNIT per tablet Take 1 tablet by mouth 3 (three) times daily.   Yes [provider]  carbidopa-levodopa (SINEMET IR) 25-100 MG tablet TAKE 2 TABLETS BY MOUTH THREE TIMES DAILY Patient taking differently: Take 2 tablets by mouth 3 (three) times daily.  10/02/18  Yes Tat, ReEustace QuailDO  Cholecalciferol (VITAMIN D3) 2000 units TABS Take 1 capsule by mouth daily.   Yes [provider]  famotidine-calcium carbonate-magnesium hydroxide (PEPCID COMPLETE) 10-800-165 MG CHEW chewable tablet Chew 1 tablet by mouth daily as needed (heartburn).   Yes [provider]  KLOR-CON M20 20 MEQ tablet Take 20 mEq by mouth daily. 05/26/17  Yes [provider]  KRILL OIL PO Take 1 capsule by mouth daily.    Yes [provider]  Melatonin 3 MG TABS Take 3 mg by mouth at bedtime as needed (sleep).    Yes [provider]  Multiple Vitamin (MULTIVITAMIN) tablet Take 1 tablet by mouth daily at 12 noon.    Yes [provider]  Multiple Vitamins-Minerals (VISION-VITE PRESERVE  PO) Take 1 tablet by mouth 2 (two) times daily.   Yes [provider]  pantoprazole (PROTONIX) 40 MG tablet Take 40 mg by mouth daily. 06/07/17  Yes [provider]  Probiotic Product (PROBIOTIC-10 PO) Take 1 capsule by mouth daily.    Yes [provider]  simvastatin (ZOCOR) 20  MG tablet Take 20 mg by mouth every evening.   Yes [provider]  triamterene-hydrochlorothiazide (MAXZIDE-25) 37.5-25 MG per tablet Take 1 tablet by mouth every morning.    Yes [provider]  vitamin B-12 (CYANOCOBALAMIN) 1000 MCG tablet Take 1,000 mcg by mouth every morning.   Yes [provider]  warfarin (COUMADIN) 5 MG tablet Take 5 mg by mouth. Take 7.5 mg by mouth every Monday, Wednesday, Friday. Take 5 mg by mouth all other days.    [provider]    Scheduled Meds:  Infusions:  PRN Meds:    Allergies as of 12/05/2018 - Review Complete 12/05/2018  Allergen Reaction Noted  . Demerol [meperidine] Shortness Of Breath and Swelling 09/14/2016  . Oxycodone Other (See Comments) 03/18/2015  . Adhesive [tape] Other (See Comments) 08/28/2016  . Ivp dye [iodinated diagnostic agents] Hives 10/13/2011  . Phenergan [promethazine] Other (See Comments) 09/14/2016    Family History  Problem Relation Age of Onset  . Heart disease Mother   . Heart disease Father 39       CABG  . Other Brother        AVR    Social History   Socioeconomic History  . Marital status: Married    Spouse name: Not on file  . Number of children: 3  . Years of education: 10  . Highest education level: Not on file  Occupational History  . 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 Financial resource strain: Not on file  . Food insecurity:    Worry: Not on file    Inability: Not on file  . Transportation needs:    Medical: Not on file    Non-medical: Not on file  Tobacco Use  . Smoking status: Never Smoker  . Smokeless  tobacco: Never Used  Substance and Sexual Activity  . Alcohol use: No    Alcohol/week: 0.0 standard drinks  . Drug use: No  . Sexual activity: Not on file  Lifestyle  . Physical activity:    Days per week: Not on file    Minutes per session: Not on file  . Stress: Not on file  Relationships  . Social connections:    Talks on phone: Not on file    Gets together: Not on file    Attends religious service: Not on file    Active member of club or organization: Not on file    Attends meetings of clubs or organizations: Not on file    Relationship status: Not on file  . Intimate partner violence:    Fear of current or ex partner: Not on file    Emotionally abused: Not on file    Physically abused: Not on file    Forced sexual activity: Not on file  Other Topics Concern  . Not on file  Social History Narrative      1/2 -1 soda a day      REVIEW OF SYSTEMS: Constitutional: Weakness, fatigue.  Generally unsteady on her feet. ENT:  No nose bleeds Pulm: Feels short of breath. CV: Aware of tachy palpitations, no LE edema.  GU:  No hematuria, no frequency GI:  Per HPI Heme:  Per HPI.  Besides the bruising, she has not noticed any unusual bleeding. Transfusions: Past transfusions in 2012 Neuro:  No headaches, no peripheral tingling or numbness.  No syncope.  Derm:  No itching, no rash or sores.  Endocrine:  No  sweats or chills.  No polyuria or dysuria Immunization:  Not queried.   Travel:  None beyond local counties in last few months.    PHYSICAL EXAM: Vital signs in last 24 hours: Vitals:   12/05/18 1023 12/05/18 1040  BP: 98/71 108/71  Pulse: (!) 121   Resp: (!) 28 (!) 25  Temp: 98.8 F (37.1 C) 98 F (36.7 C)  SpO2: 99% 100%   Wt Readings from Last 3 Encounters:  12/05/18 62.1 kg  11/21/18 62.6 kg  10/09/18 62.8 kg    General: Frail, elderly, alert, pale WF.  Laying comfortably in the bed Head: No facial asymmetry or swelling.  No signs of head trauma. Eyes: No  scleral icterus.  Conjunctiva slightly pale.  EOMI. Ears: Slightly HOH. Nose: No discharge or congestion. Mouth: No blood in the mouth.  Tongue midline.  Oral mucosa pink, moist, clear. Neck: No JVD, no masses, no thyromegaly. Breast.  Postsurgical deformity in the left breast Lungs: Diminished breath sounds in the right base.  A few fine crackles in the left base.  No labored breathing, no cough. Heart: Irregular/irregular.  Tachycardic in the low 100s. Abdomen: See photos entered into Dr. Karmen Bongo history and physical displaying the extent of the hematoma.  On her left side. Rectal: Did not perform.  Stool not melenic, FOBT positive per ED provider. Musc/Skeltl: No gross joint deformities. Extremities: Feet both warm with good pulses.  Stents of hematoma on the left arm Neurologic: Slightly hard of hearing but appropriate, alert and oriented x3.  Moves all 4 limbs without tremor, strength not tested. Skin: Hematomas as described above.  No skin tears, no bleeding dermatologic lesions. Tattoos: None Nodes: No cervical adenopathy. Psych: Calm, cooperative, pleasant.  Intake/Output from previous day: No intake/output data recorded. Intake/Output this shift: Total I/O In: 1000 [IV Piggyback:1000] Out: -   LAB RESULTS: Recent Labs    12/05/18 0715  WBC 8.3  HGB 5.6*  HCT 18.5*  PLT 226   BMET Lab Results  Component Value Date   NA 135 12/05/2018   NA 135 09/21/2016   NA 133 (L) 09/20/2016   K 3.8 12/05/2018   K 3.6 09/21/2016   K 3.8 09/20/2016   CL 97 (L) 12/05/2018   CL 103 09/21/2016   CL 101 09/20/2016   CO2 27 12/05/2018   CO2 25 09/21/2016   CO2 26 09/20/2016   GLUCOSE 110 (H) 12/05/2018   GLUCOSE 124 (H) 09/21/2016   GLUCOSE 92 09/20/2016   BUN 39 (H) 12/05/2018   BUN 17 09/21/2016   BUN 17 09/20/2016   CREATININE 1.83 (H) 12/05/2018   CREATININE 1.58 (H) 09/21/2016   CREATININE 1.60 (H) 09/20/2016   CALCIUM 9.1 12/05/2018   CALCIUM 8.6 (L)  09/21/2016   CALCIUM 8.2 (L) 09/20/2016   LFT No results for input(s): PROT, ALBUMIN, AST, ALT, ALKPHOS, BILITOT, BILIDIR, IBILI in the last 72 hours. PT/INR Lab Results  Component Value Date   INR 3.45 12/05/2018   INR 2.25 02/12/2018   INR 2.52 09/21/2016   Lipase     Component Value Date/Time   LIPASE 19 09/19/2016 0550    Drugs of Abuse  No results found for: LABOPIA, COCAINSCRNUR, LABBENZ, AMPHETMU, THCU, LABBARB   RADIOLOGY STUDIES: Dg Chest Portable 1 View  Result Date: 12/05/2018 CLINICAL DATA:  Chest pain. EXAM: PORTABLE CHEST 1 VIEW COMPARISON:  Radiographs of September 19, 2016. FINDINGS: Stable cardiomediastinal silhouette. Sternotomy wires are noted. No pneumothorax or pleural effusion is noted.  Right lung is clear. Mild left perihilar interstitial opacities are noted concerning for subsegmental atelectasis or possibly infiltrate. Bony thorax is unremarkable. IMPRESSION: Mild left perihilar interstitial opacities are noted concerning for subsegmental atelectasis or possibly pneumonia. Electronically Signed   By: Marijo Conception, M.D.   On: 12/05/2018 09:02     IMPRESSION:   *    Macrocytic anemia.   *   FOBT +, dark stools.     *    AKI vs progression of CKD.    *    Chronic Coumadin for prosthetic AVR and PAF.  Iatrogenic coagulopathy. Currently in rapid a flutter.   INR 3.4.      PLAN:     *   Patient will likely need EGD once her INR is corrected.  Continue ongoing excellent supportive care including transfusion PRBCs, 2 U to start.  IV Protonix 40 mg IV BID.  Serial CBCs.     Azucena Freed  12/05/2018, 11:25 AM Phone [phone]

## 2018-12-05 NOTE — Progress Notes (Signed)
Lab called with critical Troponin of 0.07 at 2035, Schorr, NP notified at 2039. NP also notified of post transfusion hemoglobin 9.4.

## 2018-12-05 NOTE — ED Provider Notes (Signed)
Medical screening examination/treatment/procedure(s) were conducted as a shared visit with non-physician practitioner(s) and myself.  I personally evaluated the patient during the encounter.  EKG Interpretation  Date/Time:  Tuesday December 05 2018 07:54:22 EST Ventricular Rate:  132 PR Interval:    QRS Duration: 139 QT Interval:  366 QTC Calculation: 543 R Axis:   114 Text Interpretation:  Atrial flutter with predominant 2:1 AV block Nonspecific intraventricular conduction delay Anterior infarct, old Borderline repolarization abnormality Confirmed by Sherwood Gambler [phone]) on 12/05/2018 8:48:39 AM   Patient with acute chest pain.  She does not feel like she is in A. fib right now but has a heart rate in the 140s, nearly 150 and is pretty regular.  I question flutter and a repeat EKG does show what is likely atrial flutter with 2-1 AV block.  I did discuss with Dr. Angelena Form who agrees this is likely flutter.  Cards will consult on the patient.  I also discussed with her cardiologist, Dr. Martinique.  He recommends that she could be treated with diltiazem but a lower dose given the soft blood pressures.  She is no longer hypotensive.  However after this conversation her hemoglobin came back at 5.6.  This is likely significantly contributing to her atrial flutter and thus I think giving her blood prior to any rate control would be more beneficial.  I do not think cardioversion would be warranted given she seems to be in A. fib on and off a significant amount.  He did discuss that she will probably need Tikosyn. Hospitalist service will admit.    CRITICAL CARE Performed by: Ephraim Hamburger   Total critical care time: 45 minutes  Critical care time was exclusive of separately billable procedures and treating other patients.  Critical care was necessary to treat or prevent imminent or life-threatening deterioration.  Critical care was time spent personally by me on the following activities:  development of treatment plan with patient and/or surrogate as well as nursing, discussions with consultants, evaluation of patient's response to treatment, examination of patient, obtaining history from patient or surrogate, ordering and performing treatments and interventions, ordering and review of laboratory studies, ordering and review of radiographic studies, pulse oximetry and re-evaluation of patient's condition.    Sherwood Gambler, MD 12/05/18 [phone]

## 2018-12-06 DIAGNOSIS — Z952 Presence of prosthetic heart valve: Secondary | ICD-10-CM

## 2018-12-06 DIAGNOSIS — K922 Gastrointestinal hemorrhage, unspecified: Secondary | ICD-10-CM

## 2018-12-06 DIAGNOSIS — I10 Essential (primary) hypertension: Secondary | ICD-10-CM

## 2018-12-06 DIAGNOSIS — J9601 Acute respiratory failure with hypoxia: Secondary | ICD-10-CM

## 2018-12-06 DIAGNOSIS — R195 Other fecal abnormalities: Secondary | ICD-10-CM

## 2018-12-06 DIAGNOSIS — I4892 Unspecified atrial flutter: Secondary | ICD-10-CM

## 2018-12-06 DIAGNOSIS — N179 Acute kidney failure, unspecified: Secondary | ICD-10-CM

## 2018-12-06 LAB — CBC
HCT: 28.3 % — ABNORMAL LOW (ref 36.0–46.0)
Hemoglobin: 8.9 g/dL — ABNORMAL LOW (ref 12.0–15.0)
MCH: 30.4 pg (ref 26.0–34.0)
MCHC: 31.4 g/dL (ref 30.0–36.0)
MCV: 96.6 fL (ref 80.0–100.0)
Platelets: 220 10*3/uL (ref 150–400)
RBC: 2.93 MIL/uL — ABNORMAL LOW (ref 3.87–5.11)
RDW: 18.7 % — ABNORMAL HIGH (ref 11.5–15.5)
WBC: 6.3 10*3/uL (ref 4.0–10.5)
nRBC: 0 % (ref 0.0–0.2)

## 2018-12-06 LAB — PROTIME-INR
INR: 3.37
Prothrombin Time: 33.6 seconds — ABNORMAL HIGH (ref 11.4–15.2)

## 2018-12-06 LAB — BASIC METABOLIC PANEL
Anion gap: 13 (ref 5–15)
BUN: 29 mg/dL — ABNORMAL HIGH (ref 8–23)
CO2: 26 mmol/L (ref 22–32)
Calcium: 8.2 mg/dL — ABNORMAL LOW (ref 8.9–10.3)
Chloride: 97 mmol/L — ABNORMAL LOW (ref 98–111)
Creatinine, Ser: 1.52 mg/dL — ABNORMAL HIGH (ref 0.44–1.00)
GFR calc Af Amer: 38 mL/min — ABNORMAL LOW (ref >60)
GFR calc non Af Amer: 32 mL/min — ABNORMAL LOW (ref >60)
Glucose, Bld: 98 mg/dL (ref 70–99)
Potassium: 2.9 mmol/L — ABNORMAL LOW (ref 3.5–5.1)
Sodium: 136 mmol/L (ref 135–145)

## 2018-12-06 MED ORDER — POTASSIUM CHLORIDE 10 MEQ/100ML IV SOLN
10.0000 meq | INTRAVENOUS | Status: AC
  Administered 2018-12-06 (×3): 10 meq via INTRAVENOUS
  Filled 2018-12-06 (×3): qty 100

## 2018-12-06 MED ORDER — CHLORHEXIDINE GLUCONATE 0.12 % MT SOLN
15.0000 mL | Freq: Two times a day (BID) | OROMUCOSAL | Status: DC
  Administered 2018-12-06 – 2018-12-09 (×8): 15 mL via OROMUCOSAL
  Filled 2018-12-06 (×7): qty 15

## 2018-12-06 MED ORDER — ORAL CARE MOUTH RINSE
15.0000 mL | Freq: Two times a day (BID) | OROMUCOSAL | Status: DC
  Administered 2018-12-06 – 2018-12-08 (×5): 15 mL via OROMUCOSAL

## 2018-12-06 MED ORDER — FUROSEMIDE 10 MG/ML IJ SOLN
60.0000 mg | Freq: Once | INTRAMUSCULAR | Status: AC
  Administered 2018-12-06: 60 mg via INTRAVENOUS
  Filled 2018-12-06: qty 6

## 2018-12-06 MED ORDER — HYDROCODONE-ACETAMINOPHEN 5-325 MG PO TABS
2.0000 | ORAL_TABLET | Freq: Once | ORAL | Status: DC
  Filled 2018-12-06: qty 2

## 2018-12-06 NOTE — Progress Notes (Signed)
Daily Rounding Note  12/06/2018, 9:46 AM  LOS: 1 day   SUBJECTIVE:   Chief complaint:     Rapid response team initiated bipap for resp distress from flash pulmonary edema.  OBJECTIVE:         Vital signs in last 24 hours:    Temp:  [98 F (36.7 C)-99.1 F (37.3 C)] 98.4 F (36.9 C) (12/11 0444) Pulse Rate:  [70-138] 79 (12/10 1646) Resp:  [20-41] 23 (12/11 0453) BP: (92-138)/(56-78) 113/76 (12/11 0453) SpO2:  [90 %-100 %] 98 % (12/11 0055) Last BM Date: 12/04/18 Filed Weights   12/05/18 0719  Weight: 62.1 kg   General: Patient currently on full face BiPAP, alert but not able to speak. Heart: irregularly iregular.  Vent rhythm in 50s to 60s on tele.  Chest: crackles bil.  Labored breathing Abdomen: soft, NT, ND.    Extremities: no CCE Neuro/Psych:  Pleasant, follows commands, appropriate.    Intake/Output from previous day: 12/10 0701 - 12/11 0700 In: 1750 [P.O.:120; Blood:630; IV Piggyback:1000] Out: 1850 [Urine:1850]  Intake/Output this shift: No intake/output data recorded.  Lab Results: Recent Labs    12/05/18 0715 12/05/18 1854 12/06/18 0739  WBC 8.3 8.0 6.3  HGB 5.6* 9.4* 8.9*  HCT 18.5* 29.3* 28.3*  PLT 226 221 220   BMET Recent Labs    12/05/18 0715 12/06/18 0739  NA 135 136  K 3.8 2.9*  CL 97* 97*  CO2 27 26  GLUCOSE 110* 98  BUN 39* 29*  CREATININE 1.83* 1.52*  CALCIUM 9.1 8.2*   LFT No results for input(s): PROT, ALBUMIN, AST, ALT, ALKPHOS, BILITOT, BILIDIR, IBILI in the last 72 hours. PT/INR Recent Labs    12/05/18 1854 12/06/18 0739  LABPROT 28.7* 33.6*  INR 2.75 3.37   Hepatitis Panel No results for input(s): HEPBSAG, HCVAB, HEPAIGM, HEPBIGM in the last 72 hours.  Studies/Results: Ct Abdomen Pelvis Wo Contrast Ct Chest Wo Contrast  Result Date: 12/05/2018 CLINICAL DATA:  Chest pain and chronic shortness of breath. Minor blunt abdominal trauma. EXAM: CT CHEST,  ABDOMEN AND PELVIS WITHOUT CONTRAST TECHNIQUE: Multidetector CT imaging of the chest, abdomen and pelvis was performed following the standard protocol without IV contrast. COMPARISON:  Portable chest obtained earlier today. Chest and abdomen radiographs dated 09/19/2016. Abdomen and pelvis CT dated 08/10/2016. FINDINGS: CT CHEST FINDINGS Cardiovascular: Atheromatous calcifications, including the coronary arteries and aorta. Post CABG changes. Borderline enlarged heart. Dense mitral valve annulus calcifications. Mediastinum/Nodes: No enlarged mediastinal, hilar, or axillary lymph nodes. Thyroid gland, trachea, and esophagus demonstrate no significant findings. A moderately large hiatal hernia is noted. Lungs/Pleura: Small bilateral pleural effusions. Interstitial thickening throughout the majority of both lungs. Seven by 6 mm nodule in the posterolateral aspect of the left lower lobe on image number 114 series 5, confirmed on the sagittal and coronal reconstruction images. This is unchanged since 08/10/2016. No new nodules are seen. Musculoskeletal: Thoracic and lower cervical spine degenerative changes. CT ABDOMEN PELVIS FINDINGS Hepatobiliary: No focal liver abnormality is seen. No gallstones, gallbladder wall thickening, or biliary dilatation. Pancreas: Diffusely atrophied. Spleen: Normal in size without focal abnormality. Adrenals/Urinary Tract: Several left renal calculi measuring up to 11 mm in maximum diameter each. Tiny mid upper right renal calculus on coronal image number 68 series 6. Mild dilatation of the right renal collecting system and extrarenal pelvis with progression. Mildly dilated right ureter to the level of the ureterovesical junction with no obstructing calculus seen. Interval  mild dilatation of the left renal collecting system and ureter to the level of the ureterovesical junction with no obstructing calculus seen. Mildly distended urinary bladder. Stomach/Bowel: Moderately large hiatal  hernia. Multiple colonic diverticula without evidence of diverticulitis. Normal appearing appendix and small bowel. Vascular/Lymphatic: Atheromatous arterial calcifications without aneurysm. No enlarged lymph nodes. Reproductive: Status post hysterectomy. No adnexal masses. Other: Left lateral subcutaneous edema and skin thickening. Tiny umbilical hernia containing fat. Musculoskeletal: Stable lumbar spine degenerative changes and scoliosis, including facet degenerative changes with associated grade 1 anterolisthesis at the L4-5 level, without significant change. No fractures or pars defects are seen. IMPRESSION: 1. Left lateral subcutaneous edema and skin thickening compatible with bruising. 2. Small bilateral pleural effusions. 3. Changes of congestive heart failure with interstitial pulmonary edema. 4. Stable 6 mm left lower lobe nodule. The long-term stability is compatible with a benign process. 5. Bilateral nonobstructing renal calculi. 6. Interval mild bilateral hydronephrosis and hydroureter to the level of the ureterovesical junction, without obstructing calculus seen. This is most likely physiological due to bladder distention. 7. Moderately large hiatal hernia. 8. Colonic diverticulosis. Aortic Atherosclerosis (ICD10-I70.0). Electronically Signed   By: Claudie Revering M.D.   On: 12/05/2018 20:38   Dg Chest Portable 1 View  Result Date: 12/05/2018 CLINICAL DATA:  Chest pain. EXAM: PORTABLE CHEST 1 VIEW COMPARISON:  Radiographs of September 19, 2016. FINDINGS: Stable cardiomediastinal silhouette. Sternotomy wires are noted. No pneumothorax or pleural effusion is noted. Right lung is clear. Mild left perihilar interstitial opacities are noted concerning for subsegmental atelectasis or possibly infiltrate. Bony thorax is unremarkable. IMPRESSION: Mild left perihilar interstitial opacities are noted concerning for subsegmental atelectasis or possibly pneumonia. Electronically Signed   By: Marijo Conception,  M.D.   On: 12/05/2018 09:02    ASSESMENT:   *  Macrocytic anemia.  S/p PRBCs x 2 U, Hgb 5.6 >> 8.9.  MCV 107 >> 96 after transfusion.     *    FOBT + dark stools.   Empiric BID, IV Protonix in place. Last colonoscopy/EGD 2015.  Hx colon polyps, sigmoid diverticulosis, esophageal stenosis, large HH.   Large HH on chest CT, ? camerons lesions?  *  Massive hematoma to left arm and torso post fall 10 D PTA.  C to anemia.   *   Chronic Coumadin.  Supratherapeutic and rising INR despiote med on hold.  S/p remote prosthetic MVR and PAF.    *    Rapid atrial flutter,   *   Hypokalemia.    *   AKI, improved.  Baseline CKD 3.     PLAN   *  EGD when pt stable.  Not sure she would tolerate colonoscopy and prep.  Ok to have solid food, when bipap removed.  Azucena Freed  12/06/2018, 9:46 AM Phone [phone]

## 2018-12-06 NOTE — Progress Notes (Signed)
Pt on nasal cannula RR in range 20-24, other VS stable, pt sleeping.

## 2018-12-06 NOTE — Progress Notes (Signed)
PROGRESS NOTE        PATIENT DETAILS Name: [name] Age: 77 y.o. Sex: female Date of Birth: [birth year] Admit Date: 12/05/2018 Admitting Physician Karmen Bongo, MD [id number], Quillian Quince, MD  Brief Narrative: Patient is a 77 y.o. female with history of A. fib on anticoagulation, chronic systolic heart failure, aortic valve Parkinson's disease-with recent history of mechanical fall complicated by significant bruising of her left torso area-presented to the hospital for evaluation of chest pain, she was found to have a hemoglobin of 5.6.  There was some concern for recent GI bleeding-she was transfused 2 units of PRBC and admitted to the hospitalist service.  Hospital course further complicated by development of acute hypoxic respiratory failure in the setting of pulmonary edema requiring BiPAP.  See below for further details  Subjective: Required BiPAP last night-she was liberated off BiPAP but did redevelop shortness of breath this morning and was placed back on BiPAP.  Denies any chest pain.  Assessment/Plan: Acute hypoxic respiratory failure secondary to pulmonary edema/acute on chronic systolic heart failure: In the setting of blood transfusion-do not think clinical situation/chest x-ray is consistent with TRALI-suspect this is probably pulmonary edema causing respiratory failure.  Lasix 60 mg x 1-following which we will attempt to liberate off BiPAP later today.  Anemia: Suspect has chronic anemia at baseline-drop in hemoglobin probably is multifactorial with some element likely from significant ecchymosis in the subcutaneous tissue following her mechanical fall-but there is some concern for acute blood loss from a subtle GI bleeding.  Has been transfused 2 units of PRBC-she is currently stable-she denies any melena or hematochezia this morning.  Follow CBC daily unless she develops overt GI bleeding-then she would require more frequently.  ?  GI  bleeding: Concern for upper GI bleeding-some melanotic stools over the past few weeks-suspect that this may be playing a role in her anemia (see above).  GI following-plans for endoscopic evaluation once her respiratory failure has improved further.  A. fib with RVR: Heart rate has improved-rate is much better controlled-after PRBC transfusion-not on any rate control agents at this time-Coumadin remains on hold until endoscopic evaluation has been completed.  Mechanical aortic valve replacement: Coumadin remains on hold and INR at 3.7 this morning-plans are for endoscopic evaluation before resumption of Coumadin.  Elevated troponin: Trend is flat and not consistent with ACS-cardiology does not recommend any further ischemic evaluation at this time.  AKI on CKD stage III: AKI likely hemodynamically mediated-improved with PRBC transfusion-creatinine close to usual baseline-follow.  Parkinson's disease: Appears stable-continue Sinemet.  She may have autonomic dysfunction associated with Parkinson's disease contributing to her recent falls.  Recent mechanical fall: Suspect she may have autonomic dysfunction associated with Parkinson's disease-when she is more stable she will require PT evaluation.  Dyslipidemia: Resume statin over the next few days.  Chronic debility/deconditioning: Secondary to Parkinson's disease-may have worsened due to acute illness-plans are for PT evaluation once she is more stable.  DVT Prophylaxis: Coumadin on hold-INR still slightly supratherapeutic  Code Status: Full code  Family Communication: Spouse at bedside  Disposition Plan: Remain inpatient-will require several more days of hospitalization before consideration of discharge  Antimicrobial agents: Anti-infectives (From admission, onward)   None      Procedures: None  CONSULTS:  cardiology and GI  Time spent: 55 minutes-Greater than 50% of this time was spent in counseling,  explanation of  diagnosis, planning of further management, and coordination of care.  The patient is critically ill with multiple organ system failure and requires high complexity decision making for assessment and support, frequent evaluation and titration of therapies, advanced monitoring, review of radiographic studies and interpretation of complex data.   MEDICATIONS: Scheduled Meds: . carbidopa-levodopa  2 tablet Oral TID  . chlorhexidine  15 mL Mouth Rinse BID  . mouth rinse  15 mL Mouth Rinse q12n4p  . pantoprazole (PROTONIX) IV  40 mg Intravenous Q12H   Continuous Infusions: . potassium chloride 10 mEq (12/06/18 1041)   PRN Meds:.acetaminophen, ondansetron (ZOFRAN) IV   PHYSICAL EXAM: Vital signs: Vitals:   12/05/18 2225 12/06/18 0055 12/06/18 0444 12/06/18 0453  BP: 123/70 112/68  113/76  Pulse:      Resp: (!) 39 (!) 26  (!) 23  Temp:  98.6 F (37 C) 98.4 F (36.9 C)   TempSrc:  Oral    SpO2: 91% 98%    Weight:      Height:       Filed Weights   12/05/18 0719  Weight: 62.1 kg   Body mass index is 27.67 kg/m.   General appearance :Awake, alert, appears comfortable on BiPAP Eyes:.Pink conjunctiva HEENT: Atraumatic and Normocephalic Neck: supple Resp:Good air entry bilaterally, bibasilar rales CVS: S1 S2 irregular, 3/6 systolic murmur GI: Bowel sounds present, Non tender and not distended with no gaurding, rigidity or rebound.No organomegaly Extremities: B/L Lower Ext shows no edema, both legs are warm to touch Neurology:  speech clear,Non focal, sensation is grossly intact. Psychiatric: Normal judgment and insight. Alert and oriented x 3. Normal mood. Musculoskeletal:No digital cyanosis Skin:No Rash, warm and dry Wounds:N/A  I have personally reviewed following labs and imaging studies  LABORATORY DATA: CBC: Recent Labs  Lab 12/05/18 0715 12/05/18 1854 12/06/18 0739  WBC 8.3 8.0 6.3  NEUTROABS 5.7  --   --   HGB 5.6* 9.4* 8.9*  HCT 18.5* 29.3* 28.3*  MCV  107.6* 96.4 96.6  PLT 226 221 678    Basic Metabolic Panel: Recent Labs  Lab 12/05/18 0715 12/05/18 0850 12/06/18 0739  NA 135  --  136  K 3.8  --  2.9*  CL 97*  --  97*  CO2 27  --  26  GLUCOSE 110*  --  98  BUN 39*  --  29*  CREATININE 1.83*  --  1.52*  CALCIUM 9.1  --  8.2*  MG  --  1.7  --     GFR: Estimated Creatinine Clearance: 24.5 mL/min (A) (by C-G formula based on SCr of 1.52 mg/dL (H)).  Liver Function Tests: No results for input(s): AST, ALT, ALKPHOS, BILITOT, PROT, ALBUMIN in the last 168 hours. No results for input(s): LIPASE, AMYLASE in the last 168 hours. No results for input(s): AMMONIA in the last 168 hours.  Coagulation Profile: Recent Labs  Lab 12/05/18 0715 12/05/18 1854 12/06/18 0739  INR 3.45 2.75 3.37    Cardiac Enzymes: Recent Labs  Lab 12/05/18 1854  TROPONINI 0.07*    BNP (last 3 results) No results for input(s): PROBNP in the last 8760 hours.  HbA1C: No results for input(s): HGBA1C in the last 72 hours.  CBG: No results for input(s): GLUCAP in the last 168 hours.  Lipid Profile: No results for input(s): CHOL, HDL, LDLCALC, TRIG, CHOLHDL, LDLDIRECT in the last 72 hours.  Thyroid Function Tests: Recent Labs    12/05/18 0850  TSH 1.304  Anemia Panel: No results for input(s): VITAMINB12, FOLATE, FERRITIN, TIBC, IRON, RETICCTPCT in the last 72 hours.  Urine analysis:    Component Value Date/Time   COLORURINE RED (A) 09/19/2016 0608   APPEARANCEUR TURBID (A) 09/19/2016 0608   LABSPEC 1.020 09/19/2016 0608   PHURINE 7.5 09/19/2016 0608   GLUCOSEU 100 (A) 09/19/2016 0608   HGBUR LARGE (A) 09/19/2016 0608   BILIRUBINUR MODERATE (A) 09/19/2016 0608   KETONESUR 15 (A) 09/19/2016 0608   PROTEINUR 100 (A) 09/19/2016 0608   NITRITE POSITIVE (A) 09/19/2016 0608   LEUKOCYTESUR SMALL (A) 09/19/2016 0608    Sepsis Labs: Lactic Acid, Venous No results found for: LATICACIDVEN  MICROBIOLOGY: Recent Results (from the past  240 hour(s))  MRSA PCR Screening     Status: None   Collection Time: 12/05/18  3:13 PM  Result Value Ref Range Status   MRSA by PCR NEGATIVE NEGATIVE Final    Comment:        The GeneXpert MRSA Assay (FDA approved for NASAL specimens only), is one component of a comprehensive MRSA colonization surveillance program. It is not intended to diagnose MRSA infection nor to guide or monitor treatment for MRSA infections. Performed at [facility], [place] [address], [place], [place] [address]     RADIOLOGY STUDIES/RESULTS: Ct Abdomen Pelvis Wo Contrast  Result Date: 12/05/2018 CLINICAL DATA:  Chest pain and chronic shortness of breath. Minor blunt abdominal trauma. EXAM: CT CHEST, ABDOMEN AND PELVIS WITHOUT CONTRAST TECHNIQUE: Multidetector CT imaging of the chest, abdomen and pelvis was performed following the standard protocol without IV contrast. COMPARISON:  Portable chest obtained earlier today. Chest and abdomen radiographs dated 09/19/2016. Abdomen and pelvis CT dated 08/10/2016. FINDINGS: CT CHEST FINDINGS Cardiovascular: Atheromatous calcifications, including the coronary arteries and aorta. Post CABG changes. Borderline enlarged heart. Dense mitral valve annulus calcifications. Mediastinum/Nodes: No enlarged mediastinal, hilar, or axillary lymph nodes. Thyroid gland, trachea, and esophagus demonstrate no significant findings. A moderately large hiatal hernia is noted. Lungs/Pleura: Small bilateral pleural effusions. Interstitial thickening throughout the majority of both lungs. Seven by 6 mm nodule in the posterolateral aspect of the left lower lobe on image number 114 series 5, confirmed on the sagittal and coronal reconstruction images. This is unchanged since 08/10/2016. No new nodules are seen. Musculoskeletal: Thoracic and lower cervical spine degenerative changes. CT ABDOMEN PELVIS FINDINGS Hepatobiliary: No focal liver abnormality is seen. No gallstones, gallbladder wall  thickening, or biliary dilatation. Pancreas: Diffusely atrophied. Spleen: Normal in size without focal abnormality. Adrenals/Urinary Tract: Several left renal calculi measuring up to 11 mm in maximum diameter each. Tiny mid upper right renal calculus on coronal image number 68 series 6. Mild dilatation of the right renal collecting system and extrarenal pelvis with progression. Mildly dilated right ureter to the level of the ureterovesical junction with no obstructing calculus seen. Interval mild dilatation of the left renal collecting system and ureter to the level of the ureterovesical junction with no obstructing calculus seen. Mildly distended urinary bladder. Stomach/Bowel: Moderately large hiatal hernia. Multiple colonic diverticula without evidence of diverticulitis. Normal appearing appendix and small bowel. Vascular/Lymphatic: Atheromatous arterial calcifications without aneurysm. No enlarged lymph nodes. Reproductive: Status post hysterectomy. No adnexal masses. Other: Left lateral subcutaneous edema and skin thickening. Tiny umbilical hernia containing fat. Musculoskeletal: Stable lumbar spine degenerative changes and scoliosis, including facet degenerative changes with associated grade 1 anterolisthesis at the L4-5 level, without significant change. No fractures or pars defects are seen. IMPRESSION: 1. Left lateral subcutaneous edema and  skin thickening compatible with bruising. 2. Small bilateral pleural effusions. 3. Changes of congestive heart failure with interstitial pulmonary edema. 4. Stable 6 mm left lower lobe nodule. The long-term stability is compatible with a benign process. 5. Bilateral nonobstructing renal calculi. 6. Interval mild bilateral hydronephrosis and hydroureter to the level of the ureterovesical junction, without obstructing calculus seen. This is most likely physiological due to bladder distention. 7. Moderately large hiatal hernia. 8. Colonic diverticulosis. Aortic  Atherosclerosis (ICD10-I70.0). Electronically Signed   By: Claudie Revering M.D.   On: 12/05/2018 20:38   Ct Chest Wo Contrast  Result Date: 12/05/2018 CLINICAL DATA:  Chest pain and chronic shortness of breath. Minor blunt abdominal trauma. EXAM: CT CHEST, ABDOMEN AND PELVIS WITHOUT CONTRAST TECHNIQUE: Multidetector CT imaging of the chest, abdomen and pelvis was performed following the standard protocol without IV contrast. COMPARISON:  Portable chest obtained earlier today. Chest and abdomen radiographs dated 09/19/2016. Abdomen and pelvis CT dated 08/10/2016. FINDINGS: CT CHEST FINDINGS Cardiovascular: Atheromatous calcifications, including the coronary arteries and aorta. Post CABG changes. Borderline enlarged heart. Dense mitral valve annulus calcifications. Mediastinum/Nodes: No enlarged mediastinal, hilar, or axillary lymph nodes. Thyroid gland, trachea, and esophagus demonstrate no significant findings. A moderately large hiatal hernia is noted. Lungs/Pleura: Small bilateral pleural effusions. Interstitial thickening throughout the majority of both lungs. Seven by 6 mm nodule in the posterolateral aspect of the left lower lobe on image number 114 series 5, confirmed on the sagittal and coronal reconstruction images. This is unchanged since 08/10/2016. No new nodules are seen. Musculoskeletal: Thoracic and lower cervical spine degenerative changes. CT ABDOMEN PELVIS FINDINGS Hepatobiliary: No focal liver abnormality is seen. No gallstones, gallbladder wall thickening, or biliary dilatation. Pancreas: Diffusely atrophied. Spleen: Normal in size without focal abnormality. Adrenals/Urinary Tract: Several left renal calculi measuring up to 11 mm in maximum diameter each. Tiny mid upper right renal calculus on coronal image number 68 series 6. Mild dilatation of the right renal collecting system and extrarenal pelvis with progression. Mildly dilated right ureter to the level of the ureterovesical junction with  no obstructing calculus seen. Interval mild dilatation of the left renal collecting system and ureter to the level of the ureterovesical junction with no obstructing calculus seen. Mildly distended urinary bladder. Stomach/Bowel: Moderately large hiatal hernia. Multiple colonic diverticula without evidence of diverticulitis. Normal appearing appendix and small bowel. Vascular/Lymphatic: Atheromatous arterial calcifications without aneurysm. No enlarged lymph nodes. Reproductive: Status post hysterectomy. No adnexal masses. Other: Left lateral subcutaneous edema and skin thickening. Tiny umbilical hernia containing fat. Musculoskeletal: Stable lumbar spine degenerative changes and scoliosis, including facet degenerative changes with associated grade 1 anterolisthesis at the L4-5 level, without significant change. No fractures or pars defects are seen. IMPRESSION: 1. Left lateral subcutaneous edema and skin thickening compatible with bruising. 2. Small bilateral pleural effusions. 3. Changes of congestive heart failure with interstitial pulmonary edema. 4. Stable 6 mm left lower lobe nodule. The long-term stability is compatible with a benign process. 5. Bilateral nonobstructing renal calculi. 6. Interval mild bilateral hydronephrosis and hydroureter to the level of the ureterovesical junction, without obstructing calculus seen. This is most likely physiological due to bladder distention. 7. Moderately large hiatal hernia. 8. Colonic diverticulosis. Aortic Atherosclerosis (ICD10-I70.0). Electronically Signed   By: Claudie Revering M.D.   On: 12/05/2018 20:38   Dg Chest Portable 1 View  Result Date: 12/05/2018 CLINICAL DATA:  Chest pain. EXAM: PORTABLE CHEST 1 VIEW COMPARISON:  Radiographs of September 19, 2016. FINDINGS: Stable cardiomediastinal silhouette. Sternotomy  wires are noted. No pneumothorax or pleural effusion is noted. Right lung is clear. Mild left perihilar interstitial opacities are noted concerning for  subsegmental atelectasis or possibly infiltrate. Bony thorax is unremarkable. IMPRESSION: Mild left perihilar interstitial opacities are noted concerning for subsegmental atelectasis or possibly pneumonia. Electronically Signed   By: Marijo Conception, M.D.   On: 12/05/2018 09:02     LOS: 1 day   Oren Binet, MD  [facility]  If 7PM-7AM, please contact night-coverage  Please page via [email or web address]-Password [email or web address]-click on MD name and type text message  12/06/2018, 12:02 PM

## 2018-12-06 NOTE — Progress Notes (Signed)
Pt returned from CT, on fallowing assessment RN noted moderate respiratory distress. Pt alert and oriented x 4, RR in high 40s, BP, HR, Temp and SpO2 stable. Pt using accessory muscles to breath and unable to speak more then 2-3 words due to SOB. Lung sounds diminished in all lobes. Per family, her condition is worse than today. Rapid Response RN and Respiratory notified.  On call NP consulted and pt placed on BiPAP. Lasix 40mg  IV given as ordered.  Will continue to monitor.

## 2018-12-06 NOTE — Progress Notes (Signed)
Progress Note  Patient Name: [name] Date of Encounter: 12/06/2018  Primary Cardiologist: Peter Martinique, MD   Subjective   Reports feeling better overall than initial presentation. Still with some SOB requiring intermittent BiPAP but she is hopeful to transition to O2 via [place] today. She denies chest pain or palpitations.   Inpatient Medications    Scheduled Meds: . carbidopa-levodopa  2 tablet Oral TID  . chlorhexidine  15 mL Mouth Rinse BID  . mouth rinse  15 mL Mouth Rinse q12n4p  . pantoprazole (PROTONIX) IV  40 mg Intravenous Q12H   Continuous Infusions: . potassium chloride 10 mEq (12/06/18 1041)   PRN Meds: acetaminophen, ondansetron (ZOFRAN) IV   Vital Signs    Vitals:   12/05/18 2225 12/06/18 0055 12/06/18 0444 12/06/18 0453  BP: 123/70 112/68  113/76  Pulse:      Resp: (!) 39 (!) 26  (!) 23  Temp:  98.6 F (37 C) 98.4 F (36.9 C)   TempSrc:  Oral    SpO2: 91% 98%    Weight:      Height:        Intake/Output Summary (Last 24 hours) at 12/06/2018 1139 Last data filed at 12/06/2018 0800 Gross per 24 hour  Intake 750 ml  Output 1850 ml  Net -1100 ml   Filed Weights   12/05/18 0719  Weight: 62.1 kg    Telemetry    Intermittent episodes of atrial fibrillation/flutter with mostly CVR and sinus rhythm this morning. - Personally Reviewed   Physical Exam   GEN: Laying in bed with BiPAP mask on in no acute distress.   Neck: No JVD, no carotid bruits Cardiac: IRIR, no murmurs, rubs, or gallops.  Respiratory: crackles at lung bases; no wheezing or rhonchi GI: NABS, Soft, nontender, non-distended  MS: No edema; No deformity. Neuro:  Nonfocal, moving all extremities spontaneously Psych: Normal affect   Labs    Chemistry Recent Labs  Lab 12/05/18 0715 12/06/18 0739  NA 135 136  K 3.8 2.9*  CL 97* 97*  CO2 27 26  GLUCOSE 110* 98  BUN 39* 29*  CREATININE 1.83* 1.52*  CALCIUM 9.1 8.2*  GFRNONAA NOT CALCULATED 32*  GFRAA NOT  CALCULATED 38*  ANIONGAP 11 13     Hematology Recent Labs  Lab 12/05/18 0715 12/05/18 1854 12/06/18 0739  WBC 8.3 8.0 6.3  RBC 1.72* 3.04* 2.93*  HGB 5.6* 9.4* 8.9*  HCT 18.5* 29.3* 28.3*  MCV 107.6* 96.4 96.6  MCH 32.6 30.9 30.4  MCHC 30.3 32.1 31.4  RDW 15.0 18.7* 18.7*  PLT 226 221 220    Cardiac Enzymes Recent Labs  Lab 12/05/18 1854  TROPONINI 0.07*    Recent Labs  Lab 12/05/18 0732  TROPIPOC 0.05     BNPNo results for input(s): BNP, PROBNP in the last 168 hours.   DDimer  Recent Labs  Lab 12/05/18 0715  DDIMER 3.90*     Radiology    Ct Abdomen Pelvis Wo Contrast  Result Date: 12/05/2018 CLINICAL DATA:  Chest pain and chronic shortness of breath. Minor blunt abdominal trauma. EXAM: CT CHEST, ABDOMEN AND PELVIS WITHOUT CONTRAST TECHNIQUE: Multidetector CT imaging of the chest, abdomen and pelvis was performed following the standard protocol without IV contrast. COMPARISON:  Portable chest obtained earlier today. Chest and abdomen radiographs dated 09/19/2016. Abdomen and pelvis CT dated 08/10/2016. FINDINGS: CT CHEST FINDINGS Cardiovascular: Atheromatous calcifications, including the coronary arteries and aorta. Post CABG changes. Borderline enlarged heart. Dense mitral  valve annulus calcifications. Mediastinum/Nodes: No enlarged mediastinal, hilar, or axillary lymph nodes. Thyroid gland, trachea, and esophagus demonstrate no significant findings. A moderately large hiatal hernia is noted. Lungs/Pleura: Small bilateral pleural effusions. Interstitial thickening throughout the majority of both lungs. Seven by 6 mm nodule in the posterolateral aspect of the left lower lobe on image number 114 series 5, confirmed on the sagittal and coronal reconstruction images. This is unchanged since 08/10/2016. No new nodules are seen. Musculoskeletal: Thoracic and lower cervical spine degenerative changes. CT ABDOMEN PELVIS FINDINGS Hepatobiliary: No focal liver abnormality is  seen. No gallstones, gallbladder wall thickening, or biliary dilatation. Pancreas: Diffusely atrophied. Spleen: Normal in size without focal abnormality. Adrenals/Urinary Tract: Several left renal calculi measuring up to 11 mm in maximum diameter each. Tiny mid upper right renal calculus on coronal image number 68 series 6. Mild dilatation of the right renal collecting system and extrarenal pelvis with progression. Mildly dilated right ureter to the level of the ureterovesical junction with no obstructing calculus seen. Interval mild dilatation of the left renal collecting system and ureter to the level of the ureterovesical junction with no obstructing calculus seen. Mildly distended urinary bladder. Stomach/Bowel: Moderately large hiatal hernia. Multiple colonic diverticula without evidence of diverticulitis. Normal appearing appendix and small bowel. Vascular/Lymphatic: Atheromatous arterial calcifications without aneurysm. No enlarged lymph nodes. Reproductive: Status post hysterectomy. No adnexal masses. Other: Left lateral subcutaneous edema and skin thickening. Tiny umbilical hernia containing fat. Musculoskeletal: Stable lumbar spine degenerative changes and scoliosis, including facet degenerative changes with associated grade 1 anterolisthesis at the L4-5 level, without significant change. No fractures or pars defects are seen. IMPRESSION: 1. Left lateral subcutaneous edema and skin thickening compatible with bruising. 2. Small bilateral pleural effusions. 3. Changes of congestive heart failure with interstitial pulmonary edema. 4. Stable 6 mm left lower lobe nodule. The long-term stability is compatible with a benign process. 5. Bilateral nonobstructing renal calculi. 6. Interval mild bilateral hydronephrosis and hydroureter to the level of the ureterovesical junction, without obstructing calculus seen. This is most likely physiological due to bladder distention. 7. Moderately large hiatal hernia. 8.  Colonic diverticulosis. Aortic Atherosclerosis (ICD10-I70.0). Electronically Signed   By: Claudie Revering M.D.   On: 12/05/2018 20:38   Ct Chest Wo Contrast  Result Date: 12/05/2018 CLINICAL DATA:  Chest pain and chronic shortness of breath. Minor blunt abdominal trauma. EXAM: CT CHEST, ABDOMEN AND PELVIS WITHOUT CONTRAST TECHNIQUE: Multidetector CT imaging of the chest, abdomen and pelvis was performed following the standard protocol without IV contrast. COMPARISON:  Portable chest obtained earlier today. Chest and abdomen radiographs dated 09/19/2016. Abdomen and pelvis CT dated 08/10/2016. FINDINGS: CT CHEST FINDINGS Cardiovascular: Atheromatous calcifications, including the coronary arteries and aorta. Post CABG changes. Borderline enlarged heart. Dense mitral valve annulus calcifications. Mediastinum/Nodes: No enlarged mediastinal, hilar, or axillary lymph nodes. Thyroid gland, trachea, and esophagus demonstrate no significant findings. A moderately large hiatal hernia is noted. Lungs/Pleura: Small bilateral pleural effusions. Interstitial thickening throughout the majority of both lungs. Seven by 6 mm nodule in the posterolateral aspect of the left lower lobe on image number 114 series 5, confirmed on the sagittal and coronal reconstruction images. This is unchanged since 08/10/2016. No new nodules are seen. Musculoskeletal: Thoracic and lower cervical spine degenerative changes. CT ABDOMEN PELVIS FINDINGS Hepatobiliary: No focal liver abnormality is seen. No gallstones, gallbladder wall thickening, or biliary dilatation. Pancreas: Diffusely atrophied. Spleen: Normal in size without focal abnormality. Adrenals/Urinary Tract: Several left renal calculi measuring up to 11 mm  in maximum diameter each. Tiny mid upper right renal calculus on coronal image number 68 series 6. Mild dilatation of the right renal collecting system and extrarenal pelvis with progression. Mildly dilated right ureter to the level of the  ureterovesical junction with no obstructing calculus seen. Interval mild dilatation of the left renal collecting system and ureter to the level of the ureterovesical junction with no obstructing calculus seen. Mildly distended urinary bladder. Stomach/Bowel: Moderately large hiatal hernia. Multiple colonic diverticula without evidence of diverticulitis. Normal appearing appendix and small bowel. Vascular/Lymphatic: Atheromatous arterial calcifications without aneurysm. No enlarged lymph nodes. Reproductive: Status post hysterectomy. No adnexal masses. Other: Left lateral subcutaneous edema and skin thickening. Tiny umbilical hernia containing fat. Musculoskeletal: Stable lumbar spine degenerative changes and scoliosis, including facet degenerative changes with associated grade 1 anterolisthesis at the L4-5 level, without significant change. No fractures or pars defects are seen. IMPRESSION: 1. Left lateral subcutaneous edema and skin thickening compatible with bruising. 2. Small bilateral pleural effusions. 3. Changes of congestive heart failure with interstitial pulmonary edema. 4. Stable 6 mm left lower lobe nodule. The long-term stability is compatible with a benign process. 5. Bilateral nonobstructing renal calculi. 6. Interval mild bilateral hydronephrosis and hydroureter to the level of the ureterovesical junction, without obstructing calculus seen. This is most likely physiological due to bladder distention. 7. Moderately large hiatal hernia. 8. Colonic diverticulosis. Aortic Atherosclerosis (ICD10-I70.0). Electronically Signed   By: Claudie Revering M.D.   On: 12/05/2018 20:38   Dg Chest Portable 1 View  Result Date: 12/05/2018 CLINICAL DATA:  Chest pain. EXAM: PORTABLE CHEST 1 VIEW COMPARISON:  Radiographs of September 19, 2016. FINDINGS: Stable cardiomediastinal silhouette. Sternotomy wires are noted. No pneumothorax or pleural effusion is noted. Right lung is clear. Mild left perihilar interstitial  opacities are noted concerning for subsegmental atelectasis or possibly infiltrate. Bony thorax is unremarkable. IMPRESSION: Mild left perihilar interstitial opacities are noted concerning for subsegmental atelectasis or possibly pneumonia. Electronically Signed   By: Marijo Conception, M.D.   On: 12/05/2018 09:02    Cardiac Studies   Echocardiogram 09/2018: Study Conclusions  - Left ventricle: The cavity size was normal. Wall thickness was   normal. Systolic function was mildly reduced. The estimated   ejection fraction was in the range of 45% to 50%. Features are   consistent with a pseudonormal left ventricular filling pattern,   with concomitant abnormal relaxation and increased filling   pressure (grade 2 diastolic dysfunction). - Aortic valve: A mechanical prosthesis was present. There was mild   regurgitation. Valve area (VTI): 1.43 cm^2. Valve area (Vmax):   1.28 cm^2. Valve area (Vmean): 1.2 cm^2. - Mitral valve: Mildly to moderately calcified annulus. Valve area   by continuity equation (using LVOT flow): 1.55 cm^2. - Left atrium: The atrium was mildly dilated.  Patient Profile     77 y.o. female with a PMH of VHD/AS w/mechanical AVR (2007), HTN, HLD, CKD, h/o R breast cancer treated with surgery/chemo/XRT, LBBB, and a Parkinson-like syndrome with unsteady gait with falls, and AFib who is being followed by cardiology for the evaluation of aflutter w/RVR  Assessment & Plan    1. Atrial fibrillation with RVR: Felt to be driven by anemia in the setting of recent fall with significant hematoma and possible GI bleed. HR improved with blood transfusions. Warfarin on hold given concern for bleeding. Not on AV nodal blocking agents as she has a history of bradycardia.  - Continue to monitor on telemetry - Resume warfarin  per pharmacy when cleared by GI and Hgb Stable for CHA2DS2-VASc Score of 4 (CHF, Female, and Age >75)  2. Flash pulmonary edema in patient with chronic combined CHF:  EF 45-50% with G2DD on echo 09/2018. CT Chest with interstitial pulmonary edema. Likely exacerbated by blood transfusions as she received 2 uPRBC with worsening SOB and IV lasix given. Rapid response called overnight for tachypnea and tachycardia for which additional lasix was given with improvement in symptoms.  She is net -184mL UOP this admission. She was given 60mg  IV lasix x1 this morning for persistent hypoxia and crackles on lung exam. - Continue to monitor strict I&Os and daily weights - Additional lasix pending improvement in breathing   3. Marked anemia: Hgb 5.6 on admission. CT C/A/P without clear source of bleeding, however patient with recent fall and significant bruising of L chest wall/abdomen. Also with +occult blood test for which GI is following with plans for possible upper endoscopy. She received 2 uPRBC with improvement in Hgb to 9.4>8.9 this morning. Likely driving #1 with treatment exacerbating #2. - Continue management per primary team.   4. S/p mechanical AVR: warfarin on hold for acute anemia (hematoma +/- GI bleed). Valve stable on last echo 09/2018. INR 3.37 today - still at goal (2.5-3.5) despite holding dose overnight.  - If Hgb stable, would initiate heparin gtt once INR <2.5 or consider restarting warfarin if no procedures planned.   5. CKD stage 4: Cr 1.52 today; appears to be baseline - Continue to monitor closely with IV diuresis  6. Elevated troponin: Trop trend 0.05>0.07. Trend not consistent with ACS. Likely demand ischemia in the setting of marked atrial fibrillation and afib RVR.  - No further ischemic evaluation at this time.   For questions or updates, please contact [place] Please consult [email or web address] for contact info under Cardiology/STEMI.      Signed, Abigail Butts, PA-C  12/06/2018, 11:39 AM   [phone]

## 2018-12-06 NOTE — Plan of Care (Signed)

## 2018-12-06 NOTE — Progress Notes (Signed)
Pt placed back on BiPAP at 2310. At [place], pt took BiPAP off, stating that mask made her uncomfortable. Importance of keeping mask on explained to pt, and pt agreed to try it again later. RN explained to pt that if her RR continues to increase, will need to put mask on.  RR 20-30, other VS stable. Pt placed on nasal cannula 3 L, SpO2 100%. Will continue to monitor pt.

## 2018-12-06 NOTE — Progress Notes (Signed)
Order for transfusion indicates to transfuse 2 units of RBC, if Hemoglobin <10 after initial 2 units are transfused.  Schorr, NP consulted, will hold transfusion for now, pt hemoglobin 9.4. Earlier pt placed on BiPAP and lasix given due to pleural effusion. Will continue to monitor pt.

## 2018-12-07 DIAGNOSIS — I4891 Unspecified atrial fibrillation: Secondary | ICD-10-CM

## 2018-12-07 DIAGNOSIS — Z7901 Long term (current) use of anticoagulants: Secondary | ICD-10-CM

## 2018-12-07 DIAGNOSIS — I4892 Unspecified atrial flutter: Secondary | ICD-10-CM

## 2018-12-07 LAB — CBC
HCT: 29.7 % — ABNORMAL LOW (ref 36.0–46.0)
Hemoglobin: 9.3 g/dL — ABNORMAL LOW (ref 12.0–15.0)
MCH: 30.6 pg (ref 26.0–34.0)
MCHC: 31.3 g/dL (ref 30.0–36.0)
MCV: 97.7 fL (ref 80.0–100.0)
Platelets: 218 10*3/uL (ref 150–400)
RBC: 3.04 MIL/uL — ABNORMAL LOW (ref 3.87–5.11)
RDW: 18 % — ABNORMAL HIGH (ref 11.5–15.5)
WBC: 6.3 10*3/uL (ref 4.0–10.5)
nRBC: 0 % (ref 0.0–0.2)

## 2018-12-07 LAB — BASIC METABOLIC PANEL
Anion gap: 13 (ref 5–15)
BUN: 34 mg/dL — ABNORMAL HIGH (ref 8–23)
CO2: 27 mmol/L (ref 22–32)
Calcium: 7.9 mg/dL — ABNORMAL LOW (ref 8.9–10.3)
Chloride: 99 mmol/L (ref 98–111)
Creatinine, Ser: 1.8 mg/dL — ABNORMAL HIGH (ref 0.44–1.00)
GFR calc Af Amer: 31 mL/min — ABNORMAL LOW (ref >60)
GFR calc non Af Amer: 26 mL/min — ABNORMAL LOW (ref >60)
Glucose, Bld: 109 mg/dL — ABNORMAL HIGH (ref 70–99)
Potassium: 3.4 mmol/L — ABNORMAL LOW (ref 3.5–5.1)
Sodium: 139 mmol/L (ref 135–145)

## 2018-12-07 LAB — PROTIME-INR
INR: 2.29
Prothrombin Time: 24.9 seconds — ABNORMAL HIGH (ref 11.4–15.2)

## 2018-12-07 MED ORDER — ALUM & MAG HYDROXIDE-SIMETH 200-200-20 MG/5ML PO SUSP
15.0000 mL | ORAL | Status: DC | PRN
  Administered 2018-12-07: 15 mL via ORAL
  Filled 2018-12-07: qty 30

## 2018-12-07 MED ORDER — POTASSIUM CHLORIDE CRYS ER 20 MEQ PO TBCR
60.0000 meq | EXTENDED_RELEASE_TABLET | Freq: Once | ORAL | Status: AC
  Administered 2018-12-07: 60 meq via ORAL
  Filled 2018-12-07: qty 3

## 2018-12-07 NOTE — Progress Notes (Signed)
[facility] Progress Note   Chief Complaint:  Acute on chronic anemia    SUBJECTIVE:       ASSESSMENT AND PLAN:   1. Chronic, mild macrocytic anemia now with acute on chronic anemia (with normal MCV)  in setting of extensive hematoma post fall as well CKD and dark stool heme positive stools on anticoagulant. Plan was for EGD this am but she developed acute respiratory failure following blood transfusion and required Bipap.  -Received IV lasix x 3 doses. No longer requiring supplement 02. Sats normal.  -INR is drifting,  2.29 today.  -Will reschedule for EGD tomorrow provided she remains medically stable overnight.   2. AFIB with RVR, rate improved but some bursts of atrial tachycardia with PACs on tele. Being closely monitored for now.   3. Elevated troponin . Hospitalist doesn't feel c/w ACS. They have been in touch with Cardiology who doesn't recommend further ischemic workup right now   OBJECTIVE:     Vital signs in last 24 hours: Temp:  [98.3 F (36.8 C)-98.6 F (37 C)] 98.6 F (37 C) (12/12 1432) Pulse Rate:  [70] 70 (12/12 1432) Resp:  [18] 18 (12/12 1432) BP: (93)/(57) 93/57 (12/12 1432) SpO2:  [100 %] 100 % (12/12 1432) Last BM Date: 12/04/18 General:   Sleeping, husband at bedside. NAD Pulm: Normal respiratory effort. No wheezing / crackles in chest or laterally Abdomen:  Soft, nondistended, nontender.  Normal bowel sounds         Intake/Output from previous day: 12/11 0701 - 12/12 0700 In: 660 [P.O.:660] Out: 1300 [Urine:1300] Intake/Output this shift: Total I/O In: 340 [P.O.:340] Out: -   Lab Results: Recent Labs    12/05/18 1854 12/06/18 0739 12/07/18 0435  WBC 8.0 6.3 6.3  HGB 9.4* 8.9* 9.3*  HCT 29.3* 28.3* 29.7*  PLT 221 220 218   BMET Recent Labs    12/05/18 0715 12/06/18 0739 12/07/18 0435  NA 135 136 139  K 3.8 2.9* 3.4*  CL 97* 97* 99  CO2 27 26 27   GLUCOSE 110* 98 109*  BUN 39* 29* 34*  CREATININE 1.83*  1.52* 1.80*  CALCIUM 9.1 8.2* 7.9*   LFT No results for input(s): PROT, ALBUMIN, AST, ALT, ALKPHOS, BILITOT, BILIDIR, IBILI in the last 72 hours. PT/INR Recent Labs    12/06/18 0739 12/07/18 0435  LABPROT 33.6* 24.9*  INR 3.37 2.29   Hepatitis Panel No results for input(s): HEPBSAG, HCVAB, HEPAIGM, HEPBIGM in the last 72 hours.  Ct Abdomen Pelvis Wo Contrast  Result Date: 12/05/2018 CLINICAL DATA:  Chest pain and chronic shortness of breath. Minor blunt abdominal trauma. EXAM: CT CHEST, ABDOMEN AND PELVIS WITHOUT CONTRAST TECHNIQUE: Multidetector CT imaging of the chest, abdomen and pelvis was performed following the standard protocol without IV contrast. COMPARISON:  Portable chest obtained earlier today. Chest and abdomen radiographs dated 09/19/2016. Abdomen and pelvis CT dated 08/10/2016. FINDINGS: CT CHEST FINDINGS Cardiovascular: Atheromatous calcifications, including the coronary arteries and aorta. Post CABG changes. Borderline enlarged heart. Dense mitral valve annulus calcifications. Mediastinum/Nodes: No enlarged mediastinal, hilar, or axillary lymph nodes. Thyroid gland, trachea, and esophagus demonstrate no significant findings. A moderately large hiatal hernia is noted. Lungs/Pleura: Small bilateral pleural effusions. Interstitial thickening throughout the majority of both lungs. Seven by 6 mm nodule in the posterolateral aspect of the left lower lobe on image number 114 series 5, confirmed on the sagittal and coronal reconstruction images. This is unchanged since 08/10/2016. No new nodules are seen. Musculoskeletal:  Thoracic and lower cervical spine degenerative changes. CT ABDOMEN PELVIS FINDINGS Hepatobiliary: No focal liver abnormality is seen. No gallstones, gallbladder wall thickening, or biliary dilatation. Pancreas: Diffusely atrophied. Spleen: Normal in size without focal abnormality. Adrenals/Urinary Tract: Several left renal calculi measuring up to 11 mm in maximum  diameter each. Tiny mid upper right renal calculus on coronal image number 68 series 6. Mild dilatation of the right renal collecting system and extrarenal pelvis with progression. Mildly dilated right ureter to the level of the ureterovesical junction with no obstructing calculus seen. Interval mild dilatation of the left renal collecting system and ureter to the level of the ureterovesical junction with no obstructing calculus seen. Mildly distended urinary bladder. Stomach/Bowel: Moderately large hiatal hernia. Multiple colonic diverticula without evidence of diverticulitis. Normal appearing appendix and small bowel. Vascular/Lymphatic: Atheromatous arterial calcifications without aneurysm. No enlarged lymph nodes. Reproductive: Status post hysterectomy. No adnexal masses. Other: Left lateral subcutaneous edema and skin thickening. Tiny umbilical hernia containing fat. Musculoskeletal: Stable lumbar spine degenerative changes and scoliosis, including facet degenerative changes with associated grade 1 anterolisthesis at the L4-5 level, without significant change. No fractures or pars defects are seen. IMPRESSION: 1. Left lateral subcutaneous edema and skin thickening compatible with bruising. 2. Small bilateral pleural effusions. 3. Changes of congestive heart failure with interstitial pulmonary edema. 4. Stable 6 mm left lower lobe nodule. The long-term stability is compatible with a benign process. 5. Bilateral nonobstructing renal calculi. 6. Interval mild bilateral hydronephrosis and hydroureter to the level of the ureterovesical junction, without obstructing calculus seen. This is most likely physiological due to bladder distention. 7. Moderately large hiatal hernia. 8. Colonic diverticulosis. Aortic Atherosclerosis (ICD10-I70.0). Electronically Signed   By: Claudie Revering M.D.   On: 12/05/2018 20:38   Ct Chest Wo Contrast  Result Date: 12/05/2018 CLINICAL DATA:  Chest pain and chronic shortness of breath.  Minor blunt abdominal trauma. EXAM: CT CHEST, ABDOMEN AND PELVIS WITHOUT CONTRAST TECHNIQUE: Multidetector CT imaging of the chest, abdomen and pelvis was performed following the standard protocol without IV contrast. COMPARISON:  Portable chest obtained earlier today. Chest and abdomen radiographs dated 09/19/2016. Abdomen and pelvis CT dated 08/10/2016. FINDINGS: CT CHEST FINDINGS Cardiovascular: Atheromatous calcifications, including the coronary arteries and aorta. Post CABG changes. Borderline enlarged heart. Dense mitral valve annulus calcifications. Mediastinum/Nodes: No enlarged mediastinal, hilar, or axillary lymph nodes. Thyroid gland, trachea, and esophagus demonstrate no significant findings. A moderately large hiatal hernia is noted. Lungs/Pleura: Small bilateral pleural effusions. Interstitial thickening throughout the majority of both lungs. Seven by 6 mm nodule in the posterolateral aspect of the left lower lobe on image number 114 series 5, confirmed on the sagittal and coronal reconstruction images. This is unchanged since 08/10/2016. No new nodules are seen. Musculoskeletal: Thoracic and lower cervical spine degenerative changes. CT ABDOMEN PELVIS FINDINGS Hepatobiliary: No focal liver abnormality is seen. No gallstones, gallbladder wall thickening, or biliary dilatation. Pancreas: Diffusely atrophied. Spleen: Normal in size without focal abnormality. Adrenals/Urinary Tract: Several left renal calculi measuring up to 11 mm in maximum diameter each. Tiny mid upper right renal calculus on coronal image number 68 series 6. Mild dilatation of the right renal collecting system and extrarenal pelvis with progression. Mildly dilated right ureter to the level of the ureterovesical junction with no obstructing calculus seen. Interval mild dilatation of the left renal collecting system and ureter to the level of the ureterovesical junction with no obstructing calculus seen. Mildly distended urinary bladder.  Stomach/Bowel: Moderately large hiatal hernia. Multiple  colonic diverticula without evidence of diverticulitis. Normal appearing appendix and small bowel. Vascular/Lymphatic: Atheromatous arterial calcifications without aneurysm. No enlarged lymph nodes. Reproductive: Status post hysterectomy. No adnexal masses. Other: Left lateral subcutaneous edema and skin thickening. Tiny umbilical hernia containing fat. Musculoskeletal: Stable lumbar spine degenerative changes and scoliosis, including facet degenerative changes with associated grade 1 anterolisthesis at the L4-5 level, without significant change. No fractures or pars defects are seen. IMPRESSION: 1. Left lateral subcutaneous edema and skin thickening compatible with bruising. 2. Small bilateral pleural effusions. 3. Changes of congestive heart failure with interstitial pulmonary edema. 4. Stable 6 mm left lower lobe nodule. The long-term stability is compatible with a benign process. 5. Bilateral nonobstructing renal calculi. 6. Interval mild bilateral hydronephrosis and hydroureter to the level of the ureterovesical junction, without obstructing calculus seen. This is most likely physiological due to bladder distention. 7. Moderately large hiatal hernia. 8. Colonic diverticulosis. Aortic Atherosclerosis (ICD10-I70.0). Electronically Signed   By: Claudie Revering M.D.   On: 12/05/2018 20:38      Principal Problem:   Symptomatic anemia Active Problems:   S/P AVR (aortic valve replacement)   HTN (hypertension)   Hypercholesteremia   Atrial fibrillation with RVR (HCC)   Chronic anticoagulation   GI bleed   CKD (chronic kidney disease), stage III (HCC)   Elevated d-dimer   SOB (shortness of breath)   Supratherapeutic INR   Parkinsonism (HCC)   Atrial flutter (HCC)   Heme positive stool     LOS: 2 days   [name] ,NP 12/07/2018, 3:34 PM

## 2018-12-07 NOTE — H&P (View-Only) (Signed)
[facility] Progress Note   Chief Complaint:  Acute on chronic anemia    SUBJECTIVE:       ASSESSMENT AND PLAN:   1. Chronic, mild macrocytic anemia now with acute on chronic anemia (with normal MCV)  in setting of extensive hematoma post fall as well CKD and dark stool heme positive stools on anticoagulant. Plan was for EGD this am but she developed acute respiratory failure following blood transfusion and required Bipap.  -Received IV lasix x 3 doses. No longer requiring supplement 02. Sats normal.  -INR is drifting,  2.29 today.  -Will reschedule for EGD tomorrow provided she remains medically stable overnight.   2. AFIB with RVR, rate improved but some bursts of atrial tachycardia with PACs on tele. Being closely monitored for now.   3. Elevated troponin . Hospitalist doesn't feel c/w ACS. They have been in touch with Cardiology who doesn't recommend further ischemic workup right now   OBJECTIVE:     Vital signs in last 24 hours: Temp:  [98.3 F (36.8 C)-98.6 F (37 C)] 98.6 F (37 C) (12/12 1432) Pulse Rate:  [70] 70 (12/12 1432) Resp:  [18] 18 (12/12 1432) BP: (93)/(57) 93/57 (12/12 1432) SpO2:  [100 %] 100 % (12/12 1432) Last BM Date: 12/04/18 General:   Sleeping, husband at bedside. NAD Pulm: Normal respiratory effort. No wheezing / crackles in chest or laterally Abdomen:  Soft, nondistended, nontender.  Normal bowel sounds         Intake/Output from previous day: 12/11 0701 - 12/12 0700 In: 660 [P.O.:660] Out: 1300 [Urine:1300] Intake/Output this shift: Total I/O In: 340 [P.O.:340] Out: -   Lab Results: Recent Labs    12/05/18 1854 12/06/18 0739 12/07/18 0435  WBC 8.0 6.3 6.3  HGB 9.4* 8.9* 9.3*  HCT 29.3* 28.3* 29.7*  PLT 221 220 218   BMET Recent Labs    12/05/18 0715 12/06/18 0739 12/07/18 0435  NA 135 136 139  K 3.8 2.9* 3.4*  CL 97* 97* 99  CO2 27 26 27   GLUCOSE 110* 98 109*  BUN 39* 29* 34*  CREATININE 1.83*  1.52* 1.80*  CALCIUM 9.1 8.2* 7.9*   LFT No results for input(s): PROT, ALBUMIN, AST, ALT, ALKPHOS, BILITOT, BILIDIR, IBILI in the last 72 hours. PT/INR Recent Labs    12/06/18 0739 12/07/18 0435  LABPROT 33.6* 24.9*  INR 3.37 2.29   Hepatitis Panel No results for input(s): HEPBSAG, HCVAB, HEPAIGM, HEPBIGM in the last 72 hours.  Ct Abdomen Pelvis Wo Contrast  Result Date: 12/05/2018 CLINICAL DATA:  Chest pain and chronic shortness of breath. Minor blunt abdominal trauma. EXAM: CT CHEST, ABDOMEN AND PELVIS WITHOUT CONTRAST TECHNIQUE: Multidetector CT imaging of the chest, abdomen and pelvis was performed following the standard protocol without IV contrast. COMPARISON:  Portable chest obtained earlier today. Chest and abdomen radiographs dated 09/19/2016. Abdomen and pelvis CT dated 08/10/2016. FINDINGS: CT CHEST FINDINGS Cardiovascular: Atheromatous calcifications, including the coronary arteries and aorta. Post CABG changes. Borderline enlarged heart. Dense mitral valve annulus calcifications. Mediastinum/Nodes: No enlarged mediastinal, hilar, or axillary lymph nodes. Thyroid gland, trachea, and esophagus demonstrate no significant findings. A moderately large hiatal hernia is noted. Lungs/Pleura: Small bilateral pleural effusions. Interstitial thickening throughout the majority of both lungs. Seven by 6 mm nodule in the posterolateral aspect of the left lower lobe on image number 114 series 5, confirmed on the sagittal and coronal reconstruction images. This is unchanged since 08/10/2016. No new nodules are seen. Musculoskeletal:  Thoracic and lower cervical spine degenerative changes. CT ABDOMEN PELVIS FINDINGS Hepatobiliary: No focal liver abnormality is seen. No gallstones, gallbladder wall thickening, or biliary dilatation. Pancreas: Diffusely atrophied. Spleen: Normal in size without focal abnormality. Adrenals/Urinary Tract: Several left renal calculi measuring up to 11 mm in maximum  diameter each. Tiny mid upper right renal calculus on coronal image number 68 series 6. Mild dilatation of the right renal collecting system and extrarenal pelvis with progression. Mildly dilated right ureter to the level of the ureterovesical junction with no obstructing calculus seen. Interval mild dilatation of the left renal collecting system and ureter to the level of the ureterovesical junction with no obstructing calculus seen. Mildly distended urinary bladder. Stomach/Bowel: Moderately large hiatal hernia. Multiple colonic diverticula without evidence of diverticulitis. Normal appearing appendix and small bowel. Vascular/Lymphatic: Atheromatous arterial calcifications without aneurysm. No enlarged lymph nodes. Reproductive: Status post hysterectomy. No adnexal masses. Other: Left lateral subcutaneous edema and skin thickening. Tiny umbilical hernia containing fat. Musculoskeletal: Stable lumbar spine degenerative changes and scoliosis, including facet degenerative changes with associated grade 1 anterolisthesis at the L4-5 level, without significant change. No fractures or pars defects are seen. IMPRESSION: 1. Left lateral subcutaneous edema and skin thickening compatible with bruising. 2. Small bilateral pleural effusions. 3. Changes of congestive heart failure with interstitial pulmonary edema. 4. Stable 6 mm left lower lobe nodule. The long-term stability is compatible with a benign process. 5. Bilateral nonobstructing renal calculi. 6. Interval mild bilateral hydronephrosis and hydroureter to the level of the ureterovesical junction, without obstructing calculus seen. This is most likely physiological due to bladder distention. 7. Moderately large hiatal hernia. 8. Colonic diverticulosis. Aortic Atherosclerosis (ICD10-I70.0). Electronically Signed   By: Claudie Revering M.D.   On: 12/05/2018 20:38   Ct Chest Wo Contrast  Result Date: 12/05/2018 CLINICAL DATA:  Chest pain and chronic shortness of breath.  Minor blunt abdominal trauma. EXAM: CT CHEST, ABDOMEN AND PELVIS WITHOUT CONTRAST TECHNIQUE: Multidetector CT imaging of the chest, abdomen and pelvis was performed following the standard protocol without IV contrast. COMPARISON:  Portable chest obtained earlier today. Chest and abdomen radiographs dated 09/19/2016. Abdomen and pelvis CT dated 08/10/2016. FINDINGS: CT CHEST FINDINGS Cardiovascular: Atheromatous calcifications, including the coronary arteries and aorta. Post CABG changes. Borderline enlarged heart. Dense mitral valve annulus calcifications. Mediastinum/Nodes: No enlarged mediastinal, hilar, or axillary lymph nodes. Thyroid gland, trachea, and esophagus demonstrate no significant findings. A moderately large hiatal hernia is noted. Lungs/Pleura: Small bilateral pleural effusions. Interstitial thickening throughout the majority of both lungs. Seven by 6 mm nodule in the posterolateral aspect of the left lower lobe on image number 114 series 5, confirmed on the sagittal and coronal reconstruction images. This is unchanged since 08/10/2016. No new nodules are seen. Musculoskeletal: Thoracic and lower cervical spine degenerative changes. CT ABDOMEN PELVIS FINDINGS Hepatobiliary: No focal liver abnormality is seen. No gallstones, gallbladder wall thickening, or biliary dilatation. Pancreas: Diffusely atrophied. Spleen: Normal in size without focal abnormality. Adrenals/Urinary Tract: Several left renal calculi measuring up to 11 mm in maximum diameter each. Tiny mid upper right renal calculus on coronal image number 68 series 6. Mild dilatation of the right renal collecting system and extrarenal pelvis with progression. Mildly dilated right ureter to the level of the ureterovesical junction with no obstructing calculus seen. Interval mild dilatation of the left renal collecting system and ureter to the level of the ureterovesical junction with no obstructing calculus seen. Mildly distended urinary bladder.  Stomach/Bowel: Moderately large hiatal hernia. Multiple  colonic diverticula without evidence of diverticulitis. Normal appearing appendix and small bowel. Vascular/Lymphatic: Atheromatous arterial calcifications without aneurysm. No enlarged lymph nodes. Reproductive: Status post hysterectomy. No adnexal masses. Other: Left lateral subcutaneous edema and skin thickening. Tiny umbilical hernia containing fat. Musculoskeletal: Stable lumbar spine degenerative changes and scoliosis, including facet degenerative changes with associated grade 1 anterolisthesis at the L4-5 level, without significant change. No fractures or pars defects are seen. IMPRESSION: 1. Left lateral subcutaneous edema and skin thickening compatible with bruising. 2. Small bilateral pleural effusions. 3. Changes of congestive heart failure with interstitial pulmonary edema. 4. Stable 6 mm left lower lobe nodule. The long-term stability is compatible with a benign process. 5. Bilateral nonobstructing renal calculi. 6. Interval mild bilateral hydronephrosis and hydroureter to the level of the ureterovesical junction, without obstructing calculus seen. This is most likely physiological due to bladder distention. 7. Moderately large hiatal hernia. 8. Colonic diverticulosis. Aortic Atherosclerosis (ICD10-I70.0). Electronically Signed   By: Claudie Revering M.D.   On: 12/05/2018 20:38      Principal Problem:   Symptomatic anemia Active Problems:   S/P AVR (aortic valve replacement)   HTN (hypertension)   Hypercholesteremia   Atrial fibrillation with RVR (HCC)   Chronic anticoagulation   GI bleed   CKD (chronic kidney disease), stage III (HCC)   Elevated d-dimer   SOB (shortness of breath)   Supratherapeutic INR   Parkinsonism (HCC)   Atrial flutter (HCC)   Heme positive stool     LOS: 2 days   Tye Savoy ,NP 12/07/2018, 3:34 PM

## 2018-12-07 NOTE — Plan of Care (Signed)

## 2018-12-07 NOTE — Evaluation (Signed)
Physical Therapy Evaluation Patient Details Name: [name] MRN: [id number] DOB: 10-Mar-1941 Today's Date: 12/07/2018   History of Present Illness  pt is a 77 y/o female with pmh significant for Parkinsons, HTN, afib, CKD2, presenting with aching from significant bruising from fall ~1.5 weeks before admission and with chest pain.  Clinical Impression  Pt admitted with/for CP after recent fall.  Presently the pt needs min assist that her husband can provide.  Pt currently limited functionally due to the problems listed below.  (see problems list.)  Pt will benefit from PT to maximize function and safety to be able to get home safely with available assist.     Follow Up Recommendations Home health PT;Follow surgeon's recommendation for DC plan and follow-up therapies    Equipment Recommendations  None recommended by PT    Recommendations for Other Services       Precautions / Restrictions Precautions Precautions: Fall      Mobility  Bed Mobility Overal bed mobility: Needs Assistance Bed Mobility: Supine to Sit     Supine to sit: Min guard     General bed mobility comments: slow and struggle, but no assist, no use of the rail  Transfers Overall transfer level: Needs assistance Equipment used: Rolling walker (2 wheeled) Transfers: Sit to/from Stand Sit to Stand: Min assist         General transfer comment: stability assist.  Occasional cues for hand placement  Ambulation/Gait Ambulation/Gait assistance: Min assist;Min guard Gait Distance (Feet): 15 Feet(and the 30 feet from toilet out to the hallway, back to chai) Assistive device: Rolling walker (2 wheeled) Gait Pattern/deviations: Step-through pattern Gait velocity: slower Gait velocity interpretation: <1.31 ft/sec, indicative of household ambulator General Gait Details: short, tentative steps that worsen with turns and backing up.  Stairs            Wheelchair Mobility    Modified Rankin  (Stroke Patients Only)       Balance Overall balance assessment: Needs assistance Sitting-balance support: Feet supported Sitting balance-Leahy Scale: Good     Standing balance support: Bilateral upper extremity supported;Single extremity supported Standing balance-Leahy Scale: Poor Standing balance comment: reliant on the RW or external support                             Pertinent Vitals/Pain Pain Assessment: Faces Faces Pain Scale: Hurts little more Pain Location: L foot Pain Descriptors / Indicators: Sore;Grimacing Pain Intervention(s): Monitored during session    Home Living Family/patient expects to be discharged to:: Private residence Living Arrangements: Spouse/significant other Available Help at Discharge: Family;Available 24 hours/day Type of Home: House Home Access: Stairs to enter Entrance Stairs-Rails: None Entrance Stairs-Number of Steps: 2 Home Layout: Two level;Able to live on main level with bedroom/bathroom Home Equipment: Walker - 4 wheels;Cane - single point      Prior Function Level of Independence: Needs assistance   Gait / Transfers Assistance Needed: Uses RW in the home and lately husband has been following along  ADL's / Homemaking Assistance Needed: husband assists with ADL's        Hand Dominance        Extremity/Trunk Assessment   Upper Extremity Assessment Upper Extremity Assessment: Overall WFL for tasks assessed    Lower Extremity Assessment Lower Extremity Assessment: Overall WFL for tasks assessed(very mild general weakness)       Communication   Communication: No difficulties  Cognition Arousal/Alertness: Awake/alert Behavior During Therapy: [facility]  for tasks assessed/performed Overall Cognitive Status: Within Functional Limits for tasks assessed                                        General Comments      Exercises     Assessment/Plan    PT Assessment Patient needs continued PT  services  PT Problem List Decreased activity tolerance;Decreased balance;Decreased mobility;Decreased coordination;Pain       PT Treatment Interventions DME instruction;Gait training;Functional mobility training;Therapeutic activities;Stair training;Balance training;Patient/family education    PT Goals (Current goals can be found in the Care Plan section)  Acute Rehab PT Goals Patient Stated Goal: home soon  PT Goal Formulation: With patient Time For Goal Achievement: 12/21/18 Potential to Achieve Goals: Good    Frequency Min 3X/week   Barriers to discharge        Co-evaluation               AM-PAC PT "6 Clicks" Mobility  Outcome Measure Help needed turning from your back to your side while in a flat bed without using bedrails?: A Little Help needed moving from lying on your back to sitting on the side of a flat bed without using bedrails?: None Help needed moving to and from a bed to a chair (including a wheelchair)?: A Little Help needed standing up from a chair using your arms (e.g., wheelchair or bedside chair)?: A Little Help needed to walk in hospital room?: A Little Help needed climbing 3-5 steps with a railing? : A Lot 6 Click Score: 18    End of Session   Activity Tolerance: Patient tolerated treatment well;Patient limited by pain Patient left: in chair;with call bell/phone within reach;with family/visitor present Nurse Communication: Mobility status PT Visit Diagnosis: Unsteadiness on feet (R26.81);Difficulty in walking, not elsewhere classified (R26.2)    Time: 2761-4709 PT Time Calculation (min) (ACUTE ONLY): 52 min   Charges:   PT Evaluation $PT Eval Moderate Complexity: 1 Mod PT Treatments $Gait Training: 8-22 mins $Therapeutic Activity: 8-22 mins        12/07/2018  Donnella Sham, PT Acute Rehabilitation Services [phone]  (pager) [phone]  (office)  Tessie Fass [place] 12/07/2018, 11:33 AM

## 2018-12-07 NOTE — Progress Notes (Signed)
PROGRESS NOTE        PATIENT DETAILS Name: [name] Age: 77 y.o. Sex: female Date of Birth: 02-06-1941 Admit Date: 12/05/2018 Admitting Physician Karmen Bongo, MD OEV:[id number], Quillian Quince, MD  Brief Narrative: Patient is a 77 y.o. female with history of A. fib on anticoagulation, chronic systolic heart failure, aortic valve Parkinson's disease-with recent history of mechanical fall complicated by significant bruising of her left torso area-presented to the hospital for evaluation of chest pain, she was found to have a hemoglobin of 5.6.  There was some concern for recent GI bleeding-she was transfused 2 units of PRBC and admitted to the hospitalist service.  Hospital course further complicated by development of acute hypoxic respiratory failure in the setting of pulmonary edema requiring BiPAP.  See below for further details  Subjective: Much improved-looks of a lot better-no longer requiring BiPAP since yesterday afternoon.  No melena or bloody stools since admission.  Assessment/Plan: Acute hypoxic respiratory failure secondary to pulmonary edema/acute on chronic systolic heart failure: In a setting of blood transfusion-markedly improved with Lasix-continue to titrate down oxygen.   Anemia: Felt to be multifactorial-appears to have chronic normocytic anemia at baseline-with contributions from bruising/ecchymosis in the subcutaneous tissue, some amount of acute blood loss from a Supple/recent GI bleeding all contributing to worsening hemoglobin.  Has been transfused 2 units of PRBC on 12/10, hemoglobin currently stable.  Follow CBC periodically.  ?  GI bleeding: Concern for upper GI bleeding-does give history of some melanotic stools over the past few days-however none since admission-GI following-since her respiratory status is much better-should be okay to proceed with endoscopic evaluation at the discretion of gastroenterology.    A. fib with RVR: Rate  overall better-telemetry shows bursts of atrial tachycardia with PACs.  Spoke with cardiologist-Dr. Reggy Eye watchful observation-suspect rate will continue to improve as her acute illness improves.  Suspect heart rate could be elevated also from autonomic dysfunction associated with Parkinson's disease.  Coumadin remains on hold until all endoscopic procedures have been completed.  Mechanical aortic valve replacement: Coumadin remains on hold-INR therapeutic at 2.29 but drifting down-plans are for resumption of Coumadin after endoscopic procedures have been completed.    Elevated troponin: Trend is flat and not consistent with ACS-cardiology does not recommend any further ischemic evaluation at this time.  AKI on CKD stage III: AKI likely hemodynamically mediated-probably mild bump in creatinine today due to Lasix-hold Lasix-volume status is improved  Parkinson's disease: Stable-continue Sinemet.   She may have autonomic dysfunction associated with Parkinson's disease contributing to her recent falls.  Recent mechanical fall: Suspect she may have autonomic dysfunction associated with Parkinson's disease-PT evaluation completed-recommendations are for home health services.  Dyslipidemia: Resume statin over the next few days.  Chronic debility/deconditioning: Secondary to Parkinson's disease-may have worsened due to acute illness-PT evaluation completed-recommendations are for home health services  DVT Prophylaxis: Coumadin on hold-INR still slightly supratherapeutic  Code Status: Full code  Family Communication: Spouse at bedside  Disposition Plan: Remain inpatient-home over the next few days-when GI evaluation has been completed.  Antimicrobial agents: Anti-infectives (From admission, onward)   None      Procedures: None  CONSULTS:  cardiology and GI  Time spent: 35 minutes-Greater than 50% of this time was spent in counseling, explanation of diagnosis, planning of  further management, and coordination of care.   MEDICATIONS: Scheduled Meds: .  carbidopa-levodopa  2 tablet Oral TID  . chlorhexidine  15 mL Mouth Rinse BID  . HYDROcodone-acetaminophen  2 tablet Oral Once  . mouth rinse  15 mL Mouth Rinse q12n4p  . pantoprazole (PROTONIX) IV  40 mg Intravenous Q12H   Continuous Infusions:  PRN Meds:.acetaminophen, ondansetron (ZOFRAN) IV   PHYSICAL EXAM: Vital signs: Vitals:   12/06/18 0453 12/06/18 0853 12/06/18 1253 12/07/18 0300  BP: 113/76 121/64 (!) 99/51   Pulse:      Resp: (!) 23 19 (!) 22   Temp:    98.3 F (36.8 C)  TempSrc:      SpO2:  99% 98%   Weight:      Height:       Filed Weights   12/05/18 0719  Weight: 62.1 kg   Body mass index is 27.67 kg/m.   General appearance:Awake, alert, not in any distress.  Eyes:no scleral icterus. HEENT: Atraumatic and Normocephalic Neck: supple, no JVD. Resp:Good air entry bilaterally, no rhonchi or rales heard CVS: S1 S2 regular, 3/6 systolic murmur GI: Bowel sounds present, Non tender and not distended with no gaurding, rigidity or rebound. Extremities: B/L Lower Ext shows no edema, both legs are warm to touch Neurology:  Non focal Psychiatric: Normal judgment and insight. Normal mood. Skin:No Rash, warm and dry Wounds:N/A  I have personally reviewed following labs and imaging studies  LABORATORY DATA: CBC: Recent Labs  Lab 12/05/18 0715 12/05/18 1854 12/06/18 0739 12/07/18 0435  WBC 8.3 8.0 6.3 6.3  NEUTROABS 5.7  --   --   --   HGB 5.6* 9.4* 8.9* 9.3*  HCT 18.5* 29.3* 28.3* 29.7*  MCV 107.6* 96.4 96.6 97.7  PLT 226 221 220 782    Basic Metabolic Panel: Recent Labs  Lab 12/05/18 0715 12/05/18 0850 12/06/18 0739 12/07/18 0435  NA 135  --  136 139  K 3.8  --  2.9* 3.4*  CL 97*  --  97* 99  CO2 27  --  26 27  GLUCOSE 110*  --  98 109*  BUN 39*  --  29* 34*  CREATININE 1.83*  --  1.52* 1.80*  CALCIUM 9.1  --  8.2* 7.9*  MG  --  1.7  --   --      GFR: Estimated Creatinine Clearance: 20.7 mL/min (A) (by C-G formula based on SCr of 1.8 mg/dL (H)).  Liver Function Tests: No results for input(s): AST, ALT, ALKPHOS, BILITOT, PROT, ALBUMIN in the last 168 hours. No results for input(s): LIPASE, AMYLASE in the last 168 hours. No results for input(s): AMMONIA in the last 168 hours.  Coagulation Profile: Recent Labs  Lab 12/05/18 0715 12/05/18 1854 12/06/18 0739 12/07/18 0435  INR 3.45 2.75 3.37 2.29    Cardiac Enzymes: Recent Labs  Lab 12/05/18 1854  TROPONINI 0.07*    BNP (last 3 results) No results for input(s): PROBNP in the last 8760 hours.  HbA1C: No results for input(s): HGBA1C in the last 72 hours.  CBG: No results for input(s): GLUCAP in the last 168 hours.  Lipid Profile: No results for input(s): CHOL, HDL, LDLCALC, TRIG, CHOLHDL, LDLDIRECT in the last 72 hours.  Thyroid Function Tests: Recent Labs    12/05/18 0850  TSH 1.304    Anemia Panel: No results for input(s): VITAMINB12, FOLATE, FERRITIN, TIBC, IRON, RETICCTPCT in the last 72 hours.  Urine analysis:    Component Value Date/Time   COLORURINE RED (A) 09/19/2016 0608   APPEARANCEUR TURBID (A) 09/19/2016 9562  LABSPEC 1.020 09/19/2016 0608   PHURINE 7.5 09/19/2016 0608   GLUCOSEU 100 (A) 09/19/2016 0608   HGBUR LARGE (A) 09/19/2016 0608   BILIRUBINUR MODERATE (A) 09/19/2016 0608   KETONESUR 15 (A) 09/19/2016 0608   PROTEINUR 100 (A) 09/19/2016 0608   NITRITE POSITIVE (A) 09/19/2016 0608   LEUKOCYTESUR SMALL (A) 09/19/2016 0608    Sepsis Labs: Lactic Acid, Venous No results found for: LATICACIDVEN  MICROBIOLOGY: Recent Results (from the past 240 hour(s))  MRSA PCR Screening     Status: None   Collection Time: 12/05/18  3:13 PM  Result Value Ref Range Status   MRSA by PCR NEGATIVE NEGATIVE Final    Comment:        The GeneXpert MRSA Assay (FDA approved for NASAL specimens only), is one component of a comprehensive MRSA  colonization surveillance program. It is not intended to diagnose MRSA infection nor to guide or monitor treatment for MRSA infections. Performed at [facility], [place] [address], [place], [place] [address]     RADIOLOGY STUDIES/RESULTS: Ct Abdomen Pelvis Wo Contrast  Result Date: 12/05/2018 CLINICAL DATA:  Chest pain and chronic shortness of breath. Minor blunt abdominal trauma. EXAM: CT CHEST, ABDOMEN AND PELVIS WITHOUT CONTRAST TECHNIQUE: Multidetector CT imaging of the chest, abdomen and pelvis was performed following the standard protocol without IV contrast. COMPARISON:  Portable chest obtained earlier today. Chest and abdomen radiographs dated 09/19/2016. Abdomen and pelvis CT dated 08/10/2016. FINDINGS: CT CHEST FINDINGS Cardiovascular: Atheromatous calcifications, including the coronary arteries and aorta. Post CABG changes. Borderline enlarged heart. Dense mitral valve annulus calcifications. Mediastinum/Nodes: No enlarged mediastinal, hilar, or axillary lymph nodes. Thyroid gland, trachea, and esophagus demonstrate no significant findings. A moderately large hiatal hernia is noted. Lungs/Pleura: Small bilateral pleural effusions. Interstitial thickening throughout the majority of both lungs. Seven by 6 mm nodule in the posterolateral aspect of the left lower lobe on image number 114 series 5, confirmed on the sagittal and coronal reconstruction images. This is unchanged since 08/10/2016. No new nodules are seen. Musculoskeletal: Thoracic and lower cervical spine degenerative changes. CT ABDOMEN PELVIS FINDINGS Hepatobiliary: No focal liver abnormality is seen. No gallstones, gallbladder wall thickening, or biliary dilatation. Pancreas: Diffusely atrophied. Spleen: Normal in size without focal abnormality. Adrenals/Urinary Tract: Several left renal calculi measuring up to 11 mm in maximum diameter each. Tiny mid upper right renal calculus on coronal image number 68 series 6. Mild  dilatation of the right renal collecting system and extrarenal pelvis with progression. Mildly dilated right ureter to the level of the ureterovesical junction with no obstructing calculus seen. Interval mild dilatation of the left renal collecting system and ureter to the level of the ureterovesical junction with no obstructing calculus seen. Mildly distended urinary bladder. Stomach/Bowel: Moderately large hiatal hernia. Multiple colonic diverticula without evidence of diverticulitis. Normal appearing appendix and small bowel. Vascular/Lymphatic: Atheromatous arterial calcifications without aneurysm. No enlarged lymph nodes. Reproductive: Status post hysterectomy. No adnexal masses. Other: Left lateral subcutaneous edema and skin thickening. Tiny umbilical hernia containing fat. Musculoskeletal: Stable lumbar spine degenerative changes and scoliosis, including facet degenerative changes with associated grade 1 anterolisthesis at the L4-5 level, without significant change. No fractures or pars defects are seen. IMPRESSION: 1. Left lateral subcutaneous edema and skin thickening compatible with bruising. 2. Small bilateral pleural effusions. 3. Changes of congestive heart failure with interstitial pulmonary edema. 4. Stable 6 mm left lower lobe nodule. The long-term stability is compatible with a benign process. 5. Bilateral nonobstructing renal calculi.  6. Interval mild bilateral hydronephrosis and hydroureter to the level of the ureterovesical junction, without obstructing calculus seen. This is most likely physiological due to bladder distention. 7. Moderately large hiatal hernia. 8. Colonic diverticulosis. Aortic Atherosclerosis (ICD10-I70.0). Electronically Signed   By: Claudie Revering M.D.   On: 12/05/2018 20:38   Ct Chest Wo Contrast  Result Date: 12/05/2018 CLINICAL DATA:  Chest pain and chronic shortness of breath. Minor blunt abdominal trauma. EXAM: CT CHEST, ABDOMEN AND PELVIS WITHOUT CONTRAST TECHNIQUE:  Multidetector CT imaging of the chest, abdomen and pelvis was performed following the standard protocol without IV contrast. COMPARISON:  Portable chest obtained earlier today. Chest and abdomen radiographs dated 09/19/2016. Abdomen and pelvis CT dated 08/10/2016. FINDINGS: CT CHEST FINDINGS Cardiovascular: Atheromatous calcifications, including the coronary arteries and aorta. Post CABG changes. Borderline enlarged heart. Dense mitral valve annulus calcifications. Mediastinum/Nodes: No enlarged mediastinal, hilar, or axillary lymph nodes. Thyroid gland, trachea, and esophagus demonstrate no significant findings. A moderately large hiatal hernia is noted. Lungs/Pleura: Small bilateral pleural effusions. Interstitial thickening throughout the majority of both lungs. Seven by 6 mm nodule in the posterolateral aspect of the left lower lobe on image number 114 series 5, confirmed on the sagittal and coronal reconstruction images. This is unchanged since 08/10/2016. No new nodules are seen. Musculoskeletal: Thoracic and lower cervical spine degenerative changes. CT ABDOMEN PELVIS FINDINGS Hepatobiliary: No focal liver abnormality is seen. No gallstones, gallbladder wall thickening, or biliary dilatation. Pancreas: Diffusely atrophied. Spleen: Normal in size without focal abnormality. Adrenals/Urinary Tract: Several left renal calculi measuring up to 11 mm in maximum diameter each. Tiny mid upper right renal calculus on coronal image number 68 series 6. Mild dilatation of the right renal collecting system and extrarenal pelvis with progression. Mildly dilated right ureter to the level of the ureterovesical junction with no obstructing calculus seen. Interval mild dilatation of the left renal collecting system and ureter to the level of the ureterovesical junction with no obstructing calculus seen. Mildly distended urinary bladder. Stomach/Bowel: Moderately large hiatal hernia. Multiple colonic diverticula without evidence  of diverticulitis. Normal appearing appendix and small bowel. Vascular/Lymphatic: Atheromatous arterial calcifications without aneurysm. No enlarged lymph nodes. Reproductive: Status post hysterectomy. No adnexal masses. Other: Left lateral subcutaneous edema and skin thickening. Tiny umbilical hernia containing fat. Musculoskeletal: Stable lumbar spine degenerative changes and scoliosis, including facet degenerative changes with associated grade 1 anterolisthesis at the L4-5 level, without significant change. No fractures or pars defects are seen. IMPRESSION: 1. Left lateral subcutaneous edema and skin thickening compatible with bruising. 2. Small bilateral pleural effusions. 3. Changes of congestive heart failure with interstitial pulmonary edema. 4. Stable 6 mm left lower lobe nodule. The long-term stability is compatible with a benign process. 5. Bilateral nonobstructing renal calculi. 6. Interval mild bilateral hydronephrosis and hydroureter to the level of the ureterovesical junction, without obstructing calculus seen. This is most likely physiological due to bladder distention. 7. Moderately large hiatal hernia. 8. Colonic diverticulosis. Aortic Atherosclerosis (ICD10-I70.0). Electronically Signed   By: Claudie Revering M.D.   On: 12/05/2018 20:38   Dg Chest Portable 1 View  Result Date: 12/05/2018 CLINICAL DATA:  Chest pain. EXAM: PORTABLE CHEST 1 VIEW COMPARISON:  Radiographs of September 19, 2016. FINDINGS: Stable cardiomediastinal silhouette. Sternotomy wires are noted. No pneumothorax or pleural effusion is noted. Right lung is clear. Mild left perihilar interstitial opacities are noted concerning for subsegmental atelectasis or possibly infiltrate. Bony thorax is unremarkable. IMPRESSION: Mild left perihilar interstitial opacities are noted concerning for  subsegmental atelectasis or possibly pneumonia. Electronically Signed   By: Marijo Conception, M.D.   On: 12/05/2018 09:02     LOS: 2 days    Oren Binet, MD  [facility]  If 7PM-7AM, please contact night-coverage  Please page via [email or web address] [email or web address]-click on MD name and type text message  12/07/2018, 1:31 PM

## 2018-12-07 NOTE — Progress Notes (Signed)
Patient alert and oriented x 4,  Complains of pain to left lateral ankle, new onset.  Verbalized pain 8/10, NP notified and provided med interventions, however patient refused pain medications informing that narcotics make her hallucinate.  This was confirmed by her daughter at bedside.  Patient heartrate 110-140,s at start of shift, stablized without intervention to 70-80 with NSR as shift progressed.  Did not require Bipap overnight and denies respiratory distress.  Stable condition at end of shift, will continue to monitor.

## 2018-12-07 NOTE — Progress Notes (Addendum)
Progress Note  Patient Name: [name] Date of Encounter: 12/07/2018  Primary Cardiologist: Peter Martinique, MD   Subjective   Feeling better this morning. Breathing has improved and she is now on O2 via [place]. No complaints of chest pain or palpitations.   Inpatient Medications    Scheduled Meds: . carbidopa-levodopa  2 tablet Oral TID  . chlorhexidine  15 mL Mouth Rinse BID  . HYDROcodone-acetaminophen  2 tablet Oral Once  . mouth rinse  15 mL Mouth Rinse q12n4p  . pantoprazole (PROTONIX) IV  40 mg Intravenous Q12H   Continuous Infusions:  PRN Meds: acetaminophen, ondansetron (ZOFRAN) IV   Vital Signs    Vitals:   12/06/18 0453 12/06/18 0853 12/06/18 1253 12/07/18 0300  BP: 113/76 121/64 (!) 99/51   Pulse:      Resp: (!) 23 19 (!) 22   Temp:    98.3 F (36.8 C)  TempSrc:      SpO2:  99% 98%   Weight:      Height:        Intake/Output Summary (Last 24 hours) at 12/07/2018 0952 Last data filed at 12/07/2018 0700 Gross per 24 hour  Intake 660 ml  Output 1300 ml  Net -640 ml   Filed Weights   12/05/18 0719  Weight: 62.1 kg    Telemetry    Episodes of atrial flutter/atrial tachycardia and sinus rhythm - Personally Reviewed  Physical Exam   GEN: Laying in bed in no acute distress.   Neck: No JVD, no carotid bruits Cardiac: RRR, no murmurs, rubs, or gallops.  Respiratory: Clear to auscultation bilaterally, with faint crackles at lung bases GI: NABS, Soft, nontender, non-distended  MS: No edema; No deformity. Neuro:  Nonfocal, moving all extremities spontaneously Psych: Normal affect   Labs    Chemistry Recent Labs  Lab 12/05/18 0715 12/06/18 0739 12/07/18 0435  NA 135 136 139  K 3.8 2.9* 3.4*  CL 97* 97* 99  CO2 27 26 27   GLUCOSE 110* 98 109*  BUN 39* 29* 34*  CREATININE 1.83* 1.52* 1.80*  CALCIUM 9.1 8.2* 7.9*  GFRNONAA NOT CALCULATED 32* 26*  GFRAA NOT CALCULATED 38* 31*  ANIONGAP 11 13 13      Hematology Recent Labs  Lab  12/05/18 1854 12/06/18 0739 12/07/18 0435  WBC 8.0 6.3 6.3  RBC 3.04* 2.93* 3.04*  HGB 9.4* 8.9* 9.3*  HCT 29.3* 28.3* 29.7*  MCV 96.4 96.6 97.7  MCH 30.9 30.4 30.6  MCHC 32.1 31.4 31.3  RDW 18.7* 18.7* 18.0*  PLT 221 220 218    Cardiac Enzymes Recent Labs  Lab 12/05/18 1854  TROPONINI 0.07*    Recent Labs  Lab 12/05/18 0732  TROPIPOC 0.05     BNPNo results for input(s): BNP, PROBNP in the last 168 hours.   DDimer  Recent Labs  Lab 12/05/18 0715  DDIMER 3.90*     Radiology    Ct Abdomen Pelvis Wo Contrast  Result Date: 12/05/2018 CLINICAL DATA:  Chest pain and chronic shortness of breath. Minor blunt abdominal trauma. EXAM: CT CHEST, ABDOMEN AND PELVIS WITHOUT CONTRAST TECHNIQUE: Multidetector CT imaging of the chest, abdomen and pelvis was performed following the standard protocol without IV contrast. COMPARISON:  Portable chest obtained earlier today. Chest and abdomen radiographs dated 09/19/2016. Abdomen and pelvis CT dated 08/10/2016. FINDINGS: CT CHEST FINDINGS Cardiovascular: Atheromatous calcifications, including the coronary arteries and aorta. Post CABG changes. Borderline enlarged heart. Dense mitral valve annulus calcifications. Mediastinum/Nodes: No enlarged mediastinal,  hilar, or axillary lymph nodes. Thyroid gland, trachea, and esophagus demonstrate no significant findings. A moderately large hiatal hernia is noted. Lungs/Pleura: Small bilateral pleural effusions. Interstitial thickening throughout the majority of both lungs. Seven by 6 mm nodule in the posterolateral aspect of the left lower lobe on image number 114 series 5, confirmed on the sagittal and coronal reconstruction images. This is unchanged since 08/10/2016. No new nodules are seen. Musculoskeletal: Thoracic and lower cervical spine degenerative changes. CT ABDOMEN PELVIS FINDINGS Hepatobiliary: No focal liver abnormality is seen. No gallstones, gallbladder wall thickening, or biliary dilatation.  Pancreas: Diffusely atrophied. Spleen: Normal in size without focal abnormality. Adrenals/Urinary Tract: Several left renal calculi measuring up to 11 mm in maximum diameter each. Tiny mid upper right renal calculus on coronal image number 68 series 6. Mild dilatation of the right renal collecting system and extrarenal pelvis with progression. Mildly dilated right ureter to the level of the ureterovesical junction with no obstructing calculus seen. Interval mild dilatation of the left renal collecting system and ureter to the level of the ureterovesical junction with no obstructing calculus seen. Mildly distended urinary bladder. Stomach/Bowel: Moderately large hiatal hernia. Multiple colonic diverticula without evidence of diverticulitis. Normal appearing appendix and small bowel. Vascular/Lymphatic: Atheromatous arterial calcifications without aneurysm. No enlarged lymph nodes. Reproductive: Status post hysterectomy. No adnexal masses. Other: Left lateral subcutaneous edema and skin thickening. Tiny umbilical hernia containing fat. Musculoskeletal: Stable lumbar spine degenerative changes and scoliosis, including facet degenerative changes with associated grade 1 anterolisthesis at the L4-5 level, without significant change. No fractures or pars defects are seen. IMPRESSION: 1. Left lateral subcutaneous edema and skin thickening compatible with bruising. 2. Small bilateral pleural effusions. 3. Changes of congestive heart failure with interstitial pulmonary edema. 4. Stable 6 mm left lower lobe nodule. The long-term stability is compatible with a benign process. 5. Bilateral nonobstructing renal calculi. 6. Interval mild bilateral hydronephrosis and hydroureter to the level of the ureterovesical junction, without obstructing calculus seen. This is most likely physiological due to bladder distention. 7. Moderately large hiatal hernia. 8. Colonic diverticulosis. Aortic Atherosclerosis (ICD10-I70.0). Electronically  Signed   By: Claudie Revering M.D.   On: 12/05/2018 20:38   Ct Chest Wo Contrast  Result Date: 12/05/2018 CLINICAL DATA:  Chest pain and chronic shortness of breath. Minor blunt abdominal trauma. EXAM: CT CHEST, ABDOMEN AND PELVIS WITHOUT CONTRAST TECHNIQUE: Multidetector CT imaging of the chest, abdomen and pelvis was performed following the standard protocol without IV contrast. COMPARISON:  Portable chest obtained earlier today. Chest and abdomen radiographs dated 09/19/2016. Abdomen and pelvis CT dated 08/10/2016. FINDINGS: CT CHEST FINDINGS Cardiovascular: Atheromatous calcifications, including the coronary arteries and aorta. Post CABG changes. Borderline enlarged heart. Dense mitral valve annulus calcifications. Mediastinum/Nodes: No enlarged mediastinal, hilar, or axillary lymph nodes. Thyroid gland, trachea, and esophagus demonstrate no significant findings. A moderately large hiatal hernia is noted. Lungs/Pleura: Small bilateral pleural effusions. Interstitial thickening throughout the majority of both lungs. Seven by 6 mm nodule in the posterolateral aspect of the left lower lobe on image number 114 series 5, confirmed on the sagittal and coronal reconstruction images. This is unchanged since 08/10/2016. No new nodules are seen. Musculoskeletal: Thoracic and lower cervical spine degenerative changes. CT ABDOMEN PELVIS FINDINGS Hepatobiliary: No focal liver abnormality is seen. No gallstones, gallbladder wall thickening, or biliary dilatation. Pancreas: Diffusely atrophied. Spleen: Normal in size without focal abnormality. Adrenals/Urinary Tract: Several left renal calculi measuring up to 11 mm in maximum diameter each. Tiny mid upper  right renal calculus on coronal image number 68 series 6. Mild dilatation of the right renal collecting system and extrarenal pelvis with progression. Mildly dilated right ureter to the level of the ureterovesical junction with no obstructing calculus seen. Interval mild  dilatation of the left renal collecting system and ureter to the level of the ureterovesical junction with no obstructing calculus seen. Mildly distended urinary bladder. Stomach/Bowel: Moderately large hiatal hernia. Multiple colonic diverticula without evidence of diverticulitis. Normal appearing appendix and small bowel. Vascular/Lymphatic: Atheromatous arterial calcifications without aneurysm. No enlarged lymph nodes. Reproductive: Status post hysterectomy. No adnexal masses. Other: Left lateral subcutaneous edema and skin thickening. Tiny umbilical hernia containing fat. Musculoskeletal: Stable lumbar spine degenerative changes and scoliosis, including facet degenerative changes with associated grade 1 anterolisthesis at the L4-5 level, without significant change. No fractures or pars defects are seen. IMPRESSION: 1. Left lateral subcutaneous edema and skin thickening compatible with bruising. 2. Small bilateral pleural effusions. 3. Changes of congestive heart failure with interstitial pulmonary edema. 4. Stable 6 mm left lower lobe nodule. The long-term stability is compatible with a benign process. 5. Bilateral nonobstructing renal calculi. 6. Interval mild bilateral hydronephrosis and hydroureter to the level of the ureterovesical junction, without obstructing calculus seen. This is most likely physiological due to bladder distention. 7. Moderately large hiatal hernia. 8. Colonic diverticulosis. Aortic Atherosclerosis (ICD10-I70.0). Electronically Signed   By: Claudie Revering M.D.   On: 12/05/2018 20:38    Cardiac Studies   Echocardiogram 09/2018: Study Conclusions  - Left ventricle: The cavity size was normal. Wall thickness was normal. Systolic function was mildly reduced. The estimated ejection fraction was in the range of 45% to 50%. Features are consistent with a pseudonormal left ventricular filling pattern, with concomitant abnormal relaxation and increased filling pressure  (grade 2 diastolic dysfunction). - Aortic valve: A mechanical prosthesis was present. There was mild regurgitation. Valve area (VTI): 1.43 cm^2. Valve area (Vmax): 1.28 cm^2. Valve area (Vmean): 1.2 cm^2. - Mitral valve: Mildly to moderately calcified annulus. Valve area by continuity equation (using LVOT flow): 1.55 cm^2. - Left atrium: The atrium was mildly dilated.  Patient Profile     77 y.o. female with a PMH of VHD/AS w/mechanical AVR (2007), HTN, HLD, CKD, h/oRbreast cancer treated withsurgery/chemo/XRT, LBBB,and a Parkinson-like syndrome with unsteady gait with falls,and AFibwho is being followed by cardiology for the evaluation of aflutter w/RVR  Assessment & Plan    1. Atrial fibrillation with RVR: felt to be driving by anemia in the setting of recent fall with significant hematoma to left arm/torso and possible GI bleed. HR improved with blood transfusions. Warfarin on hold given bleeding concern. Not on AV nodal blocking agents given history of bradycardia.  - Continue to monitor on telemetry - Resume warfarin when cleared by GI and Hgb stable  2. Flash pulmonary edema in patient with chronic combined CHF: occurred in the setting of patient receiving 2 uPRBC for profound anemia (Hgb 5.6). She was given IV lasix with improvement in symptoms; weaned off BiPAP yesterday. UOP with net -633mL in the past 24 hours. Still with some faint crackles at lung bases on exam. Cr bumped to 1.8 today  - Would hold off on additional lasix given bump in Cr.  - Continue to monitor strict I&Os and daily weights  3. Marked anemia: Hgb 5.6 on admission. CT C/A/P without clear source of bleeding, however patient with recent fall with significant ecchymosis of left arm/torso seen on CT. Also with + occult blood  test for which GI is following with plans for upper endoscopy this admission. She received 2 uPRBC with improvement in Hgb - stable at 9.3 today.  - Continue management per primary  team/GI  4. S/p mechanical AVR: warfarin held on admission given acute anemia. Valve stable on last echo 09/2018. INR down to 2.23 today.  - Resume warfarin when cleared by GI and Hgb stable  5. Acute on chronic renal insufficiency stage 4: Cr up to 1.8 after receiving IV lasix yesterday; baseline ~1.5 - Continue to monitor Cr closely     For questions or updates, please contact [place] Please consult [email or web address] for contact info under Cardiology/STEMI.      Signed, Abigail Butts, PA-C  12/07/2018, 9:52 AM   [phone]

## 2018-12-08 ENCOUNTER — Inpatient Hospital Stay (HOSPITAL_COMMUNITY): Payer: PPO | Admitting: Certified Registered Nurse Anesthetist

## 2018-12-08 ENCOUNTER — Encounter (HOSPITAL_COMMUNITY): Payer: Self-pay | Admitting: Certified Registered Nurse Anesthetist

## 2018-12-08 ENCOUNTER — Encounter (HOSPITAL_COMMUNITY)
Admission: EM | Disposition: A | Discharge status: Home Health | Payer: Self-pay | Source: Home / Self Care | Attending: Internal Medicine

## 2018-12-08 DIAGNOSIS — N183 Chronic kidney disease, stage 3 (moderate): Secondary | ICD-10-CM

## 2018-12-08 HISTORY — PX: ESOPHAGOGASTRODUODENOSCOPY: SHX5428

## 2018-12-08 LAB — CBC
HCT: 28.7 % — ABNORMAL LOW (ref 36.0–46.0)
Hemoglobin: 8.8 g/dL — ABNORMAL LOW (ref 12.0–15.0)
MCH: 30.4 pg (ref 26.0–34.0)
MCHC: 30.7 g/dL (ref 30.0–36.0)
MCV: 99.3 fL (ref 80.0–100.0)
Platelets: 224 10*3/uL (ref 150–400)
RBC: 2.89 MIL/uL — ABNORMAL LOW (ref 3.87–5.11)
RDW: 17.5 % — ABNORMAL HIGH (ref 11.5–15.5)
WBC: 7.1 10*3/uL (ref 4.0–10.5)
nRBC: 0 % (ref 0.0–0.2)

## 2018-12-08 LAB — BASIC METABOLIC PANEL
Anion gap: 11 (ref 5–15)
BUN: 27 mg/dL — ABNORMAL HIGH (ref 8–23)
CO2: 29 mmol/L (ref 22–32)
Calcium: 8.1 mg/dL — ABNORMAL LOW (ref 8.9–10.3)
Chloride: 97 mmol/L — ABNORMAL LOW (ref 98–111)
Creatinine, Ser: 1.33 mg/dL — ABNORMAL HIGH (ref 0.44–1.00)
GFR calc Af Amer: 44 mL/min — ABNORMAL LOW (ref >60)
GFR calc non Af Amer: 38 mL/min — ABNORMAL LOW (ref >60)
Glucose, Bld: 116 mg/dL — ABNORMAL HIGH (ref 70–99)
Potassium: 3.8 mmol/L (ref 3.5–5.1)
Sodium: 137 mmol/L (ref 135–145)

## 2018-12-08 LAB — PROTIME-INR
INR: 1.8
Prothrombin Time: 20.7 seconds — ABNORMAL HIGH (ref 11.4–15.2)

## 2018-12-08 SURGERY — EGD (ESOPHAGOGASTRODUODENOSCOPY)
Anesthesia: Monitor Anesthesia Care

## 2018-12-08 MED ORDER — AMIODARONE HCL 200 MG PO TABS
200.0000 mg | ORAL_TABLET | Freq: Every day | ORAL | Status: DC
  Administered 2018-12-08 – 2018-12-09 (×2): 200 mg via ORAL
  Filled 2018-12-08 (×2): qty 1

## 2018-12-08 MED ORDER — LIDOCAINE 2% (20 MG/ML) 5 ML SYRINGE
INTRAMUSCULAR | Status: DC | PRN
  Administered 2018-12-08: 60 mg via INTRAVENOUS

## 2018-12-08 MED ORDER — PHENYLEPHRINE 40 MCG/ML (10ML) SYRINGE FOR IV PUSH (FOR BLOOD PRESSURE SUPPORT)
PREFILLED_SYRINGE | INTRAVENOUS | Status: DC | PRN
  Administered 2018-12-08: 120 ug via INTRAVENOUS
  Administered 2018-12-08: 80 ug via INTRAVENOUS
  Administered 2018-12-08: 120 ug via INTRAVENOUS

## 2018-12-08 MED ORDER — WARFARIN SODIUM 7.5 MG PO TABS
7.5000 mg | ORAL_TABLET | Freq: Once | ORAL | Status: AC
  Administered 2018-12-08: 7.5 mg via ORAL
  Filled 2018-12-08: qty 1

## 2018-12-08 MED ORDER — PANTOPRAZOLE SODIUM 40 MG PO TBEC
40.0000 mg | DELAYED_RELEASE_TABLET | Freq: Every day | ORAL | Status: DC
  Administered 2018-12-09: 40 mg via ORAL
  Filled 2018-12-08: qty 1

## 2018-12-08 MED ORDER — WARFARIN - PHARMACIST DOSING INPATIENT
Freq: Every day | Status: DC
  Administered 2018-12-08: 15:00:00

## 2018-12-08 MED ORDER — PROPOFOL 10 MG/ML IV BOLUS
INTRAVENOUS | Status: DC | PRN
  Administered 2018-12-08: 15 mg via INTRAVENOUS
  Administered 2018-12-08 (×3): 20 mg via INTRAVENOUS

## 2018-12-08 MED ORDER — LACTATED RINGERS IV SOLN
INTRAVENOUS | Status: DC
  Administered 2018-12-08: 11:00:00 via INTRAVENOUS

## 2018-12-08 MED ORDER — BUTAMBEN-TETRACAINE-BENZOCAINE 2-2-14 % EX AERO
INHALATION_SPRAY | CUTANEOUS | Status: DC | PRN
  Administered 2018-12-08: 2 via TOPICAL

## 2018-12-08 MED ORDER — PROPOFOL 500 MG/50ML IV EMUL
INTRAVENOUS | Status: DC | PRN
  Administered 2018-12-08: 100 ug/kg/min via INTRAVENOUS

## 2018-12-08 NOTE — Transfer of Care (Signed)
Immediate Anesthesia Transfer of Care Note  Patient: [name]  Procedure(s) Performed: ESOPHAGOGASTRODUODENOSCOPY (EGD) (N/A )  Patient Location: Endoscopy Unit  Anesthesia Type:MAC  Level of Consciousness: drowsy and responds to stimulation  Airway & Oxygen Therapy: Patient Spontanous Breathing and Patient connected to nasal cannula oxygen  Post-op Assessment: Report given to RN and Post -op Vital signs reviewed and stable  Post vital signs: Reviewed and stable  Last Vitals:  Vitals Value Taken Time  BP 100/57 12/08/2018 11:32 AM  Temp    Pulse 83 12/08/2018 11:33 AM  Resp 17 12/08/2018 11:33 AM  SpO2 96 % 12/08/2018 11:33 AM  Vitals shown include unvalidated device data.  Last Pain:  Vitals:   12/08/18 1034  TempSrc: Oral  PainSc: 0-No pain      Patients Stated Pain Goal: 0 (56/86/16 [id number])  Complications: No apparent anesthesia complications

## 2018-12-08 NOTE — Op Note (Signed)
[facility] Patient Name: [name] Procedure Date : 12/08/2018 MRN: [id number] Attending MD: Estill Cotta. Loletha Carrow , MD Date of Birth: 01-Mar-1941 CSN: [id number] Age: 77 Admit Type: Inpatient Procedure:                Upper GI endoscopy Indications:              Heme positive stool (and reported recent episode of                            black stool) Providers:                Estill Cotta. Loletha Carrow, MD, Burtis Junes, RN, Tinnie Gens,                            Technician, Lavona Mound, CRNA Referring MD:             [facility] Medicines:                Monitored Anesthesia Care Complications:            No immediate complications. Estimated Blood Loss:     Estimated blood loss: none. Procedure:                Pre-Anesthesia Assessment:                           - Prior to the procedure, a History and Physical                            was performed, and patient medications and                            allergies were reviewed. The patient's tolerance of                            previous anesthesia was also reviewed. The risks                            and benefits of the procedure and the sedation                            options and risks were discussed with the patient.                            All questions were answered, and informed consent                            was obtained. Prior Anticoagulants: The patient has                            taken Coumadin (warfarin), last dose was 4 days                            prior to procedure. ASA Grade Assessment: III - A  patient with severe systemic disease. After                            reviewing the risks and benefits, the patient was                            deemed in satisfactory condition to undergo the                            procedure.                           After obtaining informed consent, the endoscope was                            passed under direct vision.  Throughout the                            procedure, the patient's blood pressure, pulse, and                            oxygen saturations were monitored continuously. The                            [id number]-H190 ([id number] Olympus Adult EGD was introduced                            through the mouth, and advanced to the second part                            of duodenum. The upper GI endoscopy was                            accomplished without difficulty. The patient                            tolerated the procedure well. Scope In: Scope Out: Findings:      The lower third of the esophagus was tortuous (because of the hiatal       hernia).      A 10-12 cm hiatal hernia with a para-esophageal component was present.      A few dispersed, small non-bleeding erosions were found in the gastric       fundus (within the hernia sac). There were no stigmata of recent       bleeding. Approximately 1/3 to 1/2 of the stomach is herniated into the       chest (similar size to on prior exam).      The exam of the stomach was otherwise normal, including on retroflexion.      The examined duodenum was normal. Impression:               - Tortuous esophagus.                           - 10 cm hiatal hernia.                           -  Non-bleeding erosive gastropathy.                           - Normal examined duodenum.                           - No specimens collected.                           The gastric erosions within the hernia ("Cameron                            erosions") may explain the heme positive stool, but                            are not suspected to be the major cause of chronic                            anemia (see recent inpatient GI consult note). Recommendation:           - Return patient to hospital ward for ongoing care.                           - Resume Coumadin (warfarin) at prior dose today.                           - Resume regular diet.                           - No  further endoscopic workup planned. 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 All rights reserved. The codes documented in this report are preliminary and upon coder review may  be revised to meet current compliance requirements. Lashanta Elbe L. Loletha Carrow, MD 12/08/2018 11:38:22 AM This report has been signed electronically. Number of Addenda: 0

## 2018-12-08 NOTE — Anesthesia Preprocedure Evaluation (Signed)
Anesthesia Evaluation  Patient identified by MRN, date of birth, ID band Patient awake    Reviewed: Allergy & Precautions, NPO status , Patient's Chart, lab work & pertinent test results  History of Anesthesia Complications Negative for: history of anesthetic complications  Airway Mallampati: II  TM Distance: >3 FB Neck ROM: Full    Dental no notable dental hx.    Pulmonary  Pulmonary edema 2/2 CHF   breath sounds clear to auscultation       Cardiovascular hypertension, Pt. on medications +CHF  + dysrhythmias Atrial Fibrillation + Valvular Problems/Murmurs (s/p mech AVR) AS and AI  Rhythm:Irregular Rate:Normal + Systolic Click (mechanical valve click)    Neuro/Psych  Neuromuscular disease (Parkinson's) negative psych ROS   GI/Hepatic Neg liver ROS, hiatal hernia, GERD  Controlled,  Endo/Other  negative endocrine ROS  Renal/GU ARF and Renal InsufficiencyRenal disease  negative genitourinary   Musculoskeletal negative musculoskeletal ROS (+)   Abdominal   Peds  Hematology  (+) anemia ,   Anesthesia Other Findings 77 yo F for EGD - Parkinson's, acute respiratory insufficiency requiring BiPAP (now on [place]), A fib w/ RVR, CHF (EF 45-50), AS s/p mech AVR, AKI on CKD, anemia (5.6 on admission, s/p 2U PRBC, now 8.8) - TTE 10/17/18: EF 45-50%, grade 2 DD, s/p mech AVR with mild AI, mild LAD  Reproductive/Obstetrics                            Anesthesia Physical Anesthesia Plan  ASA: IV  Anesthesia Plan: MAC   Post-op Pain Management:    Induction: Intravenous  PONV Risk Score and Plan: 2 and Propofol infusion, TIVA and Treatment may vary due to age or medical condition  Airway Management Planned: Nasal Cannula and Simple Face Mask  Additional Equipment: None  Intra-op Plan:   Post-operative Plan:   Informed Consent: I have reviewed the patients History and Physical, chart, labs and  discussed the procedure including the risks, benefits and alternatives for the proposed anesthesia with the patient or authorized representative who has indicated his/her understanding and acceptance.     Plan Discussed with:   Anesthesia Plan Comments:         Anesthesia Quick Evaluation

## 2018-12-08 NOTE — Progress Notes (Signed)
ANTICOAGULATION CONSULT NOTE - Initial Consult  Pharmacy Consult for  Warfarin  Indication: h/o mechanical aortic valve and Afib.  Allergies  Allergen Reactions  . Demerol [Meperidine] Shortness Of Breath and Swelling  . Oxycodone Other (See Comments)    Hallucinations   . Adhesive [Tape] Other (See Comments)    Redness and skin tears  . Ivp Dye [Iodinated Diagnostic Agents] Hives    OK with benadryl pre-med (27m one hour before receiving iodinated contrast agent)  . Phenergan [Promethazine] Other (See Comments)    Avoids due to reaction with current medications    Patient Measurements: Height: _0  (149.9 cm) Weight: 137 lb (62.1 kg) IBW/kg (Calculated) : 43.2   Vital Signs: Temp: 97.8 F (36.6 C) (12/13 1140) Temp Source: Oral (12/13 1140) BP: 114/37 (12/13 1200) Pulse Rate: 86 (12/13 1205)  Labs: Recent Labs    12/05/18 1854 12/06/18 0739 12/07/18 0435 12/08/18 0349  HGB 9.4* 8.9* 9.3* 8.8*  HCT 29.3* 28.3* 29.7* 28.7*  PLT 221 220 218 224  LABPROT 28.7* 33.6* 24.9* 20.7*  INR 2.75 3.37 2.29 1.80  CREATININE  --  1.52* 1.80* 1.33*  TROPONINI 0.07*  --   --   --     Estimated Creatinine Clearance: 28 mL/min (A) (by C-G formula based on SCr of 1.33 mg/dL (H)).   Medical History: Past Medical History:  Diagnosis Date  . Arthropathy of cervical facet joint    C2-3  . At risk for falls   . BPV (benign positional vertigo)   . [place]    breast-lumpectomy,nodes ,radiation,chemo  . Chronic neck pain   . CKD (chronic kidney disease), stage II   . Diverticulosis of colon   . Elevated troponin    a. 08/2016 in setting of PAF;  b. 08/2016 low risk MV w/ fixed septal defect (LBBB), EF 53%.  .Marland KitchenGERD (gastroesophageal reflux disease)   . Hiatal hernia   . History of aortic valve stenosis   . History of breast cancer per pt no recurrence   dx 2004-- Left breast DCIS (ER/PR+, HER2 negative) s/p  partial masectomy w/ sln bx and  chemoradiotion  (completed 2004)  . History of colon polyps   . History of herpes zoster   . History of kidney stones   . HTN (hypertension)   . Hyperlipidemia   . Hypertrophy of inter-atrial septum    a. 09/2014 Echo: ? of atrial mass; b. 10/2014 Cardiac MRI: no atrial septal mass - polypoid lipomatous hypertrophy of superior atrial septum noted-->Benign.  . Irritable bowel syndrome   . LBBB (left bundle branch block)   . PAF (paroxysmal atrial fibrillation) (HCC)    a. CHA2DS2VASc = 4-->coumadin.  . Parkinsonism ([facility]    neurologist-  dr tat  . Personal history of chemotherapy 2004  . Personal history of radiation therapy 2004  . PONV (postoperative nausea and vomiting)   . PVC's (premature ventricular contractions)   . Right ureteral stone   . S/P aortic valve replacement with prosthetic valve    a. 04/13/2006 s/p [employer] Jude mechnical AVR for severe AS;  b. 09/2014 Echo: EF 40-45%, gr1 DD, mild AI/MR, triv TR/PR.  . SUI (stress urinary incontinence, female)   . Symptomatic anemia   . Venous varices      Medications:  Medications Prior to Admission  Medication Sig Dispense Refill Last Dose  . acetaminophen (TYLENOL) 500 MG tablet Take 1,000 mg by mouth every 6 (six) hours as needed (head pain).  12/04/2018 at Unknown time  . calcium carbonate (TUMS - DOSED IN MG ELEMENTAL CALCIUM) 500 MG chewable tablet Chew 1 tablet by mouth daily.   12/04/2018 at Unknown time  . calcium citrate-vitamin D (CITRACAL+D) 315-200 MG-UNIT per tablet Take 1 tablet by mouth 3 (three) times daily.   12/04/2018 at Unknown time  . carbidopa-levodopa (SINEMET IR) 25-100 MG tablet TAKE 2 TABLETS BY MOUTH THREE TIMES DAILY (Patient taking differently: Take 2 tablets by mouth 3 (three) times daily. ) 540 tablet 1 12/04/2018 at Unknown time  . Cholecalciferol (VITAMIN D3) 2000 units TABS Take 1 capsule by mouth daily.   12/04/2018 at Unknown time  . famotidine-calcium carbonate-magnesium hydroxide (PEPCID COMPLETE) 10-800-165 MG CHEW  chewable tablet Chew 1 tablet by mouth daily as needed (heartburn).   Past Week at Unknown time  . KLOR-CON M20 20 MEQ tablet Take 20 mEq by mouth daily.   12/04/2018 at Unknown time  . KRILL OIL PO Take 1 capsule by mouth daily.    12/04/2018 at Unknown time  . Melatonin 3 MG TABS Take 3 mg by mouth at bedtime as needed (sleep).    Past Week at Unknown time  . Multiple Vitamin (MULTIVITAMIN) tablet Take 1 tablet by mouth daily at 12 noon.    12/04/2018 at Unknown time  . Multiple Vitamins-Minerals (VISION-VITE PRESERVE PO) Take 1 tablet by mouth 2 (two) times daily.   12/04/2018 at Unknown time  . pantoprazole (PROTONIX) 40 MG tablet Take 40 mg by mouth daily.   12/04/2018 at Unknown time  . Probiotic Product (PROBIOTIC-10 PO) Take 1 capsule by mouth daily.    12/04/2018 at Unknown time  . simvastatin (ZOCOR) 20 MG tablet Take 20 mg by mouth every evening.   12/04/2018 at Unknown time  . triamterene-hydrochlorothiazide (MAXZIDE-25) 37.5-25 MG per tablet Take 1 tablet by mouth every morning.    12/04/2018 at Unknown time  . vitamin B-12 (CYANOCOBALAMIN) 1000 MCG tablet Take 1,000 mcg by mouth every morning.   12/04/2018 at Unknown time  . warfarin (COUMADIN) 5 MG tablet Take 5 mg by mouth. Take 7.5 mg by mouth every Monday, Wednesday, Friday. Take 5 mg by mouth all other days.   12/03/2018   Scheduled:  . carbidopa-levodopa  2 tablet Oral TID  . chlorhexidine  15 mL Mouth Rinse BID  . HYDROcodone-acetaminophen  2 tablet Oral Once  . mouth rinse  15 mL Mouth Rinse q12n4p  . [START ON 12/09/2018] pantoprazole  40 mg Oral QAC breakfast    Assessment: 77 y.o female on Warfarin PTA for h/o mechanical aortic valve and Afib. --  PTA dose 7.85m MWF, 510mTTSS.  -Last dose taken on 12/8   Warfarin was held since admit. Hgb was 5.6 on admit. Chronic, mild macrocytic anemia now with acute on chronic anemia (with normal MCV)  in setting of extensive hematoma post fall as well CKD and dark stool heme positive  stools on anticoagulant. No further bleeding since admit.  -s/p EGD today 12/13: no bleeding found. Pharmacy consulted to restart warfarin.   Today INR  1.80,  Hgb low/stable, pltc wnl  Goal of Therapy:  INR 2-3 Monitor platelets by anticoagulation protocol: Yes   Plan:  Warfarin 7.5 mg po x1 now Daily INR, will dose daily based on INR results.  RuNicole CellaRPh Clinical Pharmacist [phone] check AMION for all MCSweetserhone numbers After 10:00 PM, call [phone]/13/2019,1:22 PM

## 2018-12-08 NOTE — Progress Notes (Signed)
Progress Note  Patient Name: [name] Date of Encounter: 12/08/2018  Primary Cardiologist: Peter Martinique, MD   Subjective   Feeling better today.  No SOB or CP.  Denies any palpitations.  In and out of atrial fibrillation/flutter on tele.    Inpatient Medications    Scheduled Meds: . carbidopa-levodopa  2 tablet Oral TID  . chlorhexidine  15 mL Mouth Rinse BID  . HYDROcodone-acetaminophen  2 tablet Oral Once  . mouth rinse  15 mL Mouth Rinse q12n4p  . pantoprazole (PROTONIX) IV  40 mg Intravenous Q12H   Continuous Infusions:  PRN Meds: acetaminophen, alum & mag hydroxide-simeth, ondansetron (ZOFRAN) IV   Vital Signs    Vitals:   12/07/18 0300 12/07/18 1432 12/07/18 2145 12/08/18 0554  BP:  (!) 93/57 (!) 119/57 (!) 107/55  Pulse:  70 81 87  Resp:  18 18 18   Temp: 98.3 F (36.8 C) 98.6 F (37 C) 99.4 F (37.4 C) 98.7 F (37.1 C)  TempSrc:  Oral Oral Oral  SpO2:  100% 100% 99%  Weight:      Height:        Intake/Output Summary (Last 24 hours) at 12/08/2018 0939 Last data filed at 12/08/2018 0800 Gross per 24 hour  Intake 852 ml  Output -  Net 852 ml   Filed Weights   12/05/18 0719  Weight: 62.1 kg    Telemetry    Atrial flutter with CVR at 90bpm - Personally Reviewed  ECG    No new EKG to review - Personally Reviewed  Physical Exam   GEN: No acute distress.   Neck: No JVD Cardiac: RRR, no rubs, or gallops. Normal mechanical AV click and 1/6 SM at RUSB Respiratory: Clear to auscultation bilaterally. GI: Soft, nontender, non-distended  MS: No edema; No deformity. Neuro:  Nonfocal  Psych: Normal affect   Labs    Chemistry Recent Labs  Lab 12/06/18 0739 12/07/18 0435 12/08/18 0349  NA 136 139 137  K 2.9* 3.4* 3.8  CL 97* 99 97*  CO2 26 27 29   GLUCOSE 98 109* 116*  BUN 29* 34* 27*  CREATININE 1.52* 1.80* 1.33*  CALCIUM 8.2* 7.9* 8.1*  GFRNONAA 32* 26* 38*  GFRAA 38* 31* 44*  ANIONGAP 13 13 11      Hematology Recent  Labs  Lab 12/06/18 0739 12/07/18 0435 12/08/18 0349  WBC 6.3 6.3 7.1  RBC 2.93* 3.04* 2.89*  HGB 8.9* 9.3* 8.8*  HCT 28.3* 29.7* 28.7*  MCV 96.6 97.7 99.3  MCH 30.4 30.6 30.4  MCHC 31.4 31.3 30.7  RDW 18.7* 18.0* 17.5*  PLT 220 218 224    Cardiac Enzymes Recent Labs  Lab 12/05/18 1854  TROPONINI 0.07*    Recent Labs  Lab 12/05/18 0732  TROPIPOC 0.05     BNPNo results for input(s): BNP, PROBNP in the last 168 hours.   DDimer  Recent Labs  Lab 12/05/18 0715  DDIMER 3.90*     Radiology    No results found.  Cardiac Studies   Echocardiogram 09/2018: Study Conclusions  - Left ventricle: The cavity size was normal. Wall thickness was normal. Systolic function was mildly reduced. The estimated ejection fraction was in the range of 45% to 50%. Features are consistent with a pseudonormal left ventricular filling pattern, with concomitant abnormal relaxation and increased filling pressure (grade 2 diastolic dysfunction). - Aortic valve: A mechanical prosthesis was present. There was mild regurgitation. Valve area (VTI): 1.43 cm^2. Valve area (Vmax): 1.28 cm^2.  Valve area (Vmean): 1.2 cm^2. - Mitral valve: Mildly to moderately calcified annulus. Valve area by continuity equation (using LVOT flow): 1.55 cm^2. - Left atrium: The atrium was mildly dilated.  Patient Profile     77 y.o. female with a PMH ofVHD/AS w/mechanical AVR (2007), HTN, HLD, CKD, h/oRbreast cancer treated withsurgery/chemo/XRT, LBBB,and a Parkinson-like syndrome with unsteady gait with falls,and AFibwho is beingfollowed by cardiologyfor the evaluation of aflutter w/RVR  Assessment & Plan    1. Atrial fibrillation with RVR: felt to be driven by anemia in the setting of recent fall with significant hematoma to left arm/torso and possible GI bleed as well as acute respiratory faliure.  -she is in and out of afib/flutter and NSR with PACs -currently appears to be atrial  flutter on monitor but there is a lot of wandering baseline artifact - will check 12lead EKG -Warfarin on hold given anemia requiring PRBCs -Not on AV nodal blocking agents given history of bradycardia (did not tolerate Amio as well in the past due to bradycardia) -she was in afib at last OV with Dr. Martinique and rate controlled and due to hx of tachybrady syndrome with severe bradycardia on BB he recommended not starting AAD therapy since she was asymptomatic   -unfortunately now she is having bouts of significant tachycardia when in afib/flutter -will ask EP to consult in regards to considering AAD therapy (Tikosyn) to suppress rapid afib -Continue to monitor on telemetry -check EKG today to determine rhythm currently -Resume warfarin when cleared by GI and Hgb stable  2. Flash pulmonary edema in patient with chronic combined CHF:  -occurred in the setting of patient receiving 2 uPRBC for profound anemia (Hgb 5.6).  -received IV lasix with improvement in symptoms and weaned off BiPAP -I&O's incomplete -scattered expiratory wheezes on exam but no crackles -diuretics held yesterday due to bump in creatinine to 1.8 -creatinine back down to 1.33 today -Continue to monitor strict I&Os and daily weights -hold on any further Lasix for now  3. Marked anemia: Hgb 5.6 on admission.  -CT C/A/P without clear source of bleeding, however patient with recent fall with significant ecchymosis of left arm/torso seen on CT. Also with + occult blood test for which GI is following  -endoscopy planned for today -She received 2 uPRBC with improvement in Hgb  -Hbg 9.3 yesterday and down to 8.8 today  -Continue management per primary team/GI  4. S/p mechanical AVR:  -warfarin held on admission given acute anemia.  -Valve stable on last echo 09/2018. INR down to 2.23 today.  - anticoagulation not reversed but warfarin held and INR has drifted down.  Today INR 1.8 -Resume warfarin when cleared by GI and Hgb  stable  5. Acute on chronic renal insufficiency stage 4:  -Cr up to 1.8 after receiving IV lasix - baseline ~1.5 -creatinine back down to 1.33 today after diuretics stopped - Continue to monitor Cr closely      For questions or updates, please contact [facility] Please consult [email or web address] for contact info under Cardiology/STEMI.      Signed, Fransico Him, MD  12/08/2018, 9:39 AM

## 2018-12-08 NOTE — Care Management Important Message (Signed)
Important Message  Patient Details  Name: [name] MRN: [id number] Date of Birth: September 12, 1941   Medicare Important Message Given:  Yes  Pls disregard signture .   Maytal Mijangos 12/08/2018, 3:37 PM

## 2018-12-08 NOTE — Progress Notes (Signed)
PROGRESS NOTE        PATIENT DETAILS Name: [name] Age: 77 y.o. Sex: female Date of Birth: Feb 12, 1941 Admit Date: 12/05/2018 Admitting Physician Karmen Bongo, MD PPJ:[email or web address], Quillian Quince, MD  Brief Narrative: Patient is a 77 y.o. female with history of A. fib on anticoagulation, chronic systolic heart failure, aortic valve Parkinson's disease-with recent history of mechanical fall complicated by significant bruising of her left torso area-presented to the hospital for evaluation of chest pain, she was found to have a hemoglobin of 5.6.  There was some concern for recent GI bleeding-she was transfused 2 units of PRBC and admitted to the hospitalist service.  Hospital course further complicated by development of acute hypoxic respiratory failure in the setting of pulmonary edema requiring BiPAP.  See below for further details  Subjective: Feels better-no shortness of breath.  No melanotic stools since hospitalization.  Assessment/Plan: Acute hypoxic respiratory failure secondary to pulmonary edema/acute on chronic systolic heart failure: Respiratory failure has resolved-this is likely secondary to pulmonary edema in the setting of blood transfusion in a patient with chronic systolic heart failure.   Anemia: Felt to be multifactorial-appears to have chronic normocytic anemia at baseline-worsening anemia felt to be secondary to extensive subcutaneous bruising/ecchymosis after a fall, and probably recent GI bleeding.  Has been transfused 2 units of PRBC on 12/10-hemoglobin remained stable.  Underwent EGD earlier this morning-no bleeding foci.  Continue to follow CBC periodically  ?  GI bleeding: Concern for upper GI bleeding-does give history of some melanotic stools over the past few days-however none since admission-GI following-underwent EGD this morning that was unremarkable.  Per GI okay to resume Coumadin.     A. fib with RVR: Rate overall better-but  continues to have bouts of tachycardia-cardiology following-with plans for EP evaluation later today.  Resuming Coumadin today.    Mechanical aortic valve replacement: Coumadin in anticipation of EGD and severity of anemia-since EGD is unremarkable-resuming Coumadin today.    Elevated troponin: Trend is flat and not consistent with ACS-cardiology does not recommend any further ischemic evaluation at this time.  AKI on CKD stage III: AKI hemodynamically mediated-improving with supportive care.  No longer on Lasix.    Parkinson's disease: Stable-continue Sinemet..   She may have autonomic dysfunction associated with Parkinson's disease contributing to her recent falls.  Recent mechanical fall: Suspect she may have autonomic dysfunction associated with Parkinson's disease-PT evaluation completed-recommendations are for home health services.  Dyslipidemia: Resume statin over the next few days.  Chronic debility/deconditioning: Secondary to Parkinson's disease-may have worsened due to acute illness-PT evaluation completed-recommendations are for home health services  DVT Prophylaxis: Coumadin   Code Status: Full code  Family Communication: Daughter at bedside  Disposition Plan: Remain inpatient-home over the weekend-once EP evaluation has been completed.  Antimicrobial agents: Anti-infectives (From admission, onward)   None      Procedures: None  CONSULTS:  cardiology and GI  Time spent: 25 minutes-Greater than 50% of this time was spent in counseling, explanation of diagnosis, planning of further management, and coordination of care.   MEDICATIONS: Scheduled Meds: . carbidopa-levodopa  2 tablet Oral TID  . chlorhexidine  15 mL Mouth Rinse BID  . HYDROcodone-acetaminophen  2 tablet Oral Once  . mouth rinse  15 mL Mouth Rinse q12n4p  . pantoprazole (PROTONIX) IV  40 mg Intravenous Q12H   Continuous Infusions:  PRN Meds:.acetaminophen, alum & mag hydroxide-simeth,  ondansetron (ZOFRAN) IV   PHYSICAL EXAM: Vital signs: Vitals:   12/08/18 1150 12/08/18 1155 12/08/18 1200 12/08/18 1205  BP: (!) 95/43  (!) 114/37   Pulse: 91 79 89 86  Resp: (!) 25 (!) 21 20 (!) 22  Temp:      TempSrc:      SpO2: 93% 93% 93% 93%  Weight:      Height:       Filed Weights   12/05/18 0719 12/08/18 1034  Weight: 62.1 kg 62.1 kg   Body mass index is 27.67 kg/m.   General appearance:Awake, alert, not in any distress.  Eyes:no scleral icterus. HEENT: Atraumatic and Normocephalic Neck: supple, no JVD. Resp:Good air entry bilaterally,+ metallic click CVS: S1 S2 regular, no murmurs.  GI: Bowel sounds present, Non tender and not distended with no gaurding, rigidity or rebound. Extremities: B/L Lower Ext shows no edema, both legs are warm to touch Neurology:  Non focal-but has generalized weakness Psychiatric: Normal judgment and insight. Normal mood. Musculoskeletal:No digital cyanosis Skin:No Rash, warm and dry Wounds:N/A  I have personally reviewed following labs and imaging studies  LABORATORY DATA: CBC: Recent Labs  Lab 12/05/18 0715 12/05/18 1854 12/06/18 0739 12/07/18 0435 12/08/18 0349  WBC 8.3 8.0 6.3 6.3 7.1  NEUTROABS 5.7  --   --   --   --   HGB 5.6* 9.4* 8.9* 9.3* 8.8*  HCT 18.5* 29.3* 28.3* 29.7* 28.7*  MCV 107.6* 96.4 96.6 97.7 99.3  PLT 226 221 220 218 962    Basic Metabolic Panel: Recent Labs  Lab 12/05/18 0715 12/05/18 0850 12/06/18 0739 12/07/18 0435 12/08/18 0349  NA 135  --  136 139 137  K 3.8  --  2.9* 3.4* 3.8  CL 97*  --  97* 99 97*  CO2 27  --  26 27 29   GLUCOSE 110*  --  98 109* 116*  BUN 39*  --  29* 34* 27*  CREATININE 1.83*  --  1.52* 1.80* 1.33*  CALCIUM 9.1  --  8.2* 7.9* 8.1*  MG  --  1.7  --   --   --     GFR: Estimated Creatinine Clearance: 28 mL/min (A) (by C-G formula based on SCr of 1.33 mg/dL (H)).  Liver Function Tests: No results for input(s): AST, ALT, ALKPHOS, BILITOT, PROT, ALBUMIN in the  last 168 hours. No results for input(s): LIPASE, AMYLASE in the last 168 hours. No results for input(s): AMMONIA in the last 168 hours.  Coagulation Profile: Recent Labs  Lab 12/05/18 0715 12/05/18 1854 12/06/18 0739 12/07/18 0435 12/08/18 0349  INR 3.45 2.75 3.37 2.29 1.80    Cardiac Enzymes: Recent Labs  Lab 12/05/18 1854  TROPONINI 0.07*    BNP (last 3 results) No results for input(s): PROBNP in the last 8760 hours.  HbA1C: No results for input(s): HGBA1C in the last 72 hours.  CBG: No results for input(s): GLUCAP in the last 168 hours.  Lipid Profile: No results for input(s): CHOL, HDL, LDLCALC, TRIG, CHOLHDL, LDLDIRECT in the last 72 hours.  Thyroid Function Tests: No results for input(s): TSH, T4TOTAL, FREET4, T3FREE, THYROIDAB in the last 72 hours.  Anemia Panel: No results for input(s): VITAMINB12, FOLATE, FERRITIN, TIBC, IRON, RETICCTPCT in the last 72 hours.  Urine analysis:    Component Value Date/Time   COLORURINE RED (A) 09/19/2016 0608   APPEARANCEUR TURBID (A) 09/19/2016 0608   LABSPEC 1.020 09/19/2016 0608   PHURINE  7.5 09/19/2016 0608   GLUCOSEU 100 (A) 09/19/2016 0608   HGBUR LARGE (A) 09/19/2016 0608   BILIRUBINUR MODERATE (A) 09/19/2016 0608   KETONESUR 15 (A) 09/19/2016 0608   PROTEINUR 100 (A) 09/19/2016 0608   NITRITE POSITIVE (A) 09/19/2016 0608   LEUKOCYTESUR SMALL (A) 09/19/2016 0608    Sepsis Labs: Lactic Acid, Venous No results found for: LATICACIDVEN  MICROBIOLOGY: Recent Results (from the past 240 hour(s))  MRSA PCR Screening     Status: None   Collection Time: 12/05/18  3:13 PM  Result Value Ref Range Status   MRSA by PCR NEGATIVE NEGATIVE Final    Comment:        The GeneXpert MRSA Assay (FDA approved for NASAL specimens only), is one component of a comprehensive MRSA colonization surveillance program. It is not intended to diagnose MRSA infection nor to guide or monitor treatment for MRSA  infections. Performed at [facility], [place] [address], [place], [place] [address]     RADIOLOGY STUDIES/RESULTS: Ct Abdomen Pelvis Wo Contrast  Result Date: 12/05/2018 CLINICAL DATA:  Chest pain and chronic shortness of breath. Minor blunt abdominal trauma. EXAM: CT CHEST, ABDOMEN AND PELVIS WITHOUT CONTRAST TECHNIQUE: Multidetector CT imaging of the chest, abdomen and pelvis was performed following the standard protocol without IV contrast. COMPARISON:  Portable chest obtained earlier today. Chest and abdomen radiographs dated 09/19/2016. Abdomen and pelvis CT dated 08/10/2016. FINDINGS: CT CHEST FINDINGS Cardiovascular: Atheromatous calcifications, including the coronary arteries and aorta. Post CABG changes. Borderline enlarged heart. Dense mitral valve annulus calcifications. Mediastinum/Nodes: No enlarged mediastinal, hilar, or axillary lymph nodes. Thyroid gland, trachea, and esophagus demonstrate no significant findings. A moderately large hiatal hernia is noted. Lungs/Pleura: Small bilateral pleural effusions. Interstitial thickening throughout the majority of both lungs. Seven by 6 mm nodule in the posterolateral aspect of the left lower lobe on image number 114 series 5, confirmed on the sagittal and coronal reconstruction images. This is unchanged since 08/10/2016. No new nodules are seen. Musculoskeletal: Thoracic and lower cervical spine degenerative changes. CT ABDOMEN PELVIS FINDINGS Hepatobiliary: No focal liver abnormality is seen. No gallstones, gallbladder wall thickening, or biliary dilatation. Pancreas: Diffusely atrophied. Spleen: Normal in size without focal abnormality. Adrenals/Urinary Tract: Several left renal calculi measuring up to 11 mm in maximum diameter each. Tiny mid upper right renal calculus on coronal image number 68 series 6. Mild dilatation of the right renal collecting system and extrarenal pelvis with progression. Mildly dilated right ureter to the level of the  ureterovesical junction with no obstructing calculus seen. Interval mild dilatation of the left renal collecting system and ureter to the level of the ureterovesical junction with no obstructing calculus seen. Mildly distended urinary bladder. Stomach/Bowel: Moderately large hiatal hernia. Multiple colonic diverticula without evidence of diverticulitis. Normal appearing appendix and small bowel. Vascular/Lymphatic: Atheromatous arterial calcifications without aneurysm. No enlarged lymph nodes. Reproductive: Status post hysterectomy. No adnexal masses. Other: Left lateral subcutaneous edema and skin thickening. Tiny umbilical hernia containing fat. Musculoskeletal: Stable lumbar spine degenerative changes and scoliosis, including facet degenerative changes with associated grade 1 anterolisthesis at the L4-5 level, without significant change. No fractures or pars defects are seen. IMPRESSION: 1. Left lateral subcutaneous edema and skin thickening compatible with bruising. 2. Small bilateral pleural effusions. 3. Changes of congestive heart failure with interstitial pulmonary edema. 4. Stable 6 mm left lower lobe nodule. The long-term stability is compatible with a benign process. 5. Bilateral nonobstructing renal calculi. 6. Interval mild bilateral hydronephrosis and hydroureter  to the level of the ureterovesical junction, without obstructing calculus seen. This is most likely physiological due to bladder distention. 7. Moderately large hiatal hernia. 8. Colonic diverticulosis. Aortic Atherosclerosis (ICD10-I70.0). Electronically Signed   By: Claudie Revering M.D.   On: 12/05/2018 20:38   Ct Chest Wo Contrast  Result Date: 12/05/2018 CLINICAL DATA:  Chest pain and chronic shortness of breath. Minor blunt abdominal trauma. EXAM: CT CHEST, ABDOMEN AND PELVIS WITHOUT CONTRAST TECHNIQUE: Multidetector CT imaging of the chest, abdomen and pelvis was performed following the standard protocol without IV contrast.  COMPARISON:  Portable chest obtained earlier today. Chest and abdomen radiographs dated 09/19/2016. Abdomen and pelvis CT dated 08/10/2016. FINDINGS: CT CHEST FINDINGS Cardiovascular: Atheromatous calcifications, including the coronary arteries and aorta. Post CABG changes. Borderline enlarged heart. Dense mitral valve annulus calcifications. Mediastinum/Nodes: No enlarged mediastinal, hilar, or axillary lymph nodes. Thyroid gland, trachea, and esophagus demonstrate no significant findings. A moderately large hiatal hernia is noted. Lungs/Pleura: Small bilateral pleural effusions. Interstitial thickening throughout the majority of both lungs. Seven by 6 mm nodule in the posterolateral aspect of the left lower lobe on image number 114 series 5, confirmed on the sagittal and coronal reconstruction images. This is unchanged since 08/10/2016. No new nodules are seen. Musculoskeletal: Thoracic and lower cervical spine degenerative changes. CT ABDOMEN PELVIS FINDINGS Hepatobiliary: No focal liver abnormality is seen. No gallstones, gallbladder wall thickening, or biliary dilatation. Pancreas: Diffusely atrophied. Spleen: Normal in size without focal abnormality. Adrenals/Urinary Tract: Several left renal calculi measuring up to 11 mm in maximum diameter each. Tiny mid upper right renal calculus on coronal image number 68 series 6. Mild dilatation of the right renal collecting system and extrarenal pelvis with progression. Mildly dilated right ureter to the level of the ureterovesical junction with no obstructing calculus seen. Interval mild dilatation of the left renal collecting system and ureter to the level of the ureterovesical junction with no obstructing calculus seen. Mildly distended urinary bladder. Stomach/Bowel: Moderately large hiatal hernia. Multiple colonic diverticula without evidence of diverticulitis. Normal appearing appendix and small bowel. Vascular/Lymphatic: Atheromatous arterial calcifications  without aneurysm. No enlarged lymph nodes. Reproductive: Status post hysterectomy. No adnexal masses. Other: Left lateral subcutaneous edema and skin thickening. Tiny umbilical hernia containing fat. Musculoskeletal: Stable lumbar spine degenerative changes and scoliosis, including facet degenerative changes with associated grade 1 anterolisthesis at the L4-5 level, without significant change. No fractures or pars defects are seen. IMPRESSION: 1. Left lateral subcutaneous edema and skin thickening compatible with bruising. 2. Small bilateral pleural effusions. 3. Changes of congestive heart failure with interstitial pulmonary edema. 4. Stable 6 mm left lower lobe nodule. The long-term stability is compatible with a benign process. 5. Bilateral nonobstructing renal calculi. 6. Interval mild bilateral hydronephrosis and hydroureter to the level of the ureterovesical junction, without obstructing calculus seen. This is most likely physiological due to bladder distention. 7. Moderately large hiatal hernia. 8. Colonic diverticulosis. Aortic Atherosclerosis (ICD10-I70.0). Electronically Signed   By: Claudie Revering M.D.   On: 12/05/2018 20:38   Dg Chest Portable 1 View  Result Date: 12/05/2018 CLINICAL DATA:  Chest pain. EXAM: PORTABLE CHEST 1 VIEW COMPARISON:  Radiographs of September 19, 2016. FINDINGS: Stable cardiomediastinal silhouette. Sternotomy wires are noted. No pneumothorax or pleural effusion is noted. Right lung is clear. Mild left perihilar interstitial opacities are noted concerning for subsegmental atelectasis or possibly infiltrate. Bony thorax is unremarkable. IMPRESSION: Mild left perihilar interstitial opacities are noted concerning for subsegmental atelectasis or possibly pneumonia. Electronically Signed  By: Marijo Conception, M.D.   On: 12/05/2018 09:02     LOS: 3 days   Oren Binet, MD  [facility]  If 7PM-7AM, please contact night-coverage  Please page via  [email or web address] [email or web address]-click on MD name and type text message  12/08/2018, 12:42 PM

## 2018-12-08 NOTE — Anesthesia Procedure Notes (Signed)
Procedure Name: MAC Date/Time: 12/08/2018 11:10 AM Performed by: White, Amedeo Plenty, CRNA Pre-anesthesia Checklist: Patient identified, Emergency Drugs available, Suction available and Patient being monitored Patient Re-evaluated:Patient Re-evaluated prior to induction Oxygen Delivery Method: Nasal cannula

## 2018-12-08 NOTE — Progress Notes (Addendum)
Progress Note  Patient Name: [name] Date of Encounter: 12/08/2018  Primary Cardiologist: Peter Martinique, MD   Subjective   NO CP or SOB  Inpatient Medications    Scheduled Meds: . carbidopa-levodopa  2 tablet Oral TID  . chlorhexidine  15 mL Mouth Rinse BID  . HYDROcodone-acetaminophen  2 tablet Oral Once  . mouth rinse  15 mL Mouth Rinse q12n4p  . [START ON 12/09/2018] pantoprazole  40 mg Oral QAC breakfast  . warfarin  7.5 mg Oral Once  . Warfarin - Pharmacist Dosing Inpatient   Does not apply q1800   Continuous Infusions:  PRN Meds: acetaminophen, alum & mag hydroxide-simeth, ondansetron (ZOFRAN) IV   Vital Signs    Vitals:   12/08/18 1150 12/08/18 1155 12/08/18 1200 12/08/18 1205  BP: (!) 95/43  (!) 114/37   Pulse: 91 79 89 86  Resp: (!) 25 (!) 21 20 (!) 22  Temp:      TempSrc:      SpO2: 93% 93% 93% 93%  Weight:      Height:        Intake/Output Summary (Last 24 hours) at 12/08/2018 1408 Last data filed at 12/08/2018 1133 Gross per 24 hour  Intake 830 ml  Output -  Net 830 ml   Filed Weights   12/05/18 0719 12/08/18 1034  Weight: 62.1 kg 62.1 kg    Telemetry    AFib/flutter generally rate controlled, intermittent SR, occ RVR with AF - Personally Reviewed  ECG    No new EKGs - Personally Reviewed  Physical Exam   GEN: No acute distress.   Neck: No JVD Cardiac: irreg-irreg, valve is appreciated, rubs, or gallops.  Respiratory: CTA b/l. GI: Soft, nontender, non-distended  MS: No edema; No deformity. Neuro:  Nonfocal  Psych: Normal affect   Labs    Chemistry Recent Labs  Lab 12/06/18 0739 12/07/18 0435 12/08/18 0349  NA 136 139 137  K 2.9* 3.4* 3.8  CL 97* 99 97*  CO2 26 27 29   GLUCOSE 98 109* 116*  BUN 29* 34* 27*  CREATININE 1.52* 1.80* 1.33*  CALCIUM 8.2* 7.9* 8.1*  GFRNONAA 32* 26* 38*  GFRAA 38* 31* 44*  ANIONGAP 13 13 11      Hematology Recent Labs  Lab 12/06/18 0739 12/07/18 0435 12/08/18 0349    WBC 6.3 6.3 7.1  RBC 2.93* 3.04* 2.89*  HGB 8.9* 9.3* 8.8*  HCT 28.3* 29.7* 28.7*  MCV 96.6 97.7 99.3  MCH 30.4 30.6 30.4  MCHC 31.4 31.3 30.7  RDW 18.7* 18.0* 17.5*  PLT 220 218 224    Cardiac Enzymes Recent Labs  Lab 12/05/18 1854  TROPONINI 0.07*    Recent Labs  Lab 12/05/18 0732  TROPIPOC 0.05     BNPNo results for input(s): BNP, PROBNP in the last 168 hours.   DDimer  Recent Labs  Lab 12/05/18 0715  DDIMER 3.90*     Radiology    No results found.  Cardiac Studies   11/17/18: TTE Study Conclusions - Left ventricle: The cavity size was normal. Wall thickness was normal. Systolic function was mildly reduced. The estimated ejection fraction was in the range of 45% to 50%. Features are consistent with a pseudonormal left ventricular filling pattern, with concomitant abnormal relaxation and increased filling pressure (grade 2 diastolic dysfunction). - Aortic valve: A mechanical prosthesis was present. There was mild regurgitation. Valve area (VTI): 1.43 cm^2. Valve area (Vmax): 1.28 cm^2. Valve area (Vmean): 1.2 cm^2. - Mitral valve:  Mildly to moderately calcified annulus. Valve area by continuity equation (using LVOT flow): 1.55 cm^2. - Left atrium: The atrium was mildly dilated.   09/21/16 stress myoview IMPRESSION: 1. No evidence reversible ischemia. Fixed defect in the septal wall with differential including infarction versus artifact from LEFT bundle branch block. 2. Septal dyskinesia in patient with LEFT bundle branch block. 3. Left ventricular ejection fraction 53% 4. Non invasive risk stratification*: Low  Patient Profile     77 y.o. female with a hx of VHD/AS w/mechanical AVR (2007), HTN, HLD, CKD, h/o R breast cancer treated with surgery/chemo/XRT, LBBB, and a Parkinson-like syndrome with unsteady gait with falls, and AFib admitted to [facility] with c/o chest pressure, AFib w/RVR, noted with profound anemia (post  fall/trauma).  Cardiology team followed She developed some degree of volume OL/respiratory insufficiency with blood/fluids, and required diuresis, though HR did improve with times of SR,  Mild Trop elevation felt demand with anemia and RVR.  Planned for EGD (w/ heme + stool and melena reported) when clinically able (planned for today).  EP is asked to revisit case with intermittent rapid rates with AFib/flutter and h/o bradycardia  Assessment & Plan    1. AFlutter with RVR     CHA2DS2Vasc is 4, on warfarin     RVR initially felt 2/2 pain (L shoulder post fall/hematoma) and acute blood loss     She is having some episodes of fast rates into 130-140 range, though generally pretty well rate controlled on no drug     In 2017 she had sinus rates in the 40's her metoprolol succ 25mg  stopped     I am not convinced she needs better rate control at this time.  She has had her sinus and AF rates 80's since the 11th with infrequent and self limited fast rates   Dr. Lovena Le will see later today, for now, no changes   2. Mechanical AVR     warfarin held on admission 2/2 anemia, yesterday became 2.29, today 1.8     Resume ASAP when cleared,  recommend consideration for heparin or lovenox >> therapeutic INR though deferred to primary team      GI note cleared for coumadin today      3. Marked anemia 4. Recent fall (about 10 days ago)     Extensive ecchymosis L thorax     Large area of hematoma L chest     No internal bleeding by CT     S/p PRBC, H/H stable high 8's-9 range     EGD today     For questions or updates, please contact [facility] Please consult [email or web address] for contact info under        Signed, Baldwin Jamaica, PA-C  12/08/2018, 2:08 PM    EP Attending  Patient seen and examined. Agree with above. The patient is slowly improving after blood and medical therapy. She is still having atrial fib and flutter with a RVR despite transfusion. Her ecchymosis remains. In light  of her h/o bradycardia, and RVR, I would suggest re-starting low dose amiodarone, with likelihood that she will require PPM in the next few weeks/months. ] I will start amio 200 mg daily.   Mikle Bosworth.D.

## 2018-12-08 NOTE — Anesthesia Postprocedure Evaluation (Signed)
Anesthesia Post Note  Patient: Jennfer Gassen  Procedure(s) Performed: ESOPHAGOGASTRODUODENOSCOPY (EGD) (N/A )     Patient location during evaluation: PACU Anesthesia Type: MAC Level of consciousness: awake and alert Pain management: pain level controlled Vital Signs Assessment: post-procedure vital signs reviewed and stable Respiratory status: spontaneous breathing, nonlabored ventilation and respiratory function stable Cardiovascular status: blood pressure returned to baseline and stable Postop Assessment: no apparent nausea or vomiting Anesthetic complications: no    Last Vitals:  Vitals:   12/08/18 1205 12/08/18 1400  BP:  (!) 103/50  Pulse: 86 95  Resp: (!) 22 (!) 22  Temp:  36.6 C  SpO2: 93% 96%    Last Pain:  Vitals:   12/08/18 1400  TempSrc: Oral  PainSc:                  Lidia Collum

## 2018-12-08 NOTE — Interval H&P Note (Signed)
History and Physical Interval Note:  12/08/2018 11:07 AM  [name]  has presented today for surgery, with the diagnosis of acute on chronic anemia, dark, heme positive stool  The various methods of treatment have been discussed with the patient and family. After consideration of risks, benefits and other options for treatment, the patient has consented to  Procedure(s): ESOPHAGOGASTRODUODENOSCOPY (EGD) (N/A) as a surgical intervention .  The patient's history has been reviewed, patient examined, no change in status, stable for surgery.  I have reviewed the patient's chart and labs.  Questions were answered to the patient's satisfaction.    Hgb > 8, INR < 2.0 EGD today  Nelida Meuse III

## 2018-12-09 ENCOUNTER — Encounter (HOSPITAL_COMMUNITY): Payer: Self-pay | Admitting: Gastroenterology

## 2018-12-09 DIAGNOSIS — J9621 Acute and chronic respiratory failure with hypoxia: Secondary | ICD-10-CM

## 2018-12-09 LAB — BPAM RBC
Blood Product Expiration Date: 202001082359
Blood Product Expiration Date: 202001082359
Blood Product Expiration Date: 202001082359
Blood Product Expiration Date: 202001082359
ISSUE DATE / TIME: 201912101016
ISSUE DATE / TIME: 201912101331
Unit Type and Rh: 5100
Unit Type and Rh: 5100
Unit Type and Rh: 5100
Unit Type and Rh: 5100

## 2018-12-09 LAB — TYPE AND SCREEN
ABO/RH(D): O POS
Antibody Screen: NEGATIVE
Unit division: 0
Unit division: 0
Unit division: 0
Unit division: 0

## 2018-12-09 LAB — CBC
HCT: 28.4 % — ABNORMAL LOW (ref 36.0–46.0)
Hemoglobin: 8.9 g/dL — ABNORMAL LOW (ref 12.0–15.0)
MCH: 31.2 pg (ref 26.0–34.0)
MCHC: 31.3 g/dL (ref 30.0–36.0)
MCV: 99.6 fL (ref 80.0–100.0)
Platelets: 205 10*3/uL (ref 150–400)
RBC: 2.85 MIL/uL — ABNORMAL LOW (ref 3.87–5.11)
RDW: 17.1 % — ABNORMAL HIGH (ref 11.5–15.5)
WBC: 5.9 10*3/uL (ref 4.0–10.5)
nRBC: 0 % (ref 0.0–0.2)

## 2018-12-09 LAB — PROTIME-INR
INR: 1.44
Prothrombin Time: 17.4 seconds — ABNORMAL HIGH (ref 11.4–15.2)

## 2018-12-09 MED ORDER — AMIODARONE HCL 200 MG PO TABS: 200.0000 mg | ORAL_TABLET | Freq: Every day | ORAL | 0 refills | Status: DC

## 2018-12-09 NOTE — Progress Notes (Signed)
Progress Note  Patient Name: [name] Date of Encounter: 12/09/2018  Primary Cardiologist: Christy Martinique, MD   Subjective   No CP or SOB  Inpatient Medications    Scheduled Meds: . amiodarone  200 mg Oral Daily  . carbidopa-levodopa  2 tablet Oral TID  . chlorhexidine  15 mL Mouth Rinse BID  . HYDROcodone-acetaminophen  2 tablet Oral Once  . mouth rinse  15 mL Mouth Rinse q12n4p  . pantoprazole  40 mg Oral QAC breakfast  . Warfarin - Pharmacist Dosing Inpatient   Does not apply q1800   Continuous Infusions:  PRN Meds: acetaminophen, alum & mag hydroxide-simeth, ondansetron (ZOFRAN) IV   Vital Signs    Vitals:   12/08/18 1324 12/08/18 1400 12/08/18 2146 12/09/18 0410  BP: 125/63 (!) 103/50 110/60 118/70  Pulse: 76 95 81 90  Resp: 20 (!) 22 18 18   Temp: 98.2 F (36.8 C) 97.9 F (36.6 C) 100.1 F (37.8 C) 98.7 F (37.1 C)  TempSrc: Oral Oral Oral   SpO2: 100% 96% 96% 100%  Weight:      Height:        Intake/Output Summary (Last 24 hours) at 12/09/2018 1010 Last data filed at 12/09/2018 0935 Gross per 24 hour  Intake 680 ml  Output -  Net 680 ml   Filed Weights   12/05/18 0719 12/08/18 1034  Weight: 62.1 kg 62.1 kg    Telemetry    AF - Personally Reviewed  ECG    None new - Personally Reviewed  Physical Exam   GEN: Well nourished, well developed, in no acute distress  HEENT: normal  Neck: no JVD, carotid bruits, or masses Cardiac: iRRR; no murmurs, rubs, or gallops,no edema  Respiratory:  clear to auscultation bilaterally, normal work of breathing GI: soft, nontender, nondistended, + BS MS: no deformity or atrophy  Skin: warm and dry Neuro:  Strength and sensation are intact Psych: euthymic mood, full affect   Labs    Chemistry Recent Labs  Lab 12/06/18 0739 12/07/18 0435 12/08/18 0349  NA 136 139 137  K 2.9* 3.4* 3.8  CL 97* 99 97*  CO2 26 27 29   GLUCOSE 98 109* 116*  BUN 29* 34* 27*  CREATININE 1.52* 1.80*  1.33*  CALCIUM 8.2* 7.9* 8.1*  GFRNONAA 32* 26* 38*  GFRAA 38* 31* 44*  ANIONGAP 13 13 11      Hematology Recent Labs  Lab 12/07/18 0435 12/08/18 0349 12/09/18 0217  WBC 6.3 7.1 5.9  RBC 3.04* 2.89* 2.85*  HGB 9.3* 8.8* 8.9*  HCT 29.7* 28.7* 28.4*  MCV 97.7 99.3 99.6  MCH 30.6 30.4 31.2  MCHC 31.3 30.7 31.3  RDW 18.0* 17.5* 17.1*  PLT 218 224 205    Cardiac Enzymes Recent Labs  Lab 12/05/18 1854  TROPONINI 0.07*    Recent Labs  Lab 12/05/18 0732  TROPIPOC 0.05     BNPNo results for input(s): BNP, PROBNP in the last 168 hours.   DDimer  Recent Labs  Lab 12/05/18 0715  DDIMER 3.90*     Radiology    No results found.  Cardiac Studies   11/17/18: TTE Study Conclusions - Left ventricle: The cavity size was normal. Wall thickness was normal. Systolic function was mildly reduced. The estimated ejection fraction was in the range of 45% to 50%. Features are consistent with a pseudonormal left ventricular filling pattern, with concomitant abnormal relaxation and increased filling pressure (grade 2 diastolic dysfunction). - Aortic valve: A mechanical prosthesis  was present. There was mild regurgitation. Valve area (VTI): 1.43 cm^2. Valve area (Vmax): 1.28 cm^2. Valve area (Vmean): 1.2 cm^2. - Mitral valve: Mildly to moderately calcified annulus. Valve area by continuity equation (using LVOT flow): 1.55 cm^2. - Left atrium: The atrium was mildly dilated.   09/21/16 stress myoview IMPRESSION: 1. No evidence reversible ischemia. Fixed defect in the septal wall with differential including infarction versus artifact from LEFT bundle branch block. 2. Septal dyskinesia in patient with LEFT bundle branch block. 3. Left ventricular ejection fraction 53% 4. Non invasive risk stratification*: Low  Patient Profile     77 y.o. 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  Cardiology team followed She developed some degree of volume OL/respiratory insufficiency with blood/fluids, and required diuresis, though HR did improve with times of SR,  Mild Trop elevation felt demand with anemia and RVR.  Planned for EGD (w/ heme + stool and melena reported) when clinically able (planned for today).  EP is asked to revisit case with intermittent rapid rates with AFib/flutter and h/o bradycardia  Assessment & Plan    1. AFlutter with RVR     On warfarin. RVR likely secondary to pain. Rate control improved on 200 mg amiodarone. Continue at 200 mg daily. [name] have her follow up in clinic.  This patients CHA2DS2-VASc Score and unadjusted Ischemic Stroke Rate (% per year) is equal to 4.8 % stroke rate/year from a score of 4  Above score calculated as 1 point each if present [CHF, HTN, DM, Vascular=MI/PAD/Aortic Plaque, Age if 65-74, or Female] Above score calculated as 2 points each if present [Age > 75, or Stroke/TIA/TE]  2. Mechanical AVR     Warfarin held due to anemia. Has since been resumed.       3. Marked anemia      For questions or updates, please contact [place] Please consult [email or web address] for contact info under        Signed, Christy Conkright Meredith Leeds, MD  12/09/2018, 10:10 AM    EP Attending  Patient seen and examined. Agree with above. The patient is slowly improving after blood and medical therapy. She is still having atrial fib and flutter with a RVR despite transfusion. Her ecchymosis remains. In light of her h/o bradycardia, and RVR, I would suggest re-starting low dose amiodarone, with likelihood that she [name] require PPM in the next few weeks/months. ] I [name] start amio 200 mg daily.   [name].D.

## 2018-12-09 NOTE — Progress Notes (Signed)
Nsg Discharge Note  Admit Date:  12/05/2018 Discharge date: 12/09/2018   [name] to be D/C'd Home with [facility] per MD order.  AVS teaching completed. Patient/caregiver able to verbalize understanding.   Skin clean, dry and intact without evidence of skin break down, no evidence of skin tears noted. IV catheter discontinued intact. Site without signs and symptoms of complications - no redness or edema noted at insertion site, patient denies c/o pain - only slight tenderness at site.  Dressing with slight pressure applied.  D/c Instructions-Education: Discharge instructions given to patient/family with verbalized understanding. D/c education completed with patient/family including follow up instructions, medication list, d/c activities limitations if indicated, with other d/c instructions as indicated by MD - patient able to verbalize understanding, all questions fully answered. Patient instructed to return to ED, call 911, or call MD for any changes in condition.  Patient escorted via [place], and D/C home via private auto.  Hiram Comber, RN 12/09/2018 10:19 AM

## 2018-12-09 NOTE — Care Management Note (Signed)
Case Management Note  Patient Details  Name: [name] MRN: [id number] Date of Birth: 1941/06/04  Subjective/Objective:                    Action/Plan:  Discussed HH needs with patient and spouse, and [facility] providers that are accepting patients with [employer] insurance at this time. They would like to use [facility], aware that OT cannot be scheduled. Referral accepted by [facility].  No other CM needs.   Expected Discharge Date:  12/09/18               Expected Discharge Plan:  [place]  In-House Referral:     Discharge planning Services  CM Consult  Post Acute Care Choice:  Home Health Choice offered to:  Spouse  DME Arranged:    DME Agency:     HH Arranged:  RN, PT, Nurse's Aide [place] Agency:  [place]  Status of Service:  Completed, signed off  If discussed at [place] of Stay Meetings, dates discussed:    Additional Comments:  Carles Collet, RN 12/09/2018, 10:36 AM

## 2018-12-09 NOTE — Discharge Summary (Signed)
PATIENT DETAILS Name: [name] Age: 77 y.o. Sex: female Date of Birth: 12/29/1940 MRN: [id number]. Admitting Physician: Karmen Bongo, MD [id number], Quillian Quince, MD  Admit Date: 12/05/2018 Discharge date: 12/09/2018  Recommendations for Outpatient Follow-up:  1. Follow up with PCP in 1-2 weeks 2. Please obtain BMP/CBC in one week 3. Please ensure follow-up with EP and general cardiology. 4. Diuretics discontinued on admission his blood pressure stable during this hospital stay  Admitted From:  Home  Disposition: Home with home health services   Home Health: Yes  Equipment/Devices: None  Discharge Condition: Stable  CODE STATUS: FULL CODE  Diet recommendation:  Heart Healthy  Brief Summary: See H&P, Labs, Consult and Test reports for all details in brief, Patient is a 77 y.o. female with history of A. fib on anticoagulation, chronic systolic heart failure, aortic valve Parkinson's disease-with recent history of mechanical fall complicated by significant bruising of her left torso area-presented to the hospital for evaluation of chest pain, she was found to have a hemoglobin of 5.6.  There was some concern for recent GI bleeding-she was transfused 2 units of PRBC and admitted to the hospitalist service.  Hospital course further complicated by development of acute hypoxic respiratory failure in the setting of pulmonary edema requiring BiPAP.  See below for further details  Brief Hospital Course: Acute hypoxic respiratory failure secondary to pulmonary edema/acute on chronic systolic heart failure: Respiratory failure has resolved-this is likely secondary to pulmonary edema in the setting of blood transfusion in a patient with chronic systolic heart failure.   Anemia: Felt to be multifactorial-appears to have chronic normocytic anemia at baseline-worsening anemia felt to be secondary to extensive subcutaneous bruising/ecchymosis after a fall, and probably  recent GI bleeding.  Has been transfused 2 units of PRBC on 12/10-hemoglobin remains stable.  Underwent EGD on 12/13-no bleeding foci seen-okay to resume Coumadin per GI.  Continue to follow CBC periodically in the outpatient setting.  Upper GI bleeding: Concern for upper GI bleeding-does give history of some melanotic stools over the past few days-however none since admission-GI following-underwent EGD on 12/13 without any major foci of bleeding.  Continue PPI-Per GI okay to resume Coumadin-Coumadin was resumed on 12/13.   A. fib with RVR:  Rate better controlled-seen by cardiology and subsequently by EP-started on amiodarone.  Coumadin resumed on 12/13.    Mechanical aortic valve replacement: Coumadin was held on admission due to severity of anemia-and in anticipation of EGD.  Once EGD did not show any major bleeding foci-Coumadin was resumed on 12/13.  INR remains subtherapeutic-discussed with cardiology this morning-Dr. Camnitz-okay to discharge home today-patient instructed to continue her usual regimen of Coumadin and follow with the Coumadin clinic on Monday.  Elevated troponin: Trend is flat and not consistent with ACS-cardiology does not recommend any further ischemic evaluation at this time.  Hypertension: Blood pressure stable throughout this hospital stay-at this time she does not require diuretics-have held HCTZ/triamterene.  AKI on CKD stage III: AKI hemodynamically mediated-improving with supportive care.    Have stopped Lasix which she briefly required when she went into respiratory failure.  Parkinson's disease: Stable-continue Sinemet.She may have autonomic dysfunction associated with Parkinson's disease contributing to her recent falls.  Recent mechanical fall: Suspect she may have autonomic dysfunction associated with Parkinson's disease-PT evaluation completed-recommendations are for home health services.  Dyslipidemia: Resume statin over the next few days.  Chronic  debility/deconditioning: Secondary to Parkinson's disease-may have worsened due to acute illness-PT evaluation completed-recommendations are for home health  services  Procedures/Studies: 12/13>> EGD  Discharge Diagnoses:  Principal Problem:   Symptomatic anemia Active Problems:   S/P AVR (aortic valve replacement)   HTN (hypertension)   Hypercholesteremia   Atrial fibrillation with RVR (HCC)   Chronic anticoagulation   GI bleed   CKD (chronic kidney disease), stage III (HCC)   Elevated d-dimer   SOB (shortness of breath)   Supratherapeutic INR   Parkinsonism (HCC)   Atrial flutter (HCC)   Heme positive stool   Discharge Instructions:  Activity:  As tolerated with Full fall precautions use walker/cane & assistance as needed   Discharge Instructions    Diet - low sodium heart healthy   Complete by:  As directed    Discharge instructions   Complete by:  As directed    Follow with Primary MD  Leanna Battles, MD in 1 week  Follow at your coumadin clinic on Monday for a INR check.  Follow with your primary cardiologist in 1-2 weeks  Please get a complete blood count and chemistry panel checked by your Primary MD at your next visit, and again as instructed by your Primary MD.  Get Medicines reviewed and adjusted: Please take all your medications with you for your next visit with your Primary MD  Laboratory/radiological data: Please request your Primary MD to go over all hospital tests and procedure/radiological results at the follow up, please ask your Primary MD to get all Hospital records sent to his/her office.  In some cases, they will be blood work, cultures and biopsy results pending at the time of your discharge. Please request that your primary care M.D. follows up on these results.  Also Note the following: If you experience worsening of your admission symptoms, develop shortness of breath, life threatening emergency, suicidal or homicidal thoughts you must  seek medical attention immediately by calling 911 or calling your MD immediately  if symptoms less severe.  You must read complete instructions/literature along with all the possible adverse reactions/side effects for all the Medicines you take and that have been prescribed to you. Take any new Medicines after you have completely understood and accpet all the possible adverse reactions/side effects.   Do not drive when taking Pain medications or sleeping medications (Benzodaizepines)  Do not take more than prescribed Pain, Sleep and Anxiety Medications. It is not advisable to combine anxiety,sleep and pain medications without talking with your primary care practitioner  Special Instructions: If you have smoked or chewed Tobacco  in the last 2 yrs please stop smoking, stop any regular Alcohol  and or any Recreational drug use.  Wear Seat belts while driving.  Please note: You were cared for by a hospitalist during your hospital stay. Once you are discharged, your primary care physician will handle any further medical issues. Please note that NO REFILLS for any discharge medications will be authorized once you are discharged, as it is imperative that you return to your primary care physician (or establish a relationship with a primary care physician if you do not have one) for your post hospital discharge needs so that they can reassess your need for medications and monitor your lab values.   Increase activity slowly   Complete by:  As directed      Allergies as of 12/09/2018      Reactions   Demerol [meperidine] Shortness Of Breath, Swelling   Oxycodone Other (See Comments)   Hallucinations    Adhesive [tape] Other (See Comments)   Redness and skin tears  Ivp Dye [iodinated Diagnostic Agents] Hives   OK with benadryl pre-med (50mg  one hour before receiving iodinated contrast agent)   Phenergan [promethazine] Other (See Comments)   Avoids due to reaction with current medications        Medication List    STOP taking these medications   MAXZIDE-25 37.5-25 MG tablet Generic drug:  triamterene-hydrochlorothiazide     TAKE these medications   acetaminophen 500 MG tablet Commonly known as:  TYLENOL Take 1,000 mg by mouth every 6 (six) hours as needed (head pain).   amiodarone 200 MG tablet Commonly known as:  PACERONE Take 1 tablet (200 mg total) by mouth daily. Start taking on:  December 10, 2018   calcium carbonate 500 MG chewable tablet Commonly known as:  TUMS - dosed in mg elemental calcium Chew 1 tablet by mouth daily.   calcium citrate-vitamin D 315-200 MG-UNIT tablet Commonly known as:  CITRACAL+D Take 1 tablet by mouth 3 (three) times daily.   carbidopa-levodopa 25-100 MG tablet Commonly known as:  SINEMET IR TAKE 2 TABLETS BY MOUTH THREE TIMES DAILY   famotidine-calcium carbonate-magnesium hydroxide 10-800-165 MG chewable tablet Commonly known as:  PEPCID COMPLETE Chew 1 tablet by mouth daily as needed (heartburn).   KLOR-CON M20 20 MEQ tablet Generic drug:  potassium chloride SA Take 20 mEq by mouth daily.   KRILL OIL PO Take 1 capsule by mouth daily.   Melatonin 3 MG Tabs Take 3 mg by mouth at bedtime as needed (sleep).   multivitamin tablet Take 1 tablet by mouth daily at 12 noon.   pantoprazole 40 MG tablet Commonly known as:  PROTONIX Take 40 mg by mouth daily.   PROBIOTIC-10 PO Take 1 capsule by mouth daily.   simvastatin 20 MG tablet Commonly known as:  ZOCOR Take 20 mg by mouth every evening.   VISION-VITE PRESERVE PO Take 1 tablet by mouth 2 (two) times daily.   vitamin B-12 1000 MCG tablet Commonly known as:  CYANOCOBALAMIN Take 1,000 mcg by mouth every morning.   Vitamin D3 50 MCG (2000 UT) Tabs Take 1 capsule by mouth daily.   warfarin 5 MG tablet Commonly known as:  COUMADIN Take 5 mg by mouth. Take 7.5 mg by mouth every Monday, Wednesday, Friday. Take 5 mg by mouth all other days.      Follow-up  Information    Baldwin Jamaica, PA-C Follow up.   Specialty:  Cardiology Why:  01/01/19 @ 8:00AM Contact information: [address] [place] [address] [phone]        Evans Lance, MD Follow up.   Specialty:  Cardiology Why:  02/27/19 @ 10:45AM Contact information: [address] [place] [address] [phone]        Leanna Battles, MD. Schedule an appointment as soon as possible for a visit in 1 week(s).   Specialty:  Internal Medicine Contact information: [place] [address] [phone]        Martinique, Peter M, MD .   Specialty:  Cardiology Contact information: [address] [place] [address] [phone]          Allergies  Allergen Reactions  . Demerol [Meperidine] Shortness Of Breath and Swelling  . Oxycodone Other (See Comments)    Hallucinations   . Adhesive [Tape] Other (See Comments)    Redness and skin tears  . Ivp Dye [Iodinated Diagnostic Agents] Hives    OK with benadryl pre-med (50mg  one  hour before receiving iodinated contrast agent)  . Phenergan [Promethazine] Other (See Comments)    Avoids due to reaction with current medications    Consultations:   cardiology and GI  Other Procedures/Studies: Ct Abdomen Pelvis Wo Contrast  Result Date: 12/05/2018 CLINICAL DATA:  Chest pain and chronic shortness of breath. Minor blunt abdominal trauma. EXAM: CT CHEST, ABDOMEN AND PELVIS WITHOUT CONTRAST TECHNIQUE: Multidetector CT imaging of the chest, abdomen and pelvis was performed following the standard protocol without IV contrast. COMPARISON:  Portable chest obtained earlier today. Chest and abdomen radiographs dated 09/19/2016. Abdomen and pelvis CT dated 08/10/2016. FINDINGS: CT CHEST FINDINGS Cardiovascular: Atheromatous calcifications, including the coronary arteries and aorta. Post CABG changes. Borderline enlarged heart. Dense mitral valve annulus calcifications.  Mediastinum/Nodes: No enlarged mediastinal, hilar, or axillary lymph nodes. Thyroid gland, trachea, and esophagus demonstrate no significant findings. A moderately large hiatal hernia is noted. Lungs/Pleura: Small bilateral pleural effusions. Interstitial thickening throughout the majority of both lungs. Seven by 6 mm nodule in the posterolateral aspect of the left lower lobe on image number 114 series 5, confirmed on the sagittal and coronal reconstruction images. This is unchanged since 08/10/2016. No new nodules are seen. Musculoskeletal: Thoracic and lower cervical spine degenerative changes. CT ABDOMEN PELVIS FINDINGS Hepatobiliary: No focal liver abnormality is seen. No gallstones, gallbladder wall thickening, or biliary dilatation. Pancreas: Diffusely atrophied. Spleen: Normal in size without focal abnormality. Adrenals/Urinary Tract: Several left renal calculi measuring up to 11 mm in maximum diameter each. Tiny mid upper right renal calculus on coronal image number 68 series 6. Mild dilatation of the right renal collecting system and extrarenal pelvis with progression. Mildly dilated right ureter to the level of the ureterovesical junction with no obstructing calculus seen. Interval mild dilatation of the left renal collecting system and ureter to the level of the ureterovesical junction with no obstructing calculus seen. Mildly distended urinary bladder. Stomach/Bowel: Moderately large hiatal hernia. Multiple colonic diverticula without evidence of diverticulitis. Normal appearing appendix and small bowel. Vascular/Lymphatic: Atheromatous arterial calcifications without aneurysm. No enlarged lymph nodes. Reproductive: Status post hysterectomy. No adnexal masses. Other: Left lateral subcutaneous edema and skin thickening. Tiny umbilical hernia containing fat. Musculoskeletal: Stable lumbar spine degenerative changes and scoliosis, including facet degenerative changes with associated grade 1 anterolisthesis  at the L4-5 level, without significant change. No fractures or pars defects are seen. IMPRESSION: 1. Left lateral subcutaneous edema and skin thickening compatible with bruising. 2. Small bilateral pleural effusions. 3. Changes of congestive heart failure with interstitial pulmonary edema. 4. Stable 6 mm left lower lobe nodule. The long-term stability is compatible with a benign process. 5. Bilateral nonobstructing renal calculi. 6. Interval mild bilateral hydronephrosis and hydroureter to the level of the ureterovesical junction, without obstructing calculus seen. This is most likely physiological due to bladder distention. 7. Moderately large hiatal hernia. 8. Colonic diverticulosis. Aortic Atherosclerosis (ICD10-I70.0). Electronically Signed   By: Claudie Revering M.D.   On: 12/05/2018 20:38   Ct Chest Wo Contrast  Result Date: 12/05/2018 CLINICAL DATA:  Chest pain and chronic shortness of breath. Minor blunt abdominal trauma. EXAM: CT CHEST, ABDOMEN AND PELVIS WITHOUT CONTRAST TECHNIQUE: Multidetector CT imaging of the chest, abdomen and pelvis was performed following the standard protocol without IV contrast. COMPARISON:  Portable chest obtained earlier today. Chest and abdomen radiographs dated 09/19/2016. Abdomen and pelvis CT dated 08/10/2016. FINDINGS: CT CHEST FINDINGS Cardiovascular: Atheromatous calcifications, including the coronary arteries and aorta. Post CABG changes. Borderline enlarged heart. Dense mitral valve annulus  calcifications. Mediastinum/Nodes: No enlarged mediastinal, hilar, or axillary lymph nodes. Thyroid gland, trachea, and esophagus demonstrate no significant findings. A moderately large hiatal hernia is noted. Lungs/Pleura: Small bilateral pleural effusions. Interstitial thickening throughout the majority of both lungs. Seven by 6 mm nodule in the posterolateral aspect of the left lower lobe on image number 114 series 5, confirmed on the sagittal and coronal reconstruction images.  This is unchanged since 08/10/2016. No new nodules are seen. Musculoskeletal: Thoracic and lower cervical spine degenerative changes. CT ABDOMEN PELVIS FINDINGS Hepatobiliary: No focal liver abnormality is seen. No gallstones, gallbladder wall thickening, or biliary dilatation. Pancreas: Diffusely atrophied. Spleen: Normal in size without focal abnormality. Adrenals/Urinary Tract: Several left renal calculi measuring up to 11 mm in maximum diameter each. Tiny mid upper right renal calculus on coronal image number 68 series 6. Mild dilatation of the right renal collecting system and extrarenal pelvis with progression. Mildly dilated right ureter to the level of the ureterovesical junction with no obstructing calculus seen. Interval mild dilatation of the left renal collecting system and ureter to the level of the ureterovesical junction with no obstructing calculus seen. Mildly distended urinary bladder. Stomach/Bowel: Moderately large hiatal hernia. Multiple colonic diverticula without evidence of diverticulitis. Normal appearing appendix and small bowel. Vascular/Lymphatic: Atheromatous arterial calcifications without aneurysm. No enlarged lymph nodes. Reproductive: Status post hysterectomy. No adnexal masses. Other: Left lateral subcutaneous edema and skin thickening. Tiny umbilical hernia containing fat. Musculoskeletal: Stable lumbar spine degenerative changes and scoliosis, including facet degenerative changes with associated grade 1 anterolisthesis at the L4-5 level, without significant change. No fractures or pars defects are seen. IMPRESSION: 1. Left lateral subcutaneous edema and skin thickening compatible with bruising. 2. Small bilateral pleural effusions. 3. Changes of congestive heart failure with interstitial pulmonary edema. 4. Stable 6 mm left lower lobe nodule. The long-term stability is compatible with a benign process. 5. Bilateral nonobstructing renal calculi. 6. Interval mild bilateral  hydronephrosis and hydroureter to the level of the ureterovesical junction, without obstructing calculus seen. This is most likely physiological due to bladder distention. 7. Moderately large hiatal hernia. 8. Colonic diverticulosis. Aortic Atherosclerosis (ICD10-I70.0). Electronically Signed   By: Claudie Revering M.D.   On: 12/05/2018 20:38   Dg Chest Portable 1 View  Result Date: 12/05/2018 CLINICAL DATA:  Chest pain. EXAM: PORTABLE CHEST 1 VIEW COMPARISON:  Radiographs of September 19, 2016. FINDINGS: Stable cardiomediastinal silhouette. Sternotomy wires are noted. No pneumothorax or pleural effusion is noted. Right lung is clear. Mild left perihilar interstitial opacities are noted concerning for subsegmental atelectasis or possibly infiltrate. Bony thorax is unremarkable. IMPRESSION: Mild left perihilar interstitial opacities are noted concerning for subsegmental atelectasis or possibly pneumonia. Electronically Signed   By: Marijo Conception, M.D.   On: 12/05/2018 09:02      TODAY-DAY OF DISCHARGE:  Subjective:   Danie Chandler today has no headache,no chest abdominal pain,no new weakness tingling or numbness, feels much better wants to go home today.   Objective:   Blood pressure 118/70, pulse 90, temperature 98.7 F (37.1 C), resp. rate 18, height 4\' 11"  (1.499 m), weight 62.1 kg, SpO2 100 %.  Intake/Output Summary (Last 24 hours) at 12/09/2018 1016 Last data filed at 12/09/2018 0935 Gross per 24 hour  Intake 680 ml  Output -  Net 680 ml   Filed Weights   12/05/18 0719 12/08/18 1034  Weight: 62.1 kg 62.1 kg    Exam: Awake Alert, Oriented *3, No new F.N deficits, Normal affect Gem.AT,PERRAL Supple  Neck,No JVD, No cervical lymphadenopathy appriciated.  Symmetrical Chest wall movement, Good air movement bilaterally, CTAB RRR,No Gallops,Rubs or new Murmurs, No Parasternal Heave +ve B.Sounds, Abd Soft, Non tender, No organomegaly appriciated, No rebound -guarding or rigidity. No  Cyanosis, Clubbing or edema, No new Rash or bruise   PERTINENT RADIOLOGIC STUDIES: Ct Abdomen Pelvis Wo Contrast  Result Date: 12/05/2018 CLINICAL DATA:  Chest pain and chronic shortness of breath. Minor blunt abdominal trauma. EXAM: CT CHEST, ABDOMEN AND PELVIS WITHOUT CONTRAST TECHNIQUE: Multidetector CT imaging of the chest, abdomen and pelvis was performed following the standard protocol without IV contrast. COMPARISON:  Portable chest obtained earlier today. Chest and abdomen radiographs dated 09/19/2016. Abdomen and pelvis CT dated 08/10/2016. FINDINGS: CT CHEST FINDINGS Cardiovascular: Atheromatous calcifications, including the coronary arteries and aorta. Post CABG changes. Borderline enlarged heart. Dense mitral valve annulus calcifications. Mediastinum/Nodes: No enlarged mediastinal, hilar, or axillary lymph nodes. Thyroid gland, trachea, and esophagus demonstrate no significant findings. A moderately large hiatal hernia is noted. Lungs/Pleura: Small bilateral pleural effusions. Interstitial thickening throughout the majority of both lungs. Seven by 6 mm nodule in the posterolateral aspect of the left lower lobe on image number 114 series 5, confirmed on the sagittal and coronal reconstruction images. This is unchanged since 08/10/2016. No new nodules are seen. Musculoskeletal: Thoracic and lower cervical spine degenerative changes. CT ABDOMEN PELVIS FINDINGS Hepatobiliary: No focal liver abnormality is seen. No gallstones, gallbladder wall thickening, or biliary dilatation. Pancreas: Diffusely atrophied. Spleen: Normal in size without focal abnormality. Adrenals/Urinary Tract: Several left renal calculi measuring up to 11 mm in maximum diameter each. Tiny mid upper right renal calculus on coronal image number 68 series 6. Mild dilatation of the right renal collecting system and extrarenal pelvis with progression. Mildly dilated right ureter to the level of the ureterovesical junction with no  obstructing calculus seen. Interval mild dilatation of the left renal collecting system and ureter to the level of the ureterovesical junction with no obstructing calculus seen. Mildly distended urinary bladder. Stomach/Bowel: Moderately large hiatal hernia. Multiple colonic diverticula without evidence of diverticulitis. Normal appearing appendix and small bowel. Vascular/Lymphatic: Atheromatous arterial calcifications without aneurysm. No enlarged lymph nodes. Reproductive: Status post hysterectomy. No adnexal masses. Other: Left lateral subcutaneous edema and skin thickening. Tiny umbilical hernia containing fat. Musculoskeletal: Stable lumbar spine degenerative changes and scoliosis, including facet degenerative changes with associated grade 1 anterolisthesis at the L4-5 level, without significant change. No fractures or pars defects are seen. IMPRESSION: 1. Left lateral subcutaneous edema and skin thickening compatible with bruising. 2. Small bilateral pleural effusions. 3. Changes of congestive heart failure with interstitial pulmonary edema. 4. Stable 6 mm left lower lobe nodule. The long-term stability is compatible with a benign process. 5. Bilateral nonobstructing renal calculi. 6. Interval mild bilateral hydronephrosis and hydroureter to the level of the ureterovesical junction, without obstructing calculus seen. This is most likely physiological due to bladder distention. 7. Moderately large hiatal hernia. 8. Colonic diverticulosis. Aortic Atherosclerosis (ICD10-I70.0). Electronically Signed   By: Claudie Revering M.D.   On: 12/05/2018 20:38   Ct Chest Wo Contrast  Result Date: 12/05/2018 CLINICAL DATA:  Chest pain and chronic shortness of breath. Minor blunt abdominal trauma. EXAM: CT CHEST, ABDOMEN AND PELVIS WITHOUT CONTRAST TECHNIQUE: Multidetector CT imaging of the chest, abdomen and pelvis was performed following the standard protocol without IV contrast. COMPARISON:  Portable chest obtained  earlier today. Chest and abdomen radiographs dated 09/19/2016. Abdomen and pelvis CT dated 08/10/2016. FINDINGS: CT CHEST FINDINGS  Cardiovascular: Atheromatous calcifications, including the coronary arteries and aorta. Post CABG changes. Borderline enlarged heart. Dense mitral valve annulus calcifications. Mediastinum/Nodes: No enlarged mediastinal, hilar, or axillary lymph nodes. Thyroid gland, trachea, and esophagus demonstrate no significant findings. A moderately large hiatal hernia is noted. Lungs/Pleura: Small bilateral pleural effusions. Interstitial thickening throughout the majority of both lungs. Seven by 6 mm nodule in the posterolateral aspect of the left lower lobe on image number 114 series 5, confirmed on the sagittal and coronal reconstruction images. This is unchanged since 08/10/2016. No new nodules are seen. Musculoskeletal: Thoracic and lower cervical spine degenerative changes. CT ABDOMEN PELVIS FINDINGS Hepatobiliary: No focal liver abnormality is seen. No gallstones, gallbladder wall thickening, or biliary dilatation. Pancreas: Diffusely atrophied. Spleen: Normal in size without focal abnormality. Adrenals/Urinary Tract: Several left renal calculi measuring up to 11 mm in maximum diameter each. Tiny mid upper right renal calculus on coronal image number 68 series 6. Mild dilatation of the right renal collecting system and extrarenal pelvis with progression. Mildly dilated right ureter to the level of the ureterovesical junction with no obstructing calculus seen. Interval mild dilatation of the left renal collecting system and ureter to the level of the ureterovesical junction with no obstructing calculus seen. Mildly distended urinary bladder. Stomach/Bowel: Moderately large hiatal hernia. Multiple colonic diverticula without evidence of diverticulitis. Normal appearing appendix and small bowel. Vascular/Lymphatic: Atheromatous arterial calcifications without aneurysm. No enlarged lymph nodes.  Reproductive: Status post hysterectomy. No adnexal masses. Other: Left lateral subcutaneous edema and skin thickening. Tiny umbilical hernia containing fat. Musculoskeletal: Stable lumbar spine degenerative changes and scoliosis, including facet degenerative changes with associated grade 1 anterolisthesis at the L4-5 level, without significant change. No fractures or pars defects are seen. IMPRESSION: 1. Left lateral subcutaneous edema and skin thickening compatible with bruising. 2. Small bilateral pleural effusions. 3. Changes of congestive heart failure with interstitial pulmonary edema. 4. Stable 6 mm left lower lobe nodule. The long-term stability is compatible with a benign process. 5. Bilateral nonobstructing renal calculi. 6. Interval mild bilateral hydronephrosis and hydroureter to the level of the ureterovesical junction, without obstructing calculus seen. This is most likely physiological due to bladder distention. 7. Moderately large hiatal hernia. 8. Colonic diverticulosis. Aortic Atherosclerosis (ICD10-I70.0). Electronically Signed   By: Claudie Revering M.D.   On: 12/05/2018 20:38   Dg Chest Portable 1 View  Result Date: 12/05/2018 CLINICAL DATA:  Chest pain. EXAM: PORTABLE CHEST 1 VIEW COMPARISON:  Radiographs of September 19, 2016. FINDINGS: Stable cardiomediastinal silhouette. Sternotomy wires are noted. No pneumothorax or pleural effusion is noted. Right lung is clear. Mild left perihilar interstitial opacities are noted concerning for subsegmental atelectasis or possibly infiltrate. Bony thorax is unremarkable. IMPRESSION: Mild left perihilar interstitial opacities are noted concerning for subsegmental atelectasis or possibly pneumonia. Electronically Signed   By: Marijo Conception, M.D.   On: 12/05/2018 09:02     PERTINENT LAB RESULTS: CBC: Recent Labs    12/08/18 0349 12/09/18 0217  WBC 7.1 5.9  HGB 8.8* 8.9*  HCT 28.7* 28.4*  PLT 224 205   CMET CMP     Component Value  Date/Time   NA 137 12/08/2018 0349   K 3.8 12/08/2018 0349   CL 97 (L) 12/08/2018 0349   CO2 29 12/08/2018 0349   GLUCOSE 116 (H) 12/08/2018 0349   BUN 27 (H) 12/08/2018 0349   CREATININE 1.33 (H) 12/08/2018 0349   CALCIUM 8.1 (L) 12/08/2018 0349   PROT 6.4 (L) 09/19/2016 0550  ALBUMIN 3.6 09/19/2016 0550   AST 36 09/19/2016 0550   ALT 7 (L) 09/19/2016 0550   ALKPHOS 47 09/19/2016 0550   BILITOT 0.8 09/19/2016 0550   GFRNONAA 38 (L) 12/08/2018 0349   GFRAA 44 (L) 12/08/2018 0349    GFR Estimated Creatinine Clearance: 28 mL/min (A) (by C-G formula based on SCr of 1.33 mg/dL (H)). No results for input(s): LIPASE, AMYLASE in the last 72 hours. No results for input(s): CKTOTAL, CKMB, CKMBINDEX, TROPONINI in the last 72 hours. Invalid input(s): POCBNP No results for input(s): DDIMER in the last 72 hours. No results for input(s): HGBA1C in the last 72 hours. No results for input(s): CHOL, HDL, LDLCALC, TRIG, CHOLHDL, LDLDIRECT in the last 72 hours. No results for input(s): TSH, T4TOTAL, T3FREE, THYROIDAB in the last 72 hours.  Invalid input(s): FREET3 No results for input(s): VITAMINB12, FOLATE, FERRITIN, TIBC, IRON, RETICCTPCT in the last 72 hours. Coags: Recent Labs    12/08/18 0349 12/09/18 0217  INR 1.80 1.44   Microbiology: Recent Results (from the past 240 hour(s))  MRSA PCR Screening     Status: None   Collection Time: 12/05/18  3:13 PM  Result Value Ref Range Status   MRSA by PCR NEGATIVE NEGATIVE Final    Comment:        The GeneXpert MRSA Assay (FDA approved for NASAL specimens only), is one component of a comprehensive MRSA colonization surveillance program. It is not intended to diagnose MRSA infection nor to guide or monitor treatment for MRSA infections. Performed at [facility], [place] [address], [place], [place] [address]     FURTHER DISCHARGE INSTRUCTIONS:  Get Medicines reviewed and adjusted: Please take all your medications with you for  your next visit with your Primary MD  Laboratory/radiological data: Please request your Primary MD to go over all hospital tests and procedure/radiological results at the follow up, please ask your Primary MD to get all Hospital records sent to his/her office.  In some cases, they will be blood work, cultures and biopsy results pending at the time of your discharge. Please request that your primary care M.D. goes through all the records of your hospital data and follows up on these results.  Also Note the following: If you experience worsening of your admission symptoms, develop shortness of breath, life threatening emergency, suicidal or homicidal thoughts you must seek medical attention immediately by calling 911 or calling your MD immediately  if symptoms less severe.  You must read complete instructions/literature along with all the possible adverse reactions/side effects for all the Medicines you take and that have been prescribed to you. Take any new Medicines after you have completely understood and accpet all the possible adverse reactions/side effects.   Do not drive when taking Pain medications or sleeping medications (Benzodaizepines)  Do not take more than prescribed Pain, Sleep and Anxiety Medications. It is not advisable to combine anxiety,sleep and pain medications without talking with your primary care practitioner  Special Instructions: If you have smoked or chewed Tobacco  in the last 2 yrs please stop smoking, stop any regular Alcohol  and or any Recreational drug use.  Wear Seat belts while driving.  Please note: You were cared for by a hospitalist during your hospital stay. Once you are discharged, your primary care physician will handle any further medical issues. Please note that NO REFILLS for any discharge medications will be authorized once you are discharged, as it is imperative that you return to your primary care  physician (or establish a relationship with a primary  care physician if you do not have one) for your post hospital discharge needs so that they can reassess your need for medications and monitor your lab values.  Total Time spent coordinating discharge including counseling, education and face to face time equals 35 minutes.  SignedOren Binet 12/09/2018 10:16 AM

## 2018-12-11 DIAGNOSIS — Z7901 Long term (current) use of anticoagulants: Secondary | ICD-10-CM | POA: Diagnosis not present

## 2018-12-11 DIAGNOSIS — I483 Typical atrial flutter: Secondary | ICD-10-CM | POA: Diagnosis not present

## 2018-12-11 DIAGNOSIS — Z954 Presence of other heart-valve replacement: Secondary | ICD-10-CM | POA: Diagnosis not present

## 2018-12-12 DIAGNOSIS — J9601 Acute respiratory failure with hypoxia: Secondary | ICD-10-CM | POA: Diagnosis not present

## 2018-12-12 DIAGNOSIS — R2689 Other abnormalities of gait and mobility: Secondary | ICD-10-CM | POA: Diagnosis not present

## 2018-12-12 DIAGNOSIS — N183 Chronic kidney disease, stage 3 (moderate): Secondary | ICD-10-CM | POA: Diagnosis not present

## 2018-12-12 DIAGNOSIS — S20212D Contusion of left front wall of thorax, subsequent encounter: Secondary | ICD-10-CM | POA: Diagnosis not present

## 2018-12-12 DIAGNOSIS — K589 Irritable bowel syndrome without diarrhea: Secondary | ICD-10-CM | POA: Diagnosis not present

## 2018-12-12 DIAGNOSIS — E7800 Pure hypercholesterolemia, unspecified: Secondary | ICD-10-CM | POA: Diagnosis not present

## 2018-12-12 DIAGNOSIS — D538 Other specified nutritional anemias: Secondary | ICD-10-CM | POA: Diagnosis not present

## 2018-12-12 DIAGNOSIS — K3189 Other diseases of stomach and duodenum: Secondary | ICD-10-CM | POA: Diagnosis not present

## 2018-12-12 DIAGNOSIS — I4892 Unspecified atrial flutter: Secondary | ICD-10-CM | POA: Diagnosis not present

## 2018-12-12 DIAGNOSIS — I509 Heart failure, unspecified: Secondary | ICD-10-CM | POA: Diagnosis not present

## 2018-12-12 DIAGNOSIS — Q399 Congenital malformation of esophagus, unspecified: Secondary | ICD-10-CM | POA: Diagnosis not present

## 2018-12-12 DIAGNOSIS — Z8719 Personal history of other diseases of the digestive system: Secondary | ICD-10-CM | POA: Diagnosis not present

## 2018-12-12 DIAGNOSIS — I130 Hypertensive heart and chronic kidney disease with heart failure and stage 1 through stage 4 chronic kidney disease, or unspecified chronic kidney disease: Secondary | ICD-10-CM | POA: Diagnosis not present

## 2018-12-12 DIAGNOSIS — R2681 Unsteadiness on feet: Secondary | ICD-10-CM | POA: Diagnosis not present

## 2018-12-12 DIAGNOSIS — M79602 Pain in left arm: Secondary | ICD-10-CM | POA: Diagnosis not present

## 2018-12-12 DIAGNOSIS — I447 Left bundle-branch block, unspecified: Secondary | ICD-10-CM | POA: Diagnosis not present

## 2018-12-12 DIAGNOSIS — S5012XD Contusion of left forearm, subsequent encounter: Secondary | ICD-10-CM | POA: Diagnosis not present

## 2018-12-12 DIAGNOSIS — D539 Nutritional anemia, unspecified: Secondary | ICD-10-CM | POA: Diagnosis not present

## 2018-12-12 DIAGNOSIS — I080 Rheumatic disorders of both mitral and aortic valves: Secondary | ICD-10-CM | POA: Diagnosis not present

## 2018-12-12 DIAGNOSIS — I480 Paroxysmal atrial fibrillation: Secondary | ICD-10-CM | POA: Diagnosis not present

## 2018-12-12 DIAGNOSIS — G20 Parkinson's disease: Secondary | ICD-10-CM | POA: Diagnosis not present

## 2018-12-12 DIAGNOSIS — W01190D Fall on same level from slipping, tripping and stumbling with subsequent striking against furniture, subsequent encounter: Secondary | ICD-10-CM | POA: Diagnosis not present

## 2018-12-12 DIAGNOSIS — M25512 Pain in left shoulder: Secondary | ICD-10-CM | POA: Diagnosis not present

## 2018-12-12 DIAGNOSIS — S40022D Contusion of left upper arm, subsequent encounter: Secondary | ICD-10-CM | POA: Diagnosis not present

## 2018-12-12 DIAGNOSIS — D631 Anemia in chronic kidney disease: Secondary | ICD-10-CM | POA: Diagnosis not present

## 2018-12-12 DIAGNOSIS — I700 Atherosclerosis of aorta: Secondary | ICD-10-CM | POA: Diagnosis not present

## 2018-12-14 ENCOUNTER — Ambulatory Visit (INDEPENDENT_AMBULATORY_CARE_PROVIDER_SITE_OTHER): Payer: PPO | Admitting: Physician Assistant

## 2018-12-14 ENCOUNTER — Telehealth: Payer: Self-pay | Admitting: Physician Assistant

## 2018-12-14 ENCOUNTER — Encounter: Payer: Self-pay | Admitting: Physician Assistant

## 2018-12-14 VITALS — BP 120/66 | HR 69 | Ht 59.0 in | Wt 145.4 lb

## 2018-12-14 DIAGNOSIS — I10 Essential (primary) hypertension: Secondary | ICD-10-CM | POA: Diagnosis not present

## 2018-12-14 DIAGNOSIS — Z952 Presence of prosthetic heart valve: Secondary | ICD-10-CM | POA: Diagnosis not present

## 2018-12-14 DIAGNOSIS — Z7901 Long term (current) use of anticoagulants: Secondary | ICD-10-CM | POA: Diagnosis not present

## 2018-12-14 DIAGNOSIS — I4891 Unspecified atrial fibrillation: Secondary | ICD-10-CM

## 2018-12-14 MED ORDER — METOPROLOL TARTRATE 25 MG PO TABS: 25.0000 mg | ORAL_TABLET | Freq: Every day | ORAL | 3 refills | Status: DC | PRN

## 2018-12-14 NOTE — Telephone Encounter (Signed)
Spoke with Dr Jacquiline Doe nurse [place] Ar regarding INR of 3.7. They will contact patient with any Warfarin changes

## 2018-12-14 NOTE — Progress Notes (Signed)
Cardiology Office Note   Date:  12/14/2018   ID:  Christy, [name] 06-15-1941, MRN [id number]  PCP:  Leanna Battles, MD Cardiologist:  Peter Martinique, MD 10/09/2018 Christy Ferries, PA-C   No chief complaint on file.   History of Present Illness: [name] is a 77 y.o. female with a history of VHD/AS w/mechanical AVR (2007), HTN, HLD, CKD, h/oRbreast cancer treated withsurgery/chemo/XRT, LBBB,and a Parkinson-like syndrome with unsteady gait with falls,AFib w/ bradycardia 2nd BB, CHA2DS2Vasc is 33 (age x 2, female, HTN) on coumadin  Admitted 12/10-12/14/2019 for Afib RVR, chest pain, anemia (Hgb 5.6) post-fall/trauma, became volume overloaded>>BiPAP>>diuresed, low-dose amio started for elevated HR, may need PPM in the future, no source of bleeding on EGD, follow CBC, on PPI, coumadin ck this week  [name] presents for cardiology follow up.  Her arm is still swollen some, but is better that it was.   PT is working with her at home, she is making progress.   Dr Philip Aspen follows her coumadin, but they would like a check today because the coumadin was increased a few days ago and they are concerned that the coumadin level is now too high. No bleeding issues.   She has some SOB, mostly when she coughs. It is a dry cough. She is not sure if the cough is related to Parkinson's, HH, or GERD.  She is to have labs done next Monday at Dr. Buel Ream office.   She does not have palpitations anymore.  She is in sinus rhythm today, but does not know how long she has been in sinus rhythm.  She and her husband are concerned about what to do she has more rapid atrial fibrillation.  She and her husband are also concerned about what to do if her heart rate gets low.   Past Medical History:  Diagnosis Date  . Arthropathy of cervical facet joint    C2-3  . At risk for falls   . BPV (benign positional vertigo)   . Cancer ([place]) 2004   breast-lumpectomy,nodes ,radiation,chemo  . Chronic neck pain   . CKD (chronic kidney disease), stage II   . Diverticulosis of colon   . Elevated troponin    a. 08/2016 in setting of PAF;  b. 08/2016 low risk MV w/ fixed septal defect (LBBB), EF 53%.  Marland Kitchen GERD (gastroesophageal reflux disease)   . Hiatal hernia   . History of aortic valve stenosis   . History of breast cancer per pt no recurrence   dx 2004-- Left breast DCIS (ER/PR+, HER2 negative) s/p  partial masectomy w/ sln bx and  chemoradiotion (completed 2004)  . History of colon polyps   . History of herpes zoster   . History of kidney stones   . HTN (hypertension)   . Hyperlipidemia   . Hypertrophy of inter-atrial septum    a. 09/2014 Echo: ? of atrial mass; b. 10/2014 Cardiac MRI: no atrial septal mass - polypoid lipomatous hypertrophy of superior atrial septum noted-->Benign.  . Irritable bowel syndrome   . LBBB (left bundle branch block)   . PAF (paroxysmal atrial fibrillation) (HCC)    a. CHA2DS2VASc = 4-->coumadin.  . Parkinsonism [facility])    neurologist-  dr tat  . Personal history of chemotherapy 2004  . Personal history of radiation therapy 2004  . PONV (postoperative nausea and vomiting)   . PVC's (premature ventricular contractions)   . Right ureteral stone   . S/P aortic valve replacement with prosthetic  valve    a. 04/13/2006 s/p St Jude mechnical AVR for severe AS;  b. 09/2014 Echo: EF 40-45%, gr1 DD, mild AI/MR, triv TR/PR.  . SUI (stress urinary incontinence, female)   . Symptomatic anemia   . Venous varices      Past Surgical History:  Procedure Laterality Date  . ANTERIOR AND POSTERIOR REPAIR  1990   cystocele/ rectocele  . AORTIC VALVE REPLACEMENT  04/13/06   dr gerhardt   and Replacement Ascending Aorta (St. Jude mechanical prosthesis)  . APPENDECTOMY  child 1950  . BREAST LUMPECTOMY Left 01/11/2003   malignant  . BREAST SURGERY Left 2004  . CARDIAC CATHETERIZATION  02-22-2001   dr Martinique   minimal  non-obstructive cad/  mild aortic stenosis and aortic root enlargement/  normal LVF, ef 65%  . CARDIAC CATHETERIZATION  03-22-2006    dr Martinique   no significant obstructive cad/  severe aortic stenosis and mild to moderate aortic root enlargement/  upper normal right heart pressures  . CARDIOVASCULAR STRESS TEST  09-30-2011   dr Martinique   lexiscan nuclear study w/ apical thinning  vs  small prior apical infarct w/ no significant ischemia/  hypokinesis of the distal septum and apex, ef 50%  . CARPAL TUNNEL RELEASE Bilateral left 09-09-2004/  right 2007  . CATARACT EXTRACTION W/ INTRAOCULAR LENS  IMPLANT, BILATERAL  2015  . COLONOSCOPY N/A 10/03/2014   Procedure: COLONOSCOPY;  Surgeon: [name], MD;  Location: [place];  Service: Endoscopy;  Laterality: N/A;  . CYSTOSCOPY WITH RETROGRADE PYELOGRAM, URETEROSCOPY AND STENT PLACEMENT Right 09/17/2016   Procedure: CYSTOSCOPY WITH RIGHT RETROGRADE PYELOGRAM, RIGHT URETEROSCOPY AND STENT PLACEMENT LASER LITHOTRIPSY, RIGHT STENT PLACEMENT;  Surgeon: [name], MD;  Location: [facility];  Service: Urology;  Laterality: Right;  . ESOPHAGEAL MANOMETRY N/A 08/12/2014   Procedure: ESOPHAGEAL MANOMETRY (EM);  Surgeon: [name], MD;  Location: [facility];  Service: Endoscopy;  Laterality: N/A;  . ESOPHAGOGASTRODUODENOSCOPY N/A 10/03/2014   Procedure: ESOPHAGOGASTRODUODENOSCOPY (EGD);  Surgeon: [name], MD;  Location[facility];  Service: Endoscopy;  Laterality: N/A;  . ESOPHAGOGASTRODUODENOSCOPY N/A 12/08/2018   Procedure: ESOPHAGOGASTRODUODENOSCOPY (EGD);  Surgeon: Doran Stabler, MD;  Location: [place];  Service: Gastroenterology;  Laterality: N/A;  . [place]  . FOOT SURGERY Right 2013   hammertoe x2  . HOLMIUM LASER APPLICATION Right 04/28/8881   Procedure: HOLMIUM LASER APPLICATION;  Surgeon: [name], MD;  Location: [facility];  Service:  Urology;  Laterality: Right;  . PERCUTANEOUS NEPHROSTOLITHOTOMY  1993  . STONE EXTRACTION WITH BASKET Right 09/17/2016   Procedure: STONE EXTRACTION WITH BASKET;  Surgeon: [name], MD;  Location: [facility];  Service: Urology;  Laterality: Right;  . TOTAL ABDOMINAL HYSTERECTOMY  1971   w/ Bilateral Salpingoophorectomy  . TRANSTHORACIC ECHOCARDIOGRAM  10/24/2014   dr Martinique   hypokinesis of the basal and mid inferoseptal and inferior walls,  ef 40-45%,  grade 1 diastolic dysfunction/  mechanical bileaflet aortic valve noted w/ mild regur., peak grandient 61mHg, valve area 1.35cm^2/  severe MV calcification without stenosis w/ mild regurg., peak gradient 363mg/  mild LAE/  poss. atrial mass (MRI showed benign lipomatous hypertrophy of atrial septum) /tFrazier ButtR/mild TR    Current Outpatient Medications  Medication Sig Dispense Refill  . acetaminophen (TYLENOL) 500 MG tablet Take 1,000 mg by mouth every 6 (six) hours as needed (head pain).     .Marland Kitchen  amiodarone (PACERONE) 200 MG tablet Take 1 tablet (200 mg total) by mouth daily. 30 tablet 0  . calcium carbonate (TUMS - DOSED IN MG ELEMENTAL CALCIUM) 500 MG chewable tablet Chew 1 tablet by mouth daily.    . calcium citrate-vitamin D (CITRACAL+D) 315-200 MG-UNIT per tablet Take 1 tablet by mouth 3 (three) times daily.    . carbidopa-levodopa (SINEMET IR) 25-100 MG tablet TAKE 2 TABLETS BY MOUTH THREE TIMES DAILY (Patient taking differently: Take 2 tablets by mouth 3 (three) times daily. ) 540 tablet 1  . Cholecalciferol (VITAMIN D3) 2000 units TABS Take 1 capsule by mouth daily.    . famotidine-calcium carbonate-magnesium hydroxide (PEPCID COMPLETE) 10-800-165 MG CHEW chewable tablet Chew 1 tablet by mouth daily as needed (heartburn).    Marland Kitchen KLOR-CON M20 20 MEQ tablet Take 20 mEq by mouth daily.    Marland Kitchen KRILL OIL PO Take 1 capsule by mouth daily.     . Melatonin 3 MG TABS Take 3 mg by mouth at bedtime as needed (sleep).     .  Multiple Vitamin (MULTIVITAMIN) tablet Take 1 tablet by mouth daily at 12 noon.     . Multiple Vitamins-Minerals (VISION-VITE PRESERVE PO) Take 1 tablet by mouth 2 (two) times daily.    . pantoprazole (PROTONIX) 40 MG tablet Take 40 mg by mouth daily.    . Probiotic Product (PROBIOTIC-10 PO) Take 1 capsule by mouth daily.     . simvastatin (ZOCOR) 20 MG tablet Take 20 mg by mouth every evening.    . vitamin B-12 (CYANOCOBALAMIN) 1000 MCG tablet Take 1,000 mcg by mouth every morning.    . warfarin (COUMADIN) 5 MG tablet Take 5 mg by mouth. Take 7.5 mg by mouth every Monday, Wednesday, Friday. Take 5 mg by mouth all other days.     No current facility-administered medications for this visit.     Allergies:   Demerol [meperidine]; Oxycodone; Adhesive [tape]; Ivp dye [iodinated diagnostic agents]; and Phenergan [promethazine]    Social History:  The patient  reports that she has never smoked. She has never used smokeless tobacco. She reports that she does not drink alcohol or use drugs.   Family History:  The patient's family history includes Heart disease in her mother; Heart disease (age of onset: 15) in her father; Other in her brother.  She indicated that her mother is deceased. She indicated that her father is deceased. She indicated that all of her three sisters are alive. She indicated that only one of her six brothers is alive. She indicated that her maternal grandmother is deceased. She indicated that her maternal grandfather is deceased. She indicated that her paternal grandmother is deceased. She indicated that her paternal grandfather is deceased.   ROS:  Please see the history of present illness. All other systems are reviewed and negative.    PHYSICAL EXAM: VS:  BP 120/66   Pulse 69   Ht _0  (1.499 m)   Wt 145 lb 6.4 oz (66 kg)   BMI 29.37 kg/m  , BMI Body mass index is 29.37 kg/m. GEN: Well nourished, well developed, female in no acute distress HEENT: normal for age    Neck: no JVD, no carotid bruit, no masses Cardiac: RRR; crisp valve click with soft murmur, no rubs, or gallops Respiratory:  clear to auscultation bilaterally, normal work of breathing GI: soft, nontender, nondistended, + BS MS: no deformity or atrophy; no edema; distal pulses are 2+ in all 4 extremities  Skin: warm  and dry, no rash Neuro:  Strength and sensation are intact Psych: euthymic mood, full affect   EKG:  EKG is ordered today. The ekg ordered today demonstrates sinus rhythm, heart rate 69 possible left bundle branch block with QRS duration 124 ms.  Morphology is essentially unchanged from 12/05/2018 ECG but she was in atrial flutter at the time  11/17/18: TTE Study Conclusions - Left ventricle: The cavity size was normal. Wall thickness was normal. Systolic function was mildly reduced. The estimated ejection fraction was in the range of 45% to 50%. Features are consistent with a pseudonormal left ventricular filling pattern, with concomitant abnormal relaxation and increased filling pressure (grade 2 diastolic dysfunction). - Aortic valve: A mechanical prosthesis was present. There was mild regurgitation. Valve area (VTI): 1.43 cm^2. Valve area (Vmax): 1.28 cm^2. Valve area (Vmean): 1.2 cm^2. - Mitral valve: Mildly to moderately calcified annulus. Valve area by continuity equation (using LVOT flow): 1.55 cm^2. - Left atrium: The atrium was mildly dilated.   09/21/16 stress myoview IMPRESSION: 1. No evidence reversible ischemia. Fixed defect in the septal wall with differential including infarction versus artifact from LEFT bundle branch block. 2. Septal dyskinesia in patient with LEFT bundle branch block. 3. Left ventricular ejection fraction 53% 4. Non invasive risk stratification*: Low   Recent Labs: 12/05/2018: Magnesium 1.7; TSH 1.304 12/08/2018: BUN 27; Creatinine, Ser 1.33; Potassium 3.8; Sodium 137 12/09/2018: Hemoglobin 8.9; Platelets  205  CBC    Component Value Date/Time   WBC 5.9 12/09/2018 0217   RBC 2.85 (L) 12/09/2018 0217   HGB 8.9 (L) 12/09/2018 0217   HGB 12.5 03/06/2008 0844   HCT 28.4 (L) 12/09/2018 0217   HCT 35.6 03/06/2008 0844   PLT 205 12/09/2018 0217   PLT 164 03/06/2008 0844   MCV 99.6 12/09/2018 0217   MCV 98.3 03/06/2008 0844   MCH 31.2 12/09/2018 0217   MCHC 31.3 12/09/2018 0217   RDW 17.1 (H) 12/09/2018 0217   RDW 12.8 03/06/2008 0844   LYMPHSABS 1.7 12/05/2018 0715   LYMPHSABS 0.9 03/06/2008 0844   MONOABS 0.7 12/05/2018 0715   MONOABS 0.4 03/06/2008 0844   EOSABS 0.1 12/05/2018 0715   EOSABS 0.1 03/06/2008 0844   BASOSABS 0.1 12/05/2018 0715   BASOSABS 0.0 03/06/2008 0844   CMP Latest Ref Rng & Units 12/08/2018 12/07/2018 12/06/2018  Glucose 70 - 99 mg/dL 116(H) 109(H) 98  BUN 8 - 23 mg/dL 27(H) 34(H) 29(H)  Creatinine 0.44 - 1.00 mg/dL 1.33(H) 1.80(H) 1.52(H)  Sodium 135 - 145 mmol/L 137 139 136  Potassium 3.5 - 5.1 mmol/L 3.8 3.4(L) 2.9(L)  Chloride 98 - 111 mmol/L 97(L) 99 97(L)  CO2 22 - 32 mmol/L _0 Calcium 8.9 - 10.3 mg/dL 8.1(L) 7.9(L) 8.2(L)  Total Protein 6.5 - 8.1 g/dL - - -  Total Bilirubin 0.3 - 1.2 mg/dL - - -  Alkaline Phos 38 - 126 U/L - - -  AST 15 - 41 U/L - - -  ALT 14 - 54 U/L - - -     Lipid Panel Lab Results  Component Value Date   CHOL 145 09/30/2011   HDL 77 09/30/2011   LDLCALC 60 09/30/2011   TRIG 42 09/30/2011   CHOLHDL 1.9 09/30/2011      Wt Readings from Last 3 Encounters:  12/14/18 145 lb 6.4 oz (66 kg)  12/08/18 137 lb (62.1 kg)  11/21/18 138 lb (62.6 kg)     Other studies Reviewed: Additional studies/ records that were  reviewed today include: Office notes, hospital records and testing.  ASSESSMENT AND PLAN:  1.  Persistent atrial fibrillation, rapid ventricular response: Currently, her heart rate is normal and she is maintaining sinus rhythm.  She has had problems with beta-blockers in the past with bradycardia.  However,  when she is in atrial fibrillation, she tends to be fast. - Give her a prescription for metoprolol tartrate 25 mg to be taken only as needed for heart rate greater than 110 - Gave her guidelines on bradycardia, for heart rate lower than 35, she is to call 911.  If her heart rate is above 35 and she is symptomatic, let us know.  If her heart rate is low but she is not symptomatic, document the low heart rates and reported the next office visit. - Continue amiodarone at the current dose and follow-up as scheduled with EP in January.  2.  Hypertension: Her blood pressures well controlled on current therapy, no med changes.  3.  History of mechanical aortic valve on chronic anticoagulation Coumadin: -She has a crisp valve click and only a very soft murmur, the valve was working well on recent echo. - The patient's husband is concerned because her Coumadin was increased a couple of days ago and he saw online that there could be an interaction with the amiodarone. -The INR was checked today and is just above goal at 3.7.  This information was communicated with Dr. Buel Ream office and they were advised that nothing needs to be done acutely.  Contact the office for any bleeding issues and keep appointment next week as scheduled for blood work   Current medicines are reviewed at length with the patient today.  The patient has concerns regarding medicines.  The following changes have been made: Add PRN metoprolol  Labs/ tests ordered today include:   Orders Placed This Encounter  Procedures  . EKG 12-Lead     Disposition:   FU with Peter Martinique, MD  Signed, Christy Ferries, PA-C  12/14/2018 4:15 PM    [facility] Phone: [phone]; Fax: [phone]

## 2018-12-14 NOTE — Patient Instructions (Addendum)
Medication Instructions:  Take Metoprolol 25 mg as needed for heart rate above 110.  If you need a refill on your cardiac medications before your next appointment, please call your pharmacy.   Follow-Up: At [facility], you and your health needs are our priority.  As part of our continuing mission to provide you with exceptional heart care, we have created designated Provider Care Teams.  These Care Teams include your primary Cardiologist (physician) and Advanced Practice Providers (APPs -  Physician Assistants and Nurse Practitioners) who all work together to provide you with the care you need, when you need it. Marland Kitchen Keep your scheduled follow-up appointment with Tommye Standard, PA on 01/01/19.  Please schedule an appointment with Dr. Martinique for April 2020.  Any Other Special Instructions Will Be Listed Below (If Applicable). Your physician has requested that you regularly monitor and record your blood pressure readings at home. Please use the same machine at various times of the day to check your readings and record them to bring to your follow-up visit.  If your heart rate is less than 35, please go to the emergency room. If your heart rate is above 35 and you have no symptoms, record this. If your heart rate is above 110 at rest, please take one Metoprolol 25 mg tablet.  Please use MyChart.  Contact our office if your are having symptoms or are not feeling well.  Your INR today was 3.7. Our pharmacist will call you.

## 2018-12-14 NOTE — Telephone Encounter (Signed)
[place] about INR.

## 2018-12-18 DIAGNOSIS — N390 Urinary tract infection, site not specified: Secondary | ICD-10-CM | POA: Diagnosis not present

## 2018-12-18 DIAGNOSIS — J9601 Acute respiratory failure with hypoxia: Secondary | ICD-10-CM | POA: Diagnosis not present

## 2018-12-18 DIAGNOSIS — Z6828 Body mass index (BMI) 28.0-28.9, adult: Secondary | ICD-10-CM | POA: Diagnosis not present

## 2018-12-18 DIAGNOSIS — G20 Parkinson's disease: Secondary | ICD-10-CM | POA: Diagnosis not present

## 2018-12-18 DIAGNOSIS — Z952 Presence of prosthetic heart valve: Secondary | ICD-10-CM | POA: Diagnosis not present

## 2018-12-18 DIAGNOSIS — R300 Dysuria: Secondary | ICD-10-CM | POA: Diagnosis not present

## 2018-12-18 DIAGNOSIS — K922 Gastrointestinal hemorrhage, unspecified: Secondary | ICD-10-CM | POA: Diagnosis not present

## 2018-12-18 DIAGNOSIS — I5023 Acute on chronic systolic (congestive) heart failure: Secondary | ICD-10-CM | POA: Diagnosis not present

## 2018-12-18 DIAGNOSIS — I4891 Unspecified atrial fibrillation: Secondary | ICD-10-CM | POA: Diagnosis not present

## 2018-12-18 DIAGNOSIS — N183 Chronic kidney disease, stage 3 (moderate): Secondary | ICD-10-CM | POA: Diagnosis not present

## 2018-12-18 DIAGNOSIS — Z7901 Long term (current) use of anticoagulants: Secondary | ICD-10-CM | POA: Diagnosis not present

## 2018-12-18 DIAGNOSIS — D6489 Other specified anemias: Secondary | ICD-10-CM | POA: Diagnosis not present

## 2018-12-18 DIAGNOSIS — R82998 Other abnormal findings in urine: Secondary | ICD-10-CM | POA: Diagnosis not present

## 2018-12-28 DIAGNOSIS — I4891 Unspecified atrial fibrillation: Secondary | ICD-10-CM | POA: Diagnosis not present

## 2018-12-28 DIAGNOSIS — Z7901 Long term (current) use of anticoagulants: Secondary | ICD-10-CM | POA: Diagnosis not present

## 2018-12-28 DIAGNOSIS — I358 Other nonrheumatic aortic valve disorders: Secondary | ICD-10-CM | POA: Diagnosis not present

## 2018-12-28 NOTE — Progress Notes (Signed)
Cardiology Office Note Date:  01/01/2019  Patient ID:  [name], [name] [birth year], MRN [id number] PCP:  Leanna Battles, MD  Cardiologist:  Dr. Martinique Electrophysiologist: Caryl Comes -[place]   Chief Complaint: hospital f/u  History of Present Illness: [name] is a 78 y.o. female with history of VHD/AS w/mechanical AVR (2007), HTN, HLD, CKD, h/o R breast cancer treated with surgery/chemo/XRT, LBBB, and a Parkinson-like syndrome with unsteady gait with falls, and AFib.  She comes today to be seen for Dr. Caryl Comes.  (he last saw her in 2017), though more recently during a hospital visit saw Dr. Lovena Le.   She has h/o bradycardia requiring stopping her BB, in 2017 she was referred to Dr. Caryl Comes who recommended given infrequent AFib no changes, though if burden was increased to revisit BB vs AAD like Tikosyn.  She was hospitalized 12/05/18 after a fall/trauma with shoulder pain with out patient w/u that led to observation of large hematoma of the L chest and markedly large area of ecchymosis to her L torso, her INR reported to have been 4.3 and was being adjusted though she developed acute onset of palpitations/chest pressure prompting an ER visit, she was in AFlutter w/RVR and a Hgb of 5.6.  He pain, anemia felt to be driving RVR, she had improvement with periods of SR as well as some RVR despite blood/medical therapy.  She underwent GI w/u negative and cleared for resumption of her a/c.  Amiodarone (low dose) was started  Since last being seen in office by Rosaria Ferries, PA, she reports doing very well. Her heart rates have been stable at home. She has not noted any more atrial fibrillation.    Past Medical History:  Diagnosis Date  . Arthropathy of cervical facet joint    C2-3  . At risk for falls   . BPV (benign positional vertigo)   . Cancer ([place]) 2004    breast-lumpectomy,nodes ,radiation,chemo  . Chronic neck pain   . CKD (chronic kidney disease), stage II   .  Diverticulosis of colon   . Elevated troponin    a. 08/2016 in setting of PAF;  b. 08/2016 low risk MV w/ fixed septal defect (LBBB), EF 53%.  Marland Kitchen GERD (gastroesophageal reflux disease)   . Hiatal hernia   . History of aortic valve stenosis   . History of breast cancer per pt no recurrence   dx 2004-- Left breast DCIS (ER/PR+, HER2 negative) s/p  partial masectomy w/ sln bx and  chemoradiotion (completed 2004)  . History of colon polyps   . History of herpes zoster   . History of kidney stones   . HTN (hypertension)   . Hyperlipidemia   . Hypertrophy of inter-atrial septum    a. 09/2014 Echo: ? of atrial mass; b. 10/2014 Cardiac MRI: no atrial septal mass - polypoid lipomatous hypertrophy of superior atrial septum noted-->Benign.  . Irritable bowel syndrome   . LBBB (left bundle branch block)   . PAF (paroxysmal atrial fibrillation) (HCC)    a. CHA2DS2VASc = 4-->coumadin.  . [facility])    neurologist-  dr tat  . Personal history of chemotherapy 2004  . Personal history of radiation therapy 2004  . PONV (postoperative nausea and vomiting)   . PVC's (premature ventricular contractions)   . Right ureteral stone   . S/P aortic valve replacement with prosthetic valve    a. 04/13/2006 s/p St Jude mechnical AVR for severe AS;  b. 09/2014 Echo: EF 40-45%, gr1 DD,  mild AI/MR, triv TR/PR.  . SUI (stress urinary incontinence, female)   . Symptomatic anemia   . Venous varices      Past Surgical History:  Procedure Laterality Date  . ANTERIOR AND POSTERIOR REPAIR  1990   cystocele/ rectocele  . AORTIC VALVE REPLACEMENT  04/13/06   dr gerhardt   and Replacement Ascending Aorta (St. Jude mechanical prosthesis)  . APPENDECTOMY  child 1950  . BREAST LUMPECTOMY Left 01/11/2003   malignant  . BREAST SURGERY Left 2004  . CARDIAC CATHETERIZATION  02-22-2001   dr Martinique   minimal non-obstructive cad/  mild aortic stenosis and aortic root enlargement/  normal LVF, ef 65%  . CARDIAC  CATHETERIZATION  03-22-2006    dr Martinique   no significant obstructive cad/  severe aortic stenosis and mild to moderate aortic root enlargement/  upper normal right heart pressures  . CARDIOVASCULAR STRESS TEST  09-30-2011   dr Martinique   lexiscan nuclear study w/ apical thinning  vs  small prior apical infarct w/ no significant ischemia/  hypokinesis of the distal septum and apex, ef 50%  . CARPAL TUNNEL RELEASE Bilateral left 09-09-2004/  right 2007  . CATARACT EXTRACTION W/ INTRAOCULAR LENS  IMPLANT, BILATERAL  2015  . COLONOSCOPY N/A 10/03/2014   Procedure: COLONOSCOPY;  Surgeon: [name], MD;  Location: [facility];  Service: Endoscopy;  Laterality: N/A;  . CYSTOSCOPY WITH RETROGRADE PYELOGRAM, URETEROSCOPY AND STENT PLACEMENT Right 09/17/2016   Procedure: CYSTOSCOPY WITH RIGHT RETROGRADE PYELOGRAM, RIGHT URETEROSCOPY AND STENT PLACEMENT LASER LITHOTRIPSY, RIGHT STENT PLACEMENT;  Surgeon: [name], MD;  Location: [facility];  Service: Urology;  Laterality: Right;  . ESOPHAGEAL MANOMETRY N/A 08/12/2014   Procedure: ESOPHAGEAL MANOMETRY (EM);  Surgeon: [name], MD;  Location: [facility];  Service: Endoscopy;  Laterality: N/A;  . ESOPHAGOGASTRODUODENOSCOPY N/A 10/03/2014   Procedure: ESOPHAGOGASTRODUODENOSCOPY (EGD);  Surgeon: [name], MD;  Location: [facility]COPY;  Service: Endoscopy;  Laterality: N/A;  . ESOPHAGOGASTRODUODENOSCOPY N/A 12/08/2018   Procedure: ESOPHAGOGASTRODUODENOSCOPY (EGD);  Surgeon: Doran Stabler, MD;  Location: [facility];  Service: Gastroenterology;  Laterality: N/A;  . [place]  . FOOT SURGERY Right 2013   hammertoe x2  . HOLMIUM LASER APPLICATION Right 6/38/7564   Procedure: HOLMIUM LASER APPLICATION;  Surgeon: [name], MD;  Location: [facility];  Service: Urology;  Laterality: Right;  . PERCUTANEOUS NEPHROSTOLITHOTOMY  1993  . STONE EXTRACTION WITH BASKET  Right 09/17/2016   Procedure: STONE EXTRACTION WITH BASKET;  Surgeon: [name], MD;  Location: [facility];  Service: Urology;  Laterality: Right;  . TOTAL ABDOMINAL HYSTERECTOMY  1971   w/ Bilateral Salpingoophorectomy  . TRANSTHORACIC ECHOCARDIOGRAM  10/24/2014   dr Martinique   hypokinesis of the basal and mid inferoseptal and inferior walls,  ef 40-45%,  grade 1 diastolic dysfunction/  mechanical bileaflet aortic valve noted w/ mild regur., peak grandient 12mHg, valve area 1.35cm^2/  severe MV calcification without stenosis w/ mild regurg., peak gradient 357mg/  mild LAE/  poss. atrial mass (MRI showed benign lipomatous hypertrophy of atrial septum) /tFrazier ButtR/mild TR    Current Outpatient Medications  Medication Sig Dispense Refill  . acetaminophen (TYLENOL) 500 MG tablet Take 1,000 mg by mouth every 6 (six) hours as needed (head pain).     . Marland Kitchenmiodarone (PACERONE) 200 MG tablet Take 1 tablet (200 mg total) by mouth daily. 90 tablet 1  . calcium carbonate (  TUMS - DOSED IN MG ELEMENTAL CALCIUM) 500 MG chewable tablet Chew 1 tablet by mouth daily.    . calcium citrate-vitamin D (CITRACAL+D) 315-200 MG-UNIT per tablet Take 1 tablet by mouth 3 (three) times daily.    . carbidopa-levodopa (SINEMET IR) 25-100 MG tablet TAKE 2 TABLETS BY MOUTH THREE TIMES DAILY (Patient taking differently: Take 2 tablets by mouth 3 (three) times daily. ) 540 tablet 1  . Cholecalciferol (VITAMIN D3) 2000 units TABS Take 1 capsule by mouth daily.    . famotidine-calcium carbonate-magnesium hydroxide (PEPCID COMPLETE) 10-800-165 MG CHEW chewable tablet Chew 1 tablet by mouth daily as needed (heartburn).    Marland Kitchen KLOR-CON M20 20 MEQ tablet Take 20 mEq by mouth daily.    Marland Kitchen KRILL OIL PO Take 1 capsule by mouth daily.     . Melatonin 3 MG TABS Take 3 mg by mouth at bedtime as needed (sleep).     . metoprolol tartrate (LOPRESSOR) 25 MG tablet Take 1 tablet (25 mg total) by mouth daily as needed (if heart  rate is above 110). 180 tablet 3  . Multiple Vitamin (MULTIVITAMIN) tablet Take 1 tablet by mouth daily at 12 noon.     . Multiple Vitamins-Minerals (VISION-VITE PRESERVE PO) Take 1 tablet by mouth 2 (two) times daily.    . pantoprazole (PROTONIX) 40 MG tablet Take 40 mg by mouth daily.    . Probiotic Product (PROBIOTIC-10 PO) Take 1 capsule by mouth daily.     . simvastatin (ZOCOR) 20 MG tablet Take 20 mg by mouth every evening.    . vitamin B-12 (CYANOCOBALAMIN) 1000 MCG tablet Take 1,000 mcg by mouth every morning.    . warfarin (COUMADIN) 5 MG tablet Take 5 mg by mouth. Take 7.5 mg by mouth every Monday, Wednesday, Friday. Take 5 mg by mouth all other days.     No current facility-administered medications for this visit.     Allergies:   Demerol [meperidine]; Oxycodone; Adhesive [tape]; Ivp dye [iodinated diagnostic agents]; and Phenergan [promethazine]   Social History:  The patient  reports that she has never smoked. She has never used smokeless tobacco. She reports that she does not drink alcohol or use drugs.   Family History:  The patient's family history includes Heart disease in her mother; Heart disease (age of onset: 39) in her father; Other in her brother.  ROS:  Please see the history of present illness.  All other systems are reviewed and otherwise negative.   PHYSICAL EXAM:  VS:  BP 132/64   Pulse 69   Ht 4' 11.75" (1.518 m)   Wt 142 lb (64.4 kg)   SpO2 99%   BMI 27.97 kg/m  BMI: Body mass index is 27.97 kg/m. Well nourished, well developed, in no acute distress  HEENT: normocephalic, atraumatic  Neck: no JVD, carotid bruits or masses Cardiac:   RRR  Lungs:   CTA b/l, no wheezing, rhonchi or rales  Abd: soft, nontender MS: no deformity or atrophy Ext:  no edema  Skin: warm and dry, no rash, ecchymosis Neuro:  No gross deficits appreciated Psych: euthymic mood, full affect  EKG:  EKG is not ordered today  10/17/18: TTE Study Conclusions - Left ventricle:  The cavity size was normal. Wall thickness was   normal. Systolic function was mildly reduced. The estimated   ejection fraction was in the range of 45% to 50%. Features are   consistent with a pseudonormal left ventricular filling pattern,   with concomitant abnormal  relaxation and increased filling   pressure (grade 2 diastolic dysfunction). - Aortic valve: A mechanical prosthesis was present. There was mild   regurgitation. Valve area (VTI): 1.43 cm^2. Valve area (Vmax):   1.28 cm^2. Valve area (Vmean): 1.2 cm^2. - Mitral valve: Mildly to moderately calcified annulus. Valve area   by continuity equation (using LVOT flow): 1.55 cm^2. - Left atrium: The atrium was mildly dilated.   09/21/18 stress myoview IMPRESSION: 1. No evidence reversible ischemia. Fixed defect in the septal wall with differential including infarction versus artifact from LEFT bundle branch block. 2. Septal dyskinesia in patient with LEFT bundle branch block. 3. Left ventricular ejection fraction 53% 4. Non invasive risk stratification*: Low    Recent Labs: 12/05/2018: Magnesium 1.7; TSH 1.304 12/08/2018: BUN 27; Creatinine, Ser 1.33; Potassium 3.8; Sodium 137 12/09/2018: Hemoglobin 8.9; Platelets 205  No results found for requested labs within last 8760 hours.   CrCl cannot be calculated (Patient's most recent lab result is older than the maximum 21 days allowed.).   Wt Readings from Last 3 Encounters:  01/01/19 142 lb (64.4 kg)  12/14/18 145 lb 6.4 oz (66 kg)  12/08/18 137 lb (62.1 kg)     Other studies reviewed: Additional studies/records reviewed today include: summarized above  ASSESSMENT AND PLAN:  1. Paroxysmal atrial fibrillation/atrial flutter/tachy/brady  Continue Warfarin for CHADS2VASC of 4 She is maintaining SR on low dose amiodarone With prior history of bradycardia, she may require pacing in the future as further medical therapy will be limited Will follow for now I have advised her  to follow heart rates at home and if they start to drift down to call and let us know. At that point, would decrease or stop amiodarone and consider PPM if AF recurs.   2. Mechanical AVR Stable No change required today  3. NICM/chronic systolic heart failure Euvolemic on exam Continue current therapy   Disposition: F/u with Dr Lovena Le 6 weeks  Current medicines are reviewed at length with the patient today.  The patient did not have any concerns regarding medicines.  Signed, Chanetta Marshall, NP 01/01/2019 8:30 AM   [address] [place] [address] [phone] (office)  [phone] (fax)

## 2019-01-01 ENCOUNTER — Ambulatory Visit (INDEPENDENT_AMBULATORY_CARE_PROVIDER_SITE_OTHER): Payer: PPO | Admitting: Nurse Practitioner

## 2019-01-01 VITALS — BP 132/64 | HR 69 | Ht 59.75 in | Wt 142.0 lb

## 2019-01-01 DIAGNOSIS — I495 Sick sinus syndrome: Secondary | ICD-10-CM | POA: Diagnosis not present

## 2019-01-01 DIAGNOSIS — I480 Paroxysmal atrial fibrillation: Secondary | ICD-10-CM | POA: Diagnosis not present

## 2019-01-01 DIAGNOSIS — Z952 Presence of prosthetic heart valve: Secondary | ICD-10-CM | POA: Diagnosis not present

## 2019-01-01 MED ORDER — AMIODARONE HCL 200 MG PO TABS: 200.0000 mg | ORAL_TABLET | Freq: Every day | ORAL | 1 refills | Status: DC

## 2019-01-01 NOTE — Patient Instructions (Addendum)
Medication Instructions:   Your physician recommends that you continue on your current medications as directed. Please refer to the Current Medication list given to you today.   If you need a refill on your cardiac medications before your next appointment, please call your pharmacy.   Lab work: NONE ORDERED  TODAY   If you have labs (blood work) drawn today and your tests are completely normal, you will receive your results only by: Marland Kitchen MyChart Message (if you have MyChart) OR . A paper copy in the mail If you have any lab test that is abnormal or we need to change your treatment, we will call you to review the results.   Testing/Procedures: NONE ORDERED  TODAY   Follow-Up: WITH TAYLOR IN 6 WEEKS    Any Other Special Instructions Will Be Listed Below (If Applicable).

## 2019-01-04 ENCOUNTER — Ambulatory Visit: Payer: PPO | Admitting: Occupational Therapy

## 2019-01-04 ENCOUNTER — Ambulatory Visit: Payer: PPO

## 2019-01-04 ENCOUNTER — Ambulatory Visit: Payer: PPO | Admitting: Physical Therapy

## 2019-01-04 DIAGNOSIS — I4891 Unspecified atrial fibrillation: Secondary | ICD-10-CM | POA: Diagnosis not present

## 2019-01-04 DIAGNOSIS — Z7901 Long term (current) use of anticoagulants: Secondary | ICD-10-CM | POA: Diagnosis not present

## 2019-01-04 DIAGNOSIS — I358 Other nonrheumatic aortic valve disorders: Secondary | ICD-10-CM | POA: Diagnosis not present

## 2019-01-07 ENCOUNTER — Other Ambulatory Visit: Payer: Self-pay

## 2019-01-07 ENCOUNTER — Emergency Department (HOSPITAL_BASED_OUTPATIENT_CLINIC_OR_DEPARTMENT_OTHER): Payer: PPO

## 2019-01-07 ENCOUNTER — Encounter (HOSPITAL_BASED_OUTPATIENT_CLINIC_OR_DEPARTMENT_OTHER): Payer: Self-pay | Admitting: Adult Health

## 2019-01-07 ENCOUNTER — Emergency Department (HOSPITAL_BASED_OUTPATIENT_CLINIC_OR_DEPARTMENT_OTHER)
Admission: EM | Admit: 2019-01-07 | Discharge: 2019-01-07 | Disposition: A | Discharge status: Home / Self Care | Payer: PPO | Attending: Emergency Medicine | Admitting: Emergency Medicine

## 2019-01-07 DIAGNOSIS — Z5181 Encounter for therapeutic drug level monitoring: Secondary | ICD-10-CM

## 2019-01-07 DIAGNOSIS — Y9389 Activity, other specified: Secondary | ICD-10-CM | POA: Insufficient documentation

## 2019-01-07 DIAGNOSIS — N182 Chronic kidney disease, stage 2 (mild): Secondary | ICD-10-CM | POA: Diagnosis not present

## 2019-01-07 DIAGNOSIS — G20 Parkinson's disease: Secondary | ICD-10-CM | POA: Insufficient documentation

## 2019-01-07 DIAGNOSIS — Y999 Unspecified external cause status: Secondary | ICD-10-CM | POA: Diagnosis not present

## 2019-01-07 DIAGNOSIS — Z79899 Other long term (current) drug therapy: Secondary | ICD-10-CM | POA: Insufficient documentation

## 2019-01-07 DIAGNOSIS — I480 Paroxysmal atrial fibrillation: Secondary | ICD-10-CM | POA: Diagnosis not present

## 2019-01-07 DIAGNOSIS — Z7901 Long term (current) use of anticoagulants: Secondary | ICD-10-CM | POA: Insufficient documentation

## 2019-01-07 DIAGNOSIS — S0990XA Unspecified injury of head, initial encounter: Secondary | ICD-10-CM

## 2019-01-07 DIAGNOSIS — W01190A Fall on same level from slipping, tripping and stumbling with subsequent striking against furniture, initial encounter: Secondary | ICD-10-CM | POA: Diagnosis not present

## 2019-01-07 DIAGNOSIS — Y92003 Bedroom of unspecified non-institutional (private) residence as the place of occurrence of the external cause: Secondary | ICD-10-CM | POA: Diagnosis not present

## 2019-01-07 DIAGNOSIS — I129 Hypertensive chronic kidney disease with stage 1 through stage 4 chronic kidney disease, or unspecified chronic kidney disease: Secondary | ICD-10-CM | POA: Insufficient documentation

## 2019-01-07 DIAGNOSIS — S199XXA Unspecified injury of neck, initial encounter: Secondary | ICD-10-CM | POA: Diagnosis not present

## 2019-01-07 LAB — CBC
HCT: 38.3 % (ref 36.0–46.0)
Hemoglobin: 11.9 g/dL — ABNORMAL LOW (ref 12.0–15.0)
MCH: 32 pg (ref 26.0–34.0)
MCHC: 31.1 g/dL (ref 30.0–36.0)
MCV: 103 fL — ABNORMAL HIGH (ref 80.0–100.0)
Platelets: 161 10*3/uL (ref 150–400)
RBC: 3.72 MIL/uL — ABNORMAL LOW (ref 3.87–5.11)
RDW: 16.2 % — ABNORMAL HIGH (ref 11.5–15.5)
WBC: 5.1 10*3/uL (ref 4.0–10.5)
nRBC: 0 % (ref 0.0–0.2)

## 2019-01-07 LAB — APTT: aPTT: 27 seconds (ref 24–36)

## 2019-01-07 LAB — BASIC METABOLIC PANEL
Anion gap: 7 (ref 5–15)
BUN: 25 mg/dL — ABNORMAL HIGH (ref 8–23)
CO2: 27 mmol/L (ref 22–32)
Calcium: 9.7 mg/dL (ref 8.9–10.3)
Chloride: 105 mmol/L (ref 98–111)
Creatinine, Ser: 1.39 mg/dL — ABNORMAL HIGH (ref 0.44–1.00)
GFR calc Af Amer: 42 mL/min — ABNORMAL LOW (ref >60)
GFR calc non Af Amer: 36 mL/min — ABNORMAL LOW (ref >60)
Glucose, Bld: 91 mg/dL (ref 70–99)
Potassium: 4 mmol/L (ref 3.5–5.1)
Sodium: 139 mmol/L (ref 135–145)

## 2019-01-07 LAB — PROTIME-INR
INR: 1.52
Prothrombin Time: 18.2 seconds — ABNORMAL HIGH (ref 11.4–15.2)

## 2019-01-07 NOTE — ED Notes (Signed)
Patient walked per her normal baseline

## 2019-01-07 NOTE — ED Notes (Signed)
Attempted lab draw- unable to obtain.

## 2019-01-07 NOTE — ED Provider Notes (Signed)
Patient fell this morning 11 AM when she was reaching around to get her walker.  She struck her head crating laceration at occipital scalp.  She denies pain anywhere.  No loss of consciousness.  No other associated symptoms.  Last tetanus shot 3 years ago.  On exam pleasant alert Glasgow Coma Score 15 HEENT exam no facial asymmetry.  There is a tiny linear laceration, at the occipital scalp with no surrounding hematoma no active bleeding.  Neck supple nontender.  Chest is nontender lungs clear to auscultation abdomen nondistended nontender pelvis stable nontender all 4 extremities well contusion abrasion or tenderness neurovascular intact neurologic moves all extremities well cranial nerves II through XII grossly intact.   Orlie Dakin, MD 01/07/19 1505

## 2019-01-07 NOTE — ED Notes (Signed)
Pt verbalized understanding of dc instructions.

## 2019-01-07 NOTE — Discharge Instructions (Addendum)
Follow-up with your doctor on Thursday for recheck of your INR. Return to the emergency room if you develop severe worsening headache, vision changes, vomiting, dizziness/weakness. Return with any new, worsening, concerning symptoms.

## 2019-01-07 NOTE — ED Provider Notes (Signed)
[facility] EMERGENCY DEPARTMENT Provider Note   CSN: [id number] Arrival date & time: 01/07/19  1220     History   Chief Complaint Chief Complaint  Patient presents with  . Head Injury    HPI [name] is a 78 y.o. female senting for evaluation of head injury after fall.  Patient states she was making the bed when she twisted, lost her balance and fell backwards hitting her head.  She denies loss of consciousness.  She denies feeling poorly prior to the fall.  She denies chest pain or shortness of breath prior to, during, or after the fall.  She is on Coumadin, recently was supratherapeutic so her dose has been reduced.  She does not return back to her full dose until tonight.  She reports mild pain at her posterior head where she was injured, denies vision changes, slurred speech, neck pain, back pain, chest pain, nausea, vomiting, abdominal pain, loss of bowel bladder control, numbness, or tingling.  She has not taken anything for pain including Tylenol.  Bleeding was easily controlled with direct pressure.  HPI  Past Medical History:  Diagnosis Date  . Arthropathy of cervical facet joint    C2-3  . At risk for falls   . BPV (benign positional vertigo)   . Cancer ([place]) 2004    breast-lumpectomy,nodes ,radiation,chemo  . Chronic neck pain   . CKD (chronic kidney disease), stage II   . Diverticulosis of colon   . Elevated troponin    a. 08/2016 in setting of PAF;  b. 08/2016 low risk MV w/ fixed septal defect (LBBB), EF 53%.  Marland Kitchen GERD (gastroesophageal reflux disease)   . Hiatal hernia   . History of aortic valve stenosis   . History of breast cancer per pt no recurrence   dx 2004-- Left breast DCIS (ER/PR+, HER2 negative) s/p  partial masectomy w/ sln bx and  chemoradiotion (completed 2004)  . History of colon polyps   . History of herpes zoster   . History of kidney stones   . HTN (hypertension)   . Hyperlipidemia   . Hypertrophy of inter-atrial  septum    a. 09/2014 Echo: ? of atrial mass; b. 10/2014 Cardiac MRI: no atrial septal mass - polypoid lipomatous hypertrophy of superior atrial septum noted-->Benign.  . Irritable bowel syndrome   . LBBB (left bundle branch block)   . PAF (paroxysmal atrial fibrillation) (HCC)    a. CHA2DS2VASc = 4-->coumadin.  . Parkinsonism [facility])    neurologist-  dr tat  . Personal history of chemotherapy 2004  . Personal history of radiation therapy 2004  . PONV (postoperative nausea and vomiting)   . PVC's (premature ventricular contractions)   . Right ureteral stone   . S/P aortic valve replacement with prosthetic valve    a. 04/13/2006 s/p St Jude mechnical AVR for severe AS;  b. 09/2014 Echo: EF 40-45%, gr1 DD, mild AI/MR, triv TR/PR.  . SUI (stress urinary incontinence, female)   . Symptomatic anemia   . Venous varices      Patient Active Problem List   Diagnosis Date Noted  . Atrial flutter ([place])   . Heme positive stool   . Symptomatic anemia 12/05/2018  . GI bleed 12/05/2018  . CKD (chronic kidney disease), stage III ([place]) 12/05/2018  . Elevated d-dimer 12/05/2018  . SOB (shortness of breath) 12/05/2018  . Supratherapeutic INR 12/05/2018  . Parkinsonism ([place])   . Elevated troponin 09/19/2016  . Emesis 09/19/2016  .  UTI (urinary tract infection) 09/19/2016  . Sinus bradycardia 09/16/2016  . Family history of colon cancer 07/30/2014  . Hiatal hernia 07/30/2014  . Chest pain 10/13/2011  . Disequilibrium 10/13/2011  . Heart valve replaced by other means 04/16/2011  . Chronic anticoagulation 03/17/2011  . S/P AVR (aortic valve replacement)   . PVC's (premature ventricular contractions)   . HTN (hypertension)   . Hypercholesteremia   . Atrial fibrillation with RVR ([place])   . GERD (gastroesophageal reflux disease)   . GERD 02/06/2010  . DIVERTICULOSIS-COLON 02/06/2010  . PERSONAL HX COLONIC POLYPS 02/06/2010  . MASTECTOMY, HX OF 02/06/2010    Past Surgical History:  Procedure  Laterality Date  . ANTERIOR AND POSTERIOR REPAIR  1990   cystocele/ rectocele  . AORTIC VALVE REPLACEMENT  04/13/06   dr gerhardt   and Replacement Ascending Aorta (St. Jude mechanical prosthesis)  . APPENDECTOMY  child 1950  . BREAST LUMPECTOMY Left 01/11/2003   malignant  . BREAST SURGERY Left 2004  . CARDIAC CATHETERIZATION  02-22-2001   dr Martinique   minimal non-obstructive cad/  mild aortic stenosis and aortic root enlargement/  normal LVF, ef 65%  . CARDIAC CATHETERIZATION  03-22-2006    dr Martinique   no significant obstructive cad/  severe aortic stenosis and mild to moderate aortic root enlargement/  upper normal right heart pressures  . CARDIOVASCULAR STRESS TEST  09-30-2011   dr Martinique   lexiscan nuclear study w/ apical thinning  vs  small prior apical infarct w/ no significant ischemia/  hypokinesis of the distal septum and apex, ef 50%  . CARPAL TUNNEL RELEASE Bilateral left 09-09-2004/  right 2007  . CATARACT EXTRACTION W/ INTRAOCULAR LENS  IMPLANT, BILATERAL  2015  . COLONOSCOPY N/A 10/03/2014   Procedure: COLONOSCOPY;  Surgeon: [name], MD;  Location: [facility];  Service: Endoscopy;  Laterality: N/A;  . CYSTOSCOPY WITH RETROGRADE PYELOGRAM, URETEROSCOPY AND STENT PLACEMENT Right 09/17/2016   Procedure: CYSTOSCOPY WITH RIGHT RETROGRADE PYELOGRAM, RIGHT URETEROSCOPY AND STENT PLACEMENT LASER LITHOTRIPSY, RIGHT STENT PLACEMENT;  Surgeon: [name], MD;  Location: [facility];  Service: Urology;  Laterality: Right;  . ESOPHAGEAL MANOMETRY N/A 08/12/2014   Procedure: ESOPHAGEAL MANOMETRY (EM);  Surgeon: [name], MD;  Location: [facility] END[facility];  Service: Endoscopy;  Laterality: N/A;  . ESOPHAGOGASTRODUODENOSCOPY N/A 10/03/2014   Procedure: ESOPHAGOGASTRODUODENOSCOPY (EGD);  Surgeon: [name], MD;  Location: [facility] ENDOSCOPY;  Service: Endoscopy;  Laterality: N/A;  . ESOPHAGOGASTRODUODENOSCOPY N/A 12/08/2018   Procedure: ESOPHAGOGASTRODUODENOSCOPY  (EGD);  Surgeon: Doran Stabler, MD;  Location: [facility];  Service: Gastroenterology;  Laterality: N/A;  . [place]  . FOOT SURGERY Right 2013   hammertoe x2  . HOLMIUM LASER APPLICATION Right 2/54/2706   Procedure: HOLMIUM LASER APPLICATION;  Surgeon: [name], MD;  Location: [facility];  Service: Urology;  Laterality: Right;  . PERCUTANEOUS NEPHROSTOLITHOTOMY  1993  . STONE EXTRACTION WITH BASKET Right 09/17/2016   Procedure: STONE EXTRACTION WITH BASKET;  Surgeon: [name], MD;  Location: [facility];  Service: Urology;  Laterality: Right;  . TOTAL ABDOMINAL HYSTERECTOMY  1971   w/ Bilateral Salpingoophorectomy  . TRANSTHORACIC ECHOCARDIOGRAM  10/24/2014   dr Martinique   hypokinesis of the basal and mid inferoseptal and inferior walls,  ef 40-45%,  grade 1 diastolic dysfunction/  mechanical bileaflet aortic valve noted w/ mild regur., peak grandient 17mHg, valve area 1.35cm^2/  severe MV calcification  without stenosis w/ mild regurg., peak gradient 63mHg/  mild LAE/  poss. atrial mass (MRI showed benign lipomatous hypertrophy of atrial septum) /Frazier ButtPR/mild TR     OB History   No obstetric history on file.      Home Medications    Prior to Admission medications   Medication Sig Start Date End Date Taking? Authorizing Provider  acetaminophen (TYLENOL) 500 MG tablet Take 1,000 mg by mouth every 6 (six) hours as needed (head pain).     [provider]  amiodarone (PACERONE) 200 MG tablet Take 1 tablet (200 mg total) by mouth daily. 01/01/19   SPatsey Berthold NP  calcium carbonate (TUMS - DOSED IN MG ELEMENTAL CALCIUM) 500 MG chewable tablet Chew 1 tablet by mouth daily.    [provider]  calcium citrate-vitamin D (CITRACAL+D) 315-200 MG-UNIT per tablet Take 1 tablet by mouth 3 (three) times daily.    [provider]  carbidopa-levodopa (SINEMET IR) 25-100 MG tablet  TAKE 2 TABLETS BY MOUTH THREE TIMES DAILY Patient taking differently: Take 2 tablets by mouth 3 (three) times daily.  10/02/18   Tat, REustace Quail DO  Cholecalciferol (VITAMIN D3) 2000 units TABS Take 1 capsule by mouth daily.    [provider]  famotidine-calcium carbonate-magnesium hydroxide (PEPCID COMPLETE) 10-800-165 MG CHEW chewable tablet Chew 1 tablet by mouth daily as needed (heartburn).    [provider]  KLOR-CON M20 20 MEQ tablet Take 20 mEq by mouth daily. 05/26/17   [provider]  KRILL OIL PO Take 1 capsule by mouth daily.     [provider]  Melatonin 3 MG TABS Take 3 mg by mouth at bedtime as needed (sleep).     [provider]  metoprolol tartrate (LOPRESSOR) 25 MG tablet Take 1 tablet (25 mg total) by mouth daily as needed (if heart rate is above 110). 12/14/18 03/14/19  Barrett, REvelene Croon PA-C  Multiple Vitamin (MULTIVITAMIN) tablet Take 1 tablet by mouth daily at 12 noon.     [provider]  Multiple Vitamins-Minerals (VISION-VITE PRESERVE PO) Take 1 tablet by mouth 2 (two) times daily.    [provider]  pantoprazole (PROTONIX) 40 MG tablet Take 40 mg by mouth daily. 06/07/17   [provider]  Probiotic Product (PROBIOTIC-10 PO) Take 1 capsule by mouth daily.     [provider]  simvastatin (ZOCOR) 20 MG tablet Take 20 mg by mouth every evening.    [provider]  vitamin B-12 (CYANOCOBALAMIN) 1000 MCG tablet Take 1,000 mcg by mouth every morning.    [provider]  warfarin (COUMADIN) 5 MG tablet Take 5 mg by mouth. Take 7.5 mg by mouth every Monday, Wednesday, Friday. Take 5 mg by mouth all other days.    [provider]    Family History Family History  Problem Relation Age of Onset  . Heart disease Mother   . Heart disease Father 769      CABG  . Other Brother        AVR    Social History Social History   Tobacco Use  . Smoking status: Never Smoker    . Smokeless tobacco: Never Used  Substance Use Topics  . Alcohol use: No    Alcohol/week: 0.0 standard drinks  . Drug use: No     Allergies   Demerol [meperidine]; Oxycodone; Adhesive [tape]; Ivp dye [iodinated diagnostic agents]; and Phenergan [promethazine]   Review of Systems Review of Systems  Skin: Positive for wound.  Hematological: Bruises/bleeds easily.     Physical Exam Updated Vital Signs BP (!) 156/70   Pulse 64   Temp 98.9 F (37.2 C) (Oral)   Resp 18   Ht _0  (1.499 m)   Wt 64.4 kg   SpO2 98%   BMI 28.68 kg/m   Physical Exam Vitals signs and nursing note reviewed.  Constitutional:      General: She is not in acute distress.    Appearance: She is well-developed.     Comments: Elderly female resting comfortably in the bed in no acute distress  HENT:     Head: Normocephalic.      Comments: superficial, 0.75 laceration to the occiput without active bleeding.  No underlying hematoma.  No underlying tenderness or crepitus of the scalp.  No injury noted elsewhere on the head.  No hemotympanum or nasal septal hematoma.  No malocclusion. Eyes:     Conjunctiva/sclera: Conjunctivae normal.     Pupils: Pupils are equal, round, and reactive to light.  Neck:     Musculoskeletal: Normal range of motion and neck supple.     Comments: No tenderness palpation midline C-spine.  No obvious step-offs. Cardiovascular:     Rate and Rhythm: Normal rate.     Pulses: Normal pulses.  Pulmonary:     Effort: Pulmonary effort is normal. No respiratory distress.     Breath sounds: Normal breath sounds. No wheezing.  Abdominal:     General: There is no distension.     Palpations: Abdomen is soft. There is no mass.     Tenderness: There is no abdominal tenderness. There is no guarding or rebound.  Musculoskeletal: Normal range of motion.        General: No tenderness.     Comments: Strength intact x4.  Sensation intact x4.  Radial pedal pulses intact bilaterally.  No  obvious injury or deformity. No tenderness palpation of back or midline spine.  Skin:    General: Skin is warm and dry.     Capillary Refill: Capillary refill takes less than 2 seconds.  Neurological:     General: No focal deficit present.     Mental Status: She is alert and oriented to person, place, and time.     Cranial Nerves: No cranial nerve deficit.     Sensory: No sensory deficit.     Motor: No weakness.     Comments: No obvious neurologic deficits.  CN intact.  Nose to finger intact.  Fine movement and coordination intact.      ED Treatments / Results  Labs (all labs ordered are listed, but only abnormal results are displayed) Labs Reviewed  CBC - Abnormal; Notable for the following components:      Result Value   RBC 3.72 (*)    Hemoglobin 11.9 (*)    MCV 103.0 (*)    RDW 16.2 (*)    All other components within normal limits  PROTIME-INR - Abnormal; Notable for the following components:   Prothrombin Time 18.2 (*)    All other components within normal limits  BASIC METABOLIC PANEL - Abnormal; Notable for the following components:   BUN 25 (*)    Creatinine, Ser 1.39 (*)    GFR calc non Af Amer 36 (*)    GFR calc Af Amer 42 (*)    All other components within normal limits  APTT    EKG None  Radiology Ct Head Wo Contrast  Result Date: 01/07/2019  CLINICAL DATA:  Fall.  Occipital laceration.  On Coumadin. EXAM: CT HEAD WITHOUT CONTRAST CT CERVICAL SPINE WITHOUT CONTRAST TECHNIQUE: Multidetector CT imaging of the head and cervical spine was performed following the standard protocol without intravenous contrast. Multiplanar CT image reconstructions of the cervical spine were also generated. COMPARISON:  CT head and cervical spine dated February 12, 2018. FINDINGS: CT HEAD FINDINGS Brain: No evidence of acute infarction, hemorrhage, hydrocephalus, extra-axial collection or mass lesion/mass effect. Apparent extra-axial hyperdensity in the right anterior middle cranial  fossa on the axial images (series 2, image 8) represents volume averaging with the adjacent skull base, as there is no corresponding extra-axial abnormality on the coronal or sagittal reformats. Stable mild atrophy and chronic microvascular ischemic changes. Vascular: Calcified atherosclerosis at the skullbase. No hyperdense vessel. Skull: Negative for fracture or focal lesion. Sinuses/Orbits: No acute finding. Other: None. CT CERVICAL SPINE FINDINGS Alignment: Slight reversal of the normal cervical lordosis, apex a C6. No traumatic malalignment. Skull base and vertebrae: No acute fracture. No primary bone lesion or focal pathologic process. 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 Soft tissues and spinal canal: No prevertebral fluid or swelling. No visible canal hematoma. Disc levels: Unchanged moderate degenerative disc disease at C5-C6 and C6-C7. Mild facet arthropathy throughout the cervical spine. Upper chest: Negative. Other: None. IMPRESSION: 1.  No acute intracranial abnormality. 2.  No acute cervical spine fracture. Electronically Signed   By: Titus Dubin M.D.   On: 01/07/2019 14:18   Ct Cervical Spine Wo Contrast  Result Date: 01/07/2019 CLINICAL DATA:  Fall.  Occipital laceration.  On Coumadin. EXAM: CT HEAD WITHOUT CONTRAST CT CERVICAL SPINE WITHOUT CONTRAST TECHNIQUE: Multidetector CT imaging of the head and cervical spine was performed following the standard protocol without intravenous contrast. Multiplanar CT image reconstructions of the cervical spine were also generated. COMPARISON:  CT head and cervical spine dated February 12, 2018. FINDINGS: CT HEAD FINDINGS Brain: No evidence of acute infarction, hemorrhage, hydrocephalus, extra-axial collection or mass lesion/mass effect. Apparent extra-axial hyperdensity in the right anterior middle cranial fossa on the axial images (series 2, image 8) represents volume averaging with the adjacent skull base, as there is no  corresponding extra-axial abnormality on the coronal or sagittal reformats. Stable mild atrophy and chronic microvascular ischemic changes. Vascular: Calcified atherosclerosis at the skullbase. No hyperdense vessel. Skull: Negative for fracture or focal lesion. Sinuses/Orbits: No acute finding. Other: None. CT CERVICAL SPINE FINDINGS Alignment: Slight reversal of the normal cervical lordosis, apex a C6. No traumatic malalignment. Skull base and vertebrae: No acute fracture. No primary bone lesion or focal pathologic process. 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 Soft tissues and spinal canal: No prevertebral fluid or swelling. No visible canal hematoma. Disc levels: Unchanged moderate degenerative disc disease at C5-C6 and C6-C7. Mild facet arthropathy throughout the cervical spine. Upper chest: Negative. Other: None. IMPRESSION: 1.  No acute intracranial abnormality. 2.  No acute cervical spine fracture. Electronically Signed   By: Titus Dubin M.D.   On: 01/07/2019 14:18    Procedures Procedures (including critical care time)  Medications Ordered in ED Medications - No data to display   Initial Impression / Assessment and Plan / ED Course  I have reviewed the triage vital signs and the nursing notes.  Pertinent labs & imaging results that were available during my care of the patient were reviewed by me and considered in my medical decision making (see chart for details).  Pt presenting for evaluation after fall.  Physical exam reassuring, no obvious neurologic deficits.  Small, superficial laceration without active bleeding.  As patient is on blood thinners, will obtain CT head and neck for further evaluation.  Will obtain blood work to check hemoglobin status and INR.  Exam however, is reassuring.  Imaging without signs of bleed, fracture, or other acute concerning injury.  Laceration does not require repair at this time, discussed wound care.  Patient able to  ambulate at her baseline.  Case discussed with attending, Dr. Winfred Leeds evaluated the patient.  INR subtherapeutic, although patient has decreased her dose of Coumadin, will return to her full dose tonight.  As such, will not increase her dose, and instead have her follow-up with her doctor on Thursday for recheck of her INR.  Strict return precautions given, including signs of head bleed or injury.  At this time, patient appears safe for discharge.  Return precautions given.  Patient states she understands and agrees plan.   Final Clinical Impressions(s) / ED Diagnoses   Final diagnoses:  Injury of head, initial encounter  Subtherapeutic anticoagulation    ED Discharge Orders    None       Franchot Heidelberg, PA-C 01/07/19 1611    Orlie Dakin, MD 01/08/19 0025

## 2019-01-07 NOTE — ED Triage Notes (Signed)
Presents with head injury, pt fell backwards and hit her head on the dresser and landed on the floor. She has a 3cm laceration to the occiput of her head. She takes coumadin and at her last check she had a high lever of 4.1. She endorses headache and feeling a little woozy. Denies nausea. Took 550 mg acetaminophen prior to arrival.

## 2019-01-07 NOTE — ED Notes (Signed)
1st attempt to get labs unsuccessful.

## 2019-01-11 DIAGNOSIS — I358 Other nonrheumatic aortic valve disorders: Secondary | ICD-10-CM | POA: Diagnosis not present

## 2019-01-11 DIAGNOSIS — Z7901 Long term (current) use of anticoagulants: Secondary | ICD-10-CM | POA: Diagnosis not present

## 2019-01-11 DIAGNOSIS — I4891 Unspecified atrial fibrillation: Secondary | ICD-10-CM | POA: Diagnosis not present

## 2019-01-15 ENCOUNTER — Telehealth: Payer: Self-pay | Admitting: Cardiology

## 2019-01-15 MED ORDER — METOPROLOL TARTRATE 25 MG PO TABS: 25.0000 mg | ORAL_TABLET | Freq: Every day | ORAL | 3 refills | Status: DC | PRN

## 2019-01-15 NOTE — Telephone Encounter (Signed)
New message    Pt husband stated that metoprolol tartrate (LOPRESSOR) 25 MG tablet was never sent to [facility] [place]. They tried to pick up and had no record. Please follow up

## 2019-01-15 NOTE — Telephone Encounter (Signed)
Returned call to patient metoprolol refill sent to pharmacy.

## 2019-01-25 DIAGNOSIS — I4891 Unspecified atrial fibrillation: Secondary | ICD-10-CM | POA: Diagnosis not present

## 2019-01-25 DIAGNOSIS — Z7901 Long term (current) use of anticoagulants: Secondary | ICD-10-CM | POA: Diagnosis not present

## 2019-01-25 DIAGNOSIS — I358 Other nonrheumatic aortic valve disorders: Secondary | ICD-10-CM | POA: Diagnosis not present

## 2019-02-06 DIAGNOSIS — N819 Female genital prolapse, unspecified: Secondary | ICD-10-CM | POA: Diagnosis not present

## 2019-02-06 DIAGNOSIS — N3281 Overactive bladder: Secondary | ICD-10-CM | POA: Diagnosis not present

## 2019-02-06 DIAGNOSIS — Z01419 Encounter for gynecological examination (general) (routine) without abnormal findings: Secondary | ICD-10-CM | POA: Diagnosis not present

## 2019-02-06 DIAGNOSIS — Z6827 Body mass index (BMI) 27.0-27.9, adult: Secondary | ICD-10-CM | POA: Diagnosis not present

## 2019-02-27 ENCOUNTER — Ambulatory Visit: Payer: PPO | Admitting: Internal Medicine

## 2019-02-27 ENCOUNTER — Encounter: Payer: Self-pay | Admitting: Internal Medicine

## 2019-02-27 DIAGNOSIS — Z952 Presence of prosthetic heart valve: Secondary | ICD-10-CM | POA: Diagnosis not present

## 2019-02-27 DIAGNOSIS — I495 Sick sinus syndrome: Secondary | ICD-10-CM | POA: Diagnosis not present

## 2019-02-27 DIAGNOSIS — I480 Paroxysmal atrial fibrillation: Secondary | ICD-10-CM | POA: Diagnosis not present

## 2019-02-27 NOTE — Progress Notes (Signed)
HPI [name] returns today for followup of her PAF, LBBB, s/p AVR, with a h/o LBBB, Parkinsons like syndrome, and HTN. In the interim, she appears to be maintaining NSR. She denies chest pain or sob. She has been checking her bp and HR on a regular basis. She has not had any symptomatic atrial fib.  Allergies  Allergen Reactions  . Demerol [Meperidine] Shortness Of Breath and Swelling  . Oxycodone Other (See Comments)    Hallucinations   . Adhesive [Tape] Other (See Comments)    Redness and skin tears  . Ivp Dye [Iodinated Diagnostic Agents] Hives    OK with benadryl pre-med (60m one hour before receiving iodinated contrast agent)  . Phenergan [Promethazine] Other (See Comments)    Avoids due to reaction with current medications     Current Outpatient Medications  Medication Sig Dispense Refill  . acetaminophen (TYLENOL) 500 MG tablet Take 1,000 mg by mouth every 6 (six) hours as needed (head pain).     .Marland Kitchenamiodarone (PACERONE) 200 MG tablet Take 1 tablet (200 mg total) by mouth daily. 90 tablet 1  . calcium carbonate (TUMS - DOSED IN MG ELEMENTAL CALCIUM) 500 MG chewable tablet Chew 1 tablet by mouth daily.    . calcium citrate-vitamin D (CITRACAL+D) 315-200 MG-UNIT per tablet Take 1 tablet by mouth 3 (three) times daily.    . carbidopa-levodopa (SINEMET IR) 25-100 MG tablet TAKE 2 TABLETS BY MOUTH THREE TIMES DAILY (Patient taking differently: Take 2 tablets by mouth 3 (three) times daily. ) 540 tablet 1  . Cholecalciferol (VITAMIN D3) 2000 units TABS Take 1 capsule by mouth daily.    . famotidine-calcium carbonate-magnesium hydroxide (PEPCID COMPLETE) 10-800-165 MG CHEW chewable tablet Chew 1 tablet by mouth daily as needed (heartburn).    .Marland KitchenKLOR-CON M20 20 MEQ tablet Take 20 mEq by mouth daily.    .Marland KitchenKRILL OIL PO Take 1 capsule by mouth daily.     . Melatonin 3 MG TABS Take 3 mg by mouth at bedtime as needed (sleep).     . metoprolol tartrate (LOPRESSOR) 25 MG tablet Take 1  tablet (25 mg total) by mouth daily as needed (if heart rate is above 110). 90 tablet 3  . Multiple Vitamin (MULTIVITAMIN) tablet Take 1 tablet by mouth daily at 12 noon.     . Multiple Vitamins-Minerals (VISION-VITE PRESERVE PO) Take 1 tablet by mouth 2 (two) times daily.    . pantoprazole (PROTONIX) 40 MG tablet Take 40 mg by mouth daily.    . Probiotic Product (PROBIOTIC-10 PO) Take 1 capsule by mouth daily.     . simvastatin (ZOCOR) 20 MG tablet Take 20 mg by mouth every evening.    . vitamin B-12 (CYANOCOBALAMIN) 1000 MCG tablet Take 1,000 mcg by mouth every morning.    . warfarin (COUMADIN) 5 MG tablet Take 5 mg by mouth. Take 7.5 mg by mouth every Monday, Wednesday, Friday. Take 5 mg by mouth all other days.     No current facility-administered medications for this visit.      Past Medical History:  Diagnosis Date  . Arthropathy of cervical facet joint    C2-3  . At risk for falls   . BPV (benign positional vertigo)   . Cancer ([place] 2004    breast-lumpectomy,nodes ,radiation,chemo  . Chronic neck pain   . CKD (chronic kidney disease), stage II   . Diverticulosis of colon   . Elevated troponin    a.  08/2016 in setting of PAF;  b. 08/2016 low risk MV w/ fixed septal defect (LBBB), EF 53%.  Marland Kitchen GERD (gastroesophageal reflux disease)   . Hiatal hernia   . History of aortic valve stenosis   . History of breast cancer per pt no recurrence   dx 2004-- Left breast DCIS (ER/PR+, HER2 negative) s/p  partial masectomy w/ sln bx and  chemoradiotion (completed 2004)  . History of colon polyps   . History of herpes zoster   . History of kidney stones   . HTN (hypertension)   . Hyperlipidemia   . Hypertrophy of inter-atrial septum    a. 09/2014 Echo: ? of atrial mass; b. 10/2014 Cardiac MRI: no atrial septal mass - polypoid lipomatous hypertrophy of superior atrial septum noted-->Benign.  . Irritable bowel syndrome   . LBBB (left bundle branch block)   . PAF (paroxysmal atrial  fibrillation) (HCC)    a. CHA2DS2VASc = 4-->coumadin.  . Parkinsonism [facility])    neurologist-  dr tat  . Personal history of chemotherapy 2004  . Personal history of radiation therapy 2004  . PONV (postoperative nausea and vomiting)   . PVC's (premature ventricular contractions)   . Right ureteral stone   . S/P aortic valve replacement with prosthetic valve    a. 04/13/2006 s/p St Jude mechnical AVR for severe AS;  b. 09/2014 Echo: EF 40-45%, gr1 DD, mild AI/MR, triv TR/PR.  . SUI (stress urinary incontinence, female)   . Symptomatic anemia   . Venous varices      ROS:   All systems reviewed and negative except as noted in the HPI.   Past Surgical History:  Procedure Laterality Date  . ANTERIOR AND POSTERIOR REPAIR  1990   cystocele/ rectocele  . AORTIC VALVE REPLACEMENT  04/13/06   dr gerhardt   and Replacement Ascending Aorta (St. Jude mechanical prosthesis)  . APPENDECTOMY  child 1950  . BREAST LUMPECTOMY Left 01/11/2003   malignant  . BREAST SURGERY Left 2004  . CARDIAC CATHETERIZATION  02-22-2001   dr Martinique   minimal non-obstructive cad/  mild aortic stenosis and aortic root enlargement/  normal LVF, ef 65%  . CARDIAC CATHETERIZATION  03-22-2006    dr Martinique   no significant obstructive cad/  severe aortic stenosis and mild to moderate aortic root enlargement/  upper normal right heart pressures  . CARDIOVASCULAR STRESS TEST  09-30-2011   dr Martinique   lexiscan nuclear study w/ apical thinning  vs  small prior apical infarct w/ no significant ischemia/  hypokinesis of the distal septum and apex, ef 50%  . CARPAL TUNNEL RELEASE Bilateral left 09-09-2004/  right 2007  . CATARACT EXTRACTION W/ INTRAOCULAR LENS  IMPLANT, BILATERAL  2015  . COLONOSCOPY N/A 10/03/2014   Procedure: COLONOSCOPY;  Surgeon: [name], MD;  Location: [place];  Service: Endoscopy;  Laterality: N/A;  . CYSTOSCOPY WITH RETROGRADE PYELOGRAM, URETEROSCOPY AND STENT PLACEMENT Right 09/17/2016    Procedure: CYSTOSCOPY WITH RIGHT RETROGRADE PYELOGRAM, RIGHT URETEROSCOPY AND STENT PLACEMENT LASER LITHOTRIPSY, RIGHT STENT PLACEMENT;  Surgeon: [name], MD;  Location: [facility];  Service: Urology;  Laterality: Right;  . ESOPHAGEAL MANOMETRY N/A 08/12/2014   Procedure: ESOPHAGEAL MANOMETRY (EM);  Surgeon: [name], MD;  Location: [facility];  Service: Endoscopy;  Laterality: N/A;  . ESOPHAGOGASTRODUODENOSCOPY N/A 10/03/2014   Procedure: ESOPHAGOGASTRODUODENOSCOPY (EGD);  Surgeon: [name], MD;  Location: [facility];  Service: Endoscopy;  Laterality: N/A;  . ESOPHAGOGASTRODUODENOSCOPY N/A 12/08/2018  Procedure: ESOPHAGOGASTRODUODENOSCOPY (EGD);  Surgeon: Doran Stabler, MD;  Location: [place];  Service: Gastroenterology;  Laterality: N/A;  . [place]  . FOOT SURGERY Right 2013   hammertoe x2  . HOLMIUM LASER APPLICATION Right 04/22/622   Procedure: HOLMIUM LASER APPLICATION;  Surgeon: [name], MD;  [facility];  Service: Urology;  Laterality: Right;  . PERCUTANEOUS NEPHROSTOLITHOTOMY  1993  . STONE EXTRACTION WITH BASKET Right 09/17/2016   Procedure: STONE EXTRACTION WITH BASKET;  Surgeon: [name], MD;  Location: [facility];  Service: Urology;  Laterality: Right;  . TOTAL ABDOMINAL HYSTERECTOMY  1971   w/ Bilateral Salpingoophorectomy  . TRANSTHORACIC ECHOCARDIOGRAM  10/24/2014   dr Martinique   hypokinesis of the basal and mid inferoseptal and inferior walls,  ef 40-45%,  grade 1 diastolic dysfunction/  mechanical bileaflet aortic valve noted w/ mild regur., peak grandient 77mHg, valve area 1.35cm^2/  severe MV calcification without stenosis w/ mild regurg., peak gradient 358mg/  mild LAE/  poss. atrial mass (MRI showed benign lipomatous hypertrophy of atrial septum) /tFrazier ButtR/mild TR     Family History  Problem Relation Age of Onset  . Heart  disease Mother   . Heart disease Father [id number]     CABG  . Other Brother        AVR     Social History   Socioeconomic History  . Marital status: Married    Spouse name: Not on file  . Number of children: 3  . Years of education: 10  . Highest education level: Not on file  Occupational History  . 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 Financial resource strain: Not on file  . Food insecurity:    Worry: Not on file    Inability: Not on file  . Transportation needs:    Medical: Not on file    Non-medical: Not on file  Tobacco Use  . Smoking status: Never Smoker  . Smokeless tobacco: Never Used  Substance and Sexual Activity  . Alcohol use: No    Alcohol/week: 0.0 standard drinks  . Drug use: No  . Sexual activity: Not on file  Lifestyle  . Physical activity:    Days per week: Not on file    Minutes per session: Not on file  . Stress: Not on file  Relationships  . Social connections:    Talks on phone: Not on file    Gets together: Not on file    Attends religious service: Not on file    Active member of club or organization: Not on file    Attends meetings of clubs or organizations: Not on file    Relationship status: Not on file  . Intimate partner violence:    Fear of current or ex partner: Not on file    Emotionally abused: Not on file    Physically abused: Not on file    Forced sexual activity: Not on file  Other Topics Concern  . Not on file  Social History Narrative      1/2 -1 soda a day       BP - 134/78, P - 64, R - 18, Wt. 136.4 lbs Physical Exam:  Well appearing NAD HEENT: Unremarkable Neck:  No JVD, no thyromegally Lymphatics:  No adenopathy Back:  No CVA tenderness Lungs:  Clear with no wheezes HEART:  Regular rate  rhythm, no murmurs, no rubs, no clicks Abd:  soft, positive bowel sounds, no organomegally, no rebound, no guarding Ext:  2 plus pulses, no edema, no cyanosis, no clubbing Skin:   No rashes no nodules Neuro:  CN II through XII intact, motor grossly intact  EKG - nsr with lbbb   Assess/Plan: 1. PAF - she is maintaining NSR on low dose amiodarone. She will continue with her current meds. 2. HTN - her blood pressure log has been reviewed and she is in the 130-140 mostly.  3. Coags - she will continue warfarin. She has reduced her dose on amiodarone.  Mikle Bosworth.D.

## 2019-02-27 NOTE — Patient Instructions (Addendum)

## 2019-03-01 DIAGNOSIS — Z7901 Long term (current) use of anticoagulants: Secondary | ICD-10-CM | POA: Diagnosis not present

## 2019-03-01 DIAGNOSIS — I358 Other nonrheumatic aortic valve disorders: Secondary | ICD-10-CM | POA: Diagnosis not present

## 2019-03-01 DIAGNOSIS — I4891 Unspecified atrial fibrillation: Secondary | ICD-10-CM | POA: Diagnosis not present

## 2019-03-24 ENCOUNTER — Other Ambulatory Visit: Payer: Self-pay | Admitting: Neurology

## 2019-04-03 DIAGNOSIS — Z7901 Long term (current) use of anticoagulants: Secondary | ICD-10-CM | POA: Diagnosis not present

## 2019-04-03 DIAGNOSIS — I358 Other nonrheumatic aortic valve disorders: Secondary | ICD-10-CM | POA: Diagnosis not present

## 2019-04-03 DIAGNOSIS — I4891 Unspecified atrial fibrillation: Secondary | ICD-10-CM | POA: Diagnosis not present

## 2019-04-13 NOTE — Progress Notes (Signed)
Virtual Visit via Telephone Note The purpose of this virtual visit is to provide medical care while limiting exposure to the novel coronavirus.    Consent was obtained for phone visit:  Yes.   Answered questions that patient had about telehealth interaction:  Yes.   I discussed the limitations, risks, security and privacy concerns of performing an evaluation and management service by telephone. I also discussed with the patient that there may be a patient responsible charge related to this service. The patient expressed understanding and agreed to proceed.  Pt location: Home Physician Location: office Name of referring provider:  Leanna Battles, MD I connected with .[name] at patients initiation/request on 04/17/2019 at 10:30 AM EDT by telephone and verified that I am speaking with the correct person using two identifiers.  Pt MRN:  [id number] Pt DOB:  May 18, 1941  Participants:  [name]; husband   History of Present Illness:  Patient seen today in follow-up for PSP.  Husband supplies most of the history.  She is on carbidopa/levodopa 25/100, 2 tablets 3 times per day.  Has refused any physical therapy.  Records are reviewed since last visit.  She was admitted to the hospital in December with chest pain, possible upper GI bleed and atrial fibrillation with rapid ventricular response.  Her Coumadin was resumed during hospitalization after it was felt that GI bleed was stabilized.  She had an EGD on December 13 without any major focus of bleeding.  She was significantly anemic on admission.  It was recommended that she have outpatient physical therapy/home health.  She went to the emergency room in January after a fall.  She was reaching around to get her walker and struck her head, creating a small laceration on her scalp.  No repair was required.  She had a CT of the brain on January 07, 2019.  I personally reviewed this.  It was nonacute.  She was seen by  cardiology in the office on February 27, 2019.  She was maintaining normal sinus rhythm and was not in A. fib at the time.  She is on low-dose amiodarone.  Patient did have a modified barium swallow since her last visit.  This was June 23, 2018.  Oropharyngeal swallow was normal, with no evidence of aspiration.  She had intermittent cough, with one particular strong episode while scanning the esophagus, but no penetration or aspiration was observed.  No treatment was recommended.  Regular solids with thin liquids was recommended.  Pt reports she has had other falls besides ones went to the ER.  Some of them she is turning and others she had the walker near her but wasn't using it because in the bathroom and fell over the open bathroom vanity drawer.  Some falls related to turning.  Saw orthopedics at [facility] to make sure that no fx and there was not but he recommended an INR and it was elevated at 4.6 and that was when she ended up at the hospital with anemia.   Observations/Objective:   Unable-phone  Assessment and Plan:   1.  Probable PSP             -I do not think that the patient has idiopathic Parkinsons Disease but rather one of the atypical parkinsonian states, and possibly Progressive Supranuclear Palsy (PSP).  We talked about nature, etiology and pathophysiology. We talked about how the symptoms, course and prognosis differ from Parkinson's Disease.  We talked about the risks, particularly for  falls and aspiration.              -continue carbidopa/levodopa 25/100, 2 po tid.  They will let me know if she needs refill             -talked about PT.  She doesn't want to go right now.  She had eval in 03/2018 and felt didn't need it.               -told patient I want her to stay seated at all times unless directly holding onto husband.  She has had too many falls.  -talked about risk of falls while on coumadin.  Talked about risk benefit ratio and they are to continue that discussion with her  cardiologist.  The risk would certainly decline if she would stay seated, which is the current recommendation.    -husband asks about helmet given that she is on coumadin.  I haven't used those in PSP patients but certainly have in epilepsy patients and don't think unreasonable but would like to see her stay seated.  He asks me about resources to get this and I imagine [facility] but will research and get back to them.             -talked to her about advanced directives.   Neither pt/husband has these.  Talked about importance.   2.  Neck pain             -I think this is multifactorial.  She did have an MRI of the cervical spine on 06/03/2016 that demonstrated severe C2-C3 left facet arthropathy with joint effusion and bone marrow edema.  She has received injections for this.  She also has evidence of cervical dystonia, although this surprisingly has improved some with levodopa. Neck pain is better 3.  cough             -modified barium was done on June 23, 2018.  Oropharyngeal swallow was normal, with no evidence of aspiration.  She had intermittent cough, with one particular strong episode while scanning the esophagus, but no penetration or aspiration was observed.  No treatment was recommended.  Regular solids with thin liquids was recommended.  Generally, I would repeat this yearly but we agreed to hold this given covid and no increased symptoms right now.  Follow Up Instructions:    -I discussed the assessment and treatment plan with the patient. The patient was provided an opportunity to ask questions and all were answered. The patient agreed with the plan and demonstrated an understanding of the instructions.   The patient was advised to call back or seek an in-person evaluation if the symptoms worsen or if the condition fails to improve as anticipated.    Total Time spent in visit with the patient was:  21 min, of which 100% of the time was spent in counseling and/or coordinating care on safety,  as above.   Pt understands and agrees with the plan of care outlined.     Alonza Bogus, DO

## 2019-04-17 ENCOUNTER — Encounter: Payer: Self-pay | Admitting: General Surgery

## 2019-04-17 ENCOUNTER — Telehealth (INDEPENDENT_AMBULATORY_CARE_PROVIDER_SITE_OTHER): Payer: PPO | Admitting: Neurology

## 2019-04-17 ENCOUNTER — Other Ambulatory Visit: Payer: Self-pay

## 2019-04-17 ENCOUNTER — Encounter: Payer: Self-pay | Admitting: Neurology

## 2019-04-17 ENCOUNTER — Telehealth: Payer: Self-pay | Admitting: General Surgery

## 2019-04-17 DIAGNOSIS — M542 Cervicalgia: Secondary | ICD-10-CM

## 2019-04-17 DIAGNOSIS — G231 Progressive supranuclear ophthalmoplegia [Steele-Richardson-Olszewski]: Secondary | ICD-10-CM | POA: Diagnosis not present

## 2019-04-17 DIAGNOSIS — R05 Cough: Secondary | ICD-10-CM

## 2019-04-17 NOTE — Telephone Encounter (Signed)
Thank you.  Will you let husband know what you found out.  Tell him only thing that I know is what I know from epilepsy foundation and he can investigate those if he wishes.  You can send him these names in mail if helps him:  Headgear www.danmarproducts.com/ [place]. produces custom head protection, mainly hard-shell helmets, some with chin or face guards.  www.plument.com/ [employer]. produces fabric-covered foam protective headgear.

## 2019-04-17 NOTE — Telephone Encounter (Signed)
Contacted [facility] at [phone] spoke with [employer] and she states that they do not have helmets and they are not covered by Medicare. Then called [employer] they have changed there name to [employer] and they do not carry helmets or bill any insurance.

## 2019-04-17 NOTE — Progress Notes (Signed)
[name] [address] [place], [place] [address]  Dear [name]:  Per our telephone conversation this afternoon regarding helmets. Dr Tat has given me the following: These are two reputable companies that may be able to help you.  I have included the links for their websites. Www.denmarproducts.com and the other one is [email or web address]   If you have any questions please feel free to contact our office. Thank you in advance.  Respectfully,  Lanny Hurst, CMA (AAMA)

## 2019-04-17 NOTE — Telephone Encounter (Signed)
Contacted [name], advised her that we are unable to get her helmet covered or find a place where she could buy one. Explained to her that we have 2 places that that Dr Tat know from the  epilepsy foundation. I explained to the patient I would mail her the names of the 2 companies. She verbalized understanding.

## 2019-04-24 ENCOUNTER — Ambulatory Visit: Payer: PPO | Admitting: Neurology

## 2019-05-03 ENCOUNTER — Telehealth: Payer: Self-pay | Admitting: Cardiology

## 2019-05-03 DIAGNOSIS — I358 Other nonrheumatic aortic valve disorders: Secondary | ICD-10-CM | POA: Diagnosis not present

## 2019-05-03 DIAGNOSIS — Z7901 Long term (current) use of anticoagulants: Secondary | ICD-10-CM | POA: Diagnosis not present

## 2019-05-03 DIAGNOSIS — I4891 Unspecified atrial fibrillation: Secondary | ICD-10-CM | POA: Diagnosis not present

## 2019-05-03 NOTE — Telephone Encounter (Signed)
LVM regarding video or phone visit. °

## 2019-05-04 ENCOUNTER — Telehealth: Payer: Self-pay | Admitting: Cardiology

## 2019-05-04 NOTE — Telephone Encounter (Signed)
Patient wishes to wait until she can see Dr. Martinique.  She is not currently having any problems.

## 2019-05-08 ENCOUNTER — Telehealth: Payer: PPO | Admitting: Cardiology

## 2019-05-18 ENCOUNTER — Other Ambulatory Visit: Payer: Self-pay | Admitting: Internal Medicine

## 2019-05-18 DIAGNOSIS — Z1231 Encounter for screening mammogram for malignant neoplasm of breast: Secondary | ICD-10-CM

## 2019-06-05 DIAGNOSIS — I358 Other nonrheumatic aortic valve disorders: Secondary | ICD-10-CM | POA: Diagnosis not present

## 2019-06-05 DIAGNOSIS — R319 Hematuria, unspecified: Secondary | ICD-10-CM | POA: Diagnosis not present

## 2019-06-05 DIAGNOSIS — Z7901 Long term (current) use of anticoagulants: Secondary | ICD-10-CM | POA: Diagnosis not present

## 2019-06-05 DIAGNOSIS — I4891 Unspecified atrial fibrillation: Secondary | ICD-10-CM | POA: Diagnosis not present

## 2019-06-06 DIAGNOSIS — R319 Hematuria, unspecified: Secondary | ICD-10-CM | POA: Diagnosis not present

## 2019-06-11 DIAGNOSIS — R310 Gross hematuria: Secondary | ICD-10-CM | POA: Diagnosis not present

## 2019-06-12 DIAGNOSIS — I10 Essential (primary) hypertension: Secondary | ICD-10-CM | POA: Diagnosis not present

## 2019-06-12 DIAGNOSIS — M810 Age-related osteoporosis without current pathological fracture: Secondary | ICD-10-CM | POA: Diagnosis not present

## 2019-06-14 DIAGNOSIS — N200 Calculus of kidney: Secondary | ICD-10-CM | POA: Diagnosis not present

## 2019-06-14 DIAGNOSIS — R310 Gross hematuria: Secondary | ICD-10-CM | POA: Diagnosis not present

## 2019-06-15 ENCOUNTER — Ambulatory Visit
Admission: EM | Admit: 2019-06-15 | Discharge: 2019-06-15 | Disposition: A | Discharge status: Home / Self Care | Payer: PPO | Attending: Family Medicine | Admitting: Family Medicine

## 2019-06-15 ENCOUNTER — Other Ambulatory Visit: Payer: Self-pay

## 2019-06-15 DIAGNOSIS — I10 Essential (primary) hypertension: Secondary | ICD-10-CM | POA: Diagnosis not present

## 2019-06-15 DIAGNOSIS — S2241XA Multiple fractures of ribs, right side, initial encounter for closed fracture: Secondary | ICD-10-CM

## 2019-06-15 DIAGNOSIS — R82998 Other abnormal findings in urine: Secondary | ICD-10-CM | POA: Diagnosis not present

## 2019-06-15 MED ORDER — TRAMADOL HCL 50 MG PO TABS: 50.0000 mg | ORAL_TABLET | Freq: Four times a day (QID) | ORAL | 0 refills | Status: AC | PRN

## 2019-06-15 NOTE — ED Triage Notes (Signed)
Pt states fell yesterday prior to going to her CT scan appt. States was called that she has 4 rt fractured ribs.

## 2019-06-15 NOTE — Discharge Instructions (Addendum)
Treating your pain with tramadol and rib belt for comfort.  You can take the tylenol also as needed.  Make sure that you are doing deep breathing to prevent pneumonia.  Follow up as needed for continued or worsening symptoms

## 2019-06-17 NOTE — ED Provider Notes (Signed)
[place]    CSN: [id number] Arrival date & time: 06/15/19  1552     History   Chief Complaint Chief Complaint  Patient presents with  . Chest Pain    HPI [name] is a 78 y.o. female.   Pt is a 78 year old female that presents with right rib pain. Symptoms have been constant.  She has 4 known right rib fractures. She fell yesterday and had CT scan done. She took tylenol for pain with no relief. She is having a lot of pain with breathing and moving. No cough or SOB.   ROS per HPI      Past Medical History:  Diagnosis Date  . Arthropathy of cervical facet joint    C2-3  . At risk for falls   . BPV (benign positional vertigo)   . Cancer ([place]) 2004    breast-lumpectomy,nodes ,radiation,chemo  . Chronic neck pain   . CKD (chronic kidney disease), stage II   . Diverticulosis of colon   . Elevated troponin    a. 08/2016 in setting of PAF;  b. 08/2016 low risk MV w/ fixed septal defect (LBBB), EF 53%.  Marland Kitchen GERD (gastroesophageal reflux disease)   . Hiatal hernia   . History of aortic valve stenosis   . History of breast cancer per pt no recurrence   dx 2004-- Left breast DCIS (ER/PR+, HER2 negative) s/p  partial masectomy w/ sln bx and  chemoradiotion (completed 2004)  . History of colon polyps   . History of herpes zoster   . History of kidney stones   . HTN (hypertension)   . Hyperlipidemia   . Hypertrophy of inter-atrial septum    a. 09/2014 Echo: ? of atrial mass; b. 10/2014 Cardiac MRI: no atrial septal mass - polypoid lipomatous hypertrophy of superior atrial septum noted-->Benign.  . Irritable bowel syndrome   . LBBB (left bundle branch block)   . PAF (paroxysmal atrial fibrillation) (HCC)    a. CHA2DS2VASc = 4-->coumadin.  . Parkinsonism [facility])    neurologist-  dr tat  . Personal history of chemotherapy 2004  . Personal history of radiation therapy 2004  . PONV (postoperative nausea and vomiting)   . PVC's (premature ventricular  contractions)   . Right ureteral stone   . S/P aortic valve replacement with prosthetic valve    a. 04/13/2006 s/p St Jude mechnical AVR for severe AS;  b. 09/2014 Echo: EF 40-45%, gr1 DD, mild AI/MR, triv TR/PR.  . SUI (stress urinary incontinence, female)   . Symptomatic anemia   . Venous varices      Patient Active Problem List   Diagnosis Date Noted  . Atrial flutter ([place])   . Heme positive stool   . Symptomatic anemia 12/05/2018  . GI bleed 12/05/2018  . CKD (chronic kidney disease), stage III ([place]) 12/05/2018  . Elevated d-dimer 12/05/2018  . SOB (shortness of breath) 12/05/2018  . Supratherapeutic INR 12/05/2018  . Parkinsonism ([place])   . Elevated troponin 09/19/2016  . Emesis 09/19/2016  . UTI (urinary tract infection) 09/19/2016  . Sinus bradycardia 09/16/2016  . Family history of colon cancer 07/30/2014  . Hiatal hernia 07/30/2014  . Chest pain 10/13/2011  . Disequilibrium 10/13/2011  . Heart valve replaced by other means 04/16/2011  . Chronic anticoagulation 03/17/2011  . S/P AVR (aortic valve replacement)   . PVC's (premature ventricular contractions)   . HTN (hypertension)   . Hypercholesteremia   . Atrial fibrillation with RVR ([place])   .  GERD (gastroesophageal reflux disease)   . GERD 02/06/2010  . DIVERTICULOSIS-COLON 02/06/2010  . PERSONAL HX COLONIC POLYPS 02/06/2010  . MASTECTOMY, HX OF 02/06/2010    Past Surgical History:  Procedure Laterality Date  . ANTERIOR AND POSTERIOR REPAIR  1990   cystocele/ rectocele  . AORTIC VALVE REPLACEMENT  04/13/06   dr gerhardt   and Replacement Ascending Aorta (St. Jude mechanical prosthesis)  . APPENDECTOMY  child 1950  . BREAST LUMPECTOMY Left 01/11/2003   malignant  . BREAST SURGERY Left 2004  . CARDIAC CATHETERIZATION  02-22-2001   dr Martinique   minimal non-obstructive cad/  mild aortic stenosis and aortic root enlargement/  normal LVF, ef 65%  . CARDIAC CATHETERIZATION  03-22-2006    dr Martinique   no significant  obstructive cad/  severe aortic stenosis and mild to moderate aortic root enlargement/  upper normal right heart pressures  . CARDIOVASCULAR STRESS TEST  09-30-2011   dr Martinique   lexiscan nuclear study w/ apical thinning  vs  small prior apical infarct w/ no significant ischemia/  hypokinesis of the distal septum and apex, ef 50%  . CARPAL TUNNEL RELEASE Bilateral left 09-09-2004/  right 2007  . CATARACT EXTRACTION W/ INTRAOCULAR LENS  IMPLANT, BILATERAL  2015  . COLONOSCOPY N/A 10/03/2014   Procedure: COLONOSCOPY;  Surgeon: [name], MD;  Location: [facility];  Service: Endoscopy;  Laterality: N/A;  . CYSTOSCOPY WITH RETROGRADE PYELOGRAM, URETEROSCOPY AND STENT PLACEMENT Right 09/17/2016   Procedure: CYSTOSCOPY WITH RIGHT RETROGRADE PYELOGRAM, RIGHT URETEROSCOPY AND STENT PLACEMENT LASER LITHOTRIPSY, RIGHT STENT PLACEMENT;  Surgeon: [name], MD;  Location[facility]  Service: Urology;  Laterality: Right;  . ESOPHAGEAL MANOMETRY N/A 08/12/2014   Procedure: ESOPHAGEAL MANOMETRY (EM);  Surgeon: [name], MD;  Location: [facility] ENDOSCOPY;  Service: Endoscopy;  Laterality: N/A;  . ESOPHAGOGASTRODUODENOSCOPY N/A 10/03/2014   Procedure: ESOPHAGOGASTRODUODENOSCOPY (EGD);  Surgeon: [name], MD;  Location: [facility] ENDOSCOPY;  Service: Endoscopy;  Laterality: N/A;  . ESOPHAGOGASTRODUODENOSCOPY N/A 12/08/2018   Procedure: ESOPHAGOGASTRODUODENOSCOPY (EGD);  Surgeon: Doran Stabler, MD;  Location: [place];  Service: Gastroenterology;  Laterality: N/A;  . [place]  . FOOT SURGERY Right 2013   hammertoe x2  . HOLMIUM LASER APPLICATION Right 06/16/3085   Procedure: HOLMIUM LASER APPLICATION;  Surgeon: [name], MD;  Location: [facility];  Service: Urology;  Laterality: Right;  . PERCUTANEOUS NEPHROSTOLITHOTOMY  1993  . STONE EXTRACTION WITH BASKET Right 09/17/2016   Procedure: STONE EXTRACTION WITH BASKET;   Surgeon: [name], MD;  Location: [facility];  Service: Urology;  Laterality: Right;  . TOTAL ABDOMINAL HYSTERECTOMY  1971   w/ Bilateral Salpingoophorectomy  . TRANSTHORACIC ECHOCARDIOGRAM  10/24/2014   dr Martinique   hypokinesis of the basal and mid inferoseptal and inferior walls,  ef 40-45%,  grade 1 diastolic dysfunction/  mechanical bileaflet aortic valve noted w/ mild regur., peak grandient 72mHg, valve area 1.35cm^2/  severe MV calcification without stenosis w/ mild regurg., peak gradient 332mg/  mild LAE/  poss. atrial mass (MRI showed benign lipomatous hypertrophy of atrial septum) /tFrazier ButtR/mild TR    OB History   No obstetric history on file.      Home Medications    Prior to Admission medications   Medication Sig Start Date End Date Taking? Authorizing Provider  acetaminophen (TYLENOL) 500 MG tablet Take 1,000 mg by mouth every 6 (six) hours as needed (  head pain).     [provider]  amiodarone (PACERONE) 200 MG tablet Take 1 tablet (200 mg total) by mouth daily. 01/01/19   Patsey Berthold, NP  calcium carbonate (TUMS - DOSED IN MG ELEMENTAL CALCIUM) 500 MG chewable tablet Chew 1 tablet by mouth daily.    [provider]  calcium citrate-vitamin D (CITRACAL+D) 315-200 MG-UNIT per tablet Take 1 tablet by mouth 3 (three) times daily.    [provider]  carbidopa-levodopa (SINEMET IR) 25-100 MG tablet TAKE 2 TABLETS BY MOUTH THREE TIMES DAILY 03/26/19   Tat, Eustace Quail, DO  Cholecalciferol (VITAMIN D3) 2000 units TABS Take 1 capsule by mouth daily.    [provider]  famotidine-calcium carbonate-magnesium hydroxide (PEPCID COMPLETE) 10-800-165 MG CHEW chewable tablet Chew 1 tablet by mouth daily as needed (heartburn).    [provider]  KLOR-CON M20 20 MEQ tablet Take 20 mEq by mouth daily. 05/26/17   [provider]  KRILL OIL PO Take 1 capsule by mouth daily.     [provider]  Melatonin 3  MG TABS Take 3 mg by mouth at bedtime as needed (sleep).     [provider]  metoprolol tartrate (LOPRESSOR) 25 MG tablet Take 1 tablet (25 mg total) by mouth daily as needed (if heart rate is above 110). 01/15/19 04/15/19  Martinique, Peter M, MD  Multiple Vitamin (MULTIVITAMIN) tablet Take 1 tablet by mouth daily at 12 noon.     [provider]  Multiple Vitamins-Minerals (VISION-VITE PRESERVE PO) Take 1 tablet by mouth 2 (two) times daily.    [provider]  pantoprazole (PROTONIX) 40 MG tablet Take 40 mg by mouth daily. 06/07/17   [provider]  Probiotic Product (PROBIOTIC-10 PO) Take 1 capsule by mouth daily.     [provider]  simvastatin (ZOCOR) 20 MG tablet Take 20 mg by mouth every evening.    [provider]  traMADol (ULTRAM) 50 MG tablet Take 1 tablet (50 mg total) by mouth every 6 (six) hours as needed for up to 5 days. 06/15/19 06/20/19  Loura Halt A, NP  vitamin B-12 (CYANOCOBALAMIN) 1000 MCG tablet Take 1,000 mcg by mouth every morning.    [provider]  warfarin (COUMADIN) 5 MG tablet Take 5 mg by mouth. Take 57m S, T, W, TH and 2.5 M, F  by mouth every day    [provider]    Family History Family History  Problem Relation Age of Onset  . Heart disease Mother   . Heart disease Father 787      CABG  . Other Brother        AVR    Social History Social History   Tobacco Use  . Smoking status: Never Smoker  . Smokeless tobacco: Never Used  Substance Use Topics  . Alcohol use: No    Alcohol/week: 0.0 standard drinks  . Drug use: No     Allergies   Demerol [meperidine], Oxycodone, Adhesive [tape], Ivp dye [iodinated diagnostic agents], and Phenergan [promethazine]   Review of Systems Review of Systems   Physical Exam Triage Vital Signs ED Triage Vitals [06/15/19 1603]  Enc Vitals Group     BP (!) 165/80     Pulse Rate 66     Resp 18     Temp 98.1 F (36.7 C)     Temp Source Oral      SpO2 94 %     Weight  Height      Head Circumference      Peak Flow      Pain Score 10     Pain Loc      Pain Edu?      Excl. in [place]?    No data found.  Updated Vital Signs BP (!) 165/80 (BP Location: Left Arm)   Pulse 66   Temp 98.1 F (36.7 C) (Oral)   Resp 18   SpO2 94%   Visual Acuity Right Eye Distance:   Left Eye Distance:   Bilateral Distance:    Right Eye Near:   Left Eye Near:    Bilateral Near:     Physical Exam Vitals signs and nursing note reviewed.  Constitutional:      Appearance: She is well-developed.     Comments: Appears in pain   HENT:     Head: Normocephalic and atraumatic.  Neck:     Musculoskeletal: Normal range of motion.  Cardiovascular:     Heart sounds: Normal heart sounds.  Pulmonary:     Effort: Pulmonary effort is normal.     Breath sounds: Normal breath sounds.  Chest:     Chest wall: Tenderness present.     Comments: Tenderness to right rib area.  Musculoskeletal: Normal range of motion.  Skin:    General: Skin is warm and dry.     Findings: No erythema or rash.  Neurological:     Mental Status: She is alert.  Psychiatric:        Mood and Affect: Mood normal.      UC Treatments / Results  Labs (all labs ordered are listed, but only abnormal results are displayed) Labs Reviewed - No data to display  EKG None  Radiology No results found.  Procedures Procedures (including critical care time)  Medications Ordered in UC Medications - No data to display  Initial Impression / Assessment and Plan / UC Course  I have reviewed the triage vital signs and the nursing notes.  Pertinent labs & imaging results that were available during my care of the patient were reviewed by me and considered in my medical decision making (see chart for details).    Treating pain from known rib fractures.  Tramadol for pain.  Rib belt given Told to make sure that she is taking deep breaths to prevent PNA.  Follow up as  needed for continued or worsening symptoms  Final Clinical Impressions(s) / UC Diagnoses   Final diagnoses:  Closed fracture of multiple ribs of right side, initial encounter     Discharge Instructions     Treating your pain with tramadol and rib belt for comfort.  You can take the tylenol also as needed.  Make sure that you are doing deep breathing to prevent pneumonia.  Follow up as needed for continued or worsening symptoms     ED Prescriptions    Medication Sig Dispense Auth. Provider   traMADol (ULTRAM) 50 MG tablet Take 1 tablet (50 mg total) by mouth every 6 (six) hours as needed for up to 5 days. 20 tablet Orvan July, NP     Controlled Substance Prescriptions [place] Controlled Substance Registry consulted? no   Orvan July, NP 06/17/19 1220

## 2019-06-18 DIAGNOSIS — R351 Nocturia: Secondary | ICD-10-CM | POA: Diagnosis not present

## 2019-06-18 DIAGNOSIS — R310 Gross hematuria: Secondary | ICD-10-CM | POA: Diagnosis not present

## 2019-06-18 DIAGNOSIS — N200 Calculus of kidney: Secondary | ICD-10-CM | POA: Diagnosis not present

## 2019-06-19 DIAGNOSIS — I4891 Unspecified atrial fibrillation: Secondary | ICD-10-CM | POA: Diagnosis not present

## 2019-06-19 DIAGNOSIS — S2239XA Fracture of one rib, unspecified side, initial encounter for closed fracture: Secondary | ICD-10-CM | POA: Diagnosis not present

## 2019-06-19 DIAGNOSIS — Z1339 Encounter for screening examination for other mental health and behavioral disorders: Secondary | ICD-10-CM | POA: Diagnosis not present

## 2019-06-19 DIAGNOSIS — Z7901 Long term (current) use of anticoagulants: Secondary | ICD-10-CM | POA: Diagnosis not present

## 2019-06-19 DIAGNOSIS — G231 Progressive supranuclear ophthalmoplegia [Steele-Richardson-Olszewski]: Secondary | ICD-10-CM | POA: Diagnosis not present

## 2019-06-19 DIAGNOSIS — R1310 Dysphagia, unspecified: Secondary | ICD-10-CM | POA: Diagnosis not present

## 2019-06-19 DIAGNOSIS — R269 Unspecified abnormalities of gait and mobility: Secondary | ICD-10-CM | POA: Diagnosis not present

## 2019-06-19 DIAGNOSIS — Z1331 Encounter for screening for depression: Secondary | ICD-10-CM | POA: Diagnosis not present

## 2019-06-19 DIAGNOSIS — R319 Hematuria, unspecified: Secondary | ICD-10-CM | POA: Diagnosis not present

## 2019-06-19 DIAGNOSIS — I10 Essential (primary) hypertension: Secondary | ICD-10-CM | POA: Diagnosis not present

## 2019-06-19 DIAGNOSIS — D649 Anemia, unspecified: Secondary | ICD-10-CM | POA: Diagnosis not present

## 2019-06-19 DIAGNOSIS — E785 Hyperlipidemia, unspecified: Secondary | ICD-10-CM | POA: Diagnosis not present

## 2019-06-19 DIAGNOSIS — Z0000 Encounter for general adult medical examination without abnormal findings: Secondary | ICD-10-CM | POA: Diagnosis not present

## 2019-06-21 DIAGNOSIS — I358 Other nonrheumatic aortic valve disorders: Secondary | ICD-10-CM | POA: Diagnosis not present

## 2019-06-21 DIAGNOSIS — Z7901 Long term (current) use of anticoagulants: Secondary | ICD-10-CM | POA: Diagnosis not present

## 2019-06-21 DIAGNOSIS — I4891 Unspecified atrial fibrillation: Secondary | ICD-10-CM | POA: Diagnosis not present

## 2019-06-23 ENCOUNTER — Other Ambulatory Visit: Payer: Self-pay | Admitting: Nurse Practitioner

## 2019-06-23 ENCOUNTER — Other Ambulatory Visit: Payer: Self-pay | Admitting: Neurology

## 2019-06-25 DIAGNOSIS — R3121 Asymptomatic microscopic hematuria: Secondary | ICD-10-CM | POA: Diagnosis not present

## 2019-06-25 DIAGNOSIS — N200 Calculus of kidney: Secondary | ICD-10-CM | POA: Diagnosis not present

## 2019-06-25 DIAGNOSIS — N3946 Mixed incontinence: Secondary | ICD-10-CM | POA: Diagnosis not present

## 2019-06-25 NOTE — Telephone Encounter (Signed)
Requested Prescriptions   Pending Prescriptions Disp Refills  . carbidopa-levodopa (SINEMET IR) 25-100 MG tablet [Pharmacy Med Name: Carbidopa-Levodopa 25-100 MG Oral Tablet] 540 tablet 0    Sig: TAKE 2 TABLETS BY MOUTH THREE TIMES DAILY   Rx last filled:03/26/19 #540 0 refills  Pt last seen:04/17/19  Follow up appt scheduled:10/01/19

## 2019-06-26 ENCOUNTER — Emergency Department (HOSPITAL_COMMUNITY): Payer: PPO

## 2019-06-26 ENCOUNTER — Encounter (HOSPITAL_COMMUNITY): Payer: Self-pay

## 2019-06-26 ENCOUNTER — Emergency Department (HOSPITAL_COMMUNITY)
Admission: EM | Admit: 2019-06-26 | Discharge: 2019-06-26 | Disposition: A | Discharge status: Home / Self Care | Payer: PPO | Attending: Emergency Medicine | Admitting: Emergency Medicine

## 2019-06-26 ENCOUNTER — Other Ambulatory Visit: Payer: Self-pay

## 2019-06-26 DIAGNOSIS — G231 Progressive supranuclear ophthalmoplegia [Steele-Richardson-Olszewski]: Secondary | ICD-10-CM | POA: Diagnosis not present

## 2019-06-26 DIAGNOSIS — Z7901 Long term (current) use of anticoagulants: Secondary | ICD-10-CM | POA: Diagnosis not present

## 2019-06-26 DIAGNOSIS — G20 Parkinson's disease: Secondary | ICD-10-CM | POA: Insufficient documentation

## 2019-06-26 DIAGNOSIS — S0990XA Unspecified injury of head, initial encounter: Secondary | ICD-10-CM | POA: Diagnosis not present

## 2019-06-26 DIAGNOSIS — Y929 Unspecified place or not applicable: Secondary | ICD-10-CM | POA: Diagnosis not present

## 2019-06-26 DIAGNOSIS — I480 Paroxysmal atrial fibrillation: Secondary | ICD-10-CM | POA: Insufficient documentation

## 2019-06-26 DIAGNOSIS — S199XXA Unspecified injury of neck, initial encounter: Secondary | ICD-10-CM | POA: Diagnosis not present

## 2019-06-26 DIAGNOSIS — R079 Chest pain, unspecified: Secondary | ICD-10-CM | POA: Diagnosis not present

## 2019-06-26 DIAGNOSIS — S0003XA Contusion of scalp, initial encounter: Secondary | ICD-10-CM | POA: Diagnosis not present

## 2019-06-26 DIAGNOSIS — I872 Venous insufficiency (chronic) (peripheral): Secondary | ICD-10-CM | POA: Diagnosis not present

## 2019-06-26 DIAGNOSIS — S299XXA Unspecified injury of thorax, initial encounter: Secondary | ICD-10-CM | POA: Diagnosis not present

## 2019-06-26 DIAGNOSIS — R7989 Other specified abnormal findings of blood chemistry: Secondary | ICD-10-CM | POA: Insufficient documentation

## 2019-06-26 DIAGNOSIS — Z79899 Other long term (current) drug therapy: Secondary | ICD-10-CM | POA: Insufficient documentation

## 2019-06-26 DIAGNOSIS — Y939 Activity, unspecified: Secondary | ICD-10-CM | POA: Diagnosis not present

## 2019-06-26 DIAGNOSIS — M542 Cervicalgia: Secondary | ICD-10-CM | POA: Diagnosis not present

## 2019-06-26 DIAGNOSIS — I4892 Unspecified atrial flutter: Secondary | ICD-10-CM | POA: Diagnosis not present

## 2019-06-26 DIAGNOSIS — M8008XD Age-related osteoporosis with current pathological fracture, vertebra(e), subsequent encounter for fracture with routine healing: Secondary | ICD-10-CM | POA: Diagnosis not present

## 2019-06-26 DIAGNOSIS — H532 Diplopia: Secondary | ICD-10-CM | POA: Diagnosis not present

## 2019-06-26 DIAGNOSIS — I110 Hypertensive heart disease with heart failure: Secondary | ICD-10-CM | POA: Diagnosis not present

## 2019-06-26 DIAGNOSIS — I129 Hypertensive chronic kidney disease with stage 1 through stage 4 chronic kidney disease, or unspecified chronic kidney disease: Secondary | ICD-10-CM | POA: Diagnosis not present

## 2019-06-26 DIAGNOSIS — W19XXXA Unspecified fall, initial encounter: Secondary | ICD-10-CM | POA: Insufficient documentation

## 2019-06-26 DIAGNOSIS — Z9181 History of falling: Secondary | ICD-10-CM | POA: Diagnosis not present

## 2019-06-26 DIAGNOSIS — N182 Chronic kidney disease, stage 2 (mild): Secondary | ICD-10-CM | POA: Diagnosis not present

## 2019-06-26 DIAGNOSIS — R791 Abnormal coagulation profile: Secondary | ICD-10-CM | POA: Diagnosis not present

## 2019-06-26 DIAGNOSIS — K219 Gastro-esophageal reflux disease without esophagitis: Secondary | ICD-10-CM | POA: Diagnosis not present

## 2019-06-26 DIAGNOSIS — S098XXA Other specified injuries of head, initial encounter: Secondary | ICD-10-CM | POA: Diagnosis not present

## 2019-06-26 DIAGNOSIS — R42 Dizziness and giddiness: Secondary | ICD-10-CM | POA: Diagnosis not present

## 2019-06-26 DIAGNOSIS — I509 Heart failure, unspecified: Secondary | ICD-10-CM | POA: Diagnosis not present

## 2019-06-26 DIAGNOSIS — E785 Hyperlipidemia, unspecified: Secondary | ICD-10-CM | POA: Diagnosis not present

## 2019-06-26 DIAGNOSIS — Z853 Personal history of malignant neoplasm of breast: Secondary | ICD-10-CM | POA: Diagnosis not present

## 2019-06-26 DIAGNOSIS — I4891 Unspecified atrial fibrillation: Secondary | ICD-10-CM | POA: Diagnosis not present

## 2019-06-26 DIAGNOSIS — M2011 Hallux valgus (acquired), right foot: Secondary | ICD-10-CM | POA: Diagnosis not present

## 2019-06-26 DIAGNOSIS — Y999 Unspecified external cause status: Secondary | ICD-10-CM | POA: Diagnosis not present

## 2019-06-26 DIAGNOSIS — R1310 Dysphagia, unspecified: Secondary | ICD-10-CM | POA: Diagnosis not present

## 2019-06-26 LAB — CBC
HCT: 30.7 % — ABNORMAL LOW (ref 36.0–46.0)
Hemoglobin: 9.9 g/dL — ABNORMAL LOW (ref 12.0–15.0)
MCH: 35.1 pg — ABNORMAL HIGH (ref 26.0–34.0)
MCHC: 32.2 g/dL (ref 30.0–36.0)
MCV: 108.9 fL — ABNORMAL HIGH (ref 80.0–100.0)
Platelets: 220 10*3/uL (ref 150–400)
RBC: 2.82 MIL/uL — ABNORMAL LOW (ref 3.87–5.11)
RDW: 13.4 % (ref 11.5–15.5)
WBC: 7.3 10*3/uL (ref 4.0–10.5)
nRBC: 0 % (ref 0.0–0.2)

## 2019-06-26 LAB — BASIC METABOLIC PANEL
Anion gap: 7 (ref 5–15)
BUN: 16 mg/dL (ref 8–23)
CO2: 28 mmol/L (ref 22–32)
Calcium: 9.1 mg/dL (ref 8.9–10.3)
Chloride: 101 mmol/L (ref 98–111)
Creatinine, Ser: 1.44 mg/dL — ABNORMAL HIGH (ref 0.44–1.00)
GFR calc Af Amer: 40 mL/min — ABNORMAL LOW (ref >60)
GFR calc non Af Amer: 34 mL/min — ABNORMAL LOW (ref >60)
Glucose, Bld: 88 mg/dL (ref 70–99)
Potassium: 4.1 mmol/L (ref 3.5–5.1)
Sodium: 136 mmol/L (ref 135–145)

## 2019-06-26 LAB — PROTIME-INR
INR: 4 — ABNORMAL HIGH (ref 0.8–1.2)
Prothrombin Time: 38.3 seconds — ABNORMAL HIGH (ref 11.4–15.2)

## 2019-06-26 MED ORDER — ACETAMINOPHEN 325 MG PO TABS
650.0000 mg | ORAL_TABLET | Freq: Once | ORAL | Status: AC
  Administered 2019-06-26: 650 mg via ORAL
  Filled 2019-06-26: qty 2

## 2019-06-26 NOTE — ED Triage Notes (Signed)
Fell backwards and hit head on floor.  Patient also fell a week ago and has 4 broken ribs.

## 2019-06-26 NOTE — ED Notes (Signed)
Husband updated on wife's condition

## 2019-06-26 NOTE — ED Notes (Signed)
Got patient back in bed after the bathroom patient is back on the monitor in bed resting with call bell in reach

## 2019-06-26 NOTE — Discharge Instructions (Signed)
Your INR remains slightly elevated.  Hold your dose of Coumadin this evening.  Follow-up with your doctor to discuss your Coumadin dosing.  Your blood test show that you continue to have a anemia.  Follow-up with your doctor to have that rechecked to make sure it stable.  Return as needed for worsening symptoms

## 2019-06-26 NOTE — ED Provider Notes (Signed)
[facility] EMERGENCY DEPARTMENT Provider Note   CSN: [id number] Arrival date & time: 06/26/19  0539    History   Chief Complaint Chief Complaint  Patient presents with   Fall    HPI [name] is a 78 y.o. female.     HPI Pt was walking this morning.  She started coughing and lost her balance falling backwards.  Pt hit the back of her head.  She may have lost conscioussness for a second. Since then she has been feeling dizzy. Pt has ben able to get up and walk since the fall.  Denies any other injuries although she did hit her elbow.  Pt takes coumadin and knew she needed to get checked out.   Past Medical History:  Diagnosis Date   Arthropathy of cervical facet joint    C2-3   At risk for falls    BPV (benign positional vertigo)    Cancer ([place]) 2004    breast-lumpectomy,nodes ,radiation,chemo   Chronic neck pain    CKD (chronic kidney disease), stage II    Diverticulosis of colon    Elevated troponin    a. 08/2016 in setting of PAF;  b. 08/2016 low risk MV w/ fixed septal defect (LBBB), EF 53%.   GERD (gastroesophageal reflux disease)    Hiatal hernia    History of aortic valve stenosis    History of breast cancer per pt no recurrence   dx 2004-- Left breast DCIS (ER/PR+, HER2 negative) s/p  partial masectomy w/ sln bx and  chemoradiotion (completed 2004)   History of colon polyps    History of herpes zoster    History of kidney stones    HTN (hypertension)    Hyperlipidemia    Hypertrophy of inter-atrial septum    a. 09/2014 Echo: ? of atrial mass; b. 10/2014 Cardiac MRI: no atrial septal mass - polypoid lipomatous hypertrophy of superior atrial septum noted-->Benign.   Irritable bowel syndrome    LBBB (left bundle branch block)    PAF (paroxysmal atrial fibrillation) (HCC)    a. CHA2DS2VASc = 4-->coumadin.   Parkinsonism [facility])    neurologist-  dr tat   Personal history of chemotherapy 2004   Personal history  of radiation therapy 2004   PONV (postoperative nausea and vomiting)    PVC's (premature ventricular contractions)    Right ureteral stone    S/P aortic valve replacement with prosthetic valve    a. 04/13/2006 s/p St Jude mechnical AVR for severe AS;  b. 09/2014 Echo: EF 40-45%, gr1 DD, mild AI/MR, triv TR/PR.   SUI (stress urinary incontinence, female)    Symptomatic anemia    Venous varices      Patient Active Problem List   Diagnosis Date Noted   Atrial flutter ([place])    Heme positive stool    Symptomatic anemia 12/05/2018   GI bleed 12/05/2018   CKD (chronic kidney disease), stage III ([place]) 12/05/2018   Elevated d-dimer 12/05/2018   SOB (shortness of breath) 12/05/2018   Supratherapeutic INR 12/05/2018   Parkinsonism ([place])    Elevated troponin 09/19/2016   Emesis 09/19/2016   UTI (urinary tract infection) 09/19/2016   Sinus bradycardia 09/16/2016   Family history of colon cancer 07/30/2014   Hiatal hernia 07/30/2014   Chest pain 10/13/2011   Disequilibrium 10/13/2011   Heart valve replaced by other means 04/16/2011   Chronic anticoagulation 03/17/2011   S/P AVR (aortic valve replacement)    PVC's (premature ventricular contractions)  HTN (hypertension)    Hypercholesteremia    Atrial fibrillation with RVR (HCC)    GERD (gastroesophageal reflux disease)    GERD 02/06/2010   DIVERTICULOSIS-COLON 02/06/2010   PERSONAL HX COLONIC POLYPS 02/06/2010   MASTECTOMY, HX OF 02/06/2010    Past Surgical History:  Procedure Laterality Date   ANTERIOR AND POSTERIOR REPAIR  1990   cystocele/ rectocele   AORTIC VALVE REPLACEMENT  04/13/06   dr gerhardt   and Replacement Ascending Aorta (St. Jude mechanical prosthesis)   APPENDECTOMY  child 1950   BREAST LUMPECTOMY Left 01/11/2003   malignant   BREAST SURGERY Left 2004   CARDIAC CATHETERIZATION  02-22-2001   dr Martinique   minimal non-obstructive cad/  mild aortic stenosis and aortic root  enlargement/  normal LVF, ef 65%   CARDIAC CATHETERIZATION  03-22-2006    dr Martinique   no significant obstructive cad/  severe aortic stenosis and mild to moderate aortic root enlargement/  upper normal right heart pressures   CARDIOVASCULAR STRESS TEST  09-30-2011   dr Martinique   lexiscan nuclear study w/ apical thinning  vs  small prior apical infarct w/ no significant ischemia/  hypokinesis of the distal septum and apex, ef 50%   CARPAL TUNNEL RELEASE Bilateral left 09-09-2004/  right 2007   CATARACT EXTRACTION W/ INTRAOCULAR LENS  IMPLANT, BILATERAL  2015   COLONOSCOPY N/A 10/03/2014   Procedure: COLONOSCOPY;  Surgeon: [name], MD;  Location: [place];  Service: Endoscopy;  Laterality: N/A;   CYSTOSCOPY WITH RETROGRADE PYELOGRAM, URETEROSCOPY AND STENT PLACEMENT Right 09/17/2016   Procedure: CYSTOSCOPY WITH RIGHT RETROGRADE PYELOGRAM, RIGHT URETEROSCOPY AND STENT PLACEMENT LASER LITHOTRIPSY, RIGHT STENT PLACEMENT;  Surgeon: [name], MD;  Location: [facility];  Service: Urology;  Laterality: Right;   ESOPHAGEAL MANOMETRY N/A 08/12/2014   Procedure: ESOPHAGEAL MANOMETRY (EM);  Surgeon: [name], MD;  Location: [facility];  Service: Endoscopy;  Laterality: N/A;   ESOPHAGOGASTRODUODENOSCOPY N/A 10/03/2014   Procedure: ESOPHAGOGASTRODUODENOSCOPY (EGD);  Surgeon: [name], MD;  Location: [facility] ENDOSCOPY;  Service: Endoscopy;  Laterality: N/A;   ESOPHAGOGASTRODUODENOSCOPY N/A 12/08/2018   Procedure: ESOPHAGOGASTRODUODENOSCOPY (EGD);  Surgeon: Doran Stabler, MD;  Location: [facility];  Service: Gastroenterology;  Laterality: N/A;   EXTRACORPOREAL SHOCK WAVE LITHOTRIPSY  1997   FOOT SURGERY Right 2013   hammertoe x2   HOLMIUM LASER APPLICATION Right 3/71/0626   Procedure: HOLMIUM LASER APPLICATION;  Surgeon: [name], MD;  Location: [facility];  Service: Urology;  Laterality: Right;   PERCUTANEOUS  NEPHROSTOLITHOTOMY  1993   STONE EXTRACTION WITH BASKET Right 09/17/2016   Procedure: STONE EXTRACTION WITH BASKET;  Surgeon: [name], MD;  Location: [facility];  Service: Urology;  Laterality: Right;   TOTAL ABDOMINAL HYSTERECTOMY  1971   w/ Bilateral Salpingoophorectomy   TRANSTHORACIC ECHOCARDIOGRAM  10/24/2014   dr Martinique   hypokinesis of the basal and mid inferoseptal and inferior walls,  ef 40-45%,  grade 1 diastolic dysfunction/  mechanical bileaflet aortic valve noted w/ mild regur., peak grandient 85mHg, valve area 1.35cm^2/  severe MV calcification without stenosis w/ mild regurg., peak gradient 371mg/  mild LAE/  poss. atrial mass (MRI showed benign lipomatous hypertrophy of atrial septum) /tFrazier ButtR/mild TR     OB History   No obstetric history on file.      Home Medications    Prior to Admission medications   Medication Sig Start Date End Date Taking? Authorizing  Provider  acetaminophen (TYLENOL) 500 MG tablet Take 1,000 mg by mouth every 6 (six) hours as needed (head pain).     [provider]  amiodarone (PACERONE) 200 MG tablet Take 1 tablet (200 mg total) by mouth daily. 01/01/19   Patsey Berthold, NP  calcium carbonate (TUMS - DOSED IN MG ELEMENTAL CALCIUM) 500 MG chewable tablet Chew 1 tablet by mouth daily.    [provider]  calcium citrate-vitamin D (CITRACAL+D) 315-200 MG-UNIT per tablet Take 1 tablet by mouth 3 (three) times daily.    [provider]  carbidopa-levodopa (SINEMET IR) 25-100 MG tablet TAKE 2 TABLETS BY MOUTH THREE TIMES DAILY 06/25/19   Tat, Eustace Quail, DO  Cholecalciferol (VITAMIN D3) 2000 units TABS Take 1 capsule by mouth daily.    [provider]  famotidine-calcium carbonate-magnesium hydroxide (PEPCID COMPLETE) 10-800-165 MG CHEW chewable tablet Chew 1 tablet by mouth daily as needed (heartburn).    [provider]  KLOR-CON M20 20 MEQ tablet Take 20 mEq by mouth daily.  05/26/17   [provider]  KRILL OIL PO Take 1 capsule by mouth daily.     [provider]  Melatonin 3 MG TABS Take 3 mg by mouth at bedtime as needed (sleep).     [provider]  metoprolol tartrate (LOPRESSOR) 25 MG tablet Take 1 tablet (25 mg total) by mouth daily as needed (if heart rate is above 110). 01/15/19 04/15/19  Martinique, Peter M, MD  Multiple Vitamin (MULTIVITAMIN) tablet Take 1 tablet by mouth daily at 12 noon.     [provider]  Multiple Vitamins-Minerals (VISION-VITE PRESERVE PO) Take 1 tablet by mouth 2 (two) times daily.    [provider]  pantoprazole (PROTONIX) 40 MG tablet Take 40 mg by mouth daily. 06/07/17   [provider]  Probiotic Product (PROBIOTIC-10 PO) Take 1 capsule by mouth daily.     [provider]  simvastatin (ZOCOR) 20 MG tablet Take 20 mg by mouth every evening.    [provider]  vitamin B-12 (CYANOCOBALAMIN) 1000 MCG tablet Take 1,000 mcg by mouth every morning.    [provider]  warfarin (COUMADIN) 5 MG tablet Take 5 mg by mouth. Take 79m S, T, W, TH and 2.5 M, F  by mouth every day    [provider]    Family History Family History  Problem Relation Age of Onset   Heart disease Mother    Heart disease Father 758      CABG   Other Brother        AVR    Social History Social History   Tobacco Use   Smoking status: Never Smoker   Smokeless tobacco: Never Used  Substance Use Topics   Alcohol use: No    Alcohol/week: 0.0 standard drinks   Drug use: No     Allergies   Demerol [meperidine], Oxycodone, Adhesive [tape], Ivp dye [iodinated diagnostic agents], and Phenergan [promethazine]   Review of Systems Review of Systems  Respiratory: Negative for shortness of breath.   Cardiovascular: Negative for chest pain.  Gastrointestinal: Negative for blood in stool.  Genitourinary: Negative for dysuria.  Musculoskeletal: Negative for neck  pain.  Neurological: Positive for headaches.  All other systems reviewed and are negative.    Physical Exam Updated Vital Signs BP (!) 145/73    Pulse 62    Resp (!) 22    Ht 1.499 m (4' 11" )    Wt  59.4 kg    SpO2 95%    BMI 26.46 kg/m   Physical Exam Vitals signs and nursing note reviewed.  Constitutional:      General: She is not in acute distress.    Appearance: She is well-developed.  HENT:     Head: Normocephalic and atraumatic.     Right Ear: External ear normal.     Left Ear: External ear normal.  Eyes:     General: No scleral icterus.       Right eye: No discharge.        Left eye: No discharge.     Conjunctiva/sclera: Conjunctivae normal.  Neck:     Musculoskeletal: Neck supple.     Trachea: No tracheal deviation.  Cardiovascular:     Rate and Rhythm: Normal rate and regular rhythm.  Pulmonary:     Effort: Pulmonary effort is normal. No respiratory distress.     Breath sounds: Normal breath sounds. No stridor. No wheezing or rales.  Abdominal:     General: Bowel sounds are normal. There is no distension.     Palpations: Abdomen is soft.     Tenderness: There is no abdominal tenderness. There is no guarding or rebound.  Musculoskeletal:     Cervical back: She exhibits tenderness.  Skin:    General: Skin is warm and dry.     Findings: No rash.  Neurological:     Mental Status: She is alert.     Cranial Nerves: No cranial nerve deficit (no facial droop, extraocular movements intact, no slurred speech).     Sensory: No sensory deficit.     Motor: No abnormal muscle tone or seizure activity.     Coordination: Coordination normal.      ED Treatments / Results  Labs (all labs ordered are listed, but only abnormal results are displayed) Labs Reviewed  CBC - Abnormal; Notable for the following components:      Result Value   RBC 2.82 (*)    Hemoglobin 9.9 (*)    HCT 30.7 (*)    MCV 108.9 (*)    MCH 35.1 (*)    All other components within normal limits    BASIC METABOLIC PANEL - Abnormal; Notable for the following components:   Creatinine, Ser 1.44 (*)    GFR calc non Af Amer 34 (*)    GFR calc Af Amer 40 (*)    All other components within normal limits  PROTIME-INR - Abnormal; Notable for the following components:   Prothrombin Time 38.3 (*)    INR 4.0 (*)    All other components within normal limits    EKG EKG Interpretation  Date/Time:  Tuesday June 26 2019 08:59:50 EDT Ventricular Rate:  60 PR Interval:    QRS Duration: 155 QT Interval:  493 QTC Calculation: 493 R Axis:   -115 Text Interpretation:  Sinus rhythm Borderline prolonged PR interval Nonspecific IVCD with LAD Anterior infarct, old Since last tracing rate slower atrial flutter resolved Confirmed by Dorie Rank [phone]) on 06/26/2019 9:03:45 AM   Radiology Dg Chest 2 View  Result Date: 06/26/2019 CLINICAL DATA:  Pain following fall EXAM: CHEST - 2 VIEW COMPARISON:  October 03, 2014 FINDINGS: There is no edema or consolidation. Heart is mildly enlarged with pulmonary vascularity normal. Patient is status post aortic valve replacement. There is a focal hiatal hernia. There is aortic atherosclerosis. Bones are osteoporotic. IMPRESSION: No edema or consolidation. Hiatal hernia present. Heart mildly enlarged with aortic valve replacement. Aortic  Atherosclerosis (ICD10-I70.0). Electronically Signed   By: Lowella Grip III M.D.   On: 06/26/2019 08:24   Ct Head Wo Contrast  Result Date: 06/26/2019 CLINICAL DATA:  Pain following fall. Episode of loss of consciousness EXAM: CT HEAD WITHOUT CONTRAST CT CERVICAL SPINE WITHOUT CONTRAST TECHNIQUE: Multidetector CT imaging of the head and cervical spine was performed following the standard protocol without intravenous contrast. Multiplanar CT image reconstructions of the cervical spine were also generated. COMPARISON:  CT head and cervical spine CT January 07, 2019 FINDINGS: CT HEAD FINDINGS Brain: There is age related volume loss. There  is no intracranial mass, hemorrhage, extra-axial fluid collection, or midline shift. There is slight small vessel disease in the centra semiovale bilaterally. No acute infarct is evident. Vascular: There is no hyperdense vessel. There is calcification in the distal left vertebral artery and in the carotid siphon regions bilaterally. Skull: The bony calvarium appears intact. There is a right parietal scalp hematoma. Sinuses/Orbits: There is mucosal thickening in multiple ethmoid air cells. Visualized paranasal sinuses elsewhere clear. There is rightward deviation of the nasal septum. Orbits appear symmetric bilaterally. Other: Mastoid air cells are clear. CT CERVICAL SPINE FINDINGS Alignment: There is no demonstrable spondylolisthesis. There is upper thoracic levoscoliosis. Skull base and vertebrae: Skull base and craniocervical junction regions appear normal. No fracture is evident. There are no blastic or lytic bone lesions. Soft tissues and spinal canal: Prevertebral soft tissues and predental space regions are normal. No cord canal hematoma evident. No paraspinous lesions. Disc levels: There is moderate disc space narrowing at C5-6, C6-7, and C7-T1. There is facet hypertrophy at several levels. There is no frank disc extrusion or stenosis. Upper chest: Visualized upper lung regions are clear. There is aortic atherosclerosis. Other: There is calcification in each carotid artery. IMPRESSION: CT head: Age related volume loss with mild periventricular small vessel disease. No acute infarct. No mass or hemorrhage. There are foci of arterial vascular calcification. There is a right parietal scalp hematoma.  No fracture. There is mucosal thickening in several ethmoid air cells. There is nasal septal deviation. CT cervical spine: No fracture or spondylolisthesis. Upper thoracic levoscoliosis. Osteoarthritic change noted at several levels. No disc extrusion or stenosis. There is aortic atherosclerosis as well as foci of  carotid artery calcification. Electronically Signed   By: Lowella Grip III M.D.   On: 06/26/2019 08:57   Ct Cervical Spine Wo Contrast  Result Date: 06/26/2019 CLINICAL DATA:  Pain following fall. Episode of loss of consciousness EXAM: CT HEAD WITHOUT CONTRAST CT CERVICAL SPINE WITHOUT CONTRAST TECHNIQUE: Multidetector CT imaging of the head and cervical spine was performed following the standard protocol without intravenous contrast. Multiplanar CT image reconstructions of the cervical spine were also generated. COMPARISON:  CT head and cervical spine CT January 07, 2019 FINDINGS: CT HEAD FINDINGS Brain: There is age related volume loss. There is no intracranial mass, hemorrhage, extra-axial fluid collection, or midline shift. There is slight small vessel disease in the centra semiovale bilaterally. No acute infarct is evident. Vascular: There is no hyperdense vessel. There is calcification in the distal left vertebral artery and in the carotid siphon regions bilaterally. Skull: The bony calvarium appears intact. There is a right parietal scalp hematoma. Sinuses/Orbits: There is mucosal thickening in multiple ethmoid air cells. Visualized paranasal sinuses elsewhere clear. There is rightward deviation of the nasal septum. Orbits appear symmetric bilaterally. Other: Mastoid air cells are clear. CT CERVICAL SPINE FINDINGS Alignment: There is no demonstrable spondylolisthesis. There is upper thoracic  levoscoliosis. Skull base and vertebrae: Skull base and craniocervical junction regions appear normal. No fracture is evident. There are no blastic or lytic bone lesions. Soft tissues and spinal canal: Prevertebral soft tissues and predental space regions are normal. No cord canal hematoma evident. No paraspinous lesions. Disc levels: There is moderate disc space narrowing at C5-6, C6-7, and C7-T1. There is facet hypertrophy at several levels. There is no frank disc extrusion or stenosis. Upper chest: Visualized  upper lung regions are clear. There is aortic atherosclerosis. Other: There is calcification in each carotid artery. IMPRESSION: CT head: Age related volume loss with mild periventricular small vessel disease. No acute infarct. No mass or hemorrhage. There are foci of arterial vascular calcification. There is a right parietal scalp hematoma.  No fracture. There is mucosal thickening in several ethmoid air cells. There is nasal septal deviation. CT cervical spine: No fracture or spondylolisthesis. Upper thoracic levoscoliosis. Osteoarthritic change noted at several levels. No disc extrusion or stenosis. There is aortic atherosclerosis as well as foci of carotid artery calcification. Electronically Signed   By: Lowella Grip III M.D.   On: 06/26/2019 08:57    Procedures Procedures (including critical care time)  Medications Ordered in ED Medications  acetaminophen (TYLENOL) tablet 650 mg (650 mg Oral Given 06/26/19 0920)     Initial Impression / Assessment and Plan / ED Course  I have reviewed the triage vital signs and the nursing notes.  Pertinent labs & imaging results that were available during my care of the patient were reviewed by me and considered in my medical decision making (see chart for details).    Patient presented to the ED after a fall.  It sounds most like a mechanical fall.  She denies feeling sick or ill to suggest some type of syncopal episode.  No focal neurologic deficits to suggest stroke.  Patient's laboratory tests show a stable creatinine.  She does have an anemia but this appears to be chronic and stable as well.  Months ago her hemoglobin was 8.9.  Patient denies any active bleeding.  Believe this is chronic.  X-rays do not show any signs of internal bleeding or fracture.  Patient does have an elevated INR.  She states that she was recently told to decrease her dose in half.  I will have her hold her dose this evening and suggested she talk to her doctor about  continued adjustment of her Coumadin dosing.  Final Clinical Impressions(s) / ED Diagnoses   Final diagnoses:  Minor head injury, initial encounter  Elevated INR    ED Discharge Orders    None       Dorie Rank, MD 06/26/19 1032

## 2019-06-26 NOTE — ED Notes (Signed)
Patients husband called and waiting outside ED to pick up patient. Patient verbalized understanding of discharge instructions. Wheeled out to husband by tech.

## 2019-06-28 DIAGNOSIS — R269 Unspecified abnormalities of gait and mobility: Secondary | ICD-10-CM | POA: Diagnosis not present

## 2019-06-28 DIAGNOSIS — G20 Parkinson's disease: Secondary | ICD-10-CM | POA: Diagnosis not present

## 2019-06-28 DIAGNOSIS — R0781 Pleurodynia: Secondary | ICD-10-CM | POA: Diagnosis not present

## 2019-07-04 DIAGNOSIS — I872 Venous insufficiency (chronic) (peripheral): Secondary | ICD-10-CM | POA: Diagnosis not present

## 2019-07-04 DIAGNOSIS — G231 Progressive supranuclear ophthalmoplegia [Steele-Richardson-Olszewski]: Secondary | ICD-10-CM | POA: Diagnosis not present

## 2019-07-04 DIAGNOSIS — Z7901 Long term (current) use of anticoagulants: Secondary | ICD-10-CM | POA: Diagnosis not present

## 2019-07-04 DIAGNOSIS — H532 Diplopia: Secondary | ICD-10-CM | POA: Diagnosis not present

## 2019-07-04 DIAGNOSIS — Z853 Personal history of malignant neoplasm of breast: Secondary | ICD-10-CM | POA: Diagnosis not present

## 2019-07-04 DIAGNOSIS — I4891 Unspecified atrial fibrillation: Secondary | ICD-10-CM | POA: Diagnosis not present

## 2019-07-04 DIAGNOSIS — Z9181 History of falling: Secondary | ICD-10-CM | POA: Diagnosis not present

## 2019-07-04 DIAGNOSIS — I509 Heart failure, unspecified: Secondary | ICD-10-CM | POA: Diagnosis not present

## 2019-07-04 DIAGNOSIS — M2011 Hallux valgus (acquired), right foot: Secondary | ICD-10-CM | POA: Diagnosis not present

## 2019-07-04 DIAGNOSIS — I110 Hypertensive heart disease with heart failure: Secondary | ICD-10-CM | POA: Diagnosis not present

## 2019-07-04 DIAGNOSIS — M8008XD Age-related osteoporosis with current pathological fracture, vertebra(e), subsequent encounter for fracture with routine healing: Secondary | ICD-10-CM | POA: Diagnosis not present

## 2019-07-04 DIAGNOSIS — R1310 Dysphagia, unspecified: Secondary | ICD-10-CM | POA: Diagnosis not present

## 2019-07-04 DIAGNOSIS — E785 Hyperlipidemia, unspecified: Secondary | ICD-10-CM | POA: Diagnosis not present

## 2019-07-04 DIAGNOSIS — K219 Gastro-esophageal reflux disease without esophagitis: Secondary | ICD-10-CM | POA: Diagnosis not present

## 2019-07-04 DIAGNOSIS — G20 Parkinson's disease: Secondary | ICD-10-CM | POA: Diagnosis not present

## 2019-07-05 DIAGNOSIS — Z7901 Long term (current) use of anticoagulants: Secondary | ICD-10-CM | POA: Diagnosis not present

## 2019-07-05 DIAGNOSIS — I358 Other nonrheumatic aortic valve disorders: Secondary | ICD-10-CM | POA: Diagnosis not present

## 2019-07-05 DIAGNOSIS — I4891 Unspecified atrial fibrillation: Secondary | ICD-10-CM | POA: Diagnosis not present

## 2019-07-09 ENCOUNTER — Ambulatory Visit
Admission: RE | Admit: 2019-07-09 | Discharge: 2019-07-09 | Disposition: A | Discharge status: Home / Self Care | Payer: PPO | Source: Ambulatory Visit | Attending: Internal Medicine | Admitting: Internal Medicine

## 2019-07-09 DIAGNOSIS — Z1231 Encounter for screening mammogram for malignant neoplasm of breast: Secondary | ICD-10-CM

## 2019-07-16 DIAGNOSIS — I358 Other nonrheumatic aortic valve disorders: Secondary | ICD-10-CM | POA: Diagnosis not present

## 2019-07-16 DIAGNOSIS — I4891 Unspecified atrial fibrillation: Secondary | ICD-10-CM | POA: Diagnosis not present

## 2019-07-16 DIAGNOSIS — Z7901 Long term (current) use of anticoagulants: Secondary | ICD-10-CM | POA: Diagnosis not present

## 2019-07-18 DIAGNOSIS — M2011 Hallux valgus (acquired), right foot: Secondary | ICD-10-CM | POA: Diagnosis not present

## 2019-07-18 DIAGNOSIS — M8008XD Age-related osteoporosis with current pathological fracture, vertebra(e), subsequent encounter for fracture with routine healing: Secondary | ICD-10-CM | POA: Diagnosis not present

## 2019-07-18 DIAGNOSIS — I509 Heart failure, unspecified: Secondary | ICD-10-CM | POA: Diagnosis not present

## 2019-07-18 DIAGNOSIS — Z7901 Long term (current) use of anticoagulants: Secondary | ICD-10-CM | POA: Diagnosis not present

## 2019-07-18 DIAGNOSIS — R1310 Dysphagia, unspecified: Secondary | ICD-10-CM | POA: Diagnosis not present

## 2019-07-18 DIAGNOSIS — H532 Diplopia: Secondary | ICD-10-CM | POA: Diagnosis not present

## 2019-07-18 DIAGNOSIS — G20 Parkinson's disease: Secondary | ICD-10-CM | POA: Diagnosis not present

## 2019-07-18 DIAGNOSIS — K219 Gastro-esophageal reflux disease without esophagitis: Secondary | ICD-10-CM | POA: Diagnosis not present

## 2019-07-18 DIAGNOSIS — I872 Venous insufficiency (chronic) (peripheral): Secondary | ICD-10-CM | POA: Diagnosis not present

## 2019-07-18 DIAGNOSIS — Z9181 History of falling: Secondary | ICD-10-CM | POA: Diagnosis not present

## 2019-07-18 DIAGNOSIS — I110 Hypertensive heart disease with heart failure: Secondary | ICD-10-CM | POA: Diagnosis not present

## 2019-07-18 DIAGNOSIS — Z853 Personal history of malignant neoplasm of breast: Secondary | ICD-10-CM | POA: Diagnosis not present

## 2019-07-18 DIAGNOSIS — G231 Progressive supranuclear ophthalmoplegia [Steele-Richardson-Olszewski]: Secondary | ICD-10-CM | POA: Diagnosis not present

## 2019-07-18 DIAGNOSIS — E785 Hyperlipidemia, unspecified: Secondary | ICD-10-CM | POA: Diagnosis not present

## 2019-07-18 DIAGNOSIS — I4891 Unspecified atrial fibrillation: Secondary | ICD-10-CM | POA: Diagnosis not present

## 2019-07-31 DIAGNOSIS — I4891 Unspecified atrial fibrillation: Secondary | ICD-10-CM | POA: Diagnosis not present

## 2019-07-31 DIAGNOSIS — I358 Other nonrheumatic aortic valve disorders: Secondary | ICD-10-CM | POA: Diagnosis not present

## 2019-07-31 DIAGNOSIS — H524 Presbyopia: Secondary | ICD-10-CM | POA: Diagnosis not present

## 2019-07-31 DIAGNOSIS — H353131 Nonexudative age-related macular degeneration, bilateral, early dry stage: Secondary | ICD-10-CM | POA: Diagnosis not present

## 2019-07-31 DIAGNOSIS — Z7901 Long term (current) use of anticoagulants: Secondary | ICD-10-CM | POA: Diagnosis not present

## 2019-07-31 DIAGNOSIS — Z961 Presence of intraocular lens: Secondary | ICD-10-CM | POA: Diagnosis not present

## 2019-08-01 DIAGNOSIS — H532 Diplopia: Secondary | ICD-10-CM | POA: Diagnosis not present

## 2019-08-01 DIAGNOSIS — K219 Gastro-esophageal reflux disease without esophagitis: Secondary | ICD-10-CM | POA: Diagnosis not present

## 2019-08-01 DIAGNOSIS — Z7901 Long term (current) use of anticoagulants: Secondary | ICD-10-CM | POA: Diagnosis not present

## 2019-08-01 DIAGNOSIS — M8008XD Age-related osteoporosis with current pathological fracture, vertebra(e), subsequent encounter for fracture with routine healing: Secondary | ICD-10-CM | POA: Diagnosis not present

## 2019-08-01 DIAGNOSIS — I872 Venous insufficiency (chronic) (peripheral): Secondary | ICD-10-CM | POA: Diagnosis not present

## 2019-08-01 DIAGNOSIS — G20 Parkinson's disease: Secondary | ICD-10-CM | POA: Diagnosis not present

## 2019-08-01 DIAGNOSIS — E785 Hyperlipidemia, unspecified: Secondary | ICD-10-CM | POA: Diagnosis not present

## 2019-08-01 DIAGNOSIS — Z9181 History of falling: Secondary | ICD-10-CM | POA: Diagnosis not present

## 2019-08-01 DIAGNOSIS — I509 Heart failure, unspecified: Secondary | ICD-10-CM | POA: Diagnosis not present

## 2019-08-01 DIAGNOSIS — G231 Progressive supranuclear ophthalmoplegia [Steele-Richardson-Olszewski]: Secondary | ICD-10-CM | POA: Diagnosis not present

## 2019-08-01 DIAGNOSIS — I110 Hypertensive heart disease with heart failure: Secondary | ICD-10-CM | POA: Diagnosis not present

## 2019-08-01 DIAGNOSIS — R1310 Dysphagia, unspecified: Secondary | ICD-10-CM | POA: Diagnosis not present

## 2019-08-01 DIAGNOSIS — M2011 Hallux valgus (acquired), right foot: Secondary | ICD-10-CM | POA: Diagnosis not present

## 2019-08-01 DIAGNOSIS — Z853 Personal history of malignant neoplasm of breast: Secondary | ICD-10-CM | POA: Diagnosis not present

## 2019-08-01 DIAGNOSIS — I4891 Unspecified atrial fibrillation: Secondary | ICD-10-CM | POA: Diagnosis not present

## 2019-08-17 DIAGNOSIS — K219 Gastro-esophageal reflux disease without esophagitis: Secondary | ICD-10-CM | POA: Diagnosis not present

## 2019-08-17 DIAGNOSIS — I509 Heart failure, unspecified: Secondary | ICD-10-CM | POA: Diagnosis not present

## 2019-08-17 DIAGNOSIS — Z7901 Long term (current) use of anticoagulants: Secondary | ICD-10-CM | POA: Diagnosis not present

## 2019-08-17 DIAGNOSIS — I4891 Unspecified atrial fibrillation: Secondary | ICD-10-CM | POA: Diagnosis not present

## 2019-08-17 DIAGNOSIS — E785 Hyperlipidemia, unspecified: Secondary | ICD-10-CM | POA: Diagnosis not present

## 2019-08-17 DIAGNOSIS — Z853 Personal history of malignant neoplasm of breast: Secondary | ICD-10-CM | POA: Diagnosis not present

## 2019-08-17 DIAGNOSIS — R1310 Dysphagia, unspecified: Secondary | ICD-10-CM | POA: Diagnosis not present

## 2019-08-17 DIAGNOSIS — G20 Parkinson's disease: Secondary | ICD-10-CM | POA: Diagnosis not present

## 2019-08-17 DIAGNOSIS — Z9181 History of falling: Secondary | ICD-10-CM | POA: Diagnosis not present

## 2019-08-17 DIAGNOSIS — H532 Diplopia: Secondary | ICD-10-CM | POA: Diagnosis not present

## 2019-08-17 DIAGNOSIS — M8008XD Age-related osteoporosis with current pathological fracture, vertebra(e), subsequent encounter for fracture with routine healing: Secondary | ICD-10-CM | POA: Diagnosis not present

## 2019-08-17 DIAGNOSIS — I110 Hypertensive heart disease with heart failure: Secondary | ICD-10-CM | POA: Diagnosis not present

## 2019-08-17 DIAGNOSIS — M2011 Hallux valgus (acquired), right foot: Secondary | ICD-10-CM | POA: Diagnosis not present

## 2019-08-17 DIAGNOSIS — I872 Venous insufficiency (chronic) (peripheral): Secondary | ICD-10-CM | POA: Diagnosis not present

## 2019-08-17 DIAGNOSIS — G231 Progressive supranuclear ophthalmoplegia [Steele-Richardson-Olszewski]: Secondary | ICD-10-CM | POA: Diagnosis not present

## 2019-09-05 DIAGNOSIS — I358 Other nonrheumatic aortic valve disorders: Secondary | ICD-10-CM | POA: Diagnosis not present

## 2019-09-05 DIAGNOSIS — Z7901 Long term (current) use of anticoagulants: Secondary | ICD-10-CM | POA: Diagnosis not present

## 2019-09-05 DIAGNOSIS — I4891 Unspecified atrial fibrillation: Secondary | ICD-10-CM | POA: Diagnosis not present

## 2019-09-05 DIAGNOSIS — Z23 Encounter for immunization: Secondary | ICD-10-CM | POA: Diagnosis not present

## 2019-09-09 ENCOUNTER — Emergency Department (HOSPITAL_COMMUNITY): Payer: PPO

## 2019-09-09 ENCOUNTER — Encounter (HOSPITAL_COMMUNITY): Payer: Self-pay | Admitting: Emergency Medicine

## 2019-09-09 ENCOUNTER — Emergency Department (HOSPITAL_COMMUNITY)
Admission: EM | Admit: 2019-09-09 | Discharge: 2019-09-09 | Disposition: A | Discharge status: Home / Self Care | Payer: PPO | Attending: Emergency Medicine | Admitting: Emergency Medicine

## 2019-09-09 ENCOUNTER — Other Ambulatory Visit: Payer: Self-pay

## 2019-09-09 DIAGNOSIS — N182 Chronic kidney disease, stage 2 (mild): Secondary | ICD-10-CM | POA: Diagnosis not present

## 2019-09-09 DIAGNOSIS — Y998 Other external cause status: Secondary | ICD-10-CM | POA: Diagnosis not present

## 2019-09-09 DIAGNOSIS — I129 Hypertensive chronic kidney disease with stage 1 through stage 4 chronic kidney disease, or unspecified chronic kidney disease: Secondary | ICD-10-CM | POA: Insufficient documentation

## 2019-09-09 DIAGNOSIS — S0083XA Contusion of other part of head, initial encounter: Secondary | ICD-10-CM | POA: Diagnosis not present

## 2019-09-09 DIAGNOSIS — M542 Cervicalgia: Secondary | ICD-10-CM | POA: Insufficient documentation

## 2019-09-09 DIAGNOSIS — Y9301 Activity, walking, marching and hiking: Secondary | ICD-10-CM | POA: Diagnosis not present

## 2019-09-09 DIAGNOSIS — S0181XA Laceration without foreign body of other part of head, initial encounter: Secondary | ICD-10-CM

## 2019-09-09 DIAGNOSIS — Y92009 Unspecified place in unspecified non-institutional (private) residence as the place of occurrence of the external cause: Secondary | ICD-10-CM | POA: Insufficient documentation

## 2019-09-09 DIAGNOSIS — W010XXA Fall on same level from slipping, tripping and stumbling without subsequent striking against object, initial encounter: Secondary | ICD-10-CM | POA: Diagnosis not present

## 2019-09-09 DIAGNOSIS — S199XXA Unspecified injury of neck, initial encounter: Secondary | ICD-10-CM | POA: Diagnosis not present

## 2019-09-09 DIAGNOSIS — Z79899 Other long term (current) drug therapy: Secondary | ICD-10-CM | POA: Insufficient documentation

## 2019-09-09 DIAGNOSIS — S0990XA Unspecified injury of head, initial encounter: Secondary | ICD-10-CM | POA: Diagnosis not present

## 2019-09-09 DIAGNOSIS — R001 Bradycardia, unspecified: Secondary | ICD-10-CM | POA: Diagnosis not present

## 2019-09-09 LAB — CBC WITH DIFFERENTIAL/PLATELET
Abs Immature Granulocytes: 0.01 10*3/uL (ref 0.00–0.07)
Basophils Absolute: 0 10*3/uL (ref 0.0–0.1)
Basophils Relative: 1 %
Eosinophils Absolute: 0.1 10*3/uL (ref 0.0–0.5)
Eosinophils Relative: 2 %
HCT: 37.8 % (ref 36.0–46.0)
Hemoglobin: 12.7 g/dL (ref 12.0–15.0)
Immature Granulocytes: 0 %
Lymphocytes Relative: 34 %
Lymphs Abs: 1.6 10*3/uL (ref 0.7–4.0)
MCH: 35.7 pg — ABNORMAL HIGH (ref 26.0–34.0)
MCHC: 33.6 g/dL (ref 30.0–36.0)
MCV: 106.2 fL — ABNORMAL HIGH (ref 80.0–100.0)
Monocytes Absolute: 0.4 10*3/uL (ref 0.1–1.0)
Monocytes Relative: 8 %
Neutro Abs: 2.7 10*3/uL (ref 1.7–7.7)
Neutrophils Relative %: 55 %
Platelets: 194 10*3/uL (ref 150–400)
RBC: 3.56 MIL/uL — ABNORMAL LOW (ref 3.87–5.11)
RDW: 13.2 % (ref 11.5–15.5)
WBC: 4.8 10*3/uL (ref 4.0–10.5)
nRBC: 0 % (ref 0.0–0.2)

## 2019-09-09 LAB — BASIC METABOLIC PANEL
Anion gap: 9 (ref 5–15)
BUN: 23 mg/dL (ref 8–23)
CO2: 28 mmol/L (ref 22–32)
Calcium: 9.8 mg/dL (ref 8.9–10.3)
Chloride: 103 mmol/L (ref 98–111)
Creatinine, Ser: 1.46 mg/dL — ABNORMAL HIGH (ref 0.44–1.00)
GFR calc Af Amer: 39 mL/min — ABNORMAL LOW (ref >60)
GFR calc non Af Amer: 34 mL/min — ABNORMAL LOW (ref >60)
Glucose, Bld: 94 mg/dL (ref 70–99)
Potassium: 4.7 mmol/L (ref 3.5–5.1)
Sodium: 140 mmol/L (ref 135–145)

## 2019-09-09 LAB — PROTIME-INR
INR: 2.5 — ABNORMAL HIGH (ref 0.8–1.2)
Prothrombin Time: 26.7 seconds — ABNORMAL HIGH (ref 11.4–15.2)

## 2019-09-09 MED ORDER — LIDOCAINE-EPINEPHRINE (PF) 2 %-1:200000 IJ SOLN
10.0000 mL | Freq: Once | INTRAMUSCULAR | Status: AC
  Administered 2019-09-09: 10 mL
  Filled 2019-09-09: qty 20

## 2019-09-09 NOTE — ED Provider Notes (Signed)
[facility] EMERGENCY DEPARTMENT Provider Note   CSN: [id number] Arrival date & time: 09/09/19  1552     History   Chief Complaint Chief Complaint  Patient presents with   Fall    on blood thinners   Head Laceration    HPI [name] is a 78 y.o. female.     HPI   78 year old female presenting after fall. "I tripped over my feet." Struck head. No LOC. R forehead laceration and localized pain there. Denies pain elsewhere. L facial ecchymosis form fall several days ago. No acute neuro complaints. Husband at bedside and says baseline.   Past Medical History:  Diagnosis Date   Arthropathy of cervical facet joint    C2-3   At risk for falls    BPV (benign positional vertigo)    Cancer ([place]) 2004    breast-lumpectomy,nodes ,radiation,chemo   Chronic neck pain    CKD (chronic kidney disease), stage II    Diverticulosis of colon    Elevated troponin    a. 08/2016 in setting of PAF;  b. 08/2016 low risk MV w/ fixed septal defect (LBBB), EF 53%.   GERD (gastroesophageal reflux disease)    Hiatal hernia    History of aortic valve stenosis    History of breast cancer per pt no recurrence   dx 2004-- Left breast DCIS (ER/PR+, HER2 negative) s/p  partial masectomy w/ sln bx and  chemoradiotion (completed 2004)   History of colon polyps    History of herpes zoster    History of kidney stones    HTN (hypertension)    Hyperlipidemia    Hypertrophy of inter-atrial septum    a. 09/2014 Echo: ? of atrial mass; b. 10/2014 Cardiac MRI: no atrial septal mass - polypoid lipomatous hypertrophy of superior atrial septum noted-->Benign.   Irritable bowel syndrome    LBBB (left bundle branch block)    PAF (paroxysmal atrial fibrillation) (HCC)    a. CHA2DS2VASc = 4-->coumadin.   Parkinsonism [facility])    neurologist-  dr tat   Personal history of chemotherapy 2004   Personal history of radiation therapy 2004   PONV (postoperative nausea  and vomiting)    PVC's (premature ventricular contractions)    Right ureteral stone    S/P aortic valve replacement with prosthetic valve    a. 04/13/2006 s/p St Jude mechnical AVR for severe AS;  b. 09/2014 Echo: EF 40-45%, gr1 DD, mild AI/MR, triv TR/PR.   SUI (stress urinary incontinence, female)    Symptomatic anemia    Venous varices      Patient Active Problem List   Diagnosis Date Noted   Atrial flutter ([place])    Heme positive stool    Symptomatic anemia 12/05/2018   GI bleed 12/05/2018   CKD (chronic kidney disease), stage III ([place]) 12/05/2018   Elevated d-dimer 12/05/2018   SOB (shortness of breath) 12/05/2018   Supratherapeutic INR 12/05/2018   Parkinsonism ([place])    Elevated troponin 09/19/2016   Emesis 09/19/2016   UTI (urinary tract infection) 09/19/2016   Sinus bradycardia 09/16/2016   Family history of colon cancer 07/30/2014   Hiatal hernia 07/30/2014   Chest pain 10/13/2011   Disequilibrium 10/13/2011   Heart valve replaced by other means 04/16/2011   Chronic anticoagulation 03/17/2011   S/P AVR (aortic valve replacement)    PVC's (premature ventricular contractions)    HTN (hypertension)    Hypercholesteremia    Atrial fibrillation with RVR ([place])    GERD (  gastroesophageal reflux disease)    GERD 02/06/2010   DIVERTICULOSIS-COLON 02/06/2010   PERSONAL HX COLONIC POLYPS 02/06/2010   MASTECTOMY, HX OF 02/06/2010    Past Surgical History:  Procedure Laterality Date   ANTERIOR AND POSTERIOR REPAIR  1990   cystocele/ rectocele   AORTIC VALVE REPLACEMENT  04/13/06   dr gerhardt   and Replacement Ascending Aorta (St. Jude mechanical prosthesis)   APPENDECTOMY  child 1950   BREAST LUMPECTOMY Left 01/11/2003   malignant   BREAST SURGERY Left 2004   CARDIAC CATHETERIZATION  02-22-2001   dr Martinique   minimal non-obstructive cad/  mild aortic stenosis and aortic root enlargement/  normal LVF, ef 65%   CARDIAC  CATHETERIZATION  03-22-2006    dr Martinique   no significant obstructive cad/  severe aortic stenosis and mild to moderate aortic root enlargement/  upper normal right heart pressures   CARDIOVASCULAR STRESS TEST  09-30-2011   dr Martinique   lexiscan nuclear study w/ apical thinning  vs  small prior apical infarct w/ no significant ischemia/  hypokinesis of the distal septum and apex, ef 50%   CARPAL TUNNEL RELEASE Bilateral left 09-09-2004/  right 2007   CATARACT EXTRACTION W/ INTRAOCULAR LENS  IMPLANT, BILATERAL  2015   COLONOSCOPY N/A 10/03/2014   Procedure: COLONOSCOPY;  Surgeon: [name], MD;  Location: [facility];  Service: Endoscopy;  Laterality: N/A;   CYSTOSCOPY WITH RETROGRADE PYELOGRAM, URETEROSCOPY AND STENT PLACEMENT Right 09/17/2016   Procedure: CYSTOSCOPY WITH RIGHT RETROGRADE PYELOGRAM, RIGHT URETEROSCOPY AND STENT PLACEMENT LASER LITHOTRIPSY, RIGHT STENT PLACEMENT;  Surgeon: [name], MD;  Location: [facility];  Service: Urology;  Laterality: Right;   ESOPHAGEAL MANOMETRY N/A 08/12/2014   Procedure: ESOPHAGEAL MANOMETRY (EM);  Surgeon: [name], MD;  Location: [facility];  Service: Endoscopy;  Laterality: N/A;   ESOPHAGOGASTRODUODENOSCOPY N/A 10/03/2014   Procedure: ESOPHAGOGASTRODUODENOSCOPY (EGD);  Surgeon: [name], MD;  Location: [facility] ENDOSCOPY;  Service: Endoscopy;  Laterality: N/A;   ESOPHAGOGASTRODUODENOSCOPY N/A 12/08/2018   Procedure: ESOPHAGOGASTRODUODENOSCOPY (EGD);  Surgeon: Doran Stabler, MD;  Location: [facility];  Service: Gastroenterology;  Laterality: N/A;   EXTRACORPOREAL SHOCK WAVE LITHOTRIPSY  1997   FOOT SURGERY Right 2013   hammertoe x2   HOLMIUM LASER APPLICATION Right 4/74/2595   Procedure: HOLMIUM LASER APPLICATION;  Surgeon: [name], MD;  Location: [facility];  Service: Urology;  Laterality: Right;   PERCUTANEOUS NEPHROSTOLITHOTOMY  1993   STONE EXTRACTION WITH BASKET  Right 09/17/2016   Procedure: STONE EXTRACTION WITH BASKET;  Surgeon: [name], MD;  Location: [facility];  Service: Urology;  Laterality: Right;   TOTAL ABDOMINAL HYSTERECTOMY  1971   w/ Bilateral Salpingoophorectomy   TRANSTHORACIC ECHOCARDIOGRAM  10/24/2014   dr Martinique   hypokinesis of the basal and mid inferoseptal and inferior walls,  ef 40-45%,  grade 1 diastolic dysfunction/  mechanical bileaflet aortic valve noted w/ mild regur., peak grandient 63mHg, valve area 1.35cm^2/  severe MV calcification without stenosis w/ mild regurg., peak gradient 352mg/  mild LAE/  poss. atrial mass (MRI showed benign lipomatous hypertrophy of atrial septum) /tFrazier ButtR/mild TR     OB History   No obstetric history on file.      Home Medications    Prior to Admission medications   Medication Sig Start Date End Date Taking? Authorizing Provider  acetaminophen (TYLENOL) 500 MG tablet Take 1,000 mg by mouth every 6 (six) hours as needed (  head pain).     [provider]  amiodarone (PACERONE) 200 MG tablet Take 1 tablet by mouth once daily 06/26/19   Evans Lance, MD  calcium carbonate (TUMS - DOSED IN MG ELEMENTAL CALCIUM) 500 MG chewable tablet Chew 1 tablet by mouth daily.    [provider]  calcium citrate-vitamin D (CITRACAL+D) 315-200 MG-UNIT per tablet Take 1 tablet by mouth 3 (three) times daily.    [provider]  carbidopa-levodopa (SINEMET IR) 25-100 MG tablet TAKE 2 TABLETS BY MOUTH THREE TIMES DAILY 06/25/19   Tat, Eustace Quail, DO  Cholecalciferol (VITAMIN D3) 2000 units TABS Take 1 capsule by mouth daily.    [provider]  famotidine-calcium carbonate-magnesium hydroxide (PEPCID COMPLETE) 10-800-165 MG CHEW chewable tablet Chew 1 tablet by mouth daily as needed (heartburn).    [provider]  KLOR-CON M20 20 MEQ tablet Take 20 mEq by mouth daily. 05/26/17   [provider]  KRILL OIL PO Take 1 capsule by  mouth daily.     [provider]  Melatonin 3 MG TABS Take 3 mg by mouth at bedtime as needed (sleep).     [provider]  metoprolol tartrate (LOPRESSOR) 25 MG tablet Take 1 tablet (25 mg total) by mouth daily as needed (if heart rate is above 110). 01/15/19 04/15/19  Martinique, Peter M, MD  Multiple Vitamin (MULTIVITAMIN) tablet Take 1 tablet by mouth daily at 12 noon.     [provider]  Multiple Vitamins-Minerals (VISION-VITE PRESERVE PO) Take 1 tablet by mouth 2 (two) times daily.    [provider]  pantoprazole (PROTONIX) 40 MG tablet Take 40 mg by mouth daily. 06/07/17   [provider]  Probiotic Product (PROBIOTIC-10 PO) Take 1 capsule by mouth daily.     [provider]  simvastatin (ZOCOR) 20 MG tablet Take 20 mg by mouth every evening.    [provider]  vitamin B-12 (CYANOCOBALAMIN) 1000 MCG tablet Take 1,000 mcg by mouth every morning.    [provider]  warfarin (COUMADIN) 5 MG tablet Take 5 mg by mouth. Take 48m S, T, W, TH and 2.5 M, F  by mouth every day    [provider]    Family History Family History  Problem Relation Age of Onset   Heart disease Mother    Heart disease Father 781      CABG   Other Brother        AVR    Social History Social History   Tobacco Use   Smoking status: Never Smoker   Smokeless tobacco: Never Used  Substance Use Topics   Alcohol use: No    Alcohol/week: 0.0 standard drinks   Drug use: No     Allergies   Demerol [meperidine], Oxycodone, Adhesive [tape], Ivp dye [iodinated diagnostic agents], and Phenergan [promethazine]   Review of Systems Review of Systems  All systems reviewed and negative, other than as noted in HPI.  Physical Exam Updated Vital Signs BP (!) 143/55    Pulse (!) 48    Temp 99.3 F (37.4 C) (Oral)    Resp 19    SpO2 96%   Physical Exam Vitals signs and nursing note reviewed.  Constitutional:      General: She is  not in acute distress.    Appearance: She is well-developed.  HENT:     Head: Normocephalic.     Comments: 1.5 cm laceration right forehead.  Minimal bleeding. Eyes:  General:        Right eye: No discharge.        Left eye: No discharge.     Conjunctiva/sclera: Conjunctivae normal.  Neck:     Musculoskeletal: Neck supple.  Cardiovascular:     Rate and Rhythm: Normal rate and regular rhythm.     Heart sounds: Normal heart sounds. No murmur. No friction rub. No gallop.   Pulmonary:     Effort: Pulmonary effort is normal. No respiratory distress.     Breath sounds: Normal breath sounds.  Abdominal:     General: There is no distension.     Palpations: Abdomen is soft.     Tenderness: There is no abdominal tenderness.  Musculoskeletal:        General: No tenderness.  Skin:    General: Skin is warm and dry.  Neurological:     Mental Status: She is alert.     Comments: Somewhat slow to respond to questioning but does so appropriately.  Following commands.  No focal deficits.  Psychiatric:        Behavior: Behavior normal.        Thought Content: Thought content normal.      ED Treatments / Results  Labs (all labs ordered are listed, but only abnormal results are displayed) Labs Reviewed  CBC WITH DIFFERENTIAL/PLATELET - Abnormal; Notable for the following components:      Result Value   RBC 3.56 (*)    MCV 106.2 (*)    MCH 35.7 (*)    All other components within normal limits  PROTIME-INR  BASIC METABOLIC PANEL    EKG EKG Interpretation  Date/Time:  Sunday September 09 2019 16:18:09 EDT Ventricular Rate:  54 PR Interval:  204 QRS Duration: 138 QT Interval:  508 QTC Calculation: 481 R Axis:   170 Text Interpretation:  Sinus bradycardia Right axis deviation Non-specific intra-ventricular conduction block Cannot rule out Anteroseptal infarct , age undetermined Abnormal ECG Confirmed by Karam Dunson ([id number]) on 09/09/2019 5:06:34 PM   Radiology No results  found.  Procedures Procedures (including critical care time) LACERATION REPAIR Performed by: Zev Blue Authorized by: Marlaina Coburn Consent: Verbal consent obtained. Risks and benefits: risks, benefits and alternatives were discussed Consent given by: patient Patient identity confirmed: provided demographic data Prepped and Draped in normal sterile fashion Wound explored  Laceration Location: Forehead  Laceration Length: 1.5cm  No Foreign Bodies seen or palpated  Anesthesia: local infiltration  Local anesthetic: lidocaine 2% w epinephrine  Anesthetic total:1 ml  Irrigation method: syringe Amount of cleaning: standard  Skin closure: Single layer using 5-0 plain gut in a simple interrupted fashion.  3 sutures placed.  Patient tolerance: Patient tolerated the procedure well with no immediate complications.   Medications Ordered in ED Medications  lidocaine-EPINEPHrine (XYLOCAINE W/EPI) 2 %-1:200000 (PF) injection 10 mL (has no administration in time range)     Initial Impression / Assessment and Plan / ED Course  I have reviewed the triage vital signs and the nursing notes.  Pertinent labs & imaging results that were available during my care of the patient were reviewed by me and considered in my medical decision making (see chart for details).       78  year old female with a mechanical fall while on warfarin.  Baseline neuro exam.  INR is 2.5 which is at the low level of her desired range given her heart valve replacement.  CT imaging without serious acute traumatic injury.  Forehead laceration was closed.  Continued  wound care and head injury instructions were discussed.  Final Clinical Impressions(s) / ED Diagnoses   Final diagnoses:  Facial laceration, initial encounter  Contusion of face, initial encounter    ED Discharge Orders    None       Virgel Manifold, MD 09/11/19 [phone]

## 2019-09-09 NOTE — ED Notes (Signed)
Patient transported to CT 

## 2019-09-09 NOTE — ED Triage Notes (Addendum)
Pt states she fell approx 1 hour ago.  Approx 1/2 inch laceration to R side of forehead. Bleeding controlled. Pt has ecchymosis to L side of face from previous fall.  Denies LOC.  States she doesn't know why she fell.  Denies dizziness.  Denies pain.  Pt on blood thinners.

## 2019-09-09 NOTE — ED Notes (Signed)
Patient verbalizes understanding of discharge instructions. Opportunity for questioning and answers were provided. Armband removed by staff, pt discharged from ED via wheelchair to home.  

## 2019-09-09 NOTE — Discharge Instructions (Addendum)
Your stitches will come out themselves in the next few days to about a week. Bathe your wound gently. Do not scrub or soak. Your INR today was 2.5. Your hemoglobin today was normal. This is actually higher than you have been on recent labs.

## 2019-09-21 ENCOUNTER — Ambulatory Visit: Payer: PPO | Admitting: Neurology

## 2019-09-27 NOTE — Progress Notes (Addendum)
[name] was seen today in the movement disorders clinic for neurologic consultation at the request of Dr Tomi Likens for a 2nd opinion regarding gait instability.  She is accompanied by her husband who supplements the hx.  The records that were made available to me were reviewed.  Pt reports that she has been having falls, the first of which was several years ago but then she began to have more regular falls 6-8 months ago.  Most falls are when she turns and she notes that the feet will shuffle and the "body will turn and the feet will stay."  Her husband states that her feet will stay like "they are stuck on bumble gum."  No falls in last 2 weeks.  She has been in rehab recently.    07/06/16 update:  The patient is following up today regarding probable PSP.  Went to the atypical symposium I had recommended and they thought that it was very informative.  She had a modified barium swallow on 05/20/2016 that recommended a regular diet with thin liquids.  She has been participating with physical and occupational therapy since last visit.  I started her on levodopa (6am/12noon/5pm) and she has not noticed a great difference with that. No SE.  Has had 2 falls.  With one she tripped over her cane and with one she fell changing her sheets.   This was very low dose, and a benefit was not expected at this low dose.  She did see Dr. Rexene Alberts in 06/22/2016 for sleep apnea.  A sleep study was recommended.  Pt states that she hasn't scheduled that yet.  States that she had an injection in neck for pain 2 weeks ago and thinks that it has helped some.  States that she is contemplating cervical spine surgery.  She did have an MRI of the cervical spine on 06/03/2016 that demonstrated severe C2-C3 left facet arthropathy with joint effusion and bone marrow edema.  11/23/16 update:  Pt f/u today.  Last visit, I increased her carbidopa/levodopa 25/100 to 2 po tid.  She states that she noted no great difference with that.   Multiple falls over the last few weeks (5) for no reason.  One time she tripped over a threshold.  One time she fell in a bathroom.  One time she was working with the christmas tree.  All the falls except one were forward.   Unfortunately, the PSP study that was supposed to be done local had a site change and the closest site was [place], [place].  They also were told that she wasn't a good candidate because of the coumadin.  Had 2 kidney stones since last visit.  On oxybutinin.  Seeing urology this afternoon.  04/07/17 update:  Patient follows up today.  She is on carbidopa/levodopa 25/100, 2 tablets 3 times per day.  She has had 2   falls since our last visit.  She was in the emergency room on 03/19/2017 after she fell backward and hit her head.  She was stepping backwards to put trash in the trashcan.  She had the walker but was going backward.  I reviewed the emergency room notes.  I also reviewed her CT of the brain which was done that day.  It was unremarkable and demonstrated chronic white matter disease.  The other fall was in the bathroom and she was turning around to look at her hair.  She hit her bottom.  She has attended speech, occupational and physical therapy  since our last visit and those notes were reviewed.  She has had 1 day of lightheadedness but no near syncope.  She states that her mood has been good.  She is sleeping well.  She has had some intermittent diplopia.  08/11/17 update:  Patient seen in follow-up today for her atypical parkinsonism.  This patient is accompanied in the office by her spouse who supplements the history.  Patient is on carbidopa/levodopa 25/100, 2 tablets 3 times per day.  She has had one fall since last visit.  I have reviewed records made available to me.  She was in the emergency room on June 8 with a fall.  The fall actually happened a week and a half prior to her presentation to the emergency room.  She was trying to back up and fell over the walker.  She had a  CT of the brain and cervical spine.  With the exception of a posterior scalp hematoma, it was otherwise unremarkable.   She was given a RX for metoclopramide and husband asks me about why that is.  They didn't get it filled because they read it shouldn't be taken if patient has parkinsonism.  Husband states she had no nausea.  Using walker and no other falls besides one described.  They changed to a lower bed because was having trouble getting up on other one.  She continues to have some intermittent diplopia.  Since our last visit, the patient did see her urologist and she states that she had a kidney stone removed.  01/12/18 update: Patient is seen today in follow-up for atypical parkinsonism.  She is accompanied by her husband who supplements history.  She is on carbidopa/levodopa 25/100, 2 tablets 3 times per day.  I have reviewed records made available to me since last visit.  She was in the emergency room on December 26, 2017 after a fall.  She was trying to get something out of the refrigerator and tripped over her walker and fell and hit her head.  CT of the brain was completed and was nonacute.  No other falls besides yesterday.  No issues with swallowing.  No diplopia.  She is doing the bike for exercise but only 5 min at a time, twice per day.  Her cardiology notes from Dr. Martinique were reviewed as well.  06/15/18 update: Patient is seen today in follow-up for atypical parkinsonism.  She is accompanied by her husband who supplements history.  Patient is on carbidopa/levodopa 25/100, 2 tablets 3 times per day.  Records have been reviewed since our last visit.  She was in the ER in February after a fall.  The patient let go of her walker to get her purse off of her shoulder and ended up falling backwards.  She hit her head on the door frame.  CT of the brain was performed and reviewed personally.  Intracranially, it was nonacute.  There was a moderate to large left parietal scalp hematoma.  shes had a few  other falls.  She was opening a door a week ago and it came back on her and she fell.  Yesterday she had a "little" fall due to what is described as festination.  Occasionally having some diplopia but still able to read.  Has eye appt next month, Dr. Valetta Close.  Some trouble swallowing the bigger pills.  Has a dry cough, independent of eating/drinking.    11/20/18 update: Patient seen today in follow-up for atypical parkinsonism, accompanied by her husband who  supplements history.  Patient is on carbidopa/levodopa 25/100, 2 tablets 3 times per day.  She has had 1 fall per week.  She hasn't gotten hurt.  Sometimes she has the walker and sometimes not.  Most falls have been in the kitchen.  She has had some diplopia.  Just got new glasses.  She had a modified barium swallow completed on June 23, 2018.  This was normal.  Still having coughing that she thinks is related to hiatal hernia.  Cardiology records from October were reviewed from Dr. Martinique.  She was in atrial fibrillation at the time, but rate was well controlled.  No new medications were started.  She is on Coumadin.  10/01/19 update: Patient seen today in follow-up for PSP, accompanied by her husband who supplements history, and for WC evaluation.  I last saw the patient through telemedicine in April.  She is on carbidopa/levodopa 25/100, 2 tablets 3 times per day.  When I saw her last visit, I told the patient that I needed her to stay seated at all times in a wheelchair, unless her husband was helping her transfer.  Unfortunately, she has not stayed seated completely and had a fall that produced a ER visit on June 26, 2019.  I have had the opportunity to review this.  She was walking and started coughing and lost her balance and fell.  This was complicated by the fact that the patient's INR was elevated.  CT of the head was done and was negative.  She was back in the emergency room on September 09, 2019 for another fall.  This time, she stained a forehead  laceration, which required sutures.  CT of the brain was again performed and was unremarkable.  These were not the only falls.  Swallowing is some trouble, but mostly with the large K pill.  Some diplopia.    PREVIOUS MEDICATIONS: none to date  ALLERGIES:   Allergies  Allergen Reactions   Demerol [Meperidine] Shortness Of Breath and Swelling   Oxycodone Other (See Comments)    Hallucinations    Adhesive [Tape] Other (See Comments)    Redness and skin tears   Ivp Dye [Iodinated Diagnostic Agents] Hives    OK with benadryl pre-med (32m one hour before receiving iodinated contrast agent)   Phenergan [Promethazine] Other (See Comments)    Avoids due to reaction with current medications    CURRENT MEDICATIONS:  Outpatient Encounter Medications as of 10/01/2019  Medication Sig   acetaminophen (TYLENOL) 500 MG tablet Take 1,000 mg by mouth every 6 (six) hours as needed (head pain).    amiodarone (PACERONE) 200 MG tablet Take 1 tablet by mouth once daily   calcium carbonate (TUMS - DOSED IN MG ELEMENTAL CALCIUM) 500 MG chewable tablet Chew 1 tablet by mouth daily.   calcium citrate-vitamin D (CITRACAL+D) 315-200 MG-UNIT per tablet Take 1 tablet by mouth 3 (three) times daily.   carbidopa-levodopa (SINEMET IR) 25-100 MG tablet TAKE 2 TABLETS BY MOUTH THREE TIMES DAILY   Cholecalciferol (VITAMIN D3) 2000 units TABS Take 1 capsule by mouth daily.   famotidine-calcium carbonate-magnesium hydroxide (PEPCID COMPLETE) 10-800-165 MG CHEW chewable tablet Chew 1 tablet by mouth daily as needed (heartburn).   KLOR-CON M20 20 MEQ tablet Take 20 mEq by mouth daily.   KRILL OIL PO Take 1 capsule by mouth daily.    Melatonin 3 MG TABS Take 3 mg by mouth at bedtime as needed (sleep).    metoprolol tartrate (LOPRESSOR) 25 MG tablet Take  25 mg as needed if heart rate greater than 110   Multiple Vitamin (MULTIVITAMIN) tablet Take 1 tablet by mouth daily at 12 noon.    Multiple  Vitamins-Minerals (VISION-VITE PRESERVE PO) Take 1 tablet by mouth 2 (two) times daily.   pantoprazole (PROTONIX) 40 MG tablet Take 40 mg by mouth daily.   Probiotic Product (PROBIOTIC-10 PO) Take 1 capsule by mouth daily.    simvastatin (ZOCOR) 20 MG tablet Take 20 mg by mouth every evening.   vitamin B-12 (CYANOCOBALAMIN) 1000 MCG tablet Take 1,000 mcg by mouth every morning.   warfarin (COUMADIN) 5 MG tablet Take 5 mg by mouth. Take 33m S, T, W, TH and 2.5 M, F  by mouth every day   metoprolol tartrate (LOPRESSOR) 25 MG tablet Take 1 tablet (25 mg total) by mouth daily as needed (if heart rate is above 110).   No facility-administered encounter medications on file as of 10/01/2019.     PAST MEDICAL HISTORY:   Past Medical History:  Diagnosis Date   Arthropathy of cervical facet joint    C2-3   At risk for falls    BPV (benign positional vertigo)    [place]    breast-lumpectomy,nodes ,radiation,chemo   Chronic neck pain    CKD (chronic kidney disease), stage II    Diverticulosis of colon    Elevated troponin    a. 08/2016 in setting of PAF;  b. 08/2016 low risk MV w/ fixed septal defect (LBBB), EF 53%.   GERD (gastroesophageal reflux disease)    Hiatal hernia    History of aortic valve stenosis    History of breast cancer per pt no recurrence   dx 2004-- Left breast DCIS (ER/PR+, HER2 negative) s/p  partial masectomy w/ sln bx and  chemoradiotion (completed 2004)   History of colon polyps    History of herpes zoster    History of kidney stones    HTN (hypertension)    Hyperlipidemia    Hypertrophy of inter-atrial septum    a. 09/2014 Echo: ? of atrial mass; b. 10/2014 Cardiac MRI: no atrial septal mass - polypoid lipomatous hypertrophy of superior atrial septum noted-->Benign.   Irritable bowel syndrome    LBBB (left bundle branch block)    PAF (paroxysmal atrial fibrillation) (HCC)    a. CHA2DS2VASc = 4-->coumadin.   [facility] ([facility]      neurologist-  dr Cleotha Whalin   Personal history of chemotherapy 2004   Personal history of radiation therapy 2004   PONV (postoperative nausea and vomiting)    PVC's (premature ventricular contractions)    Right ureteral stone    S/P aortic valve replacement with prosthetic valve    a. 04/13/2006 s/p St Jude mechnical AVR for severe AS;  b. 09/2014 Echo: EF 40-45%, gr1 DD, mild AI/MR, triv TR/PR.   SUI (stress urinary incontinence, female)    Symptomatic anemia    Venous varices      PAST SURGICAL HISTORY:   Past Surgical History:  Procedure Laterality Date   ANTERIOR AND POSTERIOR REPAIR  1990   cystocele/ rectocele   AORTIC VALVE REPLACEMENT  04/13/06   dr gerhardt   and Replacement Ascending Aorta (St. Jude mechanical prosthesis)   APPENDECTOMY  child 1950   BREAST LUMPECTOMY Left 01/11/2003   malignant   BREAST SURGERY Left 2004   CARDIAC CATHETERIZATION  02-22-2001   dr jMartinique  minimal non-obstructive cad/  mild aortic stenosis and aortic root enlargement/  normal LVF, ef 65%  CARDIAC CATHETERIZATION  03-22-2006    dr Martinique   no significant obstructive cad/  severe aortic stenosis and mild to moderate aortic root enlargement/  upper normal right heart pressures   CARDIOVASCULAR STRESS TEST  09-30-2011   dr Martinique   lexiscan nuclear study w/ apical thinning  vs  small prior apical infarct w/ no significant ischemia/  hypokinesis of the distal septum and apex, ef 50%   CARPAL TUNNEL RELEASE Bilateral left 09-09-2004/  right 2007   CATARACT EXTRACTION W/ INTRAOCULAR LENS  IMPLANT, BILATERAL  2015   COLONOSCOPY N/A 10/03/2014   Procedure: COLONOSCOPY;  Surgeon: [name], MD;  Location: [place];  Service: Endoscopy;  Laterality: N/A;   CYSTOSCOPY WITH RETROGRADE PYELOGRAM, URETEROSCOPY AND STENT PLACEMENT Right 09/17/2016   Procedure: CYSTOSCOPY WITH RIGHT RETROGRADE PYELOGRAM, RIGHT URETEROSCOPY AND STENT PLACEMENT LASER LITHOTRIPSY, RIGHT STENT  PLACEMENT;  Surgeon: [name], MD;  Location: [facility];  Service: Urology;  Laterality: Right;   ESOPHAGEAL MANOMETRY N/A 08/12/2014   Procedure: ESOPHAGEAL MANOMETRY (EM);  Surgeon: [name], MD;  Location: [facility];  Service: Endoscopy;  Laterality: N/A;   ESOPHAGOGASTRODUODENOSCOPY N/A 10/03/2014   Procedure: ESOPHAGOGASTRODUODENOSCOPY (EGD);  Surgeon: [name], MD;  Location[facility];  Service: Endoscopy;  Laterality: N/A;   ESOPHAGOGASTRODUODENOSCOPY N/A 12/08/2018   Procedure: ESOPHAGOGASTRODUODENOSCOPY (EGD);  Surgeon: Doran Stabler, MD;  Location: [place];  Service: Gastroenterology;  Laterality: N/A;   EXTRACORPOREAL SHOCK WAVE LITHOTRIPSY  1997   FOOT SURGERY Right 2013   hammertoe x2   HOLMIUM LASER APPLICATION Right 9/56/3875   Procedure: HOLMIUM LASER APPLICATION;  Surgeon: [name], MD;  Location: [facility];  Service: Urology;  Laterality: Right;   PERCUTANEOUS NEPHROSTOLITHOTOMY  1993   STONE EXTRACTION WITH BASKET Right 09/17/2016   Procedure: STONE EXTRACTION WITH BASKET;  Surgeon: [name], MD;  Location[facility];  Service: Urology;  Laterality: Right;   TOTAL ABDOMINAL HYSTERECTOMY  1971   w/ Bilateral Salpingoophorectomy   TRANSTHORACIC ECHOCARDIOGRAM  10/24/2014   dr Martinique   hypokinesis of the basal and mid inferoseptal and inferior walls,  ef 40-45%,  grade 1 diastolic dysfunction/  mechanical bileaflet aortic valve noted w/ mild regur., peak grandient 35mHg, valve area 1.35cm^2/  severe MV calcification without stenosis w/ mild regurg., peak gradient 368mg/  mild LAE/  poss. atrial mass (MRI showed benign lipomatous hypertrophy of atrial septum) /tFrazier ButtR/mild TR    SOCIAL HISTORY:   Social History   Socioeconomic History   Marital status: Married    Spouse name: Not on file   Number of children: 3   Years of education: 11   Highest  education level: 11th grade  Occupational History   Occupation: baPress photographerOTHER    Comment: retired  [employer], product/process developmenttrain: Not on file   Food insecurity    Worry: Not on file    Inability: Not on fiLexicographereeds    Medical: Not on file    Non-medical: Not on file  Tobacco Use   Smoking status: Never Smoker   Smokeless tobacco: Never Used  Substance and Sexual Activity   Alcohol use: No    Alcohol/week: 0.0 standard drinks   Drug use: No   Sexual activity: Not on file  Lifestyle   Physical activity    Days per week: Not on file    Minutes per session:  Not on file   Stress: Not on file  Relationships   Social connections    Talks on phone: Not on file    Gets together: Not on file    Attends religious service: Not on file    Active member of club or organization: Not on file    Attends meetings of clubs or organizations: Not on file    Relationship status: Not on file   Intimate partner violence    Fear of current or ex partner: Not on file    Emotionally abused: Not on file    Physically abused: Not on file    Forced sexual activity: Not on file  Other Topics Concern   Not on file  Social History Narrative      1/2 -1 soda a day      FAMILY HISTORY:   Family Status  Relation Name Status   Mother  Deceased at age 81   Father  Deceased at age 65   Brother  Deceased       colon cancer   Brother  Deceased       accident   Brother  Deceased       accident   Engineer, materials       colon cancer   Brother  Other   Sister  Alive   Sister  Alive   Sister  Alive   MGM  Deceased   MGF  Deceased   [place]  Deceased   PGF  Deceased   Brother  (Not Specified)   Child x3 Alive    ROS:  ROS  PHYSICAL EXAMINATION:    VITALS:   Vitals:   10/01/19 0832  BP: 133/73  Pulse: 67  SpO2: 97%  Weight: 129 lb 3.2 oz (58.6 kg)  Height: _0  (1.499 m)    GEN:  The patient appears stated  age and is in NAD. HEENT:  Normocephalic, atraumatic.  The mucous membranes are moist. The superficial temporal arteries are without ropiness or tenderness. CV:  RRR Lungs:  CTAB Neck/HEME:  There are no carotid bruits bilaterally.  Neck is turned to the left (same as previous) Skin:  She has a healing area of ecchymosis on the L cheek  Neurological examination:  Orientation: The patient is alert and oriented x3.  Cranial nerves: There is mild R facial droop at rest but able to activate the muscles well.  There is facial hypomimia.    Extraocular muscles are intact with mild upgaze/downgaze paresis. No square wave jerks.  The visual fields are full to confrontational testing. The speech is fluent and mildly dysarthric. She is hypophonic.  Minimal trouble with gutteral sounds.  Soft palate rises symmetrically and there is no tongue deviation. Hearing is intact to conversational tone. Sensation: Sensation is intact to light touch throughout. Motor: Strength is 5/5 in the bilateral upper and lower extremities.   Shoulder shrug is equal and symmetric.  There is no pronator drift.   Movement examination: Tone: There is normal tone in the bilateral upper extremities.  The tone in the lower extremities is normal.  Abnormal movements: no tremor today.   Coordination:  There is decremation, with any form of RAMS, including alternating supination and pronation of the forearm, hand opening and closing, finger taps, heel taps and toe taps, more in the LE than UE Gait and Station: The patient pushes off of the chair. The patient is shuffling.  She has trouble in the turns and would have fallen  had the examiner not been there.  She has a cane.  ASSESSMENT/PLAN:  1.  Probable PSP  -I do not think that the patient has idiopathic Parkinsons Disease but rather one of the atypical parkinsonian states, and possibly Progressive Supranuclear Palsy (PSP).  We talked about nature, etiology and pathophysiology. We  talked about how the symptoms, course and prognosis differ from Parkinson's Disease.  We talked about the risks, particularly for falls and aspiration.   -continue carbidopa/levodopa 25/100, 2 po tid  -Talked to her about staying seated at all times last visit, but then she ended up in the emergency room on 2 separate occasions with fairly significant falls, 1 of which she hit her head.  She is on Coumadin.  Again reiterated that she is not to be walking.  Patient is a very significant fall risk.  Discussed with patient and her husband the morbidity and mortality associated with falls.  -Talk to them again about advanced directives.  They have the paperwork but haven't completed it.  Talked about that again today  -discussed WC types.  Ultimately, an ultralight manual WC is what I recommend.  Will send referral to advanced HC.  Will send to PT to evaluate for the [facility]  -had pt meet with my social worker today.   2.  Neck pain with cervical dystonia  -I think this is multifactorial.  She did have an MRI of the cervical spine on 06/03/2016 that demonstrated severe C2-C3 left facet arthropathy with joint effusion and bone marrow edema.  She has received injections for this.  She also has evidence of cervical dystonia, although this surprisingly has improved some with levodopa. Neck pain is better 3.  cough  -Last modified barium swallow was in June, 2019.  This was normal.    -likely related to reflux as she has a globus sensation.  On Pepcid and PPI. Told to f/u with prescribing physician. 4.  Follow up is anticipated in the next 4-6 months, sooner should new neurologic issues arise.  Much greater than 50% of this visit was spent in counseling and coordinating care.  Total face to face time:  25 min

## 2019-09-27 NOTE — Progress Notes (Signed)
Virtual Visit via Telephone Note   This visit type was conducted due to national recommendations for restrictions regarding the COVID-19 Pandemic (e.g. social distancing) in an effort to limit this patient's exposure and mitigate transmission in our community.  Due to her co-morbid illnesses, this patient is at least at moderate risk for complications without adequate follow up.  This format is felt to be most appropriate for this patient at this time.  The patient did not have access to video technology/had technical difficulties with video requiring transitioning to audio format only (telephone).  All issues noted in this document were discussed and addressed.  No physical exam could be performed with this format.  Please refer to the patient's chart for her  consent to telehealth for [facility].   Date:  09/28/2019   ID:  Christy, [name] 1941-11-25, MRN [id number]  Patient Location: Home Provider Location: Home  PCP:  Christy Battles, MD  Cardiologist:  Christy Mustard Martinique, MD  Electrophysiologist:  None   Evaluation Performed:  Follow-Up Visit  Chief Complaint:  Afib, AS  History of Present Illness:    [name] is a 78 y.o. female with a h/o severe AS s/p SJM mech AVR in 2007. She also has a h/o PAF and is chronically anticoagulated with coumadin.  She has a history of bradycardia on beta blocker and this was DC'd.   September 2017 following a urologic procedure  she developed abd pain, nausea, c/p, sob, and palpitations, prompting her to call EMS.  She was found to be in AF with RVR, which later converted to sinus rhythm.  She was admitted to [facility] and had mild troponin elevation.  She  underwent stress testing, which was low risk.    She was seen in October 2017 by [name] who felt her Afib episodes were infrequent and recommended following for now. If episodes became more frequent then consider resumption of beta blocker or AAD without rate slowing effects such  as Tikosyn.   She does have a history of atypical Parikinsons and progressive supranuclear palsy followed by Neurology.   Admitted 12/10-12/14/2019 for Afib RVR, chest pain, anemia (Hgb 5.6) following a fall/trauma, extensive hematoma left chest. She became volume overloaded post transfusion>>BiPAP>>diuresed, low-dose amio started for elevated HR, may need PPM in the future, no source of bleeding on EGD, follow CBC, on PPI.   Seen by [name] in March and appeared to be maintaining NSR on amiodarone. She still falls easily. She reports a fall several weeks ago with rib fractures. She was seen in the ED on 09/09/19 with mechanical fall and facial laceration. CT negative for bleed. INR therapeutic. She denies any significant palpitations. No syncope. No chest pain or dyspnea.   The patient does not have symptoms concerning for COVID-19 infection (fever, chills, cough, or new shortness of breath).    Past Medical History:  Diagnosis Date   Arthropathy of cervical facet joint    C2-3   At risk for falls    BPV (benign positional vertigo)    Cancer ([place]) 2004    breast-lumpectomy,nodes ,radiation,chemo   Chronic neck pain    CKD (chronic kidney disease), stage II    Diverticulosis of colon    Elevated troponin    a. 08/2016 in setting of PAF;  b. 08/2016 low risk MV w/ fixed septal defect (LBBB), EF 53%.   GERD (gastroesophageal reflux disease)    Hiatal hernia    History of aortic valve stenosis  History of breast cancer per pt no recurrence   dx 2004-- Left breast DCIS (ER/PR+, HER2 negative) s/p  partial masectomy w/ sln bx and  chemoradiotion (completed 2004)   History of colon polyps    History of herpes zoster    History of kidney stones    HTN (hypertension)    Hyperlipidemia    Hypertrophy of inter-atrial septum    a. 09/2014 Echo: ? of atrial mass; b. 10/2014 Cardiac MRI: no atrial septal mass - polypoid lipomatous hypertrophy of superior atrial septum  noted-->Benign.   Irritable bowel syndrome    LBBB (left bundle branch block)    PAF (paroxysmal atrial fibrillation) (HCC)    a. CHA2DS2VASc = 4-->coumadin.   [facility])    neurologist-  Christy tat   Personal history of chemotherapy 2004   Personal history of radiation therapy 2004   PONV (postoperative nausea and vomiting)    PVC's (premature ventricular contractions)    Right ureteral stone    S/P aortic valve replacement with prosthetic valve    a. 04/13/2006 s/p St Jude mechnical AVR for severe AS;  b. 09/2014 Echo: EF 40-45%, gr1 DD, mild AI/MR, triv TR/PR.   SUI (stress urinary incontinence, female)    Symptomatic anemia    Venous varices     Past Surgical History:  Procedure Laterality Date   ANTERIOR AND POSTERIOR REPAIR  1990   cystocele/ rectocele   AORTIC VALVE REPLACEMENT  04/13/06   Christy gerhardt   and Replacement Ascending Aorta (St. Jude mechanical prosthesis)   APPENDECTOMY  child 1950   BREAST LUMPECTOMY Left 01/11/2003   malignant   BREAST SURGERY Left 2004   CARDIAC CATHETERIZATION  02-22-2001   [name]   minimal non-obstructive cad/  mild aortic stenosis and aortic root enlargement/  normal LVF, ef 65%   CARDIAC CATHETERIZATION  03-22-2006    [name]   no significant obstructive cad/  severe aortic stenosis and mild to moderate aortic root enlargement/  upper normal right heart pressures   CARDIOVASCULAR STRESS TEST  09-30-2011   [name]   lexiscan nuclear study w/ apical thinning  vs  small prior apical infarct w/ no significant ischemia/  hypokinesis of the distal septum and apex, ef 50%   CARPAL TUNNEL RELEASE Bilateral left 09-09-2004/  right 2007   CATARACT EXTRACTION W/ INTRAOCULAR LENS  IMPLANT, BILATERAL  2015   COLONOSCOPY N/A 10/03/2014   Procedure: COLONOSCOPY;  Surgeon: [name], MD;  Location: [place];  Service: Endoscopy;  Laterality: N/A;   CYSTOSCOPY WITH RETROGRADE PYELOGRAM, URETEROSCOPY AND STENT  PLACEMENT Right 09/17/2016   Procedure: CYSTOSCOPY WITH RIGHT RETROGRADE PYELOGRAM, RIGHT URETEROSCOPY AND STENT PLACEMENT LASER LITHOTRIPSY, RIGHT STENT PLACEMENT;  Surgeon: [name], MD;  Location: [facility];  Service: Urology;  Laterality: Right;   ESOPHAGEAL MANOMETRY N/A 08/12/2014   Procedure: ESOPHAGEAL MANOMETRY (EM);  Surgeon: [name], MD;  Location: [facility];  Service: Endoscopy;  Laterality: N/A;   ESOPHAGOGASTRODUODENOSCOPY N/A 10/03/2014   Procedure: ESOPHAGOGASTRODUODENOSCOPY (EGD);  Surgeon: [name], MD;  Location: [facility]COPY;  Service: Endoscopy;  Laterality: N/A;   ESOPHAGOGASTRODUODENOSCOPY N/A 12/08/2018   Procedure: ESOPHAGOGASTRODUODENOSCOPY (EGD);  Surgeon: Doran Stabler, MD;  Location: [place];  Service: Gastroenterology;  Laterality: N/A;   EXTRACORPOREAL SHOCK WAVE LITHOTRIPSY  1997   FOOT SURGERY Right 2013   hammertoe x2   HOLMIUM LASER APPLICATION Right 5/97/4163   Procedure: HOLMIUM LASER APPLICATION;  Surgeon: Viona Gilmore  Louis Meckel, MD;  Location: [facility];  Service: Urology;  Laterality: Right;   PERCUTANEOUS NEPHROSTOLITHOTOMY  1993   STONE EXTRACTION WITH BASKET Right 09/17/2016   Procedure: STONE EXTRACTION WITH BASKET;  Surgeon: [name], MD;  Location: [facility];  Service: Urology;  Laterality: Right;   TOTAL ABDOMINAL HYSTERECTOMY  1971   w/ Bilateral Salpingoophorectomy   TRANSTHORACIC ECHOCARDIOGRAM  10/24/2014   [name]   hypokinesis of the basal and mid inferoseptal and inferior walls,  ef 40-45%,  grade 1 diastolic dysfunction/  mechanical bileaflet aortic valve noted w/ mild regur., peak grandient 73mHg, valve area 1.35cm^2/  severe MV calcification without stenosis w/ mild regurg., peak gradient 366mg/  mild LAE/  poss. atrial mass (MRI showed benign lipomatous hypertrophy of atrial septum) /tFrazier ButtR/mild TR     Current Meds  Medication Sig     acetaminophen (TYLENOL) 500 MG tablet Take 1,000 mg by mouth every 6 (six) hours as needed (head pain).    amiodarone (PACERONE) 200 MG tablet Take 1 tablet by mouth once daily   calcium carbonate (TUMS - DOSED IN MG ELEMENTAL CALCIUM) 500 MG chewable tablet Chew 1 tablet by mouth daily.   calcium citrate-vitamin D (CITRACAL+D) 315-200 MG-UNIT per tablet Take 1 tablet by mouth 3 (three) times daily.   carbidopa-levodopa (SINEMET IR) 25-100 MG tablet TAKE 2 TABLETS BY MOUTH THREE TIMES DAILY   Cholecalciferol (VITAMIN D3) 2000 units TABS Take 1 capsule by mouth daily.   famotidine-calcium carbonate-magnesium hydroxide (PEPCID COMPLETE) 10-800-165 MG CHEW chewable tablet Chew 1 tablet by mouth daily as needed (heartburn).   KLOR-CON M20 20 MEQ tablet Take 20 mEq by mouth daily.   KRILL OIL PO Take 1 capsule by mouth daily.    Melatonin 3 MG TABS Take 3 mg by mouth at bedtime as needed (sleep).    metoprolol tartrate (LOPRESSOR) 25 MG tablet Take 25 mg as needed if heart rate greater than 110   Multiple Vitamin (MULTIVITAMIN) tablet Take 1 tablet by mouth daily at 12 noon.    Multiple Vitamins-Minerals (VISION-VITE PRESERVE PO) Take 1 tablet by mouth 2 (two) times daily.   pantoprazole (PROTONIX) 40 MG tablet Take 40 mg by mouth daily.   Probiotic Product (PROBIOTIC-10 PO) Take 1 capsule by mouth daily.    simvastatin (ZOCOR) 20 MG tablet Take 20 mg by mouth every evening.   vitamin B-12 (CYANOCOBALAMIN) 1000 MCG tablet Take 1,000 mcg by mouth every morning.   warfarin (COUMADIN) 5 MG tablet Take 5 mg by mouth. Take 6m76m, T, W, TH and 2.5 M, F  by mouth every day     Allergies:   Demerol [meperidine], Oxycodone, Adhesive [tape], Ivp dye [iodinated diagnostic agents], and Phenergan [promethazine]   Social History   Tobacco Use   Smoking status: Never Smoker   Smokeless tobacco: Never Used  Substance Use Topics   Alcohol use: No    Alcohol/week: 0.0 standard drinks    Drug use: No     Family Hx: The patient's family history includes Heart disease in her mother; Heart disease (age of onset: 76)29n her father; Other in her brother.  ROS:   Please see the history of present illness.    All other systems reviewed and are negative.   Prior CV studies:   The following studies were reviewed today:  11/17/18: TTE Study Conclusions - Left ventricle: The cavity size was normal. Wall thickness was normal. Systolic function was mildly reduced. The  estimated ejection fraction was in the range of 45% to 50%. Features are consistent with a pseudonormal left ventricular filling pattern, with concomitant abnormal relaxation and increased filling pressure (grade 2 diastolic dysfunction). - Aortic valve: A mechanical prosthesis was present. There was mild regurgitation. Valve area (VTI): 1.43 cm^2. Valve area (Vmax): 1.28 cm^2. Valve area (Vmean): 1.2 cm^2. - Mitral valve: Mildly to moderately calcified annulus. Valve area by continuity equation (using LVOT flow): 1.55 cm^2. - Left atrium: The atrium was mildly dilated.   09/21/18 stress myoview IMPRESSION: 1. No evidence reversible ischemia. Fixed defect in the septal wall with differential including infarction versus artifact from LEFT bundle branch block. 2. Septal dyskinesia in patient with LEFT bundle branch block. 3. Left ventricular ejection fraction 53% 4. Non invasive risk stratification*: Low    Labs/Other Tests and Data Reviewed:    EKG:  No ECG reviewed.  Recent Labs: 12/05/2018: Magnesium 1.7; TSH 1.304 09/09/2019: BUN 23; Creatinine, Ser 1.46; Hemoglobin 12.7; Platelets 194; Potassium 4.7; Sodium 140   Recent Lipid Panel Lab Results  Component Value Date/Time   CHOL 145 09/30/2011 04:15 AM   TRIG 42 09/30/2011 04:15 AM   HDL 77 09/30/2011 04:15 AM   CHOLHDL 1.9 09/30/2011 04:15 AM   LDLCALC 60 09/30/2011 04:15 AM   Dated 06/12/19: cholesterol 147,  triglycerides 74, HDL 64, LDL 68. ALT normal  Wt Readings from Last 3 Encounters:  09/28/19 125 lb 6 oz (56.9 kg)  06/26/19 131 lb (59.4 kg)  04/17/19 132 lb (59.9 kg)     Objective:    Vital Signs:  BP 122/67    Pulse (!) 53    Ht 4' 11"  (1.499 m)    Wt 125 lb 6 oz (56.9 kg)    BMI 25.32 kg/m    VITAL SIGNS:  reviewed  ASSESSMENT & PLAN:    1.  PAF/Tachy-brady syndrome:  Pt is now on amiodarone and appears to be maintaining NSR. Ecg reviewed from ED on 9/13 showing sinus brady.  Previously she had history of severe brady on beta blockers.To date no significant bradycardia on amiodarone. I will follow up in 6 months.  2.  S/P SJM AVR:  Stable on echo in September 2019.  continue Chronic coumadin. SBE prophylaxis.  3.  Essential HTN:  Controlled  4.  HLD:  well controlled. Cont statin. Good control based on labs in June.   COVID-19 Education: The signs and symptoms of COVID-19 were discussed with the patient and how to seek care for testing (follow up with PCP or arrange E-visit).  The importance of social distancing was discussed today.  Time:   Today, I have spent 10 minutes with the patient with telehealth technology discussing the above problems.     Medication Adjustments/Labs and Tests Ordered: Current medicines are reviewed at length with the patient today.  Concerns regarding medicines are outlined above.   Tests Ordered: No orders of the defined types were placed in this encounter.   Medication Changes: No orders of the defined types were placed in this encounter.   Follow Up:  In Person in 6 month(s)  Signed, Anetria Harwick Martinique, MD  09/28/2019 2:15 PM    [place]

## 2019-09-28 ENCOUNTER — Encounter: Payer: Self-pay | Admitting: Cardiology

## 2019-09-28 ENCOUNTER — Telehealth (INDEPENDENT_AMBULATORY_CARE_PROVIDER_SITE_OTHER): Payer: PPO | Admitting: Cardiology

## 2019-09-28 VITALS — BP 122/67 | HR 53 | Ht 59.0 in | Wt 125.4 lb

## 2019-09-28 DIAGNOSIS — I480 Paroxysmal atrial fibrillation: Secondary | ICD-10-CM

## 2019-09-28 DIAGNOSIS — I10 Essential (primary) hypertension: Secondary | ICD-10-CM

## 2019-09-28 DIAGNOSIS — Z952 Presence of prosthetic heart valve: Secondary | ICD-10-CM

## 2019-09-28 DIAGNOSIS — I495 Sick sinus syndrome: Secondary | ICD-10-CM | POA: Diagnosis not present

## 2019-09-28 NOTE — Patient Instructions (Signed)
Medication Instructions:  Continue same medications If you need a refill on your cardiac medications before your next appointment, please call your pharmacy.   Lab work: None ordered  Testing/Procedures: None ordered  Follow-Up: At [facility], you and your health needs are our priority.  As part of our continuing mission to provide you with exceptional heart care, we have created designated Provider Care Teams.  These Care Teams include your primary Cardiologist (physician) and Advanced Practice Providers (APPs -  Physician Assistants and Nurse Practitioners) who all work together to provide you with the care you need, when you need it. . Schedule follow up appointment in 6 months  Call in Jan to schedule April appointment

## 2019-10-01 ENCOUNTER — Other Ambulatory Visit: Payer: Self-pay

## 2019-10-01 ENCOUNTER — Encounter: Payer: Self-pay | Admitting: Neurology

## 2019-10-01 ENCOUNTER — Ambulatory Visit: Payer: PPO | Admitting: Neurology

## 2019-10-01 ENCOUNTER — Ambulatory Visit (INDEPENDENT_AMBULATORY_CARE_PROVIDER_SITE_OTHER): Payer: PPO | Admitting: Clinical

## 2019-10-01 VITALS — BP 133/73 | HR 67 | Ht 59.0 in | Wt 129.2 lb

## 2019-10-01 DIAGNOSIS — G231 Progressive supranuclear ophthalmoplegia [Steele-Richardson-Olszewski]: Secondary | ICD-10-CM | POA: Diagnosis not present

## 2019-10-01 DIAGNOSIS — Z7189 Other specified counseling: Secondary | ICD-10-CM

## 2019-10-01 DIAGNOSIS — R1319 Other dysphagia: Secondary | ICD-10-CM | POA: Diagnosis not present

## 2019-10-01 DIAGNOSIS — Z719 Counseling, unspecified: Secondary | ICD-10-CM | POA: Diagnosis not present

## 2019-10-02 DIAGNOSIS — I4891 Unspecified atrial fibrillation: Secondary | ICD-10-CM | POA: Diagnosis not present

## 2019-10-02 DIAGNOSIS — I358 Other nonrheumatic aortic valve disorders: Secondary | ICD-10-CM | POA: Diagnosis not present

## 2019-10-02 DIAGNOSIS — M810 Age-related osteoporosis without current pathological fracture: Secondary | ICD-10-CM | POA: Diagnosis not present

## 2019-10-02 NOTE — BH Specialist Note (Signed)
Pt presents with dx of Progressive Supranuclear Palsy. Husband [name] attends appt with pt and reports role of primary caregiver to pt. [facility] provided supportive counseling as pt & caregiver shared current sentiments re diagnosis and questions re accommodations and future health considerations as the disease progresses. Pt responded receptively to supportive counseling and psychoeducation today, pt expressed gratitude to support.  Pt would likely benefit from engagement in PSP support groups and outpatient therapy as needed. Pt caregiver would also likely benefit from engagement in support groups, information provided to him re [facility] men's group and [facility] group.  LCSW will remain available for future consultation.

## 2019-10-02 NOTE — Addendum Note (Signed)
Addended by: Ranae Plumber on: 10/02/2019 07:55 AM   Modules accepted: Orders

## 2019-10-11 ENCOUNTER — Telehealth: Payer: Self-pay | Admitting: Neurology

## 2019-10-11 NOTE — Telephone Encounter (Signed)
Veronica from [facility] is calling in about an order that was supposed to be on this patient for a wheelchair. She has some questions regarding that. Thanks!

## 2019-10-11 NOTE — Telephone Encounter (Signed)
Called veronica back she was given contact information to Andria Rhein this was handles and taken care of by her. Per our community messages.  She will contact her to continue process with wheel chair.

## 2019-10-24 DIAGNOSIS — Z7409 Other reduced mobility: Secondary | ICD-10-CM | POA: Diagnosis not present

## 2019-10-24 DIAGNOSIS — R2689 Other abnormalities of gait and mobility: Secondary | ICD-10-CM | POA: Diagnosis not present

## 2019-10-24 DIAGNOSIS — G231 Progressive supranuclear ophthalmoplegia [Steele-Richardson-Olszewski]: Secondary | ICD-10-CM | POA: Diagnosis not present

## 2019-10-25 ENCOUNTER — Telehealth: Payer: Self-pay

## 2019-10-25 NOTE — Telephone Encounter (Signed)
Received Medicare Physical Therapy Plan Certification from [employer] [place] for patient that requested plan be signed by provider and faxed back once provider has approved plan and signed. Form was signed by provider and faxed back to [phone] @ 9:08 am.

## 2019-10-26 ENCOUNTER — Telehealth: Payer: Self-pay | Admitting: Neurology

## 2019-10-26 NOTE — Telephone Encounter (Signed)
Patient's husband is wanting to speak with you in regards to you helping her during last visit. He said to call him back at [phone]. Thanks!

## 2019-10-29 DIAGNOSIS — N3000 Acute cystitis without hematuria: Secondary | ICD-10-CM | POA: Diagnosis not present

## 2019-10-29 DIAGNOSIS — N200 Calculus of kidney: Secondary | ICD-10-CM | POA: Diagnosis not present

## 2019-11-05 DIAGNOSIS — N200 Calculus of kidney: Secondary | ICD-10-CM | POA: Diagnosis not present

## 2019-11-05 DIAGNOSIS — R1031 Right lower quadrant pain: Secondary | ICD-10-CM | POA: Diagnosis not present

## 2019-11-06 DIAGNOSIS — I358 Other nonrheumatic aortic valve disorders: Secondary | ICD-10-CM | POA: Diagnosis not present

## 2019-11-06 DIAGNOSIS — I4891 Unspecified atrial fibrillation: Secondary | ICD-10-CM | POA: Diagnosis not present

## 2019-11-06 DIAGNOSIS — Z7901 Long term (current) use of anticoagulants: Secondary | ICD-10-CM | POA: Diagnosis not present

## 2019-11-13 DIAGNOSIS — R3121 Asymptomatic microscopic hematuria: Secondary | ICD-10-CM | POA: Diagnosis not present

## 2019-11-13 DIAGNOSIS — R3129 Other microscopic hematuria: Secondary | ICD-10-CM | POA: Diagnosis not present

## 2019-11-13 DIAGNOSIS — N200 Calculus of kidney: Secondary | ICD-10-CM | POA: Diagnosis not present

## 2019-11-15 DIAGNOSIS — R1031 Right lower quadrant pain: Secondary | ICD-10-CM | POA: Diagnosis not present

## 2019-11-15 DIAGNOSIS — N200 Calculus of kidney: Secondary | ICD-10-CM | POA: Diagnosis not present

## 2019-11-15 DIAGNOSIS — R3121 Asymptomatic microscopic hematuria: Secondary | ICD-10-CM | POA: Diagnosis not present

## 2019-11-20 DIAGNOSIS — G231 Progressive supranuclear ophthalmoplegia [Steele-Richardson-Olszewski]: Secondary | ICD-10-CM | POA: Diagnosis not present

## 2019-11-20 DIAGNOSIS — R269 Unspecified abnormalities of gait and mobility: Secondary | ICD-10-CM | POA: Diagnosis not present

## 2019-11-20 DIAGNOSIS — Z7901 Long term (current) use of anticoagulants: Secondary | ICD-10-CM | POA: Diagnosis not present

## 2019-11-20 DIAGNOSIS — I480 Paroxysmal atrial fibrillation: Secondary | ICD-10-CM | POA: Diagnosis not present

## 2019-11-20 DIAGNOSIS — M8580 Other specified disorders of bone density and structure, unspecified site: Secondary | ICD-10-CM | POA: Diagnosis not present

## 2019-11-20 DIAGNOSIS — Z952 Presence of prosthetic heart valve: Secondary | ICD-10-CM | POA: Diagnosis not present

## 2019-12-06 DIAGNOSIS — I358 Other nonrheumatic aortic valve disorders: Secondary | ICD-10-CM | POA: Diagnosis not present

## 2019-12-06 DIAGNOSIS — Z7901 Long term (current) use of anticoagulants: Secondary | ICD-10-CM | POA: Diagnosis not present

## 2019-12-10 ENCOUNTER — Other Ambulatory Visit: Payer: Self-pay | Admitting: Neurology

## 2019-12-18 DIAGNOSIS — I358 Other nonrheumatic aortic valve disorders: Secondary | ICD-10-CM | POA: Diagnosis not present

## 2019-12-18 DIAGNOSIS — I480 Paroxysmal atrial fibrillation: Secondary | ICD-10-CM | POA: Diagnosis not present

## 2019-12-18 DIAGNOSIS — Z7901 Long term (current) use of anticoagulants: Secondary | ICD-10-CM | POA: Diagnosis not present

## 2019-12-24 ENCOUNTER — Other Ambulatory Visit: Payer: Self-pay | Admitting: Internal Medicine

## 2019-12-24 DIAGNOSIS — Z1231 Encounter for screening mammogram for malignant neoplasm of breast: Secondary | ICD-10-CM

## 2020-01-07 ENCOUNTER — Ambulatory Visit: Payer: Medicare Other | Attending: Internal Medicine

## 2020-01-07 DIAGNOSIS — Z23 Encounter for immunization: Secondary | ICD-10-CM

## 2020-01-07 NOTE — Progress Notes (Signed)
   Covid-19 Vaccination Clinic  Name:  [name]    MRN: [id number] DOB: 1941-03-21  01/07/2020  [name] was observed post Covid-19 immunization for 30 minutes based on pre-vaccination screening without incidence. She was provided with Vaccine Information Sheet and instruction to access the V-Safe system.   [name] was instructed to call 911 with any severe reactions post vaccine: Marland Kitchen Difficulty breathing  . Swelling of your face and throat  . A fast heartbeat  . A bad rash all over your body  . Dizziness and weakness    Immunizations Administered    Name Date Dose VIS Date Route   Pfizer COVID-19 Vaccine 01/07/2020  9:24 AM 0.3 mL 12/07/2019 Intramuscular   Manufacturer: [place]   Lot: [id number]   [place] [employer]: [id number]

## 2020-01-17 DIAGNOSIS — I358 Other nonrheumatic aortic valve disorders: Secondary | ICD-10-CM | POA: Diagnosis not present

## 2020-01-17 DIAGNOSIS — I4891 Unspecified atrial fibrillation: Secondary | ICD-10-CM | POA: Diagnosis not present

## 2020-01-17 DIAGNOSIS — Z7901 Long term (current) use of anticoagulants: Secondary | ICD-10-CM | POA: Diagnosis not present

## 2020-01-17 DIAGNOSIS — I480 Paroxysmal atrial fibrillation: Secondary | ICD-10-CM | POA: Diagnosis not present

## 2020-01-25 ENCOUNTER — Ambulatory Visit: Payer: Medicare Other

## 2020-01-26 ENCOUNTER — Ambulatory Visit: Payer: PPO | Attending: Internal Medicine

## 2020-01-26 DIAGNOSIS — Z23 Encounter for immunization: Secondary | ICD-10-CM | POA: Insufficient documentation

## 2020-01-26 NOTE — Progress Notes (Signed)
   Covid-19 Vaccination Clinic  Name:  [name]    MRN: [id number] DOB: 1941-04-16  01/26/2020  [name] was observed post Covid-19 immunization for 15 minutes without incidence. She was provided with Vaccine Information Sheet and instruction to access the V-Safe system.   [name] was instructed to call 911 with any severe reactions post vaccine: Marland Kitchen Difficulty breathing  . Swelling of your face and throat  . A fast heartbeat  . A bad rash all over your body  . Dizziness and weakness    Immunizations Administered    Name Date Dose VIS Date Route   Pfizer COVID-19 Vaccine 01/26/2020  8:30 AM 0.3 mL 12/07/2019 Intramuscular   Manufacturer: [place]   Lot: [id number]   [place]: [id number]

## 2020-02-13 DIAGNOSIS — Z9010 Acquired absence of unspecified breast and nipple: Secondary | ICD-10-CM | POA: Insufficient documentation

## 2020-02-13 DIAGNOSIS — Z7901 Long term (current) use of anticoagulants: Secondary | ICD-10-CM | POA: Diagnosis not present

## 2020-02-13 DIAGNOSIS — M8580 Other specified disorders of bone density and structure, unspecified site: Secondary | ICD-10-CM | POA: Insufficient documentation

## 2020-02-13 DIAGNOSIS — N390 Urinary tract infection, site not specified: Secondary | ICD-10-CM | POA: Insufficient documentation

## 2020-02-13 DIAGNOSIS — Z01419 Encounter for gynecological examination (general) (routine) without abnormal findings: Secondary | ICD-10-CM | POA: Diagnosis not present

## 2020-02-13 DIAGNOSIS — Z6825 Body mass index (BMI) 25.0-25.9, adult: Secondary | ICD-10-CM | POA: Diagnosis not present

## 2020-02-13 DIAGNOSIS — I10 Essential (primary) hypertension: Secondary | ICD-10-CM | POA: Insufficient documentation

## 2020-02-13 DIAGNOSIS — I519 Heart disease, unspecified: Secondary | ICD-10-CM | POA: Insufficient documentation

## 2020-02-18 DIAGNOSIS — C50912 Malignant neoplasm of unspecified site of left female breast: Secondary | ICD-10-CM | POA: Diagnosis not present

## 2020-02-28 NOTE — Progress Notes (Signed)
[name] was seen today in follow up for PSP.  Husband accompanies her and supplements the history.  My previous records were reviewed prior to todays visit as well as outside records available to me. Pts husband states that she has had a few minor falls but she has had not had major injury and they consider that a positive.  No choking. Pt denies lightheadedness, near syncope.  No hallucinations.  Mood has been good.  Has not completed advanced directives yet.  Current prescribed movement disorder medications: Carbidopa/levodopa 25/100, 2 tablets 3 times per day   ALLERGIES:   Allergies  Allergen Reactions  . Demerol [Meperidine] Shortness Of Breath and Swelling  . Oxycodone Other (See Comments)    Hallucinations   . Adhesive [Tape] Other (See Comments)    Redness and skin tears  . Ivp Dye [Iodinated Diagnostic Agents] Hives    OK with benadryl pre-med (50mg  one hour before receiving iodinated contrast agent)  . Phenergan [Promethazine] Other (See Comments)    Avoids due to reaction with current medications    CURRENT MEDICATIONS:  Outpatient Encounter Medications as of 02/29/2020  Medication Sig  . acetaminophen (TYLENOL) 500 MG tablet Take 1,000 mg by mouth every 6 (six) hours as needed (head pain).   Marland Kitchen amiodarone (PACERONE) 200 MG tablet Take 1 tablet by mouth once daily  . calcium carbonate (TUMS - DOSED IN MG ELEMENTAL CALCIUM) 500 MG chewable tablet Chew 1 tablet by mouth daily.  . calcium citrate-vitamin D (CITRACAL+D) 315-200 MG-UNIT per tablet Take 1 tablet by mouth 3 (three) times daily.  . carbidopa-levodopa (SINEMET IR) 25-100 MG tablet TAKE 2 TABLETS BY MOUTH THREE TIMES DAILY  . Cholecalciferol (VITAMIN D3) 2000 units TABS Take 1 capsule by mouth daily.  . famotidine-calcium carbonate-magnesium hydroxide (PEPCID COMPLETE) 10-800-165 MG CHEW chewable tablet Chew 1 tablet by mouth daily as needed (heartburn).  Marland Kitchen KLOR-CON M20 20 MEQ tablet Take 20 mEq by  mouth daily.  Marland Kitchen KRILL OIL PO Take 1 capsule by mouth daily.   . Melatonin 3 MG TABS Take 3 mg by mouth at bedtime as needed (sleep).   . metoprolol tartrate (LOPRESSOR) 25 MG tablet Take 1 tablet (25 mg total) by mouth daily as needed (if heart rate is above 110).  . metoprolol tartrate (LOPRESSOR) 25 MG tablet Take 25 mg as needed if heart rate greater than 110  . Multiple Vitamin (MULTIVITAMIN) tablet Take 1 tablet by mouth daily at 12 noon.   . Multiple Vitamins-Minerals (VISION-VITE PRESERVE PO) Take 1 tablet by mouth 2 (two) times daily.  . pantoprazole (PROTONIX) 40 MG tablet Take 40 mg by mouth daily.  . Probiotic Product (PROBIOTIC-10 PO) Take 1 capsule by mouth daily.   . simvastatin (ZOCOR) 20 MG tablet Take 20 mg by mouth every evening.  . vitamin B-12 (CYANOCOBALAMIN) 1000 MCG tablet Take 1,000 mcg by mouth every morning.  . warfarin (COUMADIN) 5 MG tablet Take 5 mg by mouth. Take 5mg  S, T, W, TH and 2.5 M, F  by mouth every day   No facility-administered encounter medications on file as of 02/29/2020.    PHYSICAL EXAMINATION:    VITALS:   Vitals:   02/29/20 0832  BP: (!) 164/78  Pulse: 70  SpO2: 95%  Weight: 127 lb (57.6 kg)  Height: 4' 11.75" (1.518 m)    GEN:  The patient appears stated age and is in NAD. HEENT:  Normocephalic, atraumatic.  She is wearing a helmet.  The mucous membranes are moist. The superficial temporal arteries are without ropiness or tenderness. CV:  RRR Lungs:  CTAB Neck/HEME:  There are no carotid bruits bilaterally.  Neurological examination:  Orientation: The patient is alert and oriented x3. Cranial nerves: There is good facial symmetry with significant facial hypomimia. There is square wave jerks.  There is mild upgaze and downgaze paresis but not complete.  The speech is fluent and clear. Soft palate rises symmetrically and there is no tongue deviation. Hearing is intact to conversational tone. Sensation: Sensation is intact to light  touch throughout Motor: Strength is at least antigravity x4.  Movement examination: Tone: There is normal tone in the UE/LE Abnormal movements: rare rue tremor Coordination:  There is mild decremation with RAM's, in the toe taps bilaterally Gait and Station: The patient has difficulty arising out of a deep-seated chair without the use of the hands. The patient's stride length is markedly decreased.      ASSESSMENT/PLAN:  1.  PSP  -Continue carbidopa/levodopa 25/100, 2 tablets 3 times per day  -Still working on getting her a wheelchair, but they have gotten one for intermittent use until she gets hers.  She needs to stay seated at all times.  -have gotten the covid vaccine  2.  Neck pain with cervical dystonia  -Has actually improved with levodopa therapy.  Last MRI was in 2017 with severe C2-C3 left facet arthropathy with joint effusion and bone marrow edema.  She did receive injections for that, and pain has improved with time. 3.  Cough, likely related to reflux as she does have a globus sensation  -Following with primary care and on Pepcid and PPI.  Last modified barium was in June, 2019 and was unremarkable  Total time spent on today's visit was 30 minutes, including both face-to-face time and nonface-to-face time.  Time included that spent on review of records (prior notes available to me/labs/imaging if pertinent), discussing treatment and goals, answering patient's questions and coordinating care.  Cc:  Leanna Battles, MD

## 2020-02-29 ENCOUNTER — Other Ambulatory Visit: Payer: Self-pay

## 2020-02-29 ENCOUNTER — Ambulatory Visit (INDEPENDENT_AMBULATORY_CARE_PROVIDER_SITE_OTHER): Payer: PPO | Admitting: Neurology

## 2020-02-29 ENCOUNTER — Encounter: Payer: Self-pay | Admitting: Neurology

## 2020-02-29 VITALS — BP 164/78 | HR 70 | Ht 59.75 in | Wt 127.0 lb

## 2020-02-29 DIAGNOSIS — G231 Progressive supranuclear ophthalmoplegia [Steele-Richardson-Olszewski]: Secondary | ICD-10-CM | POA: Diagnosis not present

## 2020-02-29 NOTE — Patient Instructions (Signed)
The physicians and staff at [facility] are committed to providing excellent care. You may receive a survey requesting feedback about your experience at our office. We strive to receive "very good" responses to the survey questions. If you feel that your experience would prevent you from giving the office a "very good " response, please contact our office to try to remedy the situation. We may be reached at [phone]. Thank you for taking the time out of your busy day to complete the survey.  

## 2020-03-09 ENCOUNTER — Other Ambulatory Visit: Payer: Self-pay | Admitting: Internal Medicine

## 2020-03-09 ENCOUNTER — Other Ambulatory Visit: Payer: Self-pay | Admitting: Neurology

## 2020-03-11 DIAGNOSIS — Z7901 Long term (current) use of anticoagulants: Secondary | ICD-10-CM | POA: Diagnosis not present

## 2020-03-11 DIAGNOSIS — I480 Paroxysmal atrial fibrillation: Secondary | ICD-10-CM | POA: Diagnosis not present

## 2020-03-11 DIAGNOSIS — I358 Other nonrheumatic aortic valve disorders: Secondary | ICD-10-CM | POA: Diagnosis not present

## 2020-03-11 MED ORDER — AMIODARONE HCL 200 MG PO TABS: 200.0000 mg | ORAL_TABLET | Freq: Every day | ORAL | 0 refills | Status: DC

## 2020-03-12 NOTE — Progress Notes (Signed)
Electrophysiology Office Note Date: 03/14/2020  ID:  Christy, [name] Feb 27, 1941, MRN [id number]  PCP: Christy Battles, MD Primary Cardiologist: [name]: [name]  CC: AF follow up  [name] is a 79 y.o. female seen today for Dr [name].  She presents today for routine electrophysiology followup.  Since last being seen in our clinic, the patient reports doing very well.  She has had both doses of COVID vaccine.  She has had just a couple of episodes of AF since being seen, both lasted only minutes.  She denies chest pain, palpitations, dyspnea, PND, orthopnea, nausea, vomiting, dizziness, syncope, edema, weight gain, or early satiety.  Past Medical History:  Diagnosis Date  . Arthropathy of cervical facet joint    C2-3  . At risk for falls   . BPV (benign positional vertigo)   . Cancer ([place]) 2004    breast-lumpectomy,nodes ,radiation,chemo  . Chronic neck pain   . CKD (chronic kidney disease), stage II   . Diverticulosis of colon   . Elevated troponin    a. 08/2016 in setting of PAF;  b. 08/2016 low risk MV w/ fixed septal defect (LBBB), EF 53%.  Marland Kitchen GERD (gastroesophageal reflux disease)   . Hiatal hernia   . History of aortic valve stenosis   . History of breast cancer per pt no recurrence   dx 2004-- Left breast DCIS (ER/PR+, HER2 negative) s/p  partial masectomy w/ sln bx and  chemoradiotion (completed 2004)  . History of colon polyps   . History of herpes zoster   . History of kidney stones   . HTN (hypertension)   . Hyperlipidemia   . Hypertrophy of inter-atrial septum    a. 09/2014 Echo: ? of atrial mass; b. 10/2014 Cardiac MRI: no atrial septal mass - polypoid lipomatous hypertrophy of superior atrial septum noted-->Benign.  . Irritable bowel syndrome   . LBBB (left bundle branch block)   . PAF (paroxysmal atrial fibrillation) (HCC)    a. CHA2DS2VASc = 4-->coumadin.  . Parkinsonism [facility])    neurologist-  dr tat  . Personal  history of chemotherapy 2004  . Personal history of radiation therapy 2004  . PONV (postoperative nausea and vomiting)   . PVC's (premature ventricular contractions)   . Right ureteral stone   . S/P aortic valve replacement with prosthetic valve    a. 04/13/2006 s/p St Jude mechnical AVR for severe AS;  b. 09/2014 Echo: EF 40-45%, gr1 DD, mild AI/MR, triv TR/PR.  . SUI (stress urinary incontinence, female)   . Symptomatic anemia   . Venous varices     Past Surgical History:  Procedure Laterality Date  . ANTERIOR AND POSTERIOR REPAIR  1990   cystocele/ rectocele  . AORTIC VALVE REPLACEMENT  04/13/06   dr gerhardt   and Replacement Ascending Aorta (St. Jude mechanical prosthesis)  . APPENDECTOMY  child 1950  . BREAST LUMPECTOMY Left 01/11/2003   malignant  . BREAST SURGERY Left 2004  . CARDIAC CATHETERIZATION  02-22-2001   dr Christy   minimal non-obstructive cad/  mild aortic stenosis and aortic root enlargement/  normal LVF, ef 65%  . CARDIAC CATHETERIZATION  03-22-2006    dr Christy   no significant obstructive cad/  severe aortic stenosis and mild to moderate aortic root enlargement/  upper normal right heart pressures  . CARDIOVASCULAR STRESS TEST  09-30-2011   dr Christy   lexiscan nuclear study w/ apical thinning  vs  small prior apical infarct w/  no significant ischemia/  hypokinesis of the distal septum and apex, ef 50%  . CARPAL TUNNEL RELEASE Bilateral left 09-09-2004/  right 2007  . CATARACT EXTRACTION W/ INTRAOCULAR LENS  IMPLANT, BILATERAL  2015  . COLONOSCOPY N/A 10/03/2014   Procedure: COLONOSCOPY;  Surgeon: [name], MD;  Location: [place];  Service: Endoscopy;  Laterality: N/A;  . CYSTOSCOPY WITH RETROGRADE PYELOGRAM, URETEROSCOPY AND STENT PLACEMENT Right 09/17/2016   Procedure: CYSTOSCOPY WITH RIGHT RETROGRADE PYELOGRAM, RIGHT URETEROSCOPY AND STENT PLACEMENT LASER LITHOTRIPSY, RIGHT STENT PLACEMENT;  Surgeon: [name], MD;  Location: [facility];  Service: Urology;  Laterality: Right;  . ESOPHAGEAL MANOMETRY N/A 08/12/2014   Procedure: ESOPHAGEAL MANOMETRY (EM);  Surgeon: [name], MD;  Location: [facility];  Service: Endoscopy;  Laterality: N/A;  . ESOPHAGOGASTRODUODENOSCOPY N/A 10/03/2014   Procedure: ESOPHAGOGASTRODUODENOSCOPY (EGD);  Surgeon: [name], MD;  Location: [facility] ENDOSCOPY;  Service: Endoscopy;  Laterality: N/A;  . ESOPHAGOGASTRODUODENOSCOPY N/A 12/08/2018   Procedure: ESOPHAGOGASTRODUODENOSCOPY (EGD);  Surgeon: Doran Stabler, MD;  Location: [place];  Service: Gastroenterology;  Laterality: N/A;  . [place]  . FOOT SURGERY Right 2013   hammertoe x2  . HOLMIUM LASER APPLICATION Right 09/09/7828   Procedure: HOLMIUM LASER APPLICATION;  Surgeon: [name], MD;  Location: [facility];  Service: Urology;  Laterality: Right;  . PERCUTANEOUS NEPHROSTOLITHOTOMY  1993  . STONE EXTRACTION WITH BASKET Right 09/17/2016   Procedure: STONE EXTRACTION WITH BASKET;  Surgeon: [name], MD;  Location: [facility];  Service: Urology;  Laterality: Right;  . TOTAL ABDOMINAL HYSTERECTOMY  1971   w/ Bilateral Salpingoophorectomy  . TRANSTHORACIC ECHOCARDIOGRAM  10/24/2014   dr Christy   hypokinesis of the basal and mid inferoseptal and inferior walls,  ef 40-45%,  grade 1 diastolic dysfunction/  mechanical bileaflet aortic valve noted w/ mild regur., peak grandient 51mHg, valve area 1.35cm^2/  severe MV calcification without stenosis w/ mild regurg., peak gradient 311mg/  mild LAE/  poss. atrial mass (MRI showed benign lipomatous hypertrophy of atrial septum) /tFrazier ButtR/mild TR    Current Outpatient Medications  Medication Sig Dispense Refill  . acetaminophen (TYLENOL) 500 MG tablet Take 1,000 mg by mouth every 6 (six) hours as needed (head pain).     . Marland Kitchenmiodarone (PACERONE) 200 MG tablet Take 1 tablet (200 mg total) by mouth  daily. Please make overdue appt with Dr. TaLovena Leefore anymore refills. 1st attempt 30 tablet 0  . calcium carbonate (TUMS - DOSED IN MG ELEMENTAL CALCIUM) 500 MG chewable tablet Chew 1 tablet by mouth daily.    . calcium citrate-vitamin D (CITRACAL+D) 315-200 MG-UNIT per tablet Take 1 tablet by mouth 3 (three) times daily.    . carbidopa-levodopa (SINEMET IR) 25-100 MG tablet TAKE 2 TABLETS BY MOUTH THREE TIMES DAILY 540 tablet 2  . Cholecalciferol (VITAMIN D3) 2000 units TABS Take 1 capsule by mouth daily.    . famotidine-calcium carbonate-magnesium hydroxide (PEPCID COMPLETE) 10-800-165 MG CHEW chewable tablet Chew 1 tablet by mouth daily as needed (heartburn).    . Marland KitchenLOR-CON M20 20 MEQ tablet Take 20 mEq by mouth daily.    . Marland KitchenRILL OIL PO Take 1 capsule by mouth daily.     . Melatonin 3 MG TABS Take 3 mg by mouth at bedtime as needed (sleep).     . Multiple Vitamin (MULTIVITAMIN) tablet Take 1 tablet by mouth daily at 12 noon.     .Marland Kitchen  Multiple Vitamins-Minerals (VISION-VITE PRESERVE PO) Take 1 tablet by mouth 2 (two) times daily.    . pantoprazole (PROTONIX) 40 MG tablet Take 40 mg by mouth daily.    . Probiotic Product (PROBIOTIC-10 PO) Take 1 capsule by mouth daily.     . simvastatin (ZOCOR) 20 MG tablet Take 20 mg by mouth every evening.    . vitamin B-12 (CYANOCOBALAMIN) 1000 MCG tablet Take 1,000 mcg by mouth every morning.    . warfarin (COUMADIN) 5 MG tablet Take 5 mg by mouth daily. Patient is taking 16m on Monday and Friday. Patient is taking 2.5 mg on Tues.,Wed., Thurs., Sat., and Sun    . metoprolol tartrate (LOPRESSOR) 25 MG tablet Take 1 tablet (25 mg total) by mouth daily as needed (if heart rate is above 110). 90 tablet 3   No current facility-administered medications for this visit.    Allergies:   Demerol [meperidine], Oxycodone, Adhesive [tape], Ivp dye [iodinated diagnostic agents], and Phenergan [promethazine]   Social History: Social History   Socioeconomic History  .  Marital status: Married    Spouse name: Not on file  . Number of children: 3  . Years of education: 133 . Highest education level: 11th grade  Occupational History  . Occupation: bPress photographer OTHER    Comment: retired  Tobacco Use  . Smoking status: Never Smoker  . Smokeless tobacco: Never Used  Substance and Sexual Activity  . Alcohol use: No    Alcohol/week: 0.0 standard drinks  . Drug use: No  . Sexual activity: Not on file  Other Topics Concern  . Not on file  Social History Narrative      1/2 -1 soda a day     Right handed   Two story home   lives with spouse   Social Determinants of Health   Financial Resource Strain:   . Difficulty of Paying Living Expenses:   Food Insecurity:   . Worried About RCharity fundraiserin the Last Year:   . RArboriculturistin the Last Year:   Transportation Needs:   . LFilm/video editor(Medical):   .Marland KitchenLack of Transportation (Non-Medical):   Physical Activity:   . Days of Exercise per Week:   . Minutes of Exercise per Session:   Stress:   . Feeling of Stress :   Social Connections:   . Frequency of Communication with Friends and Family:   . Frequency of Social Gatherings with Friends and Family:   . Attends Religious Services:   . Active Member of Clubs or Organizations:   . Attends CArchivistMeetings:   .Marland KitchenMarital Status:   Intimate Partner Violence:   . Fear of Current or Ex-Partner:   . Emotionally Abused:   .Marland KitchenPhysically Abused:   . Sexually Abused:     Family History: Family History  Problem Relation Age of Onset  . Heart disease Mother   . Heart disease Father 776      CABG  . Other Brother        AVR  . Healthy Child     Review of Systems: All other systems reviewed and are otherwise negative except as noted above.   Physical Exam: VS:  BP 134/60   Pulse (!) 49   Ht 4' 11.75" (1.518 m)   Wt 124 lb 3.2 oz (56.3 kg)   SpO2 98%   BMI 24.46 kg/m  , BMI Body mass index  is  24.46 kg/m. Wt Readings from Last 3 Encounters:  03/14/20 124 lb 3.2 oz (56.3 kg)  02/29/20 127 lb (57.6 kg)  10/01/19 129 lb 3.2 oz (58.6 kg)    GEN- The patient is well appearing, alert and oriented x 3 today.   HEENT: normocephalic, atraumatic; sclera clear, conjunctiva pink; hearing intact; oropharynx clear; neck supple  Lungs- Clear to ausculation bilaterally, normal work of breathing.  No wheezes, rales, rhonchi Heart- Bradycardic regular rate and rhythm  GI- soft, non-tender, non-distended, bowel sounds present  Extremities- no clubbing, cyanosis, or edema  MS- no significant deformity or atrophy Skin- warm and dry, no rash or lesion  Psych- euthymic mood, full affect Neuro- strength and sensation are intact   EKG:  EKG is ordered today. The ekg ordered today shows sinus brady, rate 49, IVCD, QRS 135mec  Recent Labs: 09/09/2019: BUN 23; Creatinine, Ser 1.46; Hemoglobin 12.7; Platelets 194; Potassium 4.7; Sodium 140    Other studies Reviewed: Additional studies/ records that were reviewed today include: Dr TLovena Leand Dr JDoug Souoffice notes  Assessment and Plan:  1.  Paroxysmal atrial fibrillation Maintaining SR by symptoms on low dose amiodarone Surveillance labs checked by PCP Continue Warfarin for CHADS2VASC of 4  2.  HTN Stable No change required today  3.  Severe AS s/p mechanical AVR Followed by Dr JMartiniqueContinue Warfarin  4. Sinus bradycardia Chronic and asymptomatic     Current medicines are reviewed at length with the patient today.   The patient does not have concerns regarding her medicines.  The following changes were made today:  none  Labs/ tests ordered today include:  Orders Placed This Encounter  Procedures  . EKG 12-Lead     Disposition:   Follow up with Dr JMartiniqueas scheduled, Dr TLovena Le1 year     Signed, AChanetta Marshall NP 03/14/2020 9:25 AM   [place]  [phone](office) ([phone](fax)

## 2020-03-14 ENCOUNTER — Ambulatory Visit: Payer: PPO | Admitting: Nurse Practitioner

## 2020-03-14 ENCOUNTER — Encounter: Payer: Self-pay | Admitting: Nurse Practitioner

## 2020-03-14 ENCOUNTER — Other Ambulatory Visit: Payer: Self-pay

## 2020-03-14 VITALS — BP 134/60 | HR 49 | Ht 59.75 in | Wt 124.2 lb

## 2020-03-14 DIAGNOSIS — Z952 Presence of prosthetic heart valve: Secondary | ICD-10-CM | POA: Diagnosis not present

## 2020-03-14 DIAGNOSIS — I480 Paroxysmal atrial fibrillation: Secondary | ICD-10-CM | POA: Diagnosis not present

## 2020-03-14 DIAGNOSIS — D6869 Other thrombophilia: Secondary | ICD-10-CM

## 2020-03-14 DIAGNOSIS — I10 Essential (primary) hypertension: Secondary | ICD-10-CM

## 2020-03-14 MED ORDER — AMIODARONE HCL 200 MG PO TABS: 200.0000 mg | ORAL_TABLET | Freq: Every day | ORAL | 0 refills | Status: DC

## 2020-03-14 MED ORDER — AMIODARONE HCL 200 MG PO TABS: 200.0000 mg | ORAL_TABLET | Freq: Every day | ORAL | 4 refills | Status: DC

## 2020-03-14 NOTE — Patient Instructions (Signed)
Medication Instructions:  none *If you need a refill on your cardiac medications before your next appointment, please call your pharmacy*   Lab Work: none If you have labs (blood work) drawn today and your tests are completely normal, you will receive your results only by: Marland Kitchen MyChart Message (if you have MyChart) OR . A paper copy in the mail If you have any lab test that is abnormal or we need to change your treatment, we will call you to review the results.   Testing/Procedures: none   Follow-Up: At [facility], you and your health needs are our priority.  As part of our continuing mission to provide you with exceptional heart care, we have created designated Provider Care Teams.  These Care Teams include your primary Cardiologist (physician) and Advanced Practice Providers (APPs -  Physician Assistants and Nurse Practitioners) who all work together to provide you with the care you need, when you need it.  We recommend signing up for the patient portal called "MyChart".  Sign up information is provided on this After Visit Summary.  MyChart is used to connect with patients for Virtual Visits (Telemedicine).  Patients are able to view lab/test results, encounter notes, upcoming appointments, etc.  Non-urgent messages can be sent to your provider as well.   To learn more about what you can do with MyChart, go to [email or web address].    Your next appointment:   1 year(s)  The format for your next appointment:   In Person  Provider:   Dr Lovena Le   Other Instructions

## 2020-03-24 ENCOUNTER — Emergency Department (HOSPITAL_COMMUNITY): Payer: PPO

## 2020-03-24 ENCOUNTER — Other Ambulatory Visit: Payer: Self-pay

## 2020-03-24 ENCOUNTER — Encounter (HOSPITAL_COMMUNITY): Payer: Self-pay | Admitting: *Deleted

## 2020-03-24 ENCOUNTER — Emergency Department (HOSPITAL_COMMUNITY)
Admission: EM | Admit: 2020-03-24 | Discharge: 2020-03-24 | Disposition: A | Discharge status: Home / Self Care | Payer: PPO | Attending: Emergency Medicine | Admitting: Emergency Medicine

## 2020-03-24 DIAGNOSIS — R519 Headache, unspecified: Secondary | ICD-10-CM | POA: Insufficient documentation

## 2020-03-24 DIAGNOSIS — Z79899 Other long term (current) drug therapy: Secondary | ICD-10-CM | POA: Diagnosis not present

## 2020-03-24 DIAGNOSIS — S0990XA Unspecified injury of head, initial encounter: Secondary | ICD-10-CM | POA: Diagnosis not present

## 2020-03-24 DIAGNOSIS — Y9389 Activity, other specified: Secondary | ICD-10-CM | POA: Diagnosis not present

## 2020-03-24 DIAGNOSIS — Y92238 Other place in hospital as the place of occurrence of the external cause: Secondary | ICD-10-CM | POA: Diagnosis not present

## 2020-03-24 DIAGNOSIS — S3993XA Unspecified injury of pelvis, initial encounter: Secondary | ICD-10-CM | POA: Diagnosis not present

## 2020-03-24 DIAGNOSIS — S299XXA Unspecified injury of thorax, initial encounter: Secondary | ICD-10-CM | POA: Diagnosis not present

## 2020-03-24 DIAGNOSIS — Y999 Unspecified external cause status: Secondary | ICD-10-CM | POA: Insufficient documentation

## 2020-03-24 DIAGNOSIS — I4891 Unspecified atrial fibrillation: Secondary | ICD-10-CM | POA: Diagnosis not present

## 2020-03-24 DIAGNOSIS — I129 Hypertensive chronic kidney disease with stage 1 through stage 4 chronic kidney disease, or unspecified chronic kidney disease: Secondary | ICD-10-CM | POA: Insufficient documentation

## 2020-03-24 DIAGNOSIS — G231 Progressive supranuclear ophthalmoplegia [Steele-Richardson-Olszewski]: Secondary | ICD-10-CM | POA: Insufficient documentation

## 2020-03-24 DIAGNOSIS — W19XXXA Unspecified fall, initial encounter: Secondary | ICD-10-CM

## 2020-03-24 DIAGNOSIS — Z7901 Long term (current) use of anticoagulants: Secondary | ICD-10-CM | POA: Insufficient documentation

## 2020-03-24 DIAGNOSIS — N183 Chronic kidney disease, stage 3 unspecified: Secondary | ICD-10-CM | POA: Diagnosis not present

## 2020-03-24 DIAGNOSIS — R296 Repeated falls: Secondary | ICD-10-CM | POA: Insufficient documentation

## 2020-03-24 DIAGNOSIS — S199XXA Unspecified injury of neck, initial encounter: Secondary | ICD-10-CM | POA: Diagnosis not present

## 2020-03-24 LAB — CBC
HCT: 36.7 % (ref 36.0–46.0)
Hemoglobin: 11.8 g/dL — ABNORMAL LOW (ref 12.0–15.0)
MCH: 34 pg (ref 26.0–34.0)
MCHC: 32.2 g/dL (ref 30.0–36.0)
MCV: 105.8 fL — ABNORMAL HIGH (ref 80.0–100.0)
Platelets: 177 10*3/uL (ref 150–400)
RBC: 3.47 MIL/uL — ABNORMAL LOW (ref 3.87–5.11)
RDW: 13.9 % (ref 11.5–15.5)
WBC: 5.5 10*3/uL (ref 4.0–10.5)
nRBC: 0 % (ref 0.0–0.2)

## 2020-03-24 LAB — BASIC METABOLIC PANEL
Anion gap: 9 (ref 5–15)
BUN: 29 mg/dL — ABNORMAL HIGH (ref 8–23)
CO2: 29 mmol/L (ref 22–32)
Calcium: 9.5 mg/dL (ref 8.9–10.3)
Chloride: 99 mmol/L (ref 98–111)
Creatinine, Ser: 1.61 mg/dL — ABNORMAL HIGH (ref 0.44–1.00)
GFR calc Af Amer: 35 mL/min — ABNORMAL LOW (ref >60)
GFR calc non Af Amer: 30 mL/min — ABNORMAL LOW (ref >60)
Glucose, Bld: 104 mg/dL — ABNORMAL HIGH (ref 70–99)
Potassium: 4.4 mmol/L (ref 3.5–5.1)
Sodium: 137 mmol/L (ref 135–145)

## 2020-03-24 LAB — PROTIME-INR
INR: 2 — ABNORMAL HIGH (ref 0.8–1.2)
Prothrombin Time: 22.9 seconds — ABNORMAL HIGH (ref 11.4–15.2)

## 2020-03-24 NOTE — ED Notes (Signed)
Pt back from CT without incidence

## 2020-03-24 NOTE — ED Triage Notes (Signed)
Pt was visiting her husband on the 6th floor when she fell and struck her head.  Pt has frequent falls and wears a helmet for her protection which she was wearing when she fell.  No LOC.  Pt is on coumadin.  Pt is alert and oriented. Pt states that she struck the back of her head, I can feel a slight bump.

## 2020-03-24 NOTE — ED Notes (Signed)
Pt's daughter requesting discharge. Dr. Roslynn Amble notified. Apologized to pt and daughter about wait times and explained MD is in emergency situation at this time

## 2020-03-24 NOTE — ED Notes (Signed)
Ambulated Pt in the hallway with walker. Pt did good with assist

## 2020-03-24 NOTE — ED Provider Notes (Signed)
[facility] EMERGENCY DEPARTMENT Provider Note   CSN: [id number] Arrival date & time: 03/24/20  1837     History Chief Complaint  Patient presents with  . Fall    [name] is a 79 y.o. female.  HPI Patient is an 79 year old female with a PMH of hypertension, A. fib with RVR on Coumadin, GERD, progressive supranuclear palsy, CKD stage III presenting to the ED today as a level 2 trauma following a fall.  Patient is accompanied by her daughter who reports that patient was visiting family in the hospital.  She went to the bathroom and family heard a loud noise.  They ran towards the bathroom and found her on the ground.  Patient had hit her head but unsure of loss of consciousness.  On arrival, patient complaining of pain where she hit her head.  She denies prodromal symptoms including lightheadedness, nausea, vomiting, chest pain or shortness of breath.  Patient's daughter says that she has frequent falls due to her progressive supranuclear palsy.    Past Medical History:  Diagnosis Date  . Arthropathy of cervical facet joint    C2-3  . At risk for falls   . BPV (benign positional vertigo)   . Cancer ([place]) 2004    breast-lumpectomy,nodes ,radiation,chemo  . Chronic neck pain   . CKD (chronic kidney disease), stage II   . Diverticulosis of colon   . Elevated troponin    a. 08/2016 in setting of PAF;  b. 08/2016 low risk MV w/ fixed septal defect (LBBB), EF 53%.  Marland Kitchen GERD (gastroesophageal reflux disease)   . Hiatal hernia   . History of aortic valve stenosis   . History of breast cancer per pt no recurrence   dx 2004-- Left breast DCIS (ER/PR+, HER2 negative) s/p  partial masectomy w/ sln bx and  chemoradiotion (completed 2004)  . History of colon polyps   . History of herpes zoster   . History of kidney stones   . HTN (hypertension)   . Hyperlipidemia   . Hypertrophy of inter-atrial septum    a. 09/2014 Echo: ? of atrial mass; b. 10/2014 Cardiac  MRI: no atrial septal mass - polypoid lipomatous hypertrophy of superior atrial septum noted-->Benign.  . Irritable bowel syndrome   . LBBB (left bundle branch block)   . PAF (paroxysmal atrial fibrillation) (HCC)    a. CHA2DS2VASc = 4-->coumadin.  . Parkinsonism [facility])    neurologist-  dr tat  . Personal history of chemotherapy 2004  . Personal history of radiation therapy 2004  . PONV (postoperative nausea and vomiting)   . PVC's (premature ventricular contractions)   . Right ureteral stone   . S/P aortic valve replacement with prosthetic valve    a. 04/13/2006 s/p St Jude mechnical AVR for severe AS;  b. 09/2014 Echo: EF 40-45%, gr1 DD, mild AI/MR, triv TR/PR.  . SUI (stress urinary incontinence, female)   . Symptomatic anemia   . Venous varices      Patient Active Problem List   Diagnosis Date Noted  . PSP (progressive supranuclear palsy) ([place]) 02/29/2020  . Atrial flutter ([place])   . Heme positive stool   . Symptomatic anemia 12/05/2018  . GI bleed 12/05/2018  . CKD (chronic kidney disease), stage III 12/05/2018  . Elevated d-dimer 12/05/2018  . SOB (shortness of breath) 12/05/2018  . Supratherapeutic INR 12/05/2018  . Parkinsonism ([place])   . Elevated troponin 09/19/2016  . Emesis 09/19/2016  . UTI (urinary tract infection)  09/19/2016  . Sinus bradycardia 09/16/2016  . Family history of colon cancer 07/30/2014  . Hiatal hernia 07/30/2014  . Chest pain 10/13/2011  . Disequilibrium 10/13/2011  . Heart valve replaced by other means 04/16/2011  . Chronic anticoagulation 03/17/2011  . S/P AVR (aortic valve replacement)   . PVC's (premature ventricular contractions)   . HTN (hypertension)   . Hypercholesteremia   . Atrial fibrillation with RVR ([place])   . GERD (gastroesophageal reflux disease)   . GERD 02/06/2010  . DIVERTICULOSIS-COLON 02/06/2010  . PERSONAL HX COLONIC POLYPS 02/06/2010  . MASTECTOMY, HX OF 02/06/2010    Past Surgical History:  Procedure Laterality Date   . ANTERIOR AND POSTERIOR REPAIR  1990   cystocele/ rectocele  . AORTIC VALVE REPLACEMENT  04/13/06   dr gerhardt   and Replacement Ascending Aorta (St. Jude mechanical prosthesis)  . APPENDECTOMY  child 1950  . BREAST LUMPECTOMY Left 01/11/2003   malignant  . BREAST SURGERY Left 2004  . CARDIAC CATHETERIZATION  02-22-2001   dr Martinique   minimal non-obstructive cad/  mild aortic stenosis and aortic root enlargement/  normal LVF, ef 65%  . CARDIAC CATHETERIZATION  03-22-2006    dr Martinique   no significant obstructive cad/  severe aortic stenosis and mild to moderate aortic root enlargement/  upper normal right heart pressures  . CARDIOVASCULAR STRESS TEST  09-30-2011   dr Martinique   lexiscan nuclear study w/ apical thinning  vs  small prior apical infarct w/ no significant ischemia/  hypokinesis of the distal septum and apex, ef 50%  . CARPAL TUNNEL RELEASE Bilateral left 09-09-2004/  right 2007  . CATARACT EXTRACTION W/ INTRAOCULAR LENS  IMPLANT, BILATERAL  2015  . COLONOSCOPY N/A 10/03/2014   Procedure: COLONOSCOPY;  Surgeon: [name], MD;  Location: [facility];  Service: Endoscopy;  Laterality: N/A;  . CYSTOSCOPY WITH RETROGRADE PYELOGRAM, URETEROSCOPY AND STENT PLACEMENT Right 09/17/2016   Procedure: CYSTOSCOPY WITH RIGHT RETROGRADE PYELOGRAM, RIGHT URETEROSCOPY AND STENT PLACEMENT LASER LITHOTRIPSY, RIGHT STENT PLACEMENT;  Surgeon: [name], MD;  Location[facility];  Service: Urology;  Laterality: Right;  . ESOPHAGEAL MANOMETRY N/A 08/12/2014   Procedure: ESOPHAGEAL MANOMETRY (EM);  Surgeon: [name], MD;  Location: [facility];  Service: Endoscopy;  Laterality: N/A;  . ESOPHAGOGASTRODUODENOSCOPY N/A 10/03/2014   Procedure: ESOPHAGOGASTRODUODENOSCOPY (EGD);  Surgeon: [name], MD;  Location: [facility];  Service: Endoscopy;  Laterality: N/A;  . ESOPHAGOGASTRODUODENOSCOPY N/A 12/08/2018   Procedure: ESOPHAGOGASTRODUODENOSCOPY (EGD);  Surgeon:  Doran Stabler, MD;  Location: [facility];  Service: Gastroenterology;  Laterality: N/A;  . [place]  . FOOT SURGERY Right 2013   hammertoe x2  . HOLMIUM LASER APPLICATION Right 0/76/2263   Procedure: HOLMIUM LASER APPLICATION;  Surgeon: [name], MD;  Location: [facility];  Service: Urology;  Laterality: Right;  . PERCUTANEOUS NEPHROSTOLITHOTOMY  1993  . STONE EXTRACTION WITH BASKET Right 09/17/2016   Procedure: STONE EXTRACTION WITH BASKET;  Surgeon: [name], MD;  Location: [facility];  Service: Urology;  Laterality: Right;  . TOTAL ABDOMINAL HYSTERECTOMY  1971   w/ Bilateral Salpingoophorectomy  . TRANSTHORACIC ECHOCARDIOGRAM  10/24/2014   dr Martinique   hypokinesis of the basal and mid inferoseptal and inferior walls,  ef 40-45%,  grade 1 diastolic dysfunction/  mechanical bileaflet aortic valve noted w/ mild regur., peak grandient 30mHg, valve area 1.35cm^2/  severe MV calcification without stenosis w/ mild  regurg., peak gradient 57mHg/  mild LAE/  poss. atrial mass (MRI showed benign lipomatous hypertrophy of atrial septum) /Frazier ButtPR/mild TR     OB History   No obstetric history on file.     Family History  Problem Relation Age of Onset  . Heart disease Mother   . Heart disease Father 735      CABG  . Other Brother        AVR  . Healthy Child     Social History   Tobacco Use  . Smoking status: Never Smoker  . Smokeless tobacco: Never Used  Substance Use Topics  . Alcohol use: No    Alcohol/week: 0.0 standard drinks  . Drug use: No    Home Medications Prior to Admission medications   Medication Sig Start Date End Date Taking? Authorizing Provider  acetaminophen (TYLENOL) 500 MG tablet Take 1,000 mg by mouth every 6 (six) hours as needed (head pain).     [provider]  amiodarone (PACERONE) 200 MG tablet Take 1 tablet (200 mg total) by mouth daily. Please make  overdue appt with Dr. TLovena Lebefore anymore refills. 1st attempt 03/14/20   SPatsey Berthold NP  calcium carbonate (TUMS - DOSED IN MG ELEMENTAL CALCIUM) 500 MG chewable tablet Chew 1 tablet by mouth daily.    [provider]  calcium citrate-vitamin D (CITRACAL+D) 315-200 MG-UNIT per tablet Take 1 tablet by mouth 3 (three) times daily.    [provider]  carbidopa-levodopa (SINEMET IR) 25-100 MG tablet TAKE 2 TABLETS BY MOUTH THREE TIMES DAILY 03/10/20   Tat, REustace Quail DO  Cholecalciferol (VITAMIN D3) 2000 units TABS Take 1 capsule by mouth daily.    [provider]  famotidine-calcium carbonate-magnesium hydroxide (PEPCID COMPLETE) 10-800-165 MG CHEW chewable tablet Chew 1 tablet by mouth daily as needed (heartburn).    [provider]  KLOR-CON M20 20 MEQ tablet Take 20 mEq by mouth daily. 05/26/17   [provider]  KRILL OIL PO Take 1 capsule by mouth daily.     [provider]  Melatonin 3 MG TABS Take 3 mg by mouth at bedtime as needed (sleep).     [provider]  metoprolol tartrate (LOPRESSOR) 25 MG tablet Take 1 tablet (25 mg total) by mouth daily as needed (if heart rate is above 110). 01/15/19 02/29/20  JMartinique Peter M, MD  Multiple Vitamin (MULTIVITAMIN) tablet Take 1 tablet by mouth daily at 12 noon.     [provider]  Multiple Vitamins-Minerals (VISION-VITE PRESERVE PO) Take 1 tablet by mouth 2 (two) times daily.    [provider]  pantoprazole (PROTONIX) 40 MG tablet Take 40 mg by mouth daily. 06/07/17   [provider]  Probiotic Product (PROBIOTIC-10 PO) Take 1 capsule by mouth daily.     [provider]  simvastatin (ZOCOR) 20 MG tablet Take 20 mg by mouth every evening.    [provider]  vitamin B-12 (CYANOCOBALAMIN) 1000 MCG tablet Take 1,000 mcg by mouth every morning.    [provider]  warfarin (COUMADIN) 5 MG tablet Take 5 mg by mouth daily. Patient is taking  517mon Monday and Friday. Patient is taking 2.5 mg on Tues.,Wed., Thurs., Sat., and Sun    [provider]    Allergies    Demerol [meperidine], Oxycodone, Adhesive [tape], Ivp dye [iodinated diagnostic agents], and Phenergan [promethazine]  Review of Systems   Review of Systems  Constitutional: Negative for chills  and fever.  HENT: Negative for rhinorrhea and sore throat.   Eyes: Negative for photophobia and visual disturbance.  Respiratory: Negative for cough and shortness of breath.   Cardiovascular: Negative for chest pain and palpitations.  Gastrointestinal: Negative for abdominal pain, diarrhea, nausea and vomiting.  Genitourinary: Negative for dysuria and hematuria.  Musculoskeletal: Negative for arthralgias and back pain.  Skin: Negative for rash and wound.  Neurological: Positive for headaches. Negative for weakness.  Psychiatric/Behavioral: Negative for agitation.  All other systems reviewed and are negative.   Physical Exam Updated Vital Signs BP (!) 161/71 (BP Location: Right Arm)   Pulse (!) 54   Temp 99 F (37.2 C) (Oral)   Resp 14   Ht 4' 11.75" (1.518 m)   Wt 56.2 kg   SpO2 100%   BMI 24.42 kg/m   Physical Exam Vitals and nursing note reviewed.  Constitutional:      General: She is not in acute distress.    Appearance: Normal appearance. She is well-developed. She is not ill-appearing.  HENT:     Head: Normocephalic and atraumatic.     Right Ear: External ear normal.     Left Ear: External ear normal.     Nose: Nose normal. No congestion or rhinorrhea.     Mouth/Throat:     Mouth: Mucous membranes are moist.     Pharynx: Oropharynx is clear.  Eyes:     Extraocular Movements: Extraocular movements intact.     Pupils: Pupils are equal, round, and reactive to light.  Cardiovascular:     Rate and Rhythm: Normal rate and regular rhythm.     Pulses: Normal pulses.     Heart sounds: Normal heart sounds.  Pulmonary:     Effort: Pulmonary effort  is normal. No respiratory distress.     Breath sounds: Normal breath sounds. No stridor. No wheezing, rhonchi or rales.  Abdominal:     General: There is no distension.     Palpations: Abdomen is soft.     Tenderness: There is no abdominal tenderness.  Musculoskeletal:        General: Normal range of motion.     Cervical back: Normal range of motion and neck supple.     Right lower leg: No edema.     Left lower leg: No edema.  Skin:    General: Skin is warm and dry.     Capillary Refill: Capillary refill takes less than 2 seconds.  Neurological:     General: No focal deficit present.     Mental Status: She is alert and oriented to person, place, and time. Mental status is at baseline.  Psychiatric:        Mood and Affect: Mood normal.     ED Results / Procedures / Treatments   Labs (all labs ordered are listed, but only abnormal results are displayed) Labs Reviewed  CBC - Abnormal; Notable for the following components:      Result Value   RBC 3.47 (*)    Hemoglobin 11.8 (*)    MCV 105.8 (*)    All other components within normal limits  BASIC METABOLIC PANEL - Abnormal; Notable for the following components:   Glucose, Bld 104 (*)    BUN 29 (*)    Creatinine, Ser 1.61 (*)    GFR calc non Af Amer 30 (*)    GFR calc Af Amer 35 (*)    All other components within normal limits  PROTIME-INR - Abnormal; Notable for  the following components:   Prothrombin Time 22.9 (*)    INR 2.0 (*)    All other components within normal limits    EKG None  Radiology CT Head Wo Contrast  Result Date: 03/24/2020 CLINICAL DATA:  [place], anticoagulated EXAM: CT HEAD WITHOUT CONTRAST TECHNIQUE: Contiguous axial images were obtained from the base of the skull through the vertex without intravenous contrast. COMPARISON:  09/09/2019 FINDINGS: Brain: No acute infarct or hemorrhage. Lateral ventricles and midline structures are unremarkable. No acute extra-axial fluid collections. No mass effect.  Vascular: No hyperdense vessel or unexpected calcification. Skull: Normal. Negative for fracture or focal lesion. Sinuses/Orbits: No acute finding. Other: None IMPRESSION: 1. No acute intracranial process. Electronically Signed   By: Randa Ngo M.D.   On: 03/24/2020 20:40   CT Cervical Spine Wo Contrast  Result Date: 03/24/2020 CLINICAL DATA:  [place], trauma EXAM: CT CERVICAL SPINE WITHOUT CONTRAST TECHNIQUE: Multidetector CT imaging of the cervical spine was performed without intravenous contrast. Multiplanar CT image reconstructions were also generated. COMPARISON:  09/09/2019 FINDINGS: Alignment: There is chronic reversal of normal cervical lordosis at the cervicothoracic junction. Otherwise alignment is anatomic. Skull base and vertebrae: No acute displaced fractures. Soft tissues and spinal canal: No prevertebral fluid or swelling. No visible canal hematoma. Disc levels: Mild spondylosis at C5-6 and C6-7 unchanged. No significant bony encroachment on the central canal or neural foramina. Upper chest: Airway is patent.  Lung apices are clear. Other: Reconstructed images demonstrate no additional findings. IMPRESSION: 1. Stable cervical spondylosis.  No acute fracture. Electronically Signed   By: Randa Ngo M.D.   On: 03/24/2020 20:38   DG Pelvis Portable  Result Date: 03/24/2020 CLINICAL DATA:  Pain status post fall EXAM: PORTABLE PELVIS 1-2 VIEWS COMPARISON:  None. FINDINGS: There are degenerative changes of the lower lumbar spine. Mild-to-moderate degenerative changes are noted of both hips. There is no acute displaced fracture or dislocation. Calcifications project over the patient's pelvis and are favored to represent phleboliths. IMPRESSION: No acute displaced fracture or dislocation. Degenerative changes of both hips and lower lumbar spine. Electronically Signed   By: Constance Holster M.D.   On: 03/24/2020 21:22   DG Chest Portable 1 View  Result Date: 03/24/2020 CLINICAL DATA:  Fall.  EXAM: PORTABLE CHEST 1 VIEW COMPARISON:  June 26, 2019 FINDINGS: The patient is status post prior median sternotomy. The heart size remains enlarged. There are atherosclerotic changes of the thoracic aorta. There is no pneumothorax. No large pleural effusion. There is a healing right-sided rib fracture involving the eighth rib posterolaterally on the right. IMPRESSION: No active disease. Electronically Signed   By: Constance Holster M.D.   On: 03/24/2020 21:26    Procedures Procedures (including critical care time)  Medications Ordered in ED Medications - No data to display  ED Course  I have reviewed the triage vital signs and the nursing notes.  Pertinent labs & imaging results that were available during my care of the patient were reviewed by me and considered in my medical decision making (see chart for details).    MDM Rules/Calculators/A&P                     Patient is an 79 year old female with a PMH of hypertension, A. fib with RVR on Coumadin, GERD, progressive supranuclear palsy, CKD stage III presenting to the ED today as a level 2 trauma following a fall.  On arrival, ABCs intact.  GCS 15. Vital signs stable.  Afebrile. Physical  exam unremarkable.  Given the fact that patient hit her head and takes Coumadin, will obtain CT head and C-spine.  Chest x-ray and pelvis x-ray also collected.  All imaging negative for acute injuries.  CBC unremarkable.  BMP with BUN 29, creatinine 1.6 which is mildly elevated from previous.  INR 2.0.  Patient able to stand up and walk without instability.  No further work-up or intervention provided while in the ED.  At this time, patient appears stable for discharge.  Provided strict return precautions.  Encouraged her to follow-up with her PCP in 2 to 3 days for reevaluation.  Patient assessed and evaluated with Dr. Roslynn Amble.  Nadeen Landau, MD   Final Clinical Impression(s) / ED Diagnoses Final diagnoses:  Fall, initial encounter    Rx / DC  Orders ED Discharge Orders    None       Nadeen Landau, MD 03/25/20 1610    Lucrezia Starch, MD 03/25/20 [phone]

## 2020-03-24 NOTE — ED Notes (Signed)
X-ray at bedside

## 2020-03-24 NOTE — Progress Notes (Signed)
Chaplain responded to Trauma Level 2.  Daughter was outside room, explained how her mother had fallen in the room of patient's husband, [name], who had surgery today for complications with recurrent skin cancer lesions; he is up on 6[address]. Offered emotional support and provided refreshment to dau. Then, checked in briefly with pt, who appeared tired. Note: TODAY IS PT'S 79th BIRTHDAY.    Please call if ongoing services are needed or requested. [name] [phone]      03/24/20 1950  Clinical Encounter Type  Visited With Patient and family together  Visit Type Initial;Psychological support  Referral From Care management  Consult/Referral To Chaplain  Stress Factors  Patient Stress Factors Health changes;Family relationships  Family Stress Factors Family relationships

## 2020-03-24 NOTE — ED Notes (Signed)
Pt ambulatory to and from BR with walker without issues

## 2020-03-24 NOTE — ED Notes (Addendum)
Pt and daughter still requesting discharge. Informed that the doctor is still dealing with critical emergency. Pt and daughter informed that they can view discharge summary on mychart. Pt and daughter  verbalize understanding. Ok per Dr. Roslynn Amble for pt to leave. Patient and daughter verbalize understanding of verbal  discharge instructions. Opportunity for questioning and answers were provided.  All questions answered.  Armband removed by staff, pt discharged from ED. Wheeled in own wheelchair and taken home by daughter

## 2020-03-24 NOTE — ED Notes (Signed)
Pt wheeled to ED Rm 22 from [place]. Pt presents s/p fall on coumadin. Pt was visiting her husband who is upstairs as inpatient after having surgery. Pt fell in bathroom and was brought down here to be evaluated. Pt has hx of frequent falls and wears a helmet. Pt hit back of her head. No open wounds noted. Pt c/o some head/neck pain. A&Ox4, speaking in full sentences. Breathing easy, non-labored. Neuro checks stable and intact. GCS 15

## 2020-03-24 NOTE — Progress Notes (Signed)
Orthopedic Tech Progress Note Patient Details:  [name] October 29, 1941 [id number] Level 2 trauma  Patient ID: [name], female   DOB: 1941/08/09, 79 y.o.   MRN: [id number]   [name] 03/24/2020, 7:44 PM

## 2020-03-24 NOTE — ED Notes (Signed)
PIV initiated, 18G to [place]. IV flushes with 10 cc NS without s/s of infiltration. Positive blood return noted. Secured with tape and tegaderm. Labs drawn, labeled with 2 pt identifiers, and sent to lab

## 2020-03-24 NOTE — ED Notes (Signed)
CT called, ready for pt. Pt taken to CT in NAD

## 2020-03-27 ENCOUNTER — Telehealth: Payer: Self-pay | Admitting: Cardiology

## 2020-03-27 NOTE — Telephone Encounter (Signed)
Spoke to patient's daughter Neoma Laming advised ok for her to come back at Dr.Jordan's appointment 04/01/20.

## 2020-03-27 NOTE — Telephone Encounter (Signed)
Routed to primary nurse to advise on request for 04/01/20 appointment

## 2020-03-27 NOTE — Telephone Encounter (Signed)
New Message   Pts daughter is calling and says she will need to assist the pt to her appt because she has PSP, and her balance is not great.   Please advise

## 2020-03-29 NOTE — Progress Notes (Signed)
Cardiology Office Note   Date:  04/01/2020   ID:  [name] 05-Sep-1941, MRN [id number]  PCP:  Leanna Battles, MD  Cardiologist:   Terry Abila Martinique, MD   Chief Complaint  Patient presents with  . Atrial Fibrillation      History of Present Illness: [name] is a 79 y.o. female with with a h/o severe AS s/p SJM mech AVR in 2007. She also has a h/o PAF and is chronically anticoagulated with coumadin. She has a history of bradycardia on beta blocker.  September 2017 following a urologic procedure she developed abd pain, nausea, c/p, sob, and palpitations, prompting her to call EMS. She was found to be in AF with RVR, which later converted to sinus rhythm. She was admitted to [facility] and had mild troponin elevation. She underwent stress testing, which was low risk. She was seen in October 2017by Dr Caryl Comes who felt her Afib episodes were infrequent and recommended following for now. If episodes became more frequent then consider resumption of beta blocker or AAD without rate slowing effects such as Tikosyn.   She does have a history of atypical Parikinsons and progressive supranuclear palsy followed by Neurology.   Admitted 12/10-12/14/2019 for Afib RVR, chest pain, anemia (Hgb 5.6) following a fall/trauma, extensive hematoma left chest. She became volume overloaded post transfusion>>BiPAP>>diuresed, low-dose amio started for elevated HR.  Seen by Dr Lovena Le in March 2020 and appeared to be maintaining NSR on amiodarone. She has multiple falls.  She was seen in the ED on 09/09/19 with mechanical fall and facial laceration. CT negative for bleed. INR therapeutic. She denies any significant palpitations. No syncope. No chest pain or dyspnea.   When seen in [facility] in March 2021 only noted 2 episodes of Afib lasting minutes. Seen in ED on March 29 following a fall. CT and Xrays negative.   She states on follow up today she is doing well. Rare palpitations.  No chest pain or dyspnea. Mild HA only lasting 10 minutes.     Past Medical History:  Diagnosis Date  . Arthropathy of cervical facet joint    C2-3  . At risk for falls   . BPV (benign positional vertigo)   . Cancer ([place]) 2004    breast-lumpectomy,nodes ,radiation,chemo  . Chronic neck pain   . CKD (chronic kidney disease), stage II   . Diverticulosis of colon   . Elevated troponin    a. 08/2016 in setting of PAF;  b. 08/2016 low risk MV w/ fixed septal defect (LBBB), EF 53%.  Marland Kitchen GERD (gastroesophageal reflux disease)   . Hiatal hernia   . History of aortic valve stenosis   . History of breast cancer per pt no recurrence   dx 2004-- Left breast DCIS (ER/PR+, HER2 negative) s/p  partial masectomy w/ sln bx and  chemoradiotion (completed 2004)  . History of colon polyps   . History of herpes zoster   . History of kidney stones   . HTN (hypertension)   . Hyperlipidemia   . Hypertrophy of inter-atrial septum    a. 09/2014 Echo: ? of atrial mass; b. 10/2014 Cardiac MRI: no atrial septal mass - polypoid lipomatous hypertrophy of superior atrial septum noted-->Benign.  . Irritable bowel syndrome   . LBBB (left bundle branch block)   . PAF (paroxysmal atrial fibrillation) (HCC)    a. CHA2DS2VASc = 4-->coumadin.  . Parkinsonism [facility])    neurologist-  dr tat  . Personal history of chemotherapy  2004  . Personal history of radiation therapy 2004  . PONV (postoperative nausea and vomiting)   . PVC's (premature ventricular contractions)   . Right ureteral stone   . S/P aortic valve replacement with prosthetic valve    a. 04/13/2006 s/p St Jude mechnical AVR for severe AS;  b. 09/2014 Echo: EF 40-45%, gr1 DD, mild AI/MR, triv TR/PR.  . SUI (stress urinary incontinence, female)   . Symptomatic anemia   . Venous varices      Past Surgical History:  Procedure Laterality Date  . ANTERIOR AND POSTERIOR REPAIR  1990   cystocele/ rectocele  . AORTIC VALVE REPLACEMENT  04/13/06   dr gerhardt     and Replacement Ascending Aorta (St. Jude mechanical prosthesis)  . APPENDECTOMY  child 1950  . BREAST LUMPECTOMY Left 01/11/2003   malignant  . BREAST SURGERY Left 2004  . CARDIAC CATHETERIZATION  02-22-2001   dr Martinique   minimal non-obstructive cad/  mild aortic stenosis and aortic root enlargement/  normal LVF, ef 65%  . CARDIAC CATHETERIZATION  03-22-2006    dr Martinique   no significant obstructive cad/  severe aortic stenosis and mild to moderate aortic root enlargement/  upper normal right heart pressures  . CARDIOVASCULAR STRESS TEST  09-30-2011   dr Martinique   lexiscan nuclear study w/ apical thinning  vs  small prior apical infarct w/ no significant ischemia/  hypokinesis of the distal septum and apex, ef 50%  . CARPAL TUNNEL RELEASE Bilateral left 09-09-2004/  right 2007  . CATARACT EXTRACTION W/ INTRAOCULAR LENS  IMPLANT, BILATERAL  2015  . COLONOSCOPY N/A 10/03/2014   Procedure: COLONOSCOPY;  Surgeon: [name], MD;  Location: [facility];  Service: Endoscopy;  Laterality: N/A;  . CYSTOSCOPY WITH RETROGRADE PYELOGRAM, URETEROSCOPY AND STENT PLACEMENT Right 09/17/2016   Procedure: CYSTOSCOPY WITH RIGHT RETROGRADE PYELOGRAM, RIGHT URETEROSCOPY AND STENT PLACEMENT LASER LITHOTRIPSY, RIGHT STENT PLACEMENT;  Surgeon: [name], MD;  Location[facility];  Service: Urology;  Laterality: Right;  . ESOPHAGEAL MANOMETRY N/A 08/12/2014   Procedure: ESOPHAGEAL MANOMETRY (EM);  Surgeon: [name], MD;  Location: [facility];  Service: Endoscopy;  Laterality: N/A;  . ESOPHAGOGASTRODUODENOSCOPY N/A 10/03/2014   Procedure: ESOPHAGOGASTRODUODENOSCOPY (EGD);  Surgeon: [name], MD;  Location[facility] ENDOSCOPY;  Service: Endoscopy;  Laterality: N/A;  . ESOPHAGOGASTRODUODENOSCOPY N/A 12/08/2018   Procedure: ESOPHAGOGASTRODUODENOSCOPY (EGD);  Surgeon: Doran Stabler, MD;  Location: [place];  Service: Gastroenterology;  Laterality: N/A;  . [place]  . FOOT SURGERY Right 2013   hammertoe x2  . HOLMIUM LASER APPLICATION Right 7/78/2423   Procedure: HOLMIUM LASER APPLICATION;  Surgeon: [name], MD;  Location: [facility];  Service: Urology;  Laterality: Right;  . PERCUTANEOUS NEPHROSTOLITHOTOMY  1993  . STONE EXTRACTION WITH BASKET Right 09/17/2016   Procedure: STONE EXTRACTION WITH BASKET;  Surgeon: [name], MD;  Location: [facility];  Service: Urology;  Laterality: Right;  . TOTAL ABDOMINAL HYSTERECTOMY  1971   w/ Bilateral Salpingoophorectomy  . TRANSTHORACIC ECHOCARDIOGRAM  10/24/2014   dr Martinique   hypokinesis of the basal and mid inferoseptal and inferior walls,  ef 40-45%,  grade 1 diastolic dysfunction/  mechanical bileaflet aortic valve noted w/ mild regur., peak grandient 16mHg, valve area 1.35cm^2/  severe MV calcification without stenosis w/ mild regurg., peak gradient 377mg/  mild LAE/  poss. atrial mass (MRI showed benign lipomatous hypertrophy of atrial  septum) Frazier Butt PR/mild TR     Current Outpatient Medications  Medication Sig Dispense Refill  . acetaminophen (TYLENOL) 500 MG tablet Take 1,000 mg by mouth every 6 (six) hours as needed (head pain).     Marland Kitchen amiodarone (PACERONE) 200 MG tablet Take 1 tablet (200 mg total) by mouth daily. Please make overdue appt with Dr. Lovena Le before anymore refills. 1st attempt 90 tablet 4  . calcium carbonate (TUMS - DOSED IN MG ELEMENTAL CALCIUM) 500 MG chewable tablet Chew 1 tablet by mouth daily as needed for indigestion or heartburn.     . calcium citrate-vitamin D (CITRACAL+D) 315-200 MG-UNIT per tablet Take 1 tablet by mouth 3 (three) times daily.    . carbidopa-levodopa (SINEMET IR) 25-100 MG tablet TAKE 2 TABLETS BY MOUTH THREE TIMES DAILY (Patient taking differently: Take 2 tablets by mouth 3 (three) times daily. ) 540 tablet 2  . Cholecalciferol (VITAMIN D3) 2000 units TABS Take 1 capsule by mouth daily.      . famotidine-calcium carbonate-magnesium hydroxide (PEPCID COMPLETE) 10-800-165 MG CHEW chewable tablet Chew 1 tablet by mouth daily as needed (heartburn).    Marland Kitchen KLOR-CON M20 20 MEQ tablet Take 20 mEq by mouth daily.    Marland Kitchen KRILL OIL PO Take 1 capsule by mouth daily.     Marland Kitchen loratadine (CLARITIN) 10 MG tablet Take 10 mg by mouth daily.    . Melatonin 3 MG TABS Take 3 mg by mouth at bedtime as needed (sleep).     . Multiple Vitamin (MULTIVITAMIN) tablet Take 1 tablet by mouth daily at 12 noon.     . Multiple Vitamins-Minerals (VISION-VITE PRESERVE PO) Take 1 tablet by mouth 2 (two) times daily.    . pantoprazole (PROTONIX) 40 MG tablet Take 40 mg by mouth daily.    . Probiotic Product (PROBIOTIC-10 PO) Take 1 capsule by mouth daily.     . simvastatin (ZOCOR) 20 MG tablet Take 20 mg by mouth every evening.    . vitamin B-12 (CYANOCOBALAMIN) 1000 MCG tablet Take 1,000 mcg by mouth every morning.    . warfarin (COUMADIN) 5 MG tablet Take 5 mg by mouth daily. Patient is taking 2.71m on Monday and Friday. Patient is taking 5 mg on Tues.,Wed., Thurs., Sat., and Sun    . metoprolol tartrate (LOPRESSOR) 25 MG tablet Take 1 tablet (25 mg total) by mouth daily as needed (if heart rate is above 110). 90 tablet 3   No current facility-administered medications for this visit.    Allergies:   Demerol [meperidine], Oxycodone, Adhesive [tape], Ivp dye [iodinated diagnostic agents], and Phenergan [promethazine]    Social History:  The patient  reports that she has never smoked. She has never used smokeless tobacco. She reports that she does not drink alcohol or use drugs.   Family History:  The patient's family history includes Healthy in her child; Heart disease in her mother; Heart disease (age of onset: 770 in her father; Other in her brother.    ROS:  Please see the history of present illness.   Otherwise, review of systems are positive for none.   All other systems are reviewed and negative.    PHYSICAL  EXAM: VS:  BP 128/78   Pulse 60   Ht 4' 11.75" (1.518 m)   Wt 127 lb (57.6 kg)   SpO2 94%   BMI 25.01 kg/m  , BMI Body mass index is 25.01 kg/m. GEN: Well nourished, well developed, in no acute distress  HEENT: normal  Neck: no JVD, carotid bruits, or masses Cardiac: RRR; good mechanical AV click. SEM RUSB Respiratory:  clear to auscultation bilaterally, normal work of breathing GI: soft, nontender, nondistended, + BS MS: no deformity or atrophy  Skin: warm and dry, no rash Neuro:  Strength and sensation are intact Psych: euthymic mood, full affect   EKG:  EKG is not ordered today. The ekg ordered today demonstrates N/A   Recent Labs: 03/24/2020: BUN 29; Creatinine, Ser 1.61; Hemoglobin 11.8; Platelets 177; Potassium 4.4; Sodium 137    Lipid Panel    Component Value Date/Time   CHOL 145 09/30/2011 0415   TRIG 42 09/30/2011 0415   HDL 77 09/30/2011 0415   CHOLHDL 1.9 09/30/2011 0415   VLDL 8 09/30/2011 0415   LDLCALC 60 09/30/2011 0415    Dated 06/12/19: cholesterol 147, triglycerides 74, HDL 64, LDL 68.   Wt Readings from Last 3 Encounters:  04/01/20 127 lb (57.6 kg)  03/24/20 124 lb (56.2 kg)  03/14/20 124 lb 3.2 oz (56.3 kg)      Other studies Reviewed: Additional studies/ records that were reviewed today include:   11/17/18: TTE Study Conclusions - Left ventricle: The cavity size was normal. Wall thickness was normal. Systolic function was mildly reduced. The estimated ejection fraction was in the range of 45% to 50%. Features are consistent with a pseudonormal left ventricular filling pattern, with concomitant abnormal relaxation and increased filling pressure (grade 2 diastolic dysfunction). - Aortic valve: A mechanical prosthesis was present. There was mild regurgitation. Valve area (VTI): 1.43 cm^2. Valve area (Vmax): 1.28 cm^2. Valve area (Vmean): 1.2 cm^2. - Mitral valve: Mildly to moderately calcified annulus. Valve area by  continuity equation (using LVOT flow): 1.55 cm^2. - Left atrium: The atrium was mildly dilated.   09/21/18 stress myoview IMPRESSION: 1. No evidence reversible ischemia. Fixed defect in the septal wall with differential including infarction versus artifact from LEFT bundle branch block. 2. Septal dyskinesia in patient with LEFT bundle branch block. 3. Left ventricular ejection fraction 53% 4. Non invasive risk stratification*: Low   ASSESSMENT AND PLAN:  1. PAF/Tachy-brady syndrome: Pt is now on amiodarone and appears to be maintaining NSR well.   Previously she had history of severe brady on beta blockers. Will check LFTs and TSH today on amiodarone.   2. S/P SJM AVR: Stable on echo in September 2019.  continueChronic coumadin. SBE prophylaxis. Good valve click on exam.  3. Essential HTN: Controlled  4. IEP:PIRJJOACZYSAYT. Cont statin. Update labs    Current medicines are reviewed at length with the patient today.  The patient does not have concerns regarding medicines.  The following changes have been made:  no change  Labs/ tests ordered today include:   Orders Placed This Encounter  Procedures  . Basic metabolic panel  . Lipid panel  . Hepatic function panel  . TSH     Disposition:   FU with me in 6 months  Signed, Toriano Aikey Martinique, MD  04/01/2020 8:25 AM    [facility] [address] [place], [place], [address] Phone [phone], Fax [phone]

## 2020-04-01 ENCOUNTER — Encounter: Payer: Self-pay | Admitting: Cardiology

## 2020-04-01 ENCOUNTER — Other Ambulatory Visit: Payer: Self-pay

## 2020-04-01 ENCOUNTER — Ambulatory Visit (INDEPENDENT_AMBULATORY_CARE_PROVIDER_SITE_OTHER): Payer: PPO | Admitting: Cardiology

## 2020-04-01 VITALS — BP 128/78 | HR 60 | Ht 59.75 in | Wt 127.0 lb

## 2020-04-01 DIAGNOSIS — I480 Paroxysmal atrial fibrillation: Secondary | ICD-10-CM

## 2020-04-01 DIAGNOSIS — E7800 Pure hypercholesterolemia, unspecified: Secondary | ICD-10-CM

## 2020-04-01 DIAGNOSIS — Z952 Presence of prosthetic heart valve: Secondary | ICD-10-CM | POA: Diagnosis not present

## 2020-04-01 DIAGNOSIS — I10 Essential (primary) hypertension: Secondary | ICD-10-CM

## 2020-04-01 LAB — LIPID PANEL
Chol/HDL Ratio: 2 ratio (ref 0.0–4.4)
Cholesterol, Total: 182 mg/dL (ref 100–199)
HDL: 89 mg/dL (ref >39)
LDL Chol Calc (NIH): 77 mg/dL (ref 0–99)
Triglycerides: 92 mg/dL (ref 0–149)
VLDL Cholesterol Cal: 16 mg/dL (ref 5–40)

## 2020-04-01 LAB — BASIC METABOLIC PANEL
BUN/Creatinine Ratio: 15 (ref 12–28)
BUN: 20 mg/dL (ref 8–27)
CO2: 27 mmol/L (ref 20–29)
Calcium: 10.2 mg/dL (ref 8.7–10.3)
Chloride: 102 mmol/L (ref 96–106)
Creatinine, Ser: 1.36 mg/dL — ABNORMAL HIGH (ref 0.57–1.00)
GFR calc Af Amer: 42 mL/min/{1.73_m2} — ABNORMAL LOW (ref >59)
GFR calc non Af Amer: 37 mL/min/{1.73_m2} — ABNORMAL LOW (ref >59)
Glucose: 82 mg/dL (ref 65–99)
Potassium: 4.8 mmol/L (ref 3.5–5.2)
Sodium: 143 mmol/L (ref 134–144)

## 2020-04-01 LAB — HEPATIC FUNCTION PANEL
ALT: 12 IU/L (ref 0–32)
AST: 57 IU/L — ABNORMAL HIGH (ref 0–40)
Albumin: 4 g/dL (ref 3.7–4.7)
Alkaline Phosphatase: 100 IU/L (ref 39–117)
Bilirubin Total: 0.4 mg/dL (ref 0.0–1.2)
Bilirubin, Direct: 0.14 mg/dL (ref 0.00–0.40)
Total Protein: 6.5 g/dL (ref 6.0–8.5)

## 2020-04-01 LAB — TSH: TSH: 4.37 u[IU]/mL (ref 0.450–4.500)

## 2020-04-08 DIAGNOSIS — C50912 Malignant neoplasm of unspecified site of left female breast: Secondary | ICD-10-CM | POA: Diagnosis not present

## 2020-04-10 DIAGNOSIS — I480 Paroxysmal atrial fibrillation: Secondary | ICD-10-CM | POA: Diagnosis not present

## 2020-04-10 DIAGNOSIS — Z7901 Long term (current) use of anticoagulants: Secondary | ICD-10-CM | POA: Diagnosis not present

## 2020-04-10 DIAGNOSIS — I358 Other nonrheumatic aortic valve disorders: Secondary | ICD-10-CM | POA: Diagnosis not present

## 2020-05-13 DIAGNOSIS — I358 Other nonrheumatic aortic valve disorders: Secondary | ICD-10-CM | POA: Diagnosis not present

## 2020-05-13 DIAGNOSIS — Z7901 Long term (current) use of anticoagulants: Secondary | ICD-10-CM | POA: Diagnosis not present

## 2020-05-13 DIAGNOSIS — I480 Paroxysmal atrial fibrillation: Secondary | ICD-10-CM | POA: Diagnosis not present

## 2020-06-13 DIAGNOSIS — E7849 Other hyperlipidemia: Secondary | ICD-10-CM | POA: Diagnosis not present

## 2020-06-13 DIAGNOSIS — M859 Disorder of bone density and structure, unspecified: Secondary | ICD-10-CM | POA: Diagnosis not present

## 2020-06-17 ENCOUNTER — Encounter (HOSPITAL_COMMUNITY): Payer: Self-pay

## 2020-06-17 ENCOUNTER — Emergency Department (HOSPITAL_COMMUNITY): Payer: PPO

## 2020-06-17 ENCOUNTER — Telehealth: Payer: Self-pay | Admitting: Cardiology

## 2020-06-17 ENCOUNTER — Emergency Department (HOSPITAL_COMMUNITY)
Admission: EM | Admit: 2020-06-17 | Discharge: 2020-06-17 | Disposition: A | Discharge status: Home / Self Care | Payer: PPO | Attending: Emergency Medicine | Admitting: Emergency Medicine

## 2020-06-17 ENCOUNTER — Other Ambulatory Visit: Payer: Self-pay

## 2020-06-17 DIAGNOSIS — Y939 Activity, unspecified: Secondary | ICD-10-CM | POA: Insufficient documentation

## 2020-06-17 DIAGNOSIS — Z7901 Long term (current) use of anticoagulants: Secondary | ICD-10-CM | POA: Diagnosis not present

## 2020-06-17 DIAGNOSIS — N183 Chronic kidney disease, stage 3 unspecified: Secondary | ICD-10-CM | POA: Diagnosis not present

## 2020-06-17 DIAGNOSIS — W01198A Fall on same level from slipping, tripping and stumbling with subsequent striking against other object, initial encounter: Secondary | ICD-10-CM | POA: Diagnosis not present

## 2020-06-17 DIAGNOSIS — I129 Hypertensive chronic kidney disease with stage 1 through stage 4 chronic kidney disease, or unspecified chronic kidney disease: Secondary | ICD-10-CM | POA: Insufficient documentation

## 2020-06-17 DIAGNOSIS — Z952 Presence of prosthetic heart valve: Secondary | ICD-10-CM

## 2020-06-17 DIAGNOSIS — R062 Wheezing: Secondary | ICD-10-CM | POA: Diagnosis not present

## 2020-06-17 DIAGNOSIS — Z79899 Other long term (current) drug therapy: Secondary | ICD-10-CM | POA: Insufficient documentation

## 2020-06-17 DIAGNOSIS — Y999 Unspecified external cause status: Secondary | ICD-10-CM | POA: Insufficient documentation

## 2020-06-17 DIAGNOSIS — S41102A Unspecified open wound of left upper arm, initial encounter: Secondary | ICD-10-CM | POA: Diagnosis not present

## 2020-06-17 DIAGNOSIS — G20 Parkinson's disease: Secondary | ICD-10-CM | POA: Diagnosis not present

## 2020-06-17 DIAGNOSIS — S0101XA Laceration without foreign body of scalp, initial encounter: Secondary | ICD-10-CM | POA: Diagnosis not present

## 2020-06-17 DIAGNOSIS — Z853 Personal history of malignant neoplasm of breast: Secondary | ICD-10-CM | POA: Diagnosis not present

## 2020-06-17 DIAGNOSIS — S0003XA Contusion of scalp, initial encounter: Secondary | ICD-10-CM | POA: Diagnosis not present

## 2020-06-17 DIAGNOSIS — S0990XA Unspecified injury of head, initial encounter: Secondary | ICD-10-CM | POA: Diagnosis not present

## 2020-06-17 DIAGNOSIS — R05 Cough: Secondary | ICD-10-CM | POA: Diagnosis not present

## 2020-06-17 DIAGNOSIS — Y929 Unspecified place or not applicable: Secondary | ICD-10-CM | POA: Diagnosis not present

## 2020-06-17 DIAGNOSIS — I480 Paroxysmal atrial fibrillation: Secondary | ICD-10-CM

## 2020-06-17 DIAGNOSIS — R0602 Shortness of breath: Secondary | ICD-10-CM

## 2020-06-17 DIAGNOSIS — W19XXXA Unspecified fall, initial encounter: Secondary | ICD-10-CM

## 2020-06-17 DIAGNOSIS — I517 Cardiomegaly: Secondary | ICD-10-CM | POA: Diagnosis not present

## 2020-06-17 LAB — CBC WITH DIFFERENTIAL/PLATELET
Abs Immature Granulocytes: 0.02 10*3/uL (ref 0.00–0.07)
Basophils Absolute: 0 10*3/uL (ref 0.0–0.1)
Basophils Relative: 1 %
Eosinophils Absolute: 0.1 10*3/uL (ref 0.0–0.5)
Eosinophils Relative: 2 %
HCT: 36 % (ref 36.0–46.0)
Hemoglobin: 11.6 g/dL — ABNORMAL LOW (ref 12.0–15.0)
Immature Granulocytes: 0 %
Lymphocytes Relative: 23 %
Lymphs Abs: 1.1 10*3/uL (ref 0.7–4.0)
MCH: 33.3 pg (ref 26.0–34.0)
MCHC: 32.2 g/dL (ref 30.0–36.0)
MCV: 103.4 fL — ABNORMAL HIGH (ref 80.0–100.0)
Monocytes Absolute: 0.4 10*3/uL (ref 0.1–1.0)
Monocytes Relative: 8 %
Neutro Abs: 3.3 10*3/uL (ref 1.7–7.7)
Neutrophils Relative %: 66 %
Platelets: 183 10*3/uL (ref 150–400)
RBC: 3.48 MIL/uL — ABNORMAL LOW (ref 3.87–5.11)
RDW: 14.4 % (ref 11.5–15.5)
WBC: 5 10*3/uL (ref 4.0–10.5)
nRBC: 0 % (ref 0.0–0.2)

## 2020-06-17 LAB — COMPREHENSIVE METABOLIC PANEL
ALT: 36 U/L (ref 0–44)
AST: 53 U/L — ABNORMAL HIGH (ref 15–41)
Albumin: 3.4 g/dL — ABNORMAL LOW (ref 3.5–5.0)
Alkaline Phosphatase: 81 U/L (ref 38–126)
Anion gap: 11 (ref 5–15)
BUN: 20 mg/dL (ref 8–23)
CO2: 26 mmol/L (ref 22–32)
Calcium: 9.4 mg/dL (ref 8.9–10.3)
Chloride: 102 mmol/L (ref 98–111)
Creatinine, Ser: 1.7 mg/dL — ABNORMAL HIGH (ref 0.44–1.00)
GFR calc Af Amer: 32 mL/min — ABNORMAL LOW (ref >60)
GFR calc non Af Amer: 28 mL/min — ABNORMAL LOW (ref >60)
Glucose, Bld: 96 mg/dL (ref 70–99)
Potassium: 4.3 mmol/L (ref 3.5–5.1)
Sodium: 139 mmol/L (ref 135–145)
Total Bilirubin: 0.7 mg/dL (ref 0.3–1.2)
Total Protein: 6.3 g/dL — ABNORMAL LOW (ref 6.5–8.1)

## 2020-06-17 LAB — PROTIME-INR
INR: 2.8 — ABNORMAL HIGH (ref 0.8–1.2)
Prothrombin Time: 28.2 seconds — ABNORMAL HIGH (ref 11.4–15.2)

## 2020-06-17 LAB — BRAIN NATRIURETIC PEPTIDE: B Natriuretic Peptide: 725.1 pg/mL — ABNORMAL HIGH (ref 0.0–100.0)

## 2020-06-17 MED ORDER — FUROSEMIDE 20 MG PO TABS: 20.0000 mg | ORAL_TABLET | Freq: Once | ORAL | Status: DC

## 2020-06-17 MED ORDER — FUROSEMIDE 20 MG PO TABS: 20.0000 mg | ORAL_TABLET | Freq: Every day | ORAL | 0 refills | Status: DC

## 2020-06-17 NOTE — ED Notes (Signed)
Pt's head lac was irrigated with sterile water, she tol this well. Stapler at bedside for EDP.

## 2020-06-17 NOTE — Telephone Encounter (Signed)
Patient's daughter is requesting order for echocardiogram. Please call.

## 2020-06-17 NOTE — Discharge Instructions (Addendum)
As discussed, following today's fall it is more that he follow-up with both your primary care physician and your cardiologist.  In particular, your staples need to be removed in 7 days, and given your wheezing, some concern for fluid retention, it is important that you discuss your cardiac history with your cardiologist. Please take your newly prescribed medication as directed for the next few days, discuss this with your physicians.  If you require medication for a longer period, this can be prescribed by your cardiologist.  Return here for concerning changes in your condition.

## 2020-06-17 NOTE — ED Provider Notes (Signed)
[facility] EMERGENCY DEPARTMENT Provider Note   CSN: [id number] Arrival date & time: 06/17/20  0751     History Chief Complaint  Patient presents with  . Fall on thinners    [name] is a 79 y.o. female.  HPI    Patient presents with her daughter who assists with HPI. Patient has multiple medical issues, including prior mitral valve repair, has Coumadin dependency. She was in her usual state, however, aside from mild cough, wheezing over the past few days, when she had a mechanical fall today. She notes that she fell, when one of the wheels on her walker rolled. She struck her head against a windowsill. She sustained an injury to the top of her head, left upper arm.  No loss of consciousness, no weakness in any extremity, no new confusion, no vomiting. She has no neck pain, has mild soreness in her head, arm, no medication taken for relief. With concern for the patient's use of Coumadin, head trauma, she was brought here for evaluation.  Past Medical History:  Diagnosis Date  . Arthropathy of cervical facet joint    C2-3  . At risk for falls   . BPV (benign positional vertigo)   . Cancer ([place]) 2004    breast-lumpectomy,nodes ,radiation,chemo  . Chronic neck pain   . CKD (chronic kidney disease), stage II   . Diverticulosis of colon   . Elevated troponin    a. 08/2016 in setting of PAF;  b. 08/2016 low risk MV w/ fixed septal defect (LBBB), EF 53%.  Marland Kitchen GERD (gastroesophageal reflux disease)   . Hiatal hernia   . History of aortic valve stenosis   . History of breast cancer per pt no recurrence   dx 2004-- Left breast DCIS (ER/PR+, HER2 negative) s/p  partial masectomy w/ sln bx and  chemoradiotion (completed 2004)  . History of colon polyps   . History of herpes zoster   . History of kidney stones   . HTN (hypertension)   . Hyperlipidemia   . Hypertrophy of inter-atrial septum    a. 09/2014 Echo: ? of atrial mass; b. 10/2014 Cardiac MRI:  no atrial septal mass - polypoid lipomatous hypertrophy of superior atrial septum noted-->Benign.  . Irritable bowel syndrome   . LBBB (left bundle branch block)   . PAF (paroxysmal atrial fibrillation) (HCC)    a. CHA2DS2VASc = 4-->coumadin.  . Parkinsonism [facility])    neurologist-  dr tat  . Personal history of chemotherapy 2004  . Personal history of radiation therapy 2004  . PONV (postoperative nausea and vomiting)   . PVC's (premature ventricular contractions)   . Right ureteral stone   . S/P aortic valve replacement with prosthetic valve    a. 04/13/2006 s/p St Jude mechnical AVR for severe AS;  b. 09/2014 Echo: EF 40-45%, gr1 DD, mild AI/MR, triv TR/PR.  . SUI (stress urinary incontinence, female)   . Symptomatic anemia   . Venous varices      Patient Active Problem List   Diagnosis Date Noted  . PSP (progressive supranuclear palsy) ([place]) 02/29/2020  . Atrial flutter ([place])   . Heme positive stool   . Symptomatic anemia 12/05/2018  . GI bleed 12/05/2018  . CKD (chronic kidney disease), stage III 12/05/2018  . Elevated d-dimer 12/05/2018  . SOB (shortness of breath) 12/05/2018  . Supratherapeutic INR 12/05/2018  . Parkinsonism ([place])   . Elevated troponin 09/19/2016  . Emesis 09/19/2016  . UTI (urinary tract infection)  09/19/2016  . Sinus bradycardia 09/16/2016  . Family history of colon cancer 07/30/2014  . Hiatal hernia 07/30/2014  . Chest pain 10/13/2011  . Disequilibrium 10/13/2011  . Heart valve replaced by other means 04/16/2011  . Chronic anticoagulation 03/17/2011  . S/P AVR (aortic valve replacement)   . PVC's (premature ventricular contractions)   . HTN (hypertension)   . Hypercholesteremia   . Atrial fibrillation with RVR ([place])   . GERD (gastroesophageal reflux disease)   . GERD 02/06/2010  . DIVERTICULOSIS-COLON 02/06/2010  . PERSONAL HX COLONIC POLYPS 02/06/2010  . MASTECTOMY, HX OF 02/06/2010    Past Surgical History:  Procedure Laterality Date  .  ANTERIOR AND POSTERIOR REPAIR  1990   cystocele/ rectocele  . AORTIC VALVE REPLACEMENT  04/13/06   dr gerhardt   and Replacement Ascending Aorta (St. Jude mechanical prosthesis)  . APPENDECTOMY  child 1950  . BREAST LUMPECTOMY Left 01/11/2003   malignant  . BREAST SURGERY Left 2004  . CARDIAC CATHETERIZATION  02-22-2001   dr Martinique   minimal non-obstructive cad/  mild aortic stenosis and aortic root enlargement/  normal LVF, ef 65%  . CARDIAC CATHETERIZATION  03-22-2006    dr Martinique   no significant obstructive cad/  severe aortic stenosis and mild to moderate aortic root enlargement/  upper normal right heart pressures  . CARDIOVASCULAR STRESS TEST  09-30-2011   dr Martinique   lexiscan nuclear study w/ apical thinning  vs  small prior apical infarct w/ no significant ischemia/  hypokinesis of the distal septum and apex, ef 50%  . CARPAL TUNNEL RELEASE Bilateral left 09-09-2004/  right 2007  . CATARACT EXTRACTION W/ INTRAOCULAR LENS  IMPLANT, BILATERAL  2015  . COLONOSCOPY N/A 10/03/2014   Procedure: COLONOSCOPY;  Surgeon: [name], MD;  Location: [facility];  Service: Endoscopy;  Laterality: N/A;  . CYSTOSCOPY WITH RETROGRADE PYELOGRAM, URETEROSCOPY AND STENT PLACEMENT Right 09/17/2016   Procedure: CYSTOSCOPY WITH RIGHT RETROGRADE PYELOGRAM, RIGHT URETEROSCOPY AND STENT PLACEMENT LASER LITHOTRIPSY, RIGHT STENT PLACEMENT;  Surgeon: [name], MD;  Location: [facility];  Service: Urology;  Laterality: Right;  . ESOPHAGEAL MANOMETRY N/A 08/12/2014   Procedure: ESOPHAGEAL MANOMETRY (EM);  Surgeon: [name], MD;  Location: [facility];  Service: Endoscopy;  Laterality: N/A;  . ESOPHAGOGASTRODUODENOSCOPY N/A 10/03/2014   Procedure: ESOPHAGOGASTRODUODENOSCOPY (EGD);  Surgeon: [name], MD;  Location: [facility]COPY;  Service: Endoscopy;  Laterality: N/A;  . ESOPHAGOGASTRODUODENOSCOPY N/A 12/08/2018   Procedure: ESOPHAGOGASTRODUODENOSCOPY (EGD);  Surgeon: Doran Stabler, MD;  Location: [facility];  Service: Gastroenterology;  Laterality: N/A;  . [place]  . FOOT SURGERY Right 2013   hammertoe x2  . HOLMIUM LASER APPLICATION Right 04/11/6062   Procedure: HOLMIUM LASER APPLICATION;  Surgeon: [name], MD;  Location: [facility];  Service: Urology;  Laterality: Right;  . PERCUTANEOUS NEPHROSTOLITHOTOMY  1993  . STONE EXTRACTION WITH BASKET Right 09/17/2016   Procedure: STONE EXTRACTION WITH BASKET;  Surgeon: [name], MD;  [facility];  Service: Urology;  Laterality: Right;  . TOTAL ABDOMINAL HYSTERECTOMY  1971   w/ Bilateral Salpingoophorectomy  . TRANSTHORACIC ECHOCARDIOGRAM  10/24/2014   dr Martinique   hypokinesis of the basal and mid inferoseptal and inferior walls,  ef 40-45%,  grade 1 diastolic dysfunction/  mechanical bileaflet aortic valve noted w/ mild regur., peak grandient 44mHg, valve area 1.35cm^2/  severe MV calcification without stenosis w/ mild  regurg., peak gradient 63mHg/  mild LAE/  poss. atrial mass (MRI showed benign lipomatous hypertrophy of atrial septum) /Frazier ButtPR/mild TR     OB History   No obstetric history on file.     Family History  Problem Relation Age of Onset  . Heart disease Mother   . Heart disease Father 762      CABG  . Other Brother        AVR  . Healthy Child     Social History   Tobacco Use  . Smoking status: Never Smoker  . Smokeless tobacco: Never Used  Vaping Use  . Vaping Use: Never used  Substance Use Topics  . Alcohol use: No    Alcohol/week: 0.0 standard drinks  . Drug use: No    Home Medications Prior to Admission medications   Medication Sig Start Date End Date Taking? Authorizing Provider  acetaminophen (TYLENOL) 500 MG tablet Take 1,000 mg by mouth every 6 (six) hours as needed (head pain).     [provider]  amiodarone (PACERONE) 200 MG tablet Take 1 tablet (200 mg total)  by mouth daily. Please make overdue appt with Dr. TLovena Lebefore anymore refills. 1st attempt 03/14/20   SPatsey Berthold NP  calcium carbonate (TUMS - DOSED IN MG ELEMENTAL CALCIUM) 500 MG chewable tablet Chew 1 tablet by mouth daily as needed for indigestion or heartburn.     [provider]  calcium citrate-vitamin D (CITRACAL+D) 315-200 MG-UNIT per tablet Take 1 tablet by mouth 3 (three) times daily.    [provider]  carbidopa-levodopa (SINEMET IR) 25-100 MG tablet TAKE 2 TABLETS BY MOUTH THREE TIMES DAILY Patient taking differently: Take 2 tablets by mouth 3 (three) times daily.  03/10/20   Tat, REustace Quail DO  Cholecalciferol (VITAMIN D3) 2000 units TABS Take 1 capsule by mouth daily.    [provider]  famotidine-calcium carbonate-magnesium hydroxide (PEPCID COMPLETE) 10-800-165 MG CHEW chewable tablet Chew 1 tablet by mouth daily as needed (heartburn).    [provider]  KLOR-CON M20 20 MEQ tablet Take 20 mEq by mouth daily. 05/26/17   [provider]  KRILL OIL PO Take 1 capsule by mouth daily.     [provider]  loratadine (CLARITIN) 10 MG tablet Take 10 mg by mouth daily.    [provider]  Melatonin 3 MG TABS Take 3 mg by mouth at bedtime as needed (sleep).     [provider]  metoprolol tartrate (LOPRESSOR) 25 MG tablet Take 1 tablet (25 mg total) by mouth daily as needed (if heart rate is above 110). 01/15/19 02/29/20  JMartinique Peter M, MD  Multiple Vitamin (MULTIVITAMIN) tablet Take 1 tablet by mouth daily at 12 noon.     [provider]  Multiple Vitamins-Minerals (VISION-VITE PRESERVE PO) Take 1 tablet by mouth 2 (two) times daily.    [provider]  pantoprazole (PROTONIX) 40 MG tablet Take 40 mg by mouth daily. 06/07/17   [provider]  Probiotic Product (PROBIOTIC-10 PO) Take 1 capsule by mouth daily.     [provider]  simvastatin (ZOCOR) 20 MG tablet Take 20 mg by  mouth every evening.    [provider]  vitamin B-12 (CYANOCOBALAMIN) 1000 MCG tablet Take 1,000 mcg by mouth every morning.    [provider]  warfarin (COUMADIN) 5 MG tablet Take 5 mg by mouth daily. Patient is taking 2.561mon Monday and Friday. Patient is taking 5  mg on Tues.,Wed., Thurs., Sat., and Sun    [provider]    Allergies    Demerol [meperidine], Oxycodone, Adhesive [tape], Ivp dye [iodinated diagnostic agents], and Phenergan [promethazine]  Review of Systems   Review of Systems  Constitutional:       Per HPI, otherwise negative  HENT:       Per HPI, otherwise negative  Respiratory:       Per HPI, otherwise negative  Cardiovascular:       Per HPI, otherwise negative  Gastrointestinal: Negative for vomiting.  Endocrine:       Negative aside from HPI  Genitourinary:       Neg aside from HPI   Musculoskeletal:       Per HPI, otherwise negative  Skin: Positive for wound.  Neurological: Negative for syncope.  Hematological: Bruises/bleeds easily.    Physical Exam Updated Vital Signs There were no vitals taken for this visit.  Physical Exam Vitals and nursing note reviewed.  Constitutional:      Appearance: She is well-developed.     Comments: Frail-appearing elderly female awake and alert  HENT:     Head: Normocephalic and atraumatic.   Eyes:     Conjunctiva/sclera: Conjunctivae normal.  Cardiovascular:     Rate and Rhythm: Normal rate and regular rhythm.  Pulmonary:     Effort: Pulmonary effort is normal.     Breath sounds: Decreased air movement present.     Comments: Cough, no audible wheezing Abdominal:     General: There is no distension.  Musculoskeletal:     Cervical back: Normal range of motion and neck supple. No spinous process tenderness or muscular tenderness.  Skin:    General: Skin is warm and dry.       Neurological:     Mental Status: She is alert and oriented to person, place, and time.     Cranial  Nerves: No cranial nerve deficit.     Motor: Atrophy present. No tremor or abnormal muscle tone.     Coordination: Coordination normal.     ED Results / Procedures / Treatments   Labs (all labs ordered are listed, but only abnormal results are displayed) Labs Reviewed  COMPREHENSIVE METABOLIC PANEL - Abnormal; Notable for the following components:      Result Value   Creatinine, Ser 1.70 (*)    Total Protein 6.3 (*)    Albumin 3.4 (*)    AST 53 (*)    GFR calc non Af Amer 28 (*)    GFR calc Af Amer 32 (*)    All other components within normal limits  CBC WITH DIFFERENTIAL/PLATELET - Abnormal; Notable for the following components:   RBC 3.48 (*)    Hemoglobin 11.6 (*)    MCV 103.4 (*)    All other components within normal limits  PROTIME-INR - Abnormal; Notable for the following components:   Prothrombin Time 28.2 (*)    INR 2.8 (*)    All other components within normal limits  BRAIN NATRIURETIC PEPTIDE    EKG None  Radiology CT Head Wo Contrast  Result Date: 06/17/2020 CLINICAL DATA:  Fall.  On anticoagulation. EXAM: CT HEAD WITHOUT CONTRAST TECHNIQUE: Contiguous axial images were obtained from the base of the skull through the vertex without intravenous contrast. COMPARISON:  03/24/2020 FINDINGS: Brain: No evidence of acute infarction, hemorrhage, hydrocephalus, extra-axial collection or mass lesion/mass effect. Prominence of the sulci and ventricles compatible with age related brain atrophy. There is mild diffuse  low-attenuation within the subcortical and periventricular white matter compatible with chronic microvascular disease. Vascular: No hyperdense vessel or unexpected calcification. Skull: Normal. Negative for fracture or focal lesion. Sinuses/Orbits: There is partial opacification of the ethmoid air cells, bilateral maxillary sinus, sphenoid sinus. Mastoid air cells are clear. Other: Right frontoparietal scalp hematoma is identified measuring 1.4 cm in thickness.  IMPRESSION: 1. No acute intracranial abnormalities. 2. Chronic small vessel ischemic disease and brain atrophy. 3. Right frontoparietal scalp hematoma. 4. Bilateral maxillary sinus, sphenoid sinus, and ethmoid opacification. Electronically Signed   By: Kerby Moors M.D.   On: 06/17/2020 09:49   DG Chest Port 1 View  Result Date: 06/17/2020 CLINICAL DATA:  Wheezing.  Cough. EXAM: PORTABLE CHEST 1 VIEW COMPARISON:  03/24/2020. FINDINGS: Prior cardiac valve replacement. Cardiomegaly. No pulmonary venous congestion. Low lung volumes. Left base infiltrate cannot be excluded. No prominent pleural effusion. No pneumothorax. Soft tissue air noted over the right chest. This could be from subcutaneous emphysema, infection, or secondary to a laceration. No acute bony abnormality identified. Left upper extremity peripheral vascular calcification. IMPRESSION: 1. Prior cardiac valve replacement. Cardiomegaly. No pulmonary venous congestion. 2.  Low lung volumes.  Left base infiltrate cannot be excluded. 3. Soft tissue air noted over the right chest wall. This could be from subcutaneous emphysema, infection, or secondary to a laceration. Clinical correlation suggested. Electronically Signed   By: Marcello Moores  Register   On: 06/17/2020 09:09    Procedures Procedures (including critical care time)  LACERATION REPAIR Performed by: Carmin Muskrat Authorized by: Carmin Muskrat Consent: Verbal consent obtained. Risks and benefits: risks, benefits and alternatives were discussed Consent given by: patient Patient identity confirmed: provided demographic data Prepped and Draped in normal sterile fashion Wound explored  Laceration Location: scalp  Laceration Length: 5cm - v-shaped  No Foreign Bodies seen or palpated  Irrigation method: syringe Amount of cleaning: standard  Skin closure: staples  Number of sutures: 4  Technique: close - though the R edge was partially avulsed / lacerated and was only closely  approximated  Patient tolerance: Patient tolerated the procedure well with no immediate complications.   Medications Ordered in ED Medications  furosemide (LASIX) tablet 20 mg (has no administration in time range)    ED Course  I have reviewed the triage vital signs and the nursing notes.  Pertinent labs & imaging results that were available during my care of the patient were reviewed by me and considered in my medical decision making (see chart for details).   Elderly female presents after mechanical fall with head trauma. Patient's use of anticoagulants is concerning, patient requires head CT, labs for evaluation of possible intracranial hemorrhage, fracture.  Initial neuro exam somewhat reassuring. In addition, patient found to have wheezing, cough, given her history of valve repair, labs sent for evaluation of heart failure, infection.  X-ray pending as well.  10:18 AM Head CT reassuring, labs initially notable for INR 1.8.  Chest x-ray reviewed, area of possible subcutaneous emphysema with no notable correlation on physical exam, repeat questioning, patient denies any chest pain, discomfort in that area.  10:34 AM Patient awake, alert, has tolerated staple repair well. We discussed all findings including reassuring INR value, hemoglobin value. No evidence for intracranial hemorrhage, fracture. We discussed the patient's elevated BNP, and her echocardiogram from 2 years ago, notable for mild LV dysfunction, but reassuring aortic valve functionality. Possibilities for further evaluation in regards to her wheezing, elevated BNP and are initiation of Lasix, with close outpatient cardiology  follow-up versus staying in the ED for cardiology consult. Patient has strong preference for the former, given the absence of new oxygen requirement this is reasonable.  She and her daughter both understand importance of following with cardiology, and return precautions given her presentation for  fall. However, given otherwise reassuring findings, absence of intracranial hemorrhage, fracture, pneumonia, no oxygen requirement, now with hemostasis achieved with stapling, the patient is appropriate for discharge with close outpatient follow-up.  MDM Rules/Calculators/A&P MDM Number of Diagnoses or Management Options Falls, initial encounter: new, needed workup Traumatic injury of head, initial encounter: new, needed workup Wheezing: new, needed workup   Amount and/or Complexity of Data Reviewed Clinical lab tests: reviewed Tests in the radiology section of CPT: reviewed Tests in the medicine section of CPT: reviewed Decide to obtain previous medical records or to obtain history from someone other than the patient: yes Obtain history from someone other than the patient: yes Review and summarize past medical records: yes Independent visualization of images, tracings, or specimens: yes  Risk of Complications, Morbidity, and/or Mortality Presenting problems: high Diagnostic procedures: high Management options: high  Critical Care Total time providing critical care: < 30 minutes  Patient Progress Patient progress: improved  Final Clinical Impression(s) / ED Diagnoses Final diagnoses:  Falls, initial encounter  Traumatic injury of head, initial encounter  Wheezing    Rx / DC Orders ED Discharge Orders         Ordered    furosemide (LASIX) 20 MG tablet  Daily     Discontinue  Reprint     06/17/20 1039           Carmin Muskrat, MD 06/17/20 1039

## 2020-06-17 NOTE — ED Notes (Signed)
Left arm skin tear was cleaned with NS, covered with xeroform, padded c non-adhesive gauze & wrapped with Kerlix. Pt tol wound care well.

## 2020-06-17 NOTE — ED Notes (Signed)
The 4 staples to head lac was covered with a bonnet style wrap for pressure support/protection.

## 2020-06-17 NOTE — ED Triage Notes (Signed)
Pt comes in by POV with daughter & when she was getting ready for her Dr appointment. Pt reports that she fell & hit her Left arm during the fall & hit her head on the window seal, she has a head lac to the top/crown of her scalp. Pt denies having took any of her blood thinners so far today, & denies LOC. Upon arrival to ED the bleeding has stopped. Pt A/Ox4, verbal- able to make needs known.

## 2020-06-17 NOTE — Telephone Encounter (Signed)
Attempted to call daughter. No answer, no way to leave VM.   There is no active order for echo in system  Routed to MD/LPN to advise if one is due

## 2020-06-18 NOTE — Telephone Encounter (Signed)
No Echo planned at this time  Ryszard Socarras Martinique MD, [facility]

## 2020-06-18 NOTE — Telephone Encounter (Signed)
Spoke to patient's daughter Neoma Laming.She stated mother went to [facility] ED yesterday due to a fall.Cxr was done due to wheezing and a cough.Cxr was normal.ED Dr.advised she does have fluid in her lungs he advised to have a echo.She stated mother did not want to have done at hospital.She wanted Dr.Jordan to order.Advised Dr.Jordan is out of office this week.I will send message to him for a order.

## 2020-06-19 ENCOUNTER — Telehealth: Payer: Self-pay | Admitting: *Deleted

## 2020-06-19 NOTE — Telephone Encounter (Signed)
Returned call to patient's daughter Neoma Laming left Dr.Jordan's advice on personal voice mail.Advised scheduler will call back with echo appointment.

## 2020-06-19 NOTE — Telephone Encounter (Signed)
OK to order Echo for dyspnea  Dontavius Keim Martinique MD, [facility]

## 2020-06-19 NOTE — Telephone Encounter (Signed)
Spoke with [name] regarding appointment for Echo scheduled Friday 06/20/20 at 1:00pm--arrival time is 12:30 pm "entrance C' at [facility].  [name] voiced her understanding.

## 2020-06-20 ENCOUNTER — Ambulatory Visit (HOSPITAL_COMMUNITY)
Admission: RE | Admit: 2020-06-20 | Discharge: 2020-06-20 | Disposition: A | Discharge status: Home / Self Care | Payer: PPO | Source: Ambulatory Visit | Attending: Cardiology | Admitting: Cardiology

## 2020-06-20 ENCOUNTER — Other Ambulatory Visit: Payer: Self-pay

## 2020-06-20 DIAGNOSIS — R0602 Shortness of breath: Secondary | ICD-10-CM | POA: Diagnosis not present

## 2020-06-20 DIAGNOSIS — E785 Hyperlipidemia, unspecified: Secondary | ICD-10-CM | POA: Diagnosis not present

## 2020-06-20 DIAGNOSIS — I119 Hypertensive heart disease without heart failure: Secondary | ICD-10-CM | POA: Diagnosis not present

## 2020-06-20 DIAGNOSIS — I480 Paroxysmal atrial fibrillation: Secondary | ICD-10-CM | POA: Diagnosis not present

## 2020-06-20 DIAGNOSIS — I083 Combined rheumatic disorders of mitral, aortic and tricuspid valves: Secondary | ICD-10-CM | POA: Insufficient documentation

## 2020-06-20 DIAGNOSIS — Z952 Presence of prosthetic heart valve: Secondary | ICD-10-CM | POA: Insufficient documentation

## 2020-06-20 DIAGNOSIS — R82998 Other abnormal findings in urine: Secondary | ICD-10-CM | POA: Diagnosis not present

## 2020-06-20 NOTE — Progress Notes (Signed)
  Echocardiogram 2D Echocardiogram has been performed.  Jennette Dubin 06/20/2020, 1:59 PM

## 2020-06-23 ENCOUNTER — Encounter: Payer: Self-pay | Admitting: Cardiology

## 2020-06-23 ENCOUNTER — Ambulatory Visit (INDEPENDENT_AMBULATORY_CARE_PROVIDER_SITE_OTHER): Payer: PPO | Admitting: Cardiology

## 2020-06-23 ENCOUNTER — Other Ambulatory Visit: Payer: Self-pay

## 2020-06-23 VITALS — BP 132/52 | HR 59 | Ht 59.0 in | Wt 126.0 lb

## 2020-06-23 DIAGNOSIS — I10 Essential (primary) hypertension: Secondary | ICD-10-CM

## 2020-06-23 DIAGNOSIS — I480 Paroxysmal atrial fibrillation: Secondary | ICD-10-CM | POA: Diagnosis not present

## 2020-06-23 DIAGNOSIS — I5043 Acute on chronic combined systolic (congestive) and diastolic (congestive) heart failure: Secondary | ICD-10-CM

## 2020-06-23 DIAGNOSIS — Z952 Presence of prosthetic heart valve: Secondary | ICD-10-CM

## 2020-06-23 MED ORDER — FUROSEMIDE 20 MG PO TABS: 20.0000 mg | ORAL_TABLET | Freq: Every day | ORAL | 3 refills | Status: DC

## 2020-06-23 NOTE — Progress Notes (Signed)
Cardiology Office Note   Date:  06/23/2020   ID:  Christy, [name] 30-Nov-1941, MRN [id number]  PCP:  Christy Battles, MD  Cardiologist:   Christy Martinique, MD   Chief Complaint  Patient presents with  . Follow-up      History of Present Illness: [name] is a 79 y.o. female seen for evaluation of CHF. She has a  h/o severe AS s/p SJM mech AVR in 2007. She also has a h/o PAF and is chronically anticoagulated with coumadin. She has a history of bradycardia on beta blocker.  September 2017 following a urologic procedure she developed abd pain, nausea, c/p, sob, and palpitations, prompting her to call EMS. She was found to be in AF with RVR, which later converted to sinus rhythm. She was admitted to [facility] and had mild troponin elevation. She underwent stress testing, which was low risk. She was seen in October 2017by [name] who felt her Afib episodes were infrequent and recommended following for now. If episodes became more frequent then consider resumption of beta blocker or AAD without rate slowing effects such as Tikosyn.   She does have a history of atypical Parikinsons and progressive supranuclear palsy followed by Neurology.   Admitted 12/10-12/14/2019 for Afib RVR, chest pain, anemia (Hgb 5.6) following a fall/trauma, extensive hematoma left chest. She became volume overloaded post transfusion>>BiPAP>>diuresed, low-dose amio started for elevated HR.  Seen by [name] in March 2020 and appeared to be maintaining NSR on amiodarone. She has multiple falls.  She was seen in the ED on 09/09/19 with mechanical fall and facial laceration. CT negative for bleed. INR therapeutic. She denies any significant palpitations. No syncope. No chest pain or dyspnea.   When seen in [facility] in March 2021 only noted 2 episodes of Afib lasting minutes. Seen in ED on March 29 following a fall. CT and Xrays negative.   She was seen recently in the ED on 06/19/20 after  a mechanical fall and scalp laceration. Head CT showed no bleeding. She complained of increased wheezing and cough. CXR showed CM with low lung volumes. No clear edema. BNP was elevated at 725. She was DC on lasix 20 mg daily and outpatient Echo ordered. This demonstrated significant decreased in EF to 30-35% down from 40-45% with global HK. AV prosthesis was functioning normally. Severe LAE.   On follow up today she reports that her wheezing and cough have improved with the lasix. Also had some [name] edema that is better. No chest pain. No dizziness or syncope. Just very poor balance.     Past Medical History:  Diagnosis Date  . Arthropathy of cervical facet joint    C2-3  . At risk for falls   . BPV (benign positional vertigo)   . Cancer ([place]) 2004    breast-lumpectomy,nodes ,radiation,chemo  . Chronic neck pain   . CKD (chronic kidney disease), stage II   . Diverticulosis of colon   . Elevated troponin    a. 08/2016 in setting of PAF;  b. 08/2016 low risk MV w/ fixed septal defect (LBBB), EF 53%.  Marland Kitchen GERD (gastroesophageal reflux disease)   . Hiatal hernia   . History of aortic valve stenosis   . History of breast cancer per pt no recurrence   dx 2004-- Left breast DCIS (ER/PR+, HER2 negative) s/p  partial masectomy w/ sln bx and  chemoradiotion (completed 2004)  . History of colon polyps   . History of herpes zoster   .  History of kidney stones   . HTN (hypertension)   . Hyperlipidemia   . Hypertrophy of inter-atrial septum    a. 09/2014 Echo: ? of atrial mass; b. 10/2014 Cardiac MRI: no atrial septal mass - polypoid lipomatous hypertrophy of superior atrial septum noted-->Benign.  . Irritable bowel syndrome   . LBBB (left bundle branch block)   . PAF (paroxysmal atrial fibrillation) (HCC)    a. CHA2DS2VASc = 4-->coumadin.  . Parkinsonism [facility])    neurologist-  [name]  . Personal history of chemotherapy 2004  . Personal history of radiation therapy 2004  . PONV (postoperative  nausea and vomiting)   . PVC's (premature ventricular contractions)   . Right ureteral stone   . S/P aortic valve replacement with prosthetic valve    a. 04/13/2006 s/p St Jude mechnical AVR for severe AS;  b. 09/2014 Echo: EF 40-45%, gr1 DD, mild AI/MR, triv TR/PR.  . SUI (stress urinary incontinence, female)   . Symptomatic anemia   . Venous varices      Past Surgical History:  Procedure Laterality Date  . ANTERIOR AND POSTERIOR REPAIR  1990   cystocele/ rectocele  . AORTIC VALVE REPLACEMENT  04/13/06   Christy [name]   and Replacement Ascending Aorta (St. Jude mechanical prosthesis)  . APPENDECTOMY  child 1950  . BREAST LUMPECTOMY Left 01/11/2003   malignant  . BREAST SURGERY Left 2004  . CARDIAC CATHETERIZATION  02-22-2001   [name]   minimal non-obstructive cad/  mild aortic stenosis and aortic root enlargement/  normal LVF, ef 65%  . CARDIAC CATHETERIZATION  03-22-2006    [name]   no significant obstructive cad/  severe aortic stenosis and mild to moderate aortic root enlargement/  upper normal right heart pressures  . CARDIOVASCULAR STRESS TEST  09-30-2011   [name]   lexiscan nuclear study w/ apical thinning  vs  small prior apical infarct w/ no significant ischemia/  hypokinesis of the distal septum and apex, ef 50%  . CARPAL TUNNEL RELEASE Bilateral left 09-09-2004/  right 2007  . CATARACT EXTRACTION W/ INTRAOCULAR LENS  IMPLANT, BILATERAL  2015  . COLONOSCOPY N/A 10/03/2014   Procedure: COLONOSCOPY;  Surgeon: Christy Mayer, MD;  Location: [place];  Service: Endoscopy;  Laterality: N/A;  . CYSTOSCOPY WITH RETROGRADE PYELOGRAM, URETEROSCOPY AND STENT PLACEMENT Right 09/17/2016   Procedure: CYSTOSCOPY WITH RIGHT RETROGRADE PYELOGRAM, RIGHT URETEROSCOPY AND STENT PLACEMENT LASER LITHOTRIPSY, RIGHT STENT PLACEMENT;  Surgeon: Christy Hughs, MD;  Location: [facility];  Service: Urology;  Laterality: Right;  . ESOPHAGEAL MANOMETRY N/A 08/12/2014    Procedure: ESOPHAGEAL MANOMETRY (EM);  Surgeon: Christy Mayer, MD;  Location: [facility];  Service: Endoscopy;  Laterality: N/A;  . ESOPHAGOGASTRODUODENOSCOPY N/A 10/03/2014   Procedure: ESOPHAGOGASTRODUODENOSCOPY (EGD);  Surgeon: Christy Mayer, MD;  Location: [facility];  Service: Endoscopy;  Laterality: N/A;  . ESOPHAGOGASTRODUODENOSCOPY N/A 12/08/2018   Procedure: ESOPHAGOGASTRODUODENOSCOPY (EGD);  Surgeon: Doran Stabler, MD;  Location: [place];  Service: Gastroenterology;  Laterality: N/A;  . [place]  . FOOT SURGERY Right 2013   hammertoe x2  . HOLMIUM LASER APPLICATION Right 0/35/5974   Procedure: HOLMIUM LASER APPLICATION;  Surgeon: Christy Hughs, MD;  Location: [facility];  Service: Urology;  Laterality: Right;  . PERCUTANEOUS NEPHROSTOLITHOTOMY  1993  . STONE EXTRACTION WITH BASKET Right 09/17/2016   Procedure: STONE EXTRACTION WITH BASKET;  Surgeon: Christy Hughs, MD;  Location: [facility];  Service: Urology;  Laterality: Right;  . TOTAL ABDOMINAL HYSTERECTOMY  1971   w/ Bilateral Salpingoophorectomy  . TRANSTHORACIC ECHOCARDIOGRAM  10/24/2014   [name]   hypokinesis of the basal and mid inferoseptal and inferior walls,  ef 40-45%,  grade 1 diastolic dysfunction/  mechanical bileaflet aortic valve noted w/ mild regur., peak grandient 21mHg, valve area 1.35cm^2/  severe MV calcification without stenosis w/ mild regurg., peak gradient 370mg/  mild LAE/  poss. atrial mass (MRI showed benign lipomatous hypertrophy of atrial septum) /tFrazier ButtR/mild TR     Current Outpatient Medications  Medication Sig Dispense Refill  . acetaminophen (TYLENOL) 500 MG tablet Take 1,000 mg by mouth every 6 (six) hours as needed (head pain).     . Marland Kitchenmiodarone (PACERONE) 200 MG tablet Take 1 tablet (200 mg total) by mouth daily. Please make overdue appt with Christy. TaLovena Leefore anymore refills. 1st attempt 90 tablet 4    . calcium carbonate (TUMS - DOSED IN MG ELEMENTAL CALCIUM) 500 MG chewable tablet Chew 1 tablet by mouth daily as needed for indigestion or heartburn.     . calcium citrate-vitamin D (CITRACAL+D) 315-200 MG-UNIT per tablet Take 1 tablet by mouth 3 (three) times daily.    . carbidopa-levodopa (SINEMET IR) 25-100 MG tablet TAKE 2 TABLETS BY MOUTH THREE TIMES DAILY (Patient taking differently: Take 2 tablets by mouth 3 (three) times daily. Breakfast, lunch, dinner) 540 tablet 2  . Cholecalciferol (VITAMIN D3) 2000 units TABS Take 2,000 Units by mouth daily.     . famotidine-calcium carbonate-magnesium hydroxide (PEPCID COMPLETE) 10-800-165 MG CHEW chewable tablet Chew 1 tablet by mouth daily as needed (heartburn).    . Marland KitchenLOR-CON M20 20 MEQ tablet Take 20 mEq by mouth daily.    . Marland Kitchenoratadine (CLARITIN) 10 MG tablet Take 10 mg by mouth daily.    . Melatonin 3 MG TABS Take 3 mg by mouth at bedtime as needed (sleep).     . Multiple Vitamin (MULTIVITAMIN) tablet Take 1 tablet by mouth daily at 12 noon.     . pantoprazole (PROTONIX) 40 MG tablet Take 40 mg by mouth daily.    . Probiotic Product (PROBIOTIC-10 PO) Take 1 capsule by mouth daily.     . simvastatin (ZOCOR) 20 MG tablet Take 20 mg by mouth every evening.    . vitamin B-12 (CYANOCOBALAMIN) 1000 MCG tablet Take 1,000 mcg by mouth every morning.    . warfarin (COUMADIN) 5 MG tablet Take 5 mg by mouth daily. Patient is taking 2.33m42mn Sunday, Tues, ThuLa Grandeat. Patient is taking 5 mg on Monday, Wed, Friday.    . metoprolol tartrate (LOPRESSOR) 25 MG tablet Take 1 tablet (25 mg total) by mouth daily as needed (if heart rate is above 110). 90 tablet 3   No current facility-administered medications for this visit.    Allergies:   Demerol [meperidine], Oxycodone, Adhesive [tape], Ivp dye [iodinated diagnostic agents], and Phenergan [promethazine]    Social History:  The patient  reports that she has never smoked. She has never used smokeless tobacco.  She reports that she does not drink alcohol and does not use drugs.   Family History:  The patient's family history includes Healthy in her child; Heart disease in her mother; Heart disease (age of onset: 76)48n her father; Other in her brother.    ROS:  Please see the history of present illness.   Otherwise, review of systems are positive for none.   All other  systems are reviewed and negative.    PHYSICAL EXAM: VS:  BP (!) 132/52 (BP Location: Right Arm, Patient Position: Sitting, Cuff Size: Normal)   Pulse (!) 59   Ht 4' 11" (1.499 m)   Wt 126 lb (57.2 kg)   SpO2 95%   BMI 25.45 kg/m  , BMI Body mass index is 25.45 kg/m. GEN: Well nourished, well developed, in no acute distress  HEENT: normal  Neck: no JVD, carotid bruits, or masses Cardiac: RRR; good mechanical AV click. SEM RUSB Respiratory:  clear to auscultation bilaterally, normal work of breathing GI: soft, nontender, nondistended, + BS MS: no deformity or atrophy  Skin: warm and dry, no rash Neuro:  Strength and sensation are intact Psych: euthymic mood, full affect   EKG:  EKG is not ordered today. The ekg ordered today demonstrates N/A   Recent Labs: 04/01/2020: TSH 4.370 06/17/2020: ALT 36; B Natriuretic Peptide 725.1; BUN 20; Creatinine, Ser 1.70; Hemoglobin 11.6; Platelets 183; Potassium 4.3; Sodium 139    Lipid Panel    Component Value Date/Time   CHOL 182 04/01/2020 0849   TRIG 92 04/01/2020 0849   HDL 89 04/01/2020 0849   CHOLHDL 2.0 04/01/2020 0849   CHOLHDL 1.9 09/30/2011 0415   VLDL 8 09/30/2011 0415   LDLCALC 77 04/01/2020 0849    Dated 06/12/19: cholesterol 147, triglycerides 74, HDL 64, LDL 68.   Wt Readings from Last 3 Encounters:  06/23/20 126 lb (57.2 kg)  04/01/20 127 lb (57.6 kg)  03/24/20 124 lb (56.2 kg)      Other studies Reviewed: Additional studies/ records that were reviewed today include:   11/17/18: TTE Study Conclusions - Left ventricle: The cavity size was normal. Wall  thickness was normal. Systolic function was mildly reduced. The estimated ejection fraction was in the range of 45% to 50%. Features are consistent with a pseudonormal left ventricular filling pattern, with concomitant abnormal relaxation and increased filling pressure (grade 2 diastolic dysfunction). - Aortic valve: A mechanical prosthesis was present. There was mild regurgitation. Valve area (VTI): 1.43 cm^2. Valve area (Vmax): 1.28 cm^2. Valve area (Vmean): 1.2 cm^2. - Mitral valve: Mildly to moderately calcified annulus. Valve area by continuity equation (using LVOT flow): 1.55 cm^2. - Left atrium: The atrium was mildly dilated.   09/21/18 stress myoview IMPRESSION: 1. No evidence reversible ischemia. Fixed defect in the septal wall with differential including infarction versus artifact from LEFT bundle branch block. 2. Septal dyskinesia in patient with LEFT bundle branch block. 3. Left ventricular ejection fraction 53% 4. Non invasive risk stratification*: Low  Echo 06/20/20: IMPRESSIONS    1. Moderate global reduction in LV systolic function; s/p AVR with mild  AI and mean gradient 12 mmHg; biatrial enlargement; mild TR.  2. Left ventricular ejection fraction, by estimation, is 30 to 35%. The  left ventricle has moderately decreased function. The left ventricle  demonstrates global hypokinesis. Left ventricular diastolic parameters are  consistent with Grade II diastolic  dysfunction (pseudonormalization).  3. Right ventricular systolic function is normal. The right ventricular  size is normal. Tricuspid regurgitation signal is inadequate for assessing  PA pressure.  4. Left atrial size was severely dilated.  5. Right atrial size was mildly dilated.  6. The mitral valve is normal in structure. Trivial mitral valve  regurgitation. No evidence of mitral stenosis.  7. The aortic valve has been repaired/replaced. Aortic valve  regurgitation is mild.  No aortic stenosis is present. There is a unknown  St. Jude mechanical  valve present in the aortic position. Procedure Date:  04/13/2006.    ASSESSMENT AND PLAN:  1. Acute on chronic combined systolic/diastolic CHF. EF down to 30-35%. On Metoprolol. Symptoms improved with lasix. Will continue lasix 20 mg daily. I would like to get her started on Entresto. Will initiate at 24/26 mg bid. Check BMET in one week. Will have her follow up with Pharm D in 2 weeks to see if tolerating and whether we can titrate further. Sodium restriction.   2.  PAF/Tachy-brady syndrome: Pt is now on amiodarone and appears to be maintaining NSR well.   Previously she had history of severe brady on beta blockers. This will limit titration of beta blocker.   2. S/P SJM AVR: Stable on echo in September 2019.  continueChronic coumadin. SBE prophylaxis. Good valve click on exam. Having multiple falls but with mechanical valve really can't stop Coumadin  3. Essential HTN: Controlled  4. CHY:IFOYDXAJOINOMV. Cont statin.   5. CKD    Current medicines are reviewed at length with the patient today.  The patient does not have concerns regarding medicines.  The following changes have been made:  no change  Labs/ tests ordered today include:   No orders of the defined types were placed in this encounter.    Disposition:   FU with me in 2 months  Signed, Christy Martinique, MD  06/23/2020 1:58 PM    [facility] [address] [place], [place], [address] Phone [phone], Fax [phone]

## 2020-06-23 NOTE — Patient Instructions (Addendum)
Medication Instructions:  Continue Lasix 20 mg daily Start Entresto 24/26 mg twice a day Continue all other medications *If you need a refill on your cardiac medications before your next appointment, please call your pharmacy*   Lab Work: Bmet in 1 week Lab order enclosed    Testing/Procedures: None ordered   Follow-Up: At [facility], you and your health needs are our priority.  As part of our continuing mission to provide you with exceptional heart care, we have created designated Provider Care Teams.  These Care Teams include your primary Cardiologist (physician) and Advanced Practice Providers (APPs -  Physician Assistants and Nurse Practitioners) who all work together to provide you with the care you need, when you need it.  We recommend signing up for the patient portal called "MyChart".  Sign up information is provided on this After Visit Summary.  MyChart is used to connect with patients for Virtual Visits (Telemedicine).  Patients are able to view lab/test results, encounter notes, upcoming appointments, etc.  Non-urgent messages can be sent to your provider as well.   To learn more about what you can do with MyChart, go to [email or web address].    Your next appointment:  Monday 8/16 at 3:00 pm   The format for your next appointment: Office     Provider:  Dr.Jordan     Schedule appointment with pharmacy in 2 weeks

## 2020-06-24 DIAGNOSIS — Z1339 Encounter for screening examination for other mental health and behavioral disorders: Secondary | ICD-10-CM | POA: Diagnosis not present

## 2020-06-24 DIAGNOSIS — Z7901 Long term (current) use of anticoagulants: Secondary | ICD-10-CM | POA: Diagnosis not present

## 2020-06-24 DIAGNOSIS — Z1331 Encounter for screening for depression: Secondary | ICD-10-CM | POA: Diagnosis not present

## 2020-06-24 DIAGNOSIS — I10 Essential (primary) hypertension: Secondary | ICD-10-CM | POA: Diagnosis not present

## 2020-06-24 DIAGNOSIS — I480 Paroxysmal atrial fibrillation: Secondary | ICD-10-CM | POA: Diagnosis not present

## 2020-06-24 DIAGNOSIS — I501 Left ventricular failure: Secondary | ICD-10-CM | POA: Diagnosis not present

## 2020-06-24 DIAGNOSIS — Q849 Congenital malformation of integument, unspecified: Secondary | ICD-10-CM | POA: Diagnosis not present

## 2020-06-24 DIAGNOSIS — G231 Progressive supranuclear ophthalmoplegia [Steele-Richardson-Olszewski]: Secondary | ICD-10-CM | POA: Diagnosis not present

## 2020-06-24 DIAGNOSIS — Z952 Presence of prosthetic heart valve: Secondary | ICD-10-CM | POA: Diagnosis not present

## 2020-06-24 DIAGNOSIS — M8580 Other specified disorders of bone density and structure, unspecified site: Secondary | ICD-10-CM | POA: Diagnosis not present

## 2020-06-24 DIAGNOSIS — R269 Unspecified abnormalities of gait and mobility: Secondary | ICD-10-CM | POA: Diagnosis not present

## 2020-06-24 DIAGNOSIS — E785 Hyperlipidemia, unspecified: Secondary | ICD-10-CM | POA: Diagnosis not present

## 2020-06-24 DIAGNOSIS — Z0000 Encounter for general adult medical examination without abnormal findings: Secondary | ICD-10-CM | POA: Diagnosis not present

## 2020-06-27 DIAGNOSIS — Z1212 Encounter for screening for malignant neoplasm of rectum: Secondary | ICD-10-CM | POA: Diagnosis not present

## 2020-07-01 ENCOUNTER — Telehealth: Payer: Self-pay | Admitting: Cardiology

## 2020-07-01 ENCOUNTER — Other Ambulatory Visit: Payer: Self-pay

## 2020-07-01 DIAGNOSIS — I10 Essential (primary) hypertension: Secondary | ICD-10-CM

## 2020-07-01 DIAGNOSIS — I5043 Acute on chronic combined systolic (congestive) and diastolic (congestive) heart failure: Secondary | ICD-10-CM

## 2020-07-01 DIAGNOSIS — I480 Paroxysmal atrial fibrillation: Secondary | ICD-10-CM

## 2020-07-01 DIAGNOSIS — Z952 Presence of prosthetic heart valve: Secondary | ICD-10-CM

## 2020-07-01 LAB — BASIC METABOLIC PANEL
BUN/Creatinine Ratio: 19 (ref 12–28)
BUN: 32 mg/dL — ABNORMAL HIGH (ref 8–27)
CO2: 27 mmol/L (ref 20–29)
Calcium: 9.8 mg/dL (ref 8.7–10.3)
Chloride: 101 mmol/L (ref 96–106)
Creatinine, Ser: 1.7 mg/dL — ABNORMAL HIGH (ref 0.57–1.00)
GFR calc Af Amer: 32 mL/min/{1.73_m2} — ABNORMAL LOW (ref >59)
GFR calc non Af Amer: 28 mL/min/{1.73_m2} — ABNORMAL LOW (ref >59)
Glucose: 90 mg/dL (ref 65–99)
Potassium: 4 mmol/L (ref 3.5–5.2)
Sodium: 141 mmol/L (ref 134–144)

## 2020-07-01 NOTE — Telephone Encounter (Signed)
Patient should continue Entresto until told otherwise. She has appointment with pharmD on 7/12. Does not have enough medication until then. Will give 1 bottle of samples. Will leave at [address] front desk.

## 2020-07-01 NOTE — Telephone Encounter (Signed)
Patient cam in today for lab work as directed by Dr. Martinique. Patient started [facility] 06/23/20 and was told to take her last does today 07/01/20----Does she still need to stop this medication until she gets her lab results or should she continue the medication.  Please advise

## 2020-07-01 NOTE — Telephone Encounter (Signed)
Noted.  Patient is aware to get samples at [address] office.

## 2020-07-01 NOTE — Telephone Encounter (Signed)
Spoke to daughter Neoma Laming.  Daughter states they were told to to not take any more medication after having blood work ( BMP) today.  RN informed daughter  Patient is to continue with the Delene Loll  she is taking now - 24/26 twice a day .    Per last  Office note , she will have an appointment with   [facility] PHARMACIST at [facility]  On 07/07/20.   Daughter states patient only has enough medication  Until 07/06/20 the day before appointment on 07/07/20.  Spoke to pharmacist , patient will need samples until appointment.RN called [address] -  Sample available for pick up     Patient's daughter will pick up a bottle 24/26mg   from [address] street office.

## 2020-07-04 ENCOUNTER — Other Ambulatory Visit (HOSPITAL_COMMUNITY): Payer: PPO

## 2020-07-07 ENCOUNTER — Other Ambulatory Visit: Payer: Self-pay

## 2020-07-07 ENCOUNTER — Ambulatory Visit (INDEPENDENT_AMBULATORY_CARE_PROVIDER_SITE_OTHER): Payer: PPO | Admitting: Pharmacist

## 2020-07-07 ENCOUNTER — Telehealth: Payer: Self-pay | Admitting: Pharmacist

## 2020-07-07 DIAGNOSIS — I5042 Chronic combined systolic (congestive) and diastolic (congestive) heart failure: Secondary | ICD-10-CM

## 2020-07-07 MED ORDER — ENTRESTO 24-26 MG PO TABS: 1.0000 | ORAL_TABLET | Freq: Two times a day (BID) | ORAL | 3 refills | Status: DC

## 2020-07-07 NOTE — Telephone Encounter (Signed)
Called and spoke with patient. Advised her that per Dr. Martinique, continue Entresto 24/26mg  BID and keep close eye on BP. Follow up with him in 1 month for reassessment.

## 2020-07-07 NOTE — Patient Instructions (Signed)
It was nice to meet you!  Please continue taking furosemide 20mg  daily and Entresto 24/26mg  twice a day.  Please call me with any questions, increased dizziness or fall concerns at [phone]

## 2020-07-07 NOTE — Progress Notes (Signed)
Patient ID: [name]                 DOB: 21-Jan-1941                      MRN: [id number]     HPI: [name] is a 79 y.o. female referred by Christy. [place] to pharmacy clinic for HF medication management. PMH is significant for severe AS s/p SJM mech AVR in 2007. She also has a h/o PAF and is chronically anticoagulated with coumadin. Also has a hx of atypical Parikinsons and progressive supranuclear palsy followed by NeurologyShe has a history of bradycardia on beta blocker. Most recent LVEF 30-35% on echo down from 40-45%.  Today she presents today, accompanied by her daughter [name], to pharmacy clinic for further medication titration. At last visit with MD [name] 24/56m BID was started. She was recently seen in the ED for a fall and found to be fluid overload. Started on furosemide. Symptomatically, she is feeling tired. Has some dizziness, lightheadedness upon standing. Lasts only a few seconds, but does seem to be worse when she wakes up to use the restroom at night. Only had one fall since staring [name] and it was unrealted to dizziness per daughter. Denies recent chest pain or palpitations. Feels SOB when she bends over. She does checks her weight at home (normal range 120 - 122 lbs).  No LEE, PND, or orthopnea. Appetite has been good. She use to adhere to a low-salt diet, doesn't may as much attention anymore. Although they have not paid for [name] yet, they state cost is not a big concern. Aware of cost of $80/90 days.  Current CHF meds: furosemide 255mdaily, [name] 24/2681mID, metoprolol tartrate 69m44mily prn HR >110 BP goal: <130/80  Family History: The patient's family history includes [name] in her child; [name] in her mother; [name] (age of onset: 75) 40 her father; [name] in her brother.   Social History: The patient  reports that she has never smoked. She has never used smokeless tobacco. She reports that she does not drink alcohol and  does not use drugs.   Diet: daughter tries not to restrict to much  Exercise: rides stationary bike 0.5 miles- twice a week  Home BP readings: 106/63, 109/69, 106/56, 127/59, 99/49, 110/60, 124/56, 134/76, 84/46, 95/44, 137/88, 118/57, 128/57   Wt Readings from Last 3 Encounters:  06/23/20 126 lb (57.2 kg)  04/01/20 127 lb (57.6 kg)  03/24/20 124 lb (56.2 kg)   BP Readings from Last 3 Encounters:  06/23/20 (!) 132/52  06/17/20 (!) 146/91  04/01/20 128/78   Pulse Readings from Last 3 Encounters:  06/23/20 (!) 59  06/17/20 87  04/01/20 60    Renal function: Estimated Creatinine Clearance: 20.3 mL/min (A) (by C-G formula based on SCr of 1.7 mg/dL (H)).  Past Medical History:  Diagnosis Date  . Arthropathy of cervical facet joint    C2-3  . At risk for falls   . BPV (benign positional vertigo)   . Cancer (HCC)[place]04    breast-lumpectomy,nodes ,radiation,chemo  . Chronic neck pain   . CKD (chronic kidney [name]), stage II   . Diverticulosis of colon   . Elevated troponin    a. 08/2016 in setting of PAF;  b. 08/2016 low risk MV w/ fixed septal defect (LBBB), EF 53%.  . GEMarland KitchenD (gastroesophageal reflux [name])   . Hiatal hernia   . History of aortic valve  stenosis   . History of breast cancer per pt no recurrence   dx 2004-- Left breast DCIS (ER/PR+, HER2 negative) s/p  partial masectomy w/ sln bx and  chemoradiotion (completed 2004)  . History of colon polyps   . History of herpes zoster   . History of kidney stones   . HTN (hypertension)   . Hyperlipidemia   . Hypertrophy of inter-atrial septum    a. 09/2014 Echo: ? of atrial mass; b. 10/2014 Cardiac MRI: no atrial septal mass - polypoid lipomatous hypertrophy of superior atrial septum noted-->Benign.  . Irritable bowel syndrome   . LBBB (left bundle branch block)   . PAF (paroxysmal atrial fibrillation) (HCC)    a. CHA2DS2VASc = 4-->coumadin.  . Parkinsonism [facility])    neurologist-  [name]  . Personal history of  chemotherapy 2004  . Personal history of radiation therapy 2004  . PONV (postoperative nausea and vomiting)   . PVC's (premature ventricular contractions)   . Right ureteral stone   . S/P aortic valve replacement with prosthetic valve    a. 04/13/2006 s/p St Jude mechnical AVR for severe AS;  b. 09/2014 Echo: EF 40-45%, gr1 DD, mild AI/MR, triv TR/PR.  . SUI (stress urinary incontinence, female)   . Symptomatic anemia   . Venous varices      Current Outpatient Medications on File Prior to Visit  Medication Sig Dispense Refill  . acetaminophen (TYLENOL) 500 MG tablet Take 1,000 mg by mouth every 6 (six) hours as needed (head pain).     Marland Kitchen amiodarone (PACERONE) 200 MG tablet Take 1 tablet (200 mg total) by mouth daily. Please make overdue appt with Christy. Lovena [name] before anymore refills. 1st attempt 90 tablet 4  . calcium carbonate (TUMS - DOSED IN MG ELEMENTAL CALCIUM) 500 MG chewable tablet Chew 1 tablet by mouth daily as needed for indigestion or heartburn.     . calcium citrate-vitamin D (CITRACAL+D) 315-200 MG-UNIT per tablet Take 1 tablet by mouth 3 (three) times daily.    . carbidopa-levodopa (SINEMET IR) 25-100 MG tablet TAKE 2 TABLETS BY MOUTH THREE TIMES DAILY (Patient taking differently: Take 2 tablets by mouth 3 (three) times daily. Breakfast, lunch, dinner) 540 tablet 2  . Cholecalciferol (VITAMIN D3) 2000 units TABS Take 2,000 Units by mouth daily.     . famotidine-calcium carbonate-magnesium hydroxide (PEPCID COMPLETE) 10-800-165 MG CHEW chewable tablet Chew 1 tablet by mouth daily as needed (heartburn).    . furosemide (LASIX) 20 MG tablet Take 1 tablet (20 mg total) by mouth daily. 90 tablet 3  . KLOR-CON M20 20 MEQ tablet Take 20 mEq by mouth daily.    Marland Kitchen loratadine (CLARITIN) 10 MG tablet Take 10 mg by mouth daily.    . Melatonin 3 MG TABS Take 3 mg by mouth at bedtime as needed (sleep).     . metoprolol tartrate (LOPRESSOR) 25 MG tablet Take 1 tablet (25 mg total) by mouth daily as  needed (if Christy rate is above 110). 90 tablet 3  . Multiple Vitamin (MULTIVITAMIN) tablet Take 1 tablet by mouth daily at 12 noon.     . pantoprazole (PROTONIX) 40 MG tablet Take 40 mg by mouth daily.    . Probiotic Product (PROBIOTIC-10 PO) Take 1 capsule by mouth daily.     . sacubitril-valsartan ([name]) 24-26 MG Take 1 tablet by mouth 2 (two) times daily. 60 tablet   . simvastatin (ZOCOR) 20 MG tablet Take 20 mg by mouth every evening.    Marland Kitchen  vitamin B-12 (CYANOCOBALAMIN) 1000 MCG tablet Take 1,000 mcg by mouth every morning.    . warfarin (COUMADIN) 5 MG tablet Take 5 mg by mouth daily. Patient is taking 2.74m on Sunday, Tues, TEast [place] Sat. Patient is taking 5 mg on Monday, Wed, Friday.     No current facility-administered medications on file prior to visit.    Allergies  Allergen Reactions  . Demerol [Meperidine] Shortness Of Breath and Swelling  . Oxycodone [name] (See Comments)    Hallucinations   . Adhesive [Tape] [name] (See Comments)    Redness and skin tears  . Ivp Dye [Iodinated Diagnostic Agents] Hives    OK with benadryl pre-med (562mone hour before receiving iodinated contrast agent)  . Phenergan [Promethazine] [name] (See Comments)    Avoids due to reaction with current medications     Assessment/Plan:  1. CHF - BMP was stable after starting [name]. Patients weight is stable. She has had a few blood pressures on the low side 84/46 and 95/44 but all others acceptable. Generally it sounds like the dizziness is short lived and not a huge concern for patient or daughter. I did ask them to call me if it becomes more frequent or more concerning regarding falls. My biggest concern is that patient is already a high fall risk. We did discuss switching to a medication that might lower BP less but daughter wanted to stay on the best medication. She did ask about cutting tablet in half and taking a 1/2 twice a day. Although I do have a few patients on this regimen, the tablets are not  designed to be split. Therefore, I can not guarantee the distribution of the two medications would be even. I would need to defer this decision to Christy. JoMartiniqueFor now, continue [name] 24/2652mID and furosemide 25m33mily. I will follow up with patient once I get feedback from Christy. JordMartiniquellow up with Christy. JordMartiniquethe office in August.  Thank you,  MeliRamond Dialarm.D, BCPS, CPP [id number]eeC[place] [id number]: [phone]: [phone]

## 2020-07-08 ENCOUNTER — Telehealth: Payer: Self-pay

## 2020-07-08 NOTE — Telephone Encounter (Signed)
**Note De-Identified Jawanda Passey Obfuscation** Following message received from covermymeds: Danie Chandler Key: [id number] - PA Case ID: [id number]  Outcome: Approved today  Approved, Coverage Starts on: 07/08/2020 12:00:00 AM, Coverage Ends on: 07/08/2021 12:00:00 AM.  DrugEntresto 24-26MG  tablets  Form [employer] Medicare 4-Part NCPDP Electronic PA Form   I have notified [place] of this approval.

## 2020-07-08 NOTE — Telephone Encounter (Signed)
**Note De-Identified [name] Obfuscation** I started a Entresto PA through covermymeds: Key: [id number]

## 2020-07-09 ENCOUNTER — Other Ambulatory Visit: Payer: Self-pay

## 2020-07-09 ENCOUNTER — Ambulatory Visit
Admission: RE | Admit: 2020-07-09 | Discharge: 2020-07-09 | Disposition: A | Discharge status: Home / Self Care | Payer: PPO | Source: Ambulatory Visit | Attending: Internal Medicine | Admitting: Internal Medicine

## 2020-07-09 DIAGNOSIS — Z1231 Encounter for screening mammogram for malignant neoplasm of breast: Secondary | ICD-10-CM | POA: Diagnosis not present

## 2020-07-17 DIAGNOSIS — I5042 Chronic combined systolic (congestive) and diastolic (congestive) heart failure: Secondary | ICD-10-CM | POA: Insufficient documentation

## 2020-07-17 DIAGNOSIS — I480 Paroxysmal atrial fibrillation: Secondary | ICD-10-CM | POA: Diagnosis not present

## 2020-07-17 DIAGNOSIS — Z7901 Long term (current) use of anticoagulants: Secondary | ICD-10-CM | POA: Diagnosis not present

## 2020-07-17 DIAGNOSIS — I358 Other nonrheumatic aortic valve disorders: Secondary | ICD-10-CM | POA: Diagnosis not present

## 2020-08-04 ENCOUNTER — Telehealth: Payer: Self-pay | Admitting: Neurology

## 2020-08-04 NOTE — Telephone Encounter (Signed)
Spoke with pt who states her Claritin that she takes for allergies is no longer working. Told her this is generally a PCP medication but that she could try Allegra or Zyrtec, she verbalized understanding.

## 2020-08-04 NOTE — Telephone Encounter (Signed)
Patient called in and stated her Claritin that she has been taking for a long time is no longer working. She would like to know what else she can take?

## 2020-08-08 NOTE — Progress Notes (Signed)
Cardiology Office Note   Date:  08/11/2020   ID:  [name], [name], [name], MRN [id number]  PCP:  Leanna Battles, MD  Cardiologist:   Robbi Scurlock [place], MD   Chief Complaint  Patient presents with  . Congestive Heart Failure      History of Present Illness: [name] is a 79 y.o. female seen for evaluation of CHF. She has a  h/o severe AS s/p SJM mech AVR in 2007. She also has a h/o PAF and is chronically anticoagulated with coumadin. She has a history of bradycardia on beta blocker.  September 2017 following a urologic procedure she developed abd pain, nausea, c/p, sob, and palpitations, prompting her to call EMS. She was found to be in AF with RVR, which later converted to sinus rhythm. She was admitted to [facility] and had mild troponin elevation. She underwent stress testing, which was low risk. She was seen in October 2017by Dr Caryl Comes who felt her Afib episodes were infrequent and recommended following for now. If episodes became more frequent then consider resumption of beta blocker or AAD without rate slowing effects such as Tikosyn.   She does have a history of atypical Parikinsons and progressive supranuclear palsy followed by Neurology.   Admitted 12/10-12/14/2019 for Afib RVR, chest pain, anemia (Hgb 5.6) following a fall/trauma, extensive hematoma left chest. She became volume overloaded post transfusion>>BiPAP>>diuresed, low-dose amio started for elevated HR.  Seen by Dr Lovena Le in March 2020 and appeared to be maintaining NSR on amiodarone. She has multiple falls.  She was seen in the ED on 09/09/19 with mechanical fall and facial laceration. CT negative for bleed. INR therapeutic. She denies any significant palpitations. No syncope. No chest pain or dyspnea.   When seen in [facility] in March 2021 only noted 2 episodes of Afib lasting minutes. Seen in ED on March 29 following a fall. CT and Xrays negative.   She was seen recently in the ED on  06/19/20 after a mechanical fall and scalp laceration. Head CT showed no bleeding. She complained of increased wheezing and cough. CXR showed CM with low lung volumes. No clear edema. BNP was elevated at 725. She was DC on lasix 20 mg daily and outpatient Echo ordered. This demonstrated significant decreased in EF to [name]-35% down from 40-45% with global HK. AV prosthesis was functioning normally. Severe LAE. She was started on [facility] as outpatient. Follow up with pharmacy. Initially has a couple of low BP readings but over the last 2 weeks BP has been fine. On August 10 she awoke with heart racing and notes BP severely elevated 220/171. An hour later BP back to normal. She has been having daily HA. A couple of episodes of diarrhea. Breathing is doing well.   Balance is still poor. No falls that have required attention.    Past Medical History:  Diagnosis Date  . Arthropathy of cervical facet joint    C2-3  . At risk for falls   . BPV (benign positional vertigo)   . Cancer ([place]) 2004    breast-lumpectomy,nodes ,radiation,chemo  . Chronic neck pain   . CKD (chronic kidney disease), stage II   . Diverticulosis of colon   . Elevated troponin    a. 08/2016 in setting of PAF;  b. 08/2016 low risk MV w/ fixed septal defect (LBBB), EF 53%.  Marland Kitchen GERD (gastroesophageal reflux disease)   . Hiatal hernia   . History of aortic valve stenosis   . History  of breast cancer per pt no recurrence   dx 2004-- Left breast DCIS (ER/PR+, HER2 negative) s/p  partial masectomy w/ sln bx and  chemoradiotion (completed 2004)  . History of colon polyps   . History of herpes zoster   . History of kidney stones   . HTN (hypertension)   . Hyperlipidemia   . Hypertrophy of inter-atrial septum    a. 09/2014 Echo: ? of atrial mass; b. 10/2014 Cardiac MRI: no atrial septal mass - polypoid lipomatous hypertrophy of superior atrial septum noted-->Benign.  . Irritable bowel syndrome   . LBBB (left bundle branch block)   . PAF  (paroxysmal atrial fibrillation) (HCC)    a. CHA2DS2VASc = 4-->coumadin.  . Parkinsonism [facility])    neurologist-  dr tat  . Personal history of chemotherapy 2004  . Personal history of radiation therapy 2004  . PONV (postoperative nausea and vomiting)   . PVC's (premature ventricular contractions)   . Right ureteral stone   . S/P aortic valve replacement with prosthetic valve    a. 04/13/2006 s/p St Jude mechnical AVR for severe AS;  b. 09/2014 Echo: EF 40-45%, gr1 DD, mild AI/MR, triv TR/PR.  . SUI (stress urinary incontinence, female)   . Symptomatic anemia   . Venous varices      Past Surgical History:  Procedure Laterality Date  . ANTERIOR AND POSTERIOR REPAIR  1990   cystocele/ rectocele  . AORTIC VALVE REPLACEMENT  04/13/06   dr gerhardt   and Replacement Ascending Aorta (St. Jude mechanical prosthesis)  . APPENDECTOMY  child 1950  . BREAST EXCISIONAL BIOPSY Left   . BREAST LUMPECTOMY Left 01/11/2003   malignant  . BREAST SURGERY Left 2004  . CARDIAC CATHETERIZATION  02-22-2001   dr Martinique   minimal non-obstructive cad/  mild aortic stenosis and aortic root enlargement/  normal LVF, ef 65%  . CARDIAC CATHETERIZATION  03-22-2006    dr Martinique   no significant obstructive cad/  severe aortic stenosis and mild to moderate aortic root enlargement/  upper normal right heart pressures  . CARDIOVASCULAR STRESS TEST  09-30-2011   dr Martinique   lexiscan nuclear study w/ apical thinning  vs  small prior apical infarct w/ no significant ischemia/  hypokinesis of the distal septum and apex, ef 50%  . CARPAL TUNNEL RELEASE Bilateral left 09-09-2004/  right 2007  . CATARACT EXTRACTION W/ INTRAOCULAR LENS  IMPLANT, BILATERAL  2015  . COLONOSCOPY N/A 10/03/2014   Procedure: COLONOSCOPY;  Surgeon: [name], MD;  Location: [place];  Service: Endoscopy;  Laterality: N/A;  . CYSTOSCOPY WITH RETROGRADE PYELOGRAM, URETEROSCOPY AND STENT PLACEMENT Right 09/17/2016   Procedure: CYSTOSCOPY WITH  RIGHT RETROGRADE PYELOGRAM, RIGHT URETEROSCOPY AND STENT PLACEMENT LASER LITHOTRIPSY, RIGHT STENT PLACEMENT;  Surgeon: [name], MD;  Location[facility];  Service: Urology;  Laterality: Right;  . ESOPHAGEAL MANOMETRY N/A 08/12/2014   Procedure: ESOPHAGEAL MANOMETRY (EM);  Surgeon: [name], MD;  Location: [facility];  Service: Endoscopy;  Laterality: N/A;  . ESOPHAGOGASTRODUODENOSCOPY N/A 10/03/2014   Procedure: ESOPHAGOGASTRODUODENOSCOPY (EGD);  Surgeon: [name], MD;  Location: [facility] ENDOSCOPY;  Service: Endoscopy;  Laterality: N/A;  . ESOPHAGOGASTRODUODENOSCOPY N/A 12/08/2018   Procedure: ESOPHAGOGASTRODUODENOSCOPY (EGD);  Surgeon: Doran Stabler, MD;  Location: [place];  Service: Gastroenterology;  Laterality: N/A;  . [place]  . FOOT SURGERY Right 2013   hammertoe x2  . HOLMIUM LASER APPLICATION Right 12/28/5850   Procedure:  HOLMIUM LASER APPLICATION;  Surgeon: [name], MD;  Location: [facility];  Service: Urology;  Laterality: Right;  . PERCUTANEOUS NEPHROSTOLITHOTOMY  1993  . STONE EXTRACTION WITH BASKET Right 09/17/2016   Procedure: STONE EXTRACTION WITH BASKET;  Surgeon: [name], MD;  Location: [facility];  Service: Urology;  Laterality: Right;  . TOTAL ABDOMINAL HYSTERECTOMY  1971   w/ Bilateral Salpingoophorectomy  . TRANSTHORACIC ECHOCARDIOGRAM  10/24/2014   dr Martinique   hypokinesis of the basal and mid inferoseptal and inferior walls,  ef 40-45%,  grade 1 diastolic dysfunction/  mechanical bileaflet aortic valve noted w/ mild regur., peak grandient 52mHg, valve area 1.35cm^2/  severe MV calcification without stenosis w/ mild regurg., peak gradient 352mg/  mild LAE/  poss. atrial mass (MRI showed benign lipomatous hypertrophy of atrial septum) /tFrazier ButtR/mild TR     Current Outpatient Medications  Medication Sig Dispense Refill  . acetaminophen  (TYLENOL) 500 MG tablet Take 1,000 mg by mouth every 6 (six) hours as needed (head pain).     . Marland Kitchenmiodarone (PACERONE) 200 MG tablet Take 1 tablet (200 mg total) by mouth daily. Please make overdue appt with Dr. TaLovena Leefore anymore refills. 1st attempt 90 tablet 4  . calcium carbonate (TUMS - DOSED IN MG ELEMENTAL CALCIUM) 500 MG chewable tablet Chew 1 tablet by mouth daily as needed for indigestion or heartburn.     . calcium citrate-vitamin D (CITRACAL+D) 315-200 MG-UNIT per tablet Take 1 tablet by mouth 3 (three) times daily.    . carbidopa-levodopa (SINEMET IR) 25-100 MG tablet TAKE 2 TABLETS BY MOUTH THREE TIMES DAILY (Patient taking differently: Take 2 tablets by mouth 3 (three) times daily. Breakfast, lunch, dinner) 540 tablet 2  . Cholecalciferol (VITAMIN D3) 2000 units TABS Take 2,000 Units by mouth daily.     . famotidine-calcium carbonate-magnesium hydroxide (PEPCID COMPLETE) 10-800-165 MG CHEW chewable tablet Chew 1 tablet by mouth daily as needed (heartburn).    . furosemide (LASIX) 20 MG tablet Take 1 tablet (20 mg total) by mouth daily. 90 tablet 3  . KLOR-CON M20 20 MEQ tablet Take 20 mEq by mouth daily.    . Marland Kitchenoratadine (CLARITIN) 10 MG tablet Take 10 mg by mouth daily.    . Melatonin 3 MG TABS Take 3 mg by mouth at bedtime as needed (sleep).     . metoprolol tartrate (LOPRESSOR) 25 MG tablet Take 1 tablet (25 mg total) by mouth daily as needed (if heart rate is above 110). 90 tablet 3  . Multiple Vitamin (MULTIVITAMIN) tablet Take 1 tablet by mouth daily at 12 noon.     . pantoprazole (PROTONIX) 40 MG tablet Take 40 mg by mouth daily.    . Probiotic Product (PROBIOTIC-10 PO) Take 1 capsule by mouth daily.     . sacubitril-valsartan (ENTRESTO) 24-26 MG Take 1 tablet by mouth 2 (two) times daily. 180 tablet 3  . simvastatin (ZOCOR) 20 MG tablet Take 20 mg by mouth every evening.    . vitamin B-12 (CYANOCOBALAMIN) 1000 MCG tablet Take 1,000 mcg by mouth every morning.    . warfarin  (COUMADIN) 5 MG tablet Take 5 mg by mouth daily. Patient is taking 2.84m29mn Sunday, Tues, ThuBuckleyat. Patient is taking 5 mg on Monday, Wed, Friday.     No current facility-administered medications for this visit.    Allergies:   Demerol [meperidine], Oxycodone, Adhesive [tape], Ivp dye [iodinated diagnostic agents], and Phenergan [promethazine]    Social  History:  The patient  reports that she has never smoked. She has never used smokeless tobacco. She reports that she does not drink alcohol and does not use drugs.   Family History:  The patient's family history includes Healthy in her child; Heart disease in her mother; Heart disease (age of onset: 36) in her father; Other in her brother.    ROS:  Please see the history of present illness.   Otherwise, review of systems are positive for none.   All other systems are reviewed and negative.    PHYSICAL EXAM: VS:  BP (!) 109/58   Pulse (!) 59   Ht 4' 11.75" (1.518 m)   Wt 122 lb 9.6 oz (55.6 kg)   SpO2 97%   BMI 24.14 kg/m  , BMI Body mass index is 24.14 kg/m. GEN: Well nourished, well developed, in no acute distress  HEENT: normal  Neck: no JVD, carotid bruits, or masses Cardiac: RRR; good mechanical AV click. SEM RUSB Respiratory:  clear to auscultation bilaterally, normal work of breathing GI: soft, nontender, nondistended, + BS MS: no deformity or atrophy  Skin: warm and dry, no rash Neuro:  Strength and sensation are intact Psych: euthymic mood, full affect   EKG:  EKG is not ordered today. The ekg ordered today demonstrates N/A   Recent Labs: 04/01/2020: TSH 4.370 06/17/2020: ALT 36; B Natriuretic Peptide 725.1; Hemoglobin 11.6; Platelets 183 07/01/2020: BUN 32; Creatinine, Ser 1.70; Potassium 4.0; Sodium 141    Lipid Panel    Component Value Date/Time   CHOL 182 04/01/2020 0849   TRIG 92 04/01/2020 0849   HDL 89 04/01/2020 0849   CHOLHDL 2.0 04/01/2020 0849   CHOLHDL 1.9 09/30/2011 0415   VLDL 8 09/30/2011 0415     LDLCALC 77 04/01/2020 0849    Dated 06/12/19: cholesterol 147, triglycerides 74, HDL 64, LDL 68.   Wt Readings from Last 3 Encounters:  08/11/20 122 lb 9.6 oz (55.6 kg)  06/23/20 126 lb (57.2 kg)  04/01/20 127 lb (57.6 kg)      Other studies Reviewed: Additional studies/ records that were reviewed today include:   11/17/18: TTE Study Conclusions - Left ventricle: The cavity size was normal. Wall thickness was normal. Systolic function was mildly reduced. The estimated ejection fraction was in the range of 45% to 50%. Features are consistent with a pseudonormal left ventricular filling pattern, with concomitant abnormal relaxation and increased filling pressure (grade 2 diastolic dysfunction). - Aortic valve: A mechanical prosthesis was present. There was mild regurgitation. Valve area (VTI): 1.43 cm^2. Valve area (Vmax): 1.28 cm^2. Valve area (Vmean): 1.2 cm^2. - Mitral valve: Mildly to moderately calcified annulus. Valve area by continuity equation (using LVOT flow): 1.55 cm^2. - Left atrium: The atrium was mildly dilated.   09/21/18 stress myoview IMPRESSION: 1. No evidence reversible ischemia. Fixed defect in the septal wall with differential including infarction versus artifact from LEFT bundle branch block. 2. Septal dyskinesia in patient with LEFT bundle branch block. 3. Left ventricular ejection fraction 53% 4. Non invasive risk stratification*: Low  Echo 06/20/20: IMPRESSIONS    1. Moderate global reduction in LV systolic function; s/p AVR with mild  AI and mean gradient 12 mmHg; biatrial enlargement; mild TR.  2. Left ventricular ejection fraction, by estimation, is [name] to 35%. The  left ventricle has moderately decreased function. The left ventricle  demonstrates global hypokinesis. Left ventricular diastolic parameters are  consistent with Grade II diastolic  dysfunction (pseudonormalization).  3. Right ventricular  systolic function is  normal. The right ventricular  size is normal. Tricuspid regurgitation signal is inadequate for assessing  PA pressure.  4. Left atrial size was severely dilated.  5. Right atrial size was mildly dilated.  6. The mitral valve is normal in structure. Trivial mitral valve  regurgitation. No evidence of mitral stenosis.  7. The aortic valve has been repaired/replaced. Aortic valve  regurgitation is mild. No aortic stenosis is present. There is a unknown  St. Jude mechanical valve present in the aortic position. Procedure Date:  04/13/2006.    ASSESSMENT AND PLAN:  1. Acute on chronic combined systolic/diastolic CHF. EF down to [name]-35%. On Metoprolol. Will continue lasix 20 mg daily. She was started on Entresto at 24/26 mg bid. Tolerating this well. will continue for another month since she has plenty of pills  Then try and uptitrate to 49/51 mg bid. Sodium restriction.   2.  PAF/Tachy-brady syndrome: Pt is now on amiodarone and appears to be maintaining NSR well.   Previously she had history of severe brady on beta blockers. This will limit titration of beta blocker.   2. S/P SJM AVR: Stable on echo in September 2019.  continueChronic coumadin. SBE prophylaxis. Good valve click on exam. Having multiple falls but with mechanical valve really can't stop Coumadin  3. Essential HTN: Controlled  4. VJD:[id number]. Cont statin.   5. CKD    Current medicines are reviewed at length with the patient today.  The patient does not have concerns regarding medicines.  The following changes have been made:  Anticipate increased Entresto next month to 49/51 mg bid.  Labs/ tests ordered today include:   No orders of the defined types were placed in this encounter.    Disposition:   FU with me in 3 months  Signed, Javelle Donigan [place], MD  08/11/2020 3:22 PM    [facility] [address], [place], [place], [address] Phone [phone], Fax [phone]

## 2020-08-11 ENCOUNTER — Ambulatory Visit: Payer: PPO | Admitting: Cardiology

## 2020-08-11 ENCOUNTER — Other Ambulatory Visit: Payer: Self-pay

## 2020-08-11 ENCOUNTER — Encounter: Payer: Self-pay | Admitting: Cardiology

## 2020-08-11 VITALS — BP 109/58 | HR 59 | Ht 59.75 in | Wt 122.6 lb

## 2020-08-11 DIAGNOSIS — I480 Paroxysmal atrial fibrillation: Secondary | ICD-10-CM

## 2020-08-11 DIAGNOSIS — Z952 Presence of prosthetic heart valve: Secondary | ICD-10-CM | POA: Diagnosis not present

## 2020-08-11 DIAGNOSIS — I5042 Chronic combined systolic (congestive) and diastolic (congestive) heart failure: Secondary | ICD-10-CM

## 2020-08-11 DIAGNOSIS — D6869 Other thrombophilia: Secondary | ICD-10-CM

## 2020-08-19 DIAGNOSIS — Z7901 Long term (current) use of anticoagulants: Secondary | ICD-10-CM | POA: Diagnosis not present

## 2020-08-19 DIAGNOSIS — I480 Paroxysmal atrial fibrillation: Secondary | ICD-10-CM | POA: Diagnosis not present

## 2020-08-19 DIAGNOSIS — Z954 Presence of other heart-valve replacement: Secondary | ICD-10-CM | POA: Diagnosis not present

## 2020-09-05 ENCOUNTER — Telehealth: Payer: Self-pay | Admitting: Cardiology

## 2020-09-05 NOTE — Telephone Encounter (Signed)
Pt c/o medication issue:  1. Name of Medication: sacubitril-valsartan (ENTRESTO) 24-26 MG  2. How are you currently taking this medication (dosage and times per day)? 1 tablet by mouth 2 (two) times daily  3. Are you having a reaction (difficulty breathing--STAT)? No   4. What is your medication issue? Patient states the medication is causing extremely low BP. She reports today her BP was 80/50 with a HR of 61 and yesterday her BP was 94/60 with a HR of 81. She states she has also been experiencing dizziness when standing. However, she states she does not feel faint. Please return call and advise.

## 2020-09-05 NOTE — Telephone Encounter (Signed)
Spoke to patient he stated her B/P has been low the past couple of days 92/50,94/50,80/50.Stated B/P has not been low until she started [place].Advised I will send message to [place] for advice.

## 2020-09-05 NOTE — Telephone Encounter (Signed)
Spoke to patient Dr.Jordan's advice given.She will call back on Mon 9/13 with B/P readings.

## 2020-09-05 NOTE — Telephone Encounter (Signed)
I would hold Entresto. Once BP improves we can try losartan 12.5 mg daily   Josalin Carneiro [place] MD, [facility]

## 2020-09-07 IMAGING — CT CT ABD-PELV W/O CM
2 of 5 series · 14 of 46 positions shown, 16 images · non-contrast
Comparison: Portable chest obtained earlier today.

CLINICAL DATA: Chest pain and chronic shortness of breath. Minor
blunt abdominal trauma.

EXAM:
CT CHEST, ABDOMEN AND PELVIS WITHOUT CONTRAST
TECHNIQUE: Multidetector CT imaging of the chest, abdomen and pelvis was
performed following the standard protocol without IV contrast.

[Series 6: cap w/o 3.0 mm st cor · coronal · non-contrast · 0.77mm/px · 3 of 117 slices shown]
[im 39/117  soft-tissue]
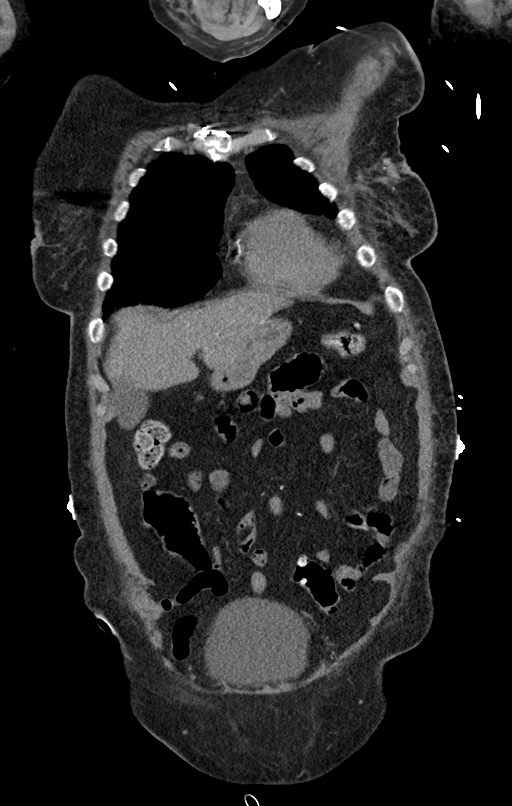
[im 52/117  soft-tissue]
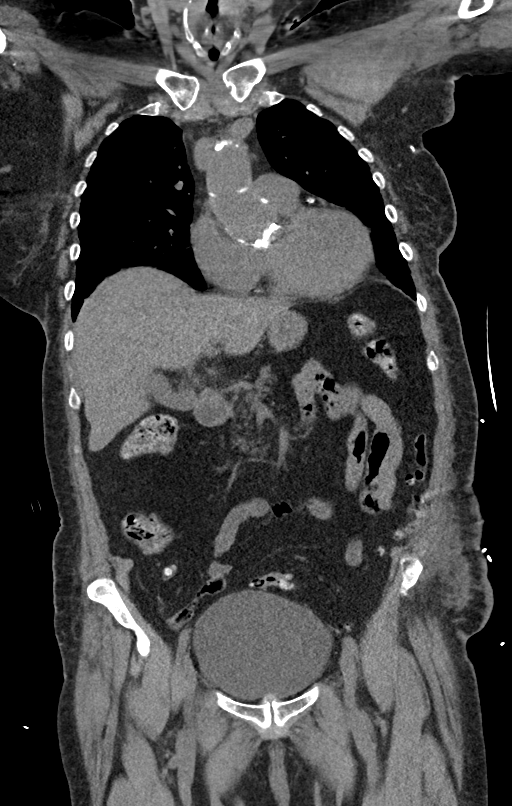
[im 65/117  soft-tissue]
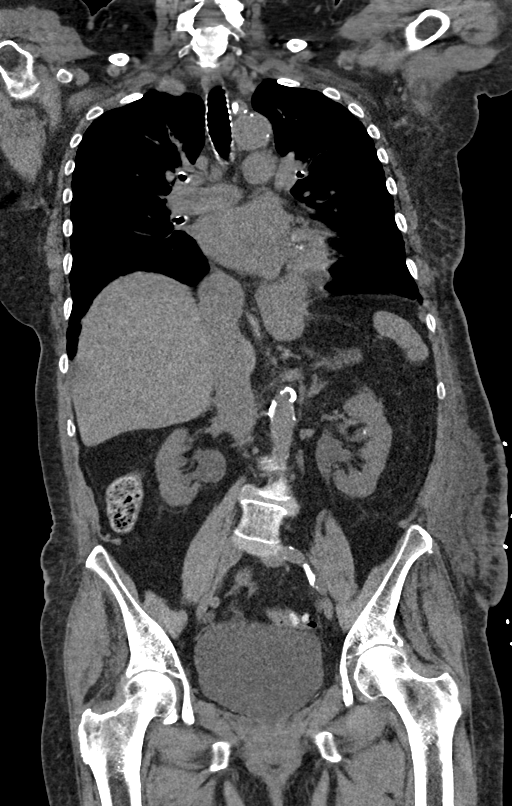

[Series 8: cap w/o 2.0 mm st · axial · non-contrast · 0.94mm/px · z∈[+268,+808]mm · 11 of 310 slices shown, 13 images]
[im 20/310  soft-tissue]
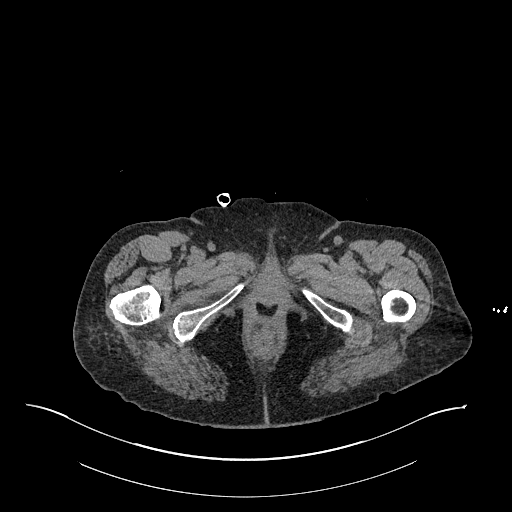
[im 20/310  bone]
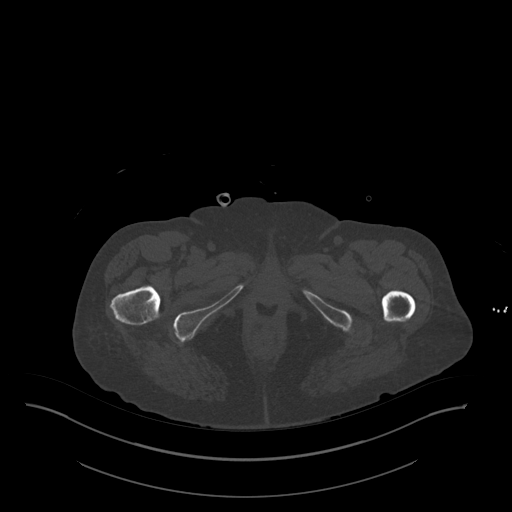
[im 58/310  soft-tissue]
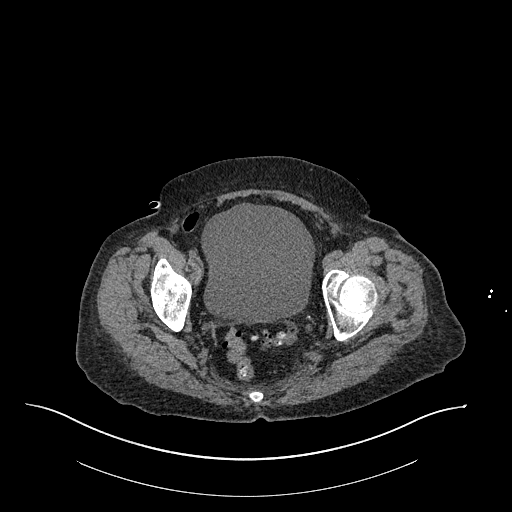
[im 78/310  soft-tissue]
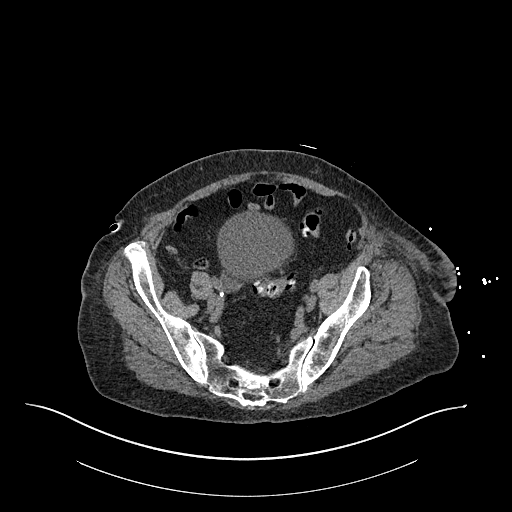
[im 97/310  soft-tissue]
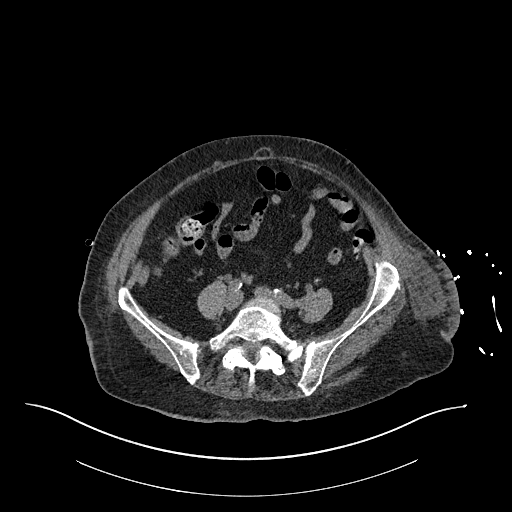
[im 136/310  soft-tissue]
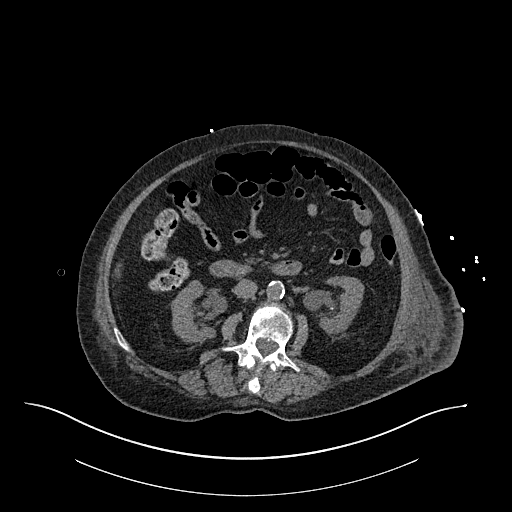
[im 155/310  soft-tissue]
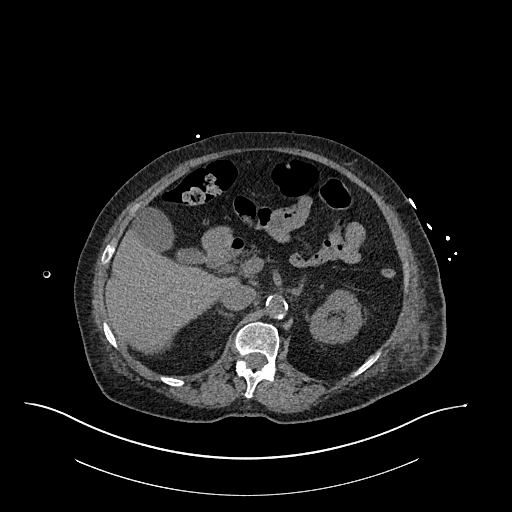
[im 174/310  soft-tissue]
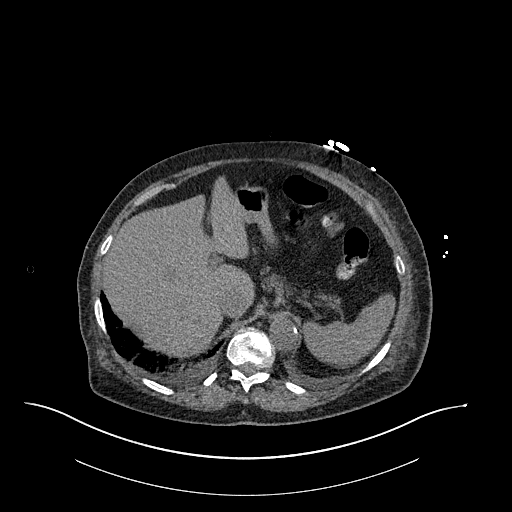
[im 213/310  soft-tissue]
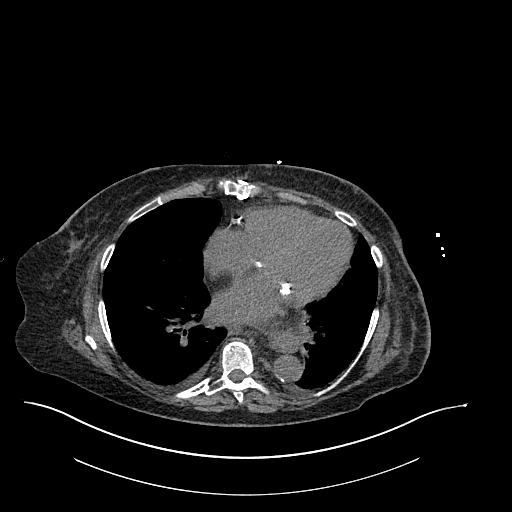
[im 232/310  soft-tissue]
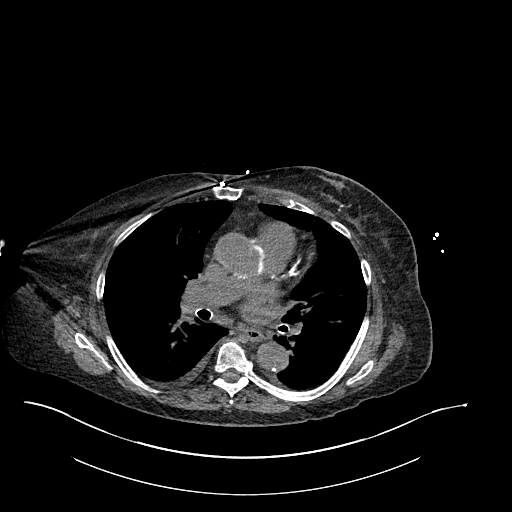
[im 232/310  bone]
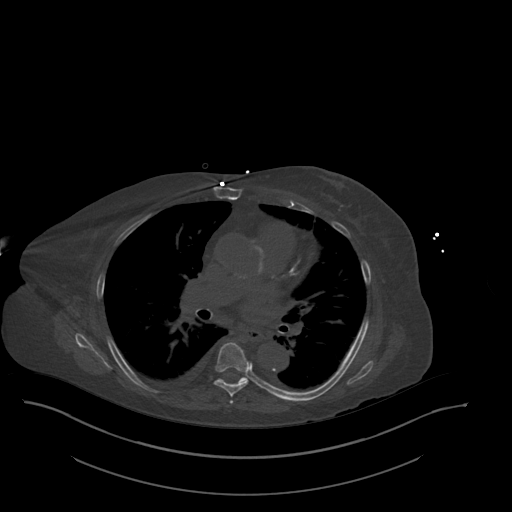
[im 252/310  soft-tissue]
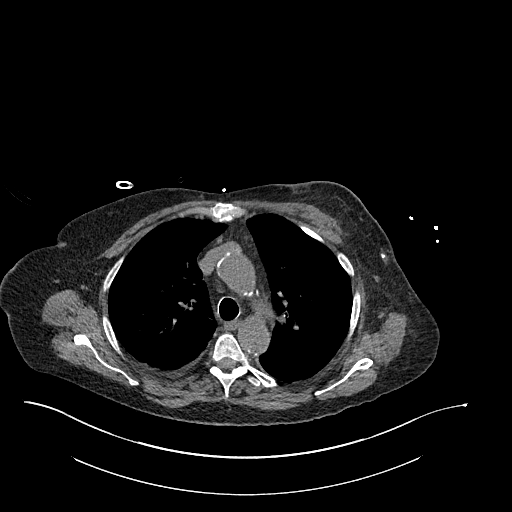
[im 290/310  soft-tissue]
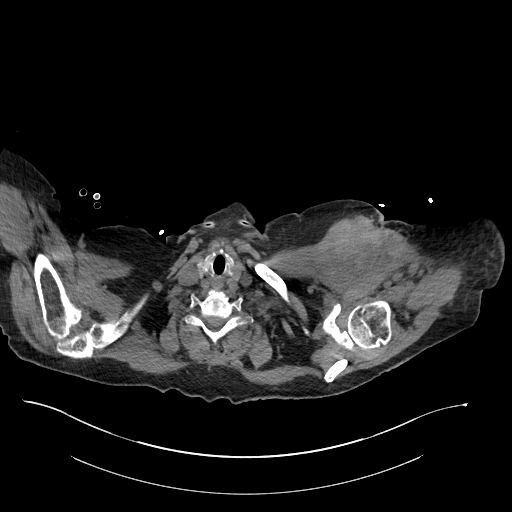

[14 of 46 positions shown; findings below may reference images not displayed]

Chest and
abdomen radiographs dated 09/19/2016. Abdomen and pelvis CT dated
08/10/2016.
FINDINGS: CT CHEST FINDINGS

Cardiovascular: Atheromatous calcifications, including the coronary
arteries and aorta. Post CABG changes. Borderline enlarged heart.
Dense mitral valve annulus calcifications.

Mediastinum/Nodes: No enlarged mediastinal, hilar, or axillary lymph
nodes. Thyroid gland, trachea, and esophagus demonstrate no
significant findings. A moderately large hiatal hernia is noted.

Lungs/Pleura: Small bilateral pleural effusions. Interstitial
thickening throughout the majority of both lungs. Seven by 6 mm
nodule in the posterolateral aspect of the left lower lobe on image
number 114 series 5, confirmed on the sagittal and coronal
reconstruction images. This is unchanged since 08/10/2016. No new
nodules are seen.

Musculoskeletal: Thoracic and lower cervical spine degenerative
changes.

CT ABDOMEN PELVIS FINDINGS

Hepatobiliary: No focal liver abnormality is seen. No gallstones,
gallbladder wall thickening, or biliary dilatation.

Pancreas: Diffusely atrophied.

Spleen: Normal in size without focal abnormality.

Adrenals/Urinary Tract: Several left renal calculi measuring up to
11 mm in maximum diameter each. Tiny mid upper right renal calculus
on coronal image number 68 series 6.

Mild dilatation of the right renal collecting system and extrarenal
pelvis with progression. Mildly dilated right ureter to the level of
the ureterovesical junction with no obstructing calculus seen.

Interval mild dilatation of the left renal collecting system and
ureter to the level of the ureterovesical junction with no
obstructing calculus seen. Mildly distended urinary bladder.

Stomach/Bowel: Moderately large hiatal hernia. Multiple colonic
diverticula without evidence of diverticulitis. Normal appearing
appendix and small bowel.

Vascular/Lymphatic: Atheromatous arterial calcifications without
aneurysm. No enlarged lymph nodes.

Reproductive: Status post hysterectomy. No adnexal masses.

Other: Left lateral subcutaneous edema and skin thickening. Tiny
umbilical hernia containing fat.

Musculoskeletal: Stable lumbar spine degenerative changes and
scoliosis, including facet degenerative changes with associated
grade 1 anterolisthesis at the L4-5 level, without significant
change. No fractures or pars defects are seen.
IMPRESSION: 1. Left lateral subcutaneous edema and skin thickening compatible
with bruising.
2. Small bilateral pleural effusions.
3. Changes of congestive heart failure with interstitial pulmonary
edema.
4. Stable 6 mm left lower lobe nodule. The long-term stability is
compatible with a benign process.
5. Bilateral nonobstructing renal calculi.
6. Interval mild bilateral hydronephrosis and hydroureter to the
level of the ureterovesical junction, without obstructing calculus
seen. This is most likely physiological due to bladder distention.
7. Moderately large hiatal hernia.
8. Colonic diverticulosis.

Aortic Atherosclerosis ([id number]).

## 2020-09-08 NOTE — Progress Notes (Signed)
Assessment/Plan:   1.  PSP  -Patient understands differences between PSP and Parkinson's disease.  Understands that there is an increased risk of falls, aspiration as subsequent morbidity and mortality from this disease compared to Parkinson's disease.  Understands needs to stay seated at all times.  -discussed that I don't want patient using cane.  She is okay with the walker for now but discussed that we are close to being WC bound at all times.  They have a WC at home now.  -they ask about home PT.  Her insurance company has been difficult to work with regarding PSP specific PT.  They may allow [facility] and the pt/husband would like to try.   2.  Neck pain with cervical dystonia  -This is actually improved with levodopa therapy.  -Her last MRI was in 2017 with evidence of severe C2/C3 left facet arthropathy with joint effusion and bone marrow edema.  She received injections at the time and pain improved.  3.  Cough, likely associated with reflux  -Follows with primary care.  Is on Pepcid and PPI.  Last modified barium swallow was in June, 2019 and was normal.  Subjective:   [name] was seen today in follow up for PSP.  My previous records were reviewed prior to todays visit as well as outside records available to me.  This patient is accompanied in the office by her spouse who supplements the history. Pt has had falls and was in the emergency room in June after a fall.  She was walking and one of the wheels on her walker rolled sideways and she fell and hit her head on the windowsill.  There was no loss of consciousness.  Head CT was nonacute.  She required staples in the scalp.  A few other "easy" falls per husband where didn't get hurt.  States that he has been very careful last few days as current INR is 8.0!  She was told to eat everything green!  Coumadin is on hold.  Pt denies lightheadedness, near syncope.  No hallucinations.  Mood has been good.  Not choking on  food.  Current prescribed movement disorder medications: Carbidopa/levodopa 25/100, 2 tablets 3 times per day    ALLERGIES:   Allergies  Allergen Reactions  . Demerol [Meperidine] Shortness Of Breath and Swelling  . Oxycodone Other (See Comments)    Hallucinations   . Adhesive [Tape] Other (See Comments)    Redness and skin tears  . Ivp Dye [Iodinated Diagnostic Agents] Hives    OK with benadryl pre-med (50mg  one hour before receiving iodinated contrast agent)  . Phenergan [Promethazine] Other (See Comments)    Avoids due to reaction with current medications    CURRENT MEDICATIONS:  Outpatient Encounter Medications as of 09/11/2020  Medication Sig  . acetaminophen (TYLENOL) 500 MG tablet Take 1,000 mg by mouth every 6 (six) hours as needed (head pain).   Marland Kitchen amiodarone (PACERONE) 200 MG tablet Take 1 tablet (200 mg total) by mouth daily. Please make overdue appt with Dr. Lovena Le before anymore refills. 1st attempt  . calcium carbonate (TUMS - DOSED IN MG ELEMENTAL CALCIUM) 500 MG chewable tablet Chew 1 tablet by mouth daily as needed for indigestion or heartburn.   . calcium citrate-vitamin D (CITRACAL+D) 315-200 MG-UNIT per tablet Take 1 tablet by mouth 3 (three) times daily.  . carbidopa-levodopa (SINEMET IR) 25-100 MG tablet TAKE 2 TABLETS BY MOUTH THREE TIMES DAILY (Patient taking differently: Take 2 tablets by mouth  3 (three) times daily. Breakfast, lunch, dinner)  . Cholecalciferol (VITAMIN D3) 2000 units TABS Take 2,000 Units by mouth daily.   . famotidine-calcium carbonate-magnesium hydroxide (PEPCID COMPLETE) 10-800-165 MG CHEW chewable tablet Chew 1 tablet by mouth daily as needed (heartburn).  . furosemide (LASIX) 20 MG tablet Take 1 tablet (20 mg total) by mouth daily.  Marland Kitchen KLOR-CON M20 20 MEQ tablet Take 20 mEq by mouth daily.  Marland Kitchen loratadine (CLARITIN) 10 MG tablet Take 10 mg by mouth daily.  . Melatonin 3 MG TABS Take 3 mg by mouth at bedtime as needed (sleep).   . Multiple  Vitamin (MULTIVITAMIN) tablet Take 1 tablet by mouth daily at 12 noon.   . pantoprazole (PROTONIX) 40 MG tablet Take 40 mg by mouth daily.  . Probiotic Product (PROBIOTIC-10 PO) Take 1 capsule by mouth daily.   . simvastatin (ZOCOR) 20 MG tablet Take 20 mg by mouth every evening.  . vitamin B-12 (CYANOCOBALAMIN) 1000 MCG tablet Take 1,000 mcg by mouth every morning.  . warfarin (COUMADIN) 5 MG tablet Take 5 mg by mouth daily. Patient is taking 2.5mg  on Sunday, Tues, [place]. Sat. Patient is taking 5 mg on Monday, Wed, Friday.  . metoprolol tartrate (LOPRESSOR) 25 MG tablet Take 1 tablet (25 mg total) by mouth daily as needed (if heart rate is above 110).   No facility-administered encounter medications on file as of 09/11/2020.    Objective:   PHYSICAL EXAMINATION:    VITALS:   Vitals:   09/11/20 0804  BP: 97/62  Pulse: 68  SpO2: 96%  Weight: 119 lb (54 kg)  Height: 4' 11.75" (1.518 m)    GEN:  The patient appears stated age and is in NAD. HEENT:  Normocephalic, atraumatic.  The mucous membranes are moist. The superficial temporal arteries are without ropiness or tenderness. CV:  RRR Lungs:  CTAB Neck/HEME:  There are no carotid bruits bilaterally.  Neurological examination:  Orientation: The patient is alert and oriented x3. Cranial nerves: There is good facial symmetry with facial hypomimia. There is mild upgaze and down paresis.  She c/o diplopia with it.  The speech is fluent and clear. Soft palate rises symmetrically and there is no tongue deviation. Hearing is intact to conversational tone. Sensation: Sensation is intact to light touch throughout Motor: Strength is at least antigravity x4.  Movement examination: Tone: There is normal tone in the UE/LE Abnormal movements: none Coordination:  There is no decremation with RAM's, but she is slow Gait and Station: The patient has difficulty arising out of a deep-seated chair without the use of the hands. The patient's stride  length is very short and she is unstable (she has cane but is very unstable with it).    I have reviewed and interpreted the following labs independently    Chemistry      Component Value Date/Time   NA 141 07/01/2020 0837   K 4.0 07/01/2020 0837   CL 101 07/01/2020 0837   CO2 27 07/01/2020 0837   BUN 32 (H) 07/01/2020 0837   CREATININE 1.70 (H) 07/01/2020 0837      Component Value Date/Time   CALCIUM 9.8 07/01/2020 0837   ALKPHOS 81 06/17/2020 0845   AST 53 (H) 06/17/2020 0845   ALT 36 06/17/2020 0845   BILITOT 0.7 06/17/2020 0845   BILITOT 0.4 04/01/2020 0849       Lab Results  Component Value Date   WBC 5.0 06/17/2020   HGB 11.6 (L) 06/17/2020   HCT  36.0 06/17/2020   MCV 103.4 (H) 06/17/2020   PLT 183 06/17/2020    Lab Results  Component Value Date   TSH 4.370 04/01/2020     Total time spent on today's visit was 30 minutes, including both face-to-face time and nonface-to-face time.  Time included that spent on review of records (prior notes available to me/labs/imaging if pertinent), discussing treatment and goals, answering patient's questions and coordinating care.  Cc:  Leanna Battles, MD

## 2020-09-10 DIAGNOSIS — Z7901 Long term (current) use of anticoagulants: Secondary | ICD-10-CM | POA: Diagnosis not present

## 2020-09-10 DIAGNOSIS — I480 Paroxysmal atrial fibrillation: Secondary | ICD-10-CM | POA: Diagnosis not present

## 2020-09-10 DIAGNOSIS — Z952 Presence of prosthetic heart valve: Secondary | ICD-10-CM | POA: Diagnosis not present

## 2020-09-10 NOTE — Telephone Encounter (Signed)
Spoke to patient she stated her B/P is better but on low side 100/63,100/57,121/51.Dr.Jordan advised do not take Losartan.Advised to continue to continue to monitor B/P and bring readings to next appointment.

## 2020-09-11 ENCOUNTER — Encounter: Payer: Self-pay | Admitting: Neurology

## 2020-09-11 ENCOUNTER — Ambulatory Visit: Payer: PPO | Admitting: Neurology

## 2020-09-11 ENCOUNTER — Other Ambulatory Visit: Payer: Self-pay

## 2020-09-11 VITALS — BP 97/62 | HR 68 | Ht 59.75 in | Wt 119.0 lb

## 2020-09-11 DIAGNOSIS — G231 Progressive supranuclear ophthalmoplegia [Steele-Richardson-Olszewski]: Secondary | ICD-10-CM | POA: Diagnosis not present

## 2020-09-11 NOTE — Patient Instructions (Signed)
We will contact [facility].  The physicians and staff at [facility] are committed to providing excellent care. You may receive a survey requesting feedback about your experience at our office. We strive to receive "very good" responses to the survey questions. If you feel that your experience would prevent you from giving the office a "very good " response, please contact our office to try to remedy the situation. We may be reached at [phone]. Thank you for taking the time out of your busy day to complete the survey.

## 2020-09-11 NOTE — Addendum Note (Signed)
Addended by: Ulice Brilliant T on: 09/11/2020 08:33 AM   Modules accepted: Orders

## 2020-09-15 DIAGNOSIS — S7001XA Contusion of right hip, initial encounter: Secondary | ICD-10-CM | POA: Diagnosis not present

## 2020-09-15 DIAGNOSIS — M25551 Pain in right hip: Secondary | ICD-10-CM | POA: Diagnosis not present

## 2020-09-16 DIAGNOSIS — Z7901 Long term (current) use of anticoagulants: Secondary | ICD-10-CM | POA: Diagnosis not present

## 2020-09-16 DIAGNOSIS — Z23 Encounter for immunization: Secondary | ICD-10-CM | POA: Diagnosis not present

## 2020-09-16 DIAGNOSIS — Z952 Presence of prosthetic heart valve: Secondary | ICD-10-CM | POA: Diagnosis not present

## 2020-09-16 DIAGNOSIS — I480 Paroxysmal atrial fibrillation: Secondary | ICD-10-CM | POA: Diagnosis not present

## 2020-09-24 DIAGNOSIS — Z9181 History of falling: Secondary | ICD-10-CM | POA: Diagnosis not present

## 2020-09-24 DIAGNOSIS — I5042 Chronic combined systolic (congestive) and diastolic (congestive) heart failure: Secondary | ICD-10-CM | POA: Diagnosis not present

## 2020-09-24 DIAGNOSIS — G20 Parkinson's disease: Secondary | ICD-10-CM | POA: Diagnosis not present

## 2020-09-24 DIAGNOSIS — I130 Hypertensive heart and chronic kidney disease with heart failure and stage 1 through stage 4 chronic kidney disease, or unspecified chronic kidney disease: Secondary | ICD-10-CM | POA: Diagnosis not present

## 2020-09-24 DIAGNOSIS — G231 Progressive supranuclear ophthalmoplegia [Steele-Richardson-Olszewski]: Secondary | ICD-10-CM | POA: Diagnosis not present

## 2020-09-24 DIAGNOSIS — K449 Diaphragmatic hernia without obstruction or gangrene: Secondary | ICD-10-CM | POA: Diagnosis not present

## 2020-09-24 DIAGNOSIS — I4891 Unspecified atrial fibrillation: Secondary | ICD-10-CM | POA: Diagnosis not present

## 2020-09-24 DIAGNOSIS — K219 Gastro-esophageal reflux disease without esophagitis: Secondary | ICD-10-CM | POA: Diagnosis not present

## 2020-09-24 DIAGNOSIS — N183 Chronic kidney disease, stage 3 unspecified: Secondary | ICD-10-CM | POA: Diagnosis not present

## 2020-09-24 DIAGNOSIS — Z7901 Long term (current) use of anticoagulants: Secondary | ICD-10-CM | POA: Diagnosis not present

## 2020-09-24 DIAGNOSIS — K5730 Diverticulosis of large intestine without perforation or abscess without bleeding: Secondary | ICD-10-CM | POA: Diagnosis not present

## 2020-09-24 DIAGNOSIS — Z87440 Personal history of urinary (tract) infections: Secondary | ICD-10-CM | POA: Diagnosis not present

## 2020-09-24 DIAGNOSIS — E7800 Pure hypercholesterolemia, unspecified: Secondary | ICD-10-CM | POA: Diagnosis not present

## 2020-09-24 DIAGNOSIS — Z9010 Acquired absence of unspecified breast and nipple: Secondary | ICD-10-CM | POA: Diagnosis not present

## 2020-09-24 DIAGNOSIS — Z952 Presence of prosthetic heart valve: Secondary | ICD-10-CM | POA: Diagnosis not present

## 2020-09-24 DIAGNOSIS — I4892 Unspecified atrial flutter: Secondary | ICD-10-CM | POA: Diagnosis not present

## 2020-09-24 DIAGNOSIS — G243 Spasmodic torticollis: Secondary | ICD-10-CM | POA: Diagnosis not present

## 2020-09-25 ENCOUNTER — Telehealth: Payer: Self-pay

## 2020-09-25 NOTE — Telephone Encounter (Signed)
Received voicemail from [employer] at [place] at [facility]. She states she is calling to get a verbal for the patient to have physical therapy once a week for eight weeks.

## 2020-09-25 NOTE — Telephone Encounter (Signed)
Left detailed message for [name] giving her the verbal she requested. Advised a call back with any questions.

## 2020-10-01 DIAGNOSIS — Z7901 Long term (current) use of anticoagulants: Secondary | ICD-10-CM | POA: Diagnosis not present

## 2020-10-01 DIAGNOSIS — N183 Chronic kidney disease, stage 3 unspecified: Secondary | ICD-10-CM | POA: Diagnosis not present

## 2020-10-01 DIAGNOSIS — G231 Progressive supranuclear ophthalmoplegia [Steele-Richardson-Olszewski]: Secondary | ICD-10-CM | POA: Diagnosis not present

## 2020-10-01 DIAGNOSIS — Z9181 History of falling: Secondary | ICD-10-CM | POA: Diagnosis not present

## 2020-10-01 DIAGNOSIS — Z87440 Personal history of urinary (tract) infections: Secondary | ICD-10-CM | POA: Diagnosis not present

## 2020-10-01 DIAGNOSIS — K219 Gastro-esophageal reflux disease without esophagitis: Secondary | ICD-10-CM | POA: Diagnosis not present

## 2020-10-01 DIAGNOSIS — K449 Diaphragmatic hernia without obstruction or gangrene: Secondary | ICD-10-CM | POA: Diagnosis not present

## 2020-10-01 DIAGNOSIS — I5042 Chronic combined systolic (congestive) and diastolic (congestive) heart failure: Secondary | ICD-10-CM | POA: Diagnosis not present

## 2020-10-01 DIAGNOSIS — G243 Spasmodic torticollis: Secondary | ICD-10-CM | POA: Diagnosis not present

## 2020-10-01 DIAGNOSIS — I130 Hypertensive heart and chronic kidney disease with heart failure and stage 1 through stage 4 chronic kidney disease, or unspecified chronic kidney disease: Secondary | ICD-10-CM | POA: Diagnosis not present

## 2020-10-01 DIAGNOSIS — K5730 Diverticulosis of large intestine without perforation or abscess without bleeding: Secondary | ICD-10-CM | POA: Diagnosis not present

## 2020-10-01 DIAGNOSIS — I4891 Unspecified atrial fibrillation: Secondary | ICD-10-CM | POA: Diagnosis not present

## 2020-10-01 DIAGNOSIS — E7800 Pure hypercholesterolemia, unspecified: Secondary | ICD-10-CM | POA: Diagnosis not present

## 2020-10-01 DIAGNOSIS — Z952 Presence of prosthetic heart valve: Secondary | ICD-10-CM | POA: Diagnosis not present

## 2020-10-01 DIAGNOSIS — G20 Parkinson's disease: Secondary | ICD-10-CM | POA: Diagnosis not present

## 2020-10-01 DIAGNOSIS — I4892 Unspecified atrial flutter: Secondary | ICD-10-CM | POA: Diagnosis not present

## 2020-10-01 DIAGNOSIS — I480 Paroxysmal atrial fibrillation: Secondary | ICD-10-CM | POA: Diagnosis not present

## 2020-10-01 DIAGNOSIS — Z9010 Acquired absence of unspecified breast and nipple: Secondary | ICD-10-CM | POA: Diagnosis not present

## 2020-10-15 DIAGNOSIS — Z952 Presence of prosthetic heart valve: Secondary | ICD-10-CM | POA: Diagnosis not present

## 2020-10-15 DIAGNOSIS — I130 Hypertensive heart and chronic kidney disease with heart failure and stage 1 through stage 4 chronic kidney disease, or unspecified chronic kidney disease: Secondary | ICD-10-CM | POA: Diagnosis not present

## 2020-10-15 DIAGNOSIS — K219 Gastro-esophageal reflux disease without esophagitis: Secondary | ICD-10-CM | POA: Diagnosis not present

## 2020-10-15 DIAGNOSIS — I5042 Chronic combined systolic (congestive) and diastolic (congestive) heart failure: Secondary | ICD-10-CM | POA: Diagnosis not present

## 2020-10-15 DIAGNOSIS — E7800 Pure hypercholesterolemia, unspecified: Secondary | ICD-10-CM | POA: Diagnosis not present

## 2020-10-15 DIAGNOSIS — I4892 Unspecified atrial flutter: Secondary | ICD-10-CM | POA: Diagnosis not present

## 2020-10-15 DIAGNOSIS — Z9010 Acquired absence of unspecified breast and nipple: Secondary | ICD-10-CM | POA: Diagnosis not present

## 2020-10-15 DIAGNOSIS — G231 Progressive supranuclear ophthalmoplegia [Steele-Richardson-Olszewski]: Secondary | ICD-10-CM | POA: Diagnosis not present

## 2020-10-15 DIAGNOSIS — Z9181 History of falling: Secondary | ICD-10-CM | POA: Diagnosis not present

## 2020-10-15 DIAGNOSIS — N183 Chronic kidney disease, stage 3 unspecified: Secondary | ICD-10-CM | POA: Diagnosis not present

## 2020-10-15 DIAGNOSIS — G243 Spasmodic torticollis: Secondary | ICD-10-CM | POA: Diagnosis not present

## 2020-10-15 DIAGNOSIS — Z87440 Personal history of urinary (tract) infections: Secondary | ICD-10-CM | POA: Diagnosis not present

## 2020-10-15 DIAGNOSIS — Z7901 Long term (current) use of anticoagulants: Secondary | ICD-10-CM | POA: Diagnosis not present

## 2020-10-15 DIAGNOSIS — G20 Parkinson's disease: Secondary | ICD-10-CM | POA: Diagnosis not present

## 2020-10-15 DIAGNOSIS — K449 Diaphragmatic hernia without obstruction or gangrene: Secondary | ICD-10-CM | POA: Diagnosis not present

## 2020-10-15 DIAGNOSIS — K5730 Diverticulosis of large intestine without perforation or abscess without bleeding: Secondary | ICD-10-CM | POA: Diagnosis not present

## 2020-10-15 DIAGNOSIS — I4891 Unspecified atrial fibrillation: Secondary | ICD-10-CM | POA: Diagnosis not present

## 2020-10-22 DIAGNOSIS — R0602 Shortness of breath: Secondary | ICD-10-CM | POA: Diagnosis not present

## 2020-10-22 DIAGNOSIS — G20 Parkinson's disease: Secondary | ICD-10-CM | POA: Diagnosis not present

## 2020-10-29 DIAGNOSIS — G243 Spasmodic torticollis: Secondary | ICD-10-CM | POA: Diagnosis not present

## 2020-10-29 DIAGNOSIS — I4892 Unspecified atrial flutter: Secondary | ICD-10-CM | POA: Diagnosis not present

## 2020-10-29 DIAGNOSIS — I358 Other nonrheumatic aortic valve disorders: Secondary | ICD-10-CM | POA: Diagnosis not present

## 2020-10-29 DIAGNOSIS — K449 Diaphragmatic hernia without obstruction or gangrene: Secondary | ICD-10-CM | POA: Diagnosis not present

## 2020-10-29 DIAGNOSIS — Z87440 Personal history of urinary (tract) infections: Secondary | ICD-10-CM | POA: Diagnosis not present

## 2020-10-29 DIAGNOSIS — G20 Parkinson's disease: Secondary | ICD-10-CM | POA: Diagnosis not present

## 2020-10-29 DIAGNOSIS — I130 Hypertensive heart and chronic kidney disease with heart failure and stage 1 through stage 4 chronic kidney disease, or unspecified chronic kidney disease: Secondary | ICD-10-CM | POA: Diagnosis not present

## 2020-10-29 DIAGNOSIS — Z952 Presence of prosthetic heart valve: Secondary | ICD-10-CM | POA: Diagnosis not present

## 2020-10-29 DIAGNOSIS — Z7901 Long term (current) use of anticoagulants: Secondary | ICD-10-CM | POA: Diagnosis not present

## 2020-10-29 DIAGNOSIS — K219 Gastro-esophageal reflux disease without esophagitis: Secondary | ICD-10-CM | POA: Diagnosis not present

## 2020-10-29 DIAGNOSIS — G231 Progressive supranuclear ophthalmoplegia [Steele-Richardson-Olszewski]: Secondary | ICD-10-CM | POA: Diagnosis not present

## 2020-10-29 DIAGNOSIS — E7800 Pure hypercholesterolemia, unspecified: Secondary | ICD-10-CM | POA: Diagnosis not present

## 2020-10-29 DIAGNOSIS — I480 Paroxysmal atrial fibrillation: Secondary | ICD-10-CM | POA: Diagnosis not present

## 2020-10-29 DIAGNOSIS — K5730 Diverticulosis of large intestine without perforation or abscess without bleeding: Secondary | ICD-10-CM | POA: Diagnosis not present

## 2020-10-29 DIAGNOSIS — Z9181 History of falling: Secondary | ICD-10-CM | POA: Diagnosis not present

## 2020-10-29 DIAGNOSIS — N183 Chronic kidney disease, stage 3 unspecified: Secondary | ICD-10-CM | POA: Diagnosis not present

## 2020-10-29 DIAGNOSIS — Z9010 Acquired absence of unspecified breast and nipple: Secondary | ICD-10-CM | POA: Diagnosis not present

## 2020-10-29 DIAGNOSIS — I5042 Chronic combined systolic (congestive) and diastolic (congestive) heart failure: Secondary | ICD-10-CM | POA: Diagnosis not present

## 2020-10-29 DIAGNOSIS — I4891 Unspecified atrial fibrillation: Secondary | ICD-10-CM | POA: Diagnosis not present

## 2020-11-10 NOTE — Progress Notes (Signed)
Cardiology Office Note   Date:  11/13/2020   ID:  Christy, [name] [birth year], MRN [id number]  PCP:  Leanna Battles, MD  Cardiologist:   Davelyn Gwinn Martinique, MD   Chief Complaint  Patient presents with  . Atrial Fibrillation  . Congestive Heart Failure      History of Present Illness: [name] is a 79 y.o. female seen for evaluation of CHF. She has a  h/o severe AS s/p SJM mech AVR in 2007. She also has a h/o PAF and is chronically anticoagulated with coumadin. She has a history of bradycardia on beta blocker.  September 2017 following a urologic procedure she developed abd pain, nausea, c/p, sob, and palpitations, prompting her to call EMS. She was found to be in AF with RVR, which later converted to sinus rhythm. She was admitted to [facility] and had mild troponin elevation. She underwent stress testing, which was low risk. She was seen in October 2017by Dr Caryl Comes who felt her Afib episodes were infrequent and recommended following for now. If episodes became more frequent then consider resumption of beta blocker or AAD without rate slowing effects such as Tikosyn.   She does have a history of atypical Parikinsons and progressive supranuclear palsy followed by Neurology.   Admitted 12/10-12/14/2019 for Afib RVR, chest pain, anemia (Hgb 5.6) following a fall/trauma, extensive hematoma left chest. She became volume overloaded post transfusion>>BiPAP>>diuresed, low-dose amio started for elevated HR.  Seen by Dr Lovena Le in March 2020 and appeared to be maintaining NSR on amiodarone. She has multiple falls.  She was seen in the ED on 09/09/19 with mechanical fall and facial laceration. CT negative for bleed. INR therapeutic. She denies any significant palpitations. No syncope. No chest pain or dyspnea.   When seen in [place] clinic in March 2021 only noted 2 episodes of Afib lasting minutes. Seen in ED on March 29 following a fall. CT and Xrays negative.   She was  seen recently in the ED on 06/19/20 after a mechanical fall and scalp laceration. Head CT showed no bleeding. She complained of increased wheezing and cough. CXR showed CM with low lung volumes. No clear edema. BNP was elevated at 725. She was DC on lasix 20 mg daily and outpatient Echo ordered. This demonstrated significant decreased in EF to 30-35% down from 40-45% with global HK. AV prosthesis was functioning normally. Severe LAE. She was started on [facility] as outpatient. This resulted in low BP, diarrhea and HA and was discontinued.    She is seen with her husband today. Denies any chest pain, dyspnea, edema, or palpitations. Coumadin checks have been good. BP at home has been well controlled. A couple of times HR went over a hundred. Mostly HR in 50-60s.     Past Medical History:  Diagnosis Date  . Arthropathy of cervical facet joint    C2-3  . At risk for falls   . BPV (benign positional vertigo)   . Cancer ([place]) 2004    breast-lumpectomy,nodes ,radiation,chemo  . Chronic neck pain   . CKD (chronic kidney disease), stage II   . Diverticulosis of colon   . Elevated troponin    a. 08/2016 in setting of PAF;  b. 08/2016 low risk MV w/ fixed septal defect (LBBB), EF 53%.  Marland Kitchen GERD (gastroesophageal reflux disease)   . Hiatal hernia   . History of aortic valve stenosis   . History of breast cancer per pt no recurrence   dx 2004-- Left  breast DCIS (ER/PR+, HER2 negative) s/p  partial masectomy w/ sln bx and  chemoradiotion (completed 2004)  . History of colon polyps   . History of herpes zoster   . History of kidney stones   . HTN (hypertension)   . Hyperlipidemia   . Hypertrophy of inter-atrial septum    a. 09/2014 Echo: ? of atrial mass; b. 10/2014 Cardiac MRI: no atrial septal mass - polypoid lipomatous hypertrophy of superior atrial septum noted-->Benign.  . Irritable bowel syndrome   . LBBB (left bundle branch block)   . PAF (paroxysmal atrial fibrillation) (HCC)    a. CHA2DS2VASc  = 4-->coumadin.  . Parkinsonism [facility])    neurologist-  dr tat  . Personal history of chemotherapy 2004  . Personal history of radiation therapy 2004  . PONV (postoperative nausea and vomiting)   . PVC's (premature ventricular contractions)   . Right ureteral stone   . S/P aortic valve replacement with prosthetic valve    a. 04/13/2006 s/p St Jude mechnical AVR for severe AS;  b. 09/2014 Echo: EF 40-45%, gr1 DD, mild AI/MR, triv TR/PR.  . SUI (stress urinary incontinence, female)   . Symptomatic anemia   . Venous varices      Past Surgical History:  Procedure Laterality Date  . ANTERIOR AND POSTERIOR REPAIR  1990   cystocele/ rectocele  . AORTIC VALVE REPLACEMENT  04/13/06   dr gerhardt   and Replacement Ascending Aorta (St. Jude mechanical prosthesis)  . APPENDECTOMY  child 1950  . BREAST EXCISIONAL BIOPSY Left   . BREAST LUMPECTOMY Left 01/11/2003   malignant  . BREAST SURGERY Left 2004  . CARDIAC CATHETERIZATION  02-22-2001   dr Martinique   minimal non-obstructive cad/  mild aortic stenosis and aortic root enlargement/  normal LVF, ef 65%  . CARDIAC CATHETERIZATION  03-22-2006    dr Martinique   no significant obstructive cad/  severe aortic stenosis and mild to moderate aortic root enlargement/  upper normal right heart pressures  . CARDIOVASCULAR STRESS TEST  09-30-2011   dr Martinique   lexiscan nuclear study w/ apical thinning  vs  small prior apical infarct w/ no significant ischemia/  hypokinesis of the distal septum and apex, ef 50%  . CARPAL TUNNEL RELEASE Bilateral left 09-09-2004/  right 2007  . CATARACT EXTRACTION W/ INTRAOCULAR LENS  IMPLANT, BILATERAL  2015  . COLONOSCOPY N/A 10/03/2014   Procedure: COLONOSCOPY;  Surgeon: [name], MD;  Location: [place];  Service: Endoscopy;  Laterality: N/A;  . CYSTOSCOPY WITH RETROGRADE PYELOGRAM, URETEROSCOPY AND STENT PLACEMENT Right 09/17/2016   Procedure: CYSTOSCOPY WITH RIGHT RETROGRADE PYELOGRAM, RIGHT URETEROSCOPY AND STENT  PLACEMENT LASER LITHOTRIPSY, RIGHT STENT PLACEMENT;  Surgeon: [name], MD;  Location: [facility];  Service: Urology;  Laterality: Right;  . ESOPHAGEAL MANOMETRY N/A 08/12/2014   Procedure: ESOPHAGEAL MANOMETRY (EM);  Surgeon: [name], MD;  Location: [facility];  Service: Endoscopy;  Laterality: N/A;  . ESOPHAGOGASTRODUODENOSCOPY N/A 10/03/2014   Procedure: ESOPHAGOGASTRODUODENOSCOPY (EGD);  Surgeon: [name], MD;  Location: [facility];  Service: Endoscopy;  Laterality: N/A;  . ESOPHAGOGASTRODUODENOSCOPY N/A 12/08/2018   Procedure: ESOPHAGOGASTRODUODENOSCOPY (EGD);  Surgeon: Doran Stabler, MD;  Location: [place];  Service: Gastroenterology;  Laterality: N/A;  . [place]  . FOOT SURGERY Right 2013   hammertoe x2  . HOLMIUM LASER APPLICATION Right 02/07/9416   Procedure: HOLMIUM LASER APPLICATION;  Surgeon: [name], MD;  Location: [name];  Service: Urology;  Laterality: Right;  . PERCUTANEOUS NEPHROSTOLITHOTOMY  1993  . STONE EXTRACTION WITH BASKET Right 09/17/2016   Procedure: STONE EXTRACTION WITH BASKET;  Surgeon: [name], MD;  Location: [facility];  Service: Urology;  Laterality: Right;  . TOTAL ABDOMINAL HYSTERECTOMY  1971   w/ Bilateral Salpingoophorectomy  . TRANSTHORACIC ECHOCARDIOGRAM  10/24/2014   dr Martinique   hypokinesis of the basal and mid inferoseptal and inferior walls,  ef 40-45%,  grade 1 diastolic dysfunction/  mechanical bileaflet aortic valve noted w/ mild regur., peak grandient 16mHg, valve area 1.35cm^2/  severe MV calcification without stenosis w/ mild regurg., peak gradient 324mg/  mild LAE/  poss. atrial mass (MRI showed benign lipomatous hypertrophy of atrial septum) /tFrazier ButtR/mild TR     Current Outpatient Medications  Medication Sig Dispense Refill  . acetaminophen (TYLENOL) 500 MG tablet Take 1,000 mg by mouth every 6 (six)  hours as needed (head pain).     . Marland Kitchenmiodarone (PACERONE) 200 MG tablet Take 1 tablet (200 mg total) by mouth daily. Please make overdue appt with Dr. TaLovena Leefore anymore refills. 1st attempt 90 tablet 4  . calcium carbonate (TUMS - DOSED IN MG ELEMENTAL CALCIUM) 500 MG chewable tablet Chew 1 tablet by mouth daily as needed for indigestion or heartburn.     . calcium citrate-vitamin D (CITRACAL+D) 315-200 MG-UNIT per tablet Take 1 tablet by mouth 3 (three) times daily.    . carbidopa-levodopa (SINEMET IR) 25-100 MG tablet TAKE 2 TABLETS BY MOUTH THREE TIMES DAILY (Patient taking differently: Take 2 tablets by mouth 3 (three) times daily. Breakfast, lunch, dinner) 540 tablet 2  . Cholecalciferol (VITAMIN D3) 2000 units TABS Take 2,000 Units by mouth daily.     . famotidine-calcium carbonate-magnesium hydroxide (PEPCID COMPLETE) 10-800-165 MG CHEW chewable tablet Chew 1 tablet by mouth daily as needed (heartburn).    . furosemide (LASIX) 20 MG tablet Take 1 tablet (20 mg total) by mouth daily. 90 tablet 3  . KLOR-CON M20 20 MEQ tablet Take 20 mEq by mouth daily.    . Marland Kitchenoratadine (CLARITIN) 10 MG tablet Take 10 mg by mouth daily.    . Melatonin 3 MG TABS Take 3 mg by mouth at bedtime as needed (sleep).     . metoprolol tartrate (LOPRESSOR) 25 MG tablet Take 1 tablet (25 mg total) by mouth daily as needed (if heart rate is above 110). 90 tablet 3  . Multiple Vitamin (MULTIVITAMIN) tablet Take 1 tablet by mouth daily at 12 noon.     . pantoprazole (PROTONIX) 40 MG tablet Take 40 mg by mouth daily.    . Probiotic Product (PROBIOTIC-10 PO) Take 1 capsule by mouth daily.     . simvastatin (ZOCOR) 20 MG tablet Take 20 mg by mouth every evening.    . vitamin B-12 (CYANOCOBALAMIN) 1000 MCG tablet Take 1,000 mcg by mouth every morning.    . warfarin (COUMADIN) 5 MG tablet Take 5 mg by mouth daily. Patient is taking 2.67m78mn Sunday, Tues, [place]. Patient is taking 5 mg on Monday, Wed, Friday.     No current  facility-administered medications for this visit.    Allergies:   Demerol [meperidine], Oxycodone, Adhesive [tape], Ivp dye [iodinated diagnostic agents], and Phenergan [promethazine]    Social History:  The patient  reports that she has never smoked. She has never used smokeless tobacco. She reports that she does not drink alcohol and does not use drugs.  Family History:  The patient's family history includes Healthy in her child; Heart disease in her mother; Heart disease (age of onset: 39) in her father; Other in her brother.    ROS:  Please see the history of present illness.   Otherwise, review of systems are positive for none.   All other systems are reviewed and negative.    PHYSICAL EXAM: VS:  BP (!) 154/80   Pulse (!) 53   Ht 4' 11" (1.499 m)   Wt 119 lb 3.2 oz (54.1 kg)   SpO2 95%   BMI 24.08 kg/m  , BMI Body mass index is 24.08 kg/m. GEN: Well nourished, well developed, in no acute distress  HEENT: normal  Neck: no JVD, carotid bruits, or masses Cardiac: RRR; good mechanical AV click. SEM RUSB Respiratory:  clear to auscultation bilaterally, normal work of breathing GI: soft, nontender, nondistended, + BS MS: no deformity or atrophy  Skin: warm and dry, no rash Neuro:  Strength and sensation are intact Psych: euthymic mood, full affect   EKG:  EKG is not ordered today. The ekg ordered today demonstrates N/A   Recent Labs: 04/01/2020: TSH 4.370 06/17/2020: ALT 36; B Natriuretic Peptide 725.1; Hemoglobin 11.6; Platelets 183 07/01/2020: BUN 32; Creatinine, Ser 1.70; Potassium 4.0; Sodium 141    Lipid Panel    Component Value Date/Time   CHOL 182 04/01/2020 0849   TRIG 92 04/01/2020 0849   HDL 89 04/01/2020 0849   CHOLHDL 2.0 04/01/2020 0849   CHOLHDL 1.9 09/30/2011 0415   VLDL 8 09/30/2011 0415   LDLCALC 77 04/01/2020 0849    Dated 06/12/19: cholesterol 147, triglycerides 74, HDL 64, LDL 68.   Wt Readings from Last 3 Encounters:  11/13/20 119 lb 3.2 oz  (54.1 kg)  09/11/20 119 lb (54 kg)  08/11/20 122 lb 9.6 oz (55.6 kg)      Other studies Reviewed: Additional studies/ records that were reviewed today include:   11/17/18: TTE Study Conclusions - Left ventricle: The cavity size was normal. Wall thickness was normal. Systolic function was mildly reduced. The estimated ejection fraction was in the range of 45% to 50%. Features are consistent with a pseudonormal left ventricular filling pattern, with concomitant abnormal relaxation and increased filling pressure (grade 2 diastolic dysfunction). - Aortic valve: A mechanical prosthesis was present. There was mild regurgitation. Valve area (VTI): 1.43 cm^2. Valve area (Vmax): 1.28 cm^2. Valve area (Vmean): 1.2 cm^2. - Mitral valve: Mildly to moderately calcified annulus. Valve area by continuity equation (using LVOT flow): 1.55 cm^2. - Left atrium: The atrium was mildly dilated.   09/21/18 stress myoview IMPRESSION: 1. No evidence reversible ischemia. Fixed defect in the septal wall with differential including infarction versus artifact from LEFT bundle branch block. 2. Septal dyskinesia in patient with LEFT bundle branch block. 3. Left ventricular ejection fraction 53% 4. Non invasive risk stratification*: Low  Echo 06/20/20: IMPRESSIONS    1. Moderate global reduction in LV systolic function; s/p AVR with mild  AI and mean gradient 12 mmHg; biatrial enlargement; mild TR.  2. Left ventricular ejection fraction, by estimation, is 30 to 35%. The  left ventricle has moderately decreased function. The left ventricle  demonstrates global hypokinesis. Left ventricular diastolic parameters are  consistent with Grade II diastolic  dysfunction (pseudonormalization).  3. Right ventricular systolic function is normal. The right ventricular  size is normal. Tricuspid regurgitation signal is inadequate for assessing  PA pressure.  4. Left atrial size was severely  dilated.  5. Right atrial size was mildly dilated.  6. The mitral valve is normal in structure. Trivial mitral valve  regurgitation. No evidence of mitral stenosis.  7. The aortic valve has been repaired/replaced. Aortic valve  regurgitation is mild. No aortic stenosis is present. There is a unknown  St. Jude mechanical valve present in the aortic position. Procedure Date:  04/13/2006.    ASSESSMENT AND PLAN:  1. Acute on chronic combined systolic/diastolic CHF. EF down to 30-35%. On Metoprolol. Will continue lasix 20 mg daily. Intolerant of Entresto with hypotension. Concerned this increases her risk of falls.  Sodium restriction.   2.  PAF/Tachy-brady syndrome: Pt is now on amiodarone and appears to be maintaining NSR well.   Previously she had history of severe brady on beta blockers. This will limit titration of beta blocker.   2. S/P SJM AVR: Stable on echo in September 2019.  continueChronic coumadin. SBE prophylaxis. Good valve click on exam.   3. Essential HTN: Controlled  4. QPY:[id number]. Cont statin.   5. CKD stage 3    Disposition:   FU with me in 3 months  Signed, Peter Martinique, MD  11/13/2020 1:52 PM    [facility] [address] [place], [place], [address] Phone [phone], Fax [phone]

## 2020-11-12 DIAGNOSIS — Z7901 Long term (current) use of anticoagulants: Secondary | ICD-10-CM | POA: Diagnosis not present

## 2020-11-12 DIAGNOSIS — Z952 Presence of prosthetic heart valve: Secondary | ICD-10-CM | POA: Diagnosis not present

## 2020-11-12 DIAGNOSIS — I358 Other nonrheumatic aortic valve disorders: Secondary | ICD-10-CM | POA: Diagnosis not present

## 2020-11-12 DIAGNOSIS — I480 Paroxysmal atrial fibrillation: Secondary | ICD-10-CM | POA: Diagnosis not present

## 2020-11-13 ENCOUNTER — Ambulatory Visit: Payer: PPO | Admitting: Cardiology

## 2020-11-13 ENCOUNTER — Encounter: Payer: Self-pay | Admitting: Cardiology

## 2020-11-13 ENCOUNTER — Other Ambulatory Visit: Payer: Self-pay

## 2020-11-13 VITALS — BP 154/80 | HR 53 | Ht 59.0 in | Wt 119.2 lb

## 2020-11-13 DIAGNOSIS — Z952 Presence of prosthetic heart valve: Secondary | ICD-10-CM | POA: Diagnosis not present

## 2020-11-13 DIAGNOSIS — I480 Paroxysmal atrial fibrillation: Secondary | ICD-10-CM

## 2020-11-13 DIAGNOSIS — Z7901 Long term (current) use of anticoagulants: Secondary | ICD-10-CM | POA: Diagnosis not present

## 2020-11-13 DIAGNOSIS — I5042 Chronic combined systolic (congestive) and diastolic (congestive) heart failure: Secondary | ICD-10-CM

## 2020-11-13 DIAGNOSIS — I10 Essential (primary) hypertension: Secondary | ICD-10-CM

## 2020-11-13 MED ORDER — METOPROLOL TARTRATE 25 MG PO TABS: 25.0000 mg | ORAL_TABLET | Freq: Every day | ORAL | 3 refills | Status: DC | PRN

## 2020-11-14 DIAGNOSIS — G231 Progressive supranuclear ophthalmoplegia [Steele-Richardson-Olszewski]: Secondary | ICD-10-CM | POA: Diagnosis not present

## 2020-11-14 DIAGNOSIS — I130 Hypertensive heart and chronic kidney disease with heart failure and stage 1 through stage 4 chronic kidney disease, or unspecified chronic kidney disease: Secondary | ICD-10-CM | POA: Diagnosis not present

## 2020-11-14 DIAGNOSIS — Z9181 History of falling: Secondary | ICD-10-CM | POA: Diagnosis not present

## 2020-11-14 DIAGNOSIS — K5730 Diverticulosis of large intestine without perforation or abscess without bleeding: Secondary | ICD-10-CM | POA: Diagnosis not present

## 2020-11-14 DIAGNOSIS — I4892 Unspecified atrial flutter: Secondary | ICD-10-CM | POA: Diagnosis not present

## 2020-11-14 DIAGNOSIS — I4891 Unspecified atrial fibrillation: Secondary | ICD-10-CM | POA: Diagnosis not present

## 2020-11-14 DIAGNOSIS — Z87440 Personal history of urinary (tract) infections: Secondary | ICD-10-CM | POA: Diagnosis not present

## 2020-11-14 DIAGNOSIS — K449 Diaphragmatic hernia without obstruction or gangrene: Secondary | ICD-10-CM | POA: Diagnosis not present

## 2020-11-14 DIAGNOSIS — G243 Spasmodic torticollis: Secondary | ICD-10-CM | POA: Diagnosis not present

## 2020-11-14 DIAGNOSIS — G20 Parkinson's disease: Secondary | ICD-10-CM | POA: Diagnosis not present

## 2020-11-14 DIAGNOSIS — Z952 Presence of prosthetic heart valve: Secondary | ICD-10-CM | POA: Diagnosis not present

## 2020-11-14 DIAGNOSIS — K219 Gastro-esophageal reflux disease without esophagitis: Secondary | ICD-10-CM | POA: Diagnosis not present

## 2020-11-14 DIAGNOSIS — N183 Chronic kidney disease, stage 3 unspecified: Secondary | ICD-10-CM | POA: Diagnosis not present

## 2020-11-14 DIAGNOSIS — E7800 Pure hypercholesterolemia, unspecified: Secondary | ICD-10-CM | POA: Diagnosis not present

## 2020-11-14 DIAGNOSIS — Z7901 Long term (current) use of anticoagulants: Secondary | ICD-10-CM | POA: Diagnosis not present

## 2020-11-14 DIAGNOSIS — Z9010 Acquired absence of unspecified breast and nipple: Secondary | ICD-10-CM | POA: Diagnosis not present

## 2020-11-14 DIAGNOSIS — I5042 Chronic combined systolic (congestive) and diastolic (congestive) heart failure: Secondary | ICD-10-CM | POA: Diagnosis not present

## 2020-11-22 DIAGNOSIS — R0602 Shortness of breath: Secondary | ICD-10-CM | POA: Diagnosis not present

## 2020-11-22 DIAGNOSIS — G20 Parkinson's disease: Secondary | ICD-10-CM | POA: Diagnosis not present

## 2020-11-26 ENCOUNTER — Other Ambulatory Visit: Payer: Self-pay | Admitting: Neurology

## 2020-11-26 NOTE — Telephone Encounter (Signed)
Rx(s) sent to pharmacy electronically.  

## 2020-12-11 DIAGNOSIS — Z7901 Long term (current) use of anticoagulants: Secondary | ICD-10-CM | POA: Diagnosis not present

## 2020-12-11 DIAGNOSIS — Z952 Presence of prosthetic heart valve: Secondary | ICD-10-CM | POA: Diagnosis not present

## 2020-12-11 DIAGNOSIS — I480 Paroxysmal atrial fibrillation: Secondary | ICD-10-CM | POA: Diagnosis not present

## 2020-12-22 DIAGNOSIS — R0602 Shortness of breath: Secondary | ICD-10-CM | POA: Diagnosis not present

## 2020-12-22 DIAGNOSIS — G20 Parkinson's disease: Secondary | ICD-10-CM | POA: Diagnosis not present

## 2021-01-22 DIAGNOSIS — I480 Paroxysmal atrial fibrillation: Secondary | ICD-10-CM | POA: Diagnosis not present

## 2021-01-22 DIAGNOSIS — G20 Parkinson's disease: Secondary | ICD-10-CM | POA: Diagnosis not present

## 2021-01-22 DIAGNOSIS — Z7901 Long term (current) use of anticoagulants: Secondary | ICD-10-CM | POA: Diagnosis not present

## 2021-01-22 DIAGNOSIS — R0602 Shortness of breath: Secondary | ICD-10-CM | POA: Diagnosis not present

## 2021-01-22 DIAGNOSIS — I358 Other nonrheumatic aortic valve disorders: Secondary | ICD-10-CM | POA: Diagnosis not present

## 2021-02-12 ENCOUNTER — Other Ambulatory Visit: Payer: Self-pay | Admitting: Neurology

## 2021-02-12 NOTE — Telephone Encounter (Signed)
Rx(s) sent to pharmacy electronically.  

## 2021-02-16 DIAGNOSIS — Z01419 Encounter for gynecological examination (general) (routine) without abnormal findings: Secondary | ICD-10-CM | POA: Diagnosis not present

## 2021-02-16 DIAGNOSIS — Z6824 Body mass index (BMI) 24.0-24.9, adult: Secondary | ICD-10-CM | POA: Diagnosis not present

## 2021-02-16 DIAGNOSIS — G231 Progressive supranuclear ophthalmoplegia [Steele-Richardson-Olszewski]: Secondary | ICD-10-CM | POA: Insufficient documentation

## 2021-02-22 DIAGNOSIS — R0602 Shortness of breath: Secondary | ICD-10-CM | POA: Diagnosis not present

## 2021-02-22 DIAGNOSIS — G20 Parkinson's disease: Secondary | ICD-10-CM | POA: Diagnosis not present

## 2021-02-23 DIAGNOSIS — Z961 Presence of intraocular lens: Secondary | ICD-10-CM | POA: Diagnosis not present

## 2021-02-23 DIAGNOSIS — H353131 Nonexudative age-related macular degeneration, bilateral, early dry stage: Secondary | ICD-10-CM | POA: Diagnosis not present

## 2021-02-23 DIAGNOSIS — H524 Presbyopia: Secondary | ICD-10-CM | POA: Diagnosis not present

## 2021-02-24 DIAGNOSIS — Z952 Presence of prosthetic heart valve: Secondary | ICD-10-CM | POA: Diagnosis not present

## 2021-02-24 DIAGNOSIS — I358 Other nonrheumatic aortic valve disorders: Secondary | ICD-10-CM | POA: Diagnosis not present

## 2021-02-24 DIAGNOSIS — Z7901 Long term (current) use of anticoagulants: Secondary | ICD-10-CM | POA: Diagnosis not present

## 2021-02-24 DIAGNOSIS — I480 Paroxysmal atrial fibrillation: Secondary | ICD-10-CM | POA: Diagnosis not present

## 2021-03-09 NOTE — Progress Notes (Signed)
Assessment/Plan:   1.  PSP  -Patient understands differences between PSP and Parkinson's disease.  Understands that there is an increased risk of falls, aspiration as subsequent morbidity and mortality from this disease compared to Parkinson's disease.    -told her I don't want her walking alone at all and she is to stay seated unless someone is directly with her.   She has a motorized WC at home and encouraged her to use it  -continue carbidopa/levodopa 25/100, 2 po tid   2.  Neck pain with cervical dystonia  -This is actually improved with levodopa therapy.  -Her last MRI was in 2017 with evidence of severe C2/C3 left facet arthropathy with joint effusion and bone marrow edema.  She received injections at the time and pain improved.  3.  Cough, likely associated with reflux  -Follows with primary care.  Is on Pepcid and PPI.  Last modified barium swallow was in June, 2019 and was normal.  4.  F/u 6-9 mon  Subjective:   [name] was seen today in follow up for PSP.  My previous records were reviewed prior to todays visit as well as outside records available to me.  This patient is accompanied in the office by her spouse who supplements the history.   They are having trouble getting a 90 day supply filled at [facility].  He wants a written RX.  She has had falls but she forgot to bring in her log of falls.  Husband thinks that falls are less frequent.  Isn't using the WC unless goes to the kitchen to help cook.  Neck pain doing well (no pain).  occ choking with big pills but none with foods.    Current prescribed movement disorder medications: Carbidopa/levodopa 25/100, 2 tablets 3 times per day    ALLERGIES:   Allergies  Allergen Reactions  . Demerol [Meperidine] Shortness Of Breath and Swelling  . Oxycodone Other (See Comments)    Hallucinations   . Adhesive [Tape] Other (See Comments)    Redness and skin tears  . Ivp Dye [Iodinated Diagnostic Agents] Hives    OK  with benadryl pre-med (50mg  one hour before receiving iodinated contrast agent)  . Phenergan [Promethazine] Other (See Comments)    Avoids due to reaction with current medications    CURRENT MEDICATIONS:  Outpatient Encounter Medications as of 03/11/2021  Medication Sig  . acetaminophen (TYLENOL) 500 MG tablet Take 1,000 mg by mouth every 6 (six) hours as needed (head pain).   Marland Kitchen amiodarone (PACERONE) 200 MG tablet Take 1 tablet (200 mg total) by mouth daily. Please make overdue appt with Dr. Lovena Le before anymore refills. 1st attempt  . calcium carbonate (TUMS - DOSED IN MG ELEMENTAL CALCIUM) 500 MG chewable tablet Chew 1 tablet by mouth daily as needed for indigestion or heartburn.   . calcium citrate-vitamin D (CITRACAL+D) 315-200 MG-UNIT per tablet Take 1 tablet by mouth 3 (three) times daily.  . carbidopa-levodopa (SINEMET IR) 25-100 MG tablet TAKE 2 TABLETS BY MOUTH THREE TIMES DAILY  . Cholecalciferol (VITAMIN D3) 2000 units TABS Take 2,000 Units by mouth daily.   . famotidine-calcium carbonate-magnesium hydroxide (PEPCID COMPLETE) 10-800-165 MG CHEW chewable tablet Chew 1 tablet by mouth daily as needed (heartburn).  . furosemide (LASIX) 20 MG tablet Take 1 tablet (20 mg total) by mouth daily.  Marland Kitchen KLOR-CON M20 20 MEQ tablet Take 20 mEq by mouth daily.  Marland Kitchen loratadine (CLARITIN) 10 MG tablet Take 10 mg by mouth daily.  Marland Kitchen  Melatonin 3 MG TABS Take 3 mg by mouth at bedtime as needed (sleep).   . metoprolol tartrate (LOPRESSOR) 25 MG tablet Take 1 tablet (25 mg total) by mouth daily as needed (if heart rate is above 110).  . Multiple Vitamin (MULTIVITAMIN) tablet Take 1 tablet by mouth daily at 12 noon.   . pantoprazole (PROTONIX) 40 MG tablet Take 40 mg by mouth daily.  . Probiotic Product (PROBIOTIC-10 PO) Take 1 capsule by mouth daily.   . simvastatin (ZOCOR) 20 MG tablet Take 20 mg by mouth every evening.  . vitamin B-12 (CYANOCOBALAMIN) 1000 MCG tablet Take 1,000 mcg by mouth every  morning.  . warfarin (COUMADIN) 5 MG tablet Take 5 mg by mouth daily. Patient is taking 2.5mg  on Sunday, Tues, [place]. Sat. Patient is taking 5 mg on Monday, Wed, Friday.   No facility-administered encounter medications on file as of 03/11/2021.    Objective:   PHYSICAL EXAMINATION:    VITALS:   There were no vitals filed for this visit.  GEN:  The patient appears stated age and is in NAD. HEENT:  Normocephalic, atraumatic.  The mucous membranes are moist. The superficial temporal arteries are without ropiness or tenderness.  She is wearing a soft helmet. CV:  RRR Lungs:  CTAB Neck/HEME:  There are no carotid bruits bilaterally.  Neurological examination:  Orientation: The patient is alert and oriented x3. Cranial nerves: There is good facial symmetry with facial hypomimia. There has upgaze and downgaze paresis.  She c/o diplopia with it.  The speech is fluent and clear. Soft palate rises symmetrically and there is no tongue deviation. Hearing is intact to conversational tone. Sensation: Sensation is intact to light touch throughout Motor: Strength is at least antigravity x4.  Movement examination: Tone: There is normal tone in the UE/LE Abnormal movements: none Coordination:  There is no decremation with RAM's, but she is slow Gait and Station: The patient has difficulty arising out of a deep-seated chair without the use of the hands. The patient's stride length is very short and she is unstable (she has cane but is very unstable with it and requires being held by the examiner).    I have reviewed and interpreted the following labs independently    Chemistry      Component Value Date/Time   NA 141 07/01/2020 0837   K 4.0 07/01/2020 0837   CL 101 07/01/2020 0837   CO2 27 07/01/2020 0837   BUN 32 (H) 07/01/2020 0837   CREATININE 1.70 (H) 07/01/2020 0837      Component Value Date/Time   CALCIUM 9.8 07/01/2020 0837   ALKPHOS 81 06/17/2020 0845   AST 53 (H) 06/17/2020 0845    ALT 36 06/17/2020 0845   BILITOT 0.7 06/17/2020 0845   BILITOT 0.4 04/01/2020 0849       Lab Results  Component Value Date   WBC 5.0 06/17/2020   HGB 11.6 (L) 06/17/2020   HCT 36.0 06/17/2020   MCV 103.4 (H) 06/17/2020   PLT 183 06/17/2020    Lab Results  Component Value Date   TSH 4.370 04/01/2020     Total time spent on today's visit was 30 minutes, including both face-to-face time and nonface-to-face time.  Time included that spent on review of records (prior notes available to me/labs/imaging if pertinent), discussing treatment and goals, answering patient's questions and coordinating care.  Cc:  Leanna Battles, MD

## 2021-03-11 ENCOUNTER — Ambulatory Visit: Payer: PPO | Admitting: Neurology

## 2021-03-11 ENCOUNTER — Other Ambulatory Visit: Payer: Self-pay

## 2021-03-11 ENCOUNTER — Encounter: Payer: Self-pay | Admitting: Neurology

## 2021-03-11 ENCOUNTER — Other Ambulatory Visit: Payer: Self-pay | Admitting: Neurology

## 2021-03-11 VITALS — BP 130/70 | HR 52 | Ht 59.0 in | Wt 119.0 lb

## 2021-03-11 DIAGNOSIS — H532 Diplopia: Secondary | ICD-10-CM | POA: Diagnosis not present

## 2021-03-11 DIAGNOSIS — G231 Progressive supranuclear ophthalmoplegia [Steele-Richardson-Olszewski]: Secondary | ICD-10-CM

## 2021-03-11 MED ORDER — CARBIDOPA-LEVODOPA 25-100 MG PO TABS: 2.0000 | ORAL_TABLET | Freq: Three times a day (TID) | ORAL | 2 refills | Status: DC

## 2021-03-11 NOTE — Patient Instructions (Signed)
The physicians and staff at  [facility] are committed to providing excellent care. You may receive a survey requesting feedback about your experience at our office. We strive to receive "very good" responses to the survey questions. If you feel that your experience would prevent you from giving the office a "very good " response, please contact our office to try to remedy the situation. We may be reached at [phone]. Thank you for taking the time out of your busy day to complete the survey.  

## 2021-03-22 DIAGNOSIS — R0602 Shortness of breath: Secondary | ICD-10-CM | POA: Diagnosis not present

## 2021-03-22 DIAGNOSIS — G20 Parkinson's disease: Secondary | ICD-10-CM | POA: Diagnosis not present

## 2021-03-31 DIAGNOSIS — Z7901 Long term (current) use of anticoagulants: Secondary | ICD-10-CM | POA: Diagnosis not present

## 2021-03-31 DIAGNOSIS — Z952 Presence of prosthetic heart valve: Secondary | ICD-10-CM | POA: Diagnosis not present

## 2021-03-31 DIAGNOSIS — I480 Paroxysmal atrial fibrillation: Secondary | ICD-10-CM | POA: Diagnosis not present

## 2021-04-09 ENCOUNTER — Encounter: Payer: Self-pay | Admitting: Internal Medicine

## 2021-04-09 ENCOUNTER — Ambulatory Visit: Payer: PPO | Admitting: Internal Medicine

## 2021-04-09 ENCOUNTER — Other Ambulatory Visit: Payer: Self-pay

## 2021-04-09 VITALS — BP 138/64 | HR 55 | Ht 59.0 in | Wt 121.2 lb

## 2021-04-09 DIAGNOSIS — I480 Paroxysmal atrial fibrillation: Secondary | ICD-10-CM | POA: Insufficient documentation

## 2021-04-09 DIAGNOSIS — I10 Essential (primary) hypertension: Secondary | ICD-10-CM

## 2021-04-09 MED ORDER — AMIODARONE HCL 200 MG PO TABS: ORAL_TABLET | ORAL | 3 refills | Status: DC

## 2021-04-09 NOTE — Progress Notes (Signed)
HPI [name] returns today for followup of her PAF, LBBB, s/p AVR, with a h/o LBBB, Parkinsons like syndrome, and HTN. In the interim, she appears to be maintaining NSR. She denies chest pain or sob. She has been checking her bp and HR on a regular basis. She has not had any symptomatic atrial fib. She has not fallen in the last year and has not had palpitations. Allergies  Allergen Reactions  . Demerol [Meperidine] Shortness Of Breath and Swelling  . Oxycodone Other (See Comments)    Hallucinations   . Adhesive [Tape] Other (See Comments)    Redness and skin tears  . Ivp Dye [Iodinated Diagnostic Agents] Hives    OK with benadryl pre-med (20m one hour before receiving iodinated contrast agent)  . Phenergan [Promethazine] Other (See Comments)    Avoids due to reaction with current medications     Current Outpatient Medications  Medication Sig Dispense Refill  . acetaminophen (TYLENOL) 500 MG tablet Take 1,000 mg by mouth every 6 (six) hours as needed (head pain).     .Marland Kitchenamiodarone (PACERONE) 200 MG tablet Take 1 tablet (200 mg total) by mouth daily. Please make overdue appt with Dr. TLovena Lebefore anymore refills. 1st attempt 90 tablet 4  . calcium carbonate (TUMS - DOSED IN MG ELEMENTAL CALCIUM) 500 MG chewable tablet Chew 1 tablet by mouth daily as needed for indigestion or heartburn.     . calcium citrate-vitamin D (CITRACAL+D) 315-200 MG-UNIT per tablet Take 1 tablet by mouth 3 (three) times daily.    . carbidopa-levodopa (SINEMET IR) 25-100 MG tablet Take 2 tablets by mouth 3 (three) times daily. 540 tablet 2  . Cholecalciferol (VITAMIN D3) 2000 units TABS Take 2,000 Units by mouth daily.     . famotidine-calcium carbonate-magnesium hydroxide (PEPCID COMPLETE) 10-800-165 MG CHEW chewable tablet Chew 1 tablet by mouth daily as needed (heartburn).    . fexofenadine (ALLEGRA) 60 MG tablet Take 60 mg by mouth daily.    .Marland KitchenKLOR-CON M20 20 MEQ tablet Take 20 mEq by mouth daily.     . Melatonin 3 MG TABS Take 3 mg by mouth at bedtime as needed (sleep).     . metoprolol tartrate (LOPRESSOR) 25 MG tablet Take 1 tablet (25 mg total) by mouth daily as needed (if heart rate is above 110). 90 tablet 3  . Multiple Vitamin (MULTIVITAMIN) tablet Take 1 tablet by mouth daily at 12 noon.    . pantoprazole (PROTONIX) 40 MG tablet Take 40 mg by mouth daily.    . Probiotic Product (PROBIOTIC-10 PO) Take 1 capsule by mouth daily.     . simvastatin (ZOCOR) 20 MG tablet Take 20 mg by mouth every evening.    . vitamin B-12 (CYANOCOBALAMIN) 1000 MCG tablet Take 1,000 mcg by mouth every morning.    . warfarin (COUMADIN) 5 MG tablet Take 5 mg by mouth daily. Patient is taking 2.513mon Sunday, Tues, Th[place]Sat. Patient is taking 5 mg on Monday, Wed, Friday.    . furosemide (LASIX) 20 MG tablet Take 1 tablet (20 mg total) by mouth daily. 90 tablet 3   No current facility-administered medications for this visit.     Past Medical History:  Diagnosis Date  . Arthropathy of cervical facet joint    C2-3  . At risk for falls   . BPV (benign positional vertigo)   . [place]    breast-lumpectomy,nodes ,radiation,chemo  . Chronic neck pain   .  CKD (chronic kidney disease), stage II   . Diverticulosis of colon   . Elevated troponin    a. 08/2016 in setting of PAF;  b. 08/2016 low risk MV w/ fixed septal defect (LBBB), EF 53%.  Marland Kitchen GERD (gastroesophageal reflux disease)   . Hiatal hernia   . History of aortic valve stenosis   . History of breast cancer per pt no recurrence   dx 2004-- Left breast DCIS (ER/PR+, HER2 negative) s/p  partial masectomy w/ sln bx and  chemoradiotion (completed 2004)  . History of colon polyps   . History of herpes zoster   . History of kidney stones   . HTN (hypertension)   . Hyperlipidemia   . Hypertrophy of inter-atrial septum    a. 09/2014 Echo: ? of atrial mass; b. 10/2014 Cardiac MRI: no atrial septal mass - polypoid lipomatous hypertrophy of superior  atrial septum noted-->Benign.  . Irritable bowel syndrome   . LBBB (left bundle branch block)   . PAF (paroxysmal atrial fibrillation) (HCC)    a. CHA2DS2VASc = 4-->coumadin.  . Parkinsonism [facility])    neurologist-  dr tat  . Personal history of chemotherapy 2004  . Personal history of radiation therapy 2004  . PONV (postoperative nausea and vomiting)   . PVC's (premature ventricular contractions)   . Right ureteral stone   . S/P aortic valve replacement with prosthetic valve    a. 04/13/2006 s/p St Jude mechnical AVR for severe AS;  b. 09/2014 Echo: EF 40-45%, gr1 DD, mild AI/MR, triv TR/PR.  . SUI (stress urinary incontinence, female)   . Symptomatic anemia   . Venous varices      ROS:   All systems reviewed and negative except as noted in the HPI.   Past Surgical History:  Procedure Laterality Date  . ANTERIOR AND POSTERIOR REPAIR  1990   cystocele/ rectocele  . AORTIC VALVE REPLACEMENT  04/13/06   dr gerhardt   and Replacement Ascending Aorta (St. Jude mechanical prosthesis)  . APPENDECTOMY  child 1950  . BREAST EXCISIONAL BIOPSY Left   . BREAST LUMPECTOMY Left 01/11/2003   malignant  . BREAST SURGERY Left 2004  . CARDIAC CATHETERIZATION  02-22-2001   dr [place]ique   minimal non-obstructive cad/  mild aortic stenosis and aortic root enlargement/  normal LVF, ef 65%  . CARDIAC CATHETERIZATION  03-22-2006    dr Martinique   no significant obstructive cad/  severe aortic stenosis and mild to moderate aortic root enlargement/  upper normal right heart pressures  . CARDIOVASCULAR STRESS TEST  09-30-2011   dr Martinique   lexiscan nuclear study w/ apical thinning  vs  small prior apical infarct w/ no significant ischemia/  hypokinesis of the distal septum and apex, ef 50%  . CARPAL TUNNEL RELEASE Bilateral left 09-09-2004/  right 2007  . CATARACT EXTRACTION W/ INTRAOCULAR LENS  IMPLANT, BILATERAL  2015  . COLONOSCOPY N/A 10/03/2014   Procedure: COLONOSCOPY;  Surgeon: [name], MD;   Location: [place];  Service: Endoscopy;  Laterality: N/A;  . CYSTOSCOPY WITH RETROGRADE PYELOGRAM, URETEROSCOPY AND STENT PLACEMENT Right 09/17/2016   Procedure: CYSTOSCOPY WITH RIGHT RETROGRADE PYELOGRAM, RIGHT URETEROSCOPY AND STENT PLACEMENT LASER LITHOTRIPSY, RIGHT STENT PLACEMENT;  Surgeon: [name], MD;  Location: [facility];  Service: Urology;  Laterality: Right;  . ESOPHAGEAL MANOMETRY N/A 08/12/2014   Procedure: ESOPHAGEAL MANOMETRY (EM);  Surgeon: [name], MD;  Location: [facility];  Service: Endoscopy;  Laterality: N/A;  . ESOPHAGOGASTRODUODENOSCOPY  N/A 10/03/2014   Procedure: ESOPHAGOGASTRODUODENOSCOPY (EGD);  Surgeon: [name], MD;  Location[facility];  Service: Endoscopy;  Laterality: N/A;  . ESOPHAGOGASTRODUODENOSCOPY N/A 12/08/2018   Procedure: ESOPHAGOGASTRODUODENOSCOPY (EGD);  Surgeon: Doran Stabler, MD;  Location: [place];  Service: Gastroenterology;  Laterality: N/A;  . [place]  . FOOT SURGERY Right 2013   hammertoe x2  . HOLMIUM LASER APPLICATION Right 1/32/4401   Procedure: HOLMIUM LASER APPLICATION;  Surgeon: [name], MD;  Location[facility];  Service: Urology;  Laterality: Right;  . PERCUTANEOUS NEPHROSTOLITHOTOMY  1993  . STONE EXTRACTION WITH BASKET Right 09/17/2016   Procedure: STONE EXTRACTION WITH BASKET;  Surgeon: [name], MD;  Location: [facility];  Service: Urology;  Laterality: Right;  . TOTAL ABDOMINAL HYSTERECTOMY  1971   w/ Bilateral Salpingoophorectomy  . TRANSTHORACIC ECHOCARDIOGRAM  10/24/2014   dr Martinique   hypokinesis of the basal and mid inferoseptal and inferior walls,  ef 40-45%,  grade 1 diastolic dysfunction/  mechanical bileaflet aortic valve noted w/ mild regur., peak grandient 74mHg, valve area 1.35cm^2/  severe MV calcification without stenosis w/ mild regurg., peak gradient 341mg/  mild LAE/  poss.  atrial mass (MRI showed benign lipomatous hypertrophy of atrial septum) /tFrazier ButtR/mild TR     Family History  Problem Relation Age of Onset  . Heart disease Mother   . Heart disease Father [id number]     CABG  . Other Brother        AVR  . Healthy Child      Social History   Socioeconomic History  . Marital status: Married    Spouse name: Not on file  . Number of children: 3  . Years of education: 1114. Highest education level: 11th grade  Occupational History  . Occupation: baPress photographerOTHER    Comment: retired  Tobacco Use  . Smoking status: Never Smoker  . Smokeless tobacco: Never Used  Vaping Use  . Vaping Use: Never used  Substance and Sexual Activity  . Alcohol use: No    Alcohol/week: 0.0 standard drinks  . Drug use: No  . Sexual activity: Not on file  Other Topics Concern  . Not on file  Social History Narrative      1/2 -1 soda a day     Right handed   Two story home   lives with spouse   Social Determinants of Health   Financial Resource Strain: Not on file  Food Insecurity: Not on file  Transportation Needs: Not on file  Physical Activity: Not on file  Stress: Not on file  Social Connections: Not on file  Intimate Partner Violence: Not on file     BP 138/64   Pulse (!) 55   Ht 4' 11" (1.499 m)   Wt 121 lb 3.2 oz (55 kg)   SpO2 94%   BMI 24.48 kg/m   Physical Exam:  stable appearing elderly woman, wearing head gear, NAD HEENT: Unremarkable Neck:  No JVD, no thyromegally Lymphatics:  No adenopathy Back:  No CVA tenderness Lungs:  Clear with no wheezes HEART:  Regular rate rhythm, no murmurs, no rubs, no clicks Abd:  soft, positive bowel sounds, no organomegally, no rebound, no guarding Ext:  2 plus pulses, no edema, no cyanosis, no clubbing Skin:  No rashes no nodules Neuro:  CN II through XII intact, motor grossly intact  EKG -  sinus brady with LBBB   Assess/Plan: 1. PAF - she is maintaining NSR on low dose  amiodarone. She will continue with her current meds. I asked her to reduce her dosing so that she takes 200 mg daily, none on Sunday.  2. HTN - her blood pressure log has been reviewed and she is in the 120-130 mostly.  3. Coags - she will continue warfarin. She may need a little more with a reduction in amio dosing. She will follow up with Dr. Sharlett Iles. 4. LV dysfunction and LBBB - she is asymptomatic. I discussed the warning symptoms and if she is not passing out or having CHF, no indication for a biv PPM.  Salome Spotted

## 2021-04-09 NOTE — Patient Instructions (Addendum)
Medication Instructions:  Your physician has recommended you make the following change in your medication:   1.  REDUCE your amiodarone 200 mg- Take one tablet by mouth daily Monday through Saturday.  Do NOT take on Sunday.   Labwork: None ordered.  Testing/Procedures: None ordered.  Follow-Up: Your physician wants you to follow-up in: one year with Cristopher Peru, MD or one of the following Advanced Practice Providers on your designated Care Team:    Chanetta Marshall, NP  Tommye Standard, PA-C  Legrand Como "Jonni Sanger" Chalmers Cater, [place]   Any Other Special Instructions Will Be Listed Below (If Applicable).  If you need a refill on your cardiac medications before your next appointment, please call your pharmacy.

## 2021-04-22 DIAGNOSIS — R0602 Shortness of breath: Secondary | ICD-10-CM | POA: Diagnosis not present

## 2021-04-22 DIAGNOSIS — G20 Parkinson's disease: Secondary | ICD-10-CM | POA: Diagnosis not present

## 2021-04-28 DIAGNOSIS — Z7901 Long term (current) use of anticoagulants: Secondary | ICD-10-CM | POA: Diagnosis not present

## 2021-04-28 DIAGNOSIS — Z952 Presence of prosthetic heart valve: Secondary | ICD-10-CM | POA: Diagnosis not present

## 2021-04-28 DIAGNOSIS — I480 Paroxysmal atrial fibrillation: Secondary | ICD-10-CM | POA: Diagnosis not present

## 2021-04-29 DIAGNOSIS — D6869 Other thrombophilia: Secondary | ICD-10-CM | POA: Diagnosis not present

## 2021-04-29 DIAGNOSIS — D692 Other nonthrombocytopenic purpura: Secondary | ICD-10-CM | POA: Diagnosis not present

## 2021-04-29 DIAGNOSIS — E261 Secondary hyperaldosteronism: Secondary | ICD-10-CM | POA: Diagnosis not present

## 2021-04-29 DIAGNOSIS — N184 Chronic kidney disease, stage 4 (severe): Secondary | ICD-10-CM | POA: Diagnosis not present

## 2021-04-29 DIAGNOSIS — I480 Paroxysmal atrial fibrillation: Secondary | ICD-10-CM | POA: Diagnosis not present

## 2021-04-29 DIAGNOSIS — G231 Progressive supranuclear ophthalmoplegia [Steele-Richardson-Olszewski]: Secondary | ICD-10-CM | POA: Diagnosis not present

## 2021-04-29 DIAGNOSIS — I2510 Atherosclerotic heart disease of native coronary artery without angina pectoris: Secondary | ICD-10-CM | POA: Diagnosis not present

## 2021-04-29 DIAGNOSIS — I5040 Unspecified combined systolic (congestive) and diastolic (congestive) heart failure: Secondary | ICD-10-CM | POA: Diagnosis not present

## 2021-04-29 DIAGNOSIS — Z7901 Long term (current) use of anticoagulants: Secondary | ICD-10-CM | POA: Diagnosis not present

## 2021-04-29 DIAGNOSIS — Z8679 Personal history of other diseases of the circulatory system: Secondary | ICD-10-CM | POA: Diagnosis not present

## 2021-04-29 DIAGNOSIS — R296 Repeated falls: Secondary | ICD-10-CM | POA: Diagnosis not present

## 2021-04-29 DIAGNOSIS — I739 Peripheral vascular disease, unspecified: Secondary | ICD-10-CM | POA: Diagnosis not present

## 2021-05-05 DIAGNOSIS — L2989 Other pruritus: Secondary | ICD-10-CM | POA: Insufficient documentation

## 2021-05-05 DIAGNOSIS — L298 Other pruritus: Secondary | ICD-10-CM | POA: Diagnosis not present

## 2021-05-07 ENCOUNTER — Other Ambulatory Visit: Payer: Self-pay | Admitting: Nurse Practitioner

## 2021-05-07 ENCOUNTER — Other Ambulatory Visit: Payer: Self-pay | Admitting: Cardiology

## 2021-05-11 ENCOUNTER — Other Ambulatory Visit: Payer: Self-pay | Admitting: Nurse Practitioner

## 2021-05-11 ENCOUNTER — Other Ambulatory Visit: Payer: Self-pay | Admitting: Cardiology

## 2021-05-15 NOTE — Progress Notes (Signed)
Cardiology Office Note   Date:  05/18/2021   ID:  [name] September 24, 1941, MRN [id number]  PCP:  Leanna Battles, MD  Cardiologist:   Christy Snare Martinique, MD   Chief Complaint  Patient presents with  . Atrial Fibrillation      History of Present Illness: [name] is a 80 y.o. female seen for evaluation of CHF. She has a  h/o severe AS s/p SJM mech AVR in 2007. She also has a h/o PAF and is chronically anticoagulated with coumadin. She has a history of bradycardia on beta blocker.  September 2017 following a urologic procedure she developed abd pain, nausea, c/p, sob, and palpitations, prompting her to call EMS. She was found to be in AF with RVR, which later converted to sinus rhythm. She was admitted to [facility] and had mild troponin elevation. She underwent stress testing, which was low risk. She was seen in October 2017by Dr Caryl Comes who felt her Afib episodes were infrequent and recommended following for now. If episodes became more frequent then consider resumption of beta blocker or AAD without rate slowing effects such as Tikosyn.   She does have a history of atypical Parikinsons and progressive supranuclear palsy followed by Neurology.   Admitted 12/10-12/14/2019 for Afib RVR, chest pain, anemia (Hgb 5.6) following a fall/trauma, extensive hematoma left chest. She became volume overloaded post transfusion>>BiPAP>>diuresed, low-dose amio started for elevated HR.  Seen by Dr Lovena Le in March 2020 and appeared to be maintaining NSR on amiodarone. She has multiple falls.  She was seen in the ED on 09/09/19 with mechanical fall and facial laceration. CT negative for bleed. INR therapeutic. She denies any significant palpitations. No syncope. No chest pain or dyspnea.   When seen in [place] clinic in March 2021 only noted 2 episodes of Afib lasting minutes. Seen in ED on March 29 following a fall. CT and Xrays negative.   She was seen  in the ED on 06/19/20  after a mechanical fall and scalp laceration. Head CT showed no bleeding. She complained of increased wheezing and cough. CXR showed CM with low lung volumes. No clear edema. BNP was elevated at 725. She was DC on lasix 20 mg daily and outpatient Echo ordered. This demonstrated significant decreased in EF to 30-35% down from 40-45% with global HK. AV prosthesis was functioning normally. Severe LAE. She was started on [facility] as outpatient. This resulted in low BP, diarrhea and HA and was discontinued.    She was seen by Dr Lovena Le in April. Maintaining NSR and amiodarone dose reduced to 200 mg daily except none on Sunday.   She is seen with her husband today. Denies any chest pain, dyspnea, edema, or palpitations. Coumadin checks have been good. BP at home has been well controlled. Notes occasional skipped beats but has not had to use metoprolol for HR > 100.      Past Medical History:  Diagnosis Date  . Arthropathy of cervical facet joint    C2-3  . At risk for falls   . BPV (benign positional vertigo)   . Cancer ([place]) 2004    breast-lumpectomy,nodes ,radiation,chemo  . Chronic neck pain   . CKD (chronic kidney disease), stage II   . Diverticulosis of colon   . Elevated troponin    a. 08/2016 in setting of PAF;  b. 08/2016 low risk MV w/ fixed septal defect (LBBB), EF 53%.  Marland Kitchen GERD (gastroesophageal reflux disease)   . Hiatal hernia   .  History of aortic valve stenosis   . History of breast cancer per pt no recurrence   dx 2004-- Left breast DCIS (ER/PR+, HER2 negative) s/p  partial masectomy w/ sln bx and  chemoradiotion (completed 2004)  . History of colon polyps   . History of herpes zoster   . History of kidney stones   . HTN (hypertension)   . Hyperlipidemia   . Hypertrophy of inter-atrial septum    a. 09/2014 Echo: ? of atrial mass; b. 10/2014 Cardiac MRI: no atrial septal mass - polypoid lipomatous hypertrophy of superior atrial septum noted-->Benign.  . Irritable bowel syndrome    . LBBB (left bundle branch block)   . PAF (paroxysmal atrial fibrillation) (HCC)    a. CHA2DS2VASc = 4-->coumadin.  . Parkinsonism [facility])    neurologist-  dr tat  . Personal history of chemotherapy 2004  . Personal history of radiation therapy 2004  . PONV (postoperative nausea and vomiting)   . PVC's (premature ventricular contractions)   . Right ureteral stone   . S/P aortic valve replacement with prosthetic valve    a. 04/13/2006 s/p St Jude mechnical AVR for severe AS;  b. 09/2014 Echo: EF 40-45%, gr1 DD, mild AI/MR, triv TR/PR.  . SUI (stress urinary incontinence, female)   . Symptomatic anemia   . Venous varices      Past Surgical History:  Procedure Laterality Date  . ANTERIOR AND POSTERIOR REPAIR  1990   cystocele/ rectocele  . AORTIC VALVE REPLACEMENT  04/13/06   dr gerhardt   and Replacement Ascending Aorta (St. Jude mechanical prosthesis)  . APPENDECTOMY  child 1950  . BREAST EXCISIONAL BIOPSY Left   . BREAST LUMPECTOMY Left 01/11/2003   malignant  . BREAST SURGERY Left 2004  . CARDIAC CATHETERIZATION  02-22-2001   dr [name]   minimal non-obstructive cad/  mild aortic stenosis and aortic root enlargement/  normal LVF, ef 65%  . CARDIAC CATHETERIZATION  03-22-2006    dr [name]   no significant obstructive cad/  severe aortic stenosis and mild to moderate aortic root enlargement/  upper normal right heart pressures  . CARDIOVASCULAR STRESS TEST  09-30-2011   dr [name]   lexiscan nuclear study w/ apical thinning  vs  small prior apical infarct w/ no significant ischemia/  hypokinesis of the distal septum and apex, ef 50%  . CARPAL TUNNEL RELEASE Bilateral left 09-09-2004/  right 2007  . CATARACT EXTRACTION W/ INTRAOCULAR LENS  IMPLANT, BILATERAL  2015  . COLONOSCOPY N/A 10/03/2014   Procedure: COLONOSCOPY;  Surgeon: [name], MD;  Location: [facility];  Service: Endoscopy;  Laterality: N/A;  . CYSTOSCOPY WITH RETROGRADE PYELOGRAM, URETEROSCOPY AND STENT PLACEMENT  Right 09/17/2016   Procedure: CYSTOSCOPY WITH RIGHT RETROGRADE PYELOGRAM, RIGHT URETEROSCOPY AND STENT PLACEMENT LASER LITHOTRIPSY, RIGHT STENT PLACEMENT;  Surgeon: [name], MD;  Location: [facility];  Service: Urology;  Laterality: Right;  . ESOPHAGEAL MANOMETRY N/A 08/12/2014   Procedure: ESOPHAGEAL MANOMETRY (EM);  Surgeon: [name], MD;  Location: [facility];  Service: Endoscopy;  Laterality: N/A;  . ESOPHAGOGASTRODUODENOSCOPY N/A 10/03/2014   Procedure: ESOPHAGOGASTRODUODENOSCOPY (EGD);  Surgeon: [name], MD;  Location: [facility];  Service: Endoscopy;  Laterality: N/A;  . ESOPHAGOGASTRODUODENOSCOPY N/A 12/08/2018   Procedure: ESOPHAGOGASTRODUODENOSCOPY (EGD);  Surgeon: Doran Stabler, MD;  Location: [facility];  Service: Gastroenterology;  Laterality: N/A;  . [place]  . FOOT SURGERY Right 2013   hammertoe x2  .  HOLMIUM LASER APPLICATION Right 8/88/2800   Procedure: HOLMIUM LASER APPLICATION;  Surgeon: [name], MD;  Location: [facility];  Service: Urology;  Laterality: Right;  . PERCUTANEOUS NEPHROSTOLITHOTOMY  1993  . STONE EXTRACTION WITH BASKET Right 09/17/2016   Procedure: STONE EXTRACTION WITH BASKET;  Surgeon: [name], MD;  Location: [facility];  Service: Urology;  Laterality: Right;  . TOTAL ABDOMINAL HYSTERECTOMY  1971   w/ Bilateral Salpingoophorectomy  . TRANSTHORACIC ECHOCARDIOGRAM  10/24/2014   dr [name]   hypokinesis of the basal and mid inferoseptal and inferior walls,  ef 40-45%,  grade 1 diastolic dysfunction/  mechanical bileaflet aortic valve noted w/ mild regur., peak grandient 73mHg, valve area 1.35cm^2/  severe MV calcification without stenosis w/ mild regurg., peak gradient 366mg/  mild LAE/  poss. atrial mass (MRI showed benign lipomatous hypertrophy of atrial septum) /tFrazier ButtR/mild TR     Current Outpatient Medications   Medication Sig Dispense Refill  . acetaminophen (TYLENOL) 500 MG tablet Take 1,000 mg by mouth every 6 (six) hours as needed (head pain).     . Marland Kitchenmiodarone (PACERONE) 200 MG tablet Take 1 tablet by mouth once daily 90 tablet 3  . amoxicillin (AMOXIL) 500 MG capsule Before dentist    . calcium carbonate (TUMS - DOSED IN MG ELEMENTAL CALCIUM) 500 MG chewable tablet Chew 1 tablet by mouth daily as needed for indigestion or heartburn.     . calcium citrate-vitamin D (CITRACAL+D) 315-200 MG-UNIT per tablet Take 1 tablet by mouth 3 (three) times daily.    . carbidopa-levodopa (SINEMET IR) 25-100 MG tablet Take 2 tablets by mouth 3 (three) times daily. 540 tablet 2  . Cholecalciferol (VITAMIN D3) 2000 units TABS Take 2,000 Units by mouth daily.     . famotidine-calcium carbonate-magnesium hydroxide (PEPCID COMPLETE) 10-800-165 MG CHEW chewable tablet Chew 1 tablet by mouth daily as needed (heartburn).    . fexofenadine (ALLEGRA) 60 MG tablet Take 60 mg by mouth daily.    . furosemide (LASIX) 20 MG tablet Take 1 tablet by mouth once daily 90 tablet 3  . KLOR-CON M20 20 MEQ tablet Take 20 mEq by mouth daily.    . Melatonin 3 MG TABS Take 3 mg by mouth at bedtime as needed (sleep).     . metoprolol tartrate (LOPRESSOR) 25 MG tablet Take 1 tablet (25 mg total) by mouth daily as needed (if heart rate is above 110). 90 tablet 3  . Multiple Vitamin (MULTIVITAMIN) tablet Take 1 tablet by mouth daily at 12 noon.    . pantoprazole (PROTONIX) 40 MG tablet Take 40 mg by mouth daily.    . Probiotic Product (PROBIOTIC-10 PO) Take 1 capsule by mouth daily.     . simvastatin (ZOCOR) 20 MG tablet Take 20 mg by mouth every evening.    . vitamin B-12 (CYANOCOBALAMIN) 1000 MCG tablet Take 1,000 mcg by mouth every morning.    . warfarin (COUMADIN) 5 MG tablet Take 5 mg by mouth daily. Patient is taking 2.46m66mn Sunday, Tues, ThuDardenat. Patient is taking 5 mg on Monday, Wed, Friday.     No current facility-administered  medications for this visit.    Allergies:   Demerol [meperidine], Oxycodone, Adhesive [tape], Ivp dye [iodinated diagnostic agents], and Phenergan [promethazine]    Social History:  The patient  reports that she has never smoked. She has never used smokeless tobacco. She reports that she does not drink alcohol and does not use drugs.  Family History:  The patient's family history includes Healthy in her child; Heart disease in her mother; Heart disease (age of onset: 74) in her father; Other in her brother.    ROS:  Please see the history of present illness.   Otherwise, review of systems are positive for none.   All other systems are reviewed and negative.    PHYSICAL EXAM: VS:  BP 138/70 (BP Location: Right Arm, Patient Position: Sitting, Cuff Size: Normal)   Pulse (!) 49   Ht 4' 11.75" (1.518 m)   Wt 122 lb 6.4 oz (55.5 kg)   SpO2 96%   BMI 24.11 kg/m  , BMI Body mass index is 24.11 kg/m. GEN: Well nourished, well developed, in no acute distress  HEENT: normal  Neck: no JVD, carotid bruits, or masses Cardiac: RRR; good mechanical AV click. SEM RUSB Respiratory:  clear to auscultation bilaterally, normal work of breathing GI: soft, nontender, nondistended, + BS MS: no deformity or atrophy  Skin: warm and dry, no rash Neuro:  Strength and sensation are intact Psych: euthymic mood, full affect   EKG:  EKG is not ordered today. The ekg ordered today demonstrates N/A   Recent Labs: 06/17/2020: ALT 36; B Natriuretic Peptide 725.1; Hemoglobin 11.6; Platelets 183 07/01/2020: BUN 32; Creatinine, Ser 1.70; Potassium 4.0; Sodium 141    Lipid Panel    Component Value Date/Time   CHOL 182 04/01/2020 0849   TRIG 92 04/01/2020 0849   HDL 89 04/01/2020 0849   CHOLHDL 2.0 04/01/2020 0849   CHOLHDL 1.9 09/30/2011 0415   VLDL 8 09/30/2011 0415   LDLCALC 77 04/01/2020 0849    Dated 06/12/19: cholesterol 147, triglycerides 74, HDL 64, LDL 68.   Wt Readings from Last 3 Encounters:   05/18/21 122 lb 6.4 oz (55.5 kg)  04/09/21 121 lb 3.2 oz (55 kg)  03/11/21 119 lb (54 kg)      Other studies Reviewed: Additional studies/ records that were reviewed today include:   11/17/18: TTE Study Conclusions - Left ventricle: The cavity size was normal. Wall thickness was normal. Systolic function was mildly reduced. The estimated ejection fraction was in the range of 45% to 50%. Features are consistent with a pseudonormal left ventricular filling pattern, with concomitant abnormal relaxation and increased filling pressure (grade 2 diastolic dysfunction). - Aortic valve: A mechanical prosthesis was present. There was mild regurgitation. Valve area (VTI): 1.43 cm^2. Valve area (Vmax): 1.28 cm^2. Valve area (Vmean): 1.2 cm^2. - Mitral valve: Mildly to moderately calcified annulus. Valve area by continuity equation (using LVOT flow): 1.55 cm^2. - Left atrium: The atrium was mildly dilated.   09/21/18 stress myoview IMPRESSION: 1. No evidence reversible ischemia. Fixed defect in the septal wall with differential including infarction versus artifact from LEFT bundle branch block. 2. Septal dyskinesia in patient with LEFT bundle branch block. 3. Left ventricular ejection fraction 53% 4. Non invasive risk stratification*: Low  Echo 06/20/20: IMPRESSIONS    1. Moderate global reduction in LV systolic function; s/p AVR with mild  AI and mean gradient 12 mmHg; biatrial enlargement; mild TR.  2. Left ventricular ejection fraction, by estimation, is 30 to 35%. The  left ventricle has moderately decreased function. The left ventricle  demonstrates global hypokinesis. Left ventricular diastolic parameters are  consistent with Grade II diastolic  dysfunction (pseudonormalization).  3. Right ventricular systolic function is normal. The right ventricular  size is normal. Tricuspid regurgitation signal is inadequate for assessing  PA pressure.  4. Left atrial  size was severely dilated.  5. Right atrial size was mildly dilated.  6. The mitral valve is normal in structure. Trivial mitral valve  regurgitation. No evidence of mitral stenosis.  7. The aortic valve has been repaired/replaced. Aortic valve  regurgitation is mild. No aortic stenosis is present. There is a unknown  St. Jude mechanical valve present in the aortic position. Procedure Date:  04/13/2006.    ASSESSMENT AND PLAN:  1. Chronic combined systolic/diastolic CHF. EF down to 30-35%. Will continue lasix 20 mg daily. Intolerant of Entresto with hypotension. intolerant of metoprolol due to bradycardia. Continue Sodium restriction.   2.  PAF/Tachy-brady syndrome: Pt is now on amiodarone and appears to be maintaining NSR well.  Previously she had history of severe brady on beta blockers. This will limit titration of beta blocker. Amiodarone dose reduced in April.  2. S/P SJM AVR: Stable on echo in September 2019.  continueChronic coumadin. SBE prophylaxis. Good valve click on exam.   3. Essential HTN: Controlled  4. QIH:[id number]. Cont statin.   5. CKD stage 3    Disposition:   FU with me in 6 months  Signed, Peter Martinique, MD  05/18/2021 9:26 AM    [facility] [address] [place], [place], [address] Phone [phone], Fax [phone]

## 2021-05-18 ENCOUNTER — Ambulatory Visit: Payer: PPO | Admitting: Cardiology

## 2021-05-18 ENCOUNTER — Other Ambulatory Visit: Payer: Self-pay

## 2021-05-18 ENCOUNTER — Encounter: Payer: Self-pay | Admitting: Cardiology

## 2021-05-18 VITALS — BP 138/70 | HR 49 | Ht 59.75 in | Wt 122.4 lb

## 2021-05-18 DIAGNOSIS — I480 Paroxysmal atrial fibrillation: Secondary | ICD-10-CM | POA: Diagnosis not present

## 2021-05-18 DIAGNOSIS — I495 Sick sinus syndrome: Secondary | ICD-10-CM | POA: Diagnosis not present

## 2021-05-18 DIAGNOSIS — Z7901 Long term (current) use of anticoagulants: Secondary | ICD-10-CM | POA: Diagnosis not present

## 2021-05-18 DIAGNOSIS — I10 Essential (primary) hypertension: Secondary | ICD-10-CM

## 2021-05-18 DIAGNOSIS — Z952 Presence of prosthetic heart valve: Secondary | ICD-10-CM | POA: Diagnosis not present

## 2021-05-22 DIAGNOSIS — R0602 Shortness of breath: Secondary | ICD-10-CM | POA: Diagnosis not present

## 2021-05-22 DIAGNOSIS — G20 Parkinson's disease: Secondary | ICD-10-CM | POA: Diagnosis not present

## 2021-06-02 DIAGNOSIS — Z952 Presence of prosthetic heart valve: Secondary | ICD-10-CM | POA: Diagnosis not present

## 2021-06-02 DIAGNOSIS — Z7901 Long term (current) use of anticoagulants: Secondary | ICD-10-CM | POA: Diagnosis not present

## 2021-06-02 DIAGNOSIS — I480 Paroxysmal atrial fibrillation: Secondary | ICD-10-CM | POA: Diagnosis not present

## 2021-06-10 ENCOUNTER — Other Ambulatory Visit: Payer: Self-pay | Admitting: Internal Medicine

## 2021-06-10 DIAGNOSIS — Z1231 Encounter for screening mammogram for malignant neoplasm of breast: Secondary | ICD-10-CM

## 2021-06-22 DIAGNOSIS — G20 Parkinson's disease: Secondary | ICD-10-CM | POA: Diagnosis not present

## 2021-06-22 DIAGNOSIS — R0602 Shortness of breath: Secondary | ICD-10-CM | POA: Diagnosis not present

## 2021-06-23 DIAGNOSIS — E785 Hyperlipidemia, unspecified: Secondary | ICD-10-CM | POA: Diagnosis not present

## 2021-06-23 DIAGNOSIS — M859 Disorder of bone density and structure, unspecified: Secondary | ICD-10-CM | POA: Diagnosis not present

## 2021-06-30 DIAGNOSIS — Z7901 Long term (current) use of anticoagulants: Secondary | ICD-10-CM | POA: Diagnosis not present

## 2021-06-30 DIAGNOSIS — R1310 Dysphagia, unspecified: Secondary | ICD-10-CM | POA: Diagnosis not present

## 2021-06-30 DIAGNOSIS — I447 Left bundle-branch block, unspecified: Secondary | ICD-10-CM | POA: Diagnosis not present

## 2021-06-30 DIAGNOSIS — G231 Progressive supranuclear ophthalmoplegia [Steele-Richardson-Olszewski]: Secondary | ICD-10-CM | POA: Diagnosis not present

## 2021-06-30 DIAGNOSIS — N6342 Unspecified lump in left breast, subareolar: Secondary | ICD-10-CM | POA: Diagnosis not present

## 2021-06-30 DIAGNOSIS — Z0000 Encounter for general adult medical examination without abnormal findings: Secondary | ICD-10-CM | POA: Diagnosis not present

## 2021-06-30 DIAGNOSIS — R269 Unspecified abnormalities of gait and mobility: Secondary | ICD-10-CM | POA: Diagnosis not present

## 2021-06-30 DIAGNOSIS — N184 Chronic kidney disease, stage 4 (severe): Secondary | ICD-10-CM | POA: Diagnosis not present

## 2021-06-30 DIAGNOSIS — I480 Paroxysmal atrial fibrillation: Secondary | ICD-10-CM | POA: Diagnosis not present

## 2021-06-30 DIAGNOSIS — Z952 Presence of prosthetic heart valve: Secondary | ICD-10-CM | POA: Diagnosis not present

## 2021-06-30 DIAGNOSIS — I129 Hypertensive chronic kidney disease with stage 1 through stage 4 chronic kidney disease, or unspecified chronic kidney disease: Secondary | ICD-10-CM | POA: Diagnosis not present

## 2021-06-30 DIAGNOSIS — R82998 Other abnormal findings in urine: Secondary | ICD-10-CM | POA: Diagnosis not present

## 2021-06-30 DIAGNOSIS — K219 Gastro-esophageal reflux disease without esophagitis: Secondary | ICD-10-CM | POA: Diagnosis not present

## 2021-07-03 ENCOUNTER — Ambulatory Visit: Payer: PPO | Attending: Internal Medicine

## 2021-07-03 ENCOUNTER — Other Ambulatory Visit: Payer: Self-pay

## 2021-07-07 ENCOUNTER — Other Ambulatory Visit: Payer: Self-pay | Admitting: Internal Medicine

## 2021-07-07 DIAGNOSIS — N63 Unspecified lump in unspecified breast: Secondary | ICD-10-CM

## 2021-07-07 DIAGNOSIS — Z7901 Long term (current) use of anticoagulants: Secondary | ICD-10-CM | POA: Diagnosis not present

## 2021-07-07 DIAGNOSIS — I480 Paroxysmal atrial fibrillation: Secondary | ICD-10-CM | POA: Diagnosis not present

## 2021-07-07 DIAGNOSIS — Z952 Presence of prosthetic heart valve: Secondary | ICD-10-CM | POA: Diagnosis not present

## 2021-07-14 ENCOUNTER — Ambulatory Visit: Payer: PPO

## 2021-07-14 DIAGNOSIS — Z7901 Long term (current) use of anticoagulants: Secondary | ICD-10-CM | POA: Diagnosis not present

## 2021-07-14 DIAGNOSIS — Z954 Presence of other heart-valve replacement: Secondary | ICD-10-CM | POA: Diagnosis not present

## 2021-07-14 DIAGNOSIS — I480 Paroxysmal atrial fibrillation: Secondary | ICD-10-CM | POA: Diagnosis not present

## 2021-07-22 DIAGNOSIS — R0602 Shortness of breath: Secondary | ICD-10-CM | POA: Diagnosis not present

## 2021-07-22 DIAGNOSIS — G20 Parkinson's disease: Secondary | ICD-10-CM | POA: Diagnosis not present

## 2021-07-27 ENCOUNTER — Other Ambulatory Visit: Payer: Self-pay

## 2021-07-27 ENCOUNTER — Ambulatory Visit
Admission: RE | Admit: 2021-07-27 | Discharge: 2021-07-27 | Disposition: A | Discharge status: Home / Self Care | Payer: PPO | Source: Ambulatory Visit | Attending: Internal Medicine | Admitting: Internal Medicine

## 2021-07-27 DIAGNOSIS — N63 Unspecified lump in unspecified breast: Secondary | ICD-10-CM

## 2021-07-27 DIAGNOSIS — Z853 Personal history of malignant neoplasm of breast: Secondary | ICD-10-CM | POA: Diagnosis not present

## 2021-07-27 DIAGNOSIS — R928 Other abnormal and inconclusive findings on diagnostic imaging of breast: Secondary | ICD-10-CM | POA: Diagnosis not present

## 2021-07-27 DIAGNOSIS — N6489 Other specified disorders of breast: Secondary | ICD-10-CM | POA: Diagnosis not present

## 2021-07-29 DIAGNOSIS — E261 Secondary hyperaldosteronism: Secondary | ICD-10-CM | POA: Diagnosis not present

## 2021-07-29 DIAGNOSIS — Z993 Dependence on wheelchair: Secondary | ICD-10-CM | POA: Diagnosis not present

## 2021-07-29 DIAGNOSIS — G231 Progressive supranuclear ophthalmoplegia [Steele-Richardson-Olszewski]: Secondary | ICD-10-CM | POA: Diagnosis not present

## 2021-07-29 DIAGNOSIS — Z7901 Long term (current) use of anticoagulants: Secondary | ICD-10-CM | POA: Diagnosis not present

## 2021-07-29 DIAGNOSIS — I5040 Unspecified combined systolic (congestive) and diastolic (congestive) heart failure: Secondary | ICD-10-CM | POA: Diagnosis not present

## 2021-07-29 DIAGNOSIS — Z6824 Body mass index (BMI) 24.0-24.9, adult: Secondary | ICD-10-CM | POA: Diagnosis not present

## 2021-07-29 DIAGNOSIS — Z9181 History of falling: Secondary | ICD-10-CM | POA: Diagnosis not present

## 2021-07-29 DIAGNOSIS — I2510 Atherosclerotic heart disease of native coronary artery without angina pectoris: Secondary | ICD-10-CM | POA: Diagnosis not present

## 2021-07-29 DIAGNOSIS — D692 Other nonthrombocytopenic purpura: Secondary | ICD-10-CM | POA: Diagnosis not present

## 2021-08-04 ENCOUNTER — Ambulatory Visit: Payer: PPO

## 2021-08-04 ENCOUNTER — Other Ambulatory Visit (HOSPITAL_BASED_OUTPATIENT_CLINIC_OR_DEPARTMENT_OTHER): Payer: Self-pay

## 2021-08-04 ENCOUNTER — Ambulatory Visit: Payer: PPO | Attending: Internal Medicine

## 2021-08-04 DIAGNOSIS — Z23 Encounter for immunization: Secondary | ICD-10-CM

## 2021-08-04 MED ORDER — PFIZER-BIONT COVID-19 VAC-TRIS 30 MCG/0.3ML IM SUSP
INTRAMUSCULAR | 0 refills | Status: DC
  Filled 2021-08-04: qty 0.3, 1d supply, fill #0

## 2021-08-04 NOTE — Progress Notes (Signed)
   Covid-19 Vaccination Clinic  Name:  Lakesha Levinson    MRN: [id number] DOB: Jun 23, 1941  08/04/2021  Ms. Midkiff was observed post Covid-19 immunization for 15 minutes without incident. She was provided with Vaccine Information Sheet and instruction to access the V-Safe system.   Ms. Ragon was instructed to call 911 with any severe reactions post vaccine: Difficulty breathing  Swelling of face and throat  A fast heartbeat  A bad rash all over body  Dizziness and weakness   Immunizations Administered     Name Date Dose VIS Date Route   PFIZER Comrnaty(Gray TOP) Covid-19 Vaccine 08/04/2021  1:53 PM 0.3 mL 12/04/2020 Intramuscular   Manufacturer: [employer]   Lot: [id number]   NDC: [phone]

## 2021-08-06 DIAGNOSIS — I480 Paroxysmal atrial fibrillation: Secondary | ICD-10-CM | POA: Diagnosis not present

## 2021-08-06 DIAGNOSIS — Z952 Presence of prosthetic heart valve: Secondary | ICD-10-CM | POA: Diagnosis not present

## 2021-08-06 DIAGNOSIS — Z7901 Long term (current) use of anticoagulants: Secondary | ICD-10-CM | POA: Diagnosis not present

## 2021-08-22 DIAGNOSIS — R0602 Shortness of breath: Secondary | ICD-10-CM | POA: Diagnosis not present

## 2021-08-22 DIAGNOSIS — G20 Parkinson's disease: Secondary | ICD-10-CM | POA: Diagnosis not present

## 2021-09-08 DIAGNOSIS — Z7901 Long term (current) use of anticoagulants: Secondary | ICD-10-CM | POA: Diagnosis not present

## 2021-09-08 DIAGNOSIS — I480 Paroxysmal atrial fibrillation: Secondary | ICD-10-CM | POA: Diagnosis not present

## 2021-09-08 DIAGNOSIS — Z952 Presence of prosthetic heart valve: Secondary | ICD-10-CM | POA: Diagnosis not present

## 2021-09-10 ENCOUNTER — Emergency Department (HOSPITAL_COMMUNITY)
Admission: EM | Admit: 2021-09-10 | Discharge: 2021-09-10 | Disposition: A | Discharge status: Home / Self Care | Payer: PPO | Attending: Emergency Medicine | Admitting: Emergency Medicine

## 2021-09-10 ENCOUNTER — Other Ambulatory Visit: Payer: Self-pay

## 2021-09-10 ENCOUNTER — Encounter (HOSPITAL_COMMUNITY): Payer: Self-pay | Admitting: Emergency Medicine

## 2021-09-10 ENCOUNTER — Emergency Department (HOSPITAL_COMMUNITY): Payer: PPO

## 2021-09-10 DIAGNOSIS — Z853 Personal history of malignant neoplasm of breast: Secondary | ICD-10-CM | POA: Diagnosis not present

## 2021-09-10 DIAGNOSIS — I10 Essential (primary) hypertension: Secondary | ICD-10-CM | POA: Diagnosis not present

## 2021-09-10 DIAGNOSIS — N183 Chronic kidney disease, stage 3 unspecified: Secondary | ICD-10-CM | POA: Insufficient documentation

## 2021-09-10 DIAGNOSIS — I480 Paroxysmal atrial fibrillation: Secondary | ICD-10-CM | POA: Insufficient documentation

## 2021-09-10 DIAGNOSIS — S0181XA Laceration without foreign body of other part of head, initial encounter: Secondary | ICD-10-CM

## 2021-09-10 DIAGNOSIS — S01112A Laceration without foreign body of left eyelid and periocular area, initial encounter: Secondary | ICD-10-CM | POA: Insufficient documentation

## 2021-09-10 DIAGNOSIS — W01198A Fall on same level from slipping, tripping and stumbling with subsequent striking against other object, initial encounter: Secondary | ICD-10-CM | POA: Diagnosis not present

## 2021-09-10 DIAGNOSIS — Z7901 Long term (current) use of anticoagulants: Secondary | ICD-10-CM | POA: Diagnosis not present

## 2021-09-10 DIAGNOSIS — I5042 Chronic combined systolic (congestive) and diastolic (congestive) heart failure: Secondary | ICD-10-CM | POA: Diagnosis not present

## 2021-09-10 DIAGNOSIS — Z043 Encounter for examination and observation following other accident: Secondary | ICD-10-CM | POA: Diagnosis not present

## 2021-09-10 DIAGNOSIS — Z79899 Other long term (current) drug therapy: Secondary | ICD-10-CM | POA: Insufficient documentation

## 2021-09-10 DIAGNOSIS — G319 Degenerative disease of nervous system, unspecified: Secondary | ICD-10-CM | POA: Diagnosis not present

## 2021-09-10 DIAGNOSIS — G20 Parkinson's disease: Secondary | ICD-10-CM | POA: Diagnosis not present

## 2021-09-10 DIAGNOSIS — I130 Hypertensive heart and chronic kidney disease with heart failure and stage 1 through stage 4 chronic kidney disease, or unspecified chronic kidney disease: Secondary | ICD-10-CM | POA: Diagnosis not present

## 2021-09-10 DIAGNOSIS — W19XXXA Unspecified fall, initial encounter: Secondary | ICD-10-CM

## 2021-09-10 DIAGNOSIS — S0993XA Unspecified injury of face, initial encounter: Secondary | ICD-10-CM | POA: Diagnosis present

## 2021-09-10 LAB — BASIC METABOLIC PANEL
Anion gap: 9 (ref 5–15)
BUN: 28 mg/dL — ABNORMAL HIGH (ref 8–23)
CO2: 29 mmol/L (ref 22–32)
Calcium: 9.5 mg/dL (ref 8.9–10.3)
Chloride: 100 mmol/L (ref 98–111)
Creatinine, Ser: 2.38 mg/dL — ABNORMAL HIGH (ref 0.44–1.00)
GFR, Estimated: 20 mL/min — ABNORMAL LOW (ref >60)
Glucose, Bld: 101 mg/dL — ABNORMAL HIGH (ref 70–99)
Potassium: 4.4 mmol/L (ref 3.5–5.1)
Sodium: 138 mmol/L (ref 135–145)

## 2021-09-10 LAB — PROTIME-INR
INR: 4 — ABNORMAL HIGH (ref 0.8–1.2)
Prothrombin Time: 39.3 seconds — ABNORMAL HIGH (ref 11.4–15.2)

## 2021-09-10 LAB — CBC WITH DIFFERENTIAL/PLATELET
Abs Immature Granulocytes: 0.04 10*3/uL (ref 0.00–0.07)
Basophils Absolute: 0.1 10*3/uL (ref 0.0–0.1)
Basophils Relative: 1 %
Eosinophils Absolute: 0.1 10*3/uL (ref 0.0–0.5)
Eosinophils Relative: 2 %
HCT: 35 % — ABNORMAL LOW (ref 36.0–46.0)
Hemoglobin: 11.5 g/dL — ABNORMAL LOW (ref 12.0–15.0)
Immature Granulocytes: 1 %
Lymphocytes Relative: 25 %
Lymphs Abs: 1.5 10*3/uL (ref 0.7–4.0)
MCH: 34.5 pg — ABNORMAL HIGH (ref 26.0–34.0)
MCHC: 32.9 g/dL (ref 30.0–36.0)
MCV: 105.1 fL — ABNORMAL HIGH (ref 80.0–100.0)
Monocytes Absolute: 0.5 10*3/uL (ref 0.1–1.0)
Monocytes Relative: 8 %
Neutro Abs: 3.9 10*3/uL (ref 1.7–7.7)
Neutrophils Relative %: 63 %
Platelets: 185 10*3/uL (ref 150–400)
RBC: 3.33 MIL/uL — ABNORMAL LOW (ref 3.87–5.11)
RDW: 14.6 % (ref 11.5–15.5)
WBC: 6.1 10*3/uL (ref 4.0–10.5)
nRBC: 0 % (ref 0.0–0.2)

## 2021-09-10 MED ORDER — LIDOCAINE-EPINEPHRINE (PF) 2 %-1:200000 IJ SOLN
10.0000 mL | Freq: Once | INTRAMUSCULAR | Status: AC
  Administered 2021-09-10: 10 mL via INTRADERMAL
  Filled 2021-09-10: qty 20

## 2021-09-10 NOTE — ED Notes (Signed)
Patient transported to CT 
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
## 2021-09-10 NOTE — ED Provider Notes (Signed)
[facility] EMERGENCY DEPARTMENT Provider Note   CSN: [id number] Arrival date & time: 09/10/21  1735     History Chief Complaint  Patient presents with   [name] [name] is a 80 y.o. female.  80 yo F with a chief complaints of a fall.  Patient states that she was reaching for something and she lost her balance and fell and struck the left side of her head.  She denies any other injury denies chest pain abdominal pain back pain neck pain extremity pain.  She states her tetanus is up-to-date.  Takes Coumadin for mechanical heart valve.  The history is provided by the patient.  Fall This is a new problem. The current episode started less than 1 hour ago. The problem occurs constantly. The problem has not changed since onset.Associated symptoms include headaches. Pertinent negatives include no chest pain and no shortness of breath. Nothing aggravates the symptoms. Nothing relieves the symptoms. She has tried nothing for the symptoms. The treatment provided no relief.      Past Medical History:  Diagnosis Date   Arthropathy of cervical facet joint    C2-3   At risk for falls    BPV (benign positional vertigo)    Cancer ([place]) 2004    breast-lumpectomy,nodes ,radiation,chemo   Chronic neck pain    CKD (chronic kidney disease), stage II    Diverticulosis of colon    Elevated troponin    a. 08/2016 in setting of PAF;  b. 08/2016 low risk MV w/ fixed septal defect (LBBB), EF 53%.   GERD (gastroesophageal reflux disease)    Hiatal hernia    History of aortic valve stenosis    History of breast cancer per pt no recurrence   dx 2004-- Left breast DCIS (ER/PR+, HER2 negative) s/p  partial masectomy w/ sln bx and  chemoradiotion (completed 2004)   History of colon polyps    History of herpes zoster    History of kidney stones    HTN (hypertension)    Hyperlipidemia    Hypertrophy of inter-atrial septum    a. 09/2014 Echo: ? of atrial mass; b. 10/2014  Cardiac MRI: no atrial septal mass - polypoid lipomatous hypertrophy of superior atrial septum noted-->Benign.   Irritable bowel syndrome    LBBB (left bundle branch block)    PAF (paroxysmal atrial fibrillation) (HCC)    a. CHA2DS2VASc = 4-->coumadin.   Parkinsonism [facility])    neurologist-  dr tat   Personal history of chemotherapy 2004   Personal history of radiation therapy 2004   PONV (postoperative nausea and vomiting)    PVC's (premature ventricular contractions)    Right ureteral stone    S/P aortic valve replacement with prosthetic valve    a. 04/13/2006 s/p St Jude mechnical AVR for severe AS;  b. 09/2014 Echo: EF 40-45%, gr1 DD, mild AI/MR, triv TR/PR.   SUI (stress urinary incontinence, female)    Symptomatic anemia    Venous varices      Patient Active Problem List   Diagnosis Date Noted   Paroxysmal atrial fibrillation ([place]) 04/09/2021   Chronic combined systolic and diastolic heart failure ([place]) 07/17/2020   PSP (progressive supranuclear palsy) ([place]) 02/29/2020   Atrial flutter (HCC)    Heme positive stool    Symptomatic anemia 12/05/2018   GI bleed 12/05/2018   CKD (chronic kidney disease), stage III ([place]) 12/05/2018   Elevated d-dimer 12/05/2018   SOB (shortness of breath) 12/05/2018  Supratherapeutic INR 12/05/2018   Parkinsonism ([place])    Elevated troponin 09/19/2016   Emesis 09/19/2016   UTI (urinary tract infection) 09/19/2016   Sinus bradycardia 09/16/2016   Family history of colon cancer 07/30/2014   Hiatal hernia 07/30/2014   Chest pain 10/13/2011   Disequilibrium 10/13/2011   Heart valve replaced by other means 04/16/2011   Chronic anticoagulation 03/17/2011   S/P AVR (aortic valve replacement)    PVC's (premature ventricular contractions)    HTN (hypertension)    Hypercholesteremia    Atrial fibrillation with RVR ([place])    GERD (gastroesophageal reflux disease)    GERD 02/06/2010   DIVERTICULOSIS-COLON 02/06/2010   PERSONAL HX COLONIC POLYPS  02/06/2010   MASTECTOMY, HX OF 02/06/2010    Past Surgical History:  Procedure Laterality Date   ANTERIOR AND POSTERIOR REPAIR  1990   cystocele/ rectocele   AORTIC VALVE REPLACEMENT  04/13/06   dr gerhardt   and Replacement Ascending Aorta (St. Jude mechanical prosthesis)   APPENDECTOMY  child 1950   BREAST EXCISIONAL BIOPSY Left    BREAST LUMPECTOMY Left 01/11/2003   malignant   BREAST SURGERY Left 2004   CARDIAC CATHETERIZATION  02-22-2001   dr Martinique   minimal non-obstructive cad/  mild aortic stenosis and aortic root enlargement/  normal LVF, ef 65%   CARDIAC CATHETERIZATION  03-22-2006    dr Martinique   no significant obstructive cad/  severe aortic stenosis and mild to moderate aortic root enlargement/  upper normal right heart pressures   CARDIOVASCULAR STRESS TEST  09-30-2011   dr Martinique   lexiscan nuclear study w/ apical thinning  vs  small prior apical infarct w/ no significant ischemia/  hypokinesis of the distal septum and apex, ef 50%   CARPAL TUNNEL RELEASE Bilateral left 09-09-2004/  right 2007   CATARACT EXTRACTION W/ INTRAOCULAR LENS  IMPLANT, BILATERAL  2015   COLONOSCOPY N/A 10/03/2014   Procedure: COLONOSCOPY;  Surgeon: [name], MD;  Location: [facility] END[facility]COPY;  Service: Endoscopy;  Laterality: N/A;   CYSTOSCOPY WITH RETROGRADE PYELOGRAM, URETEROSCOPY AND STENT PLACEMENT Right 09/17/2016   Procedure: CYSTOSCOPY WITH RIGHT RETROGRADE PYELOGRAM, RIGHT URETEROSCOPY AND STENT PLACEMENT LASER LITHOTRIPSY, RIGHT STENT PLACEMENT;  Surgeon: [name], MD;  Location: [facility];  Service: Urology;  Laterality: Right;   ESOPHAGEAL MANOMETRY N/A 08/12/2014   Procedure: ESOPHAGEAL MANOMETRY (EM);  Surgeon: [name], MD;  Location: [facility] ENDOSCOPY;  Service: Endoscopy;  Laterality: N/A;   ESOPHAGOGASTRODUODENOSCOPY N/A 10/03/2014   Procedure: ESOPHAGOGASTRODUODENOSCOPY (EGD);  Surgeon: [name], MD;  Location: [facility] ENDOSCOPY;  Service: Endoscopy;   Laterality: N/A;   ESOPHAGOGASTRODUODENOSCOPY N/A 12/08/2018   Procedure: ESOPHAGOGASTRODUODENOSCOPY (EGD);  Surgeon: Doran Stabler, MD;  Location: [place];  Service: Gastroenterology;  Laterality: N/A;   EXTRACORPOREAL SHOCK WAVE LITHOTRIPSY  1997   FOOT SURGERY Right 2013   hammertoe x2   HOLMIUM LASER APPLICATION Right 06/23/3150   Procedure: HOLMIUM LASER APPLICATION;  Surgeon: [name], MD;  Location: [facility];  Service: Urology;  Laterality: Right;   PERCUTANEOUS NEPHROSTOLITHOTOMY  1993   STONE EXTRACTION WITH BASKET Right 09/17/2016   Procedure: STONE EXTRACTION WITH BASKET;  Surgeon: [name], MD;  Location: [facility];  Service: Urology;  Laterality: Right;   TOTAL ABDOMINAL HYSTERECTOMY  1971   w/ Bilateral Salpingoophorectomy   TRANSTHORACIC ECHOCARDIOGRAM  10/24/2014   dr Martinique   hypokinesis of the basal and mid inferoseptal and inferior walls,  ef 40-45%,  grade 1 diastolic dysfunction/  mechanical bileaflet aortic valve noted w/ mild regur., peak grandient 46mHg, valve area 1.35cm^2/  severe MV calcification without stenosis w/ mild regurg., peak gradient 311mg/  mild LAE/  poss. atrial mass (MRI showed benign lipomatous hypertrophy of atrial septum) /tFrazier ButtR/mild TR     OB History   No obstetric history on file.     Family History  Problem Relation Age of Onset   Heart disease Mother    Heart disease Father [id number]     CABG   Other Brother        AVR   Healthy Child     Social History   Tobacco Use   Smoking status: Never   Smokeless tobacco: Never  Vaping Use   Vaping Use: Never used  Substance Use Topics   Alcohol use: No    Alcohol/week: 0.0 standard drinks   Drug use: No    Home Medications Prior to Admission medications   Medication Sig Start Date End Date Taking? Authorizing Provider  acetaminophen (TYLENOL) 500 MG tablet Take 1,000 mg by mouth every 6 (six) hours as needed (head  pain).     [provider]  amiodarone (PACERONE) 200 MG tablet Take 1 tablet by mouth once daily 05/12/21   SePatsey BertholdNP  amoxicillin (AMOXIL) 500 MG capsule Before dentist 03/03/21   [provider]  calcium carbonate (TUMS - DOSED IN MG ELEMENTAL CALCIUM) 500 MG chewable tablet Chew 1 tablet by mouth daily as needed for indigestion or heartburn.     [provider]  calcium citrate-vitamin D (CITRACAL+D) 315-200 MG-UNIT per tablet Take 1 tablet by mouth 3 (three) times daily.    [provider]  carbidopa-levodopa (SINEMET IR) 25-100 MG tablet Take 2 tablets by mouth 3 (three) times daily. 03/11/21   Tat, ReEustace QuailDO  Cholecalciferol (VITAMIN D3) 2000 units TABS Take 2,000 Units by mouth daily.     [provider]  COVID-19 mRNA Vac-TriS, Pfizer, (PFIZER-BIONT COVID-19 VAC-TRIS) SUSP injection Inject into the muscle. 08/04/21   SnCarlyle BasquesMD  famotidine-calcium carbonate-magnesium hydroxide (PEPCID COMPLETE) 10-800-165 MG CHEW chewable tablet Chew 1 tablet by mouth daily as needed (heartburn).    [provider]  fexofenadine (ALLEGRA) 60 MG tablet Take 60 mg by mouth daily.    [provider]  furosemide (LASIX) 20 MG tablet Take 1 tablet by mouth once daily 05/12/21   TaEvans LanceMD  KLOR-CON M20 20 MEQ tablet Take 20 mEq by mouth daily. 05/26/17   [provider]  Melatonin 3 MG TABS Take 3 mg by mouth at bedtime as needed (sleep).     [provider]  metoprolol tartrate (LOPRESSOR) 25 MG tablet Take 1 tablet (25 mg total) by mouth daily as needed (if heart rate is above 110). 11/13/20 11/08/21  JoMartiniquePeter M, MD  Multiple Vitamin (MULTIVITAMIN) tablet Take 1 tablet by mouth daily at 12 noon.    [provider]  pantoprazole (PROTONIX) 40 MG tablet Take 40 mg by mouth daily. 06/07/17   [provider]  Probiotic Product (PROBIOTIC-10 PO) Take 1 capsule by mouth daily.     [provider]  simvastatin (ZOCOR) 20 MG tablet Take 20 mg by mouth every evening.    [provider]  vitamin B-12 (CYANOCOBALAMIN) 1000 MCG tablet Take 1,000 mcg by mouth every morning.    [provider]  warfarin (COUMADIN) 5 MG tablet  Take 5 mg by mouth daily. Patient is taking 2.72m on Sunday, Tues, [place] Sat. Patient is taking 5 mg on Monday, Wed, Friday.    [provider]    Allergies    Demerol [meperidine], Oxycodone, Adhesive [tape], Ivp dye [iodinated diagnostic agents], and Phenergan [promethazine]  Review of Systems   Review of Systems  Constitutional:  Negative for chills and fever.  HENT:  Negative for congestion and rhinorrhea.   Eyes:  Negative for redness and visual disturbance.  Respiratory:  Negative for shortness of breath and wheezing.   Cardiovascular:  Negative for chest pain and palpitations.  Gastrointestinal:  Negative for nausea and vomiting.  Genitourinary:  Negative for dysuria and urgency.  Musculoskeletal:  Negative for arthralgias and myalgias.  Skin:  Positive for wound. Negative for pallor.  Neurological:  Positive for headaches. Negative for dizziness.   Physical Exam Updated Vital Signs BP (!) 199/172   Pulse (!) 54   Temp 98.1 F (36.7 C) (Oral)   Resp (!) 21   Ht _0  (1.499 m)   Wt 53.5 kg   SpO2 96%   BMI 23.83 kg/m   Physical Exam Vitals and nursing note reviewed.  Constitutional:      General: She is not in acute distress.    Appearance: She is well-developed. She is not diaphoretic.  HENT:     Head: Normocephalic.     Comments: Left laceration just above the eye brow Eyes:     Pupils: Pupils are equal, round, and reactive to light.  Cardiovascular:     Rate and Rhythm: Normal rate and regular rhythm.     Heart sounds: No murmur heard.   No friction rub. No gallop.  Pulmonary:     Effort: Pulmonary effort is normal.     Breath sounds: No wheezing or rales.  Abdominal:     General: There is  no distension.     Palpations: Abdomen is soft.     Tenderness: There is no abdominal tenderness.  Musculoskeletal:        General: No tenderness.     Cervical back: Normal range of motion and neck supple.     Comments: Palpated from head to toe without any noted bony tenderness.  Skin:    General: Skin is warm and dry.  Neurological:     Mental Status: She is alert and oriented to person, place, and time.  Psychiatric:        Behavior: Behavior normal.    ED Results / Procedures / Treatments   Labs (all labs ordered are listed, but only abnormal results are displayed) Labs Reviewed  PROTIME-INR - Abnormal; Notable for the following components:      Result Value   Prothrombin Time 39.3 (*)    INR 4.0 (*)    All other components within normal limits  CBC WITH DIFFERENTIAL/PLATELET - Abnormal; Notable for the following components:   RBC 3.33 (*)    Hemoglobin 11.5 (*)    HCT 35.0 (*)    MCV 105.1 (*)    MCH 34.5 (*)    All other components within normal limits  BASIC METABOLIC PANEL - Abnormal; Notable for the following components:   Glucose, Bld 101 (*)    BUN 28 (*)    Creatinine, Ser 2.38 (*)    GFR, Estimated 20 (*)    All other components within normal limits    EKG EKG Interpretation  Date/Time:  Thursday September 10 2021 17:53:28 EDT Ventricular Rate:  53 PR Interval:  231 QRS Duration: 155 QT Interval:  532 QTC Calculation: 500 R Axis:   178 Text Interpretation: Sinus rhythm Prolonged PR interval Nonspecific intraventricular conduction delay Probable anteroseptal infarct, recent Baseline wander TECHNICALLY DIFFICULT Otherwise no significant change Confirmed by Deno Etienne [phone]) on 09/10/2021 6:14:25 PM  Radiology CT HEAD WO CONTRAST (5MM)  Result Date: 09/10/2021 CLINICAL DATA:  Status post fall. EXAM: CT HEAD WITHOUT CONTRAST TECHNIQUE: Contiguous axial images were obtained from the base of the skull through the vertex without intravenous contrast.  COMPARISON:  June 17, 2020 FINDINGS: Brain: There is mild cerebral atrophy with widening of the extra-axial spaces and ventricular dilatation. There are areas of decreased attenuation within the white matter tracts of the supratentorial brain, consistent with microvascular disease changes. Vascular: No hyperdense vessel or unexpected calcification. Skull: Normal. Negative for fracture or focal lesion. Sinuses/Orbits: No acute finding. Other: None. IMPRESSION: 1. Mild cerebral atrophy and microvascular disease changes of the supratentorial brain. 2. No acute intracranial abnormality. Electronically Signed   By: Virgina Norfolk M.D.   On: 09/10/2021 19:03    Procedures .Marland KitchenLaceration Repair  Date/Time: 09/10/2021 7:57 PM Performed by: Deno Etienne, DO Authorized by: Deno Etienne, DO   Consent:    Consent obtained:  Verbal   Consent given by:  Patient   Risks, benefits, and alternatives were discussed: yes     Risks discussed:  Infection, poor cosmetic result, pain and poor wound healing   Alternatives discussed:  No treatment, delayed treatment and observation Universal protocol:    Patient identity confirmed:  Verbally with patient Anesthesia:    Anesthesia method:  Local infiltration   Local anesthetic:  Lidocaine 2% WITH epi Laceration details:    Location:  Face   Face location:  L eyebrow   Length (cm):  2.7 Pre-procedure details:    Preparation:  Patient was prepped and draped in usual sterile fashion Exploration:    Limited defect created (wound extended): no     Hemostasis achieved with:  Epinephrine and direct pressure   Imaging obtained comment:  CT   Imaging outcome: foreign body not noted     Wound exploration: entire depth of wound visualized     Contaminated: no   Treatment:    Area cleansed with:  Saline   Amount of cleaning:  Standard   Irrigation solution:  Sterile saline   Irrigation volume:  50   Irrigation method:  Syringe   Visualized foreign bodies/material  removed: no     Debridement:  None   Undermining:  None   Scar revision: no   Skin repair:    Repair method:  Sutures   Suture size:  5-0   Suture material:  Fast-absorbing gut   Suture technique:  Simple interrupted   Number of sutures:  4 Approximation:    Approximation:  Close Repair type:    Repair type:  Simple Post-procedure details:    Dressing:  Antibiotic ointment and adhesive bandage   Procedure completion:  Tolerated well, no immediate complications   Medications Ordered in ED Medications  lidocaine-EPINEPHrine (XYLOCAINE W/EPI) 2 %-1:200000 (PF) injection 10 mL (has no administration in time range)    ED Course  I have reviewed the triage vital signs and the nursing notes.  Pertinent labs & imaging results that were available during my care of the patient were reviewed by me and considered in my medical decision making (see chart for details).    MDM Rules/Calculators/A&P  80 yo F with a chief complaints of a fall.  Patient was reaching for something and lost her balance and struck her head.  Patient wearing a helmet I suspect has difficulty with her balance at baseline.  Has a very small laceration above the left eyebrow.  We will obtain a CT scan of the head.  Check Coumadin level.  INR elevated at 4.  Will have her discuss with her family doc.   Wound repaired at bedside.  PCP follow up.  8:00 PM:  I have discussed the diagnosis/risks/treatment options with the patient and believe the pt to be eligible for discharge home to follow-up with PCP. We also discussed returning to the ED immediately if new or worsening sx occur. We discussed the sx which are most concerning (e.g., sudden worsening pain, fever, inability to tolerate by mouth) that necessitate immediate return. Medications administered to the patient during their visit and any new prescriptions provided to the patient are listed below.  Medications given during this  visit Medications  lidocaine-EPINEPHrine (XYLOCAINE W/EPI) 2 %-1:200000 (PF) injection 10 mL (has no administration in time range)     The patient appears reasonably screen and/or stabilized for discharge and I doubt any other medical condition or other [facility] requiring further screening, evaluation, or treatment in the ED at this time prior to discharge.   Final Clinical Impression(s) / ED Diagnoses Final diagnoses:  Fall, initial encounter  Facial laceration, initial encounter    Rx / DC Orders ED Discharge Orders     None        Deno Etienne, DO 09/10/21 2000

## 2021-09-10 NOTE — Discharge Instructions (Addendum)
Please call your Coumadin clinic and let them know that your level was 4 and see if they want to change any of your medications at home.  Please discuss with your family physician your immunizations and if you need your tetanus updated.  Please return to the emergency department for worsening headache confusion or vomiting.  Return for redness drainage or if you get a fever.  The sutures that were used are dissolvable that should dissolve between day 3 and day 5.  If they are still there then you can gently plucked them out with tweezers.  The area can get wet but not fully immersed underwater.  No scrubbing.  If you really want to clean it you can apply a half-and-half hydrogen peroxide solution with water on a Q-tip.  You can apply an ointment a couple times a day this could be as simple as Vaseline but could also be an antibiotic ointment if you wish..  Once it is healed please try to avoid prolonged sun exposure use sunscreen.

## 2021-09-10 NOTE — ED Triage Notes (Signed)
Pt c/o fall, lac above left eye. Currently on coumadin.

## 2021-09-15 DIAGNOSIS — W19XXXA Unspecified fall, initial encounter: Secondary | ICD-10-CM | POA: Diagnosis not present

## 2021-09-15 DIAGNOSIS — I480 Paroxysmal atrial fibrillation: Secondary | ICD-10-CM | POA: Diagnosis not present

## 2021-09-15 DIAGNOSIS — S0181XA Laceration without foreign body of other part of head, initial encounter: Secondary | ICD-10-CM | POA: Diagnosis not present

## 2021-09-15 DIAGNOSIS — Z952 Presence of prosthetic heart valve: Secondary | ICD-10-CM | POA: Diagnosis not present

## 2021-09-15 DIAGNOSIS — Z7901 Long term (current) use of anticoagulants: Secondary | ICD-10-CM | POA: Diagnosis not present

## 2021-09-15 NOTE — Progress Notes (Signed)
Assessment/Plan:   1.  PSP  -Patient understands differences between PSP and Parkinson's disease.  Understands that there is an increased risk of falls, aspiration as subsequent morbidity and mortality from this disease compared to Parkinson's disease.    -several falls (one serious)and again told her I don't want her walking AT ALL.    She has a motorized WC at home and encouraged her to use it  -continue carbidopa/levodopa 25/100, 2 po tid   2.  Neck pain with cervical dystonia  -This is actually improved with levodopa therapy.  -Her last MRI was in 2017 with evidence of severe C2/C3 left facet arthropathy with joint effusion and bone marrow edema.  She received injections at the time and pain improved.  3.  Cough, likely associated with reflux  -Follows with primary care.  Is on Pepcid and PPI.  Last modified barium swallow was in June, 2019 and was normal.  4.  A-fib  -on coumadin.  Given that she has been less than compliant with recommendations for not walking and recently had major fall with hit head (and INR of 4), I am going to talk with cardiology about her coumadin risks.   If I could get her to stay seated, then risks would be low but have had trouble with this.    Subjective:   [name] was seen today in follow up for PSP.  My previous records were reviewed prior to todays visit as well as outside records available to me.  This patient is accompanied in the office by her spouse who supplements the history.   Patient was in the emergency room in September 15 after a fall.  CT brain was done and was negative.  Her INR was elevated that day (4).  There was a few other "easy" falls per husband.  Husband states that she is usually good and doesn't usually get up without him but this time, he was talking with people in the kitchen and she got up without him.  Has chronic diplopia.  Some swallow trouble with big pills.  She is not using electric WC much.  Current  prescribed movement disorder medications: Carbidopa/levodopa 25/100, 2 tablets 3 times per day    ALLERGIES:   Allergies  Allergen Reactions   Demerol [Meperidine] Shortness Of Breath and Swelling   Oxycodone Other (See Comments)    Hallucinations    Adhesive [Tape] Other (See Comments)    Redness and skin tears   Ivp Dye [Iodinated Diagnostic Agents] Hives    OK with benadryl pre-med (50mg  one hour before receiving iodinated contrast agent)   Phenergan [Promethazine] Other (See Comments)    Avoids due to reaction with current medications    CURRENT MEDICATIONS:  Outpatient Encounter Medications as of 09/16/2021  Medication Sig   acetaminophen (TYLENOL) 500 MG tablet Take 1,000 mg by mouth every 6 (six) hours as needed (head pain).    amiodarone (PACERONE) 200 MG tablet Take 1 tablet by mouth once daily   amoxicillin (AMOXIL) 500 MG capsule Before dentist   calcium carbonate (TUMS - DOSED IN MG ELEMENTAL CALCIUM) 500 MG chewable tablet Chew 1 tablet by mouth daily as needed for indigestion or heartburn.    calcium citrate-vitamin D (CITRACAL+D) 315-200 MG-UNIT per tablet Take 1 tablet by mouth 3 (three) times daily.   carbidopa-levodopa (SINEMET IR) 25-100 MG tablet Take 2 tablets by mouth 3 (three) times daily.   Cholecalciferol (VITAMIN D3) 2000 units TABS Take 2,000 Units by  mouth daily.    COVID-19 mRNA Vac-TriS, Pfizer, (PFIZER-BIONT COVID-19 VAC-TRIS) SUSP injection Inject into the muscle.   famotidine-calcium carbonate-magnesium hydroxide (PEPCID COMPLETE) 10-800-165 MG CHEW chewable tablet Chew 1 tablet by mouth daily as needed (heartburn).   fexofenadine (ALLEGRA) 60 MG tablet Take 60 mg by mouth daily.   furosemide (LASIX) 20 MG tablet Take 1 tablet by mouth once daily   KLOR-CON M20 20 MEQ tablet Take 20 mEq by mouth daily.   Melatonin 3 MG TABS Take 3 mg by mouth at bedtime as needed (sleep).    metoprolol tartrate (LOPRESSOR) 25 MG tablet Take 1 tablet (25 mg total) by  mouth daily as needed (if heart rate is above 110).   Multiple Vitamin (MULTIVITAMIN) tablet Take 1 tablet by mouth daily at 12 noon.   pantoprazole (PROTONIX) 40 MG tablet Take 40 mg by mouth daily.   Probiotic Product (PROBIOTIC-10 PO) Take 1 capsule by mouth daily.    simvastatin (ZOCOR) 20 MG tablet Take 20 mg by mouth every evening.   vitamin B-12 (CYANOCOBALAMIN) 1000 MCG tablet Take 1,000 mcg by mouth every morning.   warfarin (COUMADIN) 5 MG tablet Take 5 mg by mouth daily. Patient is taking 2.5mg  on Sunday, Tues, [place]. Sat. Patient is taking 5 mg on Monday, Wed, Friday.   No facility-administered encounter medications on file as of 09/16/2021.    Objective:   PHYSICAL EXAMINATION:    VITALS:   Vitals:   09/16/21 0751  BP: 123/75  Pulse: 85  SpO2: 97%  Weight: 122 lb 12.8 oz (55.7 kg)  Height: 4\' 11"  (1.499 m)    GEN:  The patient appears stated age and is in NAD. HEENT:  Normocephalic. Small area of healing ecchymosis around the L eye.   Pt wearing helmet.  The mucous membranes are moist. The superficial temporal arteries are without ropiness or tenderness.  She is wearing a soft helmet. CV:  RRR Lungs:  CTAB Neck/HEME:  There are no carotid bruits bilaterally.  Neurological examination:  Orientation: The patient is alert and oriented x3. Cranial nerves: There is good facial symmetry with facial hypomimia. There has upgaze and downgaze paresis, mild.  She c/o diplopia with it.  The speech is fluent and clear. Soft palate rises symmetrically and there is no tongue deviation. Hearing is intact to conversational tone. Sensation: Sensation is intact to light touch throughout Motor: Strength is at least antigravity x4.  Movement examination: Tone: There is normal tone in the UE/LE Abnormal movements: none Coordination:  There is no decremation with RAM's, but she is slow Gait and Station: not tested  I have reviewed and interpreted the following labs independently     Chemistry      Component Value Date/Time   NA 138 09/10/2021 1807   NA 141 07/01/2020 0837   K 4.4 09/10/2021 1807   CL 100 09/10/2021 1807   CO2 29 09/10/2021 1807   BUN 28 (H) 09/10/2021 1807   BUN 32 (H) 07/01/2020 0837   CREATININE 2.38 (H) 09/10/2021 1807      Component Value Date/Time   CALCIUM 9.5 09/10/2021 1807   ALKPHOS 81 06/17/2020 0845   AST 53 (H) 06/17/2020 0845   ALT 36 06/17/2020 0845   BILITOT 0.7 06/17/2020 0845   BILITOT 0.4 04/01/2020 0849       Lab Results  Component Value Date   WBC 6.1 09/10/2021   HGB 11.5 (L) 09/10/2021   HCT 35.0 (L) 09/10/2021   MCV 105.1 (H)  09/10/2021   PLT 185 09/10/2021    Lab Results  Component Value Date   TSH 4.370 04/01/2020     Total time spent on today's visit was 35 minutes, including both face-to-face time and nonface-to-face time.  Time included that spent on review of records (prior notes available to me/labs/imaging if pertinent), discussing treatment and goals, answering patient's questions and coordinating care.  Cc:  Leanna Battles, MD

## 2021-09-16 ENCOUNTER — Ambulatory Visit: Payer: PPO | Admitting: Neurology

## 2021-09-16 ENCOUNTER — Other Ambulatory Visit: Payer: Self-pay

## 2021-09-16 VITALS — BP 123/75 | HR 85 | Ht 59.0 in | Wt 122.8 lb

## 2021-09-16 DIAGNOSIS — H532 Diplopia: Secondary | ICD-10-CM

## 2021-09-16 DIAGNOSIS — G231 Progressive supranuclear ophthalmoplegia [Steele-Richardson-Olszewski]: Secondary | ICD-10-CM

## 2021-09-22 DIAGNOSIS — G20 Parkinson's disease: Secondary | ICD-10-CM | POA: Diagnosis not present

## 2021-09-22 DIAGNOSIS — R0602 Shortness of breath: Secondary | ICD-10-CM | POA: Diagnosis not present

## 2021-10-13 DIAGNOSIS — Z7901 Long term (current) use of anticoagulants: Secondary | ICD-10-CM | POA: Diagnosis not present

## 2021-10-13 DIAGNOSIS — Z952 Presence of prosthetic heart valve: Secondary | ICD-10-CM | POA: Diagnosis not present

## 2021-10-13 DIAGNOSIS — Z23 Encounter for immunization: Secondary | ICD-10-CM | POA: Diagnosis not present

## 2021-10-13 DIAGNOSIS — I480 Paroxysmal atrial fibrillation: Secondary | ICD-10-CM | POA: Diagnosis not present

## 2021-10-25 ENCOUNTER — Other Ambulatory Visit: Payer: Self-pay | Admitting: Neurology

## 2021-10-25 DIAGNOSIS — G231 Progressive supranuclear ophthalmoplegia [Steele-Richardson-Olszewski]: Secondary | ICD-10-CM

## 2021-10-26 ENCOUNTER — Other Ambulatory Visit: Payer: Self-pay

## 2021-10-30 ENCOUNTER — Ambulatory Visit: Payer: PPO | Attending: Internal Medicine

## 2021-10-30 ENCOUNTER — Other Ambulatory Visit (HOSPITAL_BASED_OUTPATIENT_CLINIC_OR_DEPARTMENT_OTHER): Payer: Self-pay

## 2021-10-30 ENCOUNTER — Other Ambulatory Visit: Payer: Self-pay

## 2021-10-30 DIAGNOSIS — Z23 Encounter for immunization: Secondary | ICD-10-CM

## 2021-10-30 MED ORDER — PFIZER COVID-19 VAC BIVALENT 30 MCG/0.3ML IM SUSP
INTRAMUSCULAR | 0 refills | Status: DC
  Filled 2021-10-30: qty 0.3, 1d supply, fill #0

## 2021-10-30 NOTE — Progress Notes (Signed)
   Covid-19 Vaccination Clinic  Name:  [name]    MRN: [id number] DOB: [birth year]  10/30/2021  [name] was observed post Covid-19 immunization for 15 minutes without incident. She was provided with Vaccine Information Sheet and instruction to access the V-Safe system.   [name] was instructed to call 911 with any severe reactions post vaccine: Difficulty breathing  Swelling of face and throat  A fast heartbeat  A bad rash all over body  Dizziness and weakness   Immunizations Administered     Name Date Dose VIS Date Route   Pfizer Covid-19 Vaccine Bivalent Booster 10/30/2021  1:55 PM 0.3 mL 08/26/2021 Intramuscular   Manufacturer: [place]   Lot: [id number]   [place]: [phone]

## 2021-11-17 DIAGNOSIS — Z952 Presence of prosthetic heart valve: Secondary | ICD-10-CM | POA: Diagnosis not present

## 2021-11-17 DIAGNOSIS — I480 Paroxysmal atrial fibrillation: Secondary | ICD-10-CM | POA: Diagnosis not present

## 2021-11-17 DIAGNOSIS — Z7901 Long term (current) use of anticoagulants: Secondary | ICD-10-CM | POA: Diagnosis not present

## 2021-11-17 DIAGNOSIS — I358 Other nonrheumatic aortic valve disorders: Secondary | ICD-10-CM | POA: Diagnosis not present

## 2021-11-26 DIAGNOSIS — W010XXA Fall on same level from slipping, tripping and stumbling without subsequent striking against object, initial encounter: Secondary | ICD-10-CM | POA: Diagnosis not present

## 2021-11-26 DIAGNOSIS — R0781 Pleurodynia: Secondary | ICD-10-CM | POA: Diagnosis not present

## 2021-11-26 DIAGNOSIS — I480 Paroxysmal atrial fibrillation: Secondary | ICD-10-CM | POA: Diagnosis not present

## 2021-11-26 DIAGNOSIS — S2241XA Multiple fractures of ribs, right side, initial encounter for closed fracture: Secondary | ICD-10-CM | POA: Diagnosis not present

## 2021-11-26 DIAGNOSIS — R269 Unspecified abnormalities of gait and mobility: Secondary | ICD-10-CM | POA: Diagnosis not present

## 2021-11-26 DIAGNOSIS — M25551 Pain in right hip: Secondary | ICD-10-CM | POA: Diagnosis not present

## 2021-12-16 DIAGNOSIS — Z952 Presence of prosthetic heart valve: Secondary | ICD-10-CM | POA: Diagnosis not present

## 2021-12-16 DIAGNOSIS — Z7901 Long term (current) use of anticoagulants: Secondary | ICD-10-CM | POA: Diagnosis not present

## 2021-12-16 DIAGNOSIS — I480 Paroxysmal atrial fibrillation: Secondary | ICD-10-CM | POA: Diagnosis not present

## 2022-01-05 NOTE — Progress Notes (Signed)
Cardiology Office Note   Date:  01/19/2022   ID:  [name] Nov 27, 1941, MRN [id number]  PCP:  Christy Lopes, MD  Cardiologist:   Christy Sandstrom [place], MD   Chief Complaint  Patient presents with   Congestive Heart Failure      History of Present Illness: [name] is a 81 y.o. female seen for evaluation of CHF. She has a  h/o severe AS s/p SJM mech AVR in 2007. She also has a h/o PAF and is chronically anticoagulated with coumadin.  She has a history of bradycardia on beta blocker.   September 2017 following a urologic procedure  she developed abd pain, nausea, c/p, sob, and palpitations, prompting her to call EMS.  She was found to be in AF with RVR, which later converted to sinus rhythm.  She was admitted to [facility] and had mild troponin elevation.  She  underwent stress testing, which was low risk.  She was seen in October 2017 by Dr Caryl Comes who felt her Afib episodes were infrequent and recommended following for now. If episodes became more frequent then consider resumption of beta blocker or AAD without rate slowing effects such as Tikosyn.    She does have a history of atypical Parikinsons and progressive supranuclear palsy followed by Neurology.    Admitted 12/10-12/14/2019 for Afib RVR, chest pain, anemia (Hgb 5.6) following a fall/trauma, extensive hematoma left chest. She became volume overloaded post transfusion>>BiPAP>>diuresed, low-dose amio started for elevated HR.   Seen by Dr Lovena Le in March 2020 and appeared to be maintaining NSR on amiodarone. She has multiple falls.  She was seen in the ED on 09/09/19 with mechanical fall and facial laceration. CT negative for bleed. INR therapeutic. She denies any significant palpitations. No syncope. No chest pain or dyspnea.   When seen in [place] [facility] in March 2021 only noted 2 episodes of Afib lasting minutes. Seen in ED on March 29 following a fall. CT and Xrays negative.   She was seen  in the ED on  06/19/20 after a mechanical fall and scalp laceration. Head CT showed no bleeding. She complained of increased wheezing and cough. CXR showed CM with low lung volumes. No clear edema. BNP was elevated at 725. She was DC on lasix 20 mg daily and outpatient Echo ordered. This demonstrated significant decreased in EF to 30-35% down from 40-45% with global HK. AV prosthesis was functioning normally. Severe LAE. She was started on [facility] as outpatient. This resulted in low BP, diarrhea and HA and was discontinued.    She was seen by Dr Lovena Le in April. Maintaining NSR and amiodarone dose reduced to 200 mg daily except none on Sunday.   She was seen in the ED last September following a fall with a small laceration of the left eyebrow. She was wearing her helmet and CT head was negative for acute findings.   She is seen with her husband today. She did have a recent bronchitis. Was on antibiotics and has recovered. Reports she is going to have a dental extraction. Will give SBE prophylaxis. Denies any chest pain, dyspnea, edema, or palpitations. BP at home has been well controlled. Has had some "easy" falls but is very careful and uses walker at home.    Past Medical History:  Diagnosis Date   Arthropathy of cervical facet joint    C2-3   At risk for falls    BPV (benign positional vertigo)    Cancer ([place]) 2004  breast-lumpectomy,nodes ,radiation,chemo   Chronic neck pain    CKD (chronic kidney disease), stage II    Diverticulosis of colon    Elevated troponin    a. 08/2016 in setting of PAF;  b. 08/2016 low risk MV w/ fixed septal defect (LBBB), EF 53%.   GERD (gastroesophageal reflux disease)    Hiatal hernia    History of aortic valve stenosis    History of breast cancer per pt no recurrence   dx 2004-- Left breast DCIS (ER/PR+, HER2 negative) s/p  partial masectomy w/ sln bx and  chemoradiotion (completed 2004)   History of colon polyps    History of herpes zoster    History of kidney  stones    HTN (hypertension)    Hyperlipidemia    Hypertrophy of inter-atrial septum    a. 09/2014 Echo: ? of atrial mass; b. 10/2014 Cardiac MRI: no atrial septal mass - polypoid lipomatous hypertrophy of superior atrial septum noted-->Benign.   Irritable bowel syndrome    LBBB (left bundle branch block)    PAF (paroxysmal atrial fibrillation) (HCC)    a. CHA2DS2VASc = 4-->coumadin.   Parkinsonism [facility])    neurologist-  dr tat   Personal history of chemotherapy 2004   Personal history of radiation therapy 2004   PONV (postoperative nausea and vomiting)    PVC's (premature ventricular contractions)    Right ureteral stone    S/P aortic valve replacement with prosthetic valve    a. 04/13/2006 s/p St Jude mechnical AVR for severe AS;  b. 09/2014 Echo: EF 40-45%, gr1 DD, mild AI/MR, triv TR/PR.   SUI (stress urinary incontinence, female)    Symptomatic anemia    Venous varices      Past Surgical History:  Procedure Laterality Date   ANTERIOR AND POSTERIOR REPAIR  1990   cystocele/ rectocele   AORTIC VALVE REPLACEMENT  04/13/06   dr gerhardt   and Replacement Ascending Aorta (St. Jude mechanical prosthesis)   APPENDECTOMY  child 1950   BREAST EXCISIONAL BIOPSY Left    BREAST LUMPECTOMY Left 01/11/2003   malignant   BREAST SURGERY Left 2004   CARDIAC CATHETERIZATION  02-22-2001   dr [name]   minimal non-obstructive cad/  mild aortic stenosis and aortic root enlargement/  normal LVF, ef 65%   CARDIAC CATHETERIZATION  03-22-2006    dr [name]   no significant obstructive cad/  severe aortic stenosis and mild to moderate aortic root enlargement/  upper normal right heart pressures   CARDIOVASCULAR STRESS TEST  09-30-2011   dr [name]   lexiscan nuclear study w/ apical thinning  vs  small prior apical infarct w/ no significant ischemia/  hypokinesis of the distal septum and apex, ef 50%   CARPAL TUNNEL RELEASE Bilateral left 09-09-2004/  right 2007   CATARACT EXTRACTION W/ INTRAOCULAR  LENS  IMPLANT, BILATERAL  2015   COLONOSCOPY N/A 10/03/2014   Procedure: COLONOSCOPY;  Surgeon: [name], MD;  Location: [place];  Service: Endoscopy;  Laterality: N/A;   CYSTOSCOPY WITH RETROGRADE PYELOGRAM, URETEROSCOPY AND STENT PLACEMENT Right 09/17/2016   Procedure: CYSTOSCOPY WITH RIGHT RETROGRADE PYELOGRAM, RIGHT URETEROSCOPY AND STENT PLACEMENT LASER LITHOTRIPSY, RIGHT STENT PLACEMENT;  Surgeon: [name], MD;  Location[facility];  Service: Urology;  Laterality: Right;   ESOPHAGEAL MANOMETRY N/A 08/12/2014   Procedure: ESOPHAGEAL MANOMETRY (EM);  Surgeon: [name], MD;  Location: [facility];  Service: Endoscopy;  Laterality: N/A;   ESOPHAGOGASTRODUODENOSCOPY N/A 10/03/2014   Procedure: ESOPHAGOGASTRODUODENOSCOPY (  EGD);  Surgeon: [name], MD;  Location: [place];  Service: Endoscopy;  Laterality: N/A;   ESOPHAGOGASTRODUODENOSCOPY N/A 12/08/2018   Procedure: ESOPHAGOGASTRODUODENOSCOPY (EGD);  Surgeon: Doran Stabler, MD;  Location: [place];  Service: Gastroenterology;  Laterality: N/A;   EXTRACORPOREAL SHOCK WAVE LITHOTRIPSY  1997   FOOT SURGERY Right 2013   hammertoe x2   HOLMIUM LASER APPLICATION Right 8/46/9629   Procedure: HOLMIUM LASER APPLICATION;  Surgeon: [name], MD;  Location[facility];  Service: Urology;  Laterality: Right;   PERCUTANEOUS NEPHROSTOLITHOTOMY  1993   STONE EXTRACTION WITH BASKET Right 09/17/2016   Procedure: STONE EXTRACTION WITH BASKET;  Surgeon: [name], MD;  Location: [facility];  Service: Urology;  Laterality: Right;   TOTAL ABDOMINAL HYSTERECTOMY  1971   w/ Bilateral Salpingoophorectomy   TRANSTHORACIC ECHOCARDIOGRAM  10/24/2014   dr [name]   hypokinesis of the basal and mid inferoseptal and inferior walls,  ef 40-45%,  grade 1 diastolic dysfunction/  mechanical bileaflet aortic valve noted w/ mild regur., peak grandient 58mHg, valve area  1.35cm^2/  severe MV calcification without stenosis w/ mild regurg., peak gradient 3612mg/  mild LAE/  poss. atrial mass (MRI showed benign lipomatous hypertrophy of atrial septum) /tFrazier ButtR/mild TR     Current Outpatient Medications  Medication Sig Dispense Refill   acetaminophen (TYLENOL) 500 MG tablet Take 1,000 mg by mouth every 6 (six) hours as needed (head pain).      amiodarone (PACERONE) 200 MG tablet Take 1 tablet by mouth once daily 90 tablet 3   amoxicillin (AMOXIL) 500 MG capsule Before dentist     calcium carbonate (TUMS - DOSED IN MG ELEMENTAL CALCIUM) 500 MG chewable tablet Chew 1 tablet by mouth daily as needed for indigestion or heartburn.      calcium citrate-vitamin D (CITRACAL+D) 315-200 MG-UNIT per tablet Take 1 tablet by mouth 3 (three) times daily.     carbidopa-levodopa (SINEMET IR) 25-100 MG tablet TAKE 2 TABLETS BY MOUTH THREE TIMES DAILY 540 tablet 0   Cholecalciferol (VITAMIN D3) 2000 units TABS Take 2,000 Units by mouth daily.      COVID-19 mRNA bivalent vaccine, Pfizer, (PFIZER COVID-19 VAC BIVALENT) injection Inject into the muscle. 0.3 mL 0   COVID-19 mRNA Vac-TriS, Pfizer, (PFIZER-BIONT COVID-19 VAC-TRIS) SUSP injection Inject into the muscle. 0.3 mL 0   famotidine-calcium carbonate-magnesium hydroxide (PEPCID COMPLETE) 10-800-165 MG CHEW chewable tablet Chew 1 tablet by mouth daily as needed (heartburn).     fexofenadine (ALLEGRA) 60 MG tablet Take 60 mg by mouth daily.     furosemide (LASIX) 20 MG tablet Take 1 tablet by mouth once daily 90 tablet 3   KLOR-CON M20 20 MEQ tablet Take 20 mEq by mouth daily.     Melatonin 3 MG TABS Take 3 mg by mouth at bedtime as needed (sleep).      Multiple Vitamin (MULTIVITAMIN) tablet Take 1 tablet by mouth daily at 12 noon.     pantoprazole (PROTONIX) 40 MG tablet Take 40 mg by mouth daily.     Probiotic Product (PROBIOTIC-10 PO) Take 1 capsule by mouth daily.      simvastatin (ZOCOR) 20 MG tablet Take 20 mg by mouth  every evening.     vitamin B-12 (CYANOCOBALAMIN) 1000 MCG tablet Take 1,000 mcg by mouth every morning.     warfarin (COUMADIN) 5 MG tablet Take 5 mg by mouth daily. Patient is taking 2.12m312mn Sunday, Tues, [place]. Patient  is taking 5 mg on Monday, Wed, Friday.     metoprolol tartrate (LOPRESSOR) 25 MG tablet Take 1 tablet (25 mg total) by mouth daily as needed (if heart rate is above 110). 90 tablet 3   No current facility-administered medications for this visit.    Allergies:   Demerol [meperidine], Oxycodone, Adhesive [tape], Ivp dye [iodinated contrast media], and Phenergan [promethazine]    Social History:  The patient  reports that she has never smoked. She has never used smokeless tobacco. She reports that she does not drink alcohol and does not use drugs.   Family History:  The patient's family history includes Healthy in her child; Heart disease in her mother; Heart disease (age of onset: 74) in her father; Other in her brother.    ROS:  Please see the history of present illness.   Otherwise, review of systems are positive for none.   All other systems are reviewed and negative.    PHYSICAL EXAM: VS:  BP 128/62    Pulse 60    Ht 4' 11.75" (1.518 m)    Wt 123 lb 6.4 oz (56 kg)    SpO2 98%    BMI 24.30 kg/m  , BMI Body mass index is 24.3 kg/m. GEN: Well nourished, well developed, in no acute distress  HEENT: normal  Neck: no JVD, carotid bruits, or masses Cardiac: RRR; good mechanical AV click. SEM RUSB Respiratory:  clear to auscultation bilaterally, normal work of breathing GI: soft, nontender, nondistended, + BS MS: no deformity or atrophy  Skin: warm and dry, no rash Neuro:  Strength and sensation are intact Psych: euthymic mood, full affect   EKG:  EKG is not ordered today. The ekg ordered today demonstrates N/A   Recent Labs: 09/10/2021: BUN 28; Creatinine, Ser 2.38; Hemoglobin 11.5; Platelets 185; Potassium 4.4; Sodium 138    Lipid Panel    Component Value  Date/Time   CHOL 182 04/01/2020 0849   TRIG 92 04/01/2020 0849   HDL 89 04/01/2020 0849   CHOLHDL 2.0 04/01/2020 0849   CHOLHDL 1.9 09/30/2011 0415   VLDL 8 09/30/2011 0415   LDLCALC 77 04/01/2020 0849    Dated 06/12/19: cholesterol 147, triglycerides 74, HDL 64, LDL 68.  Dated 06/23/21: cholesterol 160, triglycerides 71, HDL 70, LDL 76.   Wt Readings from Last 3 Encounters:  01/19/22 123 lb 6.4 oz (56 kg)  09/16/21 122 lb 12.8 oz (55.7 kg)  09/10/21 118 lb (53.5 kg)      Other studies Reviewed: Additional studies/ records that were reviewed today include:   11/17/18: TTE Study Conclusions - Left ventricle: The cavity size was normal. Wall thickness was   normal. Systolic function was mildly reduced. The estimated   ejection fraction was in the range of 45% to 50%. Features are   consistent with a pseudonormal left ventricular filling pattern,   with concomitant abnormal relaxation and increased filling   pressure (grade 2 diastolic dysfunction). - Aortic valve: A mechanical prosthesis was present. There was mild   regurgitation. Valve area (VTI): 1.43 cm^2. Valve area (Vmax):   1.28 cm^2. Valve area (Vmean): 1.2 cm^2. - Mitral valve: Mildly to moderately calcified annulus. Valve area   by continuity equation (using LVOT flow): 1.55 cm^2. - Left atrium: The atrium was mildly dilated.     09/21/18 stress myoview IMPRESSION: 1. No evidence reversible ischemia. Fixed defect in the septal wall with differential including infarction versus artifact from LEFT bundle branch block. 2. Septal dyskinesia in  patient with LEFT bundle branch block. 3. Left ventricular ejection fraction 53% 4. Non invasive risk stratification*: Low  Echo 06/20/20: IMPRESSIONS     1. Moderate global reduction in LV systolic function; s/p AVR with mild  AI and mean gradient 12 mmHg; biatrial enlargement; mild TR.   2. Left ventricular ejection fraction, by estimation, is 30 to 35%. The  left  ventricle has moderately decreased function. The left ventricle  demonstrates global hypokinesis. Left ventricular diastolic parameters are  consistent with Grade II diastolic  dysfunction (pseudonormalization).   3. Right ventricular systolic function is normal. The right ventricular  size is normal. Tricuspid regurgitation signal is inadequate for assessing  PA pressure.   4. Left atrial size was severely dilated.   5. Right atrial size was mildly dilated.   6. The mitral valve is normal in structure. Trivial mitral valve  regurgitation. No evidence of mitral stenosis.   7. The aortic valve has been repaired/replaced. Aortic valve  regurgitation is mild. No aortic stenosis is present. There is a unknown  St. Jude mechanical valve present in the aortic position. Procedure Date:  04/13/2006.    ASSESSMENT AND PLAN:  1. Chronic combined systolic/diastolic CHF. EF down to 30-35%. Will continue lasix 20 mg daily. Intolerant of Entresto with hypotension and CKD. intolerant of metoprolol due to bradycardia. Continue Sodium restriction.   2.  PAF/Tachy-brady syndrome:  Pt is now on amiodarone and appears to be maintaining NSR well.  Previously she had history of severe brady on beta blockers. This will limit titration of beta blocker. Amiodarone dose reduced in April skipping Sunday dose. Will update labs today with CMET and TFTs   2.  S/P SJM AVR:  Stable on echo in September 2019.  continue Chronic coumadin. SBE prophylaxis. Good valve click on exam.    3.  Essential HTN:  Controlled   4.  HLD:  well controlled. Cont statin.   5. CKD stage 3. Last creatinine 2.38.     Disposition:   FU with me in 6 months  Signed, Amantha Sklar [place], MD  01/19/2022 9:30 AM    [facility] [address] [place], [place], [address] Phone [phone], Fax [phone]

## 2022-01-06 DIAGNOSIS — Z954 Presence of other heart-valve replacement: Secondary | ICD-10-CM | POA: Diagnosis not present

## 2022-01-06 DIAGNOSIS — R051 Acute cough: Secondary | ICD-10-CM | POA: Diagnosis not present

## 2022-01-06 DIAGNOSIS — N184 Chronic kidney disease, stage 4 (severe): Secondary | ICD-10-CM | POA: Diagnosis not present

## 2022-01-06 DIAGNOSIS — R5383 Other fatigue: Secondary | ICD-10-CM | POA: Diagnosis not present

## 2022-01-06 DIAGNOSIS — Z7901 Long term (current) use of anticoagulants: Secondary | ICD-10-CM | POA: Diagnosis not present

## 2022-01-06 DIAGNOSIS — Z1152 Encounter for screening for COVID-19: Secondary | ICD-10-CM | POA: Diagnosis not present

## 2022-01-06 DIAGNOSIS — R0981 Nasal congestion: Secondary | ICD-10-CM | POA: Diagnosis not present

## 2022-01-12 DIAGNOSIS — I480 Paroxysmal atrial fibrillation: Secondary | ICD-10-CM | POA: Diagnosis not present

## 2022-01-12 DIAGNOSIS — Z7901 Long term (current) use of anticoagulants: Secondary | ICD-10-CM | POA: Diagnosis not present

## 2022-01-12 DIAGNOSIS — Z952 Presence of prosthetic heart valve: Secondary | ICD-10-CM | POA: Diagnosis not present

## 2022-01-19 ENCOUNTER — Other Ambulatory Visit: Payer: Self-pay

## 2022-01-19 ENCOUNTER — Ambulatory Visit: Payer: PPO | Admitting: Cardiology

## 2022-01-19 ENCOUNTER — Encounter: Payer: Self-pay | Admitting: Cardiology

## 2022-01-19 VITALS — BP 128/62 | HR 60 | Ht 59.75 in | Wt 123.4 lb

## 2022-01-19 DIAGNOSIS — I480 Paroxysmal atrial fibrillation: Secondary | ICD-10-CM | POA: Diagnosis not present

## 2022-01-19 DIAGNOSIS — Z7901 Long term (current) use of anticoagulants: Secondary | ICD-10-CM

## 2022-01-19 DIAGNOSIS — Z952 Presence of prosthetic heart valve: Secondary | ICD-10-CM

## 2022-01-19 DIAGNOSIS — I495 Sick sinus syndrome: Secondary | ICD-10-CM

## 2022-01-19 DIAGNOSIS — I10 Essential (primary) hypertension: Secondary | ICD-10-CM

## 2022-01-19 LAB — COMPREHENSIVE METABOLIC PANEL
ALT: 6 IU/L (ref 0–32)
AST: 32 IU/L (ref 0–40)
Albumin/Globulin Ratio: 1.4 (ref 1.2–2.2)
Albumin: 3.6 g/dL (ref 3.6–4.6)
Alkaline Phosphatase: 95 IU/L (ref 44–121)
BUN/Creatinine Ratio: 16 (ref 12–28)
BUN: 24 mg/dL (ref 8–27)
Bilirubin Total: 0.4 mg/dL (ref 0.0–1.2)
CO2: 29 mmol/L (ref 20–29)
Calcium: 9.6 mg/dL (ref 8.7–10.3)
Chloride: 99 mmol/L (ref 96–106)
Creatinine, Ser: 1.54 mg/dL — ABNORMAL HIGH (ref 0.57–1.00)
Globulin, Total: 2.5 g/dL (ref 1.5–4.5)
Glucose: 98 mg/dL (ref 70–99)
Potassium: 4.1 mmol/L (ref 3.5–5.2)
Sodium: 138 mmol/L (ref 134–144)
Total Protein: 6.1 g/dL (ref 6.0–8.5)
eGFR: 34 mL/min/{1.73_m2} — ABNORMAL LOW (ref >59)

## 2022-01-19 LAB — T4, FREE: Free T4: 1.89 ng/dL — ABNORMAL HIGH (ref 0.82–1.77)

## 2022-01-19 LAB — TSH: TSH: 3.06 u[IU]/mL (ref 0.450–4.500)

## 2022-01-20 ENCOUNTER — Other Ambulatory Visit: Payer: Self-pay

## 2022-01-20 DIAGNOSIS — Z79899 Other long term (current) drug therapy: Secondary | ICD-10-CM

## 2022-01-20 DIAGNOSIS — I480 Paroxysmal atrial fibrillation: Secondary | ICD-10-CM

## 2022-01-21 ENCOUNTER — Other Ambulatory Visit: Payer: Self-pay | Admitting: Neurology

## 2022-01-21 DIAGNOSIS — G231 Progressive supranuclear ophthalmoplegia [Steele-Richardson-Olszewski]: Secondary | ICD-10-CM

## 2022-01-21 DIAGNOSIS — Z993 Dependence on wheelchair: Secondary | ICD-10-CM | POA: Diagnosis not present

## 2022-01-21 DIAGNOSIS — I4891 Unspecified atrial fibrillation: Secondary | ICD-10-CM | POA: Diagnosis not present

## 2022-01-21 DIAGNOSIS — Z9181 History of falling: Secondary | ICD-10-CM | POA: Diagnosis not present

## 2022-01-22 ENCOUNTER — Other Ambulatory Visit: Payer: Self-pay

## 2022-02-05 ENCOUNTER — Encounter (HOSPITAL_COMMUNITY): Payer: Self-pay | Admitting: Emergency Medicine

## 2022-02-05 ENCOUNTER — Emergency Department (HOSPITAL_COMMUNITY): Payer: PPO

## 2022-02-05 ENCOUNTER — Other Ambulatory Visit: Payer: Self-pay

## 2022-02-05 ENCOUNTER — Inpatient Hospital Stay (HOSPITAL_COMMUNITY)
Admission: EM | Admit: 2022-02-05 | Discharge: 2022-02-13 | DRG: 556 | Disposition: A | Discharge status: Home Health | Payer: PPO | Attending: Internal Medicine | Admitting: Internal Medicine

## 2022-02-05 DIAGNOSIS — L8990 Pressure ulcer of unspecified site, unspecified stage: Secondary | ICD-10-CM | POA: Insufficient documentation

## 2022-02-05 DIAGNOSIS — I130 Hypertensive heart and chronic kidney disease with heart failure and stage 1 through stage 4 chronic kidney disease, or unspecified chronic kidney disease: Secondary | ICD-10-CM | POA: Diagnosis present

## 2022-02-05 DIAGNOSIS — I517 Cardiomegaly: Secondary | ICD-10-CM | POA: Diagnosis not present

## 2022-02-05 DIAGNOSIS — Z853 Personal history of malignant neoplasm of breast: Secondary | ICD-10-CM

## 2022-02-05 DIAGNOSIS — K5900 Constipation, unspecified: Secondary | ICD-10-CM | POA: Diagnosis not present

## 2022-02-05 DIAGNOSIS — I10 Essential (primary) hypertension: Secondary | ICD-10-CM | POA: Diagnosis present

## 2022-02-05 DIAGNOSIS — Z952 Presence of prosthetic heart valve: Secondary | ICD-10-CM | POA: Diagnosis not present

## 2022-02-05 DIAGNOSIS — N179 Acute kidney failure, unspecified: Secondary | ICD-10-CM | POA: Diagnosis not present

## 2022-02-05 DIAGNOSIS — G8929 Other chronic pain: Secondary | ICD-10-CM | POA: Diagnosis present

## 2022-02-05 DIAGNOSIS — K219 Gastro-esophageal reflux disease without esophagitis: Secondary | ICD-10-CM | POA: Diagnosis present

## 2022-02-05 DIAGNOSIS — Z888 Allergy status to other drugs, medicaments and biological substances status: Secondary | ICD-10-CM | POA: Diagnosis not present

## 2022-02-05 DIAGNOSIS — S3011XA Contusion of abdominal wall, initial encounter: Secondary | ICD-10-CM | POA: Diagnosis present

## 2022-02-05 DIAGNOSIS — Z20822 Contact with and (suspected) exposure to covid-19: Secondary | ICD-10-CM | POA: Diagnosis present

## 2022-02-05 DIAGNOSIS — M47812 Spondylosis without myelopathy or radiculopathy, cervical region: Secondary | ICD-10-CM | POA: Diagnosis present

## 2022-02-05 DIAGNOSIS — N183 Chronic kidney disease, stage 3 unspecified: Secondary | ICD-10-CM | POA: Diagnosis present

## 2022-02-05 DIAGNOSIS — R109 Unspecified abdominal pain: Secondary | ICD-10-CM | POA: Diagnosis not present

## 2022-02-05 DIAGNOSIS — I480 Paroxysmal atrial fibrillation: Secondary | ICD-10-CM | POA: Diagnosis present

## 2022-02-05 DIAGNOSIS — Z91048 Other nonmedicinal substance allergy status: Secondary | ICD-10-CM | POA: Diagnosis not present

## 2022-02-05 DIAGNOSIS — E875 Hyperkalemia: Secondary | ICD-10-CM | POA: Diagnosis present

## 2022-02-05 DIAGNOSIS — D631 Anemia in chronic kidney disease: Secondary | ICD-10-CM | POA: Diagnosis present

## 2022-02-05 DIAGNOSIS — D649 Anemia, unspecified: Secondary | ICD-10-CM | POA: Diagnosis present

## 2022-02-05 DIAGNOSIS — Z885 Allergy status to narcotic agent status: Secondary | ICD-10-CM

## 2022-02-05 DIAGNOSIS — G20 Parkinson's disease: Secondary | ICD-10-CM | POA: Diagnosis present

## 2022-02-05 DIAGNOSIS — Z91041 Radiographic dye allergy status: Secondary | ICD-10-CM | POA: Diagnosis not present

## 2022-02-05 DIAGNOSIS — I4891 Unspecified atrial fibrillation: Secondary | ICD-10-CM | POA: Diagnosis not present

## 2022-02-05 DIAGNOSIS — N390 Urinary tract infection, site not specified: Secondary | ICD-10-CM | POA: Diagnosis present

## 2022-02-05 DIAGNOSIS — Z923 Personal history of irradiation: Secondary | ICD-10-CM

## 2022-02-05 DIAGNOSIS — Z7901 Long term (current) use of anticoagulants: Secondary | ICD-10-CM

## 2022-02-05 DIAGNOSIS — Z9221 Personal history of antineoplastic chemotherapy: Secondary | ICD-10-CM

## 2022-02-05 DIAGNOSIS — E7800 Pure hypercholesterolemia, unspecified: Secondary | ICD-10-CM | POA: Diagnosis present

## 2022-02-05 DIAGNOSIS — Z9012 Acquired absence of left breast and nipple: Secondary | ICD-10-CM

## 2022-02-05 DIAGNOSIS — K449 Diaphragmatic hernia without obstruction or gangrene: Secondary | ICD-10-CM | POA: Diagnosis not present

## 2022-02-05 DIAGNOSIS — Z8249 Family history of ischemic heart disease and other diseases of the circulatory system: Secondary | ICD-10-CM

## 2022-02-05 DIAGNOSIS — M7981 Nontraumatic hematoma of soft tissue: Principal | ICD-10-CM | POA: Diagnosis present

## 2022-02-05 DIAGNOSIS — S301XXA Contusion of abdominal wall, initial encounter: Secondary | ICD-10-CM | POA: Diagnosis not present

## 2022-02-05 DIAGNOSIS — G231 Progressive supranuclear ophthalmoplegia [Steele-Richardson-Olszewski]: Secondary | ICD-10-CM | POA: Diagnosis not present

## 2022-02-05 DIAGNOSIS — N1832 Chronic kidney disease, stage 3b: Secondary | ICD-10-CM | POA: Diagnosis not present

## 2022-02-05 DIAGNOSIS — R079 Chest pain, unspecified: Secondary | ICD-10-CM | POA: Diagnosis not present

## 2022-02-05 DIAGNOSIS — Z79899 Other long term (current) drug therapy: Secondary | ICD-10-CM

## 2022-02-05 DIAGNOSIS — I700 Atherosclerosis of aorta: Secondary | ICD-10-CM | POA: Diagnosis not present

## 2022-02-05 LAB — COMPREHENSIVE METABOLIC PANEL
ALT: <5 U/L (ref 0–44)
AST: 39 U/L (ref 15–41)
Albumin: 3.3 g/dL — ABNORMAL LOW (ref 3.5–5.0)
Alkaline Phosphatase: 79 U/L (ref 38–126)
Anion gap: 9 (ref 5–15)
BUN: 28 mg/dL — ABNORMAL HIGH (ref 8–23)
CO2: 27 mmol/L (ref 22–32)
Calcium: 9.5 mg/dL (ref 8.9–10.3)
Chloride: 100 mmol/L (ref 98–111)
Creatinine, Ser: 1.94 mg/dL — ABNORMAL HIGH (ref 0.44–1.00)
GFR, Estimated: 26 mL/min — ABNORMAL LOW (ref >60)
Glucose, Bld: 99 mg/dL (ref 70–99)
Potassium: 4.7 mmol/L (ref 3.5–5.1)
Sodium: 136 mmol/L (ref 135–145)
Total Bilirubin: 1 mg/dL (ref 0.3–1.2)
Total Protein: 6 g/dL — ABNORMAL LOW (ref 6.5–8.1)

## 2022-02-05 LAB — CBC
HCT: 34.1 % — ABNORMAL LOW (ref 36.0–46.0)
Hemoglobin: 10.8 g/dL — ABNORMAL LOW (ref 12.0–15.0)
MCH: 32.7 pg (ref 26.0–34.0)
MCHC: 31.7 g/dL (ref 30.0–36.0)
MCV: 103.3 fL — ABNORMAL HIGH (ref 80.0–100.0)
Platelets: 211 10*3/uL (ref 150–400)
RBC: 3.3 MIL/uL — ABNORMAL LOW (ref 3.87–5.11)
RDW: 14.4 % (ref 11.5–15.5)
WBC: 6.5 10*3/uL (ref 4.0–10.5)
nRBC: 0 % (ref 0.0–0.2)

## 2022-02-05 LAB — URINALYSIS, ROUTINE W REFLEX MICROSCOPIC
Bilirubin Urine: NEGATIVE
Glucose, UA: NEGATIVE mg/dL
Ketones, ur: 5 mg/dL — AB
Nitrite: NEGATIVE
Protein, ur: 100 mg/dL — AB
RBC / HPF: >50 RBC/hpf — ABNORMAL HIGH (ref 0–5)
Specific Gravity, Urine: 1.017 (ref 1.005–1.030)
WBC, UA: >50 WBC/hpf — ABNORMAL HIGH (ref 0–5)
pH: 5 (ref 5.0–8.0)

## 2022-02-05 LAB — TROPONIN I (HIGH SENSITIVITY)
Troponin I (High Sensitivity): 23 ng/L — ABNORMAL HIGH (ref <18)
Troponin I (High Sensitivity): 23 ng/L — ABNORMAL HIGH (ref <18)

## 2022-02-05 LAB — PROTIME-INR
INR: 3.2 — ABNORMAL HIGH (ref 0.8–1.2)
Prothrombin Time: 32.4 seconds — ABNORMAL HIGH (ref 11.4–15.2)

## 2022-02-05 LAB — RESP PANEL BY RT-PCR (FLU A&B, COVID) ARPGX2
Influenza A by PCR: NEGATIVE
Influenza B by PCR: NEGATIVE
SARS Coronavirus 2 by RT PCR: NEGATIVE

## 2022-02-05 LAB — LIPASE, BLOOD: Lipase: 27 U/L (ref 11–51)

## 2022-02-05 MED ORDER — ONDANSETRON HCL 4 MG/2ML IJ SOLN
4.0000 mg | Freq: Four times a day (QID) | INTRAMUSCULAR | Status: DC | PRN
  Administered 2022-02-05 – 2022-02-09 (×4): 4 mg via INTRAVENOUS
  Filled 2022-02-05 (×4): qty 2

## 2022-02-05 MED ORDER — MORPHINE SULFATE (PF) 2 MG/ML IV SOLN
2.0000 mg | INTRAVENOUS | Status: DC | PRN
  Administered 2022-02-05 – 2022-02-09 (×6): 2 mg via INTRAVENOUS
  Filled 2022-02-05 (×6): qty 1

## 2022-02-05 MED ORDER — AMIODARONE HCL 200 MG PO TABS
200.0000 mg | ORAL_TABLET | ORAL | Status: DC
  Administered 2022-02-05 – 2022-02-13 (×8): 200 mg via ORAL
  Filled 2022-02-05 (×8): qty 1

## 2022-02-05 MED ORDER — BISACODYL 5 MG PO TBEC: 5.0000 mg | DELAYED_RELEASE_TABLET | Freq: Every day | ORAL | Status: DC | PRN

## 2022-02-05 MED ORDER — ACETAMINOPHEN 650 MG RE SUPP: 650.0000 mg | Freq: Four times a day (QID) | RECTAL | Status: DC | PRN

## 2022-02-05 MED ORDER — SODIUM CHLORIDE 0.9 % IV BOLUS
500.0000 mL | Freq: Once | INTRAVENOUS | Status: AC
  Administered 2022-02-05: 500 mL via INTRAVENOUS

## 2022-02-05 MED ORDER — CARBIDOPA-LEVODOPA 25-100 MG PO TABS
2.0000 | ORAL_TABLET | Freq: Three times a day (TID) | ORAL | Status: DC
  Administered 2022-02-05 – 2022-02-13 (×24): 2 via ORAL
  Filled 2022-02-05 (×24): qty 2

## 2022-02-05 MED ORDER — DOCUSATE SODIUM 100 MG PO CAPS
100.0000 mg | ORAL_CAPSULE | Freq: Two times a day (BID) | ORAL | Status: DC
  Administered 2022-02-05 – 2022-02-13 (×17): 100 mg via ORAL
  Filled 2022-02-05 (×17): qty 1

## 2022-02-05 MED ORDER — LORATADINE 10 MG PO TABS
10.0000 mg | ORAL_TABLET | Freq: Every day | ORAL | Status: DC
  Administered 2022-02-06 – 2022-02-13 (×8): 10 mg via ORAL
  Filled 2022-02-05 (×8): qty 1

## 2022-02-05 MED ORDER — WARFARIN - PHARMACIST DOSING INPATIENT
Freq: Every day | Status: DC
  Filled 2022-02-05: qty 1

## 2022-02-05 MED ORDER — SIMVASTATIN 20 MG PO TABS
20.0000 mg | ORAL_TABLET | Freq: Every evening | ORAL | Status: DC
  Administered 2022-02-05 – 2022-02-12 (×8): 20 mg via ORAL
  Filled 2022-02-05 (×8): qty 1

## 2022-02-05 MED ORDER — ONDANSETRON HCL 4 MG PO TABS: 4.0000 mg | ORAL_TABLET | Freq: Four times a day (QID) | ORAL | Status: DC | PRN

## 2022-02-05 MED ORDER — HYDROMORPHONE HCL 1 MG/ML IJ SOLN
0.5000 mg | Freq: Once | INTRAMUSCULAR | Status: AC
  Administered 2022-02-05: 0.5 mg via INTRAVENOUS
  Filled 2022-02-05: qty 1

## 2022-02-05 MED ORDER — METHOCARBAMOL 1000 MG/10ML IJ SOLN
500.0000 mg | Freq: Four times a day (QID) | INTRAVENOUS | Status: DC | PRN
  Administered 2022-02-07: 500 mg via INTRAVENOUS
  Filled 2022-02-05: qty 500
  Filled 2022-02-05: qty 5

## 2022-02-05 MED ORDER — ONDANSETRON HCL 4 MG/2ML IJ SOLN: 4.0000 mg | Freq: Once | INTRAMUSCULAR | Status: DC | PRN

## 2022-02-05 MED ORDER — POLYETHYLENE GLYCOL 3350 17 G PO PACK: 17.0000 g | PACK | Freq: Every day | ORAL | Status: DC | PRN

## 2022-02-05 MED ORDER — ACETAMINOPHEN 325 MG PO TABS
650.0000 mg | ORAL_TABLET | Freq: Four times a day (QID) | ORAL | Status: DC | PRN
  Administered 2022-02-06 – 2022-02-12 (×10): 650 mg via ORAL
  Filled 2022-02-05 (×11): qty 2

## 2022-02-05 MED ORDER — WARFARIN SODIUM 2.5 MG PO TABS
2.5000 mg | ORAL_TABLET | Freq: Once | ORAL | Status: AC
Start: 2022-02-05 — End: 2022-02-05
  Administered 2022-02-05: 2.5 mg via ORAL
  Filled 2022-02-05: qty 1

## 2022-02-05 MED ORDER — SODIUM CHLORIDE 0.9 % IV SOLN
1.0000 g | Freq: Once | INTRAVENOUS | Status: AC
  Administered 2022-02-05: 1 g via INTRAVENOUS
  Filled 2022-02-05: qty 10

## 2022-02-05 MED ORDER — HYDRALAZINE HCL 20 MG/ML IJ SOLN: 5.0000 mg | INTRAMUSCULAR | Status: DC | PRN

## 2022-02-05 MED ORDER — MELATONIN 5 MG PO TABS
5.0000 mg | ORAL_TABLET | Freq: Every day | ORAL | Status: DC
  Administered 2022-02-05 – 2022-02-12 (×8): 5 mg via ORAL
  Filled 2022-02-05 (×8): qty 1

## 2022-02-05 MED ORDER — PANTOPRAZOLE SODIUM 40 MG PO TBEC
40.0000 mg | DELAYED_RELEASE_TABLET | Freq: Every day | ORAL | Status: DC
  Administered 2022-02-06 – 2022-02-13 (×8): 40 mg via ORAL
  Filled 2022-02-05 (×8): qty 1

## 2022-02-05 NOTE — Assessment & Plan Note (Signed)
-  She needs ongoing AC due to her valve replacement -Current INR is 3.2 with goal range 2.5-3.5 -Would shoot for closer to 2.5 to allow for healing of the hematoma -Pharmacy to dose and is aware of goal

## 2022-02-05 NOTE — ED Triage Notes (Signed)
Pt reported to ED with c/o left sided abdominal pain that radiates from under breast bone downwards towards hip. Pt reports pain has been ongoing x past 3 days, states she has had intermitted nausea as well. Denies any ShOB,CP or urinary issues.

## 2022-02-05 NOTE — Assessment & Plan Note (Signed)
-  Possible UTI vs. Asymptomatic bacteriuria - patient and husband report no significant change from baseline -UA mildly abnormal but may be c/w dehydration -She was given 1 dose of Rocephin -Will not continue Rocephin at this time -Urine culture is pending

## 2022-02-05 NOTE — Assessment & Plan Note (Signed)
-  Stage 3b/IV CKD -Appears to be stable at this time -Will trend

## 2022-02-05 NOTE — ED Notes (Signed)
Patient transported to CT 

## 2022-02-05 NOTE — ED Notes (Signed)
Pt also endorses pain to left chest that radiates downwards. Has hx of aortic valve replacement and takes coumadin.

## 2022-02-05 NOTE — Assessment & Plan Note (Signed)
-  She is not on chronic meds for this and just takes prn lopressor for HR -Will order prn IV hydralazine

## 2022-02-05 NOTE — Assessment & Plan Note (Signed)
-  Essentially with Parkinsonism -Continue Sinemet

## 2022-02-05 NOTE — Assessment & Plan Note (Signed)
Continue Zocor 

## 2022-02-05 NOTE — Assessment & Plan Note (Addendum)
-  Patient with superficial abdominal muscle hematoma -This may have been traumatic, from mild overexertion, or spontaneous in the setting of AC -Regardless, she must remain on AC -Hgb is slightly lower than prior, will trend -Will observe to ensure symptoms are not worsening -PT/OT consults requested -Surgery was consulted but did not see an indication for intervention -If symptoms are worsening, repeat imaging may be warranted -If improving, she is likely ok for dc to home -Reabsorption of the hematoma is likely to take quite some time - particularly in light of ongoing need for [facility]

## 2022-02-05 NOTE — ED Provider Notes (Signed)
[facility] EMERGENCY DEPARTMENT Provider Note   CSN: [id number] Arrival date & time: 02/05/22  2426     History  Chief Complaint  Patient presents with   Abdominal Pain   Chest Pain    [name] is a 81 y.o. female.  HPI 81 year old female with a history of chronic kidney disease, paroxysmal A-fib on Coumadin, aortic valve replacement, diverticulosis, CHF, and Parkinson's presents with severe left-sided abdominal pain.  The husband reports that she has been having this pain for about 3 days but got worse last night so they brought her in today.  Patient tells me it feels like a burning and is holding the left upper quadrant under the left ribs.  No significant chest pain and no shortness of breath.  No fevers during this time.  There is no back pain but the pain feels like it goes from the left upper quadrant down towards her left hip.  She has some diarrhea before this started but now has normal bowel movements.  She chronically has increased urination and maybe it is a little worse than typical.  However no dysuria.  Has tried Tylenol without relief.  Pain is rated as severe.  Home Medications Prior to Admission medications   Medication Sig Start Date End Date Taking? Authorizing Provider  carbidopa-levodopa (SINEMET IR) 25-100 MG tablet TAKE 2 TABLETS BY MOUTH THREE TIMES DAILY 01/22/22   Tat, Eustace Quail, DO  acetaminophen (TYLENOL) 500 MG tablet Take 1,000 mg by mouth every 6 (six) hours as needed (head pain).     [provider]  amiodarone (PACERONE) 200 MG tablet Take 1 tablet by mouth once daily 05/12/21   Patsey Berthold, NP  amoxicillin (AMOXIL) 500 MG capsule Before dentist 03/03/21   [provider]  calcium carbonate (TUMS - DOSED IN MG ELEMENTAL CALCIUM) 500 MG chewable tablet Chew 1 tablet by mouth daily as needed for indigestion or heartburn.     [provider]  calcium citrate-vitamin D (CITRACAL+D) 315-200 MG-UNIT  per tablet Take 1 tablet by mouth 3 (three) times daily.    [provider]  Cholecalciferol (VITAMIN D3) 2000 units TABS Take 2,000 Units by mouth daily.     [provider]  COVID-19 mRNA bivalent vaccine, Pfizer, (PFIZER COVID-19 VAC BIVALENT) injection Inject into the muscle. 10/30/21   Carlyle Basques, MD  COVID-19 mRNA Vac-TriS, Pfizer, (PFIZER-BIONT COVID-19 VAC-TRIS) SUSP injection Inject into the muscle. 08/04/21   Carlyle Basques, MD  famotidine-calcium carbonate-magnesium hydroxide (PEPCID COMPLETE) 10-800-165 MG CHEW chewable tablet Chew 1 tablet by mouth daily as needed (heartburn).    [provider]  fexofenadine (ALLEGRA) 60 MG tablet Take 60 mg by mouth daily.    [provider]  furosemide (LASIX) 20 MG tablet Take 1 tablet by mouth once daily 05/12/21   Evans Lance, MD  KLOR-CON M20 20 MEQ tablet Take 20 mEq by mouth daily. 05/26/17   [provider]  Melatonin 3 MG TABS Take 3 mg by mouth at bedtime as needed (sleep).     [provider]  metoprolol tartrate (LOPRESSOR) 25 MG tablet Take 1 tablet (25 mg total) by mouth daily as needed (if heart rate is above 110). 11/13/20 11/08/21  Martinique, Peter M, MD  Multiple Vitamin (MULTIVITAMIN) tablet Take 1 tablet by mouth daily at 12 noon.    [provider]  pantoprazole (PROTONIX) 40 MG tablet Take 40 mg by mouth daily. 06/07/17   [provider]  Probiotic Product (PROBIOTIC-10 PO) Take 1 capsule by mouth daily.     [provider]  simvastatin (ZOCOR) 20 MG tablet Take 20 mg by mouth every evening.    [provider]  vitamin B-12 (CYANOCOBALAMIN) 1000 MCG tablet Take 1,000 mcg by mouth every morning.    [provider]  warfarin (COUMADIN) 5 MG tablet Take 5 mg by mouth daily. Patient is taking 2.5mg  on Sunday, Tues, [place]. Sat. Patient is taking 5 mg on Monday, Wed, Friday.    [provider]      Allergies    Demerol  [meperidine], Oxycodone, Adhesive [tape], Ivp dye [iodinated contrast media], and Phenergan [promethazine]    Review of Systems   Review of Systems  Constitutional:  Negative for fever.  Respiratory:  Negative for cough and shortness of breath.   Cardiovascular:  Negative for chest pain.  Gastrointestinal:  Positive for abdominal pain and nausea. Negative for constipation and vomiting.  Genitourinary:  Positive for frequency. Negative for dysuria.  Musculoskeletal:  Negative for back pain.   Physical Exam Updated Vital Signs BP (!) 170/75    Pulse 65    Temp 98.1 F (36.7 C) (Oral)    Resp 18    Ht 4\' 11"  (1.499 m)    Wt 54 kg    SpO2 96%    BMI 24.04 kg/m  Physical Exam Vitals and nursing note reviewed.  Constitutional:      Appearance: She is well-developed. She is not diaphoretic.  HENT:     Head: Normocephalic and atraumatic.  Cardiovascular:     Rate and Rhythm: Normal rate and regular rhythm.     Heart sounds: Murmur heard.  Pulmonary:     Effort: Pulmonary effort is normal.     Breath sounds: Normal breath sounds.  Abdominal:     Palpations: Abdomen is soft.     Tenderness: There is abdominal tenderness in the left upper quadrant and left lower quadrant. There is no right CVA tenderness or left CVA tenderness.  Skin:    General: Skin is warm and dry.  Neurological:     Mental Status: She is alert.    ED Results / Procedures / Treatments   Labs (all labs ordered are listed, but only abnormal results are displayed) Labs Reviewed  COMPREHENSIVE METABOLIC PANEL - Abnormal; Notable for the following components:      Result Value   BUN 28 (*)    Creatinine, Ser 1.94 (*)    Total Protein 6.0 (*)    Albumin 3.3 (*)    GFR, Estimated 26 (*)    All other components within normal limits  CBC - Abnormal; Notable for the following components:   RBC 3.30 (*)    Hemoglobin 10.8 (*)    HCT 34.1 (*)    MCV 103.3 (*)    All other components within normal limits   URINALYSIS, ROUTINE W REFLEX MICROSCOPIC - Abnormal; Notable for the following components:   APPearance CLOUDY (*)    Hgb urine dipstick MODERATE (*)    Ketones, ur 5 (*)    Protein, ur 100 (*)    Leukocytes,Ua LARGE (*)    RBC / HPF >50 (*)    WBC, UA >50 (*)    Bacteria, UA RARE (*)    All other components within normal limits  PROTIME-INR - Abnormal; Notable for the following components:   Prothrombin Time 32.4 (*)    INR 3.2 (*)    All other components within  normal limits  TROPONIN I (HIGH SENSITIVITY) - Abnormal; Notable for the following components:   Troponin I (High Sensitivity) 23 (*)    All other components within normal limits  TROPONIN I (HIGH SENSITIVITY) - Abnormal; Notable for the following components:   Troponin I (High Sensitivity) 23 (*)    All other components within normal limits  URINE CULTURE  RESP PANEL BY RT-PCR (FLU A&B, COVID) [id number]  LIPASE, BLOOD    EKG EKG Interpretation  Date/Time:  Friday February 05 2022 08:25:47 EST Ventricular Rate:  69 PR Interval:    QRS Duration: 152 QT Interval:  473 QTC Calculation: 507 R Axis:   174 Text Interpretation: Atrial fibrillation Consider left ventricular hypertrophy Anterior Q waves, possibly due to LVH Prolonged QT interval Confirmed by Sherwood Gambler [phone]) on 02/05/2022 8:29:55 AM  Radiology CT ABDOMEN PELVIS WO CONTRAST  Result Date: 02/05/2022 CLINICAL DATA:  LEFT lower quadrant abdominal pain EXAM: CT ABDOMEN AND PELVIS WITHOUT CONTRAST TECHNIQUE: Multidetector CT imaging of the abdomen and pelvis was performed following the standard protocol without IV contrast. IV contrast was not utilized since patient is allergic to contrast material. Sagittal and coronal MPR images reconstructed from axial data set. RADIATION DOSE REDUCTION: This exam was performed according to the departmental dose-optimization program which includes automated exposure control, adjustment of the mA and/or kV according to  patient size and/or use of iterative reconstruction technique. COMPARISON:  11/13/2019 FINDINGS: Lower chest: LEFT lower lobe nodule 5 mm image 17 unchanged. Progressive bibasilar subpleural interstitial thickening. Trace LEFT pleural effusion. Hepatobiliary: Small hepatic cysts. Radiodense liver. Gallbladder unremarkable. Pancreas: Atrophic pancreas. Cystic lesion at uncinate process 13 x 19 x 16 mm, unchanged. Spleen: Normal appearance Adrenals/Urinary Tract: Adrenal glands normal appearance. Unremarkable RIGHT kidney. Multiple LEFT renal calculi up to 18 mm and 19 mm in greatest sizes. 2.5 cm peripelvic cyst. Mild dilatation of LEFT renal pelvis with peripelvic stranding. No ureteral calcification or dilatation. Bladder unremarkable. Stomach/Bowel: Diverticulosis of descending and sigmoid colon without evidence of diverticulitis. Large hiatal hernia with questionable wall thickening of the stomach versus artifact from underdistention. Stool throughout colon. Remaining bowel loops unremarkable. Tiny appendiceal stump noted post appendectomy. Vascular/Lymphatic: Atherosclerotic calcifications aorta, iliac arteries, coronary arteries. Dense mitral annular calcification. Post AVR. Atherosclerotic calcification versus graft at proximal ascending thoracic aorta. Enlargement of cardiac chambers. No adenopathy. Reproductive: Uterus surgically absent. Nonvisualization of ovaries. Other: No free air or free fluid.  No hernia. Musculoskeletal: Significant enlargement and hyperdensity of LEFT rectus abdominus 9.2 x 3.9 x 8.6 cm consistent with rectus sheath hematoma. This minimally osseous demineralization. Degenerative disc and facet disease changes of lumbar spine with levoconvex lumbar scoliosis. Extends into the LEFT flank muscular planes IMPRESSION: LEFT rectus sheath hematoma 9.2 x 3.9 x 8.6 cm. Distal colonic diverticulosis without evidence of diverticulitis. Large hiatal hernia with questionable wall thickening of  the stomach versus artifact from underdistention. Multiple LEFT renal calculi up to 18 mm in greatest size. Slightly increased dilatation of LEFT renal pelvis with peripelvic stranding, without UPJ calculus or ureteral dilatation; this likely reflects chronic infection, recommend correlation with urinalysis. Stable cystic lesion at uncinate process of the pancreas 13 x 19 x 16 mm; based on stability and patient age, no follow-up imaging recommended. Stable 5 mm LEFT lower lobe pulmonary nodule, unchanged since 2017. Progressive bibasilar subpleural interstitial thickening question interstitial lung disease. Aortic Atherosclerosis (ICD10-I70.0). Electronically Signed   By: Lavonia Dana M.D.   On: 02/05/2022 09:15   DG Chest 2 View  Result Date: 02/05/2022 CLINICAL DATA:  Pain to the left chest EXAM: CHEST - 2 VIEW COMPARISON:  06/17/2020 FINDINGS: Low volume chest. Retrocardiac density related to a hiatal hernia based on lateral image. Cardiomegaly. Prior AV valve replacement. There is no edema, consolidation, effusion, or pneumothorax. Generalized osteopenic appearance. IMPRESSION: 1. Cardiomegaly without edema. 2. Hiatal hernia. Electronically Signed   By: Jorje Guild M.D.   On: 02/05/2022 06:49    Procedures Procedures    Medications Ordered in ED Medications  ondansetron (ZOFRAN) injection 4 mg (has no administration in time range)  HYDROmorphone (DILAUDID) injection 0.5 mg (0.5 mg Intravenous Given 02/05/22 0830)  sodium chloride 0.9 % bolus 500 mL (0 mLs Intravenous Stopped 02/05/22 0921)  cefTRIAXone (ROCEPHIN) 1 g in sodium chloride 0.9 % 100 mL IVPB (1 g Intravenous New Bag/Given 02/05/22 1025)    ED Course/ Medical Decision Making/ A&P                           Medical Decision Making Amount and/or Complexity of Data Reviewed Labs: ordered. Radiology: ordered.  Risk Prescription drug management. Decision regarding hospitalization.   Patient has a rectus sheath hematoma on the  left side that correlates with the area of pain.  Unfortunately because of her mechanical heart valve, she cannot get reversal of her anticoagulation.  Given that seems to have worsened today and her hemoglobin is a point less than her baseline, I think she should be observed for serial hemoglobins and monitoring.  I did briefly discussed with Obie Dredge of general surgery, nothing for surgery to do at this point.  Just supportive care.  She is otherwise hemodynamically stable besides some hypertension.  She is feeling better pain control with IV Dilaudid.  I personally reviewed the CT images and see the significant hematoma.  A little limited due to no contrast due to her chronic kidney disease.  She also appears to have a urinary tract infection on urinalysis, and she does report some increased urination recently so I think she should be treated with antibiotics.  We will send culture.  ECG reviewed/interpreted and is benign compared to baseline.  Otherwise, I have consulted the hospitalist, Dr. Lorin Mercy for admission.        Final Clinical Impression(s) / ED Diagnoses Final diagnoses:  Hematoma of rectus sheath, initial encounter  Acute urinary tract infection    Rx / DC Orders ED Discharge Orders     None         Sherwood Gambler, MD 02/05/22 1031

## 2022-02-05 NOTE — H&P (Signed)
History and Physical    Patient: [name] HUD:[id number] DOB: 23-Nov-1941 DOA: 02/05/2022 DOS: the patient was seen and examined on 02/05/2022 PCP: Donnajean Lopes, MD  Patient coming from: Home - lives with husband; NOK: Husband, [phone], [phone]   Chief Complaint: Chest/abdominal pain  HPI: [name] is a 81 y.o. female with medical history significant of remote breast cancer;  HTN; HLD; afib on Coumadin; Parkinson's; and AVR presenting with chest and abdominal pain.  She reports left sided chest and abdominal pain x 2-3 days, worsening.  She is on Coumadin for a mechanical valve.  She a Parkinson's related condition and is very unstable; they are not sure if she had an injury to the area or not.  She also struggles to get into/out of bed.  She does not have any urinary symptoms other than chronic frequency.    ER Course:  Spontaneous rectus sheath hematoma, on Coumadin with mechanical valve.  Hgb only down 1 point.  Needs monitoring, pain control.  Surgery called, nothing to do at this time.  Abnormal UA, given Rocephin.     Review of Systems: As mentioned in the history of present illness. All other systems reviewed and are negative. Past Medical History:  Diagnosis Date   BPV (benign positional vertigo)    Chronic neck pain    C2-3 arthropathy   CKD (chronic kidney disease), stage II    Diverticulosis of colon    GERD (gastroesophageal reflux disease)    Hiatal hernia    History of breast cancer per pt no recurrence   dx 2004-- Left breast DCIS (ER/PR+, HER2 negative) s/p  partial masectomy w/ sln bx and  chemoradiotion (completed 2004)   History of herpes zoster    History of kidney stones    HTN (hypertension)    Hyperlipidemia    Irritable bowel syndrome    LBBB (left bundle branch block)    PAF (paroxysmal atrial fibrillation) (HCC)    a. CHA2DS2VASc = 4-->coumadin.   Parkinsonism [facility])    neurologist-  dr tat   PONV  (postoperative nausea and vomiting)    S/P aortic valve replacement with prosthetic valve    a. 04/13/2006 s/p St Jude mechnical AVR for severe AS;  b. 09/2014 Echo: EF 40-45%, gr1 DD, mild AI/MR, triv TR/PR.   SUI (stress urinary incontinence, female)    Symptomatic anemia    Venous varices     Past Surgical History:  Procedure Laterality Date   ANTERIOR AND POSTERIOR REPAIR  1990   cystocele/ rectocele   AORTIC VALVE REPLACEMENT  04/13/06   dr gerhardt   and Replacement Ascending Aorta (St. Jude mechanical prosthesis)   APPENDECTOMY  child 1950   BREAST EXCISIONAL BIOPSY Left    BREAST LUMPECTOMY Left 01/11/2003   malignant   BREAST SURGERY Left 2004   CARDIAC CATHETERIZATION  02-22-2001   dr Martinique   minimal non-obstructive cad/  mild aortic stenosis and aortic root enlargement/  normal LVF, ef 65%   CARDIAC CATHETERIZATION  03-22-2006    dr Martinique   no significant obstructive cad/  severe aortic stenosis and mild to moderate aortic root enlargement/  upper normal right heart pressures   CARDIOVASCULAR STRESS TEST  09-30-2011   dr Martinique   lexiscan nuclear study w/ apical thinning  vs  small prior apical infarct w/ no significant ischemia/  hypokinesis of the distal septum and apex, ef 50%   CARPAL TUNNEL RELEASE Bilateral left 09-09-2004/  right 2007  CATARACT EXTRACTION W/ INTRAOCULAR LENS  IMPLANT, BILATERAL  2015   COLONOSCOPY N/A 10/03/2014   Procedure: COLONOSCOPY;  Surgeon: [name], MD;  Location: [place];  Service: Endoscopy;  Laterality: N/A;   CYSTOSCOPY WITH RETROGRADE PYELOGRAM, URETEROSCOPY AND STENT PLACEMENT Right 09/17/2016   Procedure: CYSTOSCOPY WITH RIGHT RETROGRADE PYELOGRAM, RIGHT URETEROSCOPY AND STENT PLACEMENT LASER LITHOTRIPSY, RIGHT STENT PLACEMENT;  Surgeon: [name], MD;  Location: [facility];  Service: Urology;  Laterality: Right;   ESOPHAGEAL MANOMETRY N/A 08/12/2014   Procedure: ESOPHAGEAL MANOMETRY (EM);  Surgeon:  [name], MD;  Location: [facility];  Service: Endoscopy;  Laterality: N/A;   ESOPHAGOGASTRODUODENOSCOPY N/A 10/03/2014   Procedure: ESOPHAGOGASTRODUODENOSCOPY (EGD);  Surgeon: [name], MD;  Location: [facility];  Service: Endoscopy;  Laterality: N/A;   ESOPHAGOGASTRODUODENOSCOPY N/A 12/08/2018   Procedure: ESOPHAGOGASTRODUODENOSCOPY (EGD);  Surgeon: Doran Stabler, MD;  Location: [place];  Service: Gastroenterology;  Laterality: N/A;   EXTRACORPOREAL SHOCK WAVE LITHOTRIPSY  1997   FOOT SURGERY Right 2013   hammertoe x2   HOLMIUM LASER APPLICATION Right 7/82/9562   Procedure: HOLMIUM LASER APPLICATION;  Surgeon: [name], MD;  Location: [facility];  Service: Urology;  Laterality: Right;   PERCUTANEOUS NEPHROSTOLITHOTOMY  1993   STONE EXTRACTION WITH BASKET Right 09/17/2016   Procedure: STONE EXTRACTION WITH BASKET;  Surgeon: [name], MD;  Location: [facility];  Service: Urology;  Laterality: Right;   TOTAL ABDOMINAL HYSTERECTOMY  1971   w/ Bilateral Salpingoophorectomy   TRANSTHORACIC ECHOCARDIOGRAM  10/24/2014   dr Martinique   hypokinesis of the basal and mid inferoseptal and inferior walls,  ef 40-45%,  grade 1 diastolic dysfunction/  mechanical bileaflet aortic valve noted w/ mild regur., peak grandient 83mHg, valve area 1.35cm^2/  severe MV calcification without stenosis w/ mild regurg., peak gradient 358mg/  mild LAE/  poss. atrial mass (MRI showed benign lipomatous hypertrophy of atrial septum) /tFrazier ButtR/mild TR   Social History:  reports that she has never smoked. She has never used smokeless tobacco. She reports that she does not drink alcohol and does not use drugs.  Allergies  Allergen Reactions   Demerol [Meperidine] Shortness Of Breath and Swelling   Oxycodone Other (See Comments)    Hallucinations    Adhesive [Tape] Other (See Comments)    Redness and skin tears   Ivp Dye [Iodinated Contrast Media]  Hives    OK with benadryl pre-med (5085mne hour before receiving iodinated contrast agent)   Phenergan [Promethazine] Other (See Comments)    Avoids due to reaction with current medications    Family History  Problem Relation Age of Onset   Heart disease Mother    Heart disease Father 76 74    CABG   Other Brother        AVR   Healthy Child     Prior to Admission medications   Medication Sig Start Date End Date Taking? Authorizing Provider  carbidopa-levodopa (SINEMET IR) 25-100 MG tablet TAKE 2 TABLETS BY MOUTH THREE TIMES DAILY 01/22/22   Tat, RebEustace QuailO  acetaminophen (TYLENOL) 500 MG tablet Take 1,000 mg by mouth every 6 (six) hours as needed (head pain).     [provider]  amiodarone (PACERONE) 200 MG tablet Take 1 tablet by mouth once daily 05/12/21   SeiPatsey BertholdP  amoxicillin (AMOXIL) 500 MG capsule Before dentist 03/03/21   [provider]  calcium carbonate (  TUMS - DOSED IN MG ELEMENTAL CALCIUM) 500 MG chewable tablet Chew 1 tablet by mouth daily as needed for indigestion or heartburn.     [provider]  calcium citrate-vitamin D (CITRACAL+D) 315-200 MG-UNIT per tablet Take 1 tablet by mouth 3 (three) times daily.    [provider]  Cholecalciferol (VITAMIN D3) 2000 units TABS Take 2,000 Units by mouth daily.     [provider]  COVID-19 mRNA bivalent vaccine, Pfizer, (PFIZER COVID-19 VAC BIVALENT) injection Inject into the muscle. 10/30/21   Carlyle Basques, MD  COVID-19 mRNA Vac-TriS, Pfizer, (PFIZER-BIONT COVID-19 VAC-TRIS) SUSP injection Inject into the muscle. 08/04/21   Carlyle Basques, MD  famotidine-calcium carbonate-magnesium hydroxide (PEPCID COMPLETE) 10-800-165 MG CHEW chewable tablet Chew 1 tablet by mouth daily as needed (heartburn).    [provider]  fexofenadine (ALLEGRA) 60 MG tablet Take 60 mg by mouth daily.    [provider]  furosemide (LASIX) 20 MG tablet Take 1 tablet by mouth once  daily 05/12/21   Evans Lance, MD  KLOR-CON M20 20 MEQ tablet Take 20 mEq by mouth daily. 05/26/17   [provider]  Melatonin 3 MG TABS Take 3 mg by mouth at bedtime as needed (sleep).     [provider]  metoprolol tartrate (LOPRESSOR) 25 MG tablet Take 1 tablet (25 mg total) by mouth daily as needed (if heart rate is above 110). 11/13/20 11/08/21  Martinique, Peter M, MD  Multiple Vitamin (MULTIVITAMIN) tablet Take 1 tablet by mouth daily at 12 noon.    [provider]  pantoprazole (PROTONIX) 40 MG tablet Take 40 mg by mouth daily. 06/07/17   [provider]  Probiotic Product (PROBIOTIC-10 PO) Take 1 capsule by mouth daily.     [provider]  simvastatin (ZOCOR) 20 MG tablet Take 20 mg by mouth every evening.    [provider]  vitamin B-12 (CYANOCOBALAMIN) 1000 MCG tablet Take 1,000 mcg by mouth every morning.    [provider]  warfarin (COUMADIN) 5 MG tablet Take 5 mg by mouth daily. Patient is taking 2.95m on Sunday, Tues, [place]. Patient is taking 5 mg on Monday, Wed, Friday.    [provider]    Physical Exam: Vitals:   02/05/22 1100 02/05/22 1450 02/05/22 1600 02/05/22 1700  BP: (!) 137/107 123/81 138/73 (!) 158/85  Pulse: 81 82 86 72  Resp: 18 17 20 20   Temp:   98.7 F (37.1 C)   TempSrc:   Oral   SpO2: 97% 97% 100% 95%  Weight:      Height:       General:  Appears calm and comfortable and is in NAD, +resting tremor, flat facies Eyes:   EOMI, normal lids, iris ENT:  grossly normal hearing, lips & tongue, mmm Neck:  no LAD, masses or thyromegaly Cardiovascular:  RRR, no m/r/g. No LE edema.  Respiratory:   CTA bilaterally with no wheezes/rales/rhonchi.  Normal respiratory effort. Abdomen:  large left-sided superficial mass c/w hematoma as per imaging Skin:  no rash or induration seen on limited exam Musculoskeletal:  increased tone BUE/BLE,  no bony abnormality Psychiatric:  flat mood and  affect, speech sparse but appropriate   Radiological Exams on Admission: Independently reviewed - see discussion in A/P where applicable  CT ABDOMEN PELVIS WO CONTRAST  Result Date: 02/05/2022 CLINICAL DATA:  LEFT lower quadrant abdominal pain EXAM: CT ABDOMEN AND PELVIS WITHOUT CONTRAST TECHNIQUE: Multidetector CT imaging of the abdomen and  pelvis was performed following the standard protocol without IV contrast. IV contrast was not utilized since patient is allergic to contrast material. Sagittal and coronal MPR images reconstructed from axial data set. RADIATION DOSE REDUCTION: This exam was performed according to the departmental dose-optimization program which includes automated exposure control, adjustment of the mA and/or kV according to patient size and/or use of iterative reconstruction technique. COMPARISON:  11/13/2019 FINDINGS: Lower chest: LEFT lower lobe nodule 5 mm image 17 unchanged. Progressive bibasilar subpleural interstitial thickening. Trace LEFT pleural effusion. Hepatobiliary: Small hepatic cysts. Radiodense liver. Gallbladder unremarkable. Pancreas: Atrophic pancreas. Cystic lesion at uncinate process 13 x 19 x 16 mm, unchanged. Spleen: Normal appearance Adrenals/Urinary Tract: Adrenal glands normal appearance. Unremarkable RIGHT kidney. Multiple LEFT renal calculi up to 18 mm and 19 mm in greatest sizes. 2.5 cm peripelvic cyst. Mild dilatation of LEFT renal pelvis with peripelvic stranding. No ureteral calcification or dilatation. Bladder unremarkable. Stomach/Bowel: Diverticulosis of descending and sigmoid colon without evidence of diverticulitis. Large hiatal hernia with questionable wall thickening of the stomach versus artifact from underdistention. Stool throughout colon. Remaining bowel loops unremarkable. Tiny appendiceal stump noted post appendectomy. Vascular/Lymphatic: Atherosclerotic calcifications aorta, iliac arteries, coronary arteries. Dense mitral annular  calcification. Post AVR. Atherosclerotic calcification versus graft at proximal ascending thoracic aorta. Enlargement of cardiac chambers. No adenopathy. Reproductive: Uterus surgically absent. Nonvisualization of ovaries. Other: No free air or free fluid.  No hernia. Musculoskeletal: Significant enlargement and hyperdensity of LEFT rectus abdominus 9.2 x 3.9 x 8.6 cm consistent with rectus sheath hematoma. This minimally osseous demineralization. Degenerative disc and facet disease changes of lumbar spine with levoconvex lumbar scoliosis. Extends into the LEFT flank muscular planes IMPRESSION: LEFT rectus sheath hematoma 9.2 x 3.9 x 8.6 cm. Distal colonic diverticulosis without evidence of diverticulitis. Large hiatal hernia with questionable wall thickening of the stomach versus artifact from underdistention. Multiple LEFT renal calculi up to 18 mm in greatest size. Slightly increased dilatation of LEFT renal pelvis with peripelvic stranding, without UPJ calculus or ureteral dilatation; this likely reflects chronic infection, recommend correlation with urinalysis. Stable cystic lesion at uncinate process of the pancreas 13 x 19 x 16 mm; based on stability and patient age, no follow-up imaging recommended. Stable 5 mm LEFT lower lobe pulmonary nodule, unchanged since 2017. Progressive bibasilar subpleural interstitial thickening question interstitial lung disease. Aortic Atherosclerosis (ICD10-I70.0). Electronically Signed   By: Lavonia Dana M.D.   On: 02/05/2022 09:15   DG Chest 2 View  Result Date: 02/05/2022 CLINICAL DATA:  Pain to the left chest EXAM: CHEST - 2 VIEW COMPARISON:  06/17/2020 FINDINGS: Low volume chest. Retrocardiac density related to a hiatal hernia based on lateral image. Cardiomegaly. Prior AV valve replacement. There is no edema, consolidation, effusion, or pneumothorax. Generalized osteopenic appearance. IMPRESSION: 1. Cardiomegaly without edema. 2. Hiatal hernia. Electronically Signed    By: Jorje Guild M.D.   On: 02/05/2022 06:49    EKG: Independently reviewed.  Afib with rate 69; prolonged QTc 507; IVCD with no evidence of acute ischemia   Labs on Admission: I have personally reviewed the available labs and imaging studies at the time of the admission.  Pertinent labs:    BUN 28/Creatinine 1.94/GFR 26 - stable HS troponin 23, 23 Hgb 10.8; 11.5 in 08/2021 INR 3.2 UA: moderate Hgb, 5 ketones, large LE, 100 protein, rare bacteria, >50 WBC    Assessment and Plan: * Rectus sheath hematoma, initial encounter- (present on admission) -Patient with superficial abdominal muscle hematoma -This may have  been traumatic, from mild overexertion, or spontaneous in the setting of AC -Regardless, she must remain on AC -Hgb is slightly lower than prior, will trend -Will observe to ensure symptoms are not worsening -PT/OT consults requested -Surgery was consulted but did not see an indication for intervention -If symptoms are worsening, repeat imaging may be warranted -If improving, she is likely ok for dc to home -Reabsorption of the hematoma is likely to take quite some time - particularly in light of ongoing need for [facility]  Paroxysmal atrial fibrillation ([place])- (present on admission) -Continue amiodarone (cardiology is trying to wean the dose) -Continue Coumadin but pharmacy to dose at hopefully slightly lower dose  PSP (progressive supranuclear palsy) ([place])- (present on admission) -Essentially with Parkinsonism -Continue Sinemet  CKD (chronic kidney disease), stage III ([place])- (present on admission) -Stage 3b/IV CKD -Appears to be stable at this time -Will trend  UTI (urinary tract infection)- (present on admission) -Possible UTI vs. Asymptomatic bacteriuria - patient and husband report no significant change from baseline -UA mildly abnormal but may be c/w dehydration -She was given 1 dose of Rocephin -Will not continue Rocephin at this time -Urine culture is  pending  Hypercholesteremia- (present on admission) -Continue Zocor  HTN (hypertension)- (present on admission) -She is not on chronic meds for this and just takes prn lopressor for HR -Will order prn IV hydralazine  S/P AVR (aortic valve replacement) -She needs ongoing AC due to her valve replacement -Current INR is 3.2 with goal range 2.5-3.5 -Would shoot for closer to 2.5 to allow for healing of the hematoma -Pharmacy to dose and is aware of goal       Advance Care Planning:   Code Status: Full Code   Consults: Surgery; TOC team; PT/OT  DVT Prophylaxis: Coumadin  Family Communication: Husband was present throughout evaluation  Severity of Illness: The appropriate patient status for this patient is OBSERVATION. Observation status is judged to be reasonable and necessary in order to provide the required intensity of service to ensure the patient's safety. The patient's presenting symptoms, physical exam findings, and initial radiographic and laboratory data in the context of their medical condition is felt to place them at decreased risk for further clinical deterioration. Furthermore, it is anticipated that the patient will be medically stable for discharge from the hospital within 2 midnights of admission.   Author: Karmen Bongo, MD 02/05/2022 6:11 PM  For on call review [email or web address].

## 2022-02-05 NOTE — Assessment & Plan Note (Signed)
-  Continue amiodarone (cardiology is trying to wean the dose) -Continue Coumadin but pharmacy to dose at hopefully slightly lower dose

## 2022-02-05 NOTE — Progress Notes (Signed)
ANTICOAGULATION CONSULT NOTE - Initial Consult  Pharmacy Consult for warfarin Indication: MAVR, afib  Allergies  Allergen Reactions   Demerol [Meperidine] Shortness Of Breath and Swelling   Oxycodone Other (See Comments)    Hallucinations    Adhesive [Tape] Other (See Comments)    Redness and skin tears   Ivp Dye [Iodinated Contrast Media] Hives    OK with benadryl pre-med (52m one hour before receiving iodinated contrast agent)   Phenergan [Promethazine] Other (See Comments)    Avoids due to reaction with current medications    Patient Measurements: Height: _0  (149.9 cm) Weight: 54 kg (119 lb) IBW/kg (Calculated) : 43.2   Vital Signs: Temp: 98.1 F (36.7 C) (02/10 0543) Temp Source: Oral (02/10 0543) BP: 147/125 (02/10 1030) Pulse Rate: 90 (02/10 1030)  Labs: Recent Labs    02/05/22 0629 02/05/22 0823  HGB 10.8*  --   HCT 34.1*  --   PLT 211  --   LABPROT 32.4*  --   INR 3.2*  --   CREATININE 1.94*  --   TROPONINIHS 23* 23*    Estimated Creatinine Clearance: 17.1 mL/min (A) (by C-G formula based on SCr of 1.94 mg/dL (H)).   Medical History: Past Medical History:  Diagnosis Date   BPV (benign positional vertigo)    Chronic neck pain    C2-3 arthropathy   CKD (chronic kidney disease), stage II    Diverticulosis of colon    GERD (gastroesophageal reflux disease)    Hiatal hernia    History of breast cancer per pt no recurrence   dx 2004-- Left breast DCIS (ER/PR+, HER2 negative) s/p  partial masectomy w/ sln bx and  chemoradiotion (completed 2004)   History of herpes zoster    History of kidney stones    HTN (hypertension)    Hyperlipidemia    Irritable bowel syndrome    LBBB (left bundle branch block)    PAF (paroxysmal atrial fibrillation) (HCC)    a. CHA2DS2VASc = 4-->coumadin.   Parkinsonism ([facility]    neurologist-  dr tat   PONV (postoperative nausea and vomiting)    S/P aortic valve replacement with prosthetic valve    a. 04/13/2006 s/p St  Jude mechnical AVR for severe AS;  b. 09/2014 Echo: EF 40-45%, gr1 DD, mild AI/MR, triv TR/PR.   SUI (stress urinary incontinence, female)    Symptomatic anemia    Venous varices      Assessment: 89YOF presenting with chest pain, has rectus sheath hematoma, she is on warfarin PTA for mechanical AVR, INR on admission 3.2, last dose 2/9.   PTA dosing: 573mMWF, 2.40m47mvery other day  Goal of Therapy:  INR 2.5-3.5  usually, per MD target 2-3 for now with current hematoma Monitor platelets by anticoagulation protocol: Yes   Plan:  Warfarin 2.40mg64m x 1 today (1/2 PTA dosing) Daily INR, s/s bleeding  Christy RuddyarmD Clinical Pharmacist ED Pharmacist Phone # [phone]/2023 11:45 AM

## 2022-02-06 DIAGNOSIS — S301XXA Contusion of abdominal wall, initial encounter: Secondary | ICD-10-CM | POA: Diagnosis not present

## 2022-02-06 LAB — BASIC METABOLIC PANEL
Anion gap: 10 (ref 5–15)
BUN: 33 mg/dL — ABNORMAL HIGH (ref 8–23)
CO2: 27 mmol/L (ref 22–32)
Calcium: 8.8 mg/dL — ABNORMAL LOW (ref 8.9–10.3)
Chloride: 100 mmol/L (ref 98–111)
Creatinine, Ser: 2.44 mg/dL — ABNORMAL HIGH (ref 0.44–1.00)
GFR, Estimated: 19 mL/min — ABNORMAL LOW (ref >60)
Glucose, Bld: 138 mg/dL — ABNORMAL HIGH (ref 70–99)
Potassium: 5.1 mmol/L (ref 3.5–5.1)
Sodium: 137 mmol/L (ref 135–145)

## 2022-02-06 LAB — CBC
HCT: 23 % — ABNORMAL LOW (ref 36.0–46.0)
Hemoglobin: 7.5 g/dL — ABNORMAL LOW (ref 12.0–15.0)
MCH: 33.3 pg (ref 26.0–34.0)
MCHC: 32.6 g/dL (ref 30.0–36.0)
MCV: 102.2 fL — ABNORMAL HIGH (ref 80.0–100.0)
Platelets: 183 10*3/uL (ref 150–400)
RBC: 2.25 MIL/uL — ABNORMAL LOW (ref 3.87–5.11)
RDW: 14.5 % (ref 11.5–15.5)
WBC: 7.9 10*3/uL (ref 4.0–10.5)
nRBC: 0 % (ref 0.0–0.2)

## 2022-02-06 LAB — PROTIME-INR
INR: 3.3 — ABNORMAL HIGH (ref 0.8–1.2)
Prothrombin Time: 33.7 seconds — ABNORMAL HIGH (ref 11.4–15.2)

## 2022-02-06 LAB — HEMOGLOBIN AND HEMATOCRIT, BLOOD
HCT: 22.2 % — ABNORMAL LOW (ref 36.0–46.0)
Hemoglobin: 7.1 g/dL — ABNORMAL LOW (ref 12.0–15.0)

## 2022-02-06 MED ORDER — OXYCODONE HCL 5 MG PO TABS: 5.0000 mg | ORAL_TABLET | ORAL | Status: DC | PRN

## 2022-02-06 NOTE — Progress Notes (Signed)
ANTICOAGULATION CONSULT NOTE - follow-up  Pharmacy Consult for warfarin Indication: MAVR, afib  Allergies  Allergen Reactions   Demerol [Meperidine] Shortness Of Breath and Swelling   Oxycodone Other (See Comments)    Hallucinations    Adhesive [Tape] Other (See Comments)    Redness and skin tears   Ivp Dye [Iodinated Contrast Media] Hives    OK with benadryl pre-med (11m one hour before receiving iodinated contrast agent)   Phenergan [Promethazine] Other (See Comments)    Avoids due to reaction with current medications    Patient Measurements: Height: 4' 11"  (149.9 cm) Weight: 57.2 kg (126 lb 1.7 oz) IBW/kg (Calculated) : 43.2   Vital Signs: Temp: 98.6 F (37 C) (02/11 0427) Temp Source: Oral (02/11 0427) BP: 82/61 (02/11 0427) Pulse Rate: 90 (02/11 0427)  Labs: Recent Labs    02/05/22 0629 02/05/22 0823 02/06/22 0113  HGB 10.8*  --  7.5*  HCT 34.1*  --  23.0*  PLT 211  --  183  LABPROT 32.4*  --  33.7*  INR 3.2*  --  3.3*  CREATININE 1.94*  --  2.44*  TROPONINIHS 23* 23*  --      Estimated Creatinine Clearance: 13.9 mL/min (A) (by C-G formula based on SCr of 2.44 mg/dL (H)).   Medical History: Past Medical History:  Diagnosis Date   BPV (benign positional vertigo)    Chronic neck pain    C2-3 arthropathy   CKD (chronic kidney disease), stage II    Diverticulosis of colon    GERD (gastroesophageal reflux disease)    Hiatal hernia    History of breast cancer per pt no recurrence   dx 2004-- Left breast DCIS (ER/PR+, HER2 negative) s/p  partial masectomy w/ sln bx and  chemoradiotion (completed 2004)   History of herpes zoster    History of kidney stones    HTN (hypertension)    Hyperlipidemia    Irritable bowel syndrome    LBBB (left bundle branch block)    PAF (paroxysmal atrial fibrillation) (HCC)    a. CHA2DS2VASc = 4-->coumadin.   Parkinsonism ([facility]    neurologist-  dr tat   PONV (postoperative nausea and vomiting)    S/P aortic valve  replacement with prosthetic valve    a. 04/13/2006 s/p St Jude mechnical AVR for severe AS;  b. 09/2014 Echo: EF 40-45%, gr1 DD, mild AI/MR, triv TR/PR.   SUI (stress urinary incontinence, female)    Symptomatic anemia    Venous varices      Assessment: 89YOF presenting with chest pain, has rectus sheath hematoma, she is on warfarin PTA for mechanical AVR, INR on admission 3.2, last dose 2/9.   PTA dosing: 529mMWF, 2.50m30mvery other day  2/10- INR of 3.2- warfarin 2.5 mg po given   2/11- INR 3.3  Goal of Therapy:  INR 2.5-3.5  usually, per MD target 2-3 for now with current hematoma Monitor platelets by anticoagulation protocol: Yes   Plan:  Warfarin 0 mg PO x 1 today Daily INR, s/s bleeding  Alvan Culpepper BS, PharmD, BCPS Clinical Pharmacist 02/06/2022 8:10 AM

## 2022-02-06 NOTE — Progress Notes (Addendum)
PROGRESS NOTE    [name]  QHU:[id number] DOB: 01/26/1941 DOA: 02/05/2022 PCP: Donnajean Lopes, MD   Brief Narrative:  [name] is a 81 y.o. female with medical history significant of remote breast cancer;  HTN; HLD; afib/mechanical valve on Coumadin; Parkinson's; and AVR presenting with pelvic/abdominal pain. Found to have moderate size left rectus sheath hematoma.  Worsening anemia and admitted for close observation and possible transfusion.   Assessment & Plan:   Principal Problem:   Rectus sheath hematoma, initial encounter Active Problems:   S/P AVR (aortic valve replacement)   HTN (hypertension)   Hypercholesteremia   Chronic anticoagulation   Symptomatic anemia   CKD (chronic kidney disease), stage III (HCC)   Parkinsonism (HCC)   PSP (progressive supranuclear palsy) (HCC)   Paroxysmal atrial fibrillation (HCC)  Rectus sheath hematoma, initial encounter- (present on admission) -Unclear etiology denies trauma, possibly spontaneous in the setting of anticoagulation -Hemoglobin continues to downtrend, follow repeat labs, discussed transfusion with patient and family who are agreeable if necessary -Continue anticoagulation with warfarin due to mechanical valve -Pharmacy following INR -Surgery was consulted -no indication for surgery   Paroxysmal atrial fibrillation ([place])- (present on admission) -Continue amiodarone (cardiology is trying to wean the dose) -Continue Coumadin -pharmacy following INR   PSP (progressive supranuclear palsy) ([place])- (present on admission) -With parkinsonism features -Continue Sinemet   AKI on CKD (chronic kidney disease), stage IIIb -Worsening creatinine over the past 24h likely secondary to anemia -Continue to advance diet increase fluid intake, possible transfusion as above   UTI (urinary tract infection)- (present on admission) -Ruled out, no signs or symptoms of UTI at this point patient denies hesitancy  urgency or dysuria   Hypercholesteremia- (present on admission) -Continue Zocor   HTN (hypertension)- (present on admission) -She is not on chronic meds for this and just takes prn lopressor for HR -Will order prn IV hydralazine   S/P AVR (aortic valve replacement) -She needs ongoing AC due to her valve replacement  DVT prophylaxis: Warfarin Code Status: Full Family Communication: At bedside  Status is: Inpatient  Dispo: The patient is from: Home              Anticipated d/c is to: Home              Anticipated d/c date is: 48 to 72 hours              Patient currently not medically stable for discharge  Consultants:  None  Procedures:  None  Antimicrobials:  None  Subjective: No acute issues or events overnight abdomen/groin pain improving, denies nausea vomiting diarrhea constipation headache fevers chills or chest pain  Objective: Vitals:   02/05/22 2033 02/05/22 2339 02/06/22 0003 02/06/22 0427  BP: (!) 100/55 (!) 97/52  (!) 82/61  Pulse: 84 87  90  Resp: 17 20    Temp: 98.3 F (36.8 C) 98.9 F (37.2 C)  98.6 F (37 C)  TempSrc: Oral Oral  Oral  SpO2: 98% 91% 96% 99%  Weight:      Height:        Intake/Output Summary (Last 24 hours) at 02/06/2022 1225 Last data filed at 02/06/2022 0300 Gross per 24 hour  Intake 480 ml  Output --  Net 480 ml   Filed Weights   02/05/22 0617 02/05/22 1942  Weight: 54 kg 57.2 kg    Examination:  General:  Pleasantly resting in bed, No acute distress. HEENT:  Normocephalic atraumatic.  Sclerae nonicteric,  noninjected.  Extraocular movements intact bilaterally. Neck:  Without mass or deformity.  Trachea is midline. Lungs:  Clear to auscultate bilaterally without rhonchi, wheeze, or rales. Heart:  Regular rate and rhythm.  Without murmurs, rubs, or gallops. Abdomen:  Soft, nontender, nondistended.  Without guarding or rebound. Extremities: Without cyanosis, clubbing, edema, or obvious deformity. Vascular:  Dorsalis  pedis and posterior tibial pulses palpable bilaterally. Skin:  Warm and dry, no erythema, no ulcerations.  Data Reviewed: I have personally reviewed following labs and imaging studies  CBC: Recent Labs  Lab 02/05/22 0629 02/06/22 0113  WBC 6.5 7.9  HGB 10.8* 7.5*  HCT 34.1* 23.0*  MCV 103.3* 102.2*  PLT 211 884   Basic Metabolic Panel: Recent Labs  Lab 02/05/22 0629 02/06/22 0113  NA 136 137  K 4.7 5.1  CL 100 100  CO2 27 27  GLUCOSE 99 138*  BUN 28* 33*  CREATININE 1.94* 2.44*  CALCIUM 9.5 8.8*   GFR: Estimated Creatinine Clearance: 13.9 mL/min (A) (by C-G formula based on SCr of 2.44 mg/dL (H)). Liver Function Tests: Recent Labs  Lab 02/05/22 0629  AST 39  ALT <5  ALKPHOS 79  BILITOT 1.0  PROT 6.0*  ALBUMIN 3.3*   Recent Labs  Lab 02/05/22 0629  LIPASE 27   No results for input(s): AMMONIA in the last 168 hours. Coagulation Profile: Recent Labs  Lab 02/05/22 0629 02/06/22 0113  INR 3.2* 3.3*   Cardiac Enzymes: No results for input(s): CKTOTAL, CKMB, CKMBINDEX, TROPONINI in the last 168 hours. BNP (last 3 results) No results for input(s): PROBNP in the last 8760 hours. HbA1C: No results for input(s): HGBA1C in the last 72 hours. CBG: No results for input(s): GLUCAP in the last 168 hours. Lipid Profile: No results for input(s): CHOL, HDL, LDLCALC, TRIG, CHOLHDL, LDLDIRECT in the last 72 hours. Thyroid Function Tests: No results for input(s): TSH, T4TOTAL, FREET4, T3FREE, THYROIDAB in the last 72 hours. Anemia Panel: No results for input(s): VITAMINB12, FOLATE, FERRITIN, TIBC, IRON, RETICCTPCT in the last 72 hours. Sepsis Labs: No results for input(s): PROCALCITON, LATICACIDVEN in the last 168 hours.  Recent Results (from the past 240 hour(s))  Resp Panel by RT-PCR (Flu A&B, Covid) Nasopharyngeal Swab     Status: None   Collection Time: 02/05/22  9:45 AM   Specimen: Nasopharyngeal Swab; Nasopharyngeal(NP) swabs in vial transport medium   Result Value Ref Range Status   SARS Coronavirus 2 by RT PCR NEGATIVE NEGATIVE Final    Comment: (NOTE) SARS-CoV-2 target nucleic acids are NOT DETECTED.  The SARS-CoV-2 RNA is generally detectable in upper respiratory specimens during the acute phase of infection. The lowest concentration of SARS-CoV-2 viral copies this assay can detect is 138 copies/mL. A negative result does not preclude SARS-Cov-2 infection and should not be used as the sole basis for treatment or other patient management decisions. A negative result may occur with  improper specimen collection/handling, submission of specimen other than nasopharyngeal swab, presence of viral mutation(s) within the areas targeted by this assay, and inadequate number of viral copies(<138 copies/mL). A negative result must be combined with clinical observations, patient history, and epidemiological information. The expected result is Negative.  Fact Sheet for Patients:  EntrepreneurPulse.com.au  Fact Sheet for Healthcare Providers:  IncredibleEmployment.be  This test is no t yet approved or cleared by the [place] FDA and  has been authorized for detection and/or diagnosis of SARS-CoV-2 by FDA under an Emergency Use Authorization (EUA). This EUA will remain  in effect (meaning this test can be used) for the duration of the COVID-19 declaration under Section 564(b)(1) of the Act, 21 U.S.C.section 360bbb-3(b)(1), unless the authorization is terminated  or revoked sooner.       Influenza A by PCR NEGATIVE NEGATIVE Final   Influenza B by PCR NEGATIVE NEGATIVE Final    Comment: (NOTE) The Xpert Xpress SARS-CoV-2/FLU/RSV plus assay is intended as an aid in the diagnosis of influenza from Nasopharyngeal swab specimens and should not be used as a sole basis for treatment. Nasal washings and aspirates are unacceptable for Xpert Xpress SARS-CoV-2/FLU/RSV testing.  Fact Sheet for  Patients: EntrepreneurPulse.com.au  Fact Sheet for Healthcare Providers: IncredibleEmployment.be  This test is not yet approved or cleared by the [place] FDA and has been authorized for detection and/or diagnosis of SARS-CoV-2 by FDA under an Emergency Use Authorization (EUA). This EUA will remain in effect (meaning this test can be used) for the duration of the COVID-19 declaration under Section 564(b)(1) of the Act, 21 U.S.C. section 360bbb-3(b)(1), unless the authorization is terminated or revoked.  Performed at [facility], [place] [address], [place], [place] [address]          Radiology Studies: CT ABDOMEN PELVIS WO CONTRAST  Result Date: 02/05/2022 CLINICAL DATA:  LEFT lower quadrant abdominal pain EXAM: CT ABDOMEN AND PELVIS WITHOUT CONTRAST TECHNIQUE: Multidetector CT imaging of the abdomen and pelvis was performed following the standard protocol without IV contrast. IV contrast was not utilized since patient is allergic to contrast material. Sagittal and coronal MPR images reconstructed from axial data set. RADIATION DOSE REDUCTION: This exam was performed according to the departmental dose-optimization program which includes automated exposure control, adjustment of the mA and/or kV according to patient size and/or use of iterative reconstruction technique. COMPARISON:  11/13/2019 FINDINGS: Lower chest: LEFT lower lobe nodule 5 mm image 17 unchanged. Progressive bibasilar subpleural interstitial thickening. Trace LEFT pleural effusion. Hepatobiliary: Small hepatic cysts. Radiodense liver. Gallbladder unremarkable. Pancreas: Atrophic pancreas. Cystic lesion at uncinate process 13 x 19 x 16 mm, unchanged. Spleen: Normal appearance Adrenals/Urinary Tract: Adrenal glands normal appearance. Unremarkable RIGHT kidney. Multiple LEFT renal calculi up to 18 mm and 19 mm in greatest sizes. 2.5 cm peripelvic cyst. Mild dilatation of LEFT renal  pelvis with peripelvic stranding. No ureteral calcification or dilatation. Bladder unremarkable. Stomach/Bowel: Diverticulosis of descending and sigmoid colon without evidence of diverticulitis. Large hiatal hernia with questionable wall thickening of the stomach versus artifact from underdistention. Stool throughout colon. Remaining bowel loops unremarkable. Tiny appendiceal stump noted post appendectomy. Vascular/Lymphatic: Atherosclerotic calcifications aorta, iliac arteries, coronary arteries. Dense mitral annular calcification. Post AVR. Atherosclerotic calcification versus graft at proximal ascending thoracic aorta. Enlargement of cardiac chambers. No adenopathy. Reproductive: Uterus surgically absent. Nonvisualization of ovaries. Other: No free air or free fluid.  No hernia. Musculoskeletal: Significant enlargement and hyperdensity of LEFT rectus abdominus 9.2 x 3.9 x 8.6 cm consistent with rectus sheath hematoma. This minimally osseous demineralization. Degenerative disc and facet disease changes of lumbar spine with levoconvex lumbar scoliosis. Extends into the LEFT flank muscular planes IMPRESSION: LEFT rectus sheath hematoma 9.2 x 3.9 x 8.6 cm. Distal colonic diverticulosis without evidence of diverticulitis. Large hiatal hernia with questionable wall thickening of the stomach versus artifact from underdistention. Multiple LEFT renal calculi up to 18 mm in greatest size. Slightly increased dilatation of LEFT renal pelvis with peripelvic stranding, without UPJ calculus or ureteral dilatation; this likely reflects chronic infection, recommend correlation with urinalysis. Stable cystic lesion  at uncinate process of the pancreas 13 x 19 x 16 mm; based on stability and patient age, no follow-up imaging recommended. Stable 5 mm LEFT lower lobe pulmonary nodule, unchanged since 2017. Progressive bibasilar subpleural interstitial thickening question interstitial lung disease. Aortic Atherosclerosis (ICD10-I70.0).  Electronically Signed   By: Lavonia Dana M.D.   On: 02/05/2022 09:15   DG Chest 2 View  Result Date: 02/05/2022 CLINICAL DATA:  Pain to the left chest EXAM: CHEST - 2 VIEW COMPARISON:  06/17/2020 FINDINGS: Low volume chest. Retrocardiac density related to a hiatal hernia based on lateral image. Cardiomegaly. Prior AV valve replacement. There is no edema, consolidation, effusion, or pneumothorax. Generalized osteopenic appearance. IMPRESSION: 1. Cardiomegaly without edema. 2. Hiatal hernia. Electronically Signed   By: Jorje Guild M.D.   On: 02/05/2022 06:49    Scheduled Meds:  amiodarone  200 mg Oral Once per day on Mon Tue Wed Thu Fri Sat   carbidopa-levodopa  2 tablet Oral TID   docusate sodium  100 mg Oral BID   loratadine  10 mg Oral Daily   melatonin  5 mg Oral QHS   pantoprazole  40 mg Oral Daily   simvastatin  20 mg Oral QPM   Warfarin - Pharmacist Dosing Inpatient   Does not apply q1600   Continuous Infusions:  methocarbamol (ROBAXIN) IV      LOS: 0 days   Time spent: 56min  Tavonna Worthington C Yarithza Mink, DO [facility]  If 7PM-7AM, please contact night-coverage [email or web address]  02/06/2022, 12:25 PM

## 2022-02-06 NOTE — Evaluation (Signed)
Physical Therapy Evaluation Patient Details Name: [name] MRN: [id number] DOB: [birth year] Today's Date: 02/06/2022  History of Present Illness  81 y.o. female with medical history significant of remote breast cancer;  HTN; HLD; afib on Coumadin; Parkinson's; and AVR presenting with chest and abdominal pain.  She reports left sided chest and abdominal pain x 2-3 days.  Clinical Impression   Pt admitted with above diagnosis.  PTA she lives at home with her spouse, who is currently able to provide assist as needed.  Can stay on main level of home, 2 steps to enter; walks with RW at baseline; The spouse's two biggest areas of focus are increasing difficulty boosting her higher in the bed at night, and assisting her with supine to sit; Presents to PT with generalized weakness, significant fatigue, and functional decline; Pt currently with functional limitations due to the deficits listed below (see PT Problem List). Pt will benefit from skilled PT to increase their independence and safety with mobility to allow discharge to the venue listed below.          Recommendations for follow up therapy are one component of a multi-disciplinary discharge planning process, led by the attending physician.  Recommendations may be updated based on patient status, additional functional criteria and insurance authorization.  Follow Up Recommendations Home health PT    Assistance Recommended at Discharge Frequent or constant Supervision/Assistance  Patient can return home with the following  A lot of help with walking and/or transfers;Help with stairs or ramp for entrance    Equipment Recommendations Other (comment) (Consider shower chair -- will defer to OT)  Recommendations for Other Services  OT consult (as ordered)    Functional Status Assessment Patient has had a recent decline in their functional status and demonstrates the ability to make significant improvements in function in a reasonable  and predictable amount of time.     Precautions / Restrictions Precautions Precautions: Fall Precaution Comments: Patient with Progressive Supranuclear Palsy Restrictions Weight Bearing Restrictions: No Other Position/Activity Restrictions: L abdominal/flank pain.      Mobility  Bed Mobility Overal bed mobility: Needs Assistance Bed Mobility: Sit to Supine       Sit to supine: Mod assist        Transfers Overall transfer level: Needs assistance Equipment used: Rolling walker (2 wheels), 2 person hand held assist Transfers: Sit to/from Stand, Bed to chair/wheelchair/BSC Sit to Stand: Mod assist, +2 safety/equipment Stand pivot transfers: Mod assist         General transfer comment: Light mod assist to power up and support; good anterior weight shift and use of UEs to push up from armrests    Ambulation/Gait Ambulation/Gait assistance: Min assist, Max assist, +2 physical assistance, +2 safety/equipment Gait Distance (Feet): 6 Feet Assistive device: Rolling walker (2 wheels) Gait Pattern/deviations: Step-to pattern, Decreased step length - right, Decreased step length - left, Decreased stance time - right, Decreased stance time - left, Festinating, Trunk flexed       General Gait Details: Short, festinating-like steps, especially with turning; first few seps only needing min assist to steady, however fatigued quickly leading to need for max assist and to pull teh chaiir up to allow her to sit; tehn performed a basic step pivot transfer chair to bed  Stairs            Wheelchair Mobility    Modified Rankin (Stroke Patients Only)       Balance  Pertinent Vitals/Pain Pain Assessment Pain Assessment: Faces Faces Pain Scale: Hurts even more Pain Location: L side of chest and trunk with going sit to supine Pain Descriptors / Indicators: Grimacing Pain Intervention(s): Monitored during session     Home Living Family/patient expects to be discharged to:: Private residence Living Arrangements: Spouse/significant other Available Help at Discharge: Family;Available 24 hours/day Type of Home: House Home Access: Stairs to enter Entrance Stairs-Rails: None Entrance Stairs-Number of Steps: 2   Home Layout: Two level;Able to live on main level with bedroom/bathroom Home Equipment: Rollator (4 wheels);Grab bars - toilet;Grab bars - tub/shower;Electric scooter;Wheelchair - manual      Prior Function Prior Level of Function : Needs assist       Physical Assist : ADLs (physical)   ADLs (physical): Bathing;Dressing;Toileting;IADLs Mobility Comments: Spouse's biggest barrier is boosting his spouse up higher in the bed at night, and getting her from supine to sit; typically walks with cane or RW; goes up stairs with spuse assist/support on one side, and cane on other side ADLs Comments: Husband assists as needed.  They have established a bathing/dressing routine, and night time bathroom routine that works well for them currenlty.     Hand Dominance   Dominant Hand: Right    Extremity/Trunk Assessment   Upper Extremity Assessment Upper Extremity Assessment: Defer to OT evaluation    Lower Extremity Assessment Lower Extremity Assessment: Generalized weakness    Cervical / Trunk Assessment Cervical / Trunk Assessment: Kyphotic  Communication   Communication: No difficulties  Cognition Arousal/Alertness: Awake/alert Behavior During Therapy: WFL for tasks assessed/performed Overall Cognitive Status: Within Functional Limits for tasks assessed                                          General Comments General comments (skin integrity, edema, etc.): Discussed technqiues/use of gait belt to assist pt    Exercises     Assessment/Plan    PT Assessment Patient needs continued PT services  PT Problem List Decreased strength;Decreased range of motion;Decreased  activity tolerance;Decreased balance;Decreased mobility;Decreased coordination;Decreased knowledge of use of DME;Decreased knowledge of precautions       PT Treatment Interventions DME instruction;Gait training;Stair training;Functional mobility training;Therapeutic activities;Therapeutic exercise;Balance training;Patient/family education    PT Goals (Current goals can be found in the Care Plan section)  Acute Rehab PT Goals Patient Stated Goal: Did not state, but hapy to be back in bed at end of session PT Goal Formulation: With patient/family Time For Goal Achievement: 02/20/22 Potential to Achieve Goals: Fair    Frequency Min 3X/week     Co-evaluation               AM-PAC PT "6 Clicks" Mobility  Outcome Measure   Help needed moving from lying on your back to sitting on the side of a flat bed without using bedrails?: A Lot Help needed moving to and from a bed to a chair (including a wheelchair)?: A Lot Help needed standing up from a chair using your arms (e.g., wheelchair or bedside chair)?: A Lot Help needed to walk in hospital room?: Total Help needed climbing 3-5 steps with a railing? : Total 6 Click Score: 8    End of Session Equipment Utilized During Treatment: Gait belt Activity Tolerance: Patient tolerated treatment well Patient left: in bed;with family/visitor present;Other (comment) (with OT initiating session) Nurse Communication: Mobility status PT Visit Diagnosis: Unsteadiness on  feet (R26.81);Other abnormalities of gait and mobility (R26.89);Muscle weakness (generalized) (M62.81);Other symptoms and signs involving the nervous system (R29.898)    Time: 9833-8250 PT Time Calculation (min) (ACUTE ONLY): 17 min   Charges:   PT Evaluation $PT Eval Moderate Complexity: 1 Mod          Roney Marion, [place]  Acute Rehabilitation Services Pager [phone] Office [phone]   [place]ta [place] 02/06/2022, 5:28 PM

## 2022-02-06 NOTE — Evaluation (Signed)
Occupational Therapy Evaluation Patient Details Name: [name] MRN: [id number] DOB: 1941-07-09 Today's Date: 02/06/2022   History of Present Illness 81 y.o. female with medical history significant of remote breast cancer;  HTN; HLD; afib on Coumadin; Parkinson's; and AVR presenting with chest and abdominal pain.  She reports left sided chest and abdominal pain x 2-3 days.   Clinical Impression   Patient admitted for the diagnosis above.  PTA she lives at home with her spouse, who is currently able to provide assist as needed.  The spouse's two biggest areas of focus are increasing difficulty boosting her higher in the bed at night, and assisting her with supine to sit.  OT discussed and showed him home based sliding sheets, and home bed rails that can be attached to their bed and used.  OT also demonstrated the use of sheets under her to assist with rolling.  The spouse is very appreciative of any ideas to assist.  OT will follow in the acute setting, but HH OT can be considered for establishment of a HEP, and to assist with DME in the home to compensate for her progressive disease.       Recommendations for follow up therapy are one component of a multi-disciplinary discharge planning process, led by the attending physician.  Recommendations may be updated based on patient status, additional functional criteria and insurance authorization.   Follow Up Recommendations  Home health OT    Assistance Recommended at Discharge Intermittent Supervision/Assistance  Patient can return home with the following      Functional Status Assessment  Patient has had a recent decline in their functional status and demonstrates the ability to make significant improvements in function in a reasonable and predictable amount of time.  Equipment Recommendations  None recommended by OT    Recommendations for Other Services       Precautions / Restrictions Precautions Precautions:  Fall Precaution Comments: Patient with Progressive Supranuclear Palsy Restrictions Weight Bearing Restrictions: No Other Position/Activity Restrictions: L abdominal/flank pain.      Mobility Bed Mobility Overal bed mobility: Needs Assistance Bed Mobility: Supine to Sit, Sit to Supine     Supine to sit: Mod assist Sit to supine: Mod assist        Transfers                   General transfer comment: NT      Balance                                           ADL either performed or assessed with clinical judgement   ADL                                         General ADL Comments: Patient just returned to bed post PT eval.  Unable to fully assess ADL status.     Vision Baseline Vision/History: 1 Wears glasses Patient Visual Report: No change from baseline       Perception Perception Perception: Not tested   Praxis Praxis Praxis: Not tested    Pertinent Vitals/Pain Pain Assessment Pain Assessment: Faces Faces Pain Scale: Hurts little more Pain Location: L side of chest with arm AROM Pain Descriptors / Indicators: Tender     Hand Dominance Right  Extremity/Trunk Assessment Upper Extremity Assessment Upper Extremity Assessment: Generalized weakness   Lower Extremity Assessment Lower Extremity Assessment: Defer to PT evaluation   Cervical / Trunk Assessment Cervical / Trunk Assessment: Kyphotic   Communication Communication Communication: No difficulties   Cognition Arousal/Alertness: Awake/alert Behavior During Therapy: WFL for tasks assessed/performed Overall Cognitive Status: Within Functional Limits for tasks assessed                                       General Comments   VSS on RA    Exercises     Shoulder Instructions      Home Living Family/patient expects to be discharged to:: Private residence Living Arrangements: Spouse/significant other Available Help at Discharge:  Family;Available 24 hours/day Type of Home: House       Home Layout: Two level;Able to live on main level with bedroom/bathroom     Bathroom Shower/Tub: Occupational psychologist: Handicapped height Bathroom Accessibility: Yes How Accessible: Accessible via walker Home Equipment: Rollator (4 wheels);Grab bars - toilet;Grab bars - tub/shower;Electric scooter;Wheelchair - manual          Prior Functioning/Environment Prior Level of Function : Needs assist       Physical Assist : ADLs (physical)   ADLs (physical): Bathing;Dressing;Toileting;IADLs Mobility Comments: Spouse's biggest barrier is boosting his spouse up higher in the bed at night, and getting her from supine to sit. ADLs Comments: Husband assists as needed.  They have established a bathing/dressing routine, and night time bathroom routine that works well for them currenlty.        OT Problem List: Pain;Impaired balance (sitting and/or standing);Decreased activity tolerance      OT Treatment/Interventions: Self-care/ADL training;Balance training;Therapeutic activities;DME and/or AE instruction    OT Goals(Current goals can be found in the care plan section) Acute Rehab OT Goals Patient Stated Goal: Return home OT Goal Formulation: With patient Time For Goal Achievement: 02/20/22 Potential to Achieve Goals: Good ADL Goals Pt Will Perform Lower Body Dressing: with supervision;sit to/from stand Pt Will Transfer to Toilet: with supervision;ambulating;regular height toilet Additional ADL Goal #1: Patient will need Min guard for supine to sit with use of SR's.  OT Frequency: Min 2X/week    Co-evaluation              AM-PAC OT "6 Clicks" Daily Activity     Outcome Measure Help from another person eating meals?: A Little Help from another person taking care of personal grooming?: A Little Help from another person toileting, which includes using toliet, bedpan, or urinal?: A Lot Help from another  person bathing (including washing, rinsing, drying)?: A Lot Help from another person to put on and taking off regular upper body clothing?: A Little Help from another person to put on and taking off regular lower body clothing?: A Lot 6 Click Score: 15   End of Session    Activity Tolerance: Patient limited by fatigue Patient left: in bed;with call bell/phone within reach;with bed alarm set;with family/visitor present  OT Visit Diagnosis: Unsteadiness on feet (R26.81);Muscle weakness (generalized) (M62.81);Pain                Time: 1440-1500 OT Time Calculation (min): 20 min Charges:  OT General Charges $OT Visit: 1 Visit OT Evaluation $OT Eval Moderate Complexity: 1 Mod  02/06/2022  RP, OTR/L  Acute Rehabilitation Services  Office:  [phone]   Metta Clines 02/06/2022,  3:33 PM

## 2022-02-07 DIAGNOSIS — Z923 Personal history of irradiation: Secondary | ICD-10-CM | POA: Diagnosis not present

## 2022-02-07 DIAGNOSIS — Z8249 Family history of ischemic heart disease and other diseases of the circulatory system: Secondary | ICD-10-CM | POA: Diagnosis not present

## 2022-02-07 DIAGNOSIS — Z853 Personal history of malignant neoplasm of breast: Secondary | ICD-10-CM | POA: Diagnosis not present

## 2022-02-07 DIAGNOSIS — S301XXA Contusion of abdominal wall, initial encounter: Secondary | ICD-10-CM | POA: Diagnosis not present

## 2022-02-07 DIAGNOSIS — M47812 Spondylosis without myelopathy or radiculopathy, cervical region: Secondary | ICD-10-CM | POA: Diagnosis present

## 2022-02-07 DIAGNOSIS — E7800 Pure hypercholesterolemia, unspecified: Secondary | ICD-10-CM | POA: Diagnosis present

## 2022-02-07 DIAGNOSIS — Z9012 Acquired absence of left breast and nipple: Secondary | ICD-10-CM | POA: Diagnosis not present

## 2022-02-07 DIAGNOSIS — N1832 Chronic kidney disease, stage 3b: Secondary | ICD-10-CM | POA: Diagnosis present

## 2022-02-07 DIAGNOSIS — Z888 Allergy status to other drugs, medicaments and biological substances status: Secondary | ICD-10-CM | POA: Diagnosis not present

## 2022-02-07 DIAGNOSIS — M7981 Nontraumatic hematoma of soft tissue: Secondary | ICD-10-CM | POA: Diagnosis present

## 2022-02-07 DIAGNOSIS — K5900 Constipation, unspecified: Secondary | ICD-10-CM | POA: Diagnosis present

## 2022-02-07 DIAGNOSIS — Z20822 Contact with and (suspected) exposure to covid-19: Secondary | ICD-10-CM | POA: Diagnosis present

## 2022-02-07 DIAGNOSIS — I480 Paroxysmal atrial fibrillation: Secondary | ICD-10-CM | POA: Diagnosis present

## 2022-02-07 DIAGNOSIS — Z91048 Other nonmedicinal substance allergy status: Secondary | ICD-10-CM | POA: Diagnosis not present

## 2022-02-07 DIAGNOSIS — I130 Hypertensive heart and chronic kidney disease with heart failure and stage 1 through stage 4 chronic kidney disease, or unspecified chronic kidney disease: Secondary | ICD-10-CM | POA: Diagnosis present

## 2022-02-07 DIAGNOSIS — Z885 Allergy status to narcotic agent status: Secondary | ICD-10-CM | POA: Diagnosis not present

## 2022-02-07 DIAGNOSIS — Z91041 Radiographic dye allergy status: Secondary | ICD-10-CM | POA: Diagnosis not present

## 2022-02-07 DIAGNOSIS — Z9221 Personal history of antineoplastic chemotherapy: Secondary | ICD-10-CM | POA: Diagnosis not present

## 2022-02-07 DIAGNOSIS — Z952 Presence of prosthetic heart valve: Secondary | ICD-10-CM | POA: Diagnosis not present

## 2022-02-07 DIAGNOSIS — N390 Urinary tract infection, site not specified: Secondary | ICD-10-CM | POA: Diagnosis present

## 2022-02-07 DIAGNOSIS — G20 Parkinson's disease: Secondary | ICD-10-CM | POA: Diagnosis present

## 2022-02-07 DIAGNOSIS — D631 Anemia in chronic kidney disease: Secondary | ICD-10-CM | POA: Diagnosis present

## 2022-02-07 DIAGNOSIS — K219 Gastro-esophageal reflux disease without esophagitis: Secondary | ICD-10-CM | POA: Diagnosis present

## 2022-02-07 DIAGNOSIS — G231 Progressive supranuclear ophthalmoplegia [Steele-Richardson-Olszewski]: Secondary | ICD-10-CM | POA: Diagnosis present

## 2022-02-07 DIAGNOSIS — N179 Acute kidney failure, unspecified: Secondary | ICD-10-CM | POA: Diagnosis present

## 2022-02-07 DIAGNOSIS — G8929 Other chronic pain: Secondary | ICD-10-CM | POA: Diagnosis present

## 2022-02-07 LAB — BASIC METABOLIC PANEL
Anion gap: 11 (ref 5–15)
Anion gap: 12 (ref 5–15)
BUN: 45 mg/dL — ABNORMAL HIGH (ref 8–23)
BUN: 50 mg/dL — ABNORMAL HIGH (ref 8–23)
CO2: 25 mmol/L (ref 22–32)
CO2: 23 mmol/L (ref 22–32)
Calcium: 8.8 mg/dL — ABNORMAL LOW (ref 8.9–10.3)
Calcium: 8.2 mg/dL — ABNORMAL LOW (ref 8.9–10.3)
Chloride: 97 mmol/L — ABNORMAL LOW (ref 98–111)
Chloride: 93 mmol/L — ABNORMAL LOW (ref 98–111)
Creatinine, Ser: 4.92 mg/dL — ABNORMAL HIGH (ref 0.44–1.00)
Creatinine, Ser: 5.12 mg/dL — ABNORMAL HIGH (ref 0.44–1.00)
GFR, Estimated: 8 mL/min — ABNORMAL LOW (ref >60)
GFR, Estimated: 8 mL/min — ABNORMAL LOW (ref >60)
Glucose, Bld: 112 mg/dL — ABNORMAL HIGH (ref 70–99)
Glucose, Bld: 134 mg/dL — ABNORMAL HIGH (ref 70–99)
Potassium: 5.5 mmol/L — ABNORMAL HIGH (ref 3.5–5.1)
Potassium: 4.9 mmol/L (ref 3.5–5.1)
Sodium: 133 mmol/L — ABNORMAL LOW (ref 135–145)
Sodium: 128 mmol/L — ABNORMAL LOW (ref 135–145)

## 2022-02-07 LAB — PROTIME-INR
INR: 4.5 (ref 0.8–1.2)
Prothrombin Time: 42.9 seconds — ABNORMAL HIGH (ref 11.4–15.2)

## 2022-02-07 LAB — URINE CULTURE

## 2022-02-07 LAB — HEMOGLOBIN AND HEMATOCRIT, BLOOD
HCT: 18.7 % — ABNORMAL LOW (ref 36.0–46.0)
HCT: 20.4 % — ABNORMAL LOW (ref 36.0–46.0)
Hemoglobin: 6.1 g/dL — CL (ref 12.0–15.0)
Hemoglobin: 6.5 g/dL — CL (ref 12.0–15.0)

## 2022-02-07 LAB — PREPARE RBC (CROSSMATCH)

## 2022-02-07 MED ORDER — SODIUM CHLORIDE 0.9% IV SOLUTION: Freq: Once | INTRAVENOUS | Status: AC

## 2022-02-07 MED ORDER — SODIUM ZIRCONIUM CYCLOSILICATE 5 G PO PACK
5.0000 g | PACK | ORAL | Status: AC
  Administered 2022-02-07: 5 g via ORAL
  Filled 2022-02-07: qty 1

## 2022-02-07 NOTE — Progress Notes (Signed)
Date and time results received: 02/07/22 0632  Reported by: Prudence Davidson, Lab Reported to: Grayce Sessions, RN   Test: hemoglobin   Critical Value: 6.1  Name of Provider Notified: Dr. Myna Hidalgo  Orders Received or Actions Taken?:  MD notified, awaiting orders at this time

## 2022-02-07 NOTE — Progress Notes (Signed)
ANTICOAGULATION CONSULT NOTE - follow-up ° °Pharmacy Consult for warfarin °Indication: MAVR, afib ° °Allergies  °Allergen Reactions  ° Demerol [Meperidine] Shortness Of Breath and Swelling  ° Oxycodone Other (See Comments)  °  Hallucinations   ° Adhesive [Tape] Other (See Comments)  °  Redness and skin tears  ° Ivp Dye [Iodinated Contrast Media] Hives  °  OK with benadryl pre-med (50mg one hour before receiving iodinated contrast agent)  ° Phenergan [Promethazine] Other (See Comments)  °  Avoids due to reaction with current medications  ° ° °Patient Measurements: °Height: 4' 11" (149.9 cm) °Weight: 57.2 kg (126 lb 1.7 oz) °IBW/kg (Calculated) : 43.2 ° ° °Vital Signs: °Temp: 98.5 °F (36.9 °C) (02/12 0541) °Temp Source: Oral (02/12 0541) °BP: 119/61 (02/12 0541) °Pulse Rate: 97 (02/12 0541) ° °Labs: °Recent Labs  °  02/05/22 °0629 02/05/22 °0823 02/06/22 °0113 02/06/22 °1209 02/07/22 °0556  °HGB 10.8*  --  7.5* 7.1* 6.1*  °HCT 34.1*  --  23.0* 22.2* 18.7*  °PLT 211  --  183  --   --   °LABPROT 32.4*  --  33.7*  --  42.9*  °INR 3.2*  --  3.3*  --  4.5*  °CREATININE 1.94*  --  2.44*  --  4.92*  °TROPONINIHS 23* 23*  --   --   --   ° ° ° °Estimated Creatinine Clearance: 6.9 mL/min (A) (by C-G formula based on SCr of 4.92 mg/dL (H)). 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 CHA2DS2VASc = 4-->coumadin.  °  Parkinsonism (HCC)   ° neurologist-  dr tat  ° PONV (postoperative nausea and vomiting)   ° S/P aortic valve replacement with prosthetic valve   ° a. 04/13/2006 s/p St Jude mechnical AVR for severe AS;  b. 09/2014 Echo: EF 40-45%, gr1 DD, mild AI/MR, triv TR/PR.  ° SUI (stress urinary incontinence, female)   ° Symptomatic anemia   ° Venous varices    ° ° °Assessment: °80 YOF presenting with chest pain, has rectus sheath hematoma, she is on warfarin PTA for mechanical AVR, INR on admission 3.2, last dose 2/9.  ° °PTA dosing: 5mg MWF, 2.5mg every other day ° °2/10- INR of 3.2- warfarin 2.5 mg po given  ° °2/11- INR 3.3 ° °2/12- INR 4.5, Hemoglobin 6.1  Provider is aware and has ordered PRBC's followed by a repeat H/H. Spoke with Levi (patient's nurse). He stated that the hematoma is about the same as yesterday.  ° °Goal of Therapy:  °INR 2.5-3.5  usually, per MD target 2-3 for now with current hematoma °Monitor platelets by anticoagulation protocol: Yes °  °Plan:  °Warfarin 0 mg PO x 1 today °Daily INR, s/s bleeding ° °Earle Reome BS, PharmD, BCPS °Clinical Pharmacist °02/07/2022   7:07 AM

## 2022-02-07 NOTE — Progress Notes (Signed)
PROGRESS NOTE    [name]  ZOX:[id number] DOB: [birth year] DOA: 02/05/2022 PCP: Donnajean Lopes, MD   Brief Narrative:  [name] is a 81 y.o. female with medical history significant of remote breast cancer;  HTN; HLD; afib/mechanical valve on Coumadin; Parkinson's; and AVR presenting with pelvic/abdominal pain. Found to have moderate size left rectus sheath hematoma.  Worsening anemia and admitted for close observation and possible transfusion.   Assessment & Plan:   Principal Problem:   Rectus sheath hematoma, initial encounter Active Problems:   S/P AVR (aortic valve replacement)   HTN (hypertension)   Hypercholesteremia   Chronic anticoagulation   Symptomatic anemia   CKD (chronic kidney disease), stage III (HCC)   Parkinsonism (HCC)   PSP (progressive supranuclear palsy) (HCC)   Paroxysmal atrial fibrillation (HCC)   Rectus sheath hematoma, initial encounter- (present on admission) Symptomatic anemia, POA -Unclear etiology - denies trauma, possibly spontaneous in the setting of anticoagulation -Hemoglobin continues to downtrend -1u PRBC 02/07/22 - repeat hgb pending this afternoon -Continue anticoagulation with warfarin due to mechanical valve per pharmacy -INR -remains elevated today -Surgery was consulted -no indication for surgery   Paroxysmal atrial fibrillation ([place])- (present on admission) Mechanical aortic valve -Continue amiodarone (cardiology is trying to wean the dose) -Continue Coumadin -pharmacy following INR; remains elevated today   PSP (progressive supranuclear palsy) ([place])- (present on admission) -With parkinsonism features -Continue Sinemet   AKI on CKD (chronic kidney disease), stage IIIb Hyperkalemia -Worsening creatinine over the past 24h likely secondary to worsening anemia -Continue to advance diet increase fluid intake -Pending repeat labs, if patient does not require repeat transfusion will start IV fluids until  p.o. intake improves   UTI (urinary tract infection)- (present on admission) -Ruled out, no signs or symptoms of UTI at this point patient denies hesitancy urgency or dysuria   Hypercholesteremia- (present on admission) -Continue Zocor   HTN (hypertension)- (present on admission) -She is not on chronic meds for this and just takes prn lopressor for HR -Will order prn IV hydralazine    DVT prophylaxis: Warfarin Code Status: Full Family Communication: At bedside  Status is: Inpatient  Dispo: The patient is from: Home              Anticipated d/c is to: Home              Anticipated d/c date is: 48 to 72 hours              Patient currently not medically stable for discharge  Consultants:  None  Procedures:  None  Antimicrobials:  None  Subjective: No acute issues or events overnight abdomen/groin pain improving, denies nausea vomiting diarrhea constipation headache fevers chills or chest pain  Objective: Vitals:   02/06/22 0427 02/06/22 1651 02/06/22 2000 02/07/22 0541  BP: (!) 82/61 (!) 102/32 108/66 119/61  Pulse: 90 (!) 105 97 97  Resp:  18 18 18   Temp: 98.6 F (37 C) 98.3 F (36.8 C) 98.8 F (37.1 C) 98.5 F (36.9 C)  TempSrc: Oral Oral Oral Oral  SpO2: 99% 90% 95% 90%  Weight:      Height:        Intake/Output Summary (Last 24 hours) at 02/07/2022 0713 Last data filed at 02/07/2022 0552 Gross per 24 hour  Intake 240 ml  Output 1 ml  Net 239 ml    Filed Weights   02/05/22 0617 02/05/22 1942  Weight: 54 kg 57.2 kg    Examination:  General:  Pleasantly resting in bed, No acute distress. HEENT:  Normocephalic atraumatic.  Sclerae nonicteric, noninjected.  Extraocular movements intact bilaterally. Neck:  Without mass or deformity.  Trachea is midline. Lungs:  Clear to auscultate bilaterally without rhonchi, wheeze, or rales. Heart:  Regular rate and rhythm.  Without murmurs, rubs, or gallops. Abdomen:  Soft, nontender, nondistended.  Without  guarding or rebound. Extremities: Without cyanosis, clubbing, edema, or obvious deformity. Vascular:  Dorsalis pedis and posterior tibial pulses palpable bilaterally. Skin:  Warm and dry, no erythema, no ulcerations.    Data Reviewed: I have personally reviewed following labs and imaging studies  CBC: Recent Labs  Lab 02/05/22 0629 02/06/22 0113 02/06/22 1209 02/07/22 0556  WBC 6.5 7.9  --   --   HGB 10.8* 7.5* 7.1* 6.1*  HCT 34.1* 23.0* 22.2* 18.7*  MCV 103.3* 102.2*  --   --   PLT 211 183  --   --     Basic Metabolic Panel: Recent Labs  Lab 02/05/22 0629 02/06/22 0113 02/07/22 0556  NA 136 137 133*  K 4.7 5.1 5.5*  CL 100 100 97*  CO2 27 27 25   GLUCOSE 99 138* 112*  BUN 28* 33* 45*  CREATININE 1.94* 2.44* 4.92*  CALCIUM 9.5 8.8* 8.8*    GFR: Estimated Creatinine Clearance: 6.9 mL/min (A) (by C-G formula based on SCr of 4.92 mg/dL (H)). Liver Function Tests: Recent Labs  Lab 02/05/22 0629  AST 39  ALT <5  ALKPHOS 79  BILITOT 1.0  PROT 6.0*  ALBUMIN 3.3*    Recent Labs  Lab 02/05/22 0629  LIPASE 27    No results for input(s): AMMONIA in the last 168 hours. Coagulation Profile: Recent Labs  Lab 02/05/22 0629 02/06/22 0113 02/07/22 0556  INR 3.2* 3.3* 4.5*    Cardiac Enzymes: No results for input(s): CKTOTAL, CKMB, CKMBINDEX, TROPONINI in the last 168 hours. BNP (last 3 results) No results for input(s): PROBNP in the last 8760 hours. HbA1C: No results for input(s): HGBA1C in the last 72 hours. CBG: No results for input(s): GLUCAP in the last 168 hours. Lipid Profile: No results for input(s): CHOL, HDL, LDLCALC, TRIG, CHOLHDL, LDLDIRECT in the last 72 hours. Thyroid Function Tests: No results for input(s): TSH, T4TOTAL, FREET4, T3FREE, THYROIDAB in the last 72 hours. Anemia Panel: No results for input(s): VITAMINB12, FOLATE, FERRITIN, TIBC, IRON, RETICCTPCT in the last 72 hours. Sepsis Labs: No results for input(s): PROCALCITON,  LATICACIDVEN in the last 168 hours.  Recent Results (from the past 240 hour(s))  Resp Panel by RT-PCR (Flu A&B, Covid) Nasopharyngeal Swab     Status: None   Collection Time: 02/05/22  9:45 AM   Specimen: Nasopharyngeal Swab; Nasopharyngeal(NP) swabs in vial transport medium  Result Value Ref Range Status   SARS Coronavirus 2 by RT PCR NEGATIVE NEGATIVE Final    Comment: (NOTE) SARS-CoV-2 target nucleic acids are NOT DETECTED.  The SARS-CoV-2 RNA is generally detectable in upper respiratory specimens during the acute phase of infection. The lowest concentration of SARS-CoV-2 viral copies this assay can detect is 138 copies/mL. A negative result does not preclude SARS-Cov-2 infection and should not be used as the sole basis for treatment or other patient management decisions. A negative result may occur with  improper specimen collection/handling, submission of specimen other than nasopharyngeal swab, presence of viral mutation(s) within the areas targeted by this assay, and inadequate number of viral copies(<138 copies/mL). A negative result must be combined with clinical observations, patient history,  and epidemiological information. The expected result is Negative.  Fact Sheet for Patients:  EntrepreneurPulse.com.au  Fact Sheet for Healthcare Providers:  IncredibleEmployment.be  This test is no t yet approved or cleared by the [place] FDA and  has been authorized for detection and/or diagnosis of SARS-CoV-2 by FDA under an Emergency Use Authorization (EUA). This EUA will remain  in effect (meaning this test can be used) for the duration of the COVID-19 declaration under Section 564(b)(1) of the Act, 21 U.S.C.section 360bbb-3(b)(1), unless the authorization is terminated  or revoked sooner.       Influenza A by PCR NEGATIVE NEGATIVE Final   Influenza B by PCR NEGATIVE NEGATIVE Final    Comment: (NOTE) The Xpert Xpress  SARS-CoV-2/FLU/RSV plus assay is intended as an aid in the diagnosis of influenza from Nasopharyngeal swab specimens and should not be used as a sole basis for treatment. Nasal washings and aspirates are unacceptable for Xpert Xpress SARS-CoV-2/FLU/RSV testing.  Fact Sheet for Patients: EntrepreneurPulse.com.au  Fact Sheet for Healthcare Providers: IncredibleEmployment.be  This test is not yet approved or cleared by the [place] FDA and has been authorized for detection and/or diagnosis of SARS-CoV-2 by FDA under an Emergency Use Authorization (EUA). This EUA will remain in effect (meaning this test can be used) for the duration of the COVID-19 declaration under Section 564(b)(1) of the Act, 21 U.S.C. section 360bbb-3(b)(1), unless the authorization is terminated or revoked.  Performed at [facility], [place] [address] [place], [place] [address]           Radiology Studies: CT ABDOMEN PELVIS WO CONTRAST  Result Date: 02/05/2022 CLINICAL DATA:  LEFT lower quadrant abdominal pain EXAM: CT ABDOMEN AND PELVIS WITHOUT CONTRAST TECHNIQUE: Multidetector CT imaging of the abdomen and pelvis was performed following the standard protocol without IV contrast. IV contrast was not utilized since patient is allergic to contrast material. Sagittal and coronal MPR images reconstructed from axial data set. RADIATION DOSE REDUCTION: This exam was performed according to the departmental dose-optimization program which includes automated exposure control, adjustment of the mA and/or kV according to patient size and/or use of iterative reconstruction technique. COMPARISON:  11/13/2019 FINDINGS: Lower chest: LEFT lower lobe nodule 5 mm image 17 unchanged. Progressive bibasilar subpleural interstitial thickening. Trace LEFT pleural effusion. Hepatobiliary: Small hepatic cysts. Radiodense liver. Gallbladder unremarkable. Pancreas: Atrophic pancreas. Cystic lesion  at uncinate process 13 x 19 x 16 mm, unchanged. Spleen: Normal appearance Adrenals/Urinary Tract: Adrenal glands normal appearance. Unremarkable RIGHT kidney. Multiple LEFT renal calculi up to 18 mm and 19 mm in greatest sizes. 2.5 cm peripelvic cyst. Mild dilatation of LEFT renal pelvis with peripelvic stranding. No ureteral calcification or dilatation. Bladder unremarkable. Stomach/Bowel: Diverticulosis of descending and sigmoid colon without evidence of diverticulitis. Large hiatal hernia with questionable wall thickening of the stomach versus artifact from underdistention. Stool throughout colon. Remaining bowel loops unremarkable. Tiny appendiceal stump noted post appendectomy. Vascular/Lymphatic: Atherosclerotic calcifications aorta, iliac arteries, coronary arteries. Dense mitral annular calcification. Post AVR. Atherosclerotic calcification versus graft at proximal ascending thoracic aorta. Enlargement of cardiac chambers. No adenopathy. Reproductive: Uterus surgically absent. Nonvisualization of ovaries. Other: No free air or free fluid.  No hernia. Musculoskeletal: Significant enlargement and hyperdensity of LEFT rectus abdominus 9.2 x 3.9 x 8.6 cm consistent with rectus sheath hematoma. This minimally osseous demineralization. Degenerative disc and facet disease changes of lumbar spine with levoconvex lumbar scoliosis. Extends into the LEFT flank muscular planes IMPRESSION: LEFT rectus sheath hematoma 9.2 x 3.9  x 8.6 cm. Distal colonic diverticulosis without evidence of diverticulitis. Large hiatal hernia with questionable wall thickening of the stomach versus artifact from underdistention. Multiple LEFT renal calculi up to 18 mm in greatest size. Slightly increased dilatation of LEFT renal pelvis with peripelvic stranding, without UPJ calculus or ureteral dilatation; this likely reflects chronic infection, recommend correlation with urinalysis. Stable cystic lesion at uncinate process of the pancreas 13  x 19 x 16 mm; based on stability and patient age, no follow-up imaging recommended. Stable 5 mm LEFT lower lobe pulmonary nodule, unchanged since 2017. Progressive bibasilar subpleural interstitial thickening question interstitial lung disease. Aortic Atherosclerosis (ICD10-I70.0). Electronically Signed   By: Lavonia Dana M.D.   On: 02/05/2022 09:15    Scheduled Meds:  sodium chloride   Intravenous Once   amiodarone  200 mg Oral Once per day on Mon Tue Wed Thu Fri Sat   carbidopa-levodopa  2 tablet Oral TID   docusate sodium  100 mg Oral BID   loratadine  10 mg Oral Daily   melatonin  5 mg Oral QHS   pantoprazole  40 mg Oral Daily   simvastatin  20 mg Oral QPM   Warfarin - Pharmacist Dosing Inpatient   Does not apply q1600   Continuous Infusions:  methocarbamol (ROBAXIN) IV      LOS: 0 days   Time spent: 39min  Laura Radilla C Inman Fettig, DO [facility]  If 7PM-7AM, please contact night-coverage [email or web address]  02/07/2022, 7:13 AM

## 2022-02-07 NOTE — Progress Notes (Signed)
Date and time results received: 02/07/22 0658  Reported by: Army Melia, Lab Reported to: Grayce Sessions, RN   Test: INR  Critical Value: 4.5  Name of Provider Notified: Dr. Avon Gully  Orders Received or Actions Taken?:  MD notified, awaiting orders

## 2022-02-08 DIAGNOSIS — L8990 Pressure ulcer of unspecified site, unspecified stage: Secondary | ICD-10-CM | POA: Insufficient documentation

## 2022-02-08 LAB — TYPE AND SCREEN
ABO/RH(D): O POS
Antibody Screen: NEGATIVE
Unit division: 0
Unit division: 0
Unit division: 0

## 2022-02-08 LAB — BPAM RBC
Blood Product Expiration Date: 202303032359
Blood Product Expiration Date: 202303042359
Blood Product Expiration Date: 202303042359
ISSUE DATE / TIME: 202302120932
ISSUE DATE / TIME: 202302121526
ISSUE DATE / TIME: 202302122002
Unit Type and Rh: 5100
Unit Type and Rh: 5100
Unit Type and Rh: 5100

## 2022-02-08 LAB — HEMOGLOBIN AND HEMATOCRIT, BLOOD
HCT: 27.3 % — ABNORMAL LOW (ref 36.0–46.0)
HCT: 27.3 % — ABNORMAL LOW (ref 36.0–46.0)
HCT: 27.6 % — ABNORMAL LOW (ref 36.0–46.0)
Hemoglobin: 9.3 g/dL — ABNORMAL LOW (ref 12.0–15.0)
Hemoglobin: 9 g/dL — ABNORMAL LOW (ref 12.0–15.0)
Hemoglobin: 9.3 g/dL — ABNORMAL LOW (ref 12.0–15.0)

## 2022-02-08 LAB — BASIC METABOLIC PANEL
Anion gap: 11 (ref 5–15)
BUN: 50 mg/dL — ABNORMAL HIGH (ref 8–23)
CO2: 23 mmol/L (ref 22–32)
Calcium: 8.3 mg/dL — ABNORMAL LOW (ref 8.9–10.3)
Chloride: 93 mmol/L — ABNORMAL LOW (ref 98–111)
Creatinine, Ser: 4.3 mg/dL — ABNORMAL HIGH (ref 0.44–1.00)
GFR, Estimated: 10 mL/min — ABNORMAL LOW (ref >60)
Glucose, Bld: 109 mg/dL — ABNORMAL HIGH (ref 70–99)
Potassium: 4.6 mmol/L (ref 3.5–5.1)
Sodium: 127 mmol/L — ABNORMAL LOW (ref 135–145)

## 2022-02-08 LAB — PROTIME-INR
INR: 4.5 (ref 0.8–1.2)
Prothrombin Time: 42.6 seconds — ABNORMAL HIGH (ref 11.4–15.2)

## 2022-02-08 NOTE — Progress Notes (Signed)
Occupational Therapy Treatment Patient Details Name: [name] MRN: [id number] DOB: [birth year] Today's Date: 02/08/2022   History of present illness 81 y.o. female with medical history significant of remote breast cancer;  HTN; HLD; afib on Coumadin; Parkinson's; and AVR presenting with chest and abdominal pain.  She reports left sided chest and abdominal pain x 2-3 days.   OT comments  Pt making good progress with functional goals. Session focused on bed mobility, functional mobility using RW  bathroom, toilet transfers, toileting, grooming standing at sink. OT will continue to follow acutely to maximize level of function and safety   Recommendations for follow up therapy are one component of a multi-disciplinary discharge planning process, led by the attending physician.  Recommendations may be updated based on patient status, additional functional criteria and insurance authorization.    Follow Up Recommendations  Home health OT    Assistance Recommended at Discharge Intermittent Supervision/Assistance  Patient can return home with the following  A lot of help with bathing/dressing/bathroom;A little help with walking and/or transfers;Direct supervision/assist for medications management;Assist for transportation;Help with stairs or ramp for entrance   Equipment Recommendations  None recommended by OT    Recommendations for Other Services      Precautions / Restrictions Precautions Precautions: Fall Precaution Comments: Patient with Progressive Supranuclear Palsy Restrictions Weight Bearing Restrictions: No Other Position/Activity Restrictions: L abdominal/flank pain.       Mobility Bed Mobility Overal bed mobility: Needs Assistance Bed Mobility: Sidelying to Sit, Supine to Sit, Sit to Supine, Sit to Sidelying, Rolling Rolling: Min assist Sidelying to sit: Mod assist Supine to sit: Mod assist Sit to supine: Mod assist   General bed mobility comments: Cues for  log roll techqniue, including cues and instruction to roll fully onto side ("stack hips and stack shoulders"); Cues also to bring knees to chest in sidelying ("like you are rolling into a ball") to keep pt from extending hips and making coming up harder; Light mod assist to push up to sit    Transfers Overall transfer level: Needs assistance Equipment used: Rolling walker (2 wheels), 2 person hand held assist Transfers: Sit to/from Stand Sit to Stand: Mod assist Stand pivot transfers: Mod assist               Balance Overall balance assessment: Needs assistance Sitting-balance support: No upper extremity supported, Feet supported Sitting balance-Leahy Scale: Fair     Standing balance support: Bilateral upper extremity supported, During functional activity Standing balance-Leahy Scale: Poor                             ADL either performed or assessed with clinical judgement   ADL Overall ADL's : Needs assistance/impaired Eating/Feeding: Set up;Independent;Sitting   Grooming: Wash/dry hands;Wash/dry face;Min guard;Standing       Lower Body Bathing: Moderate assistance       Lower Body Dressing: Maximal assistance Lower Body Dressing Details (indicate cue type and reason): donning socks Toilet Transfer: Moderate assistance;Ambulation;Rolling walker (2 wheels);Cueing for safety   Toileting- Clothing Manipulation and Hygiene: Maximal assistance;Sit to/from stand       Functional mobility during ADLs: Moderate assistance;Rolling walker (2 wheels)      Extremity/Trunk Assessment Upper Extremity Assessment Upper Extremity Assessment: Generalized weakness   Lower Extremity Assessment Lower Extremity Assessment: Defer to PT evaluation   Cervical / Trunk Assessment Cervical / Trunk Assessment: Kyphotic    Vision Baseline Vision/History: 1 Wears glasses Ability to See  in Adequate Light: 0 Adequate Patient Visual Report: No change from baseline      Perception     Praxis      Cognition Arousal/Alertness: Awake/alert Behavior During Therapy: WFL for tasks assessed/performed Overall Cognitive Status: Within Functional Limits for tasks assessed                                          Exercises      Shoulder Instructions       General Comments      Pertinent Vitals/ Pain       Pain Assessment Pain Assessment: Faces Faces Pain Scale: Hurts little more Pain Location: L side of chest and trunk Pain Descriptors / Indicators: Grimacing, Guarding Pain Intervention(s): Monitored during session, Repositioned  Home Living                                          Prior Functioning/Environment              Frequency  Min 2X/week        Progress Toward Goals  OT Goals(current goals can now be found in the care plan section)  Progress towards OT goals: Progressing toward goals  Acute Rehab OT Goals Patient Stated Goal: go home  Plan Discharge plan remains appropriate;Frequency remains appropriate    Co-evaluation                 AM-PAC OT "6 Clicks" Daily Activity     Outcome Measure                    End of Session Equipment Utilized During Treatment: Gait belt;Rolling walker (2 wheels)  OT Visit Diagnosis: Unsteadiness on feet (R26.81);Muscle weakness (generalized) (M62.81);Pain Pain - Right/Left: Left   Activity Tolerance Patient limited by fatigue   Patient Left in bed;with call bell/phone within reach;with bed alarm set;with family/visitor present   Nurse Communication          Time: 1206-1222 OT Time Calculation (min): 16 min  Charges: OT General Charges $OT Visit: 1 Visit OT Treatments $Therapeutic Activity: 8-22 mins    Britt Bottom 02/08/2022, 4:21 PM

## 2022-02-08 NOTE — TOC Initial Note (Signed)
Transition of Care [facility]) - Initial/Assessment Note    Patient Details  Name: [name] MRN: [id number] Date of Birth: [birth year]  Transition of Care [facility]) CM/SW Contact:    Tom-Johnson, Renea Ee, RN Phone Number: 02/08/2022, 12:21 PM  Clinical Narrative:                  CM spoke with patient and husband, [name] at bedside about needs for post hospital transition. Admitted for Rectus Sheath Hematoma. Patient is on chronic Coumadin for Mechanical Valve. From home with husband and has two daughters whom are very supportive. Has a cane, walker, handicapped bathroom at home.PCP is Donnajean Lopes, MD and uses [facility] on [address]. CM gave patient and huband list of home health agemcies from Medicare.gov and they chose [facility]. Referral made with [facility] and acceptance voiced. Information on AVS. CM will continue to follow with needs.  Expected Discharge Plan: [place] Barriers to Discharge: Continued Medical Work up   Patient Goals and CMS Choice Patient states their goals for this hospitalization and ongoing recovery are:: To return home CMS Medicare.gov Compare Post Acute Care list provided to:: Patient Choice offered to / list presented to : Patient  Expected Discharge Plan and Services Expected Discharge Plan: [place]   Discharge Planning Services: CM Consult Post Acute Care Choice: [place] arrangements for the past 2 months: Single Family Home                           HH Arranged: PT, OT HH Agency: [place] Date [facility] Agency Contacted: 02/08/22 Time HH Agency Contacted: 75 Representative spoke with at [place]: Tommi Rumps  Prior Living Arrangements/Services Living arrangements for the past 2 months: [place] Lives with:: Spouse Patient language and need for interpreter reviewed:: Yes Do you feel safe going back to the place where you live?: Yes      Need for Family  Participation in Patient Care: Yes (Comment) Care giver support system in place?: Yes (comment) Current home services: DME (Cane, walker, handicapped bathroom.) Criminal Activity/Legal Involvement Pertinent to Current Situation/Hospitalization: No - Comment as needed  Activities of Daily Living Home Assistive Devices/Equipment: Environmental consultant (specify type) ADL Screening (condition at time of admission) Patient's cognitive ability adequate to safely complete daily activities?: Yes Is the patient deaf or have difficulty hearing?: No Does the patient have difficulty seeing, even when wearing glasses/contacts?: No Does the patient have difficulty concentrating, remembering, or making decisions?: No Patient able to express need for assistance with ADLs?: No Does the patient have difficulty dressing or bathing?: No Independently performs ADLs?: Yes (appropriate for developmental age) Does the patient have difficulty walking or climbing stairs?: No Weakness of Legs: Left Weakness of Arms/Hands: None  Permission Sought/Granted Permission sought to share information with : Case Manager, Customer service manager, Family Supports Permission granted to share information with : Yes, Verbal Permission Granted  Share Information with NAME: Tommi Rumps  Permission granted to share info w AGENCY: [facility]        Emotional Assessment Appearance:: Appears stated age Attitude/Demeanor/Rapport: Engaged, Gracious Affect (typically observed): Accepting, Appropriate, Calm, Hopeful Orientation: : Oriented to Self, Oriented to Place, Oriented to  Time, Oriented to Situation Alcohol / Substance Use: Not Applicable Psych Involvement: No (comment)  Admission diagnosis:  Acute urinary tract infection [N39.0] Hematoma of rectus sheath, initial encounter [S30.1XXA] Rectus sheath hematoma, initial encounter [S30.1XXA] Patient Active  Problem List   Diagnosis Date Noted   Pressure injury of skin 02/08/2022   Rectus  sheath hematoma, initial encounter 02/05/2022   Paroxysmal atrial fibrillation ([place]) 04/09/2021   Chronic combined systolic and diastolic heart failure ([place]) 07/17/2020   PSP (progressive supranuclear palsy) ([place]) 02/29/2020   Atrial flutter ([place])    Heme positive stool    Symptomatic anemia 12/05/2018   GI bleed 12/05/2018   CKD (chronic kidney disease), stage III ([place]) 12/05/2018   Elevated d-dimer 12/05/2018   SOB (shortness of breath) 12/05/2018   Supratherapeutic INR 12/05/2018   Parkinsonism ([place])    Elevated troponin 09/19/2016   Emesis 09/19/2016   Sinus bradycardia 09/16/2016   Family history of colon cancer 07/30/2014   Hiatal hernia 07/30/2014   Chest pain 10/13/2011   Disequilibrium 10/13/2011   Heart valve replaced by other means 04/16/2011   Chronic anticoagulation 03/17/2011   S/P AVR (aortic valve replacement)    PVC's (premature ventricular contractions)    HTN (hypertension)    Hypercholesteremia    Atrial fibrillation with RVR ([place])    GERD (gastroesophageal reflux disease)    GERD 02/06/2010   DIVERTICULOSIS-COLON 02/06/2010   PERSONAL HX COLONIC POLYPS 02/06/2010   MASTECTOMY, HX OF 02/06/2010   PCP:  Donnajean Lopes, MD Pharmacy:   [facility] [place] [address] [place] ([place])  [address] Phone: [phone] Fax: [phone]     Social Determinants of Health (SDOH) Interventions    Readmission Risk Interventions No flowsheet data found.

## 2022-02-08 NOTE — Progress Notes (Signed)
PROGRESS NOTE    [name]  IOE:[id number] DOB: [birth year] DOA: 02/05/2022 PCP: Donnajean Lopes, MD   Brief Narrative:  [name] is a 81 y.o. female with medical history significant of remote breast cancer;  HTN; HLD; afib/mechanical valve on Coumadin; Parkinson's; and AVR presenting with pelvic/abdominal pain. Found to have moderate size left rectus sheath hematoma.  Worsening anemia and admitted for close observation and transfusion.  Assessment & Plan:   Principal Problem:   Rectus sheath hematoma, initial encounter Active Problems:   S/P AVR (aortic valve replacement)   HTN (hypertension)   Hypercholesteremia   Chronic anticoagulation   Symptomatic anemia   CKD (chronic kidney disease), stage III (HCC)   Parkinsonism (HCC)   PSP (progressive supranuclear palsy) (HCC)   Paroxysmal atrial fibrillation (HCC)   Rectus sheath hematoma, initial encounter- (present on admission) Symptomatic anemia, POA, improving -Unclear etiology - denies trauma, possibly spontaneous in the setting of anticoagulation -Hemoglobin continues to downtrend -3u PRBC 02/07/22 - repeat hgb currently within normal limits 9.0 -Continue anticoagulation with warfarin due to mechanical valve per pharmacy -INR -remains elevated today -Surgery was consulted -no indication for surgery   Paroxysmal atrial fibrillation ([place])- (present on admission) Mechanical aortic valve -Continue amiodarone (cardiology is trying to wean the dose) -Continue Coumadin -pharmacy following INR; remains elevated today   PSP (progressive supranuclear palsy) ([place])- (present on admission) -With parkinsonism features -Continue Sinemet   AKI on CKD (chronic kidney disease), stage IIIb Hyperkalemia -Improving posttransfusion -Continue to advance diet increase fluid intake   UTI ruled out- (present on admission) - No signs or symptoms of UTI at this point patient denies hesitancy urgency or dysuria    Hypercholesteremia- (present on admission) -Continue Zocor   HTN (hypertension)- (present on admission) -She is not on chronic meds for this and just takes prn lopressor for HR -Will order prn IV hydralazine   DVT prophylaxis: Warfarin Code Status: Full Family Communication: At bedside  Status is: Inpatient  Dispo: The patient is from: Home              Anticipated d/c is to: Home              Anticipated d/c date is: 48 to 72 hours              Patient currently not medically stable for discharge  Consultants:  None  Procedures:  None  Antimicrobials:  None  Subjective: No acute issues or events overnight abdomen/groin pain improving, denies nausea vomiting diarrhea constipation headache fevers chills or chest pain  Objective: Vitals:   02/07/22 2010 02/07/22 2025 02/07/22 2300 02/08/22 0452  BP: 100/61 115/62 137/70 126/68  Pulse: 71 80 78 73  Resp: 17 18 18 18   Temp: 98.7 F (37.1 C) 98.4 F (36.9 C) 98.8 F (37.1 C) 98.7 F (37.1 C)  TempSrc: Oral  Oral Oral  SpO2: 94% 96% 97% 95%  Weight:      Height:        Intake/Output Summary (Last 24 hours) at 02/08/2022 0726 Last data filed at 02/08/2022 0300 Gross per 24 hour  Intake 2500.41 ml  Output --  Net 2500.41 ml    Filed Weights   02/05/22 0617 02/05/22 1942  Weight: 54 kg 57.2 kg    Examination:  General:  Pleasantly resting in bed, No acute distress. HEENT:  Normocephalic atraumatic.  Sclerae nonicteric, noninjected.  Extraocular movements intact bilaterally. Neck:  Without mass or deformity.  Trachea is midline. Lungs:  Clear to auscultate bilaterally without rhonchi, wheeze, or rales. Heart:  Regular rate and rhythm.  Without murmurs, rubs, or gallops. Abdomen:  Soft, nontender, nondistended.  Without guarding or rebound. Extremities: Without cyanosis, clubbing, edema, or obvious deformity. Vascular:  Dorsalis pedis and posterior tibial pulses palpable bilaterally. Skin:  Warm and dry, no  erythema, no ulcerations.    Data Reviewed: I have personally reviewed following labs and imaging studies  CBC: Recent Labs  Lab 02/05/22 0629 02/06/22 0113 02/06/22 1209 02/07/22 0556 02/07/22 1337 02/08/22 0139 02/08/22 0647  WBC 6.5 7.9  --   --   --   --   --   HGB 10.8* 7.5* 7.1* 6.1* 6.5* 9.3* 9.0*  HCT 34.1* 23.0* 22.2* 18.7* 20.4* 27.3* 27.3*  MCV 103.3* 102.2*  --   --   --   --   --   PLT 211 183  --   --   --   --   --     Basic Metabolic Panel: Recent Labs  Lab 02/05/22 0629 02/06/22 0113 02/07/22 0556 02/07/22 1337  NA 136 137 133* 128*  K 4.7 5.1 5.5* 4.9  CL 100 100 97* 93*  CO2 27 27 25 23   GLUCOSE 99 138* 112* 134*  BUN 28* 33* 45* 50*  CREATININE 1.94* 2.44* 4.92* 5.12*  CALCIUM 9.5 8.8* 8.8* 8.2*    GFR: Estimated Creatinine Clearance: 6.6 mL/min (A) (by C-G formula based on SCr of 5.12 mg/dL (H)). Liver Function Tests: Recent Labs  Lab 02/05/22 0629  AST 39  ALT <5  ALKPHOS 79  BILITOT 1.0  PROT 6.0*  ALBUMIN 3.3*    Recent Labs  Lab 02/05/22 0629  LIPASE 27    No results for input(s): AMMONIA in the last 168 hours. Coagulation Profile: Recent Labs  Lab 02/05/22 0629 02/06/22 0113 02/07/22 0556  INR 3.2* 3.3* 4.5*    Cardiac Enzymes: No results for input(s): CKTOTAL, CKMB, CKMBINDEX, TROPONINI in the last 168 hours. BNP (last 3 results) No results for input(s): PROBNP in the last 8760 hours. HbA1C: No results for input(s): HGBA1C in the last 72 hours. CBG: No results for input(s): GLUCAP in the last 168 hours. Lipid Profile: No results for input(s): CHOL, HDL, LDLCALC, TRIG, CHOLHDL, LDLDIRECT in the last 72 hours. Thyroid Function Tests: No results for input(s): TSH, T4TOTAL, FREET4, T3FREE, THYROIDAB in the last 72 hours. Anemia Panel: No results for input(s): VITAMINB12, FOLATE, FERRITIN, TIBC, IRON, RETICCTPCT in the last 72 hours. Sepsis Labs: No results for input(s): PROCALCITON, LATICACIDVEN in the last  168 hours.  Recent Results (from the past 240 hour(s))  Resp Panel by RT-PCR (Flu A&B, Covid) Nasopharyngeal Swab     Status: None   Collection Time: 02/05/22  9:45 AM   Specimen: Nasopharyngeal Swab; Nasopharyngeal(NP) swabs in vial transport medium  Result Value Ref Range Status   SARS Coronavirus 2 by RT PCR NEGATIVE NEGATIVE Final    Comment: (NOTE) SARS-CoV-2 target nucleic acids are NOT DETECTED.  The SARS-CoV-2 RNA is generally detectable in upper respiratory specimens during the acute phase of infection. The lowest concentration of SARS-CoV-2 viral copies this assay can detect is 138 copies/mL. A negative result does not preclude SARS-Cov-2 infection and should not be used as the sole basis for treatment or other patient management decisions. A negative result may occur with  improper specimen collection/handling, submission of specimen other than nasopharyngeal swab, presence of viral mutation(s) within the areas targeted by this assay, and inadequate number  of viral copies(<138 copies/mL). A negative result must be combined with clinical observations, patient history, and epidemiological information. The expected result is Negative.  Fact Sheet for Patients:  EntrepreneurPulse.com.au  Fact Sheet for Healthcare Providers:  IncredibleEmployment.be  This test is no t yet approved or cleared by the [place] FDA and  has been authorized for detection and/or diagnosis of SARS-CoV-2 by FDA under an Emergency Use Authorization (EUA). This EUA will remain  in effect (meaning this test can be used) for the duration of the COVID-19 declaration under Section 564(b)(1) of the Act, 21 U.S.C.section 360bbb-3(b)(1), unless the authorization is terminated  or revoked sooner.       Influenza A by PCR NEGATIVE NEGATIVE Final   Influenza B by PCR NEGATIVE NEGATIVE Final    Comment: (NOTE) The Xpert Xpress SARS-CoV-2/FLU/RSV plus assay is  intended as an aid in the diagnosis of influenza from Nasopharyngeal swab specimens and should not be used as a sole basis for treatment. Nasal washings and aspirates are unacceptable for Xpert Xpress SARS-CoV-2/FLU/RSV testing.  Fact Sheet for Patients: EntrepreneurPulse.com.au  Fact Sheet for Healthcare Providers: IncredibleEmployment.be  This test is not yet approved or cleared by the [place] FDA and has been authorized for detection and/or diagnosis of SARS-CoV-2 by FDA under an Emergency Use Authorization (EUA). This EUA will remain in effect (meaning this test can be used) for the duration of the COVID-19 declaration under Section 564(b)(1) of the Act, 21 U.S.C. section 360bbb-3(b)(1), unless the authorization is terminated or revoked.  Performed at [facility], [place] [address] [place], [place] [address]   Urine Culture     Status: Abnormal   Collection Time: 02/05/22  3:11 PM   Specimen: Urine, Clean Catch  Result Value Ref Range Status   Specimen Description URINE, CLEAN CATCH  Final   Special Requests   Final    NONE Performed at [facility], [place] [address], [place], [place] [address]    Culture MULTIPLE SPECIES PRESENT, SUGGEST RECOLLECTION (A)  Final   Report Status 02/07/2022 FINAL  Final          Radiology Studies: No results found.  Scheduled Meds:  amiodarone  200 mg Oral Once per day on Mon Tue Wed Thu Fri Sat   carbidopa-levodopa  2 tablet Oral TID   docusate sodium  100 mg Oral BID   loratadine  10 mg Oral Daily   melatonin  5 mg Oral QHS   pantoprazole  40 mg Oral Daily   simvastatin  20 mg Oral QPM   Warfarin - Pharmacist Dosing Inpatient   Does not apply q1600   Continuous Infusions:  methocarbamol (ROBAXIN) IV Stopped (02/07/22 1400)    LOS: 1 day   Time spent: 64min  Vollie Aaron C Sheehan Stacey, DO [facility]  If 7PM-7AM, please contact  night-coverage [email or web address]  02/08/2022, 7:26 AM

## 2022-02-08 NOTE — Progress Notes (Signed)
Physical Therapy Treatment Patient Details Name: [name] MRN: [id number] DOB: [birth year] Today's Date: 02/08/2022   History of Present Illness 81 y.o. female with medical history significant of remote breast cancer;  HTN; HLD; afib on Coumadin; Parkinson's; and AVR presenting with chest and abdominal pain.  She reports left sided chest and abdominal pain x 2-3 days.    PT Comments    Continuing work on functional mobility and activity tolerance;  Notable progress with transfers and progressive ambulation; able to walk the hallway with RW and min/mod assist and chair follow for safety; Cues to take slow, smooth, and long steps helped with gait efficiency, as during this admission pt tends to have festinating gait; Performed bed mobility via log roll with overall good success getting up; will need more practice, including with husband providing assist  Recommendations for follow up therapy are one component of a multi-disciplinary discharge planning process, led by the attending physician.  Recommendations may be updated based on patient status, additional functional criteria and insurance authorization.  Follow Up Recommendations  Home health PT     Assistance Recommended at Discharge Frequent or constant Supervision/Assistance  Patient can return home with the following A lot of help with walking and/or transfers;Help with stairs or ramp for entrance   Equipment Recommendations  Other (comment) (Consider shower chair -- will defer to OT)    Recommendations for Other Services       Precautions / Restrictions Precautions Precautions: Fall Precaution Comments: Patient with Progressive Supranuclear Palsy Restrictions Weight Bearing Restrictions: No Other Position/Activity Restrictions: L abdominal/flank pain.     Mobility  Bed Mobility Overal bed mobility: Needs Assistance Bed Mobility: Rolling, Sidelying to Sit Rolling: Min assist Sidelying to sit: Mod assist        General bed mobility comments: Cues for log roll techqniue, including cues and instruction to roll fully onto side ("stack hips and stack shoulders"); Cues also to bring knees to chest in sidelying ("like you are rolling into a ball") to keep pt from extending hips and making coming up harder; Light mod assist to push up to sit    Transfers Overall transfer level: Needs assistance Equipment used: Rolling walker (2 wheels), 2 person hand held assist Transfers: Sit to/from Stand Sit to Stand: Mod assist           General transfer comment: Light mod assist to power up and support; good anterior weight shift and use of UEs to push up from armrests    Ambulation/Gait Ambulation/Gait assistance: Min assist, Mod assist, +2 safety/equipment Gait Distance (Feet): 90 Feet (25+65) Assistive device: Rolling walker (2 wheels) Gait Pattern/deviations: Step-to pattern, Decreased step length - right, Decreased step length - left, Decreased stance time - right, Decreased stance time - left, Festinating, Trunk flexed Gait velocity: slow     General Gait Details: Initially with short, festinating steps; multimodal cues for incr weight shift fully into single limb stance to allow for longer swing and step length; occasional mod asisst for balance; Cues to self-monitor for activity tolerance   Stairs         General stair comments: Patient needs to practice stairs next session.      Wheelchair Mobility    Modified Rankin (Stroke Patients Only)       Balance  Cognition Arousal/Alertness: Awake/alert Behavior During Therapy: WFL for tasks assessed/performed Overall Cognitive Status: Within Functional Limits for tasks assessed                                          Exercises      General Comments General comments (skin integrity, edema, etc.): Husband present and supportive      Pertinent  Vitals/Pain Pain Assessment Pain Assessment: Faces Faces Pain Scale: Hurts little more Pain Location: L side of chest and trunk Pain Descriptors / Indicators: Grimacing Pain Intervention(s): Monitored during session    Home Living                          Prior Function            PT Goals (current goals can now be found in the care plan section) Acute Rehab PT Goals Patient Stated Goal: To have a better, easier method for getting up and OOB PT Goal Formulation: With patient/family Time For Goal Achievement: 02/20/22 Potential to Achieve Goals: Good Progress towards PT goals: Progressing toward goals    Frequency    Min 3X/week      PT Plan Current plan remains appropriate    Co-evaluation              AM-PAC PT "6 Clicks" Mobility   Outcome Measure  Help needed turning from your back to your side while in a flat bed without using bedrails?: A Little Help needed moving from lying on your back to sitting on the side of a flat bed without using bedrails?: A Lot Help needed moving to and from a bed to a chair (including a wheelchair)?: A Lot Help needed standing up from a chair using your arms (e.g., wheelchair or bedside chair)?: A Lot Help needed to walk in hospital room?: A Lot Help needed climbing 3-5 steps with a railing? : A Lot 6 Click Score: 13    End of Session Equipment Utilized During Treatment: Gait belt Activity Tolerance: Patient tolerated treatment well Patient left: in chair;with call bell/phone within reach Nurse Communication: Mobility status PT Visit Diagnosis: Unsteadiness on feet (R26.81);Other abnormalities of gait and mobility (R26.89);Muscle weakness (generalized) (M62.81);Other symptoms and signs involving the nervous system (R29.898)     Time: 4008-6761 PT Time Calculation (min) (ACUTE ONLY): 36 min  Charges:  $Gait Training: 8-22 mins $Therapeutic Activity: 8-22 mins                     Roney Marion, PT  Acute  Rehabilitation Services Pager [phone] Office [place] 02/08/2022, 12:40 PM

## 2022-02-08 NOTE — Progress Notes (Signed)
ANTICOAGULATION CONSULT NOTE - follow-up  Pharmacy Consult for warfarin Indication: MAVR, afib  Allergies  Allergen Reactions   Demerol [Meperidine] Shortness Of Breath and Swelling   Oxycodone Other (See Comments)    Hallucinations    Adhesive [Tape] Other (See Comments)    Redness and skin tears   Ivp Dye [Iodinated Contrast Media] Hives    OK with benadryl pre-med (103m one hour before receiving iodinated contrast agent)   Phenergan [Promethazine] Other (See Comments)    Avoids due to reaction with current medications    Patient Measurements: Height: _0  (149.9 cm) Weight: 57.2 kg (126 lb 1.7 oz) IBW/kg (Calculated) : 43.2   Vital Signs: Temp: 99.2 F (37.3 C) (02/13 0929) Temp Source: Oral (02/13 0929) BP: 111/60 (02/13 0929) Pulse Rate: 80 (02/13 0929)  Labs: Recent Labs    02/06/22 0113 02/06/22 1209 02/07/22 0556 02/07/22 1337 02/08/22 0139 02/08/22 0647  HGB 7.5*   < > 6.1* 6.5* 9.3* 9.0*  HCT 23.0*   < > 18.7* 20.4* 27.3* 27.3*  PLT 183  --   --   --   --   --   LABPROT 33.7*  --  42.9*  --   --  42.6*  INR 3.3*  --  4.5*  --   --  4.5*  CREATININE 2.44*  --  4.92* 5.12*  --  4.30*   < > = values in this interval not displayed.     Estimated Creatinine Clearance: 7.9 mL/min (A) (by C-G formula based on SCr of 4.3 mg/dL (H)).   Medical History: Past Medical History:  Diagnosis Date   BPV (benign positional vertigo)    Chronic neck pain    C2-3 arthropathy   CKD (chronic kidney disease), stage II    Diverticulosis of colon    GERD (gastroesophageal reflux disease)    Hiatal hernia    History of breast cancer per pt no recurrence   dx 2004-- Left breast DCIS (ER/PR+, HER2 negative) s/p  partial masectomy w/ sln bx and  chemoradiotion (completed 2004)   History of herpes zoster    History of kidney stones    HTN (hypertension)    Hyperlipidemia    Irritable bowel syndrome    LBBB (left bundle branch block)    PAF (paroxysmal atrial  fibrillation) (HCC)    a. CHA2DS2VASc = 4-->coumadin.   Parkinsonism ([facility]    neurologist-  dr tat   PONV (postoperative nausea and vomiting)    S/P aortic valve replacement with prosthetic valve    a. 04/13/2006 s/p [employer] mechnical AVR for severe AS;  b. 09/2014 Echo: EF 40-45%, gr1 DD, mild AI/MR, triv TR/PR.   SUI (stress urinary incontinence, female)    Symptomatic anemia    Venous varices      Assessment: [name] presenting with chest pain, has rectus sheath hematoma, she is on warfarin PTA for mechanical AVR, INR on admission 3.2, last dose 2/9.   PTA dosing: 569mMWF, 2.81m84mvery other day  2/10- INR of 3.2- warfarin 2.5 mg po given   2/11- INR 3.3  2/12- INR 4.5, Hemoglobin 6.1  Provider is aware and has ordered PRBC's followed by a repeat H/H. Spoke with LevGrayce Sessionsatient's nurse). He stated that the hematoma is about the same as yesterday.   2/13- INR 4.5, Hemoglobin 9.0 grams/dL after PRBC's received on 02/07/2022  Goal of Therapy:  INR 2.5-3.5  usually, per MD target 2-3 for now with current hematoma Monitor platelets  by anticoagulation protocol: Yes   Plan:  Warfarin 0 mg PO x 1 today Daily INR, s/s bleeding  Samnang Shugars BS, PharmD, BCPS Clinical Pharmacist 02/08/2022 11:41 AM

## 2022-02-08 NOTE — Progress Notes (Signed)
Date and time results received: 02/08/22 0920   Test: INR   Critical Value: 4.5  Name of Provider Notified: Dr. Avon Gully   Orders Received? Or Actions Taken?:  MD aware.

## 2022-02-08 NOTE — Consult Note (Signed)
WOC Nurse Consult Note: Reason for Consult: blisters on sacrum  Wound type: Stage 2 pressure injury Pressure Injury POA: No Measurement:blanchable redness that is 4x6 area with single serous filled blister on the left side of the sacral region  Wound bed: intact serous filled blister Drainage (amount, consistency, odor) none Periwound: intact  Dressing procedure/placement/frequency: Cover blister with single layer of xeroform, top with silicone foam. Change every 3 days and PRN soilage.   Discussed POC with patient and bedside nurse.  Re consult if needed, will not follow at this time. Thanks  Karra Pink [employer], RN,CWOCN, CNS, [place] [phone])

## 2022-02-09 LAB — BASIC METABOLIC PANEL
Anion gap: 9 (ref 5–15)
BUN: 48 mg/dL — ABNORMAL HIGH (ref 8–23)
CO2: 26 mmol/L (ref 22–32)
Calcium: 9 mg/dL (ref 8.9–10.3)
Chloride: 95 mmol/L — ABNORMAL LOW (ref 98–111)
Creatinine, Ser: 3.32 mg/dL — ABNORMAL HIGH (ref 0.44–1.00)
GFR, Estimated: 13 mL/min — ABNORMAL LOW (ref >60)
Glucose, Bld: 94 mg/dL (ref 70–99)
Potassium: 4.3 mmol/L (ref 3.5–5.1)
Sodium: 130 mmol/L — ABNORMAL LOW (ref 135–145)

## 2022-02-09 LAB — HEMOGLOBIN AND HEMATOCRIT, BLOOD
HCT: 29.6 % — ABNORMAL LOW (ref 36.0–46.0)
HCT: 28.5 % — ABNORMAL LOW (ref 36.0–46.0)
Hemoglobin: 10.1 g/dL — ABNORMAL LOW (ref 12.0–15.0)
Hemoglobin: 9.5 g/dL — ABNORMAL LOW (ref 12.0–15.0)

## 2022-02-09 LAB — PROTIME-INR
INR: 1.8 — ABNORMAL HIGH (ref 0.8–1.2)
INR: 1.8 — ABNORMAL HIGH (ref 0.8–1.2)
Prothrombin Time: 20.6 seconds — ABNORMAL HIGH (ref 11.4–15.2)
Prothrombin Time: 21.3 seconds — ABNORMAL HIGH (ref 11.4–15.2)

## 2022-02-09 MED ORDER — ENOXAPARIN SODIUM 60 MG/0.6ML IJ SOSY: 60.0000 mg | PREFILLED_SYRINGE | INTRAMUSCULAR | Status: DC

## 2022-02-09 MED ORDER — WARFARIN SODIUM 5 MG PO TABS
5.0000 mg | ORAL_TABLET | ORAL | Status: AC
  Administered 2022-02-09: 5 mg via ORAL
  Filled 2022-02-09: qty 1

## 2022-02-09 NOTE — Progress Notes (Signed)
PROGRESS NOTE    [name]  VQM:[id number] DOB: 1941/01/26 DOA: 02/05/2022 PCP: Donnajean Lopes, MD   Brief Narrative:  [name] is a 81 y.o. female with medical history significant of remote breast cancer;  HTN; HLD; afib/mechanical valve on Coumadin; Parkinson's; and AVR presenting with pelvic/abdominal pain. Found to have moderate size left rectus sheath hematoma.  Worsening anemia and admitted for close observation and transfusion.  Assessment & Plan:   Principal Problem:   Rectus sheath hematoma, initial encounter Active Problems:   S/P AVR (aortic valve replacement)   HTN (hypertension)   Hypercholesteremia   Chronic anticoagulation   Symptomatic anemia   CKD (chronic kidney disease), stage III (HCC)   Parkinsonism (HCC)   PSP (progressive supranuclear palsy) (HCC)   Paroxysmal atrial fibrillation (HCC)   Pressure injury of skin   Rectus sheath hematoma, initial encounter- (present on admission) Symptomatic anemia, POA, improving -Unclear etiology - denies trauma, possibly spontaneous in the setting of anticoagulation -Hemoglobin somewhat labile over the last 24 hours, if stabilizing tomorrow with improved INR discussed possible discharge home. -3u PRBC 02/07/22 - repeat hgb currently within normal limits  -Last 9.3-->10.1-->9.5 -Continue anticoagulation with warfarin due to mechanical valve per pharmacy -INR - drastically reduced today 1.8(from >4 without reversal) - resume warfarin -Surgery was consulted -no indication for surgery   Paroxysmal atrial fibrillation (HCC)- (present on admission) Mechanical aortic valve -Continue amiodarone (cardiology is trying to wean the dose outpatient) -Continue Coumadin -pharmacy following INR - low today 1.8 (previously >4)   PSP (progressive supranuclear palsy) ([place])- (present on admission) -With parkinsonism features -Continue Sinemet   AKI on CKD (chronic kidney disease), stage  IIIb Hyperkalemia -Improving posttransfusion -Continue to advance diet increase fluid intake   UTI ruled out- (present on admission) - No signs or symptoms of UTI at this point patient denies hesitancy urgency or dysuria   Hypercholesteremia- (present on admission) -Continue Zocor   HTN (hypertension)- (present on admission) -She is not on chronic meds for this and just takes prn lopressor for HR -Will order prn IV hydralazine   DVT prophylaxis: Warfarin Code Status: Full Family Communication: At bedside  Status is: Inpatient  Dispo: The patient is from: Home              Anticipated d/c is to: Home              Anticipated d/c date is: 24-48 hours              Patient currently not medically stable for discharge  Consultants:  None  Procedures:  None  Antimicrobials:  None  Subjective: No acute issues or events overnight abdomen/groin pain improving, denies nausea vomiting diarrhea constipation headache fevers chills or chest pain  Objective: Vitals:   02/08/22 1624 02/08/22 2014 02/09/22 0607 02/09/22 0850  BP: 108/86 (!) 116/57 138/65 (!) 113/53  Pulse: 77 81 70 73  Resp: 16 18 19 20   Temp: 98 F (36.7 C) 99.8 F (37.7 C) 98.3 F (36.8 C) 99.2 F (37.3 C)  TempSrc:  Oral Oral Oral  SpO2: 91% 94% 94% 95%  Weight:      Height:        Intake/Output Summary (Last 24 hours) at 02/09/2022 1617 Last data filed at 02/09/2022 1300 Gross per 24 hour  Intake 950 ml  Output --  Net 950 ml    Filed Weights   02/05/22 0617 02/05/22 1942  Weight: 54 kg 57.2 kg    Examination:  General:  Pleasantly resting in bed, No acute distress. HEENT:  Normocephalic atraumatic.  Sclerae nonicteric, noninjected.  Extraocular movements intact bilaterally. Neck:  Without mass or deformity.  Trachea is midline. Lungs:  Clear to auscultate bilaterally without rhonchi, wheeze, or rales. Heart:  Regular rate and rhythm.  Without murmurs, rubs, or gallops. Abdomen:  Soft,  nontender, nondistended.  Without guarding or rebound. Extremities: Without cyanosis, clubbing, edema, or obvious deformity. Vascular:  Dorsalis pedis and posterior tibial pulses palpable bilaterally. Skin:  Warm and dry, no erythema, no ulcerations.    Data Reviewed: I have personally reviewed following labs and imaging studies  CBC: Recent Labs  Lab 02/05/22 0629 02/06/22 0113 02/06/22 1209 02/08/22 0139 02/08/22 0647 02/08/22 1700 02/09/22 0736 02/09/22 1519  WBC 6.5 7.9  --   --   --   --   --   --   HGB 10.8* 7.5*   < > 9.3* 9.0* 9.3* 10.1* 9.5*  HCT 34.1* 23.0*   < > 27.3* 27.3* 27.6* 29.6* 28.5*  MCV 103.3* 102.2*  --   --   --   --   --   --   PLT 211 183  --   --   --   --   --   --    < > = values in this interval not displayed.    Basic Metabolic Panel: Recent Labs  Lab 02/06/22 0113 02/07/22 0556 02/07/22 1337 02/08/22 0647 02/09/22 0736  NA 137 133* 128* 127* 130*  K 5.1 5.5* 4.9 4.6 4.3  CL 100 97* 93* 93* 95*  CO2 27 25 23 23 26   GLUCOSE 138* 112* 134* 109* 94  BUN 33* 45* 50* 50* 48*  CREATININE 2.44* 4.92* 5.12* 4.30* 3.32*  CALCIUM 8.8* 8.8* 8.2* 8.3* 9.0    GFR: Estimated Creatinine Clearance: 10.2 mL/min (A) (by C-G formula based on SCr of 3.32 mg/dL (H)). Liver Function Tests: Recent Labs  Lab 02/05/22 0629  AST 39  ALT <5  ALKPHOS 79  BILITOT 1.0  PROT 6.0*  ALBUMIN 3.3*    Recent Labs  Lab 02/05/22 0629  LIPASE 27    No results for input(s): AMMONIA in the last 168 hours. Coagulation Profile: Recent Labs  Lab 02/06/22 0113 02/07/22 0556 02/08/22 0647 02/09/22 0736 02/09/22 1002  INR 3.3* 4.5* 4.5* 1.8* 1.8*    Cardiac Enzymes: No results for input(s): CKTOTAL, CKMB, CKMBINDEX, TROPONINI in the last 168 hours. BNP (last 3 results) No results for input(s): PROBNP in the last 8760 hours. HbA1C: No results for input(s): HGBA1C in the last 72 hours. CBG: No results for input(s): GLUCAP in the last 168 hours. Lipid  Profile: No results for input(s): CHOL, HDL, LDLCALC, TRIG, CHOLHDL, LDLDIRECT in the last 72 hours. Thyroid Function Tests: No results for input(s): TSH, T4TOTAL, FREET4, T3FREE, THYROIDAB in the last 72 hours. Anemia Panel: No results for input(s): VITAMINB12, FOLATE, FERRITIN, TIBC, IRON, RETICCTPCT in the last 72 hours. Sepsis Labs: No results for input(s): PROCALCITON, LATICACIDVEN in the last 168 hours.  Recent Results (from the past 240 hour(s))  Resp Panel by RT-PCR (Flu A&B, Covid) Nasopharyngeal Swab     Status: None   Collection Time: 02/05/22  9:45 AM   Specimen: Nasopharyngeal Swab; Nasopharyngeal(NP) swabs in vial transport medium  Result Value Ref Range Status   SARS Coronavirus 2 by RT PCR NEGATIVE NEGATIVE Final    Comment: (NOTE) SARS-CoV-2 target nucleic acids are NOT DETECTED.  The SARS-CoV-2 RNA is generally  detectable in upper respiratory specimens during the acute phase of infection. The lowest concentration of SARS-CoV-2 viral copies this assay can detect is 138 copies/mL. A negative result does not preclude SARS-Cov-2 infection and should not be used as the sole basis for treatment or other patient management decisions. A negative result may occur with  improper specimen collection/handling, submission of specimen other than nasopharyngeal swab, presence of viral mutation(s) within the areas targeted by this assay, and inadequate number of viral copies(<138 copies/mL). A negative result must be combined with clinical observations, patient history, and epidemiological information. The expected result is Negative.  Fact Sheet for Patients:  EntrepreneurPulse.com.au  Fact Sheet for Healthcare Providers:  IncredibleEmployment.be  This test is no t yet approved or cleared by the [place] FDA and  has been authorized for detection and/or diagnosis of SARS-CoV-2 by FDA under an Emergency Use Authorization (EUA). This EUA  will remain  in effect (meaning this test can be used) for the duration of the COVID-19 declaration under Section 564(b)(1) of the Act, 21 U.S.C.section 360bbb-3(b)(1), unless the authorization is terminated  or revoked sooner.       Influenza A by PCR NEGATIVE NEGATIVE Final   Influenza B by PCR NEGATIVE NEGATIVE Final    Comment: (NOTE) The Xpert Xpress SARS-CoV-2/FLU/RSV plus assay is intended as an aid in the diagnosis of influenza from Nasopharyngeal swab specimens and should not be used as a sole basis for treatment. Nasal washings and aspirates are unacceptable for Xpert Xpress SARS-CoV-2/FLU/RSV testing.  Fact Sheet for Patients: EntrepreneurPulse.com.au  Fact Sheet for Healthcare Providers: IncredibleEmployment.be  This test is not yet approved or cleared by the [place] FDA and has been authorized for detection and/or diagnosis of SARS-CoV-2 by FDA under an Emergency Use Authorization (EUA). This EUA will remain in effect (meaning this test can be used) for the duration of the COVID-19 declaration under Section 564(b)(1) of the Act, 21 U.S.C. section 360bbb-3(b)(1), unless the authorization is terminated or revoked.  Performed at [facility], [place] [address], [place], [place] [address]   Urine Culture     Status: Abnormal   Collection Time: 02/05/22  3:11 PM   Specimen: Urine, Clean Catch  Result Value Ref Range Status   Specimen Description URINE, CLEAN CATCH  Final   Special Requests   Final    NONE Performed at [facility], [place] [address], [place],  [address]    Culture MULTIPLE SPECIES PRESENT, SUGGEST RECOLLECTION (A)  Final   Report Status 02/07/2022 FINAL  Final          Radiology Studies: No results found.  Scheduled Meds:  amiodarone  200 mg Oral Once per day on Mon Tue Wed Thu Fri Sat   carbidopa-levodopa  2 tablet Oral TID   docusate sodium  100 mg Oral BID   loratadine  10 mg  Oral Daily   melatonin  5 mg Oral QHS   pantoprazole  40 mg Oral Daily   simvastatin  20 mg Oral QPM   Warfarin - Pharmacist Dosing Inpatient   Does not apply q1600   Continuous Infusions:  methocarbamol (ROBAXIN) IV Stopped (02/07/22 1400)    LOS: 2 days   Time spent: 59min  Zabdiel Dripps C Finnley Larusso, DO [facility]  If 7PM-7AM, please contact night-coverage [email or web address]  02/09/2022, 4:17 PM

## 2022-02-09 NOTE — Progress Notes (Signed)
Physical Therapy Treatment Patient Details Name: [name] MRN: [id number] DOB: 02/15/1941 Today's Date: 02/09/2022   History of Present Illness 81 y.o. female with medical history significant of remote breast cancer;  HTN; HLD; afib on Coumadin; Parkinson's; and AVR presenting with chest and abdominal pain.  She reports left sided chest and abdominal pain x 2-3 days.    PT Comments    Continuing work on functional mobility and activity tolerance;  session focused on bed mobility, specifically sidelie <> sit to address one of pt and husband's biggenst mobiltiy concerns; Cues for technique, and pt progressed to needing minguard assist to push up to sit; Husband is hopeful that pt will be able to apply this techqniue to getting OOB at home; rec HHPT follow up   Recommendations for follow up therapy are one component of a multi-disciplinary discharge planning process, led by the attending physician.  Recommendations may be updated based on patient status, additional functional criteria and insurance authorization.  Follow Up Recommendations  Home health PT     Assistance Recommended at Discharge Frequent or constant Supervision/Assistance  Patient can return home with the following A lot of help with walking and/or transfers;Help with stairs or ramp for entrance   Equipment Recommendations  Other (comment) (Consider shower chair -- will defer to OT)    Recommendations for Other Services       Precautions / Restrictions Precautions Precautions: Fall Precaution Comments: Patient with Progressive Supranuclear Palsy Restrictions Other Position/Activity Restrictions: L abdominal/flank pain.     Mobility  Bed Mobility Overal bed mobility: Needs Assistance Bed Mobility: Rolling, Sidelying to Sit, Sit to Sidelying Rolling: Min assist, Min guard Sidelying to sit: Min assist, Min guard     Sit to sidelying: Min assist, Min guard General bed mobility comments: Perfomed  sidelie to sit, then sit to sidelie repetitions with multimodal cues for technqiue; progressed to not needing physical assist    Transfers Overall transfer level: Needs assistance Equipment used: Rolling walker (2 wheels), 2 person hand held assist Transfers: Sit to/from Stand, Bed to chair/wheelchair/BSC Sit to Stand: Min assist   Step pivot transfers: Min assist       General transfer comment: Min assist to steady RW, and cues for anterior weight shift; Good rise; used RW for step pivot to to recliner    Ambulation/Gait                   Stairs             Wheelchair Mobility    Modified Rankin (Stroke Patients Only)       Balance     Sitting balance-Leahy Scale: Fair       Standing balance-Leahy Scale: Poor                              Cognition Arousal/Alertness: Awake/alert Behavior During Therapy: WFL for tasks assessed/performed Overall Cognitive Status: Within Functional Limits for tasks assessed                                          Exercises      General Comments General comments (skin integrity, edema, etc.): Husband present and helpful; Questions solicited and answered       Pertinent Vitals/Pain Pain Assessment Pain Assessment: Faces Faces Pain Scale: Hurts a little bit Pain Location:  L side of chest and trunk Pain Descriptors / Indicators: Grimacing, Guarding Pain Intervention(s): Monitored during session    Home Living                          Prior Function            PT Goals (current goals can now be found in the care plan section) Acute Rehab PT Goals Patient Stated Goal: To have a better, easier method for getting up and OOB PT Goal Formulation: With patient/family Time For Goal Achievement: 02/20/22 Potential to Achieve Goals: Good Progress towards PT goals: Progressing toward goals    Frequency    Min 3X/week      PT Plan Current plan remains appropriate     Co-evaluation              AM-PAC PT "6 Clicks" Mobility   Outcome Measure  Help needed turning from your back to your side while in a flat bed without using bedrails?: A Little Help needed moving from lying on your back to sitting on the side of a flat bed without using bedrails?: A Little Help needed moving to and from a bed to a chair (including a wheelchair)?: A Little Help needed standing up from a chair using your arms (e.g., wheelchair or bedside chair)?: A Little Help needed to walk in hospital room?: A Lot Help needed climbing 3-5 steps with a railing? : A Lot 6 Click Score: 16    End of Session Equipment Utilized During Treatment: Gait belt Activity Tolerance: Patient tolerated treatment well Patient left: in chair;with call bell/phone within reach Nurse Communication: Mobility status PT Visit Diagnosis: Unsteadiness on feet (R26.81);Other abnormalities of gait and mobility (R26.89);Muscle weakness (generalized) (M62.81);Other symptoms and signs involving the nervous system (R29.898)     Time: 8891-6945 PT Time Calculation (min) (ACUTE ONLY): 30 min  Charges:  $Therapeutic Activity: 23-37 mins                     Roney Marion, [place]  Acute Rehabilitation Services Pager [phone] Office [place] 02/09/2022, 6:16 PM

## 2022-02-09 NOTE — Progress Notes (Signed)
ANTICOAGULATION CONSULT NOTE - follow-up  Pharmacy Consult for warfarin Indication: MAVR, afib  Allergies  Allergen Reactions   Demerol [Meperidine] Shortness Of Breath and Swelling   Oxycodone Other (See Comments)    Hallucinations    Adhesive [Tape] Other (See Comments)    Redness and skin tears   Ivp Dye [Iodinated Contrast Media] Hives    OK with benadryl pre-med (2m one hour before receiving iodinated contrast agent)   Phenergan [Promethazine] Other (See Comments)    Avoids due to reaction with current medications    Patient Measurements: Height: 4' 11" (149.9 cm) Weight: 57.2 kg (126 lb 1.7 oz) IBW/kg (Calculated) : 43.2   Vital Signs: Temp: 99.2 F (37.3 C) (02/14 0850) Temp Source: Oral (02/14 0850) BP: 113/53 (02/14 0850) Pulse Rate: 73 (02/14 0850)  Labs: Recent Labs    02/07/22 0556 02/07/22 1337 02/08/22 0139 02/08/22 0647 02/08/22 1700 02/09/22 0736  HGB 6.1* 6.5*   < > 9.0* 9.3* 10.1*  HCT 18.7* 20.4*   < > 27.3* 27.6* 29.6*  LABPROT 42.9*  --   --  42.6*  --  20.6*  INR 4.5*  --   --  4.5*  --  1.8*  CREATININE 4.92* 5.12*  --  4.30*  --  3.32*   < > = values in this interval not displayed.     Estimated Creatinine Clearance: 10.2 mL/min (A) (by C-G formula based on SCr of 3.32 mg/dL (H)).   Medical History: Past Medical History:  Diagnosis Date   BPV (benign positional vertigo)    Chronic neck pain    C2-3 arthropathy   CKD (chronic kidney disease), stage II    Diverticulosis of colon    GERD (gastroesophageal reflux disease)    Hiatal hernia    History of breast cancer per pt no recurrence   dx 2004-- Left breast DCIS (ER/PR+, HER2 negative) s/p  partial masectomy w/ sln bx and  chemoradiotion (completed 2004)   History of herpes zoster    History of kidney stones    HTN (hypertension)    Hyperlipidemia    Irritable bowel syndrome    LBBB (left bundle branch block)    PAF (paroxysmal atrial fibrillation) (HCC)    a. CHA2DS2VASc  = 4-->coumadin.   Parkinsonism ([facility]    neurologist-  dr tat   PONV (postoperative nausea and vomiting)    S/P aortic valve replacement with prosthetic valve    a. 04/13/2006 s/p [employer] mechnical AVR for severe AS;  b. 09/2014 Echo: EF 40-45%, gr1 DD, mild AI/MR, triv TR/PR.   SUI (stress urinary incontinence, female)    Symptomatic anemia    Venous varices      Assessment: 89YOF presenting with chest pain, has rectus sheath hematoma, she is on warfarin PTA for mechanical AVR from 2007, INR on admission 3.2. Hgb down to 6.1 on 2/12 and received PRBCs x 3. Hgb stable at 10.1 today. PO intake today is documented at 75%. Last dose of warfarin given was 2.5 mg on 2/10. We have been using a reduced goal INR of 2-3, while inpatient, due to hematoma. PCP or cardiologist will need to reassess if need/indication exists for increased INR goal.  INR drop from 4.5 to 1.8 today (verified with repeat lab)  PTA dosing: 520mMWF, 2.55m31mll other days   Goal of Therapy:  INR 2-3 (Previously documented as INR 2.5-3.5) Monitor platelets by anticoagulation protocol: Yes   Plan:  Give warfarin 5 mg po today  If discharged, plan to repeat 5 mg dose Wednesday, with INR check/follow up Thursday. Monitor daily INR, CBC, clinical course, s/sx of bleed, PO intake/diet, Drug-Drug Interactions    Thank you for allowing [place] to participate in this patients care. Jens Som, PharmD 02/09/2022 9:26 AM  **Pharmacist phone directory can be found on [email or web address] listed under [place]**

## 2022-02-10 LAB — BASIC METABOLIC PANEL
Anion gap: 8 (ref 5–15)
BUN: 51 mg/dL — ABNORMAL HIGH (ref 8–23)
CO2: 26 mmol/L (ref 22–32)
Calcium: 8.4 mg/dL — ABNORMAL LOW (ref 8.9–10.3)
Chloride: 98 mmol/L (ref 98–111)
Creatinine, Ser: 2.56 mg/dL — ABNORMAL HIGH (ref 0.44–1.00)
GFR, Estimated: 18 mL/min — ABNORMAL LOW (ref >60)
Glucose, Bld: 97 mg/dL (ref 70–99)
Potassium: 4.4 mmol/L (ref 3.5–5.1)
Sodium: 132 mmol/L — ABNORMAL LOW (ref 135–145)

## 2022-02-10 LAB — PROTIME-INR
INR: 1.6 — ABNORMAL HIGH (ref 0.8–1.2)
Prothrombin Time: 18.8 seconds — ABNORMAL HIGH (ref 11.4–15.2)

## 2022-02-10 LAB — CBC
HCT: 27.2 % — ABNORMAL LOW (ref 36.0–46.0)
Hemoglobin: 8.9 g/dL — ABNORMAL LOW (ref 12.0–15.0)
MCH: 29.8 pg (ref 26.0–34.0)
MCHC: 32.7 g/dL (ref 30.0–36.0)
MCV: 91 fL (ref 80.0–100.0)
Platelets: 142 10*3/uL — ABNORMAL LOW (ref 150–400)
RBC: 2.99 MIL/uL — ABNORMAL LOW (ref 3.87–5.11)
RDW: 21.1 % — ABNORMAL HIGH (ref 11.5–15.5)
WBC: 7.5 10*3/uL (ref 4.0–10.5)
nRBC: 0 % (ref 0.0–0.2)

## 2022-02-10 MED ORDER — WARFARIN SODIUM 5 MG PO TABS
5.0000 mg | ORAL_TABLET | ORAL | Status: AC
  Administered 2022-02-10: 5 mg via ORAL
  Filled 2022-02-10: qty 1

## 2022-02-10 MED ORDER — POLYETHYLENE GLYCOL 3350 17 G PO PACK
17.0000 g | PACK | Freq: Two times a day (BID) | ORAL | Status: DC
  Administered 2022-02-10 – 2022-02-13 (×3): 17 g via ORAL
  Filled 2022-02-10 (×7): qty 1

## 2022-02-10 MED ORDER — SENNOSIDES-DOCUSATE SODIUM 8.6-50 MG PO TABS
1.0000 | ORAL_TABLET | Freq: Every day | ORAL | Status: DC
  Administered 2022-02-10 – 2022-02-13 (×2): 1 via ORAL
  Filled 2022-02-10 (×4): qty 1

## 2022-02-10 MED ORDER — SODIUM CHLORIDE 0.9 % IV SOLN: INTRAVENOUS | Status: DC

## 2022-02-10 NOTE — Progress Notes (Signed)
Occupational Therapy Treatment Patient Details Name: [name] MRN: [id number] DOB: Nov 19, 1941 Today's Date: 02/10/2022   History of present illness 81 y.o. female with medical history significant of remote breast cancer;  HTN; HLD; afib on Coumadin; Parkinson's; and AVR presenting with chest and abdominal pain.  She reports left sided chest and abdominal pain x 2-3 days.   OT comments  Pt making progress with functional goals. Session focused on log roll technique to sit EOB with min A. Sit - stand to RW min A, functional mobility with RW to bathroom for toilet transfers, toileting tasks; walked to sink to wash and dry hand and then transferred to chair. Pt's husband present and very supportive. OT will continue to follow to maximize level of function and safety   Recommendations for follow up therapy are one component of a multi-disciplinary discharge planning process, led by the attending physician.  Recommendations may be updated based on patient status, additional functional criteria and insurance authorization.    Follow Up Recommendations  Home health OT    Assistance Recommended at Discharge Intermittent Supervision/Assistance  Patient can return home with the following  A lot of help with bathing/dressing/bathroom;A little help with walking and/or transfers;Direct supervision/assist for medications management;Assist for transportation;Help with stairs or ramp for entrance   Equipment Recommendations  None recommended by OT    Recommendations for Other Services      Precautions / Restrictions Precautions Precautions: Fall Precaution Comments: Patient with Progressive Supranuclear Palsy Restrictions Weight Bearing Restrictions: No       Mobility Bed Mobility Overal bed mobility: Needs Assistance Bed Mobility: Rolling, Sidelying to Sit Rolling: Min assist, Min guard Sidelying to sit: Min assist, Min guard       General bed mobility comments: log roll  technique with min a with LEs off EOB    Transfers Overall transfer level: Needs assistance Equipment used: Rolling walker (2 wheels) Transfers: Sit to/from Stand, Bed to chair/wheelchair/BSC Sit to Stand: Min assist Stand pivot transfers: Min assist   Step pivot transfers: Min assist           Balance Overall balance assessment: Needs assistance Sitting-balance support: No upper extremity supported, Feet supported Sitting balance-Leahy Scale: Fair     Standing balance support: Bilateral upper extremity supported, During functional activity Standing balance-Leahy Scale: Poor                             ADL either performed or assessed with clinical judgement   ADL                                              Extremity/Trunk Assessment Upper Extremity Assessment Upper Extremity Assessment: Generalized weakness   Lower Extremity Assessment Lower Extremity Assessment: Defer to PT evaluation   Cervical / Trunk Assessment Cervical / Trunk Assessment: Kyphotic    Vision Baseline Vision/History: 1 Wears glasses Ability to See in Adequate Light: 0 Adequate Patient Visual Report: No change from baseline     Perception     Praxis      Cognition Arousal/Alertness: Awake/alert Behavior During Therapy: WFL for tasks assessed/performed Overall Cognitive Status: Within Functional Limits for tasks assessed  Exercises      Shoulder Instructions       General Comments      Pertinent Vitals/ Pain       Pain Assessment Pain Assessment: No/denies pain Pain Score: 0-No pain Pain Intervention(s): Monitored during session, Repositioned  Home Living                                          Prior Functioning/Environment              Frequency  Min 2X/week        Progress Toward Goals  OT Goals(current goals can now be found in the care plan  section)  Progress towards OT goals: Progressing toward goals     Plan Discharge plan remains appropriate;Frequency remains appropriate    Co-evaluation                 AM-PAC OT "6 Clicks" Daily Activity     Outcome Measure   Help from another person eating meals?: None Help from another person taking care of personal grooming?: A Little Help from another person toileting, which includes using toliet, bedpan, or urinal?: A Little Help from another person bathing (including washing, rinsing, drying)?: A Lot Help from another person to put on and taking off regular upper body clothing?: A Little Help from another person to put on and taking off regular lower body clothing?: A Lot 6 Click Score: 17    End of Session Equipment Utilized During Treatment: Gait belt;Rolling walker (2 wheels)  OT Visit Diagnosis: Unsteadiness on feet (R26.81);Muscle weakness (generalized) (M62.81);Pain   Activity Tolerance Patient limited by fatigue   Patient Left with call bell/phone within reach;with family/visitor present;in chair;with chair alarm set   Nurse Communication Mobility status        Time: 5397-6734 OT Time Calculation (min): 25 min  Charges: OT General Charges $OT Visit: 1 Visit OT Treatments $Self Care/Home Management : 8-22 mins $Neuromuscular Re-education: 8-22 mins   Britt Bottom 02/10/2022, 2:30 PM

## 2022-02-10 NOTE — Care Management Important Message (Signed)
Important Message  Patient Details  Name: [name] MRN: [id number] Date of Birth: [birth year]   Medicare Important Message Given:  Yes     Orbie Pyo 02/10/2022, 1:39 PM

## 2022-02-10 NOTE — Progress Notes (Signed)
PROGRESS NOTE    [name]  OZD:[id number] DOB: December 15, 1941 DOA: 02/05/2022 PCP: Donnajean Lopes, MD   Brief Narrative:  [name] is a 81 y.o. female with medical history significant of remote breast cancer;  HTN; HLD; afib/mechanical valve on Coumadin; Parkinson's; and AVR presenting with pelvic/abdominal pain. Found to have moderate size left rectus sheath hematoma.  Worsening anemia and admitted for close observation and transfusion.  2/15 INR 1.6 today.  Family at bedside.  Patient complains when she stands she becomes mildly lightheaded. Having small amounts of hard stool per husband's    Assessment & Plan:   Principal Problem:   Rectus sheath hematoma, initial encounter Active Problems:   S/P AVR (aortic valve replacement)   HTN (hypertension)   Hypercholesteremia   Chronic anticoagulation   Symptomatic anemia   CKD (chronic kidney disease), stage III (HCC)   Parkinsonism (HCC)   PSP (progressive supranuclear palsy) (HCC)   Paroxysmal atrial fibrillation (HCC)   Pressure injury of skin   Rectus sheath hematoma, initial encounter- (present on admission) Symptomatic anemia, POA, improving -Unclear etiology - denies trauma, possibly spontaneous in the setting of anticoagulation -Hemoglobin somewhat labile over the last 24 hours, if stabilizing tomorrow with improved INR discussed possible discharge home. -3u PRBC 02/07/22 - repeat hgb currently within normal limits  2/15 H&H stable, INR 1.6 Coumadin 5 mg will be given today we will monitor H&H closely Check orthostasis   Paroxysmal atrial fibrillation (HCC)- (present on admission) Mechanical aortic valve Continue amiodarone INR subtherapeutic dosing per pharmacy for Coumadin   PSP (progressive supranuclear palsy) ([place])- (present on admission) -With parkinsonism features 2/15 continue Sinemet   AKI on CKD (chronic kidney disease), stage IIIb Hyperkalemia Due to prerenal Improving after  transfusion We will start IV fluids for gentle hydration monitor creatinine  Constipation Start bowel regimen   UTI ruled out- (present on admission) - No signs or symptoms of UTI at this point patient denies hesitancy urgency or dysuria   Hypercholesteremia- (present on admission) -Continue Zocor   HTN (hypertension)- (present on admission) -She is not on chronic meds for this and just takes prn lopressor for HR Continue IV hydralazine as needed   DVT prophylaxis: Warfarin Code Status: Full Family Communication: At bedside  Status is: Inpatient, patient remains inpatient due to requirements of IV treatment, INR subtherapeutic, symptomatic  Dispo: The patient is from: Home              Anticipated d/c is to: Home              Anticipated d/c date is: 24-48 hours              Patient currently not medically stable for discharge.  Consultants:  None  Procedures:  None  Antimicrobials:  None  Subjective: No shortness of breath, chest pain, abdominal pain  Objective: Vitals:   02/09/22 1632 02/09/22 1945 02/10/22 0637 02/10/22 0919  BP: 118/70 129/67 132/70 110/60  Pulse: (!) 58 67 62 69  Resp: 18  20 17   Temp: 98 F (36.7 C) 98.3 F (36.8 C) 97.8 F (36.6 C) 98.1 F (36.7 C)  TempSrc:  Oral    SpO2: 95% 96%  98%  Weight:      Height:        Intake/Output Summary (Last 24 hours) at 02/10/2022 1244 Last data filed at 02/10/2022 0800 Gross per 24 hour  Intake 900 ml  Output --  Net 900 ml   [employer]  02/05/22 0617 02/05/22 1942  Weight: 54 kg 57.2 kg    Examination:  Calm, NAD Cta no w/r Reg s1/s2 no gallop, crisp sounds Soft benign +bs Ecchymosis of abd /lt thigh No edema Aaoxox3  Mood and affect appropriate in current setting        Data Reviewed: I have personally reviewed following labs and imaging studies  CBC: Recent Labs  Lab 02/05/22 0629 02/06/22 0113 02/06/22 1209 02/08/22 0647 02/08/22 1700 02/09/22 0736  02/09/22 1519 02/10/22 0425  WBC 6.5 7.9  --   --   --   --   --  7.5  HGB 10.8* 7.5*   < > 9.0* 9.3* 10.1* 9.5* 8.9*  HCT 34.1* 23.0*   < > 27.3* 27.6* 29.6* 28.5* 27.2*  MCV 103.3* 102.2*  --   --   --   --   --  91.0  PLT 211 183  --   --   --   --   --  142*   < > = values in this interval not displayed.   Basic Metabolic Panel: Recent Labs  Lab 02/07/22 0556 02/07/22 1337 02/08/22 0647 02/09/22 0736 02/10/22 0425  NA 133* 128* 127* 130* 132*  K 5.5* 4.9 4.6 4.3 4.4  CL 97* 93* 93* 95* 98  CO2 25 23 23 26 26   GLUCOSE 112* 134* 109* 94 97  BUN 45* 50* 50* 48* 51*  CREATININE 4.92* 5.12* 4.30* 3.32* 2.56*  CALCIUM 8.8* 8.2* 8.3* 9.0 8.4*   GFR: Estimated Creatinine Clearance: 13.3 mL/min (A) (by C-G formula based on SCr of 2.56 mg/dL (H)). Liver Function Tests: Recent Labs  Lab 02/05/22 0629  AST 39  ALT <5  ALKPHOS 79  BILITOT 1.0  PROT 6.0*  ALBUMIN 3.3*   Recent Labs  Lab 02/05/22 0629  LIPASE 27   No results for input(s): AMMONIA in the last 168 hours. Coagulation Profile: Recent Labs  Lab 02/07/22 0556 02/08/22 0647 02/09/22 0736 02/09/22 1002 02/10/22 0425  INR 4.5* 4.5* 1.8* 1.8* 1.6*   Cardiac Enzymes: No results for input(s): CKTOTAL, CKMB, CKMBINDEX, TROPONINI in the last 168 hours. BNP (last 3 results) No results for input(s): PROBNP in the last 8760 hours. HbA1C: No results for input(s): HGBA1C in the last 72 hours. CBG: No results for input(s): GLUCAP in the last 168 hours. Lipid Profile: No results for input(s): CHOL, HDL, LDLCALC, TRIG, CHOLHDL, LDLDIRECT in the last 72 hours. Thyroid Function Tests: No results for input(s): TSH, T4TOTAL, FREET4, T3FREE, THYROIDAB in the last 72 hours. Anemia Panel: No results for input(s): VITAMINB12, FOLATE, FERRITIN, TIBC, IRON, RETICCTPCT in the last 72 hours. Sepsis Labs: No results for input(s): PROCALCITON, LATICACIDVEN in the last 168 hours.  Recent Results (from the past 240 hour(s))   Resp Panel by RT-PCR (Flu A&B, Covid) Nasopharyngeal Swab     Status: None   Collection Time: 02/05/22  9:45 AM   Specimen: Nasopharyngeal Swab; Nasopharyngeal(NP) swabs in vial transport medium  Result Value Ref Range Status   SARS Coronavirus 2 by RT PCR NEGATIVE NEGATIVE Final    Comment: (NOTE) SARS-CoV-2 target nucleic acids are NOT DETECTED.  The SARS-CoV-2 RNA is generally detectable in upper respiratory specimens during the acute phase of infection. The lowest concentration of SARS-CoV-2 viral copies this assay can detect is 138 copies/mL. A negative result does not preclude SARS-Cov-2 infection and should not be used as the sole basis for treatment or other patient management decisions. A negative result may occur  with  improper specimen collection/handling, submission of specimen other than nasopharyngeal swab, presence of viral mutation(s) within the areas targeted by this assay, and inadequate number of viral copies(<138 copies/mL). A negative result must be combined with clinical observations, patient history, and epidemiological information. The expected result is Negative.  Fact Sheet for Patients:  EntrepreneurPulse.com.au  Fact Sheet for Healthcare Providers:  IncredibleEmployment.be  This test is no t yet approved or cleared by the [place] FDA and  has been authorized for detection and/or diagnosis of SARS-CoV-2 by FDA under an Emergency Use Authorization (EUA). This EUA will remain  in effect (meaning this test can be used) for the duration of the COVID-19 declaration under Section 564(b)(1) of the Act, 21 U.S.C.section 360bbb-3(b)(1), unless the authorization is terminated  or revoked sooner.       Influenza A by PCR NEGATIVE NEGATIVE Final   Influenza B by PCR NEGATIVE NEGATIVE Final    Comment: (NOTE) The Xpert Xpress SARS-CoV-2/FLU/RSV plus assay is intended as an aid in the diagnosis of influenza from  Nasopharyngeal swab specimens and should not be used as a sole basis for treatment. Nasal washings and aspirates are unacceptable for Xpert Xpress SARS-CoV-2/FLU/RSV testing.  Fact Sheet for Patients: EntrepreneurPulse.com.au  Fact Sheet for Healthcare Providers: IncredibleEmployment.be  This test is not yet approved or cleared by the [place] FDA and has been authorized for detection and/or diagnosis of SARS-CoV-2 by FDA under an Emergency Use Authorization (EUA). This EUA will remain in effect (meaning this test can be used) for the duration of the COVID-19 declaration under Section 564(b)(1) of the Act, 21 U.S.C. section 360bbb-3(b)(1), unless the authorization is terminated or revoked.  Performed at [facility], [place] [address], [place], [place] [address]   Urine Culture     Status: Abnormal   Collection Time: 02/05/22  3:11 PM   Specimen: Urine, Clean Catch  Result Value Ref Range Status   Specimen Description URINE, CLEAN CATCH  Final   Special Requests   Final    NONE Performed at [facility], [place] [address], [place], [place] [address]    Culture MULTIPLE SPECIES PRESENT, SUGGEST RECOLLECTION (A)  Final   Report Status 02/07/2022 FINAL  Final          Radiology Studies: No results found.  Scheduled Meds:  amiodarone  200 mg Oral Once per day on Mon Tue Wed Thu Fri Sat   carbidopa-levodopa  2 tablet Oral TID   docusate sodium  100 mg Oral BID   loratadine  10 mg Oral Daily   melatonin  5 mg Oral QHS   pantoprazole  40 mg Oral Daily   simvastatin  20 mg Oral QPM   warfarin  5 mg Oral NOW   Warfarin - Pharmacist Dosing Inpatient   Does not apply q1600   Continuous Infusions:  methocarbamol (ROBAXIN) IV Stopped (02/07/22 1400)    LOS: 3 days   Time spent: 11min  Nolberto Hanlon, MD [facility]  If 7PM-7AM, please contact night-coverage [email or web address]  02/10/2022, 12:44 PM

## 2022-02-10 NOTE — Plan of Care (Signed)
  Problem: Nutrition: Goal: Adequate nutrition will be maintained Outcome: Adequate for Discharge   

## 2022-02-10 NOTE — Progress Notes (Addendum)
ANTICOAGULATION CONSULT NOTE - follow-up  Pharmacy Consult for warfarin Indication: MAVR, afib  Allergies  Allergen Reactions   Demerol [Meperidine] Shortness Of Breath and Swelling   Oxycodone Other (See Comments)    Hallucinations    Adhesive [Tape] Other (See Comments)    Redness and skin tears   Ivp Dye [Iodinated Contrast Media] Hives    OK with benadryl pre-med (72m one hour before receiving iodinated contrast agent)   Phenergan [Promethazine] Other (See Comments)    Avoids due to reaction with current medications    Patient Measurements: Height: 4' 11"  (149.9 cm) Weight: 57.2 kg (126 lb 1.7 oz) IBW/kg (Calculated) : 43.2   Vital Signs: Temp: 97.8 F (36.6 C) (02/15 0637) BP: 132/70 (02/15 0637) Pulse Rate: 62 (02/15 0637)  Labs: Recent Labs    02/08/22 0647 02/08/22 1700 02/09/22 0736 02/09/22 1002 02/09/22 1519 02/10/22 0425  HGB 9.0*   < > 10.1*  --  9.5* 8.9*  HCT 27.3*   < > 29.6*  --  28.5* 27.2*  PLT  --   --   --   --   --  142*  LABPROT 42.6*  --  20.6* 21.3*  --  18.8*  INR 4.5*  --  1.8* 1.8*  --  1.6*  CREATININE 4.30*  --  3.32*  --   --  2.56*   < > = values in this interval not displayed.     Estimated Creatinine Clearance: 13.3 mL/min (A) (by C-G formula based on SCr of 2.56 mg/dL (H)).   Medical History: Past Medical History:  Diagnosis Date   BPV (benign positional vertigo)    Chronic neck pain    C2-3 arthropathy   CKD (chronic kidney disease), stage II    Diverticulosis of colon    GERD (gastroesophageal reflux disease)    Hiatal hernia    History of breast cancer per pt no recurrence   dx 2004-- Left breast DCIS (ER/PR+, HER2 negative) s/p  partial masectomy w/ sln bx and  chemoradiotion (completed 2004)   History of herpes zoster    History of kidney stones    HTN (hypertension)    Hyperlipidemia    Irritable bowel syndrome    LBBB (left bundle branch block)    PAF (paroxysmal atrial fibrillation) (HCC)    a.  CHA2DS2VASc = 4-->coumadin.   Parkinsonism ([facility]    neurologist-  dr tat   PONV (postoperative nausea and vomiting)    S/P aortic valve replacement with prosthetic valve    a. 04/13/2006 s/p [employer] mechnical AVR for severe AS;  b. 09/2014 Echo: EF 40-45%, gr1 DD, mild AI/MR, triv TR/PR.   SUI (stress urinary incontinence, female)    Symptomatic anemia    Venous varices      Assessment: 89YOF presenting with chest pain, has rectus sheath hematoma, she is on warfarin PTA for mechanical AVR from 2007, INR on admission 3.2. Hgb down to 6.1 on 2/12 and received PRBCs x 3.   We have been using a reduced goal INR of 2-3, while inpatient, due to hematoma. PCP or cardiologist will need to reassess if need/indication exists for increased INR goal.  INR down to 1.6 today, Hgb is down slightly today to 8.9. Spoke with MD, okay without bridge for two days with AVR. If INR is not up 2/16, plan to add a bridge. PO intake today is documented at 100%.   PTA dosing: 598mMWF, 2.74m674mll other days  Goal of  Therapy:  INR 2-3 (Previously documented as INR 2.5-3.5) Monitor platelets by anticoagulation protocol: Yes   Plan:  Give warfarin 5 mg po x 1 today.  Monitor daily INR, CBC, clinical course, s/sx of bleed, PO intake/diet, Drug-Drug Interactions   Thank you for allowing [place] to participate in this patients care. Jens Som, PharmD 02/10/2022 8:39 AM  **Pharmacist phone directory can be found on [email or web address] listed under [place]**

## 2022-02-11 LAB — PROTIME-INR
INR: 1.6 — ABNORMAL HIGH (ref 0.8–1.2)
Prothrombin Time: 19.3 seconds — ABNORMAL HIGH (ref 11.4–15.2)

## 2022-02-11 LAB — CREATININE, SERUM
Creatinine, Ser: 2.2 mg/dL — ABNORMAL HIGH (ref 0.44–1.00)
GFR, Estimated: 22 mL/min — ABNORMAL LOW (ref >60)

## 2022-02-11 MED ORDER — GUAIFENESIN 100 MG/5ML PO LIQD
5.0000 mL | ORAL | Status: DC | PRN
  Administered 2022-02-11 – 2022-02-12 (×4): 5 mL via ORAL
  Filled 2022-02-11 (×4): qty 5

## 2022-02-11 MED ORDER — WARFARIN SODIUM 6 MG PO TABS
6.0000 mg | ORAL_TABLET | Freq: Once | ORAL | Status: AC
  Administered 2022-02-11: 6 mg via ORAL
  Filled 2022-02-11: qty 1

## 2022-02-11 MED ORDER — WARFARIN SODIUM 5 MG PO TABS: 5.0000 mg | ORAL_TABLET | Freq: Once | ORAL | Status: DC

## 2022-02-11 MED ORDER — MENTHOL 3 MG MT LOZG
1.0000 | LOZENGE | OROMUCOSAL | Status: DC | PRN
  Administered 2022-02-11: 3 mg via ORAL
  Filled 2022-02-11: qty 9

## 2022-02-11 NOTE — Plan of Care (Signed)
  Problem: Health Behavior/Discharge Planning: Goal: Ability to manage health-related needs will improve Outcome: Progressing   Problem: Activity: Goal: Risk for activity intolerance will decrease Outcome: Progressing   Problem: Nutrition: Goal: Adequate nutrition will be maintained Outcome: Progressing   Problem: Coping: Goal: Level of anxiety will decrease Outcome: Progressing   

## 2022-02-11 NOTE — Progress Notes (Signed)
PROGRESS NOTE    [name]  KDX:[id number] DOB: 11-02-1941 DOA: 02/05/2022 PCP: Donnajean Lopes, MD   Brief Narrative:  [name] is a 81 y.o. female with medical history significant of remote breast cancer;  HTN; HLD; afib/mechanical valve on Coumadin; Parkinson's; and AVR presenting with pelvic/abdominal pain. Found to have moderate size left rectus sheath hematoma.  Worsening anemia and admitted for close observation and transfusion.  2/15 INR 1.6 today.  Family at bedside.  Patient complains when she stands she becomes mildly lightheaded. Having small amounts of hard stool per husband's  2/16 INR 1.6 today  Assessment & Plan:   Principal Problem:   Rectus sheath hematoma, initial encounter Active Problems:   S/P AVR (aortic valve replacement)   HTN (hypertension)   Hypercholesteremia   Chronic anticoagulation   Symptomatic anemia   CKD (chronic kidney disease), stage III (HCC)   Parkinsonism (HCC)   PSP (progressive supranuclear palsy) (HCC)   Paroxysmal atrial fibrillation (HCC)   Pressure injury of skin   Rectus sheath hematoma, initial encounter- (present on admission) Symptomatic anemia, POA, improving -Unclear etiology - denies trauma, possibly spontaneous in the setting of anticoagulation -Hemoglobin somewhat labile over the last 24 hours, if stabilizing tomorrow with improved INR discussed possible discharge home. -3u PRBC 02/07/22 - repeat hgb currently within normal limits  2/16 check CBC Will need to be on Coumadin 6 mg for INR of 1.6 today and monitor H&H closely Negative orthostasis      Paroxysmal atrial fibrillation ([place])- (present on admission) Mechanical aortic valve Continue amiodarone INR subtherapeutic dosing per pharmacy for Coumadin   PSP (progressive supranuclear palsy) ([place])- (present on admission) -With parkinsonism features 2/16 Continue Sinemet   AKI on CKD (chronic kidney disease), stage  IIIb Hyperkalemia Due to prerenal 2/16 improving with gentle hydration and posttransfusion Continue to monitor  Constipation Had bowel movement today appears to have resolved Continue bowel regimen   UTI ruled out- (present on admission) - No signs or symptoms of UTI at this point patient denies hesitancy urgency or dysuria   Hypercholesteremia- (present on admission) -Continue Zocor   HTN (hypertension)- (present on admission) -She is not on chronic meds for this and just takes prn lopressor for HR Continue IV hydralazine as needed   DVT prophylaxis: Warfarin Code Status: Full Family Communication: At bedside  Status is: Inpatient, patient remains inpatient due to requirements of IV treatment, INR subtherapeutic, symptomatic  Dispo: The patient is from: Home              Anticipated d/c is to: Home              Anticipated d/c date is: 24-48 hours              Patient currently not medically stable for discharge.  Consultants:  None  Procedures:  None  Antimicrobials:  None  Subjective: Denies dizziness on standing today, no shortness of breath or chest pain  Objective: Vitals:   02/10/22 0919 02/10/22 1724 02/10/22 2056 02/11/22 0502  BP: 110/60 131/60 137/80 135/64  Pulse: 69 66 79 67  Resp: 17 20 18 16   Temp: 98.1 F (36.7 C) 98.3 F (36.8 C) 98.5 F (36.9 C) 98.5 F (36.9 C)  TempSrc:    Oral  SpO2: 98% 98% 97% 91%  Weight:      Height:        Intake/Output Summary (Last 24 hours) at 02/11/2022 0816 Last data filed at 02/11/2022 0615 Gross per  24 hour  Intake 941.13 ml  Output 0 ml  Net 941.13 ml   Filed Weights   02/05/22 0617 02/05/22 1942  Weight: 54 kg 57.2 kg    Examination: Calm, NAD Cta no w/r Reg s1/s2 no gallop Soft benign +bs No edema Aaoxox3  Mood and affect appropriate in current setting       Data Reviewed: I have personally reviewed following labs and imaging studies  CBC: Recent Labs  Lab 02/05/22 0629  02/06/22 0113 02/06/22 1209 02/08/22 0647 02/08/22 1700 02/09/22 0736 02/09/22 1519 02/10/22 0425  WBC 6.5 7.9  --   --   --   --   --  7.5  HGB 10.8* 7.5*   < > 9.0* 9.3* 10.1* 9.5* 8.9*  HCT 34.1* 23.0*   < > 27.3* 27.6* 29.6* 28.5* 27.2*  MCV 103.3* 102.2*  --   --   --   --   --  91.0  PLT 211 183  --   --   --   --   --  142*   < > = values in this interval not displayed.   Basic Metabolic Panel: Recent Labs  Lab 02/07/22 0556 02/07/22 1337 02/08/22 0647 02/09/22 0736 02/10/22 0425 02/11/22 0245  NA 133* 128* 127* 130* 132*  --   K 5.5* 4.9 4.6 4.3 4.4  --   CL 97* 93* 93* 95* 98  --   CO2 25 23 23 26 26   --   GLUCOSE 112* 134* 109* 94 97  --   BUN 45* 50* 50* 48* 51*  --   CREATININE 4.92* 5.12* 4.30* 3.32* 2.56* 2.20*  CALCIUM 8.8* 8.2* 8.3* 9.0 8.4*  --    GFR: Estimated Creatinine Clearance: 15.5 mL/min (A) (by C-G formula based on SCr of 2.2 mg/dL (H)). Liver Function Tests: Recent Labs  Lab 02/05/22 0629  AST 39  ALT <5  ALKPHOS 79  BILITOT 1.0  PROT 6.0*  ALBUMIN 3.3*   Recent Labs  Lab 02/05/22 0629  LIPASE 27   No results for input(s): AMMONIA in the last 168 hours. Coagulation Profile: Recent Labs  Lab 02/08/22 0647 02/09/22 0736 02/09/22 1002 02/10/22 0425 02/11/22 0245  INR 4.5* 1.8* 1.8* 1.6* 1.6*   Cardiac Enzymes: No results for input(s): CKTOTAL, CKMB, CKMBINDEX, TROPONINI in the last 168 hours. BNP (last 3 results) No results for input(s): PROBNP in the last 8760 hours. HbA1C: No results for input(s): HGBA1C in the last 72 hours. CBG: No results for input(s): GLUCAP in the last 168 hours. Lipid Profile: No results for input(s): CHOL, HDL, LDLCALC, TRIG, CHOLHDL, LDLDIRECT in the last 72 hours. Thyroid Function Tests: No results for input(s): TSH, T4TOTAL, FREET4, T3FREE, THYROIDAB in the last 72 hours. Anemia Panel: No results for input(s): VITAMINB12, FOLATE, FERRITIN, TIBC, IRON, RETICCTPCT in the last 72  hours. Sepsis Labs: No results for input(s): PROCALCITON, LATICACIDVEN in the last 168 hours.  Recent Results (from the past 240 hour(s))  Resp Panel by RT-PCR (Flu A&B, Covid) Nasopharyngeal Swab     Status: None   Collection Time: 02/05/22  9:45 AM   Specimen: Nasopharyngeal Swab; Nasopharyngeal(NP) swabs in vial transport medium  Result Value Ref Range Status   SARS Coronavirus 2 by RT PCR NEGATIVE NEGATIVE Final    Comment: (NOTE) SARS-CoV-2 target nucleic acids are NOT DETECTED.  The SARS-CoV-2 RNA is generally detectable in upper respiratory specimens during the acute phase of infection. The lowest concentration of SARS-CoV-2 viral copies this  assay can detect is 138 copies/mL. A negative result does not preclude SARS-Cov-2 infection and should not be used as the sole basis for treatment or other patient management decisions. A negative result may occur with  improper specimen collection/handling, submission of specimen other than nasopharyngeal swab, presence of viral mutation(s) within the areas targeted by this assay, and inadequate number of viral copies(<138 copies/mL). A negative result must be combined with clinical observations, patient history, and epidemiological information. The expected result is Negative.  Fact Sheet for Patients:  EntrepreneurPulse.com.au  Fact Sheet for Healthcare Providers:  IncredibleEmployment.be  This test is no t yet approved or cleared by the [place] FDA and  has been authorized for detection and/or diagnosis of SARS-CoV-2 by FDA under an Emergency Use Authorization (EUA). This EUA will remain  in effect (meaning this test can be used) for the duration of the COVID-19 declaration under Section 564(b)(1) of the Act, 21 U.S.C.section 360bbb-3(b)(1), unless the authorization is terminated  or revoked sooner.       Influenza A by PCR NEGATIVE NEGATIVE Final   Influenza B by PCR NEGATIVE  NEGATIVE Final    Comment: (NOTE) The Xpert Xpress SARS-CoV-2/FLU/RSV plus assay is intended as an aid in the diagnosis of influenza from Nasopharyngeal swab specimens and should not be used as a sole basis for treatment. Nasal washings and aspirates are unacceptable for Xpert Xpress SARS-CoV-2/FLU/RSV testing.  Fact Sheet for Patients: EntrepreneurPulse.com.au  Fact Sheet for Healthcare Providers: IncredibleEmployment.be  This test is not yet approved or cleared by the [place] FDA and has been authorized for detection and/or diagnosis of SARS-CoV-2 by FDA under an Emergency Use Authorization (EUA). This EUA will remain in effect (meaning this test can be used) for the duration of the COVID-19 declaration under Section 564(b)(1) of the Act, 21 U.S.C. section 360bbb-3(b)(1), unless the authorization is terminated or revoked.  Performed at [facility], [place] [address], [place], [place] [address]   Urine Culture     Status: Abnormal   Collection Time: 02/05/22  3:11 PM   Specimen: Urine, Clean Catch  Result Value Ref Range Status   Specimen Description URINE, CLEAN CATCH  Final   Special Requests   Final    NONE Performed at [facility], [place] [address], [place], [place] [address]    Culture MULTIPLE SPECIES PRESENT, SUGGEST RECOLLECTION (A)  Final   Report Status 02/07/2022 FINAL  Final          Radiology Studies: No results found.  Scheduled Meds:  amiodarone  200 mg Oral Once per day on Mon Tue Wed Thu Fri Sat   carbidopa-levodopa  2 tablet Oral TID   docusate sodium  100 mg Oral BID   loratadine  10 mg Oral Daily   melatonin  5 mg Oral QHS   pantoprazole  40 mg Oral Daily   polyethylene glycol  17 g Oral BID   senna-docusate  1 tablet Oral Daily   simvastatin  20 mg Oral QPM   Warfarin - Pharmacist Dosing Inpatient   Does not apply q1600   Continuous Infusions:  sodium chloride 50 mL/hr at 02/10/22 1444    methocarbamol (ROBAXIN) IV Stopped (02/07/22 1400)    LOS: 4 days   Time spent: 41min  Nolberto Hanlon, MD [facility]  If 7PM-7AM, please contact night-coverage [email or web address]  02/11/2022, 8:16 AM

## 2022-02-11 NOTE — Progress Notes (Signed)
ANTICOAGULATION CONSULT NOTE - Follow Up Consult  Pharmacy Consult for Warfarin Indication:  mechanical AVR and atrial fibrillation  Allergies  Allergen Reactions   Demerol [Meperidine] Shortness Of Breath and Swelling   Oxycodone Other (See Comments)    Hallucinations    Adhesive [Tape] Other (See Comments)    Redness and skin tears   Ivp Dye [Iodinated Contrast Media] Hives    OK with benadryl pre-med (50mg  one hour before receiving iodinated contrast agent)   Phenergan [Promethazine] Other (See Comments)    Avoids due to reaction with current medications    Patient Measurements: Height: 4\' 11"  (149.9 cm) Weight: 57.2 kg (126 lb 1.7 oz) IBW/kg (Calculated) : 43.2  Vital Signs: Temp: 98.8 F (37.1 C) (02/16 0843) Temp Source: Oral (02/16 0843) BP: 140/78 (02/16 0843) Pulse Rate: 71 (02/16 0843)  Labs: Recent Labs    02/09/22 0736 02/09/22 1002 02/09/22 1519 02/10/22 0425 02/11/22 0245  HGB 10.1*  --  9.5* 8.9*  --   HCT 29.6*  --  28.5* 27.2*  --   PLT  --   --   --  142*  --   LABPROT 20.6* 21.3*  --  18.8* 19.3*  INR 1.8* 1.8*  --  1.6* 1.6*  CREATININE 3.32*  --   --  2.56* 2.20*    Estimated Creatinine Clearance: 15.5 mL/min (A) (by C-G formula based on SCr of 2.2 mg/dL (H)).  Assessment: [name] presenting with chest pain, has rectus sheath hematoma, she is on warfarin PTA for mechanical AVR from 2007, INR on admission 3.2. Hgb down to 6.1 on 2/12 and received PRBCs x 3.    We have been using a reduced goal INR of 2-3, while inpatient, due to hematoma. PCP or cardiologist will need to reassess if need/indication exists for increased INR goal.   INR remains 1.6 today, Hgb down slightly to 8.9 on 2/15. No CBC today. Considering adding Lovenox bridge if INR doesn't trend up.   SCDs added.   PTA dosing: 5 mg MWF, 2.5 mg TTSS  Goal of Therapy:  INR 2-3 (previously documented as 2.5-3.5) Monitor platelets by anticoagulation protocol: Yes   Plan:  Warfarin  6 mg x 1 today, per discussion with Dr. Kurtis Bushman. Daily PT/INR. Monitor for bleeding.   Arty Baumgartner, RPh 02/11/2022,12:57 PM

## 2022-02-11 NOTE — Progress Notes (Signed)
Physical Therapy Treatment Patient Details Name: [name] MRN: [id number] DOB: 1941/04/29 Today's Date: 02/11/2022   History of Present Illness 81 y.o. female with medical history significant of remote breast cancer;  HTN; HLD; afib on Coumadin; Parkinson's; and AVR presenting with chest and abdominal pain.  She reports left sided chest and abdominal pain x 2-3 days.    PT Comments    Patient progressing towards physical therapy goals. Patient ambulated 150' with RW and minA. Patient tends to drift towards R side. Patient seems to be improving with ability to get in/out of bed compared to previous sessions. Husband is supportive and present. D/c plan remains appropriate.     Recommendations for follow up therapy are one component of a multi-disciplinary discharge planning process, led by the attending physician.  Recommendations may be updated based on patient status, additional functional criteria and insurance authorization.  Follow Up Recommendations  Home health PT     Assistance Recommended at Discharge Frequent or constant Supervision/Assistance  Patient can return home with the following A lot of help with walking and/or transfers;Help with stairs or ramp for entrance   Equipment Recommendations  Other (comment) (Consider shower chair -- will defer to OT)    Recommendations for Other Services       Precautions / Restrictions Precautions Precautions: Fall Precaution Comments: Patient with Progressive Supranuclear Palsy Restrictions Weight Bearing Restrictions: No Other Position/Activity Restrictions: L abdominal/flank pain.     Mobility  Bed Mobility Overal bed mobility: Needs Assistance Bed Mobility: Rolling, Sidelying to Sit, Sit to Sidelying Rolling: Supervision Sidelying to sit: Supervision     Sit to sidelying: Min assist General bed mobility comments: minA for bringing R LE back on bed. Good recall of log roll technique    Transfers Overall  transfer level: Needs assistance Equipment used: Rolling Alieah Brinton (2 wheels) Transfers: Sit to/from Stand Sit to Stand: Min guard           General transfer comment: min guard for safety. Increased time and effort to stand    Ambulation/Gait Ambulation/Gait assistance: Min assist Gait Distance (Feet): 150 Feet Assistive device: Rolling Neo Yepiz (2 wheels) Gait Pattern/deviations: Step-through pattern, Decreased stride length, Trunk flexed Gait velocity: decreased     General Gait Details: minA for balance and RW management. Patient with short step through steps. Listing to R during first half of ambulation but able to maintain middle of hallway on second half   Stairs             Wheelchair Mobility    Modified Rankin (Stroke Patients Only)       Balance Overall balance assessment: Needs assistance Sitting-balance support: No upper extremity supported, Feet supported Sitting balance-Leahy Scale: Fair     Standing balance support: Bilateral upper extremity supported, During functional activity Standing balance-Leahy Scale: Poor                              Cognition Arousal/Alertness: Awake/alert Behavior During Therapy: WFL for tasks assessed/performed Overall Cognitive Status: Within Functional Limits for tasks assessed                                          Exercises      General Comments        Pertinent Vitals/Pain Pain Assessment Pain Assessment: Faces Faces Pain Scale: Hurts a little  bit Pain Location: L side of chest and trunk Pain Descriptors / Indicators: Grimacing, Guarding Pain Intervention(s): Monitored during session    Home Living                          Prior Function            PT Goals (current goals can now be found in the care plan section) Acute Rehab PT Goals Patient Stated Goal: To have a better, easier method for getting up and OOB PT Goal Formulation: With patient/family Time  For Goal Achievement: 02/20/22 Potential to Achieve Goals: Good Progress towards PT goals: Progressing toward goals    Frequency    Min 3X/week      PT Plan Current plan remains appropriate    Co-evaluation              AM-PAC PT "6 Clicks" Mobility   Outcome Measure  Help needed turning from your back to your side while in a flat bed without using bedrails?: A Little Help needed moving from lying on your back to sitting on the side of a flat bed without using bedrails?: A Little Help needed moving to and from a bed to a chair (including a wheelchair)?: A Little Help needed standing up from a chair using your arms (e.g., wheelchair or bedside chair)?: A Little Help needed to walk in hospital room?: A Little Help needed climbing 3-5 steps with a railing? : A Lot 6 Click Score: 17    End of Session Equipment Utilized During Treatment: Gait belt Activity Tolerance: Patient tolerated treatment well Patient left: in bed;with call bell/phone within reach;with family/visitor present Nurse Communication: Mobility status PT Visit Diagnosis: Unsteadiness on feet (R26.81);Other abnormalities of gait and mobility (R26.89);Muscle weakness (generalized) (M62.81);Other symptoms and signs involving the nervous system (R29.898)     Time: 6568-1275 PT Time Calculation (min) (ACUTE ONLY): 28 min  Charges:  $Gait Training: 23-37 mins                     Danyetta Gillham A. Gilford Rile PT, DPT Acute Rehabilitation Services Pager [phone] Office [phone]    Linna Hoff 02/11/2022, 4:51 PM

## 2022-02-12 LAB — PROTIME-INR
INR: 1.8 — ABNORMAL HIGH (ref 0.8–1.2)
Prothrombin Time: 21.1 seconds — ABNORMAL HIGH (ref 11.4–15.2)

## 2022-02-12 LAB — HEMOGLOBIN AND HEMATOCRIT, BLOOD
HCT: 28.1 % — ABNORMAL LOW (ref 36.0–46.0)
Hemoglobin: 9.1 g/dL — ABNORMAL LOW (ref 12.0–15.0)

## 2022-02-12 LAB — CREATININE, SERUM
Creatinine, Ser: 1.7 mg/dL — ABNORMAL HIGH (ref 0.44–1.00)
GFR, Estimated: 30 mL/min — ABNORMAL LOW (ref >60)

## 2022-02-12 MED ORDER — WARFARIN SODIUM 5 MG PO TABS
5.0000 mg | ORAL_TABLET | Freq: Once | ORAL | Status: AC
  Administered 2022-02-12: 5 mg via ORAL
  Filled 2022-02-12: qty 1

## 2022-02-12 MED ORDER — WARFARIN SODIUM 1 MG PO TABS
1.0000 mg | ORAL_TABLET | Freq: Once | ORAL | Status: AC
  Administered 2022-02-12: 1 mg via ORAL
  Filled 2022-02-12: qty 1

## 2022-02-12 MED ORDER — WARFARIN - PHYSICIAN DOSING INPATIENT: Freq: Every day | Status: DC

## 2022-02-12 NOTE — TOC Progression Note (Signed)
Transition of Care [facility]) - Progression Note    Patient Details  Name: [name] MRN: [id number] Date of Birth: 07-25-1941  Transition of Care [facility]) CM/SW Contact  Tom-Johnson, Renea Ee, RN Phone Number: 02/12/2022, 3:07 PM  Clinical Narrative:     Patient's INR is at 1.8 today. Not therapeutic. Goal is between 2-3. Tentative d/c home this weekend. CM will continue to monitor with needs.   Expected Discharge Plan: [place] Barriers to Discharge: Continued Medical Work up  Expected Discharge Plan and Services Expected Discharge Plan: [place]   Discharge Planning Services: CM Consult Post Acute Care Choice: [place] arrangements for the past 2 months: Single Family Home                           HH Arranged: PT, OT HH Agency: [place] Date [facility] Agency Contacted: 02/08/22 Time HH Agency Contacted: 1110 Representative spoke with at [place]: [place] ([place]) Interventions    Readmission Risk Interventions Readmission Risk Prevention Plan 02/11/2022  Transportation Screening Complete  PCP or Specialist Appt within 3-5 Days Complete  HRI or [place] Complete  Social Work Consult for [place] Planning/Counseling Complete  Palliative Care Screening Not Applicable  Medication Review [employer] photographer) Complete  Some recent data might be hidden

## 2022-02-12 NOTE — Progress Notes (Signed)
ANTICOAGULATION CONSULT NOTE - Follow Up Consult  Pharmacy Consult for Warfarin Indication:  mechanical AVR and atrial fibrillation  Allergies  Allergen Reactions   Demerol [Meperidine] Shortness Of Breath and Swelling   Oxycodone Other (See Comments)    Hallucinations    Adhesive [Tape] Other (See Comments)    Redness and skin tears   Ivp Dye [Iodinated Contrast Media] Hives    OK with benadryl pre-med (50mg  one hour before receiving iodinated contrast agent)   Phenergan [Promethazine] Other (See Comments)    Avoids due to reaction with current medications    Patient Measurements: Height: 4\' 11"  (149.9 cm) Weight: 57.2 kg (126 lb 1.7 oz) IBW/kg (Calculated) : 43.2  Vital Signs: Temp: 98.5 F (36.9 C) (02/17 0907) Temp Source: Oral (02/17 0907) BP: 132/60 (02/17 0839) Pulse Rate: 62 (02/17 0839)  Labs: Recent Labs    02/09/22 1519 02/10/22 0425 02/11/22 0245 02/12/22 0608  HGB 9.5* 8.9*  --  9.1*  HCT 28.5* 27.2*  --  28.1*  PLT  --  142*  --   --   LABPROT  --  18.8* 19.3* 21.1*  INR  --  1.6* 1.6* 1.8*  CREATININE  --  2.56* 2.20*  --      Estimated Creatinine Clearance: 15.5 mL/min (A) (by C-G formula based on SCr of 2.2 mg/dL (H)).  Assessment: 28 YOF presenting with chest pain, has rectus sheath hematoma, she is on warfarin PTA for mechanical AVR from 2007, INR on admission 3.2. Hgb down to 6.1 on 2/12 and received PRBCs x 3.    We have been using a reduced goal INR of 2-3, while inpatient, due to hematoma. Dr. Martinique agrees with keeping her lower goal, especially d/t frequent falls.   INR remains subtherapeutic today, but is up to 1.8, Hgb stable at 9.1. SCDs documented as on.   PTA dosing: 5 mg MWF, 2.5 mg TTSS  Goal of Therapy:  INR 2-3 (previously documented as 2.5-3.5) Monitor platelets by anticoagulation protocol: Yes    Plan:  Per Dr. Martinique (Outpatient Cardiologist), goal INR should continue at lower range of 2-3. Please include this update  in discharge notes.  Warfarin 5 mg x 1 today Monitor daily INR, CBC, clinical course, s/sx of bleed, PO intake/diet, Drug-Drug Interactions Could consider Lovenox bridge if INR doesn't trend up.    Thank you for allowing [place] to participate in this patients care. Jens Som, PharmD 02/12/2022 12:11 PM  **Pharmacist phone directory can be found on [email or web address] listed under [place]**

## 2022-02-12 NOTE — Progress Notes (Signed)
PROGRESS NOTE    Tabor Bartram  FIE:[id number] DOB: 15-Jul-1941 DOA: 02/05/2022 PCP: Donnajean Lopes, MD   Brief Narrative:  [name] is a 81 y.o. female with medical history significant of remote breast cancer;  HTN; HLD; afib/mechanical valve on Coumadin; Parkinson's; and AVR presenting with pelvic/abdominal pain. Found to have moderate size left rectus sheath hematoma.  Worsening anemia and admitted for close observation and transfusion.  2/15 INR 1.6 today.  Family at bedside.  Patient complains when she stands she becomes mildly lightheaded. Having small amounts of hard stool per husband's  2/16 INR 1.6 today 2/17 coumadin 6mg  given yesteday , inr 1.8. spoke to husband and pt about inr result. If inr therapeutic they will have inr checked on Monday by pcp. He plans to call pcp office today and make the appointment.   Assessment & Plan:   Principal Problem:   Rectus sheath hematoma, initial encounter Active Problems:   S/P AVR (aortic valve replacement)   HTN (hypertension)   Hypercholesteremia   Chronic anticoagulation   Symptomatic anemia   CKD (chronic kidney disease), stage III (HCC)   Parkinsonism (HCC)   PSP (progressive supranuclear palsy) (HCC)   Paroxysmal atrial fibrillation (HCC)   Pressure injury of skin   Rectus sheath hematoma, initial encounter- (present on admission) Symptomatic anemia, POA, improving -Unclear etiology - denies trauma, possibly spontaneous in the setting of anticoagulation -Hemoglobin somewhat labile over the last 24 hours, if stabilizing tomorrow with improved INR discussed possible discharge home. -3u PRBC 02/07/22 - repeat hgb currently within normal limits  Negative orthostasis 2/17 h/h stable Inr subtherapeutic. Avoiding bridging due to hematoma Will continue to monitor      Paroxysmal atrial fibrillation (HCC)- (present on admission) Mechanical aortic valve Continue amiodarone 2/17 inr 1.8 after 6mg   dose. Will give another 6mg  today. Ck inr in am.     PSP (progressive supranuclear palsy) ([place])- (present on admission) -With parkinsonism features 2/17 continue Sinemet     AKI on CKD (chronic kidney disease), stage IIIb Hyperkalemia Due to prerenal 2/17 improving with gentle hydration and posttransfusion  Continue to monitor  Continue ivf   Constipation Resolved. Continue bowel regimen   UTI ruled out- (present on admission) - No signs or symptoms of UTI at this point patient denies hesitancy urgency or dysuria   Hypercholesteremia- (present on admission) -Continue Zocor   HTN (hypertension)- (present on admission) -She is not on chronic meds for this and just takes prn lopressor for HR Continue IV hydralazine as needed   DVT prophylaxis: Warfarin Code Status: Full Family Communication: At bedside  Status is: Inpatient, patient remains inpatient due to requirements of IV treatment, INR subtherapeutic, symptomatic  Dispo: The patient is from: Home              Anticipated d/c is to: Home              Anticipated d/c date is: 24-48 hours              Patient currently not medically stable for discharge.inr needs to be therapeutic and monitor for bleed  Consultants:  None  Procedures:  None  Antimicrobials:  None  Subjective: Denies sob, cp, dizziness.  Objective: Vitals:   02/11/22 2115 02/12/22 0505 02/12/22 0839 02/12/22 0907  BP: 130/65 (!) 142/76 132/60   Pulse: 64 (!) 58 62   Resp: 18 17  18   Temp: 99 F (37.2 C) 97.8 F (36.6 C)  98.5 F (36.9 C)  TempSrc: Oral Oral  Oral  SpO2: 98% 97%  99%  Weight:      Height:        Intake/Output Summary (Last 24 hours) at 02/12/2022 1348 Last data filed at 02/12/2022 1228 Gross per 24 hour  Intake 1378.3 ml  Output 400 ml  Net 978.3 ml   Filed Weights   02/05/22 0617 02/05/22 1942  Weight: 54 kg 57.2 kg    Examination: Calm, NAD Cta no w/r Reg s1/s2 no gallop Soft benign +bs No  edema Aaoxox3  Mood and affect appropriate in current setting       Data Reviewed: I have personally reviewed following labs and imaging studies  CBC: Recent Labs  Lab 02/06/22 0113 02/06/22 1209 02/08/22 1700 02/09/22 0736 02/09/22 1519 02/10/22 0425 02/12/22 0608  WBC 7.9  --   --   --   --  7.5  --   HGB 7.5*   < > 9.3* 10.1* 9.5* 8.9* 9.1*  HCT 23.0*   < > 27.6* 29.6* 28.5* 27.2* 28.1*  MCV 102.2*  --   --   --   --  91.0  --   PLT 183  --   --   --   --  142*  --    < > = values in this interval not displayed.   Basic Metabolic Panel: Recent Labs  Lab 02/07/22 0556 02/07/22 1337 02/08/22 0647 02/09/22 0736 02/10/22 0425 02/11/22 0245  NA 133* 128* 127* 130* 132*  --   K 5.5* 4.9 4.6 4.3 4.4  --   CL 97* 93* 93* 95* 98  --   CO2 25 23 23 26 26   --   GLUCOSE 112* 134* 109* 94 97  --   BUN 45* 50* 50* 48* 51*  --   CREATININE 4.92* 5.12* 4.30* 3.32* 2.56* 2.20*  CALCIUM 8.8* 8.2* 8.3* 9.0 8.4*  --    GFR: Estimated Creatinine Clearance: 15.5 mL/min (A) (by C-G formula based on SCr of 2.2 mg/dL (H)). Liver Function Tests: No results for input(s): AST, ALT, ALKPHOS, BILITOT, PROT, ALBUMIN in the last 168 hours.  No results for input(s): LIPASE, AMYLASE in the last 168 hours.  No results for input(s): AMMONIA in the last 168 hours. Coagulation Profile: Recent Labs  Lab 02/09/22 0736 02/09/22 1002 02/10/22 0425 02/11/22 0245 02/12/22 0608  INR 1.8* 1.8* 1.6* 1.6* 1.8*   Cardiac Enzymes: No results for input(s): CKTOTAL, CKMB, CKMBINDEX, TROPONINI in the last 168 hours. BNP (last 3 results) No results for input(s): PROBNP in the last 8760 hours. HbA1C: No results for input(s): HGBA1C in the last 72 hours. CBG: No results for input(s): GLUCAP in the last 168 hours. Lipid Profile: No results for input(s): CHOL, HDL, LDLCALC, TRIG, CHOLHDL, LDLDIRECT in the last 72 hours. Thyroid Function Tests: No results for input(s): TSH, T4TOTAL, FREET4, T3FREE,  THYROIDAB in the last 72 hours. Anemia Panel: No results for input(s): VITAMINB12, FOLATE, FERRITIN, TIBC, IRON, RETICCTPCT in the last 72 hours. Sepsis Labs: No results for input(s): PROCALCITON, LATICACIDVEN in the last 168 hours.  Recent Results (from the past 240 hour(s))  Resp Panel by RT-PCR (Flu A&B, Covid) Nasopharyngeal Swab     Status: None   Collection Time: 02/05/22  9:45 AM   Specimen: Nasopharyngeal Swab; Nasopharyngeal(NP) swabs in vial transport medium  Result Value Ref Range Status   SARS Coronavirus 2 by RT PCR NEGATIVE NEGATIVE Final    Comment: (NOTE) SARS-CoV-2 target nucleic acids are NOT  DETECTED.  The SARS-CoV-2 RNA is generally detectable in upper respiratory specimens during the acute phase of infection. The lowest concentration of SARS-CoV-2 viral copies this assay can detect is 138 copies/mL. A negative result does not preclude SARS-Cov-2 infection and should not be used as the sole basis for treatment or other patient management decisions. A negative result may occur with  improper specimen collection/handling, submission of specimen other than nasopharyngeal swab, presence of viral mutation(s) within the areas targeted by this assay, and inadequate number of viral copies(<138 copies/mL). A negative result must be combined with clinical observations, patient history, and epidemiological information. The expected result is Negative.  Fact Sheet for Patients:  EntrepreneurPulse.com.au  Fact Sheet for Healthcare Providers:  IncredibleEmployment.be  This test is no t yet approved or cleared by the [place] FDA and  has been authorized for detection and/or diagnosis of SARS-CoV-2 by FDA under an Emergency Use Authorization (EUA). This EUA will remain  in effect (meaning this test can be used) for the duration of the COVID-19 declaration under Section 564(b)(1) of the Act, 21 U.S.C.section 360bbb-3(b)(1), unless the  authorization is terminated  or revoked sooner.       Influenza A by PCR NEGATIVE NEGATIVE Final   Influenza B by PCR NEGATIVE NEGATIVE Final    Comment: (NOTE) The Xpert Xpress SARS-CoV-2/FLU/RSV plus assay is intended as an aid in the diagnosis of influenza from Nasopharyngeal swab specimens and should not be used as a sole basis for treatment. Nasal washings and aspirates are unacceptable for Xpert Xpress SARS-CoV-2/FLU/RSV testing.  Fact Sheet for Patients: EntrepreneurPulse.com.au  Fact Sheet for Healthcare Providers: IncredibleEmployment.be  This test is not yet approved or cleared by the [place] FDA and has been authorized for detection and/or diagnosis of SARS-CoV-2 by FDA under an Emergency Use Authorization (EUA). This EUA will remain in effect (meaning this test can be used) for the duration of the COVID-19 declaration under Section 564(b)(1) of the Act, 21 U.S.C. section 360bbb-3(b)(1), unless the authorization is terminated or revoked.  Performed at [facility], [place] [address] [place], [place] [address]   Urine Culture     Status: Abnormal   Collection Time: 02/05/22  3:11 PM   Specimen: Urine, Clean Catch  Result Value Ref Range Status   Specimen Description URINE, CLEAN CATCH  Final   Special Requests   Final    NONE Performed at [facility], [place] [address], [place], [place] [address]    Culture MULTIPLE SPECIES PRESENT, SUGGEST RECOLLECTION (A)  Final   Report Status 02/07/2022 FINAL  Final          Radiology Studies: No results found.  Scheduled Meds:  amiodarone  200 mg Oral Once per day on Mon Tue Wed Thu Fri Sat   carbidopa-levodopa  2 tablet Oral TID   docusate sodium  100 mg Oral BID   loratadine  10 mg Oral Daily   melatonin  5 mg Oral QHS   pantoprazole  40 mg Oral Daily   polyethylene glycol  17 g Oral BID   senna-docusate  1 tablet Oral Daily   simvastatin  20 mg Oral QPM    Continuous Infusions:  sodium chloride 50 mL/hr at 02/11/22 1717   methocarbamol (ROBAXIN) IV Stopped (02/07/22 1400)    LOS: 5 days   Time spent: 59min  Nolberto Hanlon, MD [facility]  If 7PM-7AM, please contact night-coverage [email or web address]  02/12/2022, 1:48 PM

## 2022-02-13 LAB — PROTIME-INR
INR: 2.1 — ABNORMAL HIGH (ref 0.8–1.2)
Prothrombin Time: 23.2 seconds — ABNORMAL HIGH (ref 11.4–15.2)

## 2022-02-13 LAB — SODIUM: Sodium: 137 mmol/L (ref 135–145)

## 2022-02-13 LAB — CREATININE, SERUM
Creatinine, Ser: 1.53 mg/dL — ABNORMAL HIGH (ref 0.44–1.00)
GFR, Estimated: 34 mL/min — ABNORMAL LOW (ref >60)

## 2022-02-13 MED ORDER — POLYETHYLENE GLYCOL 3350 17 G PO PACK: 17.0000 g | PACK | Freq: Every day | ORAL | 0 refills | Status: AC

## 2022-02-13 MED ORDER — DOCUSATE SODIUM 100 MG PO CAPS: 100.0000 mg | ORAL_CAPSULE | Freq: Two times a day (BID) | ORAL | 0 refills | Status: AC | PRN

## 2022-02-13 NOTE — Plan of Care (Signed)
  Problem: Education: Goal: Knowledge of General Education information will improve Description: Including pain rating scale, medication(s)/side effects and non-pharmacologic comfort measures Outcome: Adequate for Discharge   

## 2022-02-13 NOTE — Discharge Summary (Signed)
[name] KDT:[id number] DOB: 10/01/1941 DOA: 02/05/2022  PCP: Donnajean Lopes, MD  Admit date: 02/05/2022 Discharge date: 02/14/2022  Admitted From: Home Disposition: Home  Recommendations for Outpatient Follow-up:  Follow up with PCP in 1 week Please obtain BMP/CBC in one week Please follow up cardiology in 1 week  Home Health: Yes   Discharge Condition:Stable CODE STATUS: Full Diet recommendation: Heart Healthy  Brief/Interim Summary: Per HPI:  Discharge Diagnoses:   [name] is a 81 y.o. female with medical history significant of remote breast cancer;  HTN; HLD; afib on Coumadin; Parkinson's; and AVR presenting with chest and abdominal pain.  She reports left sided chest and abdominal pain x 2-3 days, worsening.  She is on Coumadin for a mechanical valve.  She a Parkinson's related condition and is very unstable; they are not sure if she had an injury to the area or not.  She also struggles to get into/out of bed.  She does not have any urinary symptoms other than chronic frequency. She was found with spontaneous rectus sheath hematoma while on coumain for mechanical valve. Surgery was called and nothing to do. Her h/h was followed  after receiving PRBC transfusion , which remained stable then.  inr was subtherapeutic and she was clinically stable, her coumadin was resumed . Was her INR was therapeutic and she remained stable and also her H&H was stable she was discharged home.  She was encouraged to follow-up with her cardiologist for dose adjustments and INR goals.  She does have a follow-up in 2 days with PCP for INR check.  Husband was updated at bedside.  Rectus sheath hematoma, initial encounter- (present on admission) Symptomatic anemia, POA, improving -Unclear etiology - denies trauma, possibly spontaneous in the setting of anticoagulation --s/p 3u PRBC 02/07/22 -H/H remained stable.           Paroxysmal atrial fibrillation (HCC)- (present on  admission) Mechanical aortic valve Continue amiodarone Back on coumadin, therapeutic on discharge Will need to f/u with cardiology to discuss INR ranges.      PSP (progressive supranuclear palsy) ([place])- (present on admission) Continue sinemet     AKI on CKD (chronic kidney disease), stage IIIb Hyperkalemia Due to prerenal improving with gentle hydration and posttransfusion       Constipation Placed on bowel regimen and resolved     UTI ruled out- (present on admission) - No signs or symptoms of UTI at this point patient denies hesitancy urgency or dysuria   Hypercholesteremia- (present on admission) Continue home med   HTN (hypertension)- (present on admission) stable  Discharge Instructions  Discharge Instructions     Call MD for:  severe uncontrolled pain   Complete by: As directed    Diet - low sodium heart healthy   Complete by: As directed    Discharge instructions   Complete by: As directed    Get INR and CBC , bmp checked on Monday Continue hydration   Discharge wound care:   Complete by: As directed    As above   Increase activity slowly   Complete by: As directed       Allergies as of 02/13/2022       Reactions   Demerol [meperidine] Shortness Of Breath, Swelling   Oxycodone Other (See Comments)   Hallucinations    Adhesive [tape] Other (See Comments)   Redness and skin tears   Ivp Dye [iodinated Contrast Media] Hives   OK with benadryl pre-med (50mg  one hour before receiving  iodinated contrast agent)   Phenergan [promethazine] Other (See Comments)   Avoids due to reaction with current medications        Medication List     STOP taking these medications    acetaminophen 500 MG tablet Commonly known as: TYLENOL   amoxicillin 500 MG capsule Commonly known as: AMOXIL   furosemide 20 MG tablet Commonly known as: LASIX   Klor-Con M20 20 MEQ tablet Generic drug: potassium chloride SA   OVER THE COUNTER MEDICATION       TAKE  these medications    amiodarone 200 MG tablet Commonly known as: Pacerone Take 1 tablet by mouth once daily What changed:  how much to take how to take this when to take this additional instructions   calcium carbonate 500 MG chewable tablet Commonly known as: TUMS - dosed in mg elemental calcium Chew 1 tablet by mouth daily as needed for indigestion or heartburn.   calcium citrate-vitamin D 315-200 MG-UNIT tablet Commonly known as: CITRACAL+D Take 1 tablet by mouth 3 (three) times daily.   carbidopa-levodopa 25-100 MG tablet Commonly known as: SINEMET IR TAKE 2 TABLETS BY MOUTH THREE TIMES DAILY   docusate sodium 100 MG capsule Commonly known as: COLACE Take 1 capsule (100 mg total) by mouth 2 (two) times daily as needed for up to 5 days for mild constipation.   famotidine-calcium carbonate-magnesium hydroxide 10-800-165 MG chewable tablet Commonly known as: PEPCID COMPLETE Chew 1 tablet by mouth daily as needed (heartburn).   fexofenadine 60 MG tablet Commonly known as: ALLEGRA Take 60 mg by mouth at bedtime.   melatonin 5 MG Tabs Take 5 mg by mouth at bedtime.   metoprolol tartrate 25 MG tablet Commonly known as: LOPRESSOR Take 1 tablet (25 mg total) by mouth daily as needed (if heart rate is above 110).   multivitamin tablet Take 1 tablet by mouth daily at 12 noon.   pantoprazole 40 MG tablet Commonly known as: PROTONIX Take 40 mg by mouth daily.   polyethylene glycol 17 g packet Commonly known as: MIRALAX / GLYCOLAX Take 17 g by mouth daily.   PROBIOTIC-10 PO Take 1 capsule by mouth daily.   simvastatin 20 MG tablet Commonly known as: ZOCOR Take 20 mg by mouth every evening.   vitamin B-12 1000 MCG tablet Commonly known as: CYANOCOBALAMIN Take 1,000 mcg by mouth every morning.   Vitamin D3 50 MCG (2000 UT) Tabs Take 2,000 Units by mouth daily.   warfarin 5 MG tablet Commonly known as: COUMADIN Take 2.5-5 mg by mouth every evening. Patient is  taking 2.5mg  on Sunday, Tues, [place]. Sat. Patient is taking 5 mg on Monday, Wed, Friday.               Discharge Care Instructions  (From admission, onward)           Start     Ordered   02/13/22 0000  Discharge wound care:       Comments: As above   02/13/22 0843            Follow-up Information     [facility] Follow up.   Specialty: Home Health Services Why: Someone will call you to schedule first home visit. Contact information: [address] [place] [address] [phone]         [facility] .          [place], Peter M, MD Follow up in 1 week(s).   Specialty: Cardiology  Contact information: [place] [address] [phone]         Donnajean Lopes, MD Follow up in 2 day(s).   Specialty: Internal Medicine Why: inr and cbc Contact information: [place] [address] [phone]                Allergies  Allergen Reactions   Demerol [Meperidine] Shortness Of Breath and Swelling   Oxycodone Other (See Comments)    Hallucinations    Adhesive [Tape] Other (See Comments)    Redness and skin tears   Ivp Dye [Iodinated Contrast Media] Hives    OK with benadryl pre-med (50mg  one hour before receiving iodinated contrast agent)   Phenergan [Promethazine] Other (See Comments)    Avoids due to reaction with current medications    Consultations:    Procedures/Studies: CT ABDOMEN PELVIS WO CONTRAST  Result Date: 02/05/2022 CLINICAL DATA:  LEFT lower quadrant abdominal pain EXAM: CT ABDOMEN AND PELVIS WITHOUT CONTRAST TECHNIQUE: Multidetector CT imaging of the abdomen and pelvis was performed following the standard protocol without IV contrast. IV contrast was not utilized since patient is allergic to contrast material. Sagittal and coronal MPR images reconstructed from axial data set. RADIATION DOSE REDUCTION: This exam was performed according to the  departmental dose-optimization program which includes automated exposure control, adjustment of the mA and/or kV according to patient size and/or use of iterative reconstruction technique. COMPARISON:  11/13/2019 FINDINGS: Lower chest: LEFT lower lobe nodule 5 mm image 17 unchanged. Progressive bibasilar subpleural interstitial thickening. Trace LEFT pleural effusion. Hepatobiliary: Small hepatic cysts. Radiodense liver. Gallbladder unremarkable. Pancreas: Atrophic pancreas. Cystic lesion at uncinate process 13 x 19 x 16 mm, unchanged. Spleen: Normal appearance Adrenals/Urinary Tract: Adrenal glands normal appearance. Unremarkable RIGHT kidney. Multiple LEFT renal calculi up to 18 mm and 19 mm in greatest sizes. 2.5 cm peripelvic cyst. Mild dilatation of LEFT renal pelvis with peripelvic stranding. No ureteral calcification or dilatation. Bladder unremarkable. Stomach/Bowel: Diverticulosis of descending and sigmoid colon without evidence of diverticulitis. Large hiatal hernia with questionable wall thickening of the stomach versus artifact from underdistention. Stool throughout colon. Remaining bowel loops unremarkable. Tiny appendiceal stump noted post appendectomy. Vascular/Lymphatic: Atherosclerotic calcifications aorta, iliac arteries, coronary arteries. Dense mitral annular calcification. Post AVR. Atherosclerotic calcification versus graft at proximal ascending thoracic aorta. Enlargement of cardiac chambers. No adenopathy. Reproductive: Uterus surgically absent. Nonvisualization of ovaries. Other: No free air or free fluid.  No hernia. Musculoskeletal: Significant enlargement and hyperdensity of LEFT rectus abdominus 9.2 x 3.9 x 8.6 cm consistent with rectus sheath hematoma. This minimally osseous demineralization. Degenerative disc and facet disease changes of lumbar spine with levoconvex lumbar scoliosis. Extends into the LEFT flank muscular planes IMPRESSION: LEFT rectus sheath hematoma 9.2 x 3.9 x 8.6 cm.  Distal colonic diverticulosis without evidence of diverticulitis. Large hiatal hernia with questionable wall thickening of the stomach versus artifact from underdistention. Multiple LEFT renal calculi up to 18 mm in greatest size. Slightly increased dilatation of LEFT renal pelvis with peripelvic stranding, without UPJ calculus or ureteral dilatation; this likely reflects chronic infection, recommend correlation with urinalysis. Stable cystic lesion at uncinate process of the pancreas 13 x 19 x 16 mm; based on stability and patient age, no follow-up imaging recommended. Stable 5 mm LEFT lower lobe pulmonary nodule, unchanged since 2017. Progressive bibasilar subpleural interstitial thickening question interstitial lung disease. Aortic Atherosclerosis (ICD10-I70.0). Electronically Signed   By: Lavonia Dana M.D.   On: 02/05/2022 09:15  DG Chest 2 View  Result Date: 02/05/2022 CLINICAL DATA:  Pain to the left chest EXAM: CHEST - 2 VIEW COMPARISON:  06/17/2020 FINDINGS: Low volume chest. Retrocardiac density related to a hiatal hernia based on lateral image. Cardiomegaly. Prior AV valve replacement. There is no edema, consolidation, effusion, or pneumothorax. Generalized osteopenic appearance. IMPRESSION: 1. Cardiomegaly without edema. 2. Hiatal hernia. Electronically Signed   By: Jorje Guild M.D.   On: 02/05/2022 06:49      Subjective:  Feels well.  No dizziness or lightheadedness.  No complaints today.   Discharge Exam: Vitals:   02/13/22 0549 02/13/22 0905  BP: 140/75 135/64  Pulse: 65 76  Resp:  18  Temp:  98.2 F (36.8 C)  SpO2:  98%   Vitals:   02/12/22 2219 02/13/22 0546 02/13/22 0549 02/13/22 0905  BP: 136/85 (!) 150/78 140/75 135/64  Pulse: 66 62 65 76  Resp: 18 17  18   Temp: 98.3 F (36.8 C) 98.1 F (36.7 C)  98.2 F (36.8 C)  TempSrc: Oral Oral    SpO2: 99% 98%  98%  Weight:      Height:        General: Pt is alert, awake, not in acute distress Cardiovascular: RRR,  S1/S2 +, no rubs, no gallops Respiratory: CTA bilaterally, no wheezing, no rhonchi Abdominal: Soft, NT, ND, bowel sounds + Extremities: no edema, no cyanosis    The results of significant diagnostics from this hospitalization (including imaging, microbiology, ancillary and laboratory) are listed below for reference.     Microbiology: Recent Results (from the past 240 hour(s))  Resp Panel by RT-PCR (Flu A&B, Covid) Nasopharyngeal Swab     Status: None   Collection Time: 02/05/22  9:45 AM   Specimen: Nasopharyngeal Swab; Nasopharyngeal(NP) swabs in vial transport medium  Result Value Ref Range Status   SARS Coronavirus 2 by RT PCR NEGATIVE NEGATIVE Final    Comment: (NOTE) SARS-CoV-2 target nucleic acids are NOT DETECTED.  The SARS-CoV-2 RNA is generally detectable in upper respiratory specimens during the acute phase of infection. The lowest concentration of SARS-CoV-2 viral copies this assay can detect is 138 copies/mL. A negative result does not preclude SARS-Cov-2 infection and should not be used as the sole basis for treatment or other patient management decisions. A negative result may occur with  improper specimen collection/handling, submission of specimen other than nasopharyngeal swab, presence of viral mutation(s) within the areas targeted by this assay, and inadequate number of viral copies(<138 copies/mL). A negative result must be combined with clinical observations, patient history, and epidemiological information. The expected result is Negative.  Fact Sheet for Patients:  EntrepreneurPulse.com.au  Fact Sheet for Healthcare Providers:  IncredibleEmployment.be  This test is no t yet approved or cleared by the [place] FDA and  has been authorized for detection and/or diagnosis of SARS-CoV-2 by FDA under an Emergency Use Authorization (EUA). This EUA will remain  in effect (meaning this test can be used) for the duration of  the COVID-19 declaration under Section 564(b)(1) of the Act, 21 U.S.C.section 360bbb-3(b)(1), unless the authorization is terminated  or revoked sooner.       Influenza A by PCR NEGATIVE NEGATIVE Final   Influenza B by PCR NEGATIVE NEGATIVE Final    Comment: (NOTE) The Xpert Xpress SARS-CoV-2/FLU/RSV plus assay is intended as an aid in the diagnosis of influenza from Nasopharyngeal swab specimens and should not be used as a sole basis for treatment. Nasal washings and aspirates are unacceptable for  Xpert Xpress SARS-CoV-2/FLU/RSV testing.  Fact Sheet for Patients: EntrepreneurPulse.com.au  Fact Sheet for Healthcare Providers: IncredibleEmployment.be  This test is not yet approved or cleared by the [place] FDA and has been authorized for detection and/or diagnosis of SARS-CoV-2 by FDA under an Emergency Use Authorization (EUA). This EUA will remain in effect (meaning this test can be used) for the duration of the COVID-19 declaration under Section 564(b)(1) of the Act, 21 U.S.C. section 360bbb-3(b)(1), unless the authorization is terminated or revoked.  Performed at [facility], [place] [address], [place], [place] [address]   Urine Culture     Status: Abnormal   Collection Time: 02/05/22  3:11 PM   Specimen: Urine, Clean Catch  Result Value Ref Range Status   Specimen Description URINE, CLEAN CATCH  Final   Special Requests   Final    NONE Performed at [facility], [place] [address], [place], [place] [address]    Culture MULTIPLE SPECIES PRESENT, SUGGEST RECOLLECTION (A)  Final   Report Status 02/07/2022 FINAL  Final     Labs: BNP (last 3 results) No results for input(s): BNP in the last 8760 hours. Basic Metabolic Panel: Recent Labs  Lab 02/08/22 0647 02/09/22 0736 02/10/22 0425 02/11/22 0245 02/12/22 0608 02/13/22 0133  NA 127* 130* 132*  --   --  137  K 4.6 4.3 4.4  --   --   --   CL 93* 95* 98  --   --    --   CO2 23 26 26   --   --   --   GLUCOSE 109* 94 97  --   --   --   BUN 50* 48* 51*  --   --   --   CREATININE 4.30* 3.32* 2.56* 2.20* 1.70* 1.53*  CALCIUM 8.3* 9.0 8.4*  --   --   --    Liver Function Tests: No results for input(s): AST, ALT, ALKPHOS, BILITOT, PROT, ALBUMIN in the last 168 hours. No results for input(s): LIPASE, AMYLASE in the last 168 hours. No results for input(s): AMMONIA in the last 168 hours. CBC: Recent Labs  Lab 02/08/22 1700 02/09/22 0736 02/09/22 1519 02/10/22 0425 02/12/22 0608  WBC  --   --   --  7.5  --   HGB 9.3* 10.1* 9.5* 8.9* 9.1*  HCT 27.6* 29.6* 28.5* 27.2* 28.1*  MCV  --   --   --  91.0  --   PLT  --   --   --  142*  --    Cardiac Enzymes: No results for input(s): CKTOTAL, CKMB, CKMBINDEX, TROPONINI in the last 168 hours. BNP: Invalid input(s): POCBNP CBG: No results for input(s): GLUCAP in the last 168 hours. D-Dimer No results for input(s): DDIMER in the last 72 hours. Hgb A1c No results for input(s): HGBA1C in the last 72 hours. Lipid Profile No results for input(s): CHOL, HDL, LDLCALC, TRIG, CHOLHDL, LDLDIRECT in the last 72 hours. Thyroid function studies No results for input(s): TSH, T4TOTAL, T3FREE, THYROIDAB in the last 72 hours.  Invalid input(s): FREET3 Anemia work up No results for input(s): VITAMINB12, FOLATE, FERRITIN, TIBC, IRON, RETICCTPCT in the last 72 hours. Urinalysis    Component Value Date/Time   COLORURINE YELLOW 02/05/2022 0639   APPEARANCEUR CLOUDY (A) 02/05/2022 0639   LABSPEC 1.017 02/05/2022 0639   PHURINE 5.0 02/05/2022 0639   GLUCOSEU NEGATIVE 02/05/2022 0639   HGBUR MODERATE (A) 02/05/2022 0639   BILIRUBINUR NEGATIVE 02/05/2022  [place] (A) 02/05/2022 0639   PROTEINUR 100 (A) 02/05/2022 0639   NITRITE NEGATIVE 02/05/2022 0639   LEUKOCYTESUR LARGE (A) 02/05/2022 0639   Sepsis Labs Invalid input(s): PROCALCITONIN,  WBC,  LACTICIDVEN Microbiology Recent Results (from the past 240  hour(s))  Resp Panel by RT-PCR (Flu A&B, Covid) Nasopharyngeal Swab     Status: None   Collection Time: 02/05/22  9:45 AM   Specimen: Nasopharyngeal Swab; Nasopharyngeal(NP) swabs in vial transport medium  Result Value Ref Range Status   SARS Coronavirus 2 by RT PCR NEGATIVE NEGATIVE Final    Comment: (NOTE) SARS-CoV-2 target nucleic acids are NOT DETECTED.  The SARS-CoV-2 RNA is generally detectable in upper respiratory specimens during the acute phase of infection. The lowest concentration of SARS-CoV-2 viral copies this assay can detect is 138 copies/mL. A negative result does not preclude SARS-Cov-2 infection and should not be used as the sole basis for treatment or other patient management decisions. A negative result may occur with  improper specimen collection/handling, submission of specimen other than nasopharyngeal swab, presence of viral mutation(s) within the areas targeted by this assay, and inadequate number of viral copies(<138 copies/mL). A negative result must be combined with clinical observations, patient history, and epidemiological information. The expected result is Negative.  Fact Sheet for Patients:  EntrepreneurPulse.com.au  Fact Sheet for Healthcare Providers:  IncredibleEmployment.be  This test is no t yet approved or cleared by the [place] FDA and  has been authorized for detection and/or diagnosis of SARS-CoV-2 by FDA under an Emergency Use Authorization (EUA). This EUA will remain  in effect (meaning this test can be used) for the duration of the COVID-19 declaration under Section 564(b)(1) of the Act, 21 U.S.C.section 360bbb-3(b)(1), unless the authorization is terminated  or revoked sooner.       Influenza A by PCR NEGATIVE NEGATIVE Final   Influenza B by PCR NEGATIVE NEGATIVE Final    Comment: (NOTE) The Xpert Xpress SARS-CoV-2/FLU/RSV plus assay is intended as an aid in the diagnosis of influenza from  Nasopharyngeal swab specimens and should not be used as a sole basis for treatment. Nasal washings and aspirates are unacceptable for Xpert Xpress SARS-CoV-2/FLU/RSV testing.  Fact Sheet for Patients: EntrepreneurPulse.com.au  Fact Sheet for Healthcare Providers: IncredibleEmployment.be  This test is not yet approved or cleared by the [place] FDA and has been authorized for detection and/or diagnosis of SARS-CoV-2 by FDA under an Emergency Use Authorization (EUA). This EUA will remain in effect (meaning this test can be used) for the duration of the COVID-19 declaration under Section 564(b)(1) of the Act, 21 U.S.C. section 360bbb-3(b)(1), unless the authorization is terminated or revoked.  Performed at [facility], [place] [address], [place], [place] [address]   Urine Culture     Status: Abnormal   Collection Time: 02/05/22  3:11 PM   Specimen: Urine, Clean Catch  Result Value Ref Range Status   Specimen Description URINE, CLEAN CATCH  Final   Special Requests   Final    NONE Performed at [facility], [place] [address], [place], [place] [address]    Culture MULTIPLE SPECIES PRESENT, SUGGEST RECOLLECTION (A)  Final   Report Status 02/07/2022 FINAL  Final     Time coordinating discharge: Over 30 minutes  SIGNED:   Nolberto Hanlon, MD  [facility] 02/14/2022, 5:37 PM Pager   If 7PM-7AM, please contact night-coverage [email or web address] Password [email or web address]

## 2022-02-15 DIAGNOSIS — I5023 Acute on chronic systolic (congestive) heart failure: Secondary | ICD-10-CM | POA: Diagnosis not present

## 2022-02-15 DIAGNOSIS — I129 Hypertensive chronic kidney disease with stage 1 through stage 4 chronic kidney disease, or unspecified chronic kidney disease: Secondary | ICD-10-CM | POA: Diagnosis not present

## 2022-02-15 DIAGNOSIS — D649 Anemia, unspecified: Secondary | ICD-10-CM | POA: Diagnosis not present

## 2022-02-15 DIAGNOSIS — Z8739 Personal history of other diseases of the musculoskeletal system and connective tissue: Secondary | ICD-10-CM | POA: Diagnosis not present

## 2022-02-15 DIAGNOSIS — Z952 Presence of prosthetic heart valve: Secondary | ICD-10-CM | POA: Diagnosis not present

## 2022-02-15 DIAGNOSIS — I480 Paroxysmal atrial fibrillation: Secondary | ICD-10-CM | POA: Diagnosis not present

## 2022-02-15 DIAGNOSIS — S301XXA Contusion of abdominal wall, initial encounter: Secondary | ICD-10-CM | POA: Diagnosis not present

## 2022-02-15 DIAGNOSIS — Z09 Encounter for follow-up examination after completed treatment for conditions other than malignant neoplasm: Secondary | ICD-10-CM | POA: Diagnosis not present

## 2022-02-15 DIAGNOSIS — Z7901 Long term (current) use of anticoagulants: Secondary | ICD-10-CM | POA: Diagnosis not present

## 2022-02-16 DIAGNOSIS — I480 Paroxysmal atrial fibrillation: Secondary | ICD-10-CM | POA: Diagnosis not present

## 2022-02-16 DIAGNOSIS — Z87440 Personal history of urinary (tract) infections: Secondary | ICD-10-CM | POA: Diagnosis not present

## 2022-02-16 DIAGNOSIS — I1310 Hypertensive heart and chronic kidney disease without heart failure, with stage 1 through stage 4 chronic kidney disease, or unspecified chronic kidney disease: Secondary | ICD-10-CM | POA: Diagnosis not present

## 2022-02-16 DIAGNOSIS — G20 Parkinson's disease: Secondary | ICD-10-CM | POA: Diagnosis not present

## 2022-02-16 DIAGNOSIS — I2510 Atherosclerotic heart disease of native coronary artery without angina pectoris: Secondary | ICD-10-CM | POA: Diagnosis not present

## 2022-02-16 DIAGNOSIS — Z9181 History of falling: Secondary | ICD-10-CM | POA: Diagnosis not present

## 2022-02-16 DIAGNOSIS — Z7901 Long term (current) use of anticoagulants: Secondary | ICD-10-CM | POA: Diagnosis not present

## 2022-02-16 DIAGNOSIS — Z87442 Personal history of urinary calculi: Secondary | ICD-10-CM | POA: Diagnosis not present

## 2022-02-16 DIAGNOSIS — E7800 Pure hypercholesterolemia, unspecified: Secondary | ICD-10-CM | POA: Diagnosis not present

## 2022-02-16 DIAGNOSIS — I447 Left bundle-branch block, unspecified: Secondary | ICD-10-CM | POA: Diagnosis not present

## 2022-02-16 DIAGNOSIS — S301XXD Contusion of abdominal wall, subsequent encounter: Secondary | ICD-10-CM | POA: Diagnosis not present

## 2022-02-16 DIAGNOSIS — D631 Anemia in chronic kidney disease: Secondary | ICD-10-CM | POA: Diagnosis not present

## 2022-02-16 DIAGNOSIS — K5730 Diverticulosis of large intestine without perforation or abscess without bleeding: Secondary | ICD-10-CM | POA: Diagnosis not present

## 2022-02-16 DIAGNOSIS — N184 Chronic kidney disease, stage 4 (severe): Secondary | ICD-10-CM | POA: Diagnosis not present

## 2022-02-16 DIAGNOSIS — K449 Diaphragmatic hernia without obstruction or gangrene: Secondary | ICD-10-CM | POA: Diagnosis not present

## 2022-02-16 DIAGNOSIS — I700 Atherosclerosis of aorta: Secondary | ICD-10-CM | POA: Diagnosis not present

## 2022-02-16 NOTE — Progress Notes (Addendum)
Cardiology Office Note:    Date:  02/18/2022   ID:  Cecilie, Heidel [birth year], MRN [id number]  PCP:  Donnajean Lopes, MD   [facility] Cardiologist:  Peter Martinique, MD      Referring MD: Donnajean Lopes, MD   Follow-up for atrial fibrillation and hypertension.  History of Present Illness:    Tamryn Popko is a 81 y.o. female with a hx of HTN, atrial fibrillation, PVCs, sinus bradycardia, chronic combined systolic and diastolic CHF, GERD, progressive supranuclear plasty, CKD stage III, status post aortic valve replacement, Parkinson's disease, and history of mastectomy.  She was seen by Dr. Martinique 01/19/2022.  During that time she reported that she had a recent bout of bronchitis.  She was treated with antibiotics and recovered well.  She noted that she was going to need to have a dental extraction.  She was provided with SBE prophylaxis.  She denied chest pain, dyspnea, lower extremity swelling, palpitations.  Her blood pressure at home has been well controlled.  She did report some falls at home.  She reported compliance with her walker and indicated that she was being very careful.  She presented to the emergency department 02/05/2022 and was discharged on 02/13/2022.  She presented with 2-3 days of chest and abdominal pain.  She reported compliance with her Coumadin.  Due to her Parkinson's it was noted that she struggled to get in and out of bed.  She was noted to have chronic frequent urination.  She is noted to have spontaneous rectus sheath hematoma.  Surgery was consulted and reported that no intervention was necessary.  She received PRBCs for decreased H&H.  Her INR was noted to be subtherapeutic and had previously been chronically stable.  Her Coumadin was resumed.  Her INR returned to therapeutic range and her H&H remained stable.  She was encouraged to follow-up with cardiology for dose adjustments with there are no goals.  She presents the  clinic today for follow-up evaluation and states her weight prior to being hospitalized was 120 pounds.  She continues to lose weight slowly.  Her weight today is 129 pounds.  She followed up with her PCP on Monday.  During that time her furosemide was increased to 40 mg x 3 days.  She reports that her hemoglobin continues to improve and was 10.1 at that visit.  She is slowly increasing her physical activity but notes shortness of breath with increased physical activity.  Her blood pressure at home has been 100s-1 teens over 80s-60s.  We reviewed her recent hospitalization.  I will increase her furosemide to 40 mg x 3 days and increase her potassium to 40 mill equivalents x3 days.  Her and her husband report that they will return to her PCP on Monday for follow-up.  I have requested that they have a BMP repeated and send results to [place].  I will give the salty 6 diet sheet, have her continue to wear self daily, continue daily blood pressures, and plan follow-up for 2 months.  Today she denies chest pain, increased shortness of breath, lower extremity edema, fatigue, palpitations, melena, hematuria, hemoptysis, diaphoresis, weakness, presyncope, syncope, orthopnea, and PND.   Past Medical History:  Diagnosis Date   BPV (benign positional vertigo)    Chronic neck pain    C2-3 arthropathy   CKD (chronic kidney disease), stage II    Diverticulosis of colon    GERD (gastroesophageal reflux disease)    Hiatal hernia  History of breast cancer per pt no recurrence   dx 2004-- Left breast DCIS (ER/PR+, HER2 negative) s/p  partial masectomy w/ sln bx and  chemoradiotion (completed 2004)   History of herpes zoster    History of kidney stones    HTN (hypertension)    Hyperlipidemia    Irritable bowel syndrome    LBBB (left bundle branch block)    PAF (paroxysmal atrial fibrillation) (HCC)    a. CHA2DS2VASc = 4-->coumadin.   Parkinsonism [facility])    neurologist-  dr tat   PONV (postoperative nausea and  vomiting)    S/P aortic valve replacement with prosthetic valve    a. 04/13/2006 s/p St Jude mechnical AVR for severe AS;  b. 09/2014 Echo: EF 40-45%, gr1 DD, mild AI/MR, triv TR/PR.   SUI (stress urinary incontinence, female)    Symptomatic anemia    Venous varices      Past Surgical History:  Procedure Laterality Date   ANTERIOR AND POSTERIOR REPAIR  1990   cystocele/ rectocele   AORTIC VALVE REPLACEMENT  04/13/06   dr gerhardt   and Replacement Ascending Aorta (St. Jude mechanical prosthesis)   APPENDECTOMY  child 1950   BREAST EXCISIONAL BIOPSY Left    BREAST LUMPECTOMY Left 01/11/2003   malignant   BREAST SURGERY Left 2004   CARDIAC CATHETERIZATION  02-22-2001   dr Martinique   minimal non-obstructive cad/  mild aortic stenosis and aortic root enlargement/  normal LVF, ef 65%   CARDIAC CATHETERIZATION  03-22-2006    dr Martinique   no significant obstructive cad/  severe aortic stenosis and mild to moderate aortic root enlargement/  upper normal right heart pressures   CARDIOVASCULAR STRESS TEST  09-30-2011   dr Martinique   lexiscan nuclear study w/ apical thinning  vs  small prior apical infarct w/ no significant ischemia/  hypokinesis of the distal septum and apex, ef 50%   CARPAL TUNNEL RELEASE Bilateral left 09-09-2004/  right 2007   CATARACT EXTRACTION W/ INTRAOCULAR LENS  IMPLANT, BILATERAL  2015   COLONOSCOPY N/A 10/03/2014   Procedure: COLONOSCOPY;  Surgeon: [name], MD;  Location: [place];  Service: Endoscopy;  Laterality: N/A;   CYSTOSCOPY WITH RETROGRADE PYELOGRAM, URETEROSCOPY AND STENT PLACEMENT Right 09/17/2016   Procedure: CYSTOSCOPY WITH RIGHT RETROGRADE PYELOGRAM, RIGHT URETEROSCOPY AND STENT PLACEMENT LASER LITHOTRIPSY, RIGHT STENT PLACEMENT;  Surgeon: [name], MD;  Location: [facility];  Service: Urology;  Laterality: Right;   ESOPHAGEAL MANOMETRY N/A 08/12/2014   Procedure: ESOPHAGEAL MANOMETRY (EM);  Surgeon: [name], MD;   Location: [facility];  Service: Endoscopy;  Laterality: N/A;   ESOPHAGOGASTRODUODENOSCOPY N/A 10/03/2014   Procedure: ESOPHAGOGASTRODUODENOSCOPY (EGD);  Surgeon: [name], MD;  Location: [facility] ENDOSCOPY;  Service: Endoscopy;  Laterality: N/A;   ESOPHAGOGASTRODUODENOSCOPY N/A 12/08/2018   Procedure: ESOPHAGOGASTRODUODENOSCOPY (EGD);  Surgeon: Doran Stabler, MD;  Location: [place];  Service: Gastroenterology;  Laterality: N/A;   EXTRACORPOREAL SHOCK WAVE LITHOTRIPSY  1997   FOOT SURGERY Right 2013   hammertoe x2   HOLMIUM LASER APPLICATION Right 4/96/7591   Procedure: HOLMIUM LASER APPLICATION;  Surgeon: [name], MD;  Location: [facility];  Service: Urology;  Laterality: Right;   PERCUTANEOUS NEPHROSTOLITHOTOMY  1993   STONE EXTRACTION WITH BASKET Right 09/17/2016   Procedure: STONE EXTRACTION WITH BASKET;  Surgeon: [name], MD;  Location: [facility];  Service: Urology;  Laterality: Right;   TOTAL ABDOMINAL HYSTERECTOMY  1971  w/ Bilateral Salpingoophorectomy   TRANSTHORACIC ECHOCARDIOGRAM  10/24/2014   dr Martinique   hypokinesis of the basal and mid inferoseptal and inferior walls,  ef 40-45%,  grade 1 diastolic dysfunction/  mechanical bileaflet aortic valve noted w/ mild regur., peak grandient 70mHg, valve area 1.35cm^2/  severe MV calcification without stenosis w/ mild regurg., peak gradient 362mg/  mild LAE/  poss. atrial mass (MRI showed benign lipomatous hypertrophy of atrial septum) /tFrazier ButtR/mild TR    Current Medications: Current Meds  Medication Sig   amiodarone (PACERONE) 200 MG tablet Take 1 tablet by mouth once daily (Patient taking differently: Take 200 mg by mouth See admin instructions. Take 20037my mouth daily except for Sundays.)   calcium carbonate (TUMS - DOSED IN MG ELEMENTAL CALCIUM) 500 MG chewable tablet Chew 1 tablet by mouth daily as needed for indigestion or heartburn.    calcium citrate-vitamin D  (CITRACAL+D) 315-200 MG-UNIT per tablet Take 1 tablet by mouth 3 (three) times daily.   carbidopa-levodopa (SINEMET IR) 25-100 MG tablet TAKE 2 TABLETS BY MOUTH THREE TIMES DAILY   Cholecalciferol (VITAMIN D3) 2000 units TABS Take 2,000 Units by mouth daily.    docusate sodium (COLACE) 100 MG capsule Take 1 capsule (100 mg total) by mouth 2 (two) times daily as needed for up to 5 days for mild constipation.   famotidine-calcium carbonate-magnesium hydroxide (PEPCID COMPLETE) 10-800-165 MG CHEW chewable tablet Chew 1 tablet by mouth daily as needed (heartburn).   fexofenadine (ALLEGRA) 60 MG tablet Take 60 mg by mouth at bedtime.   furosemide (LASIX) 20 MG tablet Take 20 mg by mouth daily.   melatonin 5 MG TABS Take 5 mg by mouth at bedtime.   metoprolol tartrate (LOPRESSOR) 25 MG tablet Take 1 tablet (25 mg total) by mouth daily as needed (if heart rate is above 110).   Multiple Vitamin (MULTIVITAMIN) tablet Take 1 tablet by mouth daily at 12 noon.   pantoprazole (PROTONIX) 40 MG tablet Take 40 mg by mouth daily.   polyethylene glycol (MIRALAX / GLYCOLAX) 17 g packet Take 17 g by mouth daily. (Patient taking differently: Take 17 g by mouth as needed for mild constipation or moderate constipation.)   potassium chloride SA (KLOR-CON M) 20 MEQ tablet Take 20 mEq by mouth daily.   Probiotic Product (PROBIOTIC-10 PO) Take 1 capsule by mouth daily.    simvastatin (ZOCOR) 20 MG tablet Take 20 mg by mouth every evening.   vitamin B-12 (CYANOCOBALAMIN) 1000 MCG tablet Take 1,000 mcg by mouth every morning.   warfarin (COUMADIN) 5 MG tablet Take 2.5-5 mg by mouth every evening. Patient is taking 2.5mg71m Sunday, Tues, ThurStatelinet. Patient is taking 5 mg on Monday, Wed, Friday.     Allergies:   Demerol [meperidine], Oxycodone, Adhesive [tape], Ivp dye [iodinated contrast media], and Phenergan [promethazine]   Social History   Socioeconomic History   Marital status: Married    Spouse name: Not on file    Number of children: 3   Years of education: 11   Highest education level: 11th grade  Occupational History   Occupation: bakePress photographerHER    Comment: retired  Tobacco Use   Smoking status: Never   Smokeless tobacco: Never  Vaping Use   Vaping Use: Never used  Substance and Sexual Activity   Alcohol use: No    Alcohol/week: 0.0 standard drinks   Drug use: No   Sexual activity: Not on file  Other Topics Concern  Not on file  Social History Narrative      1/2 -1 soda a day     Right handed   Two story home   lives with spouse   Social Determinants of Health   Financial Resource Strain: Not on file  Food Insecurity: Not on file  Transportation Needs: Not on file  Physical Activity: Not on file  Stress: Not on file  Social Connections: Not on file     Family History: The patient's family history includes Healthy in her child; Heart disease in her mother; Heart disease (age of onset: 65) in her father; Other in her brother.  ROS:   Please see the history of present illness.     All other systems reviewed and are negative.   Risk Assessment/Calculations:                Physical Exam:    VS:  BP 138/62    Pulse 70    Ht 4' 11.5" (1.511 m)    Wt 129 lb (58.5 kg)    SpO2 96%    BMI 25.62 kg/m     Wt Readings from Last 3 Encounters:  02/18/22 129 lb (58.5 kg)  02/05/22 126 lb 1.7 oz (57.2 kg)  01/19/22 123 lb 6.4 oz (56 kg)     GEN:  Well nourished, well developed in no acute distress HEENT: Normal NECK: No JVD; No carotid bruits LYMPHATICS: No lymphadenopathy CARDIAC: RRR, no murmurs, rubs, gallops, +2 pitting bilateral lower extremity edema.  Aortic click RESPIRATORY:  Clear to auscultation without rales, wheezing or rhonchi  ABDOMEN: Soft, non-tender, non-distended MUSCULOSKELETAL:  No edema; No deformity  SKIN: Warm and dry NEUROLOGIC:  Alert and oriented x 3 PSYCHIATRIC:  Normal affect    EKGs/Labs/Other Studies Reviewed:     The following studies were reviewed today:  Echocardiogram 06/20/2020  IMPRESSIONS     1. Moderate global reduction in LV systolic function; s/p AVR with mild  AI and mean gradient 12 mmHg; biatrial enlargement; mild TR.   2. Left ventricular ejection fraction, by estimation, is 30 to 35%. The  left ventricle has moderately decreased function. The left ventricle  demonstrates global hypokinesis. Left ventricular diastolic parameters are  consistent with Grade II diastolic  dysfunction (pseudonormalization).   3. Right ventricular systolic function is normal. The right ventricular  size is normal. Tricuspid regurgitation signal is inadequate for assessing  PA pressure.   4. Left atrial size was severely dilated.   5. Right atrial size was mildly dilated.   6. The mitral valve is normal in structure. Trivial mitral valve  regurgitation. No evidence of mitral stenosis.   7. The aortic valve has been repaired/replaced. Aortic valve  regurgitation is mild. No aortic stenosis is present. There is a unknown  St. Jude mechanical valve present in the aortic position. Procedure Date:  04/13/2006.   EKG:  EKG is  ordered today.  The ekg ordered today demonstrates normal sinus rhythm left axis deviation left bundle branch block 70 bpm  Recent Labs: 01/19/2022: TSH 3.060 02/05/2022: ALT <5 02/10/2022: BUN 51; Platelets 142; Potassium 4.4 02/12/2022: Hemoglobin 9.1 02/13/2022: Creatinine, Ser 1.53; Sodium 137  Recent Lipid Panel    Component Value Date/Time   CHOL 182 04/01/2020 0849   TRIG 92 04/01/2020 0849   HDL 89 04/01/2020 0849   CHOLHDL 2.0 04/01/2020 0849   CHOLHDL 1.9 09/30/2011 0415   VLDL 8 09/30/2011 0415   LDLCALC 77 04/01/2020 0849    ASSESSMENT &  PLAN    Paroxysmal atrial fibrillation-heart rate today 70  BPM. compliant with Coumadin, INR with PCP.  Has noticed scant blood on tissue paper with wiping.  No frank blood.  Recent admission for rectus sheath hematoma.   Received 3 units PRBCs.  INR therapeutic.  02/07/2022 H&H stable.  History of severe bradycardia with beta-blocker therapy.  (TSH, CXR, LFTs WDL).  Reports hemoglobin has increased to 10.1. This patients CHA2DS2-VASc Score and unadjusted Ischemic Stroke Rate (% per year) is equal to 4.8 % stroke rate/year from a score of 4 [CHF, HTN, 2Age > 75,  Continue amiodarone, Coumadin Heart healthy low-sodium diet Increase physical activity as tolerated Recommend annual eye exam  Chronic combined systolic and diastolic CHF-recently received 3 days of increased diuresis.  Weight prior to being admitted was 120 pounds.  Noted to have +2 pitting bilateral lower extremity edema.  Reports good urine output.  Weight today 129 pounds.  Intolerant of Entresto due to hypotension, intolerant of metoprolol due to bradycardia. Heart healthy low-sodium diet-salty 6 given Increase furosemide to 40 mg x 3 days, increase potassium to 40 mEq x 3 days We will repeat BMP at PCP-request labs  Contact office with a weight increase of 2 pounds overnight or 5 pounds in a week Elevate lower extremities when not active Lower extremity support stockings  Mechanical aortic valve-no increased DOE or activity intolerance.  Echocardiogram 06/20/2020 showed well-functioning aortic prosthesis.  Details above. Continue SBE prophylaxis  Essential hypertension-BP today 138/62.  Well-controlled at home. Continue metoprolol Heart healthy low-sodium diet-salty 6 given Increase physical activity as tolerated  Hyperlipidemia-LDL 76 on 06/23/21 Continue simvastatin Heart healthy low-sodium high-fiber diet Increase physical activity as tolerated Follows with PCP  Parkinson's-stable at this time. Continue Sinemet. Maintain physical activity as tolerated Follows with PCP     Disposition: Follow-up with Dr. Martinique or me in 1-2 months.  Medication Adjustments/Labs and Tests Ordered: Current medicines are reviewed at length with the  patient today.  Concerns regarding medicines are outlined above.  Orders Placed This Encounter  Procedures   EKG 12-Lead   No orders of the defined types were placed in this encounter.   Patient Instructions  Medication Instructions:  Your physician has recommended you make the following change in your medication:   Thursday 2/23: Please take an extra 10m lasix and 238m potassium   Friday and Saturday: Please take 4038mf lasix and 40 meq potassium each day   Sunday: return to normal dosing  *If you need a refill on your cardiac medications before your next appointment, please call your pharmacy*   Lab Work: Please have your PCP draw a BMP on Monday at your appointment!   If you have labs (blood work) drawn today and your tests are completely normal, you will receive your results only by: My[facility] you have MyChart) OR A paper copy in the mail If you have any lab test that is abnormal or we need to change your treatment, we will call you to review the results.   Testing/Procedures: None ordered today    Follow-Up: [facility] and your [facility] needs are our priority.  As part of our continuing mission to provide you with exceptional heart care, we have created designated Provider Care Teams.  These Care Teams include your primary Cardiologist (physician) and Advanced Practice Providers (APPs -  Physician Assistants and Nurse Practitioners) who all work together to provide you with the care you need, when you need it.  We recommend signing up for the patient portal called "MyChart".  Sign up information is provided on this After Visit Summary.  MyChart is used to connect with patients for Virtual Visits (Telemedicine).  Patients are able to view lab/test results, encounter notes, upcoming appointments, etc.  Non-urgent messages can be sent to your provider as well.   To learn more about what you can do with MyChart, go to [email or web address].    Your  next appointment:   2 month(s)  The format for your next appointment:   In Person  Provider:   Peter Martinique, MD  or Coletta Memos, NP {  Other Instructions Recommend weighing daily and keeping a log. Please call our office if you have weight gain of 2 pounds overnight or 5 pounds in 1 week.   Date  Time Weight                                            Tips to Measure your Blood Pressure Correctly  To determine whether you have hypertension, a medical professional will take a blood pressure reading. How you prepare for the test, the position of your arm, and other factors can change a blood pressure reading by 10% or more. That could be enough to hide high blood pressure, start you on a drug you don't really need, or lead your doctor to incorrectly adjust your medications.  National and international guidelines offer specific instructions for measuring blood pressure. If a doctor, nurse, or medical assistant isn't doing it right, don't hesitate to ask him or her to get with the guidelines.  Here's what you can do to ensure a correct reading:  Don't drink a caffeinated beverage or smoke during the 30 minutes before the test.  Sit quietly for five minutes before the test begins.  During the measurement, sit in a chair with your feet on the floor and your arm supported so your elbow is at about heart level.  The inflatable part of the cuff should completely cover at least 80% of your upper arm, and the cuff should be placed on bare skin, not over a shirt.  Don't talk during the measurement.  Have your blood pressure measured twice, with a brief break in between. If the readings are different by 5 points or more, have it done a third time.  In 2017, new guidelines from the [place], the [employer] of Cardiology, and nine other health organizations lowered the diagnosis of high blood pressure to 130/80 mm Hg or higher for all adults. The guidelines  also redefined the various blood pressure categories to now include normal, elevated, Stage 1 hypertension, Stage 2 hypertension, and hypertensive crisis (see "Blood pressure categories").  Blood pressure categories  Blood pressure category SYSTOLIC (upper number)  DIASTOLIC (lower number)  Normal Less than 120 mm Hg and Less than 80 mm Hg  Elevated 120-129 mm Hg and Less than 80 mm Hg  High blood pressure: Stage 1 hypertension 130-139 mm Hg or 80-89 mm Hg  High blood pressure: Stage 2 hypertension 140 mm Hg or higher or 90 mm Hg or higher  Hypertensive crisis (consult your doctor immediately) Higher than 180 mm Hg and/or Higher than 120 mm Hg  Source: American Heart Association and American Stroke Association. For more on getting your blood pressure under control, buy Controlling Your Blood Pressure, a Special Health  Report from [facility] ([facility]).   Blood Pressure Log   Date   Time  Blood Pressure  Position  Example: Nov 1 9 AM 124/78 sitting                                                          Signed, Deberah Pelton, NP  02/18/2022 8:50 AM      Notice: This dictation was prepared with Dragon dictation along with smaller phrase technology. Any transcriptional errors that result from this process are unintentional and may not be corrected upon review.  I spent 14 minutes examining this patient, reviewing medications, and using patient centered shared decision making involving her cardiac care.  Prior to her visit I spent greater than 20 minutes reviewing her past medical history,  medications, and prior cardiac tests.

## 2022-02-18 ENCOUNTER — Encounter (HOSPITAL_BASED_OUTPATIENT_CLINIC_OR_DEPARTMENT_OTHER): Payer: Self-pay | Admitting: General Practice

## 2022-02-18 ENCOUNTER — Telehealth (HOSPITAL_BASED_OUTPATIENT_CLINIC_OR_DEPARTMENT_OTHER): Payer: Self-pay | Admitting: General Practice

## 2022-02-18 ENCOUNTER — Ambulatory Visit (INDEPENDENT_AMBULATORY_CARE_PROVIDER_SITE_OTHER): Payer: PPO | Admitting: General Practice

## 2022-02-18 ENCOUNTER — Telehealth: Payer: Self-pay | Admitting: Cardiology

## 2022-02-18 ENCOUNTER — Other Ambulatory Visit: Payer: Self-pay

## 2022-02-18 VITALS — BP 138/62 | HR 70 | Ht 59.5 in | Wt 129.0 lb

## 2022-02-18 DIAGNOSIS — I480 Paroxysmal atrial fibrillation: Secondary | ICD-10-CM | POA: Diagnosis not present

## 2022-02-18 DIAGNOSIS — I5042 Chronic combined systolic (congestive) and diastolic (congestive) heart failure: Secondary | ICD-10-CM | POA: Diagnosis not present

## 2022-02-18 DIAGNOSIS — G218 Other secondary parkinsonism: Secondary | ICD-10-CM

## 2022-02-18 DIAGNOSIS — E7800 Pure hypercholesterolemia, unspecified: Secondary | ICD-10-CM

## 2022-02-18 DIAGNOSIS — Z952 Presence of prosthetic heart valve: Secondary | ICD-10-CM

## 2022-02-18 DIAGNOSIS — I10 Essential (primary) hypertension: Secondary | ICD-10-CM

## 2022-02-18 NOTE — Telephone Encounter (Signed)
Patient is wanting to know if she should purchase knee high or thigh high compression hose

## 2022-02-18 NOTE — Patient Instructions (Signed)
Medication Instructions:  Your physician has recommended you make the following change in your medication:   Thursday 2/23: Please take an extra 20mg  lasix and 40meq potassium   Friday and Saturday: Please take 40mg  of lasix and 40 meq potassium each day   Sunday: return to normal dosing  *If you need a refill on your cardiac medications before your next appointment, please call your pharmacy*   Lab Work: Please have your PCP draw a BMP on Monday at your appointment!   If you have labs (blood work) drawn today and your tests are completely normal, you will receive your results only by: [place] (if you have MyChart) OR A paper copy in the mail If you have any lab test that is abnormal or we need to change your treatment, we will call you to review the results.   Testing/Procedures: None ordered today    Follow-Up: At [facility], you and your health needs are our priority.  As part of our continuing mission to provide you with exceptional heart care, we have created designated Provider Care Teams.  These Care Teams include your primary Cardiologist (physician) and Advanced Practice Providers (APPs -  Physician Assistants and Nurse Practitioners) who all work together to provide you with the care you need, when you need it.  We recommend signing up for the patient portal called "MyChart".  Sign up information is provided on this After Visit Summary.  MyChart is used to connect with patients for Virtual Visits (Telemedicine).  Patients are able to view lab/test results, encounter notes, upcoming appointments, etc.  Non-urgent messages can be sent to your provider as well.   To learn more about what you can do with MyChart, go to [email or web address].    Your next appointment:   2 month(s)  The format for your next appointment:   In Person  Provider:   Peter Martinique, MD  or Coletta Memos, NP {  Other Instructions Recommend weighing daily and keeping a log. Please  call our office if you have weight gain of 2 pounds overnight or 5 pounds in 1 week.   Date  Time Weight                                            Tips to Measure your Blood Pressure Correctly  To determine whether you have hypertension, a medical professional will take a blood pressure reading. How you prepare for the test, the position of your arm, and other factors can change a blood pressure reading by 10% or more. That could be enough to hide high blood pressure, start you on a drug you don't really need, or lead your doctor to incorrectly adjust your medications.  National and international guidelines offer specific instructions for measuring blood pressure. If a doctor, nurse, or medical assistant isn't doing it right, don't hesitate to ask him or her to get with the guidelines.  Here's what you can do to ensure a correct reading:  Don't drink a caffeinated beverage or smoke during the 30 minutes before the test.  Sit quietly for five minutes before the test begins.  During the measurement, sit in a chair with your feet on the floor and your arm supported so your elbow is at about heart level.  The inflatable part of the cuff should completely cover at least 80% of your upper arm, and the  cuff should be placed on bare skin, not over a shirt.  Don't talk during the measurement.  Have your blood pressure measured twice, with a brief break in between. If the readings are different by 5 points or more, have it done a third time.  In 2017, new guidelines from the [place], the [employer] of Cardiology, and nine other health organizations lowered the diagnosis of high blood pressure to 130/80 mm Hg or higher for all adults. The guidelines also redefined the various blood pressure categories to now include normal, elevated, Stage 1 hypertension, Stage 2 hypertension, and hypertensive crisis (see "Blood pressure categories").  Blood pressure categories   Blood pressure category SYSTOLIC (upper number)  DIASTOLIC (lower number)  Normal Less than 120 mm Hg and Less than 80 mm Hg  Elevated 120-129 mm Hg and Less than 80 mm Hg  High blood pressure: Stage 1 hypertension 130-139 mm Hg or 80-89 mm Hg  High blood pressure: Stage 2 hypertension 140 mm Hg or higher or 90 mm Hg or higher  Hypertensive crisis (consult your doctor immediately) Higher than 180 mm Hg and/or Higher than 120 mm Hg  Source: American Heart Association and American Stroke Association. For more on getting your blood pressure under control, buy Controlling Your Blood Pressure, a Special Health Report from [facility].   Blood Pressure Log   Date   Time  Blood Pressure  Position  Example: Nov 1 9 AM 124/78 sitting

## 2022-02-18 NOTE — Telephone Encounter (Signed)
A user error has taken place: encounter opened in error, closed for administrative reasons.

## 2022-02-18 NOTE — Telephone Encounter (Signed)
Advised patient that she is okay to buy knee high compression hose!

## 2022-02-23 ENCOUNTER — Ambulatory Visit: Payer: PPO | Admitting: Dermatology

## 2022-02-23 DIAGNOSIS — N184 Chronic kidney disease, stage 4 (severe): Secondary | ICD-10-CM | POA: Diagnosis not present

## 2022-02-23 DIAGNOSIS — E876 Hypokalemia: Secondary | ICD-10-CM | POA: Diagnosis not present

## 2022-02-23 DIAGNOSIS — I10 Essential (primary) hypertension: Secondary | ICD-10-CM | POA: Diagnosis not present

## 2022-02-24 DIAGNOSIS — K5730 Diverticulosis of large intestine without perforation or abscess without bleeding: Secondary | ICD-10-CM | POA: Diagnosis not present

## 2022-02-24 DIAGNOSIS — I447 Left bundle-branch block, unspecified: Secondary | ICD-10-CM | POA: Diagnosis not present

## 2022-02-24 DIAGNOSIS — I1310 Hypertensive heart and chronic kidney disease without heart failure, with stage 1 through stage 4 chronic kidney disease, or unspecified chronic kidney disease: Secondary | ICD-10-CM | POA: Diagnosis not present

## 2022-02-24 DIAGNOSIS — I480 Paroxysmal atrial fibrillation: Secondary | ICD-10-CM | POA: Diagnosis not present

## 2022-02-24 DIAGNOSIS — G20 Parkinson's disease: Secondary | ICD-10-CM | POA: Diagnosis not present

## 2022-02-24 DIAGNOSIS — D631 Anemia in chronic kidney disease: Secondary | ICD-10-CM | POA: Diagnosis not present

## 2022-02-24 DIAGNOSIS — Z9181 History of falling: Secondary | ICD-10-CM | POA: Diagnosis not present

## 2022-02-24 DIAGNOSIS — Z87442 Personal history of urinary calculi: Secondary | ICD-10-CM | POA: Diagnosis not present

## 2022-02-24 DIAGNOSIS — I700 Atherosclerosis of aorta: Secondary | ICD-10-CM | POA: Diagnosis not present

## 2022-02-24 DIAGNOSIS — S301XXD Contusion of abdominal wall, subsequent encounter: Secondary | ICD-10-CM | POA: Diagnosis not present

## 2022-02-24 DIAGNOSIS — K449 Diaphragmatic hernia without obstruction or gangrene: Secondary | ICD-10-CM | POA: Diagnosis not present

## 2022-02-24 DIAGNOSIS — I2510 Atherosclerotic heart disease of native coronary artery without angina pectoris: Secondary | ICD-10-CM | POA: Diagnosis not present

## 2022-02-24 DIAGNOSIS — Z7901 Long term (current) use of anticoagulants: Secondary | ICD-10-CM | POA: Diagnosis not present

## 2022-02-24 DIAGNOSIS — N184 Chronic kidney disease, stage 4 (severe): Secondary | ICD-10-CM | POA: Diagnosis not present

## 2022-02-24 DIAGNOSIS — E7800 Pure hypercholesterolemia, unspecified: Secondary | ICD-10-CM | POA: Diagnosis not present

## 2022-02-24 DIAGNOSIS — Z87440 Personal history of urinary (tract) infections: Secondary | ICD-10-CM | POA: Diagnosis not present

## 2022-03-02 DIAGNOSIS — H353131 Nonexudative age-related macular degeneration, bilateral, early dry stage: Secondary | ICD-10-CM | POA: Diagnosis not present

## 2022-03-05 ENCOUNTER — Telehealth: Payer: Self-pay | Admitting: Cardiology

## 2022-03-05 NOTE — Telephone Encounter (Signed)
Returned call to husband, he states that physical therapist wants to know how much water pt should be drinking? Informed husband that Dr Lonna Duval are not in the office today but will return Monday. Husband states that this can wait until his return. Will forward message for further direction. ?

## 2022-03-05 NOTE — Telephone Encounter (Signed)
Pt spouse has a question about his wifes water.. states that physical therapist is needing this information... please advise  ?

## 2022-03-05 NOTE — Telephone Encounter (Signed)
LMTCB

## 2022-03-05 NOTE — Telephone Encounter (Signed)
Husband of patient returning call from RN  ?

## 2022-03-06 NOTE — Telephone Encounter (Signed)
She should maintain 2 liters of fluid intake daily ? ?[name] Martinique MD, [facility] ? ?

## 2022-03-10 NOTE — Telephone Encounter (Signed)
Spoke to patient Dr.Jordan's advice given. 

## 2022-03-11 ENCOUNTER — Other Ambulatory Visit: Payer: Self-pay

## 2022-03-11 ENCOUNTER — Encounter: Payer: Self-pay | Admitting: Neurology

## 2022-03-11 ENCOUNTER — Ambulatory Visit: Payer: PPO | Admitting: Neurology

## 2022-03-11 VITALS — BP 124/79 | HR 66 | Ht 60.0 in | Wt 117.6 lb

## 2022-03-11 DIAGNOSIS — G231 Progressive supranuclear ophthalmoplegia [Steele-Richardson-Olszewski]: Secondary | ICD-10-CM

## 2022-03-11 DIAGNOSIS — H532 Diplopia: Secondary | ICD-10-CM | POA: Diagnosis not present

## 2022-03-11 MED ORDER — CARBIDOPA-LEVODOPA 25-100 MG PO TABS: 2.0000 | ORAL_TABLET | Freq: Three times a day (TID) | ORAL | 1 refills | Status: DC

## 2022-03-11 NOTE — Progress Notes (Signed)
? ? ?Assessment/Plan:  ? ?1.  PSP ? -Patient understands differences between PSP and Parkinson's disease.  Understands that there is an increased risk of falls, aspiration as subsequent morbidity and mortality from this disease compared to Parkinson's disease.   ? -Do not want her walking at all.  She needs to use motorized wheelchair at all times, unless husband is directly holding her. ? -continue carbidopa/levodopa 25/100, 2 po tid ? -seeing Dr. Valetta Close - just saw him 2 weeks ago ? ? ?2.  Neck pain with cervical dystonia ? -This is actually improved with levodopa therapy. ? -Her last MRI was in 2017 with evidence of severe C2/C3 left facet arthropathy with joint effusion and bone marrow edema.  She received injections at the time and pain improved. ? ?3.  Cough, likely associated with reflux ? -Follows with primary care.  Is on Pepcid and PPI.  Last modified barium swallow was in June, 2019 and was normal.  She doesn't feel like she has dysphagia ? ?4.  A-fib ? -on coumadin.  Cardiologist said that Coumadin could not be stopped because she had a mechanical valve, but he did recommend lower target range of INR.  However, he said PCP is monitoring/managing that. ? ?Subjective:  ? ?[name] was seen today in follow up for PSP.  My previous records were reviewed prior to todays visit as well as outside records available to me.  This patient is accompanied in the office by her spouse who supplements the history.  I did talk to her cardiologist after last visit.  He said that her Coumadin could not be stopped because she had mechanical valve, but he did recommend lowering target range of INR, but said that the primary physician was managing it.  Patient was admitted last month for chest and abdominal pain.  She had a rectus sheath hematoma on admission (patient denied trauma) and they felt that it was possibly spontaneous in the setting of anticoagulation.  She was transfused because of symptomatic anemia.   She got 3 units PRBCs.  Her INR was 3.2 on admission.  Few falls since our last visit - last was last week and it was an "easy fall."  Husband states they are "improving habits."  States that PT/ST is currently coming to the house ordered by the hospital.  She is swallowing okay except for the K pill.   ? ?Current prescribed movement disorder medications: ?Carbidopa/levodopa 25/100, 2 tablets 3 times per day ? ? ? ?ALLERGIES:   ?Allergies  ?Allergen Reactions  ? Demerol [Meperidine] Shortness Of Breath and Swelling  ? Oxycodone Other (See Comments)  ?  Hallucinations   ? Adhesive [Tape] Other (See Comments)  ?  Redness and skin tears  ? Ivp Dye [Iodinated Contrast Media] Hives  ?  OK with benadryl pre-med ('50mg'$  one hour before receiving iodinated contrast agent)  ? Phenergan [Promethazine] Other (See Comments)  ?  Avoids due to reaction with current medications  ? ? ?CURRENT MEDICATIONS:  ?Outpatient Encounter Medications as of 03/11/2022  ?Medication Sig  ? amiodarone (PACERONE) 200 MG tablet Take 1 tablet by mouth once daily (Patient taking differently: Take 200 mg by mouth See admin instructions. Take '200mg'$  by mouth daily except for Sundays.)  ? calcium carbonate (TUMS - DOSED IN MG ELEMENTAL CALCIUM) 500 MG chewable tablet Chew 1 tablet by mouth daily as needed for indigestion or heartburn.   ? calcium citrate-vitamin D (CITRACAL+D) 315-200 MG-UNIT per tablet Take 1 tablet by mouth  3 (three) times daily.  ? carbidopa-levodopa (SINEMET IR) 25-100 MG tablet TAKE 2 TABLETS BY MOUTH THREE TIMES DAILY  ? Cholecalciferol (VITAMIN D3) 2000 units TABS Take 2,000 Units by mouth daily.   ? famotidine-calcium carbonate-magnesium hydroxide (PEPCID COMPLETE) 10-800-165 MG CHEW chewable tablet Chew 1 tablet by mouth daily as needed (heartburn).  ? fexofenadine (ALLEGRA) 60 MG tablet Take 60 mg by mouth at bedtime.  ? furosemide (LASIX) 20 MG tablet Take 20 mg by mouth daily.  ? melatonin 5 MG TABS Take 5 mg by mouth at  bedtime.  ? Multiple Vitamin (MULTIVITAMIN) tablet Take 1 tablet by mouth daily at 12 noon.  ? pantoprazole (PROTONIX) 40 MG tablet Take 40 mg by mouth daily.  ? polyethylene glycol (MIRALAX / GLYCOLAX) 17 g packet Take 17 g by mouth daily. (Patient taking differently: Take 17 g by mouth as needed for mild constipation or moderate constipation.)  ? potassium chloride SA (KLOR-CON M) 20 MEQ tablet Take 20 mEq by mouth daily.  ? Probiotic Product (PROBIOTIC-10 PO) Take 1 capsule by mouth daily.   ? simvastatin (ZOCOR) 20 MG tablet Take 20 mg by mouth every evening.  ? vitamin B-12 (CYANOCOBALAMIN) 1000 MCG tablet Take 1,000 mcg by mouth every morning.  ? warfarin (COUMADIN) 5 MG tablet Take 2.5-5 mg by mouth every evening. Patient is taking 2.'5mg'$  on Sunday, Tues, [place]. Sat. Patient is taking 5 mg on Monday, Wed, Friday.  ? metoprolol tartrate (LOPRESSOR) 25 MG tablet Take 1 tablet (25 mg total) by mouth daily as needed (if heart rate is above 110).  ? ?No facility-administered encounter medications on file as of 03/11/2022.  ? ? ?Objective:  ? ?PHYSICAL EXAMINATION:   ? ?VITALS:   ?Vitals:  ? 03/11/22 0805  ?BP: 124/79  ?Pulse: 66  ?SpO2: 99%  ?Weight: 117 lb 9.6 oz (53.3 kg)  ?Height: 5' (1.524 m)  ? ? ? ?GEN:  The patient appears stated age and is in NAD. ?HEENT:  Normocephalic, AT.   Pt wearing helmet.  The mucous membranes are moist. The superficial temporal arteries are without ropiness or tenderness.  She is wearing a soft helmet. ?CV:  RRR ?Lungs:  CTAB ?Neck/HEME:  There are no carotid bruits bilaterally. ? ?Neurological examination: ? ?Orientation: The patient is alert and oriented x3. ?Cranial nerves: There is good facial symmetry with facial hypomimia. There has upgaze and downgaze paresis.  She c/o diplopia with it.  The speech is fluent and clear. Soft palate rises symmetrically and there is no tongue deviation. Hearing is intact to conversational tone. ?Sensation: Sensation is intact to light touch  throughout ?Motor: Strength is at least antigravity x4. ? ?Movement examination: ?Tone: There is normal tone in the UE/LE ?Abnormal movements: none ?Coordination:  There is no decremation with RAM's, but she is slow ?Gait and Station: she pushes off to arise.  She drags the R leg and has some freezing in the doorway and turns. ? ?I have reviewed and interpreted the following labs independently ? ?  Chemistry   ?   ?Component Value Date/Time  ? NA 137 02/13/2022 0133  ? NA 138 01/19/2022 0944  ? K 4.4 02/10/2022 0425  ? CL 98 02/10/2022 0425  ? CO2 26 02/10/2022 0425  ? BUN 51 (H) 02/10/2022 0425  ? BUN 24 01/19/2022 0944  ? CREATININE 1.53 (H) 02/13/2022 0133  ?    ?Component Value Date/Time  ? CALCIUM 8.4 (L) 02/10/2022 0425  ? ALKPHOS 79 02/05/2022 0629  ?  AST 39 02/05/2022 0629  ? ALT <5 02/05/2022 0629  ? BILITOT 1.0 02/05/2022 0629  ? BILITOT 0.4 01/19/2022 0944  ?  ? ? ? ?Lab Results  ?Component Value Date  ? WBC 7.5 02/10/2022  ? HGB 9.1 (L) 02/12/2022  ? HCT 28.1 (L) 02/12/2022  ? MCV 91.0 02/10/2022  ? PLT 142 (L) 02/10/2022  ? ? ?Lab Results  ?Component Value Date  ? TSH 3.060 01/19/2022  ? ? ? ?Total time spent on today's visit was 30 minutes, including both face-to-face time and nonface-to-face time.  Time included that spent on review of records (prior notes available to me/labs/imaging if pertinent), discussing treatment and goals, answering patient's questions and coordinating care. ? ?Cc:  Donnajean Lopes, MD ?

## 2022-03-16 ENCOUNTER — Telehealth: Payer: Self-pay | Admitting: Neurology

## 2022-03-16 DIAGNOSIS — I447 Left bundle-branch block, unspecified: Secondary | ICD-10-CM | POA: Diagnosis not present

## 2022-03-16 DIAGNOSIS — K449 Diaphragmatic hernia without obstruction or gangrene: Secondary | ICD-10-CM | POA: Diagnosis not present

## 2022-03-16 DIAGNOSIS — I480 Paroxysmal atrial fibrillation: Secondary | ICD-10-CM | POA: Diagnosis not present

## 2022-03-16 DIAGNOSIS — D631 Anemia in chronic kidney disease: Secondary | ICD-10-CM | POA: Diagnosis not present

## 2022-03-16 DIAGNOSIS — N184 Chronic kidney disease, stage 4 (severe): Secondary | ICD-10-CM | POA: Diagnosis not present

## 2022-03-16 DIAGNOSIS — Z87442 Personal history of urinary calculi: Secondary | ICD-10-CM | POA: Diagnosis not present

## 2022-03-16 DIAGNOSIS — I1310 Hypertensive heart and chronic kidney disease without heart failure, with stage 1 through stage 4 chronic kidney disease, or unspecified chronic kidney disease: Secondary | ICD-10-CM | POA: Diagnosis not present

## 2022-03-16 DIAGNOSIS — S301XXD Contusion of abdominal wall, subsequent encounter: Secondary | ICD-10-CM | POA: Diagnosis not present

## 2022-03-16 DIAGNOSIS — I2510 Atherosclerotic heart disease of native coronary artery without angina pectoris: Secondary | ICD-10-CM | POA: Diagnosis not present

## 2022-03-16 DIAGNOSIS — G20 Parkinson's disease: Secondary | ICD-10-CM | POA: Diagnosis not present

## 2022-03-16 DIAGNOSIS — I700 Atherosclerosis of aorta: Secondary | ICD-10-CM | POA: Diagnosis not present

## 2022-03-16 DIAGNOSIS — K5730 Diverticulosis of large intestine without perforation or abscess without bleeding: Secondary | ICD-10-CM | POA: Diagnosis not present

## 2022-03-16 DIAGNOSIS — Z9181 History of falling: Secondary | ICD-10-CM | POA: Diagnosis not present

## 2022-03-16 DIAGNOSIS — Z7901 Long term (current) use of anticoagulants: Secondary | ICD-10-CM | POA: Diagnosis not present

## 2022-03-16 DIAGNOSIS — E7800 Pure hypercholesterolemia, unspecified: Secondary | ICD-10-CM | POA: Diagnosis not present

## 2022-03-16 DIAGNOSIS — Z87440 Personal history of urinary (tract) infections: Secondary | ICD-10-CM | POA: Diagnosis not present

## 2022-03-16 NOTE — Telephone Encounter (Signed)
Left message with the after hour service on 03-16-22 at 12:59 pm  ? ? ?Caller states that he needs to leave a note for Tat, patient was discharged today. Patient fell Friday  reinforce safety precaution  ? ?After hour service note is in [place] ?

## 2022-03-19 ENCOUNTER — Ambulatory Visit: Payer: PPO | Admitting: Neurology

## 2022-03-25 DIAGNOSIS — Z7901 Long term (current) use of anticoagulants: Secondary | ICD-10-CM | POA: Diagnosis not present

## 2022-03-25 DIAGNOSIS — Z952 Presence of prosthetic heart valve: Secondary | ICD-10-CM | POA: Diagnosis not present

## 2022-03-25 DIAGNOSIS — I480 Paroxysmal atrial fibrillation: Secondary | ICD-10-CM | POA: Diagnosis not present

## 2022-04-01 ENCOUNTER — Ambulatory Visit: Payer: PPO | Admitting: Dermatology

## 2022-04-06 ENCOUNTER — Ambulatory Visit: Payer: PPO | Admitting: Dermatology

## 2022-04-06 DIAGNOSIS — D485 Neoplasm of uncertain behavior of skin: Secondary | ICD-10-CM

## 2022-04-06 DIAGNOSIS — C44321 Squamous cell carcinoma of skin of nose: Secondary | ICD-10-CM

## 2022-04-06 DIAGNOSIS — D2239 Melanocytic nevi of other parts of face: Secondary | ICD-10-CM | POA: Diagnosis not present

## 2022-04-06 DIAGNOSIS — D229 Melanocytic nevi, unspecified: Secondary | ICD-10-CM

## 2022-04-06 DIAGNOSIS — C4492 Squamous cell carcinoma of skin, unspecified: Secondary | ICD-10-CM

## 2022-04-06 HISTORY — DX: Squamous cell carcinoma of skin, unspecified: C44.92

## 2022-04-06 NOTE — Patient Instructions (Addendum)
Biopsy, Surgery (Curettage) & Surgery (Excision) Aftercare Instructions ? ?1. Okay to remove bandage in 24 hours ? ?2. Wash area with soap and water ? ?3. Apply Vaseline to area twice daily until healed (Not Neosporin) ? ?4. Okay to cover with a Band-Aid to decrease the chance of infection or prevent irritation from clothing; also it's okay to uncover lesion at home. ? ?5. Suture instructions: return to our office in 7-10 or 10-14 days for a nurse visit for suture removal. Variable healing with sutures, if pain or itching occurs call our office. It's okay to shower or bathe 24 hours after sutures are given. ? ?6. The following risks may occur after a biopsy, curettage or excision: bleeding, scarring, discoloration, recurrence, infection (redness, yellow drainage, pain or swelling). ? ?7. For questions, concerns and results call our office at [facility] ?

## 2022-04-13 ENCOUNTER — Telehealth: Payer: Self-pay | Admitting: *Deleted

## 2022-04-13 ENCOUNTER — Encounter: Payer: Self-pay | Admitting: *Deleted

## 2022-04-13 NOTE — Telephone Encounter (Signed)
Path to patient. Made surgical appointment with Dr. Denna Haggard to discuss options for treatment.  ?

## 2022-04-13 NOTE — Progress Notes (Addendum)
? ?Cardiology Office Note:   ? ?Date:  04/15/2022  ? ?ID:  [name], DOB 09/07/1941, MRN [id number] ? ?PCP:  Donnajean Lopes, MD ?  ?[facility] Providers ?Cardiologist:  Peter Martinique, MD  ?   ? ?Referring MD: Donnajean Lopes, MD  ? ?Follow-up for atrial fibrillation and hypertension. ? ?History of Present Illness:   ? ?[name] is a 81 y.o. female with a hx of HTN, atrial fibrillation, PVCs, sinus bradycardia, chronic combined systolic and diastolic CHF, GERD, progressive supranuclear plasty, CKD stage III, status post aortic valve replacement, Parkinson's disease, and history of mastectomy. ? ?She was seen by Dr. Martinique 01/19/2022.  During that time she reported that she had a recent bout of bronchitis.  She was treated with antibiotics and recovered well.  She noted that she was going to need to have a dental extraction.  She was provided with SBE prophylaxis.  She denied chest pain, dyspnea, lower extremity swelling, palpitations.  Her blood pressure at home has been well controlled.  She did report some falls at home.  She reported compliance with her walker and indicated that she was being very careful. ? ?She presented to the emergency department 02/05/2022 and was discharged on 02/13/2022.  She presented with 2-3 days of chest and abdominal pain.  She reported compliance with her Coumadin.  Due to her Parkinson's it was noted that she struggled to get in and out of bed.  She was noted to have chronic frequent urination.  She is noted to have spontaneous rectus sheath hematoma.  Surgery was consulted and reported that no intervention was necessary.  She received PRBCs for decreased H&H.  Her INR was noted to be subtherapeutic and had previously been chronically stable.  Her Coumadin was resumed.  Her INR returned to therapeutic range and her H&H remained stable.  She was encouraged to follow-up with cardiology for dose adjustments with there are no goals. ? ?She presented to the  clinic  02/18/22 for follow-up evaluation and stated her weight prior to being hospitalized was 120 pounds.  She continued to lose weight slowly.  Her weight today was 129 pounds.  She followed up with her PCP on Monday.  During that time her furosemide was increased to 40 mg x 3 days.  She reported that her hemoglobin continued to improve and was 10.1 at that visit.  She was slowly increasing her physical activity but noted shortness of breath with increased physical activity.  Her blood pressure at home had been 100s-1 teens over 80s-60s.  We reviewed her recent hospitalization.  I increased her furosemide to 40 mg x 3 days and increase her potassium to 40 mill equivalents x3 days.  Her and her husband reported that they would return to her PCP on the following Monday for follow-up.  I requested that they have a BMP repeated and send results to [place].  I gave the salty 6 diet sheet, had her continue to weigh herself daily, continue daily blood pressures, and plan follow-up for 2 months. ? ?She presents to the clinic today for follow-up evaluation, with her husband, and states she feels well.  Her weight is down to 119.2 pounds.  She is back to all of her normal daily activities.  She reports compliance with her.  She denies bleeding issues.  We reviewed the importance of daily weights.  She had recent lab work done at her PCP.  I will request her recent BMP.  Continue her  current medication regimen.  Continue weight log, continue with current low-sodium diet.  We will plan follow-up for 4 to 6 months. ? ?Today she denies chest pain, increased shortness of breath, lower extremity edema, fatigue, palpitations, melena, hematuria, hemoptysis, diaphoresis, weakness, presyncope, syncope, orthopnea, and PND. ? ? ?Past Medical History:  ?Diagnosis Date  ? BPV (benign positional vertigo)   ? Chronic neck pain   ? C2-3 arthropathy  ? CKD (chronic kidney disease), stage II   ? Diverticulosis of colon   ? GERD (gastroesophageal  reflux disease)   ? Hiatal hernia   ? History of breast cancer per pt no recurrence  ? dx 2004-- Left breast DCIS (ER/PR+, HER2 negative) s/p  partial masectomy w/ sln bx and  chemoradiotion (completed 2004)  ? History of herpes zoster   ? History of kidney stones   ? HTN (hypertension)   ? Hyperlipidemia   ? Irritable bowel syndrome   ? LBBB (left bundle branch block)   ? PAF (paroxysmal atrial fibrillation) ([place])   ? a. CHA2DS2VASc = 4-->coumadin.  ? Parkinsonism ([place])   ? neurologist-  dr tat  ? PONV (postoperative nausea and vomiting)   ? S/P aortic valve replacement with prosthetic valve   ? a. 04/13/2006 s/p St Jude mechnical AVR for severe AS;  b. 09/2014 Echo: EF 40-45%, gr1 DD, mild AI/MR, triv TR/PR.  ? Squamous cell carcinoma of skin 04/06/2022  ? right nasal sidewall  ? SUI (stress urinary incontinence, female)   ? Symptomatic anemia   ? Venous varices    ? ? ?Past Surgical History:  ?Procedure Laterality Date  ? ANTERIOR AND POSTERIOR REPAIR  1990  ? cystocele/ rectocele  ? AORTIC VALVE REPLACEMENT  04/13/06   dr gerhardt  ? and Replacement Ascending Aorta (St. Jude mechanical prosthesis)  ? APPENDECTOMY  child 1950  ? BREAST EXCISIONAL BIOPSY Left   ? BREAST LUMPECTOMY Left 01/11/2003  ? malignant  ? BREAST SURGERY Left 2004  ? CARDIAC CATHETERIZATION  02-22-2001   dr Martinique  ? minimal non-obstructive cad/  mild aortic stenosis and aortic root enlargement/  normal LVF, ef 65%  ? CARDIAC CATHETERIZATION  03-22-2006    dr Martinique  ? no significant obstructive cad/  severe aortic stenosis and mild to moderate aortic root enlargement/  upper normal right heart pressures  ? CARDIOVASCULAR STRESS TEST  09-30-2011   dr Martinique  ? lexiscan nuclear study w/ apical thinning  vs  small prior apical infarct w/ no significant ischemia/  hypokinesis of the distal septum and apex, ef 50%  ? CARPAL TUNNEL RELEASE Bilateral left 09-09-2004/  right 2007  ? CATARACT EXTRACTION W/ INTRAOCULAR LENS  IMPLANT, BILATERAL  2015  ?  COLONOSCOPY N/A 10/03/2014  ? Procedure: COLONOSCOPY;  Surgeon: [name], MD;  Location: [facility];  Service: Endoscopy;  Laterality: N/A;  ? CYSTOSCOPY WITH RETROGRADE PYELOGRAM, URETEROSCOPY AND STENT PLACEMENT Right 09/17/2016  ? Procedure: CYSTOSCOPY WITH RIGHT RETROGRADE PYELOGRAM, RIGHT URETEROSCOPY AND STENT PLACEMENT LASER LITHOTRIPSY, RIGHT STENT PLACEMENT;  Surgeon: [name], MD;  [facility];  Service: Urology;  Laterality: Right;  ? ESOPHAGEAL MANOMETRY N/A 08/12/2014  ? Procedure: ESOPHAGEAL MANOMETRY (EM);  Surgeon: [name], MD;  Location: [facility];  Service: Endoscopy;  Laterality: N/A;  ? ESOPHAGOGASTRODUODENOSCOPY N/A 10/03/2014  ? Procedure: ESOPHAGOGASTRODUODENOSCOPY (EGD);  Surgeon: [name], MD;  [facility];  Service: Endoscopy;  Laterality: N/A;  ? ESOPHAGOGASTRODUODENOSCOPY N/A 12/08/2018  ? Procedure:  ESOPHAGOGASTRODUODENOSCOPY (EGD);  Surgeon: Doran Stabler, MD;  Location: [facility];  Service: Gastroenterology;  Laterality: N/A;  ? [place]  ? FOOT SURGERY Right 2013  ? hammertoe x2  ? HOLMIUM LASER APPLICATION Right 02/22/7374  ? Procedure: HOLMIUM LASER APPLICATION;  Surgeon: [name], MD;  [facility];  Service: Urology;  Laterality: Right;  ? PERCUTANEOUS NEPHROSTOLITHOTOMY  1993  ? STONE EXTRACTION WITH BASKET Right 09/17/2016  ? Procedure: STONE EXTRACTION WITH BASKET;  Surgeon: [name], MD;  Location: [facility];  Service: Urology;  Laterality: Right;  ? TOTAL ABDOMINAL HYSTERECTOMY  1971  ? w/ Bilateral Salpingoophorectomy  ? TRANSTHORACIC ECHOCARDIOGRAM  10/24/2014   dr Martinique  ? hypokinesis of the basal and mid inferoseptal and inferior walls,  ef 40-45%,  grade 1 diastolic dysfunction/  mechanical bileaflet aortic valve noted w/ mild regur., peak grandient 10mHg, valve area 1.35cm^2/  severe MV calcification  without stenosis w/ mild regurg., peak gradient 315mg/  mild LAE/  poss. atrial mass (MRI showed benign lipomatous hypertrophy of atrial septum) /tFrazier ButtR/mild TR  ? ? ?Current Medications: ?Current

## 2022-04-13 NOTE — Telephone Encounter (Signed)
-----   Message from Lavonna Monarch, MD sent at 04/13/2022  6:50 AM EDT ----- ?Please let family know that this is a type of skin cancer which is prone to local recurrence but almost never spreads beyond the skin.  Although the best way to cure this would be with Mohs surgery, this is potentially very difficult with her neurological issues.  I would schedule her as surgical appointment with me but we will discuss the neck step at that time. ?

## 2022-04-15 ENCOUNTER — Ambulatory Visit (INDEPENDENT_AMBULATORY_CARE_PROVIDER_SITE_OTHER): Payer: PPO | Admitting: General Practice

## 2022-04-15 ENCOUNTER — Encounter: Payer: Self-pay | Admitting: General Practice

## 2022-04-15 VITALS — BP 134/66 | HR 57 | Ht 59.75 in | Wt 119.2 lb

## 2022-04-15 DIAGNOSIS — E7800 Pure hypercholesterolemia, unspecified: Secondary | ICD-10-CM

## 2022-04-15 DIAGNOSIS — Z952 Presence of prosthetic heart valve: Secondary | ICD-10-CM

## 2022-04-15 DIAGNOSIS — I10 Essential (primary) hypertension: Secondary | ICD-10-CM | POA: Diagnosis not present

## 2022-04-15 DIAGNOSIS — I480 Paroxysmal atrial fibrillation: Secondary | ICD-10-CM

## 2022-04-15 DIAGNOSIS — I5042 Chronic combined systolic (congestive) and diastolic (congestive) heart failure: Secondary | ICD-10-CM | POA: Diagnosis not present

## 2022-04-15 DIAGNOSIS — G218 Other secondary parkinsonism: Secondary | ICD-10-CM | POA: Diagnosis not present

## 2022-04-15 NOTE — Patient Instructions (Addendum)
Medication Instructions:  ?The current medical regimen is effective;  continue present plan and medications as directed. Please refer to the Current Medication list given to you today.  ? ?*If you need a refill on your cardiac medications before your next appointment, please call your pharmacy* ? ?Lab Work:   Testing/Procedures:  ?NONE    NONE ? ?Special Instructions ?PLEASE READ AND FOLLOW SALTY 6-ATTACHED-1,'800mg'$  daily ? ?PLEASE MAINTAIN PHYSICAL ACTIVITY AS TOLERATED  ? ?Follow-Up: ?Your next appointment:  4-6 month(s) In Person with Peter Martinique, MD  or Coletta Memos, FNP      ? ?At [facility], you and your health needs are our priority.  As part of our continuing mission to provide you with exceptional heart care, we have created designated Provider Care Teams.  These Care Teams include your primary Cardiologist (physician) and Advanced Practice Providers (APPs -  Physician Assistants and Nurse Practitioners) who all work together to provide you with the care you need, when you need it. ? ? ?Important Information About Sugar ? ? ? ? ? ? ?        6 SALTY THINGS TO AVOID     1,'800MG'$  DAILY ? ? ? ? ? ? ?

## 2022-04-16 DIAGNOSIS — I480 Paroxysmal atrial fibrillation: Secondary | ICD-10-CM | POA: Diagnosis not present

## 2022-04-19 ENCOUNTER — Other Ambulatory Visit: Payer: Self-pay

## 2022-04-19 ENCOUNTER — Other Ambulatory Visit: Payer: Self-pay | Admitting: Internal Medicine

## 2022-04-19 MED ORDER — AMIODARONE HCL 200 MG PO TABS: ORAL_TABLET | ORAL | 3 refills | Status: DC

## 2022-04-20 ENCOUNTER — Other Ambulatory Visit: Payer: Self-pay

## 2022-04-22 DIAGNOSIS — I480 Paroxysmal atrial fibrillation: Secondary | ICD-10-CM | POA: Diagnosis not present

## 2022-04-22 DIAGNOSIS — Z7901 Long term (current) use of anticoagulants: Secondary | ICD-10-CM | POA: Diagnosis not present

## 2022-04-22 DIAGNOSIS — Z952 Presence of prosthetic heart valve: Secondary | ICD-10-CM | POA: Diagnosis not present

## 2022-04-25 ENCOUNTER — Encounter: Payer: Self-pay | Admitting: Dermatology

## 2022-04-25 NOTE — Progress Notes (Signed)
? ?  New Patient ?  ?Subjective  ?[name] is a 81 y.o. female who presents for the following: Skin Problem (Here to check lesion on nose. X 1 month. Also has something on inside of nose that itches. ). ? ?Growth on nose for 1 month ?Location:  ?Duration:  ?Quality:  ?Associated Signs/Symptoms: ?Modifying Factors:  ?Severity:  ?Timing: ?Context:  ? ? ?The following portions of the chart were reviewed this encounter and updated as appropriate:   ?  ? ?Objective  ?Well appearing patient in no apparent distress; mood and affect are within normal limits. ?Right Nasal Sidewall ?Volcano like 6 mm papule compatible with KA type SCCA ? ? ? ? ? ? ?Right Parotid Area ?5 mm dome-shaped flesh-colored papule right posterior cheek; no dermoscopic atypia ? ? ? ?A focused examination was performed including head and neck. Relevant physical exam findings are noted in the Assessment and Plan. ? ? ?Assessment & Plan  ?Neoplasm of uncertain behavior of skin ?Right Nasal Sidewall ? ?Skin / nail biopsy ?Type of biopsy: tangential   ?Informed consent: discussed and consent obtained   ?Timeout: patient name, date of birth, surgical site, and procedure verified   ?Anesthesia: the lesion was anesthetized in a standard fashion   ?Anesthetic:  1% lidocaine w/ epinephrine 1-100,000 local infiltration ?Instrument used: flexible razor blade   ?Hemostasis achieved with: ferric subsulfate   ?Outcome: patient tolerated procedure well   ?Post-procedure details: wound care instructions given   ? ?Specimen 1 - Surgical pathology ?Differential Diagnosis: KA ? ?Check Margins: No ? ?Nevus ?Right Parotid Area ? ?No intervention currently indicated ? ? ?

## 2022-05-20 DIAGNOSIS — Z952 Presence of prosthetic heart valve: Secondary | ICD-10-CM | POA: Diagnosis not present

## 2022-05-20 DIAGNOSIS — Z7901 Long term (current) use of anticoagulants: Secondary | ICD-10-CM | POA: Diagnosis not present

## 2022-05-20 DIAGNOSIS — I480 Paroxysmal atrial fibrillation: Secondary | ICD-10-CM | POA: Diagnosis not present

## 2022-06-03 ENCOUNTER — Ambulatory Visit (INDEPENDENT_AMBULATORY_CARE_PROVIDER_SITE_OTHER): Payer: PPO | Admitting: Dermatology

## 2022-06-03 DIAGNOSIS — C4492 Squamous cell carcinoma of skin, unspecified: Secondary | ICD-10-CM

## 2022-06-03 DIAGNOSIS — C44321 Squamous cell carcinoma of skin of nose: Secondary | ICD-10-CM

## 2022-06-03 NOTE — Patient Instructions (Signed)

## 2022-06-10 ENCOUNTER — Ambulatory Visit (INDEPENDENT_AMBULATORY_CARE_PROVIDER_SITE_OTHER): Payer: PPO

## 2022-06-10 DIAGNOSIS — Z4802 Encounter for removal of sutures: Secondary | ICD-10-CM

## 2022-06-10 NOTE — Progress Notes (Signed)
NTS Suture removal, No s/s of infection.

## 2022-06-24 DIAGNOSIS — Z7901 Long term (current) use of anticoagulants: Secondary | ICD-10-CM | POA: Diagnosis not present

## 2022-06-24 DIAGNOSIS — Z952 Presence of prosthetic heart valve: Secondary | ICD-10-CM | POA: Diagnosis not present

## 2022-06-24 DIAGNOSIS — I480 Paroxysmal atrial fibrillation: Secondary | ICD-10-CM | POA: Diagnosis not present

## 2022-06-27 ENCOUNTER — Encounter: Payer: Self-pay | Admitting: Dermatology

## 2022-06-27 NOTE — Progress Notes (Signed)
   Follow-Up Visit   Subjective  [name] is a 81 y.o. female who presents for the following: Procedure (Treatment KA on right nasal sidewall. ).  Squamous cell carcinoma/KA on nose Location:  Duration:  Quality:  Associated Signs/Symptoms: Modifying Factors:  Severity:  Timing: Context:   Objective  Well appearing patient in no apparent distress; mood and affect are within normal limits. Right Nasal Sidewall Biopsy site found by patient, nurse, and me.    A focused examination was performed including the head and neck. Relevant physical exam findings are noted in the Assessment and Plan.   Assessment & Plan    Squamous cell carcinoma of skin Right Nasal Sidewall  Skin excision  Lesion length (cm):  0.7 Lesion width (cm):  0.7 Margin per side (cm):  0.1 Total excision diameter (cm):  0.9 Informed consent: discussed and consent obtained   Anesthesia: the lesion was anesthetized in a standard fashion   Anesthetic:  1% lidocaine w/ epinephrine 1-100,000 local infiltration Instrument used: #15 blade   Hemostasis achieved with: ferric subsulfate   Outcome: patient tolerated procedure well with no complications   Post-procedure details: wound care instructions given   Additional details:  5-0 nylon x 2   Destruction of lesion Complexity: simple   Destruction method: electrodesiccation and curettage   Informed consent: discussed and consent obtained   Timeout:  patient name, date of birth, surgical site, and procedure verified Anesthesia: the lesion was anesthetized in a standard fashion   Anesthetic:  1% lidocaine w/ epinephrine 1-100,000 local infiltration Curettage performed in three different directions: Yes   Curettage cycles:  3 Final wound size (cm):  1 Hemostasis achieved with:  aluminum chloride Outcome: patient tolerated procedure well with no complications   Post-procedure details: wound care instructions given    Showed this to be a  fairly deep more than wide lesion, sore after cautery was done narrow margin excision and simple closure performed.  Specimen fragmented so no second microscopic sent.      I, Lavonna Monarch, MD, have reviewed all documentation for this visit.  The documentation on 06/27/22 for the exam, diagnosis, procedures, and orders are all accurate and complete.

## 2022-07-01 ENCOUNTER — Emergency Department (HOSPITAL_COMMUNITY): Payer: PPO

## 2022-07-01 ENCOUNTER — Emergency Department (HOSPITAL_COMMUNITY)
Admission: EM | Admit: 2022-07-01 | Discharge: 2022-07-01 | Disposition: A | Discharge status: Home / Self Care | Payer: PPO | Attending: Emergency Medicine | Admitting: Emergency Medicine

## 2022-07-01 ENCOUNTER — Other Ambulatory Visit: Payer: Self-pay | Admitting: Internal Medicine

## 2022-07-01 DIAGNOSIS — S3993XA Unspecified injury of pelvis, initial encounter: Secondary | ICD-10-CM | POA: Diagnosis not present

## 2022-07-01 DIAGNOSIS — W01198A Fall on same level from slipping, tripping and stumbling with subsequent striking against other object, initial encounter: Secondary | ICD-10-CM | POA: Insufficient documentation

## 2022-07-01 DIAGNOSIS — S199XXA Unspecified injury of neck, initial encounter: Secondary | ICD-10-CM | POA: Diagnosis not present

## 2022-07-01 DIAGNOSIS — Z7901 Long term (current) use of anticoagulants: Secondary | ICD-10-CM | POA: Diagnosis not present

## 2022-07-01 DIAGNOSIS — I10 Essential (primary) hypertension: Secondary | ICD-10-CM | POA: Diagnosis not present

## 2022-07-01 DIAGNOSIS — Z85828 Personal history of other malignant neoplasm of skin: Secondary | ICD-10-CM | POA: Diagnosis not present

## 2022-07-01 DIAGNOSIS — S0003XA Contusion of scalp, initial encounter: Secondary | ICD-10-CM | POA: Insufficient documentation

## 2022-07-01 DIAGNOSIS — I700 Atherosclerosis of aorta: Secondary | ICD-10-CM | POA: Diagnosis not present

## 2022-07-01 DIAGNOSIS — T1490XA Injury, unspecified, initial encounter: Secondary | ICD-10-CM | POA: Diagnosis not present

## 2022-07-01 DIAGNOSIS — Z1231 Encounter for screening mammogram for malignant neoplasm of breast: Secondary | ICD-10-CM

## 2022-07-01 DIAGNOSIS — Z79899 Other long term (current) drug therapy: Secondary | ICD-10-CM | POA: Insufficient documentation

## 2022-07-01 LAB — CBC
HCT: 37.3 % (ref 36.0–46.0)
Hemoglobin: 12.3 g/dL (ref 12.0–15.0)
MCH: 34.4 pg — ABNORMAL HIGH (ref 26.0–34.0)
MCHC: 33 g/dL (ref 30.0–36.0)
MCV: 104.2 fL — ABNORMAL HIGH (ref 80.0–100.0)
Platelets: 178 10*3/uL (ref 150–400)
RBC: 3.58 MIL/uL — ABNORMAL LOW (ref 3.87–5.11)
RDW: 13.3 % (ref 11.5–15.5)
WBC: 6 10*3/uL (ref 4.0–10.5)
nRBC: 0 % (ref 0.0–0.2)

## 2022-07-01 LAB — COMPREHENSIVE METABOLIC PANEL
ALT: <5 U/L (ref 0–44)
AST: 25 U/L (ref 15–41)
Albumin: 3.6 g/dL (ref 3.5–5.0)
Alkaline Phosphatase: 83 U/L (ref 38–126)
Anion gap: 13 (ref 5–15)
BUN: 24 mg/dL — ABNORMAL HIGH (ref 8–23)
CO2: 27 mmol/L (ref 22–32)
Calcium: 9.7 mg/dL (ref 8.9–10.3)
Chloride: 100 mmol/L (ref 98–111)
Creatinine, Ser: 1.8 mg/dL — ABNORMAL HIGH (ref 0.44–1.00)
GFR, Estimated: 28 mL/min — ABNORMAL LOW (ref >60)
Glucose, Bld: 99 mg/dL (ref 70–99)
Potassium: 4.3 mmol/L (ref 3.5–5.1)
Sodium: 140 mmol/L (ref 135–145)
Total Bilirubin: 0.9 mg/dL (ref 0.3–1.2)
Total Protein: 6.4 g/dL — ABNORMAL LOW (ref 6.5–8.1)

## 2022-07-01 LAB — PROTIME-INR
INR: 3.2 — ABNORMAL HIGH (ref 0.8–1.2)
Prothrombin Time: 32.6 seconds — ABNORMAL HIGH (ref 11.4–15.2)

## 2022-07-01 NOTE — ED Provider Notes (Signed)
[facility] EMERGENCY DEPARTMENT Provider Note   CSN: [id number] Arrival date & time: 07/01/22  1817     History  Chief Complaint  Patient presents with   [name] [name] is a 81 y.o. female.  Pt is an 81 yo female with a pmhx significant for htn, gerd, ibs, hld, brca, kidney stones, afib (on coumadin), skin cancer and s/p AV repair (mechanical valve).  Pt said she stumbled and fell backwards while going to the restroom.  She did hit the back of her head.  She denies any other injury.         Home Medications Prior to Admission medications   Medication Sig Start Date End Date Taking? Authorizing Provider  amiodarone (PACERONE) 200 MG tablet Take 1 tablet by mouth once daily 04/19/22   Deberah Pelton, NP  calcium carbonate (TUMS - DOSED IN MG ELEMENTAL CALCIUM) 500 MG chewable tablet Chew 1 tablet by mouth daily as needed for indigestion or heartburn.     [provider]  calcium citrate-vitamin D (CITRACAL+D) 315-200 MG-UNIT per tablet Take 1 tablet by mouth 3 (three) times daily.    [provider]  carbidopa-levodopa (SINEMET IR) 25-100 MG tablet Take 2 tablets by mouth 3 (three) times daily. 03/11/22   Tat, Eustace Quail, DO  Cholecalciferol (VITAMIN D3) 2000 units TABS Take 2,000 Units by mouth daily.     [provider]  famotidine-calcium carbonate-magnesium hydroxide (PEPCID COMPLETE) 10-800-165 MG CHEW chewable tablet Chew 1 tablet by mouth daily as needed (heartburn).    [provider]  fexofenadine (ALLEGRA) 60 MG tablet Take 60 mg by mouth at bedtime.    [provider]  furosemide (LASIX) 20 MG tablet Take 1 tablet by mouth once daily 04/21/22   Martinique, Peter M, MD  melatonin 5 MG TABS Take 5 mg by mouth at bedtime.    [provider]  metoprolol tartrate (LOPRESSOR) 25 MG tablet Take 1 tablet (25 mg total) by mouth daily as needed (if heart rate is above 110). 11/13/20 02/18/22  Martinique,  Peter M, MD  Multiple Vitamin (MULTIVITAMIN) tablet Take 1 tablet by mouth daily at 12 noon.    [provider]  pantoprazole (PROTONIX) 40 MG tablet Take 40 mg by mouth daily. 06/07/17   [provider]  potassium chloride SA (KLOR-CON M) 20 MEQ tablet Take 20 mEq by mouth daily.    [provider]  Probiotic Product (PROBIOTIC-10 PO) Take 1 capsule by mouth daily.     [provider]  simvastatin (ZOCOR) 20 MG tablet Take 20 mg by mouth every evening.    [provider]  vitamin B-12 (CYANOCOBALAMIN) 1000 MCG tablet Take 1,000 mcg by mouth every morning.    [provider]  warfarin (COUMADIN) 5 MG tablet Take 2.5-5 mg by mouth every evening. Patient is taking 2.34m on Sunday, Tues, [place] Sat. Patient is taking 5 mg on Monday, Wed, Friday.    [provider]      Allergies    Demerol [meperidine], Oxycodone, Adhesive [tape], Ivp dye [iodinated contrast media], and Phenergan [promethazine]    Review of Systems   Review of Systems  Neurological:  Positive for headaches.  All other systems reviewed and are negative.   Physical Exam Updated Vital Signs BP (!) 153/59   Pulse (!) 51   Temp 98.5 F (36.9 C) (Oral)   Resp 17   Ht 4' 11.75" (1.518 m)   Wt  54 kg   SpO2 99%   BMI 23.44 kg/m  Physical Exam Vitals and nursing note reviewed.  Constitutional:      Appearance: Normal appearance.  HENT:     Head: Normocephalic and atraumatic.      Right Ear: External ear normal.     Left Ear: External ear normal.     Nose: Nose normal.     Mouth/Throat:     Mouth: Mucous membranes are moist.     Pharynx: Oropharynx is clear.  Eyes:     Extraocular Movements: Extraocular movements intact.     Conjunctiva/sclera: Conjunctivae normal.     Pupils: Pupils are equal, round, and reactive to light.  Cardiovascular:     Rate and Rhythm: Normal rate and regular rhythm.     Pulses: Normal pulses.     Heart sounds: Normal heart  sounds.  Pulmonary:     Effort: Pulmonary effort is normal.     Breath sounds: Normal breath sounds.  Abdominal:     General: Abdomen is flat. Bowel sounds are normal.     Palpations: Abdomen is soft.  Musculoskeletal:        General: Normal range of motion.  Skin:    General: Skin is warm.     Capillary Refill: Capillary refill takes less than 2 seconds.  Neurological:     General: No focal deficit present.     Mental Status: She is alert and oriented to person, place, and time.  Psychiatric:        Mood and Affect: Mood normal.        Behavior: Behavior normal.     ED Results / Procedures / Treatments   Labs (all labs ordered are listed, but only abnormal results are displayed) Labs Reviewed  COMPREHENSIVE METABOLIC PANEL - Abnormal; Notable for the following components:      Result Value   BUN 24 (*)    Creatinine, Ser 1.80 (*)    Total Protein 6.4 (*)    GFR, Estimated 28 (*)    All other components within normal limits  CBC - Abnormal; Notable for the following components:   RBC 3.58 (*)    MCV 104.2 (*)    MCH 34.4 (*)    All other components within normal limits  PROTIME-INR - Abnormal; Notable for the following components:   Prothrombin Time 32.6 (*)    INR 3.2 (*)    All other components within normal limits    EKG None  Radiology CT Cervical Spine Wo Contrast  Result Date: 07/01/2022 CLINICAL DATA:  Neck trauma (Age >= 65y) EXAM: CT CERVICAL SPINE WITHOUT CONTRAST TECHNIQUE: Multidetector CT imaging of the cervical spine was performed without intravenous contrast. Multiplanar CT image reconstructions were also generated. RADIATION DOSE REDUCTION: This exam was performed according to the departmental dose-optimization program which includes automated exposure control, adjustment of the mA and/or kV according to patient size and/or use of iterative reconstruction technique. COMPARISON:  CT C-spine 03/25/2019 FINDINGS: Alignment: Normal. Skull base and  vertebrae: Multilevel mild degenerative changes spine. No acute fracture. No aggressive appearing focal osseous lesion or focal pathologic process. Soft tissues and spinal canal: No prevertebral fluid or swelling. No visible canal hematoma. Upper chest: Unremarkable. Other: Atherosclerotic plaque. IMPRESSION: 1. No acute displaced fracture or traumatic listhesis of the cervical spine. 2.  Aortic Atherosclerosis (ICD10-I70.0). Electronically Signed   By: Iven Finn M.D.   On: 07/01/2022 20:55   CT HEAD WO CONTRAST  Result Date: 07/01/2022 CLINICAL  DATA:  Moderate to severe head trauma.  On Coumadin. EXAM: CT HEAD WITHOUT CONTRAST TECHNIQUE: Contiguous axial images were obtained from the base of the skull through the vertex without intravenous contrast. RADIATION DOSE REDUCTION: This exam was performed according to the departmental dose-optimization program which includes automated exposure control, adjustment of the mA and/or kV according to patient size and/or use of iterative reconstruction technique. COMPARISON:  CT head 09/10/2021 FINDINGS: Brain: Mild atrophy. Patchy white matter hypodensity bilaterally consistent with chronic microvascular ischemia. Negative for acute infarct, hemorrhage, mass Vascular: Negative for hyperdense vessel Skull: Negative Sinuses/Orbits: Paranasal sinuses clear. Bilateral cataract extraction Other: Left occipital scalp hematoma. IMPRESSION: No acute abnormality. Atrophy and chronic microvascular ischemia Left occipital scalp hematoma Electronically Signed   By: Franchot Gallo M.D.   On: 07/01/2022 19:17   DG Pelvis Portable  Result Date: 07/01/2022 CLINICAL DATA:  81 year old post trauma. EXAM: PORTABLE PELVIS 1-2 VIEWS COMPARISON:  None Available. FINDINGS: The cortical margins of the bony pelvis are intact. No fracture. Pubic symphysis and sacroiliac joints are congruent. Both femoral heads are well-seated in the respective acetabula. Mild bilateral hip osteoarthritis.  IMPRESSION: No pelvic fracture. Electronically Signed   By: Keith Rake M.D.   On: 07/01/2022 18:51   DG Chest Port 1 View  Result Date: 07/01/2022 CLINICAL DATA:  81 year old post trauma. EXAM: PORTABLE CHEST 1 VIEW COMPARISON:  02/05/2022 FINDINGS: Lung volumes are low. Prior median sternotomy with prosthetic cardiac valve. Normal heart size for low volume technique. Stable mediastinal contours. Aortic atherosclerosis. No pneumothorax, confluent consolidation or large pleural effusion. On limited evaluation, no acute osseous abnormalities are seen. Right lateral rib fracture is remote and present on prior. IMPRESSION: Low lung volumes without evidence of acute traumatic injury. Electronically Signed   By: Keith Rake M.D.   On: 07/01/2022 18:50    Procedures Procedures    Medications Ordered in ED Medications - No data to display  ED Course/ Medical Decision Making/ A&P                           Medical Decision Making Amount and/or Complexity of Data Reviewed Labs: ordered. Radiology: ordered.   This patient presents to the ED for concern of fall, this involves an extensive number of treatment options, and is a complaint that carries with it a high risk of complications and morbidity.  The differential diagnosis includes multiple trauma   Co morbidities that complicate the patient evaluation  r htn, gerd, ibs, hld, brca, kidney stones, afib (on coumadin), skin cancer and s/p AV repair (mechanical valve)   Additional history obtained:  Additional history obtained from epic chart review   Lab Tests:  I Ordered, and personally interpreted labs.  The pertinent results include:  cbc nl, inr 3.2, cmp with cr 1.8 (chronic),    Imaging Studies ordered:  I ordered imaging studies including cxr and pelvis xr; ct head/ct cspine  I independently visualized and interpreted imaging which showed  CXR: IMPRESSION:  Low lung volumes without evidence of acute traumatic injury.   Pelvis: IMPRESSION:  No pelvic fracture.  CT head: IMPRESSION:  No acute abnormality.    Atrophy and chronic microvascular ischemia    Left occipital scalp hematoma  CT cervical spine: IMPRESSION:  1. No acute displaced fracture or traumatic listhesis of the  cervical spine.  2.  Aortic Atherosclerosis (ICD10-I70.0).   I agree with the radiologist interpretation   Medicines ordered and prescription drug management:  I have reviewed the patients home medicines and have made adjustments as needed   Test Considered:  ct   Critical Interventions:  ct    Problem List / ED Course:  Fall with occipital hematoma:  CT scans nl.  Pt is stable for d/c.  She is to return if worse.  Hold coumadin tonight.  F/u with pcp.   Reevaluation:  After the interventions noted above, I reevaluated the patient and found that they have :improved   Social Determinants of Health:  Lives at home   Dispostion:  After consideration of the diagnostic results and the patients response to treatment, I feel that the patent would benefit from discharge with outpatient f/u.          Final Clinical Impression(s) / ED Diagnoses Final diagnoses:  Anticoagulated on Coumadin  Hematoma of scalp, initial encounter    Rx / DC Orders ED Discharge Orders     None         Isla Pence, MD 07/02/22 1610

## 2022-07-01 NOTE — Discharge Instructions (Addendum)
Hold coumadin tonight.

## 2022-07-01 NOTE — ED Triage Notes (Signed)
Pt presents after fall on coumadin, stumbled back when going to restroom & hit her head. Pt wearing "helmet" on arrival, states she wears it "for this reason." Pt A&O, GCS 15, at baseline. Uses a cane. Denies CP, neck pain, SHOB

## 2022-07-01 NOTE — ED Notes (Signed)
Trauma Response Nurse Documentation   [name] is a 81 y.o. female arriving to [facility] ED via POV  On warfarin daily. Trauma was activated as a Level 2 by ED Charge RN based on the following trauma criteria Elderly patients > 65 with head trauma on anti-coagulation (excluding ASA). Trauma team at the bedside on patient arrival. Patient cleared for CT by Dr. Gilford Raid. Patient to CT with team. GCS 15.  History   Past Medical History:  Diagnosis Date   BPV (benign positional vertigo)    Chronic neck pain    C2-3 arthropathy   CKD (chronic kidney disease), stage II    Diverticulosis of colon    GERD (gastroesophageal reflux disease)    Hiatal hernia    History of breast cancer per pt no recurrence   dx 2004-- Left breast DCIS (ER/PR+, HER2 negative) s/p  partial masectomy w/ sln bx and  chemoradiotion (completed 2004)   History of herpes zoster    History of kidney stones    HTN (hypertension)    Hyperlipidemia    Irritable bowel syndrome    LBBB (left bundle branch block)    PAF (paroxysmal atrial fibrillation) (HCC)    a. CHA2DS2VASc = 4-->coumadin.   Parkinsonism [facility])    neurologist-  dr tat   PONV (postoperative nausea and vomiting)    S/P aortic valve replacement with prosthetic valve    a. 04/13/2006 s/p St Jude mechnical AVR for severe AS;  b. 09/2014 Echo: EF 40-45%, gr1 DD, mild AI/MR, triv TR/PR.   Squamous cell carcinoma of skin 04/06/2022   right nasal sidewall   SUI (stress urinary incontinence, female)    Symptomatic anemia    Venous varices       Past Surgical History:  Procedure Laterality Date   ANTERIOR AND POSTERIOR REPAIR  1990   cystocele/ rectocele   AORTIC VALVE REPLACEMENT  04/13/06   dr gerhardt   and Replacement Ascending Aorta (St. Jude mechanical prosthesis)   APPENDECTOMY  child 1950   BREAST EXCISIONAL BIOPSY Left    BREAST LUMPECTOMY Left 01/11/2003   malignant   BREAST SURGERY Left 2004   CARDIAC CATHETERIZATION  02-22-2001    dr Martinique   minimal non-obstructive cad/  mild aortic stenosis and aortic root enlargement/  normal LVF, ef 65%   CARDIAC CATHETERIZATION  03-22-2006    dr Martinique   no significant obstructive cad/  severe aortic stenosis and mild to moderate aortic root enlargement/  upper normal right heart pressures   CARDIOVASCULAR STRESS TEST  09-30-2011   dr Martinique   lexiscan nuclear study w/ apical thinning  vs  small prior apical infarct w/ no significant ischemia/  hypokinesis of the distal septum and apex, ef 50%   CARPAL TUNNEL RELEASE Bilateral left 09-09-2004/  right 2007   CATARACT EXTRACTION W/ INTRAOCULAR LENS  IMPLANT, BILATERAL  2015   COLONOSCOPY N/A 10/03/2014   Procedure: COLONOSCOPY;  Surgeon: [name], MD;  Location: [place];  Service: Endoscopy;  Laterality: N/A;   CYSTOSCOPY WITH RETROGRADE PYELOGRAM, URETEROSCOPY AND STENT PLACEMENT Right 09/17/2016   Procedure: CYSTOSCOPY WITH RIGHT RETROGRADE PYELOGRAM, RIGHT URETEROSCOPY AND STENT PLACEMENT LASER LITHOTRIPSY, RIGHT STENT PLACEMENT;  Surgeon: [name], MD;  Location: [facility];  Service: Urology;  Laterality: Right;   ESOPHAGEAL MANOMETRY N/A 08/12/2014   Procedure: ESOPHAGEAL MANOMETRY (EM);  Surgeon: [name], MD;  Location: [facility];  Service: Endoscopy;  Laterality: N/A;   ESOPHAGOGASTRODUODENOSCOPY N/A  10/03/2014   Procedure: ESOPHAGOGASTRODUODENOSCOPY (EGD);  Surgeon: [name], MD;  Location[facility] ENDOSCOPY;  Service: Endoscopy;  Laterality: N/A;   ESOPHAGOGASTRODUODENOSCOPY N/A 12/08/2018   Procedure: ESOPHAGOGASTRODUODENOSCOPY (EGD);  Surgeon: Doran Stabler, MD;  Location: [place];  Service: Gastroenterology;  Laterality: N/A;   EXTRACORPOREAL SHOCK WAVE LITHOTRIPSY  1997   FOOT SURGERY Right 2013   hammertoe x2   HOLMIUM LASER APPLICATION Right 03/09/9701   Procedure: HOLMIUM LASER APPLICATION;  Surgeon: [name], MD;  Location: [facility];   Service: Urology;  Laterality: Right;   PERCUTANEOUS NEPHROSTOLITHOTOMY  1993   STONE EXTRACTION WITH BASKET Right 09/17/2016   Procedure: STONE EXTRACTION WITH BASKET;  Surgeon: [name], MD;  Location: [facility];  Service: Urology;  Laterality: Right;   TOTAL ABDOMINAL HYSTERECTOMY  1971   w/ Bilateral Salpingoophorectomy   TRANSTHORACIC ECHOCARDIOGRAM  10/24/2014   dr Martinique   hypokinesis of the basal and mid inferoseptal and inferior walls,  ef 40-45%,  grade 1 diastolic dysfunction/  mechanical bileaflet aortic valve noted w/ mild regur., peak grandient 27mHg, valve area 1.35cm^2/  severe MV calcification without stenosis w/ mild regurg., peak gradient 324mg/  mild LAE/  poss. atrial mass (MRI showed benign lipomatous hypertrophy of atrial septum) /tFrazier ButtR/mild TR      Initial Focused Assessment (If applicable, or please see trauma documentation): - GCS 15 - A/Ox4 - PERRLA - No LOC - Large hematoma with controlled minimal bleeding to the L occiput region of the head. - c/o pain to head and bilateral hands  CT's Completed:   CT Head   Interventions:  - CT head - CXR - Pelvic XR  Plan for disposition:  Other Awaiting scan results  Consults completed:  none at 1915 .  Event Summary: Pt presents after fall on coumadin, stumbled back when going to restroom & hit her head on the door frame.  Pt wearing  soft "helmet" on arrival, states she wears it "for this reason." - She states she has PSP.  Pt A&O, GCS 15, at baseline. Uses a cane. Denies CP, neck pain, SHOB.     Bedside handoff with ED RN AnDeneise Lever   WAClovis CaoTrauma Response RN  Please call TRN at [phone] further assistance.

## 2022-07-07 DIAGNOSIS — S0990XA Unspecified injury of head, initial encounter: Secondary | ICD-10-CM | POA: Diagnosis not present

## 2022-07-07 DIAGNOSIS — I480 Paroxysmal atrial fibrillation: Secondary | ICD-10-CM | POA: Diagnosis not present

## 2022-07-07 DIAGNOSIS — Z952 Presence of prosthetic heart valve: Secondary | ICD-10-CM | POA: Diagnosis not present

## 2022-07-07 DIAGNOSIS — Z7901 Long term (current) use of anticoagulants: Secondary | ICD-10-CM | POA: Diagnosis not present

## 2022-07-07 DIAGNOSIS — G20 Parkinson's disease: Secondary | ICD-10-CM | POA: Diagnosis not present

## 2022-07-07 DIAGNOSIS — W19XXXA Unspecified fall, initial encounter: Secondary | ICD-10-CM | POA: Diagnosis not present

## 2022-07-20 DIAGNOSIS — Z6822 Body mass index (BMI) 22.0-22.9, adult: Secondary | ICD-10-CM | POA: Diagnosis not present

## 2022-07-20 DIAGNOSIS — E785 Hyperlipidemia, unspecified: Secondary | ICD-10-CM | POA: Diagnosis not present

## 2022-07-22 DIAGNOSIS — Z7901 Long term (current) use of anticoagulants: Secondary | ICD-10-CM | POA: Diagnosis not present

## 2022-07-22 DIAGNOSIS — Z952 Presence of prosthetic heart valve: Secondary | ICD-10-CM | POA: Diagnosis not present

## 2022-07-22 DIAGNOSIS — I480 Paroxysmal atrial fibrillation: Secondary | ICD-10-CM | POA: Diagnosis not present

## 2022-07-28 ENCOUNTER — Ambulatory Visit
Admission: RE | Admit: 2022-07-28 | Discharge: 2022-07-28 | Disposition: A | Discharge status: Home / Self Care | Payer: PPO | Source: Ambulatory Visit | Attending: Internal Medicine | Admitting: Internal Medicine

## 2022-07-28 DIAGNOSIS — Z1231 Encounter for screening mammogram for malignant neoplasm of breast: Secondary | ICD-10-CM | POA: Diagnosis not present

## 2022-08-03 NOTE — Progress Notes (Signed)
PCP:  Donnajean Lopes, MD Primary Cardiologist: Peter Martinique, MD Electrophysiologist: Cristopher Peru, MD   [name] is a 81 y.o. female seen today for Cristopher Peru, MD for routine electrophysiology followup.  Since last being seen in our clinic the patient reports doing well overall.  She is quite limited from her PSP (progressive supranuclear palsy). She uses a walker at all times. She has mild SOB when bending down for her shoes. No outright SOB on exertion, but as above, very limited. She has not realized she was back in AF. She has not needed to take lopressor in years.   Past Medical History:  Diagnosis Date   BPV (benign positional vertigo)    Chronic neck pain    C2-3 arthropathy   CKD (chronic kidney disease), stage II    Diverticulosis of colon    GERD (gastroesophageal reflux disease)    Hiatal hernia    History of breast cancer per pt no recurrence   dx 2004-- Left breast DCIS (ER/PR+, HER2 negative) s/p  partial masectomy w/ sln bx and  chemoradiotion (completed 2004)   History of herpes zoster    History of kidney stones    HTN (hypertension)    Hyperlipidemia    Irritable bowel syndrome    LBBB (left bundle branch block)    PAF (paroxysmal atrial fibrillation) (HCC)    a. CHA2DS2VASc = 4-->coumadin.   Parkinsonism [facility])    neurologist-  dr tat   PONV (postoperative nausea and vomiting)    S/P aortic valve replacement with prosthetic valve    a. 04/13/2006 s/p St Jude mechnical AVR for severe AS;  b. 09/2014 Echo: EF 40-45%, gr1 DD, mild AI/MR, triv TR/PR.   Squamous cell carcinoma of skin 04/06/2022   right nasal sidewall   SUI (stress urinary incontinence, female)    Symptomatic anemia    Venous varices     Past Surgical History:  Procedure Laterality Date   ANTERIOR AND POSTERIOR REPAIR  1990   cystocele/ rectocele   AORTIC VALVE REPLACEMENT  04/13/06   dr gerhardt   and Replacement Ascending Aorta (St. Jude mechanical prosthesis)    APPENDECTOMY  child 1950   BREAST EXCISIONAL BIOPSY Left    BREAST LUMPECTOMY Left 01/11/2003   malignant   BREAST SURGERY Left 2004   CARDIAC CATHETERIZATION  02-22-2001   dr Martinique   minimal non-obstructive cad/  mild aortic stenosis and aortic root enlargement/  normal LVF, ef 65%   CARDIAC CATHETERIZATION  03-22-2006    dr Martinique   no significant obstructive cad/  severe aortic stenosis and mild to moderate aortic root enlargement/  upper normal right heart pressures   CARDIOVASCULAR STRESS TEST  09-30-2011   dr Martinique   lexiscan nuclear study w/ apical thinning  vs  small prior apical infarct w/ no significant ischemia/  hypokinesis of the distal septum and apex, ef 50%   CARPAL TUNNEL RELEASE Bilateral left 09-09-2004/  right 2007   CATARACT EXTRACTION W/ INTRAOCULAR LENS  IMPLANT, BILATERAL  2015   COLONOSCOPY N/A 10/03/2014   Procedure: COLONOSCOPY;  Surgeon: [name], MD;  Location: [place];  Service: Endoscopy;  Laterality: N/A;   CYSTOSCOPY WITH RETROGRADE PYELOGRAM, URETEROSCOPY AND STENT PLACEMENT Right 09/17/2016   Procedure: CYSTOSCOPY WITH RIGHT RETROGRADE PYELOGRAM, RIGHT URETEROSCOPY AND STENT PLACEMENT LASER LITHOTRIPSY, RIGHT STENT PLACEMENT;  Surgeon: [name], MD;  Location: [facility];  Service: Urology;  Laterality: Right;   ESOPHAGEAL MANOMETRY N/A  08/12/2014   Procedure: ESOPHAGEAL MANOMETRY (EM);  Surgeon: [name], MD;  Location: [facility];  Service: Endoscopy;  Laterality: N/A;   ESOPHAGOGASTRODUODENOSCOPY N/A 10/03/2014   Procedure: ESOPHAGOGASTRODUODENOSCOPY (EGD);  Surgeon: [name], MD;  Location: [facility];  Service: Endoscopy;  Laterality: N/A;   ESOPHAGOGASTRODUODENOSCOPY N/A 12/08/2018   Procedure: ESOPHAGOGASTRODUODENOSCOPY (EGD);  Surgeon: Doran Stabler, MD;  Location: [place];  Service: Gastroenterology;  Laterality: N/A;   EXTRACORPOREAL SHOCK WAVE LITHOTRIPSY  1997   FOOT SURGERY Right 2013    hammertoe x2   HOLMIUM LASER APPLICATION Right 3/33/5456   Procedure: HOLMIUM LASER APPLICATION;  Surgeon: [name], MD;  Location: [facility];  Service: Urology;  Laterality: Right;   PERCUTANEOUS NEPHROSTOLITHOTOMY  1993   STONE EXTRACTION WITH BASKET Right 09/17/2016   Procedure: STONE EXTRACTION WITH BASKET;  Surgeon: [name], MD;  Location[facility];  Service: Urology;  Laterality: Right;   TOTAL ABDOMINAL HYSTERECTOMY  1971   w/ Bilateral Salpingoophorectomy   TRANSTHORACIC ECHOCARDIOGRAM  10/24/2014   dr Martinique   hypokinesis of the basal and mid inferoseptal and inferior walls,  ef 40-45%,  grade 1 diastolic dysfunction/  mechanical bileaflet aortic valve noted w/ mild regur., peak grandient 50mHg, valve area 1.35cm^2/  severe MV calcification without stenosis w/ mild regurg., peak gradient 323mg/  mild LAE/  poss. atrial mass (MRI showed benign lipomatous hypertrophy of atrial septum) /tFrazier ButtR/mild TR    Current Outpatient Medications  Medication Sig Dispense Refill   amiodarone (PACERONE) 200 MG tablet Take 1 tablet by mouth once daily (Patient taking differently: Take 200 mg by mouth daily. DOES NOT TAKE ON SUNDAYS) 90 tablet 3   calcium carbonate (TUMS - DOSED IN MG ELEMENTAL CALCIUM) 500 MG chewable tablet Chew 1 tablet by mouth daily as needed for indigestion or heartburn.      calcium citrate-vitamin D (CITRACAL+D) 315-200 MG-UNIT per tablet Take 1 tablet by mouth 3 (three) times daily.     carbidopa-levodopa (SINEMET IR) 25-100 MG tablet Take 2 tablets by mouth 3 (three) times daily. 540 tablet 1   Cholecalciferol (VITAMIN D3) 2000 units TABS Take 2,000 Units by mouth daily.      famotidine-calcium carbonate-magnesium hydroxide (PEPCID COMPLETE) 10-800-165 MG CHEW chewable tablet Chew 1 tablet by mouth daily as needed (heartburn).     fexofenadine (ALLEGRA) 60 MG tablet Take 60 mg by mouth at bedtime.     furosemide  (LASIX) 20 MG tablet Take 1 tablet by mouth once daily 90 tablet 3   melatonin 5 MG TABS Take 5 mg by mouth at bedtime.     metoprolol tartrate (LOPRESSOR) 25 MG tablet Take 1 tablet (25 mg total) by mouth daily as needed (if heart rate is above 110). 90 tablet 3   Multiple Vitamin (MULTIVITAMIN) tablet Take 1 tablet by mouth daily at 12 noon.     pantoprazole (PROTONIX) 40 MG tablet Take 40 mg by mouth daily.     potassium chloride SA (KLOR-CON M) 20 MEQ tablet Take 20 mEq by mouth daily.     Probiotic Product (PROBIOTIC-10 PO) Take 1 capsule by mouth daily.      simvastatin (ZOCOR) 20 MG tablet Take 20 mg by mouth every evening.     vitamin B-12 (CYANOCOBALAMIN) 1000 MCG tablet Take 1,000 mcg by mouth every morning.     warfarin (COUMADIN) 5 MG tablet Take 2.5-5 mg by mouth every evening. Patient is taking 2.35m43mn Sunday,  Tues, Thur. Sat. Patient is taking 5 mg on Monday, Wed, Friday.     No current facility-administered medications for this visit.    Allergies  Allergen Reactions   Demerol [Meperidine] Shortness Of Breath and Swelling   Oxycodone Other (See Comments)    Hallucinations    Adhesive [Tape] Other (See Comments)    Redness and skin tears   Ivp Dye [Iodinated Contrast Media] Hives    OK with benadryl pre-med (76m one hour before receiving iodinated contrast agent)   Phenergan [Promethazine] Other (See Comments)    Avoids due to reaction with current medications    Social History   Socioeconomic History   Marital status: Married    Spouse name: Not on file   Number of children: 3   Years of education: 11   Highest education level: 11th grade  Occupational History   Occupation: bPress photographer OTHER    Comment: retired  Tobacco Use   Smoking status: Never   Smokeless tobacco: Never  Vaping Use   Vaping Use: Never used  Substance and Sexual Activity   Alcohol use: No    Alcohol/week: 0.0 standard drinks of alcohol   Drug use: No   Sexual  activity: Not on file  Other Topics Concern   Not on file  Social History Narrative      1/2 -1 soda a day     Right handed   Two story home   lives with spouse   Social Determinants of Health   Financial Resource Strain: Not on file  Food Insecurity: Not on file  Transportation Needs: Not on file  Physical Activity: Not on file  Stress: Not on file  Social Connections: Not on file  Intimate Partner Violence: Not on file     Review of Systems: All other systems reviewed and are otherwise negative except as noted above.  Physical Exam: Vitals:   08/10/22 0847  BP: 118/62  Pulse: 67  SpO2: 94%  Weight: 111 lb 6.4 oz (50.5 kg)  Height: 4' 11.75" (1.518 m)    GEN- The patient is well appearing, alert and oriented x 3 today.   HEENT: normocephalic, atraumatic; sclera clear, conjunctiva pink; hearing intact; oropharynx clear; neck supple, no JVP Lymph- no cervical lymphadenopathy Lungs- Clear to ausculation bilaterally, normal work of breathing.  No wheezes, rales, rhonchi Heart- Regular rate and rhythm, no murmurs, rubs or gallops, PMI not laterally displaced GI- soft, non-tender, non-distended, bowel sounds present, no hepatosplenomegaly Extremities- no clubbing, cyanosis, or edema; DP/PT/radial pulses 2+ bilaterally MS- no significant deformity or atrophy Skin- warm and dry, no rash or lesion Psych- euthymic mood, full affect Neuro- strength and sensation are intact  EKG is ordered. Personal review of EKG from today shows AF at 67 bpm  Additional studies reviewed include: Previous EP office notes.   Assessment and Plan:  1. Paroxysmal Atrial Fibrillation  EKG today shows AF at 67 bpm Continue Coumadin for CHA2DS2VASC of at least 6 Continue Amiodarone  200 mg daily except for Sundays.  She is not currently having symptoms of being in atrial fibrillation.  Labs today.   2. HTN Stable on current regimen   3. Chronic systolic CHF 4. LBBB Echo 05/2020 LVEF  30-35% NYHA 1 symptoms  Follow up with EP APP in  6-8 weeks . If she remains in AF, consider stopping amiodarone. If she feels she is having symptoms, [place]  I think conservative management will be best.   MLegrand Como  Joesph July, PA-C  08/10/22 9:02 AM

## 2022-08-10 ENCOUNTER — Ambulatory Visit: Payer: PPO | Admitting: Student

## 2022-08-10 ENCOUNTER — Encounter: Payer: Self-pay | Admitting: Student

## 2022-08-10 VITALS — BP 118/62 | HR 67 | Ht 59.75 in | Wt 111.4 lb

## 2022-08-10 DIAGNOSIS — I5042 Chronic combined systolic (congestive) and diastolic (congestive) heart failure: Secondary | ICD-10-CM

## 2022-08-10 DIAGNOSIS — I480 Paroxysmal atrial fibrillation: Secondary | ICD-10-CM | POA: Diagnosis not present

## 2022-08-10 DIAGNOSIS — I10 Essential (primary) hypertension: Secondary | ICD-10-CM | POA: Diagnosis not present

## 2022-08-10 NOTE — Patient Instructions (Signed)
Medication Instructions:  Your physician recommends that you continue on your current medications as directed. Please refer to the Current Medication list given to you today.  *If you need a refill on your cardiac medications before your next appointment, please call your pharmacy*   Lab Work: TODAY: CMET, CBC, TSH, FreeT4  If you have labs (blood work) drawn today and your tests are completely normal, you will receive your results only by: [place] (if you have MyChart) OR A paper copy in the mail If you have any lab test that is abnormal or we need to change your treatment, we will call you to review the results.   Follow-Up: At [facility], you and your health needs are our priority.  As part of our continuing mission to provide you with exceptional heart care, we have created designated Provider Care Teams.  These Care Teams include your primary Cardiologist (physician) and Advanced Practice Providers (APPs -  Physician Assistants and Nurse Practitioners) who all work together to provide you with the care you need, when you need it.   Your next appointment:   09/21/2022

## 2022-08-11 LAB — CBC
Hematocrit: 35.1 % (ref 34.0–46.6)
Hemoglobin: 11.9 g/dL (ref 11.1–15.9)
MCH: 33.8 pg — ABNORMAL HIGH (ref 26.6–33.0)
MCHC: 33.9 g/dL (ref 31.5–35.7)
MCV: 100 fL — ABNORMAL HIGH (ref 79–97)
Platelets: 255 10*3/uL (ref 150–450)
RBC: 3.52 x10E6/uL — ABNORMAL LOW (ref 3.77–5.28)
RDW: 12.2 % (ref 11.7–15.4)
WBC: 7.1 10*3/uL (ref 3.4–10.8)

## 2022-08-11 LAB — COMPREHENSIVE METABOLIC PANEL
ALT: 11 IU/L (ref 0–32)
AST: 32 IU/L (ref 0–40)
Albumin/Globulin Ratio: 1.4 (ref 1.2–2.2)
Albumin: 3.6 g/dL — ABNORMAL LOW (ref 3.7–4.7)
Alkaline Phosphatase: 89 IU/L (ref 44–121)
BUN/Creatinine Ratio: 14 (ref 12–28)
BUN: 29 mg/dL — ABNORMAL HIGH (ref 8–27)
Bilirubin Total: 0.3 mg/dL (ref 0.0–1.2)
CO2: 26 mmol/L (ref 20–29)
Calcium: 10.8 mg/dL — ABNORMAL HIGH (ref 8.7–10.3)
Chloride: 100 mmol/L (ref 96–106)
Creatinine, Ser: 2.02 mg/dL — ABNORMAL HIGH (ref 0.57–1.00)
Globulin, Total: 2.5 g/dL (ref 1.5–4.5)
Glucose: 115 mg/dL — ABNORMAL HIGH (ref 70–99)
Potassium: 4.1 mmol/L (ref 3.5–5.2)
Sodium: 141 mmol/L (ref 134–144)
Total Protein: 6.1 g/dL (ref 6.0–8.5)
eGFR: 24 mL/min/{1.73_m2} — ABNORMAL LOW (ref >59)

## 2022-08-11 LAB — TSH: TSH: 3.52 u[IU]/mL (ref 0.450–4.500)

## 2022-08-11 LAB — T4, FREE: Free T4: 1.95 ng/dL — ABNORMAL HIGH (ref 0.82–1.77)

## 2022-08-19 DIAGNOSIS — R7989 Other specified abnormal findings of blood chemistry: Secondary | ICD-10-CM | POA: Diagnosis not present

## 2022-08-19 DIAGNOSIS — M8580 Other specified disorders of bone density and structure, unspecified site: Secondary | ICD-10-CM | POA: Diagnosis not present

## 2022-08-19 DIAGNOSIS — Z7901 Long term (current) use of anticoagulants: Secondary | ICD-10-CM | POA: Diagnosis not present

## 2022-08-19 DIAGNOSIS — E785 Hyperlipidemia, unspecified: Secondary | ICD-10-CM | POA: Diagnosis not present

## 2022-08-19 DIAGNOSIS — D649 Anemia, unspecified: Secondary | ICD-10-CM | POA: Diagnosis not present

## 2022-08-19 DIAGNOSIS — E7800 Pure hypercholesterolemia, unspecified: Secondary | ICD-10-CM | POA: Diagnosis not present

## 2022-08-19 DIAGNOSIS — I10 Essential (primary) hypertension: Secondary | ICD-10-CM | POA: Diagnosis not present

## 2022-08-19 DIAGNOSIS — Z0000 Encounter for general adult medical examination without abnormal findings: Secondary | ICD-10-CM | POA: Diagnosis not present

## 2022-08-26 DIAGNOSIS — I480 Paroxysmal atrial fibrillation: Secondary | ICD-10-CM | POA: Diagnosis not present

## 2022-08-26 DIAGNOSIS — E785 Hyperlipidemia, unspecified: Secondary | ICD-10-CM | POA: Diagnosis not present

## 2022-08-26 DIAGNOSIS — G231 Progressive supranuclear ophthalmoplegia [Steele-Richardson-Olszewski]: Secondary | ICD-10-CM | POA: Diagnosis not present

## 2022-08-26 DIAGNOSIS — I10 Essential (primary) hypertension: Secondary | ICD-10-CM | POA: Diagnosis not present

## 2022-08-26 DIAGNOSIS — Z0000 Encounter for general adult medical examination without abnormal findings: Secondary | ICD-10-CM | POA: Diagnosis not present

## 2022-08-26 DIAGNOSIS — Z952 Presence of prosthetic heart valve: Secondary | ICD-10-CM | POA: Diagnosis not present

## 2022-08-26 DIAGNOSIS — I5042 Chronic combined systolic (congestive) and diastolic (congestive) heart failure: Secondary | ICD-10-CM | POA: Diagnosis not present

## 2022-08-26 DIAGNOSIS — I129 Hypertensive chronic kidney disease with stage 1 through stage 4 chronic kidney disease, or unspecified chronic kidney disease: Secondary | ICD-10-CM | POA: Diagnosis not present

## 2022-08-26 DIAGNOSIS — R82998 Other abnormal findings in urine: Secondary | ICD-10-CM | POA: Diagnosis not present

## 2022-08-26 DIAGNOSIS — N184 Chronic kidney disease, stage 4 (severe): Secondary | ICD-10-CM | POA: Diagnosis not present

## 2022-08-26 DIAGNOSIS — Z7901 Long term (current) use of anticoagulants: Secondary | ICD-10-CM | POA: Diagnosis not present

## 2022-08-26 DIAGNOSIS — Z853 Personal history of malignant neoplasm of breast: Secondary | ICD-10-CM | POA: Diagnosis not present

## 2022-09-07 NOTE — Progress Notes (Deleted)
Assessment/Plan:   1.  PSP  -Patient understands differences between PSP and Parkinson's disease.  Understands that there is an increased risk of falls, aspiration as subsequent morbidity and mortality from this disease compared to Parkinson's disease.    -Reiterated again, as with last many visits, that I do not want her walking at all.  She needs to use motorized wheelchair at all times, unless husband is directly holding her.  -continue carbidopa/levodopa 25/100, 2 po tid  -seeing Dr. Valetta Close   2.  Neck pain with cervical dystonia  -This is actually improved with levodopa therapy.  -Her last MRI was in 2017 with evidence of severe C2/C3 left facet arthropathy with joint effusion and bone marrow edema.  She received injections at the time and pain improved.  3.  Cough, likely associated with reflux  -Follows with primary care.  Is on Pepcid and PPI.  Last modified barium swallow was in June, 2019 and was normal.  She doesn't feel like she has dysphagia  4.  A-fib  -on coumadin.  Cardiologist said that Coumadin could not be stopped because she had a mechanical valve, but he did recommend lower target range of INR (was 3.2 when she fell in July).  Primary care is managing  Subjective:   [name] was seen today in follow up for PSP.  My previous records were reviewed prior to todays visit as well as outside records available to me.  This patient is accompanied in the office by her spouse who supplements the history.  Last visit, we talked about her not walking at all, and have talked about this several other visits.  Patient in the emergency room July 6 after a fall.  Notes reviewed.  She was going to the bathroom.  She hit the back of her head.  CT brain indicated left occipital scalp hematoma.  No intracranial bleed.  Her INR was 3.2  Current prescribed movement disorder medications: Carbidopa/levodopa 25/100, 2 tablets 3 times per day    ALLERGIES:   Allergies   Allergen Reactions   Demerol [Meperidine] Shortness Of Breath and Swelling   Oxycodone Other (See Comments)    Hallucinations    Adhesive [Tape] Other (See Comments)    Redness and skin tears   Ivp Dye [Iodinated Contrast Media] Hives    OK with benadryl pre-med ('50mg'$  one hour before receiving iodinated contrast agent)   Phenergan [Promethazine] Other (See Comments)    Avoids due to reaction with current medications    CURRENT MEDICATIONS:  Outpatient Encounter Medications as of 09/13/2022  Medication Sig   amiodarone (PACERONE) 200 MG tablet Take 1 tablet by mouth once daily (Patient taking differently: Take 200 mg by mouth daily. DOES NOT TAKE ON SUNDAYS)   calcium carbonate (TUMS - DOSED IN MG ELEMENTAL CALCIUM) 500 MG chewable tablet Chew 1 tablet by mouth daily as needed for indigestion or heartburn.    calcium citrate-vitamin D (CITRACAL+D) 315-200 MG-UNIT per tablet Take 1 tablet by mouth 3 (three) times daily.   carbidopa-levodopa (SINEMET IR) 25-100 MG tablet Take 2 tablets by mouth 3 (three) times daily.   Cholecalciferol (VITAMIN D3) 2000 units TABS Take 2,000 Units by mouth daily.    famotidine-calcium carbonate-magnesium hydroxide (PEPCID COMPLETE) 10-800-165 MG CHEW chewable tablet Chew 1 tablet by mouth daily as needed (heartburn).   fexofenadine (ALLEGRA) 60 MG tablet Take 60 mg by mouth at bedtime.   furosemide (LASIX) 20 MG tablet Take 1 tablet by mouth once daily  melatonin 5 MG TABS Take 5 mg by mouth at bedtime.   metoprolol tartrate (LOPRESSOR) 25 MG tablet Take 1 tablet (25 mg total) by mouth daily as needed (if heart rate is above 110).   Multiple Vitamin (MULTIVITAMIN) tablet Take 1 tablet by mouth daily at 12 noon.   pantoprazole (PROTONIX) 40 MG tablet Take 40 mg by mouth daily.   potassium chloride SA (KLOR-CON M) 20 MEQ tablet Take 20 mEq by mouth daily.   Probiotic Product (PROBIOTIC-10 PO) Take 1 capsule by mouth daily.    simvastatin (ZOCOR) 20 MG tablet  Take 20 mg by mouth every evening.   vitamin B-12 (CYANOCOBALAMIN) 1000 MCG tablet Take 1,000 mcg by mouth every morning.   warfarin (COUMADIN) 5 MG tablet Take 2.5-5 mg by mouth every evening. Patient is taking 2.'5mg'$  on Sunday, Tues, [place]. Sat. Patient is taking 5 mg on Monday, Wed, Friday.   No facility-administered encounter medications on file as of 09/13/2022.    Objective:   PHYSICAL EXAMINATION:    VITALS:   There were no vitals filed for this visit.    GEN:  The patient appears stated age and is in NAD. HEENT:  Normocephalic, AT.   Pt wearing helmet.  The mucous membranes are moist. The superficial temporal arteries are without ropiness or tenderness.  She is wearing a soft helmet. CV:  RRR Lungs:  CTAB Neck/HEME:  There are no carotid bruits bilaterally.  Neurological examination:  Orientation: The patient is alert and oriented x3. Cranial nerves: There is good facial symmetry with facial hypomimia. There has upgaze and downgaze paresis.  She c/o diplopia with it.  The speech is fluent and clear. Soft palate rises symmetrically and there is no tongue deviation. Hearing is intact to conversational tone. Sensation: Sensation is intact to light touch throughout Motor: Strength is at least antigravity x4.  Movement examination: Tone: There is normal tone in the UE/LE Abnormal movements: none Coordination:  There is no decremation with RAM's, but she is slow Gait and Station: she pushes off to arise.  She drags the R leg and has some freezing in the doorway and turns.  I have reviewed and interpreted the following labs independently    Chemistry      Component Value Date/Time   NA 141 08/10/2022 0917   K 4.1 08/10/2022 0917   CL 100 08/10/2022 0917   CO2 26 08/10/2022 0917   BUN 29 (H) 08/10/2022 0917   CREATININE 2.02 (H) 08/10/2022 0917      Component Value Date/Time   CALCIUM 10.8 (H) 08/10/2022 0917   ALKPHOS 89 08/10/2022 0917   AST 32 08/10/2022 0917   ALT  11 08/10/2022 0917   BILITOT 0.3 08/10/2022 0917       Lab Results  Component Value Date   WBC 7.1 08/10/2022   HGB 11.9 08/10/2022   HCT 35.1 08/10/2022   MCV 100 (H) 08/10/2022   PLT 255 08/10/2022    Lab Results  Component Value Date   TSH 3.520 08/10/2022     Total time spent on today's visit was *** minutes, including both face-to-face time and nonface-to-face time.  Time included that spent on review of records (prior notes available to me/labs/imaging if pertinent), discussing treatment and goals, answering patient's questions and coordinating care.  Cc:  Donnajean Lopes, MD

## 2022-09-09 DIAGNOSIS — Z7901 Long term (current) use of anticoagulants: Secondary | ICD-10-CM | POA: Diagnosis not present

## 2022-09-09 DIAGNOSIS — I480 Paroxysmal atrial fibrillation: Secondary | ICD-10-CM | POA: Diagnosis not present

## 2022-09-09 DIAGNOSIS — Z952 Presence of prosthetic heart valve: Secondary | ICD-10-CM | POA: Diagnosis not present

## 2022-09-13 ENCOUNTER — Encounter: Payer: Self-pay | Admitting: Neurology

## 2022-09-13 ENCOUNTER — Ambulatory Visit: Payer: PPO | Admitting: Neurology

## 2022-09-13 DIAGNOSIS — Z029 Encounter for administrative examinations, unspecified: Secondary | ICD-10-CM

## 2022-09-13 NOTE — Progress Notes (Signed)
PCP:  Donnajean Lopes, MD Primary Cardiologist: Peter Martinique, MD Electrophysiologist: Cristopher Peru, MD   [name] is a 81 y.o. female seen today for Cristopher Peru, MD for routine electrophysiology followup. Since last being seen in our clinic the patient reports doing about the same. She occasionally feels fluttering, but in general does not feel limited from a cardiac perspective. Most of her issues are related to her PSP (Progressive Supranuclear Palsy).  she denies chest pain, palpitations, dyspnea, PND, orthopnea, nausea, vomiting, dizziness, syncope, edema, weight gain, or early satiety.   Past Medical History:  Diagnosis Date   BPV (benign positional vertigo)    Chronic neck pain    C2-3 arthropathy   CKD (chronic kidney disease), stage II    Diverticulosis of colon    GERD (gastroesophageal reflux disease)    Hiatal hernia    History of breast cancer per pt no recurrence   dx 2004-- Left breast DCIS (ER/PR+, HER2 negative) s/p  partial masectomy w/ sln bx and  chemoradiotion (completed 2004)   History of herpes zoster    History of kidney stones    HTN (hypertension)    Hyperlipidemia    Irritable bowel syndrome    LBBB (left bundle branch block)    PAF (paroxysmal atrial fibrillation) (HCC)    a. CHA2DS2VASc = 4-->coumadin.   Parkinsonism [facility])    neurologist-  dr tat   PONV (postoperative nausea and vomiting)    S/P aortic valve replacement with prosthetic valve    a. 04/13/2006 s/p St Jude mechnical AVR for severe AS;  b. 09/2014 Echo: EF 40-45%, gr1 DD, mild AI/MR, triv TR/PR.   Squamous cell carcinoma of skin 04/06/2022   right nasal sidewall   SUI (stress urinary incontinence, female)    Symptomatic anemia    Venous varices     Past Surgical History:  Procedure Laterality Date   ANTERIOR AND POSTERIOR REPAIR  1990   cystocele/ rectocele   AORTIC VALVE REPLACEMENT  04/13/06   dr gerhardt   and Replacement Ascending Aorta (St. Jude mechanical  prosthesis)   APPENDECTOMY  child 1950   BREAST EXCISIONAL BIOPSY Left    BREAST LUMPECTOMY Left 01/11/2003   malignant   BREAST SURGERY Left 2004   CARDIAC CATHETERIZATION  02-22-2001   dr Martinique   minimal non-obstructive cad/  mild aortic stenosis and aortic root enlargement/  normal LVF, ef 65%   CARDIAC CATHETERIZATION  03-22-2006    dr Martinique   no significant obstructive cad/  severe aortic stenosis and mild to moderate aortic root enlargement/  upper normal right heart pressures   CARDIOVASCULAR STRESS TEST  09-30-2011   dr Martinique   lexiscan nuclear study w/ apical thinning  vs  small prior apical infarct w/ no significant ischemia/  hypokinesis of the distal septum and apex, ef 50%   CARPAL TUNNEL RELEASE Bilateral left 09-09-2004/  right 2007   CATARACT EXTRACTION W/ INTRAOCULAR LENS  IMPLANT, BILATERAL  2015   COLONOSCOPY N/A 10/03/2014   Procedure: COLONOSCOPY;  Surgeon: [name], MD;  Location: [place];  Service: Endoscopy;  Laterality: N/A;   CYSTOSCOPY WITH RETROGRADE PYELOGRAM, URETEROSCOPY AND STENT PLACEMENT Right 09/17/2016   Procedure: CYSTOSCOPY WITH RIGHT RETROGRADE PYELOGRAM, RIGHT URETEROSCOPY AND STENT PLACEMENT LASER LITHOTRIPSY, RIGHT STENT PLACEMENT;  Surgeon: [name], MD;  Location: [facility];  Service: Urology;  Laterality: Right;   ESOPHAGEAL MANOMETRY N/A 08/12/2014   Procedure: ESOPHAGEAL MANOMETRY (EM);  Surgeon: Glendell Docker  Simonne Maffucci, MD;  Location: [facility];  Service: Endoscopy;  Laterality: N/A;   ESOPHAGOGASTRODUODENOSCOPY N/A 10/03/2014   Procedure: ESOPHAGOGASTRODUODENOSCOPY (EGD);  Surgeon: [name], MD;  Location[facility];  Service: Endoscopy;  Laterality: N/A;   ESOPHAGOGASTRODUODENOSCOPY N/A 12/08/2018   Procedure: ESOPHAGOGASTRODUODENOSCOPY (EGD);  Surgeon: Doran Stabler, MD;  Location: [place];  Service: Gastroenterology;  Laterality: N/A;   EXTRACORPOREAL SHOCK WAVE LITHOTRIPSY  1997   FOOT  SURGERY Right 2013   hammertoe x2   HOLMIUM LASER APPLICATION Right 06/28/5008   Procedure: HOLMIUM LASER APPLICATION;  Surgeon: [name], MD;  Location: [facility];  Service: Urology;  Laterality: Right;   PERCUTANEOUS NEPHROSTOLITHOTOMY  1993   STONE EXTRACTION WITH BASKET Right 09/17/2016   Procedure: STONE EXTRACTION WITH BASKET;  Surgeon: [name], MD;  Location: [facility];  Service: Urology;  Laterality: Right;   TOTAL ABDOMINAL HYSTERECTOMY  1971   w/ Bilateral Salpingoophorectomy   TRANSTHORACIC ECHOCARDIOGRAM  10/24/2014   dr Martinique   hypokinesis of the basal and mid inferoseptal and inferior walls,  ef 40-45%,  grade 1 diastolic dysfunction/  mechanical bileaflet aortic valve noted w/ mild regur., peak grandient 14mHg, valve area 1.35cm^2/  severe MV calcification without stenosis w/ mild regurg., peak gradient 361mg/  mild LAE/  poss. atrial mass (MRI showed benign lipomatous hypertrophy of atrial septum) /tFrazier ButtR/mild TR    Current Outpatient Medications  Medication Sig Dispense Refill   amiodarone (PACERONE) 200 MG tablet Take 1 tablet by mouth once daily (Patient taking differently: Take 200 mg by mouth daily. DOES NOT TAKE ON SUNDAYS) 90 tablet 3   calcium carbonate (TUMS - DOSED IN MG ELEMENTAL CALCIUM) 500 MG chewable tablet Chew 1 tablet by mouth daily as needed for indigestion or heartburn.      calcium citrate-vitamin D (CITRACAL+D) 315-200 MG-UNIT per tablet Take 1 tablet by mouth 3 (three) times daily.     carbidopa-levodopa (SINEMET IR) 25-100 MG tablet Take 2 tablets by mouth 3 (three) times daily. 540 tablet 1   Cholecalciferol (VITAMIN D3) 2000 units TABS Take 2,000 Units by mouth daily.      famotidine-calcium carbonate-magnesium hydroxide (PEPCID COMPLETE) 10-800-165 MG CHEW chewable tablet Chew 1 tablet by mouth daily as needed (heartburn).     fexofenadine (ALLEGRA) 60 MG tablet Take 60 mg by mouth at bedtime.      furosemide (LASIX) 20 MG tablet Take 1 tablet by mouth once daily 90 tablet 3   melatonin 5 MG TABS Take 5 mg by mouth at bedtime.     metoprolol tartrate (LOPRESSOR) 25 MG tablet Take 1 tablet (25 mg total) by mouth daily as needed (if heart rate is above 110). 90 tablet 3   Multiple Vitamin (MULTIVITAMIN) tablet Take 1 tablet by mouth daily at 12 noon.     pantoprazole (PROTONIX) 40 MG tablet Take 40 mg by mouth daily.     potassium chloride SA (KLOR-CON M) 20 MEQ tablet Take 20 mEq by mouth daily.     Probiotic Product (PROBIOTIC-10 PO) Take 1 capsule by mouth daily.      simvastatin (ZOCOR) 20 MG tablet Take 20 mg by mouth every evening.     vitamin B-12 (CYANOCOBALAMIN) 1000 MCG tablet Take 1,000 mcg by mouth every morning.     warfarin (COUMADIN) 5 MG tablet Take 2.5-5 mg by mouth every evening. Patient is taking 2.3m70mn Sunday, Tues, ThuFriendlyat. Patient is taking 5 mg on Monday,  Wed, Friday.     No current facility-administered medications for this visit.    Allergies  Allergen Reactions   Demerol [Meperidine] Shortness Of Breath and Swelling   Oxycodone Other (See Comments)    Hallucinations    Adhesive [Tape] Other (See Comments)    Redness and skin tears   Alendronate     Other reaction(s): heartburn   Ivp Dye [Iodinated Contrast Media] Hives    OK with benadryl pre-med (21m one hour before receiving iodinated contrast agent)   Losartan     Other reaction(s): dizzy   Phenergan [Promethazine] Other (See Comments)    Avoids due to reaction with current medications    Social History   Socioeconomic History   Marital status: Married    Spouse name: Not on file   Number of children: 3   Years of education: 11   Highest education level: 11th grade  Occupational History   Occupation: bPress photographer OTHER    Comment: retired  Tobacco Use   Smoking status: Never   Smokeless tobacco: Never  VScientific laboratory technicianUse: Never used  Substance and Sexual  Activity   Alcohol use: No    Alcohol/week: 0.0 standard drinks of alcohol   Drug use: No   Sexual activity: Not on file  Other Topics Concern   Not on file  Social History Narrative      1/2 -1 soda a day     Right handed   Two story home   lives with spouse   Social Determinants of Health   Financial Resource Strain: Not on file  Food Insecurity: Not on file  Transportation Needs: Not on file  Physical Activity: Not on file  Stress: Not on file  Social Connections: Not on file  Intimate Partner Violence: Not on file     Review of Systems: All other systems reviewed and are otherwise negative except as noted above.  Physical Exam: Vitals:   09/21/22 0823  BP: 112/62  Pulse: 98  SpO2: 94%  Weight: 113 lb (51.3 kg)  Height: _0  (1.499 m)    GEN- The patient is well appearing, alert and oriented x 3 today.   HEENT: normocephalic, atraumatic; sclera clear, conjunctiva pink; hearing intact; oropharynx clear; neck supple, no JVP Lymph- no cervical lymphadenopathy Lungs- Clear to ausculation bilaterally, normal work of breathing.  No wheezes, rales, rhonchi Heart- Irregularly irregular rate and rhythm, no murmurs, rubs or gallops, PMI not laterally displaced GI- soft, non-tender, non-distended, bowel sounds present, no hepatosplenomegaly Extremities- No peripheral edema. no clubbing or cyanosis; DP/PT/radial pulses 2+ bilaterally MS- no significant deformity or atrophy Skin- warm and dry, no rash or lesion Psych- euthymic mood, full affect Neuro- strength and sensation are intact  EKG is ordered. Personal review of EKG from today shows AF at 80 bpm  Additional studies reviewed include: Previous EP office notes.   Assessment and Plan:  1. Paroxysmal Atrial Fibrillation  EKG today shows AF at 80 bpm Continue Coumadin for CHA2DS2VASC of at least 6 Continue Amiodarone  200 mg daily except for Sundays.  She is not currently having symptoms of being in atrial  fibrillation.  Long discussion about Risk and benefits of remaining on amiodarone. Currently, she is not having cardiac symptoms or off target effects from amiodarone. They wish to continue current therapy at this time.    2. HTN Stable on current regimen    3. Chronic systolic CHF 4. LBBB Echo 05/2020 LVEF  30-35% NYHA 1 symptoms, though confounded by her progressive neurological disorder   Follow up with Dr. Lovena Le in 6 months  Shirley Friar, PA-C  09/21/22 8:33 AM

## 2022-09-21 ENCOUNTER — Ambulatory Visit: Payer: PPO | Attending: Student | Admitting: Student

## 2022-09-21 VITALS — BP 112/62 | HR 98 | Ht 59.0 in | Wt 113.0 lb

## 2022-09-21 DIAGNOSIS — I10 Essential (primary) hypertension: Secondary | ICD-10-CM

## 2022-09-21 DIAGNOSIS — I480 Paroxysmal atrial fibrillation: Secondary | ICD-10-CM | POA: Diagnosis not present

## 2022-09-21 DIAGNOSIS — I5042 Chronic combined systolic (congestive) and diastolic (congestive) heart failure: Secondary | ICD-10-CM

## 2022-09-21 NOTE — Patient Instructions (Signed)
Medication Instructions:  Your physician recommends that you continue on your current medications as directed. Please refer to the Current Medication list given to you today.  *If you need a refill on your cardiac medications before your next appointment, please call your pharmacy*   Lab Work: None If you have labs (blood work) drawn today and your tests are completely normal, you will receive your results only by: [place] (if you have MyChart) OR A paper copy in the mail If you have any lab test that is abnormal or we need to change your treatment, we will call you to review the results.   Follow-Up: At [facility], you and your health needs are our priority.  As part of our continuing mission to provide you with exceptional heart care, we have created designated Provider Care Teams.  These Care Teams include your primary Cardiologist (physician) and Advanced Practice Providers (APPs -  Physician Assistants and Nurse Practitioners) who all work together to provide you with the care you need, when you need it.   Your next appointment:   6 month(s)  The format for your next appointment:   In Person  Provider:   Cristopher Peru, MD

## 2022-10-07 ENCOUNTER — Other Ambulatory Visit: Payer: Self-pay | Admitting: Neurology

## 2022-10-07 DIAGNOSIS — G231 Progressive supranuclear ophthalmoplegia [Steele-Richardson-Olszewski]: Secondary | ICD-10-CM

## 2022-10-13 DIAGNOSIS — Z952 Presence of prosthetic heart valve: Secondary | ICD-10-CM | POA: Diagnosis not present

## 2022-10-13 DIAGNOSIS — Z23 Encounter for immunization: Secondary | ICD-10-CM | POA: Diagnosis not present

## 2022-10-13 DIAGNOSIS — Z7901 Long term (current) use of anticoagulants: Secondary | ICD-10-CM | POA: Diagnosis not present

## 2022-10-13 DIAGNOSIS — I480 Paroxysmal atrial fibrillation: Secondary | ICD-10-CM | POA: Diagnosis not present

## 2022-10-15 DIAGNOSIS — Z6822 Body mass index (BMI) 22.0-22.9, adult: Secondary | ICD-10-CM | POA: Diagnosis not present

## 2022-10-15 DIAGNOSIS — G231 Progressive supranuclear ophthalmoplegia [Steele-Richardson-Olszewski]: Secondary | ICD-10-CM | POA: Diagnosis not present

## 2022-10-15 DIAGNOSIS — Z9181 History of falling: Secondary | ICD-10-CM | POA: Diagnosis not present

## 2022-11-15 DIAGNOSIS — Z952 Presence of prosthetic heart valve: Secondary | ICD-10-CM | POA: Diagnosis not present

## 2022-11-15 DIAGNOSIS — I480 Paroxysmal atrial fibrillation: Secondary | ICD-10-CM | POA: Diagnosis not present

## 2022-11-15 DIAGNOSIS — Z7901 Long term (current) use of anticoagulants: Secondary | ICD-10-CM | POA: Diagnosis not present

## 2022-11-30 ENCOUNTER — Other Ambulatory Visit (HOSPITAL_BASED_OUTPATIENT_CLINIC_OR_DEPARTMENT_OTHER): Payer: Self-pay

## 2022-11-30 DIAGNOSIS — Z952 Presence of prosthetic heart valve: Secondary | ICD-10-CM | POA: Diagnosis not present

## 2022-11-30 DIAGNOSIS — I480 Paroxysmal atrial fibrillation: Secondary | ICD-10-CM | POA: Diagnosis not present

## 2022-11-30 DIAGNOSIS — Z7901 Long term (current) use of anticoagulants: Secondary | ICD-10-CM | POA: Diagnosis not present

## 2022-11-30 MED ORDER — COVID-19 MRNA VAC-TRIS(PFIZER) 30 MCG/0.3ML IM SUSY
0.3000 mL | PREFILLED_SYRINGE | Freq: Once | INTRAMUSCULAR | 0 refills | Status: AC
Start: 2022-11-30 — End: 2022-12-02
  Filled 2022-11-30: qty 0.3, 1d supply, fill #0

## 2022-12-16 DIAGNOSIS — Z7901 Long term (current) use of anticoagulants: Secondary | ICD-10-CM | POA: Diagnosis not present

## 2022-12-16 DIAGNOSIS — Z952 Presence of prosthetic heart valve: Secondary | ICD-10-CM | POA: Diagnosis not present

## 2022-12-16 DIAGNOSIS — I480 Paroxysmal atrial fibrillation: Secondary | ICD-10-CM | POA: Diagnosis not present

## 2023-01-02 ENCOUNTER — Other Ambulatory Visit: Payer: Self-pay | Admitting: Neurology

## 2023-01-02 DIAGNOSIS — G231 Progressive supranuclear ophthalmoplegia [Steele-Richardson-Olszewski]: Secondary | ICD-10-CM

## 2023-01-18 DIAGNOSIS — I480 Paroxysmal atrial fibrillation: Secondary | ICD-10-CM | POA: Diagnosis not present

## 2023-01-18 DIAGNOSIS — Z952 Presence of prosthetic heart valve: Secondary | ICD-10-CM | POA: Diagnosis not present

## 2023-01-18 DIAGNOSIS — Z7901 Long term (current) use of anticoagulants: Secondary | ICD-10-CM | POA: Diagnosis not present

## 2023-01-21 ENCOUNTER — Other Ambulatory Visit (HOSPITAL_BASED_OUTPATIENT_CLINIC_OR_DEPARTMENT_OTHER): Payer: Self-pay

## 2023-01-21 MED ORDER — SHINGRIX 50 MCG/0.5ML IM SUSR
INTRAMUSCULAR | 1 refills | Status: DC
  Filled 2023-01-21: qty 0.5, 1d supply, fill #0

## 2023-01-25 NOTE — Progress Notes (Signed)
Assessment/Plan:   1.  PSP  -Patient understands differences between PSP and Parkinson's disease.  Understands that there is an increased risk of falls, aspiration as subsequent morbidity and mortality from this disease compared to Parkinson's disease.    -Do not want her walking at all.  She needs to use motorized wheelchair at all times, unless husband is directly holding her.  Have discussed this last many visits.  -long discussion about whether or not to continue carbidopa/levodopa 25/100, 2 po tid.  They ultimately decided to continue it.     2.  Neck pain with cervical dystonia  -This is actually improved with levodopa therapy.  -Her last MRI was in 2017 with evidence of severe C2/C3 left facet arthropathy with joint effusion and bone marrow edema.  She received injections at the time and pain improved.  3.  Insomnia  -can increase melatonin to 8-10 mg at bed  -discussed adding other meds but declined for now.  4.  A-fib  -on coumadin.  Cardiologist said that Coumadin could not be stopped because she had a mechanical valve, but he did recommend lower target range of INR.  However, he said PCP is monitoring/managing that.  5.  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  My previous records were reviewed prior to todays visit as well as outside records available to me.  This patient is accompanied in the office by her spouse who supplements the history.  She was last seen in March, 2023.    We have reiterated to the patient the last many visits that we do not want her walking at all because of the significant fall risk, especially given the fact that she is on Coumadin.  She was in the emergency room in July after a fall backward while going to the bathroom.  She hit her head.  She had a left occipital scalp hematoma, but fortunately, intracranially everything looked good.  Husband states that they have  been very careful since that time and he stays "side by side" with her so she doesn't fall.   She saw cardiology in August and September and she was in A-fib, asymptomatic, and meds were not changed.  Current prescribed movement disorder medications: Carbidopa/levodopa 25/100, 2 tablets 3 times per day    ALLERGIES:   Allergies  Allergen Reactions   Demerol [Meperidine] Shortness Of Breath and Swelling   Oxycodone Other (See Comments)    Hallucinations    Adhesive [Tape] Other (See Comments)    Redness and skin tears   Alendronate     Other reaction(s): heartburn   Ivp Dye [Iodinated Contrast Media] Hives    OK with benadryl pre-med (50mg  one hour before receiving iodinated contrast agent)   Losartan     Other reaction(s): dizzy   Phenergan [Promethazine] Other (See Comments)    Avoids due to reaction with current medications    CURRENT MEDICATIONS:  Outpatient Encounter Medications as of 01/26/2023  Medication Sig   amiodarone (PACERONE) 200 MG tablet Take 1 tablet by mouth once daily (Patient taking differently: Take 200 mg by mouth daily. DOES NOT TAKE ON SUNDAYS)   calcium carbonate (TUMS - DOSED IN MG ELEMENTAL CALCIUM) 500 MG chewable tablet Chew 1 tablet by mouth daily as needed for indigestion or heartburn.    calcium citrate-vitamin D (CITRACAL+D) 315-200 MG-UNIT per tablet Take 1 tablet by mouth 3 (three) times daily.  carbidopa-levodopa (SINEMET IR) 25-100 MG tablet TAKE 2 TABLETS BY MOUTH THREE TIMES DAILY   Cholecalciferol (VITAMIN D3) 2000 units TABS Take 2,000 Units by mouth daily.    famotidine-calcium carbonate-magnesium hydroxide (PEPCID COMPLETE) 10-800-165 MG CHEW chewable tablet Chew 1 tablet by mouth daily as needed (heartburn).   fexofenadine (ALLEGRA) 60 MG tablet Take 60 mg by mouth at bedtime.   furosemide (LASIX) 20 MG tablet Take 1 tablet by mouth once daily   melatonin 5 MG TABS Take 5 mg by mouth at bedtime.   Multiple Vitamin (MULTIVITAMIN) tablet  Take 1 tablet by mouth daily at 12 noon.   pantoprazole (PROTONIX) 40 MG tablet Take 40 mg by mouth daily.   potassium chloride SA (KLOR-CON M) 20 MEQ tablet Take 20 mEq by mouth daily.   Probiotic Product (PROBIOTIC-10 PO) Take 1 capsule by mouth daily.    simvastatin (ZOCOR) 20 MG tablet Take 20 mg by mouth every evening.   vitamin B-12 (CYANOCOBALAMIN) 1000 MCG tablet Take 1,000 mcg by mouth every morning.   warfarin (COUMADIN) 5 MG tablet Take 2.5-5 mg by mouth every evening. Patient is taking 2.5mg  on Sunday, Tues, [place]. Sat. Patient is taking 5 mg on Monday, Wed, Friday.   Zoster Vaccine Adjuvanted [facility]) injection Inject into the muscle.   metoprolol tartrate (LOPRESSOR) 25 MG tablet Take 1 tablet (25 mg total) by mouth daily as needed (if heart rate is above 110).   No facility-administered encounter medications on file as of 01/26/2023.    Objective:   PHYSICAL EXAMINATION:    VITALS:   Vitals:   01/26/23 0807  BP: 118/72  Pulse: 77  SpO2: 96%  Weight: 117 lb 9.6 oz (53.3 kg)  Height: 5' (1.524 m)      GEN:  The patient appears stated age and is in NAD. HEENT:  Normocephalic, AT.   Pt wearing helmet.  The mucous membranes are moist. The superficial temporal arteries are without ropiness or tenderness.  She is wearing a soft helmet.   Neurological examination:  Orientation: The patient is alert and oriented x3. Cranial nerves: There is good facial symmetry with facial hypomimia.  The speech is fluent and clear. Soft palate rises symmetrically and there is no tongue deviation. Hearing is intact to conversational tone. Sensation: Sensation is intact to light touch throughout Motor: Strength is at least antigravity x4.  Movement examination: Tone: There is normal tone in the UE/LE Abnormal movements: frequent myoclonic jerks Coordination:  There is no decremation with RAM's, but she is slow Gait and Station: her husband holds her to ambulate and she is short  stepped and holds the cane.  She shuffles.  I have reviewed and interpreted the following labs independently    Chemistry      Component Value Date/Time   NA 141 08/10/2022 0917   K 4.1 08/10/2022 0917   CL 100 08/10/2022 0917   CO2 26 08/10/2022 0917   BUN 29 (H) 08/10/2022 0917   CREATININE 2.02 (H) 08/10/2022 0917      Component Value Date/Time   CALCIUM 10.8 (H) 08/10/2022 0917   ALKPHOS 89 08/10/2022 0917   AST 32 08/10/2022 0917   ALT 11 08/10/2022 0917   BILITOT 0.3 08/10/2022 0917       Lab Results  Component Value Date   WBC 7.1 08/10/2022   HGB 11.9 08/10/2022   HCT 35.1 08/10/2022   MCV 100 (H) 08/10/2022   PLT 255 08/10/2022    Lab Results  Component  Value Date   TSH 3.520 08/10/2022     Total time spent on today's visit was 30 minutes, including both face-to-face time and nonface-to-face time.  Time included that spent on review of records (prior notes available to me/labs/imaging if pertinent), discussing treatment and goals, answering patient's questions and coordinating care.  Cc:  Garlan Fillers, MD

## 2023-01-26 ENCOUNTER — Ambulatory Visit: Payer: Medicare HMO | Admitting: Neurology

## 2023-01-26 ENCOUNTER — Encounter: Payer: Self-pay | Admitting: Neurology

## 2023-01-26 VITALS — BP 118/72 | HR 77 | Ht 60.0 in | Wt 117.6 lb

## 2023-01-26 DIAGNOSIS — G4701 Insomnia due to medical condition: Secondary | ICD-10-CM

## 2023-01-26 DIAGNOSIS — G231 Progressive supranuclear ophthalmoplegia [Steele-Richardson-Olszewski]: Secondary | ICD-10-CM | POA: Diagnosis not present

## 2023-01-26 DIAGNOSIS — N289 Disorder of kidney and ureter, unspecified: Secondary | ICD-10-CM

## 2023-01-26 DIAGNOSIS — G253 Myoclonus: Secondary | ICD-10-CM

## 2023-01-26 NOTE — Patient Instructions (Signed)
So good to see you!  We discussed decreasing levodopa but decided to hold on that for now.    You can add more melatonin, 8-10 mg at bed.  The physicians and staff at [facility] are committed to providing excellent care. You may receive a survey requesting feedback about your experience at our office. We strive to receive "very good" responses to the survey questions. If you feel that your experience would prevent you from giving the office a "very good " response, please contact our office to try to remedy the situation. We may be reached at [phone]. Thank you for taking the time out of your busy day to complete the survey.

## 2023-01-28 ENCOUNTER — Telehealth: Payer: Self-pay | Admitting: Neurology

## 2023-01-28 NOTE — Telephone Encounter (Signed)
Called patient and they are aware of the slow wean of one pill per week

## 2023-01-28 NOTE — Telephone Encounter (Signed)
Pt called in stating they have decided to go ahead an cut down the dosage of the carbidopa-levodopa. They just would like to go over what a good step down dosage process would be?

## 2023-02-17 DIAGNOSIS — Z7901 Long term (current) use of anticoagulants: Secondary | ICD-10-CM | POA: Diagnosis not present

## 2023-02-17 DIAGNOSIS — I480 Paroxysmal atrial fibrillation: Secondary | ICD-10-CM | POA: Diagnosis not present

## 2023-02-17 DIAGNOSIS — Z01419 Encounter for gynecological examination (general) (routine) without abnormal findings: Secondary | ICD-10-CM | POA: Diagnosis not present

## 2023-02-17 DIAGNOSIS — Z6823 Body mass index (BMI) 23.0-23.9, adult: Secondary | ICD-10-CM | POA: Diagnosis not present

## 2023-02-17 DIAGNOSIS — Z952 Presence of prosthetic heart valve: Secondary | ICD-10-CM | POA: Diagnosis not present

## 2023-02-23 DIAGNOSIS — K219 Gastro-esophageal reflux disease without esophagitis: Secondary | ICD-10-CM | POA: Diagnosis not present

## 2023-02-23 DIAGNOSIS — E785 Hyperlipidemia, unspecified: Secondary | ICD-10-CM | POA: Diagnosis not present

## 2023-02-23 DIAGNOSIS — R609 Edema, unspecified: Secondary | ICD-10-CM | POA: Diagnosis not present

## 2023-02-23 DIAGNOSIS — Z8249 Family history of ischemic heart disease and other diseases of the circulatory system: Secondary | ICD-10-CM | POA: Diagnosis not present

## 2023-02-23 DIAGNOSIS — D6869 Other thrombophilia: Secondary | ICD-10-CM | POA: Diagnosis not present

## 2023-02-23 DIAGNOSIS — E876 Hypokalemia: Secondary | ICD-10-CM | POA: Diagnosis not present

## 2023-02-23 DIAGNOSIS — Z809 Family history of malignant neoplasm, unspecified: Secondary | ICD-10-CM | POA: Diagnosis not present

## 2023-02-23 DIAGNOSIS — Z008 Encounter for other general examination: Secondary | ICD-10-CM | POA: Diagnosis not present

## 2023-02-23 DIAGNOSIS — Z888 Allergy status to other drugs, medicaments and biological substances status: Secondary | ICD-10-CM | POA: Diagnosis not present

## 2023-02-23 DIAGNOSIS — Z885 Allergy status to narcotic agent status: Secondary | ICD-10-CM | POA: Diagnosis not present

## 2023-02-23 DIAGNOSIS — I4891 Unspecified atrial fibrillation: Secondary | ICD-10-CM | POA: Diagnosis not present

## 2023-02-23 DIAGNOSIS — R030 Elevated blood-pressure reading, without diagnosis of hypertension: Secondary | ICD-10-CM | POA: Diagnosis not present

## 2023-02-23 DIAGNOSIS — I951 Orthostatic hypotension: Secondary | ICD-10-CM | POA: Diagnosis not present

## 2023-02-24 ENCOUNTER — Emergency Department (HOSPITAL_COMMUNITY): Payer: Medicare HMO

## 2023-02-24 ENCOUNTER — Other Ambulatory Visit: Payer: Self-pay

## 2023-02-24 ENCOUNTER — Inpatient Hospital Stay (HOSPITAL_COMMUNITY): Payer: Medicare HMO

## 2023-02-24 ENCOUNTER — Inpatient Hospital Stay (HOSPITAL_COMMUNITY)
Admission: EM | Admit: 2023-02-24 | Discharge: 2023-03-08 | DRG: 086 | Disposition: A | Discharge status: Rehabilitation Facility | Payer: Medicare HMO | Attending: Surgery | Admitting: Surgery

## 2023-02-24 DIAGNOSIS — I10 Essential (primary) hypertension: Secondary | ICD-10-CM | POA: Diagnosis not present

## 2023-02-24 DIAGNOSIS — Z9842 Cataract extraction status, left eye: Secondary | ICD-10-CM

## 2023-02-24 DIAGNOSIS — I5042 Chronic combined systolic (congestive) and diastolic (congestive) heart failure: Secondary | ICD-10-CM | POA: Diagnosis not present

## 2023-02-24 DIAGNOSIS — R001 Bradycardia, unspecified: Secondary | ICD-10-CM | POA: Diagnosis present

## 2023-02-24 DIAGNOSIS — M2042 Other hammer toe(s) (acquired), left foot: Secondary | ICD-10-CM | POA: Diagnosis present

## 2023-02-24 DIAGNOSIS — W19XXXA Unspecified fall, initial encounter: Secondary | ICD-10-CM | POA: Diagnosis present

## 2023-02-24 DIAGNOSIS — N183 Chronic kidney disease, stage 3 unspecified: Secondary | ICD-10-CM | POA: Diagnosis present

## 2023-02-24 DIAGNOSIS — S069X1D Unspecified intracranial injury with loss of consciousness of 30 minutes or less, subsequent encounter: Secondary | ICD-10-CM | POA: Diagnosis not present

## 2023-02-24 DIAGNOSIS — W1830XA Fall on same level, unspecified, initial encounter: Secondary | ICD-10-CM | POA: Diagnosis present

## 2023-02-24 DIAGNOSIS — E1122 Type 2 diabetes mellitus with diabetic chronic kidney disease: Secondary | ICD-10-CM | POA: Diagnosis present

## 2023-02-24 DIAGNOSIS — R102 Pelvic and perineal pain: Secondary | ICD-10-CM | POA: Diagnosis not present

## 2023-02-24 DIAGNOSIS — S06340A Traumatic hemorrhage of right cerebrum without loss of consciousness, initial encounter: Secondary | ICD-10-CM | POA: Diagnosis not present

## 2023-02-24 DIAGNOSIS — E785 Hyperlipidemia, unspecified: Secondary | ICD-10-CM | POA: Diagnosis not present

## 2023-02-24 DIAGNOSIS — G249 Dystonia, unspecified: Secondary | ICD-10-CM | POA: Diagnosis not present

## 2023-02-24 DIAGNOSIS — Z8249 Family history of ischemic heart disease and other diseases of the circulatory system: Secondary | ICD-10-CM

## 2023-02-24 DIAGNOSIS — G231 Progressive supranuclear ophthalmoplegia [Steele-Richardson-Olszewski]: Secondary | ICD-10-CM | POA: Diagnosis present

## 2023-02-24 DIAGNOSIS — M25512 Pain in left shoulder: Secondary | ICD-10-CM | POA: Diagnosis not present

## 2023-02-24 DIAGNOSIS — I447 Left bundle-branch block, unspecified: Secondary | ICD-10-CM | POA: Diagnosis present

## 2023-02-24 DIAGNOSIS — Z952 Presence of prosthetic heart valve: Secondary | ICD-10-CM

## 2023-02-24 DIAGNOSIS — I6200 Nontraumatic subdural hemorrhage, unspecified: Secondary | ICD-10-CM

## 2023-02-24 DIAGNOSIS — I130 Hypertensive heart and chronic kidney disease with heart failure and stage 1 through stage 4 chronic kidney disease, or unspecified chronic kidney disease: Secondary | ICD-10-CM | POA: Diagnosis present

## 2023-02-24 DIAGNOSIS — S065XAA Traumatic subdural hemorrhage with loss of consciousness status unknown, initial encounter: Secondary | ICD-10-CM | POA: Diagnosis not present

## 2023-02-24 DIAGNOSIS — Z87442 Personal history of urinary calculi: Secondary | ICD-10-CM

## 2023-02-24 DIAGNOSIS — S0083XA Contusion of other part of head, initial encounter: Secondary | ICD-10-CM

## 2023-02-24 DIAGNOSIS — Z043 Encounter for examination and observation following other accident: Secondary | ICD-10-CM | POA: Diagnosis not present

## 2023-02-24 DIAGNOSIS — Z9079 Acquired absence of other genital organ(s): Secondary | ICD-10-CM

## 2023-02-24 DIAGNOSIS — N1330 Unspecified hydronephrosis: Secondary | ICD-10-CM | POA: Diagnosis not present

## 2023-02-24 DIAGNOSIS — R402252 Coma scale, best verbal response, oriented, at arrival to emergency department: Secondary | ICD-10-CM | POA: Diagnosis not present

## 2023-02-24 DIAGNOSIS — Z961 Presence of intraocular lens: Secondary | ICD-10-CM | POA: Diagnosis present

## 2023-02-24 DIAGNOSIS — M542 Cervicalgia: Secondary | ICD-10-CM | POA: Diagnosis not present

## 2023-02-24 DIAGNOSIS — S199XXA Unspecified injury of neck, initial encounter: Secondary | ICD-10-CM | POA: Diagnosis not present

## 2023-02-24 DIAGNOSIS — I480 Paroxysmal atrial fibrillation: Secondary | ICD-10-CM | POA: Diagnosis not present

## 2023-02-24 DIAGNOSIS — Z79899 Other long term (current) drug therapy: Secondary | ICD-10-CM

## 2023-02-24 DIAGNOSIS — S069X9D Unspecified intracranial injury with loss of consciousness of unspecified duration, subsequent encounter: Secondary | ICD-10-CM | POA: Diagnosis not present

## 2023-02-24 DIAGNOSIS — Z888 Allergy status to other drugs, medicaments and biological substances status: Secondary | ICD-10-CM

## 2023-02-24 DIAGNOSIS — S0181XA Laceration without foreign body of other part of head, initial encounter: Secondary | ICD-10-CM | POA: Diagnosis not present

## 2023-02-24 DIAGNOSIS — Z91041 Radiographic dye allergy status: Secondary | ICD-10-CM | POA: Diagnosis not present

## 2023-02-24 DIAGNOSIS — R339 Retention of urine, unspecified: Secondary | ICD-10-CM | POA: Diagnosis not present

## 2023-02-24 DIAGNOSIS — Z85828 Personal history of other malignant neoplasm of skin: Secondary | ICD-10-CM

## 2023-02-24 DIAGNOSIS — S065X0A Traumatic subdural hemorrhage without loss of consciousness, initial encounter: Principal | ICD-10-CM | POA: Diagnosis present

## 2023-02-24 DIAGNOSIS — R079 Chest pain, unspecified: Secondary | ICD-10-CM | POA: Diagnosis not present

## 2023-02-24 DIAGNOSIS — K219 Gastro-esophageal reflux disease without esophagitis: Secondary | ICD-10-CM | POA: Diagnosis present

## 2023-02-24 DIAGNOSIS — G20C Parkinsonism, unspecified: Secondary | ICD-10-CM | POA: Diagnosis present

## 2023-02-24 DIAGNOSIS — I359 Nonrheumatic aortic valve disorder, unspecified: Secondary | ICD-10-CM | POA: Diagnosis not present

## 2023-02-24 DIAGNOSIS — R296 Repeated falls: Secondary | ICD-10-CM | POA: Diagnosis present

## 2023-02-24 DIAGNOSIS — M47812 Spondylosis without myelopathy or radiculopathy, cervical region: Secondary | ICD-10-CM | POA: Diagnosis present

## 2023-02-24 DIAGNOSIS — D62 Acute posthemorrhagic anemia: Secondary | ICD-10-CM | POA: Diagnosis not present

## 2023-02-24 DIAGNOSIS — S065X1A Traumatic subdural hemorrhage with loss of consciousness of 30 minutes or less, initial encounter: Secondary | ICD-10-CM | POA: Diagnosis not present

## 2023-02-24 DIAGNOSIS — Z885 Allergy status to narcotic agent status: Secondary | ICD-10-CM

## 2023-02-24 DIAGNOSIS — G4700 Insomnia, unspecified: Secondary | ICD-10-CM | POA: Diagnosis not present

## 2023-02-24 DIAGNOSIS — N182 Chronic kidney disease, stage 2 (mild): Secondary | ICD-10-CM | POA: Diagnosis not present

## 2023-02-24 DIAGNOSIS — Z9841 Cataract extraction status, right eye: Secondary | ICD-10-CM

## 2023-02-24 DIAGNOSIS — Z923 Personal history of irradiation: Secondary | ICD-10-CM

## 2023-02-24 DIAGNOSIS — S42002A Fracture of unspecified part of left clavicle, initial encounter for closed fracture: Secondary | ICD-10-CM | POA: Diagnosis not present

## 2023-02-24 DIAGNOSIS — Z7901 Long term (current) use of anticoagulants: Secondary | ICD-10-CM

## 2023-02-24 DIAGNOSIS — S42032A Displaced fracture of lateral end of left clavicle, initial encounter for closed fracture: Secondary | ICD-10-CM | POA: Diagnosis present

## 2023-02-24 DIAGNOSIS — R402362 Coma scale, best motor response, obeys commands, at arrival to emergency department: Secondary | ICD-10-CM | POA: Diagnosis not present

## 2023-02-24 DIAGNOSIS — R0789 Other chest pain: Secondary | ICD-10-CM | POA: Diagnosis not present

## 2023-02-24 DIAGNOSIS — S069XAS Unspecified intracranial injury with loss of consciousness status unknown, sequela: Secondary | ICD-10-CM | POA: Diagnosis not present

## 2023-02-24 DIAGNOSIS — S0101XA Laceration without foreign body of scalp, initial encounter: Secondary | ICD-10-CM | POA: Diagnosis present

## 2023-02-24 DIAGNOSIS — Y92481 Parking lot as the place of occurrence of the external cause: Secondary | ICD-10-CM | POA: Diagnosis not present

## 2023-02-24 DIAGNOSIS — Z79890 Hormone replacement therapy (postmenopausal): Secondary | ICD-10-CM

## 2023-02-24 DIAGNOSIS — Z90722 Acquired absence of ovaries, bilateral: Secondary | ICD-10-CM

## 2023-02-24 DIAGNOSIS — Z853 Personal history of malignant neoplasm of breast: Secondary | ICD-10-CM | POA: Diagnosis not present

## 2023-02-24 DIAGNOSIS — I5022 Chronic systolic (congestive) heart failure: Secondary | ICD-10-CM | POA: Diagnosis not present

## 2023-02-24 DIAGNOSIS — R402142 Coma scale, eyes open, spontaneous, at arrival to emergency department: Secondary | ICD-10-CM | POA: Diagnosis present

## 2023-02-24 DIAGNOSIS — S069XAA Unspecified intracranial injury with loss of consciousness status unknown, initial encounter: Secondary | ICD-10-CM | POA: Diagnosis not present

## 2023-02-24 DIAGNOSIS — Z9221 Personal history of antineoplastic chemotherapy: Secondary | ICD-10-CM | POA: Diagnosis not present

## 2023-02-24 DIAGNOSIS — R519 Headache, unspecified: Secondary | ICD-10-CM | POA: Diagnosis not present

## 2023-02-24 DIAGNOSIS — Z90710 Acquired absence of both cervix and uterus: Secondary | ICD-10-CM

## 2023-02-24 DIAGNOSIS — M2041 Other hammer toe(s) (acquired), right foot: Secondary | ICD-10-CM | POA: Diagnosis present

## 2023-02-24 LAB — ETHANOL: Alcohol, Ethyl (B): <10 mg/dL (ref <10)

## 2023-02-24 LAB — I-STAT CHEM 8, ED
BUN: 38 mg/dL — ABNORMAL HIGH (ref 8–23)
Calcium, Ion: 1.26 mmol/L (ref 1.15–1.40)
Chloride: 99 mmol/L (ref 98–111)
Creatinine, Ser: 2.4 mg/dL — ABNORMAL HIGH (ref 0.44–1.00)
Glucose, Bld: 105 mg/dL — ABNORMAL HIGH (ref 70–99)
HCT: 34 % — ABNORMAL LOW (ref 36.0–46.0)
Hemoglobin: 11.6 g/dL — ABNORMAL LOW (ref 12.0–15.0)
Potassium: 4 mmol/L (ref 3.5–5.1)
Sodium: 137 mmol/L (ref 135–145)
TCO2: 32 mmol/L (ref 22–32)

## 2023-02-24 LAB — COMPREHENSIVE METABOLIC PANEL
ALT: 15 U/L (ref 0–44)
AST: 30 U/L (ref 15–41)
Albumin: 3.5 g/dL (ref 3.5–5.0)
Alkaline Phosphatase: 75 U/L (ref 38–126)
Anion gap: 11 (ref 5–15)
BUN: 33 mg/dL — ABNORMAL HIGH (ref 8–23)
CO2: 28 mmol/L (ref 22–32)
Calcium: 10.1 mg/dL (ref 8.9–10.3)
Chloride: 98 mmol/L (ref 98–111)
Creatinine, Ser: 2.02 mg/dL — ABNORMAL HIGH (ref 0.44–1.00)
GFR, Estimated: 24 mL/min — ABNORMAL LOW (ref >60)
Glucose, Bld: 111 mg/dL — ABNORMAL HIGH (ref 70–99)
Potassium: 3.9 mmol/L (ref 3.5–5.1)
Sodium: 137 mmol/L (ref 135–145)
Total Bilirubin: 0.4 mg/dL (ref 0.3–1.2)
Total Protein: 6.5 g/dL (ref 6.5–8.1)

## 2023-02-24 LAB — CBC
HCT: 35.3 % — ABNORMAL LOW (ref 36.0–46.0)
Hemoglobin: 11.2 g/dL — ABNORMAL LOW (ref 12.0–15.0)
MCH: 33.2 pg (ref 26.0–34.0)
MCHC: 31.7 g/dL (ref 30.0–36.0)
MCV: 104.7 fL — ABNORMAL HIGH (ref 80.0–100.0)
Platelets: 202 10*3/uL (ref 150–400)
RBC: 3.37 MIL/uL — ABNORMAL LOW (ref 3.87–5.11)
RDW: 13.6 % (ref 11.5–15.5)
WBC: 7.6 10*3/uL (ref 4.0–10.5)
nRBC: 0 % (ref 0.0–0.2)

## 2023-02-24 LAB — TYPE AND SCREEN
ABO/RH(D): O POS
Antibody Screen: NEGATIVE

## 2023-02-24 LAB — PROTIME-INR
INR: 2.4 — ABNORMAL HIGH (ref 0.8–1.2)
Prothrombin Time: 26 seconds — ABNORMAL HIGH (ref 11.4–15.2)

## 2023-02-24 LAB — LACTIC ACID, PLASMA: Lactic Acid, Venous: 1.2 mmol/L (ref 0.5–1.9)

## 2023-02-24 MED ORDER — MELATONIN 3 MG PO TABS
3.0000 mg | ORAL_TABLET | Freq: Every evening | ORAL | Status: DC | PRN
  Administered 2023-02-26 – 2023-03-07 (×9): 3 mg via ORAL
  Filled 2023-02-24 (×9): qty 1

## 2023-02-24 MED ORDER — TRAMADOL HCL 50 MG PO TABS
50.0000 mg | ORAL_TABLET | Freq: Four times a day (QID) | ORAL | Status: DC | PRN
  Administered 2023-02-27 – 2023-03-04 (×7): 50 mg via ORAL
  Filled 2023-02-24 (×7): qty 1

## 2023-02-24 MED ORDER — ACETAMINOPHEN 325 MG PO TABS
650.0000 mg | ORAL_TABLET | Freq: Four times a day (QID) | ORAL | Status: DC
  Administered 2023-02-24 – 2023-03-06 (×37): 650 mg via ORAL
  Filled 2023-02-24 (×39): qty 2

## 2023-02-24 MED ORDER — METHOCARBAMOL 1000 MG/10ML IJ SOLN: 500.0000 mg | Freq: Three times a day (TID) | INTRAVENOUS | Status: DC | PRN

## 2023-02-24 MED ORDER — LEVETIRACETAM 500 MG PO TABS
500.0000 mg | ORAL_TABLET | Freq: Two times a day (BID) | ORAL | Status: AC
  Administered 2023-02-24 – 2023-03-02 (×14): 500 mg via ORAL
  Filled 2023-02-24 (×14): qty 1

## 2023-02-24 MED ORDER — LACTATED RINGERS IV SOLN: INTRAVENOUS | Status: DC

## 2023-02-24 MED ORDER — MORPHINE SULFATE (PF) 2 MG/ML IV SOLN: 1.0000 mg | INTRAVENOUS | Status: DC | PRN

## 2023-02-24 MED ORDER — METHOCARBAMOL 500 MG PO TABS
500.0000 mg | ORAL_TABLET | Freq: Three times a day (TID) | ORAL | Status: DC | PRN
  Administered 2023-02-27 – 2023-03-04 (×5): 500 mg via ORAL
  Filled 2023-02-24 (×6): qty 1

## 2023-02-24 MED ORDER — MORPHINE SULFATE (PF) 2 MG/ML IV SOLN: 2.0000 mg | INTRAVENOUS | Status: DC | PRN

## 2023-02-24 MED ORDER — DOCUSATE SODIUM 100 MG PO CAPS
100.0000 mg | ORAL_CAPSULE | Freq: Two times a day (BID) | ORAL | Status: DC
  Administered 2023-02-24 – 2023-03-08 (×23): 100 mg via ORAL
  Filled 2023-02-24 (×25): qty 1

## 2023-02-24 MED ORDER — ONDANSETRON HCL 4 MG/2ML IJ SOLN: 4.0000 mg | Freq: Four times a day (QID) | INTRAMUSCULAR | Status: DC | PRN

## 2023-02-24 MED ORDER — FENTANYL CITRATE PF 50 MCG/ML IJ SOSY
50.0000 ug | PREFILLED_SYRINGE | Freq: Once | INTRAMUSCULAR | Status: AC
  Administered 2023-02-24: 50 ug via INTRAVENOUS
  Filled 2023-02-24: qty 1

## 2023-02-24 MED ORDER — ONDANSETRON 4 MG PO TBDP: 4.0000 mg | ORAL_TABLET | Freq: Four times a day (QID) | ORAL | Status: DC | PRN

## 2023-02-24 NOTE — ED Notes (Signed)
Patient transported to X-ray 

## 2023-02-24 NOTE — ED Provider Notes (Signed)
[facility] Provider Note   CSN: [id number] Arrival date & time: 02/24/23  1048     History  Chief Complaint  Patient presents with   [name] is a 82 y.o. female.  The history is provided by the patient and medical records. No language interpreter was used.  Fall This is a new problem. The current episode started 1 to 2 hours ago. The problem has not changed since onset.Associated symptoms include headaches. Pertinent negatives include no chest pain, no abdominal pain and no shortness of breath. Nothing aggravates the symptoms. Nothing relieves the symptoms. She has tried nothing for the symptoms. The treatment provided no relief.       Home Medications Prior to Admission medications   Medication Sig Start Date End Date Taking? Authorizing Provider  amiodarone (PACERONE) 200 MG tablet Take 1 tablet by mouth once daily Patient taking differently: Take 200 mg by mouth daily. DOES NOT TAKE ON SUNDAYS 04/19/22   Deberah Pelton, NP  calcium carbonate (TUMS - DOSED IN MG ELEMENTAL CALCIUM) 500 MG chewable tablet Chew 1 tablet by mouth daily as needed for indigestion or heartburn.     [provider]  calcium citrate-vitamin D (CITRACAL+D) 315-200 MG-UNIT per tablet Take 1 tablet by mouth 3 (three) times daily.    [provider]  carbidopa-levodopa (SINEMET IR) 25-100 MG tablet TAKE 2 TABLETS BY MOUTH THREE TIMES DAILY 01/03/23   Tat, Eustace Quail, DO  Cholecalciferol (VITAMIN D3) 2000 units TABS Take 2,000 Units by mouth daily.     [provider]  famotidine-calcium carbonate-magnesium hydroxide (PEPCID COMPLETE) 10-800-165 MG CHEW chewable tablet Chew 1 tablet by mouth daily as needed (heartburn).    [provider]  fexofenadine (ALLEGRA) 60 MG tablet Take 60 mg by mouth at bedtime.    [provider]  furosemide (LASIX) 20 MG tablet Take 1 tablet by mouth once daily 04/21/22    Martinique, Peter M, MD  melatonin 5 MG TABS Take 5 mg by mouth at bedtime.    [provider]  metoprolol tartrate (LOPRESSOR) 25 MG tablet Take 1 tablet (25 mg total) by mouth daily as needed (if heart rate is above 110). 11/13/20 09/21/22  Martin[place], Peter M, MD  Multiple Vitamin (MULTIVITAMIN) tablet Take 1 tablet by mouth daily at 12 noon.    [provider]  pantoprazole (PROTONIX) 40 MG tablet Take 40 mg by mouth daily. 06/07/17   [provider]  potassium chloride SA (KLOR-CON M) 20 MEQ tablet Take 20 mEq by mouth daily.    [provider]  Probiotic Product (PROBIOTIC-10 PO) Take 1 capsule by mouth daily.     [provider]  simvastatin (ZOCOR) 20 MG tablet Take 20 mg by mouth every evening.    [provider]  vitamin B-12 (CYANOCOBALAMIN) 1000 MCG tablet Take 1,000 mcg by mouth every morning.    [provider]  warfarin (COUMADIN) 5 MG tablet Take 2.5-5 mg by mouth every evening. Patient is taking 2.'5mg'$  on Sunday, Tues, [place]. Sat. Patient is taking 5 mg on Monday, Wed, Friday.    [provider]  Zoster Vaccine Adjuvanted [facility]) injection Inject into the muscle. 01/21/23   Carlyle Basques, MD      Allergies    Demerol [meperidine], Oxycodone, Adhesive [tape], Alendronate, Ivp dye [iodinated contrast media], Losartan, and Phenergan [promethazine]    Review of Systems   Review of Systems  Constitutional:  Negative for chills, fatigue and fever.  HENT:  Negative for congestion.   Eyes:  Negative for visual disturbance.  Respiratory:  Negative for cough, chest tightness, shortness of breath and wheezing.   Cardiovascular:  Negative for chest pain and palpitations.  Gastrointestinal:  Negative for abdominal pain.  Genitourinary:  Negative for dysuria.  Musculoskeletal:  Positive for neck pain. Negative for arthralgias, back pain and neck stiffness.  Skin:  Positive for wound.  Neurological:  Positive for headaches.  Negative for dizziness, weakness and light-headedness.  Psychiatric/Behavioral:  Negative for agitation and confusion.   All other systems reviewed and are negative.   Physical Exam Updated Vital Signs BP (!) 166/54   Pulse (!) 51   Temp 98.2 F (36.8 C) (Oral)   Resp 16   SpO2 98%  Physical Exam Vitals and nursing note reviewed.  Constitutional:      General: She is not in acute distress.    Appearance: She is well-developed. She is not ill-appearing, toxic-appearing or diaphoretic.  HENT:     Head: Contusion and laceration present.      Right Ear: External ear normal.     Left Ear: External ear normal.     Nose: Nose normal. No congestion or rhinorrhea.     Mouth/Throat:     Pharynx: No oropharyngeal exudate or posterior oropharyngeal erythema.  Eyes:     Extraocular Movements: Extraocular movements intact.     Conjunctiva/sclera: Conjunctivae normal.     Pupils: Pupils are equal, round, and reactive to light.  Pulmonary:     Effort: No respiratory distress.     Breath sounds: No stridor.  Abdominal:     General: There is no distension.     Tenderness: There is no abdominal tenderness. There is no guarding or rebound.  Musculoskeletal:        General: Tenderness and signs of injury present.     Cervical back: Normal range of motion and neck supple.  Skin:    General: Skin is warm.     Findings: No erythema or rash.  Neurological:     Mental Status: She is alert and oriented to person, place, and time.     Sensory: No sensory deficit.     Motor: No weakness or abnormal muscle tone.     Deep Tendon Reflexes: Reflexes are normal and symmetric.  Psychiatric:        Mood and Affect: Mood normal.     ED Results / Procedures / Treatments   Labs (all labs ordered are listed, but only abnormal results are displayed) Labs Reviewed  COMPREHENSIVE METABOLIC PANEL - Abnormal; Notable for the following components:      Result Value   Glucose, Bld 111 (*)    BUN 33 (*)     Creatinine, Ser 2.02 (*)    GFR, Estimated 24 (*)    All other components within normal limits  CBC - Abnormal; Notable for the following components:   RBC 3.37 (*)    Hemoglobin 11.2 (*)    HCT 35.3 (*)    MCV 104.7 (*)    All other components within normal limits  PROTIME-INR - Abnormal; Notable for the following components:   Prothrombin Time 26.0 (*)    INR 2.4 (*)    All other components within normal limits  I-STAT CHEM 8, ED - Abnormal; Notable for the following components:   BUN 38 (*)    Creatinine, Ser 2.40 (*)    Glucose, Bld 105 (*)  Hemoglobin 11.6 (*)    HCT 34.0 (*)    All other components within normal limits  ETHANOL  LACTIC ACID, PLASMA  TYPE AND SCREEN    EKG None  Radiology No results found.  Procedures .Marland KitchenLaceration Repair  Date/Time: 02/24/2023 11:34 AM  Performed by: Courtney Paris, MD Authorized by: Courtney Paris, MD   Consent:    Consent obtained:  Emergent situation   Consent given by:  Patient   Risks discussed:  Pain and poor cosmetic result Universal protocol:    Immediately prior to procedure, a time out was called: yes     Patient identity confirmed:  Verbally with patient Anesthesia:    Anesthesia method:  None Laceration details:    Location:  Face   Face location:  Forehead   Length (cm):  0.3   Depth (mm):  2 Pre-procedure details:    Preparation:  Patient was prepped and draped in usual sterile fashion Exploration:    Limited defect created (wound extended): no     Wound exploration: wound explored through full range of motion and entire depth of wound visualized     Contaminated: no   Skin repair:    Repair method:  Sutures   Suture size:  4-0   Suture material:  Nylon   Suture technique:  Figure eight   Number of sutures:  1 Approximation:    Approximation:  Close Repair type:    Repair type:  Intermediate Post-procedure details:    Dressing:  Open (no dressing)   Procedure completion:   Tolerated Comments:     Patient tolerated the suture however it did not seem to stop the bleeding so     CRITICAL CARE Performed by: Gwenyth Allegra Lee-Anne Flicker Total critical care time: 40 minutes Critical care time was exclusive of separately billable procedures and treating other patients. Critical care was necessary to treat or prevent imminent or life-threatening deterioration. Critical care was time spent personally by me on the following activities: development of treatment plan with patient and/or surrogate as well as nursing, discussions with consultants, evaluation of patient's response to treatment, examination of patient, obtaining history from patient or surrogate, ordering and performing treatments and interventions, ordering and review of laboratory studies, ordering and review of radiographic studies, pulse oximetry and re-evaluation of patient's condition.   Medications Ordered in ED Medications  fentaNYL (SUBLIMAZE) injection 50 mcg (50 mcg Intravenous Given 02/24/23 1130)    ED Course/ Medical Decision Making/ A&P                             Medical Decision Making Amount and/or Complexity of Data Reviewed Labs: ordered. Radiology: ordered.  Risk Prescription drug management. Decision regarding hospitalization.    [name] is a 82 y.o. female history significant for hypertension, GERD, CKD, paroxysmal atrial fibrillation and previous mechanical aortic valve replacement on Coumadin therapy, previous breast cancer, and hyperlipidemia who presents for fall.  According to patient, she was getting out of her car today when she tripped and fell and hit her right forehead on the ground.  She is complaining some headache, neck pain but denies any other injuries.  Denies loss of consciousness and denies nausea or vomiting.  Denies vision changes.  She reports she has had bleeding out of the right side of her forehead that has not stopped since onset.  She is on blood  thinners for her mechanical valve and A-fib.  Otherwise  she denies any preceding symptoms such as fevers, chills, congestion, cough, nausea, vomiting, constipation, diarrhea, or urinary changes.  Denies any other vision changes or nausea at this time.  Denies neurologic complaints.  On arrival, airway is intact.  Breath sounds equal bilaterally.  She is not hypotensive but she is still actively having pulsatile likely arterial bleeding from her right forehead.  Before moving on we will try to achieve hemostasis.  Initially, a figure-of-eight suture was placed by me that seem to stop some of the bleeding but her forehead hematoma began to rapidly expand covering several centimeters.  I held pressure and we called trauma surgery who came to the bedside.  Georganna Skeans came and remove the suture I placed and placed a larger running suture.  The hematoma again expanded but the bleeding seems to not be coming out of her skin at this time.  We will get CT of the head and neck and a portable chest x-ray and screening labs.  Will get INR.  Given the amount of blood she likely lost at the scene and here we will likely need to trend her hemoglobin to determine disposition.  Anticipate reassessment after imaging.    CT head revealed acute subdural.  Given her blood thinner need from mechanical valve and A-fib, cardiology was consulted to come see patient.  Trauma will admit for further management of injury with subdural and blood loss with external hematoma.         Final Clinical Impression(s) / ED Diagnoses Final diagnoses:  Fall, initial encounter  Laceration of forehead, initial encounter  Subdural bleeding ([place])  Traumatic hematoma of forehead, initial encounter     Clinical Impression: 1. Fall, initial encounter   2. Laceration of forehead, initial encounter   3. Subdural bleeding ([place])   4. Traumatic hematoma of forehead, initial encounter     Disposition: Admit  This note was  prepared with assistance of Dragon voice recognition software. Occasional wrong-word or sound-a-like substitutions may have occurred due to the inherent limitations of voice recognition software.     Devan Danzer, Gwenyth Allegra, MD 02/24/23 [id number]

## 2023-02-24 NOTE — Consult Note (Signed)
Reason for Consult:Left clavicle fx Referring Physician: Georganna Skeans Time called: [id number] Time at bedside: [place] [name] is an 82 y.o. female.  HPI: [name] was getting out of her car without assistance when she lost her balance and fell. She had a large head laceration and some shoulder pain. She was brought to the ED as a trauma activation 2/2 fall on blood thinners. X-ray showed a distal left clavicle fx and orthopedic surgery was consulted. She is RHD and lives at home with her husband.  Past Medical History:  Diagnosis Date   BPV (benign positional vertigo)    Chronic neck pain    C2-3 arthropathy   CKD (chronic kidney disease), stage II    Diverticulosis of colon    GERD (gastroesophageal reflux disease)    Hiatal hernia    History of breast cancer per pt no recurrence   dx 2004-- Left breast DCIS (ER/PR+, HER2 negative) s/p  partial masectomy w/ sln bx and  chemoradiotion (completed 2004)   History of herpes zoster    History of kidney stones    HTN (hypertension)    Hyperlipidemia    Irritable bowel syndrome    LBBB (left bundle branch block)    PAF (paroxysmal atrial fibrillation) (HCC)    a. CHA2DS2VASc = 4-->coumadin.   Parkinsonism    neurologist-  dr tat   PONV (postoperative nausea and vomiting)    S/P aortic valve replacement with prosthetic valve    a. 04/13/2006 s/p St Jude mechnical AVR for severe AS;  b. 09/2014 Echo: EF 40-45%, gr1 DD, mild AI/MR, triv TR/PR.   Squamous cell carcinoma of skin 04/06/2022   right nasal sidewall   SUI (stress urinary incontinence, female)    Symptomatic anemia    Venous varices      Past Surgical History:  Procedure Laterality Date   ANTERIOR AND POSTERIOR REPAIR  1990   cystocele/ rectocele   AORTIC VALVE REPLACEMENT  04/13/06   dr gerhardt   and Replacement Ascending Aorta (St. Jude mechanical prosthesis)   APPENDECTOMY  child 1950   BREAST EXCISIONAL BIOPSY Left    BREAST LUMPECTOMY Left 01/11/2003    malignant   BREAST SURGERY Left 2004   CARDIAC CATHETERIZATION  02-22-2001   dr Martinique   minimal non-obstructive cad/  mild aortic stenosis and aortic root enlargement/  normal LVF, ef 65%   CARDIAC CATHETERIZATION  03-22-2006    dr Martinique   no significant obstructive cad/  severe aortic stenosis and mild to moderate aortic root enlargement/  upper normal right heart pressures   CARDIOVASCULAR STRESS TEST  09-30-2011   dr Martinique   lexiscan nuclear study w/ apical thinning  vs  small prior apical infarct w/ no significant ischemia/  hypokinesis of the distal septum and apex, ef 50%   CARPAL TUNNEL RELEASE Bilateral left 09-09-2004/  right 2007   CATARACT EXTRACTION W/ INTRAOCULAR LENS  IMPLANT, BILATERAL  2015   COLONOSCOPY N/A 10/03/2014   Procedure: COLONOSCOPY;  Surgeon: [name], MD;  Location: [facility];  Service: Endoscopy;  Laterality: N/A;   CYSTOSCOPY WITH RETROGRADE PYELOGRAM, URETEROSCOPY AND STENT PLACEMENT Right 09/17/2016   Procedure: CYSTOSCOPY WITH RIGHT RETROGRADE PYELOGRAM, RIGHT URETEROSCOPY AND STENT PLACEMENT LASER LITHOTRIPSY, RIGHT STENT PLACEMENT;  Surgeon: [name], MD;  Location: [facility];  Service: Urology;  Laterality: Right;   ESOPHAGEAL MANOMETRY N/A 08/12/2014   Procedure: ESOPHAGEAL MANOMETRY (EM);  Surgeon: [name], MD;  Location: [facility];  Service: Endoscopy;  Laterality: N/A;   ESOPHAGOGASTRODUODENOSCOPY N/A 10/03/2014   Procedure: ESOPHAGOGASTRODUODENOSCOPY (EGD);  Surgeon: [name], MD;  Location: [facility] ENDOSCOPY;  Service: Endoscopy;  Laterality: N/A;   ESOPHAGOGASTRODUODENOSCOPY N/A 12/08/2018   Procedure: ESOPHAGOGASTRODUODENOSCOPY (EGD);  Surgeon: Doran Stabler, MD;  Location: [facility];  Service: Gastroenterology;  Laterality: N/A;   EXTRACORPOREAL SHOCK WAVE LITHOTRIPSY  1997   FOOT SURGERY Right 2013   hammertoe x2   HOLMIUM LASER APPLICATION Right [id number]   Procedure: HOLMIUM LASER  APPLICATION;  Surgeon: [name], MD;  Location: [facility];  Service: Urology;  Laterality: Right;   PERCUTANEOUS NEPHROSTOLITHOTOMY  1993   STONE EXTRACTION WITH BASKET Right 09/17/2016   Procedure: STONE EXTRACTION WITH BASKET;  Surgeon: [name], MD;  Location: [facility];  Service: Urology;  Laterality: Right;   TOTAL ABDOMINAL HYSTERECTOMY  1971   w/ Bilateral Salpingoophorectomy   TRANSTHORACIC ECHOCARDIOGRAM  10/24/2014   dr Martinique   hypokinesis of the basal and mid inferoseptal and inferior walls,  ef 40-45%,  grade 1 diastolic dysfunction/  mechanical bileaflet aortic valve noted w/ mild regur., peak grandient 52mHg, valve area 1.35cm^2/  severe MV calcification without stenosis w/ mild regurg., peak gradient 339mg/  mild LAE/  poss. atrial mass (MRI showed benign lipomatous hypertrophy of atrial septum) /tFrazier ButtR/mild TR    Family History  Problem Relation Age of Onset   Heart disease Mother    Heart disease Father [id number]     CABG   Other Brother        AVR   Healthy Child     Social History:  reports that she has never smoked. She has never used smokeless tobacco. She reports that she does not drink alcohol and does not use drugs.  Allergies:  Allergies  Allergen Reactions   Demerol [Meperidine] Shortness Of Breath and Swelling   Oxycodone Other (See Comments)    Hallucinations    Adhesive [Tape] Other (See Comments)    Redness and skin tears   Alendronate     Other reaction(s): heartburn   Ivp Dye [Iodinated Contrast Media] Hives    OK with benadryl pre-med ('50mg'$  one hour before receiving iodinated contrast agent)   Losartan     Other reaction(s): dizzy   Phenergan [Promethazine] Other (See Comments)    Avoids due to reaction with current medications    Medications: I have reviewed the patient's current medications.  Results for orders placed or performed during the hospital encounter of 02/24/23 (from the past  48 hour(s))  Comprehensive metabolic panel     Status: Abnormal   Collection Time: 02/24/23 11:05 AM  Result Value Ref Range   Sodium 137 135 - 145 mmol/L   Potassium 3.9 3.5 - 5.1 mmol/L   Chloride 98 98 - 111 mmol/L   CO2 28 22 - 32 mmol/L   Glucose, Bld 111 (H) 70 - 99 mg/dL    Comment: Glucose reference range applies only to samples taken after fasting for at least 8 hours.   BUN 33 (H) 8 - 23 mg/dL   Creatinine, Ser 2.02 (H) 0.44 - 1.00 mg/dL   Calcium 10.1 8.9 - 10.3 mg/dL   Total Protein 6.5 6.5 - 8.1 g/dL   Albumin 3.5 3.5 - 5.0 g/dL   AST 30 15 - 41 U/L   ALT 15 0 - 44 U/L   Alkaline Phosphatase 75 38 - 126 U/L   Total  Bilirubin 0.4 0.3 - 1.2 mg/dL   GFR, Estimated 24 (L) >60 mL/min    Comment: (NOTE) Calculated using the CKD-EPI Creatinine Equation (2021)    Anion gap 11 5 - 15    Comment: Performed at [facility] [address] [place], [place] [address]  CBC     Status: Abnormal   Collection Time: 02/24/23 11:05 AM  Result Value Ref Range   WBC 7.6 4.0 - 10.5 K/uL   RBC 3.37 (L) 3.87 - 5.11 MIL/uL   Hemoglobin 11.2 (L) 12.0 - 15.0 g/dL   HCT 35.3 (L) 36.0 - 46.0 %   MCV 104.7 (H) 80.0 - 100.0 fL   MCH 33.2 26.0 - 34.0 pg   MCHC 31.7 30.0 - 36.0 g/dL   RDW 13.6 11.5 - 15.5 %   Platelets 202 150 - 400 K/uL   nRBC 0.0 0.0 - 0.2 %    Comment: Performed at [facility], [place] [address], [place], [place] [address]  Protime-INR     Status: Abnormal   Collection Time: 02/24/23 11:05 AM  Result Value Ref Range   Prothrombin Time 26.0 (H) 11.4 - 15.2 seconds   INR 2.4 (H) 0.8 - 1.2    Comment: (NOTE) INR goal varies based on device and disease states. Performed at [facility], [place] [address] [place], [place] [address]   Type and screen [place]     Status: None   Collection Time: 02/24/23 11:05 AM  Result Value Ref Range   ABO/RH(D) O POS    Antibody Screen NEG    Sample Expiration      02/27/2023,2359 Performed at  [facility], [place] [address] [place], [place] [address]   I-stat chem 8, ed     Status: Abnormal   Collection Time: 02/24/23 11:15 AM  Result Value Ref Range   Sodium 137 135 - 145 mmol/L   Potassium 4.0 3.5 - 5.1 mmol/L   Chloride 99 98 - 111 mmol/L   BUN 38 (H) 8 - 23 mg/dL   Creatinine, Ser 2.40 (H) 0.44 - 1.00 mg/dL   Glucose, Bld 105 (H) 70 - 99 mg/dL    Comment: Glucose reference range applies only to samples taken after fasting for at least 8 hours.   Calcium, Ion 1.26 1.15 - 1.40 mmol/L   TCO2 32 22 - 32 mmol/L   Hemoglobin 11.6 (L) 12.0 - 15.0 g/dL   HCT 34.0 (L) 36.0 - 46.0 %  Ethanol     Status: None   Collection Time: 02/24/23 11:21 AM  Result Value Ref Range   Alcohol, Ethyl (B) <10 <10 mg/dL    Comment: (NOTE) Lowest detectable limit for serum alcohol is 10 mg/dL.  For medical purposes only. Performed at [facility], [place] [address], [place], [place] [address]   Lactic acid, plasma     Status: None   Collection Time: 02/24/23 11:21 AM  Result Value Ref Range   Lactic Acid, Venous 1.2 0.5 - 1.9 mmol/L    Comment: Performed at [facility] [address] [place], [place] [address]    DG Pelvis Portable  Result Date: 02/24/2023 CLINICAL DATA:  Fall, pain post fall. EXAM: PORTABLE PELVIS 1-2 VIEWS COMPARISON:  None Available. FINDINGS: There is no evidence of pelvic fracture or diastasis. No pelvic bone lesions are seen. Multilevel degenerate disc disease of the lower lumbar spine. IMPRESSION: 1. No acute fracture or malalignment.  2. Multilevel degenerate disc disease of the lower lumbar spine. Electronically Signed   By: Keane Police D.O.   On: 02/24/2023 13:11   DG Clavicle Left  Result Date: 02/24/2023 CLINICAL DATA:  Left shoulder pain post fall EXAM: LEFT CLAVICLE - 2+ VIEWS COMPARISON:  None Available. FINDINGS: There is displaced fracture of the distal clavicle glenohumeral joint appear intact. Low lung volumes. Soft tissues are  unremarkable. IMPRESSION: Displaced fracture of the distal clavicle. Electronically Signed   By: Keane Police D.O.   On: 02/24/2023 12:55   DG Chest Port 1 View  Result Date: 02/24/2023 CLINICAL DATA:  Pain after trauma EXAM: PORTABLE CHEST 1 VIEW COMPARISON:  07/01/2022 x-ray FINDINGS: Sternal wires. Underinflation. Borderline cardiopericardial silhouette with calcified tortuous aorta. Calcifications along the mitral valve annulus. No consolidation, pneumothorax, effusion or edema. Focal deformity of the distal aspect of the left clavicle not clearly seen previously. Prominent calcifications of the tracheobronchial tree. IMPRESSION: Postop chest.  Underinflation.  Borderline size heart. Deformity of the distal aspect of the left clavicle not clearly seen on the prior x-ray. Dedicated x-rays of the clavicle or shoulder as clinically directed Electronically Signed   By: Jill Side M.D.   On: 02/24/2023 12:05   CT HEAD WO CONTRAST (5MM)  Result Date: 02/24/2023 CLINICAL DATA:  Trauma EXAM: CT HEAD WITHOUT CONTRAST TECHNIQUE: Contiguous axial images were obtained from the base of the skull through the vertex without intravenous contrast. RADIATION DOSE REDUCTION: This exam was performed according to the departmental dose-optimization program which includes automated exposure control, adjustment of the mA and/or kV according to patient size and/or use of iterative reconstruction technique. COMPARISON:  07/01/2022. FINDINGS: Brain: Petechial hemorrhage identified at the vertex on the right as well as in the right frontal lobe consistent with areas of contusion. No mass effect or shift. There is extra-axial fluid along the right frontotemporal region consistent with a tiny amount of subdural hematoma about 4 mm thick. There is right frontotemporal hyperdense subcutaneous collection consistent with cephalohematoma. No hydrocephalus. No shift. Vascular: No hyperdense vessel or unexpected calcification. Skull:  Normal. Negative for fracture or focal lesion. Sinuses/Orbits: No acute finding. Other: None. IMPRESSION: 1. Involutional changes and chronic periventricular white-matter small-vessel ischemia. 2. Very small right frontotemporal subdural hematoma. No associated mass effect or shift. 3. Petechial hemorrhages consistent with contusion noted right frontal lobe and vertex. Findings were discussed with and acknowledged by Dr. Sherry Ruffing. Electronically Signed   By: Sammie Bench M.D.   On: 02/24/2023 12:05   CT Cervical Spine Wo Contrast  Result Date: 02/24/2023 CLINICAL DATA:  Neck trauma EXAM: CT CERVICAL SPINE WITHOUT CONTRAST TECHNIQUE: Multidetector CT imaging of the cervical spine was performed without intravenous contrast. Multiplanar CT image reconstructions were also generated. RADIATION DOSE REDUCTION: This exam was performed according to the departmental dose-optimization program which includes automated exposure control, adjustment of the mA and/or kV according to patient size and/or use of iterative reconstruction technique. COMPARISON:  07/01/2022 FINDINGS: Alignment: Accentuated cervical lordosis Skull base and vertebrae: No acute fracture. No primary bone lesion or focal pathologic process. T2 and T3 chronic superior endplate fractures. Soft tissues and spinal canal: No prevertebral fluid or swelling. No visible canal hematoma. Disc levels: Degenerative facet spurring asymmetric to the left at C2-3 and C3-4. Upper chest: No acute finding IMPRESSION: No evidence of acute intracranial or cervical spine injury. Electronically Signed   By: Jorje Guild M.D.   On: 02/24/2023 11:55    Review of Systems  HENT:  Negative for ear discharge, ear pain, hearing loss and tinnitus.   Eyes:  Negative for photophobia and pain.  Respiratory:  Negative for cough and shortness of breath.   Cardiovascular:  Negative for chest pain.  Gastrointestinal:  Negative for abdominal pain, nausea and vomiting.   Genitourinary:  Negative for dysuria, flank pain, frequency and urgency.  Musculoskeletal:  Positive for arthralgias (Left shoulder). Negative for back pain, myalgias and neck pain.  Neurological:  Positive for headaches. Negative for dizziness.  Hematological:  Does not bruise/bleed easily.  Psychiatric/Behavioral:  The patient is not nervous/anxious.    Blood pressure 121/72, pulse 80, temperature 98.2 F (36.8 C), temperature source Oral, resp. rate 16, height 5' (1.524 m), weight 54 kg, SpO2 92 %. Physical Exam Constitutional:      General: She is not in acute distress.    Appearance: She is well-developed. She is not diaphoretic.  HENT:     Head: Normocephalic and atraumatic.  Eyes:     General: No scleral icterus.       Right eye: No discharge.        Left eye: No discharge.     Conjunctiva/sclera: Conjunctivae normal.  Cardiovascular:     Rate and Rhythm: Normal rate and regular rhythm.  Pulmonary:     Effort: Pulmonary effort is normal. No respiratory distress.  Musculoskeletal:     Cervical back: Normal range of motion.     Comments: Left shoulder, elbow, wrist, digits- no skin wounds, mod TTP distal clavicle, no tenting, no instability, no blocks to motion  Sens  Ax/R/M/U intact  Mot   Ax/ R/ PIN/ M/ AIN/ U intact  Rad 2+  Skin:    General: Skin is warm and dry.  Neurological:     Mental Status: She is alert.  Psychiatric:        Mood and Affect: Mood normal.        Behavior: Behavior normal.    Assessment/Plan: Left clavicle fx -- Plan non-operative management with sling and NWB. F/u with Dr. Marcelino Scot in 2 weeks.    Lisette Abu, PA-C Orthopedic Surgery [phone] 02/24/2023, 1:19 PM

## 2023-02-24 NOTE — Consult Note (Signed)
Called by EDP Dr. Gustavus Messing for assist with laceration. Small forehead lac with active bleeding. I removed the suture and placed a running 4-0 prolene. There is a large hematoma but good hemostasis now.  Georganna Skeans, MD, MPH, FACS Please use [email or web address] to contact on call provider

## 2023-02-24 NOTE — H&P (Signed)
[name] 1941-10-09  [id number].    Requesting MD: Dr. Harrell Gave Tegeler  Chief Complaint/Reason for Consult: Fall on thinners   HPI: [name] is a 82 y.o. female with hx of Progressive supranuclear palsy, BPV, HTN, HLD, CKD2, PAF and mechanical AVR on Coumadin (INR 2.4) who presented to the ED after a fall. Patient was attempting to get out of her car when she fell. At baseline she has significant fall hx and cannot ambulate without assistive devices/husband's support. Found to have a small laceration to the R forehead with an arterial bleed that was closed with figure of 8 stitch.  A large pon developed from this, but it did stop bleeding. Complains of L chest wall pain and L shoulder pain. Airway intact. HDS in trauma bay. CXR and pelvic xrays done. CTH w/ R SDH and contusion of the R frontal lobe + vertex contusions. CT C-Spine negative, but with pain in her midline with ROM. L clavicle fx on films. CT CAP pending.   ROS: ROS As above, see hpi  Family History  Problem Relation Age of Onset   Heart disease Mother    Heart disease Father 83       CABG   Other Brother        AVR   Healthy Child     Past Medical History:  Diagnosis Date   BPV (benign positional vertigo)    Chronic neck pain    C2-3 arthropathy   CKD (chronic kidney disease), stage II    Diverticulosis of colon    GERD (gastroesophageal reflux disease)    Hiatal hernia    History of breast cancer per pt no recurrence   dx 2004-- Left breast DCIS (ER/PR+, HER2 negative) s/p  partial masectomy w/ sln bx and  chemoradiotion (completed 2004)   History of herpes zoster    History of kidney stones    HTN (hypertension)    Hyperlipidemia    Irritable bowel syndrome    LBBB (left bundle branch block)    PAF (paroxysmal atrial fibrillation) (HCC)    a. CHA2DS2VASc = 4-->coumadin.   Parkinsonism    neurologist-  dr tat   PONV (postoperative nausea and vomiting)    S/P aortic valve  replacement with prosthetic valve    a. 04/13/2006 s/p St Jude mechnical AVR for severe AS;  b. 09/2014 Echo: EF 40-45%, gr1 DD, mild AI/MR, triv TR/PR.   Squamous cell carcinoma of skin 04/06/2022   right nasal sidewall   SUI (stress urinary incontinence, female)    Symptomatic anemia    Venous varices      Past Surgical History:  Procedure Laterality Date   ANTERIOR AND POSTERIOR REPAIR  1990   cystocele/ rectocele   AORTIC VALVE REPLACEMENT  04/13/06   dr gerhardt   and Replacement Ascending Aorta (St. Jude mechanical prosthesis)   APPENDECTOMY  child 1950   BREAST EXCISIONAL BIOPSY Left    BREAST LUMPECTOMY Left 01/11/2003   malignant   BREAST SURGERY Left 2004   CARDIAC CATHETERIZATION  02-22-2001   dr Martinique   minimal non-obstructive cad/  mild aortic stenosis and aortic root enlargement/  normal LVF, ef 65%   CARDIAC CATHETERIZATION  03-22-2006    dr Martinique   no significant obstructive cad/  severe aortic stenosis and mild to moderate aortic root enlargement/  upper normal right heart pressures   CARDIOVASCULAR STRESS TEST  09-30-2011   dr Martinique   lexiscan nuclear study w/  apical thinning  vs  small prior apical infarct w/ no significant ischemia/  hypokinesis of the distal septum and apex, ef 50%   CARPAL TUNNEL RELEASE Bilateral left 09-09-2004/  right 2007   CATARACT EXTRACTION W/ INTRAOCULAR LENS  IMPLANT, BILATERAL  2015   COLONOSCOPY N/A 10/03/2014   Procedure: COLONOSCOPY;  Surgeon: [name], MD;  Location: [facility];  Service: Endoscopy;  Laterality: N/A;   CYSTOSCOPY WITH RETROGRADE PYELOGRAM, URETEROSCOPY AND STENT PLACEMENT Right 09/17/2016   Procedure: CYSTOSCOPY WITH RIGHT RETROGRADE PYELOGRAM, RIGHT URETEROSCOPY AND STENT PLACEMENT LASER LITHOTRIPSY, RIGHT STENT PLACEMENT;  Surgeon: [name], MD;  Location: [facility];  Service: Urology;  Laterality: Right;   ESOPHAGEAL MANOMETRY N/A 08/12/2014   Procedure: ESOPHAGEAL MANOMETRY  (EM);  Surgeon: [name], MD;  Location: [facility];  Service: Endoscopy;  Laterality: N/A;   ESOPHAGOGASTRODUODENOSCOPY N/A 10/03/2014   Procedure: ESOPHAGOGASTRODUODENOSCOPY (EGD);  Surgeon: [name], MD;  Location: [facility];  Service: Endoscopy;  Laterality: N/A;   ESOPHAGOGASTRODUODENOSCOPY N/A 12/08/2018   Procedure: ESOPHAGOGASTRODUODENOSCOPY (EGD);  Surgeon: Doran Stabler, MD;  Location: [facility];  Service: Gastroenterology;  Laterality: N/A;   EXTRACORPOREAL SHOCK WAVE LITHOTRIPSY  1997   FOOT SURGERY Right 2013   hammertoe x2   HOLMIUM LASER APPLICATION Right [id number]   Procedure: HOLMIUM LASER APPLICATION;  Surgeon: [name], MD;  Location: [facility];  Service: Urology;  Laterality: Right;   PERCUTANEOUS NEPHROSTOLITHOTOMY  1993   STONE EXTRACTION WITH BASKET Right 09/17/2016   Procedure: STONE EXTRACTION WITH BASKET;  Surgeon: [name], MD;  Location: [facility];  Service: Urology;  Laterality: Right;   TOTAL ABDOMINAL HYSTERECTOMY  1971   w/ Bilateral Salpingoophorectomy   TRANSTHORACIC ECHOCARDIOGRAM  10/24/2014   dr Martinique   hypokinesis of the basal and mid inferoseptal and inferior walls,  ef 40-45%,  grade 1 diastolic dysfunction/  mechanical bileaflet aortic valve noted w/ mild regur., peak grandient 74mHg, valve area 1.35cm^2/  severe MV calcification without stenosis w/ mild regurg., peak gradient 335mg/  mild LAE/  poss. atrial mass (MRI showed benign lipomatous hypertrophy of atrial septum) /tFrazier ButtR/mild TR    Social History:  reports that she has never smoked. She has never used smokeless tobacco. She reports that she does not drink alcohol and does not use drugs.  Allergies:  Allergies  Allergen Reactions   Demerol [Meperidine] Shortness Of Breath and Swelling   Oxycodone Other (See Comments)    Hallucinations    Adhesive [Tape] Other (See Comments)    Redness and skin tears    Alendronate     Other reaction(s): heartburn   Ivp Dye [Iodinated Contrast Media] Hives    OK with benadryl pre-med ('50mg'$  one hour before receiving iodinated contrast agent)   Losartan     Other reaction(s): dizzy   Phenergan [Promethazine] Other (See Comments)    Avoids due to reaction with current medications    (Not in a hospital admission)    Physical Exam: Blood pressure 121/72, pulse 80, temperature 98.2 F (36.8 C), temperature source Oral, resp. rate 16, height 5' (1.524 m), weight 54 kg, SpO2 92 %. General: pleasant, elderly female who is laying in bed in NAD HEENT: R forehead laceration s/p repair, cdi.  Sclera are noninjected.  PERRL.  Ears and nose without any masses or lesions.  Mouth is pink and moist. Dentition fair Neck: CT c-spine reviewed and negative for fracture. Clinical exam performed  to evaluate for ligamentous injury. C-spine evaluation performed. Patient does have midline c-spine ttp. Will replace C-Collar and get Flex/Ex films  Heart: regular rhythm, but bradycardia.  Palpable radial and pedal pulses bilaterally  Lungs: CTAB, no wheezes, rhonchi, or rales noted.  Respiratory effort nonlabored.  Left mid chest wall tenderness.  No bruising noted.  Right mastectomy Abd:  Soft, NT/ND, +BS. No masses, hernias, or organomegaly MS: L clavicle ttp only mildly. No ttp of remainder of LUE or of RUE, RLE or LLE. Back is normal with no stepoffs or midline tenderness. Skin: warm and dry  Psych: A&Ox4 with an appropriate affect Neuro: cranial nerves grossly intact, normal speech, thought process intact, moves all extremities, gait not assessed.  Mild RUE resting tremor  Results for orders placed or performed during the hospital encounter of 02/24/23 (from the past 48 hour(s))  Comprehensive metabolic panel     Status: Abnormal   Collection Time: 02/24/23 11:05 AM  Result Value Ref Range   Sodium 137 135 - 145 mmol/L   Potassium 3.9 3.5 - 5.1 mmol/L   Chloride 98 98 -  111 mmol/L   CO2 28 22 - 32 mmol/L   Glucose, Bld 111 (H) 70 - 99 mg/dL    Comment: Glucose reference range applies only to samples taken after fasting for at least 8 hours.   BUN 33 (H) 8 - 23 mg/dL   Creatinine, Ser 2.02 (H) 0.44 - 1.00 mg/dL   Calcium 10.1 8.9 - 10.3 mg/dL   Total Protein 6.5 6.5 - 8.1 g/dL   Albumin 3.5 3.5 - 5.0 g/dL   AST 30 15 - 41 U/L   ALT 15 0 - 44 U/L   Alkaline Phosphatase 75 38 - 126 U/L   Total Bilirubin 0.4 0.3 - 1.2 mg/dL   GFR, Estimated 24 (L) >60 mL/min    Comment: (NOTE) Calculated using the CKD-EPI Creatinine Equation (2021)    Anion gap 11 5 - 15    Comment: Performed at [facility] [address], [place], [place] [address]  CBC     Status: Abnormal   Collection Time: 02/24/23 11:05 AM  Result Value Ref Range   WBC 7.6 4.0 - 10.5 K/uL   RBC 3.37 (L) 3.87 - 5.11 MIL/uL   Hemoglobin 11.2 (L) 12.0 - 15.0 g/dL   HCT 35.3 (L) 36.0 - 46.0 %   MCV 104.7 (H) 80.0 - 100.0 fL   MCH 33.2 26.0 - 34.0 pg   MCHC 31.7 30.0 - 36.0 g/dL   RDW 13.6 11.5 - 15.5 %   Platelets 202 150 - 400 K/uL   nRBC 0.0 0.0 - 0.2 %    Comment: Performed at [facility], [place] [address], [place], [place] [address]  Protime-INR     Status: Abnormal   Collection Time: 02/24/23 11:05 AM  Result Value Ref Range   Prothrombin Time 26.0 (H) 11.4 - 15.2 seconds   INR 2.4 (H) 0.8 - 1.2    Comment: (NOTE) INR goal varies based on device and disease states. Performed at [facility], [place] [address] [place], [place] [address]   Type and screen [place]     Status: None   Collection Time: 02/24/23 11:05 AM  Result Value Ref Range   ABO/RH(D) O POS    Antibody Screen NEG    Sample Expiration      02/27/2023,2359 Performed at [facility], [place] [address],  [place], [place] [address]   I-stat chem 8, ed     Status: Abnormal   Collection Time: 02/24/23 11:15 AM  Result Value Ref Range   Sodium 137 135 - 145 mmol/L   Potassium  4.0 3.5 - 5.1 mmol/L   Chloride 99 98 - 111 mmol/L   BUN 38 (H) 8 - 23 mg/dL   Creatinine, Ser 2.40 (H) 0.44 - 1.00 mg/dL   Glucose, Bld 105 (H) 70 - 99 mg/dL    Comment: Glucose reference range applies only to samples taken after fasting for at least 8 hours.   Calcium, Ion 1.26 1.15 - 1.40 mmol/L   TCO2 32 22 - 32 mmol/L   Hemoglobin 11.6 (L) 12.0 - 15.0 g/dL   HCT 34.0 (L) 36.0 - 46.0 %  Ethanol     Status: None   Collection Time: 02/24/23 11:21 AM  Result Value Ref Range   Alcohol, Ethyl (B) <10 <10 mg/dL    Comment: (NOTE) Lowest detectable limit for serum alcohol is 10 mg/dL.  For medical purposes only. Performed at [facility], [place] [address], [place], [place] [address]   Lactic acid, plasma     Status: None   Collection Time: 02/24/23 11:21 AM  Result Value Ref Range   Lactic Acid, Venous 1.2 0.5 - 1.9 mmol/L    Comment: Performed at [facility] [address] [place], [place] [address]   DG Pelvis Portable  Result Date: 02/24/2023 CLINICAL DATA:  Fall, pain post fall. EXAM: PORTABLE PELVIS 1-2 VIEWS COMPARISON:  None Available. FINDINGS: There is no evidence of pelvic fracture or diastasis. No pelvic bone lesions are seen. Multilevel degenerate disc disease of the lower lumbar spine. IMPRESSION: 1. No acute fracture or malalignment. 2. Multilevel degenerate disc disease of the lower lumbar spine. Electronically Signed   By: Keane Police D.O.   On: 02/24/2023 13:11   DG Clavicle Left  Result Date: 02/24/2023 CLINICAL DATA:  Left shoulder pain post fall EXAM: LEFT CLAVICLE - 2+ VIEWS COMPARISON:  None Available. FINDINGS: There is displaced fracture of the distal clavicle glenohumeral joint appear intact. Low lung volumes. Soft tissues are unremarkable. IMPRESSION: Displaced fracture of the distal clavicle. Electronically Signed   By: Keane Police D.O.   On: 02/24/2023 12:55   DG Chest Port 1 View  Result Date: 02/24/2023 CLINICAL DATA:  Pain after trauma  EXAM: PORTABLE CHEST 1 VIEW COMPARISON:  07/01/2022 x-ray FINDINGS: Sternal wires. Underinflation. Borderline cardiopericardial silhouette with calcified tortuous aorta. Calcifications along the mitral valve annulus. No consolidation, pneumothorax, effusion or edema. Focal deformity of the distal aspect of the left clavicle not clearly seen previously. Prominent calcifications of the tracheobronchial tree. IMPRESSION: Postop chest.  Underinflation.  Borderline size heart. Deformity of the distal aspect of the left clavicle not clearly seen on the prior x-ray. Dedicated x-rays of the clavicle or shoulder as clinically directed Electronically Signed   By: Jill Side M.D.   On: 02/24/2023 12:05   CT HEAD WO CONTRAST (5MM)  Result Date: 02/24/2023 CLINICAL DATA:  Trauma EXAM: CT HEAD WITHOUT CONTRAST TECHNIQUE: Contiguous axial images were obtained from the base of the skull through the vertex without intravenous contrast. RADIATION DOSE REDUCTION: This exam was performed according to the departmental dose-optimization program which includes automated exposure control, adjustment of the mA and/or kV according to patient size and/or use of iterative reconstruction technique. COMPARISON:  07/01/2022. FINDINGS: Brain: Petechial hemorrhage identified at the vertex on the right as  well as in the right frontal lobe consistent with areas of contusion. No mass effect or shift. There is extra-axial fluid along the right frontotemporal region consistent with a tiny amount of subdural hematoma about 4 mm thick. There is right frontotemporal hyperdense subcutaneous collection consistent with cephalohematoma. No hydrocephalus. No shift. Vascular: No hyperdense vessel or unexpected calcification. Skull: Normal. Negative for fracture or focal lesion. Sinuses/Orbits: No acute finding. Other: None. IMPRESSION: 1. Involutional changes and chronic periventricular white-matter small-vessel ischemia. 2. Very small right frontotemporal  subdural hematoma. No associated mass effect or shift. 3. Petechial hemorrhages consistent with contusion noted right frontal lobe and vertex. Findings were discussed with and acknowledged by Dr. Sherry Ruffing. Electronically Signed   By: Sammie Bench M.D.   On: 02/24/2023 12:05   CT Cervical Spine Wo Contrast  Result Date: 02/24/2023 CLINICAL DATA:  Neck trauma EXAM: CT CERVICAL SPINE WITHOUT CONTRAST TECHNIQUE: Multidetector CT imaging of the cervical spine was performed without intravenous contrast. Multiplanar CT image reconstructions were also generated. RADIATION DOSE REDUCTION: This exam was performed according to the departmental dose-optimization program which includes automated exposure control, adjustment of the mA and/or kV according to patient size and/or use of iterative reconstruction technique. COMPARISON:  07/01/2022 FINDINGS: Alignment: Accentuated cervical lordosis Skull base and vertebrae: No acute fracture. No primary bone lesion or focal pathologic process. T2 and T3 chronic superior endplate fractures. Soft tissues and spinal canal: No prevertebral fluid or swelling. No visible canal hematoma. Disc levels: Degenerative facet spurring asymmetric to the left at C2-3 and C3-4. Upper chest: No acute finding IMPRESSION: No evidence of acute intracranial or cervical spine injury. Electronically Signed   By: Jorje Guild M.D.   On: 02/24/2023 11:55    Anti-infectives (From admission, onward)    None       Assessment/Plan Fall on Thinners TBI/R SDH/Contusion of the R frontal lobe + vertex - NSGY consult. Dr. Ellene Route. No acute intervention. No need to reverse anticoagulation.  He is ok with what cardiology needs to do in regards to her anticoagulation.  Usually once INR under 2 will start lovenox/heparin.  Will await cards eval and thoughts. Repeat CTH in 12 hours. Keppra for seizure ppx. ICU for frequent neuro checks. TBI team therapies.  L clavicle fx - Ortho consulted C-Spine - CT  negative but midline ttp. Keep C-Collar and obatin Flex/Ex films.  Scalp laceration - s/p repair 2/29 Hx PAF Hx AVR on Coumadin - INR 2.4. Anticoagulation reversal not needed per NSGY. Cards to see for additional input.  Hx BPV  Hx PSP - followed by Dr. Carles Collet with neurology.  Patient has significant fall history and is unable to mobilize on her own.  She requires assistive device and support of husband at ALL times. HX CKD2 Hx HTN Hx HLD FEN - NPO till final scans and Ortho eval.  VTE - SCDs, hold Coumadin (INR 2.4) ID - None Foley - None, bladder scan prn Dispo - Admit to trauma ICU, inpatient .   Plan of care discussed with neurosurgery on the phone and at bedside, ortho at the bedside as well.  EDP has contacted cardiology to evaluate the patient.  I reviewed nursing notes, ED provider notes, last 24 h vitals and pain scores, last 48 h intake and output, last 24 h labs and trends, and last 24 h imaging results  Henreitta Cea, [facility] Surgery 02/24/2023, 1:15 PM Please see Amion for pager number during day hours 7:00am-4:30pm

## 2023-02-24 NOTE — Consult Note (Addendum)
Cardiology Consultation   Patient ID: [name] MRN: [id number]; DOB: 01-Aug-1941  Admit date: 02/24/2023 Date of Consult: 02/24/2023  PCP:  Donnajean Lopes, MD   [facility] Providers Cardiologist:  Peter Martinique, MD  Electrophysiologist:  Cristopher Peru, MD    Patient Profile:   [name] is a 82 y.o. female with a hx of hypertension, accessible atrial fibrillation, PVCs, sinus bradycardia, chronic combined systolic and diastolic CHF, CKD stage III, status post mechanical Saint Jude aortic valve who is being seen 02/24/2023 for the evaluation of anticoagulation at the request of trauma MD.  History of Present Illness:   [name] is an 82 year old female with past medical history noted above.  She has been followed by Dr. Martinique as an outpatient, as well as Dr. Lovena Le.  She has a history of paroxysmal atrial fibrillation and is anticoagulated with Coumadin.  In September 2017 following a urologic procedure she developed abdominal pain and nausea with chest pain and palpitations.  She was found to be in atrial fibrillation with RVR and later converted to sinus rhythm.  She was found to have elevated troponin and underwent stress testing which was low risk.  Admitted 2019 for recurrent atrial fibrillation with RVR with chest pain in the setting of anemia with a hemoglobin of 5.6 following a traumatic fall with extensive hematoma of the left chest.  She was started on low-dose amiodarone.  Seen by Dr. Lovena Le 02/2019 and was maintaining sinus rhythm on amiodarone.  She continued to have frequent falls at that time.  Last seen by Dr. Martinique 12/2021 with a recent bout of bronchitis which was treated with antibiotics.  Her blood pressure has been well-controlled.  Did continue to have falls at home.  She was seen 2/23 in the ED with complaints of chest pain abdominal pain.  She was found to have a spontaneous rectus sheath hematoma.  Surgery was consulted with no  intervention necessary.  She received PRBCs.  Her INR was noted to be therapeutic.  Coumadin was resumed during that admission.  She was last seen in the office on 03/2022 with [name] and reported doing well.  She was continued on home medications including amiodarone, metoprolol, statin and Coumadin.  Presented to the ED on 2/29 after a fall.  Reports she was attempting to get out of her car when she fell.  She was found to have a small laceration to the right forehead with an arterial bleed which was closed with sutures.  CT head was consistent with right sided subdural hematoma and contusion of the right frontal lobe.  CT spine negative.  She was admitted to trauma services.  She was evaluated by neurosurgery, Dr. Ellene Route was recommendations to cautiously continue her current anticoagulation without reversal given her need for anticoagulation with mechanical valve.  Plans for repeat CT scan to be performed in 12 hours.   Admission labs showed sodium 137, potassium 3.9, creatinine 2.02, lactic acid 1.2, WBC 7.9, hemoglobin 11.2, INR 2.4  Cardiology has been asked to evaluate given her history of mechanical aortic valve and anticoagulation.  The pt denies CP   Breathing is OK   Is very unsteady on feet   Fell when husband not looking   Past Medical History:  Diagnosis Date   BPV (benign positional vertigo)    Chronic neck pain    C2-3 arthropathy   CKD (chronic kidney disease), stage II    Diverticulosis of colon    GERD (  gastroesophageal reflux disease)    Hiatal hernia    History of breast cancer per pt no recurrence   dx 2004-- Left breast DCIS (ER/PR+, HER2 negative) s/p  partial masectomy w/ sln bx and  chemoradiotion (completed 2004)   History of herpes zoster    History of kidney stones    HTN (hypertension)    Hyperlipidemia    Irritable bowel syndrome    LBBB (left bundle branch block)    PAF (paroxysmal atrial fibrillation) (HCC)    a. CHA2DS2VASc = 4-->coumadin.    Parkinsonism    neurologist-  dr tat   PONV (postoperative nausea and vomiting)    S/P aortic valve replacement with prosthetic valve    a. 04/13/2006 s/p St Jude mechnical AVR for severe AS;  b. 09/2014 Echo: EF 40-45%, gr1 DD, mild AI/MR, triv TR/PR.   Squamous cell carcinoma of skin 04/06/2022   right nasal sidewall   SUI (stress urinary incontinence, female)    Symptomatic anemia    Venous varices      Past Surgical History:  Procedure Laterality Date   ANTERIOR AND POSTERIOR REPAIR  1990   cystocele/ rectocele   AORTIC VALVE REPLACEMENT  04/13/06   dr gerhardt   and Replacement Ascending Aorta (St. Jude mechanical prosthesis)   APPENDECTOMY  child 1950   BREAST EXCISIONAL BIOPSY Left    BREAST LUMPECTOMY Left 01/11/2003   malignant   BREAST SURGERY Left 2004   CARDIAC CATHETERIZATION  02-22-2001   dr Martinique   minimal non-obstructive cad/  mild aortic stenosis and aortic root enlargement/  normal LVF, ef 65%   CARDIAC CATHETERIZATION  03-22-2006    dr Martinique   no significant obstructive cad/  severe aortic stenosis and mild to moderate aortic root enlargement/  upper normal right heart pressures   CARDIOVASCULAR STRESS TEST  09-30-2011   dr Martinique   lexiscan nuclear study w/ apical thinning  vs  small prior apical infarct w/ no significant ischemia/  hypokinesis of the distal septum and apex, ef 50%   CARPAL TUNNEL RELEASE Bilateral left 09-09-2004/  right 2007   CATARACT EXTRACTION W/ INTRAOCULAR LENS  IMPLANT, BILATERAL  2015   COLONOSCOPY N/A 10/03/2014   Procedure: COLONOSCOPY;  Surgeon: [name], MD;  Location: [facility];  Service: Endoscopy;  Laterality: N/A;   CYSTOSCOPY WITH RETROGRADE PYELOGRAM, URETEROSCOPY AND STENT PLACEMENT Right 09/17/2016   Procedure: CYSTOSCOPY WITH RIGHT RETROGRADE PYELOGRAM, RIGHT URETEROSCOPY AND STENT PLACEMENT LASER LITHOTRIPSY, RIGHT STENT PLACEMENT;  Surgeon: [name], MD;  Location: [facility];  Service:  Urology;  Laterality: Right;   ESOPHAGEAL MANOMETRY N/A 08/12/2014   Procedure: ESOPHAGEAL MANOMETRY (EM);  Surgeon: [name], MD;  Location: [facility];  Service: Endoscopy;  Laterality: N/A;   ESOPHAGOGASTRODUODENOSCOPY N/A 10/03/2014   Procedure: ESOPHAGOGASTRODUODENOSCOPY (EGD);  Surgeon: [name], MD;  Location: [facility];  Service: Endoscopy;  Laterality: N/A;   ESOPHAGOGASTRODUODENOSCOPY N/A 12/08/2018   Procedure: ESOPHAGOGASTRODUODENOSCOPY (EGD);  Surgeon: Doran Stabler, MD;  Location: [facility];  Service: Gastroenterology;  Laterality: N/A;   EXTRACORPOREAL SHOCK WAVE LITHOTRIPSY  1997   FOOT SURGERY Right 2013   hammertoe x2   HOLMIUM LASER APPLICATION Right [id number]   Procedure: HOLMIUM LASER APPLICATION;  Surgeon: [name], MD;  Location: [facility];  Service: Urology;  Laterality: Right;   PERCUTANEOUS NEPHROSTOLITHOTOMY  1993   STONE EXTRACTION WITH BASKET Right 09/17/2016   Procedure: STONE EXTRACTION WITH BASKET;  Surgeon:  [name], MD;  Location: [facility];  Service: Urology;  Laterality: Right;   TOTAL ABDOMINAL HYSTERECTOMY  1971   w/ Bilateral Salpingoophorectomy   TRANSTHORACIC ECHOCARDIOGRAM  10/24/2014   dr Martinique   hypokinesis of the basal and mid inferoseptal and inferior walls,  ef 40-45%,  grade 1 diastolic dysfunction/  mechanical bileaflet aortic valve noted w/ mild regur., peak grandient 72mHg, valve area 1.35cm^2/  severe MV calcification without stenosis w/ mild regurg., peak gradient 349mg/  mild LAE/  poss. atrial mass (MRI showed benign lipomatous hypertrophy of atrial septum) /tFrazier ButtR/mild TR     Home Medications:  Prior to Admission medications   Medication Sig Start Date End Date Taking? Authorizing Provider  amiodarone (PACERONE) 200 MG tablet Take 1 tablet by mouth once daily Patient taking differently: Take 200 mg by mouth daily. DOES NOT TAKE ON SUNDAYS 04/19/22    ClDeberah PeltonNP  calcium carbonate (TUMS - DOSED IN MG ELEMENTAL CALCIUM) 500 MG chewable tablet Chew 1 tablet by mouth daily as needed for indigestion or heartburn.     [provider]  calcium citrate-vitamin D (CITRACAL+D) 315-200 MG-UNIT per tablet Take 1 tablet by mouth 3 (three) times daily.    [provider]  carbidopa-levodopa (SINEMET IR) 25-100 MG tablet TAKE 2 TABLETS BY MOUTH THREE TIMES DAILY 01/03/23   Tat, ReEustace QuailDO  Cholecalciferol (VITAMIN D3) 2000 units TABS Take 2,000 Units by mouth daily.     [provider]  famotidine-calcium carbonate-magnesium hydroxide (PEPCID COMPLETE) 10-800-165 MG CHEW chewable tablet Chew 1 tablet by mouth daily as needed (heartburn).    [provider]  fexofenadine (ALLEGRA) 60 MG tablet Take 60 mg by mouth at bedtime.    [provider]  furosemide (LASIX) 20 MG tablet Take 1 tablet by mouth once daily 04/21/22   JoMartiniquePeter M, MD  melatonin 5 MG TABS Take 5 mg by mouth at bedtime.    [provider]  metoprolol tartrate (LOPRESSOR) 25 MG tablet Take 1 tablet (25 mg total) by mouth daily as needed (if heart rate is above 110). 11/13/20 09/21/22  JoMartiniquePeter M, MD  Multiple Vitamin (MULTIVITAMIN) tablet Take 1 tablet by mouth daily at 12 noon.    [provider]  pantoprazole (PROTONIX) 40 MG tablet Take 40 mg by mouth daily. 06/07/17   [provider]  potassium chloride SA (KLOR-CON M) 20 MEQ tablet Take 20 mEq by mouth daily.    [provider]  Probiotic Product (PROBIOTIC-10 PO) Take 1 capsule by mouth daily.     [provider]  simvastatin (ZOCOR) 20 MG tablet Take 20 mg by mouth every evening.    [provider]  vitamin B-12 (CYANOCOBALAMIN) 1000 MCG tablet Take 1,000 mcg by mouth every morning.    [provider]  warfarin (COUMADIN) 5 MG tablet Take 2.5-5 mg by mouth every evening. Patient is taking 2.'5mg'$  on Sunday, Tues, [place] Sat. Patient is taking 5 mg on Monday, Wed, Friday.    [provider]    Inpatient Medications: Scheduled Meds:  acetaminophen  650 mg Oral Q6H   docusate sodium  100 mg Oral BID   levETIRAcetam  500 mg Oral BID   Continuous Infusions:  lactated ringers     methocarbamol (ROBAXIN) IV     PRN Meds: melatonin, methocarbamol **OR** methocarbamol (ROBAXIN) IV, morphine injection, morphine injection, ondansetron **OR** ondansetron (ZOFRAN) IV, traMADol  Allergies:    Allergies  Allergen Reactions   Demerol [Meperidine] Shortness Of Breath and Swelling   Oxycodone Other (See Comments)    Hallucinations    Adhesive [Tape] Other (See Comments)    Redness and skin tears   Alendronate     Other reaction(s): heartburn   Ivp Dye [Iodinated Contrast Media] Hives    OK with benadryl pre-med ('50mg'$  one hour before receiving iodinated contrast agent)   Losartan     Other reaction(s): dizzy   Phenergan [Promethazine] Other (See Comments)    Avoids due to reaction with current medications    Social History:   Social History   Socioeconomic History   Marital status: Married    Spouse name: Not on file   Number of children: 3   Years of education: 11   Highest education level: 11th grade  Occupational History   Occupation: Press photographer: OTHER    Comment: retired  Tobacco Use   Smoking status: Never   Smokeless tobacco: Never  Scientific laboratory technician Use: Never used  Substance and Sexual Activity   Alcohol use: No    Alcohol/week: 0.0 standard drinks of alcohol   Drug use: No   Sexual activity: Not on file  Other Topics Concern   Not on file  Social History Narrative      1/2 -1 soda a day     Right handed   Two story home   lives with spouse   Social Determinants of Health   Financial Resource Strain: Not on file  Food Insecurity: Not on file  Transportation Needs: Not on file  Physical Activity: Not on file  Stress: Not on file  Social  Connections: Not on file  Intimate Partner Violence: Not on file    Family History:    Family History  Problem Relation Age of Onset   Heart disease Mother    Heart disease Father 71       CABG   Other Brother        AVR   Healthy Child      ROS:  Please see the history of present illness.   All other ROS reviewed and negative.     Physical Exam/Data:   Vitals:   02/24/23 1220 02/24/23 1332 02/24/23 1415 02/24/23 1559  BP:  (!) 124/52 139/68   Pulse:  (!) 50 (!) 47   Resp:  (!) 21 19   Temp:    97.9 F (36.6 C)  TempSrc:    Oral  SpO2:  97% 97%   Weight: 54 kg     Height: 5' (1.524 m)      No intake or output data in the 24 hours ending 02/24/23 1709    02/24/2023   12:20 PM 01/26/2023    8:07 AM 09/21/2022    8:23 AM  Last 3 Weights  Weight (lbs) 119 lb 0.8 oz 117 lb 9.6 oz 113 lb  Weight (kg) 54 kg 53.343 kg 51.256 kg     Body mass index is 23.25 kg/m.  General:  Thin 82 yo in NAD HEENT:   Hematoma R scalp  Neck: no JVD Vascular: No carotid bruits; Distal pulses 2+ bilaterally Cardiac:  RRR  Crisp valve sound  Gr II/VI systolic murmur base Lungs:  clear to auscultation bilaterally, anteriorly  Abd: soft, nontender, no hepatomegaly  Ext: no LE edema    EKG:  The EKG was personally reviewed and demonstrates:  Pending  Telemetry:  Telemetry was personally reviewed and  demonstrates:  SR and atrial tachycardia   Relevant CV Studies:  Echo: 05/2020  IMPRESSIONS     1. Moderate global reduction in LV systolic function; s/p AVR with mild  AI and mean gradient 12 mmHg; biatrial enlargement; mild TR.   2. Left ventricular ejection fraction, by estimation, is 30 to 35%. The  left ventricle has moderately decreased function. The left ventricle  demonstrates global hypokinesis. Left ventricular diastolic parameters are  consistent with Grade II diastolic  dysfunction (pseudonormalization).   3. Right ventricular systolic function is normal. The right  ventricular  size is normal. Tricuspid regurgitation signal is inadequate for assessing  PA pressure.   4. Left atrial size was severely dilated.   5. Right atrial size was mildly dilated.   6. The mitral valve is normal in structure. Trivial mitral valve  regurgitation. No evidence of mitral stenosis.   7. The aortic valve has been repaired/replaced. Aortic valve  regurgitation is mild. No aortic stenosis is present. There is a unknown  St. Jude mechanical valve present in the aortic position. Procedure Date:  04/13/2006.   FINDINGS   Left Ventricle: Left ventricular ejection fraction, by estimation, is 30  to 35%. The left ventricle has moderately decreased function. The left  ventricle demonstrates global hypokinesis. The left ventricular internal  cavity size was normal in size.  There is no left ventricular hypertrophy. Left ventricular diastolic  parameters are consistent with Grade II diastolic dysfunction  (pseudonormalization).   Right Ventricle: The right ventricular size is normal.Right ventricular  systolic function is normal. Tricuspid regurgitation signal is inadequate  for assessing PA pressure. The tricuspid regurgitant velocity is 2.66 m/s,  and with an assumed right atrial  pressure of 3 mmHg, the estimated right ventricular systolic pressure is  123456 mmHg.   Left Atrium: Left atrial size was severely dilated.   Right Atrium: Right atrial size was mildly dilated.   Pericardium: There is no evidence of pericardial effusion.   Mitral Valve: The mitral valve is normal in structure. Normal mobility of  the mitral valve leaflets. Moderate mitral annular calcification. Trivial  mitral valve regurgitation. No evidence of mitral valve stenosis. MV peak  gradient, 10.2 mmHg. The mean  mitral valve gradient is 3.0 mmHg.   Tricuspid Valve: The tricuspid valve is normal in structure. Tricuspid  valve regurgitation is mild . No evidence of tricuspid stenosis.   Aortic  Valve: The aortic valve has been repaired/replaced. Aortic valve  regurgitation is mild. No aortic stenosis is present. Aortic valve mean  gradient measures 12.0 mmHg. Aortic valve peak gradient measures 25.9  mmHg. There is a unknown St. Jude  mechanical valve present in the aortic position. Procedure Date:  04/13/2006.   Pulmonic Valve: The pulmonic valve was normal in structure. Pulmonic valve  regurgitation is mild. No evidence of pulmonic stenosis.   Aorta: The aortic root is normal in size and structure.   Venous: The inferior vena cava was not well visualized.   IAS/Shunts: No atrial level shunt detected by color flow Doppler.   Additional Comments: Moderate global reduction in LV systolic function;  s/p AVR with mild AI and mean gradient 12 mmHg; biatrial enlargement; mild  TR.       Laboratory Data:  High Sensitivity Troponin:  No results for input(s): "TROPONINIHS" in the last 720 hours.   Chemistry Recent Labs  Lab 02/24/23 1105 02/24/23 1115  NA 137 137  K 3.9 4.0  CL 98 99  CO2 28  --  GLUCOSE 111* 105*  BUN 33* 38*  CREATININE 2.02* 2.40*  CALCIUM 10.1  --   GFRNONAA 24*  --   ANIONGAP 11  --     Recent Labs  Lab 02/24/23 1105  PROT 6.5  ALBUMIN 3.5  AST 30  ALT 15  ALKPHOS 75  BILITOT 0.4   Lipids No results for input(s): "CHOL", "TRIG", "HDL", "LABVLDL", "LDLCALC", "CHOLHDL" in the last 168 hours.  Hematology Recent Labs  Lab 02/24/23 1105 02/24/23 1115  WBC 7.6  --   RBC 3.37*  --   HGB 11.2* 11.6*  HCT 35.3* 34.0*  MCV 104.7*  --   MCH 33.2  --   MCHC 31.7  --   RDW 13.6  --   PLT 202  --    Thyroid No results for input(s): "TSH", "FREET4" in the last 168 hours.  BNPNo results for input(s): "BNP", "PROBNP" in the last 168 hours.  DDimer No results for input(s): "DDIMER" in the last 168 hours.   Radiology/Studies:  CT CHEST ABDOMEN PELVIS WO CONTRAST  Result Date: 02/24/2023 CLINICAL DATA:  Fall.  Left chest wall pain.  On  Coumadin. EXAM: CT CHEST, ABDOMEN AND PELVIS WITHOUT CONTRAST TECHNIQUE: Multidetector CT imaging of the chest, abdomen and pelvis was performed following the standard protocol without IV contrast. RADIATION DOSE REDUCTION: This exam was performed according to the departmental dose-optimization program which includes automated exposure control, adjustment of the mA and/or kV according to patient size and/or use of iterative reconstruction technique. COMPARISON:  02/24/2023 chest radiograph. 02/05/2022 abdominopelvic CT. Most recent chest CT of 12/05/2018 FINDINGS: CT CHEST FINDINGS Cardiovascular: Multifactorial degradation, including arm position and lack of IV contrast. Overlying wires and leads. Aortic atherosclerosis. Tortuous thoracic aorta. Moderate cardiomegaly. Aortic valve repair. Multivessel coronary artery atherosclerosis. Median sternotomy. Mediastinum/Nodes: No mediastinal or hilar adenopathy, given limitations of unenhanced CT. Large hiatal hernia, with approximately 1/2 of the stomach positioned in the lower chest. Lungs/Pleura: Trace left-sided pleural fluid or thickening. No pneumothorax.  Volume loss in the left lower lobe. 5 mm left lower lobe pulmonary nodule on 107/4 is similar over multiple prior exams, can be presumed benign and do/does not warrant imaging follow-up per Fleischner criteria. No pulmonary contusion or pneumothorax. Subpleural and basilar predominant interstitial thickening may represent mild interstitial lung disease. Musculoskeletal: Left breast/chest wall heterogeneous soft tissue density and calcifications including on 36/3 are progressive since but present on 12/05/2018. Left distal clavicle irregularity on 06/03 is new since 2019. Remote posterior lower right rib fractures. Concavity involving T2 and T3 superior endplates, similar to 2019. CT ABDOMEN PELVIS FINDINGS Hepatobiliary: Hepatic hypoattenuation. Tiny hepatic cysts. Normal gallbladder, without biliary ductal  dilatation. Pancreas: 1.7 cm pancreatic uncinate process lesion on 64/3 is suboptimally evaluated but grossly similar to on the prior. No duct dilatation or acute inflammation. Spleen: Normal in size, without focal abnormality. Adrenals/Urinary Tract: Normal right adrenal gland. Mild left adrenal thickening. Mild renal cortical thinning bilaterally. Punctate right renal collecting system calculi. Moderate left-sided hydronephrosis with large left renal collecting system stone burden. A dominant stone at the left ureteropelvic junction measures 1.2 cm and is relatively similar. No bladder calculi. Distal right ureteric stone measures 6 mm. Minimal distal right hydroureter. Stomach/Bowel: Normal remainder of the stomach. 6 cm stool ball within the rectum. Extensive colonic diverticulosis. Normal terminal ileum. Suspected tiny appendix with appendicolith within. No surrounding inflammation. Normal small bowel. Vascular/Lymphatic: Aortic atherosclerosis. Multiple mesenteric calcifications and small nodes. A partially calcified jejunal mesenteric node measures 1.4  x 1.7 cm on 71/3 and is increased from 7 mm on 50/3 of the 02/05/2022 exam. No pelvic sidewall adenopathy. Reproductive: Hysterectomy.  No adnexal mass. Other: No significant free fluid.  No free intraperitoneal air. Musculoskeletal: Mild osteopenia. Grade 1 L4-5 anterolisthesis with advanced lumbosacral spondylosis. Moderate lumbar spine curvature, convex left. IMPRESSION: 1. Multifactorial degradation as detailed above. 2. Distal left clavicular deformity is new since 2019 but favored to be nonacute. Correlate with point tenderness. 3. Otherwise no acute posttraumatic deformity identified. 4. Moderate left-sided hydronephrosis secondary to dominant stone at the left ureteropelvic junction. 5. Mild right distal hydroureter secondary to a distal right ureteric 6 mm stone. 6. Large hiatal hernia 7. Similar abnormal appearance of the anterior left chest wall  since 02/05/2022. Not felt to be posttraumatic. 8. Extensive mesenteric calcifications with a calcified dominant nodule or node which has enlarged over prior exams. Although this could simply represent the sequelae of old granulomatous disease, metastatic carcinoid could also have this appearance. Correlate with patient's symptoms. Consider nonemergent outpatient Dotatate PET. 9.  Possible constipation or even fecal impaction. 10. Coronary artery atherosclerosis. Aortic Atherosclerosis (ICD10-I70.0). 11. Hepatic hypoattenuation is nonspecific but can be seen with amiodarone usage. 12. Ongoing stability of pancreatic uncinate process 1.7 cm cystic lesion which can be presumed benign and does not warrant follow-up imaging. Electronically Signed   By: Abigail Miyamoto M.D.   On: 02/24/2023 15:20   DG Cerv Spine Flex&Ext Only  Result Date: 02/24/2023 CLINICAL DATA:  Neck pain.  Trauma EXAM: CERVICAL SPINE - FLEXION AND EXTENSION VIEWS ONLY COMPARISON:  Standard cervical x-ray 05/17/2016. Cervical spine CT 02/24/2023 FINDINGS: Limited study as only flexion and extension views were obtained. C1 through C7 is present on the two views. Preserved vertebral body heights and prevertebral soft tissues. Moderate disc height loss at C5-6 and mild C6-7. Associated endplate osteophytes. Hypertrophic changes at the dens C1 interval. No significant subluxation on these images. IMPRESSION: Degenerative changes. No clear simple fixation. Please correlate with findings at separate cervical spine CT from same date Electronically Signed   By: Jill Side M.D.   On: 02/24/2023 14:46   DG Pelvis Portable  Result Date: 02/24/2023 CLINICAL DATA:  Fall, pain post fall. EXAM: PORTABLE PELVIS 1-2 VIEWS COMPARISON:  None Available. FINDINGS: There is no evidence of pelvic fracture or diastasis. No pelvic bone lesions are seen. Multilevel degenerate disc disease of the lower lumbar spine. IMPRESSION: 1. No acute fracture or malalignment. 2.  Multilevel degenerate disc disease of the lower lumbar spine. Electronically Signed   By: Keane Police D.O.   On: 02/24/2023 13:11   DG Clavicle Left  Result Date: 02/24/2023 CLINICAL DATA:  Left shoulder pain post fall EXAM: LEFT CLAVICLE - 2+ VIEWS COMPARISON:  None Available. FINDINGS: There is displaced fracture of the distal clavicle glenohumeral joint appear intact. Low lung volumes. Soft tissues are unremarkable. IMPRESSION: Displaced fracture of the distal clavicle. Electronically Signed   By: Keane Police D.O.   On: 02/24/2023 12:55   DG Chest Port 1 View  Result Date: 02/24/2023 CLINICAL DATA:  Pain after trauma EXAM: PORTABLE CHEST 1 VIEW COMPARISON:  07/01/2022 x-ray FINDINGS: Sternal wires. Underinflation. Borderline cardiopericardial silhouette with calcified tortuous aorta. Calcifications along the mitral valve annulus. No consolidation, pneumothorax, effusion or edema. Focal deformity of the distal aspect of the left clavicle not clearly seen previously. Prominent calcifications of the tracheobronchial tree. IMPRESSION: Postop chest.  Underinflation.  Borderline size heart. Deformity of the distal aspect of the  left clavicle not clearly seen on the prior x-ray. Dedicated x-rays of the clavicle or shoulder as clinically directed Electronically Signed   By: Jill Side M.D.   On: 02/24/2023 12:05   CT HEAD WO CONTRAST (5MM)  Result Date: 02/24/2023 CLINICAL DATA:  Trauma EXAM: CT HEAD WITHOUT CONTRAST TECHNIQUE: Contiguous axial images were obtained from the base of the skull through the vertex without intravenous contrast. RADIATION DOSE REDUCTION: This exam was performed according to the departmental dose-optimization program which includes automated exposure control, adjustment of the mA and/or kV according to patient size and/or use of iterative reconstruction technique. COMPARISON:  07/01/2022. FINDINGS: Brain: Petechial hemorrhage identified at the vertex on the right as well as in  the right frontal lobe consistent with areas of contusion. No mass effect or shift. There is extra-axial fluid along the right frontotemporal region consistent with a tiny amount of subdural hematoma about 4 mm thick. There is right frontotemporal hyperdense subcutaneous collection consistent with cephalohematoma. No hydrocephalus. No shift. Vascular: No hyperdense vessel or unexpected calcification. Skull: Normal. Negative for fracture or focal lesion. Sinuses/Orbits: No acute finding. Other: None. IMPRESSION: 1. Involutional changes and chronic periventricular white-matter small-vessel ischemia. 2. Very small right frontotemporal subdural hematoma. No associated mass effect or shift. 3. Petechial hemorrhages consistent with contusion noted right frontal lobe and vertex. Findings were discussed with and acknowledged by Dr. Sherry Ruffing. Electronically Signed   By: Sammie Bench M.D.   On: 02/24/2023 12:05   CT Cervical Spine Wo Contrast  Result Date: 02/24/2023 CLINICAL DATA:  Neck trauma EXAM: CT CERVICAL SPINE WITHOUT CONTRAST TECHNIQUE: Multidetector CT imaging of the cervical spine was performed without intravenous contrast. Multiplanar CT image reconstructions were also generated. RADIATION DOSE REDUCTION: This exam was performed according to the departmental dose-optimization program which includes automated exposure control, adjustment of the mA and/or kV according to patient size and/or use of iterative reconstruction technique. COMPARISON:  07/01/2022 FINDINGS: Alignment: Accentuated cervical lordosis Skull base and vertebrae: No acute fracture. No primary bone lesion or focal pathologic process. T2 and T3 chronic superior endplate fractures. Soft tissues and spinal canal: No prevertebral fluid or swelling. No visible canal hematoma. Disc levels: Degenerative facet spurring asymmetric to the left at C2-3 and C3-4. Upper chest: No acute finding IMPRESSION: No evidence of acute intracranial or cervical  spine injury. Electronically Signed   By: Jorje Guild M.D.   On: 02/24/2023 11:55     Assessment and Plan:   Jennetta Freshour is a 82 y.o. female with a hx of hypertension, atrial fibrillation, PVCs, sinus bradycardia, chronic combined systolic and diastolic CHF, CKD stage III, status post mechanical Saint Jude aortic valve who is being seen 02/24/2023 for the evaluation of anticoagulation at the request of trauma MD.   1  AV dz   PT with mechanical AVR   INR today 2.4   Per neurosurgery ok to continue coumadin   Will follow closely     2  Rhythm   Pt in / out SR and possible atrial tach vs flutter      Keep on coumadin   3 Hx of HTN  BP controlled     4  Hx HFrEF    LVEF 30 to 35% on echo done in 2021.  Volume status appears OK   HOlding lasix for now   Follow I/O, BP   Risk Assessment/Risk Scores:    CHA2DS2-VASc Score = 5   This indicates a 7.2% annual risk of stroke. The  patient's score is based upon: CHF History: 1 HTN History: 1 Diabetes History: 0 Stroke History: 0 Vascular Disease History: 0 Age Score: 2 Gender Score: 1  For questions or updates, please contact [place] Please consult [email or web address] for contact info under

## 2023-02-24 NOTE — Consult Note (Signed)
Reason for Consult: Head trauma with small intracranial hemorrhage Referring Physician: Dr. Inocente Salles [name] is an 82 y.o. female.  HPI: Patient is an 82 year old right-handed individual who had a fall from standing height today.  She hit the right frontal region of her head.  The patient is on anticoagulation with Coumadin can [place] to an artificial heart valve.  CT scan of the head was performed which demonstrates that there is a small amount of petechial hemorrhage in the right frontal region and a very tiny thin subdural layer of blood.  There is no evidence of any shift or mass effect.  Past Medical History:  Diagnosis Date   BPV (benign positional vertigo)    Chronic neck pain    C2-3 arthropathy   CKD (chronic kidney disease), stage II    Diverticulosis of colon    GERD (gastroesophageal reflux disease)    Hiatal hernia    History of breast cancer per pt no recurrence   dx 2004-- Left breast DCIS (ER/PR+, HER2 negative) s/p  partial masectomy w/ sln bx and  chemoradiotion (completed 2004)   History of herpes zoster    History of kidney stones    HTN (hypertension)    Hyperlipidemia    Irritable bowel syndrome    LBBB (left bundle branch block)    PAF (paroxysmal atrial fibrillation) (HCC)    a. CHA2DS2VASc = 4-->coumadin.   Parkinsonism    neurologist-  dr tat   PONV (postoperative nausea and vomiting)    S/P aortic valve replacement with prosthetic valve    a. 04/13/2006 s/p St Jude mechnical AVR for severe AS;  b. 09/2014 Echo: EF 40-45%, gr1 DD, mild AI/MR, triv TR/PR.   Squamous cell carcinoma of skin 04/06/2022   right nasal sidewall   SUI (stress urinary incontinence, female)    Symptomatic anemia    Venous varices      Past Surgical History:  Procedure Laterality Date   ANTERIOR AND POSTERIOR REPAIR  1990   cystocele/ rectocele   AORTIC VALVE REPLACEMENT  04/13/06   dr gerhardt   and Replacement Ascending Aorta (St. Jude mechanical prosthesis)    APPENDECTOMY  child 1950   BREAST EXCISIONAL BIOPSY Left    BREAST LUMPECTOMY Left 01/11/2003   malignant   BREAST SURGERY Left 2004   CARDIAC CATHETERIZATION  02-22-2001   dr Martinique   minimal non-obstructive cad/  mild aortic stenosis and aortic root enlargement/  normal LVF, ef 65%   CARDIAC CATHETERIZATION  03-22-2006    dr Martinique   no significant obstructive cad/  severe aortic stenosis and mild to moderate aortic root enlargement/  upper normal right heart pressures   CARDIOVASCULAR STRESS TEST  09-30-2011   dr Martinique   lexiscan nuclear study w/ apical thinning  vs  small prior apical infarct w/ no significant ischemia/  hypokinesis of the distal septum and apex, ef 50%   CARPAL TUNNEL RELEASE Bilateral left 09-09-2004/  right 2007   CATARACT EXTRACTION W/ INTRAOCULAR LENS  IMPLANT, BILATERAL  2015   COLONOSCOPY N/A 10/03/2014   Procedure: COLONOSCOPY;  Surgeon: [name], MD;  Location: [facility];  Service: Endoscopy;  Laterality: N/A;   CYSTOSCOPY WITH RETROGRADE PYELOGRAM, URETEROSCOPY AND STENT PLACEMENT Right 09/17/2016   Procedure: CYSTOSCOPY WITH RIGHT RETROGRADE PYELOGRAM, RIGHT URETEROSCOPY AND STENT PLACEMENT LASER LITHOTRIPSY, RIGHT STENT PLACEMENT;  Surgeon: [name], MD;  Location: [facility];  Service: Urology;  Laterality: Right;   ESOPHAGEAL MANOMETRY N/A  08/12/2014   Procedure: ESOPHAGEAL MANOMETRY (EM);  Surgeon: [name], MD;  Location: [facility];  Service: Endoscopy;  Laterality: N/A;   ESOPHAGOGASTRODUODENOSCOPY N/A 10/03/2014   Procedure: ESOPHAGOGASTRODUODENOSCOPY (EGD);  Surgeon: [name], MD;  Location: [facility];  Service: Endoscopy;  Laterality: N/A;   ESOPHAGOGASTRODUODENOSCOPY N/A 12/08/2018   Procedure: ESOPHAGOGASTRODUODENOSCOPY (EGD);  Surgeon: Doran Stabler, MD;  Location: [facility];  Service: Gastroenterology;  Laterality: N/A;   EXTRACORPOREAL SHOCK WAVE LITHOTRIPSY  1997   FOOT SURGERY Right 2013    hammertoe x2   HOLMIUM LASER APPLICATION Right [id number]   Procedure: HOLMIUM LASER APPLICATION;  Surgeon: [name], MD;  Location: [facility];  Service: Urology;  Laterality: Right;   PERCUTANEOUS NEPHROSTOLITHOTOMY  1993   STONE EXTRACTION WITH BASKET Right 09/17/2016   Procedure: STONE EXTRACTION WITH BASKET;  Surgeon: [name], MD;  Location: [facility];  Service: Urology;  Laterality: Right;   TOTAL ABDOMINAL HYSTERECTOMY  1971   w/ Bilateral Salpingoophorectomy   TRANSTHORACIC ECHOCARDIOGRAM  10/24/2014   dr Martinique   hypokinesis of the basal and mid inferoseptal and inferior walls,  ef 40-45%,  grade 1 diastolic dysfunction/  mechanical bileaflet aortic valve noted w/ mild regur., peak grandient 33mHg, valve area 1.35cm^2/  severe MV calcification without stenosis w/ mild regurg., peak gradient 367mg/  mild LAE/  poss. atrial mass (MRI showed benign lipomatous hypertrophy of atrial septum) /tFrazier ButtR/mild TR    Family History  Problem Relation Age of Onset   Heart disease Mother    Heart disease Father 769     CABG   Other Brother        AVR   Healthy Child     Social History:  reports that she has never smoked. She has never used smokeless tobacco. She reports that she does not drink alcohol and does not use drugs.  Allergies:  Allergies  Allergen Reactions   Demerol [Meperidine] Shortness Of Breath and Swelling   Oxycodone Other (See Comments)    Hallucinations    Adhesive [Tape] Other (See Comments)    Redness and skin tears   Alendronate     Other reaction(s): heartburn   Ivp Dye [Iodinated Contrast Media] Hives    OK with benadryl pre-med ('50mg'$  one hour before receiving iodinated contrast agent)   Losartan     Other reaction(s): dizzy   Phenergan [Promethazine] Other (See Comments)    Avoids due to reaction with current medications    Medications: I have reviewed the patient's current medications.  Results  for orders placed or performed during the hospital encounter of 02/24/23 (from the past 48 hour(s))  Comprehensive metabolic panel     Status: Abnormal   Collection Time: 02/24/23 11:05 AM  Result Value Ref Range   Sodium 137 135 - 145 mmol/L   Potassium 3.9 3.5 - 5.1 mmol/L   Chloride 98 98 - 111 mmol/L   CO2 28 22 - 32 mmol/L   Glucose, Bld 111 (H) 70 - 99 mg/dL    Comment: Glucose reference range applies only to samples taken after fasting for at least 8 hours.   BUN 33 (H) 8 - 23 mg/dL   Creatinine, Ser 2.02 (H) 0.44 - 1.00 mg/dL   Calcium 10.1 8.9 - 10.3 mg/dL   Total Protein 6.5 6.5 - 8.1 g/dL   Albumin 3.5 3.5 - 5.0 g/dL   AST 30 15 - 41 U/L   ALT 15  0 - 44 U/L   Alkaline Phosphatase 75 38 - 126 U/L   Total Bilirubin 0.4 0.3 - 1.2 mg/dL   GFR, Estimated 24 (L) >60 mL/min    Comment: (NOTE) Calculated using the CKD-EPI Creatinine Equation (2021)    Anion gap 11 5 - 15    Comment: Performed at [facility] [address] [place], [place] [address]  CBC     Status: Abnormal   Collection Time: 02/24/23 11:05 AM  Result Value Ref Range   WBC 7.6 4.0 - 10.5 K/uL   RBC 3.37 (L) 3.87 - 5.11 MIL/uL   Hemoglobin 11.2 (L) 12.0 - 15.0 g/dL   HCT 35.3 (L) 36.0 - 46.0 %   MCV 104.7 (H) 80.0 - 100.0 fL   MCH 33.2 26.0 - 34.0 pg   MCHC 31.7 30.0 - 36.0 g/dL   RDW 13.6 11.5 - 15.5 %   Platelets 202 150 - 400 K/uL   nRBC 0.0 0.0 - 0.2 %    Comment: Performed at [facility], [place] [address], [place], [place] [address]  Protime-INR     Status: Abnormal   Collection Time: 02/24/23 11:05 AM  Result Value Ref Range   Prothrombin Time 26.0 (H) 11.4 - 15.2 seconds   INR 2.4 (H) 0.8 - 1.2    Comment: (NOTE) INR goal varies based on device and disease states. Performed at [facility], [place] [address], [place], [place] [address]   Type and screen [place]     Status: None   Collection Time: 02/24/23 11:05 AM  Result Value Ref Range   ABO/RH(D) O  POS    Antibody Screen NEG    Sample Expiration      02/27/2023,2359 Performed at [facility], [place] [address], [place], [place] [address]   I-stat chem 8, ed     Status: Abnormal   Collection Time: 02/24/23 11:15 AM  Result Value Ref Range   Sodium 137 135 - 145 mmol/L   Potassium 4.0 3.5 - 5.1 mmol/L   Chloride 99 98 - 111 mmol/L   BUN 38 (H) 8 - 23 mg/dL   Creatinine, Ser 2.40 (H) 0.44 - 1.00 mg/dL   Glucose, Bld 105 (H) 70 - 99 mg/dL    Comment: Glucose reference range applies only to samples taken after fasting for at least 8 hours.   Calcium, Ion 1.26 1.15 - 1.40 mmol/L   TCO2 32 22 - 32 mmol/L   Hemoglobin 11.6 (L) 12.0 - 15.0 g/dL   HCT 34.0 (L) 36.0 - 46.0 %  Ethanol     Status: None   Collection Time: 02/24/23 11:21 AM  Result Value Ref Range   Alcohol, Ethyl (B) <10 <10 mg/dL    Comment: (NOTE) Lowest detectable limit for serum alcohol is 10 mg/dL.  For medical purposes only. Performed at [facility], [place] [address], [place], [place] [address]   Lactic acid, plasma     Status: None   Collection Time: 02/24/23 11:21 AM  Result Value Ref Range   Lactic Acid, Venous 1.2 0.5 - 1.9 mmol/L    Comment: Performed at [facility] [address], [place], [place] [address]    DG Pelvis Portable  Result Date: 02/24/2023 CLINICAL DATA:  Fall, pain post fall. EXAM: PORTABLE PELVIS 1-2 VIEWS COMPARISON:  None Available. FINDINGS: There is no evidence of pelvic fracture or diastasis. No pelvic bone lesions are seen.  Multilevel degenerate disc disease of the lower lumbar spine. IMPRESSION: 1. No acute fracture or malalignment. 2. Multilevel degenerate disc disease of the lower lumbar spine. Electronically Signed   By: Keane Police D.O.   On: 02/24/2023 13:11   DG Clavicle Left  Result Date: 02/24/2023 CLINICAL DATA:  Left shoulder pain post fall EXAM: LEFT CLAVICLE - 2+ VIEWS COMPARISON:  None Available. FINDINGS: There is displaced fracture of the distal  clavicle glenohumeral joint appear intact. Low lung volumes. Soft tissues are unremarkable. IMPRESSION: Displaced fracture of the distal clavicle. Electronically Signed   By: Keane Police D.O.   On: 02/24/2023 12:55   DG Chest Port 1 View  Result Date: 02/24/2023 CLINICAL DATA:  Pain after trauma EXAM: PORTABLE CHEST 1 VIEW COMPARISON:  07/01/2022 x-ray FINDINGS: Sternal wires. Underinflation. Borderline cardiopericardial silhouette with calcified tortuous aorta. Calcifications along the mitral valve annulus. No consolidation, pneumothorax, effusion or edema. Focal deformity of the distal aspect of the left clavicle not clearly seen previously. Prominent calcifications of the tracheobronchial tree. IMPRESSION: Postop chest.  Underinflation.  Borderline size heart. Deformity of the distal aspect of the left clavicle not clearly seen on the prior x-ray. Dedicated x-rays of the clavicle or shoulder as clinically directed Electronically Signed   By: Jill Side M.D.   On: 02/24/2023 12:05   CT HEAD WO CONTRAST (5MM)  Result Date: 02/24/2023 CLINICAL DATA:  Trauma EXAM: CT HEAD WITHOUT CONTRAST TECHNIQUE: Contiguous axial images were obtained from the base of the skull through the vertex without intravenous contrast. RADIATION DOSE REDUCTION: This exam was performed according to the departmental dose-optimization program which includes automated exposure control, adjustment of the mA and/or kV according to patient size and/or use of iterative reconstruction technique. COMPARISON:  07/01/2022. FINDINGS: Brain: Petechial hemorrhage identified at the vertex on the right as well as in the right frontal lobe consistent with areas of contusion. No mass effect or shift. There is extra-axial fluid along the right frontotemporal region consistent with a tiny amount of subdural hematoma about 4 mm thick. There is right frontotemporal hyperdense subcutaneous collection consistent with cephalohematoma. No hydrocephalus. No  shift. Vascular: No hyperdense vessel or unexpected calcification. Skull: Normal. Negative for fracture or focal lesion. Sinuses/Orbits: No acute finding. Other: None. IMPRESSION: 1. Involutional changes and chronic periventricular white-matter small-vessel ischemia. 2. Very small right frontotemporal subdural hematoma. No associated mass effect or shift. 3. Petechial hemorrhages consistent with contusion noted right frontal lobe and vertex. Findings were discussed with and acknowledged by Dr. Sherry Ruffing. Electronically Signed   By: Sammie Bench M.D.   On: 02/24/2023 12:05   CT Cervical Spine Wo Contrast  Result Date: 02/24/2023 CLINICAL DATA:  Neck trauma EXAM: CT CERVICAL SPINE WITHOUT CONTRAST TECHNIQUE: Multidetector CT imaging of the cervical spine was performed without intravenous contrast. Multiplanar CT image reconstructions were also generated. RADIATION DOSE REDUCTION: This exam was performed according to the departmental dose-optimization program which includes automated exposure control, adjustment of the mA and/or kV according to patient size and/or use of iterative reconstruction technique. COMPARISON:  07/01/2022 FINDINGS: Alignment: Accentuated cervical lordosis Skull base and vertebrae: No acute fracture. No primary bone lesion or focal pathologic process. T2 and T3 chronic superior endplate fractures. Soft tissues and spinal canal: No prevertebral fluid or swelling. No visible canal hematoma. Disc levels: Degenerative facet spurring asymmetric to the left at C2-3 and C3-4. Upper chest: No acute finding IMPRESSION: No evidence of acute intracranial or cervical spine injury. Electronically Signed   By: Roderic Palau  Watts M.D.   On: 02/24/2023 11:55    Review of Systems  Unable to perform ROS: Acuity of condition   Blood pressure 121/72, pulse 80, temperature 98.2 F (36.8 C), temperature source Oral, resp. rate 16, height 5' (1.524 m), weight 54 kg, SpO2 92 %. Physical Exam Constitutional:       Appearance: She is normal weight.  HENT:     Head: Normocephalic.     Comments: Right frontal subgaleal hematoma with ecchymoses and small incision that has been oversewn and stapled at the current time was a site of some arterial bleeding.    Right Ear: Tympanic membrane, ear canal and external ear normal.     Left Ear: Tympanic membrane, ear canal and external ear normal.     Nose: Nose normal.     Mouth/Throat:     Mouth: Mucous membranes are moist.     Pharynx: Oropharynx is clear.  Eyes:     Extraocular Movements: Extraocular movements intact.     Conjunctiva/sclera: Conjunctivae normal.     Pupils: Pupils are equal, round, and reactive to light.  Abdominal:     General: Abdomen is flat.     Palpations: Abdomen is soft.  Skin:    General: Skin is warm and dry.     Capillary Refill: Capillary refill takes less than 2 seconds.  Neurological:     Mental Status: She is alert.     Comments: Alert and oriented.  Moving all 4 extremities well.  No evidence of a cortical drift.     Assessment/Plan: Impression: Patient has a very tiny amount of blood from cerebral contusion and the small amount of subdural blood in the right frontotemporal region.  There is no shift or mass effect.  I believe we can cautiously continue her present anticoagulation without reversal to protect her valve.  We can simply observe her situation clinically.  Repeat CT scan should be performed in 12 hours for follow-up.  I discussed the situation with her husband briefly and we will follow along from neurosurgery.  Blanchie Dessert Wilburt Messina 02/24/2023, 1:29 PM

## 2023-02-24 NOTE — ED Triage Notes (Signed)
Pt came through front door bleeding significantly. Pt states she fell while getting out of car. Pt is on coumadin. Pt is axox4. VSS. Pt saturated with blood, major head laceration to right side of head.

## 2023-02-24 NOTE — ED Notes (Signed)
Trauma Response Nurse Documentation   [name] is a 82 y.o. female arriving to [facility] ED via EMS  On warfarin daily. Trauma was activated as a Level 2 by ED Charge RN based on the following trauma criteria Elderly patients > 65 with head trauma on anti-coagulation (excluding ASA). Trauma team at the bedside on patient arrival.   Patient cleared for CT by Dr. Sherry Ruffing. Pt transported to CT with trauma response nurse present to monitor. RN remained with the patient throughout their absence from the department for clinical observation.   GCS 15.  History   Past Medical History:  Diagnosis Date   BPV (benign positional vertigo)    Chronic neck pain    C2-3 arthropathy   CKD (chronic kidney disease), stage II    Diverticulosis of colon    GERD (gastroesophageal reflux disease)    Hiatal hernia    History of breast cancer per pt no recurrence   dx 2004-- Left breast DCIS (ER/PR+, HER2 negative) s/p  partial masectomy w/ sln bx and  chemoradiotion (completed 2004)   History of herpes zoster    History of kidney stones    HTN (hypertension)    Hyperlipidemia    Irritable bowel syndrome    LBBB (left bundle branch block)    PAF (paroxysmal atrial fibrillation) (HCC)    a. CHA2DS2VASc = 4-->coumadin.   Parkinsonism    neurologist-  dr tat   PONV (postoperative nausea and vomiting)    S/P aortic valve replacement with prosthetic valve    a. 04/13/2006 s/p St Jude mechnical AVR for severe AS;  b. 09/2014 Echo: EF 40-45%, gr1 DD, mild AI/MR, triv TR/PR.   Squamous cell carcinoma of skin 04/06/2022   right nasal sidewall   SUI (stress urinary incontinence, female)    Symptomatic anemia    Venous varices       Past Surgical History:  Procedure Laterality Date   ANTERIOR AND POSTERIOR REPAIR  1990   cystocele/ rectocele   AORTIC VALVE REPLACEMENT  04/13/06   dr gerhardt   and Replacement Ascending Aorta (St. Jude mechanical prosthesis)   APPENDECTOMY  child 1950    BREAST EXCISIONAL BIOPSY Left    BREAST LUMPECTOMY Left 01/11/2003   malignant   BREAST SURGERY Left 2004   CARDIAC CATHETERIZATION  02-22-2001   dr Martinique   minimal non-obstructive cad/  mild aortic stenosis and aortic root enlargement/  normal LVF, ef 65%   CARDIAC CATHETERIZATION  03-22-2006    dr Martinique   no significant obstructive cad/  severe aortic stenosis and mild to moderate aortic root enlargement/  upper normal right heart pressures   CARDIOVASCULAR STRESS TEST  09-30-2011   dr Martinique   lexiscan nuclear study w/ apical thinning  vs  small prior apical infarct w/ no significant ischemia/  hypokinesis of the distal septum and apex, ef 50%   CARPAL TUNNEL RELEASE Bilateral left 09-09-2004/  right 2007   CATARACT EXTRACTION W/ INTRAOCULAR LENS  IMPLANT, BILATERAL  2015   COLONOSCOPY N/A 10/03/2014   Procedure: COLONOSCOPY;  Surgeon: [name], MD;  Location: [place];  Service: Endoscopy;  Laterality: N/A;   CYSTOSCOPY WITH RETROGRADE PYELOGRAM, URETEROSCOPY AND STENT PLACEMENT Right 09/17/2016   Procedure: CYSTOSCOPY WITH RIGHT RETROGRADE PYELOGRAM, RIGHT URETEROSCOPY AND STENT PLACEMENT LASER LITHOTRIPSY, RIGHT STENT PLACEMENT;  Surgeon: [name], MD;  Location: [facility];  Service: Urology;  Laterality: Right;   ESOPHAGEAL MANOMETRY N/A 08/12/2014   Procedure:  ESOPHAGEAL MANOMETRY (EM);  Surgeon: [name], MD;  [facility];  Service: Endoscopy;  Laterality: N/A;   ESOPHAGOGASTRODUODENOSCOPY N/A 10/03/2014   Procedure: ESOPHAGOGASTRODUODENOSCOPY (EGD);  Surgeon: [name], MD;  Location: [facility];  Service: Endoscopy;  Laterality: N/A;   ESOPHAGOGASTRODUODENOSCOPY N/A 12/08/2018   Procedure: ESOPHAGOGASTRODUODENOSCOPY (EGD);  Surgeon: Doran Stabler, MD;  Location: [place];  Service: Gastroenterology;  Laterality: N/A;   EXTRACORPOREAL SHOCK WAVE LITHOTRIPSY  1997   FOOT SURGERY Right 2013   hammertoe x2   HOLMIUM  LASER APPLICATION Right [id number]   Procedure: HOLMIUM LASER APPLICATION;  Surgeon: [name], MD;  Location: [facility];  Service: Urology;  Laterality: Right;   PERCUTANEOUS NEPHROSTOLITHOTOMY  1993   STONE EXTRACTION WITH BASKET Right 09/17/2016   Procedure: STONE EXTRACTION WITH BASKET;  Surgeon: [name], MD;  Location[facility];  Service: Urology;  Laterality: Right;   TOTAL ABDOMINAL HYSTERECTOMY  1971   w/ Bilateral Salpingoophorectomy   TRANSTHORACIC ECHOCARDIOGRAM  10/24/2014   dr Martinique   hypokinesis of the basal and mid inferoseptal and inferior walls,  ef 40-45%,  grade 1 diastolic dysfunction/  mechanical bileaflet aortic valve noted w/ mild regur., peak grandient 53mHg, valve area 1.35cm^2/  severe MV calcification without stenosis w/ mild regurg., peak gradient 356mg/  mild LAE/  poss. atrial mass (MRI showed benign lipomatous hypertrophy of atrial septum) /tFrazier ButtR/mild TR     Initial Focused Assessment (If applicable, or please see trauma documentation): - GCS 15 - PERRLA - Pulsatile bleeding to R forehead  CT's Completed:   CT Head and CT C-Spine   Interventions:  - PIV inserted  - Pressure held to forehead bleeding - c-collar applied - forehead wound sutured - consulted Dr. ThGrandville Silosor help with expanding hematoma to forehead - Resutured by Dr. ThGrandville Silos - CT head and c-spine - Admit to 4N ICU  Plan for disposition:  Admission to ICU   Consults completed:  Neurosurgeon at 1329.  Event Summary: - Pt was at a voting location when she went to shut the car door and fell, striking her head.  Pt denies LOC.   Bedside handoff with ED RN ToHerbert Spires   WAClovis CaoTrauma Response RN  Please call TRN at [phone] further assistance.

## 2023-02-24 NOTE — ED Notes (Signed)
ED TO INPATIENT HANDOFF REPORT  ED Nurse Name and Phone #:   S Name/Age/Gender [name] 82 y.o. female Room/Bed: TRAAC/TRAAC  Code Status   Code Status: Full Code  Home/SNF/Other Home Patient oriented to: self, place, time, and situation Is this baseline? Yes   Triage Complete: Triage complete  Chief Complaint Fall [W19.XXXA]  Triage Note Pt came through front door bleeding significantly. Pt states she fell while getting out of car. Pt is on coumadin. Pt is axox4. VSS. Pt saturated with blood, major head laceration to right side of head.    Allergies Allergies  Allergen Reactions   Demerol [Meperidine] Shortness Of Breath and Swelling   Oxycodone Other (See Comments)    Hallucinations    Adhesive [Tape] Other (See Comments)    Redness and skin tears   Alendronate     Other reaction(s): heartburn   Ivp Dye [Iodinated Contrast Media] Hives    OK with benadryl pre-med ('50mg'$  one hour before receiving iodinated contrast agent)   Losartan     Other reaction(s): dizzy   Phenergan [Promethazine] Other (See Comments)    Avoids due to reaction with current medications    Level of Care/Admitting Diagnosis ED Disposition     ED Disposition  Admit   Condition  --   Comment  Hospital Area: [place] [[id number]]  Level of Care: ICU [6]  May admit patient to [facility] or [facility] if equivalent level of care is available:: No  Covid Evaluation: Asymptomatic - no recent exposure (last 10 days) testing not required  Diagnosis: Fall [290176]  Admitting Physician: TRAUMA MD [place]  Attending Physician: TRAUMA MD [[id number]]  Bed request comments: 4N  Certification:: I certify this patient will need inpatient services for at least 2 midnights  Estimated Length of Stay: 3          B Medical/Surgery History Past Medical History:  Diagnosis Date   BPV (benign positional vertigo)    Chronic neck pain    C2-3 arthropathy   CKD (chronic  kidney disease), stage II    Diverticulosis of colon    GERD (gastroesophageal reflux disease)    Hiatal hernia    History of breast cancer per pt no recurrence   dx 2004-- Left breast DCIS (ER/PR+, HER2 negative) s/p  partial masectomy w/ sln bx and  chemoradiotion (completed 2004)   History of herpes zoster    History of kidney stones    HTN (hypertension)    Hyperlipidemia    Irritable bowel syndrome    LBBB (left bundle branch block)    PAF (paroxysmal atrial fibrillation) ([place])    a. CHA2DS2VASc = 4-->coumadin.   Parkinsonism    neurologist-  dr tat   PONV (postoperative nausea and vomiting)    S/P aortic valve replacement with prosthetic valve    a. 04/13/2006 s/p St Jude mechnical AVR for severe AS;  b. 09/2014 Echo: EF 40-45%, gr1 DD, mild AI/MR, triv TR/PR.   Squamous cell carcinoma of skin 04/06/2022   right nasal sidewall   SUI (stress urinary incontinence, female)    Symptomatic anemia    Venous varices     Past Surgical History:  Procedure Laterality Date   ANTERIOR AND POSTERIOR REPAIR  1990   cystocele/ rectocele   AORTIC VALVE REPLACEMENT  04/13/06   dr gerhardt   and Replacement Ascending Aorta (St. Jude mechanical prosthesis)   APPENDECTOMY  child 1950   BREAST EXCISIONAL BIOPSY Left  BREAST LUMPECTOMY Left 01/11/2003   malignant   BREAST SURGERY Left 2004   CARDIAC CATHETERIZATION  02-22-2001   dr Martinique   minimal non-obstructive cad/  mild aortic stenosis and aortic root enlargement/  normal LVF, ef 65%   CARDIAC CATHETERIZATION  03-22-2006    dr Martinique   no significant obstructive cad/  severe aortic stenosis and mild to moderate aortic root enlargement/  upper normal right heart pressures   CARDIOVASCULAR STRESS TEST  09-30-2011   dr Martinique   lexiscan nuclear study w/ apical thinning  vs  small prior apical infarct w/ no significant ischemia/  hypokinesis of the distal septum and apex, ef 50%   CARPAL TUNNEL RELEASE Bilateral left 09-09-2004/  right  2007   CATARACT EXTRACTION W/ INTRAOCULAR LENS  IMPLANT, BILATERAL  2015   COLONOSCOPY N/A 10/03/2014   Procedure: COLONOSCOPY;  Surgeon: [name], MD;  Location: [place];  Service: Endoscopy;  Laterality: N/A;   CYSTOSCOPY WITH RETROGRADE PYELOGRAM, URETEROSCOPY AND STENT PLACEMENT Right 09/17/2016   Procedure: CYSTOSCOPY WITH RIGHT RETROGRADE PYELOGRAM, RIGHT URETEROSCOPY AND STENT PLACEMENT LASER LITHOTRIPSY, RIGHT STENT PLACEMENT;  Surgeon: [name], MD;  Location: [facility];  Service: Urology;  Laterality: Right;   ESOPHAGEAL MANOMETRY N/A 08/12/2014   Procedure: ESOPHAGEAL MANOMETRY (EM);  Surgeon: [name], MD;  [facility];  Service: Endoscopy;  Laterality: N/A;   ESOPHAGOGASTRODUODENOSCOPY N/A 10/03/2014   Procedure: ESOPHAGOGASTRODUODENOSCOPY (EGD);  Surgeon: [name], MD;  Location: [facility];  Service: Endoscopy;  Laterality: N/A;   ESOPHAGOGASTRODUODENOSCOPY N/A 12/08/2018   Procedure: ESOPHAGOGASTRODUODENOSCOPY (EGD);  Surgeon: Doran Stabler, MD;  Location: [place];  Service: Gastroenterology;  Laterality: N/A;   EXTRACORPOREAL SHOCK WAVE LITHOTRIPSY  1997   FOOT SURGERY Right 2013   hammertoe x2   HOLMIUM LASER APPLICATION Right [id number]   Procedure: HOLMIUM LASER APPLICATION;  Surgeon: [name], MD;  Location: [facility];  Service: Urology;  Laterality: Right;   PERCUTANEOUS NEPHROSTOLITHOTOMY  1993   STONE EXTRACTION WITH BASKET Right 09/17/2016   Procedure: STONE EXTRACTION WITH BASKET;  Surgeon: [name], MD;  Location: [facility];  Service: Urology;  Laterality: Right;   TOTAL ABDOMINAL HYSTERECTOMY  1971   w/ Bilateral Salpingoophorectomy   TRANSTHORACIC ECHOCARDIOGRAM  10/24/2014   dr Martinique   hypokinesis of the basal and mid inferoseptal and inferior walls,  ef 40-45%,  grade 1 diastolic dysfunction/  mechanical bileaflet aortic valve noted w/ mild  regur., peak grandient 61mHg, valve area 1.35cm^2/  severe MV calcification without stenosis w/ mild regurg., peak gradient 339mg/  mild LAE/  poss. atrial mass (MRI showed benign lipomatous hypertrophy of atrial septum) /tFrazier ButtR/mild TR     A IV Location/Drains/Wounds Patient Lines/Drains/Airways Status     Active Line/Drains/Airways     Name Placement date Placement time Site Days   Peripheral IV 02/24/23 20 G Anterior;Right Forearm 02/24/23  1108  Forearm  less than 1   Ureteral Drain/Stent Right ureter 6 Fr. 09/17/16  0914  Right ureter  2351   External Urinary Catheter 02/24/23  1419  --  less than 1   Incision (Closed) 09/17/16 Perineum Other (Comment) 09/17/16  0935  -- 2351   Pressure Injury 02/07/22 Buttocks Left Stage 1 -  Intact skin with non-blanchable redness of a localized area usually over a bony prominence. blisters present x4, redness 02/07/22  1900  -- 382  Intake/Output Last 24 hours No intake or output data in the 24 hours ending 02/24/23 1427  Labs/Imaging Results for orders placed or performed during the hospital encounter of 02/24/23 (from the past 48 hour(s))  Comprehensive metabolic panel     Status: Abnormal   Collection Time: 02/24/23 11:05 AM  Result Value Ref Range   Sodium 137 135 - 145 mmol/L   Potassium 3.9 3.5 - 5.1 mmol/L   Chloride 98 98 - 111 mmol/L   CO2 28 22 - 32 mmol/L   Glucose, Bld 111 (H) 70 - 99 mg/dL    Comment: Glucose reference range applies only to samples taken after fasting for at least 8 hours.   BUN 33 (H) 8 - 23 mg/dL   Creatinine, Ser 2.02 (H) 0.44 - 1.00 mg/dL   Calcium 10.1 8.9 - 10.3 mg/dL   Total Protein 6.5 6.5 - 8.1 g/dL   Albumin 3.5 3.5 - 5.0 g/dL   AST 30 15 - 41 U/L   ALT 15 0 - 44 U/L   Alkaline Phosphatase 75 38 - 126 U/L   Total Bilirubin 0.4 0.3 - 1.2 mg/dL   GFR, Estimated 24 (L) >60 mL/min    Comment: (NOTE) Calculated using the CKD-EPI Creatinine Equation (2021)    Anion gap 11 5 - 15     Comment: Performed at [facility] [address], [place], [place] [address]  CBC     Status: Abnormal   Collection Time: 02/24/23 11:05 AM  Result Value Ref Range   WBC 7.6 4.0 - 10.5 K/uL   RBC 3.37 (L) 3.87 - 5.11 MIL/uL   Hemoglobin 11.2 (L) 12.0 - 15.0 g/dL   HCT 35.3 (L) 36.0 - 46.0 %   MCV 104.7 (H) 80.0 - 100.0 fL   MCH 33.2 26.0 - 34.0 pg   MCHC 31.7 30.0 - 36.0 g/dL   RDW 13.6 11.5 - 15.5 %   Platelets 202 150 - 400 K/uL   nRBC 0.0 0.0 - 0.2 %    Comment: Performed at [facility], [place] [address], [place], [place] [address]  Protime-INR     Status: Abnormal   Collection Time: 02/24/23 11:05 AM  Result Value Ref Range   Prothrombin Time 26.0 (H) 11.4 - 15.2 seconds   INR 2.4 (H) 0.8 - 1.2    Comment: (NOTE) INR goal varies based on device and disease states. Performed at [facility], [place] [address] [place], [place] [address]   Type and screen [place]     Status: None   Collection Time: 02/24/23 11:05 AM  Result Value Ref Range   ABO/RH(D) O POS    Antibody Screen NEG    Sample Expiration      02/27/2023,2359 Performed at [facility], [place] [address], [place], [place] [address]   I-stat chem 8, ed     Status: Abnormal   Collection Time: 02/24/23 11:15 AM  Result Value Ref Range   Sodium 137 135 - 145 mmol/L   Potassium 4.0 3.5 - 5.1 mmol/L   Chloride 99 98 - 111 mmol/L   BUN 38 (H) 8 - 23 mg/dL   Creatinine, Ser 2.40 (H) 0.44 - 1.00 mg/dL   Glucose, Bld 105 (H) 70 - 99 mg/dL    Comment: Glucose reference range applies only to samples taken after fasting for at least 8 hours.   Calcium, Ion 1.26 1.15 - 1.40 mmol/L   TCO2 32  22 - 32 mmol/L   Hemoglobin 11.6 (L) 12.0 - 15.0 g/dL   HCT 34.0 (L) 36.0 - 46.0 %  Ethanol     Status: None   Collection Time: 02/24/23 11:21 AM  Result Value Ref Range   Alcohol, Ethyl (B) <10 <10 mg/dL    Comment: (NOTE) Lowest detectable limit for serum alcohol is 10 mg/dL.  For  medical purposes only. Performed at [facility], [place] [address] [place], [place] [address]   Lactic acid, plasma     Status: None   Collection Time: 02/24/23 11:21 AM  Result Value Ref Range   Lactic Acid, Venous 1.2 0.5 - 1.9 mmol/L    Comment: Performed at [facility] [address] [place], [place] [address]   DG Pelvis Portable  Result Date: 02/24/2023 CLINICAL DATA:  Fall, pain post fall. EXAM: PORTABLE PELVIS 1-2 VIEWS COMPARISON:  None Available. FINDINGS: There is no evidence of pelvic fracture or diastasis. No pelvic bone lesions are seen. Multilevel degenerate disc disease of the lower lumbar spine. IMPRESSION: 1. No acute fracture or malalignment. 2. Multilevel degenerate disc disease of the lower lumbar spine. Electronically Signed   By: Keane Police D.O.   On: 02/24/2023 13:11   DG Clavicle Left  Result Date: 02/24/2023 CLINICAL DATA:  Left shoulder pain post fall EXAM: LEFT CLAVICLE - 2+ VIEWS COMPARISON:  None Available. FINDINGS: There is displaced fracture of the distal clavicle glenohumeral joint appear intact. Low lung volumes. Soft tissues are unremarkable. IMPRESSION: Displaced fracture of the distal clavicle. Electronically Signed   By: Keane Police D.O.   On: 02/24/2023 12:55   DG Chest Port 1 View  Result Date: 02/24/2023 CLINICAL DATA:  Pain after trauma EXAM: PORTABLE CHEST 1 VIEW COMPARISON:  07/01/2022 x-ray FINDINGS: Sternal wires. Underinflation. Borderline cardiopericardial silhouette with calcified tortuous aorta. Calcifications along the mitral valve annulus. No consolidation, pneumothorax, effusion or edema. Focal deformity of the distal aspect of the left clavicle not clearly seen previously. Prominent calcifications of the tracheobronchial tree. IMPRESSION: Postop chest.  Underinflation.  Borderline size heart. Deformity of the distal aspect of the left clavicle not clearly seen on the prior x-ray. Dedicated x-rays of the clavicle or shoulder  as clinically directed Electronically Signed   By: Jill Side M.D.   On: 02/24/2023 12:05   CT HEAD WO CONTRAST (5MM)  Result Date: 02/24/2023 CLINICAL DATA:  Trauma EXAM: CT HEAD WITHOUT CONTRAST TECHNIQUE: Contiguous axial images were obtained from the base of the skull through the vertex without intravenous contrast. RADIATION DOSE REDUCTION: This exam was performed according to the departmental dose-optimization program which includes automated exposure control, adjustment of the mA and/or kV according to patient size and/or use of iterative reconstruction technique. COMPARISON:  07/01/2022. FINDINGS: Brain: Petechial hemorrhage identified at the vertex on the right as well as in the right frontal lobe consistent with areas of contusion. No mass effect or shift. There is extra-axial fluid along the right frontotemporal region consistent with a tiny amount of subdural hematoma about 4 mm thick. There is right frontotemporal hyperdense subcutaneous collection consistent with cephalohematoma. No hydrocephalus. No shift. Vascular: No hyperdense vessel or unexpected calcification. Skull: Normal. Negative for fracture or focal lesion. Sinuses/Orbits: No acute finding. Other: None. IMPRESSION: 1. Involutional changes and chronic periventricular white-matter small-vessel ischemia. 2. Very small right frontotemporal subdural hematoma. No associated mass effect or shift. 3. Petechial hemorrhages consistent with contusion noted right frontal lobe and vertex. Findings were discussed with  and acknowledged by Dr. Sherry Ruffing. Electronically Signed   By: Sammie Bench M.D.   On: 02/24/2023 12:05   CT Cervical Spine Wo Contrast  Result Date: 02/24/2023 CLINICAL DATA:  Neck trauma EXAM: CT CERVICAL SPINE WITHOUT CONTRAST TECHNIQUE: Multidetector CT imaging of the cervical spine was performed without intravenous contrast. Multiplanar CT image reconstructions were also generated. RADIATION DOSE REDUCTION: This exam was  performed according to the departmental dose-optimization program which includes automated exposure control, adjustment of the mA and/or kV according to patient size and/or use of iterative reconstruction technique. COMPARISON:  07/01/2022 FINDINGS: Alignment: Accentuated cervical lordosis Skull base and vertebrae: No acute fracture. No primary bone lesion or focal pathologic process. T2 and T3 chronic superior endplate fractures. Soft tissues and spinal canal: No prevertebral fluid or swelling. No visible canal hematoma. Disc levels: Degenerative facet spurring asymmetric to the left at C2-3 and C3-4. Upper chest: No acute finding IMPRESSION: No evidence of acute intracranial or cervical spine injury. Electronically Signed   By: Jorje Guild M.D.   On: 02/24/2023 11:55    Pending Labs Unresulted Labs (From admission, onward)     Start     Ordered   02/25/23 0500  CBC  Tomorrow morning,   R        02/24/23 1329   02/25/23 0500  Comprehensive metabolic panel  Tomorrow morning,   R        02/24/23 1329            Vitals/Pain Today's Vitals   02/24/23 1212 02/24/23 1220 02/24/23 1332 02/24/23 1415  BP:   (!) 124/52 139/68  Pulse:   (!) 50 (!) 47  Resp:   (!) 21 19  Temp:      TempSrc:      SpO2:   97% 97%  Weight:  54 kg    Height:  5' (1.524 m)    PainSc: 3        Isolation Precautions No active isolations  Medications Medications  lactated ringers infusion (has no administration in time range)  traMADol (ULTRAM) tablet 50 mg (has no administration in time range)  acetaminophen (TYLENOL) tablet 650 mg (has no administration in time range)  morphine (PF) 2 MG/ML injection 1 mg (has no administration in time range)  morphine (PF) 2 MG/ML injection 2 mg (has no administration in time range)  methocarbamol (ROBAXIN) tablet 500 mg (has no administration in time range)    Or  methocarbamol (ROBAXIN) 500 mg in dextrose 5 % 50 mL IVPB (has no administration in time range)   melatonin tablet 3 mg (has no administration in time range)  docusate sodium (COLACE) capsule 100 mg (has no administration in time range)  ondansetron (ZOFRAN-ODT) disintegrating tablet 4 mg (has no administration in time range)    Or  ondansetron (ZOFRAN) injection 4 mg (has no administration in time range)  levETIRAcetam (KEPPRA) tablet 500 mg (has no administration in time range)  fentaNYL (SUBLIMAZE) injection 50 mcg (50 mcg Intravenous Given 02/24/23 1130)    Mobility walks with person assist     Focused Assessments    R Recommendations: See Admitting Provider Note  Report given to:   Additional Notes:

## 2023-02-25 ENCOUNTER — Inpatient Hospital Stay (HOSPITAL_COMMUNITY): Payer: Medicare HMO

## 2023-02-25 DIAGNOSIS — I359 Nonrheumatic aortic valve disorder, unspecified: Secondary | ICD-10-CM | POA: Diagnosis not present

## 2023-02-25 LAB — COMPREHENSIVE METABOLIC PANEL
ALT: 16 U/L (ref 0–44)
AST: 20 U/L (ref 15–41)
Albumin: 2.4 g/dL — ABNORMAL LOW (ref 3.5–5.0)
Alkaline Phosphatase: 46 U/L (ref 38–126)
Anion gap: 9 (ref 5–15)
BUN: 26 mg/dL — ABNORMAL HIGH (ref 8–23)
CO2: 27 mmol/L (ref 22–32)
Calcium: 8.7 mg/dL — ABNORMAL LOW (ref 8.9–10.3)
Chloride: 104 mmol/L (ref 98–111)
Creatinine, Ser: 1.75 mg/dL — ABNORMAL HIGH (ref 0.44–1.00)
GFR, Estimated: 29 mL/min — ABNORMAL LOW (ref >60)
Glucose, Bld: 102 mg/dL — ABNORMAL HIGH (ref 70–99)
Potassium: 3.5 mmol/L (ref 3.5–5.1)
Sodium: 140 mmol/L (ref 135–145)
Total Bilirubin: 0.5 mg/dL (ref 0.3–1.2)
Total Protein: 4.7 g/dL — ABNORMAL LOW (ref 6.5–8.1)

## 2023-02-25 LAB — PROTIME-INR
INR: 2.1 — ABNORMAL HIGH (ref 0.8–1.2)
Prothrombin Time: 23.5 s — ABNORMAL HIGH (ref 11.4–15.2)

## 2023-02-25 LAB — CBC
HCT: 26.5 % — ABNORMAL LOW (ref 36.0–46.0)
Hemoglobin: 8.8 g/dL — ABNORMAL LOW (ref 12.0–15.0)
MCH: 34 pg (ref 26.0–34.0)
MCHC: 33.2 g/dL (ref 30.0–36.0)
MCV: 102.3 fL — ABNORMAL HIGH (ref 80.0–100.0)
Platelets: 148 10*3/uL — ABNORMAL LOW (ref 150–400)
RBC: 2.59 MIL/uL — ABNORMAL LOW (ref 3.87–5.11)
RDW: 13.7 % (ref 11.5–15.5)
WBC: 5.1 10*3/uL (ref 4.0–10.5)
nRBC: 0 % (ref 0.0–0.2)

## 2023-02-25 MED ORDER — TAMSULOSIN HCL 0.4 MG PO CAPS
0.4000 mg | ORAL_CAPSULE | Freq: Every day | ORAL | Status: DC
  Administered 2023-02-25 – 2023-03-08 (×12): 0.4 mg via ORAL
  Filled 2023-02-25 (×12): qty 1

## 2023-02-25 MED ORDER — CARBIDOPA-LEVODOPA 25-250 MG PO TABS
2.0000 | ORAL_TABLET | Freq: Three times a day (TID) | ORAL | Status: DC
  Administered 2023-02-25 – 2023-02-27 (×7): 2 via ORAL
  Filled 2023-02-25 (×9): qty 2

## 2023-02-25 MED ORDER — ORAL CARE MOUTH RINSE
15.0000 mL | OROMUCOSAL | Status: DC | PRN
  Administered 2023-02-26: 15 mL via OROMUCOSAL

## 2023-02-25 MED ORDER — CHLORHEXIDINE GLUCONATE CLOTH 2 % EX PADS
6.0000 | MEDICATED_PAD | Freq: Every day | CUTANEOUS | Status: DC
  Administered 2023-02-25 – 2023-03-05 (×7): 6 via TOPICAL

## 2023-02-25 MED ORDER — SIMVASTATIN 20 MG PO TABS
20.0000 mg | ORAL_TABLET | Freq: Every day | ORAL | Status: DC
  Administered 2023-02-25 – 2023-03-07 (×11): 20 mg via ORAL
  Filled 2023-02-25 (×11): qty 1

## 2023-02-25 MED ORDER — WARFARIN - PHARMACIST DOSING INPATIENT
Freq: Every day | Status: DC
  Administered 2023-03-04: 1

## 2023-02-25 MED ORDER — WARFARIN SODIUM 5 MG PO TABS
5.0000 mg | ORAL_TABLET | Freq: Once | ORAL | Status: AC
  Administered 2023-02-25: 5 mg via ORAL
  Filled 2023-02-25 (×2): qty 1

## 2023-02-25 NOTE — Evaluation (Signed)
Occupational Therapy Evaluation Patient Details Name: [name] MRN: [id number] DOB: 04/04/1941 Today's Date: 02/25/2023   History of Present Illness The pt is an 82 yo female presenting 2/29 after falling while getting out of her car. Imaging showed TBI/R SDH/contusion of R frontal lobe, and L clavicle fx. PMH includes: supranuclear palsy, BPV, HTN, GERD, CKD II, afib, HLD, mechanical aortic valve replacement on Coumadin therapy, and previous breast cancer.   Clinical Impression   Pt is typically min A for mobility with rollator and husband being right beside her all the time. He helps her shower in standing using grab bar and she gets some assist with dressing - but does her own grooming and self feeding. Today she presents with decreased cognition (family says it is close to baseline) but decreased safety awareness with new L Clavical and requiring assist for Bil UE tasks. Currently mod A for UB ADL and mod A for LB ADL. Educated Pt and husband on sling don/doff/positioning. Min A +2 for transfers currently and max A in standing for peri care. Husband present and very active caregiver interested in education on how best to support wife with new changes.  Recommending HHOT post-acute to maximize safety and independence in ADL and functional transfers.     Recommendations for follow up therapy are one component of a multi-disciplinary discharge planning process, led by the attending physician.  Recommendations may be updated based on patient status, additional functional criteria and insurance authorization.   Follow Up Recommendations  Home health OT     Assistance Recommended at Discharge Frequent or constant Supervision/Assistance  Patient can return home with the following A lot of help with walking and/or transfers;A lot of help with bathing/dressing/bathroom;Assistance with cooking/housework;Direct supervision/assist for medications management;Direct supervision/assist for  financial management;Assist for transportation;Help with stairs or ramp for entrance    Functional Status Assessment  Patient has had a recent decline in their functional status and demonstrates the ability to make significant improvements in function in a reasonable and predictable amount of time.  Equipment Recommendations  Tub/shower seat    Recommendations for Other Services PT consult     Precautions / Restrictions Precautions Precautions: Fall Precaution Comments: pt is a high fall risk Restrictions Weight Bearing Restrictions: Yes LUE Weight Bearing: Non weight bearing Other Position/Activity Restrictions: NWB in sling      Mobility Bed Mobility Overal bed mobility: Needs Assistance Bed Mobility: Supine to Sit, Sit to Supine     Supine to sit: Min assist Sit to supine: Mod assist   General bed mobility comments: minA to manage trunk without use of LUE, modA to return to bed with NWB    Transfers Overall transfer level: Needs assistance Equipment used: 2 person hand held assist Transfers: Sit to/from Stand, Bed to chair/wheelchair/BSC Sit to Stand: Min assist, +2 physical assistance     Step pivot transfers: Min assist     General transfer comment: minA to rise to standing, cues for continued NWB of LUE, min-modA to manage steps to and from [facility]. posterior lean      Balance Overall balance assessment: Needs assistance Sitting-balance support: No upper extremity supported, Feet supported Sitting balance-Leahy Scale: Fair   Postural control: Posterior lean Standing balance support: Single extremity supported Standing balance-Leahy Scale: Poor                             ADL either performed or assessed with clinical judgement  ADL Overall ADL's : Needs assistance/impaired Eating/Feeding: NPO   Grooming: Min guard;Wash/dry face;Sitting   Upper Body Bathing: Moderate assistance;With caregiver independent assisting;Standing   Lower Body  Bathing: Maximal assistance;With caregiver independent assisting Lower Body Bathing Details (indicate cue type and reason): Pt stands in the shower and holds onto the grab bar and her husband bathes her in standing Upper Body Dressing : Moderate assistance;Sitting Upper Body Dressing Details (indicate cue type and reason): extra gown Lower Body Dressing: Min guard;Sitting/lateral leans Lower Body Dressing Details (indicate cue type and reason): able to don socks long sitting in the bed Toilet Transfer: Minimal assistance;+2 for physical assistance;+2 for safety/equipment;Stand-pivot;BSC/3in1   Toileting- Clothing Manipulation and Hygiene: Maximal assistance;+2 for safety/equipment;Sit to/from stand       Functional mobility during ADLs: Minimal assistance;+2 for safety/equipment;+2 for physical assistance (hand held assist) General ADL Comments: slow to respond, decreased balance, continue to monitor vision, LLE in sling now and unable to perform Bil hand tasks, husband is very good caretaker.     Vision Baseline Vision/History: 1 Wears glasses Ability to See in Adequate Light: 0 Adequate Patient Visual Report: No change from baseline Vision Assessment?: No apparent visual deficits Additional Comments: supranuclear palsy is frequently associated with vision problems. Pt reports double vision that comes and goes. not currently present.will continue to monitor     Perception     Praxis      Pertinent Vitals/Pain Pain Assessment Pain Assessment: No/denies pain Pain Score: 0-No pain Faces Pain Scale: No hurt Pain Intervention(s): Monitored during session, Repositioned     Hand Dominance Right   Extremity/Trunk Assessment Upper Extremity Assessment Upper Extremity Assessment: LUE deficits/detail LUE Deficits / Details: clavicle fx, no pain and attempting to use functionally, sling aarived at end of session and donned LUE Sensation: WNL LUE Coordination: decreased gross motor    Lower Extremity Assessment Lower Extremity Assessment: Defer to PT evaluation   Cervical / Trunk Assessment Cervical / Trunk Assessment: Kyphotic   Communication Communication Communication: No difficulties   Cognition Arousal/Alertness: Awake/alert Behavior During Therapy: Flat affect Overall Cognitive Status: Impaired/Different from baseline Area of Impairment: Safety/judgement, Awareness, Problem solving                         Safety/Judgement: Decreased awareness of deficits Awareness: Intellectual Problem Solving: Decreased initiation, Requires verbal cues General Comments: pt with flat affect, slow to respond at times but following cues well in session. able to state date appropriately, but daughter reports telling pt date earlier in morning     General Comments  VSS on RA    Exercises     Shoulder Instructions      Home Living Family/patient expects to be discharged to:: Private residence Living Arrangements: Spouse/significant other Available Help at Discharge: Family;Available 24 hours/day Type of Home: House Home Access: Stairs to enter [employer] of Steps: 1 Entrance Stairs-Rails: None Home Layout: Two level;Able to live on main level with bedroom/bathroom Alternate Level Stairs-Number of Steps: flight Alternate Level Stairs-Rails: Right;Left Bathroom Shower/Tub: Occupational psychologist: Handicapped height Bathroom Accessibility: Yes   Home Equipment: Rollator (4 wheels);Grab bars - toilet;Grab bars - tub/shower;Wheelchair - Media planner Comments: will furniture walk or walk with husband, uses helmet for mobility at home  Lives With: Spouse    Prior Functioning/Environment Prior Level of Function : Needs assist             Mobility Comments: hands on assist  with rollator for walking ADLs Comments: husband assists with bathing and dressing        OT Problem List: Decreased activity  tolerance;Decreased range of motion;Impaired balance (sitting and/or standing);Decreased cognition;Decreased safety awareness;Decreased knowledge of use of DME or AE;Impaired UE functional use      OT Treatment/Interventions: Self-care/ADL training;Therapeutic exercise;DME and/or AE instruction;Therapeutic activities;Patient/family education;Balance training    OT Goals(Current goals can be found in the care plan section) Acute Rehab OT Goals Patient Stated Goal: be safe at home and make necessary changes to take care of wife OT Goal Formulation: With family Time For Goal Achievement: 03/11/23 Potential to Achieve Goals: Good ADL Goals Pt Will Perform Grooming: with set-up;sitting;with caregiver independent in assisting Pt Will Perform Upper Body Dressing: with min assist;with caregiver independent in assisting;sitting Pt Will Perform Lower Body Dressing: with mod assist;with caregiver independent in assisting;sit to/from stand Pt Will Transfer to Toilet: with min assist;ambulating Pt Will Perform Toileting - Clothing Manipulation and hygiene: with mod assist;sit to/from stand;with caregiver independent in assisting Pt Will Perform Tub/Shower Transfer: with mod assist;ambulating;grab bars;shower seat;with caregiver independent in assisting Additional ADL Goal #1: caregiver will demonstrate ability to assist with bed mobility maintaining NWB through LUE prior to engaging in ADL  OT Frequency: Min 2X/week    Co-evaluation   Reason for Co-Treatment: Complexity of the patient's impairments (multi-system involvement);Necessary to address cognition/behavior during functional activity;For patient/therapist safety;To address functional/ADL transfers PT goals addressed during session: Mobility/safety with mobility;Balance;Strengthening/ROM        AM-PAC OT "6 Clicks" Daily Activity     Outcome Measure Help from another person eating meals?: A Little Help from another person taking care of  personal grooming?: A Little Help from another person toileting, which includes using toliet, bedpan, or urinal?: A Lot Help from another person bathing (including washing, rinsing, drying)?: A Lot Help from another person to put on and taking off regular upper body clothing?: A Lot Help from another person to put on and taking off regular lower body clothing?: A Lot 6 Click Score: 14   End of Session Equipment Utilized During Treatment: Gait belt Nurse Communication: Mobility status  Activity Tolerance: Patient tolerated treatment well Patient left: in bed;with nursing/sitter in room (getting a foley cath - required bed level)  OT Visit Diagnosis: Unsteadiness on feet (R26.81);Other abnormalities of gait and mobility (R26.89);History of falling (Z91.81);Muscle weakness (generalized) (M62.81);Other symptoms and signs involving the nervous system (R29.898);Other symptoms and signs involving cognitive function                Time: [id number] OT Time Calculation (min): 37 min Charges:  OT General Charges $OT Visit: 1 Visit OT Evaluation $OT Eval Moderate Complexity: [place] OTR/L Acute Rehabilitation Services Office: [place] 02/25/2023, 1:35 PM

## 2023-02-25 NOTE — Progress Notes (Signed)
Rounding Note    Patient Name: [name] Date of Encounter: 02/25/2023  [place] Cardiologist: Peter Martinique, MD   Subjective   Patient sleeping   Daughter says she had a good night  Slept well   Ate well at dinner  CT with interval minimal decrease in hematoma  Inpatient Medications    Scheduled Meds:  acetaminophen  650 mg Oral Q6H   Chlorhexidine Gluconate Cloth  6 each Topical Daily   docusate sodium  100 mg Oral BID   levETIRAcetam  500 mg Oral BID   Continuous Infusions:  lactated ringers 100 mL/hr at 02/25/23 [id number]   methocarbamol (ROBAXIN) IV     PRN Meds: melatonin, methocarbamol **OR** methocarbamol (ROBAXIN) IV, morphine injection, morphine injection, ondansetron **OR** ondansetron (ZOFRAN) IV, traMADol   Vital Signs    Vitals:   02/25/23 0300 02/25/23 0400 02/25/23 0500 02/25/23 0600  BP: 119/72 (!) 111/54 (!) 98/49 (!) 118/51  Pulse: 65 75 (!) 43 (!) 41  Resp: '18 19 18 14  '$ Temp:  98.2 F (36.8 C)    TempSrc:  Axillary    SpO2: 94% 90% 94% 95%  Weight:      Height:        Intake/Output Summary (Last 24 hours) at 02/25/2023 0658 Last data filed at 02/25/2023 0600 Gross per 24 hour  Intake 1300 ml  Output 825 ml  Net 475 ml      02/24/2023   12:20 PM 01/26/2023    8:07 AM 09/21/2022    8:23 AM  Last 3 Weights  Weight (lbs) 119 lb 0.8 oz 117 lb 9.6 oz 113 lb  Weight (kg) 54 kg 53.343 kg 51.256 kg      Telemetry    SR and ectopic atrial rhythm (80s) - Personally Reviewed  ECG    No new  - Personally Reviewed  Physical Exam   GEN: Petitie elderly 82 yo in NAD  SLeeping  Neck: No JVD Cardiac: RRR, Crisp valve sounds  Respiratory: Clear to auscultation bilaterally. Anteriorly  GI: Soft, non-distended  MS: No edema; No deformity.   Labs    High Sensitivity Troponin:  No results for input(s): "TROPONINIHS" in the last 720 hours.   Chemistry Recent Labs  Lab 02/24/23 1105 02/24/23 1115  NA 137 137  K 3.9 4.0   CL 98 99  CO2 28  --   GLUCOSE 111* 105*  BUN 33* 38*  CREATININE 2.02* 2.40*  CALCIUM 10.1  --   PROT 6.5  --   ALBUMIN 3.5  --   AST 30  --   ALT 15  --   ALKPHOS 75  --   BILITOT 0.4  --   GFRNONAA 24*  --   ANIONGAP 11  --     Lipids No results for input(s): "CHOL", "TRIG", "HDL", "LABVLDL", "LDLCALC", "CHOLHDL" in the last 168 hours.  Hematology Recent Labs  Lab 02/24/23 1105 02/24/23 1115  WBC 7.6  --   RBC 3.37*  --   HGB 11.2* 11.6*  HCT 35.3* 34.0*  MCV 104.7*  --   MCH 33.2  --   MCHC 31.7  --   RDW 13.6  --   PLT 202  --    Thyroid No results for input(s): "TSH", "FREET4" in the last 168 hours.  BNPNo results for input(s): "BNP", "PROBNP" in the last 168 hours.  DDimer No results for input(s): "DDIMER" in the last 168 hours.   Radiology  CT HEAD WO CONTRAST (5MM)  Result Date: 02/25/2023 CLINICAL DATA:  Follow-up examination for subdural hematoma. EXAM: CT HEAD WITHOUT CONTRAST TECHNIQUE: Contiguous axial images were obtained from the base of the skull through the vertex without intravenous contrast. RADIATION DOSE REDUCTION: This exam was performed according to the departmental dose-optimization program which includes automated exposure control, adjustment of the mA and/or kV according to patient size and/or use of iterative reconstruction technique. COMPARISON:  Prior CT from 02/24/2023. FINDINGS: Brain: Previously identified trace subdural hemorrhage overlying the right frontotemporal convexity again seen, slightly decreased in size now measuring up to 2 mm in thickness. No significant mass effect. Few subcentimeter hyperdense foci involving the anterior parasagittal right frontal lobe, consistent with small contusions (series 3, images 23, 18). Largest of these foci measures 5 mm. These are stable. No other new acute intracranial hemorrhage. No acute large vessel territory infarct. Underlying atrophy with chronic small vessel ischemic disease again noted. No  mass lesion or midline shift. No hydrocephalus. Vascular: No abnormal hyperdense vessel. Calcified atherosclerosis present at skull base. Skull: Soft tissue contusion again noted at the right frontal scalp. Calvarium remains intact. Sinuses/Orbits: Globes orbital soft tissues demonstrate no acute finding. Mild scattered mucoperiosteal thickening present about the ethmoidal air cells and left maxillary sinus. No mastoid effusion. Other: None. IMPRESSION: 1. Slight interval decrease in size of trace subdural hemorrhage overlying the right frontotemporal convexity, now measuring up to 2 mm in thickness. No significant mass effect. 2. Few subcentimeter cortical contusions involving the anterior parasagittal right frontal lobe, stable. 3. Soft tissue contusion at the right frontal scalp. No calvarial fracture. 4. No other new acute intracranial abnormality. Electronically Signed   By: Jeannine Boga M.D.   On: 02/25/2023 00:32   CT CHEST ABDOMEN PELVIS WO CONTRAST  Result Date: 02/24/2023 CLINICAL DATA:  Fall.  Left chest wall pain.  On Coumadin. EXAM: CT CHEST, ABDOMEN AND PELVIS WITHOUT CONTRAST TECHNIQUE: Multidetector CT imaging of the chest, abdomen and pelvis was performed following the standard protocol without IV contrast. RADIATION DOSE REDUCTION: This exam was performed according to the departmental dose-optimization program which includes automated exposure control, adjustment of the mA and/or kV according to patient size and/or use of iterative reconstruction technique. COMPARISON:  02/24/2023 chest radiograph. 02/05/2022 abdominopelvic CT. Most recent chest CT of 12/05/2018 FINDINGS: CT CHEST FINDINGS Cardiovascular: Multifactorial degradation, including arm position and lack of IV contrast. Overlying wires and leads. Aortic atherosclerosis. Tortuous thoracic aorta. Moderate cardiomegaly. Aortic valve repair. Multivessel coronary artery atherosclerosis. Median sternotomy. Mediastinum/Nodes: No  mediastinal or hilar adenopathy, given limitations of unenhanced CT. Large hiatal hernia, with approximately 1/2 of the stomach positioned in the lower chest. Lungs/Pleura: Trace left-sided pleural fluid or thickening. No pneumothorax.  Volume loss in the left lower lobe. 5 mm left lower lobe pulmonary nodule on 107/4 is similar over multiple prior exams, can be presumed benign and do/does not warrant imaging follow-up per Fleischner criteria. No pulmonary contusion or pneumothorax. Subpleural and basilar predominant interstitial thickening may represent mild interstitial lung disease. Musculoskeletal: Left breast/chest wall heterogeneous soft tissue density and calcifications including on 36/3 are progressive since but present on 12/05/2018. Left distal clavicle irregularity on 06/03 is new since 2019. Remote posterior lower right rib fractures. Concavity involving T2 and T3 superior endplates, similar to 2019. CT ABDOMEN PELVIS FINDINGS Hepatobiliary: Hepatic hypoattenuation. Tiny hepatic cysts. Normal gallbladder, without biliary ductal dilatation. Pancreas: 1.7 cm pancreatic uncinate process lesion on 64/3 is suboptimally evaluated but grossly similar to on the  prior. No duct dilatation or acute inflammation. Spleen: Normal in size, without focal abnormality. Adrenals/Urinary Tract: Normal right adrenal gland. Mild left adrenal thickening. Mild renal cortical thinning bilaterally. Punctate right renal collecting system calculi. Moderate left-sided hydronephrosis with large left renal collecting system stone burden. A dominant stone at the left ureteropelvic junction measures 1.2 cm and is relatively similar. No bladder calculi. Distal right ureteric stone measures 6 mm. Minimal distal right hydroureter. Stomach/Bowel: Normal remainder of the stomach. 6 cm stool ball within the rectum. Extensive colonic diverticulosis. Normal terminal ileum. Suspected tiny appendix with appendicolith within. No surrounding  inflammation. Normal small bowel. Vascular/Lymphatic: Aortic atherosclerosis. Multiple mesenteric calcifications and small nodes. A partially calcified jejunal mesenteric node measures 1.4 x 1.7 cm on 71/3 and is increased from 7 mm on 50/3 of the 02/05/2022 exam. No pelvic sidewall adenopathy. Reproductive: Hysterectomy.  No adnexal mass. Other: No significant free fluid.  No free intraperitoneal air. Musculoskeletal: Mild osteopenia. Grade 1 L4-5 anterolisthesis with advanced lumbosacral spondylosis. Moderate lumbar spine curvature, convex left. IMPRESSION: 1. Multifactorial degradation as detailed above. 2. Distal left clavicular deformity is new since 2019 but favored to be nonacute. Correlate with point tenderness. 3. Otherwise no acute posttraumatic deformity identified. 4. Moderate left-sided hydronephrosis secondary to dominant stone at the left ureteropelvic junction. 5. Mild right distal hydroureter secondary to a distal right ureteric 6 mm stone. 6. Large hiatal hernia 7. Similar abnormal appearance of the anterior left chest wall since 02/05/2022. Not felt to be posttraumatic. 8. Extensive mesenteric calcifications with a calcified dominant nodule or node which has enlarged over prior exams. Although this could simply represent the sequelae of old granulomatous disease, metastatic carcinoid could also have this appearance. Correlate with patient's symptoms. Consider nonemergent outpatient Dotatate PET. 9.  Possible constipation or even fecal impaction. 10. Coronary artery atherosclerosis. Aortic Atherosclerosis (ICD10-I70.0). 11. Hepatic hypoattenuation is nonspecific but can be seen with amiodarone usage. 12. Ongoing stability of pancreatic uncinate process 1.7 cm cystic lesion which can be presumed benign and does not warrant follow-up imaging. Electronically Signed   By: Abigail Miyamoto M.D.   On: 02/24/2023 15:20   DG Cerv Spine Flex&Ext Only  Result Date: 02/24/2023 CLINICAL DATA:  Neck pain.   Trauma EXAM: CERVICAL SPINE - FLEXION AND EXTENSION VIEWS ONLY COMPARISON:  Standard cervical x-ray 05/17/2016. Cervical spine CT 02/24/2023 FINDINGS: Limited study as only flexion and extension views were obtained. C1 through C7 is present on the two views. Preserved vertebral body heights and prevertebral soft tissues. Moderate disc height loss at C5-6 and mild C6-7. Associated endplate osteophytes. Hypertrophic changes at the dens C1 interval. No significant subluxation on these images. IMPRESSION: Degenerative changes. No clear simple fixation. Please correlate with findings at separate cervical spine CT from same date Electronically Signed   By: Jill Side M.D.   On: 02/24/2023 14:46   DG Pelvis Portable  Result Date: 02/24/2023 CLINICAL DATA:  Fall, pain post fall. EXAM: PORTABLE PELVIS 1-2 VIEWS COMPARISON:  None Available. FINDINGS: There is no evidence of pelvic fracture or diastasis. No pelvic bone lesions are seen. Multilevel degenerate disc disease of the lower lumbar spine. IMPRESSION: 1. No acute fracture or malalignment. 2. Multilevel degenerate disc disease of the lower lumbar spine. Electronically Signed   By: Keane Police D.O.   On: 02/24/2023 13:11   DG Clavicle Left  Result Date: 02/24/2023 CLINICAL DATA:  Left shoulder pain post fall EXAM: LEFT CLAVICLE - 2+ VIEWS COMPARISON:  None Available. FINDINGS: There is  displaced fracture of the distal clavicle glenohumeral joint appear intact. Low lung volumes. Soft tissues are unremarkable. IMPRESSION: Displaced fracture of the distal clavicle. Electronically Signed   By: Keane Police D.O.   On: 02/24/2023 12:55   DG Chest Port 1 View  Result Date: 02/24/2023 CLINICAL DATA:  Pain after trauma EXAM: PORTABLE CHEST 1 VIEW COMPARISON:  07/01/2022 x-ray FINDINGS: Sternal wires. Underinflation. Borderline cardiopericardial silhouette with calcified tortuous aorta. Calcifications along the mitral valve annulus. No consolidation, pneumothorax,  effusion or edema. Focal deformity of the distal aspect of the left clavicle not clearly seen previously. Prominent calcifications of the tracheobronchial tree. IMPRESSION: Postop chest.  Underinflation.  Borderline size heart. Deformity of the distal aspect of the left clavicle not clearly seen on the prior x-ray. Dedicated x-rays of the clavicle or shoulder as clinically directed Electronically Signed   By: Jill Side M.D.   On: 02/24/2023 12:05   CT HEAD WO CONTRAST (5MM)  Result Date: 02/24/2023 CLINICAL DATA:  Trauma EXAM: CT HEAD WITHOUT CONTRAST TECHNIQUE: Contiguous axial images were obtained from the base of the skull through the vertex without intravenous contrast. RADIATION DOSE REDUCTION: This exam was performed according to the departmental dose-optimization program which includes automated exposure control, adjustment of the mA and/or kV according to patient size and/or use of iterative reconstruction technique. COMPARISON:  07/01/2022. FINDINGS: Brain: Petechial hemorrhage identified at the vertex on the right as well as in the right frontal lobe consistent with areas of contusion. No mass effect or shift. There is extra-axial fluid along the right frontotemporal region consistent with a tiny amount of subdural hematoma about 4 mm thick. There is right frontotemporal hyperdense subcutaneous collection consistent with cephalohematoma. No hydrocephalus. No shift. Vascular: No hyperdense vessel or unexpected calcification. Skull: Normal. Negative for fracture or focal lesion. Sinuses/Orbits: No acute finding. Other: None. IMPRESSION: 1. Involutional changes and chronic periventricular white-matter small-vessel ischemia. 2. Very small right frontotemporal subdural hematoma. No associated mass effect or shift. 3. Petechial hemorrhages consistent with contusion noted right frontal lobe and vertex. Findings were discussed with and acknowledged by Dr. Sherry Ruffing. Electronically Signed   By: Sammie Bench M.D.   On: 02/24/2023 12:05   CT Cervical Spine Wo Contrast  Result Date: 02/24/2023 CLINICAL DATA:  Neck trauma EXAM: CT CERVICAL SPINE WITHOUT CONTRAST TECHNIQUE: Multidetector CT imaging of the cervical spine was performed without intravenous contrast. Multiplanar CT image reconstructions were also generated. RADIATION DOSE REDUCTION: This exam was performed according to the departmental dose-optimization program which includes automated exposure control, adjustment of the mA and/or kV according to patient size and/or use of iterative reconstruction technique. COMPARISON:  07/01/2022 FINDINGS: Alignment: Accentuated cervical lordosis Skull base and vertebrae: No acute fracture. No primary bone lesion or focal pathologic process. T2 and T3 chronic superior endplate fractures. Soft tissues and spinal canal: No prevertebral fluid or swelling. No visible canal hematoma. Disc levels: Degenerative facet spurring asymmetric to the left at C2-3 and C3-4. Upper chest: No acute finding IMPRESSION: No evidence of acute intracranial or cervical spine injury. Electronically Signed   By: Jorje Guild M.D.   On: 02/24/2023 11:55     Patient Profile      Caryl Sidell is a 82 y.o. female with a hx of hypertension, atrial fibrillation, PVCs, sinus bradycardia, chronic combined systolic and diastolic CHF, CKD stage III, status post mechanical Saint Jude aortic valve who is being seen 02/24/2023 for the evaluation of anticoagulation at the request of trauma MD.  Assessment & Plan    1  AV disesae  Pt s/p St Jude mechanical AVR   Continue coumadin  for AVR    Pharmacy is following     I will make sure clinic is notified that she will have close follow up after d/c   2  Rhythm  Patient has SR and an ectopic atrial rhythm on tele  Rates of ectopic are 55s    SR 60s      Pt tolerating   3  Hx HTN  BP overall OK    4  Hx HFrEF  Echo in 2021 LVEF 30 to 35%     I would recomm changing fluids to  IV heplock since taking PO well   She was on lasix 20 mg at home  I would resume prior to discharge   5  CKD   Cr 1.75  ws 1.8 in July 2023, 2.0 in August 2023    6  HL   Had been on simvistatin   I would resume    I will sign off  Please call with questions.    I will make sure she has appt in cardiology and also that coumadin check in IM is notified.  For questions or updates, please contact [place] Please consult [email or web address] for contact info under        Signed, Dorris Carnes, MD  02/25/2023, 6:58 AM

## 2023-02-25 NOTE — Progress Notes (Signed)
Patient ID: [name], female   DOB: May 07, 1941, 82 y.o.   MRN: [id number] Follow up - Trauma Critical Care   Patient Details:    [name] is an 82 y.o. female.  Lines/tubes : External Urinary Catheter (Active)  Collection Container Dedicated Suction Canister 02/24/23 2000  Suction (Verified suction is between 40-80 mmHg) Yes 02/24/23 2000  Securement Method None needed 02/24/23 2000  Site Assessment Clean, Dry, Intact 02/24/23 2000    Microbiology/Sepsis markers: Results for orders placed or performed during the hospital encounter of 02/05/22  Resp Panel by RT-PCR (Flu A&B, Covid) Nasopharyngeal Swab     Status: None   Collection Time: 02/05/22  9:45 AM   Specimen: Nasopharyngeal Swab; Nasopharyngeal(NP) swabs in vial transport medium  Result Value Ref Range Status   SARS Coronavirus 2 by RT PCR NEGATIVE NEGATIVE Final    Comment: (NOTE) SARS-CoV-2 target nucleic acids are NOT DETECTED.  The SARS-CoV-2 RNA is generally detectable in upper respiratory specimens during the acute phase of infection. The lowest concentration of SARS-CoV-2 viral copies this assay can detect is 138 copies/mL. A negative result does not preclude SARS-Cov-2 infection and should not be used as the sole basis for treatment or other patient management decisions. A negative result may occur with  improper specimen collection/handling, submission of specimen other than nasopharyngeal swab, presence of viral mutation(s) within the areas targeted by this assay, and inadequate number of viral copies(<138 copies/mL). A negative result must be combined with clinical observations, patient history, and epidemiological information. The expected result is Negative.  Fact Sheet for Patients:  EntrepreneurPulse.com.au  Fact Sheet for Healthcare Providers:  IncredibleEmployment.be  This test is no t yet approved or cleared by the [place] FDA and   has been authorized for detection and/or diagnosis of SARS-CoV-2 by FDA under an Emergency Use Authorization (EUA). This EUA will remain  in effect (meaning this test can be used) for the duration of the COVID-19 declaration under Section 564(b)(1) of the Act, 21 U.S.C.section 360bbb-3(b)(1), unless the authorization is terminated  or revoked sooner.       Influenza A by PCR NEGATIVE NEGATIVE Final   Influenza B by PCR NEGATIVE NEGATIVE Final    Comment: (NOTE) The Xpert Xpress SARS-CoV-2/FLU/RSV plus assay is intended as an aid in the diagnosis of influenza from Nasopharyngeal swab specimens and should not be used as a sole basis for treatment. Nasal washings and aspirates are unacceptable for Xpert Xpress SARS-CoV-2/FLU/RSV testing.  Fact Sheet for Patients: EntrepreneurPulse.com.au  Fact Sheet for Healthcare Providers: IncredibleEmployment.be  This test is not yet approved or cleared by the [place] FDA and has been authorized for detection and/or diagnosis of SARS-CoV-2 by FDA under an Emergency Use Authorization (EUA). This EUA will remain in effect (meaning this test can be used) for the duration of the COVID-19 declaration under Section 564(b)(1) of the Act, 21 U.S.C. section 360bbb-3(b)(1), unless the authorization is terminated or revoked.  Performed at [facility], [place] [address], [place], [place] [address]   Urine Culture     Status: Abnormal   Collection Time: 02/05/22  3:11 PM   Specimen: Urine, Clean Catch  Result Value Ref Range Status   Specimen Description URINE, CLEAN CATCH  Final   Special Requests   Final    NONE Performed at [facility], [place] [address] [place], [place] [address]    Culture MULTIPLE SPECIES PRESENT, SUGGEST RECOLLECTION (A)  Final   Report Status 02/07/2022  FINAL  Final    Anti-infectives:  Anti-infectives (From admission, onward)    None     Consults: Treatment Team:   Kristeen Miss, MD Altamese Frankfort, MD    Studies:    Events:  Subjective:    Overnight Issues:  stable Objective:  Vital signs for last 24 hours: Temp:  [97.9 F (36.6 C)-98.3 F (36.8 C)] 98.2 F (36.8 C) (03/01 0400) Pulse Rate:  [41-103] 44 (03/01 0700) Resp:  [14-31] 17 (03/01 0700) BP: (98-166)/(49-110) 148/54 (03/01 0700) SpO2:  [87 %-100 %] 100 % (03/01 0700) Weight:  [54 kg] 54 kg (02/29 1220)  Hemodynamic parameters for last 24 hours:    Intake/Output from previous day: 02/29 0701 - 03/01 0700 In: 1300 [I.V.:1300] Out: 825 [Urine:825]  Intake/Output this shift: No intake/output data recorded.  Vent settings for last 24 hours:    Physical Exam:  General: no respiratory distress Neuro: arouses and F/C, MAE HEENT/Neck: no JVD Resp: clear to auscultation bilaterally CVS: RRR brady GI: soft, NT Extremities: calves soft  Results for orders placed or performed during the hospital encounter of 02/24/23 (from the past 24 hour(s))  Comprehensive metabolic panel     Status: Abnormal   Collection Time: 02/24/23 11:05 AM  Result Value Ref Range   Sodium 137 135 - 145 mmol/L   Potassium 3.9 3.5 - 5.1 mmol/L   Chloride 98 98 - 111 mmol/L   CO2 28 22 - 32 mmol/L   Glucose, Bld 111 (H) 70 - 99 mg/dL   BUN 33 (H) 8 - 23 mg/dL   Creatinine, Ser 2.02 (H) 0.44 - 1.00 mg/dL   Calcium 10.1 8.9 - 10.3 mg/dL   Total Protein 6.5 6.5 - 8.1 g/dL   Albumin 3.5 3.5 - 5.0 g/dL   AST 30 15 - 41 U/L   ALT 15 0 - 44 U/L   Alkaline Phosphatase 75 38 - 126 U/L   Total Bilirubin 0.4 0.3 - 1.2 mg/dL   GFR, Estimated 24 (L) >60 mL/min   Anion gap 11 5 - 15  CBC     Status: Abnormal   Collection Time: 02/24/23 11:05 AM  Result Value Ref Range   WBC 7.6 4.0 - 10.5 K/uL   RBC 3.37 (L) 3.87 - 5.11 MIL/uL   Hemoglobin 11.2 (L) 12.0 - 15.0 g/dL   HCT 35.3 (L) 36.0 - 46.0 %   MCV 104.7 (H) 80.0 - 100.0 fL   MCH 33.2 26.0 - 34.0 pg   MCHC 31.7 30.0 - 36.0 g/dL   RDW 13.6 11.5  - 15.5 %   Platelets 202 150 - 400 K/uL   nRBC 0.0 0.0 - 0.2 %  Protime-INR     Status: Abnormal   Collection Time: 02/24/23 11:05 AM  Result Value Ref Range   Prothrombin Time 26.0 (H) 11.4 - 15.2 seconds   INR 2.4 (H) 0.8 - 1.2  Type and screen [place]     Status: None   Collection Time: 02/24/23 11:05 AM  Result Value Ref Range   ABO/RH(D) O POS    Antibody Screen NEG    Sample Expiration      02/27/2023,2359 Performed at [facility], [address], [place],  [address]   I-stat chem 8, ed     Status: Abnormal   Collection Time: 02/24/23 11:15 AM  Result Value Ref Range   Sodium 137 135 - 145 mmol/L   Potassium 4.0 3.5 - 5.1 mmol/L  Chloride 99 98 - 111 mmol/L   BUN 38 (H) 8 - 23 mg/dL   Creatinine, Ser 2.40 (H) 0.44 - 1.00 mg/dL   Glucose, Bld 105 (H) 70 - 99 mg/dL   Calcium, Ion 1.26 1.15 - 1.40 mmol/L   TCO2 32 22 - 32 mmol/L   Hemoglobin 11.6 (L) 12.0 - 15.0 g/dL   HCT 34.0 (L) 36.0 - 46.0 %  Ethanol     Status: None   Collection Time: 02/24/23 11:21 AM  Result Value Ref Range   Alcohol, Ethyl (B) <10 <10 mg/dL  Lactic acid, plasma     Status: None   Collection Time: 02/24/23 11:21 AM  Result Value Ref Range   Lactic Acid, Venous 1.2 0.5 - 1.9 mmol/L  CBC     Status: Abnormal   Collection Time: 02/25/23  6:50 AM  Result Value Ref Range   WBC 5.1 4.0 - 10.5 K/uL   RBC 2.59 (L) 3.87 - 5.11 MIL/uL   Hemoglobin 8.8 (L) 12.0 - 15.0 g/dL   HCT 26.5 (L) 36.0 - 46.0 %   MCV 102.3 (H) 80.0 - 100.0 fL   MCH 34.0 26.0 - 34.0 pg   MCHC 33.2 30.0 - 36.0 g/dL   RDW 13.7 11.5 - 15.5 %   Platelets 148 (L) 150 - 400 K/uL   nRBC 0.0 0.0 - 0.2 %    Assessment & Plan: Present on Admission:  Fall    LOS: 1 day   Additional comments:I reviewed the patient's new clinical lab test results. And CT head Fall on coumadin TBI/R SDH/Contusion of the R frontal lobe + vertex - NSGY consult. Dr. Ellene Route. No acute intervention. No need to reverse  anticoagulation.  He is ok with what cardiology needs to do in regards to her anticoagulation.Repeat CTH stable. Keppra for seizure ppx. I D/W Dr. Ellene Route - OK to continue coumadin. ICU for frequent neuro checks. TBI team therapies.  L clavicle fx - NWB per ortho, sling C-Spine - CT negative but midline ttp. Flex ex also neg - collar D/Cd Scalp laceration - s/p repair 2/29 Hx PAF Hx AVR on Coumadin - continue coumadin per pharmacy Hx BPV  Hx PSP - followed by Dr. Carles Collet with neurology.  Patient has significant fall history and is unable to mobilize on her own.  She requires assistive device and support of husband at ALL times. HX CKD2 Hx HTN Hx HLD FEN - diet VTE - SCDs, resume coumadin ID - None Foley - None, required I&O, if persists will need foley/flomax Dispo - ICU, TBI team therapies I spoke with her daughter at the bedside. In light of her chronic mobility issues will likely need some form of rehab. Critical Care Total Time*: 40 Minutes  Georganna Skeans, MD, MPH, FACS Trauma & General Surgery Use [email or web address] to contact on call provider  02/25/2023  *Care during the described time interval was provided by me. I have reviewed this patient's available data, including medical history, events of note, physical examination and test results as part of my evaluation.

## 2023-02-25 NOTE — Evaluation (Signed)
Speech Language Pathology Evaluation Patient Details Name: [name] MRN: [id number] DOB: 01/25/1941 Today's Date: 02/25/2023 Time: [id number] SLP Time Calculation (min) (ACUTE ONLY): 29 min  Problem List:  Patient Active Problem List   Diagnosis Date Noted   Fall 02/24/2023   AVD (aortic valve disease) 02/24/2023   Pressure injury of skin 02/08/2022   Rectus sheath hematoma, initial encounter 02/05/2022   Paroxysmal atrial fibrillation ([place]) 04/09/2021   Chronic combined systolic and diastolic heart failure ([place]) 07/17/2020   PSP (progressive supranuclear palsy) ([place]) 02/29/2020   Atrial flutter (H[facility])    Heme positive stool    Symptomatic anemia 12/05/2018   GI bleed 12/05/2018   CKD (chronic kidney disease), stage III ([place]) 12/05/2018   Elevated d-dimer 12/05/2018   SOB (shortness of breath) 12/05/2018   Supratherapeutic INR 12/05/2018   Parkinsonism    Elevated troponin 09/19/2016   Emesis 09/19/2016   Sinus bradycardia 09/16/2016   Family history of colon cancer 07/30/2014   Hiatal hernia 07/30/2014   Chest pain 10/13/2011   Disequilibrium 10/13/2011   Heart valve replaced by other means 04/16/2011   Chronic anticoagulation 03/17/2011   S/P AVR (aortic valve replacement)    PVC's (premature ventricular contractions)    HTN (hypertension)    Hypercholesteremia    Atrial fibrillation with RVR ([place])    GERD (gastroesophageal reflux disease)    GERD 02/06/2010   DIVERTICULOSIS-COLON 02/06/2010   PERSONAL HX COLONIC POLYPS 02/06/2010   MASTECTOMY, HX OF 02/06/2010   Past Medical History:  Past Medical History:  Diagnosis Date   BPV (benign positional vertigo)    Chronic neck pain    C2-3 arthropathy   CKD (chronic kidney disease), stage II    Diverticulosis of colon    GERD (gastroesophageal reflux disease)    Hiatal hernia    History of breast cancer per pt no recurrence   dx 2004-- Left breast DCIS (ER/PR+, HER2 negative) s/p  partial masectomy w/  sln bx and  chemoradiotion (completed 2004)   History of herpes zoster    History of kidney stones    HTN (hypertension)    Hyperlipidemia    Irritable bowel syndrome    LBBB (left bundle branch block)    PAF (paroxysmal atrial fibrillation) ([place])    a. CHA2DS2VASc = 4-->coumadin.   Parkinsonism    neurologist-  dr tat   PONV (postoperative nausea and vomiting)    S/P aortic valve replacement with prosthetic valve    a. 04/13/2006 s/p St Jude mechnical AVR for severe AS;  b. 09/2014 Echo: EF 40-45%, gr1 DD, mild AI/MR, triv TR/PR.   Squamous cell carcinoma of skin 04/06/2022   right nasal sidewall   SUI (stress urinary incontinence, female)    Symptomatic anemia    Venous varices     Past Surgical History:  Past Surgical History:  Procedure Laterality Date   ANTERIOR AND POSTERIOR REPAIR  1990   cystocele/ rectocele   AORTIC VALVE REPLACEMENT  04/13/06   dr gerhardt   and Replacement Ascending Aorta (St. Jude mechanical prosthesis)   APPENDECTOMY  child 1950   BREAST EXCISIONAL BIOPSY Left    BREAST LUMPECTOMY Left 01/11/2003   malignant   BREAST SURGERY Left 2004   CARDIAC CATHETERIZATION  02-22-2001   dr Martinique   minimal non-obstructive cad/  mild aortic stenosis and aortic root enlargement/  normal LVF, ef 65%   CARDIAC CATHETERIZATION  03-22-2006    dr Martinique   no significant obstructive cad/  severe  aortic stenosis and mild to moderate aortic root enlargement/  upper normal right heart pressures   CARDIOVASCULAR STRESS TEST  09-30-2011   dr Martinique   lexiscan nuclear study w/ apical thinning  vs  small prior apical infarct w/ no significant ischemia/  hypokinesis of the distal septum and apex, ef 50%   CARPAL TUNNEL RELEASE Bilateral left 09-09-2004/  right 2007   CATARACT EXTRACTION W/ INTRAOCULAR LENS  IMPLANT, BILATERAL  2015   COLONOSCOPY N/A 10/03/2014   Procedure: COLONOSCOPY;  Surgeon: [name], MD;  Location: [facility];  Service: Endoscopy;  Laterality: N/A;    CYSTOSCOPY WITH RETROGRADE PYELOGRAM, URETEROSCOPY AND STENT PLACEMENT Right 09/17/2016   Procedure: CYSTOSCOPY WITH RIGHT RETROGRADE PYELOGRAM, RIGHT URETEROSCOPY AND STENT PLACEMENT LASER LITHOTRIPSY, RIGHT STENT PLACEMENT;  Surgeon: [name], MD;  Location: [facility];  Service: Urology;  Laterality: Right;   ESOPHAGEAL MANOMETRY N/A 08/12/2014   Procedure: ESOPHAGEAL MANOMETRY (EM);  Surgeon: [name], MD;  Location: [facility];  Service: Endoscopy;  Laterality: N/A;   ESOPHAGOGASTRODUODENOSCOPY N/A 10/03/2014   Procedure: ESOPHAGOGASTRODUODENOSCOPY (EGD);  Surgeon: [name], MD;  Location: [facility];  Service: Endoscopy;  Laterality: N/A;   ESOPHAGOGASTRODUODENOSCOPY N/A 12/08/2018   Procedure: ESOPHAGOGASTRODUODENOSCOPY (EGD);  Surgeon: Doran Stabler, MD;  Location: [facility];  Service: Gastroenterology;  Laterality: N/A;   EXTRACORPOREAL SHOCK WAVE LITHOTRIPSY  1997   FOOT SURGERY Right 2013   hammertoe x2   HOLMIUM LASER APPLICATION Right [id number]   Procedure: HOLMIUM LASER APPLICATION;  Surgeon: [name], MD;  Location: [facility];  Service: Urology;  Laterality: Right;   PERCUTANEOUS NEPHROSTOLITHOTOMY  1993   STONE EXTRACTION WITH BASKET Right 09/17/2016   Procedure: STONE EXTRACTION WITH BASKET;  Surgeon: [name], MD;  Location: [facility];  Service: Urology;  Laterality: Right;   TOTAL ABDOMINAL HYSTERECTOMY  1971   w/ Bilateral Salpingoophorectomy   TRANSTHORACIC ECHOCARDIOGRAM  10/24/2014   dr Martinique   hypokinesis of the basal and mid inferoseptal and inferior walls,  ef 40-45%,  grade 1 diastolic dysfunction/  mechanical bileaflet aortic valve noted w/ mild regur., peak grandient 75mHg, valve area 1.35cm^2/  severe MV calcification without stenosis w/ mild regurg., peak gradient 345mg/  mild LAE/  poss. atrial mass (MRI showed benign lipomatous hypertrophy of atrial septum)  /tFrazier ButtR/mild TR   HPI:  Pt is an [age]o female presenting to the ED after a fall to the R frontal region of head with c/o L chest wall pain and L shoulder pain. CT revealed a small amount of petechial hemorrhage in the R frontal region and a very thin subdural layer of blood with no evidence of any shift or mass effect. PMH includes CKD stage II, GERD, hiatal hernia, breast cancer, diverticulosis, HTN, HLD   Assessment / Plan / Recommendation Clinical Impression  Pt reports no new changes in cognition, which her husband confirms. Due to difficulties with mobility (resulting from PSP diagnosis), her husband describes providing assistance with setting up medications, but pt independently initiates taking them on time. Husband expressed concern regarding her voice, which he states has progressively declined, making it difficult for him to hear her. SLP noted a breathy and low vocal quality. Pt scored a 22/26 on the SLUMS, adjusted due to inability to complete the clock drawing task due to difficulties with dexterity. When adjusted for level of completed education and total possible points, this score is considered WNL, although  SLP did note difficulties with attention and simple problem solving. Since they report her performance is similar to baseline, no additional f/u is necessary during current admission. Recommend referral for [facility] speech therapy to address concerns with pt's voice.    SLP Assessment  SLP Recommendation/Assessment: All further Speech Lanaguage Pathology  needs can be addressed in the next venue of care SLP Visit Diagnosis: Cognitive communication deficit (R41.841)    Recommendations for follow up therapy are one component of a multi-disciplinary discharge planning process, led by the attending physician.  Recommendations may be updated based on patient status, additional functional criteria and insurance authorization.    Follow Up Recommendations  Home health SLP    Assistance  Recommended at Discharge  Frequent or constant Supervision/Assistance  Functional Status Assessment Patient has not had a recent decline in their functional status  Frequency and Duration           SLP Evaluation Cognition  Overall Cognitive Status: History of cognitive impairments - at baseline Arousal/Alertness: Awake/alert Orientation Level: Oriented X4 Attention: Selective Selective Attention: Impaired Selective Attention Impairment: Verbal complex Memory: Appears intact Awareness: Appears intact Problem Solving: Impaired Problem Solving Impairment: Verbal basic Safety/Judgment: Appears intact       Comprehension  Auditory Comprehension Overall Auditory Comprehension: Appears within functional limits for tasks assessed Visual Recognition/Discrimination Discrimination: Within Function Limits Reading Comprehension Reading Status: Not tested    Expression Expression Primary Mode of Expression: Verbal Verbal Expression Overall Verbal Expression: Appears within functional limits for tasks assessed Written Expression Dominant Hand: Right   Oral / Motor  Oral Motor/Sensory Function Overall Oral Motor/Sensory Function: Within functional limits Motor Speech Overall Motor Speech: Impaired at baseline Respiration: Impaired Level of Impairment: Conversation Phonation: Breathy;Low vocal intensity Resonance: Within functional limits Articulation: Within functional limitis Intelligibility: Intelligible Motor Planning: Witnin functional limits            Fabio Asa., Student SLP  02/25/2023, 12:19 PM

## 2023-02-25 NOTE — Progress Notes (Signed)
ANTICOAGULATION CONSULT NOTE - Initial Consult  Pharmacy Consult for warfarin  Indication: atrial fibrillation and mechanical aortic valve   Allergies  Allergen Reactions   Demerol [Meperidine] Shortness Of Breath and Swelling   Oxycodone Other (See Comments)    Hallucinations    Adhesive [Tape] Other (See Comments)    Redness and skin tears   Alendronate     Other reaction(s): heartburn   Ivp Dye [Iodinated Contrast Media] Hives    OK with benadryl pre-med ('50mg'$  one hour before receiving iodinated contrast agent)   Losartan     Other reaction(s): dizzy   Phenergan [Promethazine] Other (See Comments)    Avoids due to reaction with current medications    Patient Measurements: Height: 5' (152.4 cm) Weight: 54 kg (119 lb 0.8 oz) IBW/kg (Calculated) : 45.5  Vital Signs: Temp: 97.6 F (36.4 C) (03/01 0800) Temp Source: Axillary (03/01 0800) BP: 115/58 (03/01 1200) Pulse Rate: 72 (03/01 1200)  Labs: Recent Labs    02/24/23 1105 02/24/23 1115 02/25/23 0650 02/25/23 1104  HGB 11.2* 11.6* 8.8*  --   HCT 35.3* 34.0* 26.5*  --   PLT 202  --  148*  --   LABPROT 26.0*  --   --  23.5*  INR 2.4*  --   --  2.1*  CREATININE 2.02* 2.40* 1.75*  --     Estimated Creatinine Clearance: 17.8 mL/min (A) (by C-G formula based on SCr of 1.75 mg/dL (H)).   Medical History: Past Medical History:  Diagnosis Date   BPV (benign positional vertigo)    Chronic neck pain    C2-3 arthropathy   CKD (chronic kidney disease), stage II    Diverticulosis of colon    GERD (gastroesophageal reflux disease)    Hiatal hernia    History of breast cancer per pt no recurrence   dx 2004-- Left breast DCIS (ER/PR+, HER2 negative) s/p  partial masectomy w/ sln bx and  chemoradiotion (completed 2004)   History of herpes zoster    History of kidney stones    HTN (hypertension)    Hyperlipidemia    Irritable bowel syndrome    LBBB (left bundle branch block)    PAF (paroxysmal atrial fibrillation)  (HCC)    a. CHA2DS2VASc = 4-->coumadin.   Parkinsonism    neurologist-  dr tat   PONV (postoperative nausea and vomiting)    S/P aortic valve replacement with prosthetic valve    a. 04/13/2006 s/p St Jude mechnical AVR for severe AS;  b. 09/2014 Echo: EF 40-45%, gr1 DD, mild AI/MR, triv TR/PR.   Squamous cell carcinoma of skin 04/06/2022   right nasal sidewall   SUI (stress urinary incontinence, female)    Symptomatic anemia    Venous varices      Assessment: Patient admitted with SDH. On warfarin PTA, regimen is 2.'5mg'$  all days except '5mg'$  on Thursdays. Confirmed with husband at bedside. Neurosurgery okay with resuming anticoagulation. INR today subtherapeutic at 2.1, not low enough to require bridging. Risk > benefit   Goal of Therapy:  INR 2.5-3.5 (confirmed w/ husband at bedside)    Plan:  Will give warfarin '5mg'$  x 1 as patient is subtherapeutic and missed yesterday's dose.  Daily INR.   Esmeralda Arthur, PharmD, BCCCP  02/25/2023,1:06 PM

## 2023-02-25 NOTE — TOC CAGE-AID Note (Signed)
Transition of Care [facility]) - CAGE-AID Screening   Patient Details  Name: [name] MRN: [id number] Date of Birth: Aug 02, 1941  [facility]) CM/SW Contact:    Clovis Cao, RN Phone Number: [phone] 02/25/2023, 1:24 PM   Clinical Narrative: Pt here after sustaining a SDH with scalp lac as well as a L clavicle fx.  Pt denies alcohol or drug use.  Screening complete.   CAGE-AID Screening:    Have You Ever Felt You Ought to Cut Down on Your Drinking or Drug Use?: No Have People Annoyed You By Critizing Your Drinking Or Drug Use?: No Have You Felt Bad Or Guilty About Your Drinking Or Drug Use?: No Have You Ever Had a Drink or Used Drugs First Thing In The Morning to Steady Your Nerves or to Get Rid of a Hangover?: No CAGE-AID Score: 0  Substance Abuse Education Offered: No

## 2023-02-25 NOTE — Evaluation (Signed)
Physical Therapy Evaluation Patient Details Name: [name] MRN: [id number] DOB: 23-Sep-1941 Today's Date: 02/25/2023  History of Present Illness  The pt is an 82 yo female presenting 2/29 after falling while getting out of her car. Imaging showed TBI/R SDH/contusion of R frontal lobe, and L clavicle fx. PMH includes: supranuclear palsy, BPV, HTN, GERD, CKD II, afib, HLD, mechanical aortic valve replacement on Coumadin therapy, and previous breast cancer.   Clinical Impression  Pt in bed upon arrival of PT, agreeable to evaluation at this time. Prior to admission the pt was ambulatory with use of rollator and support from her spouse due to frequent falls from supranuclear palsy. The pt now presents with limitations in functional mobility, strength, stability, and endurance due to above dx, and will continue to benefit from skilled PT to address these deficits. The pt was limited to short distance mobility (to and from [facility]) this session, and required up to modA to manage sit-stand transfers and balance with gait. She presents with strong posterior lean in standing and short shuffling steps which further contribute to assist needed, but spouse reports this is close to baseline. Given good support (spouse and daughter) at home, will work towards d/c home with [place] when pt medically stable for d/c.         Recommendations for follow up therapy are one component of a multi-disciplinary discharge planning process, led by the attending physician.  Recommendations may be updated based on patient status, additional functional criteria and insurance authorization.  Follow Up Recommendations Home health PT      Assistance Recommended at Discharge Frequent or constant Supervision/Assistance  Patient can return home with the following  A lot of help with walking and/or transfers;A lot of help with bathing/dressing/bathroom;Assistance with cooking/housework;Direct supervision/assist for medications  management;Direct supervision/assist for financial management;Assist for transportation;Help with stairs or ramp for entrance    Equipment Recommendations None recommended by PT  Recommendations for Other Services       Functional Status Assessment Patient has had a recent decline in their functional status and demonstrates the ability to make significant improvements in function in a reasonable and predictable amount of time.     Precautions / Restrictions Precautions Precautions: Fall Precaution Comments: pt is a high fall risk Restrictions Weight Bearing Restrictions: Yes LUE Weight Bearing: Non weight bearing Other Position/Activity Restrictions: NWB in sling      Mobility  Bed Mobility Overal bed mobility: Needs Assistance Bed Mobility: Supine to Sit, Sit to Supine     Supine to sit: Min assist Sit to supine: Mod assist   General bed mobility comments: minA to manage trunk without use of LUE, modA to return to bed with NWB    Transfers Overall transfer level: Needs assistance Equipment used: 2 person hand held assist Transfers: Sit to/from Stand, Bed to chair/wheelchair/BSC Sit to Stand: Min assist, +2 physical assistance   Step pivot transfers: Min assist       General transfer comment: minA to rise to standing, cues for continued NWB of LUE, min-modA to manage steps to and from [facility]. posterior lean    Ambulation/Gait Ambulation/Gait assistance: Mod assist, +2 physical assistance Gait Distance (Feet): 3 Feet Assistive device: 1 person hand held assist Gait Pattern/deviations: Step-to pattern, Decreased stride length Gait velocity: decreased Gait velocity interpretation: <1.31 ft/sec, indicative of household ambulator   General Gait Details: small shuffling steps with minimal clearance (per spouse, this is baseline). posterior lean needing min-modA to steady   Modified Rankin (Stroke  Patients Only) Modified Rankin (Stroke Patients Only) Pre-Morbid Rankin  Score: Moderately severe disability Modified Rankin: Moderately severe disability     Balance Overall balance assessment: Needs assistance Sitting-balance support: No upper extremity supported, Feet supported Sitting balance-Leahy Scale: Fair   Postural control: Posterior lean Standing balance support: Single extremity supported Standing balance-Leahy Scale: Poor                               Pertinent Vitals/Pain Pain Assessment Pain Assessment: No/denies pain Pain Score: 0-No pain Pain Intervention(s): Monitored during session    Home Living Family/patient expects to be discharged to:: Private residence Living Arrangements: Spouse/significant other Available Help at Discharge: Family;Available 24 hours/day Type of Home: House Home Access: Stairs to enter Entrance Stairs-Rails: None Entrance Stairs-Number of Steps: 1 Alternate Level Stairs-Number of Steps: flight Home Layout: Two level;Able to live on main level with bedroom/bathroom Home Equipment: Rollator (4 wheels);Grab bars - toilet;Grab bars - tub/shower;Wheelchair - Biomedical engineer Comments: will furniture walk or walk with husband, uses helmet for mobility at home    Prior Function Prior Level of Function : Needs assist             Mobility Comments: hands on assist with rollator for walking ADLs Comments: husband assists with bathing and dressing     Hand Dominance   Dominant Hand: Right    Extremity/Trunk Assessment   Upper Extremity Assessment Upper Extremity Assessment: Defer to OT evaluation;LUE deficits/detail LUE Deficits / Details: clavicle fx    Lower Extremity Assessment Lower Extremity Assessment: Generalized weakness    Cervical / Trunk Assessment Cervical / Trunk Assessment: Kyphotic  Communication   Communication: No difficulties  Cognition Arousal/Alertness: Awake/alert Behavior During Therapy: Flat affect Overall Cognitive Status:  Impaired/Different from baseline Area of Impairment: Safety/judgement, Awareness, Problem solving                         Safety/Judgement: Decreased awareness of deficits Awareness: Intellectual Problem Solving: Decreased initiation, Requires verbal cues General Comments: pt with flat affect, slow to respond at times but following cues well in session. able to state date appropriately, but daughter reports telling pt date earlier in morning        General Comments General comments (skin integrity, edema, etc.): VSS on RA        Assessment/Plan    PT Assessment Patient needs continued PT services  PT Problem List Decreased strength;Decreased activity tolerance;Decreased balance;Decreased mobility       PT Treatment Interventions Gait training;DME instruction;Functional mobility training;Therapeutic activities;Therapeutic exercise;Balance training;Patient/family education    PT Goals (Current goals can be found in the Care Plan section)  Acute Rehab PT Goals Patient Stated Goal: return home PT Goal Formulation: With patient/family Time For Goal Achievement: 03/11/23 Potential to Achieve Goals: Good    Frequency Min 4X/week     Co-evaluation PT/OT/SLP Co-Evaluation/Treatment: Yes Reason for Co-Treatment: Complexity of the patient's impairments (multi-system involvement);Necessary to address cognition/behavior during functional activity;For patient/therapist safety;To address functional/ADL transfers PT goals addressed during session: Mobility/safety with mobility;Balance;Strengthening/ROM         AM-PAC PT "6 Clicks" Mobility  Outcome Measure Help needed turning from your back to your side while in a flat bed without using bedrails?: A Little Help needed moving from lying on your back to sitting on the side of a flat bed without using bedrails?: A Lot Help needed moving to and from a  bed to a chair (including a wheelchair)?: A Lot Help needed standing up from  a chair using your arms (e.g., wheelchair or bedside chair)?: A Lot Help needed to walk in hospital room?: Total Help needed climbing 3-5 steps with a railing? : Total 6 Click Score: 11    End of Session Equipment Utilized During Treatment: Gait belt Activity Tolerance: Patient tolerated treatment well Patient left: in bed;with call bell/phone within reach;with nursing/sitter in room (for foley placement) Nurse Communication: Mobility status;Weight bearing status PT Visit Diagnosis: Other abnormalities of gait and mobility (R26.89);Muscle weakness (generalized) (M62.81);Repeated falls (R29.6);History of falling (Z91.81)    Time: [id number] PT Time Calculation (min) (ACUTE ONLY): 37 min   Charges:   PT Evaluation $PT Eval Moderate Complexity: 1 Mod          West Carbo, PT, DPT   Acute Rehabilitation Department Office [place] Communication Preferred  Sandra Cockayne 02/25/2023, 9:24 AM

## 2023-02-25 NOTE — Progress Notes (Signed)
Patient ID: [name], female   DOB: 21-Jul-1941, 82 y.o.   MRN: [id number] Patients condition and CT head remain stable. Ok to continue anticoagulation.Please call if further intervention necessary.

## 2023-02-26 LAB — BASIC METABOLIC PANEL
Anion gap: 7 (ref 5–15)
BUN: 28 mg/dL — ABNORMAL HIGH (ref 8–23)
CO2: 28 mmol/L (ref 22–32)
Calcium: 8.7 mg/dL — ABNORMAL LOW (ref 8.9–10.3)
Chloride: 100 mmol/L (ref 98–111)
Creatinine, Ser: 2.04 mg/dL — ABNORMAL HIGH (ref 0.44–1.00)
GFR, Estimated: 24 mL/min — ABNORMAL LOW (ref >60)
Glucose, Bld: 86 mg/dL (ref 70–99)
Potassium: 3.7 mmol/L (ref 3.5–5.1)
Sodium: 135 mmol/L (ref 135–145)

## 2023-02-26 LAB — CBC
HCT: 25.6 % — ABNORMAL LOW (ref 36.0–46.0)
Hemoglobin: 8.4 g/dL — ABNORMAL LOW (ref 12.0–15.0)
MCH: 33.7 pg (ref 26.0–34.0)
MCHC: 32.8 g/dL (ref 30.0–36.0)
MCV: 102.8 fL — ABNORMAL HIGH (ref 80.0–100.0)
Platelets: 149 10*3/uL — ABNORMAL LOW (ref 150–400)
RBC: 2.49 MIL/uL — ABNORMAL LOW (ref 3.87–5.11)
RDW: 13.5 % (ref 11.5–15.5)
WBC: 4.9 10*3/uL (ref 4.0–10.5)
nRBC: 0 % (ref 0.0–0.2)

## 2023-02-26 LAB — PROTIME-INR
INR: 2 — ABNORMAL HIGH (ref 0.8–1.2)
Prothrombin Time: 22.4 seconds — ABNORMAL HIGH (ref 11.4–15.2)

## 2023-02-26 MED ORDER — MENTHOL 3 MG MT LOZG
1.0000 | LOZENGE | OROMUCOSAL | Status: DC | PRN
  Administered 2023-02-26: 3 mg via ORAL
  Filled 2023-02-26: qty 9

## 2023-02-26 MED ORDER — WARFARIN SODIUM 5 MG PO TABS
5.0000 mg | ORAL_TABLET | Freq: Once | ORAL | Status: AC
  Administered 2023-02-26: 5 mg via ORAL
  Filled 2023-02-26: qty 1

## 2023-02-26 MED ORDER — GUAIFENESIN 100 MG/5ML PO LIQD
5.0000 mL | Freq: Every evening | ORAL | Status: DC | PRN
  Administered 2023-02-26 – 2023-03-03 (×4): 5 mL via ORAL
  Filled 2023-02-26 (×3): qty 10
  Filled 2023-02-26: qty 15

## 2023-02-26 NOTE — Progress Notes (Addendum)
Patient ID: [name], female   DOB: [birth year], 82 y.o.   MRN: [id number] Follow up - Trauma Critical Care   Patient Details:    [name] is an 82 y.o. female.  Lines/tubes : External Urinary Catheter (Active)  Collection Container Dedicated Suction Canister 02/24/23 2000  Suction (Verified suction is between 40-80 mmHg) Yes 02/24/23 2000  Securement Method None needed 02/24/23 2000  Site Assessment Clean, Dry, Intact 02/24/23 2000    Microbiology/Sepsis markers: Results for orders placed or performed during the hospital encounter of 02/05/22  Resp Panel by RT-PCR (Flu A&B, Covid) Nasopharyngeal Swab     Status: None   Collection Time: 02/05/22  9:45 AM   Specimen: Nasopharyngeal Swab; Nasopharyngeal(NP) swabs in vial transport medium  Result Value Ref Range Status   SARS Coronavirus 2 by RT PCR NEGATIVE NEGATIVE Final    Comment: (NOTE) SARS-CoV-2 target nucleic acids are NOT DETECTED.  The SARS-CoV-2 RNA is generally detectable in upper respiratory specimens during the acute phase of infection. The lowest concentration of SARS-CoV-2 viral copies this assay can detect is 138 copies/mL. A negative result does not preclude SARS-Cov-2 infection and should not be used as the sole basis for treatment or other patient management decisions. A negative result may occur with  improper specimen collection/handling, submission of specimen other than nasopharyngeal swab, presence of viral mutation(s) within the areas targeted by this assay, and inadequate number of viral copies(<138 copies/mL). A negative result must be combined with clinical observations, patient history, and epidemiological information. The expected result is Negative.  Fact Sheet for Patients:  EntrepreneurPulse.com.au  Fact Sheet for Healthcare Providers:  IncredibleEmployment.be  This test is no t yet approved or cleared by the [place] FDA and   has been authorized for detection and/or diagnosis of SARS-CoV-2 by FDA under an Emergency Use Authorization (EUA). This EUA will remain  in effect (meaning this test can be used) for the duration of the COVID-19 declaration under Section 564(b)(1) of the Act, 21 U.S.C.section 360bbb-3(b)(1), unless the authorization is terminated  or revoked sooner.       Influenza A by PCR NEGATIVE NEGATIVE Final   Influenza B by PCR NEGATIVE NEGATIVE Final    Comment: (NOTE) The Xpert Xpress SARS-CoV-2/FLU/RSV plus assay is intended as an aid in the diagnosis of influenza from Nasopharyngeal swab specimens and should not be used as a sole basis for treatment. Nasal washings and aspirates are unacceptable for Xpert Xpress SARS-CoV-2/FLU/RSV testing.  Fact Sheet for Patients: EntrepreneurPulse.com.au  Fact Sheet for Healthcare Providers: IncredibleEmployment.be  This test is not yet approved or cleared by the [place] FDA and has been authorized for detection and/or diagnosis of SARS-CoV-2 by FDA under an Emergency Use Authorization (EUA). This EUA will remain in effect (meaning this test can be used) for the duration of the COVID-19 declaration under Section 564(b)(1) of the Act, 21 U.S.C. section 360bbb-3(b)(1), unless the authorization is terminated or revoked.  Performed at [facility], [place] [address] [place], [place] [address]   Urine Culture     Status: Abnormal   Collection Time: 02/05/22  3:11 PM   Specimen: Urine, Clean Catch  Result Value Ref Range Status   Specimen Description URINE, CLEAN CATCH  Final   Special Requests   Final    NONE Performed at [facility], [place] [address] [place], [place] [address]    Culture MULTIPLE SPECIES PRESENT, SUGGEST RECOLLECTION (A)  Final   Report Status 02/07/2022  FINAL  Final    Anti-infectives:  Anti-infectives (From admission, onward)    None     Consults: Treatment Team:   Altamese Rice, MD    Studies:    Events:  Subjective:    Overnight Issues:  stable Objective:  Vital signs for last 24 hours: Temp:  [98 F (36.7 C)-98.1 F (36.7 C)] 98.1 F (36.7 C) (03/02 0400) Pulse Rate:  [42-80] 76 (03/02 0900) Resp:  [14-27] 19 (03/02 0900) BP: (93-128)/(42-85) 113/73 (03/02 0900) SpO2:  [92 %-97 %] 97 % (03/02 0900)  Hemodynamic parameters for last 24 hours:    Intake/Output from previous day: 03/01 0701 - 03/02 0700 In: -  Out: 550 [Urine:550]  Intake/Output this shift: Total I/O In: 240 [P.O.:240] Out: 215 [Urine:215]  Vent settings for last 24 hours:    Physical Exam:  General: no respiratory distress Neuro: arouses and F/C, MAE HEENT/Neck: no JVD Resp: clear to auscultation bilaterally CVS: RRR brady GI: soft, NT Extremities: calves soft  Results for orders placed or performed during the hospital encounter of 02/24/23 (from the past 24 hour(s))  Protime-INR     Status: Abnormal   Collection Time: 02/25/23 11:04 AM  Result Value Ref Range   Prothrombin Time 23.5 (H) 11.4 - 15.2 seconds   INR 2.1 (H) 0.8 - 1.2  CBC     Status: Abnormal   Collection Time: 02/26/23  5:57 AM  Result Value Ref Range   WBC 4.9 4.0 - 10.5 K/uL   RBC 2.49 (L) 3.87 - 5.11 MIL/uL   Hemoglobin 8.4 (L) 12.0 - 15.0 g/dL   HCT 25.6 (L) 36.0 - 46.0 %   MCV 102.8 (H) 80.0 - 100.0 fL   MCH 33.7 26.0 - 34.0 pg   MCHC 32.8 30.0 - 36.0 g/dL   RDW 13.5 11.5 - 15.5 %   Platelets 149 (L) 150 - 400 K/uL   nRBC 0.0 0.0 - 0.2 %  Basic metabolic panel     Status: Abnormal   Collection Time: 02/26/23  5:57 AM  Result Value Ref Range   Sodium 135 135 - 145 mmol/L   Potassium 3.7 3.5 - 5.1 mmol/L   Chloride 100 98 - 111 mmol/L   CO2 28 22 - 32 mmol/L   Glucose, Bld 86 70 - 99 mg/dL   BUN 28 (H) 8 - 23 mg/dL   Creatinine, Ser 2.04 (H) 0.44 - 1.00 mg/dL   Calcium 8.7 (L) 8.9 - 10.3 mg/dL   GFR, Estimated 24 (L) >60 mL/min   Anion gap 7 5 - 15  Protime-INR      Status: Abnormal   Collection Time: 02/26/23  5:57 AM  Result Value Ref Range   Prothrombin Time 22.4 (H) 11.4 - 15.2 seconds   INR 2.0 (H) 0.8 - 1.2    Assessment & Plan: Present on Admission:  Fall    LOS: 2 days   Additional comments:I reviewed the patient's new clinical lab test results.   Fall on coumadin TBI/R SDH/Contusion of the R frontal lobe + vertex - NSGY consult. Dr. Ellene Route. No acute intervention. No need to reverse anticoagulation.  He is ok with what cardiology needs to do in regards to her anticoagulation. Repeat CTH stable 3/1. Keppra for seizure ppx. Dr. Grandville Silos d/w Dr. Ellene Route - OK to continue coumadin. ICU for frequent neuro checks. TBI team therapies.  L clavicle fx - NWB per ortho, sling C-Spine - CT negative but midline ttp. Flex ex also neg - collar  D/Cd Scalp laceration - s/p repair 2/29 Hx PAF Hx AVR on Coumadin - continue coumadin per pharmacy Hx BPV  Hx PSP - followed by Dr. Carles Collet with neurology.  Patient has significant fall history and is unable to mobilize on her own.  She requires assistive device and support of husband at ALL times. HX CKD2 Hx HTN Hx HLD FEN - diet VTE - SCDs, resume coumadin ID - None Foley - None, required I&O, if persists will need foley/flomax Dispo - ICU --> 4NP today, TBI team therapies In light of her chronic mobility issues will likely need some form of rehab. Critical Care Total Time*: 40 Minutes  Check [email or web address] for General Surgery coverage night/weekend/holidays  Page if acute issues. No secure chat available for me given surgeries/clinic/off post call which would lead to a delay in care.  Nadeen Landau, MD [facility], [facility]  02/26/2023  *Care during the described time interval was provided by me. I have reviewed this patient's available data, including medical history, events of note, physical examination and test results as part of my evaluation.

## 2023-02-26 NOTE — Progress Notes (Signed)
Physical Therapy Treatment Patient Details Name: [name] MRN: [id number] DOB: 06-28-1941 Today's Date: 02/26/2023   History of Present Illness The pt is an 82 yo female presenting 2/29 after falling while getting out of her car. Imaging showed TBI/R SDH/contusion of R frontal lobe, and L clavicle fx. PMH includes: supranuclear palsy, BPV, HTN, GERD, CKD II, afib, HLD, mechanical aortic valve replacement on Coumadin therapy, and previous breast cancer.    PT Comments    The pt was agreeable to session and able to demo improvement in sit-stand transfers and progression of ambulation distance at this time. She continues to present with significant balance deficits, and needed min-modA of 2 to complete OOB mobility, but family was present for session and states the pt's mobility is similar to baseline. The pt will continue to benefit from skilled PT acutely to address bed mobility training with the spouse to improve his comfort with assisting the patient, and continue trial of SPC (as pt is no longer able to use rollator due to NWB LUE).    Recommendations for follow up therapy are one component of a multi-disciplinary discharge planning process, led by the attending physician.  Recommendations may be updated based on patient status, additional functional criteria and insurance authorization.  Follow Up Recommendations  Home health PT     Assistance Recommended at Discharge Frequent or constant Supervision/Assistance  Patient can return home with the following A lot of help with walking and/or transfers;A lot of help with bathing/dressing/bathroom;Assistance with cooking/housework;Direct supervision/assist for medications management;Direct supervision/assist for financial management;Assist for transportation;Help with stairs or ramp for entrance   Equipment Recommendations  None recommended by PT    Recommendations for Other Services       Precautions / Restrictions  Precautions Precautions: Fall Precaution Comments: pt is a high fall risk Restrictions Weight Bearing Restrictions: Yes LUE Weight Bearing: Non weight bearing Other Position/Activity Restrictions: NWB in sling     Mobility  Bed Mobility Overal bed mobility: Needs Assistance Bed Mobility: Supine to Sit     Supine to sit: Min assist     General bed mobility comments: minA to elevate trunk to EOB    Transfers Overall transfer level: Needs assistance Equipment used: 1 person hand held assist Transfers: Sit to/from Stand, Bed to chair/wheelchair/BSC Sit to Stand: Min assist, +2 physical assistance   Step pivot transfers: Min assist, +2 physical assistance       General transfer comment: minA to rise to standing, up to modA to manage pivotal steps due to small BOS and LOB.    Ambulation/Gait Ambulation/Gait assistance: Mod assist, +2 physical assistance Gait Distance (Feet): 5 Feet Assistive device: Straight cane Gait Pattern/deviations: Step-to pattern, Decreased stride length, Shuffle, Scissoring, Narrow base of support Gait velocity: decreased Gait velocity interpretation: <1.31 ft/sec, indicative of household ambulator   General Gait Details: small shuffling steps with narrow BOS and LOB in all directions but mostly posterior when stepping back to recliner. Pt with intermittent correct use of SPC, but at times just carrying it. better straight than navigating in tight spaces.  Modified Rankin (Stroke Patients Only) Modified Rankin (Stroke Patients Only) Pre-Morbid Rankin Score: Moderately severe disability Modified Rankin: Moderately severe disability     Balance Overall balance assessment: Needs assistance Sitting-balance support: No upper extremity supported, Feet supported Sitting balance-Leahy Scale: Fair   Postural control: Posterior lean Standing balance support: Single extremity supported Standing balance-Leahy Scale: Poor  Cognition Arousal/Alertness: Awake/alert Behavior During Therapy: Flat affect Overall Cognitive Status: Impaired/Different from baseline Area of Impairment: Safety/judgement, Awareness, Problem solving                         Safety/Judgement: Decreased awareness of deficits Awareness: Intellectual Problem Solving: Decreased initiation, Requires verbal cues General Comments: pt agreeable to session, able to verbalize needs such as using bathroom. benefits from max cues for sequencing and safety, family reports ths is baseline        Exercises General Exercises - Lower Extremity Long Arc Quad: AROM, Both, 5 reps, Seated Hip Flexion/Marching: AROM, Both, 5 reps, Seated Heel Raises: AROM, Both, 5 reps, Seated    General Comments General comments (skin integrity, edema, etc.): VSS on RA      Pertinent Vitals/Pain Pain Assessment Pain Assessment: No/denies pain Faces Pain Scale: No hurt Pain Intervention(s): Monitored during session     PT Goals (current goals can now be found in the care plan section) Acute Rehab PT Goals Patient Stated Goal: return home PT Goal Formulation: With patient/family Time For Goal Achievement: 03/11/23 Potential to Achieve Goals: Good Progress towards PT goals: Progressing toward goals    Frequency    Min 4X/week      PT Plan Current plan remains appropriate       AM-PAC PT "6 Clicks" Mobility   Outcome Measure  Help needed turning from your back to your side while in a flat bed without using bedrails?: A Little Help needed moving from lying on your back to sitting on the side of a flat bed without using bedrails?: A Lot Help needed moving to and from a bed to a chair (including a wheelchair)?: A Lot Help needed standing up from a chair using your arms (e.g., wheelchair or bedside chair)?: A Lot Help needed to walk in hospital room?: Total Help needed climbing 3-5 steps with a railing? : Total 6 Click Score:  11    End of Session Equipment Utilized During Treatment: Gait belt Activity Tolerance: Patient tolerated treatment well Patient left: with call bell/phone within reach;in chair;with chair alarm set;with family/visitor present Nurse Communication: Mobility status;Weight bearing status PT Visit Diagnosis: Other abnormalities of gait and mobility (R26.89);Muscle weakness (generalized) (M62.81);Repeated falls (R29.6);History of falling (Z91.81)     Time: [id number] PT Time Calculation (min) (ACUTE ONLY): 46 min  Charges:  $Gait Training: 8-22 mins $Therapeutic Exercise: 8-22 mins $Therapeutic Activity: 8-22 mins                     West Carbo, PT, DPT   Acute Rehabilitation Department Office [phone] Secure Chat Communication Preferred   Sandra Cockayne 02/26/2023, 10:28 AM

## 2023-02-26 NOTE — Progress Notes (Signed)
ANTICOAGULATION CONSULT NOTE - Initial Consult  Pharmacy Consult for warfarin  Indication: atrial fibrillation and mechanical aortic valve   Allergies  Allergen Reactions   Demerol [Meperidine] Shortness Of Breath and Swelling   Oxycodone Other (See Comments)    Hallucinations    Adhesive [Tape] Other (See Comments)    Redness and skin tears   Alendronate     Other reaction(s): heartburn   Ivp Dye [Iodinated Contrast Media] Hives    OK with benadryl pre-med ('50mg'$  one hour before receiving iodinated contrast agent)   Losartan     Other reaction(s): dizzy   Phenergan [Promethazine] Other (See Comments)    Avoids due to reaction with current medications    Patient Measurements: Height: 5' (152.4 cm) Weight: 54 kg (119 lb 0.8 oz) IBW/kg (Calculated) : 45.5  Vital Signs: Temp: 98.1 F (36.7 C) (03/02 0400) Temp Source: Oral (03/02 0400) BP: 113/73 (03/02 0900) Pulse Rate: 76 (03/02 0900)  Labs: Recent Labs    02/24/23 1105 02/24/23 1115 02/25/23 0650 02/25/23 1104 02/26/23 0557  HGB 11.2* 11.6* 8.8*  --  8.4*  HCT 35.3* 34.0* 26.5*  --  25.6*  PLT 202  --  148*  --  149*  LABPROT 26.0*  --   --  23.5* 22.4*  INR 2.4*  --   --  2.1* 2.0*  CREATININE 2.02* 2.40* 1.75*  --  2.04*     Estimated Creatinine Clearance: 15.3 mL/min (A) (by C-G formula based on SCr of 2.04 mg/dL (H)).   Medical History: Past Medical History:  Diagnosis Date   BPV (benign positional vertigo)    Chronic neck pain    C2-3 arthropathy   CKD (chronic kidney disease), stage II    Diverticulosis of colon    GERD (gastroesophageal reflux disease)    Hiatal hernia    History of breast cancer per pt no recurrence   dx 2004-- Left breast DCIS (ER/PR+, HER2 negative) s/p  partial masectomy w/ sln bx and  chemoradiotion (completed 2004)   History of herpes zoster    History of kidney stones    HTN (hypertension)    Hyperlipidemia    Irritable bowel syndrome    LBBB (left bundle branch  block)    PAF (paroxysmal atrial fibrillation) (HCC)    a. CHA2DS2VASc = 4-->coumadin.   Parkinsonism    neurologist-  dr tat   PONV (postoperative nausea and vomiting)    S/P aortic valve replacement with prosthetic valve    a. 04/13/2006 s/p St Jude mechnical AVR for severe AS;  b. 09/2014 Echo: EF 40-45%, gr1 DD, mild AI/MR, triv TR/PR.   Squamous cell carcinoma of skin 04/06/2022   right nasal sidewall   SUI (stress urinary incontinence, female)    Symptomatic anemia    Venous varices      Assessment: Patient admitted with SDH. On warfarin PTA, regimen is 2.'5mg'$  all days except '5mg'$  on Thursdays. Confirmed with husband at bedside. Neurosurgery okay with resuming anticoagulation. INR today subtherapeutic at 2, not low enough to require bridging. Risk > benefit   Goal of Therapy:  INR 2.5-3.5 (confirmed w/ husband at bedside)    Plan:  Will give warfarin '5mg'$  x 1 as patient is subtherapeutic and missed Thursday's dose Daily INR.   Dimple Nanas, PharmD, BCPS 02/26/2023 9:17 AM

## 2023-02-27 LAB — BASIC METABOLIC PANEL
Anion gap: 6 (ref 5–15)
BUN: 28 mg/dL — ABNORMAL HIGH (ref 8–23)
CO2: 28 mmol/L (ref 22–32)
Calcium: 8.7 mg/dL — ABNORMAL LOW (ref 8.9–10.3)
Chloride: 104 mmol/L (ref 98–111)
Creatinine, Ser: 2.15 mg/dL — ABNORMAL HIGH (ref 0.44–1.00)
GFR, Estimated: 22 mL/min — ABNORMAL LOW (ref >60)
Glucose, Bld: 82 mg/dL (ref 70–99)
Potassium: 3.9 mmol/L (ref 3.5–5.1)
Sodium: 138 mmol/L (ref 135–145)

## 2023-02-27 LAB — CBC
HCT: 26.5 % — ABNORMAL LOW (ref 36.0–46.0)
Hemoglobin: 8.6 g/dL — ABNORMAL LOW (ref 12.0–15.0)
MCH: 33.6 pg (ref 26.0–34.0)
MCHC: 32.5 g/dL (ref 30.0–36.0)
MCV: 103.5 fL — ABNORMAL HIGH (ref 80.0–100.0)
Platelets: 149 10*3/uL — ABNORMAL LOW (ref 150–400)
RBC: 2.56 MIL/uL — ABNORMAL LOW (ref 3.87–5.11)
RDW: 13.4 % (ref 11.5–15.5)
WBC: 5.3 10*3/uL (ref 4.0–10.5)
nRBC: 0 % (ref 0.0–0.2)

## 2023-02-27 LAB — PROTIME-INR
INR: 2.7 — ABNORMAL HIGH (ref 0.8–1.2)
Prothrombin Time: 28.5 seconds — ABNORMAL HIGH (ref 11.4–15.2)

## 2023-02-27 MED ORDER — CARBIDOPA-LEVODOPA 25-250 MG PO TABS
2.0000 | ORAL_TABLET | ORAL | Status: DC
  Administered 2023-03-02 – 2023-03-05 (×12): 2 via ORAL
  Filled 2023-02-27 (×12): qty 2

## 2023-02-27 MED ORDER — CARBIDOPA-LEVODOPA 25-250 MG PO TABS
1.0000 | ORAL_TABLET | ORAL | Status: DC
  Administered 2023-02-28 – 2023-03-08 (×10): 1 via ORAL
  Filled 2023-02-27 (×10): qty 1

## 2023-02-27 MED ORDER — CARBIDOPA-LEVODOPA 25-250 MG PO TABS
1.0000 | ORAL_TABLET | ORAL | Status: DC
  Administered 2023-02-27 – 2023-03-07 (×5): 1 via ORAL
  Filled 2023-02-27 (×6): qty 1

## 2023-02-27 MED ORDER — WARFARIN SODIUM 1 MG PO TABS
1.0000 mg | ORAL_TABLET | Freq: Once | ORAL | Status: AC
  Administered 2023-02-27: 1 mg via ORAL
  Filled 2023-02-27: qty 1

## 2023-02-27 NOTE — Progress Notes (Signed)
Pt admitted to the unit as a transfer from 4N ICU. Pt alert and verbally responsive; telemetry applied and verified with CCMD; pt assessed to be brady/heart block on the monitor. HR in upper 40's; 45 to 49 range. Pt's prior nurse Beverlee Nims endorses pt HR can go down to 45 and but not sustaining and that MD is aware. No pressure ulcer noted except for pt's head abrasion from fall. Pt asymptomatic and resting comfortably in bed with call light within reach and bed alarm on. Will continue closely monitor. Delia Heady RN    02/27/23 1636  Vitals  Temp (!) 97.5 F (36.4 C)  Temp Source Oral  BP (!) 127/49  MAP (mmHg) 70  BP Location Right Arm  BP Method Automatic  Patient Position (if appropriate) Lying  Pulse Rate (!) 47  Resp 20  MEWS COLOR  MEWS Score Color Green  Oxygen Therapy  SpO2 100 %  O2 Device Room Air  MEWS Score  MEWS Temp 0  MEWS Systolic 0  MEWS Pulse 1  MEWS RR 0  MEWS LOC 0  MEWS Score 1

## 2023-02-27 NOTE — Progress Notes (Addendum)
ANTICOAGULATION CONSULT NOTE - Initial Consult  Pharmacy Consult for warfarin  Indication: atrial fibrillation and mechanical aortic valve   Allergies  Allergen Reactions   Demerol [Meperidine] Shortness Of Breath and Swelling   Oxycodone Other (See Comments)    Hallucinations    Adhesive [Tape] Other (See Comments)    Redness and skin tears   Alendronate     Other reaction(s): heartburn   Ivp Dye [Iodinated Contrast Media] Hives    OK with benadryl pre-med ('50mg'$  one hour before receiving iodinated contrast agent)   Losartan     Other reaction(s): dizzy   Phenergan [Promethazine] Other (See Comments)    Avoids due to reaction with current medications    Patient Measurements: Height: 5' (152.4 cm) Weight: 54 kg (119 lb 0.8 oz) IBW/kg (Calculated) : 45.5  Vital Signs: Temp: 97.8 F (36.6 C) (03/03 0800) Temp Source: Oral (03/03 0800) BP: 124/59 (03/03 0615) Pulse Rate: 47 (03/03 0800)  Labs: Recent Labs    02/25/23 0650 02/25/23 1104 02/26/23 0557 02/27/23 0707  HGB 8.8*  --  8.4* 8.6*  HCT 26.5*  --  25.6* 26.5*  PLT 148*  --  149* 149*  LABPROT  --  23.5* 22.4* 28.5*  INR  --  2.1* 2.0* 2.7*  CREATININE 1.75*  --  2.04* 2.15*     Estimated Creatinine Clearance: 14.5 mL/min (A) (by C-G formula based on SCr of 2.15 mg/dL (H)).   Medical History: Past Medical History:  Diagnosis Date   BPV (benign positional vertigo)    Chronic neck pain    C2-3 arthropathy   CKD (chronic kidney disease), stage II    Diverticulosis of colon    GERD (gastroesophageal reflux disease)    Hiatal hernia    History of breast cancer per pt no recurrence   dx 2004-- Left breast DCIS (ER/PR+, HER2 negative) s/p  partial masectomy w/ sln bx and  chemoradiotion (completed 2004)   History of herpes zoster    History of kidney stones    HTN (hypertension)    Hyperlipidemia    Irritable bowel syndrome    LBBB (left bundle branch block)    PAF (paroxysmal atrial fibrillation)  (HCC)    a. CHA2DS2VASc = 4-->coumadin.   Parkinsonism    neurologist-  dr tat   PONV (postoperative nausea and vomiting)    S/P aortic valve replacement with prosthetic valve    a. 04/13/2006 s/p St Jude mechnical AVR for severe AS;  b. 09/2014 Echo: EF 40-45%, gr1 DD, mild AI/MR, triv TR/PR.   Squamous cell carcinoma of skin 04/06/2022   right nasal sidewall   SUI (stress urinary incontinence, female)    Symptomatic anemia    Venous varices      Assessment: Patient admitted with SDH. On warfarin PTA, regimen is 2.'5mg'$  all days except '5mg'$  on Thursdays. Confirmed with husband at bedside. Neurosurgery okay with resuming anticoagulation.   INR therapeutic at 2.7 (noted large increase from INR 2 >> 2.7 today). CBC stable. Thus far has received same total weekly dose of warfarin (missed dose on 2/29 and increased doses 3/1, 3/2). Given large jump in INR and SDH will dose warfarin conservatively tonight.  Goal of Therapy:  INR 2.5-3.5 (confirmed w/ husband at bedside)    Plan:  Warfarin 1 mg PO x1 this evening Daily INR.   Dimple Nanas, PharmD, BCPS 02/27/2023 8:33 AM

## 2023-02-27 NOTE — Progress Notes (Signed)
Patient ID: [name], female   DOB: [birth year], 82 y.o.   MRN: [id number] Follow up - Trauma Critical Care   Patient Details:    [name] is an 82 y.o. female.  Lines/tubes : External Urinary Catheter (Active)  Collection Container Dedicated Suction Canister 02/24/23 2000  Suction (Verified suction is between 40-80 mmHg) Yes 02/24/23 2000  Securement Method None needed 02/24/23 2000  Site Assessment Clean, Dry, Intact 02/24/23 2000    Microbiology/Sepsis markers: Results for orders placed or performed during the hospital encounter of 02/05/22  Resp Panel by RT-PCR (Flu A&B, Covid) Nasopharyngeal Swab     Status: None   Collection Time: 02/05/22  9:45 AM   Specimen: Nasopharyngeal Swab; Nasopharyngeal(NP) swabs in vial transport medium  Result Value Ref Range Status   SARS Coronavirus 2 by RT PCR NEGATIVE NEGATIVE Final    Comment: (NOTE) SARS-CoV-2 target nucleic acids are NOT DETECTED.  The SARS-CoV-2 RNA is generally detectable in upper respiratory specimens during the acute phase of infection. The lowest concentration of SARS-CoV-2 viral copies this assay can detect is 138 copies/mL. A negative result does not preclude SARS-Cov-2 infection and should not be used as the sole basis for treatment or other patient management decisions. A negative result may occur with  improper specimen collection/handling, submission of specimen other than nasopharyngeal swab, presence of viral mutation(s) within the areas targeted by this assay, and inadequate number of viral copies(<138 copies/mL). A negative result must be combined with clinical observations, patient history, and epidemiological information. The expected result is Negative.  Fact Sheet for Patients:  EntrepreneurPulse.com.au  Fact Sheet for Healthcare Providers:  IncredibleEmployment.be  This test is no t yet approved or cleared by the [place] FDA and   has been authorized for detection and/or diagnosis of SARS-CoV-2 by FDA under an Emergency Use Authorization (EUA). This EUA will remain  in effect (meaning this test can be used) for the duration of the COVID-19 declaration under Section 564(b)(1) of the Act, 21 U.S.C.section 360bbb-3(b)(1), unless the authorization is terminated  or revoked sooner.       Influenza A by PCR NEGATIVE NEGATIVE Final   Influenza B by PCR NEGATIVE NEGATIVE Final    Comment: (NOTE) The Xpert Xpress SARS-CoV-2/FLU/RSV plus assay is intended as an aid in the diagnosis of influenza from Nasopharyngeal swab specimens and should not be used as a sole basis for treatment. Nasal washings and aspirates are unacceptable for Xpert Xpress SARS-CoV-2/FLU/RSV testing.  Fact Sheet for Patients: EntrepreneurPulse.com.au  Fact Sheet for Healthcare Providers: IncredibleEmployment.be  This test is not yet approved or cleared by the [place] FDA and has been authorized for detection and/or diagnosis of SARS-CoV-2 by FDA under an Emergency Use Authorization (EUA). This EUA will remain in effect (meaning this test can be used) for the duration of the COVID-19 declaration under Section 564(b)(1) of the Act, 21 U.S.C. section 360bbb-3(b)(1), unless the authorization is terminated or revoked.  Performed at [facility], [place] [address], [place], [place] [address]   Urine Culture     Status: Abnormal   Collection Time: 02/05/22  3:11 PM   Specimen: Urine, Clean Catch  Result Value Ref Range Status   Specimen Description URINE, CLEAN CATCH  Final   Special Requests   Final    NONE Performed at [facility], [place] [address], [place], [place] [address]    Culture MULTIPLE SPECIES PRESENT, SUGGEST RECOLLECTION (A)  Final   Report Status 02/07/2022  FINAL  Final    Anti-infectives:  Anti-infectives (From admission, onward)    None     Consults: Treatment Team:   Altamese Foster, MD    Studies:    Events:  Subjective:    Overnight Issues:  stable Objective:  Vital signs for last 24 hours: Temp:  [98 F (36.7 C)-98.1 F (36.7 C)] 98.1 F (36.7 C) (03/02 0400) Pulse Rate:  [42-80] 76 (03/02 0900) Resp:  [14-27] 19 (03/02 0900) BP: (93-128)/(42-85) 113/73 (03/02 0900) SpO2:  [92 %-97 %] 97 % (03/02 0900)  Hemodynamic parameters for last 24 hours:    Intake/Output from previous day: 03/01 0701 - 03/02 0700 In: -  Out: 550 [Urine:550]  Intake/Output this shift: Total I/O In: 240 [P.O.:240] Out: 215 [Urine:215]  Vent settings for last 24 hours:    Physical Exam:  General: no respiratory distress Neuro: arouses and F/C, MAE HEENT/Neck: no JVD Resp: clear to auscultation bilaterally CVS: RRR brady GI: soft, NT Extremities: calves soft  Results for orders placed or performed during the hospital encounter of 02/24/23 (from the past 24 hour(s))  Protime-INR     Status: Abnormal   Collection Time: 02/25/23 11:04 AM  Result Value Ref Range   Prothrombin Time 23.5 (H) 11.4 - 15.2 seconds   INR 2.1 (H) 0.8 - 1.2  CBC     Status: Abnormal   Collection Time: 02/26/23  5:57 AM  Result Value Ref Range   WBC 4.9 4.0 - 10.5 K/uL   RBC 2.49 (L) 3.87 - 5.11 MIL/uL   Hemoglobin 8.4 (L) 12.0 - 15.0 g/dL   HCT 25.6 (L) 36.0 - 46.0 %   MCV 102.8 (H) 80.0 - 100.0 fL   MCH 33.7 26.0 - 34.0 pg   MCHC 32.8 30.0 - 36.0 g/dL   RDW 13.5 11.5 - 15.5 %   Platelets 149 (L) 150 - 400 K/uL   nRBC 0.0 0.0 - 0.2 %  Basic metabolic panel     Status: Abnormal   Collection Time: 02/26/23  5:57 AM  Result Value Ref Range   Sodium 135 135 - 145 mmol/L   Potassium 3.7 3.5 - 5.1 mmol/L   Chloride 100 98 - 111 mmol/L   CO2 28 22 - 32 mmol/L   Glucose, Bld 86 70 - 99 mg/dL   BUN 28 (H) 8 - 23 mg/dL   Creatinine, Ser 2.04 (H) 0.44 - 1.00 mg/dL   Calcium 8.7 (L) 8.9 - 10.3 mg/dL   GFR, Estimated 24 (L) >60 mL/min   Anion gap 7 5 - 15  Protime-INR      Status: Abnormal   Collection Time: 02/26/23  5:57 AM  Result Value Ref Range   Prothrombin Time 22.4 (H) 11.4 - 15.2 seconds   INR 2.0 (H) 0.8 - 1.2    Assessment & Plan: Present on Admission:  Fall    LOS: 2 days   Additional comments:I reviewed the patient's new clinical lab test results.   Fall on coumadin TBI/R SDH/Contusion of the R frontal lobe + vertex - NSGY consult. Dr. Ellene Route. No acute intervention. No need to reverse anticoagulation.  He is ok with what cardiology needs to do in regards to her anticoagulation. Repeat CTH stable 3/1. Keppra for seizure ppx. Dr. Grandville Silos d/w Dr. Ellene Route - OK to continue coumadin. ICU for frequent neuro checks. TBI team therapies.  L clavicle fx - NWB per ortho, sling C-Spine - CT negative but midline ttp. Flex ex also neg - collar  D/Cd Scalp laceration - s/p repair 2/29 Hx PAF Hx AVR on Coumadin - continue coumadin per pharmacy Hx BPV  Hx PSP - followed by Dr. Carles Collet with neurology.  Patient has significant fall history and is unable to mobilize on her own.  She requires assistive device and support of husband at ALL times. HX CKD2 - creatinine at baseline; adequate uop Hx HTN Hx HLD FEN - diet VTE - SCDs, resume coumadin ID - None Foley - None, required I&O, if persists will need foley/flomax Dispo - ICU --> 4NP (pending), TBI team therapies In light of her chronic mobility issues will likely need some form of rehab. Critical Care Total Time*: 30 Minutes  Check [email or web address] for General Surgery coverage night/weekend/holidays  Page if acute issues. No secure chat available for me given surgeries/clinic/off post call which would lead to a delay in care.  Nadeen Landau, MD [facility], [facility]  02/26/2023  *Care during the described time interval was provided by me. I have reviewed this patient's available data, including medical history, events of note, physical examination and test results as part  of my evaluation.

## 2023-02-28 LAB — BASIC METABOLIC PANEL
Anion gap: 8 (ref 5–15)
BUN: 26 mg/dL — ABNORMAL HIGH (ref 8–23)
CO2: 28 mmol/L (ref 22–32)
Calcium: 8.9 mg/dL (ref 8.9–10.3)
Chloride: 103 mmol/L (ref 98–111)
Creatinine, Ser: 2 mg/dL — ABNORMAL HIGH (ref 0.44–1.00)
GFR, Estimated: 24 mL/min — ABNORMAL LOW (ref >60)
Glucose, Bld: 79 mg/dL (ref 70–99)
Potassium: 3.9 mmol/L (ref 3.5–5.1)
Sodium: 139 mmol/L (ref 135–145)

## 2023-02-28 LAB — CBC
HCT: 29 % — ABNORMAL LOW (ref 36.0–46.0)
Hemoglobin: 9.2 g/dL — ABNORMAL LOW (ref 12.0–15.0)
MCH: 33 pg (ref 26.0–34.0)
MCHC: 31.7 g/dL (ref 30.0–36.0)
MCV: 103.9 fL — ABNORMAL HIGH (ref 80.0–100.0)
Platelets: 170 10*3/uL (ref 150–400)
RBC: 2.79 MIL/uL — ABNORMAL LOW (ref 3.87–5.11)
RDW: 13.5 % (ref 11.5–15.5)
WBC: 5.4 10*3/uL (ref 4.0–10.5)
nRBC: 0 % (ref 0.0–0.2)

## 2023-02-28 LAB — PROTIME-INR
INR: 2.9 — ABNORMAL HIGH (ref 0.8–1.2)
Prothrombin Time: 30.2 seconds — ABNORMAL HIGH (ref 11.4–15.2)

## 2023-02-28 MED ORDER — MORPHINE SULFATE (PF) 2 MG/ML IV SOLN: 2.0000 mg | INTRAVENOUS | Status: DC | PRN

## 2023-02-28 MED ORDER — WARFARIN SODIUM 1 MG PO TABS
1.0000 mg | ORAL_TABLET | Freq: Once | ORAL | Status: AC
  Administered 2023-02-28: 1 mg via ORAL
  Filled 2023-02-28: qty 1

## 2023-02-28 NOTE — Progress Notes (Signed)
Physical Therapy Treatment Patient Details Name: [name] MRN: [id number] DOB: Jun 27, 1941 Today's Date: 02/28/2023   History of Present Illness The pt is an 82 yo female presenting 2/29 after falling while getting out of her car. Imaging showed TBI/R SDH/contusion of R frontal lobe, and L clavicle fx. PMH includes: supranuclear palsy, BPV, HTN, GERD, CKD II, afib, HLD, mechanical aortic valve replacement on Coumadin therapy, and previous breast cancer.    PT Comments    The pt was agreeable to session with focus on progressing OOB mobility and gait training. The spouse had originally asked for session on bed mobility to improve his confidence, but he asked to focus on gait training this morning to ensure the pt would be able to ambulate household distances. The pt continues to require modA to rise to standing and steady with standing and gait, even with use of SPC in RUE. The pt ambulates with narrow BOS, scissoring steps, and multiple LOB which the spouse reports is close to her baseline. Will continue to work on dynamic stability and pt independence with bed mobility, transfers, and household distances to ensure safety when mobilizing with spouse after d/c.     Recommendations for follow up therapy are one component of a multi-disciplinary discharge planning process, led by the attending physician.  Recommendations may be updated based on patient status, additional functional criteria and insurance authorization.  Follow Up Recommendations  Home health PT     Assistance Recommended at Discharge Frequent or constant Supervision/Assistance  Patient can return home with the following A lot of help with walking and/or transfers;A lot of help with bathing/dressing/bathroom;Assistance with cooking/housework;Direct supervision/assist for medications management;Direct supervision/assist for financial management;Assist for transportation;Help with stairs or ramp for entrance   Equipment  Recommendations  None recommended by PT    Recommendations for Other Services       Precautions / Restrictions Precautions Precautions: Fall Precaution Comments: pt is a high fall risk Restrictions Weight Bearing Restrictions: Yes LUE Weight Bearing: Non weight bearing Other Position/Activity Restrictions: NWB in sling     Mobility  Bed Mobility Overal bed mobility: Needs Assistance Bed Mobility: Sit to Supine       Sit to supine: Min assist   General bed mobility comments: max cues, minA to bring LE into bed and facilitate roll, cues and monitoring use of LUE to push for rolling    Transfers Overall transfer level: Needs assistance Equipment used: 1 person hand held assist Transfers: Sit to/from Stand Sit to Stand: Mod assist           General transfer comment: modA to rise to standing with cues for use of UE and assist to steady as pt with posterior lean    Ambulation/Gait Ambulation/Gait assistance: Mod assist, +2 safety/equipment (chair follow) Gait Distance (Feet): 30 Feet Assistive device: Straight cane Gait Pattern/deviations: Step-to pattern, Decreased stride length, Shuffle, Scissoring, Narrow base of support Gait velocity: slow but close to baseline Gait velocity interpretation: <1.31 ft/sec, indicative of household ambulator   General Gait Details: pt with narrow BOS and small steps, multiple episodes of scissoring and need for modA to steady and correct LOB    Modified Rankin (Stroke Patients Only) Modified Rankin (Stroke Patients Only) Pre-Morbid Rankin Score: Moderately severe disability Modified Rankin: Moderately severe disability     Balance Overall balance assessment: Needs assistance Sitting-balance support: No upper extremity supported, Feet supported Sitting balance-Leahy Scale: Fair   Postural control: Posterior lean Standing balance support: Single extremity supported Standing balance-Leahy  Scale: Poor                               Cognition Arousal/Alertness: Awake/alert Behavior During Therapy: Flat affect Overall Cognitive Status: Impaired/Different from baseline Area of Impairment: Safety/judgement, Awareness, Problem solving                         Safety/Judgement: Decreased awareness of deficits Awareness: Intellectual Problem Solving: Decreased initiation, Requires verbal cues General Comments: pt more flat today, maintained eyes closed until directly cued. remained able to follwo simple cues, but less engagement in session conversationally.           General Comments General comments (skin integrity, edema, etc.): VSS on RA      Pertinent Vitals/Pain Pain Assessment Pain Assessment: Faces Faces Pain Scale: No hurt Pain Intervention(s): Monitored during session, Repositioned     PT Goals (current goals can now be found in the care plan section) Acute Rehab PT Goals Patient Stated Goal: return home PT Goal Formulation: With patient/family Time For Goal Achievement: 03/11/23 Potential to Achieve Goals: Good Progress towards PT goals: Progressing toward goals    Frequency    Min 4X/week      PT Plan Current plan remains appropriate       AM-PAC PT "6 Clicks" Mobility   Outcome Measure  Help needed turning from your back to your side while in a flat bed without using bedrails?: A Little Help needed moving from lying on your back to sitting on the side of a flat bed without using bedrails?: A Lot Help needed moving to and from a bed to a chair (including a wheelchair)?: A Lot Help needed standing up from a chair using your arms (e.g., wheelchair or bedside chair)?: A Lot Help needed to walk in hospital room?: A Lot Help needed climbing 3-5 steps with a railing? : Total 6 Click Score: 12    End of Session Equipment Utilized During Treatment: Gait belt Activity Tolerance: Patient tolerated treatment well Patient left: with call bell/phone within reach;with  family/visitor present;in bed;with bed alarm set Nurse Communication: Mobility status PT Visit Diagnosis: Other abnormalities of gait and mobility (R26.89);Muscle weakness (generalized) (M62.81);Repeated falls (R29.6);History of falling (Z91.81)     Time: 1001-1035 PT Time Calculation (min) (ACUTE ONLY): 34 min  Charges:  $Gait Training: 23-37 mins                     West Carbo, PT, DPT   Acute Rehabilitation Department Office [phone] Secure Chat Communication Preferred   Sandra Cockayne 02/28/2023, 10:58 AM

## 2023-02-28 NOTE — Care Management Important Message (Signed)
Important Message  Patient Details  Name: [name] MRN: [id number] Date of Birth: 1941-12-07   Medicare Important Message Given:  Yes     Hannah Beat 02/28/2023, 12:00 PM

## 2023-02-28 NOTE — Progress Notes (Signed)
Pt had not voided since removal of pt's foley around 11 am this morning and pt was due to void. Pt attempt to void around 1600 but had difficulty starting stream, RN and pt decided to attempt later and around 1800 pt was bladder scanned for only 120 ml. Pt was assisted to the [facility] and she voided a little which measured to be 100 ml. RN In & Out catheterize pt assuming the bladder scanner was wrong and pt also voicing she felt her bladder was still full. Pt only hand 50 ml from the In and out catheterization with clear, yellow urine. Peri care done and pt informed she will be bladder scan after voiding to see if she's emptying. Report given to oncoming RN. Pt resting comfortably in bed with daughter at bedside and call light within reach. Delia Heady RN

## 2023-02-28 NOTE — Progress Notes (Signed)
Progress Note     Subjective: Pt reports pain overall well controlled. Tolerating diet and having bowel function. Husband at bedside and confirms he will be 24/7 support for patient - looking forward to seeing how she does with PT/OT so they can assess if he will really be able to help her at home. They have a daughter that lives close by but works so can't be there 24/7.   Objective: Vital signs in last 24 hours: Temp:  [97.5 F (36.4 C)-98.5 F (36.9 C)] 97.9 F (36.6 C) (03/04 0750) Pulse Rate:  [44-72] 45 (03/04 0750) Resp:  [15-23] 15 (03/04 0750) BP: (120-146)/(49-68) 146/63 (03/04 0750) SpO2:  [93 %-100 %] 96 % (03/04 0750) Last BM Date : 02/27/23  Intake/Output from previous day: 03/03 0701 - 03/04 0700 In: 120 [P.O.:120] Out: 1100 [Urine:1100] Intake/Output this shift: Total I/O In: -  Out: 750 [Urine:750]  PE: General: pleasant, WD, elderly female who is sitting up in the chair HEENT: facial ecchymosis with small hematoma to right temple with sutures present, EOMI Heart: sinus bradycardia in the 50s. Palpable radial and pedal pulses bilaterally Lungs: CTAB, no wheezes, rhonchi, or rales noted.  Respiratory effort nonlabored Abd: soft, NT, ND MS: LUE in sling, L hand WWP Neuro: non focal exam  Psych: A&Ox4 with an appropriate affect.    Lab Results:  Recent Labs    02/27/23 0707 02/28/23 0549  WBC 5.3 5.4  HGB 8.6* 9.2*  HCT 26.5* 29.0*  PLT 149* 170   BMET Recent Labs    02/27/23 0707 02/28/23 0549  NA 138 139  K 3.9 3.9  CL 104 103  CO2 28 28  GLUCOSE 82 79  BUN 28* 26*  CREATININE 2.15* 2.00*  CALCIUM 8.7* 8.9   PT/INR Recent Labs    02/27/23 0707 02/28/23 0549  LABPROT 28.5* 30.2*  INR 2.7* 2.9*   CMP     Component Value Date/Time   NA 139 02/28/2023 0549   NA 141 08/10/2022 0917   K 3.9 02/28/2023 0549   CL 103 02/28/2023 0549   CO2 28 02/28/2023 0549   GLUCOSE 79 02/28/2023 0549   BUN 26 (H) 02/28/2023 0549   BUN 29  (H) 08/10/2022 0917   CREATININE 2.00 (H) 02/28/2023 0549   CALCIUM 8.9 02/28/2023 0549   PROT 4.7 (L) 02/25/2023 0650   PROT 6.1 08/10/2022 0917   ALBUMIN 2.4 (L) 02/25/2023 0650   ALBUMIN 3.6 (L) 08/10/2022 0917   AST 20 02/25/2023 0650   ALT 16 02/25/2023 0650   ALKPHOS 46 02/25/2023 0650   BILITOT 0.5 02/25/2023 0650   BILITOT 0.3 08/10/2022 0917   GFRNONAA 24 (L) 02/28/2023 0549   GFRAA 32 (L) 07/01/2020 0837   Lipase     Component Value Date/Time   LIPASE 27 02/05/2022 0629       Studies/Results: No results found.  Anti-infectives: Anti-infectives (From admission, onward)    None        Assessment/Plan  Fall on coumadin TBI/R SDH/Contusion of the R frontal lobe + vertex - NSGY consult. Dr. Ellene Route. No acute intervention. No need to reverse anticoagulation.  He is ok with what cardiology needs to do in regards to her anticoagulation. Repeat CTH stable 3/1. Keppra for seizure ppx. Dr. Grandville Silos d/w Dr. Ellene Route - OK to continue coumadin. TBI team therapies.  L clavicle fx - NWB per ortho, sling, f/u with Dr. Marcelino Scot 2 weeks  Scalp laceration - s/p repair 2/29, remove sutures prior to  DC Hx PAF Hx AVR on Coumadin - continue coumadin per pharmacy Hx BPV  Hx PSP - followed by Dr. Carles Collet with neurology.  Patient has significant fall history and is unable to mobilize on her own.  She requires assistive device and support of husband at ALL times. Per report they were weaning carbidopa-levodopa at home, husband brought record of this will have pharmacy review HX CKD2 - creatinine at baseline; adequate uop Hx HTN Hx HLD  FEN - reg diet, SLIV VTE - SCDs, Coumadin - INR 2.9 ID - None Foley - foley was placed 3/1, remove for TOV today   Dispo - 4NP. Continue TBI therapies. Current recs for [facility] but husband will need to make sure he is able to help her by himself with current restrictions   LOS: 4 days      Norm Parcel, [facility] 02/28/2023, 10:56  AM Please see Amion for pager number during day hours 7:00am-4:30pm

## 2023-02-28 NOTE — Progress Notes (Signed)
ANTICOAGULATION CONSULT NOTE - Initial Consult  Pharmacy Consult for warfarin  Indication: atrial fibrillation and mechanical aortic valve   Allergies  Allergen Reactions   Demerol [Meperidine] Shortness Of Breath and Swelling   Oxycodone Other (See Comments)    Hallucinations    Adhesive [Tape] Other (See Comments)    Redness and skin tears   Alendronate     Other reaction(s): heartburn   Ivp Dye [Iodinated Contrast Media] Hives    OK with benadryl pre-med ('50mg'$  one hour before receiving iodinated contrast agent)   Losartan     Other reaction(s): dizzy   Phenergan [Promethazine] Other (See Comments)    Avoids due to reaction with current medications    Patient Measurements: Height: 5' (152.4 cm) Weight: 54 kg (119 lb 0.8 oz) IBW/kg (Calculated) : 45.5  Vital Signs: Temp: 97.9 F (36.6 C) (03/04 0750) Temp Source: Oral (03/04 0750) BP: 146/63 (03/04 0750) Pulse Rate: 45 (03/04 0750)  Labs: Recent Labs    02/26/23 0557 02/27/23 0707 02/28/23 0549  HGB 8.4* 8.6* 9.2*  HCT 25.6* 26.5* 29.0*  PLT 149* 149* 170  LABPROT 22.4* 28.5* 30.2*  INR 2.0* 2.7* 2.9*  CREATININE 2.04* 2.15* 2.00*     Estimated Creatinine Clearance: 15.6 mL/min (A) (by C-G formula based on SCr of 2 mg/dL (H)).   Medical History: Past Medical History:  Diagnosis Date   BPV (benign positional vertigo)    Chronic neck pain    C2-3 arthropathy   CKD (chronic kidney disease), stage II    Diverticulosis of colon    GERD (gastroesophageal reflux disease)    Hiatal hernia    History of breast cancer per pt no recurrence   dx 2004-- Left breast DCIS (ER/PR+, HER2 negative) s/p  partial masectomy w/ sln bx and  chemoradiotion (completed 2004)   History of herpes zoster    History of kidney stones    HTN (hypertension)    Hyperlipidemia    Irritable bowel syndrome    LBBB (left bundle branch block)    PAF (paroxysmal atrial fibrillation) (HCC)    a. CHA2DS2VASc = 4-->coumadin.    Parkinsonism    neurologist-  dr tat   PONV (postoperative nausea and vomiting)    S/P aortic valve replacement with prosthetic valve    a. 04/13/2006 s/p St Jude mechnical AVR for severe AS;  b. 09/2014 Echo: EF 40-45%, gr1 DD, mild AI/MR, triv TR/PR.   Squamous cell carcinoma of skin 04/06/2022   right nasal sidewall   SUI (stress urinary incontinence, female)    Symptomatic anemia    Venous varices      Assessment: Patient admitted with SDH. On warfarin PTA, regimen is 2.'5mg'$  all days except '5mg'$  on Thursdays. Confirmed with husband at bedside. Neurosurgery okay with resuming anticoagulation.   CBC stable.   2/29: INR 2.4; missed warfarin dose.  3/1: INR 2.1; warfarin '5mg'$  x1 -patient's home simvastatin was restarted on 3/1 (interaction w/ warfarin that increases anticoagulant effect) 3/2: INR 2.0; warfarin '5mg'$  x1 3/3: INR 2.7; warfarin '1mg'$  x1 3/4: INR 2.9   Goal of Therapy:  INR 2.5-3.5 (confirmed w/ husband at bedside)    Plan:  Warfarin 1 mg PO x1 again this evening given INR trend Daily INR.   Wilson Singer, PharmD Clinical Pharmacist 02/28/2023 11:16 AM

## 2023-02-28 NOTE — TOC Initial Note (Addendum)
Transition of Care [facility]) - Initial/Assessment Note    Patient Details  Name: [name] MRN: [id number] Date of Birth: 1941/11/17  Transition of Care [facility]) CM/SW Contact:    Ella Bodo, RN Phone Number: 02/28/2023, 10:20am  Clinical Narrative:                 The pt is an 82 yo female presenting 2/29 after falling while getting out of her car. Imaging showed TBI/R SDH/contusion of R frontal lobe, and L clavicle fx. PMH includes: supranuclear palsy, BPV, HTN, GERD, CKD II, afib, HLD, mechanical aortic valve replacement on Coumadin therapy, and previous breast cancer.  PTA, pt requires assistance with ADLs; she lives with her spouse, and daughter lives nearby.  Met with pt/husb to discuss dc planning; patient very sleepy.  Husband states patient has had several falls in the last year; she uses a rollator and spouse provides hands-on assistance with ambulation.  PT/OT recommending [place] follow up, which husband prefers.  He states patient has used HH in the past, but asks that we use HH that is contracted with her insurance.  Will follow progress with therapies; hopeful for improvement prior to dc.   Patient's PCP is Dr. Philip Aspen with [facility].  Expected Discharge Plan: [place] Barriers to Discharge: Continued Medical Work up   Patient Goals and CMS Choice   CMS Medicare.gov Compare Post Acute Care list provided to:: Patient Represenative (must comment) (husband)        Expected Discharge Plan and Services   Discharge Planning Services: CM Consult Post Acute Care Choice: [place] arrangements for the past 2 months: Single Family Home                                      Prior Living Arrangements/Services Living arrangements for the past 2 months: Single Family Home Lives with:: Spouse Patient language and need for interpreter reviewed:: Yes Do you feel safe going back to the place where you live?: Yes      Need for Family  Participation in Patient Care: Yes (Comment) Care giver support system in place?: Yes (comment) Current home services: DME Criminal Activity/Legal Involvement Pertinent to Current Situation/Hospitalization: No - Comment as needed     Permission Sought/Granted   Permission granted to share information with : Yes, Verbal Permission Granted  Share Information with NAME: Nandy Paull     Permission granted to share info w Relationship: Husband  Permission granted to share info w Contact Information: [phone]  Emotional Assessment Appearance:: Appears stated age Attitude/Demeanor/Rapport: Sedated          Admission diagnosis:  Fall [W19.XXXA] Subdural bleeding ([place]) [I62.00] Traumatic hematoma of forehead, initial encounter [S00.83XA] Fall, initial encounter [W19.XXXA] Laceration of forehead, initial encounter [S01.81XA] Patient Active Problem List   Diagnosis Date Noted   Fall 02/24/2023   AVD (aortic valve disease) 02/24/2023   Pressure injury of skin 02/08/2022   Rectus sheath hematoma, initial encounter 02/05/2022   Paroxysmal atrial fibrillation ([place]) 04/09/2021   Chronic combined systolic and diastolic heart failure ([place]) 07/17/2020   PSP (progressive supranuclear palsy) ([place]) 02/29/2020   Atrial flutter (HCC)    Heme positive stool    Symptomatic anemia 12/05/2018   GI bleed 12/05/2018   CKD (chronic kidney disease), stage III ([place]) 12/05/2018   Elevated d-dimer 12/05/2018   SOB (shortness of breath) 12/05/2018   Supratherapeutic  INR 12/05/2018   Parkinsonism    Elevated troponin 09/19/2016   Emesis 09/19/2016   Sinus bradycardia 09/16/2016   Family history of colon cancer 07/30/2014   Hiatal hernia 07/30/2014   Chest pain 10/13/2011   Disequilibrium 10/13/2011   Heart valve replaced by other means 04/16/2011   Chronic anticoagulation 03/17/2011   S/P AVR (aortic valve replacement)    PVC's (premature ventricular contractions)    HTN (hypertension)     Hypercholesteremia    Atrial fibrillation with RVR ([place])    GERD (gastroesophageal reflux disease)    GERD 02/06/2010   DIVERTICULOSIS-COLON 02/06/2010   PERSONAL HX COLONIC POLYPS 02/06/2010   MASTECTOMY, HX OF 02/06/2010   PCP:  Donnajean Lopes, MD Pharmacy:   [facility] ([place] [place] [id number] [place] [address] Phone: [phone] Fax: [phone]     Social Determinants of Health (SDOH) Social History: SDOH Screenings   Tobacco Use: Low Risk  (01/26/2023)   SDOH Interventions:     Readmission Risk Interventions    02/11/2022   10:27 AM  Readmission Risk Prevention Plan  Transportation Screening Complete  PCP or Specialist Appt within 3-5 Days Complete  HRI or [place] Complete  Social Work Consult for [place] Planning/Counseling Complete  Palliative Care Screening Not Applicable  Medication Review (RN Care Manager) Complete   Reinaldo Raddle, RN, BSN  Trauma/Neuro ICU Case Manager [phone]

## 2023-03-01 LAB — BASIC METABOLIC PANEL
Anion gap: 9 (ref 5–15)
BUN: 31 mg/dL — ABNORMAL HIGH (ref 8–23)
CO2: 25 mmol/L (ref 22–32)
Calcium: 8.6 mg/dL — ABNORMAL LOW (ref 8.9–10.3)
Chloride: 102 mmol/L (ref 98–111)
Creatinine, Ser: 1.92 mg/dL — ABNORMAL HIGH (ref 0.44–1.00)
GFR, Estimated: 26 mL/min — ABNORMAL LOW (ref >60)
Glucose, Bld: 84 mg/dL (ref 70–99)
Potassium: 3.9 mmol/L (ref 3.5–5.1)
Sodium: 136 mmol/L (ref 135–145)

## 2023-03-01 LAB — PROTIME-INR
INR: 2.8 — ABNORMAL HIGH (ref 0.8–1.2)
Prothrombin Time: 29.4 seconds — ABNORMAL HIGH (ref 11.4–15.2)

## 2023-03-01 MED ORDER — WARFARIN SODIUM 2.5 MG PO TABS
2.5000 mg | ORAL_TABLET | Freq: Once | ORAL | Status: AC
  Administered 2023-03-01: 2.5 mg via ORAL
  Filled 2023-03-01: qty 1

## 2023-03-01 NOTE — Progress Notes (Signed)
ANTICOAGULATION CONSULT NOTE - Initial Consult  Pharmacy Consult for warfarin  Indication: atrial fibrillation and mechanical aortic valve   Allergies  Allergen Reactions   Demerol [Meperidine] Shortness Of Breath and Swelling   Oxycontin [Oxycodone] Other (See Comments)    Hallucinations    Adhesive [Tape] Other (See Comments)    Redness and skin tears   Cozaar [Losartan] Other (See Comments)    Dizziness    Fosamax [Alendronate] Other (See Comments)    Heartburn    Ivp Dye [Iodinated Contrast Media] Hives    OK with benadryl pre-med ('50mg'$  one hour before receiving iodinated contrast agent)   Phenergan [Promethazine] Other (See Comments)    Avoids due to reaction with current medications    Patient Measurements: Height: 5' (152.4 cm) Weight: 54 kg (119 lb 0.8 oz) IBW/kg (Calculated) : 45.5  Vital Signs: Temp: 97.8 F (36.6 C) (03/05 0300) Temp Source: Oral (03/05 0300) BP: 134/53 (03/05 0300) Pulse Rate: 47 (03/05 0300)  Labs: Recent Labs    02/27/23 0707 02/28/23 0549 03/01/23 0451  HGB 8.6* 9.2*  --   HCT 26.5* 29.0*  --   PLT 149* 170  --   LABPROT 28.5* 30.2* 29.4*  INR 2.7* 2.9* 2.8*  CREATININE 2.15* 2.00*  --      Estimated Creatinine Clearance: 15.6 mL/min (A) (by C-G formula based on SCr of 2 mg/dL (H)).   Medical History: Past Medical History:  Diagnosis Date   BPV (benign positional vertigo)    Chronic neck pain    C2-3 arthropathy   CKD (chronic kidney disease), stage II    Diverticulosis of colon    GERD (gastroesophageal reflux disease)    Hiatal hernia    History of breast cancer per pt no recurrence   dx 2004-- Left breast DCIS (ER/PR+, HER2 negative) s/p  partial masectomy w/ sln bx and  chemoradiotion (completed 2004)   History of herpes zoster    History of kidney stones    HTN (hypertension)    Hyperlipidemia    Irritable bowel syndrome    LBBB (left bundle branch block)    PAF (paroxysmal atrial fibrillation) (HCC)    a.  CHA2DS2VASc = 4-->coumadin.   Parkinsonism    neurologist-  dr tat   PONV (postoperative nausea and vomiting)    S/P aortic valve replacement with prosthetic valve    a. 04/13/2006 s/p St Jude mechnical AVR for severe AS;  b. 09/2014 Echo: EF 40-45%, gr1 DD, mild AI/MR, triv TR/PR.   Squamous cell carcinoma of skin 04/06/2022   right nasal sidewall   SUI (stress urinary incontinence, female)    Symptomatic anemia    Venous varices      Assessment: Patient admitted with SDH. On warfarin PTA, regimen is 2.'5mg'$  all days except '5mg'$  on Thursdays. Confirmed with husband at bedside. Neurosurgery okay with resuming anticoagulation.   CBC stable 3/4.   2/29: INR 2.4; missed warfarin dose.  3/1: INR 2.1; warfarin '5mg'$  x1 -patient's home simvastatin was restarted on 3/1 (interaction w/ warfarin that increases anticoagulant effect) 3/2: INR 2.0; warfarin '5mg'$  x1 3/3: INR 2.7; warfarin '1mg'$  x1 3/4: INR 2.9; warfarin '1mg'$  x1 3/5: INR 2.8;   Goal of Therapy:  INR 2.5-3.5 (confirmed w/ husband at bedside)    Plan:  Warfarin 2.'5mg'$  PO x1 today @ 1600 (as PTA) Daily INR.   Wilson Singer, PharmD Clinical Pharmacist 03/01/2023 7:54 AM

## 2023-03-01 NOTE — Progress Notes (Signed)
Physical Therapy Treatment Patient Details Name: [name] MRN: [id number] DOB: Apr 29, 1941 Today's Date: 03/01/2023   History of Present Illness The pt is an 82 yo female presenting 2/29 after falling while getting out of her car. Imaging showed TBI/R SDH/contusion of R frontal lobe, and L clavicle fx. PMH includes: supranuclear palsy, BPV, HTN, GERD, CKD II, afib, HLD, mechanical aortic valve replacement on Coumadin therapy, and previous breast cancer.    PT Comments    Spent extensive time with spouse and working with him on properly assisting pt with gait, transfers, and bed mobility. Pt ambulates the safest with R HHA with supportive hand around back onto L lateral hip. Spouse reports this to be the most comfortable with him as well. Worked extensively on log rolling/sidelying technique to the R for bed mobility and then face to face step pvt transfers to/from chair to mimic BSC at home along side of bed. Pt's spouse reports "I'm getting more comfortable but need more practice." Trauma, PA came in mid session and we agreed the best plan would be for PT to see patient tomorrow morning at 7:30am with spouse and dtr present to go over how to assist pt with transfers, ambulation, and bed mobility so dtr can see as well to assist pt at home. Will ask OT to follow PT tomorrow to help assist pt and family with dressing pt for d/c home.     Recommendations for follow up therapy are one component of a multi-disciplinary discharge planning process, led by the attending physician.  Recommendations may be updated based on patient status, additional functional criteria and insurance authorization.  Follow Up Recommendations  Home health PT     Assistance Recommended at Discharge Frequent or constant Supervision/Assistance  Patient can return home with the following A lot of help with walking and/or transfers;A lot of help with bathing/dressing/bathroom;Assistance with cooking/housework;Direct  supervision/assist for medications management;Direct supervision/assist for financial management;Assist for transportation;Help with stairs or ramp for entrance   Equipment Recommendations  None recommended by PT    Recommendations for Other Services       Precautions / Restrictions Precautions Precautions: Fall Precaution Comments: pt is a high fall risk Restrictions Weight Bearing Restrictions: Yes LUE Weight Bearing: Non weight bearing Other Position/Activity Restrictions: NWB in sling     Mobility  Bed Mobility Overal bed mobility: Needs Assistance Bed Mobility: Rolling, Sidelying to Sit, Sit to Sidelying Rolling: Min assist Sidelying to sit: Min assist     Sit to sidelying: Min assist General bed mobility comments: worked extensively with spouse on assisting pt with getting in/out of bed via log rolling to the R and going down on R elbow when getting into bed. Spouse reports pt has been crawling into the bed from the end of the bed all the way up to the top but now she can't because L UE NWB. Spouse returned demonstrated assisting pt with above technique with max verbal cues from PT    Transfers Overall transfer level: Needs assistance Equipment used: 1 person hand held assist Transfers: Sit to/from Stand Sit to Stand: Mod assist   Step pivot transfers: Mod assist       General transfer comment: modA to rise to standing with cues for use of UE and assist to steady as pt with posterior lean. worked extensively with spouse on proper hand placement on pt's bilat posterior hips with the gait belt and having pt holding onto spouses L UE with her R UE, keeping his R  foot in between her feet when doing a face to face transfer to the patients R and then his L foot between her two feet when transferring to the patient's L. Pt returned demonstrated step pvt transfer from EOB to chair x 2 with amx verbal cues, pt continues with posterior bias and needs verbal cues for sequencing  steps    Ambulation/Gait Ambulation/Gait assistance: Mod assist, +2 safety/equipment (chair follow) Gait Distance (Feet): 30 Feet (x2, 50x1, 10x1) Assistive device: Quad cane Gait Pattern/deviations: Step-to pattern, Decreased stride length, Shuffle, Narrow base of support Gait velocity: slow Gait velocity interpretation: <1.31 ft/sec, indicative of household ambulator   General Gait Details: pt with narrow BOS and small steps, no episodes of scissoring this date. Trialed quad cane in R UE and assist from PT on L side via gait belt and had spouse trial it as well however pt unable to use cane properly and spouse reports "she always just carries it. She has never really used it, even before this fall." Trialed HHA on R UE with therapist L hand around pt's back onto lateral L hip with gait belt. Spouse also trialed this technique and reports feeling more stable. Pt with improved step length and height when using the R HHA method vs cane   Stairs             Wheelchair Mobility    Modified Rankin (Stroke Patients Only) Modified Rankin (Stroke Patients Only) Pre-Morbid Rankin Score: Moderately severe disability Modified Rankin: Moderately severe disability     Balance Overall balance assessment: Needs assistance Sitting-balance support: No upper extremity supported, Feet supported Sitting balance-Leahy Scale: Fair   Postural control: Posterior lean Standing balance support: Single extremity supported Standing balance-Leahy Scale: Poor                              Cognition Arousal/Alertness: Awake/alert Behavior During Therapy: Flat affect Overall Cognitive Status: Impaired/Different from baseline Area of Impairment: Safety/judgement, Awareness, Problem solving, Memory, Following commands                     Memory: Decreased short-term memory Following Commands: Follows one step commands with increased time Safety/Judgement: Decreased awareness of  deficits, Decreased awareness of safety Awareness: Intellectual Problem Solving: Decreased initiation, Requires verbal cues, Difficulty sequencing, Slow processing General Comments: pt more flat today, maintained eyes closed unless directly cued.  Pt with poor self awareness to make corrections, reliant on external support, poor initiation requires constant verbal and tactile cues        Exercises      General Comments General comments (skin integrity, edema, etc.): HR 48-50, SpO2 >95% on RA, pt with noted R forehead hematoma and R facial bruising      Pertinent Vitals/Pain Pain Assessment Pain Assessment: Faces Faces Pain Scale: Hurts a little bit Pain Location: R eye Pain Intervention(s): Monitored during session    Home Living                          Prior Function            PT Goals (current goals can now be found in the care plan section) Acute Rehab PT Goals Patient Stated Goal: return home PT Goal Formulation: With patient/family Time For Goal Achievement: 03/11/23 Potential to Achieve Goals: Good Progress towards PT goals: Progressing toward goals    Frequency    Min 4X/week  PT Plan Current plan remains appropriate    Co-evaluation              AM-PAC PT "6 Clicks" Mobility   Outcome Measure  Help needed turning from your back to your side while in a flat bed without using bedrails?: A Little Help needed moving from lying on your back to sitting on the side of a flat bed without using bedrails?: A Lot Help needed moving to and from a bed to a chair (including a wheelchair)?: A Lot Help needed standing up from a chair using your arms (e.g., wheelchair or bedside chair)?: A Lot Help needed to walk in hospital room?: A Lot Help needed climbing 3-5 steps with a railing? : Total 6 Click Score: 12    End of Session Equipment Utilized During Treatment: Gait belt Activity Tolerance: Patient tolerated treatment well Patient left:  with call bell/phone within reach;with family/visitor present;in bed;with bed alarm set Nurse Communication: Mobility status PT Visit Diagnosis: Other abnormalities of gait and mobility (R26.89);Muscle weakness (generalized) (M62.81);Repeated falls (R29.6);History of falling (Z91.81)     Time: [id number] PT Time Calculation (min) (ACUTE ONLY): 53 min  Charges:  $Gait Training: 23-37 mins $Therapeutic Activity: 23-37 mins                     Kittie Plater, PT, DPT Acute Rehabilitation Services Secure chat preferred Office #: [phone]    Berline Lopes 03/01/2023, 10:39 AM

## 2023-03-01 NOTE — Patient Instructions (Addendum)
Occupational Therapy Shoulder Protocol  SLING: - Wear at all times except during exercises and bathing/dressing - until MD instructs otherwise - Put sling on arm first - make sure elbow is down in ?corner pocke of the sling and sling comes to your knuckles; use thumb loop as needed - Proper position is operated arm at 90? of elbow flexion & forearm parallel to floor - Tighten shoulder strap - then waist strap (if using waist strap) - RELAX your shoulder muscles and allow the sling to support the operated arm - When sitting, have forearm supported by pillow and loosen the neck strap to give your neck a break (but keep sling on and strap fastened) - unless instructed otherwise   DRESSING: - Wear garments that button up the front - Put shirt sleeve over operated arm first-be careful not to jerk shoulder, then put non-operated arm into sleeve; Reverse process for undressing (operated arm comes out of shirt last) - Wear sling over shirt. If instructed to not come out of sling, then wear sling under shirt.

## 2023-03-01 NOTE — Discharge Summary (Signed)
Physician Discharge Summary  Patient ID: [name] MRN: [id number] DOB/AGE: 1941-10-14 82 y.o.  Admit date: 02/24/2023 Discharge date: 03/01/2023  Discharge Diagnoses Fall on coumadin Right SDH Contusion of right frontal lobe and vertex Left clavicle fracture Scalp laceration  Urinary retention, resolved  Consultants Neurosurgery  Orthopedic surgery  Cardiology  Procedures Laceration repair - 02/24/23 Dr. Georganna Skeans  HPI: Patient is a 82 y.o. female with hx of progressive supranuclear palsy, BPV, HTN, HLD, CKD2, PAF and mechanical AVR on Coumadin (INR 2.4) who presented to the ED after a fall. Patient was attempting to get out of her car when she fell. At baseline, she has significant fall hx and cannot ambulate without assistive devices/husband's support. Found to have a small laceration to the R forehead with an arterial bleed that was closed with figure of 8 stitch. A large hematoma developed from this, but it did stop bleeding. Complained of L chest wall pain and L shoulder pain. Airway intact. HDS in trauma bay. CXR and pelvic xrays done. CTH w/ R SDH and contusion of the R frontal lobe + vertex contusions. CT C-Spine negative, but with pain in her midline with ROM. L clavicle fx on films. CT CAP pending. Patient admitted to the trauma ICU.  Hospital Course: Neurosurgery consulted and recommended follow up [place] in 12 hrs, ok to continue coumadin in setting of mechanical valve. Orthopedic surgery consulted and recommended non-operative management and NWB to LUE for left clavicle fracture. Cardiology consulted to help with anticoagulation in setting of acute TBI. Patient was evaluated by TBI therapies and recommended CIR. Hospitalization complicated by urinary retention and foley was placed, TOV 3/4 successful after foley removal. Sutures removed from right temple prior to discharge.   On 03/08/23 patient was tolerating a diet, voiding, pain controlled, VSS and overall felt  stable for discharge to CIR with follow up as outlined below.   PE:  General: pleasant, WD, elderly female who is sitting up in the chair HEENT: facial ecchymosis with small hematoma to right temple with sutures present, EOMI Heart: sinus bradycardia in the 50s. Palpable radial and pedal pulses bilaterally Lungs: CTAB, no wheezes, rhonchi, or rales noted.  Respiratory effort nonlabored Abd: soft, NT, ND MS: LUE in sling, L hand WWP Neuro: non focal exam  Psych: A&Ox4 with an appropriate affect.    Medications: Per CIR upon arrival    Follow-up Information     Evans Lance, MD Follow up on 03/24/2023.   Specialty: Cardiology Why: '@8'$ :30AM. EP follow up Contact information: [id number] [place] [address] [phone]         Donnajean Lopes, MD Follow up.   Specialty: Internal Medicine Why: Please reach out to your PCP's office to arrange a coumadin clinic visit within the next 5 days and follow up with Dr. Philip Aspen in the next 1-2 weeks Contact information: [place] [address] [phone]         Altamese Brodhead, MD. Schedule an appointment as soon as possible for a visit in 2 week(s).   Specialty: Orthopedic Surgery Why: to follow up for left clavicle fracture Contact information: [place] [address] [phone]                 Signed: Henreitta Cea, [facility] 03/01/2023, 11:13 AM Please see Amion for pager number during day hours 7:00am-4:30pm

## 2023-03-01 NOTE — TOC Progression Note (Signed)
Transition of Care [facility]) - Progression Note    Patient Details  Name: [name] MRN: [id number] Date of Birth: December 08, 1941  Transition of Care [facility]) CM/SW Contact  Ella Bodo, RN Phone Number: 03/01/2023, 2:10 PM  Clinical Narrative:    Patient for likely discharge home tomorrow after therapy.  Referral to [facility] for HHPT/OT, as recommended by therapies.  Referral to [facility] for shower seat, to be delivered to bedside prior to dc.    Expected Discharge Plan: [place] Barriers to Discharge: Continued Medical Work up  Expected Discharge Plan and Services   Discharge Planning Services: CM Consult Post Acute Care Choice: [place] arrangements for the past 2 months: Single Family Home                 DME Arranged: Shower stool   Date DME Agency Contacted: 03/01/23 Time DME Agency Contacted: [id number] Representative spoke with at DME Agency: Erasmo Downer HH Arranged: PT, OT HH Agency: [place] Date [place]: 03/01/23 Time [place]: 1300 Representative spoke with at [place]: [place] ([place]) Interventions SDOH Screenings   Tobacco Use: Low Risk  (01/26/2023)    Readmission Risk Interventions    02/11/2022   10:27 AM  Readmission Risk Prevention Plan  Transportation Screening Complete  PCP or Specialist Appt within 3-5 Days Complete  HRI or [place] Complete  Social Work Consult for [place] Planning/Counseling Complete  Palliative Care Screening Not Applicable  Medication Review (RN Care Manager) Complete    Reinaldo Raddle, RN, BSN  Trauma/Neuro ICU Case Manager [phone]

## 2023-03-01 NOTE — Progress Notes (Signed)
Progress Note     Subjective: Pt and husband working with education with PT this AM.   Objective: Vital signs in last 24 hours: Temp:  [97.7 F (36.5 C)-98.3 F (36.8 C)] 97.7 F (36.5 C) (03/05 0816) Pulse Rate:  [45-76] 50 (03/05 0816) Resp:  [18-20] 18 (03/05 0816) BP: (119-153)/(53-67) 153/67 (03/05 0816) SpO2:  [92 %-100 %] 92 % (03/05 0816) Last BM Date : 02/28/23  Intake/Output from previous day: 03/04 0701 - 03/05 0700 In: -  Out: 1850 [Urine:1850] Intake/Output this shift: Total I/O In: 300 [P.O.:300] Out: -   PE: General: pleasant, WD, elderly female who is sitting up in the chair HEENT: facial ecchymosis with small hematoma to right temple with sutures present, EOMI Lungs: Respiratory effort nonlabored Abd: soft, NT, ND MS: LUE in sling Neuro: non focal exam  Psych: A&Ox4 with an appropriate affect.    Lab Results:  Recent Labs    02/27/23 0707 02/28/23 0549  WBC 5.3 5.4  HGB 8.6* 9.2*  HCT 26.5* 29.0*  PLT 149* 170    BMET Recent Labs    02/28/23 0549 03/01/23 0910  NA 139 136  K 3.9 3.9  CL 103 102  CO2 28 25  GLUCOSE 79 84  BUN 26* 31*  CREATININE 2.00* 1.92*  CALCIUM 8.9 8.6*    PT/INR Recent Labs    02/28/23 0549 03/01/23 0451  LABPROT 30.2* 29.4*  INR 2.9* 2.8*    CMP     Component Value Date/Time   NA 136 03/01/2023 0910   NA 141 08/10/2022 0917   K 3.9 03/01/2023 0910   CL 102 03/01/2023 0910   CO2 25 03/01/2023 0910   GLUCOSE 84 03/01/2023 0910   BUN 31 (H) 03/01/2023 0910   BUN 29 (H) 08/10/2022 0917   CREATININE 1.92 (H) 03/01/2023 0910   CALCIUM 8.6 (L) 03/01/2023 0910   PROT 4.7 (L) 02/25/2023 0650   PROT 6.1 08/10/2022 0917   ALBUMIN 2.4 (L) 02/25/2023 0650   ALBUMIN 3.6 (L) 08/10/2022 0917   AST 20 02/25/2023 0650   ALT 16 02/25/2023 0650   ALKPHOS 46 02/25/2023 0650   BILITOT 0.5 02/25/2023 0650   BILITOT 0.3 08/10/2022 0917   GFRNONAA 26 (L) 03/01/2023 0910   GFRAA 32 (L) 07/01/2020 0837    Lipase     Component Value Date/Time   LIPASE 27 02/05/2022 0629       Studies/Results: No results found.  Anti-infectives: Anti-infectives (From admission, onward)    None        Assessment/Plan  Fall on coumadin TBI/R SDH/Contusion of the R frontal lobe + vertex - NSGY consult. Dr. Ellene Route. No acute intervention. No need to reverse anticoagulation.  He is ok with what cardiology needs to do in regards to her anticoagulation. Repeat CTH stable 3/1. Keppra for seizure ppx. Dr. [place] Silos d/w Dr. Ellene Route - OK to continue coumadin. TBI team therapies.  L clavicle fx - NWB per ortho, sling, f/u with Dr. Marcelino Scot 2 weeks  Scalp laceration - s/p repair 2/29, remove sutures prior to DC Hx PAF Hx AVR on Coumadin - continue coumadin per pharmacy Hx BPV  Hx PSP - followed by Dr. Carles Collet with neurology.  Patient has significant fall history and is unable to mobilize on her own.  She requires assistive device and support of husband at ALL times. Per report they were weaning carbidopa-levodopa at home, husband brought record of this will have pharmacy review HX CKD2 - creatinine at baseline;  adequate uop Hx HTN Hx HLD  FEN - reg diet, SLIV VTE - SCDs, Coumadin - INR 2.9 ID - None Foley - foley removed 3/4, voiding    Dispo - 4NP. Discharge tomorrow after PT/OT with husband and daughter   LOS: 5 days      Norm Parcel, [facility] 03/01/2023, 10:44 AM Please see Amion for pager number during day hours 7:00am-4:30pm

## 2023-03-01 NOTE — Progress Notes (Signed)
OT Cancellation Note  Patient Details Name: [name] MRN: [id number] DOB: October 28, 1941   Cancelled Treatment:    Reason Eval/Treat Not Completed: Patient's level of consciousness (Attempted to see patient for OT treatment this afternoon. Pt sleeping and unable to participate in tx session. Will plan to return tomorrow AM to complete family training with Husband and Daughter prior to discharge.)  Ailene Ravel, OTR/L,CBIS  Supplemental OT - [place] and [facility] Secure Chat Preferred   03/01/2023, 2:50 PM

## 2023-03-02 DIAGNOSIS — S069XAA Unspecified intracranial injury with loss of consciousness status unknown, initial encounter: Secondary | ICD-10-CM

## 2023-03-02 DIAGNOSIS — S069XAS Unspecified intracranial injury with loss of consciousness status unknown, sequela: Secondary | ICD-10-CM | POA: Diagnosis not present

## 2023-03-02 LAB — PROTIME-INR
INR: 2.3 — ABNORMAL HIGH (ref 0.8–1.2)
Prothrombin Time: 25 seconds — ABNORMAL HIGH (ref 11.4–15.2)

## 2023-03-02 MED ORDER — WARFARIN SODIUM 5 MG PO TABS
5.0000 mg | ORAL_TABLET | Freq: Once | ORAL | Status: AC
  Administered 2023-03-02: 5 mg via ORAL
  Filled 2023-03-02: qty 1

## 2023-03-02 NOTE — PMR Pre-admission (Addendum)
PMR Admission Coordinator Pre-Admission Assessment  Patient: [name] is an 82 y.o., female MRN: [id number] DOB: 03-19-1941 Height: 5' (152.4 cm) Weight: 54 kg              Insurance Information HMO: yes    PPO:      PCP:      IPA:      80/20:      OTHER:  PRIMARY: [employer]      Policy#: [id number]       Subscriber: Pt CM Name: Merleen Nicely      Phone#: [phone]     Fax#: [phone] Per Ubaldo Glassing with [employer] Medicare, Pt. Approved [id number]  Pre-Cert#: [id number]      Employer:  Benefits:  Phone #: [phone]     Name:  Eff. Date: 12/27/22     Deduct: $0      Out of Pocket Max: $4500 (met $20)      Life Max: N/A  CIR: $295/day for 6 days total $1770/admission      SNF: $0 days 1-20; $203 for days 21-100 Outpatient: med nec     Co-Pay: $20/visit Home Health: 100%      Co-Pay: none DME: 80%     Co-Pay: 20% Providers: in network  SECONDARY:       Policy#:       Phone#:   Development worker, community:       Phone#:   The Engineer, petroleum" for patients in Inpatient Rehabilitation Facilities with attached "Privacy Act [place] Records" was provided and verbally reviewed with: Family  Emergency Contact Information Contact Information     Name Relation Home Work Twain Harte D Spouse [phone]  [phone]   Harrison Mons Daughter [phone]  [phone]   Cordelia Pen Daughter [phone]        Current Medical History  Patient Admitting Diagnosis: TBI  History of Present Illness: [name] is a 82 y.o. female with hx of Progressive supranuclear palsy, BPV, HTN, HLD, CKD2, PAF and mechanical AVR on Coumadin (INR 2.4) who presented to the ED 02/24/23 after a fall. Patient was attempting to get out of her car when she fell. At baseline she has significant fall hx and cannot ambulate without assistive devices/husband's support. Found to have a small laceration to the R forehead with an arterial  bleed that was closed with figure of 8 stitch.  A large pon developed from this, but it did stop bleeding. Complains of L chest wall pain and L shoulder pain. Airway intact. . CXR and pelvic xrays done. CTH w/ R SDH and contusion of the R frontal lobe + vertex contusions. CT C-Spine negative, but with pain in her midline with ROM. L clavicle fx on films. Scalp laceration repaired 02/24/23. Neurosurgery recommends conservative management of SDH. Pt. NWB and in sling for clavicle fracture. No surgical intervention recommended for it either.    Glasgow Coma Scale Score: 15  Patient's medical record from [facility] has been reviewed by the rehabilitation admission coordinator and physician.  Past Medical History  Past Medical History:  Diagnosis Date   BPV (benign positional vertigo)    Chronic neck pain    C2-3 arthropathy   CKD (chronic kidney disease), stage II    Diverticulosis of colon    GERD (gastroesophageal reflux disease)    Hiatal hernia    History of breast cancer per pt no recurrence   dx 2004-- Left breast DCIS (ER/PR+, HER2 negative) s/p  partial masectomy  w/ sln bx and  chemoradiotion (completed 2004)   History of herpes zoster    History of kidney stones    HTN (hypertension)    Hyperlipidemia    Irritable bowel syndrome    LBBB (left bundle branch block)    PAF (paroxysmal atrial fibrillation) (HCC)    a. CHA2DS2VASc = 4-->coumadin.   Parkinsonism    neurologist-  dr tat   PONV (postoperative nausea and vomiting)    S/P aortic valve replacement with prosthetic valve    a. 04/13/2006 s/p St Jude mechnical AVR for severe AS;  b. 09/2014 Echo: EF 40-45%, gr1 DD, mild AI/MR, triv TR/PR.   Squamous cell carcinoma of skin 04/06/2022   right nasal sidewall   SUI (stress urinary incontinence, female)    Symptomatic anemia    Venous varices      Has the patient had major surgery during 100 days prior to admission? Yes  Family History  family history  includes Healthy in her child; Heart disease in her mother; Heart disease (age of onset: 89) in her father; Other in her brother.   Current Medications   Current Facility-Administered Medications:    acetaminophen (TYLENOL) tablet 650 mg, 650 mg, Oral, Q6H, White, Sharon Mt, MD, 650 mg at 03/02/23 0730   carbidopa-levodopa (SINEMET IR) 25-250 MG per tablet immediate release 1 tablet, 1 tablet, Oral, Once per day on Sun Mon Tue, White, Christopher M, MD, 1 tablet at 03/01/23 [id number]   carbidopa-levodopa (SINEMET IR) 25-250 MG per tablet immediate release 1 tablet, 1 tablet, Oral, 2 times per day on Sun Mon Tue, White, Christopher M, MD, 1 tablet at 03/01/23 1658   carbidopa-levodopa (SINEMET IR) 25-250 MG per tablet immediate release 2 tablet, 2 tablet, Oral, 3 times per day on Wed Thu Fri Sat, White, Christopher M, MD, 2 tablet at 03/02/23 1201   Chlorhexidine Gluconate Cloth 2 % PADS 6 each, 6 each, Topical, Daily, Ileana Roup, MD, 6 each at 02/28/23 0813   docusate sodium (COLACE) capsule 100 mg, 100 mg, Oral, BID, Ileana Roup, MD, 100 mg at 03/02/23 1011   guaiFENesin (ROBITUSSIN) 100 MG/5ML liquid 5 mL, 5 mL, Oral, QHS PRN, Ileana Roup, MD, 5 mL at 03/01/23 0242   levETIRAcetam (KEPPRA) tablet 500 mg, 500 mg, Oral, BID, Ileana Roup, MD, 500 mg at 03/02/23 1010   melatonin tablet 3 mg, 3 mg, Oral, QHS PRN, Ileana Roup, MD, 3 mg at 03/01/23 2127   menthol-cetylpyridinium (CEPACOL) lozenge 3 mg, 1 lozenge, Oral, PRN, Ileana Roup, MD, 3 mg at 02/26/23 2318   methocarbamol (ROBAXIN) tablet 500 mg, 500 mg, Oral, Q8H PRN, 500 mg at 03/01/23 2126 **OR** methocarbamol (ROBAXIN) 500 mg in dextrose 5 % 50 mL IVPB, 500 mg, Intravenous, Q8H PRN, Ileana Roup, MD   morphine (PF) 2 MG/ML injection 2 mg, 2 mg, Intravenous, Q3H PRN, Barkley Boards R, PA-C   ondansetron (ZOFRAN-ODT) disintegrating tablet 4 mg, 4 mg, Oral, Q6H PRN **OR** ondansetron  (ZOFRAN) injection 4 mg, 4 mg, Intravenous, Q6H PRN, Ileana Roup, MD   Oral care mouth rinse, 15 mL, Mouth Rinse, PRN, Ileana Roup, MD, 15 mL at 02/26/23 2220   simvastatin (ZOCOR) tablet 20 mg, 20 mg, Oral, q1800, Ileana Roup, MD, 20 mg at 03/01/23 1658   tamsulosin (FLOMAX) capsule 0.4 mg, 0.4 mg, Oral, Daily, Ileana Roup, MD, 0.4 mg at 03/02/23 1010   traMADol (ULTRAM) tablet 50 mg, 50 mg, Oral,  Q6H PRN, Ileana Roup, MD, 50 mg at 03/01/23 2127   warfarin (COUMADIN) tablet 5 mg, 5 mg, Oral, ONCE-1600, Carolin Guernsey, [facility]   Warfarin - Pharmacist Dosing Inpatient, , Does not apply, q1600, Ileana Roup, MD, Given at 03/01/23 1659  Patients Current Diet:  Diet Order             Diet regular Room service appropriate? Yes; Fluid consistency: Thin  Diet effective now                   Precautions / Restrictions Precautions Precautions: Fall Precaution Comments: pt is a high fall risk, left clavicle fx Restrictions Weight Bearing Restrictions: Yes LUE Weight Bearing: Non weight bearing Other Position/Activity Restrictions: NWB in sling   Has the patient had 2 or more falls or a fall with injury in the past year?Yes  Prior Activity Level Community (5-7x/wk): Pt.s husband took her out for errands regularly  Prior Functional Level Prior Function Prior Level of Function : Needs assist Mobility Comments: hands on assist with rollator for walking ADLs Comments: husband assists with bathing and dressing  Self Care: Did the patient need help bathing, dressing, using the toilet or eating?  Needed some help  Indoor Mobility: Did the patient need assistance with walking from room to room (with or without device)? Needed some help  Stairs: Did the patient need assistance with internal or external stairs (with or without device)? Needed some help  Functional Cognition: Did the patient need help planning regular tasks such as  shopping or remembering to take medications? Needed some help  Patient Information Are you of Hispanic, Latino/a,or Spanish origin?: A. No, not of Hispanic, Latino/a, or Spanish origin What is your race?: A. White Do you need or want an interpreter to communicate with a doctor or health care staff?: 0. No (Answers from proxy)  Patient's Response To:  Health Literacy and Transportation Is the patient able to respond to health literacy and transportation needs?: No (proxy answered) Health Literacy - How often do you need to have someone help you when you read instructions, pamphlets, or other written material from your doctor or pharmacy?: Never In the past 12 months, has lack of transportation kept you from medical appointments or from getting medications?: No In the past 12 months, has lack of transportation kept you from meetings, work, or from getting things needed for daily living?: No  Development worker, international aid / Equipment Home Equipment: Rollator (4 wheels), Grab bars - toilet, Grab bars - tub/shower, Wheelchair - manual, Transport planner  Prior Device Use: Indicate devices/aids used by the patient prior to current illness, exacerbation or injury? Walker  Current Functional Level Cognition  Arousal/Alertness: Awake/alert Overall Cognitive Status: Impaired/Different from baseline Orientation Level: Oriented X4 Following Commands: Follows one step commands with increased time Safety/Judgement: Decreased awareness of deficits, Decreased awareness of safety General Comments: Eyes remained close for most of session although able to open eyes when cued. Daughter reports pt is sensitive to light. Attention: Selective Selective Attention: Impaired Selective Attention Impairment: Verbal complex Memory: Appears intact Awareness: Appears intact Problem Solving: Impaired Problem Solving Impairment: Verbal basic Safety/Judgment: Appears intact    Extremity Assessment (includes  Sensation/Coordination)  Upper Extremity Assessment: LUE deficits/detail LUE Deficits / Details: clavicle fx, no pain and attempting to use functionally, sling aarived at end of session and donned LUE Sensation: WNL LUE Coordination: decreased gross motor  Lower Extremity Assessment: Defer to PT evaluation    ADLs  Overall  ADL's : Needs assistance/impaired Eating/Feeding: NPO Grooming: Min guard, Wash/dry face, Sitting Upper Body Bathing: Moderate assistance, With caregiver independent assisting, Standing Lower Body Bathing: Maximal assistance, With caregiver independent assisting Lower Body Bathing Details (indicate cue type and reason): Pt stands in the shower and holds onto the grab bar and her husband bathes her in standing Upper Body Dressing : Moderate assistance, Sitting Upper Body Dressing Details (indicate cue type and reason): extra gown Lower Body Dressing: Min guard, Sitting/lateral leans Lower Body Dressing Details (indicate cue type and reason): able to don socks long sitting in the bed Toilet Transfer: BSC/3in1, Cueing for safety, Cueing for sequencing, Moderate assistance, Minimal assistance Toilet Transfer Details (indicate cue type and reason): step pvt, face to face transfer. Required physical assist to bring trunk forward when initiating sit to stand as well as when decending to bed surface. Once standing, pt required Min A for balance. Toileting- Clothing Manipulation and Hygiene: Moderate assistance, Cueing for safety, Cueing for sequencing, Sit to/from stand Functional mobility during ADLs: Minimal assistance, +2 for safety/equipment, +2 for physical assistance (hand held assist) General ADL Comments: Hands on family training completed with Husband and daughter regarding toileting and toilet transfer using BSC.    Mobility  Overal bed mobility: Needs Assistance Bed Mobility: Sit to Supine Rolling: Min assist Sidelying to sit: Min assist Supine to sit: Min  assist Sit to supine: Min assist Sit to sidelying: Min assist General bed mobility comments: worked with spouse and dtr on assisting pt with getting in/out of bed via log rolling to the R and pushing upwith R UE with contact cues at hips. Verbal cues to spouse to help direct and assist pt, dtr present. minA for trunk elevation with constant directional cues    Transfers  Overall transfer level: Needs assistance Equipment used: 1 person hand held assist Transfers: Sit to/from Stand, Bed to chair/wheelchair/BSC Sit to Stand: Mod assist Bed to/from chair/wheelchair/BSC transfer type:: Step pivot Step pivot transfers: Min assist General transfer comment: Family provided with VC and visual demonstration on transfer technique including gait belt use, safety awareness, and trype of directional cues to provide. When completing a step pvt transfer from/to bed and [facility], daughter completed a face to face transfer technique. When completing a sit to stand and then walk to recliner, OT recommended standing to the right side of patient (due to LUE WB restrictions). Pt and daughter to hold hands on right and daughter's left hand to help faciliate trunk flexion to initiate sit<>stand.    Ambulation / Gait / Stairs / Wheelchair Mobility  Ambulation/Gait Ambulation/Gait assistance: Mod assist, +2 safety/equipment (chair follow) Gait Distance (Feet): 50 Feet (x2) Assistive device: 1 person hand held assist Gait Pattern/deviations: Step-to pattern, Decreased stride length, Narrow base of support General Gait Details: had spouse amb with pt providing R HHA and use of hand around waist holding onto gait belt to provide stability for first 50', s/p rest break dtr then amb pt with same technique. verbal cues on proper hand placement and how to use whole body if needed to support pt. overall pt min/modA as pt unable to use RW due to L UE NWB Gait velocity: slow Gait velocity interpretation: <1.31 ft/sec, indicative of  household ambulator    Posture / Balance Balance Overall balance assessment: Needs assistance Sitting-balance support: No upper extremity supported, Feet supported Sitting balance-Leahy Scale: Fair Postural control: Posterior lean Standing balance support: Single extremity supported Standing balance-Leahy Scale: Poor    Special needs/care consideration Special  service needs sling for clavicle fx     Previous Home Environment (from acute therapy documentation) Living Arrangements: Spouse/significant other  Lives With: Spouse Available Help at Discharge: Family, Available 24 hours/day Type of Home: House Home Layout: Two level, Able to live on main level with bedroom/bathroom Alternate Level Stairs-Rails: Right, Left Alternate Level Stairs-Number of Steps: flight Home Access: Stairs to enter Entrance Stairs-Rails: None Entrance Stairs-Number of Steps: 1 Bathroom Shower/Tub: Multimedia programmer: Handicapped height Bathroom Accessibility: Yes How Accessible: Accessible via walker Additional Comments: will furniture walk or walk with husband, uses helmet for mobility at home  Discharge Living Setting Plans for Discharge Living Setting: Patient's home Type of Home at Discharge: House Discharge Home Layout: Two level, Able to live on main level with bedroom/bathroom Alternate Level Stairs-Rails: Left, Right Alternate Level Stairs-Number of Steps: full flight Discharge Home Access: Stairs to enter Entrance Stairs-Rails: None Entrance Stairs-Number of Steps: 1 Discharge Bathroom Shower/Tub: Walk-in shower Discharge Bathroom Toilet: Handicapped height Discharge Bathroom Accessibility: Yes How Accessible: Accessible via walker  Social/Family/Support Systems Patient Roles: Spouse Contact Information: [phone] Anticipated Caregiver: Debany Wisler Anticipated Caregiver's Contact Information: Very involved, can provide 24/7 min assist Ability/Limitations of  Caregiver: Elderly but in good health, can provide min A Caregiver Availability: 24/7 Discharge Plan Discussed with Primary Caregiver: Yes Is Caregiver In Agreement with Plan?: Yes Does Caregiver/Family have Issues with Lodging/Transportation while Pt is in Rehab?: No   Goals Patient/Family Goal for Rehab: PT/OT/SLP Supervision to min guard  Expected length of stay: 14-16 days Pt/Family Agrees to Admission and willing to participate: Yes   Decrease burden of Care through IP rehab admission: not anticipated   Possible need for SNF placement upon discharge:none    Patient Condition: This patient's medical and functional status has changed since the consult dated: 03/02/23 in which the Rehabilitation Physician determined and documented that the patient's condition is appropriate for intensive rehabilitative care in an inpatient rehabilitation facility. See "History of Present Illness" (above) for medical update. Functional changes are: Pt. Now Mod A for transfers, Mod+2 for 20 ft of gait.. Patient's medical and functional status update has been discussed with the Rehabilitation physician and patient remains appropriate for inpatient rehabilitation. Will admit to inpatient rehab today.  Preadmission Screen Completed By:  Genella Mech, CCC-SLP, 03/02/2023 2:16 PM ______________________________________________________________________   Discussed status with Dr. Dagoberto Ligas on 03/08/23 at 1015 and received approval for admission today.  Admission Coordinator:  Genella Mech, time 1016/Date3/12/24

## 2023-03-02 NOTE — Progress Notes (Signed)
Progress Note     Subjective: Pt with some intermittent headache. Husband and daughter going through education with OT this AM on how to assist patient with mobility.   Objective: Vital signs in last 24 hours: Temp:  [97.7 F (36.5 C)-98.5 F (36.9 C)] 97.8 F (36.6 C) (03/06 0750) Pulse Rate:  [48-82] 62 (03/06 0750) Resp:  [15-26] 20 (03/06 0750) BP: (108-134)/(59-67) 131/62 (03/06 0750) SpO2:  [80 %-99 %] 98 % (03/06 0750) Last BM Date : 02/28/23  Intake/Output from previous day: 03/05 0701 - 03/06 0700 In: 300 [P.O.:300] Out: 500 [Urine:500] Intake/Output this shift: No intake/output data recorded.  PE: General: pleasant, WD, elderly female who is sitting up in the chair HEENT: facial ecchymosis with small hematoma to right temple with sutures present (removed without complication), EOMI Lungs: Respiratory effort nonlabored Abd: soft, NT, ND MS: LUE in sling Neuro: non focal exam  Psych: A&Ox4 with an appropriate affect.    Lab Results:  Recent Labs    02/28/23 0549  WBC 5.4  HGB 9.2*  HCT 29.0*  PLT 170    BMET Recent Labs    02/28/23 0549 03/01/23 0910  NA 139 136  K 3.9 3.9  CL 103 102  CO2 28 25  GLUCOSE 79 84  BUN 26* 31*  CREATININE 2.00* 1.92*  CALCIUM 8.9 8.6*    PT/INR Recent Labs    03/01/23 0451 03/02/23 0448  LABPROT 29.4* 25.0*  INR 2.8* 2.3*    CMP     Component Value Date/Time   NA 136 03/01/2023 0910   NA 141 08/10/2022 0917   K 3.9 03/01/2023 0910   CL 102 03/01/2023 0910   CO2 25 03/01/2023 0910   GLUCOSE 84 03/01/2023 0910   BUN 31 (H) 03/01/2023 0910   BUN 29 (H) 08/10/2022 0917   CREATININE 1.92 (H) 03/01/2023 0910   CALCIUM 8.6 (L) 03/01/2023 0910   PROT 4.7 (L) 02/25/2023 0650   PROT 6.1 08/10/2022 0917   ALBUMIN 2.4 (L) 02/25/2023 0650   ALBUMIN 3.6 (L) 08/10/2022 0917   AST 20 02/25/2023 0650   ALT 16 02/25/2023 0650   ALKPHOS 46 02/25/2023 0650   BILITOT 0.5 02/25/2023 0650   BILITOT 0.3  08/10/2022 0917   GFRNONAA 26 (L) 03/01/2023 0910   GFRAA 32 (L) 07/01/2020 0837   Lipase     Component Value Date/Time   LIPASE 27 02/05/2022 0629       Studies/Results: No results found.  Anti-infectives: Anti-infectives (From admission, onward)    None        Assessment/Plan  Fall on coumadin TBI/R SDH/Contusion of the R frontal lobe + vertex - NSGY consult. Dr. Ellene Route. No acute intervention. No need to reverse anticoagulation.  He is ok with what cardiology needs to do in regards to her anticoagulation. Repeat CTH stable 3/1. Keppra for seizure ppx. Dr. Grandville Silos d/w Dr. Ellene Route - OK to continue coumadin. TBI team therapies.  L clavicle fx - NWB per ortho, sling, f/u with Dr. Marcelino Scot 2 weeks  Scalp laceration - s/p repair 2/29, sutures removed today Hx PAF Hx AVR on Coumadin - continue coumadin per pharmacy Hx BPV  Hx PSP - followed by Dr. Carles Collet with neurology.  Patient has significant fall history and is unable to mobilize on her own.  She requires assistive device and support of husband at ALL times. Per report they were weaning carbidopa-levodopa at home, continue here per pharmacy HX CKD2 - creatinine at baseline; adequate uop  Hx HTN Hx HLD  FEN - reg diet, SLIV VTE - SCDs, Coumadin - INR 2.3 ID - None Foley - foley removed 3/4, voiding    Dispo - 4NP. Therapies now recommending CIR if possible, they will eval. Continue therapies with plan for CIR vs home with [facility] once ready from a mobility standpoint. Medically stable for CIR.   LOS: 6 days      Norm Parcel, [facility] 03/02/2023, 10:31 AM Please see Amion for pager number during day hours 7:00am-4:30pm

## 2023-03-02 NOTE — Progress Notes (Signed)
ANTICOAGULATION CONSULT NOTE - Initial Consult  Pharmacy Consult for warfarin  Indication: atrial fibrillation and mechanical aortic valve   Allergies  Allergen Reactions   Demerol [Meperidine] Shortness Of Breath and Swelling   Oxycontin [Oxycodone] Other (See Comments)    Hallucinations    Adhesive [Tape] Other (See Comments)    Redness and skin tears   Cozaar [Losartan] Other (See Comments)    Dizziness    Fosamax [Alendronate] Other (See Comments)    Heartburn    Ivp Dye [Iodinated Contrast Media] Hives    OK with benadryl pre-med ('50mg'$  one hour before receiving iodinated contrast agent)   Phenergan [Promethazine] Other (See Comments)    Avoids due to reaction with current medications    Patient Measurements: Height: 5' (152.4 cm) Weight: 54 kg (119 lb 0.8 oz) IBW/kg (Calculated) : 45.5  Vital Signs: Temp: 97.8 F (36.6 C) (03/06 0750) Temp Source: Oral (03/06 0750) BP: 131/62 (03/06 0750) Pulse Rate: 62 (03/06 0750)  Labs: Recent Labs    02/28/23 0549 03/01/23 0451 03/01/23 0910 03/02/23 0448  HGB 9.2*  --   --   --   HCT 29.0*  --   --   --   PLT 170  --   --   --   LABPROT 30.2* 29.4*  --  25.0*  INR 2.9* 2.8*  --  2.3*  CREATININE 2.00*  --  1.92*  --      Estimated Creatinine Clearance: 16.2 mL/min (A) (by C-G formula based on SCr of 1.92 mg/dL (H)).   Medical History: Past Medical History:  Diagnosis Date   BPV (benign positional vertigo)    Chronic neck pain    C2-3 arthropathy   CKD (chronic kidney disease), stage II    Diverticulosis of colon    GERD (gastroesophageal reflux disease)    Hiatal hernia    History of breast cancer per pt no recurrence   dx 2004-- Left breast DCIS (ER/PR+, HER2 negative) s/p  partial masectomy w/ sln bx and  chemoradiotion (completed 2004)   History of herpes zoster    History of kidney stones    HTN (hypertension)    Hyperlipidemia    Irritable bowel syndrome    LBBB (left bundle branch block)    PAF  (paroxysmal atrial fibrillation) (HCC)    a. CHA2DS2VASc = 4-->coumadin.   Parkinsonism    neurologist-  dr tat   PONV (postoperative nausea and vomiting)    S/P aortic valve replacement with prosthetic valve    a. 04/13/2006 s/p St Jude mechnical AVR for severe AS;  b. 09/2014 Echo: EF 40-45%, gr1 DD, mild AI/MR, triv TR/PR.   Squamous cell carcinoma of skin 04/06/2022   right nasal sidewall   SUI (stress urinary incontinence, female)    Symptomatic anemia    Venous varices      Assessment: Patient admitted with SDH. On warfarin PTA, regimen is 2.'5mg'$  all days except '5mg'$  on Thursdays. Confirmed with husband at bedside. Neurosurgery okay with resuming anticoagulation.   CBC stable 3/4.   2/29: INR 2.4; missed warfarin dose.  3/1: INR 2.1; warfarin '5mg'$  x1 -patient's home simvastatin was restarted on 3/1 (interaction w/ warfarin, inc anticoagulant effect) 3/2: INR 2.0; warfarin '5mg'$  x1 3/3: INR 2.7; warfarin '1mg'$  x1 3/4: INR 2.9; warfarin '1mg'$  x1 3/5: INR 2.8; warfarin 2.5 mg x1 3/6: INR 2.3;    Goal of Therapy:  INR 2.5-3.5 (confirmed w/ husband at bedside)    Plan:  Warfarin '5mg'$  PO  x1 today @ 1600 Daily INR.   Wilson Singer, PharmD Clinical Pharmacist 03/02/2023 8:57 AM

## 2023-03-02 NOTE — Consult Note (Signed)
Physical Medicine and Rehabilitation Consult Reason for Consult: Assess candidacy for CIR Referring Physician: Trauma MD   HPI: [name] is a 82 y.o. female with a history of PSP that caused pre-admission stability impairments, who had a fall in a parking lot while exiting her car resulting in a TBI/R SDH/contusion of the right frontal lobe and vertex. NSGY Dr. Ellene Route was consulted and no acute intervention was planned. She was on coumadin and maintained on this as per cardiology recommendations. Repeat Head CT on 3/1 was stable. Keppra was started for seizure prophylaxis. Husband at bedside is extremely devoted. Physical Medicine & Rehabilitation was consulted to assess candidacy for CIR.     Review of Systems  Constitutional:  Positive for malaise/fatigue.   +impaired mobility, preadmission instability due to PSP Past Medical History:  Diagnosis Date   BPV (benign positional vertigo)    Chronic neck pain    C2-3 arthropathy   CKD (chronic kidney disease), stage II    Diverticulosis of colon    GERD (gastroesophageal reflux disease)    Hiatal hernia    History of breast cancer per pt no recurrence   dx 2004-- Left breast DCIS (ER/PR+, HER2 negative) s/p  partial masectomy w/ sln bx and  chemoradiotion (completed 2004)   History of herpes zoster    History of kidney stones    HTN (hypertension)    Hyperlipidemia    Irritable bowel syndrome    LBBB (left bundle branch block)    PAF (paroxysmal atrial fibrillation) ([place])    a. CHA2DS2VASc = 4-->coumadin.   Parkinsonism    neurologist-  dr tat   PONV (postoperative nausea and vomiting)    S/P aortic valve replacement with prosthetic valve    a. 04/13/2006 s/p St Jude mechnical AVR for severe AS;  b. 09/2014 Echo: EF 40-45%, gr1 DD, mild AI/MR, triv TR/PR.   Squamous cell carcinoma of skin 04/06/2022   right nasal sidewall   SUI (stress urinary incontinence, female)    Symptomatic anemia    Venous varices      Past Surgical History:  Procedure Laterality Date   ANTERIOR AND POSTERIOR REPAIR  1990   cystocele/ rectocele   AORTIC VALVE REPLACEMENT  04/13/06   dr gerhardt   and Replacement Ascending Aorta (St. Jude mechanical prosthesis)   APPENDECTOMY  child 1950   BREAST EXCISIONAL BIOPSY Left    BREAST LUMPECTOMY Left 01/11/2003   malignant   BREAST SURGERY Left 2004   CARDIAC CATHETERIZATION  02-22-2001   dr Martinique   minimal non-obstructive cad/  mild aortic stenosis and aortic root enlargement/  normal LVF, ef 65%   CARDIAC CATHETERIZATION  03-22-2006    dr Martinique   no significant obstructive cad/  severe aortic stenosis and mild to moderate aortic root enlargement/  upper normal right heart pressures   CARDIOVASCULAR STRESS TEST  09-30-2011   dr Martinique   lexiscan nuclear study w/ apical thinning  vs  small prior apical infarct w/ no significant ischemia/  hypokinesis of the distal septum and apex, ef 50%   CARPAL TUNNEL RELEASE Bilateral left 09-09-2004/  right 2007   CATARACT EXTRACTION W/ INTRAOCULAR LENS  IMPLANT, BILATERAL  2015   COLONOSCOPY N/A 10/03/2014   Procedure: COLONOSCOPY;  Surgeon: [name], MD;  Location: [place];  Service: Endoscopy;  Laterality: N/A;   CYSTOSCOPY WITH RETROGRADE PYELOGRAM, URETEROSCOPY AND STENT PLACEMENT Right 09/17/2016   Procedure: CYSTOSCOPY WITH RIGHT RETROGRADE PYELOGRAM, RIGHT URETEROSCOPY  AND STENT PLACEMENT LASER LITHOTRIPSY, RIGHT STENT PLACEMENT;  Surgeon: [name], MD;  Location: [facility];  Service: Urology;  Laterality: Right;   ESOPHAGEAL MANOMETRY N/A 08/12/2014   Procedure: ESOPHAGEAL MANOMETRY (EM);  Surgeon: [name], MD;  Location: [facility];  Service: Endoscopy;  Laterality: N/A;   ESOPHAGOGASTRODUODENOSCOPY N/A 10/03/2014   Procedure: ESOPHAGOGASTRODUODENOSCOPY (EGD);  Surgeon: [name], MD;  Location: [facility];  Service: Endoscopy;  Laterality: N/A;   ESOPHAGOGASTRODUODENOSCOPY  N/A 12/08/2018   Procedure: ESOPHAGOGASTRODUODENOSCOPY (EGD);  Surgeon: Doran Stabler, MD;  Location: [place];  Service: Gastroenterology;  Laterality: N/A;   EXTRACORPOREAL SHOCK WAVE LITHOTRIPSY  1997   FOOT SURGERY Right 2013   hammertoe x2   HOLMIUM LASER APPLICATION Right [id number]   Procedure: HOLMIUM LASER APPLICATION;  Surgeon: [name], MD;  Location: [facility];  Service: Urology;  Laterality: Right;   PERCUTANEOUS NEPHROSTOLITHOTOMY  1993   STONE EXTRACTION WITH BASKET Right 09/17/2016   Procedure: STONE EXTRACTION WITH BASKET;  Surgeon: [name], MD;  Location: [facility];  Service: Urology;  Laterality: Right;   TOTAL ABDOMINAL HYSTERECTOMY  1971   w/ Bilateral Salpingoophorectomy   TRANSTHORACIC ECHOCARDIOGRAM  10/24/2014   dr Martinique   hypokinesis of the basal and mid inferoseptal and inferior walls,  ef 40-45%,  grade 1 diastolic dysfunction/  mechanical bileaflet aortic valve noted w/ mild regur., peak grandient 67mHg, valve area 1.35cm^2/  severe MV calcification without stenosis w/ mild regurg., peak gradient 356mg/  mild LAE/  poss. atrial mass (MRI showed benign lipomatous hypertrophy of atrial septum) /tFrazier ButtR/mild TR   Family History  Problem Relation Age of Onset   Heart disease Mother    Heart disease Father [id number]     CABG   Other Brother        AVR   Healthy Child    Social History:  reports that she has never smoked. She has never used smokeless tobacco. She reports that she does not drink alcohol and does not use drugs. Allergies:  Allergies  Allergen Reactions   Demerol [Meperidine] Shortness Of Breath and Swelling   Oxycontin [Oxycodone] Other (See Comments)    Hallucinations    Adhesive [Tape] Other (See Comments)    Redness and skin tears   Cozaar [Losartan] Other (See Comments)    Dizziness    Fosamax [Alendronate] Other (See Comments)    Heartburn    Ivp Dye [Iodinated Contrast Media]  Hives    OK with benadryl pre-med ('50mg'$  one hour before receiving iodinated contrast agent)   Phenergan [Promethazine] Other (See Comments)    Avoids due to reaction with current medications   Medications Prior to Admission  Medication Sig Dispense Refill   amiodarone (PACERONE) 200 MG tablet Take 1 tablet by mouth once daily (Patient taking differently: Take 200 mg by mouth See admin instructions. 1 tablet every morning on Monday-Saturday. Does not take on Sunday.) 90 tablet 3   CALCIUM CITRATE PO Take 1 tablet by mouth 3 (three) times daily.     carbidopa-levodopa (SINEMET IR) 25-100 MG tablet TAKE 2 TABLETS BY MOUTH THREE TIMES DAILY (Patient taking differently: Take 1-2 tablets by mouth See admin instructions. Week of 02/27/23: 2 tablets in the morning, 2 tablets at noon, 1 tablet in the evening on Sunday-Thursday. 2 tablets three times daily on Friday, Saturday. Week of 03/06/23: 2 tablets in the morning, 2 tablets at noon, 1 tablet in  the evening Sunday-Friday. 2 tablets three times daily on Saturday. Week of 03/13/23: 2 tablets in the morning, 2 tablets at noon, 1 tablet in the evening) 540 tablet 0   Cholecalciferol (VITAMIN D3) 2000 units TABS Take 2,000 Units by mouth daily.      famotidine-calcium carbonate-magnesium hydroxide (PEPCID COMPLETE) 10-800-165 MG CHEW chewable tablet Chew 1 tablet by mouth daily as needed (heartburn).     fexofenadine (ALLEGRA) 60 MG tablet Take 60 mg by mouth at bedtime.     Fish Oil-Krill Oil (SCHIFF KRILL & FISH OIL BLEND) CAPS Take 3 capsules by mouth daily at 12 noon.     furosemide (LASIX) 20 MG tablet Take 1 tablet by mouth once daily 90 tablet 3   melatonin 5 MG TABS Take 5 mg by mouth at bedtime.     metoprolol tartrate (LOPRESSOR) 25 MG tablet Take 1 tablet (25 mg total) by mouth daily as needed (if heart rate is above 110). 90 tablet 3   Multiple Vitamin (MULTIVITAMIN) tablet Take 1 tablet by mouth daily at 12 noon.     Multiple Vitamins-Minerals  (PRESERVISION AREDS 2) CAPS Take 1 capsule by mouth 2 (two) times daily.     OVER THE COUNTER MEDICATION Take 1 capsule by mouth daily. Neuriva     pantoprazole (PROTONIX) 40 MG tablet Take 40 mg by mouth daily.     potassium chloride SA (KLOR-CON M) 20 MEQ tablet Take 20 mEq by mouth daily.     Probiotic Product (PROBIOTIC PO) Take 1 capsule by mouth daily.     simvastatin (ZOCOR) 20 MG tablet Take 20 mg by mouth every evening.     vitamin B-12 (CYANOCOBALAMIN) 1000 MCG tablet Take 1,000 mcg by mouth daily.     warfarin (COUMADIN) 5 MG tablet Take 2.5-5 mg by mouth See admin instructions. 5 mg every Thursday evening 2.5 mg every evening on all other days      Home: [place] expects to be discharged to:: Private residence Living Arrangements: Spouse/significant other Available Help at Discharge: Family, Available 24 hours/day Type of Home: House Home Access: Stairs to enter [employer] of Steps: 1 Entrance Stairs-Rails: None Home Layout: Two level, Able to live on main level with bedroom/bathroom Alternate Level Stairs-Number of Steps: flight Alternate Level Stairs-Rails: Right, Left Bathroom Shower/Tub: Multimedia programmer: Handicapped height Bathroom Accessibility: Yes Home Equipment: Rollator (4 wheels), Grab bars - toilet, Grab bars - tub/shower, Wheelchair - manual, Transport planner Additional Comments: will furniture walk or walk with husband, uses helmet for mobility at home  Lives With: Spouse  Functional History: Prior Function Prior Level of Function : Needs assist Mobility Comments: hands on assist with rollator for walking ADLs Comments: husband assists with bathing and dressing Functional Status:  Mobility: Bed Mobility Overal bed mobility: Needs Assistance Bed Mobility: Sit to Supine Rolling: Min assist Sidelying to sit: Min assist Supine to sit: Min assist Sit to supine: Min assist Sit to sidelying: Min assist General  bed mobility comments: worked with spouse and dtr on assisting pt with getting in/out of bed via log rolling to the R and pushing upwith R UE with contact cues at hips. Verbal cues to spouse to help direct and assist pt, dtr present. minA for trunk elevation with constant directional cues Transfers Overall transfer level: Needs assistance Equipment used: 1 person hand held assist Transfers: Sit to/from Stand, Bed to chair/wheelchair/BSC Sit to Stand: Mod assist Bed to/from chair/wheelchair/BSC transfer type:: Step pivot Step pivot  transfers: Min assist General transfer comment: Family provided with VC and visual demonstration on transfer technique including gait belt use, safety awareness, and trype of directional cues to provide. When completing a step pvt transfer from/to bed and [facility], daughter completed a face to face transfer technique. When completing a sit to stand and then walk to recliner, OT recommended standing to the right side of patient (due to LUE WB restrictions). Pt and daughter to hold hands on right and daughter's left hand to help faciliate trunk flexion to initiate sit<>stand. Ambulation/Gait Ambulation/Gait assistance: Mod assist, +2 safety/equipment (chair follow) Gait Distance (Feet): 50 Feet (x2) Assistive device: 1 person hand held assist Gait Pattern/deviations: Step-to pattern, Decreased stride length, Narrow base of support General Gait Details: had spouse amb with pt providing R HHA and use of hand around waist holding onto gait belt to provide stability for first 50', s/p rest break dtr then amb pt with same technique. verbal cues on proper hand placement and how to use whole body if needed to support pt. overall pt min/modA as pt unable to use RW due to L UE NWB Gait velocity: slow Gait velocity interpretation: <1.31 ft/sec, indicative of household ambulator    ADL: ADL Overall ADL's : Needs assistance/impaired Eating/Feeding: NPO Grooming: Min guard, Wash/dry  face, Sitting Upper Body Bathing: Moderate assistance, With caregiver independent assisting, Standing Lower Body Bathing: Maximal assistance, With caregiver independent assisting Lower Body Bathing Details (indicate cue type and reason): Pt stands in the shower and holds onto the grab bar and her husband bathes her in standing Upper Body Dressing : Moderate assistance, Sitting Upper Body Dressing Details (indicate cue type and reason): extra gown Lower Body Dressing: Min guard, Sitting/lateral leans Lower Body Dressing Details (indicate cue type and reason): able to don socks long sitting in the bed Toilet Transfer: BSC/3in1, Cueing for safety, Cueing for sequencing, Moderate assistance, Minimal assistance Toilet Transfer Details (indicate cue type and reason): step pvt, face to face transfer. Required physical assist to bring trunk forward when initiating sit to stand as well as when decending to bed surface. Once standing, pt required Min A for balance. Toileting- Clothing Manipulation and Hygiene: Moderate assistance, Cueing for safety, Cueing for sequencing, Sit to/from stand Functional mobility during ADLs: Minimal assistance, +2 for safety/equipment, +2 for physical assistance (hand held assist) General ADL Comments: Hands on family training completed with Husband and daughter regarding toileting and toilet transfer using BSC.  Cognition: Cognition Overall Cognitive Status: Impaired/Different from baseline Arousal/Alertness: Awake/alert Orientation Level: Oriented X4 Attention: Selective Selective Attention: Impaired Selective Attention Impairment: Verbal complex Memory: Appears intact Awareness: Appears intact Problem Solving: Impaired Problem Solving Impairment: Verbal basic Safety/Judgment: Appears intact Cognition Arousal/Alertness: Awake/alert Behavior During Therapy: Flat affect Overall Cognitive Status: Impaired/Different from baseline Area of Impairment: Safety/judgement,  Awareness, Problem solving, Memory, Following commands Memory: Decreased short-term memory Following Commands: Follows one step commands with increased time Safety/Judgement: Decreased awareness of deficits, Decreased awareness of safety Awareness: Intellectual Problem Solving: Decreased initiation, Requires verbal cues, Difficulty sequencing, Slow processing General Comments: Eyes remained close for most of session although able to open eyes when cued. Daughter reports pt is sensitive to light.  Blood pressure 131/62, pulse 62, temperature 97.8 F (36.6 C), temperature source Oral, resp. rate 20, height 5' (1.524 m), weight 54 kg, SpO2 98 %. Physical Exam Gen: no distress, normal appearing HEENT: oral mucosa pink and moist, NCAT Cardio: Reg rate Chest: normal effort, normal rate of breathing Abd: soft, non-distended Ext: no  edema Psych: pleasant, normal affect, bright and smiling once awoken Skin: intact Neuro: Somnolent but easily arousable MSK: severe bunions, left arm in sling, 4/5 strength in other extremities Results for orders placed or performed during the hospital encounter of 02/24/23 (from the past 24 hour(s))  Protime-INR     Status: Abnormal   Collection Time: 03/02/23  4:48 AM  Result Value Ref Range   Prothrombin Time 25.0 (H) 11.4 - 15.2 seconds   INR 2.3 (H) 0.8 - 1.2   No results found.  Assessment/Plan: Diagnosis: TBI Does the need for close, 24 hr/day medical supervision in concert with the patient's rehab needs make it unreasonable for this patient to be served in a less intensive setting? Yes Co-Morbidities requiring supervision/potential complications:  PSP: continue Sinemet  Fall Urinary retention: continue flomax HLD: continue simvastatin Aortic valve disease Due to bladder management, bowel management, safety, skin/wound care, disease management, medication administration, pain management, and patient education, does the patient require 24 hr/day rehab  nursing? Yes Does the patient require coordinated care of a physician, rehab nurse, therapy disciplines of PT, OT, SLP to address physical and functional deficits in the context of the above medical diagnosis(es)? Yes Addressing deficits in the following areas: balance, endurance, locomotion, strength, transferring, bowel/bladder control, bathing, dressing, feeding, grooming, toileting, cognition, and psychosocial support Can the patient actively participate in an intensive therapy program of at least 3 hrs of therapy per day at least 5 days per week? Yes The potential for patient to make measurable gains while on inpatient rehab is excellent Anticipated functional outcomes upon discharge from inpatient rehab are supervision  with PT, supervision with OT, supervision with SLP. Estimated rehab length of stay to reach the above functional goals is: 10-14 days Anticipated discharge destination: Home Overall Rehab/Functional Prognosis: excellent  POST ACUTE RECOMMENDATIONS: This patient's condition is appropriate for continued rehabilitative care in the following setting: CIR Patient has agreed to participate in recommended program. Yes Note that insurance prior authorization may be required for reimbursement for recommended care.    I have personally performed a face to face diagnostic evaluation of this patient. Additionally, I have examined the patient's medical record including any pertinent labs and radiographic images. If the physician assistant has documented in this note, I have reviewed and edited or otherwise concur with the physician assistant's documentation.  Thanks,  Izora Ribas, MD 03/02/2023

## 2023-03-02 NOTE — Progress Notes (Signed)
Occupational Therapy Treatment Patient Details Name: [name] MRN: [id number] DOB: 1941-03-01 Today's Date: 03/02/2023   History of present illness The pt is an 82 yo female presenting 2/29 after falling while getting out of her car. Imaging showed TBI/R SDH/contusion of R frontal lobe, and L clavicle fx. PMH includes: supranuclear palsy, BPV, HTN, GERD, CKD II, afib, HLD, mechanical aortic valve replacement on Coumadin therapy, and previous breast cancer.   OT comments  Completed family training with Husband and daughter in room. Therapist educated family regarding safety and recommended handling techniques when completing toileting, sit to stand transitions, toilet transfer, and short distance functional mobility. Education provided for gait belt use and handling technique. Daughter and Husband completed hands on training for sit to stand transitions, toileting and toilet transfer with therapist providing VC as needed for technique and form. Husband plans to be with patient 24/7. Daughter works during the day but can be there to assist at night. Husband and daughter will benefit from continued family training and hands on practice with assisting patient as they both do not feel 100% confident if pt was to discharge home today. Discharge recommendation changed after working with family and patient for CIR. Pt will have  the necessary family support when discharged.    Recommendations for follow up therapy are one component of a multi-disciplinary discharge planning process, led by the attending physician.  Recommendations may be updated based on patient status, additional functional criteria and insurance authorization.    Follow Up Recommendations  Acute inpatient rehab (3hours/day)     Assistance Recommended at Discharge Frequent or constant Supervision/Assistance  Patient can return home with the following  A lot of help with walking and/or transfers;A lot of help with  bathing/dressing/bathroom;Assistance with cooking/housework;Direct supervision/assist for medications management;Direct supervision/assist for financial management;Assist for transportation;Help with stairs or ramp for entrance   Equipment Recommendations  Tub/shower seat    Recommendations for Other Services Rehab consult    Precautions / Restrictions Precautions Precautions: Fall Precaution Comments: pt is a high fall risk, left clavicle fx Restrictions Weight Bearing Restrictions: Yes LUE Weight Bearing: Non weight bearing Other Position/Activity Restrictions: NWB in sling       Mobility Bed Mobility Overal bed mobility: Needs Assistance Bed Mobility: Sit to Supine     Supine to sit: Min assist       Patient Response: Cooperative  Transfers Overall transfer level: Needs assistance Equipment used: 1 person hand held assist Transfers: Sit to/from Stand, Bed to chair/wheelchair/BSC Sit to Stand: Mod assist     Step pivot transfers: Min assist     General transfer comment: Family provided with VC and visual demonstration on transfer technique including gait belt use, safety awareness, and trype of directional cues to provide. When completing a step pvt transfer from/to bed and [facility], daughter completed a face to face transfer technique. When completing a sit to stand and then walk to recliner, OT recommended standing to the right side of patient (due to LUE WB restrictions). Pt and daughter to hold hands on right and daughter's left hand to help faciliate trunk flexion to initiate sit<>stand.     Balance Overall balance assessment: Needs assistance Sitting-balance support: No upper extremity supported, Feet supported Sitting balance-Leahy Scale: Fair   Postural control: Posterior lean Standing balance support: Single extremity supported Standing balance-Leahy Scale: Poor     ADL either performed or assessed with clinical judgement   ADL Overall ADL's : Needs  assistance/impaired     Toilet  Transfer: BSC/3in1;Cueing for safety;Cueing for sequencing;Moderate assistance;Minimal assistance Toilet Transfer Details (indicate cue type and reason): step pvt, face to face transfer. Required physical assist to bring trunk forward when initiating sit to stand as well as when decending to bed surface. Once standing, pt required Min A for balance. Toileting- Clothing Manipulation and Hygiene: Moderate assistance;Cueing for safety;Cueing for sequencing;Sit to/from stand         General ADL Comments: Hands on family training completed with Husband and daughter regarding toileting and toilet transfer using BSC.                  General Comments Bruising and swelling noted around right right/forehead    Pertinent Vitals/ Pain       Pain Assessment Pain Assessment: Faces         Frequency  Min 2X/week        Progress Toward Goals  OT Goals(current goals can now be found in the care plan section)  Progress towards OT goals: Progressing toward goals     Plan Discharge plan needs to be updated;Frequency remains appropriate       AM-PAC OT "6 Clicks" Daily Activity     Outcome Measure   Help from another person eating meals?: A Little Help from another person taking care of personal grooming?: A Little Help from another person toileting, which includes using toliet, bedpan, or urinal?: A Little Help from another person bathing (including washing, rinsing, drying)?: A Lot Help from another person to put on and taking off regular upper body clothing?: A Lot Help from another person to put on and taking off regular lower body clothing?: A Lot 6 Click Score: 15    End of Session Equipment Utilized During Treatment: Gait belt  OT Visit Diagnosis: Unsteadiness on feet (R26.81);Other abnormalities of gait and mobility (R26.89);History of falling (Z91.81);Muscle weakness (generalized) (M62.81);Other symptoms and signs involving the nervous  system (R29.898);Other symptoms and signs involving cognitive function   Activity Tolerance Patient tolerated treatment well   Patient Left in bed;with call bell/phone within reach;with bed alarm set;with family/visitor present   Nurse Communication          Time: [id number] OT Time Calculation (min): 39 min  Charges: OT General Charges $OT Visit: 1 Visit OT Treatments $Self Care/Home Management : 38-52 mins  Ailene Ravel, OTR/L,CBIS  Supplemental OT - [facility] and [facility] Secure Chat Preferred    Cheyenne Bordeaux, Clarene Duke 03/02/2023, 10:43 AM

## 2023-03-02 NOTE — TOC Progression Note (Signed)
Transition of Care [facility]) - Progression Note    Patient Details  Name: [name] MRN: [id number] Date of Birth: 25-Nov-1941  Transition [facility]) CM/SW Contact  Oren Section Cleta Alberts, RN Phone Number: 03/02/2023, 2:48 PM  Clinical Narrative:    Noted discharge plan is now for inpatient rehab.  Insurance authorization has been initiated by [employer]. Notified [facility] of change in dc plan.  Will follow.    Expected Discharge Plan: IP Rehab Facility Barriers to Discharge: Continued Medical Work up  Expected Discharge Plan and Services   Discharge Planning Services: CM Consult Post Acute Care Choice: [place] arrangements for the past 2 months: Single Family Home                 DME Arranged: Shower stool   Date DME Agency Contacted: 03/01/23 Time DME Agency Contacted: [id number] Representative spoke with at DME Agency: Erasmo Downer HH Arranged: PT, OT HH Agency: [place] Date [place]: 03/01/23 Time [place]: 1300 Representative spoke with at [place]: [place] ([place]) Interventions SDOH Screenings   Tobacco Use: Low Risk  (01/26/2023)    Readmission Risk Interventions    02/11/2022   10:27 AM  Readmission Risk Prevention Plan  Transportation Screening Complete  PCP or Specialist Appt within 3-5 Days Complete  HRI or [place] Complete  Social Work Consult for [place] Planning/Counseling Complete  Palliative Care Screening Not Applicable  Medication Review (RN Care Manager) Complete  Reinaldo Raddle, RN, BSN  Trauma/Neuro ICU Case Manager [phone]

## 2023-03-02 NOTE — Progress Notes (Signed)
Inpatient Rehab Admissions Coordinator:    I met with pt. To discuss potential CIR admit. Husband was present. They are interested in CIR and husband can provide 24/7 min assist after d/c I will open case with Pt.'s insurance and pursue for admit.   Clemens Catholic, [place], [place] Admissions Coordinator  [phone] ([place]) [phone] (office)

## 2023-03-02 NOTE — Progress Notes (Signed)
Physical Therapy Treatment Patient Details Name: [name] MRN: [id number] DOB: [birth year] Today's Date: 03/02/2023   History of Present Illness The pt is an 82 yo female presenting 2/29 after falling while getting out of her car. Imaging showed TBI/R SDH/contusion of R frontal lobe, and L clavicle fx. PMH includes: supranuclear palsy, BPV, HTN, GERD, CKD II, afib, HLD, mechanical aortic valve replacement on Coumadin therapy, and previous breast cancer.    PT Comments    Session focused on family education with dtr, Debbie, and spouse on how to assist pt with bed mobility, transfers, and ambulation. This PT spent extensive amount of time with pt and spouse yesterday on proper and safe ways to assist pt with tasks above however pt with poor recall and unable to return demonstrate proper techniques he practiced yesterday today. Dtr present today and both pt and dtr assisted pt with max directional verbal cues and minA from PT. Pt overall min/modA with max directional verbal cues for all tasks at this time. Spouse will be available 24/7 and dtr will be spending the night with the patient. I highly recommend AIR upon d/c to allow for increased time to educate family on proper techniques with assisting pt with mobility and ADLs to keep both patient and spouse safe. AIR would allow focused time for pt and spouse to practice consistently and frequently to become efficient and safe for transition home. Acute PT to cont to follow.    Recommendations for follow up therapy are one component of a multi-disciplinary discharge planning process, led by the attending physician.  Recommendations may be updated based on patient status, additional functional criteria and insurance authorization.  Follow Up Recommendations  Acute inpatient rehab (3hours/day)     Assistance Recommended at Discharge Frequent or constant Supervision/Assistance  Patient can return home with the following A lot of help with  walking and/or transfers;A lot of help with bathing/dressing/bathroom;Assistance with cooking/housework;Direct supervision/assist for medications management;Direct supervision/assist for financial management;Assist for transportation;Help with stairs or ramp for entrance   Equipment Recommendations  None recommended by PT    Recommendations for Other Services       Precautions / Restrictions Precautions Precautions: Fall Precaution Comments: pt is a high fall risk, left clavicle fx Restrictions Weight Bearing Restrictions: Yes LUE Weight Bearing: Non weight bearing Other Position/Activity Restrictions: NWB in sling     Mobility  Bed Mobility Overal bed mobility: Needs Assistance Bed Mobility: Rolling, Sidelying to Sit Rolling: Min assist Sidelying to sit: Min assist       General bed mobility comments: worked with spouse and dtr on assisting pt with getting in/out of bed via log rolling to the R and pushing upwith R UE with contact cues at hips. Verbal cues to spouse to help direct and assist pt, dtr present. minA for trunk elevation with constant directional cues    Transfers Overall transfer level: Needs assistance Equipment used: 1 person hand held assist Transfers: Sit to/from Stand Sit to Stand: Mod assist           General transfer comment: modA to rise to standing with cues for use of UE and assist to steady as pt with posterior lean. worked extensively with spouse and dtr on proper hand placement on pt's bilat posterior hips with the gait belt and having pt holding onto spouses L UE with her R UE, keeping his R foot in between her feet when doing a face to face transfer to the patients R and then his L  foot between her two feet when transferring to the patient's L. Pt's dtr  returned demonstrated step pvt transfer from EOB to [facility] with directional cues from PT.    Ambulation/Gait Ambulation/Gait assistance: Mod assist, +2 safety/equipment (chair follow) Gait  Distance (Feet): 50 Feet (x2) Assistive device: 1 person hand held assist Gait Pattern/deviations: Step-to pattern, Decreased stride length, Narrow base of support Gait velocity: slow Gait velocity interpretation: <1.31 ft/sec, indicative of household ambulator   General Gait Details: had spouse amb with pt providing R HHA and use of hand around waist holding onto gait belt to provide stability for first 50', s/p rest break dtr then amb pt with same technique. verbal cues on proper hand placement and how to use whole body if needed to support pt. overall pt min/modA as pt unable to use RW due to L UE NWB   Stairs             Wheelchair Mobility    Modified Rankin (Stroke Patients Only) Modified Rankin (Stroke Patients Only) Pre-Morbid Rankin Score: Moderately severe disability Modified Rankin: Moderately severe disability     Balance Overall balance assessment: Needs assistance Sitting-balance support: No upper extremity supported, Feet supported Sitting balance-Leahy Scale: Fair   Postural control: Posterior lean Standing balance support: Single extremity supported Standing balance-Leahy Scale: Poor                              Cognition Arousal/Alertness: Awake/alert Behavior During Therapy: Flat affect Overall Cognitive Status: Impaired/Different from baseline Area of Impairment: Safety/judgement, Awareness, Problem solving, Memory, Following commands                     Memory: Decreased short-term memory Following Commands: Follows one step commands with increased time Safety/Judgement: Decreased awareness of deficits, Decreased awareness of safety Awareness: Intellectual Problem Solving: Decreased initiation, Requires verbal cues, Difficulty sequencing, Slow processing General Comments: Eyes remained close for most of session although able to open eyes when cued. Daughter reports pt is sensitive to light.        Exercises      General  Comments General comments (skin integrity, edema, etc.): bruising      Pertinent Vitals/Pain Pain Assessment Faces Pain Scale: Hurts a little bit Pain Location: R eye Pain Intervention(s): Monitored during session    Home Living                          Prior Function            PT Goals (current goals can now be found in the care plan section) Acute Rehab PT Goals Patient Stated Goal: return home PT Goal Formulation: With patient/family Time For Goal Achievement: 03/11/23 Potential to Achieve Goals: Good Progress towards PT goals: Progressing toward goals    Frequency    Min 4X/week      PT Plan Discharge plan needs to be updated    Co-evaluation              AM-PAC PT "6 Clicks" Mobility   Outcome Measure  Help needed turning from your back to your side while in a flat bed without using bedrails?: A Little Help needed moving from lying on your back to sitting on the side of a flat bed without using bedrails?: A Lot Help needed moving to and from a bed to a chair (including a wheelchair)?: A Lot Help needed  standing up from a chair using your arms (e.g., wheelchair or bedside chair)?: A Lot Help needed to walk in hospital room?: A Lot Help needed climbing 3-5 steps with a railing? : Total 6 Click Score: 12    End of Session Equipment Utilized During Treatment: Gait belt Activity Tolerance: Patient tolerated treatment well Patient left: Other (comment) (on BSC with OT and family) Nurse Communication: Mobility status PT Visit Diagnosis: Other abnormalities of gait and mobility (R26.89);Muscle weakness (generalized) (M62.81);Repeated falls (R29.6);History of falling (Z91.81)     Time: [id number] PT Time Calculation (min) (ACUTE ONLY): 62 min  Charges:  $Gait Training: 23-37 mins $Therapeutic Activity: 23-37 mins                     Kittie Plater, PT, DPT Acute Rehabilitation Services Secure chat preferred Office #: [phone]    Berline Lopes 03/02/2023, 10:06 AM

## 2023-03-02 NOTE — Progress Notes (Signed)
Inpatient Rehab Admissions Coordinator Note:   Per MD request patient was screened for CIR candidacy by Michel Santee, PT. At this time, pt appears to be a potential candidate for CIR. I will place an order for rehab consult for full assessment, per our protocol.  Please contact me any with questions.Shann Medal, PT, DPT [phone] 03/02/23 10:07 AM

## 2023-03-03 LAB — PROTIME-INR
INR: 2.3 — ABNORMAL HIGH (ref 0.8–1.2)
Prothrombin Time: 25.3 seconds — ABNORMAL HIGH (ref 11.4–15.2)

## 2023-03-03 MED ORDER — WARFARIN SODIUM 2.5 MG PO TABS
2.5000 mg | ORAL_TABLET | Freq: Once | ORAL | Status: AC
  Administered 2023-03-03: 2.5 mg via ORAL
  Filled 2023-03-03: qty 1

## 2023-03-03 NOTE — Progress Notes (Signed)
Physical Therapy Treatment Patient Details Name: [name] MRN: [id number] DOB: [birth year] Today's Date: 03/03/2023   History of Present Illness The pt is an 82 yo female presenting 2/29 after falling while getting out of her car. Imaging showed TBI/R SDH/contusion of R frontal lobe, and L clavicle fx. PMH includes: supranuclear palsy, BPV, HTN, GERD, CKD II, afib, HLD, mechanical aortic valve replacement on Coumadin therapy, and previous breast cancer.    PT Comments    The pt was agreeable to session, eager to progress mobility this afternoon. Pt presents with improved affect and alertness, spouse believes it is due to stopping anti-seizure meds. The pt was able to follow simple commands, more vocal in conversation at this time. Continues to demo significant deficits in dynamic stability which are exacerbated by inability to bear wt through LUE. The pt continues to need modA to manage hallway ambulation to correct scissoring steps and R drifting and LOB. Will continue efforts with progressing LE strength and stability. Recommendations remain appropriate at this time.     Recommendations for follow up therapy are one component of a multi-disciplinary discharge planning process, led by the attending physician.  Recommendations may be updated based on patient status, additional functional criteria and insurance authorization.  Follow Up Recommendations  Acute inpatient rehab (3hours/day)     Assistance Recommended at Discharge Frequent or constant Supervision/Assistance  Patient can return home with the following A lot of help with walking and/or transfers;A lot of help with bathing/dressing/bathroom;Assistance with cooking/housework;Direct supervision/assist for medications management;Direct supervision/assist for financial management;Assist for transportation;Help with stairs or ramp for entrance   Equipment Recommendations  None recommended by PT    Recommendations for Other  Services       Precautions / Restrictions Precautions Precautions: Fall Precaution Comments: pt is a high fall risk, left clavicle fx Restrictions Weight Bearing Restrictions: Yes LUE Weight Bearing: Non weight bearing Other Position/Activity Restrictions: NWB in sling     Mobility  Bed Mobility Overal bed mobility: Needs Assistance Bed Mobility: Supine to Sit     Supine to sit: HOB elevated, Min assist     General bed mobility comments: HOB elevated, pt able to come to sitting EOB with minA to legs and at trunk. increased time    Transfers Overall transfer level: Needs assistance Equipment used: 1 person hand held assist, Straight cane Transfers: Sit to/from Stand Sit to Stand: Mod assist   Step pivot transfers: Mod assist       General transfer comment: modA to rise to standing, posterior lean with modA to prevent posterior LOB. cues for hand placement and posture.    Ambulation/Gait Ambulation/Gait assistance: Mod assist, +2 safety/equipment (chair follow) Gait Distance (Feet): 75 Feet (+ 75 ft) Assistive device: 1 person hand held assist, Straight cane Gait Pattern/deviations: Step-to pattern, Decreased stride length, Narrow base of support Gait velocity: slow Gait velocity interpretation: <1.8 ft/sec, indicate of risk for recurrent falls   General Gait Details: short, shuffling steps with modA to correct LOB with scissoring steps and R drifting/LOB. standing rest break with cues for feet position and widening BOS, then able to continue    Modified Rankin (Stroke Patients Only) Modified Rankin (Stroke Patients Only) Pre-Morbid Rankin Score: Moderately severe disability Modified Rankin: Moderately severe disability     Balance Overall balance assessment: Needs assistance Sitting-balance support: No upper extremity supported, Feet supported Sitting balance-Leahy Scale: Fair   Postural control: Posterior lean Standing balance support: Single extremity  supported Standing balance-Leahy Scale: Poor  Cognition Arousal/Alertness: Awake/alert Behavior During Therapy: Flat affect Overall Cognitive Status: Impaired/Different from baseline Area of Impairment: Safety/judgement, Awareness, Problem solving, Memory, Following commands                     Memory: Decreased short-term memory Following Commands: Follows one step commands with increased time Safety/Judgement: Decreased awareness of deficits, Decreased awareness of safety Awareness: Intellectual Problem Solving: Decreased initiation, Requires verbal cues, Difficulty sequencing, Slow processing General Comments: pt more alert today, following commands, more vocal in conversation. Pt able to follow simple cues        Exercises      General Comments General comments (skin integrity, edema, etc.): VSS on RA      Pertinent Vitals/Pain Pain Assessment Pain Assessment: 0-10 Faces Pain Scale: Hurts a little bit Pain Location: R eye Pain Intervention(s): Monitored during session     PT Goals (current goals can now be found in the care plan section) Acute Rehab PT Goals Patient Stated Goal: return home PT Goal Formulation: With patient/family Time For Goal Achievement: 03/11/23 Potential to Achieve Goals: Good Progress towards PT goals: Progressing toward goals    Frequency    Min 4X/week      PT Plan Current plan remains appropriate   AM-PAC PT "6 Clicks" Mobility   Outcome Measure  Help needed turning from your back to your side while in a flat bed without using bedrails?: A Little Help needed moving from lying on your back to sitting on the side of a flat bed without using bedrails?: A Lot Help needed moving to and from a bed to a chair (including a wheelchair)?: A Lot Help needed standing up from a chair using your arms (e.g., wheelchair or bedside chair)?: A Lot Help needed to walk in hospital room?: A Lot Help  needed climbing 3-5 steps with a railing? : Total 6 Click Score: 12    End of Session Equipment Utilized During Treatment: Gait belt Activity Tolerance: Patient tolerated treatment well Patient left: in chair;with call bell/phone within reach;with chair alarm set;with family/visitor present Nurse Communication: Mobility status PT Visit Diagnosis: Other abnormalities of gait and mobility (R26.89);Muscle weakness (generalized) (M62.81);Repeated falls (R29.6);History of falling (Z91.81)     Time: [id number] PT Time Calculation (min) (ACUTE ONLY): 27 min  Charges:  $Gait Training: 8-22 mins $Therapeutic Exercise: 8-22 mins                     [place] Carbo, PT, DPT   Acute Rehabilitation Department Office [place] Communication Preferred   Sandra Cockayne 03/03/2023, 5:38 PM

## 2023-03-03 NOTE — Progress Notes (Signed)
ANTICOAGULATION CONSULT NOTE - Initial Consult  Pharmacy Consult for warfarin  Indication: atrial fibrillation and mechanical aortic valve   Allergies  Allergen Reactions   Demerol [Meperidine] Shortness Of Breath and Swelling   Oxycontin [Oxycodone] Other (See Comments)    Hallucinations    Adhesive [Tape] Other (See Comments)    Redness and skin tears   Cozaar [Losartan] Other (See Comments)    Dizziness    Fosamax [Alendronate] Other (See Comments)    Heartburn    Ivp Dye [Iodinated Contrast Media] Hives    OK with benadryl pre-med ('50mg'$  one hour before receiving iodinated contrast agent)   Phenergan [Promethazine] Other (See Comments)    Avoids due to reaction with current medications    Patient Measurements: Height: 5' (152.4 cm) Weight: 54 kg (119 lb 0.8 oz) IBW/kg (Calculated) : 45.5  Vital Signs: Temp: 98.7 F (37.1 C) (03/07 0754) Temp Source: Oral (03/07 0754) BP: 117/75 (03/07 0754) Pulse Rate: 56 (03/07 0754)  Labs: Recent Labs    03/01/23 0451 03/01/23 0910 03/02/23 0448 03/03/23 0452  LABPROT 29.4*  --  25.0* 25.3*  INR 2.8*  --  2.3* 2.3*  CREATININE  --  1.92*  --   --      Estimated Creatinine Clearance: 16.2 mL/min (A) (by C-G formula based on SCr of 1.92 mg/dL (H)).   Medical History: Past Medical History:  Diagnosis Date   BPV (benign positional vertigo)    Chronic neck pain    C2-3 arthropathy   CKD (chronic kidney disease), stage II    Diverticulosis of colon    GERD (gastroesophageal reflux disease)    Hiatal hernia    History of breast cancer per pt no recurrence   dx 2004-- Left breast DCIS (ER/PR+, HER2 negative) s/p  partial masectomy w/ sln bx and  chemoradiotion (completed 2004)   History of herpes zoster    History of kidney stones    HTN (hypertension)    Hyperlipidemia    Irritable bowel syndrome    LBBB (left bundle branch block)    PAF (paroxysmal atrial fibrillation) (HCC)    a. CHA2DS2VASc = 4-->coumadin.    Parkinsonism    neurologist-  dr tat   PONV (postoperative nausea and vomiting)    S/P aortic valve replacement with prosthetic valve    a. 04/13/2006 s/p St Jude mechnical AVR for severe AS;  b. 09/2014 Echo: EF 40-45%, gr1 DD, mild AI/MR, triv TR/PR.   Squamous cell carcinoma of skin 04/06/2022   right nasal sidewall   SUI (stress urinary incontinence, female)    Symptomatic anemia    Venous varices      Assessment: Patient admitted with SDH. On warfarin PTA, regimen is 2.'5mg'$  all days except '5mg'$  on Thursdays. Confirmed with husband at bedside. Neurosurgery okay with resuming anticoagulation.   CBC stable 3/4.   2/29: INR 2.4; missed warfarin dose.  3/1: INR 2.1; warfarin '5mg'$  x1 -patient's home simvastatin was restarted on 3/1 (interaction w/ warfarin, inc anticoagulant effect) 3/2: INR 2.0; warfarin '5mg'$  x1 3/3: INR 2.7; warfarin '1mg'$  x1 3/4: INR 2.9; warfarin '1mg'$  x1 3/5: INR 2.8; warfarin 2.5 mg x1 3/6: INR 2.3; warfarin 5 mg x1  3/7: INR 2.3; warfarin 2.5 mg x1   Goal of Therapy:  INR 2.5-3.5 (confirmed w/ husband at bedside)    Plan:  Warfarin 2.'5mg'$  PO x1 today @ 1600 Daily INR.   Wilson Singer, PharmD Clinical Pharmacist 03/03/2023 8:08 AM

## 2023-03-03 NOTE — Progress Notes (Signed)
Progress Note     Subjective: Having pain in head where sutures removed yesterday. Tired this am. Tolerating diet and BM yesterday. Husband is at bedside  Objective: Vital signs in last 24 hours: Temp:  [97.5 F (36.4 C)-98.7 F (37.1 C)] 98.7 F (37.1 C) (03/07 0754) Pulse Rate:  [48-56] 56 (03/07 0754) Resp:  [16-29] 22 (03/07 0754) BP: (103-138)/(5-83) 117/75 (03/07 0754) SpO2:  [96 %-100 %] 100 % (03/07 0754) Last BM Date : 03/02/23  Intake/Output from previous day: 03/06 0701 - 03/07 0700 In: 100 [P.O.:100] Out: 250 [Urine:250] Intake/Output this shift: No intake/output data recorded.  PE: General: pleasant, WD, elderly female who is laying in bed in NAD HEENT: facial ecchymosis with small hematoma to right temple with steri strips intact. EOMI Lungs: Respiratory effort nonlabored Abd: soft, NT, ND MS: LUE in sling Neuro: non focal exam  Psych: A&Ox4 with an appropriate affect.    Lab Results:  No results for input(s): "WBC", "HGB", "HCT", "PLT" in the last 72 hours.  BMET Recent Labs    03/01/23 0910  NA 136  K 3.9  CL 102  CO2 25  GLUCOSE 84  BUN 31*  CREATININE 1.92*  CALCIUM 8.6*    PT/INR Recent Labs    03/02/23 0448 03/03/23 0452  LABPROT 25.0* 25.3*  INR 2.3* 2.3*    CMP     Component Value Date/Time   NA 136 03/01/2023 0910   NA 141 08/10/2022 0917   K 3.9 03/01/2023 0910   CL 102 03/01/2023 0910   CO2 25 03/01/2023 0910   GLUCOSE 84 03/01/2023 0910   BUN 31 (H) 03/01/2023 0910   BUN 29 (H) 08/10/2022 0917   CREATININE 1.92 (H) 03/01/2023 0910   CALCIUM 8.6 (L) 03/01/2023 0910   PROT 4.7 (L) 02/25/2023 0650   PROT 6.1 08/10/2022 0917   ALBUMIN 2.4 (L) 02/25/2023 0650   ALBUMIN 3.6 (L) 08/10/2022 0917   AST 20 02/25/2023 0650   ALT 16 02/25/2023 0650   ALKPHOS 46 02/25/2023 0650   BILITOT 0.5 02/25/2023 0650   BILITOT 0.3 08/10/2022 0917   GFRNONAA 26 (L) 03/01/2023 0910   GFRAA 32 (L) 07/01/2020 0837   Lipase      Component Value Date/Time   LIPASE 27 02/05/2022 0629       Studies/Results: No results found.  Anti-infectives: Anti-infectives (From admission, onward)    None        Assessment/Plan  Fall on coumadin TBI/R SDH/Contusion of the R frontal lobe + vertex - NSGY consult. Dr. Ellene Route. No acute intervention. No need to reverse anticoagulation.  He is ok with what cardiology needs to do in regards to her anticoagulation. Repeat CTH stable 3/1. Keppra for seizure ppx. Dr. Grandville Silos d/w Dr. Ellene Route - OK to continue coumadin. TBI team therapies.  L clavicle fx - NWB per ortho, sling, f/u with Dr. Marcelino Scot 2 weeks  Scalp laceration - s/p repair 2/29, sutures removed 3/6. Ice prn Hx PAF Hx AVR on Coumadin - continue coumadin per pharmacy Hx BPV  Hx PSP - followed by Dr. Carles Collet with neurology.  Patient has significant fall history and is unable to mobilize on her own.  She requires assistive device and support of husband at ALL times. Per report they were weaning carbidopa-levodopa at home, continue here per pharmacy HX CKD2 - creatinine at baseline; adequate uop Hx HTN Hx HLD  FEN - reg diet, SLIV VTE - SCDs, Coumadin - INR 2.3 ID - None Foley -  foley removed 3/4, voiding    Dispo - 4NP. Therapies now recommending CIR - awaiting ins auth. Continue therapies with plan for CIR vs home with [facility] once ready from a mobility standpoint. Medically stable for CIR.    LOS: 7 days      Winferd Humphrey, [facility] 03/03/2023, 9:29 AM Please see Amion for pager number during day hours 7:00am-4:30pm

## 2023-03-04 LAB — PROTIME-INR
INR: 2.5 — ABNORMAL HIGH (ref 0.8–1.2)
Prothrombin Time: 26.7 seconds — ABNORMAL HIGH (ref 11.4–15.2)

## 2023-03-04 MED ORDER — METHOCARBAMOL 500 MG PO TABS
500.0000 mg | ORAL_TABLET | Freq: Three times a day (TID) | ORAL | Status: DC
  Administered 2023-03-04 – 2023-03-08 (×13): 500 mg via ORAL
  Filled 2023-03-04 (×13): qty 1

## 2023-03-04 MED ORDER — WARFARIN SODIUM 2.5 MG PO TABS
2.5000 mg | ORAL_TABLET | Freq: Once | ORAL | Status: AC
  Administered 2023-03-04: 2.5 mg via ORAL
  Filled 2023-03-04: qty 1

## 2023-03-04 MED ORDER — ENSURE ENLIVE PO LIQD
237.0000 mL | Freq: Two times a day (BID) | ORAL | Status: DC
  Administered 2023-03-04 – 2023-03-08 (×10): 237 mL via ORAL

## 2023-03-04 MED ORDER — TRAMADOL HCL 50 MG PO TABS
50.0000 mg | ORAL_TABLET | Freq: Four times a day (QID) | ORAL | Status: DC | PRN
  Administered 2023-03-04 – 2023-03-08 (×12): 50 mg via ORAL
  Filled 2023-03-04 (×13): qty 1

## 2023-03-04 MED ORDER — METHOCARBAMOL 1000 MG/10ML IJ SOLN
500.0000 mg | Freq: Three times a day (TID) | INTRAVENOUS | Status: DC
  Filled 2023-03-04: qty 5

## 2023-03-04 NOTE — Care Management Important Message (Signed)
Important Message  Patient Details  Name: [name] MRN: [id number] Date of Birth: Sep 24, 1941   Medicare Important Message Given:  Yes     Hannah Beat 03/04/2023, 12:21 PM

## 2023-03-04 NOTE — Progress Notes (Signed)
Patient ID: [name], female   DOB: 1941/03/21, 82 y.o.   MRN: [id number] [facility] Progress Note     Subjective: CC-  Daughter at bedside. Patient getting up to bedside commode. Daughter states that she has been complaining of more of a headache the last couple days. Previously all she needed was tylenol but that is no longer helping as much. She is tolerating some tramadol, but other narcotics have caused her to hallucinate in the past. Otherwise no new complaints.  Objective: Vital signs in last 24 hours: Temp:  [97.9 F (36.6 C)-98.6 F (37 C)] 98.6 F (37 C) (03/08 0753) Pulse Rate:  [51-68] 54 (03/08 0753) Resp:  [18-30] 19 (03/08 0753) BP: (99-159)/(48-62) 100/48 (03/08 0753) SpO2:  [96 %-100 %] 100 % (03/08 0753) Last BM Date : 03/03/23  Intake/Output from previous day: 03/07 0701 - 03/08 0700 In: 240 [P.O.:240] Out: -  Intake/Output this shift: No intake/output data recorded.   PE: General: pleasant, WD, elderly female who is laying in bed in NAD HEENT: facial ecchymosis with small hematoma to right temple with steri strips intact. EOMI Lungs: CTAB, Respiratory effort nonlabored Abd: soft, NT, ND MS: LUE in sling Neuro: non focal exam   Lab Results:  No results for input(s): "WBC", "HGB", "HCT", "PLT" in the last 72 hours. BMET Recent Labs    03/01/23 0910  NA 136  K 3.9  CL 102  CO2 25  GLUCOSE 84  BUN 31*  CREATININE 1.92*  CALCIUM 8.6*   PT/INR Recent Labs    03/03/23 0452 03/04/23 0650  LABPROT 25.3* 26.7*  INR 2.3* 2.5*   CMP     Component Value Date/Time   NA 136 03/01/2023 0910   NA 141 08/10/2022 0917   K 3.9 03/01/2023 0910   CL 102 03/01/2023 0910   CO2 25 03/01/2023 0910   GLUCOSE 84 03/01/2023 0910   BUN 31 (H) 03/01/2023 0910   BUN 29 (H) 08/10/2022 0917   CREATININE 1.92 (H) 03/01/2023 0910   CALCIUM 8.6 (L) 03/01/2023 0910   PROT 4.7 (L) 02/25/2023 0650   PROT 6.1 08/10/2022 0917   ALBUMIN 2.4  (L) 02/25/2023 0650   ALBUMIN 3.6 (L) 08/10/2022 0917   AST 20 02/25/2023 0650   ALT 16 02/25/2023 0650   ALKPHOS 46 02/25/2023 0650   BILITOT 0.5 02/25/2023 0650   BILITOT 0.3 08/10/2022 0917   GFRNONAA 26 (L) 03/01/2023 0910   GFRAA 32 (L) 07/01/2020 0837   Lipase     Component Value Date/Time   LIPASE 27 02/05/2022 0629       Studies/Results: No results found.  Anti-infectives: Anti-infectives (From admission, onward)    None        Assessment/Plan Fall on coumadin TBI/R SDH/Contusion of the R frontal lobe + vertex - NSGY consult. Dr. Ellene Route. No acute intervention. No need to reverse anticoagulation.  He is ok with what cardiology needs to do in regards to her anticoagulation. Repeat CTH stable 3/1. Keppra for seizure ppx. Dr. Grandville Silos d/w Dr. Ellene Route - OK to continue coumadin. TBI team therapies.  L clavicle fx - NWB per ortho, sling, f/u with Dr. Marcelino Scot 2 weeks  Scalp laceration - s/p repair 2/29, sutures removed 3/6. Ice prn Hx PAF Hx AVR on Coumadin - continue coumadin per pharmacy Hx BPV  Hx PSP - followed by Dr. Carles Collet with neurology.  Patient has significant fall history and is unable to mobilize on her own.  She requires assistive  device and support of husband at ALL times. Per report they were weaning carbidopa-levodopa at home, continue here per pharmacy HX CKD2 - creatinine elevated at baseline; adequate uop Hx HTN Hx HLD   FEN - reg diet, SLIV VTE - SCDs, Coumadin - INR 2.5 ID - None Foley - foley removed 3/4, voiding     Dispo - 4NP. Schedule robaxin for improved pain control. Therapies now recommending CIR - awaiting ins auth. Medically stable for CIR.   I reviewed last 24 h vitals and pain scores, last 48 h intake and output, and last 24 h labs and trends.    LOS: 8 days    [facility] 03/04/2023, 7:57 AM Please see Amion for pager number during day hours 7:00am-4:30pm

## 2023-03-04 NOTE — Progress Notes (Signed)
Occupational Therapy Treatment Patient Details Name: [name] MRN: [id number] DOB: 08-27-1941 Today's Date: 03/04/2023   History of present illness The pt is an 82 yo female presenting 2/29 after falling while getting out of her car. Imaging showed TBI/R SDH/contusion of R frontal lobe, and L clavicle fx. PMH includes: supranuclear palsy, BPV, HTN, GERD, CKD II, afib, HLD, mechanical aortic valve replacement on Coumadin therapy, and previous breast cancer.   OT comments  Continuous family training completed with husband during session. Provided education regarding UE sling and proper positioning with handout provided. Husband completed functional transfer from bed to recliner with gait belt appropriately donned. Husband provided pt with VC on sequencing and direction with pt performing a lateral set transfer safely to recliner. Pt encouraged to sit up in recliner to eat breakfast/all meals as much as possible to prevent choking. Husband educated to let nursing know when he would like to assist pt back to bed so they may assist him with line management. Husband verbalized understanding.    Recommendations for follow up therapy are one component of a multi-disciplinary discharge planning process, led by the attending physician.  Recommendations may be updated based on patient status, additional functional criteria and insurance authorization.    Follow Up Recommendations  Acute inpatient rehab (3hours/day)     Assistance Recommended at Discharge Frequent or constant Supervision/Assistance  Patient can return home with the following  A lot of help with walking and/or transfers;A lot of help with bathing/dressing/bathroom;Assistance with cooking/housework;Direct supervision/assist for medications management;Direct supervision/assist for financial management;Assist for transportation;Help with stairs or ramp for entrance   Equipment Recommendations  Tub/shower seat       Precautions /  Restrictions Precautions Precautions: Fall Precaution Comments: pt is a high fall risk, left clavicle fx Restrictions Weight Bearing Restrictions: Yes LUE Weight Bearing: Non weight bearing Other Position/Activity Restrictions: NWB in sling       Mobility Bed Mobility Overal bed mobility: Needs Assistance Bed Mobility: Supine to Sit     Supine to sit: HOB elevated, Min assist (husband assisted)          Transfers Overall transfer level: Needs assistance Equipment used: 1 person hand held assist Transfers: Sit to/from Stand, Bed to chair/wheelchair/BSC (Husband assisted pt for ongoing family training.)     Balance Overall balance assessment: Needs assistance Sitting-balance support: No upper extremity supported, Feet supported Sitting balance-Leahy Scale: Fair Sitting balance - Comments: sitting EOB   Standing balance support: Single extremity supported Standing balance-Leahy Scale: Poor       ADL either performed or assessed with clinical judgement   ADL   Eating/Feeding: Minimal assistance;With caregiver independent assisting Eating/Feeding Details (indicate cue type and reason): Husband feeding pt upon therapy arrival.                Cognition Arousal/Alertness: Awake/alert Behavior During Therapy: Flat affect Overall Cognitive Status: Impaired/Different from baseline Area of Impairment: Safety/judgement, Awareness, Problem solving, Memory, Following commands     Memory: Decreased short-term memory Following Commands: Follows one step commands with increased time Safety/Judgement: Decreased awareness of deficits, Decreased awareness of safety   Problem Solving: Decreased initiation, Requires verbal cues, Difficulty sequencing, Slow processing, Requires tactile cues          Exercises Other Exercises Other Exercises: Patien/family education: Provided verbal instructions and visual demonstration to husband regarding proper UE positioning in sling  with handout provided. Husband verbalized understanding.            Pertinent Vitals/ Pain  Pain Assessment Pain Assessment: Faces Faces Pain Scale: No hurt         Frequency  Min 2X/week        Progress Toward Goals  OT Goals(current goals can now be found in the care plan section)  Progress towards OT goals: Progressing toward goals     Plan Discharge plan needs to be updated;Frequency remains appropriate       AM-PAC OT "6 Clicks" Daily Activity     Outcome Measure   Help from another person eating meals?: A Little Help from another person taking care of personal grooming?: A Little Help from another person toileting, which includes using toliet, bedpan, or urinal?: A Little Help from another person bathing (including washing, rinsing, drying)?: A Lot Help from another person to put on and taking off regular upper body clothing?: A Lot Help from another person to put on and taking off regular lower body clothing?: A Lot 6 Click Score: 15    End of Session Equipment Utilized During Treatment: Gait belt  OT Visit Diagnosis: Unsteadiness on feet (R26.81);Other abnormalities of gait and mobility (R26.89);History of falling (Z91.81);Muscle weakness (generalized) (M62.81);Other symptoms and signs involving the nervous system (R29.898);Other symptoms and signs involving cognitive function   Activity Tolerance Patient tolerated treatment well   Patient Left in chair;with call bell/phone within reach;with family/visitor present;with nursing/sitter in room           Time: 1156-1210 OT Time Calculation (min): 14 min  Charges: OT General Charges $OT Visit: 1 Visit OT Treatments $Therapeutic Activity: 8-22 mins  Ailene Ravel, OTR/L,CBIS  Supplemental OT - [facility] and [facility] Secure Chat Preferred    Vanessia Bokhari, Clarene Duke 03/04/2023, 1:12 PM

## 2023-03-04 NOTE — Progress Notes (Signed)
Inpatient Rehab Admissions Coordinator:  Continue to await insurance authorization approval and bed availability. Will continue to follow.   Gayland Curry, [place], [place] Admissions Coordinator [phone]

## 2023-03-04 NOTE — Progress Notes (Signed)
ANTICOAGULATION CONSULT NOTE - Initial Consult  Pharmacy Consult for warfarin  Indication: atrial fibrillation and mechanical aortic valve   Allergies  Allergen Reactions   Demerol [Meperidine] Shortness Of Breath and Swelling   Oxycontin [Oxycodone] Other (See Comments)    Hallucinations    Adhesive [Tape] Other (See Comments)    Redness and skin tears   Cozaar [Losartan] Other (See Comments)    Dizziness    Fosamax [Alendronate] Other (See Comments)    Heartburn    Ivp Dye [Iodinated Contrast Media] Hives    OK with benadryl pre-med ('50mg'$  one hour before receiving iodinated contrast agent)   Phenergan [Promethazine] Other (See Comments)    Avoids due to reaction with current medications    Patient Measurements: Height: 5' (152.4 cm) Weight: 54 kg (119 lb 0.8 oz) IBW/kg (Calculated) : 45.5  Vital Signs: Temp: 98.6 F (37 C) (03/08 0753) Temp Source: Oral (03/08 0753) BP: 100/48 (03/08 0753) Pulse Rate: 54 (03/08 0753)  Labs: Recent Labs    03/01/23 0910 03/02/23 0448 03/03/23 0452 03/04/23 0650  LABPROT  --  25.0* 25.3* 26.7*  INR  --  2.3* 2.3* 2.5*  CREATININE 1.92*  --   --   --      Estimated Creatinine Clearance: 16.2 mL/min (A) (by C-G formula based on SCr of 1.92 mg/dL (H)).   Medical History: Past Medical History:  Diagnosis Date   BPV (benign positional vertigo)    Chronic neck pain    C2-3 arthropathy   CKD (chronic kidney disease), stage II    Diverticulosis of colon    GERD (gastroesophageal reflux disease)    Hiatal hernia    History of breast cancer per pt no recurrence   dx 2004-- Left breast DCIS (ER/PR+, HER2 negative) s/p  partial masectomy w/ sln bx and  chemoradiotion (completed 2004)   History of herpes zoster    History of kidney stones    HTN (hypertension)    Hyperlipidemia    Irritable bowel syndrome    LBBB (left bundle branch block)    PAF (paroxysmal atrial fibrillation) (HCC)    a. CHA2DS2VASc = 4-->coumadin.    Parkinsonism    neurologist-  dr tat   PONV (postoperative nausea and vomiting)    S/P aortic valve replacement with prosthetic valve    a. 04/13/2006 s/p [employer] mechnical AVR for severe AS;  b. 09/2014 Echo: EF 40-45%, gr1 DD, mild AI/MR, triv TR/PR.   Squamous cell carcinoma of skin 04/06/2022   right nasal sidewall   SUI (stress urinary incontinence, female)    Symptomatic anemia    Venous varices      Assessment: Patient admitted with SDH. On warfarin PTA, regimen is 2.'5mg'$  all days except '5mg'$  on Thursdays. Confirmed with husband at bedside. Neurosurgery okay with resuming anticoagulation.   CBC stable 3/4.   2/29: INR 2.4; missed warfarin dose.  3/1: INR 2.1; warfarin '5mg'$  x1 -patient's home simvastatin was restarted on 3/1 (interaction w/ warfarin, inc anticoagulant effect) 3/2: INR 2.0; warfarin '5mg'$  x1 3/3: INR 2.7; warfarin '1mg'$  x1 3/4: INR 2.9; warfarin '1mg'$  x1 3/5: INR 2.8; warfarin 2.5 mg x1 3/6: INR 2.3; warfarin 5 mg x1  3/7: INR 2.3; warfarin 2.5 mg x1  3/8: INR 2.5;   Goal of Therapy:  INR 2.5-3.5 (confirmed w/ husband at bedside)    Plan:  Warfarin 2.'5mg'$  PO x1 today @ 1600 -to target PTA weekly dose of '20mg'$ /week  Daily INR.   Wilson Singer, PharmD  Clinical Pharmacist 03/04/2023 7:55 AM

## 2023-03-05 ENCOUNTER — Inpatient Hospital Stay (HOSPITAL_COMMUNITY): Payer: Medicare HMO

## 2023-03-05 LAB — PROTIME-INR
INR: 2.4 — ABNORMAL HIGH (ref 0.8–1.2)
Prothrombin Time: 26.2 seconds — ABNORMAL HIGH (ref 11.4–15.2)

## 2023-03-05 MED ORDER — WARFARIN SODIUM 3 MG PO TABS
3.5000 mg | ORAL_TABLET | Freq: Once | ORAL | Status: AC
  Administered 2023-03-05: 3.5 mg via ORAL
  Filled 2023-03-05: qty 1

## 2023-03-05 NOTE — Progress Notes (Signed)
Patient ID: [name], female   DOB: [birth year], 82 y.o.   MRN: [id number] [facility] Progress Note     Subjective: CC-  Husband at bedside. Restless night due to ongoing headache, which she says is posterior but gestures to her right remple  Objective: Vital signs in last 24 hours: Temp:  [97.8 F (36.6 C)-98.7 F (37.1 C)] 98 F (36.7 C) (03/09 0900) Pulse Rate:  [53-87] 53 (03/09 0900) Resp:  [18-35] 20 (03/09 0900) BP: (122-134)/(47-66) 128/50 (03/09 0900) SpO2:  [90 %-98 %] 92 % (03/09 0900) Last BM Date : 03/04/23  Intake/Output from previous day: 03/08 0701 - 03/09 0700 In: 300 [P.O.:300] Out: -  Intake/Output this shift: No intake/output data recorded.   PE: General: pleasant, WD, elderly female who is laying in bed in NAD HEENT: facial ecchymosis with small hematoma to right temple with steri strips intact. EOMI Lungs: CTAB, Respiratory effort nonlabored Abd: soft, NT, ND MS: LUE in sling Neuro: non focal exam   Lab Results:  No results for input(s): "WBC", "HGB", "HCT", "PLT" in the last 72 hours. BMET No results for input(s): "NA", "K", "CL", "CO2", "GLUCOSE", "BUN", "CREATININE", "CALCIUM" in the last 72 hours.  PT/INR Recent Labs    03/04/23 0650 03/05/23 0505  LABPROT 26.7* 26.2*  INR 2.5* 2.4*    CMP     Component Value Date/Time   NA 136 03/01/2023 0910   NA 141 08/10/2022 0917   K 3.9 03/01/2023 0910   CL 102 03/01/2023 0910   CO2 25 03/01/2023 0910   GLUCOSE 84 03/01/2023 0910   BUN 31 (H) 03/01/2023 0910   BUN 29 (H) 08/10/2022 0917   CREATININE 1.92 (H) 03/01/2023 0910   CALCIUM 8.6 (L) 03/01/2023 0910   PROT 4.7 (L) 02/25/2023 0650   PROT 6.1 08/10/2022 0917   ALBUMIN 2.4 (L) 02/25/2023 0650   ALBUMIN 3.6 (L) 08/10/2022 0917   AST 20 02/25/2023 0650   ALT 16 02/25/2023 0650   ALKPHOS 46 02/25/2023 0650   BILITOT 0.5 02/25/2023 0650   BILITOT 0.3 08/10/2022 0917   GFRNONAA 26 (L) 03/01/2023 0910    GFRAA 32 (L) 07/01/2020 0837   Lipase     Component Value Date/Time   LIPASE 27 02/05/2022 0629       Studies/Results: No results found.  Anti-infectives: Anti-infectives (From admission, onward)    None        Assessment/Plan Fall on coumadin TBI/R SDH/Contusion of the R frontal lobe + vertex - NSGY consult. Dr. Ellene Route. No acute intervention. No need to reverse anticoagulation.  He is ok with what cardiology needs to do in regards to her anticoagulation. Repeat CTH stable 3/1. Keppra for seizure ppx. Dr. Grandville Silos d/w Dr. Ellene Route - OK to continue coumadin. TBI team therapies.  L clavicle fx - NWB per ortho, sling, f/u with Dr. Marcelino Scot 2 weeks  Scalp laceration - s/p repair 2/29, sutures removed 3/6. Ice prn Hx PAF Hx AVR on Coumadin - continue coumadin per pharmacy Hx BPV  Hx PSP - followed by Dr. Carles Collet with neurology.  Patient has significant fall history and is unable to mobilize on her own.  She requires assistive device and support of husband at ALL times. Per report they were weaning carbidopa-levodopa at home, continue here per pharmacy HX CKD2 - creatinine elevated at baseline; adequate uop Hx HTN Hx HLD   FEN - reg diet, SLIV VTE - SCDs, Coumadin - INR 2.5 ID - None Foley - foley  removed 3/4, voiding     Dispo - 4NP. Repeat CT head to rule out any progression og TBI contributing to headache. Medically stable for CIR. - awaiting ins auth.  I reviewed last 24 h vitals and pain scores, last 48 h intake and output, and last 24 h labs and trends.    LOS: 9 days    [name], [facility] 03/05/2023, 10:09 AM Please see Amion for pager number during day hours 7:00am-4:30pm

## 2023-03-05 NOTE — Progress Notes (Signed)
Patients hematoma was bleeding superficially I put a surface suture and piece of gauze on there to see if it slows down. I also paged trauma nurse. The night nurse is aware and will continue to monitor.

## 2023-03-05 NOTE — Progress Notes (Signed)
[place] for warfarin  Indication: atrial fibrillation and mechanical aortic valve   Allergies  Allergen Reactions   Demerol [Meperidine] Shortness Of Breath and Swelling   Oxycontin [Oxycodone] Other (See Comments)    Hallucinations    Adhesive [Tape] Other (See Comments)    Redness and skin tears   Cozaar [Losartan] Other (See Comments)    Dizziness    Fosamax [Alendronate] Other (See Comments)    Heartburn    Ivp Dye [Iodinated Contrast Media] Hives    OK with benadryl pre-med ('50mg'$  one hour before receiving iodinated contrast agent)   Phenergan [Promethazine] Other (See Comments)    Avoids due to reaction with current medications    Patient Measurements: Height: 5' (152.4 cm) Weight: 54 kg (119 lb 0.8 oz) IBW/kg (Calculated) : 45.5  Vital Signs: Temp: 98.3 F (36.8 C) (03/09 1102) Temp Source: Oral (03/09 1102) BP: 124/64 (03/09 1102) Pulse Rate: 55 (03/09 1102)  Labs: Recent Labs    03/03/23 0452 03/04/23 0650 03/05/23 0505  LABPROT 25.3* 26.7* 26.2*  INR 2.3* 2.5* 2.4*     Estimated Creatinine Clearance: 16.2 mL/min (A) (by C-G formula based on SCr of 1.92 mg/dL (H)).   Assessment: 5 YOF admitted with SDH.  Patient was on Coumadin 2.'5mg'$  all days except '5mg'$  on Thursdays PTA for history of Afib and mechanical AVR.  Neurosurgery okay with resuming anticoagulation.   INR slightly below goal at 2.4; no bleeding reported.  CT negative for new intracranial hemorrhage, increased hematoma along R frontal scalp.  OK to continue Coumadin per discussion with MD.  Goal of Therapy:  INR 2.5-3.5 (confirmed w/ husband at bedside)    Plan:  Coumadin 3.'5mg'$  PO today Daily PT / INR  Jeania Nater D. Mina Marble, PharmD, BCPS, [place] 03/05/2023, 2:08 PM

## 2023-03-05 NOTE — Progress Notes (Signed)
Patient with ST elevation alarms on monitor. This RN obtained an EKG, which is uploaded in the chart. Patient not complaining of any chest pain or discomfort. VSS. On call trauma MD notified.

## 2023-03-06 LAB — PROTIME-INR
INR: 2.5 — ABNORMAL HIGH (ref 0.8–1.2)
Prothrombin Time: 26.9 seconds — ABNORMAL HIGH (ref 11.4–15.2)

## 2023-03-06 MED ORDER — WARFARIN SODIUM 3 MG PO TABS
3.0000 mg | ORAL_TABLET | Freq: Once | ORAL | Status: AC
  Administered 2023-03-06: 3 mg via ORAL
  Filled 2023-03-06: qty 1

## 2023-03-06 MED ORDER — ACETAMINOPHEN 500 MG PO TABS
1000.0000 mg | ORAL_TABLET | Freq: Four times a day (QID) | ORAL | Status: DC
  Administered 2023-03-06 – 2023-03-08 (×8): 1000 mg via ORAL
  Filled 2023-03-06 (×10): qty 2

## 2023-03-06 MED ORDER — FENTANYL CITRATE PF 50 MCG/ML IJ SOSY
12.5000 ug | PREFILLED_SYRINGE | Freq: Once | INTRAMUSCULAR | Status: AC | PRN
  Administered 2023-03-06: 12.5 ug via INTRAVENOUS
  Filled 2023-03-06: qty 1

## 2023-03-06 NOTE — Progress Notes (Signed)
[place] for warfarin  Indication: atrial fibrillation and mechanical aortic valve   Allergies  Allergen Reactions   Demerol [Meperidine] Shortness Of Breath and Swelling   Oxycontin [Oxycodone] Other (See Comments)    Hallucinations    Adhesive [Tape] Other (See Comments)    Redness and skin tears   Cozaar [Losartan] Other (See Comments)    Dizziness    Fosamax [Alendronate] Other (See Comments)    Heartburn    Ivp Dye [Iodinated Contrast Media] Hives    OK with benadryl pre-med ('50mg'$  one hour before receiving iodinated contrast agent)   Phenergan [Promethazine] Other (See Comments)    Avoids due to reaction with current medications    Patient Measurements: Height: 5' (152.4 cm) Weight: 54 kg (119 lb 0.8 oz) IBW/kg (Calculated) : 45.5  Vital Signs: Temp: 97.7 F (36.5 C) (03/10 0340) Temp Source: Oral (03/10 0340) BP: 132/59 (03/10 0340) Pulse Rate: 57 (03/10 0340)  Labs: Recent Labs    03/04/23 0650 03/05/23 0505 03/06/23 0633  LABPROT 26.7* 26.2* 26.9*  INR 2.5* 2.4* 2.5*     Estimated Creatinine Clearance: 16.2 mL/min (A) (by C-G formula based on SCr of 1.92 mg/dL (H)).   Assessment: 44 YOF admitted with SDH.  Patient was on Coumadin 2.'5mg'$  all days except '5mg'$  on Thursdays PTA for history of Afib and mechanical AVR.  Neurosurgery okay with resuming anticoagulation.   3/9 CT negative for new intracranial hemorrhage, increased hematoma along R frontal scalp.  OK to continue Coumadin per discussion with MD.  INR increased to therapeutic level at 2.5.  Noted documentation of bleeding from R temple hematoma overnight.  Goal of Therapy:  INR 2.5-3.5 (confirmed w/ husband at bedside)    Plan:  Coumadin '3mg'$  PO today Daily PT / INR Monitor for worsening bleeding vs resolution  Lovie Zarling D. Mina Marble, PharmD, BCPS, [place] 03/06/2023, 10:31 AM

## 2023-03-06 NOTE — Progress Notes (Signed)
Patient ID: [name], female   DOB: [birth year], 82 y.o.   MRN: [id number] [facility] Progress Note     Subjective: CC-  Husband at bedside. Still c/o headache. Some bleeding from R temple hematoma overnight  Objective: Vital signs in last 24 hours: Temp:  [97.7 F (36.5 C)-98.9 F (37.2 C)] 97.7 F (36.5 C) (03/10 0340) Pulse Rate:  [55-84] 57 (03/10 0340) Resp:  [17-29] 18 (03/10 0340) BP: (124-143)/(51-70) 132/59 (03/10 0340) SpO2:  [90 %-96 %] 94 % (03/10 0340) Last BM Date : 03/05/23  Intake/Output from previous day: No intake/output data recorded. Intake/Output this shift: No intake/output data recorded.   PE: General: pleasant, WD, elderly female who is laying in bed in NAD HEENT: facial ecchymosis with small hematoma to right temple with steri strips intact, adherent piece of gauze and dried blood. No infection or active bleeding. EOMI Lungs: CTAB, Respiratory effort nonlabored Abd: soft, NT, ND MS: LUE in sling Neuro: non focal exam   Lab Results:  No results for input(s): "WBC", "HGB", "HCT", "PLT" in the last 72 hours. BMET No results for input(s): "NA", "K", "CL", "CO2", "GLUCOSE", "BUN", "CREATININE", "CALCIUM" in the last 72 hours.  PT/INR Recent Labs    03/05/23 0505 03/06/23 0633  LABPROT 26.2* 26.9*  INR 2.4* 2.5*    CMP     Component Value Date/Time   NA 136 03/01/2023 0910   NA 141 08/10/2022 0917   K 3.9 03/01/2023 0910   CL 102 03/01/2023 0910   CO2 25 03/01/2023 0910   GLUCOSE 84 03/01/2023 0910   BUN 31 (H) 03/01/2023 0910   BUN 29 (H) 08/10/2022 0917   CREATININE 1.92 (H) 03/01/2023 0910   CALCIUM 8.6 (L) 03/01/2023 0910   PROT 4.7 (L) 02/25/2023 0650   PROT 6.1 08/10/2022 0917   ALBUMIN 2.4 (L) 02/25/2023 0650   ALBUMIN 3.6 (L) 08/10/2022 0917   AST 20 02/25/2023 0650   ALT 16 02/25/2023 0650   ALKPHOS 46 02/25/2023 0650   BILITOT 0.5 02/25/2023 0650   BILITOT 0.3 08/10/2022 0917   GFRNONAA 26 (L)  03/01/2023 0910   GFRAA 32 (L) 07/01/2020 0837   Lipase     Component Value Date/Time   LIPASE 27 02/05/2022 0629       Studies/Results: CT HEAD WO CONTRAST (5MM)  Result Date: 03/05/2023 CLINICAL DATA:  Head trauma with persistent headache. EXAM: CT HEAD WITHOUT CONTRAST TECHNIQUE: Contiguous axial images were obtained from the base of the skull through the vertex without intravenous contrast. RADIATION DOSE REDUCTION: This exam was performed according to the departmental dose-optimization program which includes automated exposure control, adjustment of the mA and/or kV according to patient size and/or use of iterative reconstruction technique. COMPARISON:  Head CT 02/25/2023. FINDINGS: Brain: Interval resolution of previously seen right frontotemporal subdural collection and decreased conspicuity of the previously seen hemorrhagic contusions along the right superior frontal gyrus (for example, axial image 29 series 3). No new intracranial hemorrhage. Unchanged mild chronic small-vessel disease. No mass effect or midline shift. No hydrocephalus or extra-axial collection. Vascular: No hyperdense vessel or unexpected calcification. Skull: No calvarial fracture or suspicious bone lesion. Skull base is unremarkable. Sinuses/Orbits: Unremarkable. Other: Slightly increased hematoma along the right frontal scalp, now measuring up to 17 x 12 mm. IMPRESSION: 1. Interval resolution of previously seen right frontotemporal subdural collection and decreased conspicuity of the previously seen hemorrhagic contusions along the right superior frontal gyrus. No new intracranial hemorrhage. 2. Slightly increased hematoma along  the right frontal scalp, now measuring up to 17 x 12 mm. Electronically Signed   By: Emmit Alexanders M.D.   On: 03/05/2023 12:14    Anti-infectives: Anti-infectives (From admission, onward)    None        Assessment/Plan Fall on coumadin TBI/R SDH/Contusion of the R frontal lobe +  vertex - NSGY consult. Dr. Ellene Route. No acute intervention. No need to reverse anticoagulation.  He is ok with what cardiology needs to do in regards to her anticoagulation. Repeat CTH stable 3/1. Keppra for seizure ppx. Dr. Grandville Silos d/w Dr. Ellene Route - OK to continue coumadin. TBI team therapies. Rpt Ct Head 3/9 for headache negative for worsening TBI L clavicle fx - NWB per ortho, sling, f/u with Dr. Marcelino Scot 2 weeks  Scalp laceration - s/p repair 2/29, sutures removed 3/6. Ice prn Hx PAF Hx AVR on Coumadin - continue coumadin per pharmacy Hx BPV  Hx PSP - followed by Dr. Carles Collet with neurology.  Patient has significant fall history and is unable to mobilize on her own.  She requires assistive device and support of husband at ALL times. Per report they were weaning carbidopa-levodopa at home, continue here per pharmacy HX CKD2 - creatinine elevated at baseline; adequate uop Hx HTN Hx HLD   FEN - reg diet, SLIV VTE - SCDs, Coumadin - INR 2.5 ID - None Foley - foley removed 3/4, voiding     Dispo - 4NP. Increase tylenol, one time fentanyl 12.8mg prn ordered to see if this will help her headache. Medically stable for CIR. - awaiting ins auth.  I reviewed last 24 h vitals and pain scores, last 48 h intake and output, and last 24 h labs and trends.    LOS: 10 days    CClovis Riley [facility]bsSur[facility] 03/06/2023, 9:15 AM Please see Amion for pager number during day hours 7:00am-4:30pm

## 2023-03-07 LAB — BASIC METABOLIC PANEL
Anion gap: 7 (ref 5–15)
BUN: 33 mg/dL — ABNORMAL HIGH (ref 8–23)
CO2: 26 mmol/L (ref 22–32)
Calcium: 9.3 mg/dL (ref 8.9–10.3)
Chloride: 103 mmol/L (ref 98–111)
Creatinine, Ser: 1.61 mg/dL — ABNORMAL HIGH (ref 0.44–1.00)
GFR, Estimated: 32 mL/min — ABNORMAL LOW (ref >60)
Glucose, Bld: 91 mg/dL (ref 70–99)
Potassium: 4.4 mmol/L (ref 3.5–5.1)
Sodium: 136 mmol/L (ref 135–145)

## 2023-03-07 LAB — MAGNESIUM: Magnesium: 1.9 mg/dL (ref 1.7–2.4)

## 2023-03-07 LAB — PROTIME-INR
INR: 2.5 — ABNORMAL HIGH (ref 0.8–1.2)
Prothrombin Time: 26.9 seconds — ABNORMAL HIGH (ref 11.4–15.2)

## 2023-03-07 MED ORDER — WARFARIN SODIUM 3 MG PO TABS
3.0000 mg | ORAL_TABLET | Freq: Once | ORAL | Status: AC
  Administered 2023-03-07: 3 mg via ORAL
  Filled 2023-03-07: qty 1

## 2023-03-07 NOTE — Progress Notes (Signed)
Physical Therapy Treatment Patient Details Name: [name] MRN: [id number] DOB: [birth year] Today's Date: 03/07/2023   History of Present Illness The pt is an 82 yo female presenting 2/29 after falling while getting out of her car. Imaging showed TBI/R SDH/contusion of R frontal lobe, and L clavicle fx. PMH includes: supranuclear palsy, BPV, HTN, GERD, CKD II, afib, HLD, mechanical aortic valve replacement on Coumadin therapy, and previous breast cancer.    PT Comments    The pt continues to demo improvement in mobility, this session was focused on training the pt's spouse (caregiver) to provide support with ambulation. The pt continues to need modA to complete sit-stand transfers and pivotal transfers to [facility] or recliner. The pt's spouse is able to provide this support and direct her steps well for short-distance movements. The pt was challenged by hallway ambulation, completing multiple scissoring steps and LOB to the Left needing assist from spouse and PT to correct. The pt and her spouse were also educated on LE movements to complete outside of PT sessions to increase movement and activity throughout the day. Recommendations remain appropriate at this time.     Recommendations for follow up therapy are one component of a multi-disciplinary discharge planning process, led by the attending physician.  Recommendations may be updated based on patient status, additional functional criteria and insurance authorization.  Follow Up Recommendations  Acute inpatient rehab (3hours/day)     Assistance Recommended at Discharge Frequent or constant Supervision/Assistance  Patient can return home with the following A lot of help with walking and/or transfers;A lot of help with bathing/dressing/bathroom;Assistance with cooking/housework;Direct supervision/assist for medications management;Direct supervision/assist for financial management;Assist for transportation;Help with stairs or ramp for  entrance   Equipment Recommendations  None recommended by PT    Recommendations for Other Services       Precautions / Restrictions Precautions Precautions: Fall Precaution Comments: pt is a high fall risk, left clavicle fx Required Braces or Orthoses: Sling Restrictions Weight Bearing Restrictions: Yes LUE Weight Bearing: Non weight bearing Other Position/Activity Restrictions: NWB in sling     Mobility  Bed Mobility Overal bed mobility: Needs Assistance Bed Mobility: Supine to Sit     Supine to sit: HOB elevated, Min assist     General bed mobility comments: HOB elevated, pt able to come to sitting EOB with minA to legs and at trunk. increased time    Transfers Overall transfer level: Needs assistance Equipment used: 1 person hand held assist Transfers: Sit to/from Stand Sit to Stand: Mod assist   Step pivot transfers: Mod assist       General transfer comment: modA to rise to standing, posterior lean with modA to prevent posterior LOB. cues for hand placement and posture.    Ambulation/Gait Ambulation/Gait assistance: Mod assist, +2 safety/equipment (chair follow) Gait Distance (Feet): 75 Feet Assistive device: 1 person hand held assist Gait Pattern/deviations: Step-to pattern, Decreased stride length, Narrow base of support, Scissoring, Staggering left, Drifts right/left, Trunk flexed Gait velocity: slow Gait velocity interpretation: <1.31 ft/sec, indicative of household ambulator   General Gait Details: pt with narrow BOS, leaning forwards and left while spouse using HHA on R and gait belt to support. focus on training spouse to assist the pt. pt with multiple episodes of scissoring and needed assist to steady and widen BOS to continue. pt also with tendency to lean forwards and to L needed cues to stop and reset to prevent LOB. increased LOB when turning to L, pt and spouse advised to  turn to R to reduce risk of LOB    Modified Rankin (Stroke Patients  Only) Modified Rankin (Stroke Patients Only) Pre-Morbid Rankin Score: Moderately severe disability Modified Rankin: Moderately severe disability     Balance Overall balance assessment: Needs assistance Sitting-balance support: No upper extremity supported, Feet supported Sitting balance-Leahy Scale: Fair   Postural control: Posterior lean Standing balance support: Single extremity supported Standing balance-Leahy Scale: Poor                              Cognition Arousal/Alertness: Awake/alert Behavior During Therapy: Flat affect Overall Cognitive Status: Impaired/Different from baseline Area of Impairment: Safety/judgement, Awareness, Problem solving, Memory, Following commands                     Memory: Decreased short-term memory Following Commands: Follows one step commands with increased time Safety/Judgement: Decreased awareness of deficits, Decreased awareness of safety Awareness: Intellectual Problem Solving: Decreased initiation, Requires verbal cues, Difficulty sequencing, Slow processing General Comments: pt more alert today, following commands, more vocal in conversation. Pt able to follow simple cues. poor awareness of deficits and needed cues to speak up for needs        Exercises General Exercises - Lower Extremity Ankle Circles/Pumps: AROM, Both, 10 reps, Seated Quad Sets: AROM, Both, 10 reps, Seated Heel Slides: AROM, Both, 10 reps, Seated Hip ABduction/ADduction: AROM, Both, 10 reps, Seated Other Exercises Other Exercises: patient/spouse educated on safe transfers and technique    General Comments General comments (skin integrity, edema, etc.): VSS on RA      Pertinent Vitals/Pain Pain Assessment Pain Assessment: No/denies pain Faces Pain Scale: No hurt Pain Intervention(s): Monitored during session     PT Goals (current goals can now be found in the care plan section) Acute Rehab PT Goals Patient Stated Goal: return  home PT Goal Formulation: With patient/family Time For Goal Achievement: 03/11/23 Potential to Achieve Goals: Good Progress towards PT goals: Progressing toward goals    Frequency    Min 4X/week      PT Plan Current plan remains appropriate       AM-PAC PT "6 Clicks" Mobility   Outcome Measure  Help needed turning from your back to your side while in a flat bed without using bedrails?: A Little Help needed moving from lying on your back to sitting on the side of a flat bed without using bedrails?: A Lot Help needed moving to and from a bed to a chair (including a wheelchair)?: A Lot Help needed standing up from a chair using your arms (e.g., wheelchair or bedside chair)?: A Lot Help needed to walk in hospital room?: A Lot Help needed climbing 3-5 steps with a railing? : Total 6 Click Score: 12    End of Session Equipment Utilized During Treatment: Gait belt Activity Tolerance: Patient tolerated treatment well Patient left: in chair;with call bell/phone within reach;with chair alarm set;with family/visitor present Nurse Communication: Mobility status PT Visit Diagnosis: Other abnormalities of gait and mobility (R26.89);Muscle weakness (generalized) (M62.81);Repeated falls (R29.6);History of falling (Z91.81)     Time: [id number] PT Time Calculation (min) (ACUTE ONLY): 25 min  Charges:  $Gait Training: 23-37 mins                     West Carbo, PT, DPT   Acute Rehabilitation Department Office [phone] Secure Chat Communication Preferred   Sandra Cockayne 03/07/2023, 3:22 PM

## 2023-03-07 NOTE — Care Management Important Message (Signed)
Important Message  Patient Details  Name: [name] MRN: [id number] Date of Birth: 1941/04/20   Medicare Important Message Given:  Yes     Hannah Beat 03/07/2023, 11:52 AM

## 2023-03-07 NOTE — Progress Notes (Signed)
[facility] for warfarin  Indication: atrial fibrillation and mechanical aortic valve   Allergies  Allergen Reactions   Demerol [Meperidine] Shortness Of Breath and Swelling   Oxycontin [Oxycodone] Other (See Comments)    Hallucinations    Adhesive [Tape] Other (See Comments)    Redness and skin tears   Cozaar [Losartan] Other (See Comments)    Dizziness    Fosamax [Alendronate] Other (See Comments)    Heartburn    Ivp Dye [Iodinated Contrast Media] Hives    OK with benadryl pre-med ('50mg'$  one hour before receiving iodinated contrast agent)   Phenergan [Promethazine] Other (See Comments)    Avoids due to reaction with current medications    Patient Measurements: Height: 5' (152.4 cm) Weight: 54 kg (119 lb 0.8 oz) IBW/kg (Calculated) : 45.5  Vital Signs: Temp: 98.6 F (37 C) (03/11 0357) Temp Source: Axillary (03/11 0357) BP: 149/55 (03/11 0357) Pulse Rate: 58 (03/11 0357)  Labs: Recent Labs    03/05/23 0505 03/06/23 0633 03/07/23 0413  LABPROT 26.2* 26.9* 26.9*  INR 2.4* 2.5* 2.5*  CREATININE  --   --  1.61*     Estimated Creatinine Clearance: 19.4 mL/min (A) (by C-G formula based on SCr of 1.61 mg/dL (H)).   Assessment: 60 YOF admitted with SDH.  Patient was on Coumadin 2.'5mg'$  all days except '5mg'$  on Thursdays PTA for history of Afib and mechanical AVR.  Neurosurgery okay with resuming anticoagulation.   3/9 CT negative for new intracranial hemorrhage, increased hematoma along R frontal scalp.  OK to continue Coumadin per discussion with MD.  INR therapeutic 2.5.    Goal of Therapy:  INR 2.5-3.5 (confirmed w/ husband at bedside)    Plan:  Coumadin  3 mg by mouth x1 today Daily PT / INR Monitor for worsening bleeding vs resolution  Wilson Singer, PharmD Clinical Pharmacist 03/07/2023 7:31 AM

## 2023-03-07 NOTE — Progress Notes (Signed)
Patient ID: [name], female   DOB: [birth year], 82 y.o.   MRN: [id number] [facility] Surgery Progress Note     Subjective: Headache much better today.  No new complaints.    Objective: Vital signs in last 24 hours: Temp:  [98.4 F (36.9 C)-98.6 F (37 C)] 98.4 F (36.9 C) (03/11 0754) Pulse Rate:  [58-91] 91 (03/11 0754) Resp:  [20-32] 32 (03/11 0754) BP: (135-154)/(55-80) 135/80 (03/11 0754) SpO2:  [94 %-97 %] 94 % (03/11 0754) Last BM Date : 03/05/23  Intake/Output from previous day: 03/10 0701 - 03/11 0700 In: 240 [P.O.:240] Out: 250 [Urine:250] Intake/Output this shift: No intake/output data recorded.   PE: General: pleasant, WD, elderly female who is laying in bed in NAD HEENT: facial ecchymosis with small hematoma to right temple with steri strips intact, adherent piece of gauze and dried blood. No infection or active bleeding. EOMI Lungs: CTAB, Respiratory effort nonlabored Abd: soft, NT, ND MS: LUE in sling Neuro: non focal exam   Lab Results:  No results for input(s): "WBC", "HGB", "HCT", "PLT" in the last 72 hours. BMET Recent Labs    03/07/23 0413  NA 136  K 4.4  CL 103  CO2 26  GLUCOSE 91  BUN 33*  CREATININE 1.61*  CALCIUM 9.3   PT/INR Recent Labs    03/06/23 0633 03/07/23 0413  LABPROT 26.9* 26.9*  INR 2.5* 2.5*   CMP     Component Value Date/Time   NA 136 03/07/2023 0413   NA 141 08/10/2022 0917   K 4.4 03/07/2023 0413   CL 103 03/07/2023 0413   CO2 26 03/07/2023 0413   GLUCOSE 91 03/07/2023 0413   BUN 33 (H) 03/07/2023 0413   BUN 29 (H) 08/10/2022 0917   CREATININE 1.61 (H) 03/07/2023 0413   CALCIUM 9.3 03/07/2023 0413   PROT 4.7 (L) 02/25/2023 0650   PROT 6.1 08/10/2022 0917   ALBUMIN 2.4 (L) 02/25/2023 0650   ALBUMIN 3.6 (L) 08/10/2022 0917   AST 20 02/25/2023 0650   ALT 16 02/25/2023 0650   ALKPHOS 46 02/25/2023 0650   BILITOT 0.5 02/25/2023 0650   BILITOT 0.3 08/10/2022 0917   GFRNONAA 32 (L)  03/07/2023 0413   GFRAA 32 (L) 07/01/2020 0837   Lipase     Component Value Date/Time   LIPASE 27 02/05/2022 0629       Studies/Results: CT HEAD WO CONTRAST (5MM)  Result Date: 03/05/2023 CLINICAL DATA:  Head trauma with persistent headache. EXAM: CT HEAD WITHOUT CONTRAST TECHNIQUE: Contiguous axial images were obtained from the base of the skull through the vertex without intravenous contrast. RADIATION DOSE REDUCTION: This exam was performed according to the departmental dose-optimization program which includes automated exposure control, adjustment of the mA and/or kV according to patient size and/or use of iterative reconstruction technique. COMPARISON:  Head CT 02/25/2023. FINDINGS: Brain: Interval resolution of previously seen right frontotemporal subdural collection and decreased conspicuity of the previously seen hemorrhagic contusions along the right superior frontal gyrus (for example, axial image 29 series 3). No new intracranial hemorrhage. Unchanged mild chronic small-vessel disease. No mass effect or midline shift. No hydrocephalus or extra-axial collection. Vascular: No hyperdense vessel or unexpected calcification. Skull: No calvarial fracture or suspicious bone lesion. Skull base is unremarkable. Sinuses/Orbits: Unremarkable. Other: Slightly increased hematoma along the right frontal scalp, now measuring up to 17 x 12 mm. IMPRESSION: 1. Interval resolution of previously seen right frontotemporal subdural collection and decreased conspicuity of the previously seen hemorrhagic contusions  along the right superior frontal gyrus. No new intracranial hemorrhage. 2. Slightly increased hematoma along the right frontal scalp, now measuring up to 17 x 12 mm. Electronically Signed   By: Emmit Alexanders M.D.   On: 03/05/2023 12:14    Anti-infectives: Anti-infectives (From admission, onward)    None        Assessment/Plan Fall on coumadin TBI/R SDH/Contusion of the R frontal lobe +  vertex - NSGY consult. Dr. Ellene Route. No acute intervention. No need to reverse anticoagulation.  He is ok with what cardiology needs to do in regards to her anticoagulation. Repeat CTH stable 3/1. Keppra for seizure ppx. Dr. Grandville Silos d/w Dr. Ellene Route - OK to continue coumadin. TBI team therapies. Rpt Ct Head 3/9 for headache negative for worsening TBI.  Headache improved today with tylenol. L clavicle fx - NWB per ortho, sling, f/u with Dr. Marcelino Scot 2 weeks  Scalp laceration - s/p repair 2/29, sutures removed 3/6. Ice prn Hx PAF Hx AVR on Coumadin - continue coumadin per pharmacy Hx BPV  Hx PSP - followed by Dr. Carles Collet with neurology.  Patient has significant fall history and is unable to mobilize on her own.  She requires assistive device and support of husband at ALL times. Per report they were weaning carbidopa-levodopa at home, continue here per pharmacy HX CKD2 - creatinine elevated at baseline; adequate uop Hx HTN Hx HLD   FEN - reg diet, SLIV VTE - SCDs, Coumadin - INR 2.5 ID - None Foley - foley removed 3/4, voiding     Dispo - 4NP. Medically stable for CIR. - awaiting ins auth.  I reviewed last 24 h vitals and pain scores, last 48 h intake and output, and last 24 h labs and trends.  I reviewed nursing notes as well    LOS: 11 days    Henreitta Cea, [facility] 03/07/2023, 9:15 AM Please see Amion for pager number during day hours 7:00am-4:30pm

## 2023-03-07 NOTE — Progress Notes (Signed)
Inpatient Rehab Admissions Coordinator:    I continue to await insurance auth for CIR. I will follow for potential admit pending insurance auth.   Clemens Catholic, [place], [place] Admissions Coordinator  [phone] ([place]) [phone] (office)

## 2023-03-08 ENCOUNTER — Other Ambulatory Visit: Payer: Self-pay

## 2023-03-08 ENCOUNTER — Inpatient Hospital Stay (HOSPITAL_COMMUNITY)
Admission: RE | Admit: 2023-03-08 | Discharge: 2023-03-16 | DRG: 945 | Disposition: A | Discharge status: Home Health | Payer: Medicare HMO | Source: Intra-hospital | Attending: Physical Medicine and Rehabilitation | Admitting: Physical Medicine and Rehabilitation

## 2023-03-08 ENCOUNTER — Encounter (HOSPITAL_COMMUNITY): Payer: Self-pay | Admitting: Physical Medicine and Rehabilitation

## 2023-03-08 DIAGNOSIS — S065X0A Traumatic subdural hemorrhage without loss of consciousness, initial encounter: Secondary | ICD-10-CM | POA: Diagnosis not present

## 2023-03-08 DIAGNOSIS — N179 Acute kidney failure, unspecified: Secondary | ICD-10-CM | POA: Diagnosis not present

## 2023-03-08 DIAGNOSIS — I130 Hypertensive heart and chronic kidney disease with heart failure and stage 1 through stage 4 chronic kidney disease, or unspecified chronic kidney disease: Secondary | ICD-10-CM | POA: Diagnosis present

## 2023-03-08 DIAGNOSIS — K589 Irritable bowel syndrome without diarrhea: Secondary | ICD-10-CM | POA: Diagnosis present

## 2023-03-08 DIAGNOSIS — M2042 Other hammer toe(s) (acquired), left foot: Secondary | ICD-10-CM | POA: Diagnosis present

## 2023-03-08 DIAGNOSIS — G20C Parkinsonism, unspecified: Secondary | ICD-10-CM | POA: Diagnosis not present

## 2023-03-08 DIAGNOSIS — S069XAA Unspecified intracranial injury with loss of consciousness status unknown, initial encounter: Secondary | ICD-10-CM | POA: Diagnosis present

## 2023-03-08 DIAGNOSIS — E785 Hyperlipidemia, unspecified: Secondary | ICD-10-CM | POA: Diagnosis not present

## 2023-03-08 DIAGNOSIS — Z952 Presence of prosthetic heart valve: Secondary | ICD-10-CM | POA: Diagnosis not present

## 2023-03-08 DIAGNOSIS — F519 Sleep disorder not due to a substance or known physiological condition, unspecified: Secondary | ICD-10-CM | POA: Diagnosis present

## 2023-03-08 DIAGNOSIS — S0636AD Traumatic hemorrhage of cerebrum, unspecified, with loss of consciousness status unknown, subsequent encounter: Secondary | ICD-10-CM | POA: Diagnosis not present

## 2023-03-08 DIAGNOSIS — Z888 Allergy status to other drugs, medicaments and biological substances status: Secondary | ICD-10-CM

## 2023-03-08 DIAGNOSIS — R509 Fever, unspecified: Secondary | ICD-10-CM | POA: Diagnosis present

## 2023-03-08 DIAGNOSIS — J9811 Atelectasis: Secondary | ICD-10-CM | POA: Diagnosis present

## 2023-03-08 DIAGNOSIS — S069X1D Unspecified intracranial injury with loss of consciousness of 30 minutes or less, subsequent encounter: Secondary | ICD-10-CM | POA: Diagnosis not present

## 2023-03-08 DIAGNOSIS — K449 Diaphragmatic hernia without obstruction or gangrene: Secondary | ICD-10-CM | POA: Diagnosis not present

## 2023-03-08 DIAGNOSIS — R54 Age-related physical debility: Secondary | ICD-10-CM | POA: Diagnosis present

## 2023-03-08 DIAGNOSIS — G8929 Other chronic pain: Secondary | ICD-10-CM | POA: Diagnosis present

## 2023-03-08 DIAGNOSIS — S069XAS Unspecified intracranial injury with loss of consciousness status unknown, sequela: Secondary | ICD-10-CM | POA: Diagnosis not present

## 2023-03-08 DIAGNOSIS — R41844 Frontal lobe and executive function deficit: Secondary | ICD-10-CM | POA: Diagnosis present

## 2023-03-08 DIAGNOSIS — Z853 Personal history of malignant neoplasm of breast: Secondary | ICD-10-CM

## 2023-03-08 DIAGNOSIS — R519 Headache, unspecified: Secondary | ICD-10-CM | POA: Diagnosis present

## 2023-03-08 DIAGNOSIS — D62 Acute posthemorrhagic anemia: Secondary | ICD-10-CM | POA: Diagnosis not present

## 2023-03-08 DIAGNOSIS — K5730 Diverticulosis of large intestine without perforation or abscess without bleeding: Secondary | ICD-10-CM | POA: Diagnosis present

## 2023-03-08 DIAGNOSIS — R6889 Other general symptoms and signs: Secondary | ICD-10-CM | POA: Diagnosis not present

## 2023-03-08 DIAGNOSIS — R0602 Shortness of breath: Secondary | ICD-10-CM | POA: Diagnosis not present

## 2023-03-08 DIAGNOSIS — H8110 Benign paroxysmal vertigo, unspecified ear: Secondary | ICD-10-CM | POA: Diagnosis not present

## 2023-03-08 DIAGNOSIS — Z7901 Long term (current) use of anticoagulants: Secondary | ICD-10-CM

## 2023-03-08 DIAGNOSIS — N182 Chronic kidney disease, stage 2 (mild): Secondary | ICD-10-CM | POA: Diagnosis not present

## 2023-03-08 DIAGNOSIS — H5789 Other specified disorders of eye and adnexa: Secondary | ICD-10-CM | POA: Diagnosis not present

## 2023-03-08 DIAGNOSIS — I480 Paroxysmal atrial fibrillation: Secondary | ICD-10-CM | POA: Diagnosis not present

## 2023-03-08 DIAGNOSIS — K219 Gastro-esophageal reflux disease without esophagitis: Secondary | ICD-10-CM | POA: Diagnosis not present

## 2023-03-08 DIAGNOSIS — G4700 Insomnia, unspecified: Secondary | ICD-10-CM | POA: Diagnosis not present

## 2023-03-08 DIAGNOSIS — M542 Cervicalgia: Secondary | ICD-10-CM | POA: Diagnosis not present

## 2023-03-08 DIAGNOSIS — S069X9D Unspecified intracranial injury with loss of consciousness of unspecified duration, subsequent encounter: Secondary | ICD-10-CM | POA: Diagnosis not present

## 2023-03-08 DIAGNOSIS — R053 Chronic cough: Secondary | ICD-10-CM | POA: Diagnosis present

## 2023-03-08 DIAGNOSIS — S0181XD Laceration without foreign body of other part of head, subsequent encounter: Secondary | ICD-10-CM

## 2023-03-08 DIAGNOSIS — S42002A Fracture of unspecified part of left clavicle, initial encounter for closed fracture: Secondary | ICD-10-CM | POA: Diagnosis not present

## 2023-03-08 DIAGNOSIS — I5022 Chronic systolic (congestive) heart failure: Secondary | ICD-10-CM | POA: Diagnosis present

## 2023-03-08 DIAGNOSIS — H538 Other visual disturbances: Secondary | ICD-10-CM | POA: Diagnosis present

## 2023-03-08 DIAGNOSIS — G231 Progressive supranuclear ophthalmoplegia [Steele-Richardson-Olszewski]: Secondary | ICD-10-CM | POA: Diagnosis present

## 2023-03-08 DIAGNOSIS — Z87442 Personal history of urinary calculi: Secondary | ICD-10-CM

## 2023-03-08 DIAGNOSIS — G243 Spasmodic torticollis: Secondary | ICD-10-CM | POA: Diagnosis present

## 2023-03-08 DIAGNOSIS — Z9181 History of falling: Secondary | ICD-10-CM

## 2023-03-08 DIAGNOSIS — Z961 Presence of intraocular lens: Secondary | ICD-10-CM | POA: Diagnosis present

## 2023-03-08 DIAGNOSIS — S065X1A Traumatic subdural hemorrhage with loss of consciousness of 30 minutes or less, initial encounter: Secondary | ICD-10-CM | POA: Diagnosis not present

## 2023-03-08 DIAGNOSIS — Z85828 Personal history of other malignant neoplasm of skin: Secondary | ICD-10-CM

## 2023-03-08 DIAGNOSIS — Z79899 Other long term (current) drug therapy: Secondary | ICD-10-CM

## 2023-03-08 DIAGNOSIS — R339 Retention of urine, unspecified: Secondary | ICD-10-CM | POA: Diagnosis present

## 2023-03-08 DIAGNOSIS — R062 Wheezing: Secondary | ICD-10-CM | POA: Diagnosis not present

## 2023-03-08 DIAGNOSIS — M47812 Spondylosis without myelopathy or radiculopathy, cervical region: Secondary | ICD-10-CM | POA: Diagnosis present

## 2023-03-08 DIAGNOSIS — Z90710 Acquired absence of both cervix and uterus: Secondary | ICD-10-CM

## 2023-03-08 DIAGNOSIS — Z8249 Family history of ischemic heart disease and other diseases of the circulatory system: Secondary | ICD-10-CM

## 2023-03-08 DIAGNOSIS — S42032D Displaced fracture of lateral end of left clavicle, subsequent encounter for fracture with routine healing: Secondary | ICD-10-CM

## 2023-03-08 DIAGNOSIS — I10 Essential (primary) hypertension: Secondary | ICD-10-CM | POA: Diagnosis not present

## 2023-03-08 DIAGNOSIS — Z885 Allergy status to narcotic agent status: Secondary | ICD-10-CM

## 2023-03-08 DIAGNOSIS — S065X0D Traumatic subdural hemorrhage without loss of consciousness, subsequent encounter: Secondary | ICD-10-CM

## 2023-03-08 DIAGNOSIS — E871 Hypo-osmolality and hyponatremia: Secondary | ICD-10-CM | POA: Diagnosis present

## 2023-03-08 DIAGNOSIS — S065XAA Traumatic subdural hemorrhage with loss of consciousness status unknown, initial encounter: Secondary | ICD-10-CM | POA: Diagnosis present

## 2023-03-08 LAB — PROTIME-INR
INR: 2.2 — ABNORMAL HIGH (ref 0.8–1.2)
Prothrombin Time: 24.4 seconds — ABNORMAL HIGH (ref 11.4–15.2)

## 2023-03-08 MED ORDER — ONDANSETRON HCL 4 MG PO TABS: 4.0000 mg | ORAL_TABLET | Freq: Four times a day (QID) | ORAL | Status: DC | PRN

## 2023-03-08 MED ORDER — FLEET ENEMA 7-19 GM/118ML RE ENEM: 1.0000 | ENEMA | Freq: Once | RECTAL | Status: DC | PRN

## 2023-03-08 MED ORDER — METHOCARBAMOL 500 MG PO TABS
500.0000 mg | ORAL_TABLET | Freq: Three times a day (TID) | ORAL | Status: DC
  Administered 2023-03-08 – 2023-03-16 (×24): 500 mg via ORAL
  Filled 2023-03-08 (×24): qty 1

## 2023-03-08 MED ORDER — WARFARIN SODIUM 3 MG PO TABS
3.0000 mg | ORAL_TABLET | Freq: Once | ORAL | Status: AC
  Administered 2023-03-08: 3 mg via ORAL
  Filled 2023-03-08: qty 1

## 2023-03-08 MED ORDER — CARBIDOPA-LEVODOPA 25-250 MG PO TABS
1.0000 | ORAL_TABLET | ORAL | Status: DC
  Administered 2023-03-08 – 2023-03-15 (×4): 1 via ORAL
  Filled 2023-03-08 (×4): qty 1

## 2023-03-08 MED ORDER — SIMVASTATIN 20 MG PO TABS
20.0000 mg | ORAL_TABLET | Freq: Every day | ORAL | Status: DC
  Administered 2023-03-08 – 2023-03-15 (×8): 20 mg via ORAL
  Filled 2023-03-08 (×8): qty 1

## 2023-03-08 MED ORDER — MELATONIN 3 MG PO TABS: 3.0000 mg | ORAL_TABLET | Freq: Every evening | ORAL | Status: DC | PRN

## 2023-03-08 MED ORDER — ONDANSETRON HCL 4 MG/2ML IJ SOLN: 4.0000 mg | Freq: Four times a day (QID) | INTRAMUSCULAR | Status: DC | PRN

## 2023-03-08 MED ORDER — DOCUSATE SODIUM 100 MG PO CAPS
100.0000 mg | ORAL_CAPSULE | Freq: Two times a day (BID) | ORAL | Status: DC
  Administered 2023-03-08 – 2023-03-16 (×16): 100 mg via ORAL
  Filled 2023-03-08 (×16): qty 1

## 2023-03-08 MED ORDER — WARFARIN SODIUM 3 MG PO TABS
3.0000 mg | ORAL_TABLET | Freq: Once | ORAL | Status: DC
  Filled 2023-03-08: qty 1

## 2023-03-08 MED ORDER — TRAZODONE HCL 50 MG PO TABS
25.0000 mg | ORAL_TABLET | Freq: Every evening | ORAL | Status: DC | PRN
  Administered 2023-03-13: 50 mg via ORAL
  Filled 2023-03-08: qty 1

## 2023-03-08 MED ORDER — ENSURE ENLIVE PO LIQD
237.0000 mL | Freq: Two times a day (BID) | ORAL | Status: DC
  Administered 2023-03-09 – 2023-03-16 (×16): 237 mL via ORAL

## 2023-03-08 MED ORDER — CARBIDOPA-LEVODOPA 25-250 MG PO TABS
2.0000 | ORAL_TABLET | ORAL | Status: DC
  Administered 2023-03-09 – 2023-03-16 (×13): 2 via ORAL
  Filled 2023-03-08 (×15): qty 2

## 2023-03-08 MED ORDER — METHOCARBAMOL 1000 MG/10ML IJ SOLN
500.0000 mg | Freq: Three times a day (TID) | INTRAVENOUS | Status: DC
  Filled 2023-03-08: qty 5

## 2023-03-08 MED ORDER — SORBITOL 70 % SOLN: 30.0000 mL | Freq: Every day | Status: DC | PRN

## 2023-03-08 MED ORDER — PANTOPRAZOLE SODIUM 40 MG PO TBEC
40.0000 mg | DELAYED_RELEASE_TABLET | Freq: Every day | ORAL | Status: DC
  Administered 2023-03-08 – 2023-03-16 (×9): 40 mg via ORAL
  Filled 2023-03-08 (×9): qty 1

## 2023-03-08 MED ORDER — ACETAMINOPHEN 325 MG PO TABS
325.0000 mg | ORAL_TABLET | ORAL | Status: DC | PRN
  Administered 2023-03-08: 650 mg via ORAL
  Administered 2023-03-09: 325 mg via ORAL
  Administered 2023-03-09 (×2): 650 mg via ORAL
  Administered 2023-03-09: 325 mg via ORAL
  Administered 2023-03-09 – 2023-03-15 (×7): 650 mg via ORAL
  Filled 2023-03-08: qty 2
  Filled 2023-03-08: qty 1
  Filled 2023-03-08 (×3): qty 2
  Filled 2023-03-08: qty 1
  Filled 2023-03-08 (×6): qty 2

## 2023-03-08 MED ORDER — GUAIFENESIN-DM 100-10 MG/5ML PO SYRP: 5.0000 mL | ORAL_SOLUTION | Freq: Four times a day (QID) | ORAL | Status: DC | PRN

## 2023-03-08 MED ORDER — TRAMADOL HCL 50 MG PO TABS
50.0000 mg | ORAL_TABLET | Freq: Four times a day (QID) | ORAL | Status: DC | PRN
  Administered 2023-03-08 – 2023-03-13 (×11): 50 mg via ORAL
  Filled 2023-03-08 (×11): qty 1

## 2023-03-08 MED ORDER — POLYETHYLENE GLYCOL 3350 17 G PO PACK: 17.0000 g | PACK | Freq: Every day | ORAL | Status: DC | PRN

## 2023-03-08 MED ORDER — CARBIDOPA-LEVODOPA 25-250 MG PO TABS
1.0000 | ORAL_TABLET | ORAL | Status: DC
  Administered 2023-03-13 – 2023-03-15 (×6): 1 via ORAL
  Filled 2023-03-08 (×6): qty 1

## 2023-03-08 MED ORDER — TAMSULOSIN HCL 0.4 MG PO CAPS
0.4000 mg | ORAL_CAPSULE | Freq: Every day | ORAL | Status: DC
  Administered 2023-03-09 – 2023-03-16 (×8): 0.4 mg via ORAL
  Filled 2023-03-08 (×8): qty 1

## 2023-03-08 MED ORDER — WARFARIN - PHARMACIST DOSING INPATIENT: Freq: Every day | Status: DC
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
## 2023-03-08 NOTE — Progress Notes (Signed)
Orthopedic Tech Progress Note Patient Details:  [name] Dec 22, 1941 [id number]  Ortho Devices Type of Ortho Device: Sling immobilizer Ortho Device/Splint Location: LUE Ortho Device/Splint Interventions: Ordered, Application, Adjustment   Post Interventions Patient Tolerated: Well Instructions Provided: Adjustment of device, Care of device  [name] OTR/L 03/08/2023, 6:52 PM

## 2023-03-08 NOTE — Progress Notes (Signed)
Pt with d/c orders to CIR. Report given all questions answered. PIV left in place per RN. Pt and all belongings transported via bed without incident. Pt husband at bedside.
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
## 2023-03-08 NOTE — Progress Notes (Signed)
Inpatient Rehabilitation Admission Medication Review by a Pharmacist  A complete drug regimen review was completed for this patient to identify any potential clinically significant medication issues.  High Risk Drug Classes Is patient taking? Indication by Medication  Antipsychotic {Receiving?:26196}   Anticoagulant {Receiving?:26196} Warfarin - mechanical AVR  Antibiotic {Receiving?:26196}   Opioid {Receiving?:26196}   Antiplatelet {Receiving?:26196}   Hypoglycemics/insulin {Yes or No?:26198}   Vasoactive Medication {Receiving?:26196}   Chemotherapy {Receiving Chemo?:26197}   Other {Yes or No?:26198} Amiodarone, metoprolol - Afib Sinemet - Parkinson Vit D, MVI, Kcl, Vit B12 - supplement Pepcid, pantoprazole - GERD ppx Furosemide - CHF Melatonin - PRN sleep Simvastatin - HLD     Type of Medication Issue Identified Description of Issue Recommendation(s)  Drug Interaction(s) (clinically significant)     Duplicate Therapy     Allergy     No Medication Administration End Date     Incorrect Dose     Additional Drug Therapy Needed     Significant med changes from prior encounter (inform family/care partners about these prior to discharge).  Communicate medication changes with patient/family at discharge  Other       Clinically significant medication issues were identified that warrant physician communication and completion of prescribed/recommended actions by midnight of the next day:  {Yes or No?:26198}  Name of provider notified for urgent issues identified: ***   Provider Method of Notification: ***    Pharmacist comments: ***   Time spent performing this drug regimen review (minutes): 20   Thank you for allowing pharmacy to be a part of this patient's care.  Ardyth Harps, PharmD Clinical Pharmacist

## 2023-03-08 NOTE — TOC Transition Note (Signed)
Transition of Care [facility]) - CM/SW Discharge Note   Patient Details  Name: [name] MRN: [id number] Date of Birth: 03/09/1941  Transition of Care [facility]) CM/SW Contact:  Ella Bodo, RN Phone Number: 03/08/2023, 11:00 AM   Clinical Narrative:    Patient medically stable and insurance authorization has been received for admission to [facility] IP Rehab.  Plan dc to CIR when bed ready.     Final next level of care: IP Rehab Facility Barriers to Discharge: Barriers Resolved   Patient Goals and CMS Choice CMS Medicare.gov Compare Post Acute Care list provided to:: Patient Represenative (must comment) (husband) Choice offered to / list presented to : Patient, Spouse  Discharge Placement                         Discharge Plan and Services Additional resources added to the After Visit Summary for     Discharge Planning Services: CM Consult Post Acute Care Choice: Home Health          DME Arranged: Shower stool   Date DME Agency Contacted: 03/01/23 Time DME Agency Contacted: [id number] Representative spoke with at DME Agency: Erasmo Downer HH Arranged: PT, OT HH Agency: [place] Date [place]: 03/01/23 Time [place]: 1300 Representative spoke with at [place]: [place] ([place]) Interventions SDOH Screenings   Tobacco Use: Low Risk  (01/26/2023)     Readmission Risk Interventions    02/11/2022   10:27 AM  Readmission Risk Prevention Plan  Transportation Screening Complete  PCP or Specialist Appt within 3-5 Days Complete  HRI or [place] Complete  Social Work Consult for [place] Planning/Counseling Complete  Palliative Care Screening Not Applicable  Medication Review (RN Care Manager) Complete   Reinaldo Raddle, RN, BSN  Trauma/Neuro ICU Case Manager [phone]

## 2023-03-08 NOTE — Progress Notes (Signed)
[place] for warfarin  Indication: atrial fibrillation and mechanical aortic valve   Allergies  Allergen Reactions   Demerol [Meperidine] Shortness Of Breath and Swelling   Oxycontin [Oxycodone] Other (See Comments)    Hallucinations    Adhesive [Tape] Other (See Comments)    Redness and skin tears   Cozaar [Losartan] Other (See Comments)    Dizziness    Fosamax [Alendronate] Other (See Comments)    Heartburn    Ivp Dye [Iodinated Contrast Media] Hives    OK with benadryl pre-med ('50mg'$  one hour before receiving iodinated contrast agent)   Phenergan [Promethazine] Other (See Comments)    Avoids due to reaction with current medications    Patient Measurements: Height: 5' (152.4 cm) Weight: 54 kg (119 lb 0.8 oz) IBW/kg (Calculated) : 45.5  Vital Signs: Temp: 98.2 F (36.8 C) (03/12 0745) Temp Source: Oral (03/12 0745) BP: 127/62 (03/12 0745) Pulse Rate: 77 (03/12 0745)  Labs: Recent Labs    03/06/23 0633 03/07/23 0413 03/08/23 0329  LABPROT 26.9* 26.9* 24.4*  INR 2.5* 2.5* 2.2*  CREATININE  --  1.61*  --      Estimated Creatinine Clearance: 19.4 mL/min (A) (by C-G formula based on SCr of 1.61 mg/dL (H)).   Assessment: 60 YOF admitted with SDH.  Patient was on Coumadin 2.'5mg'$  all days except '5mg'$  on Thursdays PTA for history of Afib and mechanical AVR.  Neurosurgery okay with resuming anticoagulation.   3/9 CT negative for new intracranial hemorrhage, increased hematoma along R frontal scalp.  OK to continue Coumadin per discussion with MD.  INR sub-therapeutic 2.2    Goal of Therapy:  INR 2.5-3.5 (confirmed w/ husband at bedside)    Plan:  Coumadin 3 mg by mouth x1 today Daily PT / INR Monitor for worsening bleeding vs resolution  Wilson Singer, PharmD Clinical Pharmacist 03/08/2023 9:29 AM

## 2023-03-09 DIAGNOSIS — S069XAA Unspecified intracranial injury with loss of consciousness status unknown, initial encounter: Secondary | ICD-10-CM

## 2023-03-09 DIAGNOSIS — S069XAS Unspecified intracranial injury with loss of consciousness status unknown, sequela: Secondary | ICD-10-CM

## 2023-03-09 LAB — CBC WITH DIFFERENTIAL/PLATELET
Abs Immature Granulocytes: 0.03 10*3/uL (ref 0.00–0.07)
Basophils Absolute: 0 10*3/uL (ref 0.0–0.1)
Basophils Relative: 0 %
Eosinophils Absolute: 0.1 10*3/uL (ref 0.0–0.5)
Eosinophils Relative: 1 %
HCT: 24.3 % — ABNORMAL LOW (ref 36.0–46.0)
Hemoglobin: 8.5 g/dL — ABNORMAL LOW (ref 12.0–15.0)
Immature Granulocytes: 0 %
Lymphocytes Relative: 8 %
Lymphs Abs: 0.7 10*3/uL (ref 0.7–4.0)
MCH: 35.1 pg — ABNORMAL HIGH (ref 26.0–34.0)
MCHC: 35 g/dL (ref 30.0–36.0)
MCV: 100.4 fL — ABNORMAL HIGH (ref 80.0–100.0)
Monocytes Absolute: 0.6 10*3/uL (ref 0.1–1.0)
Monocytes Relative: 7 %
Neutro Abs: 7.5 10*3/uL (ref 1.7–7.7)
Neutrophils Relative %: 84 %
Platelets: 204 10*3/uL (ref 150–400)
RBC: 2.42 MIL/uL — ABNORMAL LOW (ref 3.87–5.11)
RDW: 13.7 % (ref 11.5–15.5)
WBC: 9 10*3/uL (ref 4.0–10.5)
nRBC: 0 % (ref 0.0–0.2)

## 2023-03-09 LAB — COMPREHENSIVE METABOLIC PANEL
ALT: 7 U/L (ref 0–44)
AST: 15 U/L (ref 15–41)
Albumin: 2.6 g/dL — ABNORMAL LOW (ref 3.5–5.0)
Alkaline Phosphatase: 59 U/L (ref 38–126)
Anion gap: 12 (ref 5–15)
BUN: 32 mg/dL — ABNORMAL HIGH (ref 8–23)
CO2: 23 mmol/L (ref 22–32)
Calcium: 9.3 mg/dL (ref 8.9–10.3)
Chloride: 98 mmol/L (ref 98–111)
Creatinine, Ser: 1.73 mg/dL — ABNORMAL HIGH (ref 0.44–1.00)
GFR, Estimated: 29 mL/min — ABNORMAL LOW (ref >60)
Glucose, Bld: 93 mg/dL (ref 70–99)
Potassium: 4.4 mmol/L (ref 3.5–5.1)
Sodium: 133 mmol/L — ABNORMAL LOW (ref 135–145)
Total Bilirubin: 0.3 mg/dL (ref 0.3–1.2)
Total Protein: 5.8 g/dL — ABNORMAL LOW (ref 6.5–8.1)

## 2023-03-09 LAB — IRON AND TIBC
Iron: 16 ug/dL — ABNORMAL LOW (ref 28–170)
Saturation Ratios: 6 % — ABNORMAL LOW (ref 10.4–31.8)
TIBC: 269 ug/dL (ref 250–450)
UIBC: 253 ug/dL

## 2023-03-09 LAB — PROTIME-INR
INR: 2 — ABNORMAL HIGH (ref 0.8–1.2)
Prothrombin Time: 22.8 seconds — ABNORMAL HIGH (ref 11.4–15.2)

## 2023-03-09 LAB — MAGNESIUM: Magnesium: 1.7 mg/dL (ref 1.7–2.4)

## 2023-03-09 LAB — FERRITIN: Ferritin: 137 ng/mL (ref 11–307)

## 2023-03-09 MED ORDER — WARFARIN SODIUM 4 MG PO TABS
4.0000 mg | ORAL_TABLET | Freq: Once | ORAL | Status: AC
  Administered 2023-03-09: 4 mg via ORAL
  Filled 2023-03-09: qty 1

## 2023-03-09 MED ORDER — FERROUS SULFATE 325 (65 FE) MG PO TABS
325.0000 mg | ORAL_TABLET | ORAL | Status: DC
  Administered 2023-03-10 – 2023-03-16 (×4): 325 mg via ORAL
  Filled 2023-03-09 (×4): qty 1

## 2023-03-09 MED ORDER — MELATONIN 5 MG PO TABS
5.0000 mg | ORAL_TABLET | Freq: Every day | ORAL | Status: DC
  Administered 2023-03-09 – 2023-03-15 (×7): 5 mg via ORAL
  Filled 2023-03-09 (×7): qty 1

## 2023-03-09 NOTE — Progress Notes (Signed)
Expand All Collapse All           Physical Medicine and Rehabilitation Consult Reason for Consult: Assess candidacy for CIR Referring Physician: Trauma MD     HPI: [name] is a 82 y.o. female with a history of PSP that caused pre-admission stability impairments, who had a fall in a parking lot while exiting her car resulting in a TBI/R SDH/contusion of the right frontal lobe and vertex. NSGY Dr. Ellene Route was consulted and no acute intervention was planned. She was on coumadin and maintained on this as per cardiology recommendations. Repeat Head CT on 3/1 was stable. Keppra was started for seizure prophylaxis. Husband at bedside is extremely devoted. Physical Medicine & Rehabilitation was consulted to assess candidacy for CIR.       Review of Systems  Constitutional:  Positive for malaise/fatigue.   +impaired mobility, preadmission instability due to PSP     Past Medical History:  Diagnosis Date   BPV (benign positional vertigo)     Chronic neck pain      C2-3 arthropathy   CKD (chronic kidney disease), stage II     Diverticulosis of colon     GERD (gastroesophageal reflux disease)     Hiatal hernia     History of breast cancer per pt no recurrence    dx 2004-- Left breast DCIS (ER/PR+, HER2 negative) s/p  partial masectomy w/ sln bx and  chemoradiotion (completed 2004)   History of herpes zoster     History of kidney stones     HTN (hypertension)     Hyperlipidemia     Irritable bowel syndrome     LBBB (left bundle branch block)     PAF (paroxysmal atrial fibrillation) ([place])      a. CHA2DS2VASc = 4-->coumadin.   Parkinsonism      neurologist-  dr tat   PONV (postoperative nausea and vomiting)     S/P aortic valve replacement with prosthetic valve      a. 04/13/2006 s/p St Jude mechnical AVR for severe AS;  b. 09/2014 Echo: EF 40-45%, gr1 DD, mild AI/MR, triv TR/PR.   Squamous cell carcinoma of skin 04/06/2022    right nasal sidewall   SUI (stress urinary  incontinence, female)     Symptomatic anemia     Venous varices           Past Surgical History:  Procedure Laterality Date   ANTERIOR AND POSTERIOR REPAIR   1990    cystocele/ rectocele   AORTIC VALVE REPLACEMENT   04/13/06   dr gerhardt    and Replacement Ascending Aorta (St. Jude mechanical prosthesis)   APPENDECTOMY   child 1950   BREAST EXCISIONAL BIOPSY Left     BREAST LUMPECTOMY Left 01/11/2003    malignant   BREAST SURGERY Left 2004   CARDIAC CATHETERIZATION   02-22-2001   dr Martinique    minimal non-obstructive cad/  mild aortic stenosis and aortic root enlargement/  normal LVF, ef 65%   CARDIAC CATHETERIZATION   03-22-2006    dr Martinique    no significant obstructive cad/  severe aortic stenosis and mild to moderate aortic root enlargement/  upper normal right heart pressures   CARDIOVASCULAR STRESS TEST   09-30-2011   dr Martinique    lexiscan nuclear study w/ apical thinning  vs  small prior apical infarct w/ no significant ischemia/  hypokinesis of the distal septum and apex, ef 50%   CARPAL TUNNEL RELEASE Bilateral left 09-09-2004/  right 2007   CATARACT EXTRACTION W/ INTRAOCULAR LENS  IMPLANT, BILATERAL   2015   COLONOSCOPY N/A 10/03/2014    Procedure: COLONOSCOPY;  Surgeon: [name], MD;  Location: [facility];  Service: Endoscopy;  Laterality: N/A;   CYSTOSCOPY WITH RETROGRADE PYELOGRAM, URETEROSCOPY AND STENT PLACEMENT Right 09/17/2016    Procedure: CYSTOSCOPY WITH RIGHT RETROGRADE PYELOGRAM, RIGHT URETEROSCOPY AND STENT PLACEMENT LASER LITHOTRIPSY, RIGHT STENT PLACEMENT;  Surgeon: [name], MD;  Location: [facility];  Service: Urology;  Laterality: Right;   ESOPHAGEAL MANOMETRY N/A 08/12/2014    Procedure: ESOPHAGEAL MANOMETRY (EM);  Surgeon: [name], MD;  Location: [facility];  Service: Endoscopy;  Laterality: N/A;   ESOPHAGOGASTRODUODENOSCOPY N/A 10/03/2014    Procedure: ESOPHAGOGASTRODUODENOSCOPY (EGD);  Surgeon: [name], MD;   Location: [facility]COPY;  Service: Endoscopy;  Laterality: N/A;   ESOPHAGOGASTRODUODENOSCOPY N/A 12/08/2018    Procedure: ESOPHAGOGASTRODUODENOSCOPY (EGD);  Surgeon: Doran Stabler, MD;  Location: [place];  Service: Gastroenterology;  Laterality: N/A;   EXTRACORPOREAL SHOCK WAVE LITHOTRIPSY   1997   FOOT SURGERY Right 2013    hammertoe x2   HOLMIUM LASER APPLICATION Right [id number]    Procedure: HOLMIUM LASER APPLICATION;  Surgeon: [name], MD;  Location[facility];  Service: Urology;  Laterality: Right;   PERCUTANEOUS NEPHROSTOLITHOTOMY   1993   STONE EXTRACTION WITH BASKET Right 09/17/2016    Procedure: STONE EXTRACTION WITH BASKET;  Surgeon: [name], MD;  Location: [facility];  Service: Urology;  Laterality: Right;   TOTAL ABDOMINAL HYSTERECTOMY   1971    w/ Bilateral Salpingoophorectomy   TRANSTHORACIC ECHOCARDIOGRAM   10/24/2014   dr [place]ique    hypokinesis of the basal and mid inferoseptal and inferior walls,  ef 40-45%,  grade 1 diastolic dysfunction/  mechanical bileaflet aortic valve noted w/ mild regur., peak grandient 45mHg, valve area 1.35cm^2/  severe MV calcification without stenosis w/ mild regurg., peak gradient 351mg/  mild LAE/  poss. atrial mass (MRI showed benign lipomatous hypertrophy of atrial septum) /tFrazier ButtR/mild TR         Family History  Problem Relation Age of Onset   Heart disease Mother     Heart disease Father [id number]      CABG   Other Brother          AVR   Healthy Child      Social History:  reports that she has never smoked. She has never used smokeless tobacco. She reports that she does not drink alcohol and does not use drugs. Allergies:       Allergies  Allergen Reactions   Demerol [Meperidine] Shortness Of Breath and Swelling   Oxycontin [Oxycodone] Other (See Comments)      Hallucinations    Adhesive [Tape] Other (See Comments)      Redness and skin tears   Cozaar [Losartan] Other  (See Comments)      Dizziness    Fosamax [Alendronate] Other (See Comments)      Heartburn    Ivp Dye [Iodinated Contrast Media] Hives      OK with benadryl pre-med ('50mg'$  one hour before receiving iodinated contrast agent)   Phenergan [Promethazine] Other (See Comments)      Avoids due to reaction with current medications          Medications Prior to Admission  Medication Sig Dispense Refill   amiodarone (PACERONE) 200 MG tablet Take 1 tablet by mouth  once daily (Patient taking differently: Take 200 mg by mouth See admin instructions. 1 tablet every morning on Monday-Saturday. Does not take on Sunday.) 90 tablet 3   CALCIUM CITRATE PO Take 1 tablet by mouth 3 (three) times daily.       carbidopa-levodopa (SINEMET IR) 25-100 MG tablet TAKE 2 TABLETS BY MOUTH THREE TIMES DAILY (Patient taking differently: Take 1-2 tablets by mouth See admin instructions. Week of 02/27/23: 2 tablets in the morning, 2 tablets at noon, 1 tablet in the evening on Sunday-Thursday. 2 tablets three times daily on Friday, Saturday. Week of 03/06/23: 2 tablets in the morning, 2 tablets at noon, 1 tablet in the evening Sunday-Friday. 2 tablets three times daily on Saturday. Week of 03/13/23: 2 tablets in the morning, 2 tablets at noon, 1 tablet in the evening) 540 tablet 0   Cholecalciferol (VITAMIN D3) 2000 units TABS Take 2,000 Units by mouth daily.        famotidine-calcium carbonate-magnesium hydroxide (PEPCID COMPLETE) 10-800-165 MG CHEW chewable tablet Chew 1 tablet by mouth daily as needed (heartburn).       fexofenadine (ALLEGRA) 60 MG tablet Take 60 mg by mouth at bedtime.       Fish Oil-Krill Oil (SCHIFF KRILL & FISH OIL BLEND) CAPS Take 3 capsules by mouth daily at 12 noon.       furosemide (LASIX) 20 MG tablet Take 1 tablet by mouth once daily 90 tablet 3   melatonin 5 MG TABS Take 5 mg by mouth at bedtime.       metoprolol tartrate (LOPRESSOR) 25 MG tablet Take 1 tablet (25 mg total) by mouth daily as needed (if  heart rate is above 110). 90 tablet 3   Multiple Vitamin (MULTIVITAMIN) tablet Take 1 tablet by mouth daily at 12 noon.       Multiple Vitamins-Minerals (PRESERVISION AREDS 2) CAPS Take 1 capsule by mouth 2 (two) times daily.       OVER THE COUNTER MEDICATION Take 1 capsule by mouth daily. Neuriva       pantoprazole (PROTONIX) 40 MG tablet Take 40 mg by mouth daily.       potassium chloride SA (KLOR-CON M) 20 MEQ tablet Take 20 mEq by mouth daily.       Probiotic Product (PROBIOTIC PO) Take 1 capsule by mouth daily.       simvastatin (ZOCOR) 20 MG tablet Take 20 mg by mouth every evening.       vitamin B-12 (CYANOCOBALAMIN) 1000 MCG tablet Take 1,000 mcg by mouth daily.       warfarin (COUMADIN) 5 MG tablet Take 2.5-5 mg by mouth See admin instructions. 5 mg every Thursday evening 2.5 mg every evening on all other days          Home: [place] expects to be discharged to:: Private residence Living Arrangements: Spouse/significant other Available Help at Discharge: Family, Available 24 hours/day Type of Home: House Home Access: Stairs to enter [employer] of Steps: 1 Entrance Stairs-Rails: None Home Layout: Two level, Able to live on main level with bedroom/bathroom Alternate Level Stairs-Number of Steps: flight Alternate Level Stairs-Rails: Right, Left Bathroom Shower/Tub: Multimedia programmer: Handicapped height Bathroom Accessibility: Yes Home Equipment: Rollator (4 wheels), Grab bars - toilet, Grab bars - tub/shower, Wheelchair - manual, Transport planner Additional Comments: will furniture walk or walk with husband, uses helmet for mobility at home  Lives With: Spouse  Functional History: Prior Function Prior Level of Function : Needs assist  Mobility Comments: hands on assist with rollator for walking ADLs Comments: husband assists with bathing and dressing Functional Status:  Mobility: Bed Mobility Overal bed mobility: Needs  Assistance Bed Mobility: Sit to Supine Rolling: Min assist Sidelying to sit: Min assist Supine to sit: Min assist Sit to supine: Min assist Sit to sidelying: Min assist General bed mobility comments: worked with spouse and dtr on assisting pt with getting in/out of bed via log rolling to the R and pushing upwith R UE with contact cues at hips. Verbal cues to spouse to help direct and assist pt, dtr present. minA for trunk elevation with constant directional cues Transfers Overall transfer level: Needs assistance Equipment used: 1 person hand held assist Transfers: Sit to/from Stand, Bed to chair/wheelchair/BSC Sit to Stand: Mod assist Bed to/from chair/wheelchair/BSC transfer type:: Step pivot Step pivot transfers: Min assist General transfer comment: Family provided with VC and visual demonstration on transfer technique including gait belt use, safety awareness, and trype of directional cues to provide. When completing a step pvt transfer from/to bed and [facility], daughter completed a face to face transfer technique. When completing a sit to stand and then walk to recliner, OT recommended standing to the right side of patient (due to LUE WB restrictions). Pt and daughter to hold hands on right and daughter's left hand to help faciliate trunk flexion to initiate sit<>stand. Ambulation/Gait Ambulation/Gait assistance: Mod assist, +2 safety/equipment (chair follow) Gait Distance (Feet): 50 Feet (x2) Assistive device: 1 person hand held assist Gait Pattern/deviations: Step-to pattern, Decreased stride length, Narrow base of support General Gait Details: had spouse amb with pt providing R HHA and use of hand around waist holding onto gait belt to provide stability for first 50', s/p rest break dtr then amb pt with same technique. verbal cues on proper hand placement and how to use whole body if needed to support pt. overall pt min/modA as pt unable to use RW due to L UE NWB Gait velocity: slow Gait  velocity interpretation: <1.31 ft/sec, indicative of household ambulator   ADL: ADL Overall ADL's : Needs assistance/impaired Eating/Feeding: NPO Grooming: Min guard, Wash/dry face, Sitting Upper Body Bathing: Moderate assistance, With caregiver independent assisting, Standing Lower Body Bathing: Maximal assistance, With caregiver independent assisting Lower Body Bathing Details (indicate cue type and reason): Pt stands in the shower and holds onto the grab bar and her husband bathes her in standing Upper Body Dressing : Moderate assistance, Sitting Upper Body Dressing Details (indicate cue type and reason): extra gown Lower Body Dressing: Min guard, Sitting/lateral leans Lower Body Dressing Details (indicate cue type and reason): able to don socks long sitting in the bed Toilet Transfer: BSC/3in1, Cueing for safety, Cueing for sequencing, Moderate assistance, Minimal assistance Toilet Transfer Details (indicate cue type and reason): step pvt, face to face transfer. Required physical assist to bring trunk forward when initiating sit to stand as well as when decending to bed surface. Once standing, pt required Min A for balance. Toileting- Clothing Manipulation and Hygiene: Moderate assistance, Cueing for safety, Cueing for sequencing, Sit to/from stand Functional mobility during ADLs: Minimal assistance, +2 for safety/equipment, +2 for physical assistance (hand held assist) General ADL Comments: Hands on family training completed with Husband and daughter regarding toileting and toilet transfer using BSC.   Cognition: Cognition Overall Cognitive Status: Impaired/Different from baseline Arousal/Alertness: Awake/alert Orientation Level: Oriented X4 Attention: Selective Selective Attention: Impaired Selective Attention Impairment: Verbal complex Memory: Appears intact Awareness: Appears intact Problem Solving: Impaired Problem Solving Impairment:  Verbal basic Safety/Judgment: Appears  intact Cognition Arousal/Alertness: Awake/alert Behavior During Therapy: Flat affect Overall Cognitive Status: Impaired/Different from baseline Area of Impairment: Safety/judgement, Awareness, Problem solving, Memory, Following commands Memory: Decreased short-term memory Following Commands: Follows one step commands with increased time Safety/Judgement: Decreased awareness of deficits, Decreased awareness of safety Awareness: Intellectual Problem Solving: Decreased initiation, Requires verbal cues, Difficulty sequencing, Slow processing General Comments: Eyes remained close for most of session although able to open eyes when cued. Daughter reports pt is sensitive to light.   Blood pressure 131/62, pulse 62, temperature 97.8 F (36.6 C), temperature source Oral, resp. rate 20, height 5' (1.524 m), weight 54 kg, SpO2 98 %. Physical Exam Gen: no distress, normal appearing HEENT: oral mucosa pink and moist, NCAT Cardio: Reg rate Chest: normal effort, normal rate of breathing Abd: soft, non-distended Ext: no edema Psych: pleasant, normal affect, bright and smiling once awoken Skin: intact Neuro: Somnolent but easily arousable MSK: severe bunions, left arm in sling, 4/5 strength in other extremities Lab Results Last 24 Hours       Results for orders placed or performed during the hospital encounter of 02/24/23 (from the past 24 hour(s))  Protime-INR     Status: Abnormal    Collection Time: 03/02/23  4:48 AM  Result Value Ref Range    Prothrombin Time 25.0 (H) 11.4 - 15.2 seconds    INR 2.3 (H) 0.8 - 1.2      Imaging Results (Last 48 hours)  No results found.     Assessment/Plan: Diagnosis: TBI Does the need for close, 24 hr/day medical supervision in concert with the patient's rehab needs make it unreasonable for this patient to be served in a less intensive setting? Yes Co-Morbidities requiring supervision/potential complications:  PSP: continue Sinemet  Fall Urinary  retention: continue flomax HLD: continue simvastatin Aortic valve disease Due to bladder management, bowel management, safety, skin/wound care, disease management, medication administration, pain management, and patient education, does the patient require 24 hr/day rehab nursing? Yes Does the patient require coordinated care of a physician, rehab nurse, therapy disciplines of PT, OT, SLP to address physical and functional deficits in the context of the above medical diagnosis(es)? Yes Addressing deficits in the following areas: balance, endurance, locomotion, strength, transferring, bowel/bladder control, bathing, dressing, feeding, grooming, toileting, cognition, and psychosocial support Can the patient actively participate in an intensive therapy program of at least 3 hrs of therapy per day at least 5 days per week? Yes The potential for patient to make measurable gains while on inpatient rehab is excellent Anticipated functional outcomes upon discharge from inpatient rehab are supervision  with PT, supervision with OT, supervision with SLP. Estimated rehab length of stay to reach the above functional goals is: 10-14 days Anticipated discharge destination: Home Overall Rehab/Functional Prognosis: excellent   POST ACUTE RECOMMENDATIONS: This patient's condition is appropriate for continued rehabilitative care in the following setting: CIR Patient has agreed to participate in recommended program. Yes Note that insurance prior authorization may be required for reimbursement for recommended care.       I have personally performed a face to face diagnostic evaluation of this patient. Additionally, I have examined the patient's medical record including any pertinent labs and radiographic images. If the physician assistant has documented in this note, I have reviewed and edited or otherwise concur with the physician assistant's documentation.   Thanks,   Izora Ribas, MD 03/02/2023

## 2023-03-09 NOTE — Progress Notes (Signed)
Occupational Therapy Assessment and Plan  Patient Details  Name: [name] MRN: [id number] Date of Birth: January 19, 1941  OT Diagnosis: abnormal posture, cognitive deficits, and muscle weakness (generalized) Rehab Potential: Rehab Potential (ACUTE ONLY): Good ELOS: 10-14   Today's Date: 03/09/2023 OT Individual Time: 0845-1000 OT Individual Time Calculation (min): 75 min     Hospital Problem: Principal Problem:   TBI (traumatic brain injury) ([place]) Active Problems:   Traumatic subdural hematoma (SDH) ([place])   Fracture of unspecified part of left clavicle, initial encounter for closed fracture   Past Medical History:  Past Medical History:  Diagnosis Date   BPV (benign positional vertigo)    Chronic neck pain    C2-3 arthropathy   CKD (chronic kidney disease), stage II    Diverticulosis of colon    GERD (gastroesophageal reflux disease)    Hiatal hernia    History of breast cancer per pt no recurrence   dx 2004-- Left breast DCIS (ER/PR+, HER2 negative) s/p  partial masectomy w/ sln bx and  chemoradiotion (completed 2004)   History of herpes zoster    History of kidney stones    HTN (hypertension)    Hyperlipidemia    Irritable bowel syndrome    LBBB (left bundle branch block)    PAF (paroxysmal atrial fibrillation) ([place])    a. CHA2DS2VASc = 4-->coumadin.   Parkinsonism    neurologist-  dr tat   PONV (postoperative nausea and vomiting)    S/P aortic valve replacement with prosthetic valve    a. 04/13/2006 s/p St Jude mechnical AVR for severe AS;  b. 09/2014 Echo: EF 40-45%, gr1 DD, mild AI/MR, triv TR/PR.   Squamous cell carcinoma of skin 04/06/2022   right nasal sidewall   SUI (stress urinary incontinence, female)    Symptomatic anemia    Venous varices     Past Surgical History:  Past Surgical History:  Procedure Laterality Date   ANTERIOR AND POSTERIOR REPAIR  1990   cystocele/ rectocele   AORTIC VALVE REPLACEMENT  04/13/06   dr gerhardt   and Replacement  Ascending Aorta (St. Jude mechanical prosthesis)   APPENDECTOMY  child 1950   BREAST EXCISIONAL BIOPSY Left    BREAST LUMPECTOMY Left 01/11/2003   malignant   BREAST SURGERY Left 2004   CARDIAC CATHETERIZATION  02-22-2001   dr Martinique   minimal non-obstructive cad/  mild aortic stenosis and aortic root enlargement/  normal LVF, ef 65%   CARDIAC CATHETERIZATION  03-22-2006    dr Martinique   no significant obstructive cad/  severe aortic stenosis and mild to moderate aortic root enlargement/  upper normal right heart pressures   CARDIOVASCULAR STRESS TEST  09-30-2011   dr Martinique   lexiscan nuclear study w/ apical thinning  vs  small prior apical infarct w/ no significant ischemia/  hypokinesis of the distal septum and apex, ef 50%   CARPAL TUNNEL RELEASE Bilateral left 09-09-2004/  right 2007   CATARACT EXTRACTION W/ INTRAOCULAR LENS  IMPLANT, BILATERAL  2015   COLONOSCOPY N/A 10/03/2014   Procedure: COLONOSCOPY;  Surgeon: [name], MD;  Location: [facility];  Service: Endoscopy;  Laterality: N/A;   CYSTOSCOPY WITH RETROGRADE PYELOGRAM, URETEROSCOPY AND STENT PLACEMENT Right 09/17/2016   Procedure: CYSTOSCOPY WITH RIGHT RETROGRADE PYELOGRAM, RIGHT URETEROSCOPY AND STENT PLACEMENT LASER LITHOTRIPSY, RIGHT STENT PLACEMENT;  Surgeon: [name], MD;  Location: [facility];  Service: Urology;  Laterality: Right;   ESOPHAGEAL MANOMETRY N/A 08/12/2014   Procedure: ESOPHAGEAL MANOMETRY (  EM);  Surgeon: [name], MD;  Location: [facility];  Service: Endoscopy;  Laterality: N/A;   ESOPHAGOGASTRODUODENOSCOPY N/A 10/03/2014   Procedure: ESOPHAGOGASTRODUODENOSCOPY (EGD);  Surgeon: [name], MD;  Location: [facility];  Service: Endoscopy;  Laterality: N/A;   ESOPHAGOGASTRODUODENOSCOPY N/A 12/08/2018   Procedure: ESOPHAGOGASTRODUODENOSCOPY (EGD);  Surgeon: Doran Stabler, MD;  Location: [facility];  Service: Gastroenterology;  Laterality: N/A;   EXTRACORPOREAL  SHOCK WAVE LITHOTRIPSY  1997   FOOT SURGERY Right 2013   hammertoe x2   HOLMIUM LASER APPLICATION Right [id number]   Procedure: HOLMIUM LASER APPLICATION;  Surgeon: [name], MD;  Location: [facility];  Service: Urology;  Laterality: Right;   PERCUTANEOUS NEPHROSTOLITHOTOMY  1993   STONE EXTRACTION WITH BASKET Right 09/17/2016   Procedure: STONE EXTRACTION WITH BASKET;  Surgeon: [name], MD;  Location: [facility];  Service: Urology;  Laterality: Right;   TOTAL ABDOMINAL HYSTERECTOMY  1971   w/ Bilateral Salpingoophorectomy   TRANSTHORACIC ECHOCARDIOGRAM  10/24/2014   dr Martinique   hypokinesis of the basal and mid inferoseptal and inferior walls,  ef 40-45%,  grade 1 diastolic dysfunction/  mechanical bileaflet aortic valve noted w/ mild regur., peak grandient 26mHg, valve area 1.35cm^2/  severe MV calcification without stenosis w/ mild regurg., peak gradient 361mg/  mild LAE/  poss. atrial mass (MRI showed benign lipomatous hypertrophy of atrial septum) /tFrazier ButtR/mild TR    Assessment & Plan Clinical Impression:  The pt is an [age]o female presenting 2/29 after falling while getting out of her car. Imaging showed TBI/R SDH/contusion of R frontal lobe, and L clavicle fx. PMH includes: supranuclear palsy, BPV, HTN, GERD, CKD II, afib, HLD, mechanical aortic valve replacement on Coumadin therapy, and previous breast cancer    Patient currently requires max with basic self-care skills secondary to muscle weakness, decreased cardiorespiratoy endurance, impaired timing and sequencing, unbalanced muscle activation, and decreased coordination, decreased attention, decreased awareness, decreased problem solving, decreased safety awareness, and decreased memory, and decreased sitting balance, decreased standing balance, decreased postural control, and decreased balance strategies.  Prior to hospitalization, patient could complete BADL with min.  Patient  will benefit from skilled intervention to decrease level of assist with basic self-care skills prior to discharge home with care partner.  Anticipate patient will require 24 hour supervision and minimal physical assistance and follow up home health.  OT - End of Session Activity Tolerance: Tolerates 30+ min activity with multiple rests Endurance Deficit: Yes OT Assessment Rehab Potential (ACUTE ONLY): Good OT Barriers to Discharge: Weight bearing restrictions;Behavior OT Patient demonstrates impairments in the following area(s): Balance;Cognition;Behavior;Endurance;Motor;Nutrition;Safety;Sensory;Skin Integrity OT Basic ADL's Functional Problem(s): Grooming;Bathing;Dressing;Toileting OT Transfers Functional Problem(s): Toilet;Tub/Shower OT Plan OT Intensity: Minimum of 1-2 x/day, 45 to 90 minutes OT Frequency: 5 out of 7 days;Total of 15 hours over 7 days of combined therapies OT Duration/Estimated Length of Stay: 10-14 OT Treatment/Interventions: Balance/vestibular training;Cognitive remediation/compensation;Discharge planning;Disease mangement/prevention;DME/adaptive equipment instruction;Functional electrical stimulation;Functional mobility training;Neuromuscular re-education;Pain management;Patient/family education;Psychosocial support;Self Care/advanced ADL retraining;Skin care/wound managment;Splinting/orthotics;Therapeutic Activities;Therapeutic Exercise;UE/LE Strength taining/ROM;UE/LE Coordination activities;Wheelchair propulsion/positioning OT Self Feeding Anticipated Outcome(s): S OT Basic Self-Care Anticipated Outcome(s): min UB, MOD LB OT Toileting Anticipated Outcome(s): MOD OT Bathroom Transfers Anticipated Outcome(s): CGA toilet, MIN A shower OT Recommendation Patient destination: Home Follow Up Recommendations: 24 hour supervision/assistance;Home health OT Equipment Recommended: None recommended by OT   OT Evaluation Precautions/Restrictions  Precautions Precautions:  Fall Precaution Comments: pt is a high fall risk, left clavicle fx Required Braces  or Orthoses: Sling Restrictions Weight Bearing Restrictions: Yes LUE Weight Bearing: Non weight bearing Other Position/Activity Restrictions: NWB in sling General Chart Reviewed: Yes Family/Caregiver Present: No Vital Signs   Pain Pain Assessment Pain Score: 0-No pain Home Living/Prior Functioning Home Living Family/patient expects to be discharged to:: Private residence Living Arrangements: Spouse/significant other, Children Available Help at Discharge: Family, Available 24 hours/day Type of Home: House Home Access: Stairs to enter Technical brewer of Steps: 1 Entrance Stairs-Rails: None Home Layout: Two level, Able to live on main level with bedroom/bathroom Alternate Level Stairs-Number of Steps: flight Alternate Level Stairs-Rails: Right, Left Bathroom Shower/Tub: Gaffer, Door [employer] Toilet: Handicapped height Bathroom Accessibility: Yes Additional Comments: will furniture walk or walk with husband, uses helmet for mobility at home  Lives With: Spouse Prior Function Level of Independence: Needs assistance with ADLs, Needs assistance with gait Leisure: Hobbies-yes (Comment) (yatzee) Vision Baseline Vision/History: 1 Wears glasses Ability to See in Adequate Light: 0 Adequate Patient Visual Report: No change from baseline Vision Assessment?: No apparent visual deficits Perception  Perception: Within Functional Limits Praxis Praxis: Intact Cognition Cognition Overall Cognitive Status: Impaired/Different from baseline Arousal/Alertness: Awake/alert Orientation Level: Person;Place;Situation Person: Oriented Place: Oriented Situation: Oriented Safety/Judgment: Impaired Brief Interview for Mental Status (BIMS) Repetition of Three Words (First Attempt): 3 Temporal Orientation: Year: Correct Temporal Orientation: Month: Accurate within 5 days Temporal Orientation: Day:  Incorrect Recall: "Sock": No, could not recall Recall: "Blue": No, could not recall Recall: "Bed": No, could not recall BIMS Summary Score: 8 Sensation Sensation Light Touch: Appears Intact Coordination Gross Motor Movements are Fluid and Coordinated: Yes (RUE only) Fine Motor Movements are Fluid and Coordinated: Yes Finger Nose Finger Test: mildly off target reaching Motor  Motor Motor: Within Functional Limits  Trunk/Postural Assessment  Cervical Assessment Cervical Assessment:  (forward head) Thoracic Assessment Thoracic Assessment:  (rounded shoulders) Lumbar Assessment Lumbar Assessment:  (posterior pelvic tilt) Postural Control Postural Control: Deficits on evaluation Righting Reactions: delayed and inadequate Protective Responses: delayed and inadequate  Balance Balance Balance Assessed: Yes Static Sitting Balance Static Sitting - Balance Support: Feet unsupported Static Sitting - Level of Assistance: 5: Stand by assistance Dynamic Sitting Balance Dynamic Sitting - Balance Support: Feet unsupported Dynamic Sitting - Level of Assistance: 4: Min assist Static Standing Balance Static Standing - Balance Support: During functional activity;Right upper extremity supported Static Standing - Level of Assistance: 3: Mod assist Dynamic Standing Balance Dynamic Standing - Balance Support: During functional activity;Right upper extremity supported Dynamic Standing - Level of Assistance: 3: Mod assist Extremity/Trunk Assessment   RUE generalized weakness  LUE NWB need cuing to not reach overhead  Care Tool Care Tool Self Care Eating   Eating Assist Level: Minimal Assistance - Patient > 75%    Oral Care    Oral Care Assist Level: Moderate Assistance - Patient 50 - 74%    Bathing   Body parts bathed by patient: Right arm;Left arm;Chest;Abdomen;Right upper leg;Left upper leg;Face Body parts bathed by helper: Front perineal area;Buttocks;Right lower leg;Left lower leg    Assist Level: Moderate Assistance - Patient 50 - 74%    Upper Body Dressing(including orthotics)   What is the patient wearing?: Button up shirt   Assist Level: Total Assistance - Patient < 25%    Lower Body Dressing (excluding footwear)   What is the patient wearing?: Pants Assist for lower body dressing: Total Assistance - Patient < 25%    Putting on/Taking off footwear   What is the patient wearing?: Non-skid slipper  socks;Socks Assist for footwear: Total Assistance - Patient < 25%       Care Tool Toileting Toileting activity   Assist for toileting: Total Assistance - Patient < 25%     Care Tool Bed Mobility Roll left and right activity   Roll left and right assist level: Moderate Assistance - Patient 50 - 74%    Sit to lying activity   Sit to lying assist level: Moderate Assistance - Patient 50 - 74%    Lying to sitting on side of bed activity   Lying to sitting on side of bed assist level: the ability to move from lying on the back to sitting on the side of the bed with no back support.: Moderate Assistance - Patient 50 - 74%     Care Tool Transfers Sit to stand transfer   Sit to stand assist level: Moderate Assistance - Patient 50 - 74%    Chair/bed transfer   Chair/bed transfer assist level: Moderate Assistance - Patient 50 - 74%     Toilet transfer   Assist Level: Moderate Assistance - Patient 50 - 74%     Care Tool Cognition  Expression of Ideas and Wants    Understanding Verbal and Non-Verbal Content     Memory/Recall Ability     Refer to Care Plan for Long Term Goals  SHORT TERM GOAL WEEK 1 OT Short Term Goal 1 (Week 1): Pt will transfer to toilet wiht MIN A OT Short Term Goal 2 (Week 1): Pt will don shirt wiht MOD A OT Short Term Goal 3 (Week 1): Pt will maintain dynamic standing balance with MIN A in prep for toileting OT Short Term Goal 4 (Week 1): pt will groom in standing to demo improved activity tolerance  Recommendations for other  services: None    Skilled Therapeutic Intervention ADL ADL Eating: Minimal assistance Where Assessed-Eating: Bed level Grooming: Moderate assistance Where Assessed-Grooming: Chair Upper Body Bathing: Minimal assistance Where Assessed-Upper Body Bathing: Edge of bed Lower Body Bathing: Maximal assistance Where Assessed-Lower Body Bathing: Edge of bed Upper Body Dressing: Maximal assistance Where Assessed-Upper Body Dressing: Edge of bed Lower Body Dressing: Maximal assistance Where Assessed-Lower Body Dressing: Edge of bed Toileting: Maximal assistance Where Assessed-Toileting: Glass blower/designer: Moderate assistance Toilet Transfer Method: Stand pivot Mobility  Bed Mobility Bed Mobility: Supine to Sit;Sit to Supine Supine to Sit: Moderate Assistance - Patient 50-74% Sit to Supine: Moderate Assistance - Patient 50-74% Transfers Sit to Stand: Moderate Assistance - Patient 50-74% Stand to Sit: Moderate Assistance - Patient 50-74%  1:1. Pt educated on OT role/purpose, CIR, ELOS, and TBI recovery. Husband present for entire session providing discussion of PLOF and home set up. Pt completes BADL as stated above demoing poor sitting balance with R lean needing multimodal cuing for midline orientation. Pt needs overall MOD A for mobility with max-total for BADLs. Pt is impulsive at baseline and needs cuing to wait for clincian prior to mobilizing. Pt left in recliner at end of session with husband supervising.    Discharge Criteria: Patient will be discharged from OT if patient refuses treatment 3 consecutive times without medical reason, if treatment goals not met, if there is a change in medical status, if patient makes no progress towards goals or if patient is discharged from hospital.  The above assessment, treatment plan, treatment alternatives and goals were discussed and mutually agreed upon: by patient  Tonny Branch 03/09/2023, 12:27 PM

## 2023-03-09 NOTE — Progress Notes (Signed)
[place] for warfarin  Indication: atrial fibrillation and mechanical aortic valve   Allergies  Allergen Reactions   Demerol [Meperidine] Shortness Of Breath and Swelling   Oxycontin [Oxycodone] Other (See Comments)    Hallucinations    Adhesive [Tape] Other (See Comments)    Redness and skin tears   Cozaar [Losartan] Other (See Comments)    Dizziness    Fosamax [Alendronate] Other (See Comments)    Heartburn    Ivp Dye [Iodinated Contrast Media] Hives    OK with benadryl pre-med ('50mg'$  one hour before receiving iodinated contrast agent)   Phenergan [Promethazine] Other (See Comments)    Avoids due to reaction with current medications    Patient Measurements: Height: 5' (152.4 cm) Weight: 56 kg (123 lb 7.3 oz) IBW/kg (Calculated) : 45.5  Vital Signs: Temp: 99.1 F (37.3 C) (03/12 2009) Temp Source: Oral (03/12 2009) BP: 135/62 (03/12 2009) Pulse Rate: 88 (03/12 2009)  Labs: Recent Labs    03/07/23 0413 03/08/23 0329 03/09/23 0604  HGB  --   --  8.5*  HCT  --   --  24.3*  PLT  --   --  204  LABPROT 26.9* 24.4* 22.8*  INR 2.5* 2.2* 2.0*  CREATININE 1.61*  --  1.73*    Estimated Creatinine Clearance: 19.7 mL/min (A) (by C-G formula based on SCr of 1.73 mg/dL (H)).   Assessment: 53 YOF admitted with SDH.  Patient was on Coumadin 2.'5mg'$  all days except '5mg'$  on Thursdays PTA for history of Afib and mechanical AVR.  Neurosurgery okay with resuming anticoagulation > warfarin restarted 3/01.   3/09 CT negative for new intracranial hemorrhage, increased hematoma along R frontal scalp. OK to continue Coumadin per discussion with MD. INR trending back down 2.5 > 2.0 over past 2 days. Hgb trending down 9.2 > 8.5. No signs bleeding reported. Will conservatively increase warfarin dose.    Goal of Therapy:  INR 2.5-3.5 (confirmed w/ husband at bedside)    Plan:  Give warfarin 4 mg PO x1 dose Check INR daily while on warfarin Continue to  monitor H&H and platelets  Thank you for allowing pharmacy to be a part of this patient's care.  Ardyth Harps, PharmD Clinical Pharmacist
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
## 2023-03-09 NOTE — Progress Notes (Signed)
Inpatient Rehabilitation  Patient information reviewed and entered into eRehab system by Joby Hershkowitz M. Javoni Lucken, M.A., CCC/SLP, PPS Coordinator.  Information including medical coding, functional ability and quality indicators will be reviewed and updated through discharge.    

## 2023-03-09 NOTE — Care Management (Signed)
[facility] Individual Statement of Services  Patient Name:  [name]  Date:  03/09/2023  Welcome to the [facility].  Our goal is to provide you with an individualized program based on your diagnosis and situation, designed to meet your specific needs.  With this comprehensive rehabilitation program, you will be expected to participate in at least 3 hours of rehabilitation therapies Monday-Friday, with modified therapy programming on the weekends.  Your rehabilitation program will include the following services:  Physical Therapy (PT), Occupational Therapy (OT), 24 hour per day rehabilitation nursing, Therapeutic Recreaction (TR), Psychology, Neuropsychology, Care Coordinator, Rehabilitation Medicine, [place], and Other  Weekly team conferences will be held on Tuesdays to discuss your progress.  Your Inpatient Rehabilitation Care Coordinator will talk with you frequently to get your input and to update you on team discussions.  Team conferences with you and your family in attendance may also be held.  Expected length of stay: 10-14 days    Overall anticipated outcome: Minimal Assistance  Depending on your progress and recovery, your program may change. Your Inpatient Rehabilitation Care Coordinator will coordinate services and will keep you informed of any changes. Your Inpatient Rehabilitation Care Coordinator's name and contact numbers are listed  below.  The following services may also be recommended but are not provided by the [facility] will be made to provide these services after discharge if needed.  Arrangements include referral to agencies that provide these services.  Your insurance has been verified to be:  [employer]  Your primary doctor  is:  Leanna Battles  Pertinent information will be shared with your doctor and your insurance company.  Inpatient Rehabilitation Care Coordinator:  Cathleen Corti [phone] or [phone]  Information discussed with and copy given to patient by: Rana Snare, 03/09/2023, 1:46 PM
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
## 2023-03-09 NOTE — Plan of Care (Signed)
  Problem: RH Problem Solving Goal: LTG Patient will demonstrate problem solving for (SLP) Description: LTG:  Patient will demonstrate problem solving for basic/complex daily situations with cues  (SLP) Flowsheets (Taken 03/09/2023 1525) LTG: Patient will demonstrate problem solving for (SLP): Basic daily situations LTG Patient will demonstrate problem solving for: Supervision   Problem: RH Awareness Goal: LTG: Patient will demonstrate awareness during functional activites type of (SLP) Description: LTG: Patient will demonstrate awareness during functional activites type of (SLP) Flowsheets (Taken 03/09/2023 1525) Patient will demonstrate during cognitive/linguistic activities awareness type of: Emergent LTG: Patient will demonstrate awareness during cognitive/linguistic activities with assistance of (SLP): Supervision

## 2023-03-09 NOTE — Progress Notes (Signed)
PMR Admission Coordinator Pre-Admission Assessment   Patient: [name] is an 82 y.o., female MRN: [id number] DOB: Oct 04, 1941 Height: 5' (152.4 cm) Weight: 54 kg                                                                                                                                                  Insurance Information HMO: yes    PPO:      PCP:      IPA:      80/20:      OTHER:  PRIMARY: [employer]      Policy#: [id number]       Subscriber: Pt CM Name: Merleen Nicely      Phone#: [phone]     Fax#: [phone] Per Ubaldo Glassing with [employer] Medicare, Pt. Approved [id number]  Pre-Cert#: [id number]      Employer:  Benefits:  Phone #: [phone]     Name:  Eff. Date: 12/27/22     Deduct: $0      Out of Pocket Max: $4500 (met $20)      Life Max: N/A  CIR: $295/day for 6 days total $1770/admission      SNF: $0 days 1-20; $203 for days 21-100 Outpatient: med nec     Co-Pay: $20/visit Home Health: 100%      Co-Pay: none DME: 80%     Co-Pay: 20% Providers: in network  SECONDARY:       Policy#:       Phone#:    Development worker, community:       Phone#:    The Engineer, petroleum" for patients in Inpatient Rehabilitation Facilities with attached "Privacy Act [place] Records" was provided and verbally reviewed with: Family   Emergency Contact Information Contact Information       Name Relation Home Work [place] D Spouse [phone]   [phone]    Harrison Mons Daughter [phone]   [phone]    Cordelia Pen Daughter [phone]             Current Medical History  Patient Admitting Diagnosis: TBI   History of Present Illness: [name] is a 82 y.o. female with hx of Progressive supranuclear palsy, BPV, HTN, HLD, CKD2, PAF and mechanical AVR on Coumadin (INR 2.4) who presented to the ED 02/24/23 after a fall. Patient was attempting to get out of her car when she fell. At baseline she has  significant fall hx and cannot ambulate without assistive devices/husband's support. Found to have a small laceration to the R forehead with an arterial bleed that was closed with figure of 8 stitch.  A large pon developed from this, but it did stop bleeding. Complains of L chest wall pain and L shoulder pain. Airway intact. . CXR and pelvic xrays done. CTH w/ R SDH and contusion of the R frontal lobe +  vertex contusions. CT C-Spine negative, but with pain in her midline with ROM. L clavicle fx on films. Scalp laceration repaired 02/24/23. Neurosurgery recommends conservative management of SDH. Pt. NWB and in sling for clavicle fracture. No surgical intervention recommended for it either.  Glasgow Coma Scale Score: 15   Patient's medical record from [facility] has been reviewed by the rehabilitation admission coordinator and physician.   Past Medical History      Past Medical History:  Diagnosis Date   BPV (benign positional vertigo)     Chronic neck pain      C2-3 arthropathy   CKD (chronic kidney disease), stage II     Diverticulosis of colon     GERD (gastroesophageal reflux disease)     Hiatal hernia     History of breast cancer per pt no recurrence    dx 2004-- Left breast DCIS (ER/PR+, HER2 negative) s/p  partial masectomy w/ sln bx and  chemoradiotion (completed 2004)   History of herpes zoster     History of kidney stones     HTN (hypertension)     Hyperlipidemia     Irritable bowel syndrome     LBBB (left bundle branch block)     PAF (paroxysmal atrial fibrillation) (HCC)      a. CHA2DS2VASc = 4-->coumadin.   Parkinsonism      neurologist-  dr tat   PONV (postoperative nausea and vomiting)     S/P aortic valve replacement with prosthetic valve      a. 04/13/2006 s/p St Jude mechnical AVR for severe AS;  b. 09/2014 Echo: EF 40-45%, gr1 DD, mild AI/MR, triv TR/PR.   Squamous cell carcinoma of skin 04/06/2022    right nasal sidewall   SUI (stress urinary  incontinence, female)     Symptomatic anemia     Venous varices        Has the patient had major surgery during 100 days prior to admission? Yes   Family History  family history includes Healthy in her child; Heart disease in her mother; Heart disease (age of onset: 41) in her father; Other in her brother.     Current Medications    Current Facility-Administered Medications:    acetaminophen (TYLENOL) tablet 650 mg, 650 mg, Oral, Q6H, White, Sharon Mt, MD, 650 mg at 03/02/23 0730   carbidopa-levodopa (SINEMET IR) 25-250 MG per tablet immediate release 1 tablet, 1 tablet, Oral, Once per day on Sun Mon Tue, White, Christopher M, MD, 1 tablet at 03/01/23 [id number]   carbidopa-levodopa (SINEMET IR) 25-250 MG per tablet immediate release 1 tablet, 1 tablet, Oral, 2 times per day on Sun Mon Tue, White, Christopher M, MD, 1 tablet at 03/01/23 1658   carbidopa-levodopa (SINEMET IR) 25-250 MG per tablet immediate release 2 tablet, 2 tablet, Oral, 3 times per day on Wed Thu Fri Sat, White, Christopher M, MD, 2 tablet at 03/02/23 1201   Chlorhexidine Gluconate Cloth 2 % PADS 6 each, 6 each, Topical, Daily, Ileana Roup, MD, 6 each at 02/28/23 0813   docusate sodium (COLACE) capsule 100 mg, 100 mg, Oral, BID, Ileana Roup, MD, 100 mg at 03/02/23 1011   guaiFENesin (ROBITUSSIN) 100 MG/5ML liquid 5 mL, 5 mL, Oral, QHS PRN, Ileana Roup, MD, 5 mL at 03/01/23 0242   levETIRAcetam (KEPPRA) tablet 500 mg, 500 mg, Oral, BID, Ileana Roup, MD, 500 mg at 03/02/23 1010   melatonin tablet 3 mg, 3 mg, Oral, QHS PRN, White,  Sharon Mt, MD, 3 mg at 03/01/23 2127   menthol-cetylpyridinium (CEPACOL) lozenge 3 mg, 1 lozenge, Oral, PRN, Ileana Roup, MD, 3 mg at 02/26/23 2318   methocarbamol (ROBAXIN) tablet 500 mg, 500 mg, Oral, Q8H PRN, 500 mg at 03/01/23 2126 **OR** methocarbamol (ROBAXIN) 500 mg in dextrose 5 % 50 mL IVPB, 500 mg, Intravenous, Q8H PRN, Ileana Roup,  MD   morphine (PF) 2 MG/ML injection 2 mg, 2 mg, Intravenous, Q3H PRN, Barkley Boards R, PA-C   ondansetron (ZOFRAN-ODT) disintegrating tablet 4 mg, 4 mg, Oral, Q6H PRN **OR** ondansetron (ZOFRAN) injection 4 mg, 4 mg, Intravenous, Q6H PRN, Ileana Roup, MD   Oral care mouth rinse, 15 mL, Mouth Rinse, PRN, Ileana Roup, MD, 15 mL at 02/26/23 2220   simvastatin (ZOCOR) tablet 20 mg, 20 mg, Oral, q1800, Ileana Roup, MD, 20 mg at 03/01/23 1658   tamsulosin (FLOMAX) capsule 0.4 mg, 0.4 mg, Oral, Daily, Ileana Roup, MD, 0.4 mg at 03/02/23 1010   traMADol (ULTRAM) tablet 50 mg, 50 mg, Oral, Q6H PRN, Ileana Roup, MD, 50 mg at 03/01/23 2127   warfarin (COUMADIN) tablet 5 mg, 5 mg, Oral, ONCE-1600, Wilson Singer I, [facility]   Warfarin - Pharmacist Dosing Inpatient, , Does not apply, q1600, Ileana Roup, MD, Given at 03/01/23 1659   Patients Current Diet:  Diet Order                  Diet regular Room service appropriate? Yes; Fluid consistency: Thin  Diet effective now                         Precautions / Restrictions Precautions Precautions: Fall Precaution Comments: pt is a high fall risk, left clavicle fx Restrictions Weight Bearing Restrictions: Yes LUE Weight Bearing: Non weight bearing Other Position/Activity Restrictions: NWB in sling    Has the patient had 2 or more falls or a fall with injury in the past year?Yes   Prior Activity Level Community (5-7x/wk): Pt.s husband took her out for errands regularly   Prior Functional Level Prior Function Prior Level of Function : Needs assist Mobility Comments: hands on assist with rollator for walking ADLs Comments: husband assists with bathing and dressing   Self Care: Did the patient need help bathing, dressing, using the toilet or eating?  Needed some help   Indoor Mobility: Did the patient need assistance with walking from room to room (with or without device)? Needed some  help   Stairs: Did the patient need assistance with internal or external stairs (with or without device)? Needed some help   Functional Cognition: Did the patient need help planning regular tasks such as shopping or remembering to take medications? Needed some help   Patient Information Are you of Hispanic, Latino/a,or Spanish origin?: A. No, not of Hispanic, Latino/a, or Spanish origin What is your race?: A. White Do you need or want an interpreter to communicate with a doctor or health care staff?: 0. No (Answers from proxy)   Patient's Response To:  Health Literacy and Transportation Is the patient able to respond to health literacy and transportation needs?: No (proxy answered questions below ) Health Literacy - How often do you need to have someone help you when you read instructions, pamphlets, or other written material from your doctor or pharmacy?:Pt. Unable  In the past 12 months, has lack of transportation kept you from medical appointments or from getting medications?: No  In the past 12 months, has lack of transportation kept you from meetings, work, or from getting things needed for daily living?: No   Development worker, international aid / Equipment Home Equipment: Rollator (4 wheels), Grab bars - toilet, Grab bars - tub/shower, Wheelchair - manual, Transport planner   Prior Device Use: Indicate devices/aids used by the patient prior to current illness, exacerbation or injury? Walker   Current Functional Level Cognition   Arousal/Alertness: Awake/alert Overall Cognitive Status: Impaired/Different from baseline Orientation Level: Oriented X4 Following Commands: Follows one step commands with increased time Safety/Judgement: Decreased awareness of deficits, Decreased awareness of safety General Comments: Eyes remained close for most of session although able to open eyes when cued. Daughter reports pt is sensitive to light. Attention: Selective Selective Attention: Impaired Selective  Attention Impairment: Verbal complex Memory: Appears intact Awareness: Appears intact Problem Solving: Impaired Problem Solving Impairment: Verbal basic Safety/Judgment: Appears intact    Extremity Assessment (includes Sensation/Coordination)   Upper Extremity Assessment: LUE deficits/detail LUE Deficits / Details: clavicle fx, no pain and attempting to use functionally, sling aarived at end of session and donned LUE Sensation: WNL LUE Coordination: decreased gross motor  Lower Extremity Assessment: Defer to PT evaluation     ADLs   Overall ADL's : Needs assistance/impaired Eating/Feeding: NPO Grooming: Min guard, Wash/dry face, Sitting Upper Body Bathing: Moderate assistance, With caregiver independent assisting, Standing Lower Body Bathing: Maximal assistance, With caregiver independent assisting Lower Body Bathing Details (indicate cue type and reason): Pt stands in the shower and holds onto the grab bar and her husband bathes her in standing Upper Body Dressing : Moderate assistance, Sitting Upper Body Dressing Details (indicate cue type and reason): extra gown Lower Body Dressing: Min guard, Sitting/lateral leans Lower Body Dressing Details (indicate cue type and reason): able to don socks long sitting in the bed Toilet Transfer: BSC/3in1, Cueing for safety, Cueing for sequencing, Moderate assistance, Minimal assistance Toilet Transfer Details (indicate cue type and reason): step pvt, face to face transfer. Required physical assist to bring trunk forward when initiating sit to stand as well as when decending to bed surface. Once standing, pt required Min A for balance. Toileting- Clothing Manipulation and Hygiene: Moderate assistance, Cueing for safety, Cueing for sequencing, Sit to/from stand Functional mobility during ADLs: Minimal assistance, +2 for safety/equipment, +2 for physical assistance (hand held assist) General ADL Comments: Hands on family training completed with  Husband and daughter regarding toileting and toilet transfer using BSC.     Mobility   Overal bed mobility: Needs Assistance Bed Mobility: Sit to Supine Rolling: Min assist Sidelying to sit: Min assist Supine to sit: Min assist Sit to supine: Min assist Sit to sidelying: Min assist General bed mobility comments: worked with spouse and dtr on assisting pt with getting in/out of bed via log rolling to the R and pushing upwith R UE with contact cues at hips. Verbal cues to spouse to help direct and assist pt, dtr present. minA for trunk elevation with constant directional cues     Transfers   Overall transfer level: Needs assistance Equipment used: 1 person hand held assist Transfers: Sit to/from Stand, Bed to chair/wheelchair/BSC Sit to Stand: Mod assist Bed to/from chair/wheelchair/BSC transfer type:: Step pivot Step pivot transfers: Min assist General transfer comment: Family provided with VC and visual demonstration on transfer technique including gait belt use, safety awareness, and trype of directional cues to provide. When completing a step pvt transfer from/to bed and [facility], daughter completed a face to  face transfer technique. When completing a sit to stand and then walk to recliner, OT recommended standing to the right side of patient (due to LUE WB restrictions). Pt and daughter to hold hands on right and daughter's left hand to help faciliate trunk flexion to initiate sit<>stand.     Ambulation / Gait / Stairs / Wheelchair Mobility   Ambulation/Gait Ambulation/Gait assistance: Mod assist, +2 safety/equipment (chair follow) Gait Distance (Feet): 50 Feet (x2) Assistive device: 1 person hand held assist Gait Pattern/deviations: Step-to pattern, Decreased stride length, Narrow base of support General Gait Details: had spouse amb with pt providing R HHA and use of hand around waist holding onto gait belt to provide stability for first 50', s/p rest break dtr then amb pt with same  technique. verbal cues on proper hand placement and how to use whole body if needed to support pt. overall pt min/modA as pt unable to use RW due to L UE NWB Gait velocity: slow Gait velocity interpretation: <1.31 ft/sec, indicative of household ambulator     Posture / Balance Balance Overall balance assessment: Needs assistance Sitting-balance support: No upper extremity supported, Feet supported Sitting balance-Leahy Scale: Fair Postural control: Posterior lean Standing balance support: Single extremity supported Standing balance-Leahy Scale: Poor     Special needs/care consideration Special service needs sling for clavicle fx        Previous Home Environment (from acute therapy documentation) Living Arrangements: Spouse/significant other  Lives With: Spouse Available Help at Discharge: Family, Available 24 hours/day Type of Home: House Home Layout: Two level, Able to live on main level with bedroom/bathroom Alternate Level Stairs-Rails: Right, Left Alternate Level Stairs-Number of Steps: flight Home Access: Stairs to enter Entrance Stairs-Rails: None Entrance Stairs-Number of Steps: 1 Bathroom Shower/Tub: Multimedia programmer: Handicapped height Bathroom Accessibility: Yes How Accessible: Accessible via walker Additional Comments: will furniture walk or walk with husband, uses helmet for mobility at home   Discharge Living Setting Plans for Discharge Living Setting: Patient's home Type of Home at Discharge: House Discharge Home Layout: Two level, Able to live on main level with bedroom/bathroom Alternate Level Stairs-Rails: Left, Right Alternate Level Stairs-Number of Steps: full flight Discharge Home Access: Stairs to enter Entrance Stairs-Rails: None Entrance Stairs-Number of Steps: 1 Discharge Bathroom Shower/Tub: Walk-in shower Discharge Bathroom Toilet: Handicapped height Discharge Bathroom Accessibility: Yes How Accessible: Accessible via walker    Social/Family/Support Systems Patient Roles: Spouse Contact Information: [phone] Anticipated Caregiver: Ju Agreda Anticipated Caregiver's Contact Information: Very involved, can provide 24/7 min assist Ability/Limitations of Caregiver: Elderly but in good health, can provide min A Caregiver Availability: 24/7 Discharge Plan Discussed with Primary Caregiver: Yes Is Caregiver In Agreement with Plan?: Yes Does Caregiver/Family have Issues with Lodging/Transportation while Pt is in Rehab?: No     Goals Patient/Family Goal for Rehab: PT/OT/SLP Supervision to min guard  Expected length of stay: 14-16 days Pt/Family Agrees to Admission and willing to participate: Yes     Decrease burden of Care through IP rehab admission: not anticipated     Possible need for SNF placement upon discharge:none      Patient Condition: This patient's medical and functional status has changed since the consult dated: 03/02/23 in which the Rehabilitation Physician determined and documented that the patient's condition is appropriate for intensive rehabilitative care in an inpatient rehabilitation facility. See "History of Present Illness" (above) for medical update. Functional changes are: Pt. Now Mod A for transfers, Mod+2 for 20 ft of gait.. Patient's medical and functional status  update has been discussed with the Rehabilitation physician and patient remains appropriate for inpatient rehabilitation. Will admit to inpatient rehab today.   Preadmission Screen Completed By:  Genella Mech, CCC-SLP, 03/02/2023 2:16 PM ______________________________________________________________________   Discussed status with Dr. Dagoberto Ligas on 03/08/23 at 1015 and received approval for admission today.   Admission Coordinator:  Genella Mech, time 1016/Date3/12/24

## 2023-03-09 NOTE — Progress Notes (Signed)
Physical Therapy Treatment Patient Details Name: [name] MRN: [id number] DOB: 10-29-1941 Today's Date: 03/09/2023 Late Entry for 03/08/2023   History of Present Illness The pt is an 82 yo female presenting 2/29 after falling while getting out of her car. Imaging showed TBI/R SDH/contusion of R frontal lobe, and L clavicle fx. PMH includes: supranuclear palsy, BPV, HTN, GERD, CKD II, afib, HLD, mechanical aortic valve replacement on Coumadin therapy, and previous breast cancer.    PT Comments    Patient progressing with spouse capable assistance though still will benefit from AIR for higher level balance, improved safety and technique with caregiver assistance and overall strength for reduced fall risk and to maximize independence.  Planned d/c to rehab today.    Recommendations for follow up therapy are one component of a multi-disciplinary discharge planning process, led by the attending physician.  Recommendations may be updated based on patient status, additional functional criteria and insurance authorization.  Follow Up Recommendations  Acute inpatient rehab (3hours/day)     Assistance Recommended at Discharge Frequent or constant Supervision/Assistance  Patient can return home with the following A lot of help with walking and/or transfers;A lot of help with bathing/dressing/bathroom;Assistance with cooking/housework;Direct supervision/assist for medications management;Direct supervision/assist for financial management;Assist for transportation;Help with stairs or ramp for entrance   Equipment Recommendations  None recommended by PT    Recommendations for Other Services       Precautions / Restrictions Precautions Precautions: Fall Required Braces or Orthoses: Sling Restrictions LUE Weight Bearing: Non weight bearing Other Position/Activity Restrictions: NWB in sling     Mobility  Bed Mobility Overal bed mobility: Needs Assistance Bed Mobility: Supine to Sit      Supine to sit: HOB elevated, Min assist     General bed mobility comments: assist to scoot to EOB    Transfers Overall transfer level: Needs assistance Equipment used: 1 person hand held assist Transfers: Sit to/from Stand Sit to Stand: Mod assist           General transfer comment: spouse assisted for lifting help from EOB with hand hold on R    Ambulation/Gait Ambulation/Gait assistance: Mod assist Gait Distance (Feet): 80 Feet Assistive device: 1 person hand held assist Gait Pattern/deviations: Step-to pattern, Decreased stride length, Narrow base of support, Scissoring, Staggering left, Drifts right/left       General Gait Details: patient walking with spouse assist with shuffling steps and leading with R side, pt keeping R arm elevated but spouse assisting with gait belt well   Stairs             Wheelchair Mobility    Modified Rankin (Stroke Patients Only)       Balance Overall balance assessment: Needs assistance Sitting-balance support: No upper extremity supported, Feet supported Sitting balance-Leahy Scale: Fair     Standing balance support: Single extremity supported, Reliant on assistive device for balance Standing balance-Leahy Scale: Poor Standing balance comment: high fall risk without support               High Level Balance Comments: standing facing bed holding rail with R hand performed anterior/posterior rocking and up on toes x 10 for limits of stability with min A throughout            Cognition Arousal/Alertness: Awake/alert Behavior During Therapy: Flat affect Overall Cognitive Status: Impaired/Different from baseline Area of Impairment: Safety/judgement, Awareness, Problem solving, Memory, Following commands  Memory: Decreased short-term memory Following Commands: Follows one step commands with increased time Safety/Judgement: Decreased awareness of deficits, Decreased awareness of  safety   Problem Solving: Decreased initiation, Requires verbal cues, Difficulty sequencing, Slow processing          Exercises      General Comments General comments (skin integrity, edema, etc.): VSS on RA, spouse reports for d/c to rehab today      Pertinent Vitals/Pain Pain Assessment Faces Pain Scale: No hurt    Home Living                          Prior Function            PT Goals (current goals can now be found in the care plan section) Progress towards PT goals: Progressing toward goals    Frequency    Min 4X/week      PT Plan Current plan remains appropriate    Co-evaluation              AM-PAC PT "6 Clicks" Mobility   Outcome Measure  Help needed turning from your back to your side while in a flat bed without using bedrails?: A Little Help needed moving from lying on your back to sitting on the side of a flat bed without using bedrails?: A Lot Help needed moving to and from a bed to a chair (including a wheelchair)?: A Lot Help needed standing up from a chair using your arms (e.g., wheelchair or bedside chair)?: A Lot Help needed to walk in hospital room?: A Lot Help needed climbing 3-5 steps with a railing? : Total 6 Click Score: 12    End of Session Equipment Utilized During Treatment: Gait belt Activity Tolerance: Patient tolerated treatment well Patient left: in chair;with call bell/phone within reach;with family/visitor present   PT Visit Diagnosis: Other abnormalities of gait and mobility (R26.89);Muscle weakness (generalized) (M62.81);Repeated falls (R29.6);History of falling (Z91.81)     Time: [id number] PT Time Calculation (min) (ACUTE ONLY): 27 min  Charges:  $Gait Training: 8-22 mins $Therapeutic Activity: 8-22 mins                     Magda Kiel, PT Acute Rehabilitation Services Office:[phone] 03/09/2023    Reginia Naas 03/09/2023, 8:59 AM

## 2023-03-09 NOTE — Progress Notes (Signed)
Inpatient Rehabilitation Care Coordinator Assessment and Plan Patient Details  Name: [name] MRN: [id number] Date of Birth: [birth year]  Today's Date: 03/09/2023  Hospital Problems: Principal Problem:   TBI (traumatic brain injury) [facility]) Active Problems:   Traumatic subdural hematoma (SDH) ([place])   Fracture of unspecified part of left clavicle, initial encounter for closed fracture  Past Medical History:  Past Medical History:  Diagnosis Date   BPV (benign positional vertigo)    Chronic neck pain    C2-3 arthropathy   CKD (chronic kidney disease), stage II    Diverticulosis of colon    GERD (gastroesophageal reflux disease)    Hiatal hernia    History of breast cancer per pt no recurrence   dx 2004-- Left breast DCIS (ER/PR+, HER2 negative) s/p  partial masectomy w/ sln bx and  chemoradiotion (completed 2004)   History of herpes zoster    History of kidney stones    HTN (hypertension)    Hyperlipidemia    Irritable bowel syndrome    LBBB (left bundle branch block)    PAF (paroxysmal atrial fibrillation) ([place])    a. CHA2DS2VASc = 4-->coumadin.   Parkinsonism    neurologist-  dr tat   PONV (postoperative nausea and vomiting)    S/P aortic valve replacement with prosthetic valve    a. 04/13/2006 s/p St Jude mechnical AVR for severe AS;  b. 09/2014 Echo: EF 40-45%, gr1 DD, mild AI/MR, triv TR/PR.   Squamous cell carcinoma of skin 04/06/2022   right nasal sidewall   SUI (stress urinary incontinence, female)    Symptomatic anemia    Venous varices     Past Surgical History:  Past Surgical History:  Procedure Laterality Date   ANTERIOR AND POSTERIOR REPAIR  1990   cystocele/ rectocele   AORTIC VALVE REPLACEMENT  04/13/06   dr gerhardt   and Replacement Ascending Aorta (St. Jude mechanical prosthesis)   APPENDECTOMY  child 1950   BREAST EXCISIONAL BIOPSY Left    BREAST LUMPECTOMY Left 01/11/2003   malignant   BREAST SURGERY Left 2004   CARDIAC CATHETERIZATION   02-22-2001   dr Martinique   minimal non-obstructive cad/  mild aortic stenosis and aortic root enlargement/  normal LVF, ef 65%   CARDIAC CATHETERIZATION  03-22-2006    dr Martinique   no significant obstructive cad/  severe aortic stenosis and mild to moderate aortic root enlargement/  upper normal right heart pressures   CARDIOVASCULAR STRESS TEST  09-30-2011   dr Martinique   lexiscan nuclear study w/ apical thinning  vs  small prior apical infarct w/ no significant ischemia/  hypokinesis of the distal septum and apex, ef 50%   CARPAL TUNNEL RELEASE Bilateral left 09-09-2004/  right 2007   CATARACT EXTRACTION W/ INTRAOCULAR LENS  IMPLANT, BILATERAL  2015   COLONOSCOPY N/A 10/03/2014   Procedure: COLONOSCOPY;  Surgeon: [name], MD;  Location: [place];  Service: Endoscopy;  Laterality: N/A;   CYSTOSCOPY WITH RETROGRADE PYELOGRAM, URETEROSCOPY AND STENT PLACEMENT Right 09/17/2016   Procedure: CYSTOSCOPY WITH RIGHT RETROGRADE PYELOGRAM, RIGHT URETEROSCOPY AND STENT PLACEMENT LASER LITHOTRIPSY, RIGHT STENT PLACEMENT;  Surgeon: [name], MD;  Location: [facility];  Service: Urology;  Laterality: Right;   ESOPHAGEAL MANOMETRY N/A 08/12/2014   Procedure: ESOPHAGEAL MANOMETRY (EM);  Surgeon: [name], MD;  [facility];  Service: Endoscopy;  Laterality: N/A;   ESOPHAGOGASTRODUODENOSCOPY N/A 10/03/2014   Procedure: ESOPHAGOGASTRODUODENOSCOPY (EGD);  Surgeon: [name], MD;  Location:  [facility];  Service: Endoscopy;  Laterality: N/A;   ESOPHAGOGASTRODUODENOSCOPY N/A 12/08/2018   Procedure: ESOPHAGOGASTRODUODENOSCOPY (EGD);  Surgeon: Doran Stabler, MD;  Location: [place];  Service: Gastroenterology;  Laterality: N/A;   EXTRACORPOREAL SHOCK WAVE LITHOTRIPSY  1997   FOOT SURGERY Right 2013   hammertoe x2   HOLMIUM LASER APPLICATION Right [id number]   Procedure: HOLMIUM LASER APPLICATION;  Surgeon: [name], MD;  Location: [facility];  Service: Urology;  Laterality: Right;   PERCUTANEOUS NEPHROSTOLITHOTOMY  1993   STONE EXTRACTION WITH BASKET Right 09/17/2016   Procedure: STONE EXTRACTION WITH BASKET;  Surgeon: [name], MD;  Location: [facility];  Service: Urology;  Laterality: Right;   TOTAL ABDOMINAL HYSTERECTOMY  1971   w/ Bilateral Salpingoophorectomy   TRANSTHORACIC ECHOCARDIOGRAM  10/24/2014   dr Martinique   hypokinesis of the basal and mid inferoseptal and inferior walls,  ef 40-45%,  grade 1 diastolic dysfunction/  mechanical bileaflet aortic valve noted w/ mild regur., peak grandient 23mHg, valve area 1.35cm^2/  severe MV calcification without stenosis w/ mild regurg., peak gradient 326mg/  mild LAE/  poss. atrial mass (MRI showed benign lipomatous hypertrophy of atrial septum) /tFrazier ButtR/mild TR   Social History:  reports that she has never smoked. She has never used smokeless tobacco. She reports that she does not drink alcohol and does not use drugs.  Family / Support Systems Marital Status: Married How Long?: 6558ears Patient Roles: Spouse, Parent Spouse/Significant Other: GiRosanna Randyhusband) #[phone]: 3 children. PaMardene Celestedtr; [place]; [place] them. Currently staying at the hospital at night), and Tammy (dtr; [place] Other Supports: None reported Anticipated Caregiver: Husband Ability/Limitations of Caregiver: Husband is primary care Caregiver Availability: 24/7 Family Dynamics: Pt lives with her husband who provides 24.7 care. Not able to provide heavy lifting.  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 Recent Psychosocial Issues: Denies Psychiatric History: Denies Substance Abuse History: Denies  Patient / Family Perceptions, Expectations & Goals Pt/Family understanding of illness & functional limitations: Pt husband has general understanding of care needs Premorbid pt/family roles/activities: Required assistance with some ADLs/IADLs Anticipated changes in roles/activities/participation: Continued assistance required Pt/family expectations/goals: Pt would like to work on getting up and being able to be mobile. Pt husband would like for wife to improve mobility and balance, and shoulder issues.  Community Resources [employer] ScriptsNone Premorbid Home Care/DME Agencies: None Transportation available at discharge: Husband Is the patient able to respond to transportation needs?: Yes In the past 12 months, has lack of transportation kept you from medical appointments or from getting medications?: No In the past 12 months, has lack of  transportation kept you from meetings, work, or from getting things needed for daily living?: No Resource referrals recommended: Neuropsychology  Discharge Planning Living Arrangements: Spouse/significant other Support Systems: Spouse/significant other Type of Residence: Private residence Insurance Resources: English as a second language teacher (specify) Scientist, clinical (histocompatibility and immunogenetics) Medicare) Financial Resources: [place] Referred: No Living Expenses: Own Money Management: Spouse Does the patient have any problems obtaining your medications?: No Home Management: Pt reports he manages home care needs. Patient/Family Preliminary Plans: No changes Care Coordinator Barriers to Discharge: Lack of/limited family support, Decreased caregiver support, Insurance for SNF coverage Care Coordinator Anticipated Follow Up Needs: HH/OP Expected length of stay: 10-14 days  Clinical Impression SW met with pt and pt husband in room to introduce self, explain role, and discuss discharge process. Pt is not a English as a second language teacher. No HCPOA. DME- cane and RW ( uses in the home). Reports there are grab bars in main shower used, and in other bathrooms there are grab bars throughout. Intends to borrow a shower chair.   Hines Kloss A Namir Neto 03/09/2023, 1:55 PM
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
## 2023-03-10 ENCOUNTER — Inpatient Hospital Stay (HOSPITAL_COMMUNITY): Payer: Medicare HMO

## 2023-03-10 LAB — BASIC METABOLIC PANEL
Anion gap: 8 (ref 5–15)
BUN: 39 mg/dL — ABNORMAL HIGH (ref 8–23)
CO2: 24 mmol/L (ref 22–32)
Calcium: 9.3 mg/dL (ref 8.9–10.3)
Chloride: 102 mmol/L (ref 98–111)
Creatinine, Ser: 1.73 mg/dL — ABNORMAL HIGH (ref 0.44–1.00)
GFR, Estimated: 29 mL/min — ABNORMAL LOW (ref >60)
Glucose, Bld: 95 mg/dL (ref 70–99)
Potassium: 4.5 mmol/L (ref 3.5–5.1)
Sodium: 134 mmol/L — ABNORMAL LOW (ref 135–145)

## 2023-03-10 LAB — PROTIME-INR
INR: 1.9 — ABNORMAL HIGH (ref 0.8–1.2)
Prothrombin Time: 21.9 seconds — ABNORMAL HIGH (ref 11.4–15.2)

## 2023-03-10 MED ORDER — FUROSEMIDE 10 MG/ML IJ SOLN
20.0000 mg | Freq: Once | INTRAMUSCULAR | Status: AC
  Administered 2023-03-10: 20 mg via INTRAVENOUS
  Filled 2023-03-10: qty 2

## 2023-03-10 MED ORDER — ALUM & MAG HYDROXIDE-SIMETH 200-200-20 MG/5ML PO SUSP
15.0000 mL | Freq: Three times a day (TID) | ORAL | Status: DC
  Administered 2023-03-10 – 2023-03-16 (×17): 15 mL via ORAL
  Filled 2023-03-10 (×19): qty 30

## 2023-03-10 MED ORDER — FUROSEMIDE 20 MG PO TABS
20.0000 mg | ORAL_TABLET | Freq: Every day | ORAL | Status: DC
  Administered 2023-03-11 – 2023-03-16 (×6): 20 mg via ORAL
  Filled 2023-03-10 (×6): qty 1

## 2023-03-10 MED ORDER — WARFARIN SODIUM 2.5 MG PO TABS
5.0000 mg | ORAL_TABLET | Freq: Once | ORAL | Status: AC
  Administered 2023-03-10: 5 mg via ORAL
  Filled 2023-03-10: qty 2

## 2023-03-10 NOTE — Progress Notes (Signed)
[place] for warfarin  Indication: atrial fibrillation and mechanical aortic valve   Allergies  Allergen Reactions   Demerol [Meperidine] Shortness Of Breath and Swelling   Oxycontin [Oxycodone] Other (See Comments)    Hallucinations    Adhesive [Tape] Other (See Comments)    Redness and skin tears   Cozaar [Losartan] Other (See Comments)    Dizziness    Fosamax [Alendronate] Other (See Comments)    Heartburn    Ivp Dye [Iodinated Contrast Media] Hives    OK with benadryl pre-med ('50mg'$  one hour before receiving iodinated contrast agent)   Phenergan [Promethazine] Other (See Comments)    Avoids due to reaction with current medications    Patient Measurements: Height: 5' (152.4 cm) Weight: 53.5 kg (117 lb 15.1 oz) IBW/kg (Calculated) : 45.5  Vital Signs: Temp: 98.3 F (36.8 C) (03/14 0255) Temp Source: Oral (03/14 0255) BP: 152/59 (03/14 0255) Pulse Rate: 82 (03/14 0255)  Labs: Recent Labs    03/08/23 0329 03/09/23 0604 03/10/23 0556  HGB  --  8.5*  --   HCT  --  24.3*  --   PLT  --  204  --   LABPROT 24.4* 22.8* 21.9*  INR 2.2* 2.0* 1.9*  CREATININE  --  1.73*  --      Estimated Creatinine Clearance: 18 mL/min (A) (by C-G formula based on SCr of 1.73 mg/dL (H)).   Assessment: 58 YOF admitted with SDH.  Patient was on Coumadin 2.'5mg'$  all days except '5mg'$  on Thursdays PTA for history of Afib and mechanical AVR.  Neurosurgery okay with resuming anticoagulation > warfarin restarted 3/01.   3/09 CT negative for new intracranial hemorrhage, increased hematoma along R frontal scalp. OK to continue Coumadin per discussion with MD. INR trending back down 2.5 > 2.0> 1.9 over past 3 days. Hgb trending down 9.2 > 8.5. No signs bleeding reported. Will conservatively increase warfarin dose.    Goal of Therapy:  INR 2.5-3.5 (confirmed w/ husband at bedside)    Plan:  Give warfarin 5 mg PO x1 dose Check INR daily while on  warfarin Continue to monitor H&H and platelets  Octavie Westerhold A. Levada Dy, PharmD, BCPS, FNKF Clinical Pharmacist [place] Please utilize Amion for appropriate phone number to reach the unit pharmacist ([place])
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
## 2023-03-10 NOTE — Plan of Care (Signed)
Behavioral Plan   Rancho Level: Rancho 7  Behavior to decrease/ eliminate:  impulsivity  Changes to environment:  -Lights on, blinds open during the day; off and closed at night -belt alarm in chair if husband not in room, feet up -bed alarm   Interventions: -do not shut bathroom door if pt on toilet-keep an eye on patient -tell the patient to wait before getting up -gait belt with mobility -sling on LUE (NWB)   Recommendations for interactions with patient: - offer toileting with check in/appointments - hands on patient at all times with mobility and near patient when sitting EOB (poor sitting balance)   Attendees:  Jae Dire OT LaMoure Gilman Buttner PT Shari Prows RN

## 2023-03-10 NOTE — Progress Notes (Signed)
Physical Therapy Session Note  Patient Details  Name: [name] MRN: [id number] Date of Birth: 08/16/1941  Today's Date: 03/10/2023 PT Individual Time: [id number] PT Individual Time Calculation (min): 44 min  and Today's Date: 03/10/2023 PT Missed Time: 30 Minutes Missed Time Reason: Xray  Short Term Goals: Week 1:  PT Short Term Goal 1 (Week 1): Pt will complete bed mobility with minA consistently. PT Short Term Goal 2 (Week 1): Pt will perform bed to chair transfer minA PT Short Term Goal 3 (Week 1): Pt will ambulate x150' with minA consistently.  Skilled Therapeutic Interventions/Progress Updates:     Pt misses 30 minutes of skilled PT due to being off floor for chest xray. Pt received supine in bed and agrees to therapy. No complaint of pain but does mention that work of breathing is more difficulty than baseline. PT provides rest breaks as needed to manage SOB symptoms. Pt performs supine to with with minA and cues for sequencing and positioning. Pt performs stand step transfer to [facility] with R HHA modA and cues for distribution of weight, sequencing, and positioning. WC transport to gym. Pt ambulates x200' with R HHA modA, with cues to maintain upright and straightforward gaze to improve balance, as pt becomes easily distracted, tending to look at objects in L visual field, increasing postural sway and instability. Extended seated rest break. Vitals assessed in sitting with pt recording SpO2 of 99% and HR 62 bpm. Pt then ambulates x350', initially with minA R HHA and progressing to heavy modA with notable anterior bias, requiring consistent manual assistance to prevent LOB anteriorly. Immediately following bout of ambulation, vitals assessed and SpO2 at 97% and HR up to 110 bpm. Pt is noted to have significant wheezing sounds with exhalation. WC transport back to room. Pt performs ambulatory transfer to toilet with minA R HHA. Pt is independent with pericare and requires minA/modA for  return to bed, with pt attempting to crawl forward into bed and requiring increased cues for safety. Pt left supine with all needs within reach.   Therapy Documentation Precautions:  Precautions Precautions: Fall Precaution Comments: pt is a high fall risk, left clavicle fx Required Braces or Orthoses: Sling Restrictions Weight Bearing Restrictions: Yes LUE Weight Bearing: Non weight bearing Other Position/Activity Restrictions: NWB in sling   Therapy/Group: Individual Therapy  Breck Coons, PT, DPT  03/10/2023, 5:28 PM

## 2023-03-10 NOTE — Progress Notes (Addendum)
Speech Language Pathology TBI Daily Session Note  Patient Details  Name: [name] MRN: [id number] Date of Birth: 01-14-1941  Today's Date: 03/10/2023 SLP Individual Time: 1330-1420 SLP Individual Time Calculation (min): 50 min  Short Term Goals: Week 1: SLP Short Term Goal 1 (Week 1): Patient will self-monitor and correct errors during functional tasks with supervision level verbal cues. SLP Short Term Goal 2 (Week 1): Patient will demonstrate functional problem solving for basic and familiar tasks with supervision level verbal cues.  Skilled Therapeutic Interventions: Skilled treatment session focused on cognitive goals. Upon arrival, patient was awake and sitting EOB (just returned from using the commode with tech present) and transferred to the wheelchair with Max verbal cues for safety due to patient attempting to use her LUE as patient's sling was not in place properly. SLP facilitated session by providing Mod question cues for recall of 3 things that patient needs in place prior to attempting to move (sling, belt, someone on her right side). SLP created a visual aid for recall. Patient able to recall strategies at end of session when SLP discussing with husband with supervision level verbal cues but required Mod verbal cues for carryover during transfer. Patient requested to use the commode and was continent of bladder. SLP also facilitated session by providing mod-max verbal and visual cues for functional problem solving during a 6-step picture sequencing task. Patient was able to verbally problem solve throughout the task but required Max cues for sequencing the actual pictures, suspect due to impulsivity throughout task. Patient transferred back to bed at end of session and left with alarm on and all needs within reach. Continue with current plan of care.      Pain No/Denies Pain   ABS: 14  Therapy/Group: Individual Therapy  Taunya Goral 03/10/2023, 2:55 PM

## 2023-03-10 NOTE — Progress Notes (Signed)
Occupational Therapy TBI Note  Patient Details  Name: Shaine Merle MRN: [id number] Date of Birth: 02/20/1941  Today's Date: 03/10/2023 OT Individual Time: [id number] OT Individual Time Calculation (min): 72 min    Short Term Goals: Week 1:  OT Short Term Goal 1 (Week 1): Pt will transfer to toilet wiht MIN A OT Short Term Goal 2 (Week 1): Pt will don shirt wiht MOD A OT Short Term Goal 3 (Week 1): Pt will maintain dynamic standing balance with MIN A in prep for toileting OT Short Term Goal 4 (Week 1): pt will groom in standing to demo improved activity tolerance  Skilled Therapeutic Interventions/Progress Updates:    Pt received in bed with no pain reported  ADL: Pt completes ADL at overall MOD A Level. Skilled interventions include: cuing/education on waiting for this clinician to be ready to assist with mobility, cuing for adatptive dressing strategies, A for doffing/donning sling and A to wash hair/cover R forehead with waterproof dressing. Pt remains impulsive throughotu session but verbalizes "yes" when OT states she needs to wait but no functional carryover. Toileting, bathing, and dressing completed. Installed elastic shoe laces to help ipmrove independence with footwear.  Pt left at end of session in bed with exit alarm on, call light in reach and all needs met   Therapy Documentation Precautions:  Precautions Precautions: Fall Precaution Comments: pt is a high fall risk, left clavicle fx Required Braces or Orthoses: Sling Restrictions Weight Bearing Restrictions: Yes LUE Weight Bearing: Non weight bearing Other Position/Activity Restrictions: NWB in sling  Agitated Behavior Scale: TBI  Observation Details Observation Environment: CIR Start of observation period - Date: 03/10/23 Start of observation period - Time: 0815 End of observation period - Date: 03/10/23 End of observation period - Time: 0930 Agitated Behavior Scale (DO NOT LEAVE BLANKS) Short  attention span, easy distractibility, inability to concentrate: Absent Impulsive, impatient, low tolerance for pain or frustration: Absent Uncooperative, resistant to care, demanding: Absent Violent and/or threatening violence toward people or property: Absent Explosive and/or unpredictable anger: Absent Rocking, rubbing, moaning, or other self-stimulating behavior: Absent Pulling at tubes, restraints, etc.: Absent Wandering from treatment areas: Absent Restlessness, pacing, excessive movement: Absent Repetitive behaviors, motor, and/or verbal: Absent Rapid, loud, or excessive talking: Absent Sudden changes of mood: Absent Easily initiated or excessive crying and/or laughter: Absent Self-abusiveness, physical and/or verbal: Absent Agitated behavior scale total score: 14  Therapy/Group: Individual Therapy  Tonny Branch 03/10/2023, 11:15 AM

## 2023-03-11 LAB — PROTIME-INR
INR: 2 — ABNORMAL HIGH (ref 0.8–1.2)
Prothrombin Time: 22.4 seconds — ABNORMAL HIGH (ref 11.4–15.2)

## 2023-03-11 MED ORDER — WARFARIN SODIUM 2.5 MG PO TABS
5.0000 mg | ORAL_TABLET | Freq: Once | ORAL | Status: AC
  Administered 2023-03-11: 5 mg via ORAL
  Filled 2023-03-11: qty 2

## 2023-03-11 NOTE — Progress Notes (Signed)
Physical Therapy TBI Note  Patient Details  Name: [name] MRN: [id number] Date of Birth: [birth year]  Today's Date: 03/11/2023 PT Individual Time: 0900-0939 PT Individual Time Calculation (min): 39 min   Short Term Goals: Week 1:  PT Short Term Goal 1 (Week 1): Pt will complete bed mobility with minA consistently. PT Short Term Goal 2 (Week 1): Pt will perform bed to chair transfer minA PT Short Term Goal 3 (Week 1): Pt will ambulate x150' with minA consistently.  Skilled Therapeutic Interventions/Progress Updates:    Session focused on hands on family education with pt and husband in regards to bed mobility, gait in the room, and toilet transfers. Discussed home set up including side of the bed, rails, and use of pillows for support. Recommending to trial exiting on R side of bed to increase pt's ability to help while maintaining NWB status on LUE. Practice this in session and husband performed with min assist and providing appropriate cues. Short distance gait to and from bathroom including navigating ledge safely overall min assist. Provided cues for better hand and body placement to facilitiate gait and be able to better assist if LOB occurred and able to successfully return demonstrate. Performed toileting and clothing management with husband assisting as needed safely and pt using grab bar in R hand for support and balance (have grab bars at home). End of session transferred back to bed and repositioned and educated husband on use of bed features as well (at home has a regular bed so practiced with flat bed). Discussed practicing gait in future session in hallway for more open space and distance.   Therapy Documentation Precautions:  Precautions Precautions: Fall Precaution Comments: pt is a high fall risk, left clavicle fx Required Braces or Orthoses: Sling Restrictions Weight Bearing Restrictions: Yes LUE Weight Bearing: Non weight bearing Other Position/Activity  Restrictions: NWB in sling  Pain:  No c/o pain.  Agitated Behavior Scale: TBI Observation Details Observation Environment: therapy Start of observation period - Date: 03/11/23 Start of observation period - Time: 0900 End of observation period - Date: 03/11/23 End of observation period - Time: 0940 Agitated Behavior Scale (DO NOT LEAVE BLANKS) Short attention span, easy distractibility, inability to concentrate: Absent Impulsive, impatient, low tolerance for pain or frustration: Absent Uncooperative, resistant to care, demanding: Absent Violent and/or threatening violence toward people or property: Absent Explosive and/or unpredictable anger: Absent Rocking, rubbing, moaning, or other self-stimulating behavior: Absent Pulling at tubes, restraints, etc.: Absent Wandering from treatment areas: Absent Restlessness, pacing, excessive movement: Absent Repetitive behaviors, motor, and/or verbal: Absent Rapid, loud, or excessive talking: Absent Sudden changes of mood: Absent Easily initiated or excessive crying and/or laughter: Absent Self-abusiveness, physical and/or verbal: Absent Agitated behavior scale total score: 14   Therapy/Group: Individual Therapy  Canary Brim Ivory Broad, PT, DPT, CBIS  03/11/2023, 9:48 AM
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
## 2023-03-11 NOTE — IPOC Note (Signed)
Overall Plan of Care [facility]) Patient Details Name: [name] MRN: [id number] DOB: Apr 17, 1941  Admitting Diagnosis: TBI (traumatic brain injury) [facility])  Hospital Problems: Principal Problem:   TBI (traumatic brain injury) ([place]) Active Problems:   Traumatic subdural hematoma (SDH) ([place])   Fracture of unspecified part of left clavicle, initial encounter for closed fracture     Functional Problem List: Nursing Pain, Endurance, Safety, Bladder, Bowel, Medication Management  PT Balance, Endurance, Motor, Pain, Safety, Skin Integrity  OT Balance, Cognition, Behavior, Endurance, Motor, Nutrition, Safety, Sensory, Skin Integrity  SLP Cognition  TR         Basic ADL's: OT Grooming, Bathing, Dressing, Toileting     Advanced  ADL's: OT       Transfers: PT Bed Mobility, Bed to Chair, Car, Manufacturing systems engineer, Metallurgist: PT Ambulation, Stairs     Additional Impairments: OT    SLP Social Cognition   Problem Solving, Awareness  TR      Anticipated Outcomes Item Anticipated Outcome  Self Feeding S  Swallowing      Basic self-care  min UB, MOD LB  Toileting  MOD   Bathroom Transfers CGA toilet, MIN A shower  Bowel/Bladder  manage bowel and bladder w mod I assisst  Transfers  CGA  Locomotion  CGA  Communication     Cognition  Supervision  Pain  < 4 with prns  Safety/Judgment  manage w cues   Therapy Plan: PT Intensity: Minimum of 1-2 x/day ,45 to 90 minutes PT Frequency: 5 out of 7 days PT Duration Estimated Length of Stay: 10-14 Days OT Intensity: Minimum of 1-2 x/day, 45 to 90 minutes OT Frequency: 5 out of 7 days, Total of 15 hours over 7 days of combined therapies OT Duration/Estimated Length of Stay: 10-14 SLP Intensity: Minumum of 1-2 x/day, 30 to 90 minutes SLP Frequency: 1 to 3 out of 7 days SLP Duration/Estimated Length of Stay: 10-14 days   Team Interventions: Nursing Interventions Bladder Management, Pain Management,  Bowel Management, Medication Management, Cognitive Remediation/Compensation, Disease Management/Prevention, Patient/Family Education, Discharge Planning  PT interventions Ambulation/gait training, Community reintegration, DME/adaptive equipment instruction, Neuromuscular re-education, Psychosocial support, Stair training, UE/LE Strength taining/ROM, Wheelchair propulsion/positioning, Training and development officer, Discharge planning, Functional electrical stimulation, Pain management, Skin care/wound management, Therapeutic Activities, UE/LE Coordination activities, Cognitive remediation/compensation, Disease management/prevention, Patient/family education, Functional mobility training, Splinting/orthotics, Therapeutic Exercise, Visual/perceptual remediation/compensation  OT Interventions Balance/vestibular training, Cognitive remediation/compensation, Discharge planning, Disease mangement/prevention, DME/adaptive equipment instruction, Functional electrical stimulation, Functional mobility training, Neuromuscular re-education, Pain management, Patient/family education, Psychosocial support, Self Care/advanced ADL retraining, Skin care/wound managment, Splinting/orthotics, Therapeutic Activities, Therapeutic Exercise, UE/LE Strength taining/ROM, UE/LE Coordination activities, Wheelchair propulsion/positioning  SLP Interventions Cognitive remediation/compensation, English as a second language teacher, Environmental controls, Internal/external aids, Functional tasks, Patient/family education, Therapeutic Activities  TR Interventions    SW/CM Interventions Discharge Planning, Psychosocial Support, Patient/Family Education   Barriers to Discharge MD  Medical stability, Home enviroment access/loayout, Incontinence, Wound care, Lack of/limited family support, Insurance for SNF coverage, and Behavior  Nursing Decreased caregiver support, Home environment access/layout 2 level/main B+B, 1 ste, bil rails w spouse;Very involved, can  provide 24/7 min assist  Ability/Limitations of Caregiver: Elderly but in good health, can provide min A  PT      OT Weight bearing restrictions, Behavior    SLP      SW Lack of/limited family support, Decreased caregiver support, Insurance underwriter for SNF coverage     Team Discharge Planning: Destination: PT-Home ,OT- Home ,  SLP-Home Projected Follow-up: PT-Home health PT, 24 hour supervision/assistance, OT-  24 hour supervision/assistance, Home health OT, SLP-Outpatient SLP, 24 hour supervision/assistance Projected Equipment Needs: PT-To be determined, OT- None recommended by OT, SLP-None recommended by SLP Equipment Details: PT- , OT-  Patient/family involved in discharge planning: PT- Patient, Family member/caregiver,  OT-Patient, Family member/caregiver, SLP-Patient, Family member/caregiver  MD ELOS: 14-16 days Medical Rehab Prognosis:  Good Assessment: The patient has been admitted for CIR therapies with the diagnosis of TBI. The team will be addressing functional mobility, strength, stamina, balance, safety, adaptive techniques and equipment, self-care, bowel and bladder mgt, patient and caregiver education. Goals have been set at [facility] to [facility] PT/OT/SLP. Anticipated discharge destination is home.       See Team Conference Notes for weekly updates to the plan of care

## 2023-03-11 NOTE — Progress Notes (Signed)
Occupational Therapy TBI Note  Patient Details  Name: [name] MRN: [id number] Date of Birth: [birth year]  Today's Date: 03/11/2023 OT Individual Time: [id number] OT Individual Time Calculation (min): 45 min    Short Term Goals: Week 1:  OT Short Term Goal 1 (Week 1): Pt will transfer to toilet wiht MIN A OT Short Term Goal 2 (Week 1): Pt will don shirt wiht MOD A OT Short Term Goal 3 (Week 1): Pt will maintain dynamic standing balance with MIN A in prep for toileting OT Short Term Goal 4 (Week 1): pt will groom in standing to demo improved activity tolerance  Skilled Therapeutic Interventions/Progress Updates:   Pt greeted seated in w/c with husband present, pt agreeable to OT intervention. Session focus on family ed with pts husband in relation to transfer training for walk in shower. Husband reports walkin shower with seat. After great discussion, decided that there is no good place for grab bar to be installed when entering shower therefore husband to assist with gait belt to step over threshold of shower.   Pt first completed ambulatory shower transfer over threshold with MIN HHA with therapist with pt turning to R side once in the shower and then sitting on seat. Husband then able to return demo safe transfer with excellent carryover. Education provided on recommendation of having loose gown donned + gait belt for transfer at home and then once pt seated doff gown and belt for shower. Husband agreeable.   Additionally provided education on using [facility] as shower seat if needed ( current plan to borrow shower seat from friend but they already have BSC) or use BSC over toilet as husband reports thinking about getting elevated toilet seat attachment.             Ended session with pt seated in w/c with all needs within reach, husband checked off on transfers with pt in room.        Therapy Documentation Precautions:  Precautions Precautions: Fall Precaution Comments: pt is a  high fall risk, left clavicle fx Required Braces or Orthoses: Sling Restrictions Weight Bearing Restrictions: Yes LUE Weight Bearing: Non weight bearing Other Position/Activity Restrictions: NWB in sling   Pain: no pain    Agitated Behavior Scale: TBI Observation Details Observation Environment: CIR Start of observation period - Date: 03/11/23 Start of observation period - Time: 1115 End of observation period - Date: 03/11/23 End of observation period - Time: 1200 Agitated Behavior Scale (DO NOT LEAVE BLANKS) Short attention span, easy distractibility, inability to concentrate: Absent Impulsive, impatient, low tolerance for pain or frustration: Absent Uncooperative, resistant to care, demanding: Absent Violent and/or threatening violence toward people or property: Absent Explosive and/or unpredictable anger: Absent Rocking, rubbing, moaning, or other self-stimulating behavior: Absent Pulling at tubes, restraints, etc.: Absent Wandering from treatment areas: Absent Restlessness, pacing, excessive movement: Absent Repetitive behaviors, motor, and/or verbal: Absent Rapid, loud, or excessive talking: Absent Sudden changes of mood: Absent Easily initiated or excessive crying and/or laughter: Absent Self-abusiveness, physical and/or verbal: Absent Agitated behavior scale total score: 14     Therapy/Group: Individual Therapy  Corinne Ports [facility] 03/11/2023, 4:04 PM

## 2023-03-11 NOTE — Progress Notes (Signed)
Occupational Therapy TBI Note  Patient Details  Name: [name] MRN: [id number] Date of Birth: [birth year]  Today's Date: 03/11/2023 OT Individual Time: 1035-1100 OT Individual Time Calculation (min): 25 min    Short Term Goals: Week 1:  OT Short Term Goal 1 (Week 1): Pt will transfer to toilet wiht MIN A OT Short Term Goal 2 (Week 1): Pt will don shirt wiht MOD A OT Short Term Goal 3 (Week 1): Pt will maintain dynamic standing balance with MIN A in prep for toileting OT Short Term Goal 4 (Week 1): pt will groom in standing to demo improved activity tolerance  Skilled Therapeutic Interventions/Progress Updates:     Pt received in bed with no apin  ADL: Cuing for bed moility, head hips relationship for reciprocal scooting to EOB, cuing for sequence of sit to stand with reminder for wide BOS, and MINA  UB dressing MAX A LB dressing/footwear. Increased time/direct cuing to process transitional movement cues. [place] with husband about safety with mobility to/from bathroom answering any questions about toileting  Pt left at end of session in w.c with exit alarm on, call light in reach and all needs met   Therapy Documentation Precautions:  Precautions Precautions: Fall Precaution Comments: pt is a high fall risk, left clavicle fx Required Braces or Orthoses: Sling Restrictions Weight Bearing Restrictions: Yes LUE Weight Bearing: Non weight bearing Other Position/Activity Restrictions: NWB in sling Therapy/Group: Individual Therapy  Tonny Branch 03/11/2023, 12:26 PM

## 2023-03-11 NOTE — Progress Notes (Signed)
[place] for warfarin  Indication: atrial fibrillation and mechanical aortic valve   Allergies  Allergen Reactions   Demerol [Meperidine] Shortness Of Breath and Swelling   Oxycontin [Oxycodone] Other (See Comments)    Hallucinations    Adhesive [Tape] Other (See Comments)    Redness and skin tears   Cozaar [Losartan] Other (See Comments)    Dizziness    Fosamax [Alendronate] Other (See Comments)    Heartburn    Ivp Dye [Iodinated Contrast Media] Hives    OK with benadryl pre-med (50mg  one hour before receiving iodinated contrast agent)   Phenergan [Promethazine] Other (See Comments)    Avoids due to reaction with current medications    Patient Measurements: Height: 5' (152.4 cm) Weight: 51.3 kg (113 lb 1.5 oz) IBW/kg (Calculated) : 45.5  Vital Signs: Temp: 97.6 F (36.4 C) (03/15 0342) Temp Source: Oral (03/15 0342) BP: 131/60 (03/15 0342) Pulse Rate: 85 (03/15 0342)  Labs: Recent Labs    03/09/23 0604 03/10/23 0556 03/11/23 0717  HGB 8.5*  --   --   HCT 24.3*  --   --   PLT 204  --   --   LABPROT 22.8* 21.9* 22.4*  INR 2.0* 1.9* 2.0*  CREATININE 1.73* 1.73*  --      Estimated Creatinine Clearance: 18 mL/min (A) (by C-G formula based on SCr of 1.73 mg/dL (H)).   Assessment: 52 YOF admitted with SDH.  Patient was on Coumadin 2.5mg  all days except 5mg  on Thursdays PTA for history of Afib and mechanical AVR.  Neurosurgery okay with resuming anticoagulation > warfarin restarted 3/01.   3/09 CT negative for new intracranial hemorrhage, increased hematoma along R frontal scalp. OK to continue Coumadin per discussion with MD. INR stable 2.1>1.9>2, but subtherapeutic. Hgb trending down 9.2 > 8.5. No signs bleeding reported. Will conservatively increase warfarin dose.    Goal of Therapy:  INR 2.5-3.5 (confirmed w/ husband at bedside)    Plan:  Give warfarin 5 mg PO x1 dose again today Check INR daily while on  warfarin Continue to monitor H&H and platelets  [name], PharmD, BCPS, FNKF Clinical Pharmacist [place] Please utilize Amion for appropriate phone number to reach the unit pharmacist ([place])

## 2023-03-11 NOTE — Progress Notes (Signed)
Physical Therapy Session Note  Patient Details  Name: [name] MRN: [id number] Date of Birth: [birth year]  Today's Date: 03/11/2023 PT Individual Time: [id number] PT Individual Time Calculation (min): 39 min   Short Term Goals: Week 1:  PT Short Term Goal 1 (Week 1): Pt will complete bed mobility with minA consistently. PT Short Term Goal 2 (Week 1): Pt will perform bed to chair transfer minA PT Short Term Goal 3 (Week 1): Pt will ambulate x150' with minA consistently.  Skilled Therapeutic Interventions/Progress Updates:     Pt received getting up from toilet with husband assisting. No complaint of pain. Pt ambulates to [facility] with husband assisting. PT provides education to husband on importance of utilizing underhand grip on gait belt for more secure hold and ability to assist pt. Husband verbalizes understanding. WC transport to gym for time management. Pt ambulates 2x175' with R HHA and minA overall, with cues for upright posture to improve balance, as well as redirection to task, as pt tends to become externally distracted and gaze no consistently on path ahead of her, rather looking at objects in L visual field. Seated rest breaks between bouts. Pt then performs forward and backward ambulation with R HHA to work on dynamic gait and balance. Pt requires minA for forward ambulation and modA/maxA for backward ambulation, with tendency for posterior lean and LOBs, despite max cueing for trunk orientation to improve center of gravity and balance. WC transport back to room. Left seated with all needs within reach.   Therapy Documentation Precautions:  Precautions Precautions: Fall Precaution Comments: pt is a high fall risk, left clavicle fx Required Braces or Orthoses: Sling Restrictions Weight Bearing Restrictions: Yes LUE Weight Bearing: Non weight bearing Other Position/Activity Restrictions: NWB in sling    Therapy/Group: Individual Therapy  Breck Coons, PT,  DPT 03/11/2023, 5:11 PM

## 2023-03-11 NOTE — Progress Notes (Signed)
Met with patient and husband at bedside. Aware of team conference every week. Discussed education binder with him and reviewed. Documents daily weight and b/p at home for PCP. He monitors INR via MyChart. Updated board with L clavicle fx NWB with sling. Encourage to increase Ca and protein intake for bone health and wound healing. Sutures removed to right forehead according to husband. All needs met.

## 2023-03-11 NOTE — Progress Notes (Addendum)
Speech Language Pathology Daily TBI Session Note  Patient Details  Name: [name] MRN: [id number] Date of Birth: January 11, 1941  Today's Date: 03/11/2023 SLP Individual Time: 1115-1200 SLP Individual Time Calculation (min): 45 min  Short Term Goals: Week 1: SLP Short Term Goal 1 (Week 1): Patient will self-monitor and correct errors during functional tasks with supervision level verbal cues. SLP Short Term Goal 2 (Week 1): Patient will demonstrate functional problem solving for basic and familiar tasks with supervision level verbal cues.  Skilled Therapeutic Interventions: Skilled treatment session focused on cognitive goals. Upon arrival, patient was awake while upright in the wheelchair. Patient appeared awake and alert throughout session with increased initiation of verbal expression and overall social interaction. Patient independently recalled the 3 things she needs to have implemented prior to mobility and also recalled events from previous therapy sessions with overall supervision level verbal cues. Patient and husband report that they enjoy playing board games and card games at home. Therefore, SLP facilitated session by providing overall Min verbal cues for recall and problem solving during a mildly complex and novel card task. Patient able to recall procedures of task and implement an appropriate strategy with overall Min verbal cues. Patient left upright in wheelchair with alarm on and all needs within reach. Continue with current plan of care.      Pain No/Denies Pain   ABS: 14  Therapy/Group: Individual Therapy  [name] 03/11/2023, 3:26 PM

## 2023-03-11 NOTE — Progress Notes (Signed)
Patient up in room using IS. Encouraged patient to use IS every 30 minutes while awake. Denies pain or discomfort at this time.

## 2023-03-12 LAB — PROTIME-INR
INR: 2.2 — ABNORMAL HIGH (ref 0.8–1.2)
Prothrombin Time: 24.6 seconds — ABNORMAL HIGH (ref 11.4–15.2)

## 2023-03-12 MED ORDER — ALBUTEROL SULFATE (2.5 MG/3ML) 0.083% IN NEBU
2.5000 mg | INHALATION_SOLUTION | Freq: Once | RESPIRATORY_TRACT | Status: AC
  Administered 2023-03-12: 2.5 mg via RESPIRATORY_TRACT

## 2023-03-12 MED ORDER — WARFARIN SODIUM 2.5 MG PO TABS
5.0000 mg | ORAL_TABLET | Freq: Once | ORAL | Status: AC
  Administered 2023-03-12: 5 mg via ORAL
  Filled 2023-03-12 (×3): qty 2

## 2023-03-12 MED ORDER — ALBUTEROL SULFATE (2.5 MG/3ML) 0.083% IN NEBU
INHALATION_SOLUTION | RESPIRATORY_TRACT | Status: AC
  Filled 2023-03-12: qty 3

## 2023-03-12 NOTE — Progress Notes (Signed)
Noted wheezing from patient. Nurse called Dewitt Hoes PA and a one time albuterol treatment was order and performed by respiratory.
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
## 2023-03-13 LAB — PROTIME-INR
INR: 2.3 — ABNORMAL HIGH (ref 0.8–1.2)
Prothrombin Time: 25.1 seconds — ABNORMAL HIGH (ref 11.4–15.2)

## 2023-03-13 MED ORDER — WARFARIN SODIUM 4 MG PO TABS
4.0000 mg | ORAL_TABLET | Freq: Once | ORAL | Status: AC
  Administered 2023-03-13: 4 mg via ORAL
  Filled 2023-03-13: qty 1

## 2023-03-13 MED ORDER — NAPHAZOLINE-GLYCERIN 0.012-0.25 % OP SOLN
1.0000 [drp] | Freq: Four times a day (QID) | OPHTHALMIC | Status: DC | PRN
  Administered 2023-03-15: 1 [drp] via OPHTHALMIC
  Filled 2023-03-13: qty 15

## 2023-03-13 NOTE — Progress Notes (Signed)
Occupational Therapy Session Note  Patient Details  Name: [name] MRN: [id number] Date of Birth: 30-Oct-1941  {CHL IP REHAB OT TIME CALCULATIONS:[id number]}   Short Term Goals: Week 1:  OT Short Term Goal 1 (Week 1): Pt will transfer to toilet wiht MIN A OT Short Term Goal 2 (Week 1): Pt will don shirt wiht MOD A OT Short Term Goal 3 (Week 1): Pt will maintain dynamic standing balance with MIN A in prep for toileting OT Short Term Goal 4 (Week 1): pt will groom in standing to demo improved activity tolerance  Skilled Therapeutic Interventions/Progress Updates:  Pt received *** for skilled OT session with focus on ***. Pt agreeable to interventions, demonstrating overall *** mood. Pt reported ***/10 pain, stating "***" in reference to ***. OT offering intermediate rest breaks and positioning suggestions throughout session to address pain/fatigue and maximize participation/safety in session.    Pt remained *** with all immediate needs met at end of session. Pt continues to be appropriate for skilled OT intervention to promote further functional independence.   Therapy Documentation Precautions:  Precautions Precautions: Fall Precaution Comments: pt is a high fall risk, left clavicle fx Required Braces or Orthoses: Sling Restrictions Weight Bearing Restrictions: Yes LUE Weight Bearing: Non weight bearing Other Position/Activity Restrictions: NWB in sling   Therapy/Group: Individual Therapy  Maudie Mercury, OTR/L, MSOT  03/13/2023, 9:01 PM

## 2023-03-13 NOTE — Progress Notes (Signed)
Speech Language Pathology Daily Session Note  Patient Details  Name: [name] MRN: [id number] Date of Birth: 04/27/1941  Today's Date: 03/13/2023 SLP Individual Time: 1105-1200 SLP Individual Time Calculation (min): 55 min  Short Term Goals: Week 1: SLP Short Term Goal 1 (Week 1): Patient will self-monitor and correct errors during functional tasks with supervision level verbal cues. SLP Short Term Goal 2 (Week 1): Patient will demonstrate functional problem solving for basic and familiar tasks with supervision level verbal cues.  Skilled Therapeutic Interventions:  Pt was seen for skilled ST targeting cognitive goals.  Upon arrival she was seated in her wheelchair with husband at bedside, awake, and agreeable to participating in treatment.  Pt was transported to [facility] treatment room for appointment.  SLP facilitated the session with a picture description task targeting verbal identification and problem solving of safety concerns.  Pt was initially able to identify safety concerns and generate appropriate solutions to problems with min question cues; however, she became very drowsy and needed frequent use of light touch to awaken.  Pt was noted to be much more animated and alert when engaged in loosely structured conversations regarding her personal interests and family.  Pt reported that she likes to play games at home [employer]ley Boards, Crazy 8s, Gin Rummy) and agreed to participate in a card game with SLP.  Pt played a game of [place] with no more than min cues needed for functional problem solving due to decreased working memory of task rules.  Pt was also able to verbally recall the name and rules of a card game targeted in previous therapy sessions with mod question cues.  Pt was returned to her room and left in wheelchair with husband at bedside.  Continue per current plan of care.     Pain Pain Assessment Pain Scale: 0-10 Pain Score: 0-No pain  Therapy/Group: Individual Therapy  Adrielle Polakowski,  Selinda Orion 03/13/2023, 2:57 PM
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
## 2023-03-13 NOTE — Progress Notes (Signed)
Physical Therapy TBI Note  Patient Details  Name: [name] MRN: [id number] Date of Birth: 09/16/1941  Today's Date: 03/13/2023 PT Individual Time: 0915-1011 PT Individual Time Calculation (min): 56 min   Short Term Goals: Week 1:  PT Short Term Goal 1 (Week 1): Pt will complete bed mobility with minA consistently. PT Short Term Goal 2 (Week 1): Pt will perform bed to chair transfer minA PT Short Term Goal 3 (Week 1): Pt will ambulate x150' with minA consistently.  Skilled Therapeutic Interventions/Progress Updates:    Chart reviewed and pt agreeable to therapy. Pt received semi-reclined in bed with no c/o pain. Also of note, husband in room and explained that plan for home is for him to amb with her at necessary assist level 2/2 pt's posterior lean from PSP. Husband reports he previously provided hands on CGA when pt used rollator prior to fall. Session focused on transfer and amb endurance and safety with husband support to promote safe caregiver support and home access. Pt initiated session with transfer to EOB and to [place] using MinA + HHA of husband + CGA of PT. PT provided VC to husband for safe gait belt hand position (undergrip). Pt then taken to therapy room for time management. In gym, pt then completed 4 rounds of amb at 73ft + 76 ft + 70ft + 26ft with MinA + HHA of husband + CGA progressing to supervision of PT. Husband noted to use safe communication with pt during turns and standing transfers. Husband also maintained safe positioning during ambulation. Pt then returned to room and completed 2x10 high knee marches with MinA HHA of PT for strength progression for stair training. Pt also completed neutral stance with CGA of PT for 2x30sec. Session education emphasized plan for home entrance up stairs, with plan to observe caregiver support for final recommendations. At end of session, pt was left seated in [facility] with nurse call bell and all needs in reach.     Therapy  Documentation Precautions:  Precautions Precautions: Fall Precaution Comments: pt is a high fall risk, left clavicle fx Required Braces or Orthoses: Sling Restrictions Weight Bearing Restrictions: Yes LUE Weight Bearing: Non weight bearing Other Position/Activity Restrictions: NWB in sling General:      Agitated Behavior Scale: TBI Observation Details Observation Environment: pt room Start of observation period - Date: 03/13/23 Start of observation period - Time: 0915 End of observation period - Date: 03/13/23 End of observation period - Time: 1011 Agitated Behavior Scale (DO NOT LEAVE BLANKS) Short attention span, easy distractibility, inability to concentrate: Absent Impulsive, impatient, low tolerance for pain or frustration: Absent Uncooperative, resistant to care, demanding: Absent Violent and/or threatening violence toward people or property: Absent Explosive and/or unpredictable anger: Absent Rocking, rubbing, moaning, or other self-stimulating behavior: Absent Pulling at tubes, restraints, etc.: Absent Wandering from treatment areas: Absent Restlessness, pacing, excessive movement: Absent Repetitive behaviors, motor, and/or verbal: Absent Rapid, loud, or excessive talking: Absent Sudden changes of mood: Absent Easily initiated or excessive crying and/or laughter: Absent Self-abusiveness, physical and/or verbal: Absent Agitated behavior scale total score: 14     Therapy/Group: Individual Therapy  Marquette Old, PT, DPT 03/13/2023, 10:20 AM

## 2023-03-13 NOTE — Progress Notes (Signed)
Occupational Therapy Session Note  Patient Details  Name: [name] MRN: [id number] Date of Birth: 10-14-1941  Today's Date: 03/13/2023 OT Individual Time: 0215-0245 OT Individual Time Calculation (min): 30 min    Short Term Goals: Week 1:  OT Short Term Goal 1 (Week 1): Pt will transfer to toilet wiht MIN A OT Short Term Goal 2 (Week 1): Pt will don shirt wiht MOD A OT Short Term Goal 3 (Week 1): Pt will maintain dynamic standing balance with MIN A in prep for toileting OT Short Term Goal 4 (Week 1): pt will groom in standing to demo improved activity tolerance  Skilled Therapeutic Interventions/Progress Updates:    Pt asleep in bed, husband at bedside.  Per husband, pt fell asleep mid bite during lunch and had to be transferred back to bed due to drowsiness.  Therapist attempted to arouse pt by opening blinds for increased natural light, cold wet washcloth to face ( patient pushing washcloth away but still appearing to be asleep), nailbed pressure, and elevating HOB. Patient remained asleep despite these measures.  Vitals assessed: BP 148/59, pulse 70, O2 on RA 99%.  Notified nurse and charge nurse of patients presentation.  Repositioned pt with the help of her husband towards [place], re adjusted sling for proper fit, and supported with pillows.  Call bell in reach, bed alarm on at end of session.  Missed 45 minutes of treatment due to significant lethargy.    Therapy Documentation Precautions:  Precautions Precautions: Fall Precaution Comments: pt is a high fall risk, left clavicle fx Required Braces or Orthoses: Sling Restrictions Weight Bearing Restrictions: Yes LUE Weight Bearing: Non weight bearing Other Position/Activity Restrictions: NWB in sling    Therapy/Group: Individual Therapy  Ezekiel Slocumb 03/13/2023, 2:53 PM

## 2023-03-13 NOTE — Progress Notes (Signed)
[place] for warfarin  Indication: atrial fibrillation and mechanical aortic valve   Allergies  Allergen Reactions   Demerol [Meperidine] Shortness Of Breath and Swelling   Oxycontin [Oxycodone] Other (See Comments)    Hallucinations    Adhesive [Tape] Other (See Comments)    Redness and skin tears   Cozaar [Losartan] Other (See Comments)    Dizziness    Fosamax [Alendronate] Other (See Comments)    Heartburn    Ivp Dye [Iodinated Contrast Media] Hives    OK with benadryl pre-med (50mg  one hour before receiving iodinated contrast agent)   Phenergan [Promethazine] Other (See Comments)    Avoids due to reaction with current medications   Patient Measurements: Height: 5' (152.4 cm) Weight: 51.8 kg (114 lb 1.6 oz) IBW/kg (Calculated) : 45.5  Vital Signs: Temp: 97.7 F (36.5 C) (03/17 0540) Temp Source: Oral (03/17 0540) BP: 141/69 (03/17 0540) Pulse Rate: 72 (03/17 0540)  Labs: Recent Labs    03/11/23 0717 03/12/23 0708 03/13/23 0549  LABPROT 22.4* 24.6* 25.1*  INR 2.0* 2.2* 2.3*    Estimated Creatinine Clearance: 18 mL/min (A) (by C-G formula based on SCr of 1.73 mg/dL (H)).  Assessment: 27 YOF admitted with SDH.  Patient was on Coumadin 2.5mg  all days except 5mg  on Thursdays PTA for history of Afib and mechanical AVR.  Neurosurgery okay with resuming anticoagulation > warfarin restarted 3/01.   3/09 CT negative for new intracranial hemorrhage, increased hematoma along R frontal scalp. OK to continue Coumadin per discussion with MD.  INR remains subtherapeutic this morning at 2.3. Most recent Hbg 8.5, platelets are WNL. No signs or symptoms of bleeding reported.  Goal of Therapy:  INR 2.5-3.5 (confirmed w/ husband at bedside)    Plan:  Give warfarin 4 mg x1 this afternoon Monitor daily INR, weekly CBC Monitor signs and symptoms of bleeding  Louanne Belton, PharmD PGY1 Pharmacy Resident 03/13/2023 9:10 AM

## 2023-03-14 DIAGNOSIS — G4700 Insomnia, unspecified: Secondary | ICD-10-CM

## 2023-03-14 DIAGNOSIS — S069X9D Unspecified intracranial injury with loss of consciousness of unspecified duration, subsequent encounter: Secondary | ICD-10-CM

## 2023-03-14 DIAGNOSIS — N182 Chronic kidney disease, stage 2 (mild): Secondary | ICD-10-CM

## 2023-03-14 DIAGNOSIS — I5022 Chronic systolic (congestive) heart failure: Secondary | ICD-10-CM

## 2023-03-14 DIAGNOSIS — I10 Essential (primary) hypertension: Secondary | ICD-10-CM

## 2023-03-14 LAB — CBC
HCT: 24 % — ABNORMAL LOW (ref 36.0–46.0)
Hemoglobin: 8.1 g/dL — ABNORMAL LOW (ref 12.0–15.0)
MCH: 33.9 pg (ref 26.0–34.0)
MCHC: 33.8 g/dL (ref 30.0–36.0)
MCV: 100.4 fL — ABNORMAL HIGH (ref 80.0–100.0)
Platelets: 256 10*3/uL (ref 150–400)
RBC: 2.39 MIL/uL — ABNORMAL LOW (ref 3.87–5.11)
RDW: 13.7 % (ref 11.5–15.5)
WBC: 8 10*3/uL (ref 4.0–10.5)
nRBC: 0 % (ref 0.0–0.2)

## 2023-03-14 LAB — BASIC METABOLIC PANEL
Anion gap: 9 (ref 5–15)
BUN: 39 mg/dL — ABNORMAL HIGH (ref 8–23)
CO2: 28 mmol/L (ref 22–32)
Calcium: 9 mg/dL (ref 8.9–10.3)
Chloride: 97 mmol/L — ABNORMAL LOW (ref 98–111)
Creatinine, Ser: 1.67 mg/dL — ABNORMAL HIGH (ref 0.44–1.00)
GFR, Estimated: 30 mL/min — ABNORMAL LOW (ref >60)
Glucose, Bld: 95 mg/dL (ref 70–99)
Potassium: 4 mmol/L (ref 3.5–5.1)
Sodium: 134 mmol/L — ABNORMAL LOW (ref 135–145)

## 2023-03-14 LAB — PROTIME-INR
INR: 2.4 — ABNORMAL HIGH (ref 0.8–1.2)
Prothrombin Time: 26.1 seconds — ABNORMAL HIGH (ref 11.4–15.2)

## 2023-03-14 MED ORDER — WARFARIN SODIUM 4 MG PO TABS
4.0000 mg | ORAL_TABLET | Freq: Once | ORAL | Status: AC
  Administered 2023-03-14: 4 mg via ORAL
  Filled 2023-03-14: qty 1

## 2023-03-14 NOTE — Progress Notes (Signed)
[place] for warfarin  Indication: atrial fibrillation and mechanical aortic valve   Allergies  Allergen Reactions   Demerol [Meperidine] Shortness Of Breath and Swelling   Oxycontin [Oxycodone] Other (See Comments)    Hallucinations    Adhesive [Tape] Other (See Comments)    Redness and skin tears   Cozaar [Losartan] Other (See Comments)    Dizziness    Fosamax [Alendronate] Other (See Comments)    Heartburn    Ivp Dye [Iodinated Contrast Media] Hives    OK with benadryl pre-med (50mg  one hour before receiving iodinated contrast agent)   Phenergan [Promethazine] Other (See Comments)    Avoids due to reaction with current medications   Patient Measurements: Height: 5' (152.4 cm) Weight: 51.4 kg (113 lb 5.1 oz) IBW/kg (Calculated) : 45.5  Vital Signs: Temp: 98.5 F (36.9 C) (03/18 0446) Temp Source: Oral (03/18 0446) BP: 149/61 (03/18 0446) Pulse Rate: 86 (03/18 0446)  Labs: Recent Labs    03/12/23 0708 03/13/23 0549 03/14/23 0725  HGB  --   --  8.1*  HCT  --   --  24.0*  PLT  --   --  256  LABPROT 24.6* 25.1* 26.1*  INR 2.2* 2.3* 2.4*    Estimated Creatinine Clearance: 18 mL/min (A) (by C-G formula based on SCr of 1.73 mg/dL (H)).  Assessment: 4 YOF admitted with SDH.  Patient was on Coumadin 2.5mg  all days except 5mg  on Thursdays PTA for history of Afib and mechanical AVR.  Neurosurgery okay with resuming anticoagulation > warfarin restarted 3/01.   3/09 CT negative for new intracranial hemorrhage, increased hematoma along R frontal scalp. OK to continue Coumadin per discussion with MD.  INR remains subtherapeutic this morning at 2.4, slight increase toward goal today.. Hbg 8.1, platelets are WNL. No signs or symptoms of bleeding reported.  Goal of Therapy:  INR 2.5-3.5 (confirmed w/ husband at bedside)    Plan:  Give warfarin 4 mg x1 this afternoon Monitor daily INR, weekly CBC Monitor signs and symptoms of  bleeding  Nicole Cella, RPh Clinical Pharmacist 03/14/2023 8:05 AM Please check AMION for all [place] phone numbers After 10:00 PM, call [place] [phone]

## 2023-03-14 NOTE — Progress Notes (Signed)
Physical Therapy TBI Note  Patient Details  Name: [name] MRN: [id number] Date of Birth: 03/21/1941  Today's Date: 03/14/2023 PT Individual Time: 1104-1200 PT Individual Time Calculation (min): 56 min   Short Term Goals: Week 1:  PT Short Term Goal 1 (Week 1): Pt will complete bed mobility with minA consistently. PT Short Term Goal 2 (Week 1): Pt will perform bed to chair transfer minA PT Short Term Goal 3 (Week 1): Pt will ambulate x150' with minA consistently.  Skilled Therapeutic Interventions/Progress Updates:     Pt received seated in recliner and agrees to therapy. No complaint of pain. Pt appears more alert and energetic than previous sessions with this therapist. Pt performs sit to stand from recliner with cues for  body mechanics and minA, R HHA. Pt perform stand step transfer to [facility] with minA and cues for sequencing and and hand placement. WC transport to gym. Pt performs sit to stand with cues for optimal hand grip with R hand, then ambulates x175' with minA R HHA, with cues for upright and straight forward gaze to improve balance, and increasing stride , as pt noted to slightly drag feet during swing phase with fatigue. Pt takes extended seated rest break then completes additional x175' with similar cues and assistance, with increased cueing and education on safe transition back to [facility], as pt tends to have increased difficulty with balance as she is approaching her WC or other surface on which to sit.   Pt performs alternating toe taps on 6" step with R HHA and miNA/modA, with cues for anterior weight shift due to pt tendency to keep center of gravity toward heels with slight posterior bias. Pt completes x20 with each foot, then steps backward x1' with cues for sequencing and posture. Following rest reak, pt completes x30 total with improved balance, though continues to require minA overall.  Pt ambulates x150' back to room with minA R HHA. Cues provided for sequencing of  transition back to recliner for safety. Left seated with all needs within reach.   Therapy Documentation Precautions:  Precautions Precautions: Fall Precaution Comments: pt is a high fall risk, left clavicle fx Required Braces or Orthoses: Sling Restrictions Weight Bearing Restrictions: Yes LUE Weight Bearing: Non weight bearing Other Position/Activity Restrictions: NWB in sling    Therapy/Group: Individual Therapy  Breck Coons, PT, DPT 03/14/2023, 5:05 PM

## 2023-03-14 NOTE — Progress Notes (Signed)
Occupational Therapy TBI Note  Patient Details  Name: [name] MRN: [id number] Date of Birth: [birth year]  Today's Date: 03/14/2023 OT Individual Time: [id number] OT Individual Time Calculation (min): 72 min    Short Term Goals: Week 1:  OT Short Term Goal 1 (Week 1): Pt will transfer to toilet wiht MIN A OT Short Term Goal 2 (Week 1): Pt will don shirt wiht MOD A OT Short Term Goal 3 (Week 1): Pt will maintain dynamic standing balance with MIN A in prep for toileting OT Short Term Goal 4 (Week 1): pt will groom in standing to demo improved activity tolerance  Skilled Therapeutic Interventions/Progress Updates:    Patient agreeable to participate in OT session. Reports 0/10 pain level.   Patient participated in skilled OT session focusing on ADL re-training, functional transfers/mobility, family training. Husband present during session and was educated throughout session while performing hands on practice when appropriate. Husband completed functional mobility from bed to walk-in shower while providing HHA. Therapist assisted with bathing and dressing. Pt completed UB and LB dressing seated on bench. Pt used LUE minimally to complete LB bathing with no pain reported. Utilized grab bar when standing up in shower to wash buttocks with assist. Functional mobility completed from shower to seated EOB to complete dressing. Pt provided with education on compensatory dressing techniques to utilize due to left clavicle fx. Pt provided with increased physical assist overall during dressing tasks due to restrictions of LUE.   Pt participated in sit to stand transitions focusing on proper form and technique in order to decrease risk of posterior fall and allow for husband to assist pt with increased safety. 2 sets, 5X completed. Pt provided with VC and manual facilitation each time to flex hips and bring trunk forward when transitioning  stand<>sit.       Therapy Documentation Precautions:   Precautions Precautions: Fall Precaution Comments: pt is a high fall risk, left clavicle fx Required Braces or Orthoses: Sling Restrictions Weight Bearing Restrictions: Yes LUE Weight Bearing: Non weight bearing Other Position/Activity Restrictions: NWB in sling  Agitated Behavior Scale: TBI Observation Details Observation Environment: pt's room Start of observation period - Date: 03/13/23 Start of observation period - Time: 0803 End of observation period - Date: 03/14/23 End of observation period - Time: 0915 Agitated Behavior Scale (DO NOT LEAVE BLANKS) Short attention span, easy distractibility, inability to concentrate: Absent Impulsive, impatient, low tolerance for pain or frustration: Absent Uncooperative, resistant to care, demanding: Absent Violent and/or threatening violence toward people or property: Absent Explosive and/or unpredictable anger: Absent Rocking, rubbing, moaning, or other self-stimulating behavior: Absent Pulling at tubes, restraints, etc.: Absent Wandering from treatment areas: Absent Restlessness, pacing, excessive movement: Absent Repetitive behaviors, motor, and/or verbal: Absent Rapid, loud, or excessive talking: Absent Sudden changes of mood: Absent Easily initiated or excessive crying and/or laughter: Absent Self-abusiveness, physical and/or verbal: Absent Agitated behavior scale total score: 14   Therapy/Group: Individual Therapy  Ailene Ravel, OTR/L,CBIS  Supplemental OT - [facility] and [facility] Secure Chat Preferred   03/14/2023, 12:22 PM

## 2023-03-14 NOTE — Progress Notes (Signed)
Speech Language Pathology Daily Session Note  Patient Details  Name: [name] MRN: [id number] Date of Birth: 06/19/1941  Today's Date: 03/14/2023 SLP Individual Time: 1300-1345 SLP Individual Time Calculation (min): 45 min  Short Term Goals: Week 1: SLP Short Term Goal 1 (Week 1): Patient will self-monitor and correct errors during functional tasks with supervision level verbal cues. SLP Short Term Goal 2 (Week 1): Patient will demonstrate functional problem solving for basic and familiar tasks with supervision level verbal cues.  Skilled Therapeutic Interventions: Pt was seen in PM to address cognitive re- training. Pt was alert and seated upright in WC upon SLP arrival. Pt's husband present and contributed intermittently throughout session. SLP addressed problem solving within current and least restrictive environment. Pt able to express PLOF including preferred activities and things her husband assisted her with. Husband able to contribute as necessary regarding support he provided to pt. Pt verbalized current limitations including not using her left arm. She also verbalized 1 of 3 things needed before mobility indep improving to 2 of 3 with min A. In missed opportunity, SLP provided correct response and rationale. Pt identified x3 things needed for safe mobility later in session with 100% acc with Sup. SLP challenged pt to identify unsafe situations given problem solving cards. Pt identified unsafe situations with 100% acc with mod I. She identified a solution with 75% acc with minA. In missed opportunity, SLP provided an appropriate response and rationale. Pt left alert and seated upright In recliner with husband present and call button within reach. SLP plan to continue POC.   Pain No pain  Therapy/Group: Individual Therapy  Colin Benton 03/14/2023, 4:13 PM
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
## 2023-03-15 LAB — CBC
HCT: 24.8 % — ABNORMAL LOW (ref 36.0–46.0)
Hemoglobin: 8.2 g/dL — ABNORMAL LOW (ref 12.0–15.0)
MCH: 33.1 pg (ref 26.0–34.0)
MCHC: 33.1 g/dL (ref 30.0–36.0)
MCV: 100 fL (ref 80.0–100.0)
Platelets: 268 10*3/uL (ref 150–400)
RBC: 2.48 MIL/uL — ABNORMAL LOW (ref 3.87–5.11)
RDW: 13.6 % (ref 11.5–15.5)
WBC: 7.5 10*3/uL (ref 4.0–10.5)
nRBC: 0 % (ref 0.0–0.2)

## 2023-03-15 LAB — PROTIME-INR
INR: 2.4 — ABNORMAL HIGH (ref 0.8–1.2)
Prothrombin Time: 26.2 seconds — ABNORMAL HIGH (ref 11.4–15.2)

## 2023-03-15 MED ORDER — WARFARIN SODIUM 2.5 MG PO TABS
5.0000 mg | ORAL_TABLET | Freq: Once | ORAL | Status: AC
  Administered 2023-03-15: 5 mg via ORAL
  Filled 2023-03-15: qty 2
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
## 2023-03-15 NOTE — Patient Care Conference (Signed)
Inpatient RehabilitationTeam Conference and Plan of Care Update Date: 03/15/2023   Time: 10:05 AM    Patient Name: [name]      Medical Record Number: [id number]  Date of Birth: Aug 31, 1941 Sex: Female         Room/Bed: 4W03C/4W03C-01 Payor Info: Payor: [employer] MEDICARE / Plan: [place] MEDICARE HMO/PPO / Product Type: *No Product type* /    Admit Date/Time:  03/08/2023  3:03 PM  Primary Diagnosis:  TBI (traumatic brain injury) [facility])  Hospital Problems: Principal Problem:   TBI (traumatic brain injury) ([place]) Active Problems:   Traumatic subdural hematoma (SDH) ([place])   Fracture of unspecified part of left clavicle, initial encounter for closed fracture    Expected Discharge Date: Expected Discharge Date: 03/16/23  Team Members Present: Physician leading conference: Dr. Durel Salts Social Worker Present: Loralee Pacas, [place] Nurse Present: Tacy Learn, RN PT Present: Tereasa Coop, PT OT Present: Mariane Masters, OT SLP Present: Weston Anna, SLP PPS Coordinator present : Gunnar Fusi, SLP     Current Status/Progress Goal Weekly Team Focus  Bowel/Bladder   Continent of bowel and bladder.   Remain continent   Assist with toileting as needed    Swallow/Nutrition/ Hydration               ADL's   Min UB dressing, Mod-Max LB dressing, SBA UB bathing, Min LB bathing. LUE sling at all times although does use it to wash UB during bathing. No pain reported in LUE. Functional transfers Min-Mod A with posterior lean   Min-Mod A ADL   Continous family training with husband. ADL re-training, sit to stands, balance.    Mobility   minA bed mobility, transfers, ambulation with R HHA. ModA to maxA for high level ambulation and when pt fatigues, modA/maxA   CGA  family ed, DC prep    Communication                Safety/Cognition/ Behavioral Observations  Supevision-Min A   Supervision   functional problem solving, emergent awareness    Pain   No  c/o pain at this time   Pain <3/10   Assess qshift and prn    Skin   Hematoma to right forehead with steri strips.   Promote healing and prevention of skin breakdown  Assess Qshift and prn      Discharge Planning:  Pt will d/c to home with her husband who is the primary caregiver. SW will confirm there are no barriers to discharge.   Team Discussion: TBI. Behavior plan in place. Continent B/B with constipation. Headaches managed with PRN medications. Skin is intact. Area to forehead is healing.  Clear eyes added for eye dryness. Robitussin for cough. VS/Labs stable. Left shoulder immobilizer for Left clavicle fx. Daily weights. Husband/daughter okay to transfer in room. MinA UB dressing. Mod/MaxA LB dressing. MinA bed mobility, transfers, ambulation with R HHA. Mod/Max for high level gait. Therapy limited by fatigue and safety. Husband was caregiver PTA and was assisting with ADLs. Patient on target to meet rehab goals: yes, patient reaching goal levle  *See Care Plan and progress notes for long and short-term goals.   Revisions to Treatment Plan:  Medication adjustment, monitor weight, monitor labs  Teaching Needs: Medications, safety, skin care, gait/transfer training, self care, etc.  Current Barriers to Discharge: Decreased caregiver support, Wound care, and Behavior  Possible Resolutions to Barriers: Family education, nursing education, order recommended DME     Medical Summary Current Status: medically complicated  by headaches, constipation, eye dryness, cough/shortness of breath, CKD, TBI, afib, ABLA  and L clavicle fracture  Barriers to Discharge: Behavior/Mood;Cardiac Complications;Electrolyte abnormality;Medical stability;Renal Insufficiency/Failure;Self-care education;Spasticity;Weight bearing restrictions  Barriers to Discharge Comments: cognitive carryover for L clavicel restrictions, shortness of breath Possible Resolutions to [employer] Focus: medicaiton  adjustment and respiratory monitorring   Continued Need for Acute Rehabilitation Level of Care: The patient requires daily medical management by a physician with specialized training in physical medicine and rehabilitation for the following reasons: Direction of a multidisciplinary physical rehabilitation program to maximize functional independence : Yes Medical management of patient stability for increased activity during participation in an intensive rehabilitation regime.: Yes Analysis of laboratory values and/or radiology reports with any subsequent need for medication adjustment and/or medical intervention. : Yes   I attest that I was present, lead the team conference, and concur with the assessment and plan of the team.   Ernest Pine 03/15/2023, 1:00 PM

## 2023-03-15 NOTE — Progress Notes (Signed)
Inpatient Rehabilitation Care Coordinator Discharge Note   Patient Details  Name: [name] MRN: [id number] Date of Birth: 1941-09-23   Discharge location: D/c to home  Length of Stay: 8 days  Discharge activity level: Supervision  Home/community participation: Limited  Patient response XB:JYNWGN Literacy - How often do you need to have someone help you when you read instructions, pamphlets, or other written material from your doctor or pharmacy?: Never  Patient response FA:OZHYQM Isolation - How often do you feel lonely or isolated from those around you?: Never  Services provided included: MD, RD, PT, OT, SLP, RN, CM, TR, Pharmacy, Neuropsych, SW  Financial Services:  Field seismologist Utilized: Private Insurance [employer]  Choices offered to/list presented to: pt and patient husband  Follow-up services arranged:  Home Health Home Health Agency: [facility] for HHPT/OT/SLP         Patient response to transportation need: Is the patient able to respond to transportation needs?: Yes In the past 12 months, has lack of transportation kept you from medical appointments or from getting medications?: No In the past 12 months, has lack of transportation kept you from meetings, work, or from getting things needed for daily living?: No    Comments (or additional information):  Patient/Family verbalized understanding of follow-up arrangements:  Yes  Individual responsible for coordination of the follow-up plan: contact pt husband [name] [phone]  Confirmed correct DME delivered: [name] 03/15/2023    [name]

## 2023-03-15 NOTE — Discharge Instructions (Addendum)
Inpatient Rehab Discharge Instructions  [name] Discharge date and time: 03/16/2023  Activities/Precautions/ Functional Status: Activity: no lifting, driving, or strenuous exercise until cleared by MD Diet: regular diet Wound Care: keep wound clean and dry Functional status:  ___ No restrictions     ___ Walk up steps independently _x__ 24/7 supervision/assistance   ___ Walk up steps with assistance ___ Intermittent supervision/assistance  ___ Bathe/dress independently ___ Walk with walker     ___ Bathe/dress with assistance ___ Walk Independently    ___ Shower independently ___ Walk with assistance    _x__ Shower with assistance _x__ No alcohol     ___ Return to work/school ________  Special Instructions: No driving, alcohol consumption or tobacco use.  COMMUNITY REFERRALS UPON DISCHARGE:    Home Health:   PT     OT     ST                Agency: [facility] ([facility]     Phone: [phone] *Please expect follow-up within 2-3 days to schedule your home visit. If you have not received follow-up, be sure to contact the branch directly.*   My questions have been answered and I understand these instructions. I will adhere to these goals and the provided educational materials after my discharge from the hospital.  Patient/Caregiver Signature _______________________________ Date __________  Clinician Signature _______________________________________ Date __________  Please bring this form and your medication list with you to all your follow-up doctor's appointments.     ______________________________________________________________  Information on my medicine - Coumadin   (Warfarin)  This medication education was reviewed with me or my healthcare representative as part of my discharge preparation.    You were taking this medication prior to this hospital admission.    Why was Coumadin prescribed for you? Coumadin was prescribed  for you because you have a blood clot or a medical condition that can cause an increased risk of forming blood clots. Blood clots can cause serious health problems by blocking the flow of blood to the heart, lung, or brain. Coumadin can prevent harmful blood clots from forming. As a reminder your indication for Coumadin is:  Blood Clot Prevention after Heart Valve Surgery  (mechanical Aortic valve replacement and atrial fibrillation)   What test will check on my response to Coumadin? While on Coumadin (warfarin) you will need to have an INR test regularly to ensure that your dose is keeping you in the desired range. The INR (international normalized ratio) number is calculated from the result of the laboratory test called prothrombin time (PT).  If an INR APPOINTMENT HAS NOT ALREADY BEEN MADE FOR YOU please schedule an appointment to have this lab work done by your health care provider within 7 days. Your INR goal is usually a number between:  2 to 3 or your provider may give you a more narrow range like 2-2.5.  Ask your health care provider during an office visit what your goal INR is.  What  do you need to  know  About  COUMADIN? Take Coumadin (warfarin) exactly as prescribed by your healthcare provider about the same time each day.  DO NOT stop taking without talking to the doctor who prescribed the medication.  Stopping without other blood clot prevention medication to take the place of Coumadin may increase your risk of developing a new clot or stroke.  Get refills before you run out.  What do you do if you miss a dose?  If you miss a dose, take it as soon as you remember on the same day then continue your regularly scheduled regimen the next day.  Do not take two doses of Coumadin at the same time.  Important Safety Information A possible side effect of Coumadin (Warfarin) is an increased risk of bleeding. You should call your healthcare provider right away if you experience any of the  following: Bleeding from an injury or your nose that does not stop. Unusual colored urine (red or dark brown) or unusual colored stools (red or black). Unusual bruising for unknown reasons. A serious fall or if you hit your head (even if there is no bleeding).  Some foods or medicines interact with Coumadin (warfarin) and might alter your response to warfarin. To help avoid this: Eat a balanced diet, maintaining a consistent amount of Vitamin K. Notify your provider about major diet changes you plan to make. Avoid alcohol or limit your intake to 1 drink for women and 2 drinks for men per day. (1 drink is 5 oz. wine, 12 oz. beer, or 1.5 oz. liquor.)  Make sure that ANY health care provider who prescribes medication for you knows that you are taking Coumadin (warfarin).  Also make sure the healthcare provider who is monitoring your Coumadin knows when you have started a new medication including herbals and non-prescription products.  Coumadin (Warfarin)  Major Drug Interactions  Increased Warfarin Effect Decreased Warfarin Effect  Alcohol (large quantities) Antibiotics (esp. Septra/Bactrim, Flagyl, Cipro) Amiodarone (Cordarone) Aspirin (ASA) Cimetidine (Tagamet) Megestrol (Megace) NSAIDs (ibuprofen, naproxen, etc.) Piroxicam (Feldene) Propafenone (Rythmol SR) Propranolol (Inderal) Isoniazid (INH) Posaconazole (Noxafil) Barbiturates (Phenobarbital) Carbamazepine (Tegretol) Chlordiazepoxide (Librium) Cholestyramine (Questran) Griseofulvin Oral Contraceptives Rifampin Sucralfate (Carafate) Vitamin K   Coumadin (Warfarin) Major Herbal Interactions  Increased Warfarin Effect Decreased Warfarin Effect  Garlic Ginseng Ginkgo biloba Coenzyme Q10 Green tea St. John's wort    Coumadin (Warfarin) FOOD Interactions  Eat a consistent number of servings per week of foods HIGH in Vitamin K (1 serving =  cup)  Collards (cooked, or boiled & drained) Kale (cooked, or boiled &  drained) Mustard greens (cooked, or boiled & drained) Parsley *serving size only =  cup Spinach (cooked, or boiled & drained) Swiss chard (cooked, or boiled & drained) Turnip greens (cooked, or boiled & drained)  Eat a consistent number of servings per week of foods MEDIUM-HIGH in Vitamin K (1 serving = 1 cup)  Asparagus (cooked, or boiled & drained) Broccoli (cooked, boiled & drained, or raw & chopped) Brussel sprouts (cooked, or boiled & drained) *serving size only =  cup Lettuce, raw (green leaf, endive, romaine) Spinach, raw Turnip greens, raw & chopped   These websites have more information on Coumadin (warfarin):  FailFactory.se; VeganReport.com.au;

## 2023-03-15 NOTE — Progress Notes (Signed)
Physical Therapy Discharge Summary  Patient Details  Name: [name] MRN: [id number] Date of Birth: 06-11-1941  Date of Discharge from PT service:March 15, 2023  Today's Date: 03/15/2023 PT Individual Time: [id number] PT Individual Time Calculation (min): 72 min    Patient has met 5 of 9 long term goals due to improved activity tolerance, improved balance, improved postural control, improved attention, and improved awareness.  Patient to discharge at an ambulatory level [place].   Patient's care partner is independent to provide the necessary physical and cognitive assistance at discharge.  Reasons goals not met: Pt continues to require minA for ambulation, especially with fatigue. Pt's husband attended family education, however, and is able to provide necessary assistance for safe DC.   Recommendation:  Patient will benefit from ongoing skilled PT services in home health setting to continue to advance safe functional mobility, address ongoing impairments in strength, balance, ambulation, and minimize fall risk.  Equipment: No equipment provided  Reasons for discharge: discharge from hospital  Patient/family agrees with progress made and goals achieved: Yes  Skilled Therapeutic Interventions: Pt received seated in recliner and agrees to therapy. No complaint of pain. Stand step transfer to [facility] with CGA and cues for sequencing and positioning. WC transport to gym. Pt completes care transfer with CGA and ramp navigation with CGA R HHA and cues for step height and sequencing of turns. Additional cueing provided to improve safety of turn for transition to [facility]. Seated rest break. Pt ambulates x200' with R HHA and minA, with cues for utilization of forward and upright gaze to improve balance and safety. Pt takes seated rest break. Pt performs NMR for balance with stepping activity. Pt perform alternating forward steps, then returns to neutral stance with PT providing CGA/minA R HHA  for stability. Pt then performs side stepping alternating to R and L, then backward stepping, returning to neutral between each step. Following rest break, pt ambulates x175' with R HHA minA and cues for increasing R stride length to promote symmetrical reciprocal gait pattern. WC transport back to room. Pt left seated in recliner with all needs within reach.   PT Discharge Precautions/Restrictions Precautions Precautions: Fall Precaution Comments: pt is a high fall risk, left clavicle fx Required Braces or Orthoses: Sling Restrictions Weight Bearing Restrictions: Yes LUE Weight Bearing: Non weight bearing Other Position/Activity Restrictions: NWB in sling Pain Interference Pain Interference Pain Effect on Sleep: 1. Rarely or not at all Pain Interference with Therapy Activities: 1. Rarely or not at all Pain Interference with Day-to-Day Activities: 1. Rarely or not at all Vision/Perception  Vision - History Ability to See in Adequate Light: 0 Adequate Perception Perception: Within Functional Limits Praxis Praxis: Intact  Cognition Overall Cognitive Status: History of cognitive impairments - at baseline Arousal/Alertness: Awake/alert Orientation Level: Oriented X4 Behaviors: Impulsive Safety/Judgment: Impaired [employer] Scales of Cognitive Functioning: Automatic, Appropriate Sensation Sensation Light Touch: Appears Intact Coordination Gross Motor Movements are Fluid and Coordinated: Yes Fine Motor Movements are Fluid and Coordinated: Yes Motor  Motor Motor: Within Functional Limits  Mobility Bed Mobility Bed Mobility: Supine to Sit;Sit to Supine Supine to Sit: Supervision/Verbal cueing Sit to Supine: Supervision/Verbal cueing Transfers Transfers: Sit to Stand;Stand to Sit;Stand Pivot Transfers Sit to Stand: Contact Guard/Touching assist Stand to Sit: Contact Guard/Touching assist Stand Pivot Transfers: Contact Guard/Touching assist Stand Pivot Transfer Details:  Verbal cues for sequencing;Verbal cues for gait pattern;Verbal cues for technique;Tactile cues for posture;Tactile cues for sequencing Transfer (Assistive device): 1 person hand  held assist Locomotion  Gait Ambulation: Yes Gait Assistance: Minimal Assistance - Patient > 75% Gait Distance (Feet): 200 Feet Assistive device: 1 person hand held assist Gait Assistance Details: Tactile cues for initiation;Tactile cues for posture;Verbal cues for gait pattern;Verbal cues for sequencing Gait Gait: Yes Gait Pattern: Impaired Gait Pattern: Decreased stride length;Decreased step length - right;Trunk flexed Gait velocity: slow Stairs / Additional Locomotion Stairs: Yes Stairs Assistance: Moderate Assistance - Patient 50 - 74% Stair Management Technique: One rail Right Number of Stairs: 8 Height of Stairs: 6 Ramp: Contact Guard/touching assist Curb: Minimal Assistance - Patient >75% Wheelchair Mobility Wheelchair Mobility: No  Trunk/Postural Assessment  Cervical Assessment Cervical Assessment:  (forward head) Thoracic Assessment Thoracic Assessment:  (rounded shoulders) Lumbar Assessment Lumbar Assessment:  (posterior pelvic tilt) Postural Control Righting Reactions: delayed and inadequate Protective Responses: delayed and inadequate  Balance Balance Balance Assessed: Yes Static Sitting Balance Static Sitting - Balance Support: Feet supported Static Sitting - Level of Assistance: 5: Stand by assistance Dynamic Sitting Balance Dynamic Sitting - Balance Support: Feet supported Dynamic Sitting - Level of Assistance:  (CGA) Static Standing Balance Static Standing - Balance Support: During functional activity;Right upper extremity supported Static Standing - Level of Assistance:  (CGA) Dynamic Standing Balance Dynamic Standing - Balance Support: During functional activity;Right upper extremity supported Dynamic Standing - Level of Assistance: 4: Min assist Extremity Assessment  RLE  Assessment RLE Assessment: Exceptions to [facility] General Strength Comments: Grossly 4+/5, endruance impaired LLE Assessment LLE Assessment: Exceptions to [facility] General Strength Comments: Grossly 4+/5, endurance impaired    Breck Coons, PT, DPT 03/15/2023, 5:17 PM

## 2023-03-15 NOTE — Progress Notes (Addendum)
Patient ID: [name], female   DOB: 1941-03-11, 82 y.o.   MRN: [id number]  SW met with pt and pt husband in room to provide updates from team conference, and d/c date 3/20. NO HHA preference. SW will follow-up once in place.   SW sent HHPT/OT/SLP order to [facility] and waiting on follow-up. *referral accepted. SOC with PT tomorrow.   SW met with pt and pt husband in room to provide updates on above about HHA.   Loralee Pacas, MSW, [place] Office: [phone] Cell: [phone] Fax: [phone]

## 2023-03-15 NOTE — Progress Notes (Signed)
Occupational Therapy Session Note  Patient Details  Name: Azyriah Riach MRN: [id number] Date of Birth: 1941/03/27  Today's Date: 03/15/2023 OT Individual Time: 1500-1530 OT Individual Time Calculation (min): 30 min    Short Term Goals: Week 1:  OT Short Term Goal 1 (Week 1): Pt will transfer to toilet wiht MIN A OT Short Term Goal 2 (Week 1): Pt will don shirt wiht MOD A OT Short Term Goal 3 (Week 1): Pt will maintain dynamic standing balance with MIN A in prep for toileting OT Short Term Goal 4 (Week 1): pt will groom in standing to demo improved activity tolerance  Skilled Therapeutic Interventions/Progress Updates:   Pt up in recliner upon OT arrival for final session as pt will discharge CIR tomorrow. Husband [name] present bedside for session. OT worked initially with pt on RO and HPI for recall and rapport building. Pt requested toileting and OT had pt direct care to improve teach back of safety strategies. Needed min cues to maintain L UE NWB and direct OT for HHA with R UE. Pt required min A to amb to and from recliner to bathroom for toileting on regular seat with grab bar. Min A for pulling down and up clothing mngt. CGA for R hand peri hygiene. Stood sink side with CGA for hand washing. Addressed standing balance with R UE functional reach with bean bag target toss. R UE reach laterally and forward ~ 5" max with CGA for stability. Left pt recliner level with husband bedside, all safety measures in place and nurse call button in reach.   Pain: pt reported no pain.     03/15/23   Observation Details  Observation Environment pts room  Start of observation period - Date 03/15/23  Start of observation period - Time 1500  End of observation period - Date 03/15/23  End of observation period - Time 1530  Agitated Behavior Scale (DO NOT LEAVE BLANKS)  Short attention span, easy distractibility, inability to concentrate 1  Impulsive, impatient, low tolerance for pain or  frustration 1  Uncooperative, resistant to care, demanding 1  Violent and/or threatening violence toward people or property 1  Explosive and/or unpredictable anger 1  Rocking, rubbing, moaning, or other self-stimulating behavior 1  Pulling at tubes, restraints, etc. 1  Wandering from treatment areas 1  Restlessness, pacing, excessive movement 1  Repetitive behaviors, motor, and/or verbal 1  Rapid, loud, or excessive talking 1  Sudden changes of mood 1  Easily initiated or excessive crying and/or laughter 1  Self-abusiveness, physical and/or verbal 1  Agitated behavior scale total score 14        Therapy Documentation Precautions:  Precautions Precautions: Fall Precaution Comments: pt is a high fall risk, left clavicle fx Required Braces or Orthoses: Sling Restrictions Weight Bearing Restrictions: Yes LUE Weight Bearing: Non weight bearing Other Position/Activity Restrictions: NWB in sling    Therapy/Group: Individual Therapy  Barnabas Lister 03/15/2023, 7:51 AM

## 2023-03-15 NOTE — Progress Notes (Signed)
Physical Therapy TBI Note  Patient Details  Name: [name] MRN: [id number] Date of Birth: Nov 24, 1941  Today's Date: 03/15/2023 PT Individual Time: [id number] PT Individual Time Calculation (min): 45 min   Short Term Goals: Week 1:  PT Short Term Goal 1 (Week 1): Pt will complete bed mobility with minA consistently. PT Short Term Goal 2 (Week 1): Pt will perform bed to chair transfer minA PT Short Term Goal 3 (Week 1): Pt will ambulate x150' with minA consistently.  Skilled Therapeutic Interventions/Progress Updates:      Therapy Documentation Precautions:  Precautions Precautions: Fall Precaution Comments: pt is a high fall risk, left clavicle fx Required Braces or Orthoses: Sling Restrictions Weight Bearing Restrictions: Yes LUE Weight Bearing: Non weight bearing Other Position/Activity Restrictions: NWB in sling  Agitated Behavior Scale: TBI Observation Details Observation Environment: pt's room Start of observation period - Date: 03/15/23 Start of observation period - Time: 0830 End of observation period - Date: 03/15/23 End of observation period - Time: 0915 Agitated Behavior Scale (DO NOT LEAVE BLANKS) Short attention span, easy distractibility, inability to concentrate: Absent Impulsive, impatient, low tolerance for pain or frustration: Absent Uncooperative, resistant to care, demanding: Absent Violent and/or threatening violence toward people or property: Absent Explosive and/or unpredictable anger: Absent Rocking, rubbing, moaning, or other self-stimulating behavior: Absent Pulling at tubes, restraints, etc.: Absent Wandering from treatment areas: Absent Restlessness, pacing, excessive movement: Absent Repetitive behaviors, motor, and/or verbal: Absent Rapid, loud, or excessive talking: Absent Sudden changes of mood: Absent Easily initiated or excessive crying and/or laughter: Absent Self-abusiveness, physical and/or verbal: Absent Agitated  behavior scale total score: 14   Pt received seated in recliner with spouse at bedside, agreeable to additional PT session.   Pt without reports of pain in session and requires min A for sit to stand and gait >300 ft in session with hand held assist. Pt presents with narrowed base of support and decreased foot clearance and PT provided mod verbal cues to address impairments.   Pt participated in blocked practice of sit to stand transfers on inclined surface to facilitate anterior weight shift as pt with tendency to weight bear through her heels. PT provided mod verbal and visual cues for anterior trunk flexion to encourage increased anterior weight shift with sit to stand.   Pt presents with poor eccentric control with sit to stand and performed stand to sit transfers with emphasis on slowly lowering to increase safety with transfers. Pt also performed mini squats 1 x 10 to address LE strength deficits.   Pt ambulated to room with min hand held assist without an assistive device with improved carryover from initial gait training session.   Pt left seated in recliner at bedside with spouse present and all needs within reach.   Therapy/Group: Individual Therapy  Verl Dicker Verl Dicker PT, DPT  03/15/2023, 12:29 PM

## 2023-03-15 NOTE — Progress Notes (Signed)
Occupational Therapy Discharge Summary  Patient Details  Name: [name] MRN: [id number] Date of Birth: Oct 13, 1941  Date of Discharge from [facility] service:March 15, 2023  Patient has met 12 of 12 long term goals due to improved activity tolerance, improved balance, postural control, ability to compensate for deficits, improved attention, and improved awareness.  Patient to discharge at [facility] Assist level.  Patient's care partner is independent to provide the necessary physical and cognitive assistance at discharge.  [name] and her husband have practiced well and demonstrated good carryover of strategies for safe mobility prior to DC home. [name], her husband, has been providing care previous to this fall and feels comfortable giving min-mod A for ADLs and functional mobility.  Reasons goals not met: n/a  Recommendation:  Patient will benefit from ongoing skilled OT services in home health setting to continue to advance functional skills in the area of BADL and Reduce care partner burden.  Equipment: No equipment provided  Reasons for discharge: treatment goals met and discharge from hospital  Patient/family agrees with progress made and goals achieved: Yes  OT Discharge Precautions/Restrictions  \LUE NWB in sling ADL ADL Eating: Modified independent Where Assessed-Eating: Bed level Grooming: Modified independent Where Assessed-Grooming: Sitting at sink Upper Body Bathing: Supervision/safety Where Assessed-Upper Body Bathing: Shower Lower Body Bathing: Minimal assistance Where Assessed-Lower Body Bathing: Shower Upper Body Dressing: Minimal assistance Where Assessed-Upper Body Dressing: Edge of bed Lower Body Dressing: Moderate assistance Where Assessed-Lower Body Dressing: Edge of bed Toileting: Moderate assistance Where Assessed-Toileting: Glass blower/designer: Therapist, music Method: Psychologist, educational: Neurosurgeon Method: Stand pivot Vision Baseline Vision/History: 1 Wears glasses Patient Visual Report: No change from baseline Vision Assessment?: No apparent visual deficits Perception  Perception: Within Functional Limits Praxis Praxis: Intact Cognition Cognition Overall Cognitive Status: History of cognitive impairments - at baseline Arousal/Alertness: Awake/alert Behaviors: Impulsive Safety/Judgment: Impaired [employer] Scales of Cognitive Functioning: Automatic, Appropriate Sensation Sensation Light Touch: Appears Intact Coordination Gross Motor Movements are Fluid and Coordinated: Yes Fine Motor Movements are Fluid and Coordinated: Yes Motor  Motor Motor: Within Functional Limits Mobility  Bed Mobility Bed Mobility: Supine to Sit;Sit to Supine Supine to Sit: Supervision/Verbal cueing Sit to Supine: Supervision/Verbal cueing Transfers Sit to Stand: Contact Guard/Touching assist Stand to Sit: Contact Guard/Touching assist  Trunk/Postural Assessment  Cervical Assessment Cervical Assessment:  (forward head) Thoracic Assessment Thoracic Assessment:  (rounded shoulders) Lumbar Assessment Lumbar Assessment:  (posterior pelvic tilt) Postural Control Righting Reactions: delayed and inadequate Protective Responses: delayed and inadequate  Balance Balance Balance Assessed: Yes Static Sitting Balance Static Sitting - Balance Support: Feet supported Static Sitting - Level of Assistance: 5: Stand by assistance Dynamic Sitting Balance Dynamic Sitting - Balance Support: Feet supported Dynamic Sitting - Level of Assistance:  (CGA) Static Standing Balance Static Standing - Balance Support: During functional activity;Right upper extremity supported Static Standing - Level of Assistance:  (CGA) Dynamic Standing Balance Dynamic Standing - Balance Support: During functional activity;Right upper extremity supported Dynamic Standing - Level of Assistance: 4: Min  assist Extremity/Trunk Assessment RUE Assessment RUE Assessment: Within Functional Limits LUE Assessment LUE Assessment: Exceptions to [facility] General Strength Comments: clavicle fx, reaches functionally despite cuing to limit movement d/t fracture no pain reported   Tonny Branch 03/15/2023, 5:46 PM

## 2023-03-15 NOTE — Progress Notes (Signed)
Occupational Therapy Session TBI Note  Patient Details  Name: [name] MRN: [id number] Date of Birth: Jan 21, 1941  Today's Date: 03/15/2023 OT Individual Time: [id number] OT Individual Time Calculation (min): 73 min   Today's Date: 03/15/2023 OT Individual Time: 1032-1100 OT Individual Time Calculation (min): 28 min  Short Term Goals: Week 1:  OT Short Term Goal 1 (Week 1): Pt will transfer to toilet wiht MIN A OT Short Term Goal 2 (Week 1): Pt will don shirt wiht MOD A OT Short Term Goal 3 (Week 1): Pt will maintain dynamic standing balance with MIN A in prep for toileting OT Short Term Goal 4 (Week 1): pt will groom in standing to demo improved activity tolerance  Skilled Therapeutic Interventions/Progress Updates:     Pt received in bathroom with no pain and NT + daughter present. Therapeutic  activity Pt + daughter participate in hands on transfer trainging to check off daughter to assist pt to walk into andout of the bathroom. Pt daughter given feedback on improving cuing strategies and functional communication if she does not feel like pt is steady on feet or she needs to adjust her hand hold on gait belt. Better with practice  Husband arrives and prolonged conversation about DC planning, CLOF, PLOF and WB restrcitions/implicaitons. Pt and husband practice simulated shower transfers 2x with CGA for first trial from OT d/t pt posterior weight shift but then not needed during second trial. Husband draws simulation of shower to allow better understanding of set up. Recommend lateral stepping out of shower to decrease posterior bias so grab bar will be in front of pt. Recommend dry run without water before actually showering.  Pt left at end of session in bed with exit alarm on, call light in reach and all needs met   Session 2: pt received and remained in recliner druing session. Pt husband present asking about education on bed rail options. Demoed a couple for him till he  found exactly what he was looking for, extendable and movable to guard pt from getting OOB without him. Pt agrees this will be a helpful reminder for night time urination. Exited session with pt seated in recliner,  and call light in reach    Therapy Documentation Precautions:  Precautions Precautions: Fall Precaution Comments: pt is a high fall risk, left clavicle fx Required Braces or Orthoses: Sling Restrictions Weight Bearing Restrictions: Yes LUE Weight Bearing: Non weight bearing Other Position/Activity Restrictions: NWB in sling General:   ABS discontinued d/t ABS score less than 20 for the last three days or no behaviors present  Therapy/Group: Individual Therapy  Tonny Branch 03/15/2023, 6:42 AM

## 2023-03-15 NOTE — Progress Notes (Signed)
[place] for warfarin  Indication: atrial fibrillation and mechanical aortic valve   Allergies  Allergen Reactions   Demerol [Meperidine] Shortness Of Breath and Swelling   Oxycontin [Oxycodone] Other (See Comments)    Hallucinations    Adhesive [Tape] Other (See Comments)    Redness and skin tears   Cozaar [Losartan] Other (See Comments)    Dizziness    Fosamax [Alendronate] Other (See Comments)    Heartburn    Ivp Dye [Iodinated Contrast Media] Hives    OK with benadryl pre-med (50mg  one hour before receiving iodinated contrast agent)   Phenergan [Promethazine] Other (See Comments)    Avoids due to reaction with current medications   Patient Measurements: Height: 5' (152.4 cm) Weight: 50.9 kg (112 lb 3.2 oz) IBW/kg (Calculated) : 45.5  Vital Signs: Temp: 98.1 F (36.7 C) (03/19 1243) BP: 114/57 (03/19 1243) Pulse Rate: 70 (03/19 1243)  Labs: Recent Labs    03/13/23 0549 03/14/23 0725 03/15/23 0536  HGB  --  8.1* 8.2*  HCT  --  24.0* 24.8*  PLT  --  256 268  LABPROT 25.1* 26.1* 26.2*  INR 2.3* 2.4* 2.4*  CREATININE  --  1.67*  --     Estimated Creatinine Clearance: 18.7 mL/min (A) (by C-G formula based on SCr of 1.67 mg/dL (H)).  Assessment: 79 YOF admitted with SDH.  Patient was on Coumadin 2.5mg  all days except 5mg  on Thursdays PTA for history of Afib and mechanical AVR.  Neurosurgery okay with resuming anticoagulation > warfarin restarted 3/01.   3/09 CT negative for new intracranial hemorrhage, increased hematoma along R frontal scalp. OK to continue Coumadin per discussion with MD.  INR remains at 2.4 today,  subtherapeutic , just under goal 2.5-3.5.  Hbg 8.2 stable, platelets are WNL. No signs or symptoms of bleeding reported.  Goal of Therapy:  INR 2.5-3.5 (confirmed w/ husband at bedside)    Plan:  Give warfarin  5 mg x1 today Monitor daily INR, weekly CBC Monitor signs and symptoms of bleeding  Thank you  for allowing pharmacy to be part of this patients care team.  Nicole Cella, RPh Clinical Pharmacist 03/15/2023 2:21 PM Please check AMION for all [facility] phone numbers After 10:00 PM, call [place]

## 2023-03-16 ENCOUNTER — Other Ambulatory Visit (HOSPITAL_COMMUNITY): Payer: Self-pay

## 2023-03-16 ENCOUNTER — Telehealth: Payer: Self-pay

## 2023-03-16 DIAGNOSIS — S069XAS Unspecified intracranial injury with loss of consciousness status unknown, sequela: Secondary | ICD-10-CM

## 2023-03-16 LAB — CBC
HCT: 23.9 % — ABNORMAL LOW (ref 36.0–46.0)
Hemoglobin: 7.8 g/dL — ABNORMAL LOW (ref 12.0–15.0)
MCH: 33.1 pg (ref 26.0–34.0)
MCHC: 32.6 g/dL (ref 30.0–36.0)
MCV: 101.3 fL — ABNORMAL HIGH (ref 80.0–100.0)
Platelets: 271 10*3/uL (ref 150–400)
RBC: 2.36 MIL/uL — ABNORMAL LOW (ref 3.87–5.11)
RDW: 13.5 % (ref 11.5–15.5)
WBC: 7.3 10*3/uL (ref 4.0–10.5)
nRBC: 0 % (ref 0.0–0.2)

## 2023-03-16 LAB — PROTIME-INR
INR: 2.5 — ABNORMAL HIGH (ref 0.8–1.2)
Prothrombin Time: 26.9 seconds — ABNORMAL HIGH (ref 11.4–15.2)

## 2023-03-16 MED ORDER — FUROSEMIDE 20 MG PO TABS
20.0000 mg | ORAL_TABLET | Freq: Every day | ORAL | 0 refills | Status: DC
  Filled 2023-03-16: qty 30, 30d supply, fill #0

## 2023-03-16 MED ORDER — WARFARIN SODIUM 2.5 MG PO TABS: 5.0000 mg | ORAL_TABLET | Freq: Once | ORAL | Status: DC

## 2023-03-16 MED ORDER — TAMSULOSIN HCL 0.4 MG PO CAPS
0.4000 mg | ORAL_CAPSULE | Freq: Every day | ORAL | 0 refills | Status: DC
  Filled 2023-03-16: qty 30, 30d supply, fill #0

## 2023-03-16 MED ORDER — NAPHAZOLINE-GLYCERIN 0.012-0.25 % OP SOLN: 1.0000 [drp] | Freq: Four times a day (QID) | OPHTHALMIC | Status: AC | PRN

## 2023-03-16 MED ORDER — TRAMADOL HCL 50 MG PO TABS
50.0000 mg | ORAL_TABLET | Freq: Four times a day (QID) | ORAL | 0 refills | Status: DC | PRN
  Filled 2023-03-16: qty 15, 4d supply, fill #0

## 2023-03-16 MED ORDER — METHOCARBAMOL 500 MG PO TABS
500.0000 mg | ORAL_TABLET | Freq: Three times a day (TID) | ORAL | 0 refills | Status: DC
  Filled 2023-03-16: qty 90, 30d supply, fill #0

## 2023-03-16 MED ORDER — CARBIDOPA-LEVODOPA 25-250 MG PO TABS
ORAL_TABLET | ORAL | 0 refills | Status: DC
  Filled 2023-03-16: qty 66, 14d supply, fill #0

## 2023-03-16 MED ORDER — FERROUS SULFATE 325 (65 FE) MG PO TABS
325.0000 mg | ORAL_TABLET | ORAL | 0 refills | Status: DC
  Filled 2023-03-16: qty 15, 30d supply, fill #0

## 2023-03-16 MED ORDER — ACETAMINOPHEN 325 MG PO TABS: 325.0000 mg | ORAL_TABLET | ORAL | Status: AC | PRN

## 2023-03-16 MED ORDER — ALUM & MAG HYDROXIDE-SIMETH 400-400-40 MG/5ML PO SUSP
15.0000 mL | Freq: Three times a day (TID) | ORAL | 0 refills | Status: DC
  Filled 2023-03-16: qty 355, 8d supply, fill #0

## 2023-03-16 MED ORDER — WARFARIN SODIUM 5 MG PO TABS: ORAL_TABLET | ORAL | Status: AC

## 2023-03-16 NOTE — Progress Notes (Signed)
Inpatient Rehabilitation Discharge Medication Review by a Pharmacist    A complete drug regimen review was completed for this patient to identify any potential clinically significant medication issues.   High Risk Drug Classes Is patient taking? Indication by Medication  Antipsychotic No    Anticoagulant Yes Warfarin - mechanical AVR  Antibiotic No    Opioid Yes Tramadol - PRN pain  Antiplatelet No    Hypoglycemics/insulin No    Vasoactive Medication No    Chemotherapy No    Other Yes Sinemet - Progressive supranuclear palsy (PSP) Pantoprazole - GERD ppx Ferrous sulfate -iron supplement Methocarbamol - muscle spasms Melatonin - PRN sleep Simvastatin - HLD Tamsulosin - urinary retention        Type of Medication Issue Identified Description of Issue Recommendation(s)  Drug Interaction(s) (clinically significant)        Duplicate Therapy        Allergy        No Medication Administration End Date        Incorrect Dose        Additional Drug Therapy Needed        Significant med changes from prior encounter (inform family/care partners about these prior to discharge). Amiodarone, metoprolol, and Kcl have been discontinued on discharge from Inpatient Rehab.  Communicate medication changes with patient/family at discharge.  Discharge AVS clearly states to stop taking these medications.  Other            Clinically significant medication issues were identified that warrant physician communication and completion of prescribed/recommended actions by midnight of the next day:  No     Pharmacist comments: n/a     Time spent performing this drug regimen review (minutes): 60     Thank you for allowing pharmacy to be part of this patients care team.  Nicole Cella, RPh Clinical Pharmacist 03/16/2023 3:46 PM
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
## 2023-03-16 NOTE — Progress Notes (Addendum)
[facility] for warfarin  Indication: atrial fibrillation and mechanical aortic valve   Allergies  Allergen Reactions   Demerol [Meperidine] Shortness Of Breath and Swelling   Oxycontin [Oxycodone] Other (See Comments)    Hallucinations    Adhesive [Tape] Other (See Comments)    Redness and skin tears   Cozaar [Losartan] Other (See Comments)    Dizziness    Fosamax [Alendronate] Other (See Comments)    Heartburn    Ivp Dye [Iodinated Contrast Media] Hives    OK with benadryl pre-med (50mg  one hour before receiving iodinated contrast agent)   Phenergan [Promethazine] Other (See Comments)    Avoids due to reaction with current medications   Patient Measurements: Height: 5' (152.4 cm) Weight: 51 kg (112 lb 7 oz) IBW/kg (Calculated) : 45.5  Vital Signs: Temp: 98.5 F (36.9 C) (03/20 0420) Temp Source: Oral (03/20 0420) BP: 131/69 (03/20 0420) Pulse Rate: 67 (03/20 0420)  Labs: Recent Labs    03/14/23 0725 03/15/23 0536 03/16/23 0537  HGB 8.1* 8.2* 7.8*  HCT 24.0* 24.8* 23.9*  PLT 256 268 271  LABPROT 26.1* 26.2* 26.9*  INR 2.4* 2.4* 2.5*  CREATININE 1.67*  --   --     Estimated Creatinine Clearance: 18.7 mL/min (A) (by C-G formula based on SCr of 1.67 mg/dL (H)).  Assessment: [name] admitted with SDH.  Patient was on Coumadin 2.5mg  all days except 5mg  on Thursdays PTA for history of Afib and mechanical AVR.  Neurosurgery okay with resuming anticoagulation > warfarin restarted 3/01.   3/09 CT negative for new intracranial hemorrhage, increased hematoma along R frontal scalp. OK to continue Coumadin per discussion with MD.  INR remains at 2.5, now therapeutic at goal 2.5-3.5.  Hbg had been stable in 8s range has dropped to 7.8.  platelets remain  WNL. No signs or symptoms of bleeding reported.  Amiodarone, a PTA medication has been on hold since acute care admit 2/29 due to bradycardia> likely why pt needing higher warfarin dosing  than PTA when on both meds as amio increases warf effect.   Risa Grill, PA confirms amiodarone will not be resume on discharge>discontinued.   Goal of Therapy:  INR 2.5-3.5 (confirmed w/ husband at bedside)    Plan:  Give warfarin  5 mg x1 today,  will try Warfarin 5mg  every MWFSun, and 2.5mg  every TTSat. Daily INR, weekly CBC Monitor signs and symptoms of bleeding  If patient discharged today 03/16/23 , I recommend discharge on Warfarin  5mg   every MWFSun (4days/week) and 2.5mg  qTTSat (3days/week) Recommend to check outpatient INR check early next week by Monday or Tuesday.  Thank you for allowing pharmacy to be part of this patients care team.  Nicole Cella, RPh Clinical Pharmacist 03/16/2023 9:20 AM Please check AMION for all [place] phone numbers After 10:00 PM, call [place] [phone]

## 2023-03-16 NOTE — Telephone Encounter (Signed)
Called Rehab facility and spoke with discharge nurse who wanted to make you aware that the hospital at time of patient falling and becoming injured with a head wound changed her meds to 2 po 25/250 mg carbidopa levodopa TID. Discharge nurse caught this and tried to find out why this was changes she could get no clear answer and safety zone portal has been entered for the change in meds for this patient. Pateitn has been on this higher dosage for three weeks and at the time of discharge they didn't want ot change it back without consulting Dr. Carles Collet and her opinion on what the patient needs to be on at this time

## 2023-03-16 NOTE — Telephone Encounter (Signed)
Inpatient Rehab called to access nurse while we were on lunch, requesting to speak with patients doctor about her medications, call back number [phone].

## 2023-03-16 NOTE — Progress Notes (Signed)
Speech Language Pathology Discharge Summary  Patient Details  Name: Ellene Marko MRN: [id number] Date of Birth: Jan 11, 1941  Date of Discharge from [facility] service:March 15, 2023  Patient has met 2 of 2 long term goals.  Patient to discharge at overall Supervision;Min level.   Reasons goals not met: N/A   Clinical Impression/Discharge Summary: Patient has made functional gains and has met 2 of 2 LTGs this admission. Currently, patient demonstrates behaviors consistent with a Rancho Level VII and requires overall supervision-Min A verbal cues to complete functional and familiar tasks safely in regards to problem solving and awareness. Patient and family education is complete and patient will discharge home with 24 hour supervision. Patient would benefit from f/u SLP services to maximize her cognitive functioning and overall functional independence in order to reduce caregiver burden.   Care Partner:  Caregiver Able to Provide Assistance: Yes  Type of Caregiver Assistance: Physical;Cognitive  Recommendation:  24 hour supervision/assistance;Home Health SLP  Rationale for SLP Follow Up: Maximize cognitive function and independence;Reduce caregiver burden   Equipment: N/A   Reasons for discharge: Discharged from hospital;Treatment goals met   Patient/Family Agrees with Progress Made and Goals Achieved: Yes    Gunnard Dorrance, Cecilia 03/16/2023, 7:01 AM

## 2023-03-17 ENCOUNTER — Telehealth: Payer: Self-pay

## 2023-03-17 ENCOUNTER — Other Ambulatory Visit: Payer: Self-pay

## 2023-03-17 MED ORDER — CARBIDOPA-LEVODOPA 25-100 MG PO TABS: 2.0000 | ORAL_TABLET | Freq: Three times a day (TID) | ORAL | 0 refills | Status: DC

## 2023-03-17 NOTE — Telephone Encounter (Signed)
Pt return call °

## 2023-03-17 NOTE — Telephone Encounter (Signed)
Transitional Care Call--who you spoke with   Are you/is patient experiencing any problems since coming home? No. Are there any questions regarding any aspect of care? No.  Are there any questions regarding medications administration/dosing? No. There was a question about the Rx Sinemet. On yesterday Mr. Thornes (Spouse) called  Dr. Carles Collet (Neurology) to confirm dosing. Are meds being taken as prescribed? Yes. Patient should review meds with caller to confirm.Done. Have there been any falls? None Has Home Health been to the house and/or have they contacted you? Yes. They are scheduled to come tomorrow. If not, have you tried to contact them? N/A.  Can we help you contact them? [place] to  supply needs.  Are bowels and bladder emptying properly? Yes. Are there any unexpected incontinence issues? No. If applicable, is patient following bowel/bladder programs? N/A Any fevers, problems with breathing, unexpected pain?  No. Taking Tylenol 500 MG for PRN for pain and rare Tramadol.  Are there any skin problems or new areas of breakdown? No.  Has the patient/family member arranged specialty MD follow up (ie cardiology/neurology/renal/surgical/etc)?  Yes.  Can we help arrange? No.  Does the patient need any other services or support that we can help arrange? No. Spouse advised to call Dr. Tressa Busman if there are any issues or needs.   Are caregivers following through as expected in assisting the patient? No        11. Has the patient quit smoking, drinking alcohol, or using drugs as recommended? Mr. Mancilla stated she does not take part in such activities.   Appointment with Danella Sensing NP 03/30/2023 with a 10:00 arrival.

## 2023-03-17 NOTE — Telephone Encounter (Signed)
Called patients husband back and patient is back on the carbidopa levodopa 25/100. Patients husband had begun the weaning process of removing one pill a week. Patients husband will begin doing that slowly with the 25/100 again and I have asked him to call with any issues

## 2023-03-17 NOTE — Telephone Encounter (Signed)
[name] was called today for a hospital  /  TC follow up for his wife? She is doing well since being home. The only question is the Tylenol.  At home she has Tylenol 500 MG not the 325 MG, for PRN a pain. She is also  rarely taking Tramadol.  Will the Tylenol 500 MG be okay?  Call back phone [phone].

## 2023-03-17 NOTE — Telephone Encounter (Signed)
Called patients husband and left message to please give me a call to go over Dr. Arturo Morton recommendations on patients meds

## 2023-03-18 DIAGNOSIS — I130 Hypertensive heart and chronic kidney disease with heart failure and stage 1 through stage 4 chronic kidney disease, or unspecified chronic kidney disease: Secondary | ICD-10-CM | POA: Diagnosis not present

## 2023-03-18 DIAGNOSIS — Z954 Presence of other heart-valve replacement: Secondary | ICD-10-CM | POA: Diagnosis not present

## 2023-03-18 DIAGNOSIS — G20C Parkinsonism, unspecified: Secondary | ICD-10-CM | POA: Diagnosis not present

## 2023-03-18 DIAGNOSIS — G231 Progressive supranuclear ophthalmoplegia [Steele-Richardson-Olszewski]: Secondary | ICD-10-CM | POA: Diagnosis not present

## 2023-03-18 DIAGNOSIS — I5022 Chronic systolic (congestive) heart failure: Secondary | ICD-10-CM | POA: Diagnosis not present

## 2023-03-18 DIAGNOSIS — S42002D Fracture of unspecified part of left clavicle, subsequent encounter for fracture with routine healing: Secondary | ICD-10-CM | POA: Diagnosis not present

## 2023-03-18 DIAGNOSIS — I493 Ventricular premature depolarization: Secondary | ICD-10-CM | POA: Diagnosis not present

## 2023-03-18 DIAGNOSIS — S42032D Displaced fracture of lateral end of left clavicle, subsequent encounter for fracture with routine healing: Secondary | ICD-10-CM | POA: Diagnosis not present

## 2023-03-18 DIAGNOSIS — Z9181 History of falling: Secondary | ICD-10-CM | POA: Diagnosis not present

## 2023-03-18 DIAGNOSIS — I480 Paroxysmal atrial fibrillation: Secondary | ICD-10-CM | POA: Diagnosis not present

## 2023-03-18 DIAGNOSIS — I5042 Chronic combined systolic (congestive) and diastolic (congestive) heart failure: Secondary | ICD-10-CM | POA: Diagnosis not present

## 2023-03-18 DIAGNOSIS — Z952 Presence of prosthetic heart valve: Secondary | ICD-10-CM | POA: Diagnosis not present

## 2023-03-18 DIAGNOSIS — Z7901 Long term (current) use of anticoagulants: Secondary | ICD-10-CM | POA: Diagnosis not present

## 2023-03-18 DIAGNOSIS — D631 Anemia in chronic kidney disease: Secondary | ICD-10-CM | POA: Diagnosis not present

## 2023-03-18 DIAGNOSIS — I110 Hypertensive heart disease with heart failure: Secondary | ICD-10-CM | POA: Diagnosis not present

## 2023-03-18 DIAGNOSIS — S065XAA Traumatic subdural hemorrhage with loss of consciousness status unknown, initial encounter: Secondary | ICD-10-CM | POA: Diagnosis not present

## 2023-03-18 DIAGNOSIS — S065XAD Traumatic subdural hemorrhage with loss of consciousness status unknown, subsequent encounter: Secondary | ICD-10-CM | POA: Diagnosis not present

## 2023-03-18 DIAGNOSIS — N182 Chronic kidney disease, stage 2 (mild): Secondary | ICD-10-CM | POA: Diagnosis not present

## 2023-03-18 DIAGNOSIS — D62 Acute posthemorrhagic anemia: Secondary | ICD-10-CM | POA: Diagnosis not present

## 2023-03-18 DIAGNOSIS — I4892 Unspecified atrial flutter: Secondary | ICD-10-CM | POA: Diagnosis not present

## 2023-03-18 DIAGNOSIS — R001 Bradycardia, unspecified: Secondary | ICD-10-CM | POA: Diagnosis not present

## 2023-03-18 DIAGNOSIS — N183 Chronic kidney disease, stage 3 unspecified: Secondary | ICD-10-CM | POA: Diagnosis not present

## 2023-03-21 DIAGNOSIS — S065X0A Traumatic subdural hemorrhage without loss of consciousness, initial encounter: Secondary | ICD-10-CM | POA: Diagnosis not present

## 2023-03-21 DIAGNOSIS — R269 Unspecified abnormalities of gait and mobility: Secondary | ICD-10-CM | POA: Diagnosis not present

## 2023-03-21 DIAGNOSIS — I129 Hypertensive chronic kidney disease with stage 1 through stage 4 chronic kidney disease, or unspecified chronic kidney disease: Secondary | ICD-10-CM | POA: Diagnosis not present

## 2023-03-21 DIAGNOSIS — I480 Paroxysmal atrial fibrillation: Secondary | ICD-10-CM | POA: Diagnosis not present

## 2023-03-21 DIAGNOSIS — E871 Hypo-osmolality and hyponatremia: Secondary | ICD-10-CM | POA: Diagnosis not present

## 2023-03-21 DIAGNOSIS — Z7901 Long term (current) use of anticoagulants: Secondary | ICD-10-CM | POA: Diagnosis not present

## 2023-03-21 DIAGNOSIS — R41843 Psychomotor deficit: Secondary | ICD-10-CM | POA: Diagnosis not present

## 2023-03-21 DIAGNOSIS — Z952 Presence of prosthetic heart valve: Secondary | ICD-10-CM | POA: Diagnosis not present

## 2023-03-21 DIAGNOSIS — N184 Chronic kidney disease, stage 4 (severe): Secondary | ICD-10-CM | POA: Diagnosis not present

## 2023-03-21 DIAGNOSIS — R4189 Other symptoms and signs involving cognitive functions and awareness: Secondary | ICD-10-CM | POA: Diagnosis not present

## 2023-03-21 DIAGNOSIS — S42009D Fracture of unspecified part of unspecified clavicle, subsequent encounter for fracture with routine healing: Secondary | ICD-10-CM | POA: Diagnosis not present

## 2023-03-21 DIAGNOSIS — D649 Anemia, unspecified: Secondary | ICD-10-CM | POA: Diagnosis not present

## 2023-03-22 NOTE — Telephone Encounter (Signed)
[name] has been informed. Task completed.

## 2023-03-23 ENCOUNTER — Other Ambulatory Visit (HOSPITAL_COMMUNITY): Payer: Self-pay | Admitting: Neurological Surgery

## 2023-03-23 DIAGNOSIS — S42032D Displaced fracture of lateral end of left clavicle, subsequent encounter for fracture with routine healing: Secondary | ICD-10-CM | POA: Diagnosis not present

## 2023-03-23 DIAGNOSIS — S06360A Traumatic hemorrhage of cerebrum, unspecified, without loss of consciousness, initial encounter: Secondary | ICD-10-CM

## 2023-03-24 ENCOUNTER — Encounter: Payer: Self-pay | Admitting: Internal Medicine

## 2023-03-24 ENCOUNTER — Ambulatory Visit: Payer: Medicare HMO | Attending: Internal Medicine | Admitting: Internal Medicine

## 2023-03-24 VITALS — BP 108/62 | HR 67 | Ht 60.0 in | Wt 114.0 lb

## 2023-03-24 DIAGNOSIS — I10 Essential (primary) hypertension: Secondary | ICD-10-CM

## 2023-03-24 DIAGNOSIS — I480 Paroxysmal atrial fibrillation: Secondary | ICD-10-CM

## 2023-03-24 DIAGNOSIS — I4892 Unspecified atrial flutter: Secondary | ICD-10-CM

## 2023-03-24 NOTE — Patient Instructions (Addendum)
Medication Instructions:  Your physician recommends that you continue on your current medications as directed. Please refer to the Current Medication list given to you today.  *If you need a refill on your cardiac medications before your next appointment, please call your pharmacy*  Lab Work: None ordered.  If you have labs (blood work) drawn today and your tests are completely normal, you will receive your results only by: [place] (if you have MyChart) OR A paper copy in the mail If you have any lab test that is abnormal or we need to change your treatment, we will call you to review the results.  Testing/Procedures: None ordered.  Follow-Up: At [facility], you and your health needs are our priority.  As part of our continuing mission to provide you with exceptional heart care, we have created designated Provider Care Teams.  These Care Teams include your primary Cardiologist (physician) and Advanced Practice Providers (APPs -  Physician Assistants and Nurse Practitioners) who all work together to provide you with the care you need, when you need it.  Your next appointment:   Please schedule a 6 month follow up appointment with Dr. Lovena Le.    The format for your next appointment:   In Person  Provider:   Cristopher Peru, MD{or one of the following Advanced Practice Providers on your designated Care Team:   Tommye Standard, [place] Legrand Como "[facility]" [place], [place]

## 2023-03-24 NOTE — Progress Notes (Signed)
HPI [name] returns today for followup of her PAF, LBBB, s/p AVR, with a h/o LBBB, Parkinsons like syndrome/PSP, and HTN. In the interim, she appears to be maintaining NSR. She denies chest pain or sob. She had fallen and was in the hospital. She tried to get out of her car without help. She bled in her brain. Her amio was stopped. She denies symptoms of recurrent atrial fib. Allergies  Allergen Reactions   Demerol [Meperidine] Shortness Of Breath and Swelling   Oxycontin [Oxycodone] Other (See Comments)    Hallucinations    Adhesive [Tape] Other (See Comments)    Redness and skin tears   Cozaar [Losartan] Other (See Comments)    Dizziness    Fosamax [Alendronate] Other (See Comments)    Heartburn    Ivp Dye [Iodinated Contrast Media] Hives    OK with benadryl pre-med (50mg  one hour before receiving iodinated contrast agent)   Phenergan [Promethazine] Other (See Comments)    Avoids due to reaction with current medications     Current Outpatient Medications  Medication Sig Dispense Refill   acetaminophen (TYLENOL) 325 MG tablet Take 1-2 tablets (325-650 mg total) by mouth every 4 (four) hours as needed for mild pain. (Patient taking differently: Take 500 mg by mouth every 4 (four) hours as needed for mild pain.)     alum & mag hydroxide-simeth (MAALOX PLUS) 400-400-40 MG/5ML suspension Take 15 mLs by mouth 3 (three) times daily after meals. 355 mL 0   CALCIUM CITRATE PO Take 1 tablet by mouth 3 (three) times daily.     carbidopa-levodopa (SINEMET IR) 25-100 MG tablet Take 2 tablets by mouth 3 (three) times daily. (Patient taking differently: Take 2 tablets by mouth. 2 tabs in the am, noon, 1 tab pm) 540 tablet 0   Cholecalciferol (VITAMIN D3) 2000 units TABS Take 2,000 Units by mouth daily.      famotidine-calcium carbonate-magnesium hydroxide (PEPCID COMPLETE) 10-800-165 MG CHEW chewable tablet Chew 1 tablet by mouth daily as needed (heartburn).     ferrous sulfate 325 (65  FE) MG tablet Take 1 tablet (325 mg total) by mouth every other day. (Patient taking differently: Take 325 mg by mouth. 3 days per week) 15 tablet 0   fexofenadine (ALLEGRA) 60 MG tablet Take 60 mg by mouth at bedtime.     Fish Oil-Krill Oil (SCHIFF KRILL & FISH OIL BLEND) CAPS Take 1 capsule by mouth daily at 12 noon.     furosemide (LASIX) 20 MG tablet Take 1 tablet (20 mg total) by mouth daily. 30 tablet 0   melatonin 5 MG TABS Take 5 mg by mouth at bedtime.     methocarbamol (ROBAXIN) 500 MG tablet Take 1 tablet (500 mg total) by mouth every 8 (eight) hours. (Patient taking differently: Take 500 mg by mouth 2 (two) times daily at 8 am and 10 pm.) 90 tablet 0   Multiple Vitamin (MULTIVITAMIN) tablet Take 1 tablet by mouth daily at 12 noon.     Multiple Vitamins-Minerals (PRESERVISION AREDS 2) CAPS Take 1 capsule by mouth 2 (two) times daily.     naphazoline-glycerin (CLEAR EYES REDNESS) 0.012-0.25 % SOLN Place 1-2 drops into both eyes 4 (four) times daily as needed for eye irritation.     OVER THE COUNTER MEDICATION Take 1 capsule by mouth daily. Neuriva     pantoprazole (PROTONIX) 40 MG tablet Take 40 mg by mouth daily.     Probiotic Product (PROBIOTIC PO) Take 1  capsule by mouth daily.     simvastatin (ZOCOR) 20 MG tablet Take 20 mg by mouth every evening.     tamsulosin (FLOMAX) 0.4 MG CAPS capsule Take 1 capsule (0.4 mg total) by mouth daily. 30 capsule 0   traMADol (ULTRAM) 50 MG tablet Take 1 tablet (50 mg total) by mouth every 6 (six) hours as needed for moderate pain or severe pain. 15 tablet 0   vitamin B-12 (CYANOCOBALAMIN) 1000 MCG tablet Take 1,000 mcg by mouth daily.     warfarin (COUMADIN) 5 MG tablet 5 mg daily on Monday, Wednesday, Friday and Sunday 2.5 mg daily on Tuesday, Thursday and Saturday (Patient taking differently: 2.5mg  Tues and Fri, 5 mg other days)     No current facility-administered medications for this visit.     Past Medical History:  Diagnosis Date   BPV  (benign positional vertigo)    Chronic neck pain    C2-3 arthropathy   CKD (chronic kidney disease), stage II    Diverticulosis of colon    GERD (gastroesophageal reflux disease)    Hiatal hernia    History of breast cancer per pt no recurrence   dx 2004-- Left breast DCIS (ER/PR+, HER2 negative) s/p  partial masectomy w/ sln bx and  chemoradiotion (completed 2004)   History of herpes zoster    History of kidney stones    HTN (hypertension)    Hyperlipidemia    Irritable bowel syndrome    LBBB (left bundle branch block)    PAF (paroxysmal atrial fibrillation) (HCC)    a. CHA2DS2VASc = 4-->coumadin.   Parkinsonism    neurologist-  dr tat   PONV (postoperative nausea and vomiting)    S/P aortic valve replacement with prosthetic valve    a. 04/13/2006 s/p St Jude mechnical AVR for severe AS;  b. 09/2014 Echo: EF 40-45%, gr1 DD, mild AI/MR, triv TR/PR.   Squamous cell carcinoma of skin 04/06/2022   right nasal sidewall   SUI (stress urinary incontinence, female)    Symptomatic anemia    Venous varices      ROS:   All systems reviewed and negative except as noted in the HPI.   Past Surgical History:  Procedure Laterality Date   ANTERIOR AND POSTERIOR REPAIR  1990   cystocele/ rectocele   AORTIC VALVE REPLACEMENT  04/13/06   dr gerhardt   and Replacement Ascending Aorta (St. Jude mechanical prosthesis)   APPENDECTOMY  child 1950   BREAST EXCISIONAL BIOPSY Left    BREAST LUMPECTOMY Left 01/11/2003   malignant   BREAST SURGERY Left 2004   CARDIAC CATHETERIZATION  02-22-2001   dr Martinique   minimal non-obstructive cad/  mild aortic stenosis and aortic root enlargement/  normal LVF, ef 65%   CARDIAC CATHETERIZATION  03-22-2006    dr Martinique   no significant obstructive cad/  severe aortic stenosis and mild to moderate aortic root enlargement/  upper normal right heart pressures   CARDIOVASCULAR STRESS TEST  09-30-2011   dr Martinique   lexiscan nuclear study w/ apical thinning  vs   small prior apical infarct w/ no significant ischemia/  hypokinesis of the distal septum and apex, ef 50%   CARPAL TUNNEL RELEASE Bilateral left 09-09-2004/  right 2007   CATARACT EXTRACTION W/ INTRAOCULAR LENS  IMPLANT, BILATERAL  2015   COLONOSCOPY N/A 10/03/2014   Procedure: COLONOSCOPY;  Surgeon: [name], MD;  Location: [facility];  Service: Endoscopy;  Laterality: N/A;   CYSTOSCOPY WITH RETROGRADE PYELOGRAM,  URETEROSCOPY AND STENT PLACEMENT Right 09/17/2016   Procedure: CYSTOSCOPY WITH RIGHT RETROGRADE PYELOGRAM, RIGHT URETEROSCOPY AND STENT PLACEMENT LASER LITHOTRIPSY, RIGHT STENT PLACEMENT;  Surgeon: [name], MD;  [facility];  Service: Urology;  Laterality: Right;   ESOPHAGEAL MANOMETRY N/A 08/12/2014   Procedure: ESOPHAGEAL MANOMETRY (EM);  Surgeon: [name], MD;  Location: [facility];  Service: Endoscopy;  Laterality: N/A;   ESOPHAGOGASTRODUODENOSCOPY N/A 10/03/2014   Procedure: ESOPHAGOGASTRODUODENOSCOPY (EGD);  Surgeon: [name], MD;  Location: [facility];  Service: Endoscopy;  Laterality: N/A;   ESOPHAGOGASTRODUODENOSCOPY N/A 12/08/2018   Procedure: ESOPHAGOGASTRODUODENOSCOPY (EGD);  Surgeon: Doran Stabler, MD;  Location: [facility];  Service: Gastroenterology;  Laterality: N/A;   EXTRACORPOREAL SHOCK WAVE LITHOTRIPSY  1997   FOOT SURGERY Right 2013   hammertoe x2   HOLMIUM LASER APPLICATION Right [id number]   Procedure: HOLMIUM LASER APPLICATION;  Surgeon: [name], MD;  Location[facility];  Service: Urology;  Laterality: Right;   PERCUTANEOUS NEPHROSTOLITHOTOMY  1993   STONE EXTRACTION WITH BASKET Right 09/17/2016   Procedure: STONE EXTRACTION WITH BASKET;  Surgeon: [name], MD;  Location: [facility];  Service: Urology;  Laterality: Right;   TOTAL ABDOMINAL HYSTERECTOMY  1971   w/ Bilateral Salpingoophorectomy   TRANSTHORACIC ECHOCARDIOGRAM  10/24/2014   dr Martinique    hypokinesis of the basal and mid inferoseptal and inferior walls,  ef 40-45%,  grade 1 diastolic dysfunction/  mechanical bileaflet aortic valve noted w/ mild regur., peak grandient 58mmHg, valve area 1.35cm^2/  severe MV calcification without stenosis w/ mild regurg., peak gradient 48mmHg/  mild LAE/  poss. atrial mass (MRI showed benign lipomatous hypertrophy of atrial septum) Frazier Butt PR/mild TR     Family History  Problem Relation Age of Onset   Heart disease Mother    Heart disease Father 49       CABG   Other Brother        AVR   Healthy Child      Social History   Socioeconomic History   Marital status: Married    Spouse name: Not on file   Number of children: 3   Years of education: 11   Highest education level: 11th grade  Occupational History   Occupation: Press photographer: OTHER    Comment: retired  Tobacco Use   Smoking status: Never   Smokeless tobacco: Never  Scientific laboratory technician Use: Never used  Substance and Sexual Activity   Alcohol use: No    Alcohol/week: 0.0 standard drinks of alcohol   Drug use: No   Sexual activity: Not on file  Other Topics Concern   Not on file  Social History Narrative      1/2 -1 soda a day     Right handed   Two story home   lives with spouse   Social Determinants of Health   Financial Resource Strain: Not on file  Food Insecurity: Not on file  Transportation Needs: Not on file  Physical Activity: Not on file  Stress: Not on file  Social Connections: Not on file  Intimate Partner Violence: Not on file     BP 108/62   Pulse 67   Ht 5' (1.524 m)   Wt 114 lb (51.7 kg)   SpO2 90%   BMI 22.26 kg/m   Physical Exam:  Well appearing NAD HEENT: Unremarkable Neck:  No JVD, no thyromegally Lymphatics:  No adenopathy  Back:  No CVA tenderness Lungs:  Clear with no wheezes HEART:  Regular rate rhythm, no murmurs, no rubs, no clicks Abd:  soft, positive bowel sounds, no organomegally, no rebound, no  guarding Ext:  2 plus pulses, no edema, no cyanosis, no clubbing Skin:  No rashes no nodules Neuro:  CN II through XII intact, motor grossly intact  EKG - nsr with LBBB  DEVICE  Normal device function.  See PaceArt for details.   Assess/Plan: 1. PAF - she is maintaining NSR despite stopping low dose amiodarone. She will continue with her current meds. I suspect that she will return to atrial fib and we will consider additional treatment if/when she does. 2. HTN - her blood pressure log has been reviewed and she is in the 120-130 mostly.  3. Coags - she will continue warfarin. She may need a little more with a reduction in amio dosing. She will follow up with Dr. Sharlett Iles. 4. LV dysfunction and LBBB - she is asymptomatic. I discussed the warning symptoms and if she is not passing out or having CHF, no indication for a biv PPM.   Salome Spotted

## 2023-03-30 ENCOUNTER — Encounter: Payer: Medicare HMO | Attending: Registered Nurse | Admitting: Registered Nurse

## 2023-03-30 ENCOUNTER — Encounter: Payer: Self-pay | Admitting: Registered Nurse

## 2023-03-30 VITALS — BP 120/73 | HR 72 | Ht 60.0 in | Wt 117.2 lb

## 2023-03-30 DIAGNOSIS — S42002A Fracture of unspecified part of left clavicle, initial encounter for closed fracture: Secondary | ICD-10-CM | POA: Diagnosis not present

## 2023-03-30 DIAGNOSIS — S065X0D Traumatic subdural hemorrhage without loss of consciousness, subsequent encounter: Secondary | ICD-10-CM

## 2023-03-30 NOTE — Progress Notes (Signed)
Subjective:    Patient ID: [name], female    DOB: Mar 17, 1941, 82 y.o.   MRN: [id number]  HPI: [name] is a 82 y.o. female who is here for Transitional Care Visit for F/U of her TBI, Traumatic Subdural hematoma and fracture of unspecified part of left clavicle. On 02/24/2023 she fell exiting her car and fell forward.   HPI: Christy Lamer Pa-C  HPI: [name] is a 82 y.o. female with hx of Progressive supranuclear palsy, BPV, HTN, HLD, CKD2, PAF and mechanical AVR on Coumadin (INR 2.4) who presented to the ED after a fall. Patient was attempting to get out of her car when she fell. At baseline she has significant fall hx and cannot ambulate without assistive devices/husband's support. Found to have a small laceration to the R forehead with an arterial bleed that was closed with figure of 8 stitch.  A large pon developed from this, but it did stop bleeding. Complains of L chest wall pain and L shoulder pain. Airway intact. HDS in trauma bay. CXR and pelvic xrays done. CTH w/ R SDH and contusion of the R frontal lobe + vertex contusions. CT C-Spine negative, but with pain in her midline with ROM. L clavicle fx on films. CT CAP pending.   CT Head: WO Contrast:  IMPRESSION: 1. Involutional changes and chronic periventricular white-matter small-vessel ischemia. 2. Very small right frontotemporal subdural hematoma. No associated mass effect or shift. 3. Petechial hemorrhages consistent with contusion noted right frontal lobe and vertex.  CT Cervical Spine:  IMPRESSION: No evidence of acute intracranial or cervical spine injury.   DG Left Clavicle: IMPRESSION: Displaced fracture of the distal clavicle.   DG Pelvis: IMPRESSION: 1. No acute fracture or malalignment. 2. Multilevel degenerate disc disease of the lower lumbar spine.     CT Head: WO Contrast IMPRESSION: 1. Slight interval decrease in size of trace subdural hemorrhage overlying the right  frontotemporal convexity, now measuring up to 2 mm in thickness. No significant mass effect. 2. Few subcentimeter cortical contusions involving the anterior parasagittal right frontal lobe, stable. 3. Soft tissue contusion at the right frontal scalp. No calvarial fracture. 4. No other new acute intracranial abnormality.  [name] was admitted to inpatient Rehabilitation on 03/08/2023 and discharged home on 03/16/2023. She is receiving home health therapy from [facility].  She state she has pain at laceration site. She rates her pain 3.   Husband in room all questions answered.    .  Pain Inventory Average Pain 4 Pain Right Now 3 My pain is  .  LOCATION OF PAIN  head,toes  BOWEL Number of stools per week: 10   BLADDER Normal    Mobility walk with assistance use a walker ability to climb steps?  yes do you drive?  no use a wheelchair needs help with transfers  Function retired I need assistance with the following:  dressing, bathing, meal prep, household duties, and shopping  Neuro/Psych No problems in this area  Prior Studies Any changes since last visit?  no  Physicians involved in your care Any changes since last visit?  no   Family History  Problem Relation Age of Onset   Heart disease Mother    Heart disease Father 18       CABG   Other Brother        AVR   Healthy Child    Social History   Socioeconomic History   Marital status: Married  Spouse name: Not on file   Number of children: 3   Years of education: 48   Highest education level: 11th grade  Occupational History   Occupation: Press photographer: OTHER    Comment: retired  Tobacco Use   Smoking status: Never   Smokeless tobacco: Never  Vaping Use   Vaping Use: Never used  Substance and Sexual Activity   Alcohol use: No    Alcohol/week: 0.0 standard drinks of alcohol   Drug use: No   Sexual activity: Not on file  Other Topics Concern   Not on file  Social History  Narrative      1/2 -1 soda a day     Right handed   Two story home   lives with spouse   Social Determinants of Health   Financial Resource Strain: Not on file  Food Insecurity: Not on file  Transportation Needs: Not on file  Physical Activity: Not on file  Stress: Not on file  Social Connections: Not on file   Past Surgical History:  Procedure Laterality Date   [place]   cystocele/ rectocele   AORTIC VALVE REPLACEMENT  04/13/06   dr gerhardt   and Replacement Ascending Aorta (St. Jude mechanical prosthesis)   APPENDECTOMY  child 1950   BREAST EXCISIONAL BIOPSY Left    BREAST LUMPECTOMY Left 01/11/2003   malignant   BREAST SURGERY Left 2004   CARDIAC CATHETERIZATION  02-22-2001   dr Martinique   minimal non-obstructive cad/  mild aortic stenosis and aortic root enlargement/  normal LVF, ef 65%   CARDIAC CATHETERIZATION  03-22-2006    dr Martinique   no significant obstructive cad/  severe aortic stenosis and mild to moderate aortic root enlargement/  upper normal right heart pressures   CARDIOVASCULAR STRESS TEST  09-30-2011   dr Martinique   lexiscan nuclear study w/ apical thinning  vs  small prior apical infarct w/ no significant ischemia/  hypokinesis of the distal septum and apex, ef 50%   CARPAL TUNNEL RELEASE Bilateral left 09-09-2004/  right 2007   CATARACT EXTRACTION W/ INTRAOCULAR LENS  IMPLANT, BILATERAL  2015   COLONOSCOPY N/A 10/03/2014   Procedure: COLONOSCOPY;  Surgeon: [name], MD;  Location: [facility] ENDOSCOPY;  Service: Endoscopy;  Laterality: N/A;   CYSTOSCOPY WITH RETROGRADE PYELOGRAM, URETEROSCOPY AND STENT PLACEMENT Right 09/17/2016   Procedure: CYSTOSCOPY WITH RIGHT RETROGRADE PYELOGRAM, RIGHT URETEROSCOPY AND STENT PLACEMENT LASER LITHOTRIPSY, RIGHT STENT PLACEMENT;  Surgeon: [name], MD;  Location: [facility];  Service: Urology;  Laterality: Right;   ESOPHAGEAL MANOMETRY N/A 08/12/2014   Procedure: ESOPHAGEAL  MANOMETRY (EM);  Surgeon: [name], MD;  Location: [facility] ENDOSCOPY;  Service: Endoscopy;  Laterality: N/A;   ESOPHAGOGASTRODUODENOSCOPY N/A 10/03/2014   Procedure: ESOPHAGOGASTRODUODENOSCOPY (EGD);  Surgeon: [name], MD;  Location: [facility] ENDOSCOPY;  Service: Endoscopy;  Laterality: N/A;   ESOPHAGOGASTRODUODENOSCOPY N/A 12/08/2018   Procedure: ESOPHAGOGASTRODUODENOSCOPY (EGD);  Surgeon: Doran Stabler, MD;  Location: [place];  Service: Gastroenterology;  Laterality: N/A;   EXTRACORPOREAL SHOCK WAVE LITHOTRIPSY  1997   FOOT SURGERY Right 2013   hammertoe x2   HOLMIUM LASER APPLICATION Right [id number]   Procedure: HOLMIUM LASER APPLICATION;  Surgeon: [name], MD;  Location: [facility];  Service: Urology;  Laterality: Right;   PERCUTANEOUS NEPHROSTOLITHOTOMY  1993   STONE EXTRACTION WITH BASKET Right 09/17/2016   Procedure: STONE EXTRACTION WITH BASKET;  Surgeon: Marland Kitchen  [name], MD;  Location: [facility];  Service: Urology;  Laterality: Right;   TOTAL ABDOMINAL HYSTERECTOMY  1971   w/ Bilateral Salpingoophorectomy   TRANSTHORACIC ECHOCARDIOGRAM  10/24/2014   dr [place]   hypokinesis of the basal and mid inferoseptal and inferior walls,  ef 40-45%,  grade 1 diastolic dysfunction/  mechanical bileaflet aortic valve noted w/ mild regur., peak grandient 46mmHg, valve area 1.35cm^2/  severe MV calcification without stenosis w/ mild regurg., peak gradient 33mmHg/  mild LAE/  poss. atrial mass (MRI showed benign lipomatous hypertrophy of atrial septum) Frazier Butt PR/mild TR   Past Medical History:  Diagnosis Date   BPV (benign positional vertigo)    Chronic neck pain    C2-3 arthropathy   CKD (chronic kidney disease), stage II    Diverticulosis of colon    GERD (gastroesophageal reflux disease)    Hiatal hernia    History of breast cancer per pt no recurrence   dx 2004-- Left breast DCIS (ER/PR+, HER2 negative) s/p  partial masectomy w/ sln bx  and  chemoradiotion (completed 2004)   History of herpes zoster    History of kidney stones    HTN (hypertension)    Hyperlipidemia    Irritable bowel syndrome    LBBB (left bundle branch block)    PAF (paroxysmal atrial fibrillation)    a. CHA2DS2VASc = 4-->coumadin.   Parkinsonism    neurologist-  dr tat   PONV (postoperative nausea and vomiting)    S/P aortic valve replacement with prosthetic valve    a. 04/13/2006 s/p St Jude mechnical AVR for severe AS;  b. 09/2014 Echo: EF 40-45%, gr1 DD, mild AI/MR, triv TR/PR.   Squamous cell carcinoma of skin 04/06/2022   right nasal sidewall   SUI (stress urinary incontinence, female)    Symptomatic anemia    Venous varices     BP 120/73   Pulse 72   Ht 5' (1.524 m)   Wt 117 lb 3.2 oz (53.2 kg)   SpO2 91%   BMI 22.89 kg/m   Opioid Risk Score:   Fall Risk Score:  `1  Depression screen [facility] 2/9     03/30/2023   10:28 AM  Depression screen PHQ 2/9  Decreased Interest 0  Down, Depressed, Hopeless 0  PHQ - 2 Score 0  Altered sleeping 0  Tired, decreased energy 0  Change in appetite 0  Feeling bad or failure about yourself  0  Trouble concentrating 0  Moving slowly or fidgety/restless 3  Suicidal thoughts 0  PHQ-9 Score 3  Difficult doing work/chores Not difficult at all      Review of Systems  Musculoskeletal:  Positive for gait problem.  Neurological:  Positive for headaches.  All other systems reviewed and are negative.     Objective:   Physical Exam Vitals and nursing note reviewed.  Constitutional:      Appearance: Normal appearance.  HENT:     Head:     Comments: Right laceration site  Cardiovascular:     Rate and Rhythm: Normal rate and regular rhythm.     Pulses: Normal pulses.     Heart sounds: Normal heart sounds.  Musculoskeletal:     Cervical back: Normal range of motion and neck supple.     Comments: Normal Muscle Bulk and Muscle Testing Reveals:  Upper Extremities: Right: Full ROM and Muscle  Strength 5/5 Left Upper Extremity: Decreased ROM 45 Degrees and Muscle Strength 5/5 Thoracic Paraspinal Tenderness: T-3-T-6 Mainly  Left Side Lower Extremities: Full ROM and Muscle Strength 5/5 Arises from Table slowly assistance from husband with waist belt Shuffle Gait     Skin:    General: Skin is warm and dry.  Neurological:     Mental Status: She is alert.         Assessment & Plan:  TBI, Traumatic Subdural hematoma: Neurosurgery following. Continue Outpatient therapy with [facility]. Continue to Monitor, Fracture of unspecified part of left clavicle.: Dr Marcelino Scot following. Continue outpatient therapy with [facility]. Continue to Monitor.   F/U with Dr Tressa Busman in 4-6 weeks

## 2023-03-31 ENCOUNTER — Ambulatory Visit (HOSPITAL_COMMUNITY)
Admission: RE | Admit: 2023-03-31 | Discharge: 2023-03-31 | Disposition: A | Discharge status: Home / Self Care | Payer: Medicare HMO | Source: Ambulatory Visit | Attending: Neurological Surgery | Admitting: Neurological Surgery

## 2023-03-31 ENCOUNTER — Other Ambulatory Visit: Payer: Self-pay | Admitting: Neurology

## 2023-03-31 ENCOUNTER — Other Ambulatory Visit: Payer: Self-pay

## 2023-03-31 ENCOUNTER — Other Ambulatory Visit: Payer: Self-pay | Admitting: Cardiology

## 2023-03-31 DIAGNOSIS — G231 Progressive supranuclear ophthalmoplegia [Steele-Richardson-Olszewski]: Secondary | ICD-10-CM

## 2023-03-31 DIAGNOSIS — S0003XA Contusion of scalp, initial encounter: Secondary | ICD-10-CM | POA: Diagnosis not present

## 2023-03-31 DIAGNOSIS — S06360A Traumatic hemorrhage of cerebrum, unspecified, without loss of consciousness, initial encounter: Secondary | ICD-10-CM | POA: Insufficient documentation

## 2023-03-31 MED ORDER — FUROSEMIDE 20 MG PO TABS: 20.0000 mg | ORAL_TABLET | Freq: Every day | ORAL | 0 refills | Status: DC

## 2023-04-01 DIAGNOSIS — S065X0D Traumatic subdural hemorrhage without loss of consciousness, subsequent encounter: Secondary | ICD-10-CM | POA: Diagnosis not present

## 2023-04-06 DIAGNOSIS — D649 Anemia, unspecified: Secondary | ICD-10-CM | POA: Diagnosis not present

## 2023-04-06 DIAGNOSIS — Z7901 Long term (current) use of anticoagulants: Secondary | ICD-10-CM | POA: Diagnosis not present

## 2023-04-06 DIAGNOSIS — S065X0D Traumatic subdural hemorrhage without loss of consciousness, subsequent encounter: Secondary | ICD-10-CM | POA: Diagnosis not present

## 2023-04-06 DIAGNOSIS — Z952 Presence of prosthetic heart valve: Secondary | ICD-10-CM | POA: Diagnosis not present

## 2023-04-06 DIAGNOSIS — I480 Paroxysmal atrial fibrillation: Secondary | ICD-10-CM | POA: Diagnosis not present

## 2023-04-17 DIAGNOSIS — D62 Acute posthemorrhagic anemia: Secondary | ICD-10-CM | POA: Diagnosis not present

## 2023-04-17 DIAGNOSIS — I110 Hypertensive heart disease with heart failure: Secondary | ICD-10-CM | POA: Diagnosis not present

## 2023-04-17 DIAGNOSIS — I480 Paroxysmal atrial fibrillation: Secondary | ICD-10-CM | POA: Diagnosis not present

## 2023-04-17 DIAGNOSIS — G20C Parkinsonism, unspecified: Secondary | ICD-10-CM | POA: Diagnosis not present

## 2023-04-17 DIAGNOSIS — I493 Ventricular premature depolarization: Secondary | ICD-10-CM | POA: Diagnosis not present

## 2023-04-17 DIAGNOSIS — S42002D Fracture of unspecified part of left clavicle, subsequent encounter for fracture with routine healing: Secondary | ICD-10-CM | POA: Diagnosis not present

## 2023-04-17 DIAGNOSIS — I4892 Unspecified atrial flutter: Secondary | ICD-10-CM | POA: Diagnosis not present

## 2023-04-17 DIAGNOSIS — I5042 Chronic combined systolic (congestive) and diastolic (congestive) heart failure: Secondary | ICD-10-CM | POA: Diagnosis not present

## 2023-04-17 DIAGNOSIS — N183 Chronic kidney disease, stage 3 unspecified: Secondary | ICD-10-CM | POA: Diagnosis not present

## 2023-04-17 DIAGNOSIS — D631 Anemia in chronic kidney disease: Secondary | ICD-10-CM | POA: Diagnosis not present

## 2023-04-19 DIAGNOSIS — I5042 Chronic combined systolic (congestive) and diastolic (congestive) heart failure: Secondary | ICD-10-CM | POA: Diagnosis not present

## 2023-04-19 DIAGNOSIS — I4892 Unspecified atrial flutter: Secondary | ICD-10-CM | POA: Diagnosis not present

## 2023-04-19 DIAGNOSIS — G20C Parkinsonism, unspecified: Secondary | ICD-10-CM | POA: Diagnosis not present

## 2023-04-19 DIAGNOSIS — N183 Chronic kidney disease, stage 3 unspecified: Secondary | ICD-10-CM | POA: Diagnosis not present

## 2023-04-19 DIAGNOSIS — I110 Hypertensive heart disease with heart failure: Secondary | ICD-10-CM | POA: Diagnosis not present

## 2023-04-19 DIAGNOSIS — D62 Acute posthemorrhagic anemia: Secondary | ICD-10-CM | POA: Diagnosis not present

## 2023-04-19 DIAGNOSIS — S42002D Fracture of unspecified part of left clavicle, subsequent encounter for fracture with routine healing: Secondary | ICD-10-CM | POA: Diagnosis not present

## 2023-04-19 DIAGNOSIS — D631 Anemia in chronic kidney disease: Secondary | ICD-10-CM | POA: Diagnosis not present

## 2023-04-19 DIAGNOSIS — I493 Ventricular premature depolarization: Secondary | ICD-10-CM | POA: Diagnosis not present

## 2023-04-19 DIAGNOSIS — I480 Paroxysmal atrial fibrillation: Secondary | ICD-10-CM | POA: Diagnosis not present

## 2023-04-20 DIAGNOSIS — D631 Anemia in chronic kidney disease: Secondary | ICD-10-CM | POA: Diagnosis not present

## 2023-04-20 DIAGNOSIS — I110 Hypertensive heart disease with heart failure: Secondary | ICD-10-CM | POA: Diagnosis not present

## 2023-04-20 DIAGNOSIS — N183 Chronic kidney disease, stage 3 unspecified: Secondary | ICD-10-CM | POA: Diagnosis not present

## 2023-04-20 DIAGNOSIS — I4892 Unspecified atrial flutter: Secondary | ICD-10-CM | POA: Diagnosis not present

## 2023-04-20 DIAGNOSIS — G20C Parkinsonism, unspecified: Secondary | ICD-10-CM | POA: Diagnosis not present

## 2023-04-20 DIAGNOSIS — I480 Paroxysmal atrial fibrillation: Secondary | ICD-10-CM | POA: Diagnosis not present

## 2023-04-20 DIAGNOSIS — I493 Ventricular premature depolarization: Secondary | ICD-10-CM | POA: Diagnosis not present

## 2023-04-20 DIAGNOSIS — S42002D Fracture of unspecified part of left clavicle, subsequent encounter for fracture with routine healing: Secondary | ICD-10-CM | POA: Diagnosis not present

## 2023-04-20 DIAGNOSIS — I5042 Chronic combined systolic (congestive) and diastolic (congestive) heart failure: Secondary | ICD-10-CM | POA: Diagnosis not present

## 2023-04-20 DIAGNOSIS — D62 Acute posthemorrhagic anemia: Secondary | ICD-10-CM | POA: Diagnosis not present

## 2023-04-25 DIAGNOSIS — D62 Acute posthemorrhagic anemia: Secondary | ICD-10-CM | POA: Diagnosis not present

## 2023-04-25 DIAGNOSIS — I493 Ventricular premature depolarization: Secondary | ICD-10-CM | POA: Diagnosis not present

## 2023-04-25 DIAGNOSIS — G20C Parkinsonism, unspecified: Secondary | ICD-10-CM | POA: Diagnosis not present

## 2023-04-25 DIAGNOSIS — I4892 Unspecified atrial flutter: Secondary | ICD-10-CM | POA: Diagnosis not present

## 2023-04-25 DIAGNOSIS — N183 Chronic kidney disease, stage 3 unspecified: Secondary | ICD-10-CM | POA: Diagnosis not present

## 2023-04-25 DIAGNOSIS — I5042 Chronic combined systolic (congestive) and diastolic (congestive) heart failure: Secondary | ICD-10-CM | POA: Diagnosis not present

## 2023-04-25 DIAGNOSIS — S42002D Fracture of unspecified part of left clavicle, subsequent encounter for fracture with routine healing: Secondary | ICD-10-CM | POA: Diagnosis not present

## 2023-04-25 DIAGNOSIS — I110 Hypertensive heart disease with heart failure: Secondary | ICD-10-CM | POA: Diagnosis not present

## 2023-04-25 DIAGNOSIS — I480 Paroxysmal atrial fibrillation: Secondary | ICD-10-CM | POA: Diagnosis not present

## 2023-04-25 DIAGNOSIS — D631 Anemia in chronic kidney disease: Secondary | ICD-10-CM | POA: Diagnosis not present

## 2023-04-27 DIAGNOSIS — I110 Hypertensive heart disease with heart failure: Secondary | ICD-10-CM | POA: Diagnosis not present

## 2023-04-27 DIAGNOSIS — I4892 Unspecified atrial flutter: Secondary | ICD-10-CM | POA: Diagnosis not present

## 2023-04-27 DIAGNOSIS — Z952 Presence of prosthetic heart valve: Secondary | ICD-10-CM | POA: Diagnosis not present

## 2023-04-27 DIAGNOSIS — D62 Acute posthemorrhagic anemia: Secondary | ICD-10-CM | POA: Diagnosis not present

## 2023-04-27 DIAGNOSIS — G20C Parkinsonism, unspecified: Secondary | ICD-10-CM | POA: Diagnosis not present

## 2023-04-27 DIAGNOSIS — N183 Chronic kidney disease, stage 3 unspecified: Secondary | ICD-10-CM | POA: Diagnosis not present

## 2023-04-27 DIAGNOSIS — D631 Anemia in chronic kidney disease: Secondary | ICD-10-CM | POA: Diagnosis not present

## 2023-04-27 DIAGNOSIS — I480 Paroxysmal atrial fibrillation: Secondary | ICD-10-CM | POA: Diagnosis not present

## 2023-04-27 DIAGNOSIS — I493 Ventricular premature depolarization: Secondary | ICD-10-CM | POA: Diagnosis not present

## 2023-04-27 DIAGNOSIS — Z7901 Long term (current) use of anticoagulants: Secondary | ICD-10-CM | POA: Diagnosis not present

## 2023-04-27 DIAGNOSIS — S42002D Fracture of unspecified part of left clavicle, subsequent encounter for fracture with routine healing: Secondary | ICD-10-CM | POA: Diagnosis not present

## 2023-04-27 DIAGNOSIS — I5042 Chronic combined systolic (congestive) and diastolic (congestive) heart failure: Secondary | ICD-10-CM | POA: Diagnosis not present

## 2023-05-01 ENCOUNTER — Other Ambulatory Visit: Payer: Self-pay | Admitting: Physician Assistant

## 2023-05-02 DIAGNOSIS — D62 Acute posthemorrhagic anemia: Secondary | ICD-10-CM | POA: Diagnosis not present

## 2023-05-02 DIAGNOSIS — G20C Parkinsonism, unspecified: Secondary | ICD-10-CM | POA: Diagnosis not present

## 2023-05-02 DIAGNOSIS — I4892 Unspecified atrial flutter: Secondary | ICD-10-CM | POA: Diagnosis not present

## 2023-05-02 DIAGNOSIS — D631 Anemia in chronic kidney disease: Secondary | ICD-10-CM | POA: Diagnosis not present

## 2023-05-02 DIAGNOSIS — I493 Ventricular premature depolarization: Secondary | ICD-10-CM | POA: Diagnosis not present

## 2023-05-02 DIAGNOSIS — I5042 Chronic combined systolic (congestive) and diastolic (congestive) heart failure: Secondary | ICD-10-CM | POA: Diagnosis not present

## 2023-05-02 DIAGNOSIS — S42002D Fracture of unspecified part of left clavicle, subsequent encounter for fracture with routine healing: Secondary | ICD-10-CM | POA: Diagnosis not present

## 2023-05-02 DIAGNOSIS — I480 Paroxysmal atrial fibrillation: Secondary | ICD-10-CM | POA: Diagnosis not present

## 2023-05-02 DIAGNOSIS — I110 Hypertensive heart disease with heart failure: Secondary | ICD-10-CM | POA: Diagnosis not present

## 2023-05-02 DIAGNOSIS — N183 Chronic kidney disease, stage 3 unspecified: Secondary | ICD-10-CM | POA: Diagnosis not present

## 2023-05-03 NOTE — Progress Notes (Unsigned)
Subjective:    Patient ID: [name], female    DOB: 07-Apr-1941, 82 y.o.   MRN: [id number]  HPI   [name] is a 82 y.o. year old female  who  has a past medical history of BPV (benign positional vertigo), Chronic neck pain, CKD (chronic kidney disease), stage II, Diverticulosis of colon, GERD (gastroesophageal reflux disease), Hiatal hernia, History of breast cancer (per pt no recurrence), History of herpes zoster, History of kidney stones, HTN (hypertension), Hyperlipidemia, Irritable bowel syndrome, LBBB (left bundle branch block), PAF (paroxysmal atrial fibrillation) (HCC), Parkinsonism, PONV (postoperative nausea and vomiting), S/P aortic valve replacement with prosthetic valve, Squamous cell carcinoma of skin (04/06/2022), SUI (stress urinary incontinence, female), Symptomatic anemia, and Venous varices ( ).   They are presenting to PM&R clinic for follow up related to IPR stay for TBI .  Plan from last visit: TBI, Traumatic Subdural hematoma: Neurosurgery following. Continue Outpatient therapy with [facility]. Continue to Monitor, Fracture of unspecified part of left clavicle.: Dr Carola Frost following. Continue outpatient therapy with [facility]. Continue to Monitor.    Interval Hx:  - Therapies: PT at [facility] discharged; OT and SLP are finishing up. Home therapies have been helpful but not as much as the inpatient therapies.   - Follow ups: Neurosurgery followed up; discharged her. Dr. Carola Frost also followed up for broken shoulder, and she was discharged from them as well. She saw her cardiologist and PCP as well.    - Falls: None; one close call when sitting down on a commode too quickly.    - DME: Not currently using a walker or cane. Husband has been holding her gait belt and her right hand to offload her shoulder, but since she was discharged from Dr. Carola Frost has not tried walker. Husband feels comfortable with their current system.    - Medications: Have been  adjusting her coumadin quite a bit. They have altered her diet and have been trying interventions.    - Other concerns: Had hammertoe repaired and revised on the right (unsure of surgeon; no record in procedures) at [facility] building. Want to get L hammertoe repaired due to pain.    Pain Inventory Average Pain 8 Pain Right Now 0 My pain is intermittent and aching  LOCATION OF PAIN  Headache  BOWEL Number of stools per week: 5-6   BLADDER Normal and Pads    Mobility walk with assistance ability to climb steps?  yes do you drive?  no needs help with transfers  Function retired I need assistance with the following:  dressing, bathing, toileting, meal prep, household duties, and shopping Do you have any goals in this area?  yes  Neuro/Psych weakness trouble walking dizziness  Prior Studies Any changes since last visit?  yes CT/MRI  Physicians involved in your care Any changes since last visit?  no   Family History  Problem Relation Age of Onset   Heart disease Mother    Heart disease Father 40       CABG   Other Brother        AVR   Healthy Child    Social History   Socioeconomic History   Marital status: Married    Spouse name: Not on file   Number of children: 3   Years of education: 11   Highest education level: 11th grade  Occupational History   Occupation: Chartered loss adjuster: OTHER    Comment: retired  Tobacco Use  Smoking status: Never   Smokeless tobacco: Never  Vaping Use   Vaping Use: Never used  Substance and Sexual Activity   Alcohol use: No    Alcohol/week: 0.0 standard drinks of alcohol   Drug use: No   Sexual activity: Not on file  Other Topics Concern   Not on file  Social History Narrative      1/2 -1 soda a day     Right handed   Two story home   lives with spouse   Social Determinants of Health   Financial Resource Strain: Not on file  Food Insecurity: Not on file  Transportation Needs:  Not on file  Physical Activity: Not on file  Stress: Not on file  Social Connections: Not on file   Past Surgical History:  Procedure Laterality Date   ANTERIOR AND POSTERIOR REPAIR  1990   cystocele/ rectocele   AORTIC VALVE REPLACEMENT  04/13/06   dr gerhardt   and Replacement Ascending Aorta (St. Jude mechanical prosthesis)   APPENDECTOMY  child 1950   BREAST EXCISIONAL BIOPSY Left    BREAST LUMPECTOMY Left 01/11/2003   malignant   BREAST SURGERY Left 2004   CARDIAC CATHETERIZATION  02-22-2001   dr [place]   minimal non-obstructive cad/  mild aortic stenosis and aortic root enlargement/  normal LVF, ef 65%   CARDIAC CATHETERIZATION  03-22-2006    dr [place]   no significant obstructive cad/  severe aortic stenosis and mild to moderate aortic root enlargement/  upper normal right heart pressures   CARDIOVASCULAR STRESS TEST  09-30-2011   dr [place]   lexiscan nuclear study w/ apical thinning  vs  small prior apical infarct w/ no significant ischemia/  hypokinesis of the distal septum and apex, ef 50%   CARPAL TUNNEL RELEASE Bilateral left 09-09-2004/  right 2007   CATARACT EXTRACTION W/ INTRAOCULAR LENS  IMPLANT, BILATERAL  2015   COLONOSCOPY N/A 10/03/2014   Procedure: COLONOSCOPY;  Surgeon: Iva Boop, MD;  Location: [facility] ENDOSCOPY;  Service: Endoscopy;  Laterality: N/A;   CYSTOSCOPY WITH RETROGRADE PYELOGRAM, URETEROSCOPY AND STENT PLACEMENT Right 09/17/2016   Procedure: CYSTOSCOPY WITH RIGHT RETROGRADE PYELOGRAM, RIGHT URETEROSCOPY AND STENT PLACEMENT LASER LITHOTRIPSY, RIGHT STENT PLACEMENT;  Surgeon: Crist Fat, MD;  Location: [facility];  Service: Urology;  Laterality: Right;   ESOPHAGEAL MANOMETRY N/A 08/12/2014   Procedure: ESOPHAGEAL MANOMETRY (EM);  Surgeon: Iva Boop, MD;  Location: [facility] ENDOSCOPY;  Service: Endoscopy;  Laterality: N/A;   ESOPHAGOGASTRODUODENOSCOPY N/A 10/03/2014   Procedure: ESOPHAGOGASTRODUODENOSCOPY (EGD);  Surgeon: Iva Boop,  MD;  Location: [facility] ENDOSCOPY;  Service: Endoscopy;  Laterality: N/A;   ESOPHAGOGASTRODUODENOSCOPY N/A 12/08/2018   Procedure: ESOPHAGOGASTRODUODENOSCOPY (EGD);  Surgeon: Sherrilyn Rist, MD;  Location: [facility] ENDOSCOPY;  Service: Gastroenterology;  Laterality: N/A;   EXTRACORPOREAL SHOCK WAVE LITHOTRIPSY  1997   FOOT SURGERY Right 2013   hammertoe x2   HOLMIUM LASER APPLICATION Right 09/17/2016   Procedure: HOLMIUM LASER APPLICATION;  Surgeon: Crist Fat, MD;  Location: [facility];  Service: Urology;  Laterality: Right;   PERCUTANEOUS NEPHROSTOLITHOTOMY  1993   STONE EXTRACTION WITH BASKET Right 09/17/2016   Procedure: STONE EXTRACTION WITH BASKET;  Surgeon: Crist Fat, MD;  Location: [facility];  Service: Urology;  Laterality: Right;   TOTAL ABDOMINAL HYSTERECTOMY  1971   w/ Bilateral Salpingoophorectomy   TRANSTHORACIC ECHOCARDIOGRAM  10/24/2014   dr [place]   hypokinesis of the basal and mid inferoseptal  and inferior walls,  ef 40-45%,  grade 1 diastolic dysfunction/  mechanical bileaflet aortic valve noted w/ mild regur., peak grandient , valve area 1.35cm^2/  severe MV calcification without stenosis w/ mild regurg., peak gradient 2mmHg/  mild LAE/  poss. atrial mass (MRI showed benign lipomatous hypertrophy of atrial septum) Loretha Brasil PR/mild TR   Past Medical History:  Diagnosis Date   BPV (benign positional vertigo)    Chronic neck pain    C2-3 arthropathy   CKD (chronic kidney disease), stage II    Diverticulosis of colon    GERD (gastroesophageal reflux disease)    Hiatal hernia    History of breast cancer per pt no recurrence   dx 2004-- Left breast DCIS (ER/PR+, HER2 negative) s/p  partial masectomy w/ sln bx and  chemoradiotion (completed 2004)   History of herpes zoster    History of kidney stones    HTN (hypertension)    Hyperlipidemia    Irritable bowel syndrome    LBBB (left bundle branch block)    PAF (paroxysmal atrial  fibrillation) (HCC)    a. CHA2DS2VASc = 4-->coumadin.   Parkinsonism    neurologist-  dr tat   PONV (postoperative nausea and vomiting)    S/P aortic valve replacement with prosthetic valve    a. 04/13/2006 s/p St Jude mechnical AVR for severe AS;  b. 09/2014 Echo: EF 40-45%, gr1 DD, mild AI/MR, triv TR/PR.   Squamous cell carcinoma of skin 04/06/2022   right nasal sidewall   SUI (stress urinary incontinence, female)    Symptomatic anemia    Venous varices     There were no vitals taken for this visit.  Opioid Risk Score:   Fall Risk Score:  `1  Depression screen [facility] 2/9     03/30/2023   10:28 AM  Depression screen PHQ 2/9  Decreased Interest 0  Down, Depressed, Hopeless 0  PHQ - 2 Score 0  Altered sleeping 0  Tired, decreased energy 0  Change in appetite 0  Feeling bad or failure about yourself  0  Trouble concentrating 0  Moving slowly or fidgety/restless 3  Suicidal thoughts 0  PHQ-9 Score 3  Difficult doing work/chores Not difficult at all    Review of Systems  Musculoskeletal:  Positive for gait problem.  Neurological:  Positive for dizziness and weakness.  All other systems reviewed and are negative.     Objective:   Physical Exam  PE: Constitution: Appropriate appearance for age. No apparent distress +soft helmet Resp: No respiratory distress. No accessory muscle usage. on RA and CTAB Cardio: Well perfused appearance.  No peripheral edema. +systolic click.  Abdomen: Nondistended. Nontender.   Psych: Appropriate mood and affect. Neuro: AAOx4. No apparent cognitive deficits   Neurologic Exam:   DTRs: Reflexes were 2+ in bilateral achilles, patella, biceps, BR and triceps. Babinsky: flexor responses b/l.   Hoffmans: negative b/l Sensory exam: revealed normal sensation in all dermatomal regions in bilateral upper extremities and bilateral lower extremities Motor exam: strength 4/5 throughout bilateral upper extremities and bilateral lower  extremities Coordination: Fine motor coordination was mild BL UE ataxia; significant BL LE ataxia Gait:  short stride with some decreased clearance and catching for right heel; left foot inversion. Walks with gait belt and husband supporting right hand from behind; appears stable.           Assessment & Plan:  [name] is a 82 y.o. year old female  who  has a past medical history  of BPV (benign positional vertigo), Chronic neck pain, CKD (chronic kidney disease), stage II, Diverticulosis of colon, GERD (gastroesophageal reflux disease), Hiatal hernia, History of breast cancer (per pt no recurrence), History of herpes zoster, History of kidney stones, HTN (hypertension), Hyperlipidemia, Irritable bowel syndrome, LBBB (left bundle branch block), PAF (paroxysmal atrial fibrillation) (HCC), Parkinsonism, PONV (postoperative nausea and vomiting), S/P aortic valve replacement with prosthetic valve, Squamous cell carcinoma of skin (04/06/2022), SUI (stress urinary incontinence, female), Symptomatic anemia, and Venous varices ( ).   They are presenting to PM&R clinic as a follow up for treatment of TBI .    Traumatic subdural hematoma without loss of consciousness, subsequent encounter Follow up with me in 6 months or earlier if needed Continue following with Neurology for PSP Has been discharged from neurosurgery;   Fracture of unspecified part of left clavicle, initial encounter for closed fracture Discharged from Dr. Magdalene Patricia office Stop robaxin and tramadol; can continue with tylenol Prn for pain  Abnormality of gait and mobility Show OT your gait with walker vs. Husband only with gait belt for support and get input on stability; if need to re-integrate the walker.   Would continue use of gait belt and helmet for safety  Dry Eyes Ask your ophthalmologist about adding eye drops/gel at nighttime; can continue Clear Eyes Redness as needed occasionally  Left Hammertoe Look for wide  toebox shoes to accommodate your left hammertoe I have sent an orthopedics referral for consideration of hammer toe surgery

## 2023-05-04 ENCOUNTER — Encounter: Payer: Self-pay | Admitting: Physical Medicine and Rehabilitation

## 2023-05-04 ENCOUNTER — Encounter: Payer: Medicare HMO | Attending: Physical Medicine and Rehabilitation | Admitting: Physical Medicine and Rehabilitation

## 2023-05-04 VITALS — BP 123/83 | HR 73 | Ht 60.0 in | Wt 113.0 lb

## 2023-05-04 DIAGNOSIS — M2042 Other hammer toe(s) (acquired), left foot: Secondary | ICD-10-CM | POA: Diagnosis not present

## 2023-05-04 DIAGNOSIS — G20C Parkinsonism, unspecified: Secondary | ICD-10-CM | POA: Diagnosis not present

## 2023-05-04 DIAGNOSIS — I5042 Chronic combined systolic (congestive) and diastolic (congestive) heart failure: Secondary | ICD-10-CM | POA: Diagnosis not present

## 2023-05-04 DIAGNOSIS — D62 Acute posthemorrhagic anemia: Secondary | ICD-10-CM | POA: Diagnosis not present

## 2023-05-04 DIAGNOSIS — H04129 Dry eye syndrome of unspecified lacrimal gland: Secondary | ICD-10-CM | POA: Diagnosis not present

## 2023-05-04 DIAGNOSIS — D631 Anemia in chronic kidney disease: Secondary | ICD-10-CM | POA: Diagnosis not present

## 2023-05-04 DIAGNOSIS — R269 Unspecified abnormalities of gait and mobility: Secondary | ICD-10-CM | POA: Insufficient documentation

## 2023-05-04 DIAGNOSIS — N183 Chronic kidney disease, stage 3 unspecified: Secondary | ICD-10-CM | POA: Diagnosis not present

## 2023-05-04 DIAGNOSIS — S065X0D Traumatic subdural hemorrhage without loss of consciousness, subsequent encounter: Secondary | ICD-10-CM | POA: Insufficient documentation

## 2023-05-04 DIAGNOSIS — I480 Paroxysmal atrial fibrillation: Secondary | ICD-10-CM | POA: Diagnosis not present

## 2023-05-04 DIAGNOSIS — I493 Ventricular premature depolarization: Secondary | ICD-10-CM | POA: Diagnosis not present

## 2023-05-04 DIAGNOSIS — S42002D Fracture of unspecified part of left clavicle, subsequent encounter for fracture with routine healing: Secondary | ICD-10-CM | POA: Diagnosis not present

## 2023-05-04 DIAGNOSIS — I110 Hypertensive heart disease with heart failure: Secondary | ICD-10-CM | POA: Diagnosis not present

## 2023-05-04 DIAGNOSIS — I4892 Unspecified atrial flutter: Secondary | ICD-10-CM | POA: Diagnosis not present

## 2023-05-04 DIAGNOSIS — S42002A Fracture of unspecified part of left clavicle, initial encounter for closed fracture: Secondary | ICD-10-CM | POA: Insufficient documentation

## 2023-05-04 NOTE — Patient Instructions (Addendum)
Traumatic subdural hematoma without loss of consciousness, subsequent encounter Follow up with me in 6 months or earlier if needed Continue following with Neurology for PSP Has been discharged from neurosurgery;   Fracture of unspecified part of left clavicle, initial encounter for closed fracture Discharged from Dr. Magdalene Patricia office Stop robaxin and tramadol; can continue with tylenol Prn for pain  Abnormality of gait and mobility Show OT your gait with walker vs. Husband only with gait belt for support and get input on stability; if need to re-integrate the walker.   Would continue use of gait belt and helmet for safety  Dry Eyes Ask your ophthalmologist about adding eye drops/gel at nighttime; can conitnue Clear Eyes Redness as needed occassionally  Left Hammertoe Look for wide toebox shoes to accommodate your left hammertoe I have sent an orthopedics referral for consideration of hammer toe surgery

## 2023-05-08 ENCOUNTER — Other Ambulatory Visit: Payer: Self-pay | Admitting: Physician Assistant

## 2023-05-09 ENCOUNTER — Other Ambulatory Visit: Payer: Self-pay

## 2023-05-09 ENCOUNTER — Other Ambulatory Visit: Payer: Self-pay | Admitting: *Deleted

## 2023-05-09 DIAGNOSIS — D631 Anemia in chronic kidney disease: Secondary | ICD-10-CM | POA: Diagnosis not present

## 2023-05-09 DIAGNOSIS — S42002D Fracture of unspecified part of left clavicle, subsequent encounter for fracture with routine healing: Secondary | ICD-10-CM | POA: Diagnosis not present

## 2023-05-09 DIAGNOSIS — D62 Acute posthemorrhagic anemia: Secondary | ICD-10-CM | POA: Diagnosis not present

## 2023-05-09 DIAGNOSIS — I5042 Chronic combined systolic (congestive) and diastolic (congestive) heart failure: Secondary | ICD-10-CM | POA: Diagnosis not present

## 2023-05-09 DIAGNOSIS — N183 Chronic kidney disease, stage 3 unspecified: Secondary | ICD-10-CM | POA: Diagnosis not present

## 2023-05-09 DIAGNOSIS — G20C Parkinsonism, unspecified: Secondary | ICD-10-CM | POA: Diagnosis not present

## 2023-05-09 DIAGNOSIS — I480 Paroxysmal atrial fibrillation: Secondary | ICD-10-CM | POA: Diagnosis not present

## 2023-05-09 DIAGNOSIS — I493 Ventricular premature depolarization: Secondary | ICD-10-CM | POA: Diagnosis not present

## 2023-05-09 DIAGNOSIS — I110 Hypertensive heart disease with heart failure: Secondary | ICD-10-CM | POA: Diagnosis not present

## 2023-05-09 DIAGNOSIS — I4892 Unspecified atrial flutter: Secondary | ICD-10-CM | POA: Diagnosis not present

## 2023-05-11 DIAGNOSIS — M2012 Hallux valgus (acquired), left foot: Secondary | ICD-10-CM | POA: Diagnosis not present

## 2023-05-11 DIAGNOSIS — M2042 Other hammer toe(s) (acquired), left foot: Secondary | ICD-10-CM | POA: Diagnosis not present

## 2023-05-11 DIAGNOSIS — M2010 Hallux valgus (acquired), unspecified foot: Secondary | ICD-10-CM | POA: Insufficient documentation

## 2023-05-11 DIAGNOSIS — M2040 Other hammer toe(s) (acquired), unspecified foot: Secondary | ICD-10-CM | POA: Insufficient documentation

## 2023-05-16 ENCOUNTER — Other Ambulatory Visit: Payer: Self-pay | Admitting: *Deleted

## 2023-05-16 DIAGNOSIS — I4892 Unspecified atrial flutter: Secondary | ICD-10-CM | POA: Diagnosis not present

## 2023-05-16 DIAGNOSIS — I5042 Chronic combined systolic (congestive) and diastolic (congestive) heart failure: Secondary | ICD-10-CM | POA: Diagnosis not present

## 2023-05-16 DIAGNOSIS — N183 Chronic kidney disease, stage 3 unspecified: Secondary | ICD-10-CM | POA: Diagnosis not present

## 2023-05-16 DIAGNOSIS — I110 Hypertensive heart disease with heart failure: Secondary | ICD-10-CM | POA: Diagnosis not present

## 2023-05-16 DIAGNOSIS — D631 Anemia in chronic kidney disease: Secondary | ICD-10-CM | POA: Diagnosis not present

## 2023-05-16 DIAGNOSIS — I493 Ventricular premature depolarization: Secondary | ICD-10-CM | POA: Diagnosis not present

## 2023-05-16 DIAGNOSIS — I480 Paroxysmal atrial fibrillation: Secondary | ICD-10-CM | POA: Diagnosis not present

## 2023-05-16 DIAGNOSIS — G20C Parkinsonism, unspecified: Secondary | ICD-10-CM | POA: Diagnosis not present

## 2023-05-16 DIAGNOSIS — D62 Acute posthemorrhagic anemia: Secondary | ICD-10-CM | POA: Diagnosis not present

## 2023-05-16 DIAGNOSIS — S42002D Fracture of unspecified part of left clavicle, subsequent encounter for fracture with routine healing: Secondary | ICD-10-CM | POA: Diagnosis not present

## 2023-05-16 MED ORDER — FUROSEMIDE 20 MG PO TABS: ORAL_TABLET | ORAL | 0 refills | Status: DC

## 2023-05-24 DIAGNOSIS — G20C Parkinsonism, unspecified: Secondary | ICD-10-CM | POA: Diagnosis not present

## 2023-05-24 DIAGNOSIS — Z954 Presence of other heart-valve replacement: Secondary | ICD-10-CM | POA: Diagnosis not present

## 2023-05-24 DIAGNOSIS — Z7901 Long term (current) use of anticoagulants: Secondary | ICD-10-CM | POA: Diagnosis not present

## 2023-05-24 DIAGNOSIS — I130 Hypertensive heart and chronic kidney disease with heart failure and stage 1 through stage 4 chronic kidney disease, or unspecified chronic kidney disease: Secondary | ICD-10-CM | POA: Diagnosis not present

## 2023-05-24 DIAGNOSIS — G231 Progressive supranuclear ophthalmoplegia [Steele-Richardson-Olszewski]: Secondary | ICD-10-CM | POA: Diagnosis not present

## 2023-05-24 DIAGNOSIS — Z952 Presence of prosthetic heart valve: Secondary | ICD-10-CM | POA: Diagnosis not present

## 2023-05-24 DIAGNOSIS — S065XAD Traumatic subdural hemorrhage with loss of consciousness status unknown, subsequent encounter: Secondary | ICD-10-CM | POA: Diagnosis not present

## 2023-05-24 DIAGNOSIS — D631 Anemia in chronic kidney disease: Secondary | ICD-10-CM | POA: Diagnosis not present

## 2023-05-24 DIAGNOSIS — N182 Chronic kidney disease, stage 2 (mild): Secondary | ICD-10-CM | POA: Diagnosis not present

## 2023-05-24 DIAGNOSIS — D62 Acute posthemorrhagic anemia: Secondary | ICD-10-CM | POA: Diagnosis not present

## 2023-05-24 DIAGNOSIS — S42032D Displaced fracture of lateral end of left clavicle, subsequent encounter for fracture with routine healing: Secondary | ICD-10-CM | POA: Diagnosis not present

## 2023-05-24 DIAGNOSIS — I480 Paroxysmal atrial fibrillation: Secondary | ICD-10-CM | POA: Diagnosis not present

## 2023-05-24 DIAGNOSIS — I5022 Chronic systolic (congestive) heart failure: Secondary | ICD-10-CM | POA: Diagnosis not present

## 2023-05-24 DIAGNOSIS — Z9181 History of falling: Secondary | ICD-10-CM | POA: Diagnosis not present

## 2023-05-25 ENCOUNTER — Other Ambulatory Visit (HOSPITAL_BASED_OUTPATIENT_CLINIC_OR_DEPARTMENT_OTHER): Payer: Self-pay

## 2023-05-25 MED ORDER — SHINGRIX 50 MCG/0.5ML IM SUSR
0.5000 mL | Freq: Once | INTRAMUSCULAR | 0 refills | Status: AC
  Filled 2023-05-25: qty 0.5, 1d supply, fill #0

## 2023-06-01 DIAGNOSIS — S42032D Displaced fracture of lateral end of left clavicle, subsequent encounter for fracture with routine healing: Secondary | ICD-10-CM | POA: Diagnosis not present

## 2023-06-01 DIAGNOSIS — N182 Chronic kidney disease, stage 2 (mild): Secondary | ICD-10-CM | POA: Diagnosis not present

## 2023-06-01 DIAGNOSIS — G20C Parkinsonism, unspecified: Secondary | ICD-10-CM | POA: Diagnosis not present

## 2023-06-01 DIAGNOSIS — Z9181 History of falling: Secondary | ICD-10-CM | POA: Diagnosis not present

## 2023-06-01 DIAGNOSIS — Z954 Presence of other heart-valve replacement: Secondary | ICD-10-CM | POA: Diagnosis not present

## 2023-06-01 DIAGNOSIS — S065XAD Traumatic subdural hemorrhage with loss of consciousness status unknown, subsequent encounter: Secondary | ICD-10-CM | POA: Diagnosis not present

## 2023-06-01 DIAGNOSIS — G231 Progressive supranuclear ophthalmoplegia [Steele-Richardson-Olszewski]: Secondary | ICD-10-CM | POA: Diagnosis not present

## 2023-06-01 DIAGNOSIS — I130 Hypertensive heart and chronic kidney disease with heart failure and stage 1 through stage 4 chronic kidney disease, or unspecified chronic kidney disease: Secondary | ICD-10-CM | POA: Diagnosis not present

## 2023-06-01 DIAGNOSIS — D62 Acute posthemorrhagic anemia: Secondary | ICD-10-CM | POA: Diagnosis not present

## 2023-06-01 DIAGNOSIS — D631 Anemia in chronic kidney disease: Secondary | ICD-10-CM | POA: Diagnosis not present

## 2023-06-01 DIAGNOSIS — I480 Paroxysmal atrial fibrillation: Secondary | ICD-10-CM | POA: Diagnosis not present

## 2023-06-01 DIAGNOSIS — Z952 Presence of prosthetic heart valve: Secondary | ICD-10-CM | POA: Diagnosis not present

## 2023-06-01 DIAGNOSIS — Z7901 Long term (current) use of anticoagulants: Secondary | ICD-10-CM | POA: Diagnosis not present

## 2023-06-01 DIAGNOSIS — I5022 Chronic systolic (congestive) heart failure: Secondary | ICD-10-CM | POA: Diagnosis not present

## 2023-06-02 DIAGNOSIS — I480 Paroxysmal atrial fibrillation: Secondary | ICD-10-CM | POA: Diagnosis not present

## 2023-06-02 DIAGNOSIS — Z7901 Long term (current) use of anticoagulants: Secondary | ICD-10-CM | POA: Diagnosis not present

## 2023-06-02 DIAGNOSIS — Z952 Presence of prosthetic heart valve: Secondary | ICD-10-CM | POA: Diagnosis not present

## 2023-06-06 DIAGNOSIS — I130 Hypertensive heart and chronic kidney disease with heart failure and stage 1 through stage 4 chronic kidney disease, or unspecified chronic kidney disease: Secondary | ICD-10-CM | POA: Diagnosis not present

## 2023-06-06 DIAGNOSIS — Z954 Presence of other heart-valve replacement: Secondary | ICD-10-CM | POA: Diagnosis not present

## 2023-06-06 DIAGNOSIS — I5022 Chronic systolic (congestive) heart failure: Secondary | ICD-10-CM | POA: Diagnosis not present

## 2023-06-06 DIAGNOSIS — S42032D Displaced fracture of lateral end of left clavicle, subsequent encounter for fracture with routine healing: Secondary | ICD-10-CM | POA: Diagnosis not present

## 2023-06-06 DIAGNOSIS — S065XAD Traumatic subdural hemorrhage with loss of consciousness status unknown, subsequent encounter: Secondary | ICD-10-CM | POA: Diagnosis not present

## 2023-06-06 DIAGNOSIS — Z952 Presence of prosthetic heart valve: Secondary | ICD-10-CM | POA: Diagnosis not present

## 2023-06-06 DIAGNOSIS — N182 Chronic kidney disease, stage 2 (mild): Secondary | ICD-10-CM | POA: Diagnosis not present

## 2023-06-06 DIAGNOSIS — D631 Anemia in chronic kidney disease: Secondary | ICD-10-CM | POA: Diagnosis not present

## 2023-06-06 DIAGNOSIS — Z9181 History of falling: Secondary | ICD-10-CM | POA: Diagnosis not present

## 2023-06-06 DIAGNOSIS — D62 Acute posthemorrhagic anemia: Secondary | ICD-10-CM | POA: Diagnosis not present

## 2023-06-06 DIAGNOSIS — I480 Paroxysmal atrial fibrillation: Secondary | ICD-10-CM | POA: Diagnosis not present

## 2023-06-06 DIAGNOSIS — G20C Parkinsonism, unspecified: Secondary | ICD-10-CM | POA: Diagnosis not present

## 2023-06-06 DIAGNOSIS — G231 Progressive supranuclear ophthalmoplegia [Steele-Richardson-Olszewski]: Secondary | ICD-10-CM | POA: Diagnosis not present

## 2023-06-06 DIAGNOSIS — Z7901 Long term (current) use of anticoagulants: Secondary | ICD-10-CM | POA: Diagnosis not present

## 2023-06-13 ENCOUNTER — Telehealth: Payer: Self-pay | Admitting: Cardiology

## 2023-06-13 MED ORDER — FUROSEMIDE 20 MG PO TABS: ORAL_TABLET | ORAL | 1 refills | Status: DC

## 2023-06-13 NOTE — Telephone Encounter (Signed)
° ° ° °*  STAT* If patient is at the pharmacy, call can be transferred to refill team. ° ° °1. Which medications need to be refilled? (please list name of each medication and dose if known) furosemide (LASIX) 20 MG tablet ° °2. Which pharmacy/location (including street and city if local pharmacy) is medication to be sent to?[id number] - [place] ([place]), [place] - [address] ° °3. Do they need a 30 day or 90 day supply? 90 ° °

## 2023-06-13 NOTE — Telephone Encounter (Signed)
Refills has been sent to the pharmacy. 

## 2023-06-14 ENCOUNTER — Other Ambulatory Visit: Payer: Self-pay

## 2023-06-14 ENCOUNTER — Emergency Department (HOSPITAL_BASED_OUTPATIENT_CLINIC_OR_DEPARTMENT_OTHER): Payer: Medicare HMO

## 2023-06-14 ENCOUNTER — Emergency Department (HOSPITAL_BASED_OUTPATIENT_CLINIC_OR_DEPARTMENT_OTHER)
Admission: EM | Admit: 2023-06-14 | Discharge: 2023-06-14 | Disposition: A | Discharge status: Home / Self Care | Payer: Medicare HMO | Attending: Emergency Medicine | Admitting: Emergency Medicine

## 2023-06-14 ENCOUNTER — Encounter (HOSPITAL_BASED_OUTPATIENT_CLINIC_OR_DEPARTMENT_OTHER): Payer: Self-pay

## 2023-06-14 DIAGNOSIS — Z7902 Long term (current) use of antithrombotics/antiplatelets: Secondary | ICD-10-CM | POA: Diagnosis not present

## 2023-06-14 DIAGNOSIS — S63501A Unspecified sprain of right wrist, initial encounter: Secondary | ICD-10-CM | POA: Diagnosis not present

## 2023-06-14 DIAGNOSIS — S60211A Contusion of right wrist, initial encounter: Secondary | ICD-10-CM | POA: Diagnosis not present

## 2023-06-14 DIAGNOSIS — W19XXXA Unspecified fall, initial encounter: Secondary | ICD-10-CM

## 2023-06-14 DIAGNOSIS — W06XXXA Fall from bed, initial encounter: Secondary | ICD-10-CM | POA: Insufficient documentation

## 2023-06-14 DIAGNOSIS — S50311A Abrasion of right elbow, initial encounter: Secondary | ICD-10-CM | POA: Insufficient documentation

## 2023-06-14 DIAGNOSIS — M25531 Pain in right wrist: Secondary | ICD-10-CM | POA: Diagnosis not present

## 2023-06-14 DIAGNOSIS — S59901A Unspecified injury of right elbow, initial encounter: Secondary | ICD-10-CM | POA: Diagnosis not present

## 2023-06-14 DIAGNOSIS — S638X1A Sprain of other part of right wrist and hand, initial encounter: Secondary | ICD-10-CM | POA: Diagnosis not present

## 2023-06-14 DIAGNOSIS — M25521 Pain in right elbow: Secondary | ICD-10-CM | POA: Diagnosis not present

## 2023-06-14 NOTE — Discharge Instructions (Signed)
1.  Elevate your wrist is much as possible and apply well wrapped ice pack.  You may get significant swelling and bruising due to being anticoagulated on Coumadin. 2.  Wear your wrist brace is much as possible to help with support.  May remove it for cleaning and resting when you are elevating and icing. 3.  You may leave the yellow gauze dressing in place over your elbow wound for the next 3 to 4 days.  You may change the outer dressing if needed.  Once you remove the yellow dressing, continue to apply an over-the-counter antibiotic ointment and clean dressing daily.

## 2023-06-14 NOTE — ED Provider Notes (Signed)
[facility] Provider Note   CSN: [id number] Arrival date & time: 06/14/23  1007     History  Chief Complaint  Patient presents with   Ladona Mow Nekeisha Radle is a 82 y.o. female.  HPI Patient fell at about 2 AM.  She got out of the bed to go to the bathroom.  Patient's husband reports that she always requires assistance for balance.  Patient's husband reports that she did not awaken him and he did not hear her get out of bed.  He reports she then fell.  However, she only injured her wrist and elbow.  Patient did not strike her head.  She is anticoagulated on Coumadin.  She wears a rugby helmet routinely to protect her head in the event that she takes a fall.  However.  Her husband reports that last night she [place] did not hit her head and only the the wrist and elbow.  She has been up and ambulatory at her baseline.  She can bear weight on the hips and the knees without pain.  She her baseline for requiring assistance with gait and balance.  Patient denies she has any other areas of pain.  She only cites her wrist and elbow.    Home Medications Prior to Admission medications   Medication Sig Start Date End Date Taking? Authorizing Provider  acetaminophen (TYLENOL) 325 MG tablet Take 1-2 tablets (325-650 mg total) by mouth every 4 (four) hours as needed for mild pain. Patient taking differently: Take 500 mg by mouth every 4 (four) hours as needed for mild pain. 03/16/23   Setzer, Lynnell Jude, PA-C  alum & mag hydroxide-simeth (MAALOX PLUS) 400-400-40 MG/5ML suspension Take 15 mLs by mouth 3 (three) times daily after meals. 03/16/23   Setzer, Lynnell Jude, PA-C  Calcium Carbonate Antacid (MAALOX PO) 15 mL as needed Orally Three times a day w/ meals    [provider]  CALCIUM CITRATE PO Take 1 tablet by mouth 3 (three) times daily.    [provider]  carbidopa-levodopa (SINEMET IR) 25-100 MG tablet TAKE 2 TABLETS BY MOUTH THREE  TIMES DAILY 03/31/23   Tat, Octaviano Batty, DO  Cholecalciferol (VITAMIN D3) 2000 units TABS Take 2,000 Units by mouth daily.     [provider]  famotidine-calcium carbonate-magnesium hydroxide (PEPCID COMPLETE) 10-800-165 MG CHEW chewable tablet Chew 1 tablet by mouth daily as needed (heartburn).    [provider]  ferrous sulfate 325 (65 FE) MG tablet Take 1 tablet (325 mg total) by mouth every other day. Patient taking differently: Take 325 mg by mouth. 3 days per week 03/16/23   Setzer, Lynnell Jude, PA-C  fexofenadine (ALLEGRA) 180 MG tablet 1 tablet Swallow whole with water; do not take with fruit juices. Orally Once a day Patient not taking: Reported on 05/04/2023    [provider]  fexofenadine (ALLEGRA) 60 MG tablet Take 60 mg by mouth at bedtime.    [provider]  Fish Oil-Krill Oil (SCHIFF KRILL & FISH OIL BLEND) CAPS Take 1 capsule by mouth daily at 12 noon.    [provider]  furosemide (LASIX) 20 MG tablet 1 tablet Oral Once a day 06/13/23   Marinus Maw, MD  Iron, Ferrous Sulfate, 325 (65 Fe) MG TABS 1 tablet Orally on monday and Fridays only    [provider]  melatonin 5 MG TABS Take 5 mg by mouth at bedtime.  [provider]  Multiple Vitamin (MULTIVITAMIN) tablet Take 1 tablet by mouth daily at 12 noon.    [provider]  Multiple Vitamins-Minerals (PRESERVISION AREDS 2) CAPS Take 1 capsule by mouth 2 (two) times daily.    [provider]  naphazoline-glycerin (CLEAR EYES REDNESS) 0.012-0.25 % SOLN Place 1-2 drops into both eyes 4 (four) times daily as needed for eye irritation. 03/16/23   Setzer, Lynnell Jude, PA-C  OVER THE COUNTER MEDICATION Take 1 capsule by mouth daily. Neuriva    [provider]  pantoprazole (PROTONIX) 40 MG tablet Take 40 mg by mouth daily. 06/07/17   [provider]  Probiotic Product (PROBIOTIC PO) Take 1 capsule by mouth daily.    [provider]   simvastatin (ZOCOR) 20 MG tablet Take 20 mg by mouth every evening.    [provider]  tamsulosin (FLOMAX) 0.4 MG CAPS capsule 1 capsule Orally Once a day Patient not taking: Reported on 05/04/2023    [provider]  vitamin B-12 (CYANOCOBALAMIN) 1000 MCG tablet Take 1,000 mcg by mouth daily.    [provider]  warfarin (COUMADIN) 5 MG tablet 5 mg daily on Monday, Wednesday, Friday and Sunday 2.5 mg daily on Tuesday, Thursday and Saturday Patient taking differently: 2.5mg  Tues and Fri, 5 mg other days 03/16/23   Milinda Antis, PA-C      Allergies    Demerol [meperidine], Oxycontin [oxycodone], Adhesive [tape], Alendronate sodium, Cozaar [losartan], Fosamax [alendronate], Ivp dye [iodinated contrast media], Losartan potassium, Meperidine hcl, Oxycodone hcl, and Phenergan [promethazine]    Review of Systems   Review of Systems  Physical Exam Updated Vital Signs BP 132/69   Pulse (!) 59   Temp 98.6 F (37 C) (Oral)   Resp 16   Ht 4' 11.75" (1.518 m)   Wt 48.5 kg   SpO2 92%   BMI 21.07 kg/m  Physical Exam Constitutional:      Comments: Patient is alert.  Pleasant.  No acute distress.  Interactive.  HENT:     Head:     Comments: No facial trauma.  Patient is wearing her rugby helmet. Eyes:     Extraocular Movements: Extraocular movements intact.  Cardiovascular:     Comments: Rate regular.  Click of mechanical valve present. Pulmonary:     Comments: Breath sounds bilaterally clear.  Chest wall nontender to compression. Abdominal:     General: There is no distension.     Palpations: Abdomen is soft.  Musculoskeletal:     Comments: Right wrist has some ecchymotic bruising over the volar aspect.  Some focal tenderness over the dorsum of the hand at the base of the metacarpals.  Wrist is not deformed.  Range of motion slight tenderness but intact.  Radial pulse 2+ and strong.  Minor abrasions to the right elbow.  No effusions or deformity.  No large  hematomas.  Skin:    General: Skin is warm and dry.  Neurological:     Comments: Is alert.  She is cooperative and assisting with exam.     ED Results / Procedures / Treatments   Labs (all labs ordered are listed, but only abnormal results are displayed) Labs Reviewed - No data to display  EKG None  Radiology DG Elbow Complete Right  Result Date: 06/14/2023 CLINICAL DATA:  Pain after fall EXAM: RIGHT ELBOW - COMPLETE 4 VIEW COMPARISON:  None Available. FINDINGS: Osteopenia. No fracture or dislocation. Preserved joint spaces. Mild chondrocalcinosis. No joint effusion. IMPRESSION:  No acute osseous abnormality. Electronically Signed   By: Karen Kays M.D.   On: 06/14/2023 11:24   DG Wrist Complete Right  Result Date: 06/14/2023 CLINICAL DATA:  Fall with right wrist pain EXAM: RIGHT WRIST - COMPLETE 4 VIEW COMPARISON:  None Available. FINDINGS: There is no evidence of fracture or dislocation. Degenerative changes, most pronounced at the first carpometacarpal and ulnocarpal joints. Soft tissues are unremarkable. IMPRESSION: 1. No acute fracture or dislocation. 2. Degenerative changes of the wrist. Electronically Signed   By: Agustin Cree M.D.   On: 06/14/2023 11:24    Procedures Procedures    Medications Ordered in ED Medications - No data to display  ED Course/ Medical Decision Making/ A&P                             Medical Decision Making Amount and/or Complexity of Data Reviewed Radiology: ordered.   Baseline patient has poor gait and frequent falls.  She sustained a fall yesterday evening in the early hours of the morning.  At this time only injury by history and exam is right wrist and elbow.  I agree with x-ray imaging.  Time I do not feel the patient needs CT head imaging.  She and her unequivocally report there was no strike to the head and she has no associated headache or pain.  X-ray images for wrist and hand reviewed personally by myself and also radiology review.  No  acute fractures present.  On physical exam patient has mild to moderate ecchymosis at the dorsum of the hand and wrist.  At this time she does not have large swelling or hematoma.  She is anticoagulated.  This may subsequently occur.  I have counseled on icing and elevating is much as possible as well as wearing a wrist brace for sprain.  Patient and her husband voiced understanding.  Apically she does not take much for pain control.  She will take some extra strength Tylenol and has some tramadol from prior prescription at home if needed.  This time stable for discharge.        Final Clinical Impression(s) / ED Diagnoses Final diagnoses:  Fall, initial encounter  Sprain of right wrist, initial encounter  Abrasion of right elbow, initial encounter    Rx / DC Orders ED Discharge Orders     None         Arby Barrette, MD 06/14/23 1241

## 2023-06-14 NOTE — ED Triage Notes (Signed)
States got up around 0200 to go to restroom and lost balance and fell onto right arm. Skin tears to right upper forearm/elbow and right wrist swelling. Pulses intact.  Denies hitting head, on thinners.

## 2023-06-20 DIAGNOSIS — Z7901 Long term (current) use of anticoagulants: Secondary | ICD-10-CM | POA: Diagnosis not present

## 2023-06-20 DIAGNOSIS — D62 Acute posthemorrhagic anemia: Secondary | ICD-10-CM | POA: Diagnosis not present

## 2023-06-20 DIAGNOSIS — G231 Progressive supranuclear ophthalmoplegia [Steele-Richardson-Olszewski]: Secondary | ICD-10-CM | POA: Diagnosis not present

## 2023-06-20 DIAGNOSIS — Z952 Presence of prosthetic heart valve: Secondary | ICD-10-CM | POA: Diagnosis not present

## 2023-06-20 DIAGNOSIS — N182 Chronic kidney disease, stage 2 (mild): Secondary | ICD-10-CM | POA: Diagnosis not present

## 2023-06-20 DIAGNOSIS — Z954 Presence of other heart-valve replacement: Secondary | ICD-10-CM | POA: Diagnosis not present

## 2023-06-20 DIAGNOSIS — S42032D Displaced fracture of lateral end of left clavicle, subsequent encounter for fracture with routine healing: Secondary | ICD-10-CM | POA: Diagnosis not present

## 2023-06-20 DIAGNOSIS — I5022 Chronic systolic (congestive) heart failure: Secondary | ICD-10-CM | POA: Diagnosis not present

## 2023-06-20 DIAGNOSIS — I480 Paroxysmal atrial fibrillation: Secondary | ICD-10-CM | POA: Diagnosis not present

## 2023-06-20 DIAGNOSIS — G20C Parkinsonism, unspecified: Secondary | ICD-10-CM | POA: Diagnosis not present

## 2023-06-20 DIAGNOSIS — Z9181 History of falling: Secondary | ICD-10-CM | POA: Diagnosis not present

## 2023-06-20 DIAGNOSIS — D631 Anemia in chronic kidney disease: Secondary | ICD-10-CM | POA: Diagnosis not present

## 2023-06-20 DIAGNOSIS — S065XAD Traumatic subdural hemorrhage with loss of consciousness status unknown, subsequent encounter: Secondary | ICD-10-CM | POA: Diagnosis not present

## 2023-06-20 DIAGNOSIS — I130 Hypertensive heart and chronic kidney disease with heart failure and stage 1 through stage 4 chronic kidney disease, or unspecified chronic kidney disease: Secondary | ICD-10-CM | POA: Diagnosis not present

## 2023-06-28 ENCOUNTER — Other Ambulatory Visit: Payer: Self-pay | Admitting: Internal Medicine

## 2023-06-28 DIAGNOSIS — Z954 Presence of other heart-valve replacement: Secondary | ICD-10-CM | POA: Diagnosis not present

## 2023-06-28 DIAGNOSIS — Z9181 History of falling: Secondary | ICD-10-CM | POA: Diagnosis not present

## 2023-06-28 DIAGNOSIS — D631 Anemia in chronic kidney disease: Secondary | ICD-10-CM | POA: Diagnosis not present

## 2023-06-28 DIAGNOSIS — Z1231 Encounter for screening mammogram for malignant neoplasm of breast: Secondary | ICD-10-CM

## 2023-06-28 DIAGNOSIS — I5022 Chronic systolic (congestive) heart failure: Secondary | ICD-10-CM | POA: Diagnosis not present

## 2023-06-28 DIAGNOSIS — N182 Chronic kidney disease, stage 2 (mild): Secondary | ICD-10-CM | POA: Diagnosis not present

## 2023-06-28 DIAGNOSIS — I130 Hypertensive heart and chronic kidney disease with heart failure and stage 1 through stage 4 chronic kidney disease, or unspecified chronic kidney disease: Secondary | ICD-10-CM | POA: Diagnosis not present

## 2023-06-28 DIAGNOSIS — G231 Progressive supranuclear ophthalmoplegia [Steele-Richardson-Olszewski]: Secondary | ICD-10-CM | POA: Diagnosis not present

## 2023-06-28 DIAGNOSIS — Z952 Presence of prosthetic heart valve: Secondary | ICD-10-CM | POA: Diagnosis not present

## 2023-06-28 DIAGNOSIS — D62 Acute posthemorrhagic anemia: Secondary | ICD-10-CM | POA: Diagnosis not present

## 2023-06-28 DIAGNOSIS — G20C Parkinsonism, unspecified: Secondary | ICD-10-CM | POA: Diagnosis not present

## 2023-06-28 DIAGNOSIS — S42032D Displaced fracture of lateral end of left clavicle, subsequent encounter for fracture with routine healing: Secondary | ICD-10-CM | POA: Diagnosis not present

## 2023-06-28 DIAGNOSIS — Z7901 Long term (current) use of anticoagulants: Secondary | ICD-10-CM | POA: Diagnosis not present

## 2023-06-28 DIAGNOSIS — S065XAD Traumatic subdural hemorrhage with loss of consciousness status unknown, subsequent encounter: Secondary | ICD-10-CM | POA: Diagnosis not present

## 2023-06-28 DIAGNOSIS — I480 Paroxysmal atrial fibrillation: Secondary | ICD-10-CM | POA: Diagnosis not present

## 2023-07-05 DIAGNOSIS — Z952 Presence of prosthetic heart valve: Secondary | ICD-10-CM | POA: Diagnosis not present

## 2023-07-05 DIAGNOSIS — I480 Paroxysmal atrial fibrillation: Secondary | ICD-10-CM | POA: Diagnosis not present

## 2023-07-05 DIAGNOSIS — Z7901 Long term (current) use of anticoagulants: Secondary | ICD-10-CM | POA: Diagnosis not present

## 2023-07-06 DIAGNOSIS — G20C Parkinsonism, unspecified: Secondary | ICD-10-CM | POA: Diagnosis not present

## 2023-07-06 DIAGNOSIS — S065XAD Traumatic subdural hemorrhage with loss of consciousness status unknown, subsequent encounter: Secondary | ICD-10-CM | POA: Diagnosis not present

## 2023-07-06 DIAGNOSIS — Z7901 Long term (current) use of anticoagulants: Secondary | ICD-10-CM | POA: Diagnosis not present

## 2023-07-06 DIAGNOSIS — Z9181 History of falling: Secondary | ICD-10-CM | POA: Diagnosis not present

## 2023-07-06 DIAGNOSIS — D62 Acute posthemorrhagic anemia: Secondary | ICD-10-CM | POA: Diagnosis not present

## 2023-07-06 DIAGNOSIS — G231 Progressive supranuclear ophthalmoplegia [Steele-Richardson-Olszewski]: Secondary | ICD-10-CM | POA: Diagnosis not present

## 2023-07-06 DIAGNOSIS — D631 Anemia in chronic kidney disease: Secondary | ICD-10-CM | POA: Diagnosis not present

## 2023-07-06 DIAGNOSIS — I5022 Chronic systolic (congestive) heart failure: Secondary | ICD-10-CM | POA: Diagnosis not present

## 2023-07-06 DIAGNOSIS — S42032D Displaced fracture of lateral end of left clavicle, subsequent encounter for fracture with routine healing: Secondary | ICD-10-CM | POA: Diagnosis not present

## 2023-07-06 DIAGNOSIS — N182 Chronic kidney disease, stage 2 (mild): Secondary | ICD-10-CM | POA: Diagnosis not present

## 2023-07-06 DIAGNOSIS — Z954 Presence of other heart-valve replacement: Secondary | ICD-10-CM | POA: Diagnosis not present

## 2023-07-06 DIAGNOSIS — I130 Hypertensive heart and chronic kidney disease with heart failure and stage 1 through stage 4 chronic kidney disease, or unspecified chronic kidney disease: Secondary | ICD-10-CM | POA: Diagnosis not present

## 2023-07-06 DIAGNOSIS — Z952 Presence of prosthetic heart valve: Secondary | ICD-10-CM | POA: Diagnosis not present

## 2023-07-06 DIAGNOSIS — I480 Paroxysmal atrial fibrillation: Secondary | ICD-10-CM | POA: Diagnosis not present

## 2023-07-11 DIAGNOSIS — N182 Chronic kidney disease, stage 2 (mild): Secondary | ICD-10-CM | POA: Diagnosis not present

## 2023-07-11 DIAGNOSIS — Z9181 History of falling: Secondary | ICD-10-CM | POA: Diagnosis not present

## 2023-07-11 DIAGNOSIS — I5022 Chronic systolic (congestive) heart failure: Secondary | ICD-10-CM | POA: Diagnosis not present

## 2023-07-11 DIAGNOSIS — S42032D Displaced fracture of lateral end of left clavicle, subsequent encounter for fracture with routine healing: Secondary | ICD-10-CM | POA: Diagnosis not present

## 2023-07-11 DIAGNOSIS — G20C Parkinsonism, unspecified: Secondary | ICD-10-CM | POA: Diagnosis not present

## 2023-07-11 DIAGNOSIS — Z952 Presence of prosthetic heart valve: Secondary | ICD-10-CM | POA: Diagnosis not present

## 2023-07-11 DIAGNOSIS — I480 Paroxysmal atrial fibrillation: Secondary | ICD-10-CM | POA: Diagnosis not present

## 2023-07-11 DIAGNOSIS — I130 Hypertensive heart and chronic kidney disease with heart failure and stage 1 through stage 4 chronic kidney disease, or unspecified chronic kidney disease: Secondary | ICD-10-CM | POA: Diagnosis not present

## 2023-07-11 DIAGNOSIS — Z7901 Long term (current) use of anticoagulants: Secondary | ICD-10-CM | POA: Diagnosis not present

## 2023-07-11 DIAGNOSIS — S065XAD Traumatic subdural hemorrhage with loss of consciousness status unknown, subsequent encounter: Secondary | ICD-10-CM | POA: Diagnosis not present

## 2023-07-11 DIAGNOSIS — Z954 Presence of other heart-valve replacement: Secondary | ICD-10-CM | POA: Diagnosis not present

## 2023-07-11 DIAGNOSIS — D62 Acute posthemorrhagic anemia: Secondary | ICD-10-CM | POA: Diagnosis not present

## 2023-07-11 DIAGNOSIS — G231 Progressive supranuclear ophthalmoplegia [Steele-Richardson-Olszewski]: Secondary | ICD-10-CM | POA: Diagnosis not present

## 2023-07-11 DIAGNOSIS — D631 Anemia in chronic kidney disease: Secondary | ICD-10-CM | POA: Diagnosis not present

## 2023-07-20 DIAGNOSIS — Z7901 Long term (current) use of anticoagulants: Secondary | ICD-10-CM | POA: Diagnosis not present

## 2023-07-20 DIAGNOSIS — I480 Paroxysmal atrial fibrillation: Secondary | ICD-10-CM | POA: Diagnosis not present

## 2023-07-20 DIAGNOSIS — Z952 Presence of prosthetic heart valve: Secondary | ICD-10-CM | POA: Diagnosis not present

## 2023-08-02 ENCOUNTER — Ambulatory Visit: Admission: RE | Admit: 2023-08-02 | Payer: Medicare HMO | Source: Ambulatory Visit

## 2023-08-02 DIAGNOSIS — Z1231 Encounter for screening mammogram for malignant neoplasm of breast: Secondary | ICD-10-CM | POA: Diagnosis not present

## 2023-08-16 DIAGNOSIS — I480 Paroxysmal atrial fibrillation: Secondary | ICD-10-CM | POA: Diagnosis not present

## 2023-08-16 DIAGNOSIS — Z7901 Long term (current) use of anticoagulants: Secondary | ICD-10-CM | POA: Diagnosis not present

## 2023-08-16 DIAGNOSIS — Z952 Presence of prosthetic heart valve: Secondary | ICD-10-CM | POA: Diagnosis not present

## 2023-09-08 DIAGNOSIS — I480 Paroxysmal atrial fibrillation: Secondary | ICD-10-CM | POA: Diagnosis not present

## 2023-09-09 ENCOUNTER — Ambulatory Visit: Payer: Medicare HMO | Attending: Internal Medicine | Admitting: Internal Medicine

## 2023-09-09 VITALS — BP 142/80 | HR 70 | Ht 59.0 in | Wt 111.8 lb

## 2023-09-09 DIAGNOSIS — I480 Paroxysmal atrial fibrillation: Secondary | ICD-10-CM

## 2023-09-09 NOTE — Progress Notes (Addendum)
HPI [name] returns today for followup of her PAF, LBBB, s/p AVR, with a h/o LBBB, Parkinsons like syndrome/PSP, and HTN. In the interim, she appears to be maintaining NSR. She denies chest pain or sob. She had fallen and was in the hospital. She tried to get out of her car without help. She bled in her brain. Her amio was stopped. She denies symptoms of recurrent atrial fib.  Allergies  Allergen Reactions   Demerol [Meperidine] Shortness Of Breath and Swelling   Oxycontin [Oxycodone] Other (See Comments)    Hallucinations    Adhesive [Tape] Other (See Comments)    Redness and skin tears   Alendronate Sodium    Cozaar [Losartan] Other (See Comments)    Dizziness    Fosamax [Alendronate] Other (See Comments)    Heartburn    Ivp Dye [Iodinated Contrast Media] Hives    OK with benadryl pre-med (50mg  one hour before receiving iodinated contrast agent)   Losartan Potassium    Meperidine Hcl    Oxycodone Hcl    Phenergan [Promethazine] Other (See Comments)    Avoids due to reaction with current medications     Current Outpatient Medications  Medication Sig Dispense Refill   acetaminophen (TYLENOL) 325 MG tablet Take 1-2 tablets (325-650 mg total) by mouth every 4 (four) hours as needed for mild pain. (Patient taking differently: Take 500 mg by mouth every 4 (four) hours as needed for mild pain.)     CALCIUM CITRATE PO Take 1 tablet by mouth 3 (three) times daily.     carbidopa-levodopa (SINEMET IR) 25-100 MG tablet TAKE 2 TABLETS BY MOUTH THREE TIMES DAILY 540 tablet 0   Cholecalciferol (VITAMIN D3) 2000 units TABS Take 2,000 Units by mouth daily.      famotidine-calcium carbonate-magnesium hydroxide (PEPCID COMPLETE) 10-800-165 MG CHEW chewable tablet Chew 1 tablet by mouth daily as needed (heartburn).     ferrous sulfate 325 (65 FE) MG EC tablet Take 325 mg by mouth 3 (three) times a week. Mon Wed Fri     fexofenadine (ALLEGRA) 180 MG tablet Take 180 mg by mouth as needed.      Fish Oil-Krill Oil (SCHIFF KRILL & FISH OIL BLEND) CAPS Take 1 capsule by mouth daily at 12 noon.     furosemide (LASIX) 20 MG tablet 1 tablet Oral Once a day 90 tablet 1   melatonin 5 MG TABS Take 5 mg by mouth at bedtime.     Multiple Vitamin (MULTIVITAMIN) tablet Take 1 tablet by mouth daily at 12 noon.     Multiple Vitamins-Minerals (PRESERVISION AREDS 2) CAPS Take 1 capsule by mouth 2 (two) times daily.     naphazoline-glycerin (CLEAR EYES REDNESS) 0.012-0.25 % SOLN Place 1-2 drops into both eyes 4 (four) times daily as needed for eye irritation.     OVER THE COUNTER MEDICATION Take 1 capsule by mouth daily. Neuriva     pantoprazole (PROTONIX) 40 MG tablet Take 40 mg by mouth daily.     Probiotic Product (PROBIOTIC PO) Take 1 capsule by mouth daily.     simvastatin (ZOCOR) 20 MG tablet Take 20 mg by mouth every evening.     tamsulosin (FLOMAX) 0.4 MG CAPS capsule      vitamin B-12 (CYANOCOBALAMIN) 1000 MCG tablet Take 1,000 mcg by mouth daily.     warfarin (COUMADIN) 5 MG tablet 5 mg daily on Monday, Wednesday, Friday and Sunday 2.5 mg daily on Tuesday, Thursday and Saturday (Patient taking  differently: 2.5mg  Tues and Fri, 5 mg other days)     No current facility-administered medications for this visit.     Past Medical History:  Diagnosis Date   BPV (benign positional vertigo)    Chronic neck pain    C2-3 arthropathy   CKD (chronic kidney disease), stage II    Diverticulosis of colon    GERD (gastroesophageal reflux disease)    Hiatal hernia    History of breast cancer per pt no recurrence   dx 2004-- Left breast DCIS (ER/PR+, HER2 negative) s/p  partial masectomy w/ sln bx and  chemoradiotion (completed 2004)   History of herpes zoster    History of kidney stones    HTN (hypertension)    Hyperlipidemia    Irritable bowel syndrome    LBBB (left bundle branch block)    PAF (paroxysmal atrial fibrillation) (HCC)    a. CHA2DS2VASc = 4-->coumadin.   Parkinsonism     neurologist-  dr tat   PONV (postoperative nausea and vomiting)    S/P aortic valve replacement with prosthetic valve    a. 04/13/2006 s/p St Jude mechnical AVR for severe AS;  b. 09/2014 Echo: EF 40-45%, gr1 DD, mild AI/MR, triv TR/PR.   Squamous cell carcinoma of skin 04/06/2022   right nasal sidewall   SUI (stress urinary incontinence, female)    Symptomatic anemia    Venous varices      ROS:   All systems reviewed and negative except as noted in the HPI.   Past Surgical History:  Procedure Laterality Date   ANTERIOR AND POSTERIOR REPAIR  1990   cystocele/ rectocele   AORTIC VALVE REPLACEMENT  04/13/06   dr gerhardt   and Replacement Ascending Aorta (St. Jude mechanical prosthesis)   APPENDECTOMY  child 1950   BREAST EXCISIONAL BIOPSY Left    BREAST LUMPECTOMY Left 01/11/2003   malignant   BREAST SURGERY Left 2004   CARDIAC CATHETERIZATION  02-22-2001   dr [place]   minimal non-obstructive cad/  mild aortic stenosis and aortic root enlargement/  normal LVF, ef 65%   CARDIAC CATHETERIZATION  03-22-2006    dr [place]   no significant obstructive cad/  severe aortic stenosis and mild to moderate aortic root enlargement/  upper normal right heart pressures   CARDIOVASCULAR STRESS TEST  09-30-2011   dr [place]   lexiscan nuclear study w/ apical thinning  vs  small prior apical infarct w/ no significant ischemia/  hypokinesis of the distal septum and apex, ef 50%   CARPAL TUNNEL RELEASE Bilateral left 09-09-2004/  right 2007   CATARACT EXTRACTION W/ INTRAOCULAR LENS  IMPLANT, BILATERAL  2015   COLONOSCOPY N/A 10/03/2014   Procedure: COLONOSCOPY;  Surgeon: Iva Boop, MD;  Location: [facility] ENDOSCOPY;  Service: Endoscopy;  Laterality: N/A;   CYSTOSCOPY WITH RETROGRADE PYELOGRAM, URETEROSCOPY AND STENT PLACEMENT Right 09/17/2016   Procedure: CYSTOSCOPY WITH RIGHT RETROGRADE PYELOGRAM, RIGHT URETEROSCOPY AND STENT PLACEMENT LASER LITHOTRIPSY, RIGHT STENT PLACEMENT;  Surgeon: Crist Fat, MD;  Location: [facility];  Service: Urology;  Laterality: Right;   ESOPHAGEAL MANOMETRY N/A 08/12/2014   Procedure: ESOPHAGEAL MANOMETRY (EM);  Surgeon: Iva Boop, MD;  Location: [facility] ENDOSCOPY;  Service: Endoscopy;  Laterality: N/A;   ESOPHAGOGASTRODUODENOSCOPY N/A 10/03/2014   Procedure: ESOPHAGOGASTRODUODENOSCOPY (EGD);  Surgeon: Iva Boop, MD;  Location[facility] ENDOSCOPY;  Service: Endoscopy;  Laterality: N/A;   ESOPHAGOGASTRODUODENOSCOPY N/A 12/08/2018   Procedure: ESOPHAGOGASTRODUODENOSCOPY (EGD);  Surgeon: Sherrilyn Rist, MD;  Location: [facility] ENDOSCOPY;  Service: Gastroenterology;  Laterality: N/A;   EXTRACORPOREAL SHOCK WAVE LITHOTRIPSY  1997   FOOT SURGERY Right 2013   hammertoe x2   HOLMIUM LASER APPLICATION Right 09/17/2016   Procedure: HOLMIUM LASER APPLICATION;  Surgeon: Crist Fat, MD;  Location: [facility];  Service: Urology;  Laterality: Right;   PERCUTANEOUS NEPHROSTOLITHOTOMY  1993   STONE EXTRACTION WITH BASKET Right 09/17/2016   Procedure: STONE EXTRACTION WITH BASKET;  Surgeon: Crist Fat, MD;  Location: [facility];  Service: Urology;  Laterality: Right;   TOTAL ABDOMINAL HYSTERECTOMY  1971   w/ Bilateral Salpingoophorectomy   TRANSTHORACIC ECHOCARDIOGRAM  10/24/2014   dr [place]   hypokinesis of the basal and mid inferoseptal and inferior walls,  ef 40-45%,  grade 1 diastolic dysfunction/  mechanical bileaflet aortic valve noted w/ mild regur., peak grandient , valve area 1.35cm^2/  severe MV calcification without stenosis w/ mild regurg., peak gradient 80mmHg/  mild LAE/  poss. atrial mass (MRI showed benign lipomatous hypertrophy of atrial septum) Loretha Brasil PR/mild TR     Family History  Problem Relation Age of Onset   Heart disease Mother    Heart disease Father 71       CABG   Other Brother        AVR   Healthy Child      Social History   Socioeconomic History   Marital status:  Married    Spouse name: Not on file   Number of children: 3   Years of education: 11   Highest education level: 11th grade  Occupational History   Occupation: Chartered loss adjuster: OTHER    Comment: retired  Tobacco Use   Smoking status: Never   Smokeless tobacco: Never  Vaping Use   Vaping status: Never Used  Substance and Sexual Activity   Alcohol use: No    Alcohol/week: 0.0 standard drinks of alcohol   Drug use: No   Sexual activity: Not on file  Other Topics Concern   Not on file  Social History Narrative      1/2 -1 soda a day     Right handed   Two story home   lives with spouse   Social Determinants of Health   Financial Resource Strain: Not on file  Food Insecurity: Not on file  Transportation Needs: Not on file  Physical Activity: Not on file  Stress: Not on file  Social Connections: Not on file  Intimate Partner Violence: Not on file     BP (!) 142/80   Pulse 70   Ht 4\' 11"  (1.499 m)   Wt 111 lb 12.8 oz (50.7 kg)   SpO2 97%   BMI 22.58 kg/m   Physical Exam:  Well appearing NAD HEENT: Unremarkable Neck:  No JVD, no thyromegally Lymphatics:  No adenopathy Back:  No CVA tenderness Lungs:  Clear HEART:  Regular rate rhythm, no murmurs, no rubs, no clicks Abd:  soft, positive bowel sounds, no organomegally, no rebound, no guarding Ext:  2 plus pulses, no edema, no cyanosis, no clubbing Skin:  No rashes no nodules Neuro:  CN II through XII intact, motor grossly intact  EKG - atrial flutter with ventricular pacing  DEVICE  Normal device function.  See PaceArt for details.   Assess/Plan:  1. PAF - she is now in flutter since stopping the  amiodarone. She will continue with her current meds. Dofetilide would be a consideration if she becomes symptomatic.  2. HTN - her blood pressure log has been reviewed and she is in the 120-130 mostly.  3. Coags - she will continue warfarin. She may need a little more with a reduction in amio dosing. She  will follow up with Dr. Jarold Motto. 4. LV dysfunction and LBBB - she is asymptomatic. I discussed the warning symptoms and if she is not passing out or having CHF, no indication for a biv PPM.  Dorathy Daft

## 2023-09-09 NOTE — Patient Instructions (Signed)

## 2023-09-13 DIAGNOSIS — Z7901 Long term (current) use of anticoagulants: Secondary | ICD-10-CM | POA: Diagnosis not present

## 2023-09-13 DIAGNOSIS — I480 Paroxysmal atrial fibrillation: Secondary | ICD-10-CM | POA: Diagnosis not present

## 2023-09-13 DIAGNOSIS — Z23 Encounter for immunization: Secondary | ICD-10-CM | POA: Diagnosis not present

## 2023-09-13 DIAGNOSIS — Z952 Presence of prosthetic heart valve: Secondary | ICD-10-CM | POA: Diagnosis not present

## 2023-09-30 NOTE — Progress Notes (Unsigned)
Assessment/Plan:   1.  PSP  -Patient understands differences between PSP and Parkinson's disease.  Understands that there is an increased risk of falls, aspiration as subsequent morbidity and mortality from this disease compared to Parkinson's disease.    -Do not want her walking at all.  She needs to use motorized wheelchair at all times, unless husband is directly holding her.  Have discussed this last many visits.  -long discussion about whether or not to continue carbidopa/levodopa 25/100, 2 po tid.  They ultimately decided to continue it.     2.  Neck pain with cervical dystonia  -This is actually improved with levodopa therapy.  -Her last MRI was in 2017 with evidence of severe C2/C3 left facet arthropathy with joint effusion and bone marrow edema.  She received injections at the time and pain improved.  3.  Insomnia  -can increase melatonin to 8-10 mg at bed  -discussed adding other meds but declined for now.  4.  A-fib  -on coumadin.  Cardiologist said that Coumadin could not be stopped because she had a mechanical valve, but he did recommend lower target range of INR.  However, he said PCP is monitoring/managing that.  5.  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  My previous records were reviewed prior to todays visit as well as outside records available to me.  This patient is accompanied in the office by her spouse who supplements the history.  I have talked to the patient and her husband for a long time about the very significant risk that she has for subdural hematoma if she continue to walk.  I told her I did not want her walking at all.  Unfortunately, my worries had come to fruition not long after our last visit.  She was admitted in March, 2024 for traumatic subdural hematoma and small amount of petechial hemorrhage in the right frontal region.  She sustained a left clavicle fracture at the  time of the fall as well.  She did go to inpatient rehab following the event.  She was told at discharge to not walk unless somebody is right with her.  Unfortunately, she was back in the emergency room June 18 after a fall in the middle of the night.  She got out of the bed to use the bathroom.  She did not awaken her husband and fell, but did not hit her head this time.  Current prescribed movement disorder medications: Carbidopa/levodopa 25/100, 2 tablets 3 times per day    ALLERGIES:   Allergies  Allergen Reactions   Demerol [Meperidine] Shortness Of Breath and Swelling   Oxycontin [Oxycodone] Other (See Comments)    Hallucinations    Adhesive [Tape] Other (See Comments)    Redness and skin tears   Alendronate Sodium    Cozaar [Losartan] Other (See Comments)    Dizziness    Fosamax [Alendronate] Other (See Comments)    Heartburn    Ivp Dye [Iodinated Contrast Media] Hives    OK with benadryl pre-med (50mg  one hour before receiving iodinated contrast agent)   Losartan Potassium    Meperidine Hcl    Oxycodone Hcl    Phenergan [Promethazine] Other (See Comments)    Avoids due to reaction with current medications    CURRENT MEDICATIONS:  Outpatient Encounter Medications as of 10/04/2023  Medication Sig   acetaminophen (TYLENOL) 325 MG tablet Take 1-2 tablets (325-650  mg total) by mouth every 4 (four) hours as needed for mild pain. (Patient taking differently: Take 500 mg by mouth every 4 (four) hours as needed for mild pain.)   CALCIUM CITRATE PO Take 1 tablet by mouth 3 (three) times daily.   carbidopa-levodopa (SINEMET IR) 25-100 MG tablet TAKE 2 TABLETS BY MOUTH THREE TIMES DAILY   Cholecalciferol (VITAMIN D3) 2000 units TABS Take 2,000 Units by mouth daily.    famotidine-calcium carbonate-magnesium hydroxide (PEPCID COMPLETE) 10-800-165 MG CHEW chewable tablet Chew 1 tablet by mouth daily as needed (heartburn).   ferrous sulfate 325 (65 FE) MG EC tablet Take 325 mg by mouth 3  (three) times a week. Mon Wed Fri   fexofenadine (ALLEGRA) 180 MG tablet Take 180 mg by mouth as needed.   Fish Oil-Krill Oil (SCHIFF KRILL & FISH OIL BLEND) CAPS Take 1 capsule by mouth daily at 12 noon.   furosemide (LASIX) 20 MG tablet 1 tablet Oral Once a day   melatonin 5 MG TABS Take 5 mg by mouth at bedtime.   Multiple Vitamin (MULTIVITAMIN) tablet Take 1 tablet by mouth daily at 12 noon.   Multiple Vitamins-Minerals (PRESERVISION AREDS 2) CAPS Take 1 capsule by mouth 2 (two) times daily.   naphazoline-glycerin (CLEAR EYES REDNESS) 0.012-0.25 % SOLN Place 1-2 drops into both eyes 4 (four) times daily as needed for eye irritation.   OVER THE COUNTER MEDICATION Take 1 capsule by mouth daily. Neuriva   pantoprazole (PROTONIX) 40 MG tablet Take 40 mg by mouth daily.   Probiotic Product (PROBIOTIC PO) Take 1 capsule by mouth daily.   simvastatin (ZOCOR) 20 MG tablet Take 20 mg by mouth every evening.   tamsulosin (FLOMAX) 0.4 MG CAPS capsule    vitamin B-12 (CYANOCOBALAMIN) 1000 MCG tablet Take 1,000 mcg by mouth daily.   warfarin (COUMADIN) 5 MG tablet 5 mg daily on Monday, Wednesday, Friday and Sunday 2.5 mg daily on Tuesday, Thursday and Saturday (Patient taking differently: 2.5mg  Tues and Fri, 5 mg other days)   No facility-administered encounter medications on file as of 10/04/2023.    Objective:   PHYSICAL EXAMINATION:    VITALS:   There were no vitals filed for this visit.     GEN:  The patient appears stated age and is in NAD. HEENT:  Normocephalic, AT.   Pt wearing helmet.  The mucous membranes are moist. The superficial temporal arteries are without ropiness or tenderness.  She is wearing a soft helmet.   Neurological examination:  Orientation: The patient is alert and oriented x3. Cranial nerves: There is good facial symmetry with facial hypomimia.  The speech is fluent and clear. Soft palate rises symmetrically and there is no tongue deviation. Hearing is intact to  conversational tone. Sensation: Sensation is intact to light touch throughout Motor: Strength is at least antigravity x4.  Movement examination: Tone: There is normal tone in the UE/LE Abnormal movements: frequent myoclonic jerks Coordination:  There is no decremation with RAM's, but she is slow Gait and Station: her husband holds her to ambulate and she is short stepped and holds the cane.  She shuffles.  I have reviewed and interpreted the following labs independently    Chemistry      Component Value Date/Time   NA 134 (L) 03/14/2023 0725   NA 141 08/10/2022 0917   K 4.0 03/14/2023 0725   CL 97 (L) 03/14/2023 0725   CO2 28 03/14/2023 0725   BUN 39 (H) 03/14/2023 0725  BUN 29 (H) 08/10/2022 0917   CREATININE 1.67 (H) 03/14/2023 0725      Component Value Date/Time   CALCIUM 9.0 03/14/2023 0725   ALKPHOS 59 03/09/2023 0604   AST 15 03/09/2023 0604   ALT 7 03/09/2023 0604   BILITOT 0.3 03/09/2023 0604   BILITOT 0.3 08/10/2022 0917       Lab Results  Component Value Date   WBC 7.3 03/16/2023   HGB 7.8 (L) 03/16/2023   HCT 23.9 (L) 03/16/2023   MCV 101.3 (H) 03/16/2023   PLT 271 03/16/2023    Lab Results  Component Value Date   TSH 3.520 08/10/2022     Total time spent on today's visit was *** minutes, including both face-to-face time and nonface-to-face time.  Time included that spent on review of records (prior notes available to me/labs/imaging if pertinent), discussing treatment and goals, answering patient's questions and coordinating care.  Cc:  Garlan Fillers, MD

## 2023-10-04 ENCOUNTER — Ambulatory Visit: Payer: Medicare HMO | Admitting: Neurology

## 2023-10-04 ENCOUNTER — Encounter: Payer: Self-pay | Admitting: Neurology

## 2023-10-04 VITALS — BP 118/70 | HR 68 | Wt 110.6 lb

## 2023-10-04 DIAGNOSIS — G231 Progressive supranuclear ophthalmoplegia [Steele-Richardson-Olszewski]: Secondary | ICD-10-CM | POA: Diagnosis not present

## 2023-10-04 DIAGNOSIS — R482 Apraxia: Secondary | ICD-10-CM

## 2023-10-04 NOTE — Patient Instructions (Signed)
It was really good to see you!  Stay seated!  The physicians and staff at [facility] are committed to providing excellent care. You may receive a survey requesting feedback about your experience at our office. We strive to receive "very good" responses to the survey questions. If you feel that your experience would prevent you from giving the office a "very good " response, please contact our office to try to remedy the situation. We may be reached at [phone]. Thank you for taking the time out of your busy day to complete the survey.

## 2023-10-11 DIAGNOSIS — I480 Paroxysmal atrial fibrillation: Secondary | ICD-10-CM | POA: Diagnosis not present

## 2023-10-11 DIAGNOSIS — I358 Other nonrheumatic aortic valve disorders: Secondary | ICD-10-CM | POA: Diagnosis not present

## 2023-10-11 DIAGNOSIS — Z952 Presence of prosthetic heart valve: Secondary | ICD-10-CM | POA: Diagnosis not present

## 2023-10-11 DIAGNOSIS — Z7901 Long term (current) use of anticoagulants: Secondary | ICD-10-CM | POA: Diagnosis not present

## 2023-11-02 ENCOUNTER — Encounter: Payer: Medicare HMO | Attending: Physical Medicine and Rehabilitation | Admitting: Physical Medicine and Rehabilitation

## 2023-11-02 VITALS — BP 140/73 | HR 87 | Ht 59.0 in | Wt 113.0 lb

## 2023-11-02 DIAGNOSIS — M542 Cervicalgia: Secondary | ICD-10-CM | POA: Insufficient documentation

## 2023-11-02 DIAGNOSIS — S065X0D Traumatic subdural hemorrhage without loss of consciousness, subsequent encounter: Secondary | ICD-10-CM | POA: Insufficient documentation

## 2023-11-02 DIAGNOSIS — G231 Progressive supranuclear ophthalmoplegia [Steele-Richardson-Olszewski]: Secondary | ICD-10-CM | POA: Insufficient documentation

## 2023-11-02 DIAGNOSIS — R269 Unspecified abnormalities of gait and mobility: Secondary | ICD-10-CM | POA: Insufficient documentation

## 2023-11-02 NOTE — Progress Notes (Signed)
Subjective:    Patient ID: [name], female    DOB: [birth year], 82 y.o.   MRN: [id number]  HPI  [name] is a 82 y.o. year old female  who  has a past medical history of BPV (benign positional vertigo), Chronic neck pain, CKD (chronic kidney disease), stage II, Diverticulosis of colon, GERD (gastroesophageal reflux disease), Hiatal hernia, History of breast cancer (per pt no recurrence), History of herpes zoster, History of kidney stones, HTN (hypertension), Hyperlipidemia, Irritable bowel syndrome, LBBB (left bundle branch block), PAF (paroxysmal atrial fibrillation) (HCC), Parkinsonism (HCC), PONV (postoperative nausea and vomiting), S/P aortic valve replacement with prosthetic valve, Squamous cell carcinoma of skin (04/06/2022), SUI (stress urinary incontinence, female), Symptomatic anemia, and Venous varices ( ).   They are presenting to PM&R clinic as a follow up for treatment of TBI, c/b PSP  Plan from last visit: Traumatic subdural hematoma without loss of consciousness, subsequent encounter Follow up with me in 6 months or earlier if needed Continue following with Neurology for PSP Has been discharged from neurosurgery;    Fracture of unspecified part of left clavicle, initial encounter for closed fracture Discharged from Dr. Magdalene Patricia office Stop robaxin and tramadol; can continue with tylenol Prn for pain   Abnormality of gait and mobility Show OT your gait with walker vs. Husband only with gait belt for support and get input on stability; if need to re-integrate the walker.    Would continue use of gait belt and helmet for safety   Dry Eyes Ask your ophthalmologist about adding eye drops/gel at nighttime; can continue Clear Eyes Redness as needed occasionally   Left Hammertoe Look for wide toebox shoes to accommodate your left hammertoe I have sent an orthopedics referral for consideration of hammer toe surgery   Interval Hx:  - Therapies:  None  currently. Husband feels confident with gait belt for mobility. They are interested in a motorized foot pedal to help maintain mobility. She has a cycle at home but cannot push it hard enough to activate it. It is a stationary bike with a complete back for support.    - Follow ups: Per neurology, she cannot be cleared for independent ambulation due to progression of PSP.    - Falls: Er visit 6[place]18 due to falling at polls; since then she has not been getting up on her own and including nighttime going to the bathroom is getting assistance.    - WUJ:WJXBJYN states she uses a walker occassionally for exercise only, but overall just uses the gait belt. Have occassionally used a standard wheelchair for long distances and doctor's appointments; they usually opt to push her. They own an Mining engineer wheelchair 3 insurance bought years prior "but it has not proven to be helpful in the house." She has to go very slowly with it but can independently and does use occasionally ex. for making cookies with the family. It does not have a lift function.    - Medications: Have been working on weaning levodopa. Next week she will be down to 1 tablet, then off entirely. She feels better overall; her head feels clearer and she is sleeping better since reducing the medication.    - Other concerns: "We hope for graduation today!"  Pain Inventory Average Pain 1 Pain Right Now 0 My pain is dull  In the last 24 hours, has pain interfered with the following? General activity 0 Relation with others 0 Enjoyment of life 0 What TIME of day is  your pain at its worst? daytime Sleep (in general) Good  Pain is worse with: some activites Pain improves with: rest Relief from Meds:  .  Family History  Problem Relation Age of Onset   Heart disease Mother    Heart disease Father 24       CABG   Other Brother        AVR   Healthy Child    Social History   Socioeconomic History   Marital status: Married    Spouse name:  Not on file   Number of children: 3   Years of education: 11   Highest education level: 11th grade  Occupational History   Occupation: Chartered loss adjuster: [employer]    Comment: retired  Tobacco Use   Smoking status: Never   Smokeless tobacco: Never  Vaping Use   Vaping status: Never Used  Substance and Sexual Activity   Alcohol use: No    Alcohol/week: 0.0 standard drinks of alcohol   Drug use: No   Sexual activity: Not on file  Other Topics Concern   Not on file  Social History Narrative      1/2 -1 soda a day     Right handed   Two story home   lives with spouse   Social Determinants of Health   Financial Resource Strain: Not on file  Food Insecurity: Not on file  Transportation Needs: Not on file  Physical Activity: Not on file  Stress: Not on file  Social Connections: Not on file   Past Surgical History:  Procedure Laterality Date   ANTERIOR AND POSTERIOR REPAIR  1990   cystocele/ rectocele   AORTIC VALVE REPLACEMENT  04/13/06   dr gerhardt   and Replacement Ascending Aorta (St. Jude mechanical prosthesis)   APPENDECTOMY  child 1950   BREAST EXCISIONAL BIOPSY Left    BREAST LUMPECTOMY Left 01/11/2003   malignant   BREAST SURGERY Left 2004   CARDIAC CATHETERIZATION  02-22-2001   dr [place]   minimal non-obstructive cad/  mild aortic stenosis and aortic root enlargement/  normal LVF, ef 65%   CARDIAC CATHETERIZATION  03-22-2006    dr [place]   no significant obstructive cad/  severe aortic stenosis and mild to moderate aortic root enlargement/  upper normal right heart pressures   CARDIOVASCULAR STRESS TEST  09-30-2011   dr [place]   lexiscan nuclear study w/ apical thinning  vs  small prior apical infarct w/ no significant ischemia/  hypokinesis of the distal septum and apex, ef 50%   CARPAL TUNNEL RELEASE Bilateral left 09-09-2004/  right 2007   CATARACT EXTRACTION W/ INTRAOCULAR LENS  IMPLANT, BILATERAL  2015   COLONOSCOPY N/A 10/03/2014   Procedure:  COLONOSCOPY;  Surgeon: Iva Boop, MD;  Location: [facility] ENDOSCOPY;  Service: Endoscopy;  Laterality: N/A;   CYSTOSCOPY WITH RETROGRADE PYELOGRAM, URETEROSCOPY AND STENT PLACEMENT Right 09/17/2016   Procedure: CYSTOSCOPY WITH RIGHT RETROGRADE PYELOGRAM, RIGHT URETEROSCOPY AND STENT PLACEMENT LASER LITHOTRIPSY, RIGHT STENT PLACEMENT;  Surgeon: Crist Fat, MD;  Location: [facility];  Service: Urology;  Laterality: Right;   ESOPHAGEAL MANOMETRY N/A 08/12/2014   Procedure: ESOPHAGEAL MANOMETRY (EM);  Surgeon: Iva Boop, MD;  Location: [facility] ENDOSCOPY;  Service: Endoscopy;  Laterality: N/A;   ESOPHAGOGASTRODUODENOSCOPY N/A 10/03/2014   Procedure: ESOPHAGOGASTRODUODENOSCOPY (EGD);  Surgeon: Iva Boop, MD;  Location: [facility] ENDOSCOPY;  Service: Endoscopy;  Laterality: N/A;   ESOPHAGOGASTRODUODENOSCOPY N/A 12/08/2018   Procedure: ESOPHAGOGASTRODUODENOSCOPY (EGD);  Surgeon: Myrtie Neither,  Andreas Blower, MD;  Location: [facility] ENDOSCOPY;  Service: Gastroenterology;  Laterality: N/A;   EXTRACORPOREAL SHOCK WAVE LITHOTRIPSY  1997   FOOT SURGERY Right 2013   hammertoe x2   HOLMIUM LASER APPLICATION Right 09/17/2016   Procedure: HOLMIUM LASER APPLICATION;  Surgeon: Crist Fat, MD;  [facility];  Service: Urology;  Laterality: Right;   PERCUTANEOUS NEPHROSTOLITHOTOMY  1993   STONE EXTRACTION WITH BASKET Right 09/17/2016   Procedure: STONE EXTRACTION WITH BASKET;  Surgeon: Crist Fat, MD;  Location: [facility];  Service: Urology;  Laterality: Right;   TOTAL ABDOMINAL HYSTERECTOMY  1971   w/ Bilateral Salpingoophorectomy   TRANSTHORACIC ECHOCARDIOGRAM  10/24/2014   dr [place]   hypokinesis of the basal and mid inferoseptal and inferior walls,  ef 40-45%,  grade 1 diastolic dysfunction/  mechanical bileaflet aortic valve noted w/ mild regur., peak grandient , valve area 1.35cm^2/  severe MV calcification without stenosis w/ mild regurg., peak  gradient 24mmHg/  mild LAE/  poss. atrial mass (MRI showed benign lipomatous hypertrophy of atrial septum) Loretha Brasil PR/mild TR   Past Surgical History:  Procedure Laterality Date   ANTERIOR AND POSTERIOR REPAIR  1990   cystocele/ rectocele   AORTIC VALVE REPLACEMENT  04/13/06   dr gerhardt   and Replacement Ascending Aorta (St. Jude mechanical prosthesis)   APPENDECTOMY  child 1950   BREAST EXCISIONAL BIOPSY Left    BREAST LUMPECTOMY Left 01/11/2003   malignant   BREAST SURGERY Left 2004   CARDIAC CATHETERIZATION  02-22-2001   dr [place]   minimal non-obstructive cad/  mild aortic stenosis and aortic root enlargement/  normal LVF, ef 65%   CARDIAC CATHETERIZATION  03-22-2006    dr [place]   no significant obstructive cad/  severe aortic stenosis and mild to moderate aortic root enlargement/  upper normal right heart pressures   CARDIOVASCULAR STRESS TEST  09-30-2011   dr [place]   lexiscan nuclear study w/ apical thinning  vs  small prior apical infarct w/ no significant ischemia/  hypokinesis of the distal septum and apex, ef 50%   CARPAL TUNNEL RELEASE Bilateral left 09-09-2004/  right 2007   CATARACT EXTRACTION W/ INTRAOCULAR LENS  IMPLANT, BILATERAL  2015   COLONOSCOPY N/A 10/03/2014   Procedure: COLONOSCOPY;  Surgeon: Iva Boop, MD;  Location: [facility] ENDOSCOPY;  Service: Endoscopy;  Laterality: N/A;   CYSTOSCOPY WITH RETROGRADE PYELOGRAM, URETEROSCOPY AND STENT PLACEMENT Right 09/17/2016   Procedure: CYSTOSCOPY WITH RIGHT RETROGRADE PYELOGRAM, RIGHT URETEROSCOPY AND STENT PLACEMENT LASER LITHOTRIPSY, RIGHT STENT PLACEMENT;  Surgeon: Crist Fat, MD;  Location: [facility];  Service: Urology;  Laterality: Right;   ESOPHAGEAL MANOMETRY N/A 08/12/2014   Procedure: ESOPHAGEAL MANOMETRY (EM);  Surgeon: Iva Boop, MD;  Location: [facility] ENDOSCOPY;  Service: Endoscopy;  Laterality: N/A;   ESOPHAGOGASTRODUODENOSCOPY N/A 10/03/2014   Procedure: ESOPHAGOGASTRODUODENOSCOPY (EGD);   Surgeon: Iva Boop, MD;  Location: [facility] ENDOSCOPY;  Service: Endoscopy;  Laterality: N/A;   ESOPHAGOGASTRODUODENOSCOPY N/A 12/08/2018   Procedure: ESOPHAGOGASTRODUODENOSCOPY (EGD);  Surgeon: Sherrilyn Rist, MD;  Location: [facility] ENDOSCOPY;  Service: Gastroenterology;  Laterality: N/A;   EXTRACORPOREAL SHOCK WAVE LITHOTRIPSY  1997   FOOT SURGERY Right 2013   hammertoe x2   HOLMIUM LASER APPLICATION Right 09/17/2016   Procedure: HOLMIUM LASER APPLICATION;  Surgeon: Crist Fat, MD;  Location: [facility];  Service: Urology;  Laterality: Right;   PERCUTANEOUS NEPHROSTOLITHOTOMY  1993   STONE EXTRACTION WITH  BASKET Right 09/17/2016   Procedure: STONE EXTRACTION WITH BASKET;  Surgeon: Crist Fat, MD;  Location: [facility];  Service: Urology;  Laterality: Right;   TOTAL ABDOMINAL HYSTERECTOMY  1971   w/ Bilateral Salpingoophorectomy   TRANSTHORACIC ECHOCARDIOGRAM  10/24/2014   dr [place]   hypokinesis of the basal and mid inferoseptal and inferior walls,  ef 40-45%,  grade 1 diastolic dysfunction/  mechanical bileaflet aortic valve noted w/ mild regur., peak grandient , valve area 1.35cm^2/  severe MV calcification without stenosis w/ mild regurg., peak gradient 21mmHg/  mild LAE/  poss. atrial mass (MRI showed benign lipomatous hypertrophy of atrial septum) Loretha Brasil PR/mild TR   Past Medical History:  Diagnosis Date   BPV (benign positional vertigo)    Chronic neck pain    C2-3 arthropathy   CKD (chronic kidney disease), stage II    Diverticulosis of colon    GERD (gastroesophageal reflux disease)    Hiatal hernia    History of breast cancer per pt no recurrence   dx 2004-- Left breast DCIS (ER/PR+, HER2 negative) s/p  partial masectomy w/ sln bx and  chemoradiotion (completed 2004)   History of herpes zoster    History of kidney stones    HTN (hypertension)    Hyperlipidemia    Irritable bowel syndrome    LBBB (left bundle branch block)     PAF (paroxysmal atrial fibrillation) (HCC)    a. CHA2DS2VASc = 4-->coumadin.   Parkinsonism [facility])    neurologist-  dr tat   PONV (postoperative nausea and vomiting)    S/P aortic valve replacement with prosthetic valve    a. 04/13/2006 s/p St Jude mechnical AVR for severe AS;  b. 09/2014 Echo: EF 40-45%, gr1 DD, mild AI/MR, triv TR/PR.   Squamous cell carcinoma of skin 04/06/2022   right nasal sidewall   SUI (stress urinary incontinence, female)    Symptomatic anemia    Venous varices     BP (!) 140/73   Pulse 87   Ht 4\' 11"  (1.499 m)   Wt 113 lb (51.3 kg)   SpO2 94%   BMI 22.82 kg/m   Opioid Risk Score:   Fall Risk Score:  `1  Depression screen [facility] 2/9     05/04/2023    9:21 AM 03/30/2023   10:28 AM  Depression screen PHQ 2/9  Decreased Interest 0 0  Down, Depressed, Hopeless 0 0  PHQ - 2 Score 0 0  Altered sleeping  0  Tired, decreased energy  0  Change in appetite  0  Feeling bad or failure about yourself   0  Trouble concentrating  0  Moving slowly or fidgety/restless  3  Suicidal thoughts  0  PHQ-9 Score  3  Difficult doing work/chores  Not difficult at all     Review of Systems  Musculoskeletal:  Positive for neck pain.  All other systems reviewed and are negative.     Objective:   Physical Exam   PE: Constitution: Appropriate appearance for age. No apparent distress +Helmet Resp: No respiratory distress. No accessory muscle usage. on RA and CTAB Cardio: Well perfused appearance. No peripheral edema. Abdomen: Nondistended. Nontender.   Psych: Appropriate mood and affect. Neuro: AAOx4. No apparent cognitive deficits   Neurologic Exam:   DTRs: Reflexes were 2+ in bilateral achilles, patella, biceps, BR and triceps. Babinsky: flexor responses b/l.   Hoffmans: negative b/l Sensory exam: revealed normal sensation in all dermatomal regions in bilateral upper extremities and  bilateral lower extremities Motor exam: strength 5/5 throughout bilateral upper  extremities and bilateral lower extremities Coordination: Fine motor coordination was normal.  +mild BL UE tremor.  Gait: With gait belt and huband holding belt and right hand; left leaning and some reduced clearance on L but overall stable pattern  MSK: Mild TTP L neck/shoulder Positioning head sidebend to left     Assessment & Plan:  [name] is a 82 y.o. year old female  who  has a past medical history of BPV (benign positional vertigo), Chronic neck pain, CKD (chronic kidney disease), stage II, Diverticulosis of colon, GERD (gastroesophageal reflux disease), Hiatal hernia, History of breast cancer (per pt no recurrence), History of herpes zoster, History of kidney stones, HTN (hypertension), Hyperlipidemia, Irritable bowel syndrome, LBBB (left bundle branch block), PAF (paroxysmal atrial fibrillation) (HCC), Parkinsonism (HCC), PONV (postoperative nausea and vomiting), S/P aortic valve replacement with prosthetic valve, Squamous cell carcinoma of skin (04/06/2022), SUI (stress urinary incontinence, female), Symptomatic anemia, and Venous varices ( ).   They are presenting to PM&R clinic as a follow up for treatment of TBI, c/b PSP.  Traumatic subdural hematoma without loss of consciousness, subsequent encounter To maintain mobility, strength, and prevent worsening weakness and falls, I recommend at least twice a week exercise regimen.  This can include either your home standing cycle with supervision, or ideally a chair based adult yoga or elderly fitness class.  You can look into programs with the [facility], [employer], or other local gyms for these classes.  I do agree that it would be a good idea to stand at her side during at least the first few classes to make sure that she does not have any instability.  PSP (progressive supranuclear palsy) (HCC)  Today, we discussed that she is okay to continue walking with assistance with a gait belt, and  recommend integrating use of rolling walker and wheelchair proactively when you notice increasing unsteadiness or concerned regarding falls.  We do anticipate that her mobility will gradually decrease over time.  Abnormality of gait and mobility  Patient has a electric wheelchair which was about for insurance 3 years prior, but she cannot use in the home due to wide wheeled face and difficulty with hand controls.  We discussed how, at the 5-year mark, we may be able to do a repeated wheelchair evaluation and make adjustments or change devices through insurance.  If you get close to the 5-year mark and are interested in this, you may return to our office for wheelchair evaluation.  Follow-up as needed.  Neck pain You can use Voltaren gel over-the-counter up to 4 times daily for neck and shoulder pain

## 2023-11-02 NOTE — Patient Instructions (Addendum)
You can use Voltaren gel over-the-counter up to 4 times daily for neck and shoulder pain  To maintain mobility, strength, and prevent worsening weakness and falls, I recommend at least twice a week exercise regimen.  This can include either your home standing cycle with supervision, or ideally a chair based adult yoga or elderly fitness class.  You can look into programs with the [facility], [employer], or other local gyms for these classes.  I do agree that it would be a good idea to stand at her side during at least the first few classes to make sure that she does not have any instability.  Today, we discussed that she is okay to continue walking with assistance with a gait belt, and recommend integrating use of rolling walker and wheelchair proactively when you notice increasing unsteadiness or concerned regarding falls.  We do anticipate that her mobility will gradually decrease over time.  Patient has a Mining engineer wheelchair which was about for insurance 3 years prior, but she cannot use in the home due to wide wheeled face and difficulty with hand controls.  We discussed how, at the 5-year mark, we may be able to do a repeated wheelchair evaluation and make adjustments or change devices through insurance.  If you get close to the 5-year mark and are interested in this, you may return to our office for wheelchair evaluation.  Follow-up as needed.

## 2023-11-06 DIAGNOSIS — M542 Cervicalgia: Secondary | ICD-10-CM | POA: Insufficient documentation

## 2023-11-10 DIAGNOSIS — I5042 Chronic combined systolic (congestive) and diastolic (congestive) heart failure: Secondary | ICD-10-CM | POA: Diagnosis not present

## 2023-11-10 DIAGNOSIS — E876 Hypokalemia: Secondary | ICD-10-CM | POA: Diagnosis not present

## 2023-11-10 DIAGNOSIS — I4891 Unspecified atrial fibrillation: Secondary | ICD-10-CM | POA: Diagnosis not present

## 2023-11-10 DIAGNOSIS — Z7901 Long term (current) use of anticoagulants: Secondary | ICD-10-CM | POA: Diagnosis not present

## 2023-11-10 DIAGNOSIS — Z954 Presence of other heart-valve replacement: Secondary | ICD-10-CM | POA: Diagnosis not present

## 2023-11-10 DIAGNOSIS — R82998 Other abnormal findings in urine: Secondary | ICD-10-CM | POA: Diagnosis not present

## 2023-11-10 DIAGNOSIS — Z0000 Encounter for general adult medical examination without abnormal findings: Secondary | ICD-10-CM | POA: Diagnosis not present

## 2023-11-10 DIAGNOSIS — I130 Hypertensive heart and chronic kidney disease with heart failure and stage 1 through stage 4 chronic kidney disease, or unspecified chronic kidney disease: Secondary | ICD-10-CM | POA: Diagnosis not present

## 2023-11-10 DIAGNOSIS — G231 Progressive supranuclear ophthalmoplegia [Steele-Richardson-Olszewski]: Secondary | ICD-10-CM | POA: Diagnosis not present

## 2023-11-22 DIAGNOSIS — I480 Paroxysmal atrial fibrillation: Secondary | ICD-10-CM | POA: Diagnosis not present

## 2023-11-22 DIAGNOSIS — Z952 Presence of prosthetic heart valve: Secondary | ICD-10-CM | POA: Diagnosis not present

## 2023-11-22 DIAGNOSIS — Z7901 Long term (current) use of anticoagulants: Secondary | ICD-10-CM | POA: Diagnosis not present

## 2023-12-03 ENCOUNTER — Other Ambulatory Visit: Payer: Self-pay | Admitting: Internal Medicine

## 2023-12-15 ENCOUNTER — Other Ambulatory Visit: Payer: Self-pay

## 2023-12-15 ENCOUNTER — Emergency Department (HOSPITAL_COMMUNITY)
Admission: EM | Admit: 2023-12-15 | Discharge: 2023-12-15 | Disposition: A | Discharge status: Home / Self Care | Payer: Medicare HMO | Attending: Emergency Medicine | Admitting: Emergency Medicine

## 2023-12-15 ENCOUNTER — Encounter (HOSPITAL_COMMUNITY): Payer: Self-pay

## 2023-12-15 ENCOUNTER — Emergency Department (HOSPITAL_COMMUNITY): Payer: Medicare HMO

## 2023-12-15 DIAGNOSIS — Z7901 Long term (current) use of anticoagulants: Secondary | ICD-10-CM | POA: Insufficient documentation

## 2023-12-15 DIAGNOSIS — Z23 Encounter for immunization: Secondary | ICD-10-CM | POA: Insufficient documentation

## 2023-12-15 DIAGNOSIS — W0110XA Fall on same level from slipping, tripping and stumbling with subsequent striking against unspecified object, initial encounter: Secondary | ICD-10-CM | POA: Diagnosis not present

## 2023-12-15 DIAGNOSIS — I480 Paroxysmal atrial fibrillation: Secondary | ICD-10-CM | POA: Diagnosis not present

## 2023-12-15 DIAGNOSIS — Z952 Presence of prosthetic heart valve: Secondary | ICD-10-CM | POA: Diagnosis not present

## 2023-12-15 DIAGNOSIS — I129 Hypertensive chronic kidney disease with stage 1 through stage 4 chronic kidney disease, or unspecified chronic kidney disease: Secondary | ICD-10-CM | POA: Diagnosis not present

## 2023-12-15 DIAGNOSIS — N184 Chronic kidney disease, stage 4 (severe): Secondary | ICD-10-CM | POA: Diagnosis not present

## 2023-12-15 DIAGNOSIS — S0101XA Laceration without foreign body of scalp, initial encounter: Secondary | ICD-10-CM | POA: Insufficient documentation

## 2023-12-15 DIAGNOSIS — Y93D9 Activity, other involving arts and handcrafts: Secondary | ICD-10-CM | POA: Insufficient documentation

## 2023-12-15 DIAGNOSIS — W19XXXA Unspecified fall, initial encounter: Secondary | ICD-10-CM

## 2023-12-15 DIAGNOSIS — I6523 Occlusion and stenosis of bilateral carotid arteries: Secondary | ICD-10-CM | POA: Diagnosis not present

## 2023-12-15 DIAGNOSIS — Z043 Encounter for examination and observation following other accident: Secondary | ICD-10-CM | POA: Diagnosis not present

## 2023-12-15 LAB — CBC WITH DIFFERENTIAL/PLATELET
Abs Immature Granulocytes: 0.01 10*3/uL (ref 0.00–0.07)
Basophils Absolute: 0 10*3/uL (ref 0.0–0.1)
Basophils Relative: 1 %
Eosinophils Absolute: 0.2 10*3/uL (ref 0.0–0.5)
Eosinophils Relative: 3 %
HCT: 34.7 % — ABNORMAL LOW (ref 36.0–46.0)
Hemoglobin: 11.3 g/dL — ABNORMAL LOW (ref 12.0–15.0)
Immature Granulocytes: 0 %
Lymphocytes Relative: 28 %
Lymphs Abs: 1.7 10*3/uL (ref 0.7–4.0)
MCH: 32.4 pg (ref 26.0–34.0)
MCHC: 32.6 g/dL (ref 30.0–36.0)
MCV: 99.4 fL (ref 80.0–100.0)
Monocytes Absolute: 0.5 10*3/uL (ref 0.1–1.0)
Monocytes Relative: 9 %
Neutro Abs: 3.6 10*3/uL (ref 1.7–7.7)
Neutrophils Relative %: 59 %
Platelets: 214 10*3/uL (ref 150–400)
RBC: 3.49 MIL/uL — ABNORMAL LOW (ref 3.87–5.11)
RDW: 14.1 % (ref 11.5–15.5)
WBC: 6.1 10*3/uL (ref 4.0–10.5)
nRBC: 0 % (ref 0.0–0.2)

## 2023-12-15 LAB — BASIC METABOLIC PANEL
Anion gap: 9 (ref 5–15)
BUN: 33 mg/dL — ABNORMAL HIGH (ref 8–23)
CO2: 26 mmol/L (ref 22–32)
Calcium: 10.1 mg/dL (ref 8.9–10.3)
Chloride: 103 mmol/L (ref 98–111)
Creatinine, Ser: 2.06 mg/dL — ABNORMAL HIGH (ref 0.44–1.00)
GFR, Estimated: 23 mL/min — ABNORMAL LOW (ref >60)
Glucose, Bld: 87 mg/dL (ref 70–99)
Potassium: 3.8 mmol/L (ref 3.5–5.1)
Sodium: 138 mmol/L (ref 135–145)

## 2023-12-15 MED ORDER — LIDOCAINE HCL 2 % IJ SOLN
10.0000 mL | Freq: Once | INTRAMUSCULAR | Status: AC
  Administered 2023-12-15: 200 mg via INTRADERMAL
  Filled 2023-12-15: qty 20

## 2023-12-15 MED ORDER — TETANUS-DIPHTH-ACELL PERTUSSIS 5-2.5-18.5 LF-MCG/0.5 IM SUSY
0.5000 mL | PREFILLED_SYRINGE | Freq: Once | INTRAMUSCULAR | Status: AC
  Administered 2023-12-15: 0.5 mL via INTRAMUSCULAR
  Filled 2023-12-15: qty 0.5

## 2023-12-15 NOTE — Progress Notes (Signed)
   12/15/23 1820  Spiritual Encounters  Type of Visit Initial  Care provided to: Pt and family  Referral source Trauma page  Reason for visit Trauma  OnCall Visit Yes   Responded to trauma page level 2 fall on thinners. Patient and daughter present. Provided spiritual care and prayer.

## 2023-12-15 NOTE — ED Provider Notes (Signed)
[place] EMERGENCY DEPARTMENT AT [facility] Provider Note   CSN: [id number] Arrival date & time: 12/15/23  1741     History  Chief Complaint  Patient presents with   Ladona Mow Talese Dejulio is a 82 y.o. female history of A-fib on Coumadin, subdural hemorrhage, here presenting with fall.  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  Patient was noted to have a large laceration in the scalp.  Level 2 trauma was activated by EMS.  Patient states that tetanus shot was maybe 8 years ago but she was not sure.  Denies other trauma.  The history is provided by the patient.       Home Medications Prior to Admission medications   Medication Sig Start Date End Date Taking? Authorizing Provider  warfarin (COUMADIN) 5 MG tablet 5 mg daily on Monday, Wednesday, Friday and Sunday 2.5 mg daily on Tuesday, Thursday and Saturday Patient taking differently: 2.5mg  Wed, 5 mg other days 03/16/23  Yes Setzer, Lynnell Jude, PA-C  acetaminophen (TYLENOL) 325 MG tablet Take 1-2 tablets (325-650 mg total) by mouth every 4 (four) hours as needed for mild pain. Patient taking differently: Take 500 mg by mouth every 4 (four) hours as needed for mild pain (pain score 1-3). 03/16/23   Setzer, Lynnell Jude, PA-C  carbidopa-levodopa (SINEMET IR) 25-100 MG tablet TAKE 2 TABLETS BY MOUTH THREE TIMES DAILY 03/31/23   Tat, Octaviano Batty, DO  Cholecalciferol (VITAMIN D3) 2000 units TABS Take 2,000 Units by mouth daily.     [provider]  famotidine-calcium carbonate-magnesium hydroxide (PEPCID COMPLETE) 10-800-165 MG CHEW chewable tablet Chew 1 tablet by mouth daily as needed (heartburn).    [provider]  ferrous sulfate 325 (65 FE) MG EC tablet Take 325 mg by mouth 3 (three) times a week. Mon Wed Fri    [provider]  Fish Oil-Krill Oil (SCHIFF KRILL & FISH OIL BLEND) CAPS Take 1 capsule by mouth daily at  12 noon.    [provider]  furosemide (LASIX) 20 MG tablet Take 1 tablet by mouth once daily 12/07/23   Marinus Maw, MD  melatonin 5 MG TABS Take 5 mg by mouth at bedtime.    [provider]  Multiple Vitamin (MULTIVITAMIN) tablet Take 1 tablet by mouth daily at 12 noon.    [provider]  Multiple Vitamins-Minerals (PRESERVISION AREDS 2) CAPS Take 1 capsule by mouth 2 (two) times daily.    [provider]  naphazoline-glycerin (CLEAR EYES REDNESS) 0.012-0.25 % SOLN Place 1-2 drops into both eyes 4 (four) times daily as needed for eye irritation. 03/16/23   Setzer, Lynnell Jude, PA-C  NON FORMULARY Robitussin Dm half of a cap full    [provider]  OVER THE COUNTER MEDICATION Take 1 capsule by mouth daily. Neuriva    [provider]  pantoprazole (PROTONIX) 40 MG tablet Take 40 mg by mouth daily. 06/07/17   [provider]  Probiotic Product (PROBIOTIC PO) Take 1 capsule by mouth daily.    [provider]  simvastatin (ZOCOR) 20 MG tablet Take 20 mg by mouth every evening.    [provider]  tamsulosin (FLOMAX) 0.4 MG CAPS capsule     [provider]  vitamin B-12 (CYANOCOBALAMIN) 1000 MCG tablet Take 1,000 mcg by mouth daily.    [provider]      Allergies  Demerol [meperidine], Oxycontin [oxycodone], Adhesive [tape], Alendronate sodium, Cozaar [losartan], Fosamax [alendronate], Ivp dye [iodinated contrast media], Losartan potassium, Meperidine hcl, Oxycodone hcl, and Phenergan [promethazine]    Review of Systems   Review of Systems  Skin:  Positive for wound.  All other systems reviewed and are negative.   Physical Exam Updated Vital Signs BP (!) 148/82   Pulse 76   Temp 98.7 F (37.1 C) (Oral)   Resp 18   Ht 4\' 11"  (1.499 m)   Wt 51.3 kg   SpO2 100%   BMI 22.82 kg/m  Physical Exam Vitals and nursing note reviewed.  Constitutional:      Appearance: Normal appearance.   HENT:     Head:     Comments: 10 cm laceration of the anterior scalp into the hairline    Nose: Nose normal.     Mouth/Throat:     Mouth: Mucous membranes are moist.     Pharynx: Oropharynx is clear.  Eyes:     Extraocular Movements: Extraocular movements intact.     Pupils: Pupils are equal, round, and reactive to light.  Cardiovascular:     Rate and Rhythm: Normal rate and regular rhythm.     Pulses: Normal pulses.     Heart sounds: Normal heart sounds.  Pulmonary:     Effort: Pulmonary effort is normal.     Breath sounds: Normal breath sounds.  Abdominal:     General: Abdomen is flat.     Palpations: Abdomen is soft.  Musculoskeletal:        General: Normal range of motion.     Cervical back: Normal range of motion and neck supple.     Comments: No obvious extremity trauma and no spinal tenderness  Skin:    General: Skin is warm.     Capillary Refill: Capillary refill takes less than 2 seconds.  Neurological:     General: No focal deficit present.     Mental Status: She is alert and oriented to person, place, and time.  Psychiatric:        Mood and Affect: Mood normal.        Behavior: Behavior normal.     ED Results / Procedures / Treatments   Labs (all labs ordered are listed, but only abnormal results are displayed) Labs Reviewed  CBC WITH DIFFERENTIAL/PLATELET - Abnormal; Notable for the following components:      Result Value   RBC 3.49 (*)    Hemoglobin 11.3 (*)    HCT 34.7 (*)    All other components within normal limits  BASIC METABOLIC PANEL - Abnormal; Notable for the following components:   BUN 33 (*)    Creatinine, Ser 2.06 (*)    GFR, Estimated 23 (*)    All other components within normal limits    EKG None  Radiology CT HEAD WO CONTRAST ( ) Result Date: 12/15/2023 CLINICAL DATA:  Fall EXAM: CT HEAD WITHOUT CONTRAST CT CERVICAL SPINE WITHOUT CONTRAST TECHNIQUE: Multidetector CT imaging of the head and cervical spine was performed  following the standard protocol without intravenous contrast. Multiplanar CT image reconstructions of the cervical spine were also generated. RADIATION DOSE REDUCTION: This exam was performed according to the departmental dose-optimization program which includes automated exposure control, adjustment of the mA and/or kV according to patient size and/or use of iterative reconstruction technique. COMPARISON:  03/31/2023 FINDINGS: CT HEAD FINDINGS Brain: There is no mass, hemorrhage or extra-axial collection. The size and configuration of the ventricles and extra-axial  CSF spaces are normal. There is hypoattenuation of the periventricular white matter, most commonly indicating chronic ischemic microangiopathy. Vascular: Atherosclerotic calcification of the internal carotid arteries at the skull base. No abnormal hyperdensity of the major intracranial arteries or dural venous sinuses. Skull: Midline frontal scalp laceration.  No skull fracture. Sinuses/Orbits: No fluid levels or advanced mucosal thickening of the visualized paranasal sinuses. No mastoid or middle ear effusion. The orbits are normal. CT CERVICAL SPINE FINDINGS Alignment: No static subluxation. Facets are aligned. Occipital condyles are normally positioned. Skull base and vertebrae: No acute fracture. Soft tissues and spinal canal: No prevertebral fluid or swelling. No visible canal hematoma. Disc levels: No advanced spinal canal or neural foraminal stenosis. Upper chest: No pneumothorax, pulmonary nodule or pleural effusion. Other: Normal visualized paraspinal cervical soft tissues. IMPRESSION: 1. No acute intracranial abnormality. 2. Midline frontal scalp laceration without skull fracture. 3. No acute fracture or static subluxation of the cervical spine. Electronically Signed   By: Deatra Robinson M.D.   On: 12/15/2023 19:03   CT Cervical Spine Wo Contrast Result Date: 12/15/2023 CLINICAL DATA:  Fall EXAM: CT HEAD WITHOUT CONTRAST CT CERVICAL SPINE  WITHOUT CONTRAST TECHNIQUE: Multidetector CT imaging of the head and cervical spine was performed following the standard protocol without intravenous contrast. Multiplanar CT image reconstructions of the cervical spine were also generated. RADIATION DOSE REDUCTION: This exam was performed according to the departmental dose-optimization program which includes automated exposure control, adjustment of the mA and/or kV according to patient size and/or use of iterative reconstruction technique. COMPARISON:  03/31/2023 FINDINGS: CT HEAD FINDINGS Brain: There is no mass, hemorrhage or extra-axial collection. The size and configuration of the ventricles and extra-axial CSF spaces are normal. There is hypoattenuation of the periventricular white matter, most commonly indicating chronic ischemic microangiopathy. Vascular: Atherosclerotic calcification of the internal carotid arteries at the skull base. No abnormal hyperdensity of the major intracranial arteries or dural venous sinuses. Skull: Midline frontal scalp laceration.  No skull fracture. Sinuses/Orbits: No fluid levels or advanced mucosal thickening of the visualized paranasal sinuses. No mastoid or middle ear effusion. The orbits are normal. CT CERVICAL SPINE FINDINGS Alignment: No static subluxation. Facets are aligned. Occipital condyles are normally positioned. Skull base and vertebrae: No acute fracture. Soft tissues and spinal canal: No prevertebral fluid or swelling. No visible canal hematoma. Disc levels: No advanced spinal canal or neural foraminal stenosis. Upper chest: No pneumothorax, pulmonary nodule or pleural effusion. Other: Normal visualized paraspinal cervical soft tissues. IMPRESSION: 1. No acute intracranial abnormality. 2. Midline frontal scalp laceration without skull fracture. 3. No acute fracture or static subluxation of the cervical spine. Electronically Signed   By: Deatra Robinson M.D.   On: 12/15/2023 19:03    Procedures Procedures     LACERATION REPAIR Performed by: Richardean Canal Authorized by: Richardean Canal Consent: Verbal consent obtained. Risks and benefits: risks, benefits and alternatives were discussed Consent given by: patient Patient identity confirmed: provided demographic data Prepped and Draped in normal sterile fashion Wound explored  Laceration Location: scalp   Laceration Length: 10 cm  No Foreign Bodies seen or palpated  Anesthesia: local infiltration  Local anesthetic: lidocaine 2 % no epinephrine  Anesthetic total: 10 ml  Irrigation method: syringe Amount of cleaning: standard  Skin closure: 4-0 ethilon  Number of sutures: 6  Technique: simple interrupted   Patient tolerance: Patient tolerated the procedure well with no immediate complications.   Medications Ordered in ED Medications  lidocaine (XYLOCAINE) 2 % (  with pres) injection 200 mg (has no administration in time range)    ED Course/ Medical Decision Making/ A&P                                 Medical Decision Making Makinly Mangel is a 82 y.o. female here presenting with fall and scalp laceration.  Patient is on Coumadin and INR was 2.0 earlier today.  Plan to get basic labs and CT head and cervical spine.  Will need to suture laceration and update tetanus  8:07 PM I reviewed patient's labs and creatinine is 2.0 and baseline is around 1.8.  CT did not show any intracranial hemorrhage.  Patient is well-appearing.  I was able to put 6 sutures.  Stable for discharge and suture removal in 7 to 10 days  Problems Addressed: Fall, initial encounter: acute illness or injury Scalp laceration, initial encounter: acute illness or injury  Amount and/or Complexity of Data Reviewed Labs: ordered. Decision-making details documented in ED Course. Radiology: ordered and independent interpretation performed. Decision-making details documented in ED Course.  Risk Prescription drug management.    Final Clinical  Impression(s) / ED Diagnoses Final diagnoses:  None    Rx / DC Orders ED Discharge Orders     None         Charlynne Pander, MD 12/15/23 2009

## 2023-12-15 NOTE — ED Notes (Signed)
Lidocaine and supplies at bedside.

## 2023-12-15 NOTE — ED Notes (Signed)
Trauma Response Nurse Documentation  [name] is a 82 y.o. female arriving to [facility] ED via EMS  On warfarin daily. Trauma was activated as a Level 2 based on the following trauma criteria Elderly patients > 65 with head trauma on anti-coagulation (excluding ASA).  Patient cleared for CT by Dr. Silverio Lay. Pt transported to CT with trauma response nurse and ED RN present to monitor. RN remained with the patient throughout their absence from the department for clinical observation. GCS 15.  History   Past Medical History:  Diagnosis Date   BPV (benign positional vertigo)    Chronic neck pain    C2-3 arthropathy   CKD (chronic kidney disease), stage II    Diverticulosis of colon    GERD (gastroesophageal reflux disease)    Hiatal hernia    History of breast cancer per pt no recurrence   dx 2004-- Left breast DCIS (ER/PR+, HER2 negative) s/p  partial masectomy w/ sln bx and  chemoradiotion (completed 2004)   History of herpes zoster    History of kidney stones    HTN (hypertension)    Hyperlipidemia    Irritable bowel syndrome    LBBB (left bundle branch block)    PAF (paroxysmal atrial fibrillation) (HCC)    a. CHA2DS2VASc = 4-->coumadin.   Parkinsonism [facility])    neurologist-  dr tat   PONV (postoperative nausea and vomiting)    S/P aortic valve replacement with prosthetic valve    a. 04/13/2006 s/p St Jude mechnical AVR for severe AS;  b. 09/2014 Echo: EF 40-45%, gr1 DD, mild AI/MR, triv TR/PR.   Squamous cell carcinoma of skin 04/06/2022   right nasal sidewall   SUI (stress urinary incontinence, female)    Symptomatic anemia    Venous varices       Past Surgical History:  Procedure Laterality Date   ANTERIOR AND POSTERIOR REPAIR  1990   cystocele/ rectocele   AORTIC VALVE REPLACEMENT  04/13/06   dr gerhardt   and Replacement Ascending Aorta (St. Jude mechanical prosthesis)   APPENDECTOMY  child 1950   BREAST EXCISIONAL BIOPSY Left    BREAST LUMPECTOMY Left  01/11/2003   malignant   BREAST SURGERY Left 2004   CARDIAC CATHETERIZATION  02-22-2001   dr [place]   minimal non-obstructive cad/  mild aortic stenosis and aortic root enlargement/  normal LVF, ef 65%   CARDIAC CATHETERIZATION  03-22-2006    dr [place]   no significant obstructive cad/  severe aortic stenosis and mild to moderate aortic root enlargement/  upper normal right heart pressures   CARDIOVASCULAR STRESS TEST  09-30-2011   dr [place]   lexiscan nuclear study w/ apical thinning  vs  small prior apical infarct w/ no significant ischemia/  hypokinesis of the distal septum and apex, ef 50%   CARPAL TUNNEL RELEASE Bilateral left 09-09-2004/  right 2007   CATARACT EXTRACTION W/ INTRAOCULAR LENS  IMPLANT, BILATERAL  2015   COLONOSCOPY N/A 10/03/2014   Procedure: COLONOSCOPY;  Surgeon: Iva Boop, MD;  Location: [facility] ENDOSCOPY;  Service: Endoscopy;  Laterality: N/A;   CYSTOSCOPY WITH RETROGRADE PYELOGRAM, URETEROSCOPY AND STENT PLACEMENT Right 09/17/2016   Procedure: CYSTOSCOPY WITH RIGHT RETROGRADE PYELOGRAM, RIGHT URETEROSCOPY AND STENT PLACEMENT LASER LITHOTRIPSY, RIGHT STENT PLACEMENT;  Surgeon: Crist Fat, MD;  Location: [facility];  Service: Urology;  Laterality: Right;   ESOPHAGEAL MANOMETRY N/A 08/12/2014   Procedure: ESOPHAGEAL MANOMETRY (EM);  Surgeon: Iva Boop, MD;  Location: [facility]  ENDOSCOPY;  Service: Endoscopy;  Laterality: N/A;   ESOPHAGOGASTRODUODENOSCOPY N/A 10/03/2014   Procedure: ESOPHAGOGASTRODUODENOSCOPY (EGD);  Surgeon: Iva Boop, MD;  Location: [facility] ENDOSCOPY;  Service: Endoscopy;  Laterality: N/A;   ESOPHAGOGASTRODUODENOSCOPY N/A 12/08/2018   Procedure: ESOPHAGOGASTRODUODENOSCOPY (EGD);  Surgeon: Sherrilyn Rist, MD;  Location: [facility] ENDOSCOPY;  Service: Gastroenterology;  Laterality: N/A;   EXTRACORPOREAL SHOCK WAVE LITHOTRIPSY  1997   FOOT SURGERY Right 2013   hammertoe x2   HOLMIUM LASER APPLICATION Right 09/17/2016   Procedure: HOLMIUM  LASER APPLICATION;  Surgeon: Crist Fat, MD;  Location[facility];  Service: Urology;  Laterality: Right;   PERCUTANEOUS NEPHROSTOLITHOTOMY  1993   STONE EXTRACTION WITH BASKET Right 09/17/2016   Procedure: STONE EXTRACTION WITH BASKET;  Surgeon: Crist Fat, MD;  Location: [facility];  Service: Urology;  Laterality: Right;   TOTAL ABDOMINAL HYSTERECTOMY  1971   w/ Bilateral Salpingoophorectomy   TRANSTHORACIC ECHOCARDIOGRAM  10/24/2014   dr [place]   hypokinesis of the basal and mid inferoseptal and inferior walls,  ef 40-45%,  grade 1 diastolic dysfunction/  mechanical bileaflet aortic valve noted w/ mild regur., peak grandient , valve area 1.35cm^2/  severe MV calcification without stenosis w/ mild regurg., peak gradient 49mmHg/  mild LAE/  poss. atrial mass (MRI showed benign lipomatous hypertrophy of atrial septum) Loretha Brasil PR/mild TR     Initial Focused Assessment (If applicable, or please see trauma documentation): Patient A&O x4, GCS 15, PERR 3 Airway intact, bilateral breath sounds Laceration to anterior forehead  CT's Completed:   CT Head and CT C-Spine   Interventions:  IV, labs CT Head/Cspine Lac repair  Plan for disposition:  Discharge home   Event Summary: Patient to ED after falling forward hitting her head on the brick fireplace. Large laceration to anterior forehead, takes coumadin for Afib. Patient went to coumadin clinic this AM and INR was subtherapeutic, was planning to increase dose this PM but has not taken it yet. Imaging revealed no acute intracranial abnormality. Laceration repaired by MD. Patient able to discharge home with husband.  Bedside handoff with ED RN Berna Spare.    Jill Side Adden Strout  Trauma Response RN  Please call TRN at [phone] for further assistance.

## 2023-12-15 NOTE — Discharge Instructions (Signed)
As we discussed, your CT scan did not show any internal bleeding.  I recommend that you have suture removal in 7 to 10 days.  Please keep wound clean and dry and hold Coumadin tonight.  You may wash your hair starting tomorrow  See your doctor for follow-up.  Return to ER if you have worse headache or vomiting or uncontrolled bleeding

## 2023-12-26 DIAGNOSIS — Z5189 Encounter for other specified aftercare: Secondary | ICD-10-CM | POA: Diagnosis not present

## 2023-12-26 DIAGNOSIS — Z4802 Encounter for removal of sutures: Secondary | ICD-10-CM | POA: Diagnosis not present

## 2024-01-02 ENCOUNTER — Encounter: Payer: Self-pay | Admitting: Neurology

## 2024-01-12 DIAGNOSIS — I480 Paroxysmal atrial fibrillation: Secondary | ICD-10-CM | POA: Diagnosis not present

## 2024-01-12 DIAGNOSIS — Z7901 Long term (current) use of anticoagulants: Secondary | ICD-10-CM | POA: Diagnosis not present

## 2024-01-12 DIAGNOSIS — Z952 Presence of prosthetic heart valve: Secondary | ICD-10-CM | POA: Diagnosis not present

## 2024-01-28 DIAGNOSIS — G20C Parkinsonism, unspecified: Secondary | ICD-10-CM | POA: Diagnosis not present

## 2024-01-28 DIAGNOSIS — W1830XA Fall on same level, unspecified, initial encounter: Secondary | ICD-10-CM | POA: Diagnosis not present

## 2024-01-28 DIAGNOSIS — S0083XA Contusion of other part of head, initial encounter: Secondary | ICD-10-CM | POA: Diagnosis not present

## 2024-01-28 DIAGNOSIS — N183 Chronic kidney disease, stage 3 unspecified: Secondary | ICD-10-CM | POA: Insufficient documentation

## 2024-01-28 DIAGNOSIS — Z85828 Personal history of other malignant neoplasm of skin: Secondary | ICD-10-CM | POA: Diagnosis not present

## 2024-01-28 DIAGNOSIS — I672 Cerebral atherosclerosis: Secondary | ICD-10-CM | POA: Diagnosis not present

## 2024-01-28 DIAGNOSIS — I130 Hypertensive heart and chronic kidney disease with heart failure and stage 1 through stage 4 chronic kidney disease, or unspecified chronic kidney disease: Secondary | ICD-10-CM | POA: Insufficient documentation

## 2024-01-28 DIAGNOSIS — I5042 Chronic combined systolic (congestive) and diastolic (congestive) heart failure: Secondary | ICD-10-CM | POA: Insufficient documentation

## 2024-01-28 DIAGNOSIS — Z853 Personal history of malignant neoplasm of breast: Secondary | ICD-10-CM | POA: Insufficient documentation

## 2024-01-28 DIAGNOSIS — I6782 Cerebral ischemia: Secondary | ICD-10-CM | POA: Diagnosis not present

## 2024-01-28 DIAGNOSIS — I6529 Occlusion and stenosis of unspecified carotid artery: Secondary | ICD-10-CM | POA: Diagnosis not present

## 2024-01-28 DIAGNOSIS — Z79899 Other long term (current) drug therapy: Secondary | ICD-10-CM | POA: Insufficient documentation

## 2024-01-28 DIAGNOSIS — S0990XA Unspecified injury of head, initial encounter: Secondary | ICD-10-CM | POA: Diagnosis not present

## 2024-01-28 DIAGNOSIS — Z7901 Long term (current) use of anticoagulants: Secondary | ICD-10-CM | POA: Diagnosis not present

## 2024-01-29 ENCOUNTER — Other Ambulatory Visit: Payer: Self-pay

## 2024-01-29 ENCOUNTER — Encounter (HOSPITAL_COMMUNITY): Payer: Self-pay | Admitting: *Deleted

## 2024-01-29 ENCOUNTER — Emergency Department (HOSPITAL_COMMUNITY): Payer: HMO

## 2024-01-29 ENCOUNTER — Emergency Department (HOSPITAL_COMMUNITY)
Admission: EM | Admit: 2024-01-29 | Discharge: 2024-01-29 | Disposition: A | Discharge status: Home / Self Care | Payer: Self-pay | Attending: Emergency Medicine | Admitting: Emergency Medicine

## 2024-01-29 DIAGNOSIS — W19XXXA Unspecified fall, initial encounter: Secondary | ICD-10-CM

## 2024-01-29 DIAGNOSIS — S0990XA Unspecified injury of head, initial encounter: Secondary | ICD-10-CM

## 2024-01-29 DIAGNOSIS — I6529 Occlusion and stenosis of unspecified carotid artery: Secondary | ICD-10-CM | POA: Diagnosis not present

## 2024-01-29 DIAGNOSIS — I672 Cerebral atherosclerosis: Secondary | ICD-10-CM | POA: Diagnosis not present

## 2024-01-29 DIAGNOSIS — Z7901 Long term (current) use of anticoagulants: Secondary | ICD-10-CM

## 2024-01-29 DIAGNOSIS — I6782 Cerebral ischemia: Secondary | ICD-10-CM | POA: Diagnosis not present

## 2024-01-29 LAB — BASIC METABOLIC PANEL
Anion gap: 12 (ref 5–15)
BUN: 37 mg/dL — ABNORMAL HIGH (ref 8–23)
CO2: 28 mmol/L (ref 22–32)
Calcium: 9.8 mg/dL (ref 8.9–10.3)
Chloride: 100 mmol/L (ref 98–111)
Creatinine, Ser: 1.98 mg/dL — ABNORMAL HIGH (ref 0.44–1.00)
GFR, Estimated: 25 mL/min — ABNORMAL LOW (ref >60)
Glucose, Bld: 101 mg/dL — ABNORMAL HIGH (ref 70–99)
Potassium: 4.1 mmol/L (ref 3.5–5.1)
Sodium: 140 mmol/L (ref 135–145)

## 2024-01-29 LAB — CBC WITH DIFFERENTIAL/PLATELET
Abs Immature Granulocytes: 0.01 10*3/uL (ref 0.00–0.07)
Basophils Absolute: 0.1 10*3/uL (ref 0.0–0.1)
Basophils Relative: 1 %
Eosinophils Absolute: 0.2 10*3/uL (ref 0.0–0.5)
Eosinophils Relative: 2 %
HCT: 29.2 % — ABNORMAL LOW (ref 36.0–46.0)
Hemoglobin: 9.3 g/dL — ABNORMAL LOW (ref 12.0–15.0)
Immature Granulocytes: 0 %
Lymphocytes Relative: 30 %
Lymphs Abs: 1.9 10*3/uL (ref 0.7–4.0)
MCH: 32.1 pg (ref 26.0–34.0)
MCHC: 31.8 g/dL (ref 30.0–36.0)
MCV: 100.7 fL — ABNORMAL HIGH (ref 80.0–100.0)
Monocytes Absolute: 0.6 10*3/uL (ref 0.1–1.0)
Monocytes Relative: 10 %
Neutro Abs: 3.7 10*3/uL (ref 1.7–7.7)
Neutrophils Relative %: 57 %
Platelets: 205 10*3/uL (ref 150–400)
RBC: 2.9 MIL/uL — ABNORMAL LOW (ref 3.87–5.11)
RDW: 13.9 % (ref 11.5–15.5)
WBC: 6.4 10*3/uL (ref 4.0–10.5)
nRBC: 0 % (ref 0.0–0.2)

## 2024-01-29 LAB — PROTIME-INR
INR: 3.3 — ABNORMAL HIGH (ref 0.8–1.2)
Prothrombin Time: 33.6 s — ABNORMAL HIGH (ref 11.4–15.2)

## 2024-01-29 NOTE — ED Provider Notes (Signed)
[facility] EMERG[facility] DEPARTMENT AT [facility] Provider Note  CSN: [id number] Arrival date & time: 01/28/24 2351  Chief Complaint(s) fall  trauma  HPI [name] is a 83 y.o. female with past medical history as below, significant for BPV, CKD,  PAF,  s/p aortic valve replace who presents to the ED with complaint of fall/head injury  Patient ports just prior to arrival she was bending over to pick up object on the floor and fell forwards.  Hit her head on the ground.  No LOC.  She is on warfarin.  Last INR was around 2 per the patient.  This is for aortic valve prosthetic.  Reports normal state of health prior to the fall.  No nausea or vomiting.  No chest pain or dyspnea.  Here with family  Past Medical History Past Medical History:  Diagnosis Date   BPV (benign positional vertigo)    Chronic neck pain    C2-3 arthropathy   CKD (chronic kidney disease), stage II    Diverticulosis of colon    GERD (gastroesophageal reflux disease)    Hiatal hernia    History of breast cancer per pt no recurrence   dx 2004-- Left breast DCIS (ER/PR+, HER2 negative) s/p  partial masectomy w/ sln bx and  chemoradiotion (completed 2004)   History of herpes zoster    History of kidney stones    HTN (hypertension)    Hyperlipidemia    Irritable bowel syndrome    LBBB (left bundle branch block)    PAF (paroxysmal atrial fibrillation) (HCC)    a. CHA2DS2VASc = 4-->coumadin.   Parkinsonism [facility])    neurologist-  dr tat   PONV (postoperative nausea and vomiting)    S/P aortic valve replacement with prosthetic valve    a. 04/13/2006 s/p St Jude mechnical AVR for severe AS;  b. 09/2014 Echo: EF 40-45%, gr1 DD, mild AI/MR, triv TR/PR.   Squamous cell carcinoma of skin 04/06/2022   right nasal sidewall   SUI (stress urinary incontinence, female)    Symptomatic anemia    Venous varices     Patient Active Problem List   Diagnosis Date Noted   Neck pain 11/06/2023   Abnormality of gait  and mobility 05/04/2023   Dry eye 05/04/2023   Hammertoe of left foot 05/04/2023   TBI (traumatic brain injury) (HCC) 03/08/2023   Traumatic subdural hematoma (SDH) (HCC) 03/08/2023   Fracture of unspecified part of left clavicle, initial encounter for closed fracture 03/08/2023   Fall 02/24/2023   AVD (aortic valve disease) 02/24/2023   Pressure injury of skin 02/08/2022   Rectus sheath hematoma, initial encounter 02/05/2022   Paroxysmal atrial fibrillation (HCC) 04/09/2021   Chronic combined systolic and diastolic heart failure (HCC) 07/17/2020   PSP (progressive supranuclear palsy) (HCC) 02/29/2020   Atrial flutter (HCC)    Heme positive stool    Symptomatic anemia 12/05/2018   GI bleed 12/05/2018   CKD (chronic kidney disease), stage III (HCC) 12/05/2018   Elevated d-dimer 12/05/2018   SOB (shortness of breath) 12/05/2018   Supratherapeutic INR 12/05/2018   Parkinsonism (HCC)    Elevated troponin 09/19/2016   Emesis 09/19/2016   Sinus bradycardia 09/16/2016   Family history of colon cancer 07/30/2014   Hiatal hernia 07/30/2014   Chest pain 10/13/2011   Disequilibrium 10/13/2011   Heart valve replaced by other means 04/16/2011   Chronic anticoagulation 03/17/2011   S/P AVR (aortic valve replacement)    PVC's (premature ventricular contractions)  HTN (hypertension)    Hypercholesteremia    Atrial fibrillation with RVR (HCC)    GERD (gastroesophageal reflux disease)    GERD 02/06/2010   DIVERTICULOSIS-COLON 02/06/2010   History of colonic polyps 02/06/2010   MASTECTOMY, HX OF 02/06/2010   Home Medication(s) Prior to Admission medications   Medication Sig Start Date End Date Taking? Authorizing Provider  acetaminophen (TYLENOL) 325 MG tablet Take 1-2 tablets (325-650 mg total) by mouth every 4 (four) hours as needed for mild pain. Patient taking differently: Take 500 mg by mouth every 4 (four) hours as needed for mild pain (pain score 1-3). 03/16/23   Setzer, Lynnell Jude,  PA-C  carbidopa-levodopa (SINEMET IR) 25-100 MG tablet TAKE 2 TABLETS BY MOUTH THREE TIMES DAILY 03/31/23   Tat, Octaviano Batty, DO  Cholecalciferol (VITAMIN D3) 2000 units TABS Take 2,000 Units by mouth daily.     [provider]  famotidine-calcium carbonate-magnesium hydroxide (PEPCID COMPLETE) 10-800-165 MG CHEW chewable tablet Chew 1 tablet by mouth daily as needed (heartburn).    [provider]  ferrous sulfate 325 (65 FE) MG EC tablet Take 325 mg by mouth 3 (three) times a week. Mon Wed Fri    [provider]  Fish Oil-Krill Oil (SCHIFF KRILL & FISH OIL BLEND) CAPS Take 1 capsule by mouth daily at 12 noon.    [provider]  furosemide (LASIX) 20 MG tablet Take 1 tablet by mouth once daily 12/07/23   Marinus Maw, MD  melatonin 5 MG TABS Take 5 mg by mouth at bedtime.    [provider]  Multiple Vitamin (MULTIVITAMIN) tablet Take 1 tablet by mouth daily at 12 noon.    [provider]  Multiple Vitamins-Minerals (PRESERVISION AREDS 2) CAPS Take 1 capsule by mouth 2 (two) times daily.    [provider]  naphazoline-glycerin (CLEAR EYES REDNESS) 0.012-0.25 % SOLN Place 1-2 drops into both eyes 4 (four) times daily as needed for eye irritation. 03/16/23   Setzer, Lynnell Jude, PA-C  NON FORMULARY Robitussin Dm half of a cap full    [provider]  OVER THE COUNTER MEDICATION Take 1 capsule by mouth daily. Neuriva    [provider]  pantoprazole (PROTONIX) 40 MG tablet Take 40 mg by mouth daily. 06/07/17   [provider]  Probiotic Product (PROBIOTIC PO) Take 1 capsule by mouth daily.    [provider]  simvastatin (ZOCOR) 20 MG tablet Take 20 mg by mouth every evening.    [provider]  tamsulosin (FLOMAX) 0.4 MG CAPS capsule     [provider]  vitamin B-12 (CYANOCOBALAMIN) 1000 MCG tablet Take 1,000 mcg by mouth daily.    [provider]  warfarin (COUMADIN) 5 MG  tablet 5 mg daily on Monday, Wednesday, Friday and Sunday 2.5 mg daily on Tuesday, Thursday and Saturday Patient taking differently: 2.5mg  Wed, 5 mg other days 03/16/23   Milinda Antis, PA-C  Past Surgical History Past Surgical History:  Procedure Laterality Date   ANTERIOR AND POSTERIOR REPAIR  1990   cystocele/ rectocele   AORTIC VALVE REPLACEMENT  04/13/06   dr gerhardt   and Replacement Ascending Aorta (St. Jude mechanical prosthesis)   APPENDECTOMY  child 1950   BREAST EXCISIONAL BIOPSY Left    BREAST LUMPECTOMY Left 01/11/2003   malignant   BREAST SURGERY Left 2004   CARDIAC CATHETERIZATION  02-22-2001   dr [place]   minimal non-obstructive cad/  mild aortic stenosis and aortic root enlargement/  normal LVF, ef 65%   CARDIAC CATHETERIZATION  03-22-2006    dr [place]   no significant obstructive cad/  severe aortic stenosis and mild to moderate aortic root enlargement/  upper normal right heart pressures   CARDIOVASCULAR STRESS TEST  09-30-2011   dr [place]   lexiscan nuclear study w/ apical thinning  vs  small prior apical infarct w/ no significant ischemia/  hypokinesis of the distal septum and apex, ef 50%   CARPAL TUNNEL RELEASE Bilateral left 09-09-2004/  right 2007   CATARACT EXTRACTION W/ INTRAOCULAR LENS  IMPLANT, BILATERAL  2015   COLONOSCOPY N/A 10/03/2014   Procedure: COLONOSCOPY;  Surgeon: Iva Boop, MD;  Location: [facility] ENDOSCOPY;  Service: Endoscopy;  Laterality: N/A;   CYSTOSCOPY WITH RETROGRADE PYELOGRAM, URETEROSCOPY AND STENT PLACEMENT Right 09/17/2016   Procedure: CYSTOSCOPY WITH RIGHT RETROGRADE PYELOGRAM, RIGHT URETEROSCOPY AND STENT PLACEMENT LASER LITHOTRIPSY, RIGHT STENT PLACEMENT;  Surgeon: Crist Fat, MD;  Location: [facility];  Service: Urology;  Laterality: Right;   ESOPHAGEAL MANOMETRY N/A 08/12/2014    Procedure: ESOPHAGEAL MANOMETRY (EM);  Surgeon: Iva Boop, MD;  Location: [facility] ENDOSCOPY;  Service: Endoscopy;  Laterality: N/A;   ESOPHAGOGASTRODUODENOSCOPY N/A 10/03/2014   Procedure: ESOPHAGOGASTRODUODENOSCOPY (EGD);  Surgeon: Iva Boop, MD;  Location: [facility] ENDOSCOPY;  Service: Endoscopy;  Laterality: N/A;   ESOPHAGOGASTRODUODENOSCOPY N/A 12/08/2018   Procedure: ESOPHAGOGASTRODUODENOSCOPY (EGD);  Surgeon: Sherrilyn Rist, MD;  Location: [facility] ENDOSCOPY;  Service: Gastroenterology;  Laterality: N/A;   EXTRACORPOREAL SHOCK WAVE LITHOTRIPSY  1997   FOOT SURGERY Right 2013   hammertoe x2   HOLMIUM LASER APPLICATION Right 09/17/2016   Procedure: HOLMIUM LASER APPLICATION;  Surgeon: Crist Fat, MD;  Location: [facility];  Service: Urology;  Laterality: Right;   PERCUTANEOUS NEPHROSTOLITHOTOMY  1993   STONE EXTRACTION WITH BASKET Right 09/17/2016   Procedure: STONE EXTRACTION WITH BASKET;  Surgeon: Crist Fat, MD;  Location: [facility];  Service: Urology;  Laterality: Right;   TOTAL ABDOMINAL HYSTERECTOMY  1971   w/ Bilateral Salpingoophorectomy   TRANSTHORACIC ECHOCARDIOGRAM  10/24/2014   dr [place]   hypokinesis of the basal and mid inferoseptal and inferior walls,  ef 40-45%,  grade 1 diastolic dysfunction/  mechanical bileaflet aortic valve noted w/ mild regur., peak grandient , valve area 1.35cm^2/  severe MV calcification without stenosis w/ mild regurg., peak gradient 47mmHg/  mild LAE/  poss. atrial mass (MRI showed benign lipomatous hypertrophy of atrial septum) Loretha Brasil PR/mild TR   Family History Family History  Problem Relation Age of Onset   Heart disease Mother    Heart disease Father 5       CABG   Other Brother        AVR   Healthy Child     Social History Social History   Tobacco Use   Smoking status: Never   Smokeless tobacco: Never  Vaping Use  Vaping status: Never Used  Substance Use Topics   Alcohol use:  No    Alcohol/week: 0.0 standard drinks of alcohol   Drug use: No   Allergies Demerol [meperidine], Oxycontin [oxycodone], Adhesive [tape], Alendronate sodium, Cozaar [losartan], Fosamax [alendronate], Ivp dye [iodinated contrast media], Losartan potassium, Meperidine hcl, Oxycodone hcl, and Phenergan [promethazine]  Review of Systems Review of Systems  Constitutional:  Negative for chills and fever.  Respiratory:  Negative for cough, chest tightness and shortness of breath.   Cardiovascular:  Negative for chest pain.  Gastrointestinal:  Negative for abdominal pain.  Genitourinary:  Negative for dysuria.  Musculoskeletal:  Negative for back pain and neck pain.  Skin:  Positive for wound.  Neurological:  Positive for headaches.  All other systems reviewed and are negative.   Physical Exam Vital Signs  I have reviewed the triage vital signs BP (!) 142/65   Pulse 70   Temp 98.1 F (36.7 C) (Oral)   Resp (!) 23   Ht 4\' 11"  (1.499 m)   Wt 51.3 kg   BMI 22.84 kg/m  Physical Exam Vitals and nursing note reviewed.  Constitutional:      General: She is not in acute distress.    Appearance: Normal appearance.  HENT:     Head: Normocephalic.     Comments: Hematoma left forehead    Right Ear: External ear normal.     Left Ear: External ear normal.     Nose: Nose normal.     Mouth/Throat:     Mouth: Mucous membranes are moist.  Eyes:     General: No scleral icterus.       Right eye: No discharge.        Left eye: No discharge.     Extraocular Movements: Extraocular movements intact.     Pupils: Pupils are equal, round, and reactive to light.  Cardiovascular:     Rate and Rhythm: Normal rate and regular rhythm.     Pulses: Normal pulses.     Heart sounds: Normal heart sounds.  Pulmonary:     Effort: Pulmonary effort is normal. No respiratory distress.     Breath sounds: Normal breath sounds. No stridor.  Abdominal:     General: Abdomen is flat. There is no distension.      Palpations: Abdomen is soft.     Tenderness: There is no abdominal tenderness.  Musculoskeletal:     Cervical back: No rigidity.     Right lower leg: No edema.     Left lower leg: No edema.  Skin:    General: Skin is warm and dry.     Capillary Refill: Capillary refill takes less than 2 seconds.  Neurological:     Mental Status: She is alert and oriented to person, place, and time.     GCS: GCS eye subscore is 4. GCS verbal subscore is 5. GCS motor subscore is 6.     Cranial Nerves: Cranial nerves 2-12 are intact.     Sensory: Sensation is intact.     Motor: Motor function is intact.     Coordination: Coordination is intact.     Comments: Gait testing deferred secondary to patient safety.    Psychiatric:        Mood and Affect: Mood normal.        Behavior: Behavior normal. Behavior is cooperative.     ED Results and Treatments Labs (all labs ordered are listed, but only abnormal results are displayed) Labs Reviewed  CBC  WITH DIFFERENTIAL/PLATELET - Abnormal; Notable for the following components:      Result Value   RBC 2.90 (*)    Hemoglobin 9.3 (*)    HCT 29.2 (*)    MCV 100.7 (*)    All other components within normal limits  BASIC METABOLIC PANEL - Abnormal; Notable for the following components:   Glucose, Bld 101 (*)    BUN 37 (*)    Creatinine, Ser 1.98 (*)    GFR, Estimated 25 (*)    All other components within normal limits  PROTIME-INR - Abnormal; Notable for the following components:   Prothrombin Time 33.6 (*)    INR 3.3 (*)    All other components within normal limits                                                                                                                          Radiology CT Head Wo Contrast Result Date: 01/29/2024 CLINICAL DATA:  Fall.  On warfarin.  Head and neck trauma. EXAM: CT HEAD WITHOUT CONTRAST CT CERVICAL SPINE WITHOUT CONTRAST TECHNIQUE: Multidetector CT imaging of the head and cervical spine was performed following the  standard protocol without intravenous contrast. Multiplanar CT image reconstructions of the cervical spine were also generated. RADIATION DOSE REDUCTION: This exam was performed according to the departmental dose-optimization program which includes automated exposure control, adjustment of the mA and/or kV according to patient size and/or use of iterative reconstruction technique. COMPARISON:  CT head and cervical spine 12/15/2023 FINDINGS: CT HEAD FINDINGS Brain: No intracranial hemorrhage, mass effect, or evidence of acute infarct. No hydrocephalus. No extra-axial fluid collection. Age related cerebral atrophy and chronic small vessel ischemic disease. Vascular: No hyperdense vessel. Intracranial arterial calcification. Skull: No fracture or focal lesion. Sinuses/Orbits: No acute finding. Other: None. CT CERVICAL SPINE FINDINGS Alignment: No evidence of traumatic malalignment. Skull base and vertebrae: No acute fracture. No primary bone lesion or focal pathologic process. Soft tissues and spinal canal: No prevertebral fluid or swelling. No visible canal hematoma. Disc levels: Multilevel spondylosis and facet arthropathy unchanged from 12/15/2023. No severe spinal canal narrowing. Upper chest: No acute abnormality. Other: Carotid calcification. IMPRESSION: 1. No acute intracranial abnormality. 2. No acute fracture in the cervical spine. Electronically Signed   By: Minerva Fester M.D.   On: 01/29/2024 02:12   CT Cervical Spine Wo Contrast Result Date: 01/29/2024 CLINICAL DATA:  Fall.  On warfarin.  Head and neck trauma. EXAM: CT HEAD WITHOUT CONTRAST CT CERVICAL SPINE WITHOUT CONTRAST TECHNIQUE: Multidetector CT imaging of the head and cervical spine was performed following the standard protocol without intravenous contrast. Multiplanar CT image reconstructions of the cervical spine were also generated. RADIATION DOSE REDUCTION: This exam was performed according to the departmental dose-optimization program which  includes automated exposure control, adjustment of the mA and/or kV according to patient size and/or use of iterative reconstruction technique. COMPARISON:  CT head and cervical spine 12/15/2023 FINDINGS: CT HEAD FINDINGS Brain: No intracranial  hemorrhage, mass effect, or evidence of acute infarct. No hydrocephalus. No extra-axial fluid collection. Age related cerebral atrophy and chronic small vessel ischemic disease. Vascular: No hyperdense vessel. Intracranial arterial calcification. Skull: No fracture or focal lesion. Sinuses/Orbits: No acute finding. Other: None. CT CERVICAL SPINE FINDINGS Alignment: No evidence of traumatic malalignment. Skull base and vertebrae: No acute fracture. No primary bone lesion or focal pathologic process. Soft tissues and spinal canal: No prevertebral fluid or swelling. No visible canal hematoma. Disc levels: Multilevel spondylosis and facet arthropathy unchanged from 12/15/2023. No severe spinal canal narrowing. Upper chest: No acute abnormality. Other: Carotid calcification. IMPRESSION: 1. No acute intracranial abnormality. 2. No acute fracture in the cervical spine. Electronically Signed   By: Minerva Fester M.D.   On: 01/29/2024 02:12    Pertinent labs & imaging results that were available during my care of the patient were reviewed by me and considered in my medical decision making (see MDM for details).  Medications Ordered in ED Medications - No data to display                                                                                                                                   Procedures Procedures  (including critical care time)  Medical Decision Making / ED Course    Medical Decision Making:    Cerina Leary is a 83 y.o. female with past medical history as below, significant for BPV, CKD,  PAF,  s/p aortic valve replace who presents to the ED with complaint of fall/head injury. The complaint involves an extensive differential  diagnosis and also carries with it a high risk of complications and morbidity.  Serious etiology was considered. Ddx includes but is not limited to: Differential diagnoses for head trauma includes subdural hematoma, epidural hematoma, acute concussion, traumatic subarachnoid hemorrhage, cerebral contusions, etc.   Complete initial physical exam performed, notably the patient was in no distress.    Reviewed and confirmed nursing documentation for past medical history, family history, social history.  Vital signs reviewed.   Clinical Course as of 01/29/24 0240  Sun Jan 29, 2024  0145 Creatinine(!): 1.98 Similar to prior  [SG]  0145 Hemoglobin(!): 9.3 Within her typical range, denies and brbpr or melena, denies any bleeding  [SG]    Clinical Course User Index [SG] Sloan Leiter, DO    Brief summary: 83 year old female with history as above including prosthetic arctic valve and PAF on Coumadin here with fall, head injury.  She has hematoma to her left forehead.  No other injuries on exam.  Exam otherwise stable.  Primary survey was completed, airway intact, trachea midline, clear breath sounds bilateral.  Extremities are warm well-perfused.  GCS 15, ANO x 3.  Her neuroexam is nonfocal.  Collect screening labs given that she is on warfarin, check INR.  CT head and C-spine.  She is well-appearing, she has mild headache.  Nonfocal neuroexam  Labs reviewed, INR is elevated above her typical range, she is anticoagulated on Coumadin for her prosthetic aortic valve, usually around 2-2.5.  Recommend she follow-up with Coumadin clinic.  CT imaging is reassuring.  Concussion precautions given to patient and family bedside.  She is feeling better.  She has no headache.  Eager for discharge.    The patient improved significantly and was discharged in stable condition. Detailed discussions were had with the patient/guardian regarding current findings, and need for close f/u with PCP or on call doctor. The  patient/guardian has been instructed to return immediately if the symptoms worsen in any way for re-evaluation. Patient/guardian verbalized understanding and is in agreement with current care plan. All questions answered prior to discharge.               Additional history obtained: -Additional history obtained from family -External records from outside source obtained and reviewed including: Chart review including previous notes, labs, imaging, consultation notes including  Prior labs, home meds, allergy list   Lab Tests: -I ordered, reviewed, and interpreted labs.   The pertinent results include:   Labs Reviewed  CBC WITH DIFFERENTIAL/PLATELET - Abnormal; Notable for the following components:      Result Value   RBC 2.90 (*)    Hemoglobin 9.3 (*)    HCT 29.2 (*)    MCV 100.7 (*)    All other components within normal limits  BASIC METABOLIC PANEL - Abnormal; Notable for the following components:   Glucose, Bld 101 (*)    BUN 37 (*)    Creatinine, Ser 1.98 (*)    GFR, Estimated 25 (*)    All other components within normal limits  PROTIME-INR - Abnormal; Notable for the following components:   Prothrombin Time 33.6 (*)    INR 3.3 (*)    All other components within normal limits    Notable for as above  EKG   EKG Interpretation Date/Time:    Ventricular Rate:    PR Interval:    QRS Duration:    QT Interval:    QTC Calculation:   R Axis:      Text Interpretation:           Imaging Studies ordered: I ordered imaging studies including CT head, CT C-spine I independently visualized the following imaging with scope of interpretation limited to determining acute life threatening conditions related to emergency care; findings noted above I independently visualized and interpreted imaging. I agree with the radiologist interpretation   Medicines ordered and prescription drug management: No orders of the defined types were placed in this encounter.   -I  have reviewed the patients home medicines and have made adjustments as needed   Consultations Obtained: na   Cardiac Monitoring: Continuous pulse oximetry interpreted by myself, 100% on RA.    Social Determinants of Health:  Diagnosis or treatment significantly limited by social determinants of health: lives at home   Reevaluation: After the interventions noted above, I reevaluated the patient and found that they have improved  Co morbidities that complicate the patient evaluation  Past Medical History:  Diagnosis Date   BPV (benign positional vertigo)    Chronic neck pain    C2-3 arthropathy   CKD (chronic kidney disease), stage II    Diverticulosis of colon    GERD (gastroesophageal reflux disease)    Hiatal hernia    History of breast cancer per pt no recurrence   dx 2004-- Left breast DCIS (ER/PR+, HER2 negative) s/p  partial masectomy w/ sln bx and  chemoradiotion (completed 2004)   History of herpes zoster    History of kidney stones    HTN (hypertension)    Hyperlipidemia    Irritable bowel syndrome    LBBB (left bundle branch block)    PAF (paroxysmal atrial fibrillation) (HCC)    a. CHA2DS2VASc = 4-->coumadin.   Parkinsonism [facility])    neurologist-  dr tat   PONV (postoperative nausea and vomiting)    S/P aortic valve replacement with prosthetic valve    a. 04/13/2006 s/p St Jude mechnical AVR for severe AS;  b. 09/2014 Echo: EF 40-45%, gr1 DD, mild AI/MR, triv TR/PR.   Squamous cell carcinoma of skin 04/06/2022   right nasal sidewall   SUI (stress urinary incontinence, female)    Symptomatic anemia    Venous varices        Dispostion: Disposition decision including need for hospitalization was considered, and patient discharged from emergency department.    Final Clinical Impression(s) / ED Diagnoses Final diagnoses:  Injury of head, initial encounter  Anticoagulated on Coumadin  Fall, initial encounter        Sloan Leiter, DO 01/29/24  0454

## 2024-01-29 NOTE — Discharge Instructions (Addendum)
Based on the events which brought you to the ER today, it is possible that you may have a concussion. A concussion occurs when there is a blow to the head or body, with enough force to shake the brain and disrupt how the brain functions. You may experience symptoms such as headaches, sensitivity to light/noise, dizziness, cognitive slowing, difficulty concentrating / remembering, trouble sleeping and drowsiness. These symptoms may last anywhere from hours/days to potentially weeks/months. While these symptoms are very frustrating and perhaps debilitating, it is important that you remember that they will improve over time. Everyone has a different rate of recovery; it is difficult to predict when your symptoms will resolve. In order to allow for your brain to heal after the injury, we recommend that you see your primary physician or a physician knowledgeable in concussion management. We also advise you to let your body and brain rest: avoid physical activities (sports, gym, and exercise) and reduce cognitive demands (reading, texting, TV watching, computer use, video games, etc). School attendance, after-school activities and work may need to be modified to avoid increasing symptoms. We recommend against driving until until all symptoms have resolved. Come back to the ER right away if you are having repeated episodes of vomiting, severe/worsening headache/dizziness or any other symptom that alarms you. We recommended that someone stay with you for the next 24 hours to monitor for these worrisome symptoms.   Your INR today was 3.3. This is slightly elevated from your typical range. Please follow-up with Coumadin clinic/pcp.   It was a pleasure caring for you today in the emergency department.  Please return to the emergency department for any worsening or worrisome symptoms.

## 2024-01-29 NOTE — ED Triage Notes (Signed)
The pt fell in her home around 2300 she stumbled and fell with her head striking the sloor  no loc she has a hematoma with an abrasion to her lt forehead alert no distress with family  level 2 called oout

## 2024-01-29 NOTE — Progress Notes (Signed)
   01/29/24 0212  Spiritual Encounters  Type of Visit Initial  Care provided to: Pt and family  Referral source Trauma page  Reason for visit Trauma  OnCall Visit Yes   Reason for Visit: Chaplain responding to Level 2 Trauma page, Fall on Thinners  Time of Visit: 15 Minutes  Description of Visit: Chaplain arrived in the room and the medical team had finished their work.  Pt was resting comfortably in the bed, with her daughter and grandson at bedside.   Chaplain spoke briefly with Pt to encourage her and ask if she had any needs.  She expressed none.   As chaplain prepared to leave, Pt's daughter asked for prayer and Pt agreed.  Chaplain provided [employer].   Plan of Care: Chaplain will continue to follow this Pt and family for the remainder of the evening.  Chaplain services remain available by Spiritual Consult or for emergent cases, paging [phone]  Chaplain Raelene Bott, MDiv [email or web address] [phone]

## 2024-01-29 NOTE — ED Notes (Signed)
S/p CT scan

## 2024-02-02 DIAGNOSIS — D649 Anemia, unspecified: Secondary | ICD-10-CM | POA: Diagnosis not present

## 2024-02-02 DIAGNOSIS — W19XXXA Unspecified fall, initial encounter: Secondary | ICD-10-CM | POA: Diagnosis not present

## 2024-02-02 DIAGNOSIS — S0990XA Unspecified injury of head, initial encounter: Secondary | ICD-10-CM | POA: Diagnosis not present

## 2024-02-02 DIAGNOSIS — Z7901 Long term (current) use of anticoagulants: Secondary | ICD-10-CM | POA: Diagnosis not present

## 2024-02-02 DIAGNOSIS — I10 Essential (primary) hypertension: Secondary | ICD-10-CM | POA: Diagnosis not present

## 2024-02-02 DIAGNOSIS — I480 Paroxysmal atrial fibrillation: Secondary | ICD-10-CM | POA: Diagnosis not present

## 2024-02-02 DIAGNOSIS — Z952 Presence of prosthetic heart valve: Secondary | ICD-10-CM | POA: Diagnosis not present

## 2024-02-07 DIAGNOSIS — I480 Paroxysmal atrial fibrillation: Secondary | ICD-10-CM | POA: Diagnosis not present

## 2024-02-07 DIAGNOSIS — Z952 Presence of prosthetic heart valve: Secondary | ICD-10-CM | POA: Diagnosis not present

## 2024-02-07 DIAGNOSIS — Z7901 Long term (current) use of anticoagulants: Secondary | ICD-10-CM | POA: Diagnosis not present

## 2024-03-06 DIAGNOSIS — Z952 Presence of prosthetic heart valve: Secondary | ICD-10-CM | POA: Diagnosis not present

## 2024-03-06 DIAGNOSIS — I480 Paroxysmal atrial fibrillation: Secondary | ICD-10-CM | POA: Diagnosis not present

## 2024-03-06 DIAGNOSIS — D649 Anemia, unspecified: Secondary | ICD-10-CM | POA: Diagnosis not present

## 2024-03-06 DIAGNOSIS — Z7901 Long term (current) use of anticoagulants: Secondary | ICD-10-CM | POA: Diagnosis not present

## 2024-03-21 DIAGNOSIS — I480 Paroxysmal atrial fibrillation: Secondary | ICD-10-CM | POA: Diagnosis not present

## 2024-03-21 DIAGNOSIS — Z952 Presence of prosthetic heart valve: Secondary | ICD-10-CM | POA: Diagnosis not present

## 2024-03-21 DIAGNOSIS — Z7901 Long term (current) use of anticoagulants: Secondary | ICD-10-CM | POA: Diagnosis not present

## 2024-04-04 ENCOUNTER — Ambulatory Visit: Payer: Medicare HMO | Admitting: Neurology

## 2024-04-10 DIAGNOSIS — Z7901 Long term (current) use of anticoagulants: Secondary | ICD-10-CM | POA: Diagnosis not present

## 2024-04-10 DIAGNOSIS — Z952 Presence of prosthetic heart valve: Secondary | ICD-10-CM | POA: Diagnosis not present

## 2024-04-10 DIAGNOSIS — I480 Paroxysmal atrial fibrillation: Secondary | ICD-10-CM | POA: Diagnosis not present

## 2024-04-24 NOTE — Progress Notes (Unsigned)
 Assessment/Plan:   1.  PSP  -Patient understands differences between PSP and Parkinson's disease.  Understands that there is an increased risk of falls, aspiration as subsequent morbidity and mortality from this disease compared to Parkinson's disease.    - She knows that I do not want her walking at all, unless somebody is directly holding her  -weaning off of the levodopa  and I agree with this.  Insomnia did improve when we discontinued, but also PSP is generally nonresponsive to levodopa .   2.  Neck pain with cervical dystonia  -This is actually improved with levodopa  therapy.  We will see what happens as we get her off of it  -Her last MRI was in 2017 with evidence of severe C2/C3 left facet arthropathy with joint effusion and bone marrow edema.  She received injections at the time and pain improved.  3.  Insomnia  -can increase melatonin if needed but doing better with lower levodopa  dose   4.  A-fib  -on coumadin .  Cardiologist said that Coumadin  could not be stopped because she had a mechanical valve, but he did recommend lower target range of INR.  However, he said PCP is monitoring/managing that.  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  Eyelid opening apraxia  -not interested in the botox.  Apparently had it years ago for blepharospasm  Subjective:   [name] was seen today in follow up for PSP.  My previous records were reviewed prior to todays visit as well as outside records available to me.  This patient is accompanied in the office by her spouse and daughter who supplements the history.  She has been in the emergency room a few times since our last visit with falls in which she hit her head.  She was there December 19 and February 2.  Notes are reviewed.  With the December 19 fall she was putting up Christmas decorations and tripped and fell and hit her head and received a scalp laceration.  This required 6 sutures.  CT brain was fortunately  unremarkable.  The February 2 fall was from bending over and trying to pick something up and continuing with forward momentum.  CT brain was unremarkable.  Her INR was elevated for her that day at about 3.3 (she usually is closer to 2.0).  She was evaluated and discharged from the emergency room.   ALLERGIES:   Allergies  Allergen Reactions   Demerol  [Meperidine ] Shortness Of Breath and Swelling   Oxycontin  [Oxycodone ] Other (See Comments)    Hallucinations    Adhesive [Tape] Other (See Comments)    Redness and skin tears   Alendronate Sodium    Cozaar [Losartan] Other (See Comments)    Dizziness    Fosamax [Alendronate] Other (See Comments)    Heartburn    Ivp Dye [Iodinated Contrast Media] Hives    OK with benadryl pre-med (50mg  one hour before receiving iodinated contrast agent)   Losartan Potassium    Meperidine  Hcl    Oxycodone  Hcl    Phenergan  [Promethazine ] Other (See Comments)    Avoids due to reaction with current medications    CURRENT MEDICATIONS:  Outpatient Encounter Medications as of 04/25/2024  Medication Sig   acetaminophen  (TYLENOL ) 325 MG tablet Take 1-2 tablets (325-650 mg total) by mouth every 4 (four) hours as needed for mild pain. (Patient taking differently: Take 500 mg by mouth every 4 (four) hours as needed for mild pain (pain score 1-3).)  carbidopa -levodopa  (SINEMET  IR) 25-100 MG tablet TAKE 2 TABLETS BY MOUTH THREE TIMES DAILY   Cholecalciferol (VITAMIN D3) 2000 units TABS Take 2,000 Units by mouth daily.    famotidine -calcium carbonate-magnesium hydroxide (PEPCID  COMPLETE) 10-800-165 MG CHEW chewable tablet Chew 1 tablet by mouth daily as needed (heartburn).   ferrous sulfate  325 (65 FE) MG EC tablet Take 325 mg by mouth 3 (three) times a week. Mon Wed Fri   Fish Oil-Krill Oil (SCHIFF KRILL & FISH OIL BLEND) CAPS Take 1 capsule by mouth daily at 12 noon.   furosemide  (LASIX ) 20 MG tablet Take 1 tablet by mouth once daily   melatonin 5 MG TABS Take 5 mg  by mouth at bedtime.   Multiple Vitamin (MULTIVITAMIN) tablet Take 1 tablet by mouth daily at 12 noon.   Multiple Vitamins-Minerals (PRESERVISION AREDS 2) CAPS Take 1 capsule by mouth 2 (two) times daily.   naphazoline-glycerin  (CLEAR EYES REDNESS) 0.012-0.25 % SOLN Place 1-2 drops into both eyes 4 (four) times daily as needed for eye irritation.   NON FORMULARY Robitussin Dm half of a cap full   OVER THE COUNTER MEDICATION Take 1 capsule by mouth daily. Neuriva   pantoprazole  (PROTONIX ) 40 MG tablet Take 40 mg by mouth daily.   Probiotic Product (PROBIOTIC PO) Take 1 capsule by mouth daily.   simvastatin  (ZOCOR ) 20 MG tablet Take 20 mg by mouth every evening.   tamsulosin  (FLOMAX ) 0.4 MG CAPS capsule    vitamin B-12 (CYANOCOBALAMIN) 1000 MCG tablet Take 1,000 mcg by mouth daily.   warfarin (COUMADIN ) 5 MG tablet 5 mg daily on Monday, Wednesday, Friday and Sunday 2.5 mg daily on Tuesday, Thursday and Saturday (Patient taking differently: 2.5mg  Wed, 5 mg other days)   No facility-administered encounter medications on file as of 04/25/2024.    Objective:   PHYSICAL EXAMINATION:    VITALS:   There were no vitals filed for this visit.      GEN:  The patient appears stated age and is in NAD. HEENT:  Normocephalic, AT.   Pt wearing helmet.  The mucous membranes are moist. The superficial temporal arteries are without ropiness or tenderness.  She is wearing a soft helmet.   Neurological examination:  Orientation: The patient is alert and oriented x3. Cranial nerves: There is good facial symmetry with facial hypomimia.  Eyes are closed intermittently.  She is awake.  The speech is fluent and clear. Soft palate rises symmetrically and there is no tongue deviation. Hearing is intact to conversational tone. Sensation: Sensation is intact to light touch throughout Motor: Strength is at least antigravity x4.  Movement examination: Tone: There is normal tone in the UE/LE Abnormal movements:  rare RUE tremor today Coordination:  There is no decremation with RAM's, but she is slow Gait and Station: not tested today  I have reviewed and interpreted the following labs independently    Chemistry      Component Value Date/Time   NA 140 01/29/2024 0026   NA 141 08/10/2022 0917   K 4.1 01/29/2024 0026   CL 100 01/29/2024 0026   CO2 28 01/29/2024 0026   BUN 37 (H) 01/29/2024 0026   BUN 29 (H) 08/10/2022 0917   CREATININE 1.98 (H) 01/29/2024 0026      Component Value Date/Time   CALCIUM 9.8 01/29/2024 0026   ALKPHOS 59 03/09/2023 0604   AST 15 03/09/2023 0604   ALT 7 03/09/2023 0604   BILITOT 0.3 03/09/2023 0604   BILITOT 0.3 08/10/2022 8469  Lab Results  Component Value Date   WBC 6.4 01/29/2024   HGB 9.3 (L) 01/29/2024   HCT 29.2 (L) 01/29/2024   MCV 100.7 (H) 01/29/2024   PLT 205 01/29/2024    Lab Results  Component Value Date   TSH 3.520 08/10/2022     Total time spent on today's visit was *** minutes, including both face-to-face time and nonface-to-face time.  Time included that spent on review of records (prior notes available to me/labs/imaging if pertinent), discussing treatment and goals, answering patient's questions and coordinating care.  Cc:  Bertha Broad, MD

## 2024-04-25 ENCOUNTER — Ambulatory Visit (INDEPENDENT_AMBULATORY_CARE_PROVIDER_SITE_OTHER): Payer: Medicare HMO | Admitting: Neurology

## 2024-04-25 ENCOUNTER — Encounter: Payer: Self-pay | Admitting: Neurology

## 2024-04-25 VITALS — BP 120/74 | HR 64 | Ht 60.0 in | Wt 112.0 lb

## 2024-04-25 DIAGNOSIS — R482 Apraxia: Secondary | ICD-10-CM | POA: Diagnosis not present

## 2024-04-25 DIAGNOSIS — G231 Progressive supranuclear ophthalmoplegia [Steele-Richardson-Olszewski]: Secondary | ICD-10-CM

## 2024-05-17 DIAGNOSIS — Z7901 Long term (current) use of anticoagulants: Secondary | ICD-10-CM | POA: Diagnosis not present

## 2024-05-17 DIAGNOSIS — I480 Paroxysmal atrial fibrillation: Secondary | ICD-10-CM | POA: Diagnosis not present

## 2024-05-17 DIAGNOSIS — Z952 Presence of prosthetic heart valve: Secondary | ICD-10-CM | POA: Diagnosis not present

## 2024-06-21 DIAGNOSIS — Z952 Presence of prosthetic heart valve: Secondary | ICD-10-CM | POA: Diagnosis not present

## 2024-06-21 DIAGNOSIS — I480 Paroxysmal atrial fibrillation: Secondary | ICD-10-CM | POA: Diagnosis not present

## 2024-06-21 DIAGNOSIS — Z7901 Long term (current) use of anticoagulants: Secondary | ICD-10-CM | POA: Diagnosis not present

## 2024-07-19 DIAGNOSIS — I480 Paroxysmal atrial fibrillation: Secondary | ICD-10-CM | POA: Diagnosis not present

## 2024-07-19 DIAGNOSIS — Z7901 Long term (current) use of anticoagulants: Secondary | ICD-10-CM | POA: Diagnosis not present

## 2024-07-19 DIAGNOSIS — Z952 Presence of prosthetic heart valve: Secondary | ICD-10-CM | POA: Diagnosis not present

## 2024-08-02 DIAGNOSIS — Z952 Presence of prosthetic heart valve: Secondary | ICD-10-CM | POA: Diagnosis not present

## 2024-08-02 DIAGNOSIS — I480 Paroxysmal atrial fibrillation: Secondary | ICD-10-CM | POA: Diagnosis not present

## 2024-08-02 DIAGNOSIS — Z7901 Long term (current) use of anticoagulants: Secondary | ICD-10-CM | POA: Diagnosis not present

## 2024-08-25 ENCOUNTER — Other Ambulatory Visit: Payer: Self-pay | Admitting: Internal Medicine

## 2024-08-29 DIAGNOSIS — H353112 Nonexudative age-related macular degeneration, right eye, intermediate dry stage: Secondary | ICD-10-CM | POA: Diagnosis not present

## 2024-08-30 ENCOUNTER — Encounter: Payer: Self-pay | Admitting: Internal Medicine

## 2024-09-04 DIAGNOSIS — Z23 Encounter for immunization: Secondary | ICD-10-CM | POA: Diagnosis not present

## 2024-09-04 DIAGNOSIS — Z7901 Long term (current) use of anticoagulants: Secondary | ICD-10-CM | POA: Diagnosis not present

## 2024-09-04 DIAGNOSIS — Z952 Presence of prosthetic heart valve: Secondary | ICD-10-CM | POA: Diagnosis not present

## 2024-09-04 DIAGNOSIS — I480 Paroxysmal atrial fibrillation: Secondary | ICD-10-CM | POA: Diagnosis not present

## 2024-09-07 ENCOUNTER — Other Ambulatory Visit: Payer: Self-pay | Admitting: Internal Medicine

## 2024-09-07 DIAGNOSIS — Z1231 Encounter for screening mammogram for malignant neoplasm of breast: Secondary | ICD-10-CM

## 2024-09-23 ENCOUNTER — Other Ambulatory Visit: Payer: Self-pay | Admitting: Internal Medicine

## 2024-10-01 ENCOUNTER — Ambulatory Visit
Admission: RE | Admit: 2024-10-01 | Discharge: 2024-10-01 | Disposition: A | Discharge status: Home / Self Care | Source: Ambulatory Visit | Attending: Internal Medicine | Admitting: Internal Medicine

## 2024-10-01 DIAGNOSIS — Z1231 Encounter for screening mammogram for malignant neoplasm of breast: Secondary | ICD-10-CM | POA: Diagnosis not present

## 2024-10-03 DIAGNOSIS — H18593 Other hereditary corneal dystrophies, bilateral: Secondary | ICD-10-CM | POA: Diagnosis not present

## 2024-10-04 DIAGNOSIS — Z952 Presence of prosthetic heart valve: Secondary | ICD-10-CM | POA: Diagnosis not present

## 2024-10-04 DIAGNOSIS — I480 Paroxysmal atrial fibrillation: Secondary | ICD-10-CM | POA: Diagnosis not present

## 2024-10-04 DIAGNOSIS — Z7901 Long term (current) use of anticoagulants: Secondary | ICD-10-CM | POA: Diagnosis not present

## 2024-10-17 ENCOUNTER — Other Ambulatory Visit (HOSPITAL_BASED_OUTPATIENT_CLINIC_OR_DEPARTMENT_OTHER): Payer: Self-pay

## 2024-10-17 MED ORDER — COMIRNATY 30 MCG/0.3ML IM SUSY
0.3000 mL | PREFILLED_SYRINGE | Freq: Once | INTRAMUSCULAR | 0 refills | Status: AC
  Filled 2024-10-17: qty 0.3, 1d supply, fill #0

## 2024-10-18 ENCOUNTER — Other Ambulatory Visit (HOSPITAL_BASED_OUTPATIENT_CLINIC_OR_DEPARTMENT_OTHER): Payer: Self-pay

## 2024-10-25 ENCOUNTER — Ambulatory Visit: Attending: Internal Medicine | Admitting: Internal Medicine

## 2024-10-25 ENCOUNTER — Encounter: Payer: Self-pay | Admitting: Internal Medicine

## 2024-10-25 VITALS — BP 124/66 | HR 70 | Ht 60.0 in | Wt 116.6 lb

## 2024-10-25 DIAGNOSIS — I10 Essential (primary) hypertension: Secondary | ICD-10-CM

## 2024-10-25 DIAGNOSIS — I480 Paroxysmal atrial fibrillation: Secondary | ICD-10-CM

## 2024-10-25 DIAGNOSIS — I4892 Unspecified atrial flutter: Secondary | ICD-10-CM

## 2024-10-25 MED ORDER — METOPROLOL TARTRATE 25 MG PO TABS: 25.0000 mg | ORAL_TABLET | ORAL | 2 refills | Status: AC | PRN

## 2024-10-25 MED ORDER — FUROSEMIDE 20 MG PO TABS: 20.0000 mg | ORAL_TABLET | Freq: Every day | ORAL | 0 refills | Status: DC

## 2024-10-25 NOTE — Patient Instructions (Addendum)
 Medication Instructions:  Your physician has recommended you make the following change in your medication:  Start lopressor  25 mg as needed for heart rate over 110. May take up to 2 tablets but they will need to be taken at least 1 hour apart.  Lab Work: None ordered.  You may go to any [facility] Location for your lab work:  [facility] - [address] ([facility]) - [address] [phone] [address]  [place] - [address]   [place]  - [address]   [place] - [address] - [address] [place]  - [address] [place] [address] ([facility]   If you have labs (blood work) drawn today and your tests are completely normal, you will receive your results only by: [employer] (if you have MyChart)  If you have any lab test that is abnormal or we need to change your treatment, we will call you or send a MyChart message to review the results.  Testing/Procedures: None ordered.  Follow-Up: At [facility], you and your health needs are our priority.  As part of our continuing mission to provide you with exceptional heart care, we have created designated Provider Care Teams.  These Care Teams include your primary Cardiologist (physician) and Advanced Practice Providers (APPs -  Physician Assistants and Nurse Practitioners) who all work together to provide you with the care you need, when you need it.  Your next appointment:   1 year(s)  The format for your next appointment:   In Person  Provider:   Donnice Primus, MD or one of the following Advanced Practice Providers on your designated Care Team:   Charlies Arthur, [place] Ozell Jodie Passey, [place] Leotis Barrack, NP  Note: Remote monitoring is used to monitor your Pacemaker/ ICD from home. This monitoring reduces the number of office visits required to check your device to one time per year. It  allows us  to keep an eye on the functioning of your device to ensure it is working properly.

## 2024-10-25 NOTE — Progress Notes (Signed)
 HPI [name] returns today for followup of her PAF, LBBB, s/p AVR, with a h/o LBBB, Parkinsons like syndrome/PSP, and HTN. In the interim, she appears to be maintaining NSR. She denies chest pain or sob. She had fallen and was in the hospital. She tried to get out of her car without help. She bled in her brain. Her amio was stopped. She denies symptoms of recurrent atrial fib.  Allergies  Allergen Reactions   Demerol  [Meperidine ] Shortness Of Breath and Swelling   Oxycontin  [Oxycodone ] Other (See Comments)    Hallucinations    Adhesive [Tape] Other (See Comments)    Redness and skin tears   Alendronate Sodium    Cozaar [Losartan] Other (See Comments)    Dizziness    Fosamax [Alendronate] Other (See Comments)    Heartburn    Ivp Dye [Iodinated Contrast Media] Hives    OK with benadryl pre-med (50mg  one hour before receiving iodinated contrast agent)   Losartan Potassium    Meperidine  Hcl    Oxycodone  Hcl    Phenergan  [Promethazine ] Other (See Comments)    Avoids due to reaction with current medications     Current Outpatient Medications  Medication Sig Dispense Refill   acetaminophen  (TYLENOL ) 325 MG tablet Take 1-2 tablets (325-650 mg total) by mouth every 4 (four) hours as needed for mild pain. (Patient taking differently: Take 500 mg by mouth every 4 (four) hours as needed for mild pain (pain score 1-3).)     carbidopa -levodopa  (SINEMET  IR) 25-100 MG tablet TAKE 2 TABLETS BY MOUTH THREE TIMES DAILY 540 tablet 0   Cholecalciferol (VITAMIN D3) 2000 units TABS Take 2,000 Units by mouth daily.      famotidine -calcium carbonate-magnesium hydroxide (PEPCID  COMPLETE) 10-800-165 MG CHEW chewable tablet Chew 1 tablet by mouth daily as needed (heartburn).     ferrous sulfate  325 (65 FE) MG EC tablet Take 325 mg by mouth 3 (three) times a week. Mon Wed Fri     Fish Oil-Krill Oil (SCHIFF KRILL & FISH OIL BLEND) CAPS Take 1 capsule by mouth daily at 12 noon.     furosemide  (LASIX ) 20  MG tablet Take 1 tablet (20 mg total) by mouth daily. 90 tablet 0   melatonin 5 MG TABS Take 5 mg by mouth at bedtime.     Multiple Vitamin (MULTIVITAMIN) tablet Take 1 tablet by mouth daily at 12 noon.     Multiple Vitamins-Minerals (PRESERVISION AREDS 2) CAPS Take 1 capsule by mouth 2 (two) times daily.     naphazoline-glycerin  (CLEAR EYES REDNESS) 0.012-0.25 % SOLN Place 1-2 drops into both eyes 4 (four) times daily as needed for eye irritation.     NON FORMULARY Robitussin Dm half of a cap full     OVER THE COUNTER MEDICATION Take 1 capsule by mouth daily. Neuriva     pantoprazole  (PROTONIX ) 40 MG tablet Take 40 mg by mouth daily.     Probiotic Product (PROBIOTIC PO) Take 1 capsule by mouth daily.     simvastatin  (ZOCOR ) 20 MG tablet Take 20 mg by mouth every evening.     tamsulosin  (FLOMAX ) 0.4 MG CAPS capsule      vitamin B-12 (CYANOCOBALAMIN) 1000 MCG tablet Take 1,000 mcg by mouth daily.     warfarin (COUMADIN ) 5 MG tablet 5 mg daily on Monday, Wednesday, Friday and Sunday 2.5 mg daily on Tuesday, Thursday and Saturday (Patient taking differently: 2.5mg  Wed, 5 mg other days)     No current  facility-administered medications for this visit.     Past Medical History:  Diagnosis Date   BPV (benign positional vertigo)    Chronic neck pain    C2-3 arthropathy   CKD (chronic kidney disease), stage II    Diverticulosis of colon    GERD (gastroesophageal reflux disease)    Hiatal hernia    History of breast cancer per pt no recurrence   dx 2004-- Left breast DCIS (ER/PR+, HER2 negative) s/p  partial masectomy w/ sln bx and  chemoradiotion (completed 2004)   History of herpes zoster    History of kidney stones    HTN (hypertension)    Hyperlipidemia    Irritable bowel syndrome    LBBB (left bundle branch block)    PAF (paroxysmal atrial fibrillation) (HCC)    a. CHA2DS2VASc = 4-->coumadin .   Parkinsonism (HCC)    neurologist-  dr tat   PONV (postoperative nausea and vomiting)     S/P aortic valve replacement with prosthetic valve    a. 04/13/2006 s/p St Jude mechnical AVR for severe AS;  b. 09/2014 Echo: EF 40-45%, gr1 DD, mild AI/MR, triv TR/PR.   Squamous cell carcinoma of skin 04/06/2022   right nasal sidewall   SUI (stress urinary incontinence, female)    Symptomatic anemia    Venous varices      ROS:   All systems reviewed and negative except as noted in the HPI.   Past Surgical History:  Procedure Laterality Date   ANTERIOR AND POSTERIOR REPAIR  1990   cystocele/ rectocele   AORTIC VALVE REPLACEMENT  04/13/06   dr gerhardt   and Replacement Ascending Aorta (St. Jude mechanical prosthesis)   APPENDECTOMY  child 1950   BREAST EXCISIONAL BIOPSY Left    BREAST LUMPECTOMY Left 01/11/2003   malignant   BREAST SURGERY Left 2004   CARDIAC CATHETERIZATION  02-22-2001   dr jordan   minimal non-obstructive cad/  mild aortic stenosis and aortic root enlargement/  normal LVF, ef 65%   CARDIAC CATHETERIZATION  03-22-2006    dr jordan   no significant obstructive cad/  severe aortic stenosis and mild to moderate aortic root enlargement/  upper normal right heart pressures   CARDIOVASCULAR STRESS TEST  09-30-2011   dr jordan   lexiscan  nuclear study w/ apical thinning  vs  small prior apical infarct w/ no significant ischemia/  hypokinesis of the distal septum and apex, ef 50%   CARPAL TUNNEL RELEASE Bilateral left 09-09-2004/  right 2007   CATARACT EXTRACTION W/ INTRAOCULAR LENS  IMPLANT, BILATERAL  2015   COLONOSCOPY N/A 10/03/2014   Procedure: COLONOSCOPY;  Surgeon: Lupita FORBES Commander, MD;  [facility] ENDOSCOPY;  Service: Endoscopy;  Laterality: N/A;   CYSTOSCOPY WITH RETROGRADE PYELOGRAM, URETEROSCOPY AND STENT PLACEMENT Right 09/17/2016   Procedure: CYSTOSCOPY WITH RIGHT RETROGRADE PYELOGRAM, RIGHT URETEROSCOPY AND STENT PLACEMENT LASER LITHOTRIPSY, RIGHT STENT PLACEMENT;  Surgeon: Morene LELON Salines, MD;  Location: [facility];  Service: Urology;   Laterality: Right;   ESOPHAGEAL MANOMETRY N/A 08/12/2014   Procedure: ESOPHAGEAL MANOMETRY (EM);  Surgeon: Lupita FORBES Commander, MD;  Location: [facility] ENDOSCOPY;  Service: Endoscopy;  Laterality: N/A;   ESOPHAGOGASTRODUODENOSCOPY N/A 10/03/2014   Procedure: ESOPHAGOGASTRODUODENOSCOPY (EGD);  Surgeon: Lupita FORBES Commander, MD;  Location: [facility] ENDOSCOPY;  Service: Endoscopy;  Laterality: N/A;   ESOPHAGOGASTRODUODENOSCOPY N/A 12/08/2018   Procedure: ESOPHAGOGASTRODUODENOSCOPY (EGD);  Surgeon: Legrand Victory LITTIE DOUGLAS, MD;  Location: [facility] ENDOSCOPY;  Service: Gastroenterology;  Laterality: N/A;   EXTRACORPOREAL SHOCK WAVE  LITHOTRIPSY  1997   FOOT SURGERY Right 2013   hammertoe x2   HOLMIUM LASER APPLICATION Right 09/17/2016   Procedure: HOLMIUM LASER APPLICATION;  Surgeon: Morene LELON Salines, MD;  Location: [facility];  Service: Urology;  Laterality: Right;   PERCUTANEOUS NEPHROSTOLITHOTOMY  1993   STONE EXTRACTION WITH BASKET Right 09/17/2016   Procedure: STONE EXTRACTION WITH BASKET;  Surgeon: Morene LELON Salines, MD;  Location: [facility];  Service: Urology;  Laterality: Right;   TOTAL ABDOMINAL HYSTERECTOMY  1971   w/ Bilateral Salpingoophorectomy   TRANSTHORACIC ECHOCARDIOGRAM  10/24/2014   dr jordan   hypokinesis of the basal and mid inferoseptal and inferior walls,  ef 40-45%,  grade 1 diastolic dysfunction/  mechanical bileaflet aortic valve noted w/ mild regur., peak grandient , valve area 1.35cm^2/  severe MV calcification without stenosis w/ mild regurg., peak gradient 60mmHg/  mild LAE/  poss. atrial mass (MRI showed benign lipomatous hypertrophy of atrial septum) consuello PR/mild TR     Family History  Problem Relation Age of Onset   Heart disease Mother    Heart disease Father 47       CABG   Other Brother        AVR   Healthy Child      Social History   Socioeconomic History   Marital status: Married    Spouse name: Not on file   Number of children: 3   Years  of education: 11   Highest education level: 11th grade  Occupational History   Occupation: Chartered Loss Adjuster: OTHER    Comment: retired  Tobacco Use   Smoking status: Never   Smokeless tobacco: Never  Vaping Use   Vaping status: Never Used  Substance and Sexual Activity   Alcohol  use: No    Alcohol /week: 0.0 standard drinks of alcohol    Drug use: No   Sexual activity: Not on file  Other Topics Concern   Not on file  Social History Narrative      1/2 -1 soda a day     Right handed   Two story home   lives with spouse   Social Drivers of Corporate Investment Banker Strain: Not on file  Food Insecurity: Not on file  Transportation Needs: Not on file  Physical Activity: Not on file  Stress: Not on file  Social Connections: Not on file  Intimate Partner Violence: Not on file     BP 124/66   Pulse 70   Ht 5' (1.524 m)   Wt 116 lb 9.6 oz (52.9 kg)   SpO2 93%   BMI 22.77 kg/m   Physical Exam:  elderly appearing NAD, wearing a helmet HEENT: Unremarkable Neck:  No JVD, no thyromegally Lymphatics:  No adenopathy Back:  No CVA tenderness Lungs:  Clear with no wheezes HEART:  IRegular rate rhythm, no murmurs, no rubs, no clicks Abd:  soft, positive bowel sounds, no organomegally, no rebound, no guarding Ext:  2 plus pulses, no edema, no cyanosis, no clubbing Skin:  No rashes no nodules Neuro:  CN II through XII intact, motor grossly intact  EKG - atrial fib with a controlled VR  DEVICE  Normal device function.  See PaceArt for details.   A/P: PAF - she is now in chronic atrial fib. Her rates are mostly controlled. I will prescribe her short acting lopressor .  2. HTN - her blood pressure log has been reviewed and she is in the 120-130 mostly.  3. Coags - she will continue warfarin.No change. 4. LV dysfunction and LBBB - she is asymptomatic. I discussed the warning symptoms and if she is not passing out or having CHF, no indication for a biv PPM. 5. PSP  - this is her biggest problem and she is wearing a helmet to prevent injury from falls.    Danelle Glender [place],MD

## 2024-11-02 NOTE — Progress Notes (Signed)
 Assessment/Plan:   1.  PSP  -Patient understands differences between PSP and Parkinson's disease.  Understands that there is an increased risk of falls, aspiration as subsequent morbidity and mortality from this disease compared to Parkinson's disease.    - She knows that I do not want her walking at all, unless somebody is directly holding her  -off of carbidopa /levodopa  now.  Insomnia did improve when we discontinued, but also PSP is generally nonresponsive to levodopa .  -discussed various studies with psp and why these are so difficult   2.  Neck pain with cervical dystonia  -This is actually improved with levodopa  therapy but remained good after we d/c it  -Her last MRI was in 2017 with evidence of severe C2/C3 left facet arthropathy with joint effusion and bone marrow edema.  She received injections at the time and pain improved.  3.  Insomnia  -can increase melatonin if needed but doing better with lower levodopa  dose   4.  A-fib  -on coumadin .  Cardiologist said that Coumadin  could not be stopped because she had a mechanical valve, but he did recommend lower target range of INR.  However, he said PCP is monitoring/managing that.  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  Eyelid opening apraxia  -not interested in the botox.  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  She finds it less bothersome in dim light.    7.  F/u 9 months-1 year  Subjective:   [name] was seen today in follow up for PSP.  My previous records were reviewed prior to todays visit as well as outside records available to me.  This patient is accompanied in the office by her husband who supplements the history.  Balance continues to be an issue and she has had falls - little ones.  If she has the gait belt and they use it, she has never fallen.  Her appetite is good.  No choking.  Vision is not good but she has new glasses (no prisms.  She goes to [facility].   She is  sleeping better now that she is off of carbidopa /levodopa  25/100.    She saw cardiology October 30.  She has to be on Coumadin  because of a mechanical valve.  ALLERGIES:   Allergies  Allergen Reactions   Demerol  [Meperidine ] Shortness Of Breath and Swelling   Oxycontin  [Oxycodone ] Other (See Comments)    Hallucinations    Adhesive [Tape] Other (See Comments)    Redness and skin tears   Alendronate Sodium    Cozaar [Losartan] Other (See Comments)    Dizziness    Fosamax [Alendronate] Other (See Comments)    Heartburn    Ivp Dye [Iodinated Contrast Media] Hives    OK with benadryl pre-med (50mg  one hour before receiving iodinated contrast agent)   Losartan Potassium    Meperidine  Hcl    Oxycodone  Hcl    Phenergan  [Promethazine ] Other (See Comments)    Avoids due to reaction with current medications    CURRENT MEDICATIONS:  Outpatient Encounter Medications as of 11/06/2024  Medication Sig   acetaminophen  (TYLENOL ) 325 MG tablet Take 1-2 tablets (325-650 mg total) by mouth every 4 (four) hours as needed for mild pain. (Patient taking differently: Take 500 mg by mouth every 4 (four) hours as needed for mild pain (pain score 1-3).)   Cholecalciferol (VITAMIN D3) 2000 units TABS Take 2,000 Units by mouth daily.    famotidine -calcium carbonate-magnesium hydroxide (  PEPCID  COMPLETE) 10-800-165 MG CHEW chewable tablet Chew 1 tablet by mouth daily as needed (heartburn).   ferrous sulfate  325 (65 FE) MG EC tablet Take 325 mg by mouth 3 (three) times a week. Mon Wed Fri   Fish Oil-Krill Oil (SCHIFF KRILL & FISH OIL BLEND) CAPS Take 1 capsule by mouth daily at 12 noon.   furosemide  (LASIX ) 20 MG tablet Take 1 tablet (20 mg total) by mouth daily.   melatonin 5 MG TABS Take 5 mg by mouth at bedtime.   metoprolol  tartrate (LOPRESSOR ) 25 MG tablet Take 1 tablet (25 mg total) by mouth as needed. For heart rate over 110. Make take up to 2 at least 1 hour apart.   Multiple Vitamin (MULTIVITAMIN) tablet  Take 1 tablet by mouth daily at 12 noon.   Multiple Vitamins-Minerals (PRESERVISION AREDS 2) CAPS Take 1 capsule by mouth 2 (two) times daily.   naphazoline-glycerin  (CLEAR EYES REDNESS) 0.012-0.25 % SOLN Place 1-2 drops into both eyes 4 (four) times daily as needed for eye irritation.   NON FORMULARY Robitussin Dm half of a cap full   OVER THE COUNTER MEDICATION Take 1 capsule by mouth daily. Neuriva   pantoprazole  (PROTONIX ) 40 MG tablet Take 40 mg by mouth daily.   Probiotic Product (PROBIOTIC PO) Take 1 capsule by mouth daily.   simvastatin  (ZOCOR ) 20 MG tablet Take 20 mg by mouth every evening.   tamsulosin  (FLOMAX ) 0.4 MG CAPS capsule    vitamin B-12 (CYANOCOBALAMIN) 1000 MCG tablet Take 1,000 mcg by mouth daily.   warfarin (COUMADIN ) 5 MG tablet 5 mg daily on Monday, Wednesday, Friday and Sunday 2.5 mg daily on Tuesday, Thursday and Saturday (Patient taking differently: 2.5mg  Wed, 5 mg other days)   [DISCONTINUED] carbidopa -levodopa  (SINEMET  IR) 25-100 MG tablet TAKE 2 TABLETS BY MOUTH THREE TIMES DAILY (Patient not taking: Reported on 11/06/2024)   No facility-administered encounter medications on file as of 11/06/2024.    Objective:   PHYSICAL EXAMINATION:    VITALS:   Vitals:   11/06/24 0757  BP: 132/84  Pulse: 68  SpO2: 98%  Weight: 117 lb 3.2 oz (53.2 kg)  Height: 4' 11 (1.499 m)    GEN:  The patient appears stated age and is in NAD. HEENT:  Normocephalic, AT.   Pt wearing helmet.  The mucous membranes are moist. The superficial temporal arteries are without ropiness or tenderness.  She is wearing a soft helmet.   Neurological examination:  Orientation: The patient is alert and oriented x3. Cranial nerves: There is good facial symmetry with facial hypomimia.  Eyes are closed much of the visit.  She is awake.  The speech is fluent and clear. Soft palate rises symmetrically and there is no tongue deviation. Hearing is intact to conversational tone. Sensation:  Sensation is intact to light touch throughout Motor: Strength is at least antigravity x4.  Movement examination: Tone: There is normal tone in the UE/LE Abnormal movements: no tremor Coordination:  There is no decremation with RAM's, but she is slow Gait and Station: ambulates with gait belt with examiner holding.  Unstable and ataxic.    I have reviewed and interpreted the following labs independently    Chemistry      Component Value Date/Time   NA 140 01/29/2024 0026   NA 141 08/10/2022 0917   K 4.1 01/29/2024 0026   CL 100 01/29/2024 0026   CO2 28 01/29/2024 0026   BUN 37 (H) 01/29/2024 0026   BUN 29 (H)  08/10/2022 0917   CREATININE 1.98 (H) 01/29/2024 0026      Component Value Date/Time   CALCIUM 9.8 01/29/2024 0026   ALKPHOS 59 03/09/2023 0604   AST 15 03/09/2023 0604   ALT 7 03/09/2023 0604   BILITOT 0.3 03/09/2023 0604   BILITOT 0.3 08/10/2022 0917       Lab Results  Component Value Date   WBC 6.4 01/29/2024   HGB 9.3 (L) 01/29/2024   HCT 29.2 (L) 01/29/2024   MCV 100.7 (H) 01/29/2024   PLT 205 01/29/2024    Lab Results  Component Value Date   TSH 3.520 08/10/2022   Total time spent on today's visit was 31 minutes, including both face-to-face time and nonface-to-face time.  Time included that spent on review of records (prior notes available to me/labs/imaging if pertinent), discussing treatment and goals, answering patient's questions and coordinating care.    Cc:  Yolande Toribio MATSU, MD

## 2024-11-06 ENCOUNTER — Encounter: Payer: Self-pay | Admitting: Neurology

## 2024-11-06 ENCOUNTER — Ambulatory Visit (INDEPENDENT_AMBULATORY_CARE_PROVIDER_SITE_OTHER): Admitting: Neurology

## 2024-11-06 VITALS — BP 132/84 | HR 68 | Ht 59.0 in | Wt 117.2 lb

## 2024-11-06 DIAGNOSIS — G231 Progressive supranuclear ophthalmoplegia [Steele-Richardson-Olszewski]: Secondary | ICD-10-CM | POA: Diagnosis not present

## 2024-11-08 DIAGNOSIS — Z952 Presence of prosthetic heart valve: Secondary | ICD-10-CM | POA: Diagnosis not present

## 2024-11-08 DIAGNOSIS — Z7901 Long term (current) use of anticoagulants: Secondary | ICD-10-CM | POA: Diagnosis not present

## 2024-11-08 DIAGNOSIS — D649 Anemia, unspecified: Secondary | ICD-10-CM | POA: Diagnosis not present

## 2024-11-08 DIAGNOSIS — I480 Paroxysmal atrial fibrillation: Secondary | ICD-10-CM | POA: Diagnosis not present

## 2024-11-15 DIAGNOSIS — R82998 Other abnormal findings in urine: Secondary | ICD-10-CM | POA: Diagnosis not present

## 2024-11-24 ENCOUNTER — Other Ambulatory Visit: Payer: Self-pay | Admitting: Internal Medicine

## 2024-11-28 ENCOUNTER — Telehealth: Payer: Self-pay | Admitting: Internal Medicine

## 2024-11-28 MED ORDER — FUROSEMIDE 20 MG PO TABS: 20.0000 mg | ORAL_TABLET | Freq: Every day | ORAL | 3 refills | Status: DC

## 2024-11-28 NOTE — Telephone Encounter (Signed)
 Requested Prescriptions   Signed Prescriptions Disp Refills   furosemide  (LASIX ) 20 MG tablet 90 tablet 3    Sig: Take 1 tablet (20 mg total) by mouth daily.    Authorizing Provider: WADDELL DANELLE ORN    Ordering User: WILFRED, Kodie Pick  C

## 2024-11-28 NOTE — Telephone Encounter (Signed)
*  STAT* If patient is at the pharmacy, call can be transferred to refill team.   1. Which medications need to be refilled? (please list name of each medication and dose if known) furosemide  (LASIX ) 20 MG tablet    2. Would you like to learn more about the convenience, safety, & potential cost savings by using the [facility]? NO    3. Are you open to using the [facility]? NO  4. Which pharmacy/location (including street and city if local pharmacy) is medication to be sent to?  [id number] - [place] ([place]), [place] - [address]       5. Do they need a 30 day or 90 day supply? 90

## 2024-11-30 ENCOUNTER — Other Ambulatory Visit: Payer: Self-pay

## 2024-11-30 ENCOUNTER — Emergency Department (HOSPITAL_COMMUNITY): Admission: EM | Admit: 2024-11-30 | Discharge: 2024-11-30 | Disposition: A | Discharge status: Home / Self Care

## 2024-11-30 ENCOUNTER — Encounter (HOSPITAL_COMMUNITY): Payer: Self-pay | Admitting: Emergency Medicine

## 2024-11-30 ENCOUNTER — Emergency Department (HOSPITAL_COMMUNITY)

## 2024-11-30 DIAGNOSIS — S0083XA Contusion of other part of head, initial encounter: Secondary | ICD-10-CM | POA: Diagnosis not present

## 2024-11-30 DIAGNOSIS — I5040 Unspecified combined systolic (congestive) and diastolic (congestive) heart failure: Secondary | ICD-10-CM | POA: Insufficient documentation

## 2024-11-30 DIAGNOSIS — M47816 Spondylosis without myelopathy or radiculopathy, lumbar region: Secondary | ICD-10-CM | POA: Diagnosis not present

## 2024-11-30 DIAGNOSIS — S6291XA Unspecified fracture of right wrist and hand, initial encounter for closed fracture: Secondary | ICD-10-CM | POA: Diagnosis not present

## 2024-11-30 DIAGNOSIS — S199XXA Unspecified injury of neck, initial encounter: Secondary | ICD-10-CM | POA: Diagnosis not present

## 2024-11-30 DIAGNOSIS — M19041 Primary osteoarthritis, right hand: Secondary | ICD-10-CM | POA: Diagnosis not present

## 2024-11-30 DIAGNOSIS — S5011XA Contusion of right forearm, initial encounter: Secondary | ICD-10-CM | POA: Diagnosis not present

## 2024-11-30 DIAGNOSIS — I110 Hypertensive heart disease with heart failure: Secondary | ICD-10-CM | POA: Insufficient documentation

## 2024-11-30 DIAGNOSIS — W19XXXA Unspecified fall, initial encounter: Secondary | ICD-10-CM

## 2024-11-30 DIAGNOSIS — S51812A Laceration without foreign body of left forearm, initial encounter: Secondary | ICD-10-CM | POA: Insufficient documentation

## 2024-11-30 DIAGNOSIS — S51811A Laceration without foreign body of right forearm, initial encounter: Secondary | ICD-10-CM | POA: Insufficient documentation

## 2024-11-30 DIAGNOSIS — Z9181 History of falling: Secondary | ICD-10-CM | POA: Diagnosis not present

## 2024-11-30 DIAGNOSIS — M19031 Primary osteoarthritis, right wrist: Secondary | ICD-10-CM | POA: Diagnosis not present

## 2024-11-30 DIAGNOSIS — Y92009 Unspecified place in unspecified non-institutional (private) residence as the place of occurrence of the external cause: Secondary | ICD-10-CM | POA: Insufficient documentation

## 2024-11-30 DIAGNOSIS — Z043 Encounter for examination and observation following other accident: Secondary | ICD-10-CM | POA: Diagnosis not present

## 2024-11-30 DIAGNOSIS — S0990XA Unspecified injury of head, initial encounter: Secondary | ICD-10-CM | POA: Diagnosis not present

## 2024-11-30 DIAGNOSIS — W1839XA Other fall on same level, initial encounter: Secondary | ICD-10-CM | POA: Insufficient documentation

## 2024-11-30 LAB — BASIC METABOLIC PANEL WITH GFR
Anion gap: 11 (ref 5–15)
BUN: 38 mg/dL — ABNORMAL HIGH (ref 8–23)
CO2: 28 mmol/L (ref 22–32)
Calcium: 11.5 mg/dL — ABNORMAL HIGH (ref 8.9–10.3)
Chloride: 103 mmol/L (ref 98–111)
Creatinine, Ser: 2.07 mg/dL — ABNORMAL HIGH (ref 0.44–1.00)
GFR, Estimated: 23 mL/min — ABNORMAL LOW (ref >60)
Glucose, Bld: 101 mg/dL — ABNORMAL HIGH (ref 70–99)
Potassium: 4.3 mmol/L (ref 3.5–5.1)
Sodium: 141 mmol/L (ref 135–145)

## 2024-11-30 LAB — PROTIME-INR
INR: 2.1 — ABNORMAL HIGH (ref 0.8–1.2)
Prothrombin Time: 24.2 s — ABNORMAL HIGH (ref 11.4–15.2)

## 2024-11-30 LAB — CBC WITH DIFFERENTIAL/PLATELET
Abs Immature Granulocytes: 0.11 K/uL — ABNORMAL HIGH (ref 0.00–0.07)
Basophils Absolute: 0 K/uL (ref 0.0–0.1)
Basophils Relative: 1 %
Eosinophils Absolute: 0 K/uL (ref 0.0–0.5)
Eosinophils Relative: 0 %
HCT: 34.3 % — ABNORMAL LOW (ref 36.0–46.0)
Hemoglobin: 11 g/dL — ABNORMAL LOW (ref 12.0–15.0)
Immature Granulocytes: 1 %
Lymphocytes Relative: 8 %
Lymphs Abs: 0.6 K/uL — ABNORMAL LOW (ref 0.7–4.0)
MCH: 33.6 pg (ref 26.0–34.0)
MCHC: 32.1 g/dL (ref 30.0–36.0)
MCV: 104.9 fL — ABNORMAL HIGH (ref 80.0–100.0)
Monocytes Absolute: 0.5 K/uL (ref 0.1–1.0)
Monocytes Relative: 6 %
Neutro Abs: 7 K/uL (ref 1.7–7.7)
Neutrophils Relative %: 84 %
Platelets: 181 K/uL (ref 150–400)
RBC: 3.27 MIL/uL — ABNORMAL LOW (ref 3.87–5.11)
RDW: 13.2 % (ref 11.5–15.5)
WBC: 8.3 K/uL (ref 4.0–10.5)
nRBC: 0 % (ref 0.0–0.2)

## 2024-11-30 LAB — APTT: aPTT: 32 s (ref 24–36)

## 2024-11-30 NOTE — Discharge Instructions (Signed)
 Please follow-up with your primary care provider over the next 2 weeks to have the wounds redressed, otherwise continue to keep the wounds clean and dry, if you begin to notice increasing redness, swelling, or pus coming from the wounds, come back to the emergency department for treatment of infection.

## 2024-11-30 NOTE — ED Provider Notes (Signed)
 [place] EMERGENCY DEPARTMENT AT [facility] Provider Note   CSN: [id number] Arrival date & time: 11/30/24  9442     Patient presents with: [name] is a 83 y.o. female who comes to the ED after having a fall at home.  It was unwitnessed, happened at approximately 3:30 AM, patient is 100% assist at baseline and attempted to get up and use the bathroom when she suddenly became weak and fell forward onto the floor.  She denies any loss of consciousness, denies any dizziness.  She does have wounds to the right side of the face, as well as a hematoma, and skin tears on the bilateral forearms.  This was controlled with direct pressure prior to arrival and she was brought by family members to the ED by POV.  It is noted this patient does take Coumadin  which she takes chronically for atrial fibrillation.  Review of previous medical records does show 2 previous falls within the last 12 months, his unsteady with walking at baseline secondary to progressive supranuclear palsy.  She also has a history of hypertension, combined systolic and diastolic heart failure, previous traumatic subdural hematoma from previous fall, and aortic valvular disease.  Review of most recent echocardiogram was performed in June 2021.  At that time that showed worsening LVEF at 30 to 35%.  She does have a aortic valve prosthetic.    Fall       Prior to Admission medications   Medication Sig Start Date End Date Taking? Authorizing Provider  acetaminophen  (TYLENOL ) 325 MG tablet Take 1-2 tablets (325-650 mg total) by mouth every 4 (four) hours as needed for mild pain. Patient taking differently: Take 500 mg by mouth every 4 (four) hours as needed for mild pain (pain score 1-3). 03/16/23   Setzer, Sandra J, PA-C  Cholecalciferol (VITAMIN D3) 2000 units TABS Take 2,000 Units by mouth daily.     [provider]  famotidine -calcium carbonate-magnesium hydroxide (PEPCID  COMPLETE)  10-800-165 MG CHEW chewable tablet Chew 1 tablet by mouth daily as needed (heartburn).    [provider]  ferrous sulfate  325 (65 FE) MG EC tablet Take 325 mg by mouth 3 (three) times a week. Mon Wed Fri    [provider]  Fish Oil-Krill Oil (SCHIFF KRILL & FISH OIL BLEND) CAPS Take 1 capsule by mouth daily at 12 noon.    [provider]  furosemide  (LASIX ) 20 MG tablet Take 1 tablet (20 mg total) by mouth daily. 11/28/24   Waddell Danelle ORN, MD  melatonin 5 MG TABS Take 5 mg by mouth at bedtime.    [provider]  metoprolol  tartrate (LOPRESSOR ) 25 MG tablet Take 1 tablet (25 mg total) by mouth as needed. For heart rate over 110. Make take up to 2 at least 1 hour apart. 10/25/24 01/23/25  Waddell Danelle ORN, MD  Multiple Vitamin (MULTIVITAMIN) tablet Take 1 tablet by mouth daily at 12 noon.    [provider]  Multiple Vitamins-Minerals (PRESERVISION AREDS 2) CAPS Take 1 capsule by mouth 2 (two) times daily.    [provider]  naphazoline-glycerin  (CLEAR EYES REDNESS) 0.012-0.25 % SOLN Place 1-2 drops into both eyes 4 (four) times daily as needed for eye irritation. 03/16/23   Setzer, Nena PARAS, PA-C  NON FORMULARY Robitussin Dm half of a cap full    [provider]  OVER THE COUNTER MEDICATION Take 1 capsule by mouth daily. Neuriva    [provider]  pantoprazole  (PROTONIX ) 40 MG tablet Take 40 mg by mouth daily. 06/07/17   [provider]  Probiotic Product (PROBIOTIC PO) Take 1 capsule by mouth daily.    [provider]  simvastatin  (ZOCOR ) 20 MG tablet Take 20 mg by mouth every evening.    [provider]  tamsulosin  (FLOMAX ) 0.4 MG CAPS capsule     [provider]  vitamin B-12 (CYANOCOBALAMIN) 1000 MCG tablet Take 1,000 mcg by mouth daily.    [provider]  warfarin (COUMADIN ) 5 MG tablet 5 mg daily on Monday, Wednesday, Friday and Sunday 2.5 mg daily on Tuesday, Thursday and  Saturday Patient taking differently: 2.5mg  Wed, 5 mg other days 03/16/23   Setzer, Sandra J, PA-C    Allergies: Demerol  [meperidine ], Oxycontin  [oxycodone ], Adhesive [tape], Alendronate sodium, Cozaar [losartan], Fosamax [alendronate], Ivp dye [iodinated contrast media], Losartan potassium, Meperidine  hcl, Oxycodone  hcl, and Phenergan  [promethazine ]    Review of Systems  Musculoskeletal:  Positive for neck pain.  Skin:  Positive for wound.  All other systems reviewed and are negative.   Updated Vital Signs BP (!) 148/83 (BP Location: Left Arm)   Pulse 85   Temp 98.6 F (37 C) (Oral)   Resp 18   SpO2 90%   Physical Exam Vitals and nursing note reviewed.  Constitutional:      General: She is awake. She is not in acute distress.    Appearance: Normal appearance. She is well-developed, well-groomed and normal weight.  HENT:     Head: Normocephalic. Contusion and laceration present.      Comments: Contusion and mild laceration to the right side of the face is noted.    Right Ear: Tympanic membrane normal.     Left Ear: Tympanic membrane normal.     Nose: Nose normal.     Mouth/Throat:     Mouth: Mucous membranes are moist.     Pharynx: Oropharynx is clear. Uvula midline.     Comments: No noted oral trauma Eyes:     General: Lids are normal. Vision grossly intact. Gaze aligned appropriately.     Extraocular Movements: Extraocular movements intact.     Conjunctiva/sclera: Conjunctivae normal.     Pupils: Pupils are equal, round, and reactive to light.  Neck:     Trachea: Trachea and phonation normal.     Comments: Tenderness throughout the C-spine, midline tenderness. Cardiovascular:     Rate and Rhythm: Normal rate and regular rhythm.     Pulses: Normal pulses.     Heart sounds: S1 normal and S2 normal. Murmur heard.     Crescendo decrescendo systolic murmur is present with a grade of 2/6.     No friction rub. No gallop.  Pulmonary:     Effort: Pulmonary effort is normal.      Breath sounds: Normal breath sounds and air entry.  Chest:     Comments: Full chest excursion with no crepitus or deformities appreciated. Abdominal:     General: Abdomen is flat. Bowel sounds are normal.     Palpations: Abdomen is soft.     Tenderness: There is no abdominal tenderness.  Musculoskeletal:        General: Normal range of motion.     Cervical back: Normal range of motion and neck supple. Spinous process tenderness present.     Right lower leg: No edema.     Left lower leg: No edema.  Skin:    General: Skin is warm and dry.  Capillary Refill: Capillary refill takes less than 2 seconds.     Findings: Laceration present.     Comments: Multiple skin tears to the forearms bilaterally.  Bleeding controlled on assessment.  Neurological:     General: No focal deficit present.     Mental Status: She is alert and oriented to person, place, and time. Mental status is at baseline.     GCS: GCS eye subscore is 4. GCS verbal subscore is 5. GCS motor subscore is 6.  Psychiatric:        Mood and Affect: Mood normal.        Behavior: Behavior is cooperative.     (all labs ordered are listed, but only abnormal results are displayed) Labs Reviewed  PROTIME-INR - Abnormal; Notable for the following components:      Result Value   Prothrombin Time 24.2 (*)    INR 2.1 (*)    All other components within normal limits  CBC WITH DIFFERENTIAL/PLATELET - Abnormal; Notable for the following components:   RBC 3.27 (*)    Hemoglobin 11.0 (*)    HCT 34.3 (*)    MCV 104.9 (*)    Lymphs Abs 0.6 (*)    Abs Immature Granulocytes 0.11 (*)    All other components within normal limits  BASIC METABOLIC PANEL WITH GFR - Abnormal; Notable for the following components:   Glucose, Bld 101 (*)    BUN 38 (*)    Creatinine, Ser 2.07 (*)    Calcium 11.5 (*)    GFR, Estimated 23 (*)    All other components within normal limits  APTT    EKG: None  Radiology: CT Head Wo Contrast Result  Date: 11/30/2024 EXAM: CT HEAD AND CERVICAL SPINE 11/30/2024 07:17:59 AM TECHNIQUE: CT of the head and cervical spine was performed without the administration of intravenous contrast. Multiplanar reformatted images are provided for review. Automated exposure control, iterative reconstruction, and/or weight based adjustment of the mA/kV was utilized to reduce the radiation dose to as low as reasonably achievable. COMPARISON: 01/29/2024. CLINICAL HISTORY: Head trauma, minor (Age >= 65y). FINDINGS: CT HEAD BRAIN AND VENTRICLES: No acute intracranial hemorrhage. No mass effect or midline shift. No abnormal extra-axial fluid collection. No evidence of acute infarct. Prominence of the sulci and ventricles compatible with brain atrophy. No hydrocephalus. ORBITS: No acute abnormality. SINUSES AND MASTOIDS: No acute abnormality. SOFT TISSUES AND SKULL: No acute skull fracture. No acute soft tissue abnormality. CT CERVICAL SPINE BONES AND ALIGNMENT: Reversal of normal cervical lordosis. The appearance is similar to the previous exam. No sign of acute posttraumatic malalignment. The vertebral body heights are all well maintained. Facet joints are aligned. DEGENERATIVE CHANGES: Multilevel endplate degenerative changes noted with disc space narrowing at C5-C6 and C6-C7. SOFT TISSUES: No prevertebral soft tissue swelling. IMPRESSION: 1. No acute intracranial abnormality. 2. No acute fracture or traumatic malalignment of the cervical spine. 3. Reversal of normal cervical lordosis, similar to the previous exam. 4. Multilevel endplate degenerative changes with disc space narrowing at C5-6 and C6-7. Electronically signed by: Waddell Calk MD 11/30/2024 07:25 AM EST RP Workstation: [id number]   CT Cervical Spine Wo Contrast Result Date: 11/30/2024 EXAM: CT HEAD AND CERVICAL SPINE 11/30/2024 07:17:59 AM TECHNIQUE: CT of the head and cervical spine was performed without the administration of intravenous contrast. Multiplanar  reformatted images are provided for review. Automated exposure control, iterative reconstruction, and/or weight based adjustment of the mA/kV was utilized to reduce the radiation dose to as low as  reasonably achievable. COMPARISON: 01/29/2024. CLINICAL HISTORY: Head trauma, minor (Age >= 65y). FINDINGS: CT HEAD BRAIN AND VENTRICLES: No acute intracranial hemorrhage. No mass effect or midline shift. No abnormal extra-axial fluid collection. No evidence of acute infarct. Prominence of the sulci and ventricles compatible with brain atrophy. No hydrocephalus. ORBITS: No acute abnormality. SINUSES AND MASTOIDS: No acute abnormality. SOFT TISSUES AND SKULL: No acute skull fracture. No acute soft tissue abnormality. CT CERVICAL SPINE BONES AND ALIGNMENT: Reversal of normal cervical lordosis. The appearance is similar to the previous exam. No sign of acute posttraumatic malalignment. The vertebral body heights are all well maintained. Facet joints are aligned. DEGENERATIVE CHANGES: Multilevel endplate degenerative changes noted with disc space narrowing at C5-C6 and C6-C7. SOFT TISSUES: No prevertebral soft tissue swelling. IMPRESSION: 1. No acute intracranial abnormality. 2. No acute fracture or traumatic malalignment of the cervical spine. 3. Reversal of normal cervical lordosis, similar to the previous exam. 4. Multilevel endplate degenerative changes with disc space narrowing at C5-6 and C6-7. Electronically signed by: Waddell Calk MD 11/30/2024 07:25 AM EST RP Workstation: [id number]   DG Pelvis Portable Result Date: 11/30/2024 EXAM: 1 or 2 VIEW(S) XRAY OF THE PELVIS 11/30/2024 07:02:00 AM COMPARISON: 02/24/2023 CLINICAL HISTORY: Tenderness FINDINGS: BONES AND JOINTS: No acute fracture. No malalignment. Degenerative changes of visualized lower lumbar spine. SOFT TISSUES: Vascular calcifications. IMPRESSION: 1. No acute findings. Electronically signed by: Waddell Calk MD 11/30/2024 07:12 AM EST RP Workstation:  [id number]   DG Hand Complete Right Result Date: 11/30/2024 EXAM: 3 or more VIEW(S) XRAY OF THE RIGHT HAND 11/30/2024 06:38:00 AM COMPARISON: Right wrist series 06/14/2023. CLINICAL HISTORY: 83 year old female with history of fall. FINDINGS: BONES AND JOINTS: Chronic ulnar styloid fracture. Degenerative changes of the first digit carpometacarpal joint. Degenerative changes at the right wrist with chondrocalcinosis. Chondrocalcinosis of the TFCC as well as calcifications along the triangular fibrocartilage complex, degenerative in etiology. No malalignment. SOFT TISSUES: Vascular calcifications. Pulse oximeter and intravenous access artifact. IMPRESSION: 1. No acute fracture or dislocation identified about the right hand. Electronically signed by: Helayne Hurst MD 11/30/2024 06:48 AM EST RP Workstation: [id number]   DG Forearm Left Result Date: 11/30/2024 EXAM: VIEW(S) XRAY OF THE LEFT FOREARM 11/30/2024 06:38:00 AM COMPARISON: None available. CLINICAL HISTORY: fall FINDINGS: FINDINGS: BONES AND JOINTS: Moderate first CMC degenerative change. Chondrocalcinosis at the TFCC. No acute fracture. No malalignment. SOFT TISSUES: Vascular calcification. IMPRESSION: 1. No acute fracture or dislocation. 2. Moderate first CMC degenerative change and chondrocalcinosis at the TFCC, which may reflect degenerative or crystal deposition arthropathy, without acute abnormality. Electronically signed by: Waddell Calk MD 11/30/2024 06:46 AM EST RP Workstation: [id number]   DG Forearm Right Result Date: 11/30/2024 EXAM: VIEW(S) XRAY OF THE LATERALITY FOREARM 11/30/2024 06:38:00 AM COMPARISON: 06/14/23. CLINICAL HISTORY: fall FINDINGS: BONES AND JOINTS: No acute fracture. No malalignment. First CMC and triscaphe degenerative change. Chondrocalcinosis with multiple, unchanged well corticated ossific densities in the ulnocarpal joints. SOFT TISSUES: Vascular calcifications. IMPRESSION: 1. No acute findings. Electronically signed by:  Waddell Calk MD 11/30/2024 06:46 AM EST RP Workstation: [id number]     Procedures   Medications Ordered in the ED - No data to display                                  Medical Decision Making Amount and/or Complexity of Data Reviewed Radiology: ordered.   Medical Decision Making:   [name] is a 83  y.o. female who presented to the ED today with multiple minor injuries after fall detailed above.    Additional history discussed with patient's family/caregivers.  External chart has been reviewed including previous labs, imaging, echocardiogram, outpatient records. Patient's presentation is complicated by their history of atrial fibrillation and PSP.  Patient placed on continuous vitals and telemetry monitoring while in ED which was reviewed periodically.  Complete initial physical exam performed, notably the patient  was alert and oriented in no apparent distress.  There are multiple superficial wounds as noted, notably multiple skin tears with bleeding controlled on the bilateral forearms, as well as a contusion with minor laceration to the right side of the face..    Reviewed and confirmed nursing documentation for past medical history, family history, social history.    Initial Assessment:   With the patient's presentation of injuries post fall, given age and comorbidities consider possible intracranial bleed, cranial fracture, C-spine fracture.  Also consider possible bony injuries to the bilateral forearms, bilateral wrist and hands, consider syncope secondary to atrial fibrillation.  There is no reported syncope and patient is not dizzy at baseline. Initial Plan:  Obtain plain film imaging of the right hand, bilateral forearms, and of the pelvis secondary to physical exam findings. Obtain CT imaging of the head and C-spine secondary to age and comorbidities as well as mechanism of injury. Screening labs including CBC and Metabolic panel to evaluate for infectious or  metabolic etiology of disease.  Secondary to warfarin utilization, assess PT/INR and APTT EKG to evaluate for cardiac pathology Objective evaluation as below reviewed   Initial Study Results:   Laboratory  All laboratory results reviewed without evidence of clinically relevant pathology.   Exceptions include: Hemoglobin is 11 however this is increased to baseline in review of previous lab results.  She has elevated creatinine of 2.07 with GFR of 23 however this again appears to be baseline for this patient.  INR is 2.1.  Patient is on Coumadin  currently.  EKG EKG was reviewed independently.  Patient has atrial fibrillation which is chronic for this patient.  Otherwise no acute abnormalities are appreciated.  ST segments without concerns for elevations.    Radiology:  All images reviewed independently. Agree with radiology report at this time.   CT Head Wo Contrast Result Date: 11/30/2024 EXAM: CT HEAD AND CERVICAL SPINE 11/30/2024 07:17:59 AM TECHNIQUE: CT of the head and cervical spine was performed without the administration of intravenous contrast. Multiplanar reformatted images are provided for review. Automated exposure control, iterative reconstruction, and/or weight based adjustment of the mA/kV was utilized to reduce the radiation dose to as low as reasonably achievable. COMPARISON: 01/29/2024. CLINICAL HISTORY: Head trauma, minor (Age >= 65y). FINDINGS: CT HEAD BRAIN AND VENTRICLES: No acute intracranial hemorrhage. No mass effect or midline shift. No abnormal extra-axial fluid collection. No evidence of acute infarct. Prominence of the sulci and ventricles compatible with brain atrophy. No hydrocephalus. ORBITS: No acute abnormality. SINUSES AND MASTOIDS: No acute abnormality. SOFT TISSUES AND SKULL: No acute skull fracture. No acute soft tissue abnormality. CT CERVICAL SPINE BONES AND ALIGNMENT: Reversal of normal cervical lordosis. The appearance is similar to the previous exam. No sign  of acute posttraumatic malalignment. The vertebral body heights are all well maintained. Facet joints are aligned. DEGENERATIVE CHANGES: Multilevel endplate degenerative changes noted with disc space narrowing at C5-C6 and C6-C7. SOFT TISSUES: No prevertebral soft tissue swelling. IMPRESSION: 1. No acute intracranial abnormality. 2. No acute fracture or traumatic malalignment of the  cervical spine. 3. Reversal of normal cervical lordosis, similar to the previous exam. 4. Multilevel endplate degenerative changes with disc space narrowing at C5-6 and C6-7. Electronically signed by: Waddell Calk MD 11/30/2024 07:25 AM EST RP Workstation: [id number]   CT Cervical Spine Wo Contrast Result Date: 11/30/2024 EXAM: CT HEAD AND CERVICAL SPINE 11/30/2024 07:17:59 AM TECHNIQUE: CT of the head and cervical spine was performed without the administration of intravenous contrast. Multiplanar reformatted images are provided for review. Automated exposure control, iterative reconstruction, and/or weight based adjustment of the mA/kV was utilized to reduce the radiation dose to as low as reasonably achievable. COMPARISON: 01/29/2024. CLINICAL HISTORY: Head trauma, minor (Age >= 65y). FINDINGS: CT HEAD BRAIN AND VENTRICLES: No acute intracranial hemorrhage. No mass effect or midline shift. No abnormal extra-axial fluid collection. No evidence of acute infarct. Prominence of the sulci and ventricles compatible with brain atrophy. No hydrocephalus. ORBITS: No acute abnormality. SINUSES AND MASTOIDS: No acute abnormality. SOFT TISSUES AND SKULL: No acute skull fracture. No acute soft tissue abnormality. CT CERVICAL SPINE BONES AND ALIGNMENT: Reversal of normal cervical lordosis. The appearance is similar to the previous exam. No sign of acute posttraumatic malalignment. The vertebral body heights are all well maintained. Facet joints are aligned. DEGENERATIVE CHANGES: Multilevel endplate degenerative changes noted with disc space  narrowing at C5-C6 and C6-C7. SOFT TISSUES: No prevertebral soft tissue swelling. IMPRESSION: 1. No acute intracranial abnormality. 2. No acute fracture or traumatic malalignment of the cervical spine. 3. Reversal of normal cervical lordosis, similar to the previous exam. 4. Multilevel endplate degenerative changes with disc space narrowing at C5-6 and C6-7. Electronically signed by: Waddell Calk MD 11/30/2024 07:25 AM EST RP Workstation: [id number]   DG Pelvis Portable Result Date: 11/30/2024 EXAM: 1 or 2 VIEW(S) XRAY OF THE PELVIS 11/30/2024 07:02:00 AM COMPARISON: 02/24/2023 CLINICAL HISTORY: Tenderness FINDINGS: BONES AND JOINTS: No acute fracture. No malalignment. Degenerative changes of visualized lower lumbar spine. SOFT TISSUES: Vascular calcifications. IMPRESSION: 1. No acute findings. Electronically signed by: Waddell Calk MD 11/30/2024 07:12 AM EST RP Workstation: [id number]   DG Hand Complete Right Result Date: 11/30/2024 EXAM: 3 or more VIEW(S) XRAY OF THE RIGHT HAND 11/30/2024 06:38:00 AM COMPARISON: Right wrist series 06/14/2023. CLINICAL HISTORY: 83 year old female with history of fall. FINDINGS: BONES AND JOINTS: Chronic ulnar styloid fracture. Degenerative changes of the first digit carpometacarpal joint. Degenerative changes at the right wrist with chondrocalcinosis. Chondrocalcinosis of the TFCC as well as calcifications along the triangular fibrocartilage complex, degenerative in etiology. No malalignment. SOFT TISSUES: Vascular calcifications. Pulse oximeter and intravenous access artifact. IMPRESSION: 1. No acute fracture or dislocation identified about the right hand. Electronically signed by: Helayne Hurst MD 11/30/2024 06:48 AM EST RP Workstation: [id number]   DG Forearm Left Result Date: 11/30/2024 EXAM: VIEW(S) XRAY OF THE LEFT FOREARM 11/30/2024 06:38:00 AM COMPARISON: None available. CLINICAL HISTORY: fall FINDINGS: FINDINGS: BONES AND JOINTS: Moderate first CMC degenerative  change. Chondrocalcinosis at the TFCC. No acute fracture. No malalignment. SOFT TISSUES: Vascular calcification. IMPRESSION: 1. No acute fracture or dislocation. 2. Moderate first CMC degenerative change and chondrocalcinosis at the TFCC, which may reflect degenerative or crystal deposition arthropathy, without acute abnormality. Electronically signed by: Waddell Calk MD 11/30/2024 06:46 AM EST RP Workstation: [id number]   DG Forearm Right Result Date: 11/30/2024 EXAM: VIEW(S) XRAY OF THE LATERALITY FOREARM 11/30/2024 06:38:00 AM COMPARISON: 06/14/23. CLINICAL HISTORY: fall FINDINGS: BONES AND JOINTS: No acute fracture. No malalignment. First CMC and triscaphe degenerative change. Chondrocalcinosis with multiple, unchanged  well corticated ossific densities in the ulnocarpal joints. SOFT TISSUES: Vascular calcifications. IMPRESSION: 1. No acute findings. Electronically signed by: Waddell Calk MD 11/30/2024 06:46 AM EST RP Workstation: [id number]    Reassessment and Plan:   Imaging is negative for this patient.  Given the negative findings on CT of the head and neck, as well as the plain film images obtained, and reassuring lab evaluation, plan at this time is to apply wound dressings to the skin tears that she has obtained on the bilateral forearms as well as to the facial contusion/laceration that was noted.  None are amenable to primary closure at this time.  Discussed care plan with the patient's family it was a voiced understanding and agreement.  Will discharge with outpatient follow-up for reevaluation of wounds, careful return precautions have been given to the family which they understand and agree.       Final diagnoses:  Contusion of face, initial encounter  Skin tear of right forearm without complication, initial encounter  Skin tear of left forearm without complication, initial encounter  Fall, initial encounter    ED Discharge Orders     None          Myriam Dorn BROCKS,  [place] 11/30/24 [id number]    Ula Prentice SAUNDERS, MD 11/30/24 [phone]

## 2024-11-30 NOTE — ED Provider Triage Note (Signed)
 Emergency Medicine Provider Triage Evaluation Note  [name] , a 83 y.o. female with PSP was evaluated in triage.  Pt complains of unwitnessed mechanical fall this a.m from standing height.  Believed to have hit her head without LOC.  Is on Coumadin .  Sustained wounds to bilateral upper extremities.  Review of Systems  Positive:  Negative:   Physical Exam  BP (!) 148/83 (BP Location: Left Arm)   Pulse 85   Temp 98.6 F (37 C) (Oral)   Resp 18   SpO2 90%  Gen:   Awake, no distress   Resp:  Normal effort  MSK:   Moves extremities without difficulty  Other:  Bilateral upper extremity wounds Alert and oriented, moves all extremity spontaneously  Medical Decision Making  Medically screening exam initiated at 6:15 AM.  Appropriate orders placed.  Landon Albertus Molt was informed that the remainder of the evaluation will be completed by another provider, this initial triage assessment does not replace that evaluation, and the importance of remaining in the ED until their evaluation is complete.     Donnajean Lynwood DEL, PA-C 11/30/24 [phone]

## 2024-11-30 NOTE — ED Triage Notes (Addendum)
 Family report patient unwitnessed fall multiple times this morning. Family was unsure if she hit her head but patient sustain a hematoma on her right forehead and large Skin tear on her bilateral arms. Bleeding controlled. Family report patient was non ambulatory but manage to get up on the bed with bed rails. Family report patient was taking Coumadin . Patient denies LOC. Patient denies dizziness , N/V.

## 2024-12-02 ENCOUNTER — Encounter: Payer: Self-pay | Admitting: Emergency Medicine

## 2024-12-02 ENCOUNTER — Other Ambulatory Visit: Payer: Self-pay

## 2024-12-02 ENCOUNTER — Ambulatory Visit: Admission: EM | Admit: 2024-12-02 | Discharge: 2024-12-02 | Disposition: A | Discharge status: Home / Self Care

## 2024-12-02 DIAGNOSIS — S51802D Unspecified open wound of left forearm, subsequent encounter: Secondary | ICD-10-CM | POA: Diagnosis not present

## 2024-12-02 DIAGNOSIS — W19XXXD Unspecified fall, subsequent encounter: Secondary | ICD-10-CM

## 2024-12-02 DIAGNOSIS — S51801D Unspecified open wound of right forearm, subsequent encounter: Secondary | ICD-10-CM | POA: Diagnosis not present

## 2024-12-02 DIAGNOSIS — E785 Hyperlipidemia, unspecified: Secondary | ICD-10-CM | POA: Insufficient documentation

## 2024-12-02 NOTE — Discharge Instructions (Addendum)
 Wounds on both forearms do appear healthy and appropriate for the duration of time they have been present.  Unfortunately the skin flaps will likely not fully take back to the skin but the wound should heal fine despite this.  Recommend using Xeroform over the wounds with just under an inch overlap to the healthy skin, cover with 4 x 4 gauze and then wrapped with a rolled gauze from the wrist to just below the elbow.  Use a small amount of tape at the top of the rolled gauze to secure it to the skin.  Recommend changing the dressing once daily although they could stay in place for 2 days if needed.  Recommend washing the wound with soap and water in the shower but do not submerge completely underwater such as tubs or swimming.  Keep follow-up appointment as scheduled with your primary care provider although if you have increased pain, purulent drainage, significant redness then return to urgent care for further evaluation.

## 2024-12-02 NOTE — ED Triage Notes (Signed)
 Pt here for wound check to skin tears to both arms and face from fall 2 days ago; pt family sts dressings slipped down

## 2024-12-02 NOTE — ED Provider Notes (Signed)
 [facility]    CSN: [id number] Arrival date & time: 12/02/24  1133      History   Chief Complaint Chief Complaint  Patient presents with   Wound Check    HPI [name] is a 83 y.o. female.   83 year old female who presents urgent care for wound check.  Her family member reports that she fell on December 5 and was treated in the emergency room.  She had several skin tears on both forearms and a small skin tear on the right temporal area.  They were initially told to leave the dressings in place for 2 weeks but the dressings are not staying in place so they were concerned and wanted to have some evaluation as to what they should do.  She is not having any fevers or chills.  She does have some pain with removing the dressing but otherwise is not complaining.   Wound Check Pertinent negatives include no chest pain, no abdominal pain and no shortness of breath.    Past Medical History:  Diagnosis Date   BPV (benign positional vertigo)    Chronic neck pain    C2-3 arthropathy   CKD (chronic kidney disease), stage II    Diverticulosis of colon    GERD (gastroesophageal reflux disease)    Hiatal hernia    History of breast cancer per pt no recurrence   dx 2004-- Left breast DCIS (ER/PR+, HER2 negative) s/p  partial masectomy w/ sln bx and  chemoradiotion (completed 2004)   History of herpes zoster    History of kidney stones    HTN (hypertension)    Hyperlipidemia    Irritable bowel syndrome    LBBB (left bundle branch block)    PAF (paroxysmal atrial fibrillation) (HCC)    a. CHA2DS2VASc = 4-->coumadin .   Parkinsonism [facility])    neurologist-  dr tat   PONV (postoperative nausea and vomiting)    S/P aortic valve replacement with prosthetic valve    a. 04/13/2006 s/p St Jude mechnical AVR for severe AS;  b. 09/2014 Echo: EF 40-45%, gr1 DD, mild AI/MR, triv TR/PR.   Squamous cell carcinoma of skin 04/06/2022   right nasal sidewall   SUI (stress urinary  incontinence, female)    Symptomatic anemia    Venous varices      Patient Active Problem List   Diagnosis Date Noted   Hyperlipidemia 12/02/2024   Neck pain 11/06/2023   Hallux valgus with bunions 05/11/2023   Hammer toe 05/11/2023   Abnormality of gait and mobility 05/04/2023   Dry eye 05/04/2023   Hammertoe of left foot 05/04/2023   TBI (traumatic brain injury) (HCC) 03/08/2023   Traumatic subdural hematoma (SDH) (HCC) 03/08/2023   Fracture of unspecified part of left clavicle, initial encounter for closed fracture 03/08/2023   Fall 02/24/2023   AVD (aortic valve disease) 02/24/2023   Pressure injury of skin 02/08/2022   Rectus sheath hematoma, initial encounter 02/05/2022   Nasal itching 05/05/2021   Paroxysmal atrial fibrillation (HCC) 04/09/2021   Progressive supranuclear palsy (HCC) 02/16/2021   Pain of right hip joint 09/15/2020   Chronic combined systolic and diastolic heart failure (HCC) 07/17/2020   PSP (progressive supranuclear palsy) (HCC) 02/29/2020   Osteopenia 02/13/2020   History of mastectomy 02/13/2020   Heart disease 02/13/2020   Hypertensive disorder 02/13/2020   Acute urinary tract infection 02/13/2020   Atrial flutter (HCC)    Heme positive stool    Symptomatic anemia 12/05/2018   GI  bleed 12/05/2018   CKD (chronic kidney disease), stage III (HCC) 12/05/2018   Elevated d-dimer 12/05/2018   SOB (shortness of breath) 12/05/2018   Supratherapeutic INR 12/05/2018   Parkinsonism (HCC)    Pain in joint of left shoulder 12/04/2018   Internal nasal lesion 10/09/2018   Knee pain 09/26/2018   Elevated troponin 09/19/2016   Emesis 09/19/2016   Sinus bradycardia 09/16/2016   Family history of colon cancer 07/30/2014   Hiatal hernia 07/30/2014   Chest pain 10/13/2011   Disequilibrium 10/13/2011   Heart valve replaced by other means 04/16/2011   Chronic anticoagulation 03/17/2011   S/P AVR (aortic valve replacement)    PVC's (premature ventricular  contractions)    HTN (hypertension)    Hypercholesteremia    Atrial fibrillation with RVR (HCC)    GERD (gastroesophageal reflux disease)    GERD 02/06/2010   DIVERTICULOSIS-COLON 02/06/2010   History of colonic polyps 02/06/2010   MASTECTOMY, HX OF 02/06/2010    Past Surgical History:  Procedure Laterality Date   ANTERIOR AND POSTERIOR REPAIR  1990   cystocele/ rectocele   AORTIC VALVE REPLACEMENT  04/13/06   dr gerhardt   and Replacement Ascending Aorta (St. Jude mechanical prosthesis)   APPENDECTOMY  child 1950   BREAST EXCISIONAL BIOPSY Left    BREAST LUMPECTOMY Left 01/11/2003   malignant   BREAST SURGERY Left 2004   CARDIAC CATHETERIZATION  02-22-2001   dr jordan   minimal non-obstructive cad/  mild aortic stenosis and aortic root enlargement/  normal LVF, ef 65%   CARDIAC CATHETERIZATION  03-22-2006    dr jordan   no significant obstructive cad/  severe aortic stenosis and mild to moderate aortic root enlargement/  upper normal right heart pressures   CARDIOVASCULAR STRESS TEST  09-30-2011   dr jordan   lexiscan  nuclear study w/ apical thinning  vs  small prior apical infarct w/ no significant ischemia/  hypokinesis of the distal septum and apex, ef 50%   CARPAL TUNNEL RELEASE Bilateral left 09-09-2004/  right 2007   CATARACT EXTRACTION W/ INTRAOCULAR LENS  IMPLANT, BILATERAL  2015   COLONOSCOPY N/A 10/03/2014   Procedure: COLONOSCOPY;  Surgeon: Lupita FORBES Commander, MD;  Location: [facility] ENDOSCOPY;  Service: Endoscopy;  Laterality: N/A;   CYSTOSCOPY WITH RETROGRADE PYELOGRAM, URETEROSCOPY AND STENT PLACEMENT Right 09/17/2016   Procedure: CYSTOSCOPY WITH RIGHT RETROGRADE PYELOGRAM, RIGHT URETEROSCOPY AND STENT PLACEMENT LASER LITHOTRIPSY, RIGHT STENT PLACEMENT;  Surgeon: Morene LELON Salines, MD;  Location: [facility];  Service: Urology;  Laterality: Right;   ESOPHAGEAL MANOMETRY N/A 08/12/2014   Procedure: ESOPHAGEAL MANOMETRY (EM);  Surgeon: Lupita FORBES Commander, MD;  Location:  [facility] ENDOSCOPY;  Service: Endoscopy;  Laterality: N/A;   ESOPHAGOGASTRODUODENOSCOPY N/A 10/03/2014   Procedure: ESOPHAGOGASTRODUODENOSCOPY (EGD);  Surgeon: Lupita FORBES Commander, MD;  Location: [facility] ENDOSCOPY;  Service: Endoscopy;  Laterality: N/A;   ESOPHAGOGASTRODUODENOSCOPY N/A 12/08/2018   Procedure: ESOPHAGOGASTRODUODENOSCOPY (EGD);  Surgeon: Legrand Victory LITTIE DOUGLAS, MD;  Location: [facility] ENDOSCOPY;  Service: Gastroenterology;  Laterality: N/A;   EXTRACORPOREAL SHOCK WAVE LITHOTRIPSY  1997   FOOT SURGERY Right 2013   hammertoe x2   HOLMIUM LASER APPLICATION Right 09/17/2016   Procedure: HOLMIUM LASER APPLICATION;  Surgeon: Morene LELON Salines, MD;  Location[facility];  Service: Urology;  Laterality: Right;   PERCUTANEOUS NEPHROSTOLITHOTOMY  1993   STONE EXTRACTION WITH BASKET Right 09/17/2016   Procedure: STONE EXTRACTION WITH BASKET;  Surgeon: Morene LELON Salines, MD;  Location: [facility];  Service:  Urology;  Laterality: Right;   TOTAL ABDOMINAL HYSTERECTOMY  1971   w/ Bilateral Salpingoophorectomy   TRANSTHORACIC ECHOCARDIOGRAM  10/24/2014   dr jordan   hypokinesis of the basal and mid inferoseptal and inferior walls,  ef 40-45%,  grade 1 diastolic dysfunction/  mechanical bileaflet aortic valve noted w/ mild regur., peak grandient , valve area 1.35cm^2/  severe MV calcification without stenosis w/ mild regurg., peak gradient 72mmHg/  mild LAE/  poss. atrial mass (MRI showed benign lipomatous hypertrophy of atrial septum) consuello PR/mild TR    OB History   No obstetric history on file.      Home Medications    Prior to Admission medications   Medication Sig Start Date End Date Taking? Authorizing Provider  nitrofurantoin, macrocrystal-monohydrate, (MACROBID) 100 MG capsule Take 100 mg by mouth 2 (two) times daily. 11/28/24  Yes [provider]  potassium chloride  (KLOR-CON ) 8 MEQ tablet Take 16 mEq by mouth daily. 07/31/24  Yes [provider]   acetaminophen  (TYLENOL ) 325 MG tablet Take 1-2 tablets (325-650 mg total) by mouth every 4 (four) hours as needed for mild pain. Patient taking differently: Take 500 mg by mouth every 4 (four) hours as needed for mild pain (pain score 1-3). 03/16/23   Setzer, Nena PARAS, PA-C  carbidopa -levodopa  (SINEMET  IR) 25-250 MG tablet Take 1 tablet by mouth.    [provider]  Cholecalciferol (VITAMIN D3) 2000 units TABS Take 2,000 Units by mouth daily.     [provider]  famotidine -calcium carbonate-magnesium hydroxide (PEPCID  COMPLETE) 10-800-165 MG CHEW chewable tablet Chew 1 tablet by mouth daily as needed (heartburn).    [provider]  ferrous sulfate  325 (65 FE) MG EC tablet Take 325 mg by mouth 3 (three) times a week. Mon Wed Fri    [provider]  Fish Oil-Krill Oil (SCHIFF KRILL & FISH OIL BLEND) CAPS Take 1 capsule by mouth daily at 12 noon.    [provider]  furosemide  (LASIX ) 20 MG tablet Take 1 tablet (20 mg total) by mouth daily. 11/28/24   Waddell Danelle ORN, MD  melatonin 5 MG TABS Take 5 mg by mouth at bedtime.    [provider]  methocarbamol  (ROBAXIN ) 500 MG tablet Take 500 mg by mouth.    [provider]  metoprolol  tartrate (LOPRESSOR ) 25 MG tablet Take 1 tablet (25 mg total) by mouth as needed. For heart rate over 110. Make take up to 2 at least 1 hour apart. 10/25/24 01/23/25  Waddell Danelle ORN, MD  Multiple Vitamin (MULTIVITAMIN) tablet Take 1 tablet by mouth daily at 12 noon.    [provider]  Multiple Vitamins-Minerals (PRESERVISION AREDS 2) CAPS Take 1 capsule by mouth 2 (two) times daily.    [provider]  naphazoline-glycerin  (CLEAR EYES REDNESS) 0.012-0.25 % SOLN Place 1-2 drops into both eyes 4 (four) times daily as needed for eye irritation. 03/16/23   Setzer, Nena PARAS, PA-C  NON FORMULARY Robitussin Dm half of a cap full    [provider]  OVER THE COUNTER MEDICATION Take 1 capsule by  mouth daily. Neuriva    [provider]  pantoprazole  (PROTONIX ) 40 MG tablet Take 40 mg by mouth daily. 06/07/17   [provider]  Probiotic Product (PROBIOTIC PO) Take 1 capsule by mouth daily.    [provider]  simvastatin  (ZOCOR ) 20 MG tablet Take 20 mg by mouth every evening.    [provider]  tamsulosin  (FLOMAX ) 0.4 MG  CAPS capsule     [provider]  traMADol  (ULTRAM ) 50 MG tablet Take 50 mg by mouth.    [provider]  vitamin B-12 (CYANOCOBALAMIN) 1000 MCG tablet Take 1,000 mcg by mouth daily.    [provider]  warfarin (COUMADIN ) 5 MG tablet 5 mg daily on Monday, Wednesday, Friday and Sunday 2.5 mg daily on Tuesday, Thursday and Saturday Patient taking differently: 2.5mg  Wed, 5 mg other days 03/16/23   Rosendo Nena PARAS, PA-C    Family History Family History  Problem Relation Age of Onset   Heart disease Mother    Heart disease Father 4       CABG   Other Brother        AVR   Healthy Child     Social History Social History   Tobacco Use   Smoking status: Never   Smokeless tobacco: Never  Vaping Use   Vaping status: Never Used  Substance Use Topics   Alcohol  use: No    Alcohol /week: 0.0 standard drinks of alcohol    Drug use: No     Allergies   Demerol  [meperidine ], Oxycodone , Adhesive [tape], Alendronate sodium, Cozaar [losartan], Fosamax [alendronate], Ivp dye [iodinated contrast media], Losartan potassium, Meperidine  hcl, Oxycodone  hcl, and Phenergan  [promethazine ]   Review of Systems Review of Systems  Constitutional:  Negative for chills and fever.  HENT:  Negative for ear pain and sore throat.   Eyes:  Negative for pain and visual disturbance.  Respiratory:  Negative for cough and shortness of breath.   Cardiovascular:  Negative for chest pain and palpitations.  Gastrointestinal:  Negative for abdominal pain and vomiting.  Genitourinary:  Negative for dysuria and hematuria.   Musculoskeletal:  Negative for arthralgias and back pain.  Skin:  Positive for wound. Negative for color change and rash.  Neurological:  Negative for seizures and syncope.  All other systems reviewed and are negative.    Physical Exam Triage Vital Signs ED Triage Vitals [12/02/24 1159]  Encounter Vitals Group     BP (!) 144/79     Girls Systolic BP Percentile      Girls Diastolic BP Percentile      Boys Systolic BP Percentile      Boys Diastolic BP Percentile      Pulse Rate 71     Resp 18     Temp 97.9 F (36.6 C)     Temp Source Oral     SpO2      Weight      Height      Head Circumference      Peak Flow      Pain Score      Pain Loc      Pain Education      Exclude from Growth Chart    No data found.  Updated Vital Signs BP (!) 144/79 (BP Location: Left Arm)   Pulse 71   Temp 97.9 F (36.6 C) (Oral)   Resp 18   Visual Acuity Right Eye Distance:   Left Eye Distance:   Bilateral Distance:    Right Eye Near:   Left Eye Near:    Bilateral Near:     Physical Exam Vitals and nursing note reviewed.  Constitutional:      General: She is not in acute distress.    Appearance: She is well-developed.  HENT:     Head: Normocephalic and atraumatic.  Eyes:     Conjunctiva/sclera: Conjunctivae normal.  Cardiovascular:  Rate and Rhythm: Normal rate.  Pulmonary:     Effort: Pulmonary effort is normal. No respiratory distress.  Musculoskeletal:     Cervical back: Neck supple.  Skin:    General: Skin is warm and dry.     Capillary Refill: Capillary refill takes less than 2 seconds.      Neurological:     Mental Status: She is alert.  Psychiatric:        Mood and Affect: Mood normal.      UC Treatments / Results  Labs (all labs ordered are listed, but only abnormal results are displayed) Labs Reviewed - No data to display  EKG   Radiology No results found.  Procedures Procedures (including critical care time)  Medications Ordered in  UC Medications - No data to display  Initial Impression / Assessment and Plan / UC Course  I have reviewed the triage vital signs and the nursing notes.  Pertinent labs & imaging results that were available during my care of the patient were reviewed by me and considered in my medical decision making (see chart for details).     Wound, open, arm, forearm, left, subsequent encounter  Wound, open, arm, forearm, right, subsequent encounter  Fall, subsequent encounter   Wounds on both forearms do appear healthy and appropriate for the duration of time they have been present.  Unfortunately the skin flaps will likely not fully take back to the skin but the wound should heal fine despite this.  Recommend using Xeroform over the wounds with just under an inch overlap to the healthy skin, cover with 4 x 4 gauze and then wrapped with a rolled gauze from the wrist to just below the elbow.  Use a small amount of tape at the top of the rolled gauze to secure it to the skin.  Recommend changing the dressing once daily although they could stay in place for 2 days if needed.  Recommend washing the wound with soap and water in the shower but do not submerge completely underwater such as tubs or swimming.  Keep follow-up appointment as scheduled with your primary care provider although if you have increased pain, purulent drainage, significant redness then return to urgent care for further evaluation.  Final Clinical Impressions(s) / UC Diagnoses   Final diagnoses:  Wound, open, arm, forearm, left, subsequent encounter  Wound, open, arm, forearm, right, subsequent encounter  Fall, subsequent encounter     Discharge Instructions      Wounds on both forearms do appear healthy and appropriate for the duration of time they have been present.  Unfortunately the skin flaps will likely not fully take back to the skin but the wound should heal fine despite this.  Recommend using Xeroform over the wounds with  just under an inch overlap to the healthy skin, cover with 4 x 4 gauze and then wrapped with a rolled gauze from the wrist to just below the elbow.  Use a small amount of tape at the top of the rolled gauze to secure it to the skin.  Recommend changing the dressing once daily although they could stay in place for 2 days if needed.  Recommend washing the wound with soap and water in the shower but do not submerge completely underwater such as tubs or swimming.  Keep follow-up appointment as scheduled with your primary care provider although if you have increased pain, purulent drainage, significant redness then return to urgent care for further evaluation.     ED Prescriptions  None    PDMP not reviewed this encounter.   Teresa Almarie LABOR, PA-C 12/02/24 1241

## 2024-12-16 ENCOUNTER — Ambulatory Visit
Admission: EM | Admit: 2024-12-16 | Discharge: 2024-12-16 | Disposition: A | Discharge status: Home / Self Care | Attending: Family Medicine | Admitting: Family Medicine

## 2024-12-16 DIAGNOSIS — T148XXA Other injury of unspecified body region, initial encounter: Secondary | ICD-10-CM | POA: Diagnosis not present

## 2024-12-16 DIAGNOSIS — W19XXXD Unspecified fall, subsequent encounter: Secondary | ICD-10-CM

## 2024-12-16 NOTE — ED Triage Notes (Signed)
 Pt presents for post-fall f/u. Fall on 12/05. Was also seen at [facility] for 12/07. Daughter has been dressing wounds to bilateral arms and wants provider to check them and make sure they're ok. No fevers, redness. Dressings removed for exam.   Pt reports pain in R forearm inside the arm. Xrays were done at time of injury.

## 2024-12-16 NOTE — ED Provider Notes (Signed)
 " Producer, Television/film/video - [facility]  Note:  This document was prepared using Conservation officer, historic buildings and may include unintentional dictation errors.  MRN: [id number] DOB: Jan 07, 1941  Subjective:   Tilda Samudio is a 83 y.o. female presenting for recheck on a fall from 11/30/2024 and seen on the same day through the emergency room.  Was seen again on 12/02/2024 through the urgent care.  No fever, drainage of pus or bleeding, redness, swelling.  Wounds have been attended to at home very carefully.  Current Outpatient Medications  Medication Instructions   acetaminophen  (TYLENOL ) 325-650 mg, Oral, Every 4 hours PRN   carbidopa -levodopa  (SINEMET  IR) 25-250 MG tablet 1 tablet, Oral   cyanocobalamin (VITAMIN B12) 1,000 mcg, Daily   famotidine -calcium carbonate-magnesium hydroxide (PEPCID  COMPLETE) 10-800-165 MG CHEW chewable tablet 1 tablet, Daily PRN   ferrous sulfate  325 mg, 3 times weekly   Fish Oil-Krill Oil (SCHIFF KRILL & FISH OIL BLEND) CAPS 1 capsule, Daily   furosemide  (LASIX ) 20 mg, Oral, Daily   melatonin 5 mg, Daily at bedtime   methocarbamol  (ROBAXIN ) 500 mg, Oral   metoprolol  tartrate (LOPRESSOR ) 25 mg, Oral, As needed, For heart rate over 110. Make take up to 2 at least 1 hour apart.   Multiple Vitamin (MULTIVITAMIN) tablet 1 tablet, Daily   Multiple Vitamins-Minerals (PRESERVISION AREDS 2) CAPS 1 capsule, 2 times daily   naphazoline-glycerin  (CLEAR EYES REDNESS) 0.012-0.25 % SOLN 1-2 drops, Both Eyes, 4 times daily PRN   nitrofurantoin (macrocrystal-monohydrate) (MACROBID) 100 mg, 2 times daily   NON FORMULARY Robitussin Dm half of a cap full   OVER THE COUNTER MEDICATION 1 capsule, Daily   pantoprazole  (PROTONIX ) 40 mg, Daily   potassium chloride  (KLOR-CON ) 8 MEQ tablet 16 mEq, Daily   Probiotic Product (PROBIOTIC PO) 1 capsule, Daily   simvastatin  (ZOCOR ) 20 mg, Every evening   tamsulosin  (FLOMAX ) 0.4 MG CAPS capsule    traMADol  (ULTRAM ) 50 mg, Oral    Vitamin D3 2,000 Units, Daily   warfarin (COUMADIN ) 5 MG tablet 5 mg daily on Monday, Wednesday, Friday and Sunday 2.5 mg daily on Tuesday, Thursday and Saturday    Allergies[1]  Past Medical History:  Diagnosis Date   BPV (benign positional vertigo)    Chronic neck pain    C2-3 arthropathy   CKD (chronic kidney disease), stage II    Diverticulosis of colon    GERD (gastroesophageal reflux disease)    Hiatal hernia    History of breast cancer per pt no recurrence   dx 2004-- Left breast DCIS (ER/PR+, HER2 negative) s/p  partial masectomy w/ sln bx and  chemoradiotion (completed 2004)   History of herpes zoster    History of kidney stones    HTN (hypertension)    Hyperlipidemia    Irritable bowel syndrome    LBBB (left bundle branch block)    PAF (paroxysmal atrial fibrillation) (HCC)    a. CHA2DS2VASc = 4-->coumadin .   Parkinsonism (HCC)    neurologist-  dr tat   PONV (postoperative nausea and vomiting)    S/P aortic valve replacement with prosthetic valve    a. 04/13/2006 s/p St Jude mechnical AVR for severe AS;  b. 09/2014 Echo: EF 40-45%, gr1 DD, mild AI/MR, triv TR/PR.   Squamous cell carcinoma of skin 04/06/2022   right nasal sidewall   SUI (stress urinary incontinence, female)    Symptomatic anemia    Venous varices       Past Surgical History:  Procedure Laterality  Date   ANTERIOR AND POSTERIOR REPAIR  1990   cystocele/ rectocele   AORTIC VALVE REPLACEMENT  04/13/06   dr gerhardt   and Replacement Ascending Aorta (St. Jude mechanical prosthesis)   APPENDECTOMY  child 1950   BREAST EXCISIONAL BIOPSY Left    BREAST LUMPECTOMY Left 01/11/2003   malignant   BREAST SURGERY Left 2004   CARDIAC CATHETERIZATION  02-22-2001   dr jordan   minimal non-obstructive cad/  mild aortic stenosis and aortic root enlargement/  normal LVF, ef 65%   CARDIAC CATHETERIZATION  03-22-2006    dr jordan   no significant obstructive cad/  severe aortic stenosis and mild to moderate  aortic root enlargement/  upper normal right heart pressures   CARDIOVASCULAR STRESS TEST  09-30-2011   dr jordan   lexiscan  nuclear study w/ apical thinning  vs  small prior apical infarct w/ no significant ischemia/  hypokinesis of the distal septum and apex, ef 50%   CARPAL TUNNEL RELEASE Bilateral left 09-09-2004/  right 2007   CATARACT EXTRACTION W/ INTRAOCULAR LENS  IMPLANT, BILATERAL  2015   COLONOSCOPY N/A 10/03/2014   Procedure: COLONOSCOPY;  Surgeon: Lupita FORBES Commander, MD;  Location: [facility] ENDOSCOPY;  Service: Endoscopy;  Laterality: N/A;   CYSTOSCOPY WITH RETROGRADE PYELOGRAM, URETEROSCOPY AND STENT PLACEMENT Right 09/17/2016   Procedure: CYSTOSCOPY WITH RIGHT RETROGRADE PYELOGRAM, RIGHT URETEROSCOPY AND STENT PLACEMENT LASER LITHOTRIPSY, RIGHT STENT PLACEMENT;  Surgeon: Morene LELON Salines, MD;  Location: [facility];  Service: Urology;  Laterality: Right;   ESOPHAGEAL MANOMETRY N/A 08/12/2014   Procedure: ESOPHAGEAL MANOMETRY (EM);  Surgeon: Lupita FORBES Commander, MD;  Location: [facility] ENDOSCOPY;  Service: Endoscopy;  Laterality: N/A;   ESOPHAGOGASTRODUODENOSCOPY N/A 10/03/2014   Procedure: ESOPHAGOGASTRODUODENOSCOPY (EGD);  Surgeon: Lupita FORBES Commander, MD;  Location: [facility] ENDOSCOPY;  Service: Endoscopy;  Laterality: N/A;   ESOPHAGOGASTRODUODENOSCOPY N/A 12/08/2018   Procedure: ESOPHAGOGASTRODUODENOSCOPY (EGD);  Surgeon: Legrand Victory LITTIE DOUGLAS, MD;  Location: [facility] ENDOSCOPY;  Service: Gastroenterology;  Laterality: N/A;   EXTRACORPOREAL SHOCK WAVE LITHOTRIPSY  1997   FOOT SURGERY Right 2013   hammertoe x2   HOLMIUM LASER APPLICATION Right 09/17/2016   Procedure: HOLMIUM LASER APPLICATION;  Surgeon: Morene LELON Salines, MD;  Location: [facility];  Service: Urology;  Laterality: Right;   PERCUTANEOUS NEPHROSTOLITHOTOMY  1993   STONE EXTRACTION WITH BASKET Right 09/17/2016   Procedure: STONE EXTRACTION WITH BASKET;  Surgeon: Morene LELON Salines, MD;  Location[facility];   Service: Urology;  Laterality: Right;   TOTAL ABDOMINAL HYSTERECTOMY  1971   w/ Bilateral Salpingoophorectomy   TRANSTHORACIC ECHOCARDIOGRAM  10/24/2014   dr jordan   hypokinesis of the basal and mid inferoseptal and inferior walls,  ef 40-45%,  grade 1 diastolic dysfunction/  mechanical bileaflet aortic valve noted w/ mild regur., peak grandient , valve area 1.35cm^2/  severe MV calcification without stenosis w/ mild regurg., peak gradient 62mmHg/  mild LAE/  poss. atrial mass (MRI showed benign lipomatous hypertrophy of atrial septum) consuello PR/mild TR    Family History  Problem Relation Age of Onset   Heart disease Mother    Heart disease Father 31       CABG   Other Brother        AVR   Healthy Child     Social History   Occupational History   Occupation: Chartered Loss Adjuster: OTHER    Comment: retired  Tobacco Use   Smoking status: Never   Smokeless tobacco:  Never  Vaping Use   Vaping status: Never Used  Substance and Sexual Activity   Alcohol  use: No    Alcohol /week: 0.0 standard drinks of alcohol    Drug use: No   Sexual activity: Not on file     ROS   Objective:   Vitals: BP 137/78 (BP Location: Right Arm)   Pulse 70   Temp 97.9 F (36.6 C) (Oral)   Resp 18   SpO2 96%   Physical Exam Constitutional:      General: She is not in acute distress.    Appearance: Normal appearance. She is well-developed. She is not ill-appearing, toxic-appearing or diaphoretic.  HENT:     Head: Normocephalic and atraumatic.     Nose: Nose normal.     Mouth/Throat:     Mouth: Mucous membranes are moist.  Eyes:     General: No scleral icterus.       Right eye: No discharge.        Left eye: No discharge.     Extraocular Movements: Extraocular movements intact.  Cardiovascular:     Rate and Rhythm: Normal rate.  Pulmonary:     Effort: Pulmonary effort is normal.  Skin:    General: Skin is warm and dry.     Comments: Patient has had dramatic improvement in  her wounds from her visit on 11/30/2024.  Has granulated tissue, no weeping, erythema, tenderness.  Neurological:     General: No focal deficit present.     Mental Status: She is alert and oriented to person, place, and time.  Psychiatric:        Mood and Affect: Mood normal.        Behavior: Behavior normal.    Dressings applied to the wounds.   Assessment and Plan :   PDMP not reviewed this encounter.  1. Multiple skin tears   2. Accidental fall, subsequent encounter      Wound care performed at home.  Continue same wound care practices.      [1]  Allergies Allergen Reactions   Demerol  [Meperidine ] Shortness Of Breath and Swelling   Oxycodone  Other (See Comments)    Hallucinations  Other Reaction(s): Not available, Not available   Adhesive [Tape] Other (See Comments)    Redness and skin tears   Alendronate Sodium    Cozaar [Losartan] Other (See Comments)    Dizziness    Fosamax [Alendronate] Other (See Comments)    Heartburn    Ivp Dye [Iodinated Contrast Media] Hives    OK with benadryl pre-med (50mg  one hour before receiving iodinated contrast agent)   Losartan Potassium    Meperidine  Hcl    Oxycodone  Hcl    Phenergan  [Promethazine ] Other (See Comments)    Avoids due to reaction with current medications     Christopher Savannah, PA-C 12/19/24 [id number]  "

## 2024-12-16 NOTE — Discharge Instructions (Addendum)
 Continue dressing changes this week. If she develops fever, redness, drainage of pus then return to clinic for a recheck for wound infection.

## 2025-01-31 NOTE — Progress Notes (Unsigned)
 "    Cardiology Office Note   Date:  01/31/2025   ID:  [name] 08-Sep-1941, MRN [id number]  PCP:  Christy Toribio MATSU, MD  Cardiologist:   Sonia Bromell, MD   No chief complaint on file.     History of Present Illness: [name] is a 84 y.o. female seen for follow up Afib and s/p AVR. She has a  h/o severe AS s/p SJM mech AVR in 2007. She also has a h/o Atiral fibrillation and is chronically anticoagulated with coumadin .  She has a history of bradycardia on beta blocker.   September 2017 following a urologic procedure  she developed abd pain, nausea, c/p, sob, and palpitations, prompting her to call EMS.  She was found to be in AF with RVR, which later converted to sinus rhythm.  She was admitted to [facility] and had mild troponin elevation.  She  underwent stress testing, which was low risk.  She was seen in October 2017 by Dr Fernande who felt her Afib episodes were infrequent and recommended following for now.    She does have a history of atypical Parikinsons and progressive supranuclear palsy followed by Neurology.    Admitted 12/10-12/14/2019 for Afib RVR, chest pain, anemia (Hgb 5.6) following a fall/trauma, extensive hematoma left chest. She became volume overloaded post transfusion>>BiPAP>>diuresed, low-dose amio started for elevated HR.   Seen by Dr Waddell in March 2020 and appeared to be maintaining NSR on amiodarone . She has multiple falls.  She was seen in the ED on 09/09/19 with mechanical fall and facial laceration. CT negative for bleed. INR therapeutic.   When seen in Pacer clinic in March 2021 only noted 2 episodes of Afib lasting minutes. Seen in ED on March 29 following a fall. CT and Xrays negative.   She was seen  in the ED on 06/19/20 after a mechanical fall and scalp laceration. Head CT showed no bleeding. She complained of increased wheezing and cough. CXR showed CM with low lung volumes. No clear edema. BNP was elevated at 725. She was DC on lasix   20 mg daily and outpatient Echo ordered. This demonstrated significant decreased in EF to 30-35% down from 40-45% with global HK. AV prosthesis was functioning normally. Severe LAE. She was started on Entresto  as outpatient. This resulted in low BP, diarrhea and HA and was discontinued.    She has since been followed by Dr Waddell. It appears that she went back into Afib and has been managed with rate control and anticoagulation. No longer on amiodarone .   She still has repeated falls. Last seen in Dec. CT negative for bleed.   On follow up she is seen with her husband. He states she doesn't fall as long as she has support when she tries to get up. He is very helpful in this regard. She now has an alarm on her chair and bed to alert him if she tries to get up. No bleeding. HR controlled. Hasn't had to use metoprolol .     Past Medical History:  Diagnosis Date   BPV (benign positional vertigo)    Chronic neck pain    C2-3 arthropathy   CKD (chronic kidney disease), stage II    Diverticulosis of colon    GERD (gastroesophageal reflux disease)    Hiatal hernia    History of breast cancer per pt no recurrence   dx 2004-- Left breast DCIS (ER/PR+, HER2 negative) s/p  partial masectomy w/ sln bx and  chemoradiotion (  completed 2004)   History of herpes zoster    History of kidney stones    HTN (hypertension)    Hyperlipidemia    Irritable bowel syndrome    LBBB (left bundle branch block)    PAF (paroxysmal atrial fibrillation) (HCC)    a. CHA2DS2VASc = 4-->coumadin .   Parkinsonism [facility])    neurologist-  dr tat   PONV (postoperative nausea and vomiting)    S/P aortic valve replacement with prosthetic valve    a. 04/13/2006 s/p St Jude mechnical AVR for severe AS;  b. 09/2014 Echo: EF 40-45%, gr1 DD, mild AI/MR, triv TR/PR.   Squamous cell carcinoma of skin 04/06/2022   right nasal sidewall   SUI (stress urinary incontinence, female)    Symptomatic anemia    Venous varices      Past  Surgical History:  Procedure Laterality Date   ANTERIOR AND POSTERIOR REPAIR  1990   cystocele/ rectocele   AORTIC VALVE REPLACEMENT  04/13/06   dr gerhardt   and Replacement Ascending Aorta (St. Jude mechanical prosthesis)   APPENDECTOMY  child 1950   BREAST EXCISIONAL BIOPSY Left    BREAST LUMPECTOMY Left 01/11/2003   malignant   BREAST SURGERY Left 2004   CARDIAC CATHETERIZATION  02-22-2001   dr Micky Sheller   minimal non-obstructive cad/  mild aortic stenosis and aortic root enlargement/  normal LVF, ef 65%   CARDIAC CATHETERIZATION  03-22-2006    dr Cerina Leary   no significant obstructive cad/  severe aortic stenosis and mild to moderate aortic root enlargement/  upper normal right heart pressures   CARDIOVASCULAR STRESS TEST  09-30-2011   dr Janicia Monterrosa   lexiscan  nuclear study w/ apical thinning  vs  small prior apical infarct w/ no significant ischemia/  hypokinesis of the distal septum and apex, ef 50%   CARPAL TUNNEL RELEASE Bilateral left 09-09-2004/  right 2007   CATARACT EXTRACTION W/ INTRAOCULAR LENS  IMPLANT, BILATERAL  2015   COLONOSCOPY N/A 10/03/2014   Procedure: COLONOSCOPY;  Surgeon: Lupita FORBES Commander, MD;  Location: [facility] ENDOSCOPY;  Service: Endoscopy;  Laterality: N/A;   CYSTOSCOPY WITH RETROGRADE PYELOGRAM, URETEROSCOPY AND STENT PLACEMENT Right 09/17/2016   Procedure: CYSTOSCOPY WITH RIGHT RETROGRADE PYELOGRAM, RIGHT URETEROSCOPY AND STENT PLACEMENT LASER LITHOTRIPSY, RIGHT STENT PLACEMENT;  Surgeon: Morene LELON Salines, MD;  Location: [facility];  Service: Urology;  Laterality: Right;   ESOPHAGEAL MANOMETRY N/A 08/12/2014   Procedure: ESOPHAGEAL MANOMETRY (EM);  Surgeon: Lupita FORBES Commander, MD;  Location: [facility] ENDOSCOPY;  Service: Endoscopy;  Laterality: N/A;   ESOPHAGOGASTRODUODENOSCOPY N/A 10/03/2014   Procedure: ESOPHAGOGASTRODUODENOSCOPY (EGD);  Surgeon: Lupita FORBES Commander, MD;  Location: [facility] ENDOSCOPY;  Service: Endoscopy;  Laterality: N/A;   ESOPHAGOGASTRODUODENOSCOPY N/A  12/08/2018   Procedure: ESOPHAGOGASTRODUODENOSCOPY (EGD);  Surgeon: Legrand Victory LITTIE DOUGLAS, MD;  Location: [facility] ENDOSCOPY;  Service: Gastroenterology;  Laterality: N/A;   EXTRACORPOREAL SHOCK WAVE LITHOTRIPSY  1997   FOOT SURGERY Right 2013   hammertoe x2   HOLMIUM LASER APPLICATION Right 09/17/2016   Procedure: HOLMIUM LASER APPLICATION;  Surgeon: Morene LELON Salines, MD;  Location: [facility];  Service: Urology;  Laterality: Right;   PERCUTANEOUS NEPHROSTOLITHOTOMY  1993   STONE EXTRACTION WITH BASKET Right 09/17/2016   Procedure: STONE EXTRACTION WITH BASKET;  Surgeon: Morene LELON Salines, MD;  Location: [facility];  Service: Urology;  Laterality: Right;   TOTAL ABDOMINAL HYSTERECTOMY  1971   w/ Bilateral Salpingoophorectomy   TRANSTHORACIC ECHOCARDIOGRAM  10/24/2014   dr Keidan Aumiller  hypokinesis of the basal and mid inferoseptal and inferior walls,  ef 40-45%,  grade 1 diastolic dysfunction/  mechanical bileaflet aortic valve noted w/ mild regur., peak grandient , valve area 1.35cm^2/  severe MV calcification without stenosis w/ mild regurg., peak gradient 30mmHg/  mild LAE/  poss. atrial mass (MRI showed benign lipomatous hypertrophy of atrial septum) consuello PR/mild TR     Current Outpatient Medications  Medication Sig Dispense Refill   acetaminophen  (TYLENOL ) 325 MG tablet Take 1-2 tablets (325-650 mg total) by mouth every 4 (four) hours as needed for mild pain. (Patient taking differently: Take 500 mg by mouth every 4 (four) hours as needed for mild pain (pain score 1-3).)     carbidopa -levodopa  (SINEMET  IR) 25-250 MG tablet Take 1 tablet by mouth.     Cholecalciferol (VITAMIN D3) 2000 units TABS Take 2,000 Units by mouth daily.      famotidine -calcium carbonate-magnesium hydroxide (PEPCID  COMPLETE) 10-800-165 MG CHEW chewable tablet Chew 1 tablet by mouth daily as needed (heartburn).     ferrous sulfate  325 (65 FE) MG EC tablet Take 325 mg by mouth 3 (three)  times a week. Mon Wed Fri     Fish Oil-Krill Oil (SCHIFF KRILL & FISH OIL BLEND) CAPS Take 1 capsule by mouth daily at 12 noon.     furosemide  (LASIX ) 20 MG tablet Take 1 tablet (20 mg total) by mouth daily. 90 tablet 3   melatonin 5 MG TABS Take 5 mg by mouth at bedtime.     methocarbamol  (ROBAXIN ) 500 MG tablet Take 500 mg by mouth.     metoprolol  tartrate (LOPRESSOR ) 25 MG tablet Take 1 tablet (25 mg total) by mouth as needed. For heart rate over 110. Make take up to 2 at least 1 hour apart. 30 tablet 2   Multiple Vitamin (MULTIVITAMIN) tablet Take 1 tablet by mouth daily at 12 noon.     Multiple Vitamins-Minerals (PRESERVISION AREDS 2) CAPS Take 1 capsule by mouth 2 (two) times daily.     naphazoline-glycerin  (CLEAR EYES REDNESS) 0.012-0.25 % SOLN Place 1-2 drops into both eyes 4 (four) times daily as needed for eye irritation.     nitrofurantoin, macrocrystal-monohydrate, (MACROBID) 100 MG capsule Take 100 mg by mouth 2 (two) times daily.     NON FORMULARY Robitussin Dm half of a cap full     OVER THE COUNTER MEDICATION Take 1 capsule by mouth daily. Neuriva     pantoprazole  (PROTONIX ) 40 MG tablet Take 40 mg by mouth daily.     potassium chloride  (KLOR-CON ) 8 MEQ tablet Take 16 mEq by mouth daily.     Probiotic Product (PROBIOTIC PO) Take 1 capsule by mouth daily.     simvastatin  (ZOCOR ) 20 MG tablet Take 20 mg by mouth every evening.     tamsulosin  (FLOMAX ) 0.4 MG CAPS capsule      traMADol  (ULTRAM ) 50 MG tablet Take 50 mg by mouth.     vitamin B-12 (CYANOCOBALAMIN) 1000 MCG tablet Take 1,000 mcg by mouth daily.     warfarin (COUMADIN ) 5 MG tablet 5 mg daily on Monday, Wednesday, Friday and Sunday 2.5 mg daily on Tuesday, Thursday and Saturday (Patient taking differently: 2.5mg  Wed, 5 mg other days)     No current facility-administered medications for this visit.    Allergies:   Demerol  [meperidine ], Oxycodone , Adhesive [tape], Alendronate sodium, Cozaar [losartan], Fosamax  [alendronate], Ivp dye [iodinated contrast media], Losartan potassium, Meperidine  hcl, Oxycodone  hcl, and Phenergan  [promethazine ]    Social  History:  The patient  reports that she has never smoked. She has never used smokeless tobacco. She reports that she does not drink alcohol  and does not use drugs.   Family History:  The patient's family history includes Healthy in her child; Heart disease in her mother; Heart disease (age of onset: 49) in her father; Other in her brother.    ROS:  Please see the history of present illness.   Otherwise, review of systems are positive for none.   All other systems are reviewed and negative.    PHYSICAL EXAM: VS:  There were no vitals taken for this visit. , BMI There is no height or weight on file to calculate BMI. GEN: Well nourished, well developed, in no acute distress  HEENT: normal  Neck: no JVD, carotid bruits, or masses Cardiac: RRR; good mechanical AV click. SEM RUSB Respiratory:  clear to auscultation bilaterally, normal work of breathing GI: soft, nontender, nondistended, + BS MS: no deformity or atrophy  Skin: warm and dry, no rash Neuro:  Strength and sensation are intact Psych: euthymic mood, full affect   EKG:  EKG is not ordered today. The ekg ordered today demonstrates N/A   Recent Labs: 11/30/2024: BUN 38; Creatinine, Ser 2.07; Hemoglobin 11.0; Platelets 181; Potassium 4.3; Sodium 141    Lipid Panel    Component Value Date/Time   CHOL 182 04/01/2020 0849   TRIG 92 04/01/2020 0849   HDL 89 04/01/2020 0849   CHOLHDL 2.0 04/01/2020 0849   CHOLHDL 1.9 09/30/2011 0415   VLDL 8 09/30/2011 0415   LDLCALC 77 04/01/2020 0849    Dated 06/12/19: cholesterol 147, triglycerides 74, HDL 64, LDL 68.   Wt Readings from Last 3 Encounters:  11/06/24 117 lb 3.2 oz (53.2 kg)  10/25/24 116 lb 9.6 oz (52.9 kg)  04/25/24 112 lb (50.8 kg)      Other studies Reviewed: Additional studies/ records that were reviewed today include:    11/17/18: TTE Study Conclusions - Left ventricle: The cavity size was normal. Wall thickness was   normal. Systolic function was mildly reduced. The estimated   ejection fraction was in the range of 45% to 50%. Features are   consistent with a pseudonormal left ventricular filling pattern,   with concomitant abnormal relaxation and increased filling   pressure (grade 2 diastolic dysfunction). - Aortic valve: A mechanical prosthesis was present. There was mild   regurgitation. Valve area (VTI): 1.43 cm^2. Valve area (Vmax):   1.28 cm^2. Valve area (Vmean): 1.2 cm^2. - Mitral valve: Mildly to moderately calcified annulus. Valve area   by continuity equation (using LVOT flow): 1.55 cm^2. - Left atrium: The atrium was mildly dilated.     09/21/18 stress myoview  IMPRESSION: 1. No evidence reversible ischemia. Fixed defect in the septal wall with differential including infarction versus artifact from LEFT bundle branch block. 2. Septal dyskinesia in patient with LEFT bundle branch block. 3. Left ventricular ejection fraction 53% 4. Non invasive risk stratification*: Low  Echo 06/20/20: IMPRESSIONS     1. Moderate global reduction in LV systolic function; s/p AVR with mild  AI and mean gradient 12 mmHg; biatrial enlargement; mild TR.   2. Left ventricular ejection fraction, by estimation, is 30 to 35%. The  left ventricle has moderately decreased function. The left ventricle  demonstrates global hypokinesis. Left ventricular diastolic parameters are  consistent with Grade II diastolic  dysfunction (pseudonormalization).   3. Right ventricular systolic function is normal. The right ventricular  size  is normal. Tricuspid regurgitation signal is inadequate for assessing  PA pressure.   4. Left atrial size was severely dilated.   5. Right atrial size was mildly dilated.   6. The mitral valve is normal in structure. Trivial mitral valve  regurgitation. No evidence of mitral stenosis.    7. The aortic valve has been repaired/replaced. Aortic valve  regurgitation is mild. No aortic stenosis is present. There is a unknown  St. Jude mechanical valve present in the aortic position. Procedure Date:  04/13/2006.    ASSESSMENT AND PLAN:  1. Chronic combined systolic/diastolic CHF. EF down to 30-35%. Will continue lasix  20 mg daily. Intolerant of Entresto  with hypotension. intolerant of metoprolol  due to bradycardia. Continue Sodium restriction.   2.  Persistent AFib.  no rate control medication needed.  On  anticoagulation with coumadin . INR therapeutic.    2.  S/P SJM AVR:  Stable on echo in September 2019.  continue Chronic coumadin . SBE prophylaxis. Good valve click on exam.    3.  HLD:  well controlled. Cont statin.   4. CKD stage 4    Disposition:   FU with me in 6 months  Signed, Shephanie Romas, MD  01/31/2025 7:33 AM    [facility] [address] [place], [place], [address] Phone [phone], Fax [phone] "

## 2025-02-01 ENCOUNTER — Encounter: Payer: Self-pay | Admitting: Cardiology

## 2025-02-01 ENCOUNTER — Ambulatory Visit: Admitting: Cardiology

## 2025-02-01 VITALS — BP 100/58 | HR 93 | Resp 18 | Ht 59.0 in | Wt 115.0 lb

## 2025-02-01 DIAGNOSIS — I4821 Permanent atrial fibrillation: Secondary | ICD-10-CM

## 2025-02-01 DIAGNOSIS — Z7901 Long term (current) use of anticoagulants: Secondary | ICD-10-CM

## 2025-02-01 MED ORDER — FUROSEMIDE 20 MG PO TABS: 20.0000 mg | ORAL_TABLET | Freq: Every day | ORAL | 3 refills | Status: AC

## 2025-02-01 NOTE — Patient Instructions (Signed)
 Medication Instructions:  Continue same medications *If you need a refill on your cardiac medications before your next appointment, please call your pharmacy*  Lab Work: None ordered  Testing/Procedures: None ordered  Follow-Up: At [facility], you and your health needs are our priority.  As part of our continuing mission to provide you with exceptional heart care, our providers are all part of one team.  This team includes your primary Cardiologist (physician) and Advanced Practice Providers or APPs (Physician Assistants and Nurse Practitioners) who all work together to provide you with the care you need, when you need it.  Your next appointment:  6 months   Cal in May to schedule August appointment     Provider:  Dr.Jordan   We recommend signing up for the patient portal called MyChart.  Sign up information is provided on this After Visit Summary.  MyChart is used to connect with patients for Virtual Visits (Telemedicine).  Patients are able to view lab/test results, encounter notes, upcoming appointments, etc.  Non-urgent messages can be sent to your provider as well.   To learn more about what you can do with MyChart, go to [email or web address].

## 2025-11-07 ENCOUNTER — Ambulatory Visit: Admitting: Neurology
